# Patient Record
Sex: Female | Born: 1945 | Race: White | Hispanic: No | Marital: Married | Smoking: Former smoker
Health system: Southern US, Community
[De-identification: ages and names within clinical notes are randomized; demographics above are authoritative.]

## PROBLEM LIST (undated history)

## (undated) DIAGNOSIS — I2119 ST elevation (STEMI) myocardial infarction involving other coronary artery of inferior wall: Secondary | ICD-10-CM

## (undated) DIAGNOSIS — I252 Old myocardial infarction: Secondary | ICD-10-CM

## (undated) DIAGNOSIS — C3490 Malignant neoplasm of unspecified part of unspecified bronchus or lung: Secondary | ICD-10-CM

## (undated) DIAGNOSIS — I10 Essential (primary) hypertension: Secondary | ICD-10-CM

## (undated) DIAGNOSIS — E7800 Pure hypercholesterolemia, unspecified: Secondary | ICD-10-CM

## (undated) DIAGNOSIS — I2510 Atherosclerotic heart disease of native coronary artery without angina pectoris: Secondary | ICD-10-CM

## (undated) DIAGNOSIS — F40240 Claustrophobia: Secondary | ICD-10-CM

## (undated) DIAGNOSIS — J189 Pneumonia, unspecified organism: Secondary | ICD-10-CM

## (undated) DIAGNOSIS — K219 Gastro-esophageal reflux disease without esophagitis: Secondary | ICD-10-CM

## (undated) HISTORY — PX: BLADDER NECK SUSPENSION: SHX1240

## (undated) HISTORY — PX: LAPAROTOMY: SHX154

## (undated) HISTORY — DX: ST elevation (STEMI) myocardial infarction involving other coronary artery of inferior wall: I21.19

## (undated) HISTORY — PX: CORONARY STENT PLACEMENT: SHX1402

## (undated) HISTORY — DX: Malignant neoplasm of unspecified part of unspecified bronchus or lung: C34.90

## (undated) HISTORY — PX: APPENDECTOMY: SHX54

## (undated) HISTORY — PX: HERNIA REPAIR: SHX51

## (undated) SURGERY — Surgical Case
Anesthesia: *Unknown

---

## 2010-12-21 HISTORY — PX: HERNIA REPAIR: SHX51

## 2014-09-29 ENCOUNTER — Encounter (HOSPITAL_COMMUNITY)
Admission: EM | Disposition: A | Discharge status: Home / Self Care | Payer: Self-pay | Source: Ambulatory Visit | Attending: Cardiology

## 2014-09-29 ENCOUNTER — Inpatient Hospital Stay (HOSPITAL_COMMUNITY)
Admission: EM | Admit: 2014-09-29 | Discharge: 2014-10-02 | DRG: 247 | Disposition: A | Discharge status: Home / Self Care | Payer: Medicare HMO | Source: Ambulatory Visit | Attending: Cardiology | Admitting: Cardiology

## 2014-09-29 ENCOUNTER — Emergency Department: Payer: Self-pay | Admitting: Emergency Medicine

## 2014-09-29 DIAGNOSIS — E780 Pure hypercholesterolemia: Secondary | ICD-10-CM | POA: Diagnosis present

## 2014-09-29 DIAGNOSIS — I2119 ST elevation (STEMI) myocardial infarction involving other coronary artery of inferior wall: Secondary | ICD-10-CM | POA: Diagnosis present

## 2014-09-29 DIAGNOSIS — I2111 ST elevation (STEMI) myocardial infarction involving right coronary artery: Principal | ICD-10-CM | POA: Diagnosis present

## 2014-09-29 DIAGNOSIS — F17210 Nicotine dependence, cigarettes, uncomplicated: Secondary | ICD-10-CM | POA: Diagnosis present

## 2014-09-29 DIAGNOSIS — I2510 Atherosclerotic heart disease of native coronary artery without angina pectoris: Secondary | ICD-10-CM | POA: Diagnosis present

## 2014-09-29 DIAGNOSIS — Z955 Presence of coronary angioplasty implant and graft: Secondary | ICD-10-CM

## 2014-09-29 HISTORY — PX: LEFT HEART CATHETERIZATION WITH CORONARY ANGIOGRAM: SHX5451

## 2014-09-29 HISTORY — PX: CORONARY ANGIOPLASTY: SHX604

## 2014-09-29 SURGERY — LEFT HEART CATHETERIZATION WITH CORONARY ANGIOGRAM
Anesthesia: LOCAL

## 2014-09-30 ENCOUNTER — Encounter (HOSPITAL_COMMUNITY): Payer: Self-pay | Admitting: *Deleted

## 2014-09-30 DIAGNOSIS — I2510 Atherosclerotic heart disease of native coronary artery without angina pectoris: Secondary | ICD-10-CM | POA: Diagnosis present

## 2014-09-30 DIAGNOSIS — E780 Pure hypercholesterolemia: Secondary | ICD-10-CM | POA: Diagnosis present

## 2014-09-30 DIAGNOSIS — R079 Chest pain, unspecified: Secondary | ICD-10-CM | POA: Diagnosis present

## 2014-09-30 DIAGNOSIS — I2111 ST elevation (STEMI) myocardial infarction involving right coronary artery: Secondary | ICD-10-CM | POA: Diagnosis present

## 2014-09-30 DIAGNOSIS — I2119 ST elevation (STEMI) myocardial infarction involving other coronary artery of inferior wall: Secondary | ICD-10-CM | POA: Diagnosis present

## 2014-09-30 DIAGNOSIS — F17210 Nicotine dependence, cigarettes, uncomplicated: Secondary | ICD-10-CM | POA: Diagnosis present

## 2014-09-30 HISTORY — DX: ST elevation (STEMI) myocardial infarction involving other coronary artery of inferior wall: I21.19

## 2014-09-30 LAB — CBC
HEMATOCRIT: 36 % (ref 36.0–46.0)
HEMATOCRIT: 37.3 % (ref 36.0–46.0)
Hemoglobin: 12.3 g/dL (ref 12.0–15.0)
Hemoglobin: 12.6 g/dL (ref 12.0–15.0)
MCH: 30.3 pg (ref 26.0–34.0)
MCH: 30.4 pg (ref 26.0–34.0)
MCHC: 33.8 g/dL (ref 30.0–36.0)
MCHC: 34.2 g/dL (ref 30.0–36.0)
MCV: 89.1 fL (ref 78.0–100.0)
MCV: 89.7 fL (ref 78.0–100.0)
PLATELETS: 191 10*3/uL (ref 150–400)
Platelets: 210 10*3/uL (ref 150–400)
RBC: 4.04 MIL/uL (ref 3.87–5.11)
RBC: 4.16 MIL/uL (ref 3.87–5.11)
RDW: 13.4 % (ref 11.5–15.5)
RDW: 13.5 % (ref 11.5–15.5)
WBC: 11.3 10*3/uL — ABNORMAL HIGH (ref 4.0–10.5)
WBC: 9.6 10*3/uL (ref 4.0–10.5)

## 2014-09-30 LAB — COMPREHENSIVE METABOLIC PANEL
ALBUMIN: 3.4 g/dL — AB (ref 3.5–5.2)
ALT: 11 U/L (ref 0–35)
ALT: 9 U/L (ref 0–35)
ANION GAP: 14 (ref 5–15)
AST: 12 U/L (ref 0–37)
AST: 17 U/L (ref 0–37)
Albumin: 3.1 g/dL — ABNORMAL LOW (ref 3.5–5.2)
Alkaline Phosphatase: 81 U/L (ref 39–117)
Alkaline Phosphatase: 88 U/L (ref 39–117)
Anion gap: 16 — ABNORMAL HIGH (ref 5–15)
BUN: 13 mg/dL (ref 6–23)
BUN: 14 mg/dL (ref 6–23)
CHLORIDE: 102 meq/L (ref 96–112)
CO2: 20 meq/L (ref 19–32)
CO2: 21 meq/L (ref 19–32)
Calcium: 8.2 mg/dL — ABNORMAL LOW (ref 8.4–10.5)
Calcium: 8.5 mg/dL (ref 8.4–10.5)
Chloride: 102 mEq/L (ref 96–112)
Creatinine, Ser: 0.74 mg/dL (ref 0.50–1.10)
Creatinine, Ser: 0.72 mg/dL (ref 0.50–1.10)
GFR calc Af Amer: >90 mL/min (ref >90)
GFR calc Af Amer: >90 mL/min (ref >90)
GFR calc non Af Amer: 87 mL/min — ABNORMAL LOW (ref >90)
GFR, EST NON AFRICAN AMERICAN: 86 mL/min — AB (ref >90)
Glucose, Bld: 111 mg/dL — ABNORMAL HIGH (ref 70–99)
Glucose, Bld: 109 mg/dL — ABNORMAL HIGH (ref 70–99)
POTASSIUM: 4 meq/L (ref 3.7–5.3)
Potassium: 3.9 mEq/L (ref 3.7–5.3)
SODIUM: 138 meq/L (ref 137–147)
Sodium: 137 mEq/L (ref 137–147)
Total Protein: 6.3 g/dL (ref 6.0–8.3)
Total Protein: 6.8 g/dL (ref 6.0–8.3)

## 2014-09-30 LAB — CBC WITH DIFFERENTIAL/PLATELET
Basophils Absolute: 0 10*3/uL (ref 0.0–0.1)
Basophils Relative: 0 % (ref 0–1)
Eosinophils Absolute: 0.1 10*3/uL (ref 0.0–0.7)
Eosinophils Relative: 1 % (ref 0–5)
HEMATOCRIT: 37.8 % (ref 36.0–46.0)
HEMOGLOBIN: 12.6 g/dL (ref 12.0–15.0)
LYMPHS PCT: 21 % (ref 12–46)
Lymphs Abs: 2.3 10*3/uL (ref 0.7–4.0)
MCH: 30.6 pg (ref 26.0–34.0)
MCHC: 33.3 g/dL (ref 30.0–36.0)
MCV: 91.7 fL (ref 78.0–100.0)
MONO ABS: 0.8 10*3/uL (ref 0.1–1.0)
Monocytes Relative: 7 % (ref 3–12)
NEUTROS PCT: 71 % (ref 43–77)
Neutro Abs: 7.9 10*3/uL — ABNORMAL HIGH (ref 1.7–7.7)
Platelets: 229 10*3/uL (ref 150–400)
RBC: 4.12 MIL/uL (ref 3.87–5.11)
RDW: 13.5 % (ref 11.5–15.5)
WBC: 11.2 10*3/uL — AB (ref 4.0–10.5)

## 2014-09-30 LAB — HEMOGLOBIN A1C
Hgb A1c MFr Bld: 5.7 % — ABNORMAL HIGH (ref <5.7)
Mean Plasma Glucose: 117 mg/dL — ABNORMAL HIGH (ref <117)

## 2014-09-30 LAB — BASIC METABOLIC PANEL
Anion gap: 13 (ref 5–15)
BUN: 10 mg/dL (ref 6–23)
CHLORIDE: 107 meq/L (ref 96–112)
CO2: 19 meq/L (ref 19–32)
CREATININE: 0.64 mg/dL (ref 0.50–1.10)
Calcium: 8.3 mg/dL — ABNORMAL LOW (ref 8.4–10.5)
GFR calc Af Amer: >90 mL/min (ref >90)
GFR calc non Af Amer: >90 mL/min (ref >90)
Glucose, Bld: 100 mg/dL — ABNORMAL HIGH (ref 70–99)
Potassium: 4 mEq/L (ref 3.7–5.3)
Sodium: 139 mEq/L (ref 137–147)

## 2014-09-30 LAB — PROTIME-INR
INR: 4.94 — ABNORMAL HIGH (ref 0.00–1.49)
Prothrombin Time: 46 seconds — ABNORMAL HIGH (ref 11.6–15.2)

## 2014-09-30 LAB — LIPID PANEL
CHOL/HDL RATIO: 5.1 ratio
CHOLESTEROL: 211 mg/dL — AB (ref 0–200)
Cholesterol: 200 mg/dL (ref 0–200)
HDL: 41 mg/dL (ref >39)
HDL: 41 mg/dL (ref >39)
LDL CALC: 137 mg/dL — AB (ref 0–99)
LDL Cholesterol: 143 mg/dL — ABNORMAL HIGH (ref 0–99)
Total CHOL/HDL Ratio: 4.9 RATIO
Triglycerides: 109 mg/dL (ref <150)
Triglycerides: 137 mg/dL (ref <150)
VLDL: 22 mg/dL (ref 0–40)
VLDL: 27 mg/dL (ref 0–40)

## 2014-09-30 LAB — MRSA PCR SCREENING: MRSA BY PCR: NEGATIVE

## 2014-09-30 LAB — TSH: TSH: 1.32 u[IU]/mL (ref 0.350–4.500)

## 2014-09-30 LAB — POCT ACTIVATED CLOTTING TIME: Activated Clotting Time: 135 seconds

## 2014-09-30 LAB — TROPONIN I
TROPONIN I: 1.79 ng/mL — AB (ref <0.30)
TROPONIN I: 2.74 ng/mL — AB (ref <0.30)
Troponin I: 0.34 ng/mL (ref <0.30)
Troponin I: 1.6 ng/mL (ref <0.30)

## 2014-09-30 LAB — CK TOTAL AND CKMB (NOT AT ARMC)
CK, MB: 2.5 ng/mL (ref 0.3–4.0)
Relative Index: INVALID (ref 0.0–2.5)
Total CK: 41 U/L (ref 7–177)

## 2014-09-30 LAB — MAGNESIUM: Magnesium: 1.9 mg/dL (ref 1.5–2.5)

## 2014-09-30 MED ORDER — TICAGRELOR 90 MG PO TABS
90.0000 mg | ORAL_TABLET | Freq: Two times a day (BID) | ORAL | Status: DC
  Administered 2014-09-30 – 2014-10-02 (×5): 90 mg via ORAL
  Filled 2014-09-30 (×8): qty 1

## 2014-09-30 MED ORDER — NITROGLYCERIN IN D5W 200-5 MCG/ML-% IV SOLN: 5.0000 ug/min | INTRAVENOUS | Status: DC

## 2014-09-30 MED ORDER — ASPIRIN EC 81 MG PO TBEC: 81.0000 mg | DELAYED_RELEASE_TABLET | Freq: Every day | ORAL | Status: DC

## 2014-09-30 MED ORDER — NITROGLYCERIN 0.4 MG SL SUBL: 0.4000 mg | SUBLINGUAL_TABLET | SUBLINGUAL | Status: DC | PRN

## 2014-09-30 MED ORDER — PNEUMOCOCCAL VAC POLYVALENT 25 MCG/0.5ML IJ INJ
0.5000 mL | INJECTION | Freq: Once | INTRAMUSCULAR | Status: AC
  Administered 2014-09-30: 0.5 mL via INTRAMUSCULAR
  Filled 2014-09-30: qty 0.5

## 2014-09-30 MED ORDER — METOPROLOL TARTRATE 12.5 MG HALF TABLET
12.5000 mg | ORAL_TABLET | Freq: Two times a day (BID) | ORAL | Status: DC
  Administered 2014-09-30 – 2014-10-02 (×4): 12.5 mg via ORAL
  Filled 2014-09-30 (×7): qty 1

## 2014-09-30 MED ORDER — ATROPINE SULFATE 0.1 MG/ML IJ SOLN
INTRAMUSCULAR | Status: AC
  Filled 2014-09-30: qty 10

## 2014-09-30 MED ORDER — ACETAMINOPHEN 325 MG PO TABS: 650.0000 mg | ORAL_TABLET | ORAL | Status: DC | PRN

## 2014-09-30 MED ORDER — ONDANSETRON HCL 4 MG/2ML IJ SOLN
4.0000 mg | Freq: Four times a day (QID) | INTRAMUSCULAR | Status: DC | PRN
Start: 2014-09-30 — End: 2014-09-30

## 2014-09-30 MED ORDER — ASPIRIN 81 MG PO CHEW
81.0000 mg | CHEWABLE_TABLET | Freq: Every day | ORAL | Status: DC
Start: 2014-09-30 — End: 2014-10-02
  Administered 2014-09-30 – 2014-10-02 (×3): 81 mg via ORAL
  Filled 2014-09-30 (×3): qty 1

## 2014-09-30 MED ORDER — SODIUM CHLORIDE 0.9 % IV SOLN
INTRAVENOUS | Status: AC
  Administered 2014-09-30 (×2): via INTRAVENOUS

## 2014-09-30 MED ORDER — ONDANSETRON HCL 4 MG/2ML IJ SOLN
4.0000 mg | Freq: Four times a day (QID) | INTRAMUSCULAR | Status: DC | PRN
Start: 2014-09-30 — End: 2014-10-02

## 2014-09-30 MED ORDER — ASPIRIN 300 MG RE SUPP
300.0000 mg | RECTAL | Status: AC
  Filled 2014-09-30: qty 1

## 2014-09-30 MED ORDER — ASPIRIN 81 MG PO CHEW: 324.0000 mg | CHEWABLE_TABLET | ORAL | Status: AC

## 2014-09-30 MED ORDER — ACETAMINOPHEN 325 MG PO TABS
650.0000 mg | ORAL_TABLET | ORAL | Status: DC | PRN
  Administered 2014-09-30: 650 mg via ORAL
  Filled 2014-09-30: qty 2

## 2014-09-30 MED ORDER — PANTOPRAZOLE SODIUM 40 MG PO TBEC
40.0000 mg | DELAYED_RELEASE_TABLET | Freq: Every day | ORAL | Status: DC
Start: 2014-09-30 — End: 2014-10-02
  Administered 2014-09-30 – 2014-10-02 (×3): 40 mg via ORAL
  Filled 2014-09-30 (×3): qty 1

## 2014-09-30 MED ORDER — ATORVASTATIN CALCIUM 80 MG PO TABS
80.0000 mg | ORAL_TABLET | Freq: Every day | ORAL | Status: DC
  Administered 2014-09-30 – 2014-10-01 (×2): 80 mg via ORAL
  Filled 2014-09-30 (×3): qty 1

## 2014-09-30 NOTE — Progress Notes (Signed)
Utilization Review Completed.   Trust Crago, RN, BSN Nurse Case Manager  

## 2014-09-30 NOTE — H&P (Signed)
Kristi [name] is an 68 y.o. female.   Chief Complaint: Chest pain associated with diaphoresis HPI: Patient is 68 year old female with no significant past medical history except for tobacco abuse was transferred from [facility] ER as code STEMI  was called. Patient complained of retrosternal chest pain described as pressure grade 8/10 associated diaphoresis he done the ER showed normal sinus rhythm with ST elevation in leads 23 aVF and ST depression in lead V1 to V3 and respiratory changes in lead 1 and aVL suggestive of acute inferoposterior wall myocardial infarction. Patient denies such episodes of chest pain in the past denies any shortness of breath. Denies palpitation lightheadedness or syncope. Patient was transferred emergently [facility] Cath Lab for emergency PCI.  No past medical history on file.  No past surgical history on file.  No family history on file. Social History:  has no tobacco, alcohol, and drug history on file.  Allergies: Allergies not on file  No prescriptions prior to admission    No results found for this or any previous visit (from the past 48 hour(s)). No results found.  Review of Systems  Constitutional: Negative for fever and chills.  HENT: Negative for hearing loss.   Eyes: Negative for double vision.  Respiratory: Negative for cough, hemoptysis, sputum production and shortness of breath.   Cardiovascular: Positive for chest pain. Negative for palpitations, orthopnea, claudication and leg swelling.  Gastrointestinal: Negative for nausea, vomiting and abdominal pain.  Genitourinary: Negative for dysuria.  Neurological: Negative for dizziness and headaches.    There were no vitals taken for this visit. Physical Exam  Constitutional: She is oriented to person, place, and time.  HENT:  Head: Normocephalic and atraumatic.  Eyes: Conjunctivae are normal. Pupils are equal, round, and reactive to light. Left eye exhibits no  discharge. No scleral icterus.  Neck: Normal range of motion. Neck supple. No JVD present. No tracheal deviation present. No thyromegaly present.  Cardiovascular: Normal rate and regular rhythm.   Murmur (soft systolic murmur and S4 gallop noted) heard. Respiratory: Effort normal and breath sounds normal. No respiratory distress. She has no wheezes. She has no rales.  GI: Soft. Bowel sounds are normal. She exhibits no distension. There is no tenderness.  Musculoskeletal: She exhibits no edema and no tenderness.  Neurological: She is alert and oriented to person, place, and time.     Assessment/Plan Acute inferoposterior wall myocardial infarction Tobacco abuse Plan Discussed with patient briefly regarding emergency left cath possible PTCA stenting this risk and benefits and consents for PCI  [facility] N 09/30/2014, 12:39 AM

## 2014-09-30 NOTE — Cardiovascular Report (Signed)
NAME:  Mccaffery, Nadezhda              ACCOUNT NO.:  [id number]  MEDICAL RECORD NO.:  [id number]  LOCATION:  2H05C                        FACILITY:  [facility]  PHYSICIAN:  Carmon Brigandi N. Terrence Dupont, M.D. DATE OF BIRTH:  [birth year]  DATE OF PROCEDURE:  09/28/2014 DATE OF DISCHARGE:                           CARDIAC CATHETERIZATION   PROCEDURE: 1. Left cardiac catheterization with selective left and right coronary     angiography, left ventriculography via right groin using [place]     technique. 2. Successful percutaneous transluminal coronary angioplasty to distal     right coronary artery using 2.5 x 8 mm long Emerge balloon for     predilatation. 3. Successful deployment of 2.75 x 15 mm long Xience Alpine drug-     eluting stent in distal right coronary artery. 4. Successful postdilatation of this stent using 2.75 x 12 mm long [place]     Emerge balloon.  INDICATION FOR THE PROCEDURE:  Ms. Kristi [name] is a 68 year old female with no significant past medical history except for tobacco abuse who was transferred from [facility] ER as code STEMI was called. The patient complained of retrosternal chest pain described as pressure, grade 8/10 associated with diaphoresis around 10:15 p.m.  EKG done in the ER showed normal sinus rhythm with ST elevation in leads 2, 3, aVF and ST depression in V1 to V3, and reciprocal changes in lead 1 and aVL suggestive of acute inferoposterior wall myocardial infarction.  The patient denies such episodes of chest pain in the past.  Denies any shortness of breath.  Denies palpitation, lightheadedness, or syncope. The patient was transferred emergently to [facility] Cath Lab for emergency PCI.  DESCRIPTION OF PROCEDURE:  After obtaining the informed consent, the patient was brought to the cath lab and was placed on fluoroscopy table. Right groin was prepped and draped in usual fashion.  A 1% Xylocaine was used for local anesthesia in the right groin.   With the help of thin wall needle, 6-French arterial sheath was placed.  The sheath was aspirated and flushed.  Next, 6-French left [place] catheter was advanced over the wire under fluoroscopic guidance up to the ascending aorta.  Wire was pulled out.  The catheter was aspirated and connected to the Manifold.  Catheter was further advanced and engaged into left coronary ostium.  Multiple views of the left system were taken.  Next, catheter was disengaged and was pulled out over the wire and was replaced with a 6-French right [place] catheter, which was advanced over the wire under fluoroscopic guidance up to the ascending aorta.  Wire was pulled out.  The catheter was aspirated and connected to the Manifold.  Catheter was further advanced and engaged into right coronary ostium.  Multiple views of the right system were taken.  Next, the catheter was disengaged and was pulled out over the wire and was replaced with 6-French pigtail catheter at the end of the procedure, which was advanced over the wire under fluoroscopic guidance up to the ascending aorta.  Catheter was further advanced across the aortic valve into the LV.  LV pressures were recorded.  Next, LV graphy was done in 30-degree RAO position.  Post-angiographic pressures were  recorded from LV and then pullback pressures were recorded from the aorta.  There was no gradient across the aortic valve.  Next, the pigtail catheter was pulled out over the wire.  Sheaths were aspirated and flushed.  FINDINGS:  LV showed mild inferior mid wall hypokinesia, EF of 55-60%. Left main was patent.  LAD has 10-15% proximal and mid stenosis. Diagonal 1 has 30-35% proximal stenosis.  Left circumflex has 60-70% mid focal stenosis and 20%-30% distal stenosis.  OM 1 is small which is patent.  RCA has 40-50% mid stenosis.  Vessel is calcified and has 99% distal stenosis with haziness with TIMI grade 3 distal flow which is the culprit lesion for her MI  just prior to the bifurcation with PDA.  PDA and PLV branches were small which has mild disease.  INTERVENTIONAL PROCEDURE:  Successful PTCA to distal RCA was done using initial 2.5 x 8 mm long Emerge balloon.  Two inflations were done going up to 8 atmospheric pressure for predilatation and then 2.75 x 15 mm long Xience Alpine drug-eluting stent was deployed at 11 atmospheric pressure in distal RCA.  The stent was post dilated using 2.75 x 12 mm long [place] Emerge balloon going up to 18 atmospheric pressure.  Lesion dilated from 99% to 0% residual with excellent TIMI grade 3 distal flow without evidence of dissection or distal embolization.  The patient received weight based Angiomax and 180 mg of Brilinta prior to the procedure.  The patient tolerated the procedure well.  There were no complications.  The patient was transferred to CCU in stable condition.     Allegra Lai. Terrence Dupont, M.D.     MNH/MEDQ  D:  09/30/2014  T:  09/30/2014  Job:  [id number]

## 2014-09-30 NOTE — Progress Notes (Signed)
Pt c/o pain to iv access, site checked soft and sl. tender to touch. Attempt to restart new iv access futile x1.Pt stated she did not want to have another iv access put in. On call MD notified, stated to decrease ivf to 30 and continue for 12 hours as previously written. IVF turned off at 1315 and pt saline locked.  Pt informed of order to transfer to telemetry. Pt verbalized understanding, awaiting room assignment. Will cont to monitor closely.

## 2014-09-30 NOTE — Progress Notes (Signed)
CRITICAL VALUE ALERT  Critical value received:  Troponin 0.34  Date of notification:  09/30/14  Time of notification:  0149  Critical value read back:Yes.    Nurse who received alert:  Chantele Corado A   Pt. Code stemi, went to cath lab. Dr. Terrence Dupont placed stent, aware of pt. Condition.

## 2014-09-30 NOTE — CV Procedure (Signed)
Left cardiac cath/PTCA stenting report dictated on 09/30/2014 dictation number is [id number]

## 2014-09-30 NOTE — Progress Notes (Signed)
R femoral arterial sheath pull started at 0342. Manual pressure held for 30 minutes, pt tolerated procedure with out any adverse events.  Vital signs remained stable.  Groin site level 0, pressure dressing applied and pt given post sheath pull education.

## 2014-09-30 NOTE — Progress Notes (Signed)
CRITICAL VALUE ALERT  Critical value received:  ptt > 200  Date of notification:  09/30/14  Time of notification:  0208  Critical value read back:Yes.    Nurse who received alert:  Deaundra Dupriest A  Pt. Code stemi, went to cath lab. Dr. Terrence Dupont placed stent, aware of pt. Condition. Expected value

## 2014-09-30 NOTE — Progress Notes (Signed)
Rm [id number] available, pt informed. Report called to receiving RN. Pt's belongings packed. Pt transported via wheelchair with belongings and husband in tow. [place] notified. Will monitor.

## 2014-09-30 NOTE — Progress Notes (Signed)
[place] Progress Note Patient Name: Kristi [name] DOB: 03/03/1946 MRN: [id number]   Date of Service  09/30/2014  HPI/Events of Note  Inferior MI, S/P LHC with PCI distal RCA Stable on camera check  eICU Interventions  No intervention needed Will monitor     Intervention Category Evaluation Type: New Patient Evaluation  [name] 09/30/2014, 2:01 AM

## 2014-09-30 NOTE — Progress Notes (Signed)
Chaplain responded to code stemi. Chaplain escorted pt husband to [facility] waiting area and informed medical team of his location. Pt husband explained that they are from [place] and have been staying with their son in [place]. He said "it has been a long month." Chaplain will follow as needed.  09/30/14 0000  Clinical Encounter Type  Visited With Family;Health care provider  Visit Type Initial;Spiritual support  Referral From Nurse  Stress Factors  Family Stress Factors Major life changes  Telly Broberg, Barbette Hair, Chaplain 09/30/2014 12:23 AM

## 2014-09-30 NOTE — Progress Notes (Signed)
Ref: No primary provider on file.   Subjective:  Feeling better. No chest pain.  Objective:  Vital Signs in the last 24 hours: Temp:  [97.6 F (36.4 C)-98.3 F (36.8 C)] 97.6 F (36.4 C) (10/11 1200) Pulse Rate:  [60-106] 64 (10/11 1200) Cardiac Rhythm:  [-] Normal sinus rhythm (10/11 1200) Resp:  [11-25] 15 (10/11 1200) BP: (82-153)/(39-85) 110/57 mmHg (10/11 1200) SpO2:  [90 %-99 %] 99 % (10/11 1200) Weight:  [81.5 kg (179 lb 10.8 oz)] 81.5 kg (179 lb 10.8 oz) (10/11 0100)  Physical Exam: BP Readings from Last 1 Encounters:  09/30/14 110/57    Wt Readings from Last 1 Encounters:  09/30/14 81.5 kg (179 lb 10.8 oz)    Weight change:   HEENT: [place]/AT, Eyes- PERL, EOMI, Conjunctiva-Pink, Sclera-Non-icteric Neck: No JVD, No bruit, Trachea midline. Lungs:  Clear, Bilateral. Cardiac:  Regular rhythm, normal S1 and S2, no S3.  Abdomen:  Soft, non-tender. Extremities:  No edema present. No cyanosis. No clubbing. No right groin hematoma. CNS: AxOx3, Cranial nerves grossly intact, moves all 4 extremities.  Skin: Warm and dry.   Intake/Output from previous day: 10/10 0701 - 10/11 0700 In: 850 [I.V.:600] Out: -     Lab Results: BMET    Component Value Date/Time   NA 139 09/30/2014 0705   NA 138 09/30/2014 0244   NA 137 09/30/2014 0032   K 4.0 09/30/2014 0705   K 4.0 09/30/2014 0244   K 3.9 09/30/2014 0032   CL 107 09/30/2014 0705   CL 102 09/30/2014 0244   CL 102 09/30/2014 0032   CO2 19 09/30/2014 0705   CO2 20 09/30/2014 0244   CO2 21 09/30/2014 0032   GLUCOSE 100* 09/30/2014 0705   GLUCOSE 109* 09/30/2014 0244   GLUCOSE 111* 09/30/2014 0032   BUN 10 09/30/2014 0705   BUN 13 09/30/2014 0244   BUN 14 09/30/2014 0032   CREATININE 0.64 09/30/2014 0705   CREATININE 0.72 09/30/2014 0244   CREATININE 0.74 09/30/2014 0032   CALCIUM 8.3* 09/30/2014 0705   CALCIUM 8.5 09/30/2014 0244   CALCIUM 8.2* 09/30/2014 0032   GFRNONAA >90 09/30/2014 0705   GFRNONAA 87*  09/30/2014 0244   GFRNONAA 86* 09/30/2014 0032   GFRAA >90 09/30/2014 0705   GFRAA >90 09/30/2014 0244   GFRAA >90 09/30/2014 0032   CBC    Component Value Date/Time   WBC 9.6 09/30/2014 0705   RBC 4.16 09/30/2014 0705   HGB 12.6 09/30/2014 0705   HCT 37.3 09/30/2014 0705   PLT 191 09/30/2014 0705   MCV 89.7 09/30/2014 0705   MCH 30.3 09/30/2014 0705   MCHC 33.8 09/30/2014 0705   RDW 13.5 09/30/2014 0705   LYMPHSABS 2.3 09/30/2014 0244   MONOABS 0.8 09/30/2014 0244   EOSABS 0.1 09/30/2014 0244   BASOSABS 0.0 09/30/2014 0244   HEPATIC Function Panel  Recent Labs  09/30/14 0032 09/30/14 0244  PROT 6.3 6.8   HEMOGLOBIN A1C No components found with this basename: HGA1C,  MPG   CARDIAC ENZYMES Lab Results  Component Value Date   CKTOTAL 41 09/30/2014   CKMB 2.5 09/30/2014   TROPONINI 1.79* 09/30/2014   TROPONINI 1.60* 09/30/2014   TROPONINI 0.34* 09/30/2014   BNP No results found for this basename: PROBNP,  in the last 8760 hours TSH  Recent Labs  09/30/14 0244  TSH 1.320   CHOLESTEROL  Recent Labs  09/30/14 0032 09/30/14 0244  CHOL 200 211*    Scheduled Meds: . aspirin  324 mg Oral NOW   Or  . aspirin  300 mg Rectal NOW  . aspirin  81 mg Oral Daily  . atorvastatin  80 mg Oral q1800  . metoprolol tartrate  12.5 mg Oral BID  . pantoprazole  40 mg Oral Q0600  . ticagrelor  90 mg Oral BID   Continuous Infusions: . nitroGLYCERIN     PRN Meds:.acetaminophen, nitroGLYCERIN, ondansetron (ZOFRAN) IV  Assessment/Plan: Acute inferior-posterior wall MI Tobacco use disorder CAD S/P RCA Stent    Transfer to Telebed   LOS: 1 day    Dixie Dials  MD  09/30/2014, 1:03 PM

## 2014-10-01 LAB — POCT I-STAT, CHEM 8
BUN: 13 mg/dL (ref 6–23)
CALCIUM ION: 1.14 mmol/L (ref 1.13–1.30)
Chloride: 105 mEq/L (ref 96–112)
Creatinine, Ser: 0.7 mg/dL (ref 0.50–1.10)
Glucose, Bld: 113 mg/dL — ABNORMAL HIGH (ref 70–99)
HCT: 35 % — ABNORMAL LOW (ref 36.0–46.0)
HEMOGLOBIN: 11.9 g/dL — AB (ref 12.0–15.0)
Potassium: 3.6 mEq/L — ABNORMAL LOW (ref 3.7–5.3)
Sodium: 139 mEq/L (ref 137–147)
TCO2: 21 mmol/L (ref 0–100)

## 2014-10-01 LAB — APTT: aPTT: >200 seconds (ref 24–37)

## 2014-10-01 NOTE — Care Management Note (Addendum)
    Page 1 of 2   10/02/2014     11:10:30 AM CARE MANAGEMENT NOTE 10/02/2014  Patient:  Kristi [name],Kristi [name]   Account Number:  [id number]  Date Initiated:  10/01/2014  Documentation initiated by:  GRAVES-BIGELOW,Vera Wishart  Subjective/Objective Assessment:   Pt admitted for stemi. Plan for d/c on Brilinta.     Action/Plan:   Pt was provided 30 day free card. CM did call [facility] in [place] [facility]d medication is available. [facility] checked co pay: insurance rejected to pay and cost will be 350.00. CM to ask CMA to do benefits check.   Anticipated DC Date:  10/01/2014   Anticipated DC Plan:  [place]  CM consult  Medication Assistance  PCP issues      Choice offered to / List presented to:             Status of service:  Completed, signed off Medicare Important Message given?  YES (If response is "NO", the following Medicare IM given date fields will be blank) Date Medicare IM given:  10/02/2014 Medicare IM given by:  GRAVES-BIGELOW,Younique Casad Date Additional Medicare IM given:   Additional Medicare IM given by:    Discharge Disposition:  HOME/SELF CARE  Per UR Regulation:  Reviewed for med. necessity/level of care/duration of stay  If discussed at [place] of Stay Meetings, dates discussed:    Comments:  S/W JACQUELINE @ [place] # [phone]  ** TRICAGRELOR: NOT ON FORMULARY **  BRILINTA : 90 MG TABLET  BID  COVER-YES CO-PAY- $ 11.45  30 DAY SUPPLY TIER:  NO PRIOR APPROVAL : NO PHARMACY: [facility]  OR [facility]  * * MAIL ORDER FOR [employer] * *  FOR 90 DAY SUPPLY $ 34.36

## 2014-10-01 NOTE — Progress Notes (Addendum)
CARDIAC REHAB PHASE I   PRE:  Rate/Rhythm: 86 SR  BP:  Supine:   Sitting: 114/52  Standing:    SaO2: 97 RA  MODE:  Ambulation: 950 ft   POST:  Rate/Rhythm: 95  BP:  Supine:   Sitting: 110/64  Standing:    SaO2: 97 RA 0815-0950 Pt tolerated ambulation well without c/o of cp or SOB. VS stable. Pt to side of bed after walk with call light. Completed MI and stent education with pt. She voices understanding. Pt agrees to [place]. [facility] in [place], will send referral. We discussed smoking cessation, she wants to quit and has the desire to. Pt is still working on her plan to stop. I gave pt coaching contact number, quit smart class information and tips for quitting. I placed video "MI a way forward" on for her to watch. Pt seems committed to making lifestyle changes. Pt's only c/o this morning is clod symptoms she thinks that came from taking the pneumonia shot.   Rodney Langton RN 10/01/2014 9:51 AM

## 2014-10-01 NOTE — Progress Notes (Signed)
Subjective:  Patient denies any chest pain or shortness of breath. Denies palpitation lightheadedness or syncope.  Objective:  Vital Signs in the last 24 hours: Temp:  [98.1 F (36.7 C)-98.5 F (36.9 C)] 98.5 F (36.9 C) (10/12 0507) Pulse Rate:  [62-71] 71 (10/12 0507) Resp:  [10-18] 18 (10/12 0507) BP: (98-155)/(48-83) 98/70 mmHg (10/12 0507) SpO2:  [90 %-99 %] 96 % (10/12 0507)  Intake/Output from previous day: 10/11 0701 - 10/12 0700 In: 2582 [P.O.:1610; I.V.:972] Out: 950 [Urine:950] Intake/Output from this shift:    Physical Exam: Neck: no adenopathy, no carotid bruit, no JVD and supple, symmetrical, trachea midline Lungs: clear to auscultation bilaterally Heart: regular rate and rhythm, S1, S2 normal and Soft systolic murmur noted no S3 gallop Abdomen: soft, non-tender; bowel sounds normal; no masses,  no organomegaly Extremities: extremities normal, atraumatic, no cyanosis or edema and Right groin stable Pulses: 2+ and symmetric  Lab Results:  Recent Labs  09/30/14 0244 09/30/14 0705  WBC 11.2* 9.6  HGB 12.6 12.6  PLT 229 191    Recent Labs  09/30/14 0244 09/30/14 0705  NA 138 139  K 4.0 4.0  CL 102 107  CO2 20 19  GLUCOSE 109* 100*  BUN 13 10  CREATININE 0.72 0.64    Recent Labs  09/30/14 0705 09/30/14 1245  TROPONINI 1.79* 2.74*   Hepatic Function Panel  Recent Labs  09/30/14 0244  PROT 6.8  ALBUMIN 3.4*  AST 17  ALT 11  ALKPHOS 88  BILITOT <0.2*    Recent Labs  09/30/14 0244  CHOL 211*   No results found for this basename: PROTIME,  in the last 72 hours  Imaging: Imaging results have been reviewed and No results found.  Cardiac Studies:  Assessment/Plan:  Status post acute inferoposterolateral infarction status post PCI to distal RCA with excellent results Tobacco abuse Hypercholesteremia Plan Continue present management Check labs in a.m. Increase ambulation as tolerated  LOS: 2 days    Kristi [name]  N 10/01/2014, 12:56 PM

## 2014-10-02 LAB — CBC
HCT: 38.1 % (ref 36.0–46.0)
HEMOGLOBIN: 12.9 g/dL (ref 12.0–15.0)
MCH: 30.1 pg (ref 26.0–34.0)
MCHC: 33.9 g/dL (ref 30.0–36.0)
MCV: 89 fL (ref 78.0–100.0)
Platelets: 232 10*3/uL (ref 150–400)
RBC: 4.28 MIL/uL (ref 3.87–5.11)
RDW: 13.6 % (ref 11.5–15.5)
WBC: 11.1 10*3/uL — ABNORMAL HIGH (ref 4.0–10.5)

## 2014-10-02 LAB — BASIC METABOLIC PANEL
Anion gap: 15 (ref 5–15)
BUN: 12 mg/dL (ref 6–23)
CO2: 22 mEq/L (ref 19–32)
Calcium: 9.3 mg/dL (ref 8.4–10.5)
Chloride: 104 mEq/L (ref 96–112)
Creatinine, Ser: 0.66 mg/dL (ref 0.50–1.10)
GFR, EST NON AFRICAN AMERICAN: 89 mL/min — AB (ref >90)
Glucose, Bld: 92 mg/dL (ref 70–99)
Potassium: 3.6 mEq/L — ABNORMAL LOW (ref 3.7–5.3)
SODIUM: 141 meq/L (ref 137–147)

## 2014-10-02 LAB — TROPONIN I: TROPONIN I: 0.87 ng/mL — AB (ref <0.30)

## 2014-10-02 MED ORDER — NITROGLYCERIN 0.4 MG SL SUBL: 0.4000 mg | SUBLINGUAL_TABLET | SUBLINGUAL | Status: DC | PRN

## 2014-10-02 MED ORDER — ASPIRIN 81 MG PO CHEW: 81.0000 mg | CHEWABLE_TABLET | Freq: Every day | ORAL | Status: DC

## 2014-10-02 MED ORDER — TICAGRELOR 90 MG PO TABS: 90.0000 mg | ORAL_TABLET | Freq: Two times a day (BID) | ORAL | Status: DC

## 2014-10-02 MED ORDER — METOPROLOL TARTRATE 12.5 MG HALF TABLET: 12.5000 mg | ORAL_TABLET | Freq: Two times a day (BID) | ORAL | Status: DC

## 2014-10-02 MED ORDER — ATORVASTATIN CALCIUM 80 MG PO TABS: 80.0000 mg | ORAL_TABLET | Freq: Every day | ORAL | Status: DC

## 2014-10-02 NOTE — Progress Notes (Signed)
Patient verbalize understanding of discharge instructions. All belongings returned to patient. Patient discharged to home with spouse. VSS

## 2014-10-02 NOTE — Discharge Summary (Signed)
NAME:  Kristi [name], Kristi [name]              ACCOUNT NO.:  [id number]  MEDICAL RECORD NO.:  [id number]  LOCATION:  3W23C                        FACILITY:  [facility]  PHYSICIAN:  Derrill Bagnell N. Terrence Dupont, M.D. DATE OF BIRTH:  1946/07/04  DATE OF ADMISSION:  09/29/2014 DATE OF DISCHARGE:  10/02/2014                              DISCHARGE SUMMARY   ADMITTING DIAGNOSES: 1. Acute inferoposterior wall myocardial infarction. 2. Tobacco abuse.  DISCHARGE DIAGNOSES: 1. Status post acute inferoposterior wall myocardial infarction status     post PCI to distal RCA. 2. Tobacco abuse. 3. Hypercholesteremia.  DISCHARGE HOME MEDICATIONS: 1. Aspirin 81 mg 1 tablet daily. 2. Brilinta 90 mg twice daily. 3. Atorvastatin 80 mg 1 tablet daily. 4. Metoprolol tartrate 12.5 mg twice daily. 5. Nitrostat 0.4 mg sublingual use as directed.  DIET:  Low salt, low cholesterol.  ACTIVITY:  Increase activity slowly as tolerated.  Post cardiac cath/PTCA stent instructions have been given.  The patient will be scheduled for phase 2 cardiac rehab as outpatient.  CONDITION AT DISCHARGE:  Stable.  BRIEF HISTORY:  Ms. Kristi [name] is a 68 year old female with no significant past medical history except for tobacco abuse who was transferred from [facility] ER as code STEMI was called.  The patient complained of retrosternal chest pain described as pressure, grade 8/10, associated with diaphoresis.  EKG done in the ER showed normal sinus rhythm with ST elevation in lead II, III, aVF, and ST depression in lead V1 to V3, and reciprocal changes in lead I and aVL, suggestive of acute inferoposterior wall myocardial infarction.  The patient denies such episodes of chest pain in the past.  Denies any shortness of breath. Denies palpitation, lightheadedness, or syncope.  The patient was transferred emergently to [facility] for emergency PCI.  PAST MEDICAL HISTORY:  As above.  PHYSICAL EXAMINATION:  GENERAL:  She was  alert, awake, oriented x3. Hemodynamically stable.  Conjunctivae was pink. NECK:  Supple.  No JVD.  No bruit. LUNGS:  Clear to auscultation without rhonchi or rales. CARDIOVASCULAR:  S1, S2 was normal.  There was soft systolic murmur and S4 gallop. ABDOMEN:  Soft.  Bowel sounds were present.  Nontender. EXTREMITIES:  There was no clubbing, cyanosis, or edema. NEUROLOGIC:  Grossly intact.  LABORATORY DATA:  Sodium was 139, potassium 3.6, repeat potassium was 3.9, BUN 13, creatinine 0.70, glucose 113, fasting repeat blood sugar today is 92.  Her cholesterol was 200, triglycerides 109, HDL 41, LDL was 137.  Hemoglobin was 12.3, hematocrit 36, white count of 11.3. Today, hemoglobin is 12.9, hematocrit 38.1, white count of 11.1 which has been stable.  Troponin I first set was 0.034, repeat was 1.60, post PCI was 1.79 and 2.74.  This morning, troponin I is 0.87 which is trending down.  EKG done on October 11 showed evolving inferoposterior wall myocardial infarction.  BRIEF HOSPITAL COURSE:  The patient was emergently transferred from [facility] ER to the [facility] and subsequently underwent emergency left cath and PTCA stenting to distal RCA as per procedure report with excellent results.  The patient did not have any further episodes of chest pain during the hospital stay.  Her  groin is stable with no evidence of hematoma or bruit.  The patient did not have any cardiac arrhythmias during the hospital stay.  The patient's phase 1 cardiac rehab was called.  The patient has been ambulating in room and hallway without any problems.  The patient will be discharged home on above medications and will be followed up in my office in 1 week.     Allegra Lai. Terrence Dupont, M.D.     MNH/MEDQ  D:  10/02/2014  T:  10/02/2014  Job:  [id number]

## 2014-10-02 NOTE — Discharge Summary (Signed)
  Discharge summary dictated on 10/02/2014 dictation number is [id number]

## 2014-10-02 NOTE — Discharge Instructions (Signed)
Acute Coronary Syndrome Acute coronary syndrome (ACS) is an urgent problem in which the blood and oxygen supply to the heart is critically deficient. ACS requires hospitalization because one or more coronary arteries may be blocked. ACS represents a range of conditions including:  Previous angina that is now unstable, lasts longer, happens at rest, or is more intense.  A heart attack, with heart muscle cell injury and death. There are three vital coronary arteries that supply the heart muscle with blood and oxygen so that it can pump blood effectively. If blockages to these arteries develop, blood flow to the heart muscle is reduced. If the heart does not get enough blood, angina may occur as the first warning sign. SYMPTOMS   The most common signs of angina include:  Tightness or squeezing in the chest.  Feeling of heaviness on the chest.  Discomfort in the arms, neck, back, or jaw.  Shortness of breath and nausea.  Cold, wet skin.  Angina is usually brought on by physical effort or excitement which increase the oxygen needs of the heart. These states increase the blood flow needs of the heart beyond what can be delivered.  Other symptoms that are not as common include:  Fatigue  Unexplained feelings of nervousness or anxiety  Weakness  Diarrhea  Sometimes, you may not have noticed any symptoms at all but still suffered a cardiac injury. TREATMENT   Medicines to help discomfort may include nitroglycerin (nitro) in the form of tablets or a spray for rapid relief, or longer-acting forms such as cream, patches, or capsules. (Be aware that there are many side effects and possible interactions with other drugs).  Other medicines may be used to help the heart pump better.  Procedures to open blocked arteries including angioplasty or stent placement to keep the arteries open.  Open heart surgery may be needed when there are many blockages or they are in critical locations that  are best treated with surgery. HOME CARE INSTRUCTIONS   Do not use any tobacco products including cigarettes, chewing tobacco, or electronic cigarettes.  Take one baby or adult aspirin daily, if your health care provider advises. This helps reduce the risk of a heart attack.  It is very important that you follow the angina treatment prescribed by your health care provider. Make arrangements for proper follow-up care.  Eat a heart healthy diet with salt and fat restrictions as advised.  Regular exercise is good for you as long as it does not cause discomfort. Do not begin any new type of exercise until you check with your health care provider.  If you are overweight, you should lose weight.  Try to maintain normal blood lipid levels.  Keep your blood pressure under control as recommended by your health care provider.  You should tell your health care provider right away about any increase in the severity or frequency of your chest discomfort or angina attacks. When you have angina, you should stop what you are doing and sit down. This may bring relief in 3 to 5 minutes. If your health care provider has prescribed nitro, take it as directed.  If your health care provider has given you a follow-up appointment, it is very important to keep that appointment. Not keeping the appointment could result in a chronic or permanent injury, pain, and disability. If there is any problem keeping the appointment, you must call back to this facility for assistance. SEEK IMMEDIATE MEDICAL CARE IF:   You develop nausea, vomiting, or shortness  of breath.  You feel faint, lightheaded, or pass out.  Your chest discomfort gets worse.  You are sweating or experience sudden profound fatigue.  You do not get relief of your chest pain after 3 doses of nitro.  Your discomfort lasts longer than 15 minutes. MAKE SURE YOU:   Understand these instructions.  Will watch your condition.  Will get help right  away if you are not doing well or get worse.  Take all medicines as directed by your health care provider. Document Released: 12/07/2005 Document Revised: 12/12/2013 Document Reviewed: 04/10/2014 [facility] Patient Information 2015 [place], [place]. This information is not intended to replace advice given to you by your health care provider. Make sure you discuss any questions you have with your health care provider. Coronary Angiogram with Stent Coronary angiography with stent placement is a procedure to widen or open a narrow blood vessel of the heart (coronary artery). When a coronary artery becomes partially blocked, it decreases blood flow to that area. This may lead to chest pain or a heart attack (myocardial infarction). Arteries may become blocked by cholesterol buildup (plaque) in the lining or wall.  A stent is a small piece of metal that looks like a mesh or a spring. Stent placement may be done right after a coronary angiography in which a blocked artery is found or as a treatment for a heart attack.  LET [facility] CARE PROVIDER KNOW ABOUT:  Any allergies you have.   All medicines you are taking, including vitamins, herbs, eye drops, creams, and over-the-counter medicines.   Previous problems you or members of your family have had with the use of anesthetics.   Any blood disorders you have.   Previous surgeries you have had.   Medical conditions you have. RISKS AND COMPLICATIONS Generally, coronary angiography with stent is a safe procedure. However, problems can occur and include:  Damage to the heart or its blood vessels.   A return of blockage.   Bleeding, infection, or bruising at the insertion site.   A collection of blood under the skin (hematoma) at the insertion site.  Blood clot in another part of the body.   Kidney injury.   Allergic reaction to the dye or contrast used.   Bleeding into the abdomen (retroperitoneal bleeding). BEFORE THE  PROCEDURE  Do not eat or drink anything after midnight on the night before the procedure or as directed by your health care provider.  Ask your health care provider about changing or stopping your regular medicines. This is especially important if you are taking diabetes medicines or blood thinners.  Your health care provider will make sure you understand the procedure as well as the risks and potential problems associated with the procedure.  PROCEDURE  You may be given a medicine to help you relax before and during the procedure (sedative). This medicine will be given through an IV tube that is put into one of your veins.   The area where the catheter will be inserted will be shaved and cleaned. This is usually done in the groin but may be done in the fold of your arm (near your elbow) or in the wrist.   A medicine will be given to numb the area where the catheter will be inserted (local anesthetic).   The catheter will be inserted into an artery using a guide wire. A type of X-ray (fluoroscopy) will be used to help guide the catheter to the opening of the blocked artery.   A  dye will then be injected into the catheter, and X-rays will be taken. The dye will help to show where any narrowing or blockages are located in the heart arteries.   A tiny wire will be guided to the blocked spot, and a balloon will be inflated to make the artery wider. The stent will be expanded and will crush the plaque into the wall of the vessel. The stent will hold the area open like a scaffolding and improve the blood flow.   Sometimes the artery may be made wider using a laser or other tools to remove plaque.   When the blood flow is better, the catheter will be removed. The lining of the artery will grow over the stent, which stays where it was placed.  AFTER THE PROCEDURE  If the procedure is done through the leg, you will be kept in bed lying flat for about 6 hours. You will be instructed to not  bend or cross your legs.   The insertion site will be checked frequently.   The pulse in your feet or wrist will be checked frequently.   Additional blood tests, X-rays, and electrocardiography may be done. Document Released: 06/13/2003 Document Revised: 04/23/2014 Document Reviewed: 06/15/2013 [facility] Patient Information 2015 [place], [place]. This information is not intended to replace advice given to you by your health care provider. Make sure you discuss any questions you have with your health care provider. Femoral Site Care Refer to this sheet in the next few weeks. These instructions provide you with information on caring for yourself after your procedure. Your caregiver may also give you more specific instructions. Your treatment has been planned according to current medical practices, but problems sometimes occur. Call your caregiver if you have any problems or questions after your procedure. HOME CARE INSTRUCTIONS  You may shower the day after the procedure.Remove the bandage (dressing) and gently wash the site with plain soap and water.Gently pat the site dry.  Do not apply powder or lotion to the site.  Do not submerge the affected site in water for 3 to 5 days.  Inspect the site at least twice daily.  Do not flex or bend the affected leg for 24 hours.  No lifting over 5 pounds (2.3 kg) for 5 days after your procedure.  Do not drive home if you are discharged the same day of the procedure. Have someone else drive you.  You may drive 24 hours after the procedure unless otherwise instructed by your caregiver.  Do not operate machinery or power tools for 24 hours.  A responsible adult should be with you for the first 24 hours after you arrive home. What to expect:  Any bruising will usually fade within 1 to 2 weeks.  Blood that collects in the tissue (hematoma) may be painful to the touch. It should usually decrease in size and tenderness within 1 to 2 weeks. SEEK  IMMEDIATE MEDICAL CARE IF:  You have unusual pain at the Femoral site.  You have redness, warmth, swelling, or pain at the Femoral site.  You have drainage (other than a small amount of blood on the dressing).  You have chills.  You have a fever or persistent symptoms for more than 72 hours.  You have a fever and your symptoms suddenly get worse.  Your arm becomes pale, cool, tingly, or numb.  You have heavy bleeding from the site. Hold pressure on the site. Document Released: 01/09/2011 Document Revised: 02/29/2012 Document Reviewed: 01/09/2011 [employer] Patient Information  2015 [employer]. This information is not intended to replace advice given to you by your health care provider. Make sure you discuss any questions you have with your health care provider.

## 2014-11-29 ENCOUNTER — Encounter (HOSPITAL_COMMUNITY): Payer: Self-pay | Admitting: Cardiology

## 2015-01-14 DIAGNOSIS — I10 Essential (primary) hypertension: Secondary | ICD-10-CM | POA: Diagnosis not present

## 2015-01-14 DIAGNOSIS — F17290 Nicotine dependence, other tobacco product, uncomplicated: Secondary | ICD-10-CM | POA: Diagnosis not present

## 2015-01-14 DIAGNOSIS — I252 Old myocardial infarction: Secondary | ICD-10-CM | POA: Diagnosis not present

## 2015-01-14 DIAGNOSIS — I2510 Atherosclerotic heart disease of native coronary artery without angina pectoris: Secondary | ICD-10-CM | POA: Diagnosis not present

## 2015-01-14 DIAGNOSIS — E785 Hyperlipidemia, unspecified: Secondary | ICD-10-CM | POA: Diagnosis not present

## 2015-03-25 ENCOUNTER — Other Ambulatory Visit (HOSPITAL_COMMUNITY)
Admission: AD | Admit: 2015-03-25 | Discharge: 2015-03-25 | Disposition: A | Discharge status: Home / Self Care | Payer: Medicare Other | Source: Ambulatory Visit | Attending: Cardiology | Admitting: Cardiology

## 2015-03-25 DIAGNOSIS — I2119 ST elevation (STEMI) myocardial infarction involving other coronary artery of inferior wall: Secondary | ICD-10-CM | POA: Insufficient documentation

## 2015-03-25 LAB — LIPID PANEL
CHOL/HDL RATIO: 3.1 ratio
Cholesterol: 160 mg/dL (ref 0–200)
HDL: 52 mg/dL (ref >39)
LDL Cholesterol: 90 mg/dL (ref 0–99)
Triglycerides: 91 mg/dL (ref <150)
VLDL: 18 mg/dL (ref 0–40)

## 2015-03-25 LAB — HEPATIC FUNCTION PANEL
ALBUMIN: 3.8 g/dL (ref 3.5–5.2)
ALK PHOS: 147 U/L — AB (ref 39–117)
ALT: 43 U/L — AB (ref 0–35)
AST: 36 U/L (ref 0–37)
Bilirubin, Direct: <0.1 mg/dL (ref 0.0–0.5)
TOTAL PROTEIN: 7.2 g/dL (ref 6.0–8.3)
Total Bilirubin: 0.5 mg/dL (ref 0.3–1.2)

## 2015-03-25 LAB — BASIC METABOLIC PANEL
ANION GAP: 7 (ref 5–15)
BUN: 6 mg/dL (ref 6–23)
CALCIUM: 9.4 mg/dL (ref 8.4–10.5)
CO2: 27 mmol/L (ref 19–32)
Chloride: 107 mmol/L (ref 96–112)
Creatinine, Ser: 0.69 mg/dL (ref 0.50–1.10)
GFR calc Af Amer: >90 mL/min (ref >90)
GFR calc non Af Amer: 87 mL/min — ABNORMAL LOW (ref >90)
Glucose, Bld: 92 mg/dL (ref 70–99)
Potassium: 4 mmol/L (ref 3.5–5.1)
Sodium: 141 mmol/L (ref 135–145)

## 2015-05-10 DIAGNOSIS — I252 Old myocardial infarction: Secondary | ICD-10-CM | POA: Diagnosis not present

## 2015-05-10 DIAGNOSIS — E785 Hyperlipidemia, unspecified: Secondary | ICD-10-CM | POA: Diagnosis not present

## 2015-05-10 DIAGNOSIS — Z720 Tobacco use: Secondary | ICD-10-CM | POA: Diagnosis not present

## 2015-05-10 DIAGNOSIS — I2510 Atherosclerotic heart disease of native coronary artery without angina pectoris: Secondary | ICD-10-CM | POA: Diagnosis not present

## 2015-05-10 DIAGNOSIS — F17290 Nicotine dependence, other tobacco product, uncomplicated: Secondary | ICD-10-CM | POA: Diagnosis not present

## 2015-05-10 DIAGNOSIS — Z716 Tobacco abuse counseling: Secondary | ICD-10-CM | POA: Diagnosis not present

## 2015-05-10 DIAGNOSIS — I10 Essential (primary) hypertension: Secondary | ICD-10-CM | POA: Diagnosis not present

## 2015-10-11 DIAGNOSIS — F17290 Nicotine dependence, other tobacco product, uncomplicated: Secondary | ICD-10-CM | POA: Diagnosis not present

## 2015-10-11 DIAGNOSIS — I252 Old myocardial infarction: Secondary | ICD-10-CM | POA: Diagnosis not present

## 2015-10-11 DIAGNOSIS — E785 Hyperlipidemia, unspecified: Secondary | ICD-10-CM | POA: Diagnosis not present

## 2015-10-11 DIAGNOSIS — I10 Essential (primary) hypertension: Secondary | ICD-10-CM | POA: Diagnosis not present

## 2015-10-11 DIAGNOSIS — I2510 Atherosclerotic heart disease of native coronary artery without angina pectoris: Secondary | ICD-10-CM | POA: Diagnosis not present

## 2015-12-17 ENCOUNTER — Ambulatory Visit
Admission: EM | Admit: 2015-12-17 | Discharge: 2015-12-17 | Disposition: A | Discharge status: Home / Self Care | Payer: Medicare Other | Attending: Family Medicine | Admitting: Family Medicine

## 2015-12-17 ENCOUNTER — Ambulatory Visit: Payer: Medicare Other

## 2015-12-17 DIAGNOSIS — E7800 Pure hypercholesterolemia, unspecified: Secondary | ICD-10-CM | POA: Diagnosis not present

## 2015-12-17 DIAGNOSIS — F17210 Nicotine dependence, cigarettes, uncomplicated: Secondary | ICD-10-CM | POA: Diagnosis not present

## 2015-12-17 DIAGNOSIS — Z7982 Long term (current) use of aspirin: Secondary | ICD-10-CM | POA: Diagnosis not present

## 2015-12-17 DIAGNOSIS — J441 Chronic obstructive pulmonary disease with (acute) exacerbation: Secondary | ICD-10-CM | POA: Diagnosis not present

## 2015-12-17 DIAGNOSIS — J449 Chronic obstructive pulmonary disease, unspecified: Secondary | ICD-10-CM | POA: Diagnosis not present

## 2015-12-17 DIAGNOSIS — I252 Old myocardial infarction: Secondary | ICD-10-CM | POA: Diagnosis not present

## 2015-12-17 DIAGNOSIS — R509 Fever, unspecified: Secondary | ICD-10-CM | POA: Diagnosis not present

## 2015-12-17 DIAGNOSIS — R05 Cough: Secondary | ICD-10-CM | POA: Diagnosis not present

## 2015-12-17 HISTORY — DX: Old myocardial infarction: I25.2

## 2015-12-17 HISTORY — DX: Pure hypercholesterolemia, unspecified: E78.00

## 2015-12-17 MED ORDER — HYDROCOD POLST-CPM POLST ER 10-8 MG/5ML PO SUER: 5.0000 mL | Freq: Two times a day (BID) | ORAL | Status: DC

## 2015-12-17 MED ORDER — SULFAMETHOXAZOLE-TRIMETHOPRIM 800-160 MG PO TABS: 1.0000 | ORAL_TABLET | Freq: Two times a day (BID) | ORAL | Status: DC

## 2015-12-17 MED ORDER — METHYLPREDNISOLONE 4 MG PO TBPK: ORAL_TABLET | ORAL | Status: DC

## 2015-12-17 NOTE — ED Provider Notes (Signed)
CSN: [id number]     Arrival date & time 12/17/15  1130 History   First MD Initiated Contact with Patient 12/17/15 1433     Chief Complaint  Patient presents with  . Cough   (Consider location/radiation/quality/duration/timing/severity/associated sxs/prior Treatment) HPI  69 year old female who presents with a one-month history productive cough of yellow sputum and sometimes clear sputum and rib pain whenever she moves certain ways or when she coughs. States for the last 2 weeks just got worse. 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 Has had chills but is afebrile today at 98.3. Pulse is 85 respirations 16 blood pressure 99/65 and O2 sat on room air were 98%. Long history of smoking and is currently still smoking a half a pack of cigarettes daily Past Medical History  Diagnosis Date  . Diabetes insipidus ([place])   . MI, old   . Hypercholesteremia    Past Surgical History  Procedure Laterality Date  . Left heart catheterization with coronary angiogram N/A 09/29/2014    Procedure: LEFT HEART CATHETERIZATION WITH CORONARY ANGIOGRAM;  Surgeon: Clent Demark, MD;  Location: [facility] CATH LAB;  Service: Cardiovascular;  Laterality: N/A;  . Cardiac stents    . Hernia repair    . Appendectomy    . Bladder neck suspension    . Abdominal surgery     Family History  Problem Relation Age of Onset  . Cancer Mother   . Cancer Father    Social History  Substance Use Topics  . Smoking status: Current Every Day Smoker -- 1.00 packs/day    Types: Cigarettes  . Smokeless tobacco: Never Used  . Alcohol Use: Yes     Comment: occassional   OB History    No data available     Review of Systems  Constitutional: Positive for fever, chills, activity change and fatigue. Negative for diaphoresis.  HENT: Positive for congestion.   Respiratory: Positive for cough and shortness of breath. Negative for wheezing and  stridor.   All other systems reviewed and are negative.   Allergies  Review of patient's allergies indicates no known allergies.  Home Medications   Prior to Admission medications   Medication Sig Start Date End Date Taking? Authorizing Provider  aspirin 81 MG chewable tablet Chew 1 tablet (81 mg total) by mouth daily. 10/02/14  Yes Charolette Forward, MD  atorvastatin (LIPITOR) 80 MG tablet Take 1 tablet (80 mg total) by mouth daily at 6 PM. 10/02/14  Yes Charolette Forward, MD  clopidogrel (PLAVIX) 75 MG tablet Take 75 mg by mouth daily.   Yes Historical Provider, MD  metoprolol tartrate (LOPRESSOR) 12.5 mg TABS tablet Take 0.5 tablets (12.5 mg total) by mouth 2 (two) times daily. 10/02/14  Yes Charolette Forward, MD  chlorpheniramine-HYDROcodone (TUSSIONEX PENNKINETIC ER) 10-8 MG/5ML SUER Take 5 mLs by mouth 2 (two) times daily. 12/17/15   Lorin Picket, PA-C  methylPREDNISolone (MEDROL DOSEPAK) 4 MG TBPK tablet Take per package instructions 12/17/15   Lorin Picket, PA-C  nitroGLYCERIN (NITROSTAT) 0.4 MG SL tablet Place 1 tablet (0.4 mg total) under the tongue every 5 (five) minutes x 3 doses as needed for chest pain. 10/02/14   Charolette Forward, MD  sulfamethoxazole-trimethoprim (BACTRIM DS,SEPTRA DS) 800-160 MG tablet Take 1 tablet by mouth 2 (two) times daily. 12/17/15   Lorin Picket, PA-C  ticagrelor (BRILINTA) 90 MG TABS tablet Take 1 tablet (90  mg total) by mouth 2 (two) times daily. 10/02/14   Charolette Forward, MD   Meds Ordered and Administered this Visit  Medications - No data to display  BP 99/65 mmHg  Pulse 85  Temp(Src) 98.3 F (36.8 C) (Tympanic)  Resp 16  Ht '5\' 7"'$  (1.702 m)  Wt 172 lb (78.019 kg)  BMI 26.93 kg/m2  SpO2 98% No data found.   Physical Exam  Constitutional: She is oriented to person, place, and time. She appears well-developed and well-nourished. No distress.  HENT:  Head: Normocephalic and atraumatic.  Right Ear: External ear normal.  Left Ear: External  ear normal.  Nose: Nose normal.  Mouth/Throat: Oropharynx is clear and moist. No oropharyngeal exudate.  Eyes: Conjunctivae are normal. Pupils are equal, round, and reactive to light.  Neck: Normal range of motion. Neck supple.  Cardiovascular: Normal rate, regular rhythm and normal heart sounds.  Exam reveals no gallop and no friction rub.   No murmur heard. Pulmonary/Chest: Effort normal and breath sounds normal. No respiratory distress. She has no wheezes. She has no rales.  Musculoskeletal: Normal range of motion. She exhibits no edema or tenderness.  Lymphadenopathy:    She has no cervical adenopathy.  Neurological: She is alert and oriented to person, place, and time.  Skin: Skin is warm and dry. She is not diaphoretic.  Psychiatric: Her behavior is normal. Judgment and thought content normal.  Nursing note and vitals reviewed.   ED Course  Procedures (including critical care time)  Labs Review Labs Reviewed - No data to display  Imaging Review Dg Chest 2 View  12/17/2015  CLINICAL DATA:  Productive cough with fever for 1 month. Bilateral anterior lower rib pain for 4-5 days. EXAM: CHEST  2 VIEW COMPARISON:  None. FINDINGS: Heart and mediastinal contours are within normal limits. No focal opacities or effusions. No acute bony abnormality. IMPRESSION: No active cardiopulmonary disease. Electronically Signed   By: Rolm Baptise M.D.   On: 12/17/2015 13:31     Visual Acuity Review  Right Eye Distance:   Left Eye Distance:   Bilateral Distance:    Right Eye Near:   Left Eye Near:    Bilateral Near:     ED ECG REPORT   Date: 12/17/2015  EKG Time: 3:47 PM  Rate: 73  Rhythm: normal sinus rhythm, , normal sinus rhythm, unchanged from previous tracings of 09/30/2014.  Axis:normal  Intervals:normal  ST&T Change:T wave changes V1 & V2 unchanged from previous tracings  Narrative Interpretation:NSR with no acute changes.    EKG was personally reviewed and previous tracings  reviewed for comparison.           MDM   1. COPD with exacerbation [facility])    Discharge Medication List as of 12/17/2015  3:15 PM    START taking these medications   Details  chlorpheniramine-HYDROcodone (TUSSIONEX PENNKINETIC ER) 10-8 MG/5ML SUER Take 5 mLs by mouth 2 (two) times daily., Starting 12/17/2015, Until Discontinued, Print    methylPREDNISolone (MEDROL DOSEPAK) 4 MG TBPK tablet Take per package instructions, Normal    sulfamethoxazole-trimethoprim (BACTRIM DS,SEPTRA DS) 800-160 MG tablet Take 1 tablet by mouth 2 (two) times daily., Starting 12/17/2015, Until Discontinued, Normal      Plan: 1. Test/x-ray results and diagnosis reviewed with patient 2. rx as per orders; risks, benefits, potential side effects reviewed with patient 3. Recommend supportive treatment with rest and fluids. Patient absolutely needs to stop smoking. Will need follow-up and I provided her the names of  [facility] to make an appointment as soon as possible. Further ,EKG shows no changes from previous tracings. If she has increasing shortness of breath or any change or is not improving I recommended she go to the emergency room. 4. F/u with primary care physician. She will require ongoing care and monitoring.   Lorin Picket, PA-C 12/17/15 [phone]

## 2015-12-17 NOTE — ED Notes (Signed)
States has had cough and rib pain x 1 month. + productive. Hx of MI with stents in 10/16

## 2015-12-17 NOTE — Discharge Instructions (Signed)
Chronic Bronchitis Chronic bronchitis is a lasting inflammation of the bronchial tubes, which are the tubes that carry air into your lungs. This is inflammation that occurs:   On most days of the week.   For at least three months at a time.   Over a period of two years in a row. When the bronchial tubes are inflamed, they start to produce mucus. The inflammation and buildup of mucus make it more difficult to breathe. Chronic bronchitis is usually a permanent problem and is one type of chronic obstructive pulmonary disease (COPD). People with chronic bronchitis are at greater risk for getting repeated colds, or respiratory infections. CAUSES  Chronic bronchitis most often occurs in people who have:  Long-standing, severe asthma.  A history of smoking.  Asthma and who also smoke. SIGNS AND SYMPTOMS  Chronic bronchitis may cause the following:   A cough that brings up mucus (productive cough).  Shortness of breath.  Early morning headache.  Wheezing.  Chest discomfort.   Recurring respiratory infections. DIAGNOSIS  Your health care provider may confirm the diagnosis by:  Taking your medical history.  Performing a physical exam.  Taking a chest X-ray.   Performing pulmonary function tests. TREATMENT  Treatment involves controlling symptoms with medicines, oxygen therapy, or making lifestyle changes, such as exercising and eating a healthy, well-balanced diet. Medicines could include:  Inhalers to improve air flow in and out of your lungs.  Antibiotics to treat bacterial infections, such as pneumonia, sinus infections, and acute bronchitis. As a preventative measure, your health care provider may recommend routine vaccinations for influenza and pneumonia. This is to prevent infection and hospitalization since you may be more at risk for these types of infections.  HOME CARE INSTRUCTIONS  Take medicines only as directed by your health care provider.   If you smoke  cigarettes, chew tobacco, or use electronic cigarettes, quit. If you need help quitting, ask your health care provider.  Avoid pollen, dust, animal dander, molds, smoke, and other things that cause shortness of breath or wheezing attacks.  Talk to your health care provider about possible exercise routines. Regular exercise is very important to help you feel better.  If you are prescribed oxygen use at home follow these guidelines:  Never smoke while using oxygen. Oxygen does not burn or explode, but flammable materials will burn faster in the presence of oxygen.  Keep a fire extinguisher close by. Let your fire department know that you have oxygen in your home.  Warn visitors not to smoke near you when you are using oxygen. Put up "no smoking" signs in your home where you most often use the oxygen.  Regularly test your smoke detectors at home to make sure they work. If you receive care in your home from a nurse or other health care provider, he or she may also check to make sure your smoke detectors work.  Ask your health care provider whether you would benefit from a pulmonary rehabilitation program.  Do not wait to get medical care if you have any concerning symptoms. Delays could cause permanent injury and may be life threatening. SEEK MEDICAL CARE IF:  You have increased coughing or shortness of breath or both.  You have muscle aches.  You have chest pain.  Your mucus gets thicker.  Your mucus changes from clear or white to yellow, green, gray, or bloody. SEEK IMMEDIATE MEDICAL CARE IF:  Your usual medicines do not stop your wheezing.   You have increased difficulty breathing.     You have any problems with the medicine you are taking, such as a rash, itching, swelling, or trouble breathing. MAKE SURE YOU:   Understand these instructions.  Will watch your condition.  Will get help right away if you are not doing well or get worse.   This information is not intended to  replace advice given to you by your health care provider. Make sure you discuss any questions you have with your health care provider.   Document Released: 09/24/2006 Document Revised: 12/28/2014 Document Reviewed: 01/15/2014 [employer] Interactive Patient Education 2016 [employer]  

## 2016-01-02 ENCOUNTER — Ambulatory Visit (INDEPENDENT_AMBULATORY_CARE_PROVIDER_SITE_OTHER): Payer: Medicare Other | Admitting: Family Medicine

## 2016-01-02 ENCOUNTER — Encounter: Payer: Self-pay | Admitting: Family Medicine

## 2016-01-02 VITALS — BP 118/78 | HR 97 | Temp 97.9°F | Resp 16 | Ht 67.5 in | Wt 174.0 lb

## 2016-01-02 DIAGNOSIS — I10 Essential (primary) hypertension: Secondary | ICD-10-CM | POA: Diagnosis not present

## 2016-01-02 DIAGNOSIS — I25119 Atherosclerotic heart disease of native coronary artery with unspecified angina pectoris: Secondary | ICD-10-CM

## 2016-01-02 DIAGNOSIS — E559 Vitamin D deficiency, unspecified: Secondary | ICD-10-CM

## 2016-01-02 DIAGNOSIS — F17200 Nicotine dependence, unspecified, uncomplicated: Secondary | ICD-10-CM

## 2016-01-02 DIAGNOSIS — G8929 Other chronic pain: Secondary | ICD-10-CM

## 2016-01-02 DIAGNOSIS — Z800 Family history of malignant neoplasm of digestive organs: Secondary | ICD-10-CM | POA: Diagnosis not present

## 2016-01-02 DIAGNOSIS — M1712 Unilateral primary osteoarthritis, left knee: Secondary | ICD-10-CM | POA: Diagnosis not present

## 2016-01-02 DIAGNOSIS — M5030 Other cervical disc degeneration, unspecified cervical region: Secondary | ICD-10-CM | POA: Insufficient documentation

## 2016-01-02 DIAGNOSIS — Z720 Tobacco use: Secondary | ICD-10-CM | POA: Diagnosis not present

## 2016-01-02 DIAGNOSIS — I25118 Atherosclerotic heart disease of native coronary artery with other forms of angina pectoris: Secondary | ICD-10-CM | POA: Insufficient documentation

## 2016-01-02 DIAGNOSIS — E669 Obesity, unspecified: Secondary | ICD-10-CM | POA: Insufficient documentation

## 2016-01-02 DIAGNOSIS — E782 Mixed hyperlipidemia: Secondary | ICD-10-CM | POA: Insufficient documentation

## 2016-01-02 DIAGNOSIS — E785 Hyperlipidemia, unspecified: Secondary | ICD-10-CM

## 2016-01-02 DIAGNOSIS — Z23 Encounter for immunization: Secondary | ICD-10-CM

## 2016-01-02 DIAGNOSIS — E663 Overweight: Secondary | ICD-10-CM

## 2016-01-02 DIAGNOSIS — K589 Irritable bowel syndrome without diarrhea: Secondary | ICD-10-CM

## 2016-01-02 DIAGNOSIS — R7303 Prediabetes: Secondary | ICD-10-CM | POA: Diagnosis not present

## 2016-01-02 MED ORDER — ATORVASTATIN CALCIUM 80 MG PO TABS: 40.0000 mg | ORAL_TABLET | Freq: Every day | ORAL | Status: DC

## 2016-01-02 MED ORDER — HYOSCYAMINE SULFATE 0.125 MG SL SUBL: 0.1250 mg | SUBLINGUAL_TABLET | SUBLINGUAL | Status: DC | PRN

## 2016-01-02 MED ORDER — TRAMADOL HCL 50 MG PO TABS: 50.0000 mg | ORAL_TABLET | Freq: Three times a day (TID) | ORAL | Status: DC | PRN

## 2016-01-02 NOTE — Progress Notes (Signed)
Date:  01/02/2016   Name:  Kristi [name] [facility]   DOB:  05/19/1946   MRN:  [id number]  PCP:  No PCP Per Patient    Chief Complaint: Establish Care   History of Present Illness:  This is a 70 y.o. female to establish primary care, moved from [facility] 2 years ago. 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 Concerned about recent weight gain, had stopped walking due to weather but plans to restart. Chronic pain since dog bite several years ago, takes tramadol prn, about once monthly. Hx IBS, takes Levsin prn, about once monthly. Hx HLD on Lipitor, last LDL 90 in April. Seen in [place] 2 wks ago for bronchitis now resolved on Bactrim. Smokes 1/2 ppd down from 2 ppd. Hx prediabetes. Known cervical DDD and OA L knee. Father died colon ca, had CAD, mother died colon ca, had CAD and Alz dementia. Tetanus imm 2010, pneumovax 2015, declines shingles imm. Colonoscopy 2012 no polyps due again 2017 per GI.   Review of Systems:  Review of Systems  Constitutional: Negative for fever.  Respiratory: Negative for shortness of breath.   Cardiovascular: Negative for chest pain and leg swelling.  Gastrointestinal: Negative for abdominal pain.  Endocrine: Negative for polyuria.  Genitourinary: Negative for difficulty urinating.  Neurological: Negative for syncope and light-headedness.    Patient Active Problem List   Diagnosis Date Noted  . Hyperlipidemia 01/02/2016  . Hypertension 01/02/2016  . Chronic pain 01/02/2016  . Overweight (BMI 25.0-29.9) 01/02/2016  . CAD (coronary artery disease) 01/02/2016  . IBS (irritable bowel syndrome) 01/02/2016  . FH: colon cancer 01/02/2016  . DDD (degenerative disc disease), cervical 01/02/2016  . Primary osteoarthritis of left knee 01/02/2016  . Acute MI, inferoposterior wall ([place]) 09/30/2014    Prior to Admission medications   Medication Sig Start Date End Date Taking? Authorizing Provider  aspirin 81 MG chewable tablet Chew 1 tablet (81  mg total) by mouth daily. 10/02/14  Yes Charolette Forward, MD  atorvastatin (LIPITOR) 80 MG tablet Take 0.5 tablets (40 mg total) by mouth daily at 6 PM. 01/02/16  Yes Adline Potter, MD  clopidogrel (PLAVIX) 75 MG tablet Take 75 mg by mouth daily.   Yes Historical Provider, MD  metoprolol tartrate (LOPRESSOR) 25 MG tablet Take 12.5 mg by mouth 2 (two) times daily.   Yes Historical Provider, MD  nitroGLYCERIN (NITROSTAT) 0.4 MG SL tablet Place 1 tablet (0.4 mg total) under the tongue every 5 (five) minutes x 3 doses as needed for chest pain. 10/02/14  Yes Charolette Forward, MD  hyoscyamine (LEVSIN SL) 0.125 MG SL tablet Place 1 tablet (0.125 mg total) under the tongue every 4 (four) hours as needed. 01/02/16   Adline Potter, MD  traMADol (ULTRAM) 50 MG tablet Take 1 tablet (50 mg total) by mouth every 8 (eight) hours as needed. 01/02/16   Adline Potter, MD    No Known Allergies  Past Surgical History  Procedure Laterality Date  . Left heart catheterization with coronary angiogram N/A 09/29/2014    Procedure: LEFT HEART CATHETERIZATION WITH CORONARY ANGIOGRAM;  Surgeon: Clent Demark, MD;  Location: [facility] CATH LAB;  Service: Cardiovascular;  Laterality: N/A;  . Cardiac stents    . Hernia repair    . Appendectomy    . Bladder neck suspension    . Abdominal surgery      Social History  Substance Use Topics  . Smoking status: Current Every Day Smoker -- 0.50 packs/day  Types: Cigarettes  . Smokeless tobacco: Never Used  . Alcohol Use: 0.0 oz/week    0 Standard drinks or equivalent per week     Comment: occassional    Family History  Problem Relation Age of Onset  . Cancer Mother   . Cancer Father     Medication list has been reviewed and updated.  Physical Examination: BP 118/78 mmHg  Pulse 97  Temp(Src) 97.9 F (36.6 C) (Oral)  Resp 16  Ht 5' 7.5" (1.715 m)  Wt 174 lb (78.926 kg)  BMI 26.83 kg/m2  SpO2 96%  Physical Exam  Constitutional: She is oriented to person, place, and  time. She appears well-developed and well-nourished.  HENT:  Right Ear: External ear normal.  Left Ear: External ear normal.  Nose: Nose normal.  Mouth/Throat: Oropharynx is clear and moist.  TM's clear  Eyes: Conjunctivae and EOM are normal. Pupils are equal, round, and reactive to light.  Neck: Neck supple. No thyromegaly present.  Cardiovascular: Normal rate, regular rhythm and normal heart sounds.   Pulmonary/Chest: Effort normal and breath sounds normal.  Abdominal: Soft. She exhibits no distension and no mass. There is no tenderness.  Musculoskeletal: She exhibits no edema.  Lymphadenopathy:    She has no cervical adenopathy.  Neurological: She is alert and oriented to person, place, and time.  Skin: Skin is warm and dry.  Psychiatric: She has a normal mood and affect. Her behavior is normal.  Nursing note and vitals reviewed.   Assessment and Plan:  1. Coronary artery disease involving native coronary artery of native heart with angina pectoris ([place]) Followed by cards, cont asa, BB, statin, discuss continued need for Plavix next cardiology visit  2. Essential hypertension Well controlled, cont metoprolol - Comprehensive Metabolic Panel (CMET) - CBC  3. Hyperlipidemia Well controlled, cont Lipitor  4. Chronic pain Refill tramadol prn x 30 only  5. Prediabetes - HgB A1c  6. Overweight (BMI 25.0-29.9) Discussed exercise 150 mins/wk - TSH - Vitamin D (25 hydroxy)  7. IBS (irritable bowel syndrome) Refill Levsin prn x 30 only  8. FH: colon cancer For repeat colonoscopy this year  9. DDD (degenerative disc disease), cervical  10. Primary osteoarthritis of left knee  11. Need for pneumococcal vaccination - Pneumococcal conjugate vaccine 13-valent  12. Need for influenza vaccination - Flu Vaccine QUAD 36+ mos PF IM (Fluarix & Fluzone Quad PF)  13. Smoker Strongly advised cessation  Return in about 4 weeks (around 01/30/2016).  Satira Anis. [facility]  01/02/2016

## 2016-01-06 DIAGNOSIS — E663 Overweight: Secondary | ICD-10-CM | POA: Diagnosis not present

## 2016-01-06 DIAGNOSIS — E559 Vitamin D deficiency, unspecified: Secondary | ICD-10-CM | POA: Diagnosis not present

## 2016-01-06 DIAGNOSIS — I10 Essential (primary) hypertension: Secondary | ICD-10-CM | POA: Diagnosis not present

## 2016-01-06 DIAGNOSIS — R7303 Prediabetes: Secondary | ICD-10-CM | POA: Diagnosis not present

## 2016-01-07 DIAGNOSIS — E559 Vitamin D deficiency, unspecified: Secondary | ICD-10-CM | POA: Insufficient documentation

## 2016-01-07 LAB — COMPREHENSIVE METABOLIC PANEL
ALT: 29 IU/L (ref 0–32)
AST: 26 IU/L (ref 0–40)
Albumin/Globulin Ratio: 1.5 (ref 1.1–2.5)
Albumin: 4.4 g/dL (ref 3.6–4.8)
Alkaline Phosphatase: 114 IU/L (ref 39–117)
BUN/Creatinine Ratio: 14 (ref 11–26)
BUN: 11 mg/dL (ref 8–27)
Bilirubin Total: 0.3 mg/dL (ref 0.0–1.2)
CALCIUM: 9.8 mg/dL (ref 8.7–10.3)
CO2: 24 mmol/L (ref 18–29)
CREATININE: 0.76 mg/dL (ref 0.57–1.00)
Chloride: 97 mmol/L (ref 96–106)
GFR, EST AFRICAN AMERICAN: 93 mL/min/{1.73_m2} (ref >59)
GFR, EST NON AFRICAN AMERICAN: 80 mL/min/{1.73_m2} (ref >59)
Globulin, Total: 2.9 g/dL (ref 1.5–4.5)
Glucose: 98 mg/dL (ref 65–99)
Potassium: 3.9 mmol/L (ref 3.5–5.2)
SODIUM: 139 mmol/L (ref 134–144)
TOTAL PROTEIN: 7.3 g/dL (ref 6.0–8.5)

## 2016-01-07 LAB — CBC
HEMATOCRIT: 42.5 % (ref 34.0–46.6)
HEMOGLOBIN: 14.6 g/dL (ref 11.1–15.9)
MCH: 30.7 pg (ref 26.6–33.0)
MCHC: 34.4 g/dL (ref 31.5–35.7)
MCV: 89 fL (ref 79–97)
Platelets: 239 10*3/uL (ref 150–379)
RBC: 4.76 x10E6/uL (ref 3.77–5.28)
RDW: 13.5 % (ref 12.3–15.4)
WBC: 9 10*3/uL (ref 3.4–10.8)

## 2016-01-07 LAB — VITAMIN D 25 HYDROXY (VIT D DEFICIENCY, FRACTURES): VIT D 25 HYDROXY: 18.8 ng/mL — AB (ref 30.0–100.0)

## 2016-01-07 LAB — TSH: TSH: 1.46 u[IU]/mL (ref 0.450–4.500)

## 2016-01-07 LAB — HEMOGLOBIN A1C
Est. average glucose Bld gHb Est-mCnc: 111 mg/dL
Hgb A1c MFr Bld: 5.5 % (ref 4.8–5.6)

## 2016-01-07 MED ORDER — VITAMIN D 50 MCG (2000 UT) PO CAPS: 1.0000 | ORAL_CAPSULE | Freq: Every day | ORAL | Status: DC

## 2016-01-07 NOTE — Addendum Note (Signed)
Addended by: Adline Potter on: 01/07/2016 09:06 AM   Modules accepted: Orders

## 2016-01-10 DIAGNOSIS — I252 Old myocardial infarction: Secondary | ICD-10-CM | POA: Diagnosis not present

## 2016-01-10 DIAGNOSIS — I10 Essential (primary) hypertension: Secondary | ICD-10-CM | POA: Diagnosis not present

## 2016-01-10 DIAGNOSIS — E785 Hyperlipidemia, unspecified: Secondary | ICD-10-CM | POA: Diagnosis not present

## 2016-01-10 DIAGNOSIS — Z716 Tobacco abuse counseling: Secondary | ICD-10-CM | POA: Diagnosis not present

## 2016-01-10 DIAGNOSIS — E559 Vitamin D deficiency, unspecified: Secondary | ICD-10-CM | POA: Diagnosis not present

## 2016-01-10 DIAGNOSIS — Z720 Tobacco use: Secondary | ICD-10-CM | POA: Diagnosis not present

## 2016-01-10 DIAGNOSIS — F17290 Nicotine dependence, other tobacco product, uncomplicated: Secondary | ICD-10-CM | POA: Diagnosis not present

## 2016-01-10 DIAGNOSIS — R0609 Other forms of dyspnea: Secondary | ICD-10-CM | POA: Diagnosis not present

## 2016-01-10 DIAGNOSIS — I2510 Atherosclerotic heart disease of native coronary artery without angina pectoris: Secondary | ICD-10-CM | POA: Diagnosis not present

## 2016-03-09 ENCOUNTER — Other Ambulatory Visit: Payer: Self-pay | Admitting: Family Medicine

## 2016-03-09 MED ORDER — HYOSCYAMINE SULFATE 0.125 MG SL SUBL: 0.1250 mg | SUBLINGUAL_TABLET | SUBLINGUAL | Status: DC | PRN

## 2016-04-17 DIAGNOSIS — I252 Old myocardial infarction: Secondary | ICD-10-CM | POA: Diagnosis not present

## 2016-04-17 DIAGNOSIS — F17290 Nicotine dependence, other tobacco product, uncomplicated: Secondary | ICD-10-CM | POA: Diagnosis not present

## 2016-04-17 DIAGNOSIS — I10 Essential (primary) hypertension: Secondary | ICD-10-CM | POA: Diagnosis not present

## 2016-04-17 DIAGNOSIS — I2510 Atherosclerotic heart disease of native coronary artery without angina pectoris: Secondary | ICD-10-CM | POA: Diagnosis not present

## 2016-04-17 DIAGNOSIS — E785 Hyperlipidemia, unspecified: Secondary | ICD-10-CM | POA: Diagnosis not present

## 2016-04-17 DIAGNOSIS — E559 Vitamin D deficiency, unspecified: Secondary | ICD-10-CM | POA: Diagnosis not present

## 2016-05-11 ENCOUNTER — Telehealth: Payer: Self-pay

## 2016-05-11 NOTE — Telephone Encounter (Signed)
Patient said pharmacy informed her that we denied Tramadol. I told her it is not due to it being to early as she said but most likely due to not being seen for follow up. Patient said there is no need to follow up due to her feeling fine. I explained we need visit now and then for documentation when on controlled Rx but patient said if you can not refill now then she will not be back .

## 2016-05-11 NOTE — Telephone Encounter (Signed)
Ok thanks 

## 2016-05-14 ENCOUNTER — Other Ambulatory Visit: Payer: Self-pay

## 2016-07-17 DIAGNOSIS — E559 Vitamin D deficiency, unspecified: Secondary | ICD-10-CM | POA: Diagnosis not present

## 2016-07-17 DIAGNOSIS — I252 Old myocardial infarction: Secondary | ICD-10-CM | POA: Diagnosis not present

## 2016-07-17 DIAGNOSIS — F17290 Nicotine dependence, other tobacco product, uncomplicated: Secondary | ICD-10-CM | POA: Diagnosis not present

## 2016-07-17 DIAGNOSIS — Z716 Tobacco abuse counseling: Secondary | ICD-10-CM | POA: Diagnosis not present

## 2016-07-17 DIAGNOSIS — E785 Hyperlipidemia, unspecified: Secondary | ICD-10-CM | POA: Diagnosis not present

## 2016-07-17 DIAGNOSIS — I2510 Atherosclerotic heart disease of native coronary artery without angina pectoris: Secondary | ICD-10-CM | POA: Diagnosis not present

## 2016-07-17 DIAGNOSIS — I10 Essential (primary) hypertension: Secondary | ICD-10-CM | POA: Diagnosis not present

## 2016-07-17 DIAGNOSIS — Z720 Tobacco use: Secondary | ICD-10-CM | POA: Diagnosis not present

## 2016-07-27 ENCOUNTER — Emergency Department
Admission: EM | Admit: 2016-07-27 | Discharge: 2016-07-27 | Disposition: A | Discharge status: Home / Self Care | Payer: Medicare Other | Attending: Emergency Medicine | Admitting: Emergency Medicine

## 2016-07-27 ENCOUNTER — Emergency Department: Payer: Medicare Other

## 2016-07-27 ENCOUNTER — Encounter: Payer: Self-pay | Admitting: Emergency Medicine

## 2016-07-27 DIAGNOSIS — S0993XA Unspecified injury of face, initial encounter: Secondary | ICD-10-CM | POA: Diagnosis not present

## 2016-07-27 DIAGNOSIS — Z7982 Long term (current) use of aspirin: Secondary | ICD-10-CM | POA: Insufficient documentation

## 2016-07-27 DIAGNOSIS — Y939 Activity, unspecified: Secondary | ICD-10-CM | POA: Insufficient documentation

## 2016-07-27 DIAGNOSIS — S8000XA Contusion of unspecified knee, initial encounter: Secondary | ICD-10-CM

## 2016-07-27 DIAGNOSIS — W0110XA Fall on same level from slipping, tripping and stumbling with subsequent striking against unspecified object, initial encounter: Secondary | ICD-10-CM | POA: Diagnosis not present

## 2016-07-27 DIAGNOSIS — S0093XA Contusion of unspecified part of head, initial encounter: Secondary | ICD-10-CM | POA: Diagnosis not present

## 2016-07-27 DIAGNOSIS — I10 Essential (primary) hypertension: Secondary | ICD-10-CM | POA: Insufficient documentation

## 2016-07-27 DIAGNOSIS — M79602 Pain in left arm: Secondary | ICD-10-CM | POA: Diagnosis not present

## 2016-07-27 DIAGNOSIS — Y929 Unspecified place or not applicable: Secondary | ICD-10-CM | POA: Insufficient documentation

## 2016-07-27 DIAGNOSIS — F17210 Nicotine dependence, cigarettes, uncomplicated: Secondary | ICD-10-CM | POA: Diagnosis not present

## 2016-07-27 DIAGNOSIS — S0083XA Contusion of other part of head, initial encounter: Secondary | ICD-10-CM

## 2016-07-27 DIAGNOSIS — S0012XA Contusion of left eyelid and periocular area, initial encounter: Secondary | ICD-10-CM | POA: Diagnosis not present

## 2016-07-27 DIAGNOSIS — S2231XA Fracture of one rib, right side, initial encounter for closed fracture: Secondary | ICD-10-CM | POA: Diagnosis not present

## 2016-07-27 DIAGNOSIS — S0990XA Unspecified injury of head, initial encounter: Secondary | ICD-10-CM | POA: Diagnosis not present

## 2016-07-27 DIAGNOSIS — I252 Old myocardial infarction: Secondary | ICD-10-CM | POA: Diagnosis not present

## 2016-07-27 DIAGNOSIS — S8002XA Contusion of left knee, initial encounter: Secondary | ICD-10-CM | POA: Diagnosis not present

## 2016-07-27 DIAGNOSIS — E232 Diabetes insipidus: Secondary | ICD-10-CM | POA: Insufficient documentation

## 2016-07-27 DIAGNOSIS — R51 Headache: Secondary | ICD-10-CM | POA: Diagnosis not present

## 2016-07-27 DIAGNOSIS — S8001XA Contusion of right knee, initial encounter: Secondary | ICD-10-CM | POA: Insufficient documentation

## 2016-07-27 DIAGNOSIS — M25561 Pain in right knee: Secondary | ICD-10-CM | POA: Diagnosis not present

## 2016-07-27 DIAGNOSIS — S2232XA Fracture of one rib, left side, initial encounter for closed fracture: Secondary | ICD-10-CM | POA: Diagnosis not present

## 2016-07-27 DIAGNOSIS — Z79899 Other long term (current) drug therapy: Secondary | ICD-10-CM | POA: Diagnosis not present

## 2016-07-27 DIAGNOSIS — M7989 Other specified soft tissue disorders: Secondary | ICD-10-CM | POA: Diagnosis not present

## 2016-07-27 DIAGNOSIS — Y999 Unspecified external cause status: Secondary | ICD-10-CM | POA: Diagnosis not present

## 2016-07-27 DIAGNOSIS — W19XXXA Unspecified fall, initial encounter: Secondary | ICD-10-CM

## 2016-07-27 DIAGNOSIS — S8991XA Unspecified injury of right lower leg, initial encounter: Secondary | ICD-10-CM | POA: Diagnosis not present

## 2016-07-27 MED ORDER — OXYCODONE-ACETAMINOPHEN 5-325 MG PO TABS: 1.0000 | ORAL_TABLET | ORAL | 0 refills | Status: DC | PRN

## 2016-07-27 MED ORDER — MELOXICAM 15 MG PO TABS: 15.0000 mg | ORAL_TABLET | Freq: Every day | ORAL | 0 refills | Status: DC

## 2016-07-27 MED ORDER — CYCLOBENZAPRINE HCL 10 MG PO TABS: 10.0000 mg | ORAL_TABLET | Freq: Three times a day (TID) | ORAL | 0 refills | Status: DC | PRN

## 2016-07-27 MED ORDER — CYCLOBENZAPRINE HCL 5 MG PO TABS: 5.0000 mg | ORAL_TABLET | Freq: Three times a day (TID) | ORAL | 0 refills | Status: DC | PRN

## 2016-07-27 NOTE — ED Triage Notes (Signed)
Pt to ed with c/o fall on Friday afternoon.  Pt reports she tripped over a bench and fell onto her face.  Pt with noted bruising to left side of face,  Left arm pain and right rib pain.

## 2016-07-27 NOTE — ED Provider Notes (Signed)
[facility] Emergency Department Provider Note  ____________________________________________  Time seen: Approximately 8:45 AM  I have reviewed the triage vital signs and the nursing notes.   HISTORY  Chief Complaint Fall    HPI Kristi [name] is a 70 y.o. female presents for evaluation of falling 3 days ago. Patient reports that she tripped over a bench and fell onto her face. Patient with complaints of left-sided facial pain and left arm pain and right rib pain. The patient also has some bilateral knee bruising.   Past Medical History:  Diagnosis Date  . Diabetes insipidus ([place])   . Hypercholesteremia   . MI, old     Patient Active Problem List   Diagnosis Date Noted  . Vitamin D deficiency 01/07/2016  . Hyperlipidemia 01/02/2016  . Hypertension 01/02/2016  . Chronic pain 01/02/2016  . Overweight (BMI 25.0-29.9) 01/02/2016  . CAD (coronary artery disease) 01/02/2016  . IBS (irritable bowel syndrome) 01/02/2016  . FH: colon cancer 01/02/2016  . DDD (degenerative disc disease), cervical 01/02/2016  . Primary osteoarthritis of left knee 01/02/2016  . Smoker 01/02/2016  . Acute MI, inferoposterior wall ([place]) 09/30/2014    Past Surgical History:  Procedure Laterality Date  . ABDOMINAL SURGERY    . APPENDECTOMY    . BLADDER NECK SUSPENSION    . cardiac stents    . HERNIA REPAIR    . LEFT HEART CATHETERIZATION WITH CORONARY ANGIOGRAM N/A 09/29/2014   Procedure: LEFT HEART CATHETERIZATION WITH CORONARY ANGIOGRAM;  Surgeon: Clent Demark, MD;  Location: [facility] CATH LAB;  Service: Cardiovascular;  Laterality: N/A;    Prior to Admission medications   Medication Sig Start Date End Date Taking? Authorizing Provider  aspirin 81 MG chewable tablet Chew 1 tablet (81 mg total) by mouth daily. 10/02/14   Charolette Forward, MD  atorvastatin (LIPITOR) 80 MG tablet Take 0.5 tablets (40 mg total) by mouth daily at 6 PM. 01/02/16   Adline Potter, MD   Cholecalciferol (VITAMIN D) 2000 units CAPS Take 1 capsule (2,000 Units total) by mouth daily. 01/07/16   Adline Potter, MD  clopidogrel (PLAVIX) 75 MG tablet Take 75 mg by mouth daily.    Historical Provider, MD  cyclobenzaprine (FLEXERIL) 5 MG tablet Take 1 tablet (5 mg total) by mouth 3 (three) times daily as needed for muscle spasms. 07/27/16   Pierce Crane Dahl Higinbotham, PA-C  hyoscyamine (LEVSIN SL) 0.125 MG SL tablet Place 1 tablet (0.125 mg total) under the tongue every 4 (four) hours as needed. 03/09/16   Adline Potter, MD  meloxicam (MOBIC) 15 MG tablet Take 1 tablet (15 mg total) by mouth daily. 07/27/16   Pierce Crane Aislynn Cifelli, PA-C  metoprolol tartrate (LOPRESSOR) 25 MG tablet Take 12.5 mg by mouth 2 (two) times daily.    Historical Provider, MD  nitroGLYCERIN (NITROSTAT) 0.4 MG SL tablet Place 1 tablet (0.4 mg total) under the tongue every 5 (five) minutes x 3 doses as needed for chest pain. 10/02/14   Charolette Forward, MD  oxyCODONE-acetaminophen (ROXICET) 5-325 MG tablet Take 1-2 tablets by mouth every 4 (four) hours as needed for severe pain. 07/27/16   Pierce Crane Rahima Fleishman, PA-C  traMADol (ULTRAM) 50 MG tablet Take 1 tablet (50 mg total) by mouth every 8 (eight) hours as needed. 01/02/16   Adline Potter, MD    Allergies Review of patient's allergies indicates no known allergies.  Family History  Problem Relation Age of Onset  . Cancer Mother   . Cancer Father  Social History Social History  Substance Use Topics  . Smoking status: Current Every Day Smoker    Packs/day: 0.50    Types: Cigarettes  . Smokeless tobacco: Never Used  . Alcohol use 0.0 oz/week     Comment: occassional    Review of Systems Constitutional: No fever/chills Eyes: No visual changes. HEENT: No sore throat. Positive for ecchymosis and bruising around the left eye and face. Cardiovascular: Denies chest pain. Respiratory: Denies shortness of breath. Gastrointestinal: No abdominal pain.  No nausea, no vomiting.  No diarrhea.   No constipation. Genitourinary: Negative for dysuria. Musculoskeletal: Positive for bilateral knee pain positive for left arm and elbow pain. Right rib cage and chest wall positive for tenderness.  Skin: Negative for rash. Ecchymosis noted on face, both knees Neurological: Negative for headaches, focal weakness or numbness.  10-point ROS otherwise negative.  ____________________________________________   PHYSICAL EXAM:  VITAL SIGNS: ED Triage Vitals  Enc Vitals Group     BP 07/27/16 0838 (!) 137/91     Pulse Rate 07/27/16 0838 98     Resp 07/27/16 0838 16     Temp 07/27/16 0838 98.1 F (36.7 C)     Temp Source 07/27/16 0838 Oral     SpO2 07/27/16 0838 93 %     Weight 07/27/16 0838 170 lb (77.1 kg)     Height 07/27/16 0838 '5\' 7"'$  (1.702 m)     Head Circumference --      Peak Flow --      Pain Score 07/27/16 0829 5     Pain Loc --      Pain Edu? --      Excl. in [place]? --     Constitutional: Alert and oriented. Well appearing and in no acute distress. Eyes: Conjunctivae are normal. PERRL. EOMI. Head: Positive ecchymosis and bruising periorbital left eye as well as her areas of the face.. Nose: No congestion/rhinnorhea. Mouth/Throat: Mucous membranes are moist.  Oropharynx non-erythematous. Neck: No stridor.   Cardiovascular: Normal rate, regular rhythm. Grossly normal heart sounds.  Good peripheral circulation. Respiratory: Normal respiratory effort.  No retractions. Lungs CTAB. Gastrointestinal: Soft and nontender. No distention. No abdominal bruits. No CVA tenderness. Musculoskeletal: No lower extremity tenderness nor edema.  No joint effusions. Bruising noted to both knees. Point tenderness to the right knee. Weight-bearing tenderness to the right rib cage and chest wall. Neurologic:  Normal speech and language. No gross focal neurologic deficits are appreciated. No gait instability. Skin:  Skin is warm, dry and intact. No rash noted.Ecchymosis noted to the face, both  knees. Psychiatric: Mood and affect are normal. Speech and behavior are normal.  ____________________________________________   LABS (all labs ordered are listed, but only abnormal results are displayed)  Labs Reviewed - No data to display ____________________________________________  EKG   ____________________________________________  RADIOLOGY IMPRESSION: 1. Suspect minimally displaced anterior right fourth rib fracture. No other displaced rib fracture identified. 2. Small right pleural effusion suspected. No pneumothorax. 3. Calcified aortic atherosclerosis.  IMPRESSION: 1. No acute fracture or dislocation identified about the left knee. 2. Soft tissue swelling. Possible small joint effusion. ____________________________________________   PROCEDURES  Procedure(s) performed: None  Critical Care performed: No  ____________________________________________   INITIAL IMPRESSION / ASSESSMENT AND PLAN / ED COURSE  Pertinent labs & imaging results that were available during my care of the patient were reviewed by me and considered in my medical decision making (see chart for details).  Status post fall with facial contusion and head contusion  bilateral knee contusion and right rib fracture. Reassurance provided to the patient Rx given for Flexeril 5 mg, Percocet 5/325 and meloxicam 15 mg daily. She is to follow-up with her PCP or return to the ER with any worsening symptomology.  Clinical Course    ____________________________________________   FINAL CLINICAL IMPRESSION(S) / ED DIAGNOSES  Final diagnoses:  Fall, initial encounter  Facial contusion, initial encounter  Head contusion, initial encounter  Knee contusion, unspecified laterality, initial encounter  Rib fracture, right, closed, initial encounter     This chart was dictated using voice recognition software/Dragon. Despite best efforts to proofread, errors can occur which can change the meaning.  Any change was purely unintentional.    Arlyss Repress, PA-C 07/27/16 [place], MD 07/27/16 [phone]

## 2016-08-13 ENCOUNTER — Ambulatory Visit: Payer: Medicare Other

## 2016-08-13 ENCOUNTER — Encounter: Payer: Self-pay | Admitting: *Deleted

## 2016-08-13 ENCOUNTER — Ambulatory Visit
Admission: EM | Admit: 2016-08-13 | Discharge: 2016-08-13 | Disposition: A | Discharge status: Home / Self Care | Payer: Medicare Other | Attending: Family Medicine | Admitting: Family Medicine

## 2016-08-13 DIAGNOSIS — E559 Vitamin D deficiency, unspecified: Secondary | ICD-10-CM | POA: Diagnosis not present

## 2016-08-13 DIAGNOSIS — I252 Old myocardial infarction: Secondary | ICD-10-CM | POA: Diagnosis not present

## 2016-08-13 DIAGNOSIS — S2231XS Fracture of one rib, right side, sequela: Secondary | ICD-10-CM

## 2016-08-13 DIAGNOSIS — E232 Diabetes insipidus: Secondary | ICD-10-CM | POA: Insufficient documentation

## 2016-08-13 DIAGNOSIS — M79622 Pain in left upper arm: Secondary | ICD-10-CM | POA: Diagnosis not present

## 2016-08-13 DIAGNOSIS — Z7982 Long term (current) use of aspirin: Secondary | ICD-10-CM | POA: Diagnosis not present

## 2016-08-13 DIAGNOSIS — S5292XA Unspecified fracture of left forearm, initial encounter for closed fracture: Secondary | ICD-10-CM

## 2016-08-13 DIAGNOSIS — E7800 Pure hypercholesterolemia, unspecified: Secondary | ICD-10-CM | POA: Diagnosis not present

## 2016-08-13 DIAGNOSIS — W19XXXA Unspecified fall, initial encounter: Secondary | ICD-10-CM | POA: Insufficient documentation

## 2016-08-13 DIAGNOSIS — K589 Irritable bowel syndrome without diarrhea: Secondary | ICD-10-CM | POA: Insufficient documentation

## 2016-08-13 DIAGNOSIS — E785 Hyperlipidemia, unspecified: Secondary | ICD-10-CM | POA: Diagnosis not present

## 2016-08-13 DIAGNOSIS — I2510 Atherosclerotic heart disease of native coronary artery without angina pectoris: Secondary | ICD-10-CM | POA: Diagnosis not present

## 2016-08-13 DIAGNOSIS — S52132A Displaced fracture of neck of left radius, initial encounter for closed fracture: Secondary | ICD-10-CM | POA: Insufficient documentation

## 2016-08-13 DIAGNOSIS — I10 Essential (primary) hypertension: Secondary | ICD-10-CM | POA: Insufficient documentation

## 2016-08-13 DIAGNOSIS — F17210 Nicotine dependence, cigarettes, uncomplicated: Secondary | ICD-10-CM | POA: Insufficient documentation

## 2016-08-13 DIAGNOSIS — M79602 Pain in left arm: Secondary | ICD-10-CM

## 2016-08-13 MED ORDER — OXYCODONE-ACETAMINOPHEN 5-325 MG PO TABS: 1.0000 | ORAL_TABLET | Freq: Three times a day (TID) | ORAL | 0 refills | Status: DC | PRN

## 2016-08-13 NOTE — ED Provider Notes (Signed)
[facility]    CSN: [id number] Arrival date & time: 08/13/16  9562  First Provider Contact:  First MD Initiated Contact with Patient 08/13/16 1014        History   Chief Complaint Chief Complaint  Patient presents with  . Arm Pain  . Rib Injury    HPI Kristi [name] is a 70 y.o. female.   Patient's here because of pain in her left arm. She fell on 07/24/2016. States that she tripped over a bench that her symptoms found to keep the puppy in a certain area at his house. She states did not see the bench and she literally went flying in the air and landed on her ribs. She was seen in the ED by Mr. Elliot Cousin and had a CT of the face CT of the head x-ray of the left humerus right ribs and of her knees. They found a fractured right rib and a small pleural effusion. Since then she still has some pain in her right side since had difficulty sleeping on her right side. She comes in today because the left arm pain is gotten worse and now she has also left forearm pain as well. She states she can take off her bra she can't difficulty dressing eating bulking her seatbelt etc. because she uses her left hand and arm. She is also out of her pain medication and she was given by Mr. Elliot Cousin. And though she had numerous x-rays in the ED on 07/27/2016 when she call Dr. Chinita Greenland office they referred her here because she was still having so much pain. Dr.Plonk is on sabbatical for geriatric training and have explained to her that even though he is on sabbatical training basically the physicians who practice with him in his office are seen his patient's. We can only give her 5 days per [facility] in law for urgent care of pain medication. In the she's coming back to see [place] every 5 days for long-term pain regulation of her ribs and of her arm she needs to follow-up with one of Dr. Chinita Greenland associate. We will x-ray the left humerus again and the left forearm will place on a sling but she'll need to  follow-up at that office for long-term pain medication. I will give her 15 Percocet tablets for pain today.   Patient still smokes. Mother father both had cancer. She has history of diabetes insipidus hypercholesterolemia and previous MI.      The history is provided by the patient. No language interpreter was used.  Arm Pain  This is a new problem. The current episode started more than 1 week ago. The problem occurs constantly. The problem has been gradually worsening. Associated symptoms include shortness of breath. Pertinent negatives include no chest pain, no abdominal pain and no headaches. The symptoms are aggravated by exertion. Nothing relieves the symptoms. The treatment provided no relief.    Past Medical History:  Diagnosis Date  . Diabetes insipidus ([place])   . Hypercholesteremia   . MI, old     Patient Active Problem List   Diagnosis Date Noted  . Vitamin D deficiency 01/07/2016  . Hyperlipidemia 01/02/2016  . Hypertension 01/02/2016  . Chronic pain 01/02/2016  . Overweight (BMI 25.0-29.9) 01/02/2016  . CAD (coronary artery disease) 01/02/2016  . IBS (irritable bowel syndrome) 01/02/2016  . FH: colon cancer 01/02/2016  . DDD (degenerative disc disease), cervical 01/02/2016  . Primary osteoarthritis of left knee 01/02/2016  . Smoker 01/02/2016  .  Acute MI, inferoposterior wall ([place]) 09/30/2014    Past Surgical History:  Procedure Laterality Date  . ABDOMINAL SURGERY    . APPENDECTOMY    . BLADDER NECK SUSPENSION    . cardiac stents    . HERNIA REPAIR    . LEFT HEART CATHETERIZATION WITH CORONARY ANGIOGRAM N/A 09/29/2014   Procedure: LEFT HEART CATHETERIZATION WITH CORONARY ANGIOGRAM;  Surgeon: Clent Demark, MD;  Location: [facility] CATH LAB;  Service: Cardiovascular;  Laterality: N/A;    OB History    No data available       Home Medications    Prior to Admission medications   Medication Sig Start Date End Date Taking? Authorizing Provider  aspirin 81  MG chewable tablet Chew 1 tablet (81 mg total) by mouth daily. 10/02/14  Yes Charolette Forward, MD  atorvastatin (LIPITOR) 80 MG tablet Take 0.5 tablets (40 mg total) by mouth daily at 6 PM. 01/02/16  Yes Adline Potter, MD  Cholecalciferol (VITAMIN D) 2000 units CAPS Take 1 capsule (2,000 Units total) by mouth daily. 01/07/16  Yes Adline Potter, MD  metoprolol tartrate (LOPRESSOR) 25 MG tablet Take 12.5 mg by mouth 2 (two) times daily.   Yes Historical Provider, MD  oxyCODONE-acetaminophen (ROXICET) 5-325 MG tablet Take 1-2 tablets by mouth every 4 (four) hours as needed for severe pain. 07/27/16  Yes Pierce Crane Beers, PA-C  clopidogrel (PLAVIX) 75 MG tablet Take 75 mg by mouth daily.    Historical Provider, MD  cyclobenzaprine (FLEXERIL) 5 MG tablet Take 1 tablet (5 mg total) by mouth 3 (three) times daily as needed for muscle spasms. 07/27/16   Pierce Crane Beers, PA-C  hyoscyamine (LEVSIN SL) 0.125 MG SL tablet Place 1 tablet (0.125 mg total) under the tongue every 4 (four) hours as needed. 03/09/16   Adline Potter, MD  meloxicam (MOBIC) 15 MG tablet Take 1 tablet (15 mg total) by mouth daily. 07/27/16   Pierce Crane Beers, PA-C  nitroGLYCERIN (NITROSTAT) 0.4 MG SL tablet Place 1 tablet (0.4 mg total) under the tongue every 5 (five) minutes x 3 doses as needed for chest pain. 10/02/14   Charolette Forward, MD  oxyCODONE-acetaminophen (PERCOCET/ROXICET) 5-325 MG tablet Take 1 tablet by mouth every 8 (eight) hours as needed for moderate pain or severe pain. 08/13/16   Frederich Cha, MD  traMADol (ULTRAM) 50 MG tablet Take 1 tablet (50 mg total) by mouth every 8 (eight) hours as needed. 01/02/16   Adline Potter, MD    Family History Family History  Problem Relation Age of Onset  . Cancer Mother   . Cancer Father     Social History Social History  Substance Use Topics  . Smoking status: Current Every Day Smoker    Packs/day: 0.50    Types: Cigarettes  . Smokeless tobacco: Never Used  . Alcohol use 0.0 oz/week      Comment: occassional     Allergies   Review of patient's allergies indicates no known allergies.   Review of Systems Review of Systems  Respiratory: Positive for shortness of breath.        She has pain over her right ribs.  Cardiovascular: Negative for chest pain.  Gastrointestinal: Negative for abdominal pain.  Musculoskeletal: Positive for arthralgias and myalgias.  Neurological: Negative for headaches.  All other systems reviewed and are negative.    Physical Exam Triage Vital Signs ED Triage Vitals  Enc Vitals Group     BP 08/13/16 1018 135/70     Pulse Rate  08/13/16 1018 75     Resp 08/13/16 1018 20     Temp 08/13/16 1018 98.1 F (36.7 C)     Temp Source 08/13/16 1018 Oral     SpO2 08/13/16 1018 99 %     Weight --      Height --      Head Circumference --      Peak Flow --      Pain Score 08/13/16 1024 5     Pain Loc --      Pain Edu? --      Excl. in [place]? --    No data found.   Updated Vital Signs BP 135/70 (BP Location: Right Arm)   Pulse 75   Temp 98.1 F (36.7 C) (Oral)   Resp 20   SpO2 99%   Visual Acuity Right Eye Distance:   Left Eye Distance:   Bilateral Distance:    Right Eye Near:   Left Eye Near:    Bilateral Near:     Physical Exam  Constitutional: She is oriented to person, place, and time. She appears well-developed and well-nourished. No distress.  HENT:  Head: Normocephalic and atraumatic.  Eyes: Pupils are equal, round, and reactive to light.  Neck: Normal range of motion. Neck supple.  Cardiovascular: Normal rate, regular rhythm and normal heart sounds.   Pulmonary/Chest: Effort normal and breath sounds normal. No respiratory distress.  Musculoskeletal: She exhibits tenderness. She exhibits no deformity.       Left wrist: She exhibits tenderness. She exhibits no effusion, no deformity and no laceration.       Left upper arm: She exhibits tenderness and bony tenderness. She exhibits no deformity and no laceration.        Arms: Patient is tender over the left forearm there is no tenderness in the navicular area she is able to raise her arm above her headaches though slowly she has tenderness in the mid humeral shaft.  Neurological: She is alert and oriented to person, place, and time.  Skin: Skin is warm. She is not diaphoretic.  Psychiatric: Her behavior is normal. Her mood appears anxious.  Vitals reviewed.    UC Treatments / Results  Labs (all labs ordered are listed, but only abnormal results are displayed) Labs Reviewed - No data to display  EKG  EKG Interpretation None       Radiology Dg Forearm Left  Result Date: 08/13/2016 CLINICAL DATA:  [place] several weeks ago with pain EXAM: LEFT FOREARM - 2 VIEW COMPARISON:  Left humerus films of 07/27/2016 FINDINGS: There is a slightly impacted fracture of the left radial neck with minimal angulation. Otherwise the left radius and ulna are intact. IMPRESSION: Slightly impacted minimally angulated fracture of the left radial neck. Electronically Signed   By: Ivar Drape M.D.   On: 08/13/2016 11:47   Dg Humerus Left  Result Date: 08/13/2016 CLINICAL DATA:  [place] several weeks ago, now with left arm pain EXAM: LEFT HUMERUS - 2+ VIEW COMPARISON:  None. FINDINGS: The left humerus appears intact. The glenohumeral joint space appears normal. The elbow is unremarkable. IMPRESSION: Negative. Electronically Signed   By: Ivar Drape M.D.   On: 08/13/2016 11:44    Procedures Procedures (including critical care time)  Medications Ordered in UC Medications - No data to display   Initial Impression / Assessment and Plan / UC Course  I have reviewed the triage vital signs and the nursing notes.  Pertinent labs & imaging results that were  available during my care of the patient were reviewed by me and considered in my medical decision making (see chart for details).  Clinical Course  Value Comment By Time  DG Forearm Left (Reviewed) Frederich Cha, MD 08/24 1158    DG Forearm Left (Reviewed) Frederich Cha, MD 08/24 1158  If x-rays of the humerus and the former negative as expected we'll plan to place on 5 days with Percocet follow up with Dr. Hayden Pedro office for follow-up  Final Clinical Impressions(s) / UC Diagnoses   Final diagnoses:  Left arm pain  Radial fracture, left, closed, initial encounter    Patient did have a fracture of the proximal radial head no displacement radiologist agrees that there no significant displacement. We'll place patient sugar tong splint will continue proceed with sling is mentioned above and will place him the Percocet but will make a referral to our recommend she call Dr. Jefm Bryant office for follow-up next week.   New Prescriptions New Prescriptions   OXYCODONE-ACETAMINOPHEN (PERCOCET/ROXICET) 5-325 MG TABLET    Take 1 tablet by mouth every 8 (eight) hours as needed for moderate pain or severe pain.      Note: This dictation was prepared with Dragon dictation along with smaller phrase technology. Any transcriptional errors that result from this process are unintentional.   Frederich Cha, MD 08/13/16 1224

## 2016-08-13 NOTE — ED Triage Notes (Signed)
Patient is having symptoms of severe left arm pain since her fall on 07/24/16. Patient did go to [facility] ed on 07/27/16 for treatment. Patient reports left arm pain has become worse and that she is having bilateral rib pain just below her breast.

## 2016-08-18 DIAGNOSIS — Z8781 Personal history of (healed) traumatic fracture: Secondary | ICD-10-CM | POA: Insufficient documentation

## 2016-09-04 ENCOUNTER — Ambulatory Visit (INDEPENDENT_AMBULATORY_CARE_PROVIDER_SITE_OTHER): Payer: Medicare Other | Admitting: Family Medicine

## 2016-09-04 ENCOUNTER — Encounter: Payer: Self-pay | Admitting: Family Medicine

## 2016-09-04 VITALS — BP 102/70 | HR 84 | Ht 65.0 in | Wt 178.0 lb

## 2016-09-04 DIAGNOSIS — I25119 Atherosclerotic heart disease of native coronary artery with unspecified angina pectoris: Secondary | ICD-10-CM | POA: Diagnosis not present

## 2016-09-04 DIAGNOSIS — L509 Urticaria, unspecified: Secondary | ICD-10-CM | POA: Diagnosis not present

## 2016-09-04 MED ORDER — PREDNISONE 10 MG PO TABS: ORAL_TABLET | ORAL | 0 refills | Status: DC

## 2016-09-04 MED ORDER — TRIAMCINOLONE ACETONIDE 0.025 % EX OINT: 1.0000 "application " | TOPICAL_OINTMENT | Freq: Two times a day (BID) | CUTANEOUS | 0 refills | Status: DC

## 2016-09-04 NOTE — Patient Instructions (Signed)
Hives Hives are itchy, red, swollen areas of the skin. They can vary in size and location on your body. Hives can come and go for hours or several days (acute hives) or for several weeks (chronic hives). Hives do not spread from person to person (noncontagious). They may get worse with scratching, exercise, and emotional stress. CAUSES   Allergic reaction to food, additives, or drugs.  Infections, including the common cold.  Illness, such as vasculitis, lupus, or thyroid disease.  Exposure to sunlight, heat, or cold.  Exercise.  Stress.  Contact with chemicals. SYMPTOMS   Red or white swollen patches on the skin. The patches may change size, shape, and location quickly and repeatedly.  Itching.  Swelling of the hands, feet, and face. This may occur if hives develop deeper in the skin. DIAGNOSIS  Your caregiver can usually tell what is wrong by performing a physical exam. Skin or blood tests may also be done to determine the cause of your hives. In some cases, the cause cannot be determined. TREATMENT  Mild cases usually get better with medicines such as antihistamines. Severe cases may require an emergency epinephrine injection. If the cause of your hives is known, treatment includes avoiding that trigger.  HOME CARE INSTRUCTIONS   Avoid causes that trigger your hives.  Take antihistamines as directed by your caregiver to reduce the severity of your hives. Non-sedating or low-sedating antihistamines are usually recommended. Do not drive while taking an antihistamine.  Take any other medicines prescribed for itching as directed by your caregiver.  Wear loose-fitting clothing.  Keep all follow-up appointments as directed by your caregiver. SEEK MEDICAL CARE IF:   You have persistent or severe itching that is not relieved with medicine.  You have painful or swollen joints. SEEK IMMEDIATE MEDICAL CARE IF:   You have a fever.  Your tongue or lips are swollen.  You have  trouble breathing or swallowing.  You feel tightness in the throat or chest.  You have abdominal pain. These problems may be the first sign of a life-threatening allergic reaction. Call your local emergency services (911 in [place] MAKE SURE YOU:   Understand these instructions.  Will watch your condition.  Will get help right away if you are not doing well or get worse.   This information is not intended to replace advice given to you by your health care provider. Make sure you discuss any questions you have with your health care provider.   Document Released: 12/07/2005 Document Revised: 12/12/2013 Document Reviewed: 03/01/2012 Elsevier Interactive Patient Education 2016 Elsevier Inc.  

## 2016-09-04 NOTE — Progress Notes (Signed)
Name: Kristi [name]   MRN: [id number]    DOB: 06/21/1946   Date:09/04/2016       Progress Note  Subjective  Chief Complaint  Chief Complaint  Patient presents with  . Rash    head and neck itching x 1 month- family can't see anything wrong    Rash  This is a new problem. The current episode started more than 1 month ago. The problem has been waxing and waning since onset. The affected locations include the scalp. The rash is characterized by itchiness and redness. Pertinent negatives include no anorexia, congestion, cough, diarrhea, eye pain, facial edema, fatigue, fever, joint pain, nail changes, rhinorrhea, shortness of breath, sore throat or vomiting. Past treatments include anti-itch cream and antihistamine. The treatment provided mild relief. There is no history of allergies.    No problem-specific Assessment & Plan notes found for this encounter.   Past Medical History:  Diagnosis Date  . Diabetes insipidus ([place])   . Hypercholesteremia   . MI, old     Past Surgical History:  Procedure Laterality Date  . ABDOMINAL SURGERY    . APPENDECTOMY    . BLADDER NECK SUSPENSION    . cardiac stents    . HERNIA REPAIR    . LEFT HEART CATHETERIZATION WITH CORONARY ANGIOGRAM N/A 09/29/2014   Procedure: LEFT HEART CATHETERIZATION WITH CORONARY ANGIOGRAM;  Surgeon: Clent Demark, MD;  Location: [facility] CATH LAB;  Service: Cardiovascular;  Laterality: N/A;    Family History  Problem Relation Age of Onset  . Cancer Mother   . Cancer Father     Social History   Social History  . Marital status: Married    Spouse name: N/A  . Number of children: N/A  . Years of education: N/A   Occupational History  . Not on file.   Social History Main Topics  . Smoking status: Current Every Day Smoker    Packs/day: 0.50    Types: Cigarettes  . Smokeless tobacco: Never Used  . Alcohol use 0.0 oz/week     Comment: occassional  . Drug use: No  . Sexual activity: Yes    Birth control/  protection: None   Other Topics Concern  . Not on file   Social History Narrative  . No narrative on file    No Known Allergies   Review of Systems  Constitutional: Negative for chills, fatigue, fever, malaise/fatigue and weight loss.  HENT: Negative for congestion, ear discharge, ear pain, rhinorrhea and sore throat.   Eyes: Negative for blurred vision and pain.  Respiratory: Negative for cough, sputum production, shortness of breath and wheezing.   Cardiovascular: Negative for chest pain, palpitations and leg swelling.  Gastrointestinal: Negative for abdominal pain, anorexia, blood in stool, constipation, diarrhea, heartburn, melena, nausea and vomiting.  Genitourinary: Negative for dysuria, frequency, hematuria and urgency.  Musculoskeletal: Negative for back pain, joint pain, myalgias and neck pain.  Skin: Positive for rash. Negative for nail changes.  Neurological: Negative for dizziness, tingling, sensory change, focal weakness and headaches.  Endo/Heme/Allergies: Negative for environmental allergies and polydipsia. Does not bruise/bleed easily.  Psychiatric/Behavioral: Negative for depression and suicidal ideas. The patient is not nervous/anxious and does not have insomnia.      Objective  Vitals:   09/04/16 0804  BP: 102/70  Pulse: 84  Weight: 178 lb (80.7 kg)  Height: '5\' 5"'$  (1.651 m)    Physical Exam  Constitutional: She is well-developed, well-nourished, and in no distress. No distress.  HENT:  Head: Normocephalic and atraumatic.  Right Ear: External ear normal.  Left Ear: External ear normal.  Nose: Nose normal.  Mouth/Throat: Oropharynx is clear and moist.  Eyes: Conjunctivae and EOM are normal. Pupils are equal, round, and reactive to light. Right eye exhibits no discharge. Left eye exhibits no discharge.  Neck: Normal range of motion. Neck supple. No JVD present. No thyromegaly present.  Cardiovascular: Normal rate, regular rhythm, normal heart sounds and  intact distal pulses.  Exam reveals no gallop and no friction rub.   No murmur heard. Pulmonary/Chest: Effort normal and breath sounds normal.  Abdominal: Soft. Bowel sounds are normal. She exhibits no mass. There is no tenderness. There is no guarding.  Musculoskeletal: Normal range of motion. She exhibits no edema.  Lymphadenopathy:    She has no cervical adenopathy.  Neurological: She is alert. She has normal reflexes.  Skin: Skin is warm and dry. Rash noted. Rash is urticarial. She is not diaphoretic. There is erythema.  Psychiatric: Mood and affect normal.  Nursing note and vitals reviewed.     Assessment & Plan  Problem List Items Addressed This Visit    None    Visit Diagnoses    Urticaria    -  Primary   loratadine/ benedryl   Relevant Medications   predniSONE (DELTASONE) 10 MG tablet   triamcinolone (KENALOG) 0.025 % ointment        Dr. Macon [facility]  09/04/16

## 2016-10-16 DIAGNOSIS — I252 Old myocardial infarction: Secondary | ICD-10-CM | POA: Diagnosis not present

## 2016-10-16 DIAGNOSIS — E785 Hyperlipidemia, unspecified: Secondary | ICD-10-CM | POA: Diagnosis not present

## 2016-10-16 DIAGNOSIS — F17290 Nicotine dependence, other tobacco product, uncomplicated: Secondary | ICD-10-CM | POA: Diagnosis not present

## 2016-10-16 DIAGNOSIS — I10 Essential (primary) hypertension: Secondary | ICD-10-CM | POA: Diagnosis not present

## 2016-10-16 DIAGNOSIS — R0789 Other chest pain: Secondary | ICD-10-CM | POA: Diagnosis not present

## 2016-10-16 DIAGNOSIS — I2510 Atherosclerotic heart disease of native coronary artery without angina pectoris: Secondary | ICD-10-CM | POA: Diagnosis not present

## 2016-10-16 DIAGNOSIS — E559 Vitamin D deficiency, unspecified: Secondary | ICD-10-CM | POA: Diagnosis not present

## 2016-11-10 ENCOUNTER — Other Ambulatory Visit: Payer: Self-pay

## 2016-11-10 DIAGNOSIS — L509 Urticaria, unspecified: Secondary | ICD-10-CM

## 2016-11-11 DIAGNOSIS — L0212 Furuncle of neck: Secondary | ICD-10-CM | POA: Diagnosis not present

## 2016-11-11 DIAGNOSIS — L02821 Furuncle of head [any part, except face]: Secondary | ICD-10-CM | POA: Diagnosis not present

## 2016-12-02 DIAGNOSIS — L02821 Furuncle of head [any part, except face]: Secondary | ICD-10-CM | POA: Diagnosis not present

## 2016-12-02 DIAGNOSIS — L0212 Furuncle of neck: Secondary | ICD-10-CM | POA: Diagnosis not present

## 2016-12-02 DIAGNOSIS — L0202 Furuncle of face: Secondary | ICD-10-CM | POA: Diagnosis not present

## 2017-01-29 DIAGNOSIS — I252 Old myocardial infarction: Secondary | ICD-10-CM | POA: Diagnosis not present

## 2017-01-29 DIAGNOSIS — I2510 Atherosclerotic heart disease of native coronary artery without angina pectoris: Secondary | ICD-10-CM | POA: Diagnosis not present

## 2017-01-29 DIAGNOSIS — I10 Essential (primary) hypertension: Secondary | ICD-10-CM | POA: Diagnosis not present

## 2017-01-29 DIAGNOSIS — E559 Vitamin D deficiency, unspecified: Secondary | ICD-10-CM | POA: Diagnosis not present

## 2017-01-29 DIAGNOSIS — E785 Hyperlipidemia, unspecified: Secondary | ICD-10-CM | POA: Diagnosis not present

## 2017-01-29 DIAGNOSIS — F17290 Nicotine dependence, other tobacco product, uncomplicated: Secondary | ICD-10-CM | POA: Diagnosis not present

## 2017-05-14 DIAGNOSIS — I10 Essential (primary) hypertension: Secondary | ICD-10-CM | POA: Diagnosis not present

## 2017-05-14 DIAGNOSIS — E559 Vitamin D deficiency, unspecified: Secondary | ICD-10-CM | POA: Diagnosis not present

## 2017-05-14 DIAGNOSIS — E785 Hyperlipidemia, unspecified: Secondary | ICD-10-CM | POA: Diagnosis not present

## 2017-05-14 DIAGNOSIS — I252 Old myocardial infarction: Secondary | ICD-10-CM | POA: Diagnosis not present

## 2017-05-14 DIAGNOSIS — I2510 Atherosclerotic heart disease of native coronary artery without angina pectoris: Secondary | ICD-10-CM | POA: Diagnosis not present

## 2017-05-14 DIAGNOSIS — F17290 Nicotine dependence, other tobacco product, uncomplicated: Secondary | ICD-10-CM | POA: Diagnosis not present

## 2017-07-12 ENCOUNTER — Other Ambulatory Visit: Payer: Self-pay

## 2017-10-18 ENCOUNTER — Other Ambulatory Visit: Payer: Self-pay | Admitting: Cardiology

## 2017-10-18 DIAGNOSIS — I252 Old myocardial infarction: Secondary | ICD-10-CM | POA: Diagnosis not present

## 2017-10-18 DIAGNOSIS — I10 Essential (primary) hypertension: Secondary | ICD-10-CM | POA: Diagnosis not present

## 2017-10-18 DIAGNOSIS — I25119 Atherosclerotic heart disease of native coronary artery with unspecified angina pectoris: Secondary | ICD-10-CM | POA: Diagnosis not present

## 2017-10-18 DIAGNOSIS — E559 Vitamin D deficiency, unspecified: Secondary | ICD-10-CM | POA: Diagnosis not present

## 2017-10-18 DIAGNOSIS — F17290 Nicotine dependence, other tobacco product, uncomplicated: Secondary | ICD-10-CM | POA: Diagnosis not present

## 2017-10-18 DIAGNOSIS — E785 Hyperlipidemia, unspecified: Secondary | ICD-10-CM | POA: Diagnosis not present

## 2017-10-18 DIAGNOSIS — R079 Chest pain, unspecified: Secondary | ICD-10-CM

## 2017-11-08 ENCOUNTER — Ambulatory Visit (HOSPITAL_COMMUNITY)
Admission: RE | Admit: 2017-11-08 | Discharge: 2017-11-08 | Disposition: A | Discharge status: Home / Self Care | Payer: Medicare Other | Source: Ambulatory Visit | Attending: Cardiology | Admitting: Cardiology

## 2017-11-08 DIAGNOSIS — R0602 Shortness of breath: Secondary | ICD-10-CM | POA: Diagnosis not present

## 2017-11-08 DIAGNOSIS — E785 Hyperlipidemia, unspecified: Secondary | ICD-10-CM | POA: Diagnosis not present

## 2017-11-08 DIAGNOSIS — R079 Chest pain, unspecified: Secondary | ICD-10-CM | POA: Insufficient documentation

## 2017-11-08 DIAGNOSIS — I252 Old myocardial infarction: Secondary | ICD-10-CM | POA: Insufficient documentation

## 2017-11-08 DIAGNOSIS — I10 Essential (primary) hypertension: Secondary | ICD-10-CM | POA: Diagnosis not present

## 2017-11-08 DIAGNOSIS — R9431 Abnormal electrocardiogram [ECG] [EKG]: Secondary | ICD-10-CM | POA: Diagnosis not present

## 2017-11-08 DIAGNOSIS — I25119 Atherosclerotic heart disease of native coronary artery with unspecified angina pectoris: Secondary | ICD-10-CM | POA: Diagnosis not present

## 2017-11-08 MED ORDER — TECHNETIUM TC 99M TETROFOSMIN IV KIT
10.0000 | PACK | Freq: Once | INTRAVENOUS | Status: AC | PRN
  Administered 2017-11-08: 10 via INTRAVENOUS

## 2017-11-08 MED ORDER — REGADENOSON 0.4 MG/5ML IV SOLN
0.4000 mg | Freq: Once | INTRAVENOUS | Status: AC
  Administered 2017-11-08: 0.4 mg via INTRAVENOUS

## 2017-11-08 MED ORDER — TECHNETIUM TC 99M TETROFOSMIN IV KIT
30.0000 | PACK | Freq: Once | INTRAVENOUS | Status: AC | PRN
  Administered 2017-11-08: 30 via INTRAVENOUS

## 2017-11-08 MED ORDER — REGADENOSON 0.4 MG/5ML IV SOLN
INTRAVENOUS | Status: AC
  Filled 2017-11-08: qty 5

## 2018-01-06 DIAGNOSIS — E559 Vitamin D deficiency, unspecified: Secondary | ICD-10-CM | POA: Diagnosis not present

## 2018-01-06 DIAGNOSIS — I10 Essential (primary) hypertension: Secondary | ICD-10-CM | POA: Diagnosis not present

## 2018-01-06 DIAGNOSIS — E785 Hyperlipidemia, unspecified: Secondary | ICD-10-CM | POA: Diagnosis not present

## 2018-01-06 DIAGNOSIS — I2510 Atherosclerotic heart disease of native coronary artery without angina pectoris: Secondary | ICD-10-CM | POA: Diagnosis not present

## 2018-01-06 LAB — LIPID PANEL
CHOLESTEROL: 181 (ref 0–200)
HDL: 59 (ref 35–70)
LDL Cholesterol: 101
TRIGLYCERIDES: 103 (ref 40–160)

## 2018-01-06 LAB — BASIC METABOLIC PANEL
BUN: 11 (ref 4–21)
Creatinine: 0.7 (ref 0.5–1.1)

## 2018-01-06 LAB — HEPATIC FUNCTION PANEL
ALT: 30 (ref 7–35)
AST: 28 (ref 13–35)

## 2018-01-06 LAB — VITAMIN D 25 HYDROXY (VIT D DEFICIENCY, FRACTURES): VIT D 25 HYDROXY: 32.5

## 2018-01-17 DIAGNOSIS — E559 Vitamin D deficiency, unspecified: Secondary | ICD-10-CM | POA: Diagnosis not present

## 2018-01-17 DIAGNOSIS — E785 Hyperlipidemia, unspecified: Secondary | ICD-10-CM | POA: Diagnosis not present

## 2018-01-17 DIAGNOSIS — R55 Syncope and collapse: Secondary | ICD-10-CM | POA: Diagnosis not present

## 2018-01-17 DIAGNOSIS — I252 Old myocardial infarction: Secondary | ICD-10-CM | POA: Diagnosis not present

## 2018-01-17 DIAGNOSIS — I10 Essential (primary) hypertension: Secondary | ICD-10-CM | POA: Diagnosis not present

## 2018-01-17 DIAGNOSIS — F17290 Nicotine dependence, other tobacco product, uncomplicated: Secondary | ICD-10-CM | POA: Diagnosis not present

## 2018-01-17 DIAGNOSIS — I2510 Atherosclerotic heart disease of native coronary artery without angina pectoris: Secondary | ICD-10-CM | POA: Diagnosis not present

## 2018-02-02 DIAGNOSIS — E785 Hyperlipidemia, unspecified: Secondary | ICD-10-CM | POA: Diagnosis not present

## 2018-02-02 DIAGNOSIS — I2510 Atherosclerotic heart disease of native coronary artery without angina pectoris: Secondary | ICD-10-CM | POA: Diagnosis not present

## 2018-02-02 DIAGNOSIS — I10 Essential (primary) hypertension: Secondary | ICD-10-CM | POA: Diagnosis not present

## 2018-02-02 DIAGNOSIS — R55 Syncope and collapse: Secondary | ICD-10-CM | POA: Diagnosis not present

## 2018-02-02 DIAGNOSIS — F17290 Nicotine dependence, other tobacco product, uncomplicated: Secondary | ICD-10-CM | POA: Diagnosis not present

## 2018-02-02 DIAGNOSIS — E559 Vitamin D deficiency, unspecified: Secondary | ICD-10-CM | POA: Diagnosis not present

## 2018-02-02 DIAGNOSIS — I252 Old myocardial infarction: Secondary | ICD-10-CM | POA: Diagnosis not present

## 2018-02-08 ENCOUNTER — Ambulatory Visit (INDEPENDENT_AMBULATORY_CARE_PROVIDER_SITE_OTHER): Payer: Medicare Other | Admitting: Family Medicine

## 2018-02-08 ENCOUNTER — Other Ambulatory Visit: Payer: Self-pay

## 2018-02-08 ENCOUNTER — Encounter: Payer: Self-pay | Admitting: Family Medicine

## 2018-02-08 VITALS — BP 118/78 | HR 86 | Resp 16 | Ht 65.0 in | Wt 185.2 lb

## 2018-02-08 DIAGNOSIS — E669 Obesity, unspecified: Secondary | ICD-10-CM

## 2018-02-08 DIAGNOSIS — F17200 Nicotine dependence, unspecified, uncomplicated: Secondary | ICD-10-CM | POA: Diagnosis not present

## 2018-02-08 DIAGNOSIS — R7303 Prediabetes: Secondary | ICD-10-CM

## 2018-02-08 DIAGNOSIS — E559 Vitamin D deficiency, unspecified: Secondary | ICD-10-CM | POA: Diagnosis not present

## 2018-02-08 DIAGNOSIS — I10 Essential (primary) hypertension: Secondary | ICD-10-CM | POA: Diagnosis not present

## 2018-02-08 DIAGNOSIS — E785 Hyperlipidemia, unspecified: Secondary | ICD-10-CM

## 2018-02-08 DIAGNOSIS — I25119 Atherosclerotic heart disease of native coronary artery with unspecified angina pectoris: Secondary | ICD-10-CM | POA: Diagnosis not present

## 2018-02-08 DIAGNOSIS — K589 Irritable bowel syndrome without diarrhea: Secondary | ICD-10-CM

## 2018-02-08 DIAGNOSIS — L509 Urticaria, unspecified: Secondary | ICD-10-CM

## 2018-02-08 DIAGNOSIS — L219 Seborrheic dermatitis, unspecified: Secondary | ICD-10-CM | POA: Diagnosis not present

## 2018-02-08 DIAGNOSIS — Z0000 Encounter for general adult medical examination without abnormal findings: Secondary | ICD-10-CM

## 2018-02-08 MED ORDER — TRIAMCINOLONE ACETONIDE 0.025 % EX OINT: 1.0000 "application " | TOPICAL_OINTMENT | Freq: Two times a day (BID) | CUTANEOUS | 2 refills | Status: DC

## 2018-02-09 NOTE — Progress Notes (Signed)
Date:  02/08/2018   Name:  Kristi [name] [facility]   DOB:  1946/02/25   MRN:  [id number]  PCP:  Adline Potter, MD    Chief Complaint: Coronary Artery Disease   History of Present Illness:  This is a 72 y.o. female seen for two year f/u, was seen by Dr. Ronnald Ramp 08/2016 for urticaria rx'd with Claritin and TAC. C/o pruritic rash back of neck x yrs, saw derm, told to use Benedryl and special shampoo but too expensive. Concerned about inability to lose weight despite diet/exercise 3x/wk,.saw diet MD in past, has tried multiple dietary supplements. Would also like to quit smoking, has rx for Wellbutrin but never filled, husband smokes as well. CAD stable, not using NTG, to see cards next week. Also plans to see GI for colonoscopy soon. Taking B complex for energy.  Review of Systems:  Review of Systems  Constitutional: Negative for chills and fever.  Respiratory: Negative for cough and shortness of breath.   Cardiovascular: Negative for chest pain and leg swelling.  Genitourinary: Negative for difficulty urinating.  Neurological: Negative for syncope and light-headedness.    Patient Active Problem List   Diagnosis Date Noted  . Prediabetes 02/08/2018  . Closed fracture of proximal end of left radius and ulna 08/18/2016  . Vitamin D deficiency 01/07/2016  . Hyperlipidemia 01/02/2016  . Hypertension 01/02/2016  . Chronic pain 01/02/2016  . Obesity (BMI 30.0-34.9) 01/02/2016  . CAD (coronary artery disease) 01/02/2016  . IBS (irritable bowel syndrome) 01/02/2016  . FH: colon cancer 01/02/2016  . DDD (degenerative disc disease), cervical 01/02/2016  . Primary osteoarthritis of left knee 01/02/2016  . Smoker 01/02/2016  . Acute MI, inferoposterior wall ([place]) 09/30/2014    Prior to Admission medications   Medication Sig Start Date End Date Taking? Authorizing Provider  aspirin 81 MG chewable tablet Chew 1 tablet (81 mg total) by mouth daily. 10/02/14  Yes Charolette Forward, MD  atorvastatin  (LIPITOR) 80 MG tablet Take 0.5 tablets (40 mg total) by mouth daily at 6 PM. 01/02/16  Yes Kataya Guimont, Gwyndolyn Saxon, MD  B Complex Vitamins (VITAMIN B COMPLEX PO) Take by mouth.   Yes [provider]  cetirizine (ZYRTEC) 10 MG tablet Take 10 mg by mouth daily.   Yes [provider]  Cholecalciferol (VITAMIN D) 2000 units CAPS Take 1 capsule (2,000 Units total) by mouth daily. 01/07/16  Yes Johanna Stafford, Gwyndolyn Saxon, MD  metoprolol tartrate (LOPRESSOR) 25 MG tablet Take 12.5 mg by mouth 2 (two) times daily.   Yes [provider]  nitroGLYCERIN (NITROSTAT) 0.4 MG SL tablet Place 1 tablet (0.4 mg total) under the tongue every 5 (five) minutes x 3 doses as needed for chest pain. 10/02/14  Yes Charolette Forward, MD  triamcinolone (KENALOG) 0.025 % ointment Apply 1 application topically 2 (two) times daily. 02/08/18   Adline Potter, MD    No Known Allergies  Past Surgical History:  Procedure Laterality Date  . ABDOMINAL SURGERY    . APPENDECTOMY    . BLADDER NECK SUSPENSION    . cardiac stents    . HERNIA REPAIR    . LEFT HEART CATHETERIZATION WITH CORONARY ANGIOGRAM N/A 09/29/2014   Procedure: LEFT HEART CATHETERIZATION WITH CORONARY ANGIOGRAM;  Surgeon: Clent Demark, MD;  Location[facility] C[facility] LAB;  Service: Cardiovascular;  Laterality: N/A;    Social History   Tobacco Use  . Smoking status: Current Every Day Smoker    Packs/day: 0.50    Types: Cigarettes  . Smokeless tobacco:  Never Used  Substance Use Topics  . Alcohol use: Yes    Alcohol/week: 0.0 oz    Comment: occassional  . Drug use: No    Family History  Problem Relation Age of Onset  . Cancer Mother   . Cancer Father     Medication list has been reviewed and updated.  Physical Examination: BP 118/78   Pulse 86   Resp 16   Ht 5\' 5"  (1.651 m)   Wt 185 lb 3.2 oz (84 kg)   SpO2 98%   BMI 30.82 kg/m   Physical Exam  Constitutional: She appears well-developed and well-nourished.  Cardiovascular: Normal rate,  regular rhythm and normal heart sounds.  Pulmonary/Chest: Effort normal and breath sounds normal.  Musculoskeletal: She exhibits no edema.  Neurological: She is alert.  Skin: Skin is warm and dry.  Erythematous rash at hairline posterior neck  Psychiatric: She has a normal mood and affect. Her behavior is normal.  Nursing note and vitals reviewed.   Assessment and Plan:  1. Coronary artery disease involving native coronary artery of native heart with angina pectoris (HCC) Stable on BB/statin/asa, to see cards next week  2. Essential hypertension Well controlled on metoprolol, consider change to succinate - TSH  3. Irritable bowel syndrome, unspecified type Improved, to see GI for colonoscopy soon (pt to arrange)  4. Hyperlipidemia, unspecified hyperlipidemia type On Lipitor, recheck lipids next visit  5. Prediabetes - HgB A1c  6. Seborrheic dermatitis Zyrtec/TAC, call if ineffective  7. Smoker Discussed cessation strategies, encouraged to quit with spouse  8. Obesity (BMI 30.0-34.9) Discussed weight loss strategies, recommend increase exercise to daily  9. Vitamin D deficiency On supplement, check level next visit  10. Healthcare maintenance - Hepatitis C Antibody  Return in about 4 weeks (around 03/08/2018).  Satira Anis. [facility]  02/09/2018

## 2018-02-18 DIAGNOSIS — I10 Essential (primary) hypertension: Secondary | ICD-10-CM | POA: Diagnosis not present

## 2018-02-18 DIAGNOSIS — R7303 Prediabetes: Secondary | ICD-10-CM | POA: Diagnosis not present

## 2018-02-18 DIAGNOSIS — Z0000 Encounter for general adult medical examination without abnormal findings: Secondary | ICD-10-CM | POA: Diagnosis not present

## 2018-02-19 LAB — HEMOGLOBIN A1C
ESTIMATED AVERAGE GLUCOSE: 114 mg/dL
Hgb A1c MFr Bld: 5.6 % (ref 4.8–5.6)

## 2018-02-19 LAB — HEPATITIS C ANTIBODY

## 2018-02-19 LAB — TSH: TSH: 1.22 u[IU]/mL (ref 0.450–4.500)

## 2018-03-11 ENCOUNTER — Ambulatory Visit: Payer: 59 | Admitting: Family Medicine

## 2018-04-18 DIAGNOSIS — E785 Hyperlipidemia, unspecified: Secondary | ICD-10-CM | POA: Diagnosis not present

## 2018-04-18 DIAGNOSIS — F17290 Nicotine dependence, other tobacco product, uncomplicated: Secondary | ICD-10-CM | POA: Diagnosis not present

## 2018-04-18 DIAGNOSIS — I10 Essential (primary) hypertension: Secondary | ICD-10-CM | POA: Diagnosis not present

## 2018-04-18 DIAGNOSIS — E559 Vitamin D deficiency, unspecified: Secondary | ICD-10-CM | POA: Diagnosis not present

## 2018-04-18 DIAGNOSIS — I2510 Atherosclerotic heart disease of native coronary artery without angina pectoris: Secondary | ICD-10-CM | POA: Diagnosis not present

## 2018-04-18 DIAGNOSIS — I252 Old myocardial infarction: Secondary | ICD-10-CM | POA: Diagnosis not present

## 2018-05-01 ENCOUNTER — Encounter: Payer: Self-pay | Admitting: Internal Medicine

## 2018-05-02 ENCOUNTER — Ambulatory Visit: Payer: Medicare Other | Admitting: Internal Medicine

## 2018-05-10 ENCOUNTER — Encounter: Payer: Self-pay | Admitting: Internal Medicine

## 2018-05-10 ENCOUNTER — Ambulatory Visit (INDEPENDENT_AMBULATORY_CARE_PROVIDER_SITE_OTHER): Payer: Medicare Other | Admitting: Internal Medicine

## 2018-05-10 VITALS — BP 117/81 | HR 74 | Resp 16 | Ht 65.0 in | Wt 184.0 lb

## 2018-05-10 DIAGNOSIS — R05 Cough: Secondary | ICD-10-CM

## 2018-05-10 DIAGNOSIS — I25119 Atherosclerotic heart disease of native coronary artery with unspecified angina pectoris: Secondary | ICD-10-CM | POA: Diagnosis not present

## 2018-05-10 DIAGNOSIS — M1712 Unilateral primary osteoarthritis, left knee: Secondary | ICD-10-CM | POA: Diagnosis not present

## 2018-05-10 DIAGNOSIS — M549 Dorsalgia, unspecified: Secondary | ICD-10-CM | POA: Diagnosis not present

## 2018-05-10 DIAGNOSIS — R059 Cough, unspecified: Secondary | ICD-10-CM

## 2018-05-10 DIAGNOSIS — E2839 Other primary ovarian failure: Secondary | ICD-10-CM

## 2018-05-10 DIAGNOSIS — I10 Essential (primary) hypertension: Secondary | ICD-10-CM

## 2018-05-10 NOTE — Progress Notes (Signed)
Date:  05/10/2018   Name:  Kristi [name] [facility]   DOB:  [birth year]   MRN:  [id number]   Chief Complaint: Gait Problem (balance issues when she bends over gets dizzy. ) and Cough (Has had coughing spells where she can not drink or breath well for months. Feels like something blocks her airway when it happens. )  Cough  This is a chronic problem. Episode frequency: about once day. The cough is non-productive. Associated symptoms include nasal congestion, shortness of breath and wheezing. Pertinent negatives include no chest pain, chills, headaches, postnasal drip or weight loss. Associated symptoms comments: During the cough episode. Risk factors for lung disease include smoking/tobacco exposure. She has tried nothing for the symptoms. Her past medical history is significant for environmental allergies.  Hypertension  This is a chronic problem. The problem is controlled. Associated symptoms include shortness of breath. Pertinent negatives include no chest pain, headaches or palpitations. Hypertensive end-organ damage includes CAD/MI.  Back Pain  This is a recurrent problem. The problem occurs intermittently. The pain is present in the thoracic spine. The quality of the pain is described as aching. The symptoms are aggravated by standing (prolonged standing). Pertinent negatives include no abdominal pain, chest pain, headaches, perianal numbness, weakness or weight loss.   Balance issues - she has trouble when she bends over with losing her balance.  She has not fallen - always managed to catch herself.  She has OA of the left knee which is moderate.  Also mid back pain and long hx of lumbar DDD.  She denies dizziness, HA, chest pain.   Review of Systems  Constitutional: Negative for chills, fatigue and weight loss.  HENT: Negative for postnasal drip.   Respiratory: Positive for cough, shortness of breath and wheezing.   Cardiovascular: Negative for chest pain, palpitations and leg swelling.    Gastrointestinal: Negative for abdominal pain, blood in stool and constipation.  Musculoskeletal: Positive for arthralgias, back pain and gait problem.  Allergic/Immunologic: Positive for environmental allergies.  Neurological: Negative for dizziness, weakness, light-headedness and headaches.  Psychiatric/Behavioral: Negative for decreased concentration and sleep disturbance. The patient is not nervous/anxious.     Patient Active Problem List   Diagnosis Date Noted  . Prediabetes 02/08/2018  . Hx of fracture of radius 08/18/2016  . Vitamin D deficiency 01/07/2016  . Hyperlipidemia 01/02/2016  . Hypertension 01/02/2016  . Chronic pain 01/02/2016  . Obesity (BMI 30.0-34.9) 01/02/2016  . CAD (coronary artery disease) 01/02/2016  . IBS (irritable bowel syndrome) 01/02/2016  . FH: colon cancer 01/02/2016  . DDD (degenerative disc disease), cervical 01/02/2016  . Primary osteoarthritis of left knee 01/02/2016  . Smoker 01/02/2016    Prior to Admission medications   Medication Sig Start Date End Date Taking? Authorizing Provider  aspirin 81 MG chewable tablet Chew 1 tablet (81 mg total) by mouth daily. 10/02/14  Yes Charolette Forward, MD  atorvastatin (LIPITOR) 80 MG tablet Take 0.5 tablets (40 mg total) by mouth daily at 6 PM. 01/02/16  Yes Plonk, Gwyndolyn Saxon, MD  B Complex Vitamins (VITAMIN B COMPLEX PO) Take by mouth.   Yes [provider]  cetirizine (ZYRTEC) 10 MG tablet Take 10 mg by mouth daily.   Yes [provider]  Cholecalciferol (VITAMIN D) 2000 units CAPS Take 1 capsule (2,000 Units total) by mouth daily. 01/07/16  Yes Plonk, Gwyndolyn Saxon, MD  metoprolol tartrate (LOPRESSOR) 25 MG tablet Take 12.5 mg by mouth 2 (two) times daily.   Yes [provider]  nitroGLYCERIN (NITROSTAT) 0.4 MG SL tablet Place 1 tablet (0.4 mg total) under the tongue every 5 (five) minutes x 3 doses as needed for chest pain. 10/02/14  Yes Charolette Forward, MD    No Known Allergies  Past  Surgical History:  Procedure Laterality Date  . ABDOMINAL SURGERY    . APPENDECTOMY    . BLADDER NECK SUSPENSION    . cardiac stents    . HERNIA REPAIR    . LEFT HEART CATHETERIZATION WITH CORONARY ANGIOGRAM N/A 09/29/2014   Procedure: LEFT HEART CATHETERIZATION WITH CORONARY ANGIOGRAM;  Surgeon: Clent Demark, MD;  Location: [facility] CATH LAB;  Service: Cardiovascular;  Laterality: N/A;    Social History   Tobacco Use  . Smoking status: Current Every Day Smoker    Packs/day: 0.50    Types: Cigarettes  . Smokeless tobacco: Never Used  Substance Use Topics  . Alcohol use: Yes    Alcohol/week: 0.0 oz    Comment: occassional  . Drug use: No     Medication list has been reviewed and updated.  PHQ 2/9 Scores 02/08/2018 01/02/2016  PHQ - 2 Score 0 0    Physical Exam  Constitutional: She is oriented to person, place, and time. She appears well-developed. No distress.  HENT:  Head: Normocephalic and atraumatic.  Right Ear: Tympanic membrane normal.  Left Ear: Tympanic membrane normal.  Nose: Right sinus exhibits no maxillary sinus tenderness. Left sinus exhibits no maxillary sinus tenderness.  Mouth/Throat: No posterior oropharyngeal edema or posterior oropharyngeal erythema.  Neck: Normal range of motion. Neck supple.  Cardiovascular: Normal rate, regular rhythm and normal heart sounds.  Pulmonary/Chest: Effort normal and breath sounds normal. No respiratory distress.  Musculoskeletal:       Left knee: She exhibits decreased range of motion. She exhibits no effusion.       Thoracic back: She exhibits tenderness and spasm. She exhibits no bony tenderness.  Neurological: She is alert and oriented to person, place, and time. She has normal strength and normal reflexes. No sensory deficit.  Skin: Skin is warm and dry. No rash noted.  Psychiatric: She has a normal mood and affect. Her behavior is normal. Thought content normal.  Nursing note and vitals reviewed.   BP 117/81   Pulse  74   Resp 16   Ht 5\' 5"  (1.651 m)   Wt 184 lb (83.5 kg)   SpO2 97%   BMI 30.62 kg/m   Assessment and Plan: 1. Cough Likely related to allergies and PND Continue zyrtec, monitor for triggers Consider ENT evaluation if persistent  2. Essential hypertension controlled  3. Primary osteoarthritis of left knee Likely contributing to balance issues Safety measures to prevent falls discussed  4. Mid back pain Likely muscular  5. Ovarian failure - DG Bone Density; Future   No orders of the defined types were placed in this encounter.   Partially dictated using Editor, commissioning. Any errors are unintentional.  Halina Maidens, MD [facility]  05/10/2018

## 2018-05-18 ENCOUNTER — Ambulatory Visit
Admission: RE | Admit: 2018-05-18 | Discharge: 2018-05-18 | Disposition: A | Discharge status: Home / Self Care | Payer: Medicare Other | Source: Ambulatory Visit | Attending: Internal Medicine | Admitting: Internal Medicine

## 2018-05-18 DIAGNOSIS — E2839 Other primary ovarian failure: Secondary | ICD-10-CM | POA: Insufficient documentation

## 2018-05-18 DIAGNOSIS — M8589 Other specified disorders of bone density and structure, multiple sites: Secondary | ICD-10-CM | POA: Diagnosis not present

## 2018-05-18 DIAGNOSIS — Z780 Asymptomatic menopausal state: Secondary | ICD-10-CM | POA: Diagnosis not present

## 2018-05-19 ENCOUNTER — Encounter: Payer: Self-pay | Admitting: Internal Medicine

## 2018-05-19 DIAGNOSIS — M8580 Other specified disorders of bone density and structure, unspecified site: Secondary | ICD-10-CM | POA: Insufficient documentation

## 2018-05-30 ENCOUNTER — Inpatient Hospital Stay
Admission: RE | Admit: 2018-05-30 | Discharge: 2018-05-30 | Disposition: A | Discharge status: Home / Self Care | Payer: Self-pay | Source: Ambulatory Visit | Attending: *Deleted | Admitting: *Deleted

## 2018-05-30 ENCOUNTER — Other Ambulatory Visit: Payer: Self-pay | Admitting: *Deleted

## 2018-05-30 DIAGNOSIS — Z9289 Personal history of other medical treatment: Secondary | ICD-10-CM

## 2018-06-03 ENCOUNTER — Other Ambulatory Visit: Payer: Self-pay | Admitting: Internal Medicine

## 2018-06-03 DIAGNOSIS — Z1231 Encounter for screening mammogram for malignant neoplasm of breast: Secondary | ICD-10-CM

## 2018-06-08 ENCOUNTER — Ambulatory Visit
Admission: RE | Admit: 2018-06-08 | Discharge: 2018-06-08 | Disposition: A | Discharge status: Home / Self Care | Payer: Medicare Other | Source: Ambulatory Visit | Attending: Internal Medicine | Admitting: Internal Medicine

## 2018-06-08 DIAGNOSIS — Z1231 Encounter for screening mammogram for malignant neoplasm of breast: Secondary | ICD-10-CM | POA: Diagnosis not present

## 2018-07-20 ENCOUNTER — Encounter: Payer: Self-pay | Admitting: Internal Medicine

## 2018-07-20 ENCOUNTER — Ambulatory Visit (INDEPENDENT_AMBULATORY_CARE_PROVIDER_SITE_OTHER): Payer: Medicare Other | Admitting: Internal Medicine

## 2018-07-20 ENCOUNTER — Ambulatory Visit
Admission: RE | Admit: 2018-07-20 | Discharge: 2018-07-20 | Disposition: A | Discharge status: Home / Self Care | Payer: Medicare Other | Source: Ambulatory Visit | Attending: Internal Medicine | Admitting: Internal Medicine

## 2018-07-20 ENCOUNTER — Other Ambulatory Visit: Payer: Self-pay | Admitting: Internal Medicine

## 2018-07-20 ENCOUNTER — Telehealth: Payer: Self-pay

## 2018-07-20 VITALS — BP 118/70 | HR 90 | Ht 65.0 in | Wt 177.0 lb

## 2018-07-20 DIAGNOSIS — R05 Cough: Secondary | ICD-10-CM | POA: Insufficient documentation

## 2018-07-20 DIAGNOSIS — R0602 Shortness of breath: Secondary | ICD-10-CM

## 2018-07-20 DIAGNOSIS — I25119 Atherosclerotic heart disease of native coronary artery with unspecified angina pectoris: Secondary | ICD-10-CM | POA: Diagnosis not present

## 2018-07-20 DIAGNOSIS — F17200 Nicotine dependence, unspecified, uncomplicated: Secondary | ICD-10-CM | POA: Diagnosis not present

## 2018-07-20 DIAGNOSIS — R918 Other nonspecific abnormal finding of lung field: Secondary | ICD-10-CM | POA: Diagnosis not present

## 2018-07-20 MED ORDER — AZITHROMYCIN 250 MG PO TABS: ORAL_TABLET | ORAL | 0 refills | Status: AC

## 2018-07-20 NOTE — Patient Instructions (Signed)
Anoro - one inhalation once a day, rinse your mouth afterwards.

## 2018-07-20 NOTE — Progress Notes (Signed)
Date:  07/20/2018   Name:  Kristi [name] [facility]   DOB:  Aug 03, 1946   MRN:  [id number]   Chief Complaint: Shortness of Breath (Having hard time catching breath. Making the bed even causes her to be unable to breathe. Striggling to climb stairs and walk long distances. Was told in the past its possible she has COPD.)  Shortness of Breath  This is a chronic problem. The current episode started 1 to 4 weeks ago. The problem has been rapidly worsening (previously could walk up one flight carrying groceries and only be slightly SOB, now has to stop half was up to rest). Associated symptoms include wheezing. Pertinent negatives include no abdominal pain, chest pain, fever, headaches, leg swelling or rash. The symptoms are aggravated by any activity.  Nicotine Dependence  Presents for follow-up visit. Symptoms are negative for fatigue. Her urge triggers include company of smokers. The symptoms have been improving. She smokes < 1/2 a pack of cigarettes per day. Compliance with prior treatments: working hard to quit on her own - cardiology gave her bupropion but prefers to try without medications.    Review of Systems  Constitutional: Negative for chills, fatigue, fever and unexpected weight change.  Eyes: Negative for visual disturbance.  Respiratory: Positive for cough, chest tightness, shortness of breath and wheezing.   Cardiovascular: Negative for chest pain, palpitations and leg swelling.  Gastrointestinal: Negative for abdominal pain.  Skin: Negative for color change and rash.  Neurological: Negative for dizziness, tremors, weakness and headaches.  Psychiatric/Behavioral: Negative for sleep disturbance.    Patient Active Problem List   Diagnosis Date Noted  . Osteopenia determined by x-ray 05/19/2018  . Prediabetes 02/08/2018  . Hx of fracture of radius 08/18/2016  . Vitamin D deficiency 01/07/2016  . Hyperlipidemia 01/02/2016  . Hypertension 01/02/2016  . Chronic pain 01/02/2016  .  Obesity (BMI 30.0-34.9) 01/02/2016  . CAD (coronary artery disease) 01/02/2016  . IBS (irritable bowel syndrome) 01/02/2016  . FH: colon cancer 01/02/2016  . DDD (degenerative disc disease), cervical 01/02/2016  . Primary osteoarthritis of left knee 01/02/2016  . Smoker 01/02/2016    Prior to Admission medications   Medication Sig Start Date End Date Taking? Authorizing Provider  aspirin 81 MG chewable tablet Chew 1 tablet (81 mg total) by mouth daily. 10/02/14   Charolette Forward, MD  atorvastatin (LIPITOR) 80 MG tablet Take 0.5 tablets (40 mg total) by mouth daily at 6 PM. 01/02/16   Plonk, Gwyndolyn Saxon, MD  B Complex Vitamins (VITAMIN B COMPLEX PO) Take by mouth.    [provider]  cetirizine (ZYRTEC) 10 MG tablet Take 10 mg by mouth daily.    [provider]  Cholecalciferol (VITAMIN D) 2000 units CAPS Take 1 capsule (2,000 Units total) by mouth daily. 01/07/16   Plonk, Gwyndolyn Saxon, MD  metoprolol tartrate (LOPRESSOR) 25 MG tablet Take 12.5 mg by mouth 2 (two) times daily.    [provider]  nitroGLYCERIN (NITROSTAT) 0.4 MG SL tablet Place 1 tablet (0.4 mg total) under the tongue every 5 (five) minutes x 3 doses as needed for chest pain. 10/02/14   Charolette Forward, MD    No Known Allergies  Past Surgical History:  Procedure Laterality Date  . ABDOMINAL SURGERY    . APPENDECTOMY    . BLADDER NECK SUSPENSION    . cardiac stents    . HERNIA REPAIR    . LEFT HEART CATHETERIZATION WITH CORONARY ANGIOGRAM N/A 09/29/2014   Procedure: LEFT HEART CATHETERIZATION  WITH CORONARY ANGIOGRAM;  Surgeon: Clent Demark, MD;  Location: [facility] CATH LAB;  Service: Cardiovascular;  Laterality: N/A;    Social History   Tobacco Use  . Smoking status: Current Every Day Smoker    Packs/day: 0.25    Types: Cigarettes  . Smokeless tobacco: Never Used  . Tobacco comment: 3-4 daily  Substance Use Topics  . Alcohol use: Yes    Alcohol/week: 0.0 oz    Comment: occassional  . Drug use: No      Medication list has been reviewed and updated.  Current Meds  Medication Sig  . aspirin 81 MG chewable tablet Chew 1 tablet (81 mg total) by mouth daily.  [name] Kitchen atorvastatin (LIPITOR) 80 MG tablet Take 0.5 tablets (40 mg total) by mouth daily at 6 PM.  . B Complex Vitamins (VITAMIN B COMPLEX PO) Take by mouth.  . Calcium 250 MG CAPS Take by mouth.  . cetirizine (ZYRTEC) 10 MG tablet Take 10 mg by mouth daily.  . Cholecalciferol (VITAMIN D) 2000 units CAPS Take 1 capsule (2,000 Units total) by mouth daily.  . metoprolol tartrate (LOPRESSOR) 25 MG tablet Take 12.5 mg by mouth 2 (two) times daily.  . nitroGLYCERIN (NITROSTAT) 0.4 MG SL tablet Place 1 tablet (0.4 mg total) under the tongue every 5 (five) minutes x 3 doses as needed for chest pain.    PHQ 2/9 Scores 02/08/2018 01/02/2016  PHQ - 2 Score 0 0    Physical Exam  Constitutional: She is oriented to person, place, and time. She appears well-developed. No distress.  HENT:  Head: Normocephalic and atraumatic.  Neck: Normal range of motion. Neck supple.  Cardiovascular: Normal rate and regular rhythm.  Pulmonary/Chest: Effort normal and breath sounds normal. No respiratory distress. She has no wheezes. She has no rhonchi. She has no rales.  Musculoskeletal: Normal range of motion.  Lymphadenopathy:    She has no cervical adenopathy.  Neurological: She is alert and oriented to person, place, and time.  Skin: Skin is warm and dry. No rash noted.  Psychiatric: She has a normal mood and affect. Her behavior is normal. Thought content normal.  Nursing note and vitals reviewed.   BP 118/70   Pulse 90   Ht 5\' 5"  (1.651 m)   Wt 177 lb (80.3 kg)   SpO2 96%   BMI 29.45 kg/m   Assessment and Plan: 1. Shortness of breath Begin Anoro - one inhalation daily (sample) - DG Chest 2 View; Future - CBC with Differential/Platelet - Comprehensive metabolic panel  2. Smoker Continue to taper and d/c without medication at that  time   No orders of the defined types were placed in this encounter.   Partially dictated using Editor, commissioning. Any errors are unintentional.  Halina Maidens, MD [facility]  07/20/2018

## 2018-07-20 NOTE — Telephone Encounter (Signed)
Done

## 2018-07-20 NOTE — Telephone Encounter (Signed)
Patient needs antibiotics sent in for CXR (lower lung infection)  Please Advise.  Pharmacy: [facility]

## 2018-07-21 LAB — CBC WITH DIFFERENTIAL/PLATELET
BASOS: 1 %
Basophils Absolute: 0.1 10*3/uL (ref 0.0–0.2)
EOS (ABSOLUTE): 0.5 10*3/uL — ABNORMAL HIGH (ref 0.0–0.4)
Eos: 5 %
HEMATOCRIT: 45.9 % (ref 34.0–46.6)
HEMOGLOBIN: 15.2 g/dL (ref 11.1–15.9)
IMMATURE GRANS (ABS): 0.1 10*3/uL (ref 0.0–0.1)
IMMATURE GRANULOCYTES: 1 %
LYMPHS: 29 %
Lymphocytes Absolute: 3.2 10*3/uL — ABNORMAL HIGH (ref 0.7–3.1)
MCH: 30.3 pg (ref 26.6–33.0)
MCHC: 33.1 g/dL (ref 31.5–35.7)
MCV: 91 fL (ref 79–97)
Monocytes Absolute: 0.9 10*3/uL (ref 0.1–0.9)
Monocytes: 9 %
NEUTROS PCT: 55 %
Neutrophils Absolute: 6.3 10*3/uL (ref 1.4–7.0)
Platelets: 265 10*3/uL (ref 150–450)
RBC: 5.02 x10E6/uL (ref 3.77–5.28)
RDW: 14.3 % (ref 12.3–15.4)
WBC: 11 10*3/uL — AB (ref 3.4–10.8)

## 2018-07-21 LAB — COMPREHENSIVE METABOLIC PANEL
ALK PHOS: 112 IU/L (ref 39–117)
ALT: 24 IU/L (ref 0–32)
AST: 26 IU/L (ref 0–40)
Albumin/Globulin Ratio: 1.4 (ref 1.2–2.2)
Albumin: 4.5 g/dL (ref 3.5–4.8)
BUN/Creatinine Ratio: 13 (ref 12–28)
BUN: 10 mg/dL (ref 8–27)
Bilirubin Total: 0.4 mg/dL (ref 0.0–1.2)
CHLORIDE: 97 mmol/L (ref 96–106)
CO2: 22 mmol/L (ref 20–29)
Calcium: 9.8 mg/dL (ref 8.7–10.3)
Creatinine, Ser: 0.8 mg/dL (ref 0.57–1.00)
GFR calc Af Amer: 86 mL/min/{1.73_m2} (ref >59)
GFR calc non Af Amer: 74 mL/min/{1.73_m2} (ref >59)
GLUCOSE: 99 mg/dL (ref 65–99)
Globulin, Total: 3.2 g/dL (ref 1.5–4.5)
Potassium: 3.9 mmol/L (ref 3.5–5.2)
Sodium: 139 mmol/L (ref 134–144)
Total Protein: 7.7 g/dL (ref 6.0–8.5)

## 2018-08-11 ENCOUNTER — Encounter: Payer: Self-pay | Admitting: Internal Medicine

## 2018-08-11 ENCOUNTER — Ambulatory Visit (INDEPENDENT_AMBULATORY_CARE_PROVIDER_SITE_OTHER): Payer: Medicare Other | Admitting: Internal Medicine

## 2018-08-11 DIAGNOSIS — I25119 Atherosclerotic heart disease of native coronary artery with unspecified angina pectoris: Secondary | ICD-10-CM

## 2018-08-11 DIAGNOSIS — J441 Chronic obstructive pulmonary disease with (acute) exacerbation: Secondary | ICD-10-CM | POA: Insufficient documentation

## 2018-08-11 NOTE — Progress Notes (Signed)
Date:  08/11/2018   Name:  Kristi [name] [facility]   DOB:  Aug 30, 1946   MRN:  [id number]   Chief Complaint: Cough (Inhaler worked for two weeks. Then ran out. Breathing is better but cough is worse than before. SOB when moving. )  Cough  This is a chronic problem. The problem has been unchanged. Associated symptoms include postnasal drip and shortness of breath. Pertinent negatives include no chest pain, chills, fever, headaches, heartburn, sore throat, weight loss or wheezing. The symptoms are aggravated by exercise. She has tried ipratropium inhaler for the symptoms. The treatment provided significant (but ran out of sample) relief.     Review of Systems  Constitutional: Negative for chills, diaphoresis, fever and weight loss.  HENT: Positive for postnasal drip. Negative for sore throat and trouble swallowing.   Respiratory: Positive for cough, chest tightness and shortness of breath. Negative for wheezing.   Cardiovascular: Negative for chest pain and palpitations.  Gastrointestinal: Negative for heartburn.  Neurological: Negative for dizziness and headaches.    Patient Active Problem List   Diagnosis Date Noted  . Osteopenia determined by x-ray 05/19/2018  . Prediabetes 02/08/2018  . Hx of fracture of radius 08/18/2016  . Vitamin D deficiency 01/07/2016  . Hyperlipidemia 01/02/2016  . Hypertension 01/02/2016  . Chronic pain 01/02/2016  . Obesity (BMI 30.0-34.9) 01/02/2016  . CAD (coronary artery disease) 01/02/2016  . IBS (irritable bowel syndrome) 01/02/2016  . FH: colon cancer 01/02/2016  . DDD (degenerative disc disease), cervical 01/02/2016  . Primary osteoarthritis of left knee 01/02/2016  . Smoker 01/02/2016    No Known Allergies  Past Surgical History:  Procedure Laterality Date  . ABDOMINAL SURGERY    . APPENDECTOMY    . BLADDER NECK SUSPENSION    . cardiac stents    . HERNIA REPAIR    . LEFT HEART CATHETERIZATION WITH CORONARY ANGIOGRAM N/A 09/29/2014   Procedure: LEFT HEART CATHETERIZATION WITH CORONARY ANGIOGRAM;  Surgeon: Clent Demark, MD;  Location: [facility] CATH LAB;  Service: Cardiovascular;  Laterality: N/A;    Social History   Tobacco Use  . Smoking status: Current Every Day Smoker    Packs/day: 0.50    Years: 39.00    Pack years: 19.50    Types: Cigarettes    Start date: 12/21/1978  . Smokeless tobacco: Never Used  . Tobacco comment: 3-4 daily  Substance Use Topics  . Alcohol use: Yes    Alcohol/week: 0.0 standard drinks    Comment: occassional  . Drug use: No     Medication list has been reviewed and updated.  Current Meds  Medication Sig  . aspirin 81 MG chewable tablet Chew 1 tablet (81 mg total) by mouth daily.  [name] Kitchen atorvastatin (LIPITOR) 80 MG tablet Take 0.5 tablets (40 mg total) by mouth daily at 6 PM.  . B Complex Vitamins (VITAMIN B COMPLEX PO) Take by mouth.  . Calcium 250 MG CAPS Take by mouth.  . cetirizine (ZYRTEC) 10 MG tablet Take 10 mg by mouth daily.  . Cholecalciferol (VITAMIN D) 2000 units CAPS Take 1 capsule (2,000 Units total) by mouth daily.  . metoprolol tartrate (LOPRESSOR) 25 MG tablet Take 12.5 mg by mouth 2 (two) times daily.  . nitroGLYCERIN (NITROSTAT) 0.4 MG SL tablet Place 1 tablet (0.4 mg total) under the tongue every 5 (five) minutes x 3 doses as needed for chest pain.    PHQ 2/9 Scores 02/08/2018 01/02/2016  PHQ - 2 Score 0 0  Physical Exam  Constitutional: She is oriented to person, place, and time. She appears well-developed. No distress.  HENT:  Head: Normocephalic and atraumatic.  Cardiovascular: Normal rate and regular rhythm.  Pulmonary/Chest: Effort normal. No respiratory distress. She has decreased breath sounds. She has no wheezes. She has no rhonchi.  Musculoskeletal: Normal range of motion.  Neurological: She is alert and oriented to person, place, and time.  Skin: Skin is warm and dry. No rash noted.  Psychiatric: She has a normal mood and affect. Her behavior is  normal. Thought content normal.  Nursing note and vitals reviewed.   BP 124/68 (BP Location: Right Arm, Patient Position: Sitting, Cuff Size: Normal)   Pulse 67   Temp 97.9 F (36.6 C) (Oral)   Ht 5\' 5"  (1.651 m)   Wt 177 lb (80.3 kg)   SpO2 97%   BMI 29.45 kg/m   Assessment and Plan: 1. COPD with exacerbation ([place]) Samples of Treligy given - Ambulatory referral to Pulmonology   No orders of the defined types were placed in this encounter.   Partially dictated using Editor, commissioning. Any errors are unintentional.  Halina Maidens, MD [facility]  08/11/2018

## 2018-08-11 NOTE — Patient Instructions (Addendum)
Mucinex 600 mg twice a day (get the plain, generic version)  Delsym cough syrup -

## 2018-08-12 ENCOUNTER — Emergency Department: Payer: Medicare Other

## 2018-08-12 ENCOUNTER — Encounter: Payer: Self-pay | Admitting: Emergency Medicine

## 2018-08-12 ENCOUNTER — Emergency Department
Admission: EM | Admit: 2018-08-12 | Discharge: 2018-08-12 | Disposition: A | Discharge status: Home / Self Care | Payer: Medicare Other | Source: Home / Self Care | Attending: Emergency Medicine | Admitting: Emergency Medicine

## 2018-08-12 ENCOUNTER — Other Ambulatory Visit: Payer: Self-pay

## 2018-08-12 DIAGNOSIS — F17210 Nicotine dependence, cigarettes, uncomplicated: Secondary | ICD-10-CM

## 2018-08-12 DIAGNOSIS — K82A1 Gangrene of gallbladder in cholecystitis: Secondary | ICD-10-CM | POA: Diagnosis not present

## 2018-08-12 DIAGNOSIS — E119 Type 2 diabetes mellitus without complications: Secondary | ICD-10-CM

## 2018-08-12 DIAGNOSIS — I10 Essential (primary) hypertension: Secondary | ICD-10-CM | POA: Insufficient documentation

## 2018-08-12 DIAGNOSIS — Z79899 Other long term (current) drug therapy: Secondary | ICD-10-CM

## 2018-08-12 DIAGNOSIS — N3000 Acute cystitis without hematuria: Secondary | ICD-10-CM | POA: Diagnosis not present

## 2018-08-12 DIAGNOSIS — J9811 Atelectasis: Secondary | ICD-10-CM | POA: Diagnosis not present

## 2018-08-12 DIAGNOSIS — J449 Chronic obstructive pulmonary disease, unspecified: Secondary | ICD-10-CM | POA: Diagnosis not present

## 2018-08-12 DIAGNOSIS — N3090 Cystitis, unspecified without hematuria: Secondary | ICD-10-CM

## 2018-08-12 DIAGNOSIS — Z7982 Long term (current) use of aspirin: Secondary | ICD-10-CM

## 2018-08-12 DIAGNOSIS — I252 Old myocardial infarction: Secondary | ICD-10-CM | POA: Diagnosis not present

## 2018-08-12 DIAGNOSIS — R1011 Right upper quadrant pain: Secondary | ICD-10-CM | POA: Diagnosis not present

## 2018-08-12 DIAGNOSIS — K449 Diaphragmatic hernia without obstruction or gangrene: Secondary | ICD-10-CM | POA: Diagnosis not present

## 2018-08-12 DIAGNOSIS — E871 Hypo-osmolality and hyponatremia: Secondary | ICD-10-CM

## 2018-08-12 DIAGNOSIS — I2510 Atherosclerotic heart disease of native coronary artery without angina pectoris: Secondary | ICD-10-CM | POA: Insufficient documentation

## 2018-08-12 DIAGNOSIS — J441 Chronic obstructive pulmonary disease with (acute) exacerbation: Secondary | ICD-10-CM

## 2018-08-12 DIAGNOSIS — K810 Acute cholecystitis: Secondary | ICD-10-CM | POA: Diagnosis not present

## 2018-08-12 DIAGNOSIS — K8020 Calculus of gallbladder without cholecystitis without obstruction: Secondary | ICD-10-CM

## 2018-08-12 DIAGNOSIS — R079 Chest pain, unspecified: Secondary | ICD-10-CM

## 2018-08-12 DIAGNOSIS — R001 Bradycardia, unspecified: Secondary | ICD-10-CM | POA: Diagnosis not present

## 2018-08-12 DIAGNOSIS — K819 Cholecystitis, unspecified: Secondary | ICD-10-CM | POA: Diagnosis not present

## 2018-08-12 HISTORY — DX: Atherosclerotic heart disease of native coronary artery without angina pectoris: I25.10

## 2018-08-12 HISTORY — DX: Essential (primary) hypertension: I10

## 2018-08-12 LAB — BASIC METABOLIC PANEL
Anion gap: 9 (ref 5–15)
BUN: 9 mg/dL (ref 8–23)
CHLORIDE: 97 mmol/L — AB (ref 98–111)
CO2: 25 mmol/L (ref 22–32)
Calcium: 9.1 mg/dL (ref 8.9–10.3)
Creatinine, Ser: 0.74 mg/dL (ref 0.44–1.00)
GFR calc Af Amer: >60 mL/min (ref >60)
GFR calc non Af Amer: >60 mL/min (ref >60)
GLUCOSE: 122 mg/dL — AB (ref 70–99)
POTASSIUM: 3.7 mmol/L (ref 3.5–5.1)
Sodium: 131 mmol/L — ABNORMAL LOW (ref 135–145)

## 2018-08-12 LAB — TROPONIN I
Troponin I: <0.03 ng/mL (ref <0.03)
Troponin I: <0.03 ng/mL (ref <0.03)

## 2018-08-12 LAB — URINALYSIS, COMPLETE (UACMP) WITH MICROSCOPIC
Bilirubin Urine: NEGATIVE
Glucose, UA: NEGATIVE mg/dL
Ketones, ur: 20 mg/dL — AB
Nitrite: NEGATIVE
PROTEIN: NEGATIVE mg/dL
Specific Gravity, Urine: 1.043 — ABNORMAL HIGH (ref 1.005–1.030)
pH: 6 (ref 5.0–8.0)

## 2018-08-12 LAB — CBC
HEMATOCRIT: 39.6 % (ref 35.0–47.0)
Hemoglobin: 14 g/dL (ref 12.0–16.0)
MCH: 31.2 pg (ref 26.0–34.0)
MCHC: 35.4 g/dL (ref 32.0–36.0)
MCV: 88.1 fL (ref 80.0–100.0)
Platelets: 259 10*3/uL (ref 150–440)
RBC: 4.5 MIL/uL (ref 3.80–5.20)
RDW: 14.3 % (ref 11.5–14.5)
WBC: 9.9 10*3/uL (ref 3.6–11.0)

## 2018-08-12 LAB — HEPATIC FUNCTION PANEL
ALK PHOS: 81 U/L (ref 38–126)
ALT: 26 U/L (ref 0–44)
AST: 33 U/L (ref 15–41)
Albumin: 4.3 g/dL (ref 3.5–5.0)
BILIRUBIN DIRECT: 0.2 mg/dL (ref 0.0–0.2)
BILIRUBIN TOTAL: 0.6 mg/dL (ref 0.3–1.2)
Indirect Bilirubin: 0.4 mg/dL (ref 0.3–0.9)
Total Protein: 8 g/dL (ref 6.5–8.1)

## 2018-08-12 LAB — LIPASE, BLOOD: Lipase: 33 U/L (ref 11–51)

## 2018-08-12 LAB — BRAIN NATRIURETIC PEPTIDE: B Natriuretic Peptide: 64 pg/mL (ref 0.0–100.0)

## 2018-08-12 MED ORDER — CEPHALEXIN 500 MG PO CAPS: 500.0000 mg | ORAL_CAPSULE | Freq: Four times a day (QID) | ORAL | 0 refills | Status: DC

## 2018-08-12 MED ORDER — ONDANSETRON HCL 4 MG/2ML IJ SOLN
4.0000 mg | Freq: Once | INTRAMUSCULAR | Status: AC
  Administered 2018-08-12: 4 mg via INTRAVENOUS

## 2018-08-12 MED ORDER — ONDANSETRON 4 MG PO TBDP: 4.0000 mg | ORAL_TABLET | Freq: Three times a day (TID) | ORAL | 0 refills | Status: DC | PRN

## 2018-08-12 MED ORDER — ACETAMINOPHEN 500 MG PO TABS
1000.0000 mg | ORAL_TABLET | Freq: Once | ORAL | Status: AC
  Administered 2018-08-12: 1000 mg via ORAL
  Filled 2018-08-12: qty 2

## 2018-08-12 MED ORDER — IOPAMIDOL (ISOVUE-300) INJECTION 61%
100.0000 mL | Freq: Once | INTRAVENOUS | Status: AC | PRN
  Administered 2018-08-12: 100 mL via INTRAVENOUS

## 2018-08-12 MED ORDER — CEPHALEXIN 500 MG PO CAPS
500.0000 mg | ORAL_CAPSULE | Freq: Once | ORAL | Status: AC
  Administered 2018-08-12: 500 mg via ORAL
  Filled 2018-08-12: qty 1

## 2018-08-12 MED ORDER — SODIUM CHLORIDE 0.9 % IV BOLUS
1000.0000 mL | Freq: Once | INTRAVENOUS | Status: AC
  Administered 2018-08-12: 1000 mL via INTRAVENOUS

## 2018-08-12 MED ORDER — ONDANSETRON HCL 4 MG/2ML IJ SOLN
4.0000 mg | Freq: Once | INTRAMUSCULAR | Status: AC
  Administered 2018-08-12: 4 mg via INTRAVENOUS
  Filled 2018-08-12: qty 2

## 2018-08-12 MED ORDER — ONDANSETRON HCL 4 MG/2ML IJ SOLN
INTRAMUSCULAR | Status: AC
  Administered 2018-08-12: 4 mg via INTRAVENOUS
  Filled 2018-08-12: qty 2

## 2018-08-12 MED ORDER — MORPHINE SULFATE (PF) 4 MG/ML IV SOLN
4.0000 mg | Freq: Once | INTRAVENOUS | Status: AC
  Administered 2018-08-12: 4 mg via INTRAVENOUS
  Filled 2018-08-12: qty 1

## 2018-08-12 MED ORDER — OXYCODONE-ACETAMINOPHEN 5-325 MG PO TABS: 1.0000 | ORAL_TABLET | ORAL | 0 refills | Status: DC | PRN

## 2018-08-12 NOTE — ED Notes (Signed)
Patient transported to CT 

## 2018-08-12 NOTE — ED Notes (Signed)
Pt is going to ultrasound.

## 2018-08-12 NOTE — Discharge Instructions (Addendum)
Please take a clear liquid diet for the next 24 hours, then advance to a bland diet as tolerated.  Please avoid fatty or spicy foods, as this can worsen your gallbladder pain.  Please appointment with the general surgeon to be evaluated for possible gallbladder removal surgery.  Return to the emergency department if you develop severe pain, lightheadedness or fainting, fever, inability to keep down fluids, or any other symptoms concerning to you.

## 2018-08-12 NOTE — ED Provider Notes (Signed)
[facility] Emergency Department Provider Note  ____________________________________________  Time seen: Approximately 6:48 PM  I have reviewed the triage vital signs and the nursing notes.   HISTORY  Chief Complaint Chest Pain    HPI Kristi [name] is a 72 y.o. female with a prior history of hernia repair, CAD status post MI 2015, DM, presenting with nausea and vomiting, abdominal pain and chest pain.  The patient reports that earlier today she had a diffuse severe crampy sensation in the abdomen which moved up into her chest, and felt similar to her prior MI.  She had associated diaphoresis with nausea and began vomiting upon arrival to the emergency department.  Her last bowel movement was this morning and it was normal. she denies any shortness of breath, palpitations, lightheadedness or syncope, fever or chills, urinary symptoms.   Past Medical History:  Diagnosis Date  . Acute MI, inferoposterior wall ([place]) 09/30/2014  . Coronary artery disease   . Diabetes insipidus ([place])   . Hypercholesteremia   . Hypertension   . MI, old     Patient Active Problem List   Diagnosis Date Noted  . COPD with exacerbation ([place]) 08/11/2018  . Osteopenia determined by x-ray 05/19/2018  . Prediabetes 02/08/2018  . Hx of fracture of radius 08/18/2016  . Vitamin D deficiency 01/07/2016  . Hyperlipidemia 01/02/2016  . Hypertension 01/02/2016  . Chronic pain 01/02/2016  . Obesity (BMI 30.0-34.9) 01/02/2016  . CAD (coronary artery disease) 01/02/2016  . IBS (irritable bowel syndrome) 01/02/2016  . FH: colon cancer 01/02/2016  . DDD (degenerative disc disease), cervical 01/02/2016  . Primary osteoarthritis of left knee 01/02/2016  . Smoker 01/02/2016    Past Surgical History:  Procedure Laterality Date  . ABDOMINAL SURGERY    . APPENDECTOMY    . BLADDER NECK SUSPENSION    . cardiac stents    . HERNIA REPAIR    . LEFT HEART CATHETERIZATION WITH CORONARY  ANGIOGRAM N/A 09/29/2014   Procedure: LEFT HEART CATHETERIZATION WITH CORONARY ANGIOGRAM;  Surgeon: Clent Demark, MD;  Location[facility];  Service: Cardiovascular;  Laterality: N/A;    Current Outpatient Rx  . Order #: [id number] Class: Print  . Order #: [id number] Class: No Print  . Order #: [id number] Class: Historical Med  . Order #: [id number] Class: Historical Med  . Order #: [id number] Class: Normal  . Order #: [id number] Class: Historical Med  . Order #: [id number] Class: OTC  . Order #: [id number] Class: Historical Med  . Order #: [id number] Class: Print  . Order #: [id number] Class: Normal  . Order #: [id number] Class: Normal    Allergies Patient has no known allergies.  Family History  Problem Relation Age of Onset  . Cancer Mother   . Cancer Father   . Breast cancer Neg Hx     Social History Social History   Tobacco Use  . Smoking status: Current Every Day Smoker    Packs/day: 0.50    Years: 39.00    Pack years: 19.50    Types: Cigarettes    Start date: 12/21/1978  . Smokeless tobacco: Never Used  . Tobacco comment: 3-4 daily  Substance Use Topics  . Alcohol use: Yes    Alcohol/week: 0.0 standard drinks    Comment: occassional  . Drug use: No    Review of Systems Constitutional: No fever/chills.  No lightheadedness or syncope. Eyes: No visual changes. ENT: No sore throat. No congestion or rhinorrhea. Cardiovascular: Positive central chest pain. Denies palpitations. Respiratory: Denies shortness of breath.  No cough. Gastrointestinal: Positive diffuse crampy abdominal pain.  Positive nausea, positive vomiting.  No diarrhea.  No constipation. Genitourinary: Negative for dysuria.  No urinary frequency. Musculoskeletal: Negative for back pain. Skin: Negative for rash. Neurological: Negative for headaches. No focal numbness, tingling or weakness.     ____________________________________________   PHYSICAL EXAM:  VITAL SIGNS: ED Triage Vitals  Enc Vitals Group      BP 08/12/18 1434 107/66     Pulse Rate 08/12/18 1434 66     Resp 08/12/18 1721 15     Temp 08/12/18 1434 (!) 97.5 F (36.4 C)     Temp Source 08/12/18 1434 Oral     SpO2 08/12/18 1434 98 %     Weight 08/12/18 1434 177 lb (80.3 kg)     Height 08/12/18 1434 5\' 5"  (1.651 m)     Head Circumference --      Peak Flow --      Pain Score 08/12/18 1434 10     Pain Loc --      Pain Edu? --      Excl. in [employer]? --     Constitutional: Alert and oriented. Answers questions appropriately.  The patient is uncomfortable appearing and actively vomiting a green liquid on my examination. Eyes: Conjunctivae are normal.  EOMI. No scleral icterus. Head: Atraumatic. Nose: No congestion/rhinnorhea. Mouth/Throat: Mucous membranes are moist.  Neck: No stridor.  Supple.  Mild JVD. Cardiovascular: Normal rate, regular rhythm. No murmurs, rubs or gallops.  Respiratory: Normal respiratory effort.  No accessory muscle use or retractions. Lungs CTAB.  No wheezes, rales or ronchi. Gastrointestinal: Soft, and nondistended.  Tender to palpation in the right upper quadrant in the epigastrium.  No guarding or rebound.  No peritoneal signs. Musculoskeletal: No LE edema. No ttp in the calves or palpable cords.  Negative Homan's sign. Neurologic:  A&Ox3.  Speech is clear.  Face and smile are symmetric.  EOMI.  Moves all extremities well. Skin:  Skin is warm, dry and intact. No rash noted. Psychiatric: Mood and affect are normal. Speech and behavior are normal.  Normal judgement.  ____________________________________________   LABS (all labs ordered are listed, but only abnormal results are displayed)  Labs Reviewed  BASIC METABOLIC PANEL - Abnormal; Notable for the following components:      Result Value   Sodium 131 (*)    Chloride 97 (*)    Glucose, Bld 122 (*)    All other components within normal limits  URINALYSIS, COMPLETE (UACMP) WITH MICROSCOPIC - Abnormal; Notable for the following components:    Color, Urine YELLOW (*)    APPearance HAZY (*)    Specific Gravity, Urine 1.043 (*)    Hgb urine dipstick SMALL (*)    Ketones, ur 20 (*)    Leukocytes, UA MODERATE (*)    Bacteria, UA MANY (*)    All other components within normal limits  URINE CULTURE  CBC  TROPONIN I  BRAIN NATRIURETIC PEPTIDE  TROPONIN I  HEPATIC FUNCTION PANEL  LIPASE, BLOOD   ____________________________________________  EKG  ED ECG REPORT I, Anne-Caroline Mariea Clonts, the attending physician, personally viewed and interpreted this ECG.   Date: 08/12/2018  EKG Time: 1435  Rate: 58  Rhythm: sinus bradycardia; complete right bundle branch block.  Axis: normal  Intervals:none  ST&T Change: No STEMI; nonspecific T wave inversions in V1 and V2.  EKG is compared to 12/17/2015, where she does have the same T wave inversions; the RSR prime is new.  ____________________________________________  RADIOLOGY  Dg Chest 2 View  Result Date: 08/12/2018 CLINICAL DATA:  Chest pain. EXAM: CHEST - 2 VIEW COMPARISON:  Radiographs of July 20, 2018. FINDINGS: The heart size and mediastinal contours are within normal limits. No pneumothorax is noted. Stable minimal bibasilar subsegmental atelectasis is noted with small pleural effusions. The visualized skeletal structures are unremarkable. IMPRESSION: Stable minimal bibasilar subsegmental atelectasis with minimal pleural effusions. Electronically Signed   By: Marijo Conception, M.D.   On: 08/12/2018 15:24   Ct Abdomen Pelvis W Contrast  Result Date: 08/12/2018 CLINICAL DATA:  Chest pain generalized abdomen pain EXAM: CT ABDOMEN AND PELVIS WITH CONTRAST TECHNIQUE: Multidetector CT imaging of the abdomen and pelvis was performed using the standard protocol following bolus administration of intravenous contrast. CONTRAST:  144mL ISOVUE-300 IOPAMIDOL (ISOVUE-300) INJECTION 61% COMPARISON:  Chest x-ray 08/12/2018 FINDINGS: Lower chest: Calcified granuloma in the right middle lobe. No  acute consolidation or pleural effusion. Heart size upper normal. Coronary vascular calcification. Small hiatal hernia. Hepatobiliary: Dilated gallbladder without calcified stone. No focal hepatic abnormality or biliary dilatation Pancreas: Unremarkable. No pancreatic ductal dilatation or surrounding inflammatory changes. Spleen: Normal in size without focal abnormality. Adrenals/Urinary Tract: Adrenal glands are unremarkable. Kidneys are normal, without renal calculi, focal lesion, or hydronephrosis. Bladder is unremarkable. Stomach/Bowel: Stomach nonenlarged. No dilated small bowel. Colon diverticular disease without acute inflammatory change. Vascular/Lymphatic: Moderate severe aortic atherosclerosis. No aneurysm. No significantly enlarged lymph nodes. Reproductive: Uterus and bilateral adnexa are unremarkable. Other: Negative for free air or free fluid. Musculoskeletal: No fracture. Soft tissue and fat density deep to the pubic symphysis without adjacent bony destructive changes, likely benign. IMPRESSION: 1. Dilated gallbladder without calcified stones or other inflammatory change. If gallbladder disease is suspected, could correlate with sonography. 2. No definite CT evidence for acute intra-abdominal or pelvic abnormality. 3. Diffuse diverticular disease of the colon without acute inflammatory process. Electronically Signed   By: Donavan Foil M.D.   On: 08/12/2018 19:27   US Abdomen Limited Ruq  Result Date: 08/12/2018 CLINICAL DATA:  Right upper quadrant pain EXAM: ULTRASOUND ABDOMEN LIMITED RIGHT UPPER QUADRANT COMPARISON:  CT 08/12/2018 FINDINGS: Gallbladder: Sludge in multiple stones within the gallbladder. Slight increased wall thickness up to 4.3 mm. Negative sonographic Murphy. Common bile duct: Diameter: 4 mm Liver: No focal lesion identified. Within normal limits in parenchymal echogenicity. Portal vein is patent on color Doppler imaging with normal direction of blood flow towards the liver.  IMPRESSION: 1. Cholelithiasis with mild wall thickening but negative sonographic Murphy. Findings are equivocal for cholecystitis. Consider correlation with HIDA scan if suspect acute cholecystitis. 2. No biliary dilatation. Electronically Signed   By: Donavan Foil M.D.   On: 08/12/2018 21:46    ____________________________________________   PROCEDURES  Procedure(s) performed: None  Procedures  Critical Care performed: No ____________________________________________   INITIAL IMPRESSION / ASSESSMENT AND PLAN / ED COURSE  Pertinent labs & imaging results that were available during my care of the patient were reviewed by me and considered in my medical decision making (see chart for details).  72 y.o. female with a history of CAD status post MI and abdominal surgery in the past presenting for abdominal pain, chest pain, nausea and vomiting.  Overall, the patient is hemodynamically stable and afebrile.  However, I am concerned that her pathology today is in the abdomen.  Gallbladder disease is possible, obstruction is also on the differential.  The patient's EKG does not show any ischemic changes, and her troponin is negative  x 2.  A CT scan has been ordered and symptomatic treatment will be administered.  Plan reevaluation for final disposition.  ----------------------------------------- 8:02 PM on 08/12/2018 -----------------------------------------  The patient CT scan does show a dilated gallbladder; an ultrasound has been ordered for further evaluation.  At this time, the patient's pain has resolved but she continues to be nauseated and have gagging.  While we will treat her with some more antiemetic and reevaluate her.  Her BNP is within normal limits.  Her sodium is 131 today, which I attribute to her vomiting.  ----------------------------------------- 10:36 PM on 08/12/2018 -----------------------------------------  The patient is feeling significantly better at this time.   She has no pain, and she has been able to drink liquid without difficulty.  Her ultrasound is consistent with early lithiasis with some mild wall thickening but her clinical picture is not consistent with cholecystitis.  She is not febrile, her white blood cell count is reassuring, and her LFTs are within normal limits.  Clinically, she is able to tolerate by mouth, and her pain is well controlled.  I have talked to the patient about her results, we will plan to have her follow-up with the general surgeon on-call as an outpatient.  We had a long discussion about follow-up instructions as well as return precautions.  In addition, the patient does have a UTI and will go home with a prescription for this.  ____________________________________________  FINAL CLINICAL IMPRESSION(S) / ED DIAGNOSES  Final diagnoses:  Right upper quadrant pain  Hyponatremia  Calculus of gallbladder without cholecystitis without obstruction  Acute cystitis without hematuria         NEW MEDICATIONS STARTED DURING THIS VISIT:  New Prescriptions   CEPHALEXIN (KEFLEX) 500 MG CAPSULE    Take 1 capsule (500 mg total) by mouth 4 (four) times daily for 5 days.   ONDANSETRON (ZOFRAN ODT) 4 MG DISINTEGRATING TABLET    Take 1 tablet (4 mg total) by mouth every 8 (eight) hours as needed for nausea or vomiting.   OXYCODONE-ACETAMINOPHEN (PERCOCET) 5-325 MG TABLET    Take 1 tablet by mouth every 4 (four) hours as needed for severe pain.      Eula Listen, MD 08/12/18 2239

## 2018-08-12 NOTE — ED Notes (Signed)
Pt reports lower center chest pain that 10/10 pressure radiating into the abdomen, pt reports this pain felt like the pain she had in 2015 as when prior MI, pt took 3 nitro when this occurred at around noon, pt takes dailey 81 mg ASA  Hx of GERD, HTN and hyperlipemia

## 2018-08-12 NOTE — ED Triage Notes (Addendum)
C/O chest pain since 1230.  C/O pain to middle of chest and stomach pain.  Has Taken three nitroglycerin pills, but symptoms did not improve.  Recent lung infection.

## 2018-08-12 NOTE — ED Notes (Signed)
Back from ultrasound

## 2018-08-13 ENCOUNTER — Encounter: Payer: Self-pay | Admitting: Emergency Medicine

## 2018-08-13 ENCOUNTER — Inpatient Hospital Stay
Admission: EM | Admit: 2018-08-13 | Discharge: 2018-08-15 | DRG: 419 | Disposition: A | Discharge status: Home / Self Care | Payer: Medicare Other | Attending: Surgery | Admitting: Surgery

## 2018-08-13 ENCOUNTER — Other Ambulatory Visit: Payer: Self-pay

## 2018-08-13 DIAGNOSIS — Z800 Family history of malignant neoplasm of digestive organs: Secondary | ICD-10-CM

## 2018-08-13 DIAGNOSIS — K589 Irritable bowel syndrome without diarrhea: Secondary | ICD-10-CM | POA: Diagnosis present

## 2018-08-13 DIAGNOSIS — K819 Cholecystitis, unspecified: Secondary | ICD-10-CM | POA: Diagnosis not present

## 2018-08-13 DIAGNOSIS — E785 Hyperlipidemia, unspecified: Secondary | ICD-10-CM | POA: Diagnosis present

## 2018-08-13 DIAGNOSIS — Z955 Presence of coronary angioplasty implant and graft: Secondary | ICD-10-CM

## 2018-08-13 DIAGNOSIS — I10 Essential (primary) hypertension: Secondary | ICD-10-CM | POA: Diagnosis present

## 2018-08-13 DIAGNOSIS — K810 Acute cholecystitis: Principal | ICD-10-CM | POA: Diagnosis present

## 2018-08-13 DIAGNOSIS — I2510 Atherosclerotic heart disease of native coronary artery without angina pectoris: Secondary | ICD-10-CM | POA: Diagnosis present

## 2018-08-13 DIAGNOSIS — Z7982 Long term (current) use of aspirin: Secondary | ICD-10-CM

## 2018-08-13 DIAGNOSIS — J449 Chronic obstructive pulmonary disease, unspecified: Secondary | ICD-10-CM | POA: Diagnosis present

## 2018-08-13 DIAGNOSIS — E669 Obesity, unspecified: Secondary | ICD-10-CM | POA: Diagnosis present

## 2018-08-13 DIAGNOSIS — Z79899 Other long term (current) drug therapy: Secondary | ICD-10-CM

## 2018-08-13 DIAGNOSIS — G8929 Other chronic pain: Secondary | ICD-10-CM | POA: Diagnosis present

## 2018-08-13 DIAGNOSIS — Z6834 Body mass index (BMI) 34.0-34.9, adult: Secondary | ICD-10-CM

## 2018-08-13 DIAGNOSIS — I252 Old myocardial infarction: Secondary | ICD-10-CM

## 2018-08-13 DIAGNOSIS — F17210 Nicotine dependence, cigarettes, uncomplicated: Secondary | ICD-10-CM | POA: Diagnosis present

## 2018-08-13 DIAGNOSIS — K82A1 Gangrene of gallbladder in cholecystitis: Secondary | ICD-10-CM | POA: Diagnosis present

## 2018-08-13 LAB — COMPREHENSIVE METABOLIC PANEL
ALT: 23 U/L (ref 0–44)
ANION GAP: 9 (ref 5–15)
AST: 27 U/L (ref 15–41)
Albumin: 4.2 g/dL (ref 3.5–5.0)
Alkaline Phosphatase: 81 U/L (ref 38–126)
BUN: 7 mg/dL — ABNORMAL LOW (ref 8–23)
CO2: 22 mmol/L (ref 22–32)
Calcium: 8.9 mg/dL (ref 8.9–10.3)
Chloride: 98 mmol/L (ref 98–111)
Creatinine, Ser: 0.54 mg/dL (ref 0.44–1.00)
GFR calc non Af Amer: >60 mL/min (ref >60)
Glucose, Bld: 135 mg/dL — ABNORMAL HIGH (ref 70–99)
Potassium: 3.4 mmol/L — ABNORMAL LOW (ref 3.5–5.1)
SODIUM: 129 mmol/L — AB (ref 135–145)
Total Bilirubin: 0.7 mg/dL (ref 0.3–1.2)
Total Protein: 7.6 g/dL (ref 6.5–8.1)

## 2018-08-13 LAB — CBC
HEMATOCRIT: 40.5 % (ref 35.0–47.0)
HEMOGLOBIN: 14.2 g/dL (ref 12.0–16.0)
MCH: 31.1 pg (ref 26.0–34.0)
MCHC: 35.1 g/dL (ref 32.0–36.0)
MCV: 88.7 fL (ref 80.0–100.0)
Platelets: 221 10*3/uL (ref 150–440)
RBC: 4.56 MIL/uL (ref 3.80–5.20)
RDW: 14.1 % (ref 11.5–14.5)
WBC: 12 10*3/uL — AB (ref 3.6–11.0)

## 2018-08-13 LAB — TROPONIN I: Troponin I: <0.03 ng/mL (ref <0.03)

## 2018-08-13 LAB — LIPASE, BLOOD: LIPASE: 28 U/L (ref 11–51)

## 2018-08-13 MED ORDER — ACETAMINOPHEN 650 MG RE SUPP: 650.0000 mg | Freq: Four times a day (QID) | RECTAL | Status: DC | PRN

## 2018-08-13 MED ORDER — PROCHLORPERAZINE MALEATE 10 MG PO TABS
10.0000 mg | ORAL_TABLET | Freq: Four times a day (QID) | ORAL | Status: DC | PRN
  Filled 2018-08-13: qty 1

## 2018-08-13 MED ORDER — MORPHINE SULFATE (PF) 4 MG/ML IV SOLN
4.0000 mg | INTRAVENOUS | Status: DC | PRN
  Administered 2018-08-13 – 2018-08-15 (×5): 4 mg via INTRAVENOUS
  Filled 2018-08-13 (×5): qty 1

## 2018-08-13 MED ORDER — HEPARIN SODIUM (PORCINE) 5000 UNIT/ML IJ SOLN
5000.0000 [IU] | Freq: Three times a day (TID) | INTRAMUSCULAR | Status: DC
  Administered 2018-08-13 – 2018-08-14 (×3): 5000 [IU] via SUBCUTANEOUS
  Filled 2018-08-13 (×3): qty 1

## 2018-08-13 MED ORDER — KETOROLAC TROMETHAMINE 30 MG/ML IJ SOLN
30.0000 mg | Freq: Four times a day (QID) | INTRAMUSCULAR | Status: DC | PRN
  Administered 2018-08-14 – 2018-08-15 (×2): 30 mg via INTRAVENOUS
  Filled 2018-08-13 (×2): qty 1

## 2018-08-13 MED ORDER — SODIUM CHLORIDE 0.9 % IV BOLUS
1000.0000 mL | Freq: Once | INTRAVENOUS | Status: AC
  Administered 2018-08-13: 1000 mL via INTRAVENOUS

## 2018-08-13 MED ORDER — METOPROLOL TARTRATE 25 MG PO TABS
12.5000 mg | ORAL_TABLET | Freq: Two times a day (BID) | ORAL | Status: DC
  Administered 2018-08-13 – 2018-08-15 (×4): 12.5 mg via ORAL
  Filled 2018-08-13 (×4): qty 1

## 2018-08-13 MED ORDER — ONDANSETRON HCL 4 MG/2ML IJ SOLN
4.0000 mg | Freq: Once | INTRAMUSCULAR | Status: AC
  Administered 2018-08-13: 4 mg via INTRAVENOUS
  Filled 2018-08-13: qty 2

## 2018-08-13 MED ORDER — NITROGLYCERIN 0.4 MG SL SUBL: 0.4000 mg | SUBLINGUAL_TABLET | SUBLINGUAL | Status: DC | PRN

## 2018-08-13 MED ORDER — POTASSIUM CHLORIDE CRYS ER 20 MEQ PO TBCR
40.0000 meq | EXTENDED_RELEASE_TABLET | Freq: Two times a day (BID) | ORAL | Status: DC
  Administered 2018-08-13 – 2018-08-14 (×3): 40 meq via ORAL
  Filled 2018-08-13 (×3): qty 2

## 2018-08-13 MED ORDER — ONDANSETRON 4 MG PO TBDP: 4.0000 mg | ORAL_TABLET | Freq: Four times a day (QID) | ORAL | Status: DC | PRN

## 2018-08-13 MED ORDER — ONDANSETRON HCL 4 MG/2ML IJ SOLN
4.0000 mg | Freq: Four times a day (QID) | INTRAMUSCULAR | Status: DC | PRN
  Administered 2018-08-13 – 2018-08-14 (×2): 4 mg via INTRAVENOUS
  Filled 2018-08-13: qty 2

## 2018-08-13 MED ORDER — SODIUM CHLORIDE 0.9 % IV SOLN
2.0000 g | INTRAVENOUS | Status: DC
  Administered 2018-08-13: 2 g via INTRAVENOUS
  Filled 2018-08-13: qty 20
  Filled 2018-08-13: qty 2

## 2018-08-13 MED ORDER — LACTATED RINGERS IV SOLN
INTRAVENOUS | Status: DC
  Administered 2018-08-13 – 2018-08-14 (×4): via INTRAVENOUS

## 2018-08-13 MED ORDER — OXYCODONE HCL 5 MG PO TABS
5.0000 mg | ORAL_TABLET | ORAL | Status: DC | PRN
  Administered 2018-08-15: 10 mg via ORAL
  Filled 2018-08-13: qty 2

## 2018-08-13 MED ORDER — FENTANYL CITRATE (PF) 100 MCG/2ML IJ SOLN
50.0000 ug | INTRAMUSCULAR | Status: AC | PRN
  Administered 2018-08-13 (×2): 50 ug via INTRAVENOUS
  Filled 2018-08-13 (×2): qty 2

## 2018-08-13 MED ORDER — ACETAMINOPHEN 325 MG PO TABS
650.0000 mg | ORAL_TABLET | Freq: Four times a day (QID) | ORAL | Status: DC | PRN
  Administered 2018-08-14 (×3): 650 mg via ORAL
  Filled 2018-08-13 (×3): qty 2

## 2018-08-13 MED ORDER — METOPROLOL TARTRATE 5 MG/5ML IV SOLN
5.0000 mg | Freq: Four times a day (QID) | INTRAVENOUS | Status: DC | PRN
  Administered 2018-08-14: 2 mg via INTRAVENOUS
  Administered 2018-08-14: 3 mg via INTRAVENOUS

## 2018-08-13 MED ORDER — PROCHLORPERAZINE EDISYLATE 10 MG/2ML IJ SOLN
5.0000 mg | Freq: Four times a day (QID) | INTRAMUSCULAR | Status: DC | PRN
  Filled 2018-08-13: qty 2

## 2018-08-13 NOTE — ED Provider Notes (Signed)
[facility] Emergency Department Provider Note   ____________________________________________    I have reviewed the triage vital signs and the nursing notes.   HISTORY  Chief Complaint Abdominal Pain     HPI Kristi [name] is a 72 y.o. female who presents with complaints of abdominal pain.  Patient reports she was seen here yesterday and told that she had gallstones.  She reports she was feeling better when she was discharged and felt well last night.  This morning around 10 AM she started to have pain which is in her right upper quadrant which became more more severe.  She also reports nausea.  Has not taken anything for this.  Has not eaten this morning.  Denies fevers or chills.  No Chest pain   Past Medical History:  Diagnosis Date  . Acute MI, inferoposterior wall ([place]) 09/30/2014  . Coronary artery disease   . Diabetes insipidus ([place])   . Hypercholesteremia   . Hypertension   . MI, old     Patient Active Problem List   Diagnosis Date Noted  . Cholecystitis 08/13/2018  . COPD with exacerbation ([place]) 08/11/2018  . Osteopenia determined by x-ray 05/19/2018  . Prediabetes 02/08/2018  . Hx of fracture of radius 08/18/2016  . Vitamin D deficiency 01/07/2016  . Hyperlipidemia 01/02/2016  . Hypertension 01/02/2016  . Chronic pain 01/02/2016  . Obesity (BMI 30.0-34.9) 01/02/2016  . CAD (coronary artery disease) 01/02/2016  . IBS (irritable bowel syndrome) 01/02/2016  . FH: colon cancer 01/02/2016  . DDD (degenerative disc disease), cervical 01/02/2016  . Primary osteoarthritis of left knee 01/02/2016  . Smoker 01/02/2016    Past Surgical History:  Procedure Laterality Date  . ABDOMINAL SURGERY    . APPENDECTOMY    . BLADDER NECK SUSPENSION    . cardiac stents    . HERNIA REPAIR    . LEFT HEART CATHETERIZATION WITH CORONARY ANGIOGRAM N/A 09/29/2014   Procedure: LEFT HEART CATHETERIZATION WITH CORONARY ANGIOGRAM;  Surgeon: Clent Demark, MD;  Location: [facility] CATH LAB;  Service: Cardiovascular;  Laterality: N/A;    Prior to Admission medications   Medication Sig Start Date End Date Taking? Authorizing Provider  aspirin 81 MG chewable tablet Chew 1 tablet (81 mg total) by mouth daily. 10/02/14   Charolette Forward, MD  atorvastatin (LIPITOR) 80 MG tablet Take 0.5 tablets (40 mg total) by mouth daily at 6 PM. 01/02/16   Plonk, Gwyndolyn Saxon, MD  B Complex Vitamins (VITAMIN B COMPLEX PO) Take by mouth.    [provider]  Calcium 250 MG CAPS Take by mouth.    [provider]  cephALEXin (KEFLEX) 500 MG capsule Take 1 capsule (500 mg total) by mouth 4 (four) times daily for 5 days. 08/12/18 08/17/18  Eula Listen, MD  cetirizine (ZYRTEC) 10 MG tablet Take 10 mg by mouth daily.    [provider]  Cholecalciferol (VITAMIN D) 2000 units CAPS Take 1 capsule (2,000 Units total) by mouth daily. 01/07/16   Plonk, Gwyndolyn Saxon, MD  metoprolol tartrate (LOPRESSOR) 25 MG tablet Take 12.5 mg by mouth 2 (two) times daily.    [provider]  nitroGLYCERIN (NITROSTAT) 0.4 MG SL tablet Place 1 tablet (0.4 mg total) under the tongue every 5 (five) minutes x 3 doses as needed for chest pain. 10/02/14   Charolette Forward, MD  ondansetron (ZOFRAN ODT) 4 MG disintegrating tablet Take 1 tablet (4 mg total) by mouth every 8 (eight) hours as needed for  nausea or vomiting. 08/12/18   Eula Listen, MD  oxyCODONE-acetaminophen (PERCOCET) 5-325 MG tablet Take 1 tablet by mouth every 4 (four) hours as needed for severe pain. 08/12/18 08/12/19  Eula Listen, MD     Allergies Patient has no known allergies.  Family History  Problem Relation Age of Onset  . Cancer Mother   . Cancer Father   . Breast cancer Neg Hx     Social History Social History   Tobacco Use  . Smoking status: Current Every Day Smoker    Packs/day: 0.50    Years: 39.00    Pack years: 19.50    Types: Cigarettes    Start date: 12/21/1978    . Smokeless tobacco: Never Used  . Tobacco comment: 3-4 daily  Substance Use Topics  . Alcohol use: Yes    Alcohol/week: 0.0 standard drinks    Comment: occassional  . Drug use: No    Review of Systems  Constitutional: No fever/chills Eyes: No visual changes.  ENT: No sore throat. Cardiovascular: Denies chest pain. Respiratory: Denies shortness of breath. Gastrointestinal: As above Genitourinary: Negative for dysuria. Musculoskeletal: Negative for back pain. Skin: Negative for rash. Neurological: Negative for headaches   ____________________________________________   PHYSICAL EXAM:  VITAL SIGNS: ED Triage Vitals  Enc Vitals Group     BP 08/13/18 1617 (!) 151/83     Pulse Rate 08/13/18 1617 63     Resp 08/13/18 1617 18     Temp 08/13/18 1617 97.7 F (36.5 C)     Temp Source 08/13/18 1617 Oral     SpO2 08/13/18 1617 96 %     Weight 08/13/18 1617 80.3 kg (177 lb)     Height 08/13/18 1617 1.524 m (5')     Head Circumference --      Peak Flow --      Pain Score 08/13/18 1625 10     Pain Loc --      Pain Edu? --      Excl. in [place]? --     Constitutional: Alert and oriented. No acute distress. Pleasant and interactive Eyes: Conjunctivae are normal.   Nose: No congestion/rhinnorhea. Mouth/Throat: Mucous membranes are moist.    Cardiovascular: Normal rate, regular rhythm. Grossly normal heart sounds.  Good peripheral circulation. Respiratory: Normal respiratory effort.  No retractions.  Gastrointestinal: Significant tenderness over the right upper quadrant, no distention Musculoskeletal: No lower extremity tenderness nor edema.  Warm and well perfused Neurologic:  Normal speech and language. No gross focal neurologic deficits are appreciated.  Skin:  Skin is warm, dry and intact. No rash noted. Psychiatric: Mood and affect are normal. Speech and behavior are normal.  ____________________________________________   LABS (all labs ordered are listed, but only  abnormal results are displayed)  Labs Reviewed  COMPREHENSIVE METABOLIC PANEL - Abnormal; Notable for the following components:      Result Value   Sodium 129 (*)    Potassium 3.4 (*)    Glucose, Bld 135 (*)    BUN 7 (*)    All other components within normal limits  CBC - Abnormal; Notable for the following components:   WBC 12.0 (*)    All other components within normal limits  LIPASE, BLOOD  TROPONIN I   ____________________________________________  EKG  ED ECG REPORT I, Lavonia Drafts, the attending physician, personally viewed and interpreted this ECG.  Date: 08/13/2018  Rhythm: normal sinus rhythm QRS Axis: normal Intervals: normal ST/T Wave abnormalities: normal Narrative Interpretation: no evidence of  acute ischemia  ____________________________________________  RADIOLOGY  CT scan and ultrasound reviewed from yesterday ____________________________________________   PROCEDURES  Procedure(s) performed: No  Procedures   Critical Care performed: No ____________________________________________   INITIAL IMPRESSION / ASSESSMENT AND PLAN / ED COURSE  Pertinent labs & imaging results that were available during my care of the patient were reviewed by me and considered in my medical decision making (see chart for details).  Patient presents with right upper quadrant pain, ultrasound from yesterday concerning for possible cholecystitis.  Lab work today with increased white blood cell count.  Patient is received 2 doses of IV fentanyl.  Discussed with Dr. Dahlia Byes of surgery, he will admit the patient.    ____________________________________________   FINAL CLINICAL IMPRESSION(S) / ED DIAGNOSES  Final diagnoses:  Cholecystitis        Note:  This document was prepared using Dragon voice recognition software and may include unintentional dictation errors.    Lavonia Drafts, MD 08/13/18 [phone]

## 2018-08-13 NOTE — ED Triage Notes (Signed)
Epigastric pain began yesterday, was seen yesterday and told her she has stones in her gallbladder.

## 2018-08-14 ENCOUNTER — Encounter
Admission: EM | Disposition: A | Discharge status: Home / Self Care | Payer: Self-pay | Source: Home / Self Care | Attending: Surgery

## 2018-08-14 ENCOUNTER — Encounter: Payer: Self-pay | Admitting: *Deleted

## 2018-08-14 ENCOUNTER — Observation Stay: Payer: Medicare Other | Admitting: Certified Registered"

## 2018-08-14 DIAGNOSIS — R0602 Shortness of breath: Secondary | ICD-10-CM

## 2018-08-14 DIAGNOSIS — J432 Centrilobular emphysema: Secondary | ICD-10-CM

## 2018-08-14 DIAGNOSIS — I25118 Atherosclerotic heart disease of native coronary artery with other forms of angina pectoris: Secondary | ICD-10-CM | POA: Diagnosis not present

## 2018-08-14 DIAGNOSIS — K8012 Calculus of gallbladder with acute and chronic cholecystitis without obstruction: Secondary | ICD-10-CM | POA: Diagnosis not present

## 2018-08-14 DIAGNOSIS — I10 Essential (primary) hypertension: Secondary | ICD-10-CM

## 2018-08-14 DIAGNOSIS — K819 Cholecystitis, unspecified: Secondary | ICD-10-CM

## 2018-08-14 DIAGNOSIS — F17200 Nicotine dependence, unspecified, uncomplicated: Secondary | ICD-10-CM

## 2018-08-14 DIAGNOSIS — K8020 Calculus of gallbladder without cholecystitis without obstruction: Secondary | ICD-10-CM | POA: Diagnosis not present

## 2018-08-14 DIAGNOSIS — E7800 Pure hypercholesterolemia, unspecified: Secondary | ICD-10-CM | POA: Diagnosis not present

## 2018-08-14 DIAGNOSIS — J441 Chronic obstructive pulmonary disease with (acute) exacerbation: Secondary | ICD-10-CM | POA: Diagnosis not present

## 2018-08-14 HISTORY — PX: CHOLECYSTECTOMY: SHX55

## 2018-08-14 LAB — BASIC METABOLIC PANEL
Anion gap: 11 (ref 5–15)
BUN: 5 mg/dL — ABNORMAL LOW (ref 8–23)
CALCIUM: 8.9 mg/dL (ref 8.9–10.3)
CO2: 21 mmol/L — AB (ref 22–32)
CREATININE: 0.59 mg/dL (ref 0.44–1.00)
Chloride: 97 mmol/L — ABNORMAL LOW (ref 98–111)
GFR calc Af Amer: >60 mL/min (ref >60)
GFR calc non Af Amer: >60 mL/min (ref >60)
GLUCOSE: 122 mg/dL — AB (ref 70–99)
Potassium: 3.5 mmol/L (ref 3.5–5.1)
Sodium: 129 mmol/L — ABNORMAL LOW (ref 135–145)

## 2018-08-14 LAB — URINE CULTURE

## 2018-08-14 LAB — SURGICAL PCR SCREEN
MRSA, PCR: NEGATIVE
Staphylococcus aureus: NEGATIVE

## 2018-08-14 LAB — GLUCOSE, CAPILLARY: GLUCOSE-CAPILLARY: 124 mg/dL — AB (ref 70–99)

## 2018-08-14 SURGERY — LAPAROSCOPIC CHOLECYSTECTOMY
Anesthesia: General

## 2018-08-14 MED ORDER — PHENYLEPHRINE HCL 10 MG/ML IJ SOLN
INTRAMUSCULAR | Status: DC | PRN
  Administered 2018-08-14: 200 ug via INTRAVENOUS
  Administered 2018-08-14 (×2): 100 ug via INTRAVENOUS

## 2018-08-14 MED ORDER — ROCURONIUM BROMIDE 50 MG/5ML IV SOLN
INTRAVENOUS | Status: AC
  Filled 2018-08-14: qty 1

## 2018-08-14 MED ORDER — PROPOFOL 10 MG/ML IV BOLUS
INTRAVENOUS | Status: AC
  Filled 2018-08-14: qty 20

## 2018-08-14 MED ORDER — BUPIVACAINE-EPINEPHRINE (PF) 0.25% -1:200000 IJ SOLN
INTRAMUSCULAR | Status: AC
  Filled 2018-08-14: qty 30

## 2018-08-14 MED ORDER — BUPIVACAINE-EPINEPHRINE 0.25% -1:200000 IJ SOLN
INTRAMUSCULAR | Status: DC | PRN
  Administered 2018-08-14: 30 mL

## 2018-08-14 MED ORDER — FENTANYL CITRATE (PF) 100 MCG/2ML IJ SOLN
INTRAMUSCULAR | Status: DC | PRN
  Administered 2018-08-14: 25 ug via INTRAVENOUS
  Administered 2018-08-14: 75 ug via INTRAVENOUS

## 2018-08-14 MED ORDER — PROPOFOL 10 MG/ML IV BOLUS
INTRAVENOUS | Status: DC | PRN
  Administered 2018-08-14: 100 mg via INTRAVENOUS

## 2018-08-14 MED ORDER — DEXAMETHASONE SODIUM PHOSPHATE 10 MG/ML IJ SOLN
INTRAMUSCULAR | Status: DC | PRN
  Administered 2018-08-14: 5 mg via INTRAVENOUS

## 2018-08-14 MED ORDER — FENTANYL CITRATE (PF) 100 MCG/2ML IJ SOLN
INTRAMUSCULAR | Status: AC
  Filled 2018-08-14: qty 2

## 2018-08-14 MED ORDER — ONDANSETRON HCL 4 MG/2ML IJ SOLN: 4.0000 mg | Freq: Once | INTRAMUSCULAR | Status: DC | PRN

## 2018-08-14 MED ORDER — ALBUTEROL SULFATE HFA 108 (90 BASE) MCG/ACT IN AERS
INHALATION_SPRAY | RESPIRATORY_TRACT | Status: DC | PRN
  Administered 2018-08-14 (×2): 4 via RESPIRATORY_TRACT

## 2018-08-14 MED ORDER — LIDOCAINE HCL (PF) 2 % IJ SOLN
INTRAMUSCULAR | Status: AC
  Filled 2018-08-14: qty 10

## 2018-08-14 MED ORDER — ALUM & MAG HYDROXIDE-SIMETH 200-200-20 MG/5ML PO SUSP
30.0000 mL | Freq: Four times a day (QID) | ORAL | Status: DC | PRN
  Administered 2018-08-14: 30 mL via ORAL
  Filled 2018-08-14: qty 30

## 2018-08-14 MED ORDER — PIPERACILLIN-TAZOBACTAM 3.375 G IVPB 30 MIN: 3.3750 g | Freq: Three times a day (TID) | INTRAVENOUS | Status: DC

## 2018-08-14 MED ORDER — LIDOCAINE HCL (CARDIAC) PF 100 MG/5ML IV SOSY
PREFILLED_SYRINGE | INTRAVENOUS | Status: DC | PRN
  Administered 2018-08-14: 80 mg via INTRAVENOUS

## 2018-08-14 MED ORDER — FENTANYL CITRATE (PF) 100 MCG/2ML IJ SOLN: 25.0000 ug | INTRAMUSCULAR | Status: DC | PRN

## 2018-08-14 MED ORDER — BUPIVACAINE LIPOSOME 1.3 % IJ SUSP
INTRAMUSCULAR | Status: AC
  Filled 2018-08-14: qty 20

## 2018-08-14 MED ORDER — ONDANSETRON HCL 4 MG/2ML IJ SOLN
INTRAMUSCULAR | Status: AC
  Filled 2018-08-14: qty 2

## 2018-08-14 MED ORDER — ROCURONIUM BROMIDE 100 MG/10ML IV SOLN
INTRAVENOUS | Status: DC | PRN
  Administered 2018-08-14: 10 mg via INTRAVENOUS
  Administered 2018-08-14: 40 mg via INTRAVENOUS

## 2018-08-14 MED ORDER — BUPIVACAINE LIPOSOME 1.3 % IJ SUSP
INTRAMUSCULAR | Status: DC | PRN
  Administered 2018-08-14: 20 mL

## 2018-08-14 MED ORDER — PIPERACILLIN-TAZOBACTAM 3.375 G IVPB
3.3750 g | Freq: Three times a day (TID) | INTRAVENOUS | Status: DC
  Administered 2018-08-14 – 2018-08-15 (×3): 3.375 g via INTRAVENOUS
  Filled 2018-08-14 (×3): qty 50

## 2018-08-14 MED ORDER — VASOPRESSIN 20 UNIT/ML IV SOLN
INTRAVENOUS | Status: DC | PRN
  Administered 2018-08-14 (×2): 1 [IU] via INTRAVENOUS

## 2018-08-14 MED ORDER — SUGAMMADEX SODIUM 200 MG/2ML IV SOLN
INTRAVENOUS | Status: DC | PRN
  Administered 2018-08-14: 175 mg via INTRAVENOUS

## 2018-08-14 SURGICAL SUPPLY — 57 items
APPLICATOR COTTON TIP 6 STRL (MISCELLANEOUS) IMPLANT
APPLICATOR COTTON TIP 6IN STRL (MISCELLANEOUS)
APPLIER CLIP 5 13 M/L LIGAMAX5 (MISCELLANEOUS) ×3
BLADE SURG 15 STRL LF DISP TIS (BLADE) ×1 IMPLANT
BLADE SURG 15 STRL SS (BLADE) ×2
BULB RESERV EVAC DRAIN JP 100C (MISCELLANEOUS) ×3 IMPLANT
CANISTER SUCT 1200ML W/VALVE (MISCELLANEOUS) IMPLANT
CANISTER SUCT 3000ML (MISCELLANEOUS) ×3 IMPLANT
CHLORAPREP W/TINT 26ML (MISCELLANEOUS) ×3 IMPLANT
CHOLANGIOGRAM CATH TAUT (CATHETERS) IMPLANT
CLEANER CAUTERY TIP 5X5 PAD (MISCELLANEOUS) ×1 IMPLANT
CLIP APPLIE 5 13 M/L LIGAMAX5 (MISCELLANEOUS) ×1 IMPLANT
DECANTER SPIKE VIAL GLASS SM (MISCELLANEOUS) IMPLANT
DERMABOND ADVANCED (GAUZE/BANDAGES/DRESSINGS) ×2
DERMABOND ADVANCED .7 DNX12 (GAUZE/BANDAGES/DRESSINGS) ×1 IMPLANT
DRAIN CHANNEL JP 15F RND 16 (MISCELLANEOUS) ×3 IMPLANT
DRAPE C-ARM XRAY 36X54 (DRAPES) IMPLANT
DRAPE INCISE IOBAN 66X45 STRL (DRAPES) ×3 IMPLANT
DRSG OPSITE POSTOP 4X8 (GAUZE/BANDAGES/DRESSINGS) ×3 IMPLANT
ELECT CAUTERY BLADE 6.4 (BLADE) ×3 IMPLANT
ELECT REM PT RETURN 9FT ADLT (ELECTROSURGICAL) ×3
ELECTRODE REM PT RTRN 9FT ADLT (ELECTROSURGICAL) ×1 IMPLANT
GLOVE BIO SURGEON STRL SZ7 (GLOVE) ×9 IMPLANT
GOWN STRL REUS W/ TWL LRG LVL3 (GOWN DISPOSABLE) ×2 IMPLANT
GOWN STRL REUS W/TWL LRG LVL3 (GOWN DISPOSABLE) ×4
IRRIGATION STRYKERFLOW (MISCELLANEOUS) ×1 IMPLANT
IRRIGATOR STRYKERFLOW (MISCELLANEOUS) ×3
IV CATH ANGIO 12GX3 LT BLUE (NEEDLE) IMPLANT
IV NS 1000ML (IV SOLUTION) ×2
IV NS 1000ML BAXH (IV SOLUTION) ×1 IMPLANT
L-HOOK LAP DISP 36CM (ELECTROSURGICAL) ×3
LHOOK LAP DISP 36CM (ELECTROSURGICAL) ×1 IMPLANT
NEEDLE HYPO 22GX1.5 SAFETY (NEEDLE) ×3 IMPLANT
PACK LAP CHOLECYSTECTOMY (MISCELLANEOUS) ×3 IMPLANT
PAD CLEANER CAUTERY TIP 5X5 (MISCELLANEOUS) ×2
PENCIL ELECTRO HAND CTR (MISCELLANEOUS) ×3 IMPLANT
POUCH SPECIMEN RETRIEVAL 10MM (ENDOMECHANICALS) ×3 IMPLANT
SCISSORS METZENBAUM CVD 33 (INSTRUMENTS) ×3 IMPLANT
SLEEVE ENDOPATH XCEL 5M (ENDOMECHANICALS) ×9 IMPLANT
SOL ANTI-FOG 6CC FOG-OUT (MISCELLANEOUS) ×1 IMPLANT
SOL FOG-OUT ANTI-FOG 6CC (MISCELLANEOUS) ×2
SPONGE GAUZE 2X2 8PLY STER LF (GAUZE/BANDAGES/DRESSINGS) ×3
SPONGE GAUZE 2X2 8PLY STRL LF (GAUZE/BANDAGES/DRESSINGS) ×6 IMPLANT
SPONGE LAP 18X18 RF (DISPOSABLE) ×6 IMPLANT
STAPLER SKIN PROX 35W (STAPLE) ×3 IMPLANT
STOPCOCK 4 WAY LG BORE MALE ST (IV SETS) IMPLANT
SUT ETHIBOND 0 MO6 C/R (SUTURE) IMPLANT
SUT MNCRL AB 4-0 PS2 18 (SUTURE) ×3 IMPLANT
SUT VICRYL 0 AB UR-6 (SUTURE) ×6 IMPLANT
SYR 20CC LL (SYRINGE) ×3 IMPLANT
SYS LAPSCP GELPORT 120MM (MISCELLANEOUS) ×3
SYSTEM LAPSCP GELPORT 120MM (MISCELLANEOUS) ×1 IMPLANT
TOWEL OR 17X26 4PK STRL BLUE (TOWEL DISPOSABLE) ×3 IMPLANT
TROCAR XCEL BLUNT TIP 100MML (ENDOMECHANICALS) ×3 IMPLANT
TROCAR XCEL NON-BLD 5MMX100MML (ENDOMECHANICALS) ×3 IMPLANT
TUBING INSUFFLATION (TUBING) ×3 IMPLANT
WATER STERILE IRR 1000ML POUR (IV SOLUTION) IMPLANT

## 2018-08-14 NOTE — Progress Notes (Signed)
Pharmacy Antibiotic Note  Kristi [name] is a 72 y.o. female admitted on 08/13/2018 with cholecystitis.  Pharmacy has been consulted for Zosyn dosing for intraabdominal infection.  Plan: Zosyn 3.375g IV q8h (4 hour infusion).  Height: 5' (152.4 cm) Weight: 177 lb (80.3 kg) IBW/kg (Calculated) : 45.5  Temp (24hrs), Avg:98.7 F (37.1 C), Min:97.9 F (36.6 C), Max:100.6 F (38.1 C)  Recent Labs  Lab 08/12/18 1438 08/13/18 1629 08/14/18 0557  WBC 9.9 12.0*  --   CREATININE 0.74 0.54 0.59    Estimated Creatinine Clearance: 60.5 mL/min (by C-G formula based on SCr of 0.59 mg/dL).    No Known Allergies  Antimicrobials this admission: Ceftriaxone 8/24 >> 8/25 Zosyn 8/25 >>   Thank you for allowing pharmacy to be a part of this patient's care.  Paulina Fusi, PharmD, BCPS 08/14/2018 7:05 PM

## 2018-08-14 NOTE — Anesthesia Preprocedure Evaluation (Signed)
Anesthesia Evaluation  Patient identified by MRN, date of birth, ID band Patient awake    Reviewed: Allergy & Precautions, H&P , NPO status , Patient's Chart, lab work & pertinent test results, reviewed documented beta blocker date and time   Airway Mallampati: II  TM Distance: >3 FB Neck ROM: full    Dental  (+) Teeth Intact   Pulmonary neg pulmonary ROS, COPD,  COPD inhaler, Current Smoker,    Pulmonary exam normal        Cardiovascular Exercise Tolerance: Poor hypertension, On Medications (-) angina+ CAD and + Past MI  negative cardio ROS Normal cardiovascular exam Rhythm:regular Rate:Normal     Neuro/Psych negative neurological ROS  negative psych ROS   GI/Hepatic negative GI ROS, Neg liver ROS,   Endo/Other  negative endocrine ROS  Renal/GU negative Renal ROS  negative genitourinary   Musculoskeletal   Abdominal   Peds  Hematology negative hematology ROS (+)   Anesthesia Other Findings Past Medical History: 09/30/2014: Acute MI, inferoposterior wall (HCC) No date: Coronary artery disease 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[place]) No date: Hypercholesteremia No date: Hypertension No date: MI, old Past Surgical History: No date: ABDOMINAL SURGERY No date: APPENDECTOMY No date: BLADDER NECK SUSPENSION No date: cardiac stents No date: HERNIA REPAIR 09/29/2014: LEFT HEART CATHETERIZATION WITH CORONARY ANGIOGRAM; N/A     Comment:  Procedure: LEFT HEART CATHETERIZATION WITH CORONARY               ANGIOGRAM;  Surgeon: Clent Demark, MD;  Location: [place]               CATH LAB;  Service: Cardiovascular;  Laterality: N/A; BMI    Body Mass Index:  34.57 kg/m     Reproductive/Obstetrics negative OB ROS                             Anesthesia Physical Anesthesia Plan  ASA: III and emergent  Anesthesia Plan: General ETT   Post-op Pain Management:    Induction:   PONV Risk Score and  Plan:   Airway Management Planned:   Additional Equipment:   Intra-op Plan:   Post-operative Plan:   Informed Consent: I have reviewed the patients History and Physical, chart, labs and discussed the procedure including the risks, benefits and alternatives for the proposed anesthesia with the patient or authorized representative who has indicated his/her understanding and acceptance.   Dental Advisory Given  Plan Discussed with: CRNA  Anesthesia Plan Comments:         Anesthesia Quick Evaluation

## 2018-08-14 NOTE — H&P (Signed)
Patient ID: Kristi [name], female   DOB: Jul 28, 1946, 72 y.o.   MRN: [id number]  History of Present Illness Kristi [name] is a 72 y.o. female with a 3 day hx of chest pain and RUQ pain. It started in her chest and localized to the upper abdomen and to her RUQ. Sharp moderate to severe, intermittent, no specific alleviating or aggravating factors. She had nausea and vomiting. No fevers, chills. No biliary obstruction, jaundice or chalnagitis Prior hx of hepatic resection  And appendectomy when she was 72 yo, 4-5 years ago had open ventral hernia w mesh [facility].  She is able to perform more than 4 mets w/o SOB or C/P. She did have MI 5 years ago and had stent placed. Only takes ASA. U/S showing GS, nml CBD, some pericholecystic fluid and mild wall thickening. WBC 12, LFT normal. Trop Negative and EKG no ischemic changes    Past Medical History Past Medical History:  Diagnosis Date  . Acute MI, inferoposterior wall ([place]) 09/30/2014  . Coronary artery disease   . Diabetes insipidus ([place])   . Hypercholesteremia   . Hypertension   . MI, old       Past Surgical History:  Procedure Laterality Date  . ABDOMINAL SURGERY    . APPENDECTOMY    . BLADDER NECK SUSPENSION    . cardiac stents    . HERNIA REPAIR    . LEFT HEART CATHETERIZATION WITH CORONARY ANGIOGRAM N/A 09/29/2014   Procedure: LEFT HEART CATHETERIZATION WITH CORONARY ANGIOGRAM;  Surgeon: Clent Demark, MD;  Location: [facility] CATH LAB;  Service: Cardiovascular;  Laterality: N/A;    No Known Allergies  Current Facility-Administered Medications  Medication Dose Route Frequency Provider Last Rate Last Dose  . acetaminophen (TYLENOL) tablet 650 mg  650 mg Oral Q6H PRN Caroleen Hamman F, MD   650 mg at 08/14/18 0444   Or  . acetaminophen (TYLENOL) suppository 650 mg  650 mg Rectal Q6H PRN Delrose Rohwer F, MD      . cefTRIAXone (ROCEPHIN) 2 g in sodium chloride 0.9 % 100 mL IVPB  2 g Intravenous Q24H Jules Husbands, MD   Stopped at  08/13/18 2350  . heparin injection 5,000 Units  5,000 Units Subcutaneous Q8H Jules Husbands, MD   5,000 Units at 08/14/18 0601  . ketorolac (TORADOL) 30 MG/ML injection 30 mg  30 mg Intravenous Q6H PRN Garyson Stelly F, MD      . lactated ringers infusion   Intravenous Continuous Eschol Auxier, [place] F, MD 100 mL/hr at 08/14/18 0600    . metoprolol tartrate (LOPRESSOR) injection 5 mg  5 mg Intravenous Q6H PRN Gabrielle Mester F, MD      . metoprolol tartrate (LOPRESSOR) tablet 12.5 mg  12.5 mg Oral BID Kalsey Lull, [place] F, MD   12.5 mg at 08/13/18 2315  . morphine 4 MG/ML injection 4 mg  4 mg Intravenous Q3H PRN Jules Husbands, MD   4 mg at 08/14/18 0444  . nitroGLYCERIN (NITROSTAT) SL tablet 0.4 mg  0.4 mg Sublingual Q5 Min x 3 PRN Talyia Allende F, MD      . ondansetron (ZOFRAN-ODT) disintegrating tablet 4 mg  4 mg Oral Q6H PRN Naziah Weckerly F, MD       Or  . ondansetron (ZOFRAN) injection 4 mg  4 mg Intravenous Q6H PRN Jules Husbands, MD   4 mg at 08/13/18 2001  . oxyCODONE (Oxy IR/ROXICODONE) immediate release tablet 5-10 mg  5-10 mg Oral  Q4H PRN Deshauna Cayson F, MD      . potassium chloride SA (K-DUR,KLOR-CON) CR tablet 40 mEq  40 mEq Oral BID Caroleen Hamman F, MD   40 mEq at 08/13/18 1953  . prochlorperazine (COMPAZINE) tablet 10 mg  10 mg Oral Q6H PRN Yana Schorr F, MD       Or  . prochlorperazine (COMPAZINE) injection 5-10 mg  5-10 mg Intravenous Q6H PRN Aiyana Stegmann, Marjory Lies, MD        Family History Family History  Problem Relation Age of Onset  . Cancer Mother   . Cancer Father   . Breast cancer Neg Hx        Social History Social History   Tobacco Use  . Smoking status: Current Every Day Smoker    Packs/day: 0.50    Years: 39.00    Pack years: 19.50    Types: Cigarettes    Start date: 12/21/1978  . Smokeless tobacco: Never Used  . Tobacco comment: 3-4 daily  Substance Use Topics  . Alcohol use: Yes    Alcohol/week: 0.0 standard drinks    Comment: occassional  . Drug use: No     Full ROS of  systems performed and is otherwise negative there than what is stated in the HPI  Physical Exam Blood pressure (!) 132/99, pulse 88, temperature (!) 100.6 F (38.1 C), temperature source Oral, resp. rate 18, height 5' (1.524 m), weight 80.3 kg, SpO2 92 %.  CONSTITUTIONAL: NAD EYES: Pupils equal, round, and reactive to light, Sclera non-icteric. EARS, NOSE, MOUTH AND THROAT: The oropharynx is clear. Oral mucosa is pink and moist. Hearing is intact to voice.  NECK: Trachea is midline, and there is no jugular venous distension. Thyroid is without palpable abnormalities. LYMPH NODES:  Lymph nodes in the neck are not enlarged. RESPIRATORY:  Lungs are clear, and breath sounds are equal bilaterally. Normal respiratory effort without pathologic use of accessory muscles. CARDIOVASCULAR: Heart is regular without murmurs, gallops, or rubs. GI: The abdomen is  soft, TTP RUQ w + Murphy sign. No peritonitis.  There were no palpable masses. There was no hepatosplenomegaly. There were normal bowel sounds.  MUSCULOSKELETAL:  Normal muscle strength and tone in all four extremities.    SKIN: Skin turgor is normal. There are no pathologic skin lesions.  NEUROLOGIC:  Motor and sensation is grossly normal.  Cranial nerves are grossly intact. PSYCH:  Alert and oriented to person, place and time. Affect is normal.  Data Reviewed   I have personally reviewed the patient's imaging and medical records.    Assessment/Plan 72 yo female w CAD presents with acute cholecystitis. We will admit, IV fluids , start antibiotics and proceed with cholecystectomy today. D/W the pt in detail about her condition.  I discussed the procedure in detail.  The patient was given Neurosurgeon.  We discussed the risks and benefits of a laparoscopic cholecystectomy and possible cholangiogram including, but not limited to bleeding, infection, injury to surrounding structures such as the intestine or liver, bile leak, retained  gallstones, need to convert to an open procedure, prolonged diarrhea, blood clots such as  DVT, common bile duct injury, anesthesia risks, and possible need for additional procedures.  The likelihood of improvement in symptoms and return to the patient's normal status is good. We discussed the typical post-operative recovery course.  [place], MD [place] 08/14/2018, 9:08 AM

## 2018-08-14 NOTE — Consult Note (Signed)
Cardiology Consultation:   Patient ID: Kristi [name] [facility]; [id number]; 04-18-1946   Admit date: 08/13/2018 Date of Consult: 08/14/2018  Primary Care Provider: Glean Hess, MD Primary Cardiologist: New to [facility] Physician requesting consult : Dr. Dahlia Byes Reason for consult: shortness of breath, CAD   Patient Profile:   Kristi [name] is a 72 y.o. female with a hx of CAD, prior STEMI October 2015 with stent to the RCA, long smoking history 40 years, active smoker, hypertension, diabetes insipidus, hyperlipidemia, presenting with upper epigastric pain  History of Present Illness:   Ms. [place] reports having severe pain in the past 3 days Severe discomfort upper abdomen radiating into the chest Friday, August 23 Episodes of diaphoresis with her epigastric discomfort Ultrasound at that time showed cholelithiasis, mild wall thickening findings equivocal for cholecystitis CT scan showing dilated gallbladder Also with diverticular disease of the colon She went home but then presented again to the hospital in next day with recurrent severe abdominal pain and diaphoresis Chronic enzymes negative 3, lipase normal, wall LFTs Sodium 129 potassium 3.4  Prior to recent abdominal discomfort she reports chronic mild shortness of breath on exertion but no chest pressure When she had STEMI 2015 had chest pressure She is otherwise active, good ADLs   Past Medical History:  Diagnosis Date  . Acute MI, inferoposterior wall ([place]) 09/30/2014  . Coronary artery disease   . Diabetes insipidus ([place])   . Hypercholesteremia   . Hypertension   . MI, old     Past Surgical History:  Procedure Laterality Date  . ABDOMINAL SURGERY    . APPENDECTOMY    . BLADDER NECK SUSPENSION    . cardiac stents    . HERNIA REPAIR    . LEFT HEART CATHETERIZATION WITH CORONARY ANGIOGRAM N/A 09/29/2014   Procedure: LEFT HEART CATHETERIZATION WITH CORONARY ANGIOGRAM;  Surgeon: Clent Demark, MD;  Location: [facility]  CATH LAB;  Service: Cardiovascular;  Laterality: N/A;     Home Medications:  Prior to Admission medications   Medication Sig Start Date End Date Taking? Authorizing Provider  aspirin 81 MG chewable tablet Chew 1 tablet (81 mg total) by mouth daily. 10/02/14  Yes Charolette Forward, MD  atorvastatin (LIPITOR) 80 MG tablet Take 0.5 tablets (40 mg total) by mouth daily at 6 PM. 01/02/16  Yes Plonk, Gwyndolyn Saxon, MD  B Complex Vitamins (VITAMIN B COMPLEX PO) Take 1 tablet by mouth daily.    Yes [provider]  CALCIUM PO Take 1 tablet by mouth daily.    Yes [provider]  cephALEXin (KEFLEX) 500 MG capsule Take 1 capsule (500 mg total) by mouth 4 (four) times daily for 5 days. 08/12/18 08/17/18 Yes Eula Listen, MD  cetirizine (ZYRTEC) 10 MG tablet Take 10 mg by mouth daily.   Yes [provider]  Cholecalciferol (VITAMIN D) 2000 units CAPS Take 1 capsule (2,000 Units total) by mouth daily. 01/07/16  Yes Plonk, Gwyndolyn Saxon, MD  metoprolol tartrate (LOPRESSOR) 25 MG tablet Take 12.5 mg by mouth 2 (two) times daily.   Yes [provider]  nitroGLYCERIN (NITROSTAT) 0.4 MG SL tablet Place 1 tablet (0.4 mg total) under the tongue every 5 (five) minutes x 3 doses as needed for chest pain. 10/02/14  Yes Charolette Forward, MD  ondansetron (ZOFRAN ODT) 4 MG disintegrating tablet Take 1 tablet (4 mg total) by mouth every 8 (eight) hours as needed for nausea or vomiting. 08/12/18  Yes Eula Listen, MD  oxyCODONE-acetaminophen (PERCOCET) 5-325 MG tablet Take  1 tablet by mouth every 4 (four) hours as needed for severe pain. 08/12/18 08/12/19 Yes Eula Listen, MD    Inpatient Medications: Scheduled Meds: . heparin  5,000 Units Subcutaneous Q8H  . metoprolol tartrate  12.5 mg Oral BID  . potassium chloride  40 mEq Oral BID   Continuous Infusions: . cefTRIAXone (ROCEPHIN)  IV Stopped (08/13/18 2350)  . lactated ringers 40 mL/hr at 08/14/18 1335   PRN  Meds: acetaminophen **OR** acetaminophen, ketorolac, metoprolol tartrate, morphine injection, nitroGLYCERIN, ondansetron **OR** ondansetron (ZOFRAN) IV, oxyCODONE, prochlorperazine **OR** prochlorperazine  Allergies:   No Known Allergies  Social History:   Social History   Socioeconomic History  . Marital status: Married    Spouse name: Not on file  . Number of children: Not on file  . Years of education: Not on file  . Highest education level: Not on file  Occupational History  . Not on file  Social Needs  . Financial resource strain: Not on file  . Food insecurity:    Worry: Not on file    Inability: Not on file  . Transportation needs:    Medical: Not on file    Non-medical: Not on file  Tobacco Use  . Smoking status: Current Every Day Smoker    Packs/day: 0.50    Years: 39.00    Pack years: 19.50    Types: Cigarettes    Start date: 12/21/1978  . Smokeless tobacco: Never Used  . Tobacco comment: 3-4 daily  Substance and Sexual Activity  . Alcohol use: Yes    Alcohol/week: 0.0 standard drinks    Comment: occassional  . Drug use: No  . Sexual activity: Yes    Birth control/protection: None  Lifestyle  . Physical activity:    Days per week: Not on file    Minutes per session: Not on file  . Stress: Not on file  Relationships  . Social connections:    Talks on phone: Not on file    Gets together: Not on file    Attends religious service: Not on file    Active member of club or organization: Not on file    Attends meetings of clubs or organizations: Not on file    Relationship status: Not on file  . Intimate partner violence:    Fear of current or ex partner: Not on file    Emotionally abused: Not on file    Physically abused: Not on file    Forced sexual activity: Not on file  Other Topics Concern  . Not on file  Social History Narrative  . Not on file    Family History:    Family History  Problem Relation Age of Onset  . Cancer Mother   . Cancer  Father   . Breast cancer Neg Hx      ROS:  Please see the history of present illness.  Review of Systems  Constitutional: Negative.   Respiratory: Positive for shortness of breath.   Cardiovascular: Negative.   Gastrointestinal: Positive for abdominal pain.  Musculoskeletal: Negative.   Neurological: Negative.   Psychiatric/Behavioral: Negative.   All other systems reviewed and are negative.    Physical Exam/Data:   Vitals:   08/13/18 2035 08/13/18 2105 08/14/18 0429 08/14/18 1204  BP: (!) 146/94 (!) 156/71 (!) 132/99 104/72  Pulse: 80 79 88 92  Resp: 16 18 18  (!) 22  Temp:  97.9 F (36.6 C) (!) 100.6 F (38.1 C) 98.6 F (37 C)  TempSrc:  Oral Oral  SpO2: 100% 92%  95%  Weight:      Height:        Intake/Output Summary (Last 24 hours) at 08/14/2018 1438 Last data filed at 08/14/2018 1056 Gross per 24 hour  Intake 1126.87 ml  Output -  Net 1126.87 ml   Filed Weights   08/13/18 1617  Weight: 80.3 kg   Body mass index is 34.57 kg/m. General:  Well nourished, well developed, in no acute distress HEENT: normal Lymph: no adenopathy Neck: no JVD Endocrine:  No thryomegaly Vascular: No carotid bruits; FA pulses 2+ bilaterally without bruits  Cardiac:  normal S1, S2; RRR; no murmur  Lungs:  Coarse lung sounds, scattered Rales Abd: soft, nontender, no hepatomegaly  Ext: no edema Musculoskeletal:  No deformities, BUE and BLE strength normal and equal Skin: warm and dry  Neuro:  CNs 2-12 intact, no focal abnormalities noted Psych:  Normal affect   EKG:  The EKG was personally reviewed and demonstrates:   Shows sinus bradycardia rate 58 bpm no significant ST-T wave changes  Telemetry:  Telemetry was personally reviewed and demonstrates:  Normal sinus rhythm  Relevant CV Studies: Echocardiogram pending  Laboratory Data:  Chemistry Recent Labs  Lab 08/12/18 1438 08/13/18 1629 08/14/18 0557  NA 131* 129* 129*  K 3.7 3.4* 3.5  CL 97* 98 97*  CO2 25 22  21*  GLUCOSE 122* 135* 122*  BUN 9 7* 5*  CREATININE 0.74 0.54 0.59  CALCIUM 9.1 8.9 8.9  GFRNONAA >60 >60 >60  GFRAA >60 >60 >60  ANIONGAP 9 9 11     Recent Labs  Lab 08/12/18 1438 08/13/18 1629  PROT 8.0 7.6  ALBUMIN 4.3 4.2  AST 33 27  ALT 26 23  ALKPHOS 81 81  BILITOT 0.6 0.7   Hematology Recent Labs  Lab 08/12/18 1438 08/13/18 1629  WBC 9.9 12.0*  RBC 4.50 4.56  HGB 14.0 14.2  HCT 39.6 40.5  MCV 88.1 88.7  MCH 31.2 31.1  MCHC 35.4 35.1  RDW 14.3 14.1  PLT 259 221   Cardiac Enzymes Recent Labs  Lab 08/12/18 1438 08/12/18 1720 08/13/18 1629  TROPONINI <0.03 <0.03 <0.03   No results for input(s): TROPIPOC in the last 168 hours.  BNP Recent Labs  Lab 08/12/18 1720  BNP 64.0    DDimer No results for input(s): DDIMER in the last 168 hours.  Radiology/Studies:  Dg Chest 2 View  Result Date: 08/12/2018 CLINICAL DATA:  Chest pain. EXAM: CHEST - 2 VIEW COMPARISON:  Radiographs of July 20, 2018. FINDINGS: The heart size and mediastinal contours are within normal limits. No pneumothorax is noted. Stable minimal bibasilar subsegmental atelectasis is noted with small pleural effusions. The visualized skeletal structures are unremarkable. IMPRESSION: Stable minimal bibasilar subsegmental atelectasis with minimal pleural effusions. Electronically Signed   By: Marijo Conception, M.D.   On: 08/12/2018 15:24   Ct Abdomen Pelvis W Contrast  Result Date: 08/12/2018 CLINICAL DATA:  Chest pain generalized abdomen pain EXAM: CT ABDOMEN AND PELVIS WITH CONTRAST TECHNIQUE: Multidetector CT imaging of the abdomen and pelvis was performed using the standard protocol following bolus administration of intravenous contrast. CONTRAST:  166mL ISOVUE-300 IOPAMIDOL (ISOVUE-300) INJECTION 61% COMPARISON:  Chest x-ray 08/12/2018 FINDINGS: Lower chest: Calcified granuloma in the right middle lobe. No acute consolidation or pleural effusion. Heart size upper normal. Coronary vascular  calcification. Small hiatal hernia. Hepatobiliary: Dilated gallbladder without calcified stone. No focal hepatic abnormality or biliary dilatation Pancreas: Unremarkable. No pancreatic  ductal dilatation or surrounding inflammatory changes. Spleen: Normal in size without focal abnormality. Adrenals/Urinary Tract: Adrenal glands are unremarkable. Kidneys are normal, without renal calculi, focal lesion, or hydronephrosis. Bladder is unremarkable. Stomach/Bowel: Stomach nonenlarged. No dilated small bowel. Colon diverticular disease without acute inflammatory change. Vascular/Lymphatic: Moderate severe aortic atherosclerosis. No aneurysm. No significantly enlarged lymph nodes. Reproductive: Uterus and bilateral adnexa are unremarkable. Other: Negative for free air or free fluid. Musculoskeletal: No fracture. Soft tissue and fat density deep to the pubic symphysis without adjacent bony destructive changes, likely benign. IMPRESSION: 1. Dilated gallbladder without calcified stones or other inflammatory change. If gallbladder disease is suspected, could correlate with sonography. 2. No definite CT evidence for acute intra-abdominal or pelvic abnormality. 3. Diffuse diverticular disease of the colon without acute inflammatory process. Electronically Signed   By: Donavan Foil M.D.   On: 08/12/2018 19:27   US Abdomen Limited Ruq  Result Date: 08/12/2018 CLINICAL DATA:  Right upper quadrant pain EXAM: ULTRASOUND ABDOMEN LIMITED RIGHT UPPER QUADRANT COMPARISON:  CT 08/12/2018 FINDINGS: Gallbladder: Sludge in multiple stones within the gallbladder. Slight increased wall thickness up to 4.3 mm. Negative sonographic Murphy. Common bile duct: Diameter: 4 mm Liver: No focal lesion identified. Within normal limits in parenchymal echogenicity. Portal vein is patent on color Doppler imaging with normal direction of blood flow towards the liver. IMPRESSION: 1. Cholelithiasis with mild wall thickening but negative sonographic  Murphy. Findings are equivocal for cholecystitis. Consider correlation with HIDA scan if suspect acute cholecystitis. 2. No biliary dilatation. Electronically Signed   By: Donavan Foil M.D.   On: 08/12/2018 21:46    Assessment and Plan:   1. Preoperative cardiovascular Acceptable risk for gallbladder surgery Chronic shortness of breath secondary to long smoking history 40 years, likely underlying COPD Denies any anginal symptoms, good exertional capacity Unable to exclude component of pulmonary hypertension as a contributor to her shortness of breath -Will decrease IV fluids  -Echocardiogram ordered to evaluate right heart pressures For any shortness of breath would give dose of IV Lasix Timing of echocardiogram should not delay surgery if needed  2. CAD Continue aspirin Lipitor metoprolol Stressed importance of smoking cessation Denies any symptoms concerning for angina No need for further ischemic workup at this time  3) smoker We have encouraged her to continue to work on weaning her cigarettes and smoking cessation. She will continue to work on this and does not want any assistance with chantix.  Husband also a smoker  4) shortness of breath Underlying COPD,  Echocardiogram ordered to evaluate right heart pressures  5) cholecystitis Managed by surgery Plan for cholecystectomy later today or tomorrow would try to minimize IV fluids   Total encounter time more than 110 minutes  Greater than 50% was spent in counseling and coordination of care with the patient   For questions or updates, please contact [facility] Please consult [email or web address] for contact info under Cardiology/STEMI.   Signed, Ida Rogue, MD  08/14/2018 2:38 PM

## 2018-08-14 NOTE — Op Note (Signed)
Hand Assisted Laparoscopic Cholecystectomy  Pre-operative Diagnosis: Acute Cholecystitis  Post-operative Diagnosis: same  Procedure: Hand Assisted Laparoscopic Cholecystectomy with drain placement  Surgeon: Caroleen Hamman, MD FACS  Anesthesia: Gen. with endotracheal tube   Findings: Gangranous Cholecystitis  Friable tissue due to severe inflammatory reaction   Estimated Blood Loss: 250cc         Drains: 15 FR blake         Specimens: Gallbladder           Complications: none   Procedure Details  The patient was seen again in the Holding Room. The benefits, complications, treatment options, and expected outcomes were discussed with the patient. The risks of bleeding, infection, recurrence of symptoms, failure to resolve symptoms, bile duct damage, bile duct leak, retained common bile duct stone, bowel injury, any of which could require further surgery and/or ERCP, stent, or papillotomy were reviewed with the patient. The likelihood of improving the patient's symptoms with return to their baseline status is good.  The patient and/or family concurred with the proposed plan, giving informed consent.  The patient was taken to Operating Room, identified as Kristi [name] and the procedure verified as Laparoscopic Cholecystectomy.  A Time Out was held and the above information confirmed.  Prior to the induction of general anesthesia, antibiotic prophylaxis was administered. VTE prophylaxis was in place. General endotracheal anesthesia was then administered and tolerated well. After the induction, the abdomen was prepped with Chloraprep and draped in the sterile fashion. The patient was positioned in the supine position.  Cut down technique was used to enter the abdominal cavity and a Hasson trochar was placed after two vicryl stitches were anchored to the fascia. Pneumoperitoneum was then created with CO2 and tolerated well without any adverse changes in the patient's vital signs.  Three  5-mm ports were placed in the right upper quadrant all under direct vision. All skin incisions  were infiltrated with a local anesthetic agent before making the incision and placing the trocars.  Extensive adhesions were encountered, those were lysed with scissors. One We had an adequate window for dissection The patient was positioned  in reverse Trendelenburg, tilted slightly to the patient's left.     The gallbladder was initially not identified as it was covered by omentum, she had distorted anatomy with a Phlegmon like mass.  At this point it was obvious that this case was not going to be done purely laparoscopic. THe periumbilical incision was extended and Gelport was placed. I placed my hand and performed finger dissection of the omentum form the Gallbladder. THe gallbladder was puncture to allow good dissection. The fundus grasped and retracted cephalad. Adhesions were lysed bluntly. The infundibulum was grasped and retracted laterally, exposing the peritoneum overlying the triangle of Calot. This was then divided and exposed in a blunt fashion. An extended critical view of the cystic duct and cystic artery was obtained.  The cystic duct was clearly identified and bluntly dissected.   Artery and duct were double clipped and divided.  The gallbladder was taken from the gallbladder fossa in a retrograde fashion with the electrocautery. The gallbladder was removed and placed in an Endocatch bag. The liver bed was irrigated and inspected. Hemostasis was achieved with the electrocautery. Copious irrigation was utilized and was repeatedly aspirated until clear.  The gallbladder and Endocatch sac were then removed through a port site.    Inspection of the right upper quadrant was performed. No bleeding, bile duct injury or leak, or bowel injury  was noted. Pneumoperitoneum was released.  15 blake drain was placed in the RUQ. 0 PDS was used to close the fascia in a running fashion. Liposomal marcaine was  injected for post op analgesia. Skin was closed with staples.    The patient was then extubated and brought to the recovery room in stable condition. Sponge, lap, and needle counts were correct at closure and at the conclusion of the case.               Caroleen Hamman, MD, FACS

## 2018-08-14 NOTE — Anesthesia Procedure Notes (Signed)
Procedure Name: Intubation Date/Time: 08/14/2018 3:33 PM Performed by: Chanetta Marshall, CRNA Pre-anesthesia Checklist: Patient identified, Emergency Drugs available, Suction available and Patient being monitored Patient Re-evaluated:Patient Re-evaluated prior to induction Oxygen Delivery Method: Circle system utilized, Simple face mask, Non-rebreather mask and Nasal cannula Preoxygenation: Pre-oxygenation with 100% oxygen Induction Type: IV induction Ventilation: Mask ventilation without difficulty Laryngoscope Size: Mac and 3 Grade View: Grade II Tube type: Oral Number of attempts: 1 Placement Confirmation: ETT inserted through vocal cords under direct vision,  positive ETCO2,  CO2 detector and breath sounds checked- equal and bilateral Secured at: 21 cm Tube secured with: Tape Dental Injury: Teeth and Oropharynx as per pre-operative assessment

## 2018-08-14 NOTE — Transfer of Care (Signed)
Immediate Anesthesia Transfer of Care Note  Patient: Kristi [name]  Procedure(s) Performed: LAPAROSCOPIC CHOLECYSTECTOMY (N/A )  Patient Location: PACU  Anesthesia Type:General  Level of Consciousness: awake  Airway & Oxygen Therapy: Patient Spontanous Breathing and Patient connected to nasal cannula oxygen  Post-op Assessment: Report given to RN and Post -op Vital signs reviewed and stable  Post vital signs: Reviewed and stable  Last Vitals:  Vitals Value Taken Time  BP 88/65 08/14/2018  5:17 PM  Temp    Pulse 88 08/14/2018  5:17 PM  Resp 22 08/14/2018  5:17 PM  SpO2 96 % 08/14/2018  5:17 PM  Vitals shown include unvalidated device data.  Last Pain:  Vitals:   08/14/18 1300  TempSrc:   PainSc: 6       Patients Stated Pain Goal: 0 (52/84/13 2440)  Complications: No apparent anesthesia complications

## 2018-08-14 NOTE — Anesthesia Post-op Follow-up Note (Signed)
Anesthesia QCDR form completed.        

## 2018-08-14 NOTE — Care Management Obs Status (Signed)
[place] NOTIFICATION   Patient Details  Name: CHYAN CARNERO MRN: [id number] Date of Birth: [birth year]   Medicare Observation Status Notification Given:  Yes    Laydon Martis A Angell Pincock, RN 08/14/2018, 11:22 AM

## 2018-08-15 ENCOUNTER — Observation Stay (HOSPITAL_COMMUNITY)
Admit: 2018-08-15 | Discharge: 2018-08-15 | Disposition: A | Discharge status: Home / Self Care | Payer: Medicare Other | Attending: Surgery | Admitting: Surgery

## 2018-08-15 ENCOUNTER — Encounter: Payer: Self-pay | Admitting: Surgery

## 2018-08-15 DIAGNOSIS — K82A1 Gangrene of gallbladder in cholecystitis: Secondary | ICD-10-CM | POA: Diagnosis present

## 2018-08-15 DIAGNOSIS — K810 Acute cholecystitis: Secondary | ICD-10-CM | POA: Diagnosis present

## 2018-08-15 DIAGNOSIS — Z955 Presence of coronary angioplasty implant and graft: Secondary | ICD-10-CM | POA: Diagnosis not present

## 2018-08-15 DIAGNOSIS — I10 Essential (primary) hypertension: Secondary | ICD-10-CM | POA: Diagnosis present

## 2018-08-15 DIAGNOSIS — I2510 Atherosclerotic heart disease of native coronary artery without angina pectoris: Secondary | ICD-10-CM | POA: Diagnosis present

## 2018-08-15 DIAGNOSIS — J449 Chronic obstructive pulmonary disease, unspecified: Secondary | ICD-10-CM | POA: Diagnosis present

## 2018-08-15 DIAGNOSIS — Z800 Family history of malignant neoplasm of digestive organs: Secondary | ICD-10-CM | POA: Diagnosis not present

## 2018-08-15 DIAGNOSIS — Z79899 Other long term (current) drug therapy: Secondary | ICD-10-CM | POA: Diagnosis not present

## 2018-08-15 DIAGNOSIS — R0602 Shortness of breath: Secondary | ICD-10-CM

## 2018-08-15 DIAGNOSIS — G8929 Other chronic pain: Secondary | ICD-10-CM | POA: Diagnosis present

## 2018-08-15 DIAGNOSIS — F17210 Nicotine dependence, cigarettes, uncomplicated: Secondary | ICD-10-CM | POA: Diagnosis present

## 2018-08-15 DIAGNOSIS — E669 Obesity, unspecified: Secondary | ICD-10-CM | POA: Diagnosis present

## 2018-08-15 DIAGNOSIS — K819 Cholecystitis, unspecified: Secondary | ICD-10-CM | POA: Diagnosis not present

## 2018-08-15 DIAGNOSIS — Z6834 Body mass index (BMI) 34.0-34.9, adult: Secondary | ICD-10-CM | POA: Diagnosis not present

## 2018-08-15 DIAGNOSIS — I252 Old myocardial infarction: Secondary | ICD-10-CM | POA: Diagnosis not present

## 2018-08-15 DIAGNOSIS — K589 Irritable bowel syndrome without diarrhea: Secondary | ICD-10-CM | POA: Diagnosis present

## 2018-08-15 DIAGNOSIS — E785 Hyperlipidemia, unspecified: Secondary | ICD-10-CM | POA: Diagnosis present

## 2018-08-15 DIAGNOSIS — Z7982 Long term (current) use of aspirin: Secondary | ICD-10-CM | POA: Diagnosis not present

## 2018-08-15 LAB — CBC
HCT: 37.2 % (ref 35.0–47.0)
HEMOGLOBIN: 12.9 g/dL (ref 12.0–16.0)
MCH: 30.4 pg (ref 26.0–34.0)
MCHC: 34.6 g/dL (ref 32.0–36.0)
MCV: 87.8 fL (ref 80.0–100.0)
Platelets: 189 10*3/uL (ref 150–440)
RBC: 4.24 MIL/uL (ref 3.80–5.20)
RDW: 14.4 % (ref 11.5–14.5)
WBC: 16.8 10*3/uL — ABNORMAL HIGH (ref 3.6–11.0)

## 2018-08-15 LAB — COMPREHENSIVE METABOLIC PANEL
ALBUMIN: 3 g/dL — AB (ref 3.5–5.0)
ALK PHOS: 74 U/L (ref 38–126)
ALT: 61 U/L — AB (ref 0–44)
AST: 72 U/L — ABNORMAL HIGH (ref 15–41)
Anion gap: 9 (ref 5–15)
BILIRUBIN TOTAL: 1.1 mg/dL (ref 0.3–1.2)
BUN: 14 mg/dL (ref 8–23)
CALCIUM: 8.3 mg/dL — AB (ref 8.9–10.3)
CO2: 22 mmol/L (ref 22–32)
CREATININE: 0.63 mg/dL (ref 0.44–1.00)
Chloride: 99 mmol/L (ref 98–111)
GFR calc Af Amer: >60 mL/min (ref >60)
GFR calc non Af Amer: >60 mL/min (ref >60)
GLUCOSE: 117 mg/dL — AB (ref 70–99)
Potassium: 4.7 mmol/L (ref 3.5–5.1)
SODIUM: 130 mmol/L — AB (ref 135–145)
TOTAL PROTEIN: 6.4 g/dL — AB (ref 6.5–8.1)

## 2018-08-15 LAB — ECHOCARDIOGRAM COMPLETE
Height: 60 in
Weight: 2832 oz

## 2018-08-15 MED ORDER — HYDROCODONE-ACETAMINOPHEN 5-325 MG PO TABS: 1.0000 | ORAL_TABLET | Freq: Four times a day (QID) | ORAL | 0 refills | Status: DC | PRN

## 2018-08-15 MED ORDER — METRONIDAZOLE 500 MG PO TABS: 500.0000 mg | ORAL_TABLET | Freq: Three times a day (TID) | ORAL | 0 refills | Status: AC

## 2018-08-15 MED ORDER — CIPROFLOXACIN HCL 500 MG PO TABS: 500.0000 mg | ORAL_TABLET | Freq: Two times a day (BID) | ORAL | 0 refills | Status: AC

## 2018-08-15 NOTE — Discharge Planning (Signed)
Patient IV removed.  RN assessment and VS revealed stability for DC to go home.  Drain to remain in place, per PA instructions, and will be re-assessed at FU in office (Sept 3 1000 - Dr. Rosana Hoes.)  DC instructions printed, explained and educated.  Informed of suggested FU appt and appt made.  Scripts printed, signed and given.  Once ready, will be wheeled to front and family transporting home via car.

## 2018-08-15 NOTE — Progress Notes (Signed)
Pharmacy Antibiotic Note  Kristi [name] is a 72 y.o. female admitted on 08/13/2018 with cholecystitis.  Pharmacy has been consulted for Zosyn dosing for intraabdominal infection.  Plan: Zosyn 3.375g IV q8h (4 hour infusion).  Height: 5' (152.4 cm) Weight: 177 lb (80.3 kg) IBW/kg (Calculated) : 45.5  Temp (24hrs), Avg:98.3 F (36.8 C), Min:97.3 F (36.3 C), Max:98.8 F (37.1 C)  Recent Labs  Lab 08/12/18 1438 08/13/18 1629 08/14/18 0557 08/15/18 0535  WBC 9.9 12.0*  --  16.8*  CREATININE 0.74 0.54 0.59 0.63    Estimated Creatinine Clearance: 60.5 mL/min (by C-G formula based on SCr of 0.63 mg/dL).    No Known Allergies  Antimicrobials this admission: Ceftriaxone 8/24 >> 8/25 Zosyn 8/25 >>   Thank you for allowing pharmacy to be a part of this patient's care.   Rayna Sexton, PharmD, BCPS Clinical Pharmacist 08/15/2018 7:59 AM

## 2018-08-15 NOTE — Discharge Summary (Signed)
Discharge Summary  Patient ID: Kristi [name] MRN: [id number] DOB/AGE: [birth year] 72 y.o.  Admit date: 08/13/2018 Discharge date: 08/15/2018  Discharge Diagnoses Cholecystitis  Consultants None  Procedures Laparoscopic Cholecystectomy on 08/25 with Dr. Caroleen Hamman, MD  HPI:  Kristi [name] is a 72 y.o. female who presented to the ED on 08/24 with abdominal pain.She had been seen the day prior and Dx with cholelithiasis. The pain continued through the night which prompted her return to the ED. She noted severe pain in her RUQ with associated nausea and emesis. She denied any fever, chills, or jaundice.   While in the ED US revealed gallstones with pericholecystic fluid and wall thickening, and her common bile duct was within normal limits.   Hospital Course: On 08/25, she was admitted to general surgery and underwent laparoscopic cholecystectomy with Dr. Caroleen Hamman, MD. On 08/26, she had tolerated a full diet, her pain was controlled, and she was able to ambulate prior to discharge home.   She will follow up in 1 week with general surgery.     Allergies as of 08/15/2018   No Known Allergies     Medication List    STOP taking these medications   cephALEXin 500 MG capsule Commonly known as:  KEFLEX   ondansetron 4 MG disintegrating tablet Commonly known as:  ZOFRAN-ODT   oxyCODONE-acetaminophen 5-325 MG tablet Commonly known as:  PERCOCET/ROXICET     TAKE these medications   aspirin 81 MG chewable tablet Chew 1 tablet (81 mg total) by mouth daily.   atorvastatin 80 MG tablet Commonly known as:  LIPITOR Take 0.5 tablets (40 mg total) by mouth daily at 6 PM.   CALCIUM PO Take 1 tablet by mouth daily.   cetirizine 10 MG tablet Commonly known as:  ZYRTEC Take 10 mg by mouth daily.   ciprofloxacin 500 MG tablet Commonly known as:  CIPRO Take 1 tablet (500 mg total) by mouth 2 (two) times daily for 10 days.   HYDROcodone-acetaminophen 5-325 MG tablet Commonly  known as:  NORCO/VICODIN Take 1-2 tablets by mouth every 6 (six) hours as needed for moderate pain.   metoprolol tartrate 25 MG tablet Commonly known as:  LOPRESSOR Take 12.5 mg by mouth 2 (two) times daily.   metroNIDAZOLE 500 MG tablet Commonly known as:  FLAGYL Take 1 tablet (500 mg total) by mouth 3 (three) times daily for 10 days.   nitroGLYCERIN 0.4 MG SL tablet Commonly known as:  NITROSTAT Place 1 tablet (0.4 mg total) under the tongue every 5 (five) minutes x 3 doses as needed for chest pain.   VITAMIN B COMPLEX PO Take 1 tablet by mouth daily.   Vitamin D 2000 units Caps Take 1 capsule (2,000 Units total) by mouth daily.        Follow-up Information    P[place], [place] F, MD. Schedule an appointment as soon as possible for a visit in 1 week(s).   Specialty:  General Surgery Contact information: [address] [place] [address] [phone]           Signed: Edison Simon , PA-C  [facility]  08/15/2018, 2:22 PM [phone] M-F: 7am - 4pm

## 2018-08-15 NOTE — Progress Notes (Signed)
*  PRELIMINARY RESULTS* Echocardiogram 2D Echocardiogram has been performed.  Kristi [name] 08/15/2018, 9:10 AM

## 2018-08-15 NOTE — Progress Notes (Signed)
Progress Note  Patient Name: Kristi [name] [facility] Date of Encounter: 08/15/2018  Primary Cardiologist: Dr. Terrence Dupont  Subjective   She underwent cholecystectomy without complications.  She denies any chest pain.  She had mild shortness of breath with exertion and walking in the hallway.  Inpatient Medications    Scheduled Meds: . metoprolol tartrate  12.5 mg Oral BID  . potassium chloride  40 mEq Oral BID   Continuous Infusions: . lactated ringers 40 mL/hr at 08/15/18 0609  . piperacillin-tazobactam (ZOSYN)  IV 3.375 g (08/15/18 1322)   PRN Meds: acetaminophen **OR** acetaminophen, alum & mag hydroxide-simeth, ketorolac, metoprolol tartrate, morphine injection, nitroGLYCERIN, ondansetron **OR** ondansetron (ZOFRAN) IV, oxyCODONE, prochlorperazine **OR** prochlorperazine   Vital Signs    Vitals:   08/15/18 0034 08/15/18 0451 08/15/18 1040 08/15/18 1041  BP: 120/76 130/90 110/78 110/78  Pulse: 86 88 93 95  Resp: 20 20 18    Temp: 98.8 F (37.1 C) 99 F (37.2 C) 98.4 F (36.9 C)   TempSrc: Oral Oral    SpO2: 94% 92% 95%   Weight:      Height:        Intake/Output Summary (Last 24 hours) at 08/15/2018 1514 Last data filed at 08/15/2018 0900 Gross per 24 hour  Intake 1772.34 ml  Output 1040 ml  Net 732.34 ml   Filed Weights   08/13/18 1617  Weight: 80.3 kg    Telemetry      ECG      Physical Exam   GEN: No acute distress.   Neck: No JVD Cardiac: RRR, no murmurs, rubs, or gallops.  Respiratory: Clear to auscultation bilaterally. GI: Soft, nontender, non-distended  MS: No edema; No deformity. Neuro:  Nonfocal  Psych: Normal affect   Labs    Chemistry Recent Labs  Lab 08/12/18 1438 08/13/18 1629 08/14/18 0557 08/15/18 0535  NA 131* 129* 129* 130*  K 3.7 3.4* 3.5 4.7  CL 97* 98 97* 99  CO2 25 22 21* 22  GLUCOSE 122* 135* 122* 117*  BUN 9 7* 5* 14  CREATININE 0.74 0.54 0.59 0.63  CALCIUM 9.1 8.9 8.9 8.3*  PROT 8.0 7.6  --  6.4*  ALBUMIN 4.3  4.2  --  3.0*  AST 33 27  --  72*  ALT 26 23  --  61*  ALKPHOS 81 81  --  74  BILITOT 0.6 0.7  --  1.1  GFRNONAA >60 >60 >60 >60  GFRAA >60 >60 >60 >60  ANIONGAP 9 9 11 9      Hematology Recent Labs  Lab 08/12/18 1438 08/13/18 1629 08/15/18 0535  WBC 9.9 12.0* 16.8*  RBC 4.50 4.56 4.24  HGB 14.0 14.2 12.9  HCT 39.6 40.5 37.2  MCV 88.1 88.7 87.8  MCH 31.2 31.1 30.4  MCHC 35.4 35.1 34.6  RDW 14.3 14.1 14.4  PLT 259 221 189    Cardiac Enzymes Recent Labs  Lab 08/12/18 1438 08/12/18 1720 08/13/18 1629  TROPONINI <0.03 <0.03 <0.03   No results for input(s): TROPIPOC in the last 168 hours.   BNP Recent Labs  Lab 08/12/18 1720  BNP 64.0     DDimer No results for input(s): DDIMER in the last 168 hours.   Radiology    No results found.  Cardiac Studies   I personally reviewed her echocardiogram which showed normal LV systolic function and grade 1 diastolic dysfunction.  Patient Profile     72 y.o. 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  Assessment & Plan    1.  Acute cholecystitis: Status post successful laparoscopic cholecystectomy without complications.  2.  Coronary artery disease: I agree with resuming low-dose aspirin, atorvastatin and metoprolol.   [facility] will sign off.   Medication Recommendations: Resume home dose aspirin, metoprolol and atorvastatin Other recommendations (labs, testing, etc):   Follow up as an outpatient: Recommend follow-up with Dr. Terrence Dupont as previously scheduled.  For questions or updates, please contact [place] Please consult [email or web address] for contact info under Cardiology/STEMI.      Signed, Kathlyn Sacramento, MD  08/15/2018, 3:14 PM

## 2018-08-15 NOTE — Progress Notes (Signed)
[facility] Progress Note  1 Day Post-Op  Subjective: Pt notes improvement in pain with mild soreness. She has been tolerating a liquid diet without difficulty. No nausea, emesis, fever, or chills. +flatus.   Objective: Vital signs in last 24 hours: Temp:  [97.3 F (36.3 C)-98.8 F (37.1 C)] 98.4 F (36.9 C) (08/26 1040) Pulse Rate:  [79-95] 95 (08/26 1041) Resp:  [16-22] 18 (08/26 1040) BP: (88-135)/(61-86) 110/78 (08/26 1041) SpO2:  [94 %-98 %] 95 % (08/26 1040) Last BM Date: 08/06/18  Intake/Output from previous day: 08/25 0701 - 08/26 0700 In: 1969.4 [I.V.:1819.1; IV Piggyback:135.2] Out: 860 [Urine:500; Drains:110; Blood:250] Intake/Output this shift: Total I/O In: 240 [P.O.:240] Out: -   PE: Gen:  Alert, NAD, pleasant Card:  Regular rate and rhythm, pedal pulses 2+ BL Pulm:  Normal effort, clear to auscultation bilaterally Abd: Soft, minimal diffuse tenderness, non-distended, bowel sounds present in all 4 quadrants, no HSM, incisions C/D/I Skin: warm and dry, no rashes  Psych: A&Ox3   Lab Results:  Recent Labs    08/13/18 1629 08/15/18 0535  WBC 12.0* 16.8*  HGB 14.2 12.9  HCT 40.5 37.2  PLT 221 189   BMET Recent Labs    08/14/18 0557 08/15/18 0535  NA 129* 130*  K 3.5 4.7  CL 97* 99  CO2 21* 22  GLUCOSE 122* 117*  BUN 5* 14  CREATININE 0.59 0.63  CALCIUM 8.9 8.3*   PT/INR No results for input(s): LABPROT, INR in the last 72 hours. CMP     Component Value Date/Time   NA 130 (L) 08/15/2018 0535   NA 139 07/20/2018 0829   K 4.7 08/15/2018 0535   CL 99 08/15/2018 0535   CO2 22 08/15/2018 0535   GLUCOSE 117 (H) 08/15/2018 0535   BUN 14 08/15/2018 0535   BUN 10 07/20/2018 0829   CREATININE 0.63 08/15/2018 0535   CALCIUM 8.3 (L) 08/15/2018 0535   PROT 6.4 (L) 08/15/2018 0535   PROT 7.7 07/20/2018 0829   ALBUMIN 3.0 (L) 08/15/2018 0535   ALBUMIN 4.5 07/20/2018 0829   AST 72 (H) 08/15/2018 0535   ALT 61 (H) 08/15/2018 0535    ALKPHOS 74 08/15/2018 0535   BILITOT 1.1 08/15/2018 0535   BILITOT 0.4 07/20/2018 0829   GFRNONAA >60 08/15/2018 0535   GFRAA >60 08/15/2018 0535   Lipase     Component Value Date/Time   LIPASE 28 08/13/2018 1629       Studies/Results: No results found.  Anti-infectives: Anti-infectives (From admission, onward)   Start     Dose/Rate Route Frequency Ordered Stop   08/14/18 1845  piperacillin-tazobactam (ZOSYN) IVPB 3.375 g     3.375 g 12.5 mL/hr over 240 Minutes Intravenous Every 8 hours 08/14/18 1830     08/14/18 1830  piperacillin-tazobactam (ZOSYN) IVPB 3.375 g  Status:  Discontinued     3.375 g 100 mL/hr over 30 Minutes Intravenous Every 8 hours 08/14/18 1826 08/14/18 1829   08/13/18 2200  cefTRIAXone (ROCEPHIN) 2 g in sodium chloride 0.9 % 100 mL IVPB  Status:  Discontinued     2 g 200 mL/hr over 30 Minutes Intravenous Every 24 hours 08/13/18 2107 08/14/18 1826       Assessment/Plan  Cholecystitis - Managing well post-operatively. POD1 - Will advanced her diet to regular full diet, and she how she does today. Likely will go home this afternoon.  - Continue ABx in the interim, mobilize   LOS: 0 days    Alroy Dust  Freda Jackson [facility] 08/15/2018, 10:58 AM

## 2018-08-16 ENCOUNTER — Institutional Professional Consult (permissible substitution): Payer: Medicare Other | Admitting: Internal Medicine

## 2018-08-16 LAB — SURGICAL PATHOLOGY

## 2018-08-16 NOTE — Anesthesia Postprocedure Evaluation (Signed)
Anesthesia Post Note  Patient: Kristi [name] [facility]  Procedure(s) Performed: LAPAROSCOPIC CHOLECYSTECTOMY (N/A )  Patient location during evaluation: PACU Anesthesia Type: General Level of consciousness: awake and alert Pain management: pain level controlled Vital Signs Assessment: post-procedure vital signs reviewed and stable Respiratory status: spontaneous breathing, nonlabored ventilation, respiratory function stable and patient connected to nasal cannula oxygen Cardiovascular status: blood pressure returned to baseline and stable Postop Assessment: no apparent nausea or vomiting Anesthetic complications: no     Last Vitals:  Vitals:   08/15/18 1040 08/15/18 1041  BP: 110/78 110/78  Pulse: 93 95  Resp: 18   Temp: 36.9 C   SpO2: 95%     Last Pain:  Vitals:   08/15/18 1102  TempSrc:   PainSc: 3                  Molli Barrows

## 2018-08-17 ENCOUNTER — Telehealth: Payer: Self-pay

## 2018-08-17 ENCOUNTER — Encounter: Payer: Self-pay | Admitting: Surgery

## 2018-08-17 ENCOUNTER — Ambulatory Visit (INDEPENDENT_AMBULATORY_CARE_PROVIDER_SITE_OTHER): Payer: Medicare Other | Admitting: Surgery

## 2018-08-17 VITALS — BP 136/94 | HR 78 | Temp 97.9°F | Ht 65.0 in | Wt 173.0 lb

## 2018-08-17 DIAGNOSIS — Z09 Encounter for follow-up examination after completed treatment for conditions other than malignant neoplasm: Secondary | ICD-10-CM

## 2018-08-17 MED ORDER — FUROSEMIDE 20 MG PO TABS: 20.0000 mg | ORAL_TABLET | Freq: Two times a day (BID) | ORAL | 0 refills | Status: DC

## 2018-08-17 NOTE — Patient Instructions (Signed)
Rx sent to pharmacy, Miralax daily , Return in ine week

## 2018-08-17 NOTE — Progress Notes (Signed)
S/p HALS cholecystectomy POD $ 3 Doing well but call due to pain No fevers, + constipation Some SOB, less 30cc day from drain serous  PE NAD Chest: decrease bs, crackles Abd: soft, appropriate tenderness. No peritonitis, no infection . JP serosang, removed. No peritonitis  A/p Doing well Post op pain likely from constipation, JP and from liposomal marcaine wearing off No surgical complications RTC 1 week, if pain sxs worsens we will need RUQ u/s and labs

## 2018-08-17 NOTE — Telephone Encounter (Signed)
Patient states she is in a tremendous amount of pain. Last 2 days she has had fever and chills. Nauseated. Patient added to schedule today.

## 2018-08-23 ENCOUNTER — Encounter: Payer: Self-pay | Admitting: Surgery

## 2018-08-23 ENCOUNTER — Ambulatory Visit (INDEPENDENT_AMBULATORY_CARE_PROVIDER_SITE_OTHER): Payer: Medicare Other | Admitting: Surgery

## 2018-08-23 VITALS — BP 115/77 | HR 97 | Temp 97.7°F | Wt 169.0 lb

## 2018-08-23 DIAGNOSIS — Z4889 Encounter for other specified surgical aftercare: Secondary | ICD-10-CM

## 2018-08-23 NOTE — Patient Instructions (Addendum)
Please drink plenty of water.  At this time you can shower but you can not submerge in a tub, shower, beach or lake.  Please take Colace (stool softener) one tablet once a day.  Please eat plenty of fiber to help you with your constipation.  Please try drinking ENSURES daily to help you with your calory intake.  GENERAL POST-OPERATIVE PATIENT INSTRUCTIONS   WOUND CARE INSTRUCTIONS:  Keep a dry clean dressing on the wound if there is drainage. The initial bandage may be removed after 24 hours.  Once the wound has quit draining you may leave it open to air.  If clothing rubs against the wound or causes irritation and the wound is not draining you may cover it with a dry dressing during the daytime.  Try to keep the wound dry and avoid ointments on the wound unless directed to do so.  If the wound becomes bright red and painful or starts to drain infected material that is not clear, please contact your physician immediately.  If the wound is mildly pink and has a thick firm ridge underneath it, this is normal, and is referred to as a healing ridge.  This will resolve over the next 4-6 weeks.  BATHING: You may shower if you have been informed of this by your surgeon. However, Please do not submerge in a tub, hot tub, or pool until incisions are completely sealed or have been told by your surgeon that you may do so.  DIET:  You may eat any foods that you can tolerate.  It is a good idea to eat a high fiber diet and take in plenty of fluids to prevent constipation.  If you do become constipated you may want to take a mild laxative or take ducolax tablets on a daily basis until your bowel habits are regular.  Constipation can be very uncomfortable, along with straining, after recent surgery.  ACTIVITY:  You are encouraged to cough and deep breath or use your incentive spirometer if you were given one, every 15-30 minutes when awake.  This will help prevent respiratory complications and low grade fevers  post-operatively if you had a general anesthetic.  You may want to hug a pillow when coughing and sneezing to add additional support to the surgical area, if you had abdominal or chest surgery, which will decrease pain during these times.  You are encouraged to walk and engage in light activity for the next two weeks.  You should not lift more than 20 pounds, until 10/05/2018 as it could put you at increased risk for complications.  Twenty pounds is roughly equivalent to a plastic bag of groceries. At that time- Listen to your body when lifting, if you have pain when lifting, stop and then try again in a few days. Soreness after doing exercises or activities of daily living is normal as you get back in to your normal routine.  MEDICATIONS:  Try to take narcotic medications and anti-inflammatory medications, such as tylenol, ibuprofen, naprosyn, etc., with food.  This will minimize stomach upset from the medication.  Should you develop nausea and vomiting from the pain medication, or develop a rash, please discontinue the medication and contact your physician.  You should not drive, make important decisions, or operate machinery when taking narcotic pain medication.  SUNBLOCK Use sun block to incision area over the next year if this area will be exposed to sun. This helps decrease scarring and will allow you avoid a permanent darkened area over  your incision.  QUESTIONS:  Please feel free to call our office if you have any questions, and we will be glad to assist you. [phone]

## 2018-08-23 NOTE — Progress Notes (Signed)
Surgical Clinic Progress/Follow-up Note   HPI:  72 y.o. Female presents to clinic for post-op follow-up just over 1 week s/p hand-assisted laparoscopic cholecystectomy (Pabon, 08/14/2018). Patient reports complete resolution of pre-operative pain, while her post-operative pain particularly at her hand port site has slowly been improving along with improved constipation after she took Miralax. While her appetite has been slow to improve, she has been tolerating regular diet with +flatus and more normal BM's, denies N/V, fever/chills, CP, or SOB.  Review of Systems:  Constitutional: denies fever/chills  Respiratory: denies shortness of breath, wheezing  Cardiovascular: denies chest pain, palpitations  Gastrointestinal: abdominal pain, N/V, and bowel function as per interval history Skin: Denies any other rashes or skin discolorations except post-surgical wounds as per interval history  Vital Signs:  BP 115/77   Pulse 97   Temp 97.7 F (36.5 C) (Oral)   Wt 169 lb (76.7 kg)   BMI 28.12 kg/m    Physical Exam:  Constitutional:  -- Normal body habitus  -- Awake, alert, and oriented x3  Pulmonary:  -- No crackles -- Equal breath sounds bilaterally -- Breathing non-labored at rest Cardiovascular:  -- S1, S2 present  -- No pericardial rubs  Gastrointestinal:  -- Soft and non-distended with mild peri-incisional tenderness to palpation, no guarding/rebound tenderness -- Post-surgical incisions all well-approximated without any peri-incisional erythema or drainage -- No abdominal masses appreciated, pulsatile or otherwise  Musculoskeletal / Integumentary:  -- Wounds or skin discoloration: None appreciated except post-surgical incisions as described above (GI) -- Extremities: B/L UE and LE FROM, hands and feet warm, no edema   Assessment:  72 y.o. yo Female with a problem list including...  Patient Active Problem List   Diagnosis Date Noted  . Cholecystitis 08/13/2018  . COPD with  exacerbation ([place]) 08/11/2018  . Osteopenia determined by x-ray 05/19/2018  . Prediabetes 02/08/2018  . Hx of fracture of radius 08/18/2016  . Vitamin D deficiency 01/07/2016  . Hyperlipidemia 01/02/2016  . Hypertension 01/02/2016  . Chronic pain 01/02/2016  . Obesity (BMI 30.0-34.9) 01/02/2016  . CAD (coronary artery disease) 01/02/2016  . IBS (irritable bowel syndrome) 01/02/2016  . FH: colon cancer 01/02/2016  . DDD (degenerative disc disease), cervical 01/02/2016  . Primary osteoarthritis of left knee 01/02/2016  . Smoker 01/02/2016    presents to clinic for post-op follow-up evaluation, doing better since initial early post-surgical follow-up last week just 3 days following surgery, now just over 1 week s/p hand-assisted laparoscopic cholecystectomy (Pabon, 08/14/2018) for acute cholecystitis.  Plan:              - advance diet as tolerated  - incisional staples removed, steri-strips applied             - okay in 1 week to submerge incisions under water (baths, swimming) prn             - in 1 week, gradually resume all activities without restrictions over next 2 weeks             - apply sunblock particularly to incisions with sun exposure to reduce pigmentation of scars             - return to clinic in 1 week, instructed to call office if any questions or concerns  All of the above recommendations were discussed with the patient, and all of patient's questions were answered to her expressed satisfaction.  -- Marilynne Drivers Rosana Hoes, MD, [place]: [facility]  for exceptional care. Office: [phone]

## 2018-08-24 ENCOUNTER — Telehealth: Payer: Self-pay | Admitting: Surgery

## 2018-08-24 ENCOUNTER — Encounter: Payer: Self-pay | Admitting: Surgery

## 2018-08-24 NOTE — Telephone Encounter (Signed)
Patient called in and wanted to let Dr Rosana Hoes know her legs are swelling she for got to tell him on Tuesday 08-23-18. Dr Dahlia Byes did her lap choley on 08-14-18 and after surgery her legs where swollen and she says he gave her something to help.

## 2018-08-25 NOTE — Telephone Encounter (Signed)
Called patient back to let her know that I was able to speak with Dr. Rosana Hoes in reference to her legs edema. Dr. Rosana Hoes recommended for her to see her PCP in reference to this. Patient understood and had no further questions.

## 2018-08-29 ENCOUNTER — Encounter: Payer: Self-pay | Admitting: Internal Medicine

## 2018-08-29 ENCOUNTER — Ambulatory Visit (INDEPENDENT_AMBULATORY_CARE_PROVIDER_SITE_OTHER): Payer: Medicare Other | Admitting: Internal Medicine

## 2018-08-29 ENCOUNTER — Telehealth: Payer: Self-pay | Admitting: Internal Medicine

## 2018-08-29 VITALS — BP 116/70 | HR 61 | Ht 65.0 in | Wt 172.0 lb

## 2018-08-29 DIAGNOSIS — I25119 Atherosclerotic heart disease of native coronary artery with unspecified angina pectoris: Secondary | ICD-10-CM

## 2018-08-29 DIAGNOSIS — T8131XA Disruption of external operation (surgical) wound, not elsewhere classified, initial encounter: Secondary | ICD-10-CM | POA: Diagnosis not present

## 2018-08-29 DIAGNOSIS — R600 Localized edema: Secondary | ICD-10-CM | POA: Diagnosis not present

## 2018-08-29 MED ORDER — TRIAMTERENE-HCTZ 37.5-25 MG PO CAPS: 1.0000 | ORAL_CAPSULE | Freq: Every day | ORAL | 0 refills | Status: DC | PRN

## 2018-08-29 MED ORDER — SILVER SULFADIAZINE 1 % EX CREA: 1.0000 "application " | TOPICAL_CREAM | Freq: Every day | CUTANEOUS | 0 refills | Status: DC

## 2018-08-29 NOTE — Telephone Encounter (Signed)
Swelling in the feet, had surgery 2 weeks ago. Pt was given water pills during surgery f/u appt, but still has swelling. Was told by surgeons office that the swelling in not a side effect from surgery so she should f/u with PCP. Please Advise.

## 2018-08-29 NOTE — Progress Notes (Signed)
Date:  08/29/2018   Name:  Kristi [name] [facility]   DOB:  [birth year]   MRN:  [id number]   Chief Complaint: Edema (Swelling in both feet. Started out as left foot but now both. Started last Friday. )  Edema - having mild swelling in both feet since her surgery.  She has not been nearly as active, spending more time sitting with feet down.  In the morning the edema has resolved but increases throughout the day.  No pain, redness, heat or injury.  Lasix helped. Labs after surgery showed low albumin, low sodium.  Wound check - she thinks there is a raw area from her surgery.  No odor but it feels moist.  She has follow up with surgeon in 10 days.  She is not using anything topically.   Review of Systems  Constitutional: Positive for fatigue. Negative for chills and fever.  Respiratory: Positive for cough and shortness of breath. Negative for chest tightness.   Cardiovascular: Positive for leg swelling. Negative for chest pain and palpitations.  Gastrointestinal: Negative for abdominal pain.  Skin: Positive for wound.  Neurological: Negative for dizziness and headaches.  Psychiatric/Behavioral: Negative for dysphoric mood and sleep disturbance.    Patient Active Problem List   Diagnosis Date Noted  . Cholecystitis 08/13/2018  . COPD with exacerbation ([place]) 08/11/2018  . Osteopenia determined by x-ray 05/19/2018  . Prediabetes 02/08/2018  . Hx of fracture of radius 08/18/2016  . Vitamin D deficiency 01/07/2016  . Hyperlipidemia 01/02/2016  . Hypertension 01/02/2016  . Chronic pain 01/02/2016  . Obesity (BMI 30.0-34.9) 01/02/2016  . CAD (coronary artery disease) 01/02/2016  . IBS (irritable bowel syndrome) 01/02/2016  . FH: colon cancer 01/02/2016  . DDD (degenerative disc disease), cervical 01/02/2016  . Primary osteoarthritis of left knee 01/02/2016  . Smoker 01/02/2016    No Known Allergies  Past Surgical History:  Procedure Laterality Date  . ABDOMINAL SURGERY    .  APPENDECTOMY    . BLADDER NECK SUSPENSION    . cardiac stents    . CHOLECYSTECTOMY N/A 08/14/2018   Procedure: LAPAROSCOPIC CHOLECYSTECTOMY;  Surgeon: Jules Husbands, MD;  Location: [facility] ORS;  Service: General;  Laterality: N/A;  . HERNIA REPAIR    . LEFT HEART CATHETERIZATION WITH CORONARY ANGIOGRAM N/A 09/29/2014   Procedure: LEFT HEART CATHETERIZATION WITH CORONARY ANGIOGRAM;  Surgeon: Clent Demark, MD;  Location: [facility] C[facility] LAB;  Service: Cardiovascular;  Laterality: N/A;    Social History   Tobacco Use  . Smoking status: Current Every Day Smoker    Packs/day: 0.25    Years: 39.00    Pack years: 9.75    Types: Cigarettes    Start date: 12/21/1978  . Smokeless tobacco: Never Used  . Tobacco comment: 3-4 daily  Substance Use Topics  . Alcohol use: Yes    Alcohol/week: 0.0 standard drinks    Comment: occassional  . Drug use: No     Medication list has been reviewed and updated.  Current Meds  Medication Sig  . aspirin 81 MG chewable tablet Chew 1 tablet (81 mg total) by mouth daily.  [name] Kitchen atorvastatin (LIPITOR) 80 MG tablet Take 0.5 tablets (40 mg total) by mouth daily at 6 PM.  . B Complex Vitamins (VITAMIN B COMPLEX PO) Take 1 tablet by mouth daily.   [name] Kitchen CALCIUM PO Take 1 tablet by mouth daily.   . cetirizine (ZYRTEC) 10 MG tablet Take 10 mg by mouth daily.  . Cholecalciferol (VITAMIN D)  2000 units CAPS Take 1 capsule (2,000 Units total) by mouth daily.  . metoprolol tartrate (LOPRESSOR) 25 MG tablet Take 12.5 mg by mouth 2 (two) times daily.  . nitroGLYCERIN (NITROSTAT) 0.4 MG SL tablet Place 1 tablet (0.4 mg total) under the tongue every 5 (five) minutes x 3 doses as needed for chest pain.  . polyethylene glycol (MIRALAX / GLYCOLAX) packet Take 17 g by mouth daily as needed.    PHQ 2/9 Scores 02/08/2018 01/02/2016  PHQ - 2 Score 0 0    Physical Exam  Constitutional: She is oriented to person, place, and time. She appears well-developed. No distress.  HENT:  Head:  Normocephalic and atraumatic.  Neck: Normal range of motion. Neck supple.  Cardiovascular: Normal rate and regular rhythm.  Pulmonary/Chest: Effort normal.  Abdominal:    Other surgical ports healed  Musculoskeletal: Normal range of motion. She exhibits edema (trace pitting edema at both ankles).  No calf tenderness, redness or cord  Neurological: She is alert and oriented to person, place, and time.  Skin: Skin is warm and dry. No rash noted.  Psychiatric: She has a normal mood and affect. Her behavior is normal. Thought content normal.  Nursing note and vitals reviewed.   BP 116/70 (BP Location: Right Arm, Patient Position: Sitting, Cuff Size: Normal)   Pulse 61   Ht 5\' 5"  (1.651 m)   Wt 172 lb (78 kg)   SpO2 96%   BMI 28.62 kg/m   Assessment and Plan: 1. Localized edema Encourage elevation, high protein snacks Use dyazide sparingly only for severe edema ( 1-2 times per week) - Comprehensive metabolic panel - triamterene-hydrochlorothiazide (DYAZIDE) 37.5-25 MG capsule; Take 1 each (1 capsule total) by mouth daily as needed.  Dispense: 30 capsule; Refill: 0  2. Postoperative dehiscence of skin wound, initial encounter Apply silvadene twice a day Clean with warm water only Follow up with surgery as planned - sooner if area worsens - silver sulfADIAZINE (SILVADENE) 1 % cream; Apply 1 application topically daily.  Dispense: 25 g; Refill: 0 - CBC with Differential/Platelet   Meds ordered this encounter  Medications  . silver sulfADIAZINE (SILVADENE) 1 % cream    Sig: Apply 1 application topically daily.    Dispense:  25 g    Refill:  0  . triamterene-hydrochlorothiazide (DYAZIDE) 37.5-25 MG capsule    Sig: Take 1 each (1 capsule total) by mouth daily as needed.    Dispense:  30 capsule    Refill:  0    Partially dictated using Editor, commissioning. Any errors are unintentional.  Halina Maidens, MD [facility]  08/29/2018

## 2018-08-29 NOTE — Telephone Encounter (Signed)
Spoke with Dr Army Melia. Please schedule pt for appt to discuss. Thank you.

## 2018-08-30 LAB — CBC WITH DIFFERENTIAL/PLATELET
BASOS: 1 %
Basophils Absolute: 0.1 10*3/uL (ref 0.0–0.2)
EOS (ABSOLUTE): 0.2 10*3/uL (ref 0.0–0.4)
EOS: 3 %
HEMATOCRIT: 35.8 % (ref 34.0–46.6)
HEMOGLOBIN: 12.2 g/dL (ref 11.1–15.9)
IMMATURE GRANULOCYTES: 1 %
Immature Grans (Abs): 0.1 10*3/uL (ref 0.0–0.1)
LYMPHS ABS: 2.1 10*3/uL (ref 0.7–3.1)
Lymphs: 27 %
MCH: 30.2 pg (ref 26.6–33.0)
MCHC: 34.1 g/dL (ref 31.5–35.7)
MCV: 89 fL (ref 79–97)
MONOCYTES: 11 %
Monocytes Absolute: 0.9 10*3/uL (ref 0.1–0.9)
Neutrophils Absolute: 4.5 10*3/uL (ref 1.4–7.0)
Neutrophils: 57 %
Platelets: 400 10*3/uL (ref 150–450)
RBC: 4.04 x10E6/uL (ref 3.77–5.28)
RDW: 14.4 % (ref 12.3–15.4)
WBC: 7.8 10*3/uL (ref 3.4–10.8)

## 2018-08-30 LAB — COMPREHENSIVE METABOLIC PANEL
ALBUMIN: 3.8 g/dL (ref 3.5–4.8)
ALT: 21 IU/L (ref 0–32)
AST: 34 IU/L (ref 0–40)
Albumin/Globulin Ratio: 1.3 (ref 1.2–2.2)
Alkaline Phosphatase: 133 IU/L — ABNORMAL HIGH (ref 39–117)
BUN / CREAT RATIO: 5 — AB (ref 12–28)
BUN: 4 mg/dL — ABNORMAL LOW (ref 8–27)
Bilirubin Total: 0.4 mg/dL (ref 0.0–1.2)
CALCIUM: 9.7 mg/dL (ref 8.7–10.3)
CO2: 22 mmol/L (ref 20–29)
Chloride: 95 mmol/L — ABNORMAL LOW (ref 96–106)
Creatinine, Ser: 0.74 mg/dL (ref 0.57–1.00)
GFR, EST AFRICAN AMERICAN: 94 mL/min/{1.73_m2} (ref >59)
GFR, EST NON AFRICAN AMERICAN: 82 mL/min/{1.73_m2} (ref >59)
GLOBULIN, TOTAL: 2.9 g/dL (ref 1.5–4.5)
Glucose: 94 mg/dL (ref 65–99)
Potassium: 3.5 mmol/L (ref 3.5–5.2)
SODIUM: 137 mmol/L (ref 134–144)
Total Protein: 6.7 g/dL (ref 6.0–8.5)

## 2018-09-07 ENCOUNTER — Encounter: Payer: Self-pay | Admitting: Surgery

## 2018-09-07 ENCOUNTER — Ambulatory Visit (INDEPENDENT_AMBULATORY_CARE_PROVIDER_SITE_OTHER): Payer: Medicare Other | Admitting: Surgery

## 2018-09-07 VITALS — BP 102/66 | HR 87 | Temp 97.7°F | Resp 20 | Ht 65.0 in | Wt 162.0 lb

## 2018-09-07 DIAGNOSIS — Z09 Encounter for follow-up examination after completed treatment for conditions other than malignant neoplasm: Secondary | ICD-10-CM

## 2018-09-07 DIAGNOSIS — Z4889 Encounter for other specified surgical aftercare: Secondary | ICD-10-CM

## 2018-09-07 NOTE — Progress Notes (Signed)
S/P Chole  Doing well from surgical perspective Main issues is pulmonary, she has appt tomorrow w Pulmonologist + PO, no fevers  PE AND Lungs; wheezes bilateralWounds healing well, no infection or peritonitis Ext: no edema   A./P Doing well from cholecystectomy Needs pulmonary eval RTC prn

## 2018-09-07 NOTE — Patient Instructions (Addendum)
The patient is aware to call back for any questions or new concerns. Keep appointment with Pulmonary physician

## 2018-09-07 NOTE — Progress Notes (Signed)
[facility] Consultation      Assessment and Plan:  Acute bronchitis with cough, dyspnea. - We will start nebulized DuoNeb 3 times daily. - We will check sputum culture.  Chronic bronchitis/COPD with emphysema. - Hyperinflation seen on chest x-ray suggestive of emphysema.  Will check a pulmonary function test once she has returned to baseline. - Continue trilogy inhaler. - Would consider lung cancer screening once the patient is more stable. Prevnar 01/02/16 PPSV 23 09/30/14  Nicotine abuse. - Discussed importance of smoke cessation, spent for medicine discussion. - We will send in prescription for nicotine patch.  Orders Placed This Encounter  Procedures  . Expectorated sputum assessment w rflx to resp cult   Meds ordered this encounter  Medications  . nicotine (NICODERM CQ - DOSED IN MG/24 HOURS) 21 mg/24hr patch    Sig: Place 1 patch (21 mg total) onto the skin daily.    Dispense:  30 patch    Refill:  1  . ipratropium-albuterol (DUONEB) 0.5-2.5 (3) MG/3ML SOLN    Sig: Take 3 mLs by nebulization 3 (three) times daily.    Dispense:  360 mL    Refill:  1  . AMBULATORY NON FORMULARY MEDICATION    Sig: Medication Name: Please provide patient with Nebulizer. DX: J44.9    Dispense:  1 each    Refill:  0     Date: 09/08/2018  MRN# [id number] Kristi [name] [id number] [birth year]   Kristi [name] is a 72 y.o. old female seen in consultation for chief complaint of:    Chief Complaint  Patient presents with  . Consult    Referred by Dr. Army Melia for eval/management of COPD  . Cough    pt was given a sample Trelegy. She states after trelegy wears off sx of cough, sob increase.  . Wheezing  . Shortness of Breath    with exertion    HPI:  She has been having a lot of coughing and dyspnea, she is coughing up clear sputum, which is excessive, this has been going on for about a month. There is no blood. She was tried on mucinex which helped thin the  secretions out. She was started on Trelegy inhaler which once per day which she thinks helps for an hour or so.  She had cholecystomy about 2 weeks ago.  She feels that she has occasional sinus drainage, she denies reflux.  She has no pets at home.  She is smoking about half ppd, was about a ppd, she has been smoking about 40 years.   She has typically been very active, she golfs 9 holes about twice per week. In general she feels that her breathing has been stable over the past few years until this incident.    **Chest x-ray 08/12/2018>> hyperinflation suggestive of COPD, elevated left diaphragm, changes of chronic bronchitis.  Imaging personally reviewed. **CBC 08/29/2018>> absolute eosinophil count 200   PMHX:   Past Medical History:  Diagnosis Date  . 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 Coronary artery disease   . Diabetes insipidus ([place])   . Hypercholesteremia   . Hypertension   . MI, old    Surgical Hx:  Past Surgical History:  Procedure Laterality Date  . ABDOMINAL SURGERY    . APPENDECTOMY    . BLADDER NECK SUSPENSION    . cardiac stents    . CHOLECYSTECTOMY N/A 08/14/2018   Procedure: LAPAROSCOPIC CHOLECYSTECTOMY;  Surgeon: Jules Husbands, MD;  Location: [facility] ORS;  Service: General;  Laterality: N/A;  . HERNIA REPAIR    . LEFT HEART CATHETERIZATION WITH CORONARY ANGIOGRAM N/A 09/29/2014   Procedure: LEFT HEART CATHETERIZATION WITH CORONARY ANGIOGRAM;  Surgeon: Clent Demark, MD;  Location: [facility] CATH LAB;  Service: Cardiovascular;  Laterality: N/A;   Family Hx:  Family History  Problem Relation Age of Onset  . Cancer Mother   . Cancer Father   . Breast cancer Neg Hx    Social Hx:   Social History   Tobacco Use  . Smoking status: Current Every Day Smoker    Packs/day: 0.25    Years: 39.00    Pack years: 9.75    Types: Cigarettes    Start date: 12/21/1978  . Smokeless tobacco: Never Used  . Tobacco comment: 3-4 daily  Substance Use Topics  .  Alcohol use: Yes    Alcohol/week: 0.0 standard drinks    Comment: occassional  . Drug use: No   Medication:    Current Outpatient Medications:  .  aspirin 81 MG chewable tablet, Chew 1 tablet (81 mg total) by mouth daily., Disp: 30 tablet, Rfl: 3 .  atorvastatin (LIPITOR) 80 MG tablet, Take 0.5 tablets (40 mg total) by mouth daily at 6 PM., Disp: 30 tablet, Rfl: 3 .  B Complex Vitamins (VITAMIN B COMPLEX PO), Take 1 tablet by mouth daily. , Disp: , Rfl:  .  CALCIUM PO, Take 1 tablet by mouth daily. , Disp: , Rfl:  .  cetirizine (ZYRTEC) 10 MG tablet, Take 10 mg by mouth daily., Disp: , Rfl:  .  Cholecalciferol (VITAMIN D) 2000 units CAPS, Take 1 capsule (2,000 Units total) by mouth daily., Disp: 30 capsule, Rfl:  .  docusate sodium (COLACE) 100 MG capsule, Take 100 mg by mouth daily., Disp: , Rfl:  .  furosemide (LASIX) 20 MG tablet, Take 1 tablet (20 mg total) by mouth 2 (two) times daily. (Patient taking differently: Take 20 mg by mouth as needed. ), Disp: 6 tablet, Rfl: 0 .  metoprolol tartrate (LOPRESSOR) 25 MG tablet, Take 12.5 mg by mouth 2 (two) times daily., Disp: , Rfl:  .  nitroGLYCERIN (NITROSTAT) 0.4 MG SL tablet, Place 1 tablet (0.4 mg total) under the tongue every 5 (five) minutes x 3 doses as needed for chest pain., Disp: 25 tablet, Rfl: 12 .  polyethylene glycol (MIRALAX / GLYCOLAX) packet, Take 17 g by mouth daily as needed., Disp: , Rfl:  .  silver sulfADIAZINE (SILVADENE) 1 % cream, Apply 1 application topically daily., Disp: 25 g, Rfl: 0 .  triamterene-hydrochlorothiazide (DYAZIDE) 37.5-25 MG capsule, Take 1 each (1 capsule total) by mouth daily as needed., Disp: 30 capsule, Rfl: 0   Allergies:  Patient has no known allergies.  Review of Systems: Gen:  Denies  fever, sweats, chills HEENT: Denies blurred vision, double vision. bleeds, sore throat Cvc:  No dizziness, chest pain. Resp:   Denies cough or sputum production, shortness of breath Gi: Denies swallowing  difficulty, stomach pain. Gu:  Denies bladder incontinence, burning urine Ext:   No Joint pain, stiffness. Skin: No skin rash,  hives  Endoc:  No polyuria, polydipsia. Psych: No depression, insomnia. Other:  All other systems were reviewed with the patient and were negative other that what is mentioned in the HPI.   Physical Examination:   VS: BP 100/78 (BP Location: Left Arm, Cuff Size: Normal)   Pulse 73   Resp 16   Ht 5\' 5"  (1.651 m)   Wt 162  lb (73.5 kg)   SpO2 92%   BMI 26.96 kg/m   General Appearance: No distress  Neuro:without focal findings,  speech normal,  HEENT: PERRLA, EOM intact.   Pulmonary: normal breath sounds, No wheezing.  CardiovascularNormal S1,S2.  No m/r/g.   Abdomen: Benign, Soft, non-tender. Renal:  No costovertebral tenderness  GU:  No performed at this time. Endoc: No evident thyromegaly, no signs of acromegaly. Skin:   warm, no rashes, no ecchymosis  Extremities: normal, no cyanosis, clubbing.  Other findings:    LABORATORY PANEL:   CBC No results for input(s): WBC, HGB, HCT, PLT in the last 168 hours. ------------------------------------------------------------------------------------------------------------------  Chemistries  No results for input(s): NA, K, CL, CO2, GLUCOSE, BUN, CREATININE, CALCIUM, MG, AST, ALT, ALKPHOS, BILITOT in the last 168 hours.  Invalid input(s): GFRCGP ------------------------------------------------------------------------------------------------------------------  Cardiac Enzymes No results for input(s): TROPONINI in the last 168 hours. ------------------------------------------------------------  RADIOLOGY:  No results found.     Thank  you for the consultation and for allowing [facility] to assist in the care of your patient. Our recommendations are noted above.  Please contact us if we can be of further service.   Marda Stalker, M.D., F.C.C.P.  Board Certified in  Internal Medicine, Pulmonary Medicine, [place], and Sleep Medicine.  [facility] Office Number: [phone]   09/08/2018

## 2018-09-08 ENCOUNTER — Encounter: Payer: Self-pay | Admitting: Internal Medicine

## 2018-09-08 ENCOUNTER — Telehealth: Payer: Self-pay | Admitting: Internal Medicine

## 2018-09-08 ENCOUNTER — Ambulatory Visit (INDEPENDENT_AMBULATORY_CARE_PROVIDER_SITE_OTHER): Payer: Medicare Other | Admitting: Internal Medicine

## 2018-09-08 ENCOUNTER — Other Ambulatory Visit: Payer: Self-pay | Admitting: Internal Medicine

## 2018-09-08 VITALS — BP 100/78 | HR 73 | Resp 16 | Ht 65.0 in | Wt 162.0 lb

## 2018-09-08 DIAGNOSIS — J440 Chronic obstructive pulmonary disease with acute lower respiratory infection: Secondary | ICD-10-CM

## 2018-09-08 DIAGNOSIS — I25119 Atherosclerotic heart disease of native coronary artery with unspecified angina pectoris: Secondary | ICD-10-CM | POA: Diagnosis not present

## 2018-09-08 DIAGNOSIS — J209 Acute bronchitis, unspecified: Secondary | ICD-10-CM

## 2018-09-08 DIAGNOSIS — F17210 Nicotine dependence, cigarettes, uncomplicated: Secondary | ICD-10-CM | POA: Diagnosis not present

## 2018-09-08 MED ORDER — IPRATROPIUM-ALBUTEROL 0.5-2.5 (3) MG/3ML IN SOLN: 3.0000 mL | Freq: Three times a day (TID) | RESPIRATORY_TRACT | 1 refills | Status: DC

## 2018-09-08 MED ORDER — NICOTINE 21 MG/24HR TD PT24: 21.0000 mg | MEDICATED_PATCH | TRANSDERMAL | 1 refills | Status: DC

## 2018-09-08 MED ORDER — AMBULATORY NON FORMULARY MEDICATION: 0 refills | Status: DC

## 2018-09-08 NOTE — Patient Instructions (Addendum)
Will start nebulizer with albuterol/ipratropium to be used three times daily.  Continue trelegy inhaler.  Will check sputum.   --Quitting smoking is the most important thing that you can do for your health.  --Quitting smoking will have greater affect on your health than any medicine that we can give you.   --The best way to quit is to set a quit date, usually a day that has meaning like someone's birthday, anniversary, or holiday.   --Start any medication prescribed for quitting one week before you quit date. Then toss out the cigarettes on your quit date.  --If you start smoking again, start from scratch--set another quit day and try again!

## 2018-09-08 NOTE — Telephone Encounter (Signed)
Pharmacist needs dx code for Ipratropium-Albuterol solution .

## 2018-09-08 NOTE — Telephone Encounter (Signed)
Pt aware [facility] contacted and and rx will be filled.

## 2018-09-08 NOTE — Telephone Encounter (Signed)
Pt c/o medication issue:  1. Name of Medication: albuterol neb  2. How are you currently taking this medication (dosage and times per day)? New rx   3. Are you having a reaction (difficulty breathing--STAT)? No   4. What is your medication issue?  Patient says [facility] in [place] cannot fill until they receive rx with dx code for insurance   Please call pharmacy to fix

## 2018-09-08 NOTE — Telephone Encounter (Signed)
Resubmitted order for Ipratropium-Albuterol solution with dx J44.9.

## 2018-09-18 ENCOUNTER — Encounter: Payer: Self-pay | Admitting: Internal Medicine

## 2018-09-19 ENCOUNTER — Encounter: Payer: Self-pay | Admitting: Internal Medicine

## 2018-09-19 ENCOUNTER — Ambulatory Visit (INDEPENDENT_AMBULATORY_CARE_PROVIDER_SITE_OTHER): Payer: Medicare Other | Admitting: Internal Medicine

## 2018-09-19 VITALS — BP 94/58 | HR 102 | Ht 65.0 in | Wt 158.0 lb

## 2018-09-19 DIAGNOSIS — Z1211 Encounter for screening for malignant neoplasm of colon: Secondary | ICD-10-CM

## 2018-09-19 DIAGNOSIS — I25118 Atherosclerotic heart disease of native coronary artery with other forms of angina pectoris: Secondary | ICD-10-CM

## 2018-09-19 DIAGNOSIS — F17200 Nicotine dependence, unspecified, uncomplicated: Secondary | ICD-10-CM | POA: Diagnosis not present

## 2018-09-19 DIAGNOSIS — Z0000 Encounter for general adult medical examination without abnormal findings: Secondary | ICD-10-CM | POA: Diagnosis not present

## 2018-09-19 DIAGNOSIS — R600 Localized edema: Secondary | ICD-10-CM

## 2018-09-19 DIAGNOSIS — E782 Mixed hyperlipidemia: Secondary | ICD-10-CM

## 2018-09-19 DIAGNOSIS — R7303 Prediabetes: Secondary | ICD-10-CM | POA: Diagnosis not present

## 2018-09-19 DIAGNOSIS — I10 Essential (primary) hypertension: Secondary | ICD-10-CM | POA: Diagnosis not present

## 2018-09-19 DIAGNOSIS — I519 Heart disease, unspecified: Secondary | ICD-10-CM

## 2018-09-19 DIAGNOSIS — J441 Chronic obstructive pulmonary disease with (acute) exacerbation: Secondary | ICD-10-CM | POA: Diagnosis not present

## 2018-09-19 DIAGNOSIS — I5189 Other ill-defined heart diseases: Secondary | ICD-10-CM | POA: Insufficient documentation

## 2018-09-19 LAB — POCT URINALYSIS DIPSTICK
Bilirubin, UA: NEGATIVE
Glucose, UA: NEGATIVE
Ketones, UA: NEGATIVE
Nitrite, UA: POSITIVE
PH UA: 6 (ref 5.0–8.0)
PROTEIN UA: NEGATIVE
Spec Grav, UA: <=1.005 — AB (ref 1.010–1.025)
UROBILINOGEN UA: 0.2 U/dL

## 2018-09-19 MED ORDER — FLUTICASONE-UMECLIDIN-VILANT 100-62.5-25 MCG/INH IN AEPB: 1.0000 | INHALATION_SPRAY | Freq: Every day | RESPIRATORY_TRACT | 12 refills | Status: DC

## 2018-09-19 MED ORDER — NICOTINE POLACRILEX 2 MG MT GUM: 2.0000 mg | CHEWING_GUM | OROMUCOSAL | 0 refills | Status: DC | PRN

## 2018-09-19 MED ORDER — ALBUTEROL SULFATE HFA 108 (90 BASE) MCG/ACT IN AERS: 2.0000 | INHALATION_SPRAY | Freq: Four times a day (QID) | RESPIRATORY_TRACT | 2 refills | Status: DC | PRN

## 2018-09-19 NOTE — Patient Instructions (Signed)
Health Maintenance  Topic Date Due  . COLONOSCOPY  08/21/2017  . INFLUENZA VACCINE  09/30/2019 (Originally 07/21/2018)  . MAMMOGRAM  06/09/2019  . TETANUS/TDAP  12/22/2019  . DEXA SCAN  Completed  . Hepatitis C Screening  Completed  . PNA vac Low Risk Adult  Completed

## 2018-09-19 NOTE — Progress Notes (Signed)
Patient: Kristi [name], Female    DOB: [birth year], 72 y.o.   MRN: [id number] Visit Date: 09/19/2018  Today's Provider: Halina Maidens, MD   Chief Complaint  Patient presents with  . Medicare Wellness    Patient declined breast exam. Had mammo in June.   Subjective:    Annual wellness visit Kristi [name] is a 72 y.o. female who presents today for her Subsequent Annual Wellness Visit. She feels fairly well. She reports exercising walking. She reports she is sleeping fairly well. She is due for colonoscopy.  She declines flu vaccine. She is recovering well from abdominal surgery for necrotic gall bladder. ----------------------------------------------------------- Shortness of Breath  This is a chronic problem. The problem occurs daily. Pertinent negatives include no abdominal pain, chest pain, fever, headaches, leg swelling, rash, vomiting or wheezing. She has tried beta agonist inhalers, steroid inhalers and ipratropium inhalers for the symptoms. Her past medical history is significant for CAD and COPD.  Hypertension  This is a chronic problem. The problem is unchanged. Associated symptoms include shortness of breath. Pertinent negatives include no chest pain, headaches or palpitations. Past treatments include beta blockers and diuretics. The current treatment provides significant improvement.  Hyperlipidemia  This is a chronic problem. Associated symptoms include shortness of breath. Pertinent negatives include no chest pain. Current antihyperlipidemic treatment includes statins. The current treatment provides significant improvement of lipids.  Nicotine Dependence  Presents for initial visit. Symptoms are negative for fatigue. Preferred tobacco types include cigarettes. Her urge triggers include company of smokers. She smokes < 1/2 a pack of cigarettes per day. Past treatments include nothing. Kristi [name] is thinking about quitting.  CAD - stable with no recent chest pain or use of NTG.   She continues on daily metoprolol. Recent ECHO showed EF 55% with grade 1 diastolic dysfunction.  Review of Systems  Constitutional: Negative for chills, fatigue and fever.  HENT: Negative for congestion, hearing loss, tinnitus, trouble swallowing and voice change.   Eyes: Negative for visual disturbance.  Respiratory: Positive for cough and shortness of breath. Negative for chest tightness and wheezing.   Cardiovascular: Negative for chest pain, palpitations and leg swelling.  Gastrointestinal: Negative for abdominal pain, constipation, diarrhea and vomiting.  Endocrine: Negative for polydipsia and polyuria.  Genitourinary: Negative for dysuria, frequency, genital sores, vaginal bleeding and vaginal discharge.  Musculoskeletal: Negative for arthralgias, gait problem and joint swelling.  Skin: Negative for color change and rash.  Neurological: Negative for dizziness, tremors, light-headedness and headaches.  Hematological: Negative for adenopathy. Does not bruise/bleed easily.  Psychiatric/Behavioral: Negative for dysphoric mood and sleep disturbance. The patient is not nervous/anxious.     Social History   Socioeconomic History  . Marital status: Married    Spouse name: Not on file  . Number of children: Not on file  . Years of education: Not on file  . Highest education level: Not on file  Occupational History  . Not on file  Social Needs  . Financial resource strain: Not on file  . Food insecurity:    Worry: Not on file    Inability: Not on file  . Transportation needs:    Medical: Not on file    Non-medical: Not on file  Tobacco Use  . Smoking status: Current Every Day Smoker    Packs/day: 0.25    Years: 39.00    Pack years: 9.75    Types: Cigarettes    Start date: 12/21/1978  . Smokeless tobacco: Never Used  . Tobacco comment:  3-4 daily  Substance and Sexual Activity  . Alcohol use: Yes    Alcohol/week: 0.0 standard drinks    Comment: occassional  . Drug use: No    . Sexual activity: Yes    Birth control/protection: None  Lifestyle  . Physical activity:    Days per week: Not on file    Minutes per session: Not on file  . Stress: Not on file  Relationships  . Social connections:    Talks on phone: Not on file    Gets together: Not on file    Attends religious service: Not on file    Active member of club or organization: Not on file    Attends meetings of clubs or organizations: Not on file    Relationship status: Not on file  . Intimate partner violence:    Fear of current or ex partner: Not on file    Emotionally abused: Not on file    Physically abused: Not on file    Forced sexual activity: Not on file  Other Topics Concern  . Not on file  Social History Narrative  . Not on file    Patient Active Problem List   Diagnosis Date Noted  . Localized edema 08/29/2018  . Cholecystitis 08/13/2018  . COPD with exacerbation ([place]) 08/11/2018  . Osteopenia determined by x-ray 05/19/2018  . Prediabetes 02/08/2018  . Hx of fracture of radius 08/18/2016  . Vitamin D deficiency 01/07/2016  . Hyperlipidemia 01/02/2016  . Hypertension 01/02/2016  . Chronic pain 01/02/2016  . Obesity (BMI 30.0-34.9) 01/02/2016  . Coronary artery disease with stable angina pectoris ([place]) 01/02/2016  . IBS (irritable bowel syndrome) 01/02/2016  . FH: colon cancer 01/02/2016  . DDD (degenerative disc disease), cervical 01/02/2016  . Primary osteoarthritis of left knee 01/02/2016  . Tobacco use disorder 01/02/2016    Past Surgical History:  Procedure Laterality Date  . ABDOMINAL SURGERY    . APPENDECTOMY    . BLADDER NECK SUSPENSION    . cardiac stents    . CHOLECYSTECTOMY N/A 08/14/2018   Procedure: LAPAROSCOPIC CHOLECYSTECTOMY;  Surgeon: Jules Husbands, MD;  Location: [facility] ORS;  Service: General;  Laterality: N/A;  . HERNIA REPAIR    . LEFT HEART CATHETERIZATION WITH CORONARY ANGIOGRAM N/A 09/29/2014   Procedure: LEFT HEART CATHETERIZATION WITH CORONARY  ANGIOGRAM;  Surgeon: Clent Demark, MD;  Location: [facility] CATH LAB;  Service: Cardiovascular;  Laterality: N/A;    Her family history includes Cancer in her father and mother.     Current Meds  Medication Sig  . AMBULATORY NON FORMULARY MEDICATION Medication Name: Please provide patient with Nebulizer. DX: J44.9  . aspirin 81 MG chewable tablet Chew 1 tablet (81 mg total) by mouth daily.  [name] Kitchen atorvastatin (LIPITOR) 80 MG tablet Take 0.5 tablets (40 mg total) by mouth daily at 6 PM.  . B Complex Vitamins (VITAMIN B COMPLEX PO) Take 1 tablet by mouth daily.   [name] Kitchen CALCIUM PO Take 1 tablet by mouth daily.   . cetirizine (ZYRTEC) 10 MG tablet Take 10 mg by mouth daily.  . Cholecalciferol (VITAMIN D) 2000 units CAPS Take 1 capsule (2,000 Units total) by mouth daily.  [name] Kitchen docusate sodium (COLACE) 100 MG capsule Take 100 mg by mouth daily.  . furosemide (LASIX) 20 MG tablet Take 1 tablet (20 mg total) by mouth 2 (two) times daily. (Patient taking differently: Take 20 mg by mouth as needed. )  . ipratropium-albuterol (DUONEB) 0.5-2.5 (3) MG/3ML SOLN Take 3  mLs by nebulization 3 (three) times daily. DX. J44.9  . metoprolol tartrate (LOPRESSOR) 25 MG tablet Take 12.5 mg by mouth 2 (two) times daily.  . nicotine (NICODERM CQ - DOSED IN MG/24 HOURS) 21 mg/24hr patch Place 1 patch (21 mg total) onto the skin daily.  . nitroGLYCERIN (NITROSTAT) 0.4 MG SL tablet Place 1 tablet (0.4 mg total) under the tongue every 5 (five) minutes x 3 doses as needed for chest pain.  . polyethylene glycol (MIRALAX / GLYCOLAX) packet Take 17 g by mouth daily as needed.  . silver sulfADIAZINE (SILVADENE) 1 % cream Apply 1 application topically daily.  [name] Kitchen triamterene-hydrochlorothiazide (DYAZIDE) 37.5-25 MG capsule Take 1 each (1 capsule total) by mouth daily as needed.    Patient Care Team: Glean Hess, MD as PCP - General (Internal Medicine) Charolette Forward, MD as Consulting Physician (Cardiology)       Objective:    Vitals: BP (!) 94/58 (BP Location: Right Arm, Patient Position: Sitting, Cuff Size: Normal)   Pulse (!) 102   Ht 5\' 5"  (1.651 m)   Wt 158 lb (71.7 kg)   SpO2 97%   BMI 26.29 kg/m   Physical Exam  Constitutional: She is oriented to person, place, and time. She appears well-developed and well-nourished. No distress.  HENT:  Head: Normocephalic and atraumatic.  Right Ear: Tympanic membrane and ear canal normal.  Left Ear: Tympanic membrane and ear canal normal.  Nose: Right sinus exhibits no maxillary sinus tenderness. Left sinus exhibits no maxillary sinus tenderness.  Mouth/Throat: Uvula is midline and oropharynx is clear and moist.  Eyes: Conjunctivae and EOM are normal. Right eye exhibits no discharge. Left eye exhibits no discharge. No scleral icterus.  Neck: Normal range of motion. Carotid bruit is not present. No erythema present. No thyromegaly present.  Cardiovascular: Normal rate, regular rhythm, normal heart sounds and normal pulses.  Pulmonary/Chest: Effort normal. No respiratory distress. She has decreased breath sounds. She has no wheezes. She has rhonchi.  Abdominal: Soft. Bowel sounds are normal. There is no hepatosplenomegaly. There is no tenderness. There is no CVA tenderness.  Musculoskeletal: Normal range of motion.  Lymphadenopathy:    She has no cervical adenopathy.    She has no axillary adenopathy.  Neurological: She is alert and oriented to person, place, and time. She has normal reflexes. No cranial nerve deficit or sensory deficit.  Skin: Skin is warm, dry and intact. No rash noted.  Psychiatric: She has a normal mood and affect. Her speech is normal and behavior is normal. Thought content normal. Cognition and memory are normal.  Nursing note and vitals reviewed.   Activities of Daily Living In your present state of health, do you have any difficulty performing the following activities: 09/19/2018 08/13/2018  Hearing? N N  Vision? N N  Difficulty  concentrating or making decisions? N N  Walking or climbing stairs? N Y  Dressing or bathing? N N  Doing errands, shopping? N N  Preparing Food and eating ? N -  Using the Toilet? N -  In the past six months, have you accidently leaked urine? Y -  Do you have problems with loss of bowel control? N -  Managing your Medications? N -  Managing your Finances? N -  Housekeeping or managing your Housekeeping? N -  Some recent data might be hidden    Fall Risk Assessment Fall Risk  09/19/2018 02/08/2018 07/12/2017 01/02/2016  Falls in the past year? No No No No  Comment - -  [facility]i Telephone Survey: data to providers prior to load -     Depression Screen PHQ 2/9 Scores 09/19/2018 02/08/2018 01/02/2016  PHQ - 2 Score 0 0 0    6CIT Screen 09/19/2018  What Year? 0 points  What month? 0 points  What time? 0 points  Count back from 20 0 points  Months in reverse 0 points  Repeat phrase 4 points  Total Score 4   6CIT Screen 09/19/2018  What Year? 0 points  What month? 0 points  What time? 0 points  Count back from 20 0 points  Months in reverse 0 points  Repeat phrase 4 points  Total Score 4     Medicare Annual Wellness Visit Summary:  Reviewed patient's Family Medical History Reviewed and updated list of patient's medical providers Assessment of cognitive impairment was done Assessed patient's functional ability Established a written schedule for health screening [place] Completed and Reviewed  Exercise Activities and Dietary recommendations Goals   None     Immunization History  Administered Date(s) Administered  . Influenza,inj,Quad PF,6+ Mos 01/02/2016  . Pneumococcal Conjugate-13 01/02/2016  . Pneumococcal Polysaccharide-23 09/30/2014    Health Maintenance  Topic Date Due  . COLONOSCOPY  08/21/2017  . INFLUENZA VACCINE  09/30/2019 (Originally 07/21/2018)  . MAMMOGRAM  06/09/2019  . TETANUS/TDAP  12/22/2019  . DEXA SCAN  Completed  . Hepatitis  C Screening  Completed  . PNA vac Low Risk Adult  Completed    Discussed health benefits of physical activity, and encouraged her to engage in regular exercise appropriate for her age and condition.    ------------------------------------------------------------------------------------------------------------  Assessment & Plan:  1. Medicare annual wellness visit, subsequent Measures satisfied - POCT urinalysis dipstick - positive for WBC and bacteria but pt asx so will not treat at this time  2. Essential hypertension controlled - CBC with Differential/Platelet - Comprehensive metabolic panel  3. Coronary artery disease of native artery of native heart with stable angina pectoris (HCC) Stable sx  4. COPD with exacerbation ([place]) Continue nebulizer and Trelegy Recommend flu vaccine - Fluticasone-Umeclidin-Vilant (TRELEGY ELLIPTA) 100-62.5-25 MCG/INH AEPB; Inhale 1 puff into the lungs daily.  Dispense: 1 each; Refill: 12 - albuterol (PROAIR HFA) 108 (90 Base) MCG/ACT inhaler; Inhale 2 puffs into the lungs every 6 (six) hours as needed for wheezing or shortness of breath.  Dispense: 18 g; Refill: 2  5. Prediabetes Check labs Work on low carb diet - Comprehensive metabolic panel - Hemoglobin A1c  6. Mixed hyperlipidemia Continue statin - Lipid panel  7. Localized edema Improved; using diuretic prn only - TSH  8. Tobacco use disorder Will start patches or gum in the near future - nicotine polacrilex (NICORETTE) 2 MG gum; Take 1 each (2 mg total) by mouth as needed for smoking cessation.  Dispense: 100 tablet; Refill: 0  9. Colon cancer screening - Ambulatory referral to Gastroenterology   Partially dictated using Dragon software. Any errors are unintentional.  Halina Maidens, MD [facility]  09/19/2018

## 2018-09-20 ENCOUNTER — Other Ambulatory Visit: Payer: Self-pay

## 2018-09-20 DIAGNOSIS — Z1211 Encounter for screening for malignant neoplasm of colon: Secondary | ICD-10-CM

## 2018-09-20 LAB — CBC WITH DIFFERENTIAL/PLATELET
BASOS ABS: 0.1 10*3/uL (ref 0.0–0.2)
Basos: 1 %
EOS (ABSOLUTE): 0.3 10*3/uL (ref 0.0–0.4)
Eos: 4 %
Hematocrit: 42.7 % (ref 34.0–46.6)
Hemoglobin: 14.5 g/dL (ref 11.1–15.9)
IMMATURE GRANULOCYTES: 0 %
Immature Grans (Abs): 0 10*3/uL (ref 0.0–0.1)
LYMPHS ABS: 2 10*3/uL (ref 0.7–3.1)
Lymphs: 23 %
MCH: 30.7 pg (ref 26.6–33.0)
MCHC: 34 g/dL (ref 31.5–35.7)
MCV: 91 fL (ref 79–97)
MONOS ABS: 1.1 10*3/uL — AB (ref 0.1–0.9)
Monocytes: 13 %
NEUTROS PCT: 59 %
Neutrophils Absolute: 5.1 10*3/uL (ref 1.4–7.0)
PLATELETS: 265 10*3/uL (ref 150–450)
RBC: 4.72 x10E6/uL (ref 3.77–5.28)
RDW: 15 % (ref 12.3–15.4)
WBC: 8.7 10*3/uL (ref 3.4–10.8)

## 2018-09-20 LAB — COMPREHENSIVE METABOLIC PANEL
ALK PHOS: 94 IU/L (ref 39–117)
ALT: 14 IU/L (ref 0–32)
AST: 30 IU/L (ref 0–40)
Albumin/Globulin Ratio: 1.4 (ref 1.2–2.2)
Albumin: 4.3 g/dL (ref 3.5–4.8)
BUN/Creatinine Ratio: 8 — ABNORMAL LOW (ref 12–28)
BUN: 6 mg/dL — ABNORMAL LOW (ref 8–27)
Bilirubin Total: 0.5 mg/dL (ref 0.0–1.2)
CALCIUM: 9.8 mg/dL (ref 8.7–10.3)
CO2: 22 mmol/L (ref 20–29)
Chloride: 94 mmol/L — ABNORMAL LOW (ref 96–106)
Creatinine, Ser: 0.71 mg/dL (ref 0.57–1.00)
GFR calc Af Amer: 99 mL/min/{1.73_m2} (ref >59)
GFR, EST NON AFRICAN AMERICAN: 86 mL/min/{1.73_m2} (ref >59)
GLOBULIN, TOTAL: 3.1 g/dL (ref 1.5–4.5)
Glucose: 81 mg/dL (ref 65–99)
Potassium: 3.8 mmol/L (ref 3.5–5.2)
SODIUM: 136 mmol/L (ref 134–144)
Total Protein: 7.4 g/dL (ref 6.0–8.5)

## 2018-09-20 LAB — LIPID PANEL
CHOLESTEROL TOTAL: 162 mg/dL (ref 100–199)
Chol/HDL Ratio: 3.2 ratio (ref 0.0–4.4)
HDL: 51 mg/dL (ref >39)
LDL Calculated: 89 mg/dL (ref 0–99)
Triglycerides: 108 mg/dL (ref 0–149)
VLDL CHOLESTEROL CAL: 22 mg/dL (ref 5–40)

## 2018-09-20 LAB — HEMOGLOBIN A1C
ESTIMATED AVERAGE GLUCOSE: 105 mg/dL
HEMOGLOBIN A1C: 5.3 % (ref 4.8–5.6)

## 2018-09-20 LAB — TSH: TSH: 1.35 u[IU]/mL (ref 0.450–4.500)

## 2018-09-26 ENCOUNTER — Telehealth: Payer: Self-pay | Admitting: Internal Medicine

## 2018-09-26 ENCOUNTER — Telehealth: Payer: Self-pay | Admitting: Gastroenterology

## 2018-09-26 NOTE — Telephone Encounter (Signed)
Contacted [facility] to cancel pt procedure.

## 2018-09-26 NOTE — Telephone Encounter (Signed)
Patient would like to cancel her colonoscopy schedule for 10-03-18 with Dr Allen Norris. She is haveing some medical issues and wants to wait awhile.

## 2018-09-26 NOTE — Telephone Encounter (Signed)
Patient calling  States medicare has refused all the options patient was given to help quit smoking Patient would like to know if there are any other options, like maybe an outside clinic  Please call to discuss

## 2018-09-27 NOTE — Telephone Encounter (Signed)
Pt has been given # to [facility] quit [phone]. Nothing further needed.

## 2018-09-27 NOTE — Telephone Encounter (Signed)
Can give her the number for the [facility] quit classes.

## 2018-09-29 ENCOUNTER — Telehealth: Payer: Self-pay | Admitting: Internal Medicine

## 2018-09-29 DIAGNOSIS — J209 Acute bronchitis, unspecified: Secondary | ICD-10-CM

## 2018-09-29 DIAGNOSIS — R05 Cough: Secondary | ICD-10-CM

## 2018-09-29 DIAGNOSIS — J440 Chronic obstructive pulmonary disease with acute lower respiratory infection: Secondary | ICD-10-CM

## 2018-09-29 DIAGNOSIS — R059 Cough, unspecified: Secondary | ICD-10-CM

## 2018-09-29 NOTE — Telephone Encounter (Signed)
Patient calling States that she is having extreme pain across her back, thinks she may be getting pneumonia Please call to discuss

## 2018-09-29 NOTE — Telephone Encounter (Signed)
Returned call and advised that patient needs to have chest xray done. Spouse advised as pt was asleep. CXR ordered.

## 2018-09-30 ENCOUNTER — Ambulatory Visit (INDEPENDENT_AMBULATORY_CARE_PROVIDER_SITE_OTHER): Payer: Medicare Other | Admitting: Internal Medicine

## 2018-09-30 ENCOUNTER — Telehealth: Payer: Self-pay

## 2018-09-30 ENCOUNTER — Ambulatory Visit
Admission: RE | Admit: 2018-09-30 | Discharge: 2018-09-30 | Disposition: A | Discharge status: Home / Self Care | Payer: Medicare Other | Source: Ambulatory Visit | Attending: Internal Medicine | Admitting: Internal Medicine

## 2018-09-30 ENCOUNTER — Encounter: Payer: Self-pay | Admitting: Internal Medicine

## 2018-09-30 ENCOUNTER — Ambulatory Visit: Payer: Medicare Other | Admitting: Internal Medicine

## 2018-09-30 VITALS — BP 88/58 | HR 94 | Resp 16 | Ht 65.0 in | Wt 157.0 lb

## 2018-09-30 DIAGNOSIS — J209 Acute bronchitis, unspecified: Secondary | ICD-10-CM | POA: Insufficient documentation

## 2018-09-30 DIAGNOSIS — I25119 Atherosclerotic heart disease of native coronary artery with unspecified angina pectoris: Secondary | ICD-10-CM

## 2018-09-30 DIAGNOSIS — R918 Other nonspecific abnormal finding of lung field: Secondary | ICD-10-CM | POA: Insufficient documentation

## 2018-09-30 DIAGNOSIS — R05 Cough: Secondary | ICD-10-CM | POA: Diagnosis not present

## 2018-09-30 DIAGNOSIS — J440 Chronic obstructive pulmonary disease with acute lower respiratory infection: Secondary | ICD-10-CM | POA: Diagnosis not present

## 2018-09-30 DIAGNOSIS — R059 Cough, unspecified: Secondary | ICD-10-CM

## 2018-09-30 DIAGNOSIS — J181 Lobar pneumonia, unspecified organism: Secondary | ICD-10-CM | POA: Diagnosis not present

## 2018-09-30 DIAGNOSIS — F17210 Nicotine dependence, cigarettes, uncomplicated: Secondary | ICD-10-CM

## 2018-09-30 DIAGNOSIS — R0602 Shortness of breath: Secondary | ICD-10-CM | POA: Diagnosis not present

## 2018-09-30 MED ORDER — MOXIFLOXACIN HCL 400 MG PO TABS: 400.0000 mg | ORAL_TABLET | Freq: Every day | ORAL | 0 refills | Status: DC

## 2018-09-30 NOTE — Patient Instructions (Signed)
--  Quitting smoking is the most important thing that you can do for your health.  --Quitting smoking will have greater affect on your health than any medicine that we can give you.   Will give course of antibiotics.

## 2018-09-30 NOTE — Progress Notes (Deleted)
[facility] Consultation      Assessment and Plan:  Acute bronchitis with cough, dyspnea. - We will start nebulized DuoNeb 3 times daily. - We will check sputum culture.  Chronic bronchitis/COPD with emphysema. - Hyperinflation seen on chest x-ray suggestive of emphysema.  Will check a pulmonary function test once she has returned to baseline. - Continue trilogy inhaler. - Would consider lung cancer screening once the patient is more stable. Prevnar 01/02/16 PPSV 23 09/30/14  Nicotine abuse. - Discussed importance of smoke cessation, spent for medicine discussion. - We will send in prescription for nicotine patch.  No orders of the defined types were placed in this encounter.  No orders of the defined types were placed in this encounter.    Date: 09/30/2018  MRN# [id number] Kristi [name] [id number] 1946/01/23   Kristi [name] is a 72 y.o. old female seen in consultation for chief complaint of:    No chief complaint on file.   HPI:  The patient is a 72 year old female, she has a history of chronic bronchitis/COPD, she continues to have daily symptoms of dyspnea, acute bronchitis.  Last visit she was asked to continue Trelegy, she was started on duo nebs, we ordered a sputum culture which was not done.  I strongly advised smoking cessation.  She was tried on mucinex which helped thin the secretions out. She was started on Trelegy inhaler which once per day which she thinks helps for an hour or so.  She had cholecystomy about 2 weeks ago.  She feels that she has occasional sinus drainage, she denies reflux.  She has no pets at home.  She is smoking about half ppd, was about a ppd, she has been smoking about 40 years.   She has typically been very active, she golfs 9 holes about twice per week. In general she feels that her breathing has been stable over the past few years until this incident.    **Chest x-ray 08/12/2018>> hyperinflation suggestive of COPD,  elevated left diaphragm, changes of chronic bronchitis.  Imaging personally reviewed. **CBC 08/29/2018>> absolute eosinophil count 200  Medication:    Current Outpatient Medications:  .  albuterol (PROAIR HFA) 108 (90 Base) MCG/ACT inhaler, Inhale 2 puffs into the lungs every 6 (six) hours as needed for wheezing or shortness of breath., Disp: 18 g, Rfl: 2 .  AMBULATORY NON FORMULARY MEDICATION, Medication Name: Please provide patient with Nebulizer. DX: J44.9, Disp: 1 each, Rfl: 0 .  aspirin 81 MG chewable tablet, Chew 1 tablet (81 mg total) by mouth daily., Disp: 30 tablet, Rfl: 3 .  atorvastatin (LIPITOR) 80 MG tablet, Take 0.5 tablets (40 mg total) by mouth daily at 6 PM., Disp: 30 tablet, Rfl: 3 .  B Complex Vitamins (VITAMIN B COMPLEX PO), Take 1 tablet by mouth daily. , Disp: , Rfl:  .  CALCIUM PO, Take 1 tablet by mouth daily. , Disp: , Rfl:  .  cetirizine (ZYRTEC) 10 MG tablet, Take 10 mg by mouth daily., Disp: , Rfl:  .  Cholecalciferol (VITAMIN D) 2000 units CAPS, Take 1 capsule (2,000 Units total) by mouth daily., Disp: 30 capsule, Rfl:  .  docusate sodium (COLACE) 100 MG capsule, Take 100 mg by mouth daily., Disp: , Rfl:  .  Fluticasone-Umeclidin-Vilant (TRELEGY ELLIPTA) 100-62.5-25 MCG/INH AEPB, Inhale 1 puff into the lungs daily., Disp: 1 each, Rfl: 12 .  furosemide (LASIX) 20 MG tablet, Take 1 tablet (20 mg total) by mouth 2 (two) times daily. (Patient  taking differently: Take 20 mg by mouth as needed. ), Disp: 6 tablet, Rfl: 0 .  ipratropium-albuterol (DUONEB) 0.5-2.5 (3) MG/3ML SOLN, Take 3 mLs by nebulization 3 (three) times daily. DX. J44.9, Disp: 360 mL, Rfl: 1 .  metoprolol tartrate (LOPRESSOR) 25 MG tablet, Take 12.5 mg by mouth 2 (two) times daily., Disp: , Rfl:  .  nicotine (NICODERM CQ - DOSED IN MG/24 HOURS) 21 mg/24hr patch, Place 1 patch (21 mg total) onto the skin daily., Disp: 30 patch, Rfl: 1 .  nicotine polacrilex (NICORETTE) 2 MG gum, Take 1 each (2 mg total) by  mouth as needed for smoking cessation., Disp: 100 tablet, Rfl: 0 .  nitroGLYCERIN (NITROSTAT) 0.4 MG SL tablet, Place 1 tablet (0.4 mg total) under the tongue every 5 (five) minutes x 3 doses as needed for chest pain., Disp: 25 tablet, Rfl: 12 .  polyethylene glycol (MIRALAX / GLYCOLAX) packet, Take 17 g by mouth daily as needed., Disp: , Rfl:  .  silver sulfADIAZINE (SILVADENE) 1 % cream, Apply 1 application topically daily., Disp: 25 g, Rfl: 0   Allergies:  Patient has no known allergies.      LABORATORY PANEL:   CBC No results for input(s): WBC, HGB, HCT, PLT in the last 168 hours. ------------------------------------------------------------------------------------------------------------------  Chemistries  No results for input(s): NA, K, CL, CO2, GLUCOSE, BUN, CREATININE, CALCIUM, MG, AST, ALT, ALKPHOS, BILITOT in the last 168 hours.  Invalid input(s): GFRCGP ------------------------------------------------------------------------------------------------------------------  Cardiac Enzymes No results for input(s): TROPONINI in the last 168 hours. ------------------------------------------------------------  RADIOLOGY:  No results found.     Thank  you for the consultation and for allowing [facility] to assist in the care of your patient. Our recommendations are noted above.  Please contact us if we can be of further service.   Marda Stalker, M.D., F.C.C.P.  Board Certified in Internal Medicine, Pulmonary Medicine, [place], and Sleep Medicine.  [facility] Office Number: [phone]   09/30/2018

## 2018-09-30 NOTE — Telephone Encounter (Signed)
LM for patient and on Spouse's cell phones to make an apt after CXR today.

## 2018-09-30 NOTE — Progress Notes (Signed)
[facility] Consultation      Assessment and Plan:  Lobar pneumonia with acute dyspnea. - We will start course of antibiotics.  Chronic bronchitis/COPD with emphysema. - Hyperinflation seen on chest x-ray suggestive of emphysema.  Will check a pulmonary function test once she has returned to baseline. - Continue trilogy inhaler. - Would consider lung cancer screening once the patient is more stable. Prevnar 01/02/16 PPSV 23 09/30/14  Nicotine abuse. - She is down to about half a pack per day.  She is interested in quitting though she has several problems before she can do this. - Spent for medicine discussion.   Meds ordered this encounter  Medications  . moxifloxacin (AVELOX) 400 MG tablet    Sig: Take 1 tablet (400 mg total) by mouth daily at 8 pm.    Dispense:  10 tablet    Refill:  0     Date: 09/30/2018  MRN# [id number] Cassi Jenne PTWSFK 12/28/1945   ABRYANNA MUSOLINO is a 72 y.o. old female seen in consultation for chief complaint of:    Chief Complaint  Patient presents with  . Cough    clear mucus never resolved from Sept. She has been using nebs tid and inhaler.  . Back Pain    pt states back hurting around to her chest. Pt had cxr done this am.    HPI:  The patient is a 72 year old female, she has a history of chronic bronchitis/COPD, she continues to have daily symptoms of dyspnea, acute bronchitis.  Last visit she was asked to continue Trelegy, she was started on duo nebs, we ordered a sputum culture which was not done.  I strongly advised smoking cessation.  She is here as an acute visit because she started having pain in her left chest wall, radiating around to her back. The pain is made worse by movement, deep breaths.  She is taking Trelegy inhaler once daily, she is doing nebs three times daily. She has been coughing a lot so she started delsym which is helping.  She is smoking less than half ppd. She could not get the patch that was  ordered at last visit. She is thinking about starting on nicotine gum. She called the [facility] program but it was a 4 week program and she was not willing to commit.   She has no pets at home.  She is smoking about half ppd, was about a ppd, she has been smoking about 40 years.   She has typically been very active, she golfs 9 holes about twice per week. In general she feels that her breathing has been stable over the past few years until this incident.   **Chest x-ray the 09/30/2018>> images personally reviewed, significant consolidation in the left lung consistent with a lingular pneumonia **Chest x-ray 08/12/2018>> hyperinflation suggestive of COPD, elevated left diaphragm, changes of chronic bronchitis.  Imaging personally reviewed. **CBC 08/29/2018>> absolute eosinophil count 200  Medication:    Current Outpatient Medications:  .  albuterol (PROAIR HFA) 108 (90 Base) MCG/ACT inhaler, Inhale 2 puffs into the lungs every 6 (six) hours as needed for wheezing or shortness of breath., Disp: 18 g, Rfl: 2 .  AMBULATORY NON FORMULARY MEDICATION, Medication Name: Please provide patient with Nebulizer. DX: J44.9, Disp: 1 each, Rfl: 0 .  aspirin 81 MG chewable tablet, Chew 1 tablet (81 mg total) by mouth daily., Disp: 30 tablet, Rfl: 3 .  atorvastatin (LIPITOR) 80 MG tablet, Take 0.5 tablets (40 mg  total) by mouth daily at 6 PM., Disp: 30 tablet, Rfl: 3 .  B Complex Vitamins (VITAMIN B COMPLEX PO), Take 1 tablet by mouth daily. , Disp: , Rfl:  .  CALCIUM PO, Take 1 tablet by mouth daily. , Disp: , Rfl:  .  cetirizine (ZYRTEC) 10 MG tablet, Take 10 mg by mouth daily., Disp: , Rfl:  .  Cholecalciferol (VITAMIN D) 2000 units CAPS, Take 1 capsule (2,000 Units total) by mouth daily., Disp: 30 capsule, Rfl:  .  docusate sodium (COLACE) 100 MG capsule, Take 100 mg by mouth daily., Disp: , Rfl:  .  Fluticasone-Umeclidin-Vilant (TRELEGY ELLIPTA) 100-62.5-25 MCG/INH AEPB, Inhale 1 puff into the lungs daily.,  Disp: 1 each, Rfl: 12 .  ipratropium-albuterol (DUONEB) 0.5-2.5 (3) MG/3ML SOLN, Take 3 mLs by nebulization 3 (three) times daily. DX. J44.9, Disp: 360 mL, Rfl: 1 .  metoprolol tartrate (LOPRESSOR) 25 MG tablet, Take 12.5 mg by mouth 2 (two) times daily., Disp: , Rfl:  .  nitroGLYCERIN (NITROSTAT) 0.4 MG SL tablet, Place 1 tablet (0.4 mg total) under the tongue every 5 (five) minutes x 3 doses as needed for chest pain., Disp: 25 tablet, Rfl: 12 .  silver sulfADIAZINE (SILVADENE) 1 % cream, Apply 1 application topically daily., Disp: 25 g, Rfl: 0 .  nicotine polacrilex (NICORETTE) 2 MG gum, Take 1 each (2 mg total) by mouth as needed for smoking cessation. (Patient not taking: Reported on 09/30/2018), Disp: 100 tablet, Rfl: 0   Allergies:  Patient has no known allergies.  Review of Systems  Constitutional: Positive for chills and malaise/fatigue.  HENT: Negative for hearing loss and tinnitus.   Eyes: Negative for blurred vision and double vision.  Respiratory: Positive for cough and shortness of breath.   Cardiovascular: Negative for leg swelling.  Gastrointestinal: Negative for heartburn and nausea.  Genitourinary: Negative for dysuria and frequency.  Musculoskeletal: Negative for falls and joint pain.  Skin: Negative for itching and rash.  Neurological: Negative for seizures and loss of consciousness.  Endo/Heme/Allergies: Negative for environmental allergies and polydipsia.  Psychiatric/Behavioral: Negative for memory loss. The patient does not have insomnia.    Physical Examination:   VS: BP (!) 88/58 (BP Location: Left Arm, Cuff Size: Normal)   Pulse 94   Resp 16   Ht 5\' 5"  (1.651 m)   Wt 157 lb (71.2 kg)   SpO2 95%   BMI 26.13 kg/m   General Appearance: No distress  Neuro:without focal findings, mental status, speech normal, alert and oriented HEENT: PERRLA, EOM intact Pulmonary: Decreased air entry left lung. CardiovascularNormal S1,S2.  No m/r/g.  Abdomen: Benign, Soft,  non-tender, No masses Renal:  No costovertebral tenderness  GU:  No performed at this time. Endoc: No evident thyromegaly, no signs of acromegaly or Cushing features Skin:   warm, no rashes, no ecchymosis  Extremities: normal, no cyanosis, clubbing.      LABORATORY PANEL:   CBC No results for input(s): WBC, HGB, HCT, PLT in the last 168 hours. ------------------------------------------------------------------------------------------------------------------  Chemistries  No results for input(s): NA, K, CL, CO2, GLUCOSE, BUN, CREATININE, CALCIUM, MG, AST, ALT, ALKPHOS, BILITOT in the last 168 hours.  Invalid input(s): GFRCGP ------------------------------------------------------------------------------------------------------------------  Cardiac Enzymes No results for input(s): TROPONINI in the last 168 hours. ------------------------------------------------------------  RADIOLOGY:  No results found.     Thank  you for the consultation and for allowing [facility] to assist in the care of your patient. Our recommendations are noted above.  Please  contact us if we can be of further service.   Marda Stalker, M.D., F.C.C.P.  Board Certified in Internal Medicine, Pulmonary Medicine, [place], and Sleep Medicine.  [facility] Office Number: [phone]   09/30/2018

## 2018-10-14 ENCOUNTER — Telehealth: Payer: Self-pay | Admitting: Internal Medicine

## 2018-10-14 DIAGNOSIS — R059 Cough, unspecified: Secondary | ICD-10-CM

## 2018-10-14 DIAGNOSIS — R05 Cough: Secondary | ICD-10-CM

## 2018-10-14 NOTE — Telephone Encounter (Signed)
Patient calling stating she finished her antibiotics  States she's not doing much better would like some advise  She is not sure what to do, she is still coughing and not feeling well overall    Please call

## 2018-10-14 NOTE — Telephone Encounter (Signed)
Needs follow up with CXR before.

## 2018-10-17 ENCOUNTER — Ambulatory Visit
Admission: RE | Admit: 2018-10-17 | Discharge: 2018-10-17 | Disposition: A | Discharge status: Home / Self Care | Payer: Medicare Other | Source: Ambulatory Visit | Attending: Internal Medicine | Admitting: Internal Medicine

## 2018-10-17 DIAGNOSIS — R059 Cough, unspecified: Secondary | ICD-10-CM

## 2018-10-17 DIAGNOSIS — R05 Cough: Secondary | ICD-10-CM

## 2018-10-17 DIAGNOSIS — R918 Other nonspecific abnormal finding of lung field: Secondary | ICD-10-CM | POA: Insufficient documentation

## 2018-10-17 NOTE — Telephone Encounter (Signed)
appt scheduled. CXR entered.

## 2018-10-17 NOTE — H&P (View-Only) (Signed)
[name] Consultation      Assessment and Plan:  Non-resolving pneumonia/left lung atelectasis/possible lung mass. - Patient has progressive symptoms after 2 weeks of antibiotics with progressive radiological findings of left lung atelectasis.  I do not see significant evidence of pleural effusion.  She is at high risk for progression, including hospitalization. - We will order urgent CT of the chest with contrast, and plan for EBUS bronchoscopy later this week after the CAT scan is performed.  Chronic bronchitis, COPD/emphysema. - Hyperinflation seen on chest x-ray suggestive of emphysema.  Will check a pulmonary function test once she has returned to baseline. - Continue trilogy inhaler. - Would consider lung cancer screening once the patient is more stable. Prevnar 01/02/16 PPSV 23 09/30/14  Nicotine abuse. - Discussed importance of smoke cessation today, spent for medicine discussion.  She is somewhat scared by this experience and thinks that she can quit, she has stopped smoking for about the last 2 days.  Return in about 1 week (around 10/25/2018) for post bronchoscopy follow up. .     Date: 10/17/2018  MRN# [id number] Kristi [name] WEXHBZ October 13, 1946   Kristi [name] is a 72 y.o. old female seen in consultation for chief complaint of:    No chief complaint on file.   HPI:  The patient is a 72 year old female smoker with a history of COPD with chronic dyspnea on exertion, maintained on trilogy, DuoNeb's.  Last visit in early October patient was having a left-sided pleuritic chest pain, she was sent for a chest x-ray which showed a large lingular pneumonia, worrisome given its size.  She was given a course of antibiotics for 2 weeks and asked to repeat a chest x-ray. Today she notes that she has continued to cough, she notes that the breathing is better, she is not as winded. The left chest pain is better but in the interim she has started to develop trouble  swallowing.  She has not smoked in 2 days.   She has typically been very active, she golfs 9 holes about twice per week. In general she feels that her breathing has been stable over the past few years until this incident.   **Chest x-ray 10/12/2018 in comparison with previous chest x-ray the 09/30/2018>> images again personally reviewed.  In comparison with previous there is progressive left lung atelectasis with small effusion.  There is also leftward tracheal deviation consistent with atelectasis.  Significant consolidation in the left lung consistent with a lingular pneumonia **Chest x-ray 08/12/2018>> hyperinflation suggestive of COPD, elevated left diaphragm, changes of chronic bronchitis.  Imaging personally reviewed. **CBC 08/29/2018>> absolute eosinophil count 200  Medication:    Current Outpatient Medications:  .  albuterol (PROAIR HFA) 108 (90 Base) MCG/ACT inhaler, Inhale 2 puffs into the lungs every 6 (six) hours as needed for wheezing or shortness of breath., Disp: 18 g, Rfl: 2 .  AMBULATORY NON FORMULARY MEDICATION, Medication Name: Please provide patient with Nebulizer. DX: J44.9, Disp: 1 each, Rfl: 0 .  aspirin 81 MG chewable tablet, Chew 1 tablet (81 mg total) by mouth daily., Disp: 30 tablet, Rfl: 3 .  atorvastatin (LIPITOR) 80 MG tablet, Take 0.5 tablets (40 mg total) by mouth daily at 6 PM., Disp: 30 tablet, Rfl: 3 .  B Complex Vitamins (VITAMIN B COMPLEX PO), Take 1 tablet by mouth daily. , Disp: , Rfl:  .  CALCIUM PO, Take 1 tablet by mouth daily. , Disp: , Rfl:  .  cetirizine (ZYRTEC) 10  MG tablet, Take 10 mg by mouth daily., Disp: , Rfl:  .  Cholecalciferol (VITAMIN D) 2000 units CAPS, Take 1 capsule (2,000 Units total) by mouth daily., Disp: 30 capsule, Rfl:  .  docusate sodium (COLACE) 100 MG capsule, Take 100 mg by mouth daily., Disp: , Rfl:  .  Fluticasone-Umeclidin-Vilant (TRELEGY ELLIPTA) 100-62.5-25 MCG/INH AEPB, Inhale 1 puff into the lungs daily., Disp: 1 each, Rfl:  12 .  ipratropium-albuterol (DUONEB) 0.5-2.5 (3) MG/3ML SOLN, Take 3 mLs by nebulization 3 (three) times daily. DX. J44.9, Disp: 360 mL, Rfl: 1 .  metoprolol tartrate (LOPRESSOR) 25 MG tablet, Take 12.5 mg by mouth 2 (two) times daily., Disp: , Rfl:  .  moxifloxacin (AVELOX) 400 MG tablet, Take 1 tablet (400 mg total) by mouth daily at 8 pm., Disp: 10 tablet, Rfl: 0 .  nicotine polacrilex (NICORETTE) 2 MG gum, Take 1 each (2 mg total) by mouth as needed for smoking cessation. (Patient not taking: Reported on 09/30/2018), Disp: 100 tablet, Rfl: 0 .  nitroGLYCERIN (NITROSTAT) 0.4 MG SL tablet, Place 1 tablet (0.4 mg total) under the tongue every 5 (five) minutes x 3 doses as needed for chest pain., Disp: 25 tablet, Rfl: 12 .  silver sulfADIAZINE (SILVADENE) 1 % cream, Apply 1 application topically daily., Disp: 25 g, Rfl: 0   Allergies:  Patient has no known allergies.    Review of Systems:  Constitutional: Feels well. Cardiovascular: Denies chest pain, exertional chest pain.  Pulmonary: Denies hemoptysis, pleuritic chest pain.   The remainder of systems were reviewed and were found to be negative other than what is documented in the HPI.    Physical Examination:   VS: BP 110/60 (BP Location: Left Arm, Cuff Size: Normal)   Pulse 90   Resp 16   Ht 5\' 5"  (1.651 m)   Wt 151 lb (68.5 kg)   SpO2 95%   BMI 25.13 kg/m   General Appearance: No distress  Neuro:without focal findings, mental status, speech normal, alert and oriented HEENT: PERRLA, EOM intact Pulmonary: No wheezing, No rales decreased air entry left lung.  Dullness to Percussion left lung. CardiovascularNormal S1,S2.  No m/r/g.  Abdomen: Benign, Soft, non-tender, No masses Renal:  No costovertebral tenderness  GU:  No performed at this time. Endoc: No evident thyromegaly, no signs of acromegaly or Cushing features Skin:   warm, no rashes, no ecchymosis  Extremities: normal, no cyanosis, clubbing.      LABORATORY  PANEL:   CBC No results for input(s): WBC, HGB, HCT, PLT in the last 168 hours. ------------------------------------------------------------------------------------------------------------------  Chemistries  No results for input(s): NA, K, CL, CO2, GLUCOSE, BUN, CREATININE, CALCIUM, MG, AST, ALT, ALKPHOS, BILITOT in the last 168 hours.  Invalid input(s): GFRCGP ------------------------------------------------------------------------------------------------------------------  Cardiac Enzymes No results for input(s): TROPONINI in the last 168 hours. ------------------------------------------------------------  RADIOLOGY:  No results found.     Thank  you for the consultation and for allowing [facility] to assist in the care of your patient. Our recommendations are noted above.  Please contact us if we can be of further service.   Marda Stalker, M.D., F.C.C.P.  Board Certified in Internal Medicine, Pulmonary Medicine, [place], and Sleep Medicine.  [facility] Office Number: [phone]   10/17/2018

## 2018-10-17 NOTE — Progress Notes (Signed)
[facility] Consultation      Assessment and Plan:  Non-resolving pneumonia/left lung atelectasis/possible lung mass. - Patient has progressive symptoms after 2 weeks of antibiotics with progressive radiological findings of left lung atelectasis.  I do not see significant evidence of pleural effusion.  She is at high risk for progression, including hospitalization. - We will order urgent CT of the chest with contrast, and plan for EBUS bronchoscopy later this week after the CAT scan is performed.  Chronic bronchitis, COPD/emphysema. - Hyperinflation seen on chest x-ray suggestive of emphysema.  Will check a pulmonary function test once she has returned to baseline. - Continue trilogy inhaler. - Would consider lung cancer screening once the patient is more stable. Prevnar 01/02/16 PPSV 23 09/30/14  Nicotine abuse. - Discussed importance of smoke cessation today, spent for medicine discussion.  She is somewhat scared by this experience and thinks that she can quit, she has stopped smoking for about the last 2 days.  Return in about 1 week (around 10/25/2018) for post bronchoscopy follow up. .     Date: 10/17/2018  MRN# [id number] Seth Friedlander VZCHYI [birth year]   ARMINE RIZZOLO is a 72 y.o. old female seen in consultation for chief complaint of:    No chief complaint on file.   HPI:  The patient is a 72 year old female smoker with a history of COPD with chronic dyspnea on exertion, maintained on trilogy, DuoNeb's.  Last visit in early October patient was having a left-sided pleuritic chest pain, she was sent for a chest x-ray which showed a large lingular pneumonia, worrisome given its size.  She was given a course of antibiotics for 2 weeks and asked to repeat a chest x-ray. Today she notes that she has continued to cough, she notes that the breathing is better, she is not as winded. The left chest pain is better but in the interim she has started to develop trouble  swallowing.  She has not smoked in 2 days.   She has typically been very active, she golfs 9 holes about twice per week. In general she feels that her breathing has been stable over the past few years until this incident.   **Chest x-ray 10/12/2018 in comparison with previous chest x-ray the 09/30/2018>> images again personally reviewed.  In comparison with previous there is progressive left lung atelectasis with small effusion.  There is also leftward tracheal deviation consistent with atelectasis.  Significant consolidation in the left lung consistent with a lingular pneumonia **Chest x-ray 08/12/2018>> hyperinflation suggestive of COPD, elevated left diaphragm, changes of chronic bronchitis.  Imaging personally reviewed. **CBC 08/29/2018>> absolute eosinophil count 200  Medication:    Current Outpatient Medications:  .  albuterol (PROAIR HFA) 108 (90 Base) MCG/ACT inhaler, Inhale 2 puffs into the lungs every 6 (six) hours as needed for wheezing or shortness of breath., Disp: 18 g, Rfl: 2 .  AMBULATORY NON FORMULARY MEDICATION, Medication Name: Please provide patient with Nebulizer. DX: J44.9, Disp: 1 each, Rfl: 0 .  aspirin 81 MG chewable tablet, Chew 1 tablet (81 mg total) by mouth daily., Disp: 30 tablet, Rfl: 3 .  atorvastatin (LIPITOR) 80 MG tablet, Take 0.5 tablets (40 mg total) by mouth daily at 6 PM., Disp: 30 tablet, Rfl: 3 .  B Complex Vitamins (VITAMIN B COMPLEX PO), Take 1 tablet by mouth daily. , Disp: , Rfl:  .  CALCIUM PO, Take 1 tablet by mouth daily. , Disp: , Rfl:  .  cetirizine (ZYRTEC) 10  MG tablet, Take 10 mg by mouth daily., Disp: , Rfl:  .  Cholecalciferol (VITAMIN D) 2000 units CAPS, Take 1 capsule (2,000 Units total) by mouth daily., Disp: 30 capsule, Rfl:  .  docusate sodium (COLACE) 100 MG capsule, Take 100 mg by mouth daily., Disp: , Rfl:  .  Fluticasone-Umeclidin-Vilant (TRELEGY ELLIPTA) 100-62.5-25 MCG/INH AEPB, Inhale 1 puff into the lungs daily., Disp: 1 each, Rfl:  12 .  ipratropium-albuterol (DUONEB) 0.5-2.5 (3) MG/3ML SOLN, Take 3 mLs by nebulization 3 (three) times daily. DX. J44.9, Disp: 360 mL, Rfl: 1 .  metoprolol tartrate (LOPRESSOR) 25 MG tablet, Take 12.5 mg by mouth 2 (two) times daily., Disp: , Rfl:  .  moxifloxacin (AVELOX) 400 MG tablet, Take 1 tablet (400 mg total) by mouth daily at 8 pm., Disp: 10 tablet, Rfl: 0 .  nicotine polacrilex (NICORETTE) 2 MG gum, Take 1 each (2 mg total) by mouth as needed for smoking cessation. (Patient not taking: Reported on 09/30/2018), Disp: 100 tablet, Rfl: 0 .  nitroGLYCERIN (NITROSTAT) 0.4 MG SL tablet, Place 1 tablet (0.4 mg total) under the tongue every 5 (five) minutes x 3 doses as needed for chest pain., Disp: 25 tablet, Rfl: 12 .  silver sulfADIAZINE (SILVADENE) 1 % cream, Apply 1 application topically daily., Disp: 25 g, Rfl: 0   Allergies:  Patient has no known allergies.    Review of Systems:  Constitutional: Feels well. Cardiovascular: Denies chest pain, exertional chest pain.  Pulmonary: Denies hemoptysis, pleuritic chest pain.   The remainder of systems were reviewed and were found to be negative other than what is documented in the HPI.    Physical Examination:   VS: BP 110/60 (BP Location: Left Arm, Cuff Size: Normal)   Pulse 90   Resp 16   Ht 5\' 5"  (1.651 m)   Wt 151 lb (68.5 kg)   SpO2 95%   BMI 25.13 kg/m   General Appearance: No distress  Neuro:without focal findings, mental status, speech normal, alert and oriented HEENT: PERRLA, EOM intact Pulmonary: No wheezing, No rales decreased air entry left lung.  Dullness to Percussion left lung. CardiovascularNormal S1,S2.  No m/r/g.  Abdomen: Benign, Soft, non-tender, No masses Renal:  No costovertebral tenderness  GU:  No performed at this time. Endoc: No evident thyromegaly, no signs of acromegaly or Cushing features Skin:   warm, no rashes, no ecchymosis  Extremities: normal, no cyanosis, clubbing.      LABORATORY  PANEL:   CBC No results for input(s): WBC, HGB, HCT, PLT in the last 168 hours. ------------------------------------------------------------------------------------------------------------------  Chemistries  No results for input(s): NA, K, CL, CO2, GLUCOSE, BUN, CREATININE, CALCIUM, MG, AST, ALT, ALKPHOS, BILITOT in the last 168 hours.  Invalid input(s): GFRCGP ------------------------------------------------------------------------------------------------------------------  Cardiac Enzymes No results for input(s): TROPONINI in the last 168 hours. ------------------------------------------------------------  RADIOLOGY:  No results found.     Thank  you for the consultation and for allowing [facility] to assist in the care of your patient. Our recommendations are noted above.  Please contact us if we can be of further service.   Marda Stalker, M.D., F.C.C.P.  Board Certified in Internal Medicine, Pulmonary Medicine, [place], and Sleep Medicine.  [facility] Office Number: [phone]   10/17/2018

## 2018-10-18 ENCOUNTER — Ambulatory Visit (INDEPENDENT_AMBULATORY_CARE_PROVIDER_SITE_OTHER): Payer: Medicare Other | Admitting: Internal Medicine

## 2018-10-18 ENCOUNTER — Encounter: Payer: Self-pay | Admitting: Internal Medicine

## 2018-10-18 ENCOUNTER — Encounter
Admission: RE | Admit: 2018-10-18 | Discharge: 2018-10-18 | Disposition: A | Discharge status: Home / Self Care | Payer: Medicare Other | Source: Ambulatory Visit | Attending: Internal Medicine | Admitting: Internal Medicine

## 2018-10-18 ENCOUNTER — Other Ambulatory Visit: Payer: Self-pay

## 2018-10-18 VITALS — BP 110/60 | HR 90 | Resp 16 | Ht 65.0 in | Wt 151.0 lb

## 2018-10-18 DIAGNOSIS — R918 Other nonspecific abnormal finding of lung field: Secondary | ICD-10-CM | POA: Diagnosis not present

## 2018-10-18 DIAGNOSIS — R911 Solitary pulmonary nodule: Secondary | ICD-10-CM

## 2018-10-18 DIAGNOSIS — F17210 Nicotine dependence, cigarettes, uncomplicated: Secondary | ICD-10-CM

## 2018-10-18 DIAGNOSIS — I25119 Atherosclerotic heart disease of native coronary artery with unspecified angina pectoris: Secondary | ICD-10-CM

## 2018-10-18 NOTE — Patient Instructions (Signed)
Your procedure is scheduled on: Friday 10/21/18 Report to [place]. To find out your arrival time please call [phone] between 1PM - 3PM on Thursday 10/20/18.  Remember: Instructions that are not followed completely may result in serious medical risk, up to and including death, or upon the discretion of your surgeon and anesthesiologist your surgery may need to be rescheduled.     _X__ 1. Do not eat food after midnight the night before your procedure.                 No gum chewing or hard candies. You may drink clear liquids up to 2 hours                 before you are scheduled to arrive for your surgery- DO not drink clear                 liquids within 2 hours of the start of your surgery.                 Clear Liquids include:  water, apple juice without pulp, clear carbohydrate                 drink such as Clearfast or Gatorade, Black Coffee or Tea (Do not add                 anything to coffee or tea).  __X__2.  On the morning of surgery brush your teeth with toothpaste and water, you                 may rinse your mouth with mouthwash if you wish.  Do not swallow any              toothpaste of mouthwash.     _X__ 3.  No Alcohol for 24 hours before or after surgery.   _X__ 4.  Do Not Smoke or use e-cigarettes For 24 Hours Prior to Your Surgery.                 Do not use any chewable tobacco products for at least 6 hours prior to                 surgery.  ____  5.  Bring all medications with you on the day of surgery if instructed.   __X__  6.  Notify your doctor if there is any change in your medical condition      (cold, fever, infections).     Do not wear jewelry, make-up, hairpins, clips or nail polish. Do not wear lotions, powders, or perfumes.  Do not shave 48 hours prior to surgery. Men may shave face and neck. Do not bring valuables to the hospital.    [facility] is not responsible for any belongings or  valuables.  Contacts, dentures/partials or body piercings may not be worn into surgery. Bring a case for your contacts, glasses or hearing aids, a denture cup will be supplied. Leave your suitcase in the car. After surgery it may be brought to your room. For patients admitted to the hospital, discharge time is determined by your treatment team.   Patients discharged the day of surgery will not be allowed to drive home.   Please read over the following fact sheets that you were given:   MRSA Information  __X__ Take these medicines the morning of surgery with A SIP OF WATER:  1. cetirizine (ZYRTEC)  2. metoprolol tartrate (LOPRESSOR)  3.   4.  5.  6.  ____ Fleet Enema (as directed)   ____ Use CHG Soap/SAGE wipes as directed  __X__ Use inhalers on the day of surgery AND NEBULIZER BEFORE ARRIVAL  ____ Stop metformin/Janumet/Farxiga 2 days prior to surgery    ____ Take 1/2 of usual insulin dose the night before surgery. No insulin the morning          of surgery.   ____ Stop Blood Thinners Coumadin/Plavix/Xarelto/Pleta/Pradaxa/Eliquis/Effient/Aspirin  on   Or contact your Surgeon, Cardiologist or Medical Doctor regarding  ability to stop your blood thinners  __X__ Stop Anti-inflammatories 7 days before surgery such as Advil, Ibuprofen, Motrin,  BC or Goodies Powder, Naprosyn, Naproxen, Aleve, Aspirin (STOP TODAY)   __X__ Stop all herbal supplements, fish oil or vitamin E until after surgery.    ____ Bring C-Pap to the hospital.

## 2018-10-18 NOTE — Patient Instructions (Signed)
Will plan for CT chest.  Will tentatively plan for bronchoscopy in the next week depending upon results of the CT chest.

## 2018-10-20 ENCOUNTER — Ambulatory Visit
Admission: RE | Admit: 2018-10-20 | Discharge: 2018-10-20 | Disposition: A | Discharge status: Home / Self Care | Payer: Medicare Other | Source: Ambulatory Visit | Attending: Internal Medicine | Admitting: Internal Medicine

## 2018-10-20 ENCOUNTER — Telehealth: Payer: Self-pay | Admitting: Internal Medicine

## 2018-10-20 DIAGNOSIS — I2510 Atherosclerotic heart disease of native coronary artery without angina pectoris: Secondary | ICD-10-CM | POA: Diagnosis not present

## 2018-10-20 DIAGNOSIS — R9389 Abnormal findings on diagnostic imaging of other specified body structures: Secondary | ICD-10-CM | POA: Insufficient documentation

## 2018-10-20 DIAGNOSIS — I700 Atherosclerosis of aorta: Secondary | ICD-10-CM | POA: Diagnosis not present

## 2018-10-20 DIAGNOSIS — J90 Pleural effusion, not elsewhere classified: Secondary | ICD-10-CM | POA: Insufficient documentation

## 2018-10-20 DIAGNOSIS — R911 Solitary pulmonary nodule: Secondary | ICD-10-CM | POA: Insufficient documentation

## 2018-10-20 MED ORDER — IOHEXOL 300 MG/ML  SOLN
75.0000 mL | Freq: Once | INTRAMUSCULAR | Status: AC | PRN
  Administered 2018-10-20: 75 mL via INTRAVENOUS

## 2018-10-20 NOTE — Telephone Encounter (Signed)
Spoke to patient. Let her know that Dr. Juanell Fairly is out of the office today, but that I have forwarded her CT results to him. Advised her that she should still show up for the procedure tomorrow and they can discuss the CT results and if proceeding is the best course of action in the pre-op area. Patient is agreeable with no further questions at this time.

## 2018-10-20 NOTE — Telephone Encounter (Signed)
Patient calling back Would like to know based on the results whether or not she will be having the procedure tomorrow Please call to discuss

## 2018-10-20 NOTE — Telephone Encounter (Signed)
CT calling to let dr. Juanell Fairly know the study has been read.

## 2018-10-21 ENCOUNTER — Other Ambulatory Visit: Payer: Self-pay | Admitting: *Deleted

## 2018-10-21 ENCOUNTER — Other Ambulatory Visit: Payer: Self-pay

## 2018-10-21 ENCOUNTER — Encounter
Admission: RE | Disposition: A | Discharge status: Home / Self Care | Payer: Self-pay | Source: Ambulatory Visit | Attending: Internal Medicine

## 2018-10-21 ENCOUNTER — Ambulatory Visit
Admission: RE | Admit: 2018-10-21 | Discharge: 2018-10-21 | Disposition: A | Discharge status: Home / Self Care | Payer: Medicare Other | Source: Ambulatory Visit | Attending: Internal Medicine | Admitting: Internal Medicine

## 2018-10-21 ENCOUNTER — Ambulatory Visit: Payer: Medicare Other

## 2018-10-21 ENCOUNTER — Encounter: Payer: Self-pay | Admitting: Emergency Medicine

## 2018-10-21 ENCOUNTER — Ambulatory Visit: Payer: Medicare Other | Admitting: Certified Registered"

## 2018-10-21 DIAGNOSIS — E7800 Pure hypercholesterolemia, unspecified: Secondary | ICD-10-CM | POA: Insufficient documentation

## 2018-10-21 DIAGNOSIS — F17200 Nicotine dependence, unspecified, uncomplicated: Secondary | ICD-10-CM | POA: Insufficient documentation

## 2018-10-21 DIAGNOSIS — J441 Chronic obstructive pulmonary disease with (acute) exacerbation: Secondary | ICD-10-CM | POA: Diagnosis not present

## 2018-10-21 DIAGNOSIS — I10 Essential (primary) hypertension: Secondary | ICD-10-CM | POA: Diagnosis not present

## 2018-10-21 DIAGNOSIS — R918 Other nonspecific abnormal finding of lung field: Secondary | ICD-10-CM

## 2018-10-21 DIAGNOSIS — Z79899 Other long term (current) drug therapy: Secondary | ICD-10-CM | POA: Diagnosis not present

## 2018-10-21 DIAGNOSIS — R599 Enlarged lymph nodes, unspecified: Secondary | ICD-10-CM | POA: Insufficient documentation

## 2018-10-21 DIAGNOSIS — E232 Diabetes insipidus: Secondary | ICD-10-CM | POA: Diagnosis not present

## 2018-10-21 DIAGNOSIS — I2510 Atherosclerotic heart disease of native coronary artery without angina pectoris: Secondary | ICD-10-CM | POA: Diagnosis not present

## 2018-10-21 DIAGNOSIS — J189 Pneumonia, unspecified organism: Secondary | ICD-10-CM | POA: Diagnosis not present

## 2018-10-21 DIAGNOSIS — J449 Chronic obstructive pulmonary disease, unspecified: Secondary | ICD-10-CM | POA: Diagnosis not present

## 2018-10-21 DIAGNOSIS — C3402 Malignant neoplasm of left main bronchus: Secondary | ICD-10-CM | POA: Diagnosis not present

## 2018-10-21 DIAGNOSIS — C771 Secondary and unspecified malignant neoplasm of intrathoracic lymph nodes: Secondary | ICD-10-CM | POA: Diagnosis not present

## 2018-10-21 DIAGNOSIS — Z7982 Long term (current) use of aspirin: Secondary | ICD-10-CM | POA: Diagnosis not present

## 2018-10-21 DIAGNOSIS — Z9889 Other specified postprocedural states: Secondary | ICD-10-CM

## 2018-10-21 HISTORY — PX: ENDOBRONCHIAL ULTRASOUND: SHX5096

## 2018-10-21 HISTORY — DX: Claustrophobia: F40.240

## 2018-10-21 SURGERY — ENDOBRONCHIAL ULTRASOUND (EBUS)
Anesthesia: General

## 2018-10-21 MED ORDER — MIDAZOLAM HCL 2 MG/2ML IJ SOLN
INTRAMUSCULAR | Status: DC | PRN
  Administered 2018-10-21: 1 mg via INTRAVENOUS

## 2018-10-21 MED ORDER — HYDROCODONE-ACETAMINOPHEN 7.5-325 MG/15ML PO SOLN
10.0000 mL | Freq: Once | ORAL | Status: AC
  Administered 2018-10-21: 10 mL via ORAL
  Filled 2018-10-21: qty 15

## 2018-10-21 MED ORDER — BENZONATATE 100 MG PO CAPS: 200.0000 mg | ORAL_CAPSULE | Freq: Three times a day (TID) | ORAL | 2 refills | Status: DC

## 2018-10-21 MED ORDER — GLYCOPYRROLATE 0.2 MG/ML IJ SOLN
INTRAMUSCULAR | Status: AC
  Filled 2018-10-21: qty 1

## 2018-10-21 MED ORDER — FENTANYL CITRATE (PF) 100 MCG/2ML IJ SOLN
INTRAMUSCULAR | Status: DC | PRN
  Administered 2018-10-21 (×2): 50 ug via INTRAVENOUS

## 2018-10-21 MED ORDER — PROPOFOL 10 MG/ML IV BOLUS
INTRAVENOUS | Status: DC | PRN
  Administered 2018-10-21: 120 mg via INTRAVENOUS

## 2018-10-21 MED ORDER — SUGAMMADEX SODIUM 200 MG/2ML IV SOLN
INTRAVENOUS | Status: DC | PRN
  Administered 2018-10-21: 150 mg via INTRAVENOUS

## 2018-10-21 MED ORDER — HYDROCODONE-ACETAMINOPHEN 7.5-325 MG/15ML PO SOLN: 10.0000 mL | Freq: Every day | ORAL | 0 refills | Status: AC

## 2018-10-21 MED ORDER — PROPOFOL 500 MG/50ML IV EMUL
INTRAVENOUS | Status: DC | PRN
  Administered 2018-10-21: 120 ug/kg/min via INTRAVENOUS

## 2018-10-21 MED ORDER — LIDOCAINE HCL (CARDIAC) PF 100 MG/5ML IV SOSY
PREFILLED_SYRINGE | INTRAVENOUS | Status: DC | PRN
  Administered 2018-10-21: 50 mg via INTRAVENOUS

## 2018-10-21 MED ORDER — ACETAMINOPHEN 325 MG PO TABS
ORAL_TABLET | ORAL | Status: AC
  Filled 2018-10-21: qty 2

## 2018-10-21 MED ORDER — ONDANSETRON HCL 4 MG/2ML IJ SOLN
INTRAMUSCULAR | Status: AC
  Filled 2018-10-21: qty 2

## 2018-10-21 MED ORDER — SUCCINYLCHOLINE CHLORIDE 20 MG/ML IJ SOLN
INTRAMUSCULAR | Status: AC
  Filled 2018-10-21: qty 1

## 2018-10-21 MED ORDER — MIDAZOLAM HCL 2 MG/2ML IJ SOLN
INTRAMUSCULAR | Status: AC
  Filled 2018-10-21: qty 2

## 2018-10-21 MED ORDER — FENTANYL CITRATE (PF) 100 MCG/2ML IJ SOLN
INTRAMUSCULAR | Status: AC
  Filled 2018-10-21: qty 2

## 2018-10-21 MED ORDER — DEXAMETHASONE SODIUM PHOSPHATE 10 MG/ML IJ SOLN
INTRAMUSCULAR | Status: AC
  Filled 2018-10-21: qty 1

## 2018-10-21 MED ORDER — LACTATED RINGERS IV SOLN
INTRAVENOUS | Status: DC
  Administered 2018-10-21: 13:00:00 via INTRAVENOUS

## 2018-10-21 MED ORDER — ROCURONIUM BROMIDE 50 MG/5ML IV SOLN
INTRAVENOUS | Status: AC
  Filled 2018-10-21: qty 1

## 2018-10-21 MED ORDER — ONDANSETRON HCL 4 MG/2ML IJ SOLN
INTRAMUSCULAR | Status: DC | PRN
  Administered 2018-10-21: 4 mg via INTRAVENOUS

## 2018-10-21 MED ORDER — ROCURONIUM BROMIDE 100 MG/10ML IV SOLN
INTRAVENOUS | Status: DC | PRN
  Administered 2018-10-21: 5 mg via INTRAVENOUS
  Administered 2018-10-21: 20 mg via INTRAVENOUS
  Administered 2018-10-21: 5 mg via INTRAVENOUS

## 2018-10-21 MED ORDER — PROPOFOL 10 MG/ML IV BOLUS
INTRAVENOUS | Status: AC
  Filled 2018-10-21: qty 20

## 2018-10-21 MED ORDER — SUGAMMADEX SODIUM 200 MG/2ML IV SOLN
INTRAVENOUS | Status: AC
  Filled 2018-10-21: qty 2

## 2018-10-21 MED ORDER — LIDOCAINE HCL (PF) 1 % IJ SOLN
INTRAMUSCULAR | Status: AC
  Filled 2018-10-21: qty 5

## 2018-10-21 MED ORDER — FAMOTIDINE 20 MG PO TABS
ORAL_TABLET | ORAL | Status: AC
  Administered 2018-10-21: 20 mg via ORAL
  Filled 2018-10-21: qty 1

## 2018-10-21 MED ORDER — SUCCINYLCHOLINE CHLORIDE 20 MG/ML IJ SOLN
INTRAMUSCULAR | Status: DC | PRN
  Administered 2018-10-21: 80 mg via INTRAVENOUS

## 2018-10-21 MED ORDER — BENZONATATE 100 MG PO CAPS
200.0000 mg | ORAL_CAPSULE | Freq: Once | ORAL | Status: AC
  Administered 2018-10-21: 200 mg via ORAL
  Filled 2018-10-21: qty 2

## 2018-10-21 MED ORDER — DEXAMETHASONE SODIUM PHOSPHATE 10 MG/ML IJ SOLN
INTRAMUSCULAR | Status: DC | PRN
  Administered 2018-10-21: 10 mg via INTRAVENOUS

## 2018-10-21 MED ORDER — FENTANYL CITRATE (PF) 100 MCG/2ML IJ SOLN: 25.0000 ug | INTRAMUSCULAR | Status: DC | PRN

## 2018-10-21 MED ORDER — FAMOTIDINE 20 MG PO TABS
20.0000 mg | ORAL_TABLET | Freq: Once | ORAL | Status: AC
  Administered 2018-10-21: 20 mg via ORAL

## 2018-10-21 MED ORDER — ACETAMINOPHEN 325 MG PO TABS
650.0000 mg | ORAL_TABLET | Freq: Once | ORAL | Status: AC | PRN
  Administered 2018-10-21: 650 mg via ORAL

## 2018-10-21 MED ORDER — PHENYLEPHRINE HCL 10 MG/ML IJ SOLN
INTRAMUSCULAR | Status: DC | PRN
  Administered 2018-10-21: 100 ug via INTRAVENOUS
  Administered 2018-10-21 (×2): 150 ug via INTRAVENOUS
  Administered 2018-10-21 (×3): 100 ug via INTRAVENOUS

## 2018-10-21 MED ORDER — GLYCOPYRROLATE 0.2 MG/ML IJ SOLN
INTRAMUSCULAR | Status: DC | PRN
  Administered 2018-10-21: 0.2 mg via INTRAVENOUS

## 2018-10-21 MED ORDER — PROPOFOL 10 MG/ML IV BOLUS
INTRAVENOUS | Status: AC
  Filled 2018-10-21: qty 40

## 2018-10-21 NOTE — Anesthesia Post-op Follow-up Note (Signed)
Anesthesia QCDR form completed.        

## 2018-10-21 NOTE — Anesthesia Preprocedure Evaluation (Signed)
Anesthesia Evaluation  Patient identified by MRN, date of birth, ID band Patient awake    Reviewed: Allergy & Precautions, H&P , NPO status , Patient's Chart, lab work & pertinent test results  Airway Mallampati: III  TM Distance: >3 FB Neck ROM: full    Dental  (+) Teeth Intact   Pulmonary neg pulmonary ROS, shortness of breath, COPD, former smoker,           Cardiovascular hypertension, + angina + CAD and + Past MI  negative cardio ROS       Neuro/Psych Anxiety negative neurological ROS  negative psych ROS   GI/Hepatic negative GI ROS, Neg liver ROS,   Endo/Other  negative endocrine ROS  Renal/GU      Musculoskeletal  (+) Arthritis ,   Abdominal   Peds  Hematology negative hematology ROS (+)   Anesthesia Other Findings Past Medical History: 09/30/2014: Acute MI, inferoposterior wall (HCC) No date: Claustrophobia No date: Coronary artery disease 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[place]) No date: Hypercholesteremia No date: Hypertension 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, old  Past Surgical History: No date: APPENDECTOMY     Comment:  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 No date: BLADDER NECK SUSPENSION 08/14/2018: CHOLECYSTECTOMY; N/A     Comment:  Procedure: LAPAROSCOPIC CHOLECYSTECTOMY;  Surgeon:               Jules Husbands, MD;  Location: [facility] ORS;  Service:               General;  Laterality: N/A; No date: CORONARY STENT PLACEMENT No date: HERNIA REPAIR 09/29/2014: LEFT HEART CATHETERIZATION WITH CORONARY ANGIOGRAM; N/A     Comment:  Procedure: LEFT HEART CATHETERIZATION WITH CORONARY               ANGIOGRAM;  Surgeon: Clent Demark, MD;  Location: [place]               CATH LAB;  Service: Cardiovascular;  Laterality: N/A;  BMI    Body Mass Index:  25.13 kg/m      Reproductive/Obstetrics negative OB ROS                             Anesthesia Physical Anesthesia Plan  ASA: III  Anesthesia Plan: General  ETT   Post-op Pain Management:    Induction:   PONV Risk Score and Plan: Ondansetron and Dexamethasone  Airway Management Planned:   Additional Equipment:   Intra-op Plan:   Post-operative Plan:   Informed Consent: I have reviewed the patients History and Physical, chart, labs and discussed the procedure including the risks, benefits and alternatives for the proposed anesthesia with the patient or authorized representative who has indicated his/her understanding and acceptance.   Dental Advisory Given  Plan Discussed with: Anesthesiologist, CRNA and Surgeon  Anesthesia Plan Comments:         Anesthesia Quick Evaluation

## 2018-10-21 NOTE — Interval H&P Note (Signed)
History and Physical Interval Note:  10/21/2018 12:52 PM  Kristi [name] [facility]  has presented today for surgery, with the diagnosis of LUNG MASS  The various methods of treatment have been discussed with the patient and family. After consideration of risks, benefits and other options for treatment, the patient has consented to  Procedure(s): ENDOBRONCHIAL ULTRASOUND (N/A) as a surgical intervention .  The patient's history has been reviewed, patient examined, no change in status, stable for surgery.  I have reviewed the patient's chart and labs.  Questions were answered to the patient's satisfaction.     Laverle Hobby

## 2018-10-21 NOTE — Op Note (Signed)
[facility]            Bronchoscopy Note   FINDINGS/SUMMARY:   -Complete occlusion of the distal left mainstem bronchus due to external compression, and mucosal infiltration likely by tumor.  Transbronchial forceps biopsies were taken from this location under fluoroscopic guidance.  Endobronchial brush, and bronchoalveolar lavage were also taken from this location.  A catheter could not be advanced distal to the obstruction, there was a small opening seen to the left upper lobe. - EBUS guided lymph node needle biopsy taken at the upper right paratracheal node, lower right paratracheal node, subcarinal node, left hilar mass. - Copious mucosal secretions bilaterally which were suctioned.  There was narrowing of the right mainstem bronchus due to external compression, no evidence of endobronchial lesion were seen.  Indication: Left hilar mass. The patient (or their representative) was informed of the risks (including but not limited to bleeding, infection, respiratory failure, lung injury, tooth/oral injury) and benefits of the procedure and gave consent, see chart.   Pre-op diagnosis: Left hilar mass. Post-op diagnosis: Same with left mainstem obstruction. Estimated blood loss: 15 cc.  Medications for procedure: Please see anesthesia notes.  Procedure description:  After obtaining informed consent a time was called to confirm the patient the procedure.  Patient was intubated by anesthesia services, please see their note for further sedation details.  The EBUS scope was passed to the trachea, the right lower paratracheal lymph node station was examined this was a large lymph node/mass, 3 passes were taken with good returns.  The bronchoscope was then taken to the left upper paratracheal lymph node station, where additional mass/lymph node was noted.  3 passes were taken with good returns.  The bronchoscope was then taken to the right mainstem and the subcarinal lymph  node/mass was visualized, 3 passes were taken with good returns.  The bronchoscope was then taken to the left mainstem, the scope could not be passed to the distal mainstem, endobronchial ultrasound-guided visualization showed large left paratracheal mass, needle biopsies were taken with good returns. The EBUS scope was then removed, the white light bronchoscope was then passed, an anatomical tour was taken of the right lung.  There was significant extrinsic compression seen throughout the right lung, there was moderate mucosal secretions which were suctioned, no endobronchial lesions were noted, no other abnormalities were noted in the right lung.  The bronchoscope was then taken to the left lung. In the left lung there was complete occlusion due to external compression at the distal left mainstem.  The left lower lobe/bronchus intermedius could not be visualized due to occlusion, the scope could not be passed nor could instruments be passed to that area.  Upon passing the forceps under fluoroscopic guidance the forceps catheter could be directed into the left upper lobe but not into the left lower lobe. The field was visualized by presence of extrinsic compression as well as blood and secretions.  Therefore fluoroscopic guidance was used to pass to the obstructed area, for transbronchial biopsies were taken under fluoroscopic guidance.  In addition a BAL was performed in this area as well as cytology brushings.  As adequate samples have been taken at that time, the white light bronchoscope was then removed.    Condition post procedure:stable.     Complications: Increased left lung opacity noted.    Marda Stalker, M.D., F.C.C.P. Board Certified in Internal Medicine, Pulmonary Medicine, [place], and Sleep Medicine.  [facility] Office Number:  [phone]  10/21/2018  

## 2018-10-21 NOTE — Transfer of Care (Signed)
Immediate Anesthesia Transfer of Care Note  Patient: Kristi [name] [facility]  Procedure(s) Performed: ENDOBRONCHIAL ULTRASOUND (N/A )  Patient Location: PACU  Anesthesia Type:General  Level of Consciousness: awake  Airway & Oxygen Therapy: Patient Spontanous Breathing and Patient connected to face mask oxygen  Post-op Assessment: Report given to RN and Post -op Vital signs reviewed and stable  Post vital signs: Reviewed  Last Vitals:  Vitals Value Taken Time  BP 129/97 10/21/2018  2:44 PM  Temp    Pulse 101 10/21/2018  2:44 PM  Resp 20 10/21/2018  2:44 PM  SpO2 96 % 10/21/2018  2:44 PM  Vitals shown include unvalidated device data.  Last Pain:  Vitals:   10/21/18 1304  TempSrc: Tympanic  PainSc: 0-No pain         Complications: No apparent anesthesia complications

## 2018-10-21 NOTE — Discharge Instructions (Signed)

## 2018-10-21 NOTE — Anesthesia Procedure Notes (Signed)
Procedure Name: Intubation Performed by: Rolla Plate, CRNA Pre-anesthesia Checklist: Patient identified, Patient being monitored, Timeout performed, Emergency Drugs available and Suction available Patient Re-evaluated:Patient Re-evaluated prior to induction Oxygen Delivery Method: Circle system utilized Preoxygenation: Pre-oxygenation with 100% oxygen Induction Type: IV induction Ventilation: Mask ventilation without difficulty Laryngoscope Size: 2 and Miller Grade View: Grade I Tube type: Oral Tube size: 8.5 mm Number of attempts: 1 Airway Equipment and Method: Stylet Placement Confirmation: ETT inserted through vocal cords under direct vision,  positive ETCO2 and breath sounds checked- equal and bilateral Secured at: 20 cm Tube secured with: Tape Dental Injury: Teeth and Oropharynx as per pre-operative assessment

## 2018-10-23 LAB — ACID FAST SMEAR (AFB): ACID FAST SMEAR - AFSCU2: NEGATIVE

## 2018-10-23 LAB — ACID FAST SMEAR (AFB, MYCOBACTERIA)

## 2018-10-24 ENCOUNTER — Encounter: Payer: Self-pay | Admitting: Internal Medicine

## 2018-10-24 ENCOUNTER — Other Ambulatory Visit: Payer: Self-pay | Admitting: Internal Medicine

## 2018-10-24 ENCOUNTER — Encounter: Payer: Self-pay | Admitting: *Deleted

## 2018-10-24 ENCOUNTER — Ambulatory Visit: Admit: 2018-10-24 | Payer: Medicare Other | Admitting: Gastroenterology

## 2018-10-24 DIAGNOSIS — R918 Other nonspecific abnormal finding of lung field: Secondary | ICD-10-CM

## 2018-10-24 LAB — CULTURE, BAL-QUANTITATIVE W GRAM STAIN

## 2018-10-24 LAB — CULTURE, BAL-QUANTITATIVE

## 2018-10-24 SURGERY — COLONOSCOPY WITH PROPOFOL
Anesthesia: General

## 2018-10-24 MED ORDER — AZITHROMYCIN 250 MG PO TABS: 250.0000 mg | ORAL_TABLET | Freq: Every day | ORAL | 0 refills | Status: DC

## 2018-10-25 LAB — CYTOLOGY - NON PAP

## 2018-10-25 LAB — SURGICAL PATHOLOGY

## 2018-10-25 NOTE — Anesthesia Postprocedure Evaluation (Signed)
Anesthesia Post Note  Patient: Kristi [name] [facility]  Procedure(s) Performed: ENDOBRONCHIAL ULTRASOUND (N/A )  Patient location during evaluation: PACU Anesthesia Type: General Level of consciousness: awake and alert Pain management: pain level controlled Vital Signs Assessment: post-procedure vital signs reviewed and stable Respiratory status: spontaneous breathing, nonlabored ventilation and respiratory function stable Cardiovascular status: blood pressure returned to baseline and stable Postop Assessment: no apparent nausea or vomiting Anesthetic complications: no     Last Vitals:  Vitals:   10/21/18 1604 10/21/18 1622  BP: 119/73 115/66  Pulse: 86   Resp: 18 18  Temp: 36.6 C   SpO2: 99% 98%    Last Pain:  Vitals:   10/21/18 1622  TempSrc:   PainSc: [place]

## 2018-10-25 NOTE — Progress Notes (Signed)
  Oncology Nurse Navigator Documentation  Navigator Location: [facility] (10/24/18 0800) Referral date to RadOnc/MedOnc: 10/21/18 (10/24/18 0800) )Navigator Encounter Type: Introductory phone call (10/24/18 0800)   Abnormal Finding Date: 10/20/18 (10/24/18 0800)                   Treatment Phase: Abnormal Scans (10/24/18 0800) Barriers/Navigation Needs: Coordination of Care (10/24/18 0800)   Interventions: Coordination of Care (10/24/18 0800)   Coordination of Care: Appts;Radiology (10/24/18 0800)        Acuity: Level 2 (10/24/18 0800)   Acuity Level 2: Initial guidance, education and coordination as needed;Educational needs;Assistance expediting appointments (10/24/18 0800)    phone call made to patient to introduce to navigator services and to review upcoming appts. All questions answered at the time of phone call. Pt informed that is scheduled for PET scan on 11/6 at 11:30am with 11am arrival at the medical mall. Prep instructions given. Informed pt that will see Dr. Tasia Catchings in [place] on Thursday at 9:30am to discuss results from biopsy as well as PET scan. Contact info given and instructed to call with any further questions or needs. Pt verbalized understanding. Nothing further needed at this time. Time Spent with Patient: 30 (10/24/18 0800)

## 2018-10-26 ENCOUNTER — Encounter
Admission: RE | Admit: 2018-10-26 | Discharge: 2018-10-26 | Disposition: A | Discharge status: Home / Self Care | Payer: Medicare Other | Source: Ambulatory Visit | Attending: Oncology | Admitting: Oncology

## 2018-10-26 ENCOUNTER — Encounter: Payer: Self-pay | Admitting: Internal Medicine

## 2018-10-26 ENCOUNTER — Ambulatory Visit (INDEPENDENT_AMBULATORY_CARE_PROVIDER_SITE_OTHER): Payer: Medicare Other | Admitting: Internal Medicine

## 2018-10-26 VITALS — BP 92/60 | HR 93 | Resp 16 | Ht 65.0 in | Wt 150.0 lb

## 2018-10-26 DIAGNOSIS — I25119 Atherosclerotic heart disease of native coronary artery with unspecified angina pectoris: Secondary | ICD-10-CM | POA: Diagnosis not present

## 2018-10-26 DIAGNOSIS — R918 Other nonspecific abnormal finding of lung field: Secondary | ICD-10-CM

## 2018-10-26 DIAGNOSIS — J181 Lobar pneumonia, unspecified organism: Secondary | ICD-10-CM | POA: Diagnosis not present

## 2018-10-26 DIAGNOSIS — R590 Localized enlarged lymph nodes: Secondary | ICD-10-CM | POA: Diagnosis not present

## 2018-10-26 LAB — GLUCOSE, CAPILLARY: Glucose-Capillary: 91 mg/dL (ref 70–99)

## 2018-10-26 MED ORDER — FLUDEOXYGLUCOSE F - 18 (FDG) INJECTION
7.8000 | Freq: Once | INTRAVENOUS | Status: AC | PRN
  Administered 2018-10-26: 8.25 via INTRAVENOUS

## 2018-10-26 NOTE — Progress Notes (Signed)
[facility] Consultation      Assessment and Plan:  Small cell lung cancer. -Status post EBUS bronchoscopy 10/21/2018. - Results relayed to patient. - Patient has appointment to see oncology tomorrow  Chronic bronchitis, COPD/emphysema. - Hyperinflation seen on chest x-ray suggestive of emphysema. - Continue trilogy inhaler.  Albuterol MDI. Prevnar 01/02/16 PPSV 23 09/30/14  Dysphagia. - Patient is developing dysphasia, I suspect this is secondary to her bulky lymphadenopathy with esophageal compression. - Patient is asked to avoid thick, dry or tough foods such as beef.  She is encouraged to take in soft solids and liquids.  Nicotine abuse. - She quit smoking little over a week ago, she is current encouraged not to restart.  Return in about 3 months (around 01/26/2019).     Date: 10/26/2018  MRN# [id number] Kristi [name] [id number] 1946/02/22   Kristi [name] is a 72 y.o. old female seen in consultation for chief complaint of:    Chief Complaint  Patient presents with  . Follow-up    pt here for f/u results of bronch:pt started on abx that was sent in yesterday.  [name] Kitchen Dysphagia    pt not able to eat much  . Shortness of Breath    with exertion and qhs  . Fatigue    all day  . Cough    better at night now but cough all day.    HPI:  The patient is a 72 year old female smoker, she was initially seen due to non-resolving pneumonia on the left side.  Substernally underwent CT chest which showed a left large perihilar mass.  She then underwent bronchoscopy on 10/21/2018.  Showed complete occlusion of the left mainstem bronchus due to external compression and likely tumor infiltration.  Biopsies of the left mainstem were positive for small cell lung cancer, as were EBUS guided needle biopsies of subcarinal, right paratracheal, right upper paratracheal nodal stations. Before the pneumonia the patient had been very active, she golfed 9 holes about twice per week in  general felt that her breathing has been stable over the past few years until this incident.  Currently she is taking Trelegy daily, she takes albuterol MDI three times daily.  She is having trouble swallowing which has been progressive.  She last smoked about a week ago.   **Chest x-ray 10/12/2018 in comparison with previous chest x-ray the 09/30/2018>> images again personally reviewed.  In comparison with previous there is progressive left lung atelectasis with small effusion.  There is also leftward tracheal deviation consistent with atelectasis.  Significant consolidation in the left lung consistent with a lingular pneumonia **Chest x-ray 08/12/2018>> hyperinflation suggestive of COPD, elevated left diaphragm, changes of chronic bronchitis.  Imaging personally reviewed. **CBC 08/29/2018>> absolute eosinophil count 200  Medication:    Current Outpatient Medications:  .  albuterol (PROAIR HFA) 108 (90 Base) MCG/ACT inhaler, Inhale 2 puffs into the lungs every 6 (six) hours as needed for wheezing or shortness of breath., Disp: 18 g, Rfl: 2 .  AMBULATORY NON FORMULARY MEDICATION, Medication Name: Please provide patient with Nebulizer. DX: J44.9, Disp: 1 each, Rfl: 0 .  aspirin EC 81 MG tablet, Take 81 mg by mouth daily., Disp: , Rfl:  .  atorvastatin (LIPITOR) 80 MG tablet, Take 0.5 tablets (40 mg total) by mouth daily at 6 PM. (Patient taking differently: Take 80 mg by mouth at bedtime. ), Disp: 30 tablet, Rfl: 3 .  azithromycin (ZITHROMAX) 250 MG tablet, Take 1 tablet (250 mg total) by mouth  daily. Take 2 tablets the first day, then once daily until gone., Disp: 6 tablet, Rfl: 0 .  B Complex Vitamins (VITAMIN B COMPLEX PO), Take 1 tablet by mouth daily. , Disp: , Rfl:  .  benzonatate (TESSALON PERLES) 100 MG capsule, Take 2 capsules (200 mg total) by mouth 3 (three) times daily., Disp: 90 capsule, Rfl: 2 .  CALCIUM PO, Take 1 tablet by mouth daily. , Disp: , Rfl:  .  cetirizine (ZYRTEC) 10 MG  tablet, Take 10 mg by mouth daily., Disp: , Rfl:  .  Cholecalciferol (VITAMIN D) 2000 units CAPS, Take 1 capsule (2,000 Units total) by mouth daily., Disp: 30 capsule, Rfl:  .  docusate sodium (COLACE) 100 MG capsule, Take 100 mg by mouth daily., Disp: , Rfl:  .  Fluticasone-Umeclidin-Vilant (TRELEGY ELLIPTA) 100-62.5-25 MCG/INH AEPB, Inhale 1 puff into the lungs daily., Disp: 1 each, Rfl: 12 .  HYDROcodone-acetaminophen (HYCET) 7.5-325 mg/15 ml solution, Take 10 mLs by mouth at bedtime for 14 days., Disp: 120 mL, Rfl: 0 .  ipratropium-albuterol (DUONEB) 0.5-2.5 (3) MG/3ML SOLN, Take 3 mLs by nebulization 3 (three) times daily. DX. J44.9, Disp: 360 mL, Rfl: 1 .  metoprolol tartrate (LOPRESSOR) 25 MG tablet, Take 12.5 mg by mouth 2 (two) times daily., Disp: , Rfl:  .  nitroGLYCERIN (NITROSTAT) 0.4 MG SL tablet, Place 1 tablet (0.4 mg total) under the tongue every 5 (five) minutes x 3 doses as needed for chest pain., Disp: 25 tablet, Rfl: 12 .  silver sulfADIAZINE (SILVADENE) 1 % cream, Apply 1 application topically daily., Disp: 25 g, Rfl: 0   Allergies:  Patient has no known allergies.          LABORATORY PANEL:   CBC No results for input(s): WBC, HGB, HCT, PLT in the last 168 hours. ------------------------------------------------------------------------------------------------------------------  Chemistries  No results for input(s): NA, K, CL, CO2, GLUCOSE, BUN, CREATININE, CALCIUM, MG, AST, ALT, ALKPHOS, BILITOT in the last 168 hours.  Invalid input(s): GFRCGP ------------------------------------------------------------------------------------------------------------------  Cardiac Enzymes No results for input(s): TROPONINI in the last 168 hours. ------------------------------------------------------------  RADIOLOGY:  No results found.     Thank  you for the consultation and for allowing [facility] to assist in the care of your patient. Our  recommendations are noted above.  Please contact us if we can be of further service.   Marda Stalker, M.D., F.C.C.P.  Board Certified in Internal Medicine, Pulmonary Medicine, [place], and Sleep Medicine.  [facility] Office Number: [phone]   10/26/2018

## 2018-10-26 NOTE — Patient Instructions (Signed)
Try to avoid chewy and dry foods. Increase intake of soft solid foods and liquids.  Make sure you chew your food very well before swallowing.  That your doctors know about your swallowing and if it is getting worse.

## 2018-10-27 ENCOUNTER — Telehealth: Payer: Self-pay | Admitting: *Deleted

## 2018-10-27 ENCOUNTER — Encounter: Payer: Self-pay | Admitting: Oncology

## 2018-10-27 ENCOUNTER — Inpatient Hospital Stay: Payer: Medicare Other | Attending: Oncology | Admitting: Oncology

## 2018-10-27 ENCOUNTER — Other Ambulatory Visit: Payer: Self-pay | Admitting: Oncology

## 2018-10-27 ENCOUNTER — Other Ambulatory Visit: Payer: Self-pay | Admitting: *Deleted

## 2018-10-27 ENCOUNTER — Encounter: Payer: Self-pay | Admitting: *Deleted

## 2018-10-27 VITALS — BP 95/66 | HR 95 | Temp 96.7°F | Resp 20 | Ht 65.0 in | Wt 148.0 lb

## 2018-10-27 DIAGNOSIS — C3402 Malignant neoplasm of left main bronchus: Secondary | ICD-10-CM

## 2018-10-27 DIAGNOSIS — Z9981 Dependence on supplemental oxygen: Secondary | ICD-10-CM | POA: Diagnosis not present

## 2018-10-27 DIAGNOSIS — J90 Pleural effusion, not elsewhere classified: Secondary | ICD-10-CM | POA: Insufficient documentation

## 2018-10-27 DIAGNOSIS — R630 Anorexia: Secondary | ICD-10-CM | POA: Diagnosis not present

## 2018-10-27 DIAGNOSIS — J9610 Chronic respiratory failure, unspecified whether with hypoxia or hypercapnia: Secondary | ICD-10-CM | POA: Insufficient documentation

## 2018-10-27 DIAGNOSIS — R1310 Dysphagia, unspecified: Secondary | ICD-10-CM | POA: Diagnosis not present

## 2018-10-27 DIAGNOSIS — Z5189 Encounter for other specified aftercare: Secondary | ICD-10-CM | POA: Diagnosis not present

## 2018-10-27 DIAGNOSIS — Z800 Family history of malignant neoplasm of digestive organs: Secondary | ICD-10-CM

## 2018-10-27 DIAGNOSIS — R059 Cough, unspecified: Secondary | ICD-10-CM

## 2018-10-27 DIAGNOSIS — D649 Anemia, unspecified: Secondary | ICD-10-CM | POA: Insufficient documentation

## 2018-10-27 DIAGNOSIS — R5383 Other fatigue: Secondary | ICD-10-CM | POA: Diagnosis not present

## 2018-10-27 DIAGNOSIS — E871 Hypo-osmolality and hyponatremia: Secondary | ICD-10-CM | POA: Insufficient documentation

## 2018-10-27 DIAGNOSIS — J439 Emphysema, unspecified: Secondary | ICD-10-CM | POA: Insufficient documentation

## 2018-10-27 DIAGNOSIS — Z87891 Personal history of nicotine dependence: Secondary | ICD-10-CM | POA: Diagnosis not present

## 2018-10-27 DIAGNOSIS — R05 Cough: Secondary | ICD-10-CM

## 2018-10-27 DIAGNOSIS — G893 Neoplasm related pain (acute) (chronic): Secondary | ICD-10-CM | POA: Diagnosis not present

## 2018-10-27 DIAGNOSIS — Z7189 Other specified counseling: Secondary | ICD-10-CM

## 2018-10-27 DIAGNOSIS — Z79899 Other long term (current) drug therapy: Secondary | ICD-10-CM | POA: Insufficient documentation

## 2018-10-27 DIAGNOSIS — C3490 Malignant neoplasm of unspecified part of unspecified bronchus or lung: Secondary | ICD-10-CM | POA: Insufficient documentation

## 2018-10-27 DIAGNOSIS — Z5111 Encounter for antineoplastic chemotherapy: Secondary | ICD-10-CM | POA: Diagnosis not present

## 2018-10-27 DIAGNOSIS — R0602 Shortness of breath: Secondary | ICD-10-CM

## 2018-10-27 DIAGNOSIS — E876 Hypokalemia: Secondary | ICD-10-CM | POA: Insufficient documentation

## 2018-10-27 HISTORY — DX: Malignant neoplasm of unspecified part of unspecified bronchus or lung: C34.90

## 2018-10-27 MED ORDER — PROCHLORPERAZINE MALEATE 10 MG PO TABS: 10.0000 mg | ORAL_TABLET | Freq: Four times a day (QID) | ORAL | 1 refills | Status: DC | PRN

## 2018-10-27 MED ORDER — ALPRAZOLAM 0.5 MG PO TABS: ORAL_TABLET | ORAL | 0 refills | Status: DC

## 2018-10-27 MED ORDER — ONDANSETRON HCL 8 MG PO TABS: 8.0000 mg | ORAL_TABLET | Freq: Two times a day (BID) | ORAL | 1 refills | Status: DC | PRN

## 2018-10-27 MED ORDER — LIDOCAINE-PRILOCAINE 2.5-2.5 % EX CREA: TOPICAL_CREAM | CUTANEOUS | 3 refills | Status: DC

## 2018-10-27 MED ORDER — CEFAZOLIN SODIUM-DEXTROSE 2-4 GM/100ML-% IV SOLN
2.0000 g | INTRAVENOUS | Status: AC
  Administered 2018-10-28: 2 g via INTRAVENOUS

## 2018-10-27 NOTE — Telephone Encounter (Signed)
Patient's husband called the office back wanting to get port placement scheduled for tomorrow.   Dr. Collie Siad office would like to start treatments on Tuesday per Dr. Dahlia Byes.   Patient's surgery has been scheduled for 10-28-18 at [facility] with Dr. Dahlia Byes. History and physical will be updated day of procedure.   The patient is scheduled for an MRI tomorrow and surgery will be after MRI.   Patient to check in at admitting after MRI is completed.   Husband is aware that patient will need to be NPO after midnight and that she will need a driver.  The patient's husband is aware to call the office should they have further questions.

## 2018-10-27 NOTE — Progress Notes (Signed)
  Oncology Nurse Navigator Documentation  Navigator Location: [facility] (10/27/18 1300)   )Navigator Encounter Type: Initial MedOnc (10/27/18 1300)     Confirmed Diagnosis Date: 10/25/18 (10/27/18 1300)                 Treatment Phase: Pre-Tx/Tx Discussion (10/27/18 1300) Barriers/Navigation Needs: Education;Coordination of Care (10/27/18 1300) Education: Understanding Cancer/ Treatment Options;Newly Diagnosed Cancer Education (10/27/18 1300) Interventions: Coordination of Care;Referrals;Education (10/27/18 1300) Referrals: Nutrition/dietician;Palliative Care (10/27/18 1300) Coordination of Care: Appts;Radiology;Chemo (10/27/18 1300) Education Method: Verbal;Written (10/27/18 1300)       met with patient and her family to discuss results and treatment options. All questions answered at the time of visit. Pt given resources regarding diagnosis and supportive services available. Reviewed upcoming appts. Pt informed that will be contacted by surgeon's office with appt for port placement. Contact info given and instructed to call with any further questions or needs. Pt and family verbalized understanding.         Time Spent with Patient: 60 (10/27/18 1300)

## 2018-10-27 NOTE — Addendum Note (Signed)
Addended by: Caroleen Hamman F on: 10/27/2018 06:27 PM   Modules accepted: Orders, SmartSet

## 2018-10-27 NOTE — Progress Notes (Signed)
Patient here today as a new patient with lung mass.

## 2018-10-27 NOTE — Progress Notes (Signed)
START ON PATHWAY REGIMEN - Small Cell Lung     Cycles 1 through 4, every 21 days:     Atezolizumab      Carboplatin      Etoposide    Cycles 5 and beyond, every 21 days:     Atezolizumab   **Always confirm dose/schedule in your pharmacy ordering system**  Patient Characteristics: Newly Diagnosed, Preoperative or Nonsurgical Candidate (Clinical Staging), First Line, Extensive Stage Therapeutic Status: Newly Diagnosed, Preoperative or Nonsurgical Candidate (Clinical Staging) AJCC T Category: cT4 AJCC N Category: cN3 AJCC M Category: cM1 AJCC 8 Stage Grouping: IV Stage Classification: Extensive Intent of Therapy: Non-Curative / Palliative Intent, Discussed with Patient

## 2018-10-27 NOTE — Telephone Encounter (Signed)
Message left on cell phone for patient to call the office.  We need to get port placement scheduled with Dr. Dahlia Byes for Friday or Monday.

## 2018-10-27 NOTE — Progress Notes (Signed)
Completed a 6 minute walk with the patient.  Patient Saturations on Room Air at Rest = 95%; Patient Saturations on Room Air while Ambulating = 77%.  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 Patients Saturations ambulating on 2lt of oxygen = 90% - 92%.

## 2018-10-28 ENCOUNTER — Encounter: Payer: Self-pay | Admitting: *Deleted

## 2018-10-28 ENCOUNTER — Other Ambulatory Visit: Payer: Self-pay

## 2018-10-28 ENCOUNTER — Ambulatory Visit: Payer: Medicare Other

## 2018-10-28 ENCOUNTER — Telehealth: Payer: Self-pay | Admitting: *Deleted

## 2018-10-28 ENCOUNTER — Encounter
Admission: RE | Disposition: A | Discharge status: Home / Self Care | Payer: Self-pay | Source: Ambulatory Visit | Attending: Surgery

## 2018-10-28 ENCOUNTER — Ambulatory Visit: Payer: Medicare Other | Admitting: Anesthesiology

## 2018-10-28 ENCOUNTER — Ambulatory Visit
Admission: RE | Admit: 2018-10-28 | Discharge: 2018-10-28 | Disposition: A | Discharge status: Home / Self Care | Payer: Medicare Other | Source: Ambulatory Visit | Attending: Surgery | Admitting: Surgery

## 2018-10-28 ENCOUNTER — Ambulatory Visit
Admission: RE | Admit: 2018-10-28 | Discharge: 2018-10-28 | Disposition: A | Discharge status: Home / Self Care | Payer: Medicare Other | Source: Ambulatory Visit | Attending: Oncology | Admitting: Oncology

## 2018-10-28 DIAGNOSIS — Z79899 Other long term (current) drug therapy: Secondary | ICD-10-CM | POA: Insufficient documentation

## 2018-10-28 DIAGNOSIS — R1310 Dysphagia, unspecified: Secondary | ICD-10-CM | POA: Insufficient documentation

## 2018-10-28 DIAGNOSIS — C3491 Malignant neoplasm of unspecified part of right bronchus or lung: Secondary | ICD-10-CM | POA: Diagnosis not present

## 2018-10-28 DIAGNOSIS — E7800 Pure hypercholesterolemia, unspecified: Secondary | ICD-10-CM | POA: Insufficient documentation

## 2018-10-28 DIAGNOSIS — Z7982 Long term (current) use of aspirin: Secondary | ICD-10-CM | POA: Insufficient documentation

## 2018-10-28 DIAGNOSIS — Z87891 Personal history of nicotine dependence: Secondary | ICD-10-CM | POA: Diagnosis not present

## 2018-10-28 DIAGNOSIS — R630 Anorexia: Secondary | ICD-10-CM | POA: Insufficient documentation

## 2018-10-28 DIAGNOSIS — I252 Old myocardial infarction: Secondary | ICD-10-CM | POA: Diagnosis not present

## 2018-10-28 DIAGNOSIS — C3490 Malignant neoplasm of unspecified part of unspecified bronchus or lung: Secondary | ICD-10-CM

## 2018-10-28 DIAGNOSIS — Z09 Encounter for follow-up examination after completed treatment for conditions other than malignant neoplasm: Secondary | ICD-10-CM

## 2018-10-28 DIAGNOSIS — F419 Anxiety disorder, unspecified: Secondary | ICD-10-CM | POA: Diagnosis not present

## 2018-10-28 DIAGNOSIS — I10 Essential (primary) hypertension: Secondary | ICD-10-CM | POA: Insufficient documentation

## 2018-10-28 DIAGNOSIS — Z7951 Long term (current) use of inhaled steroids: Secondary | ICD-10-CM | POA: Insufficient documentation

## 2018-10-28 DIAGNOSIS — J441 Chronic obstructive pulmonary disease with (acute) exacerbation: Secondary | ICD-10-CM | POA: Diagnosis not present

## 2018-10-28 DIAGNOSIS — R059 Cough, unspecified: Secondary | ICD-10-CM | POA: Insufficient documentation

## 2018-10-28 DIAGNOSIS — Z800 Family history of malignant neoplasm of digestive organs: Secondary | ICD-10-CM | POA: Diagnosis not present

## 2018-10-28 DIAGNOSIS — Z955 Presence of coronary angioplasty implant and graft: Secondary | ICD-10-CM | POA: Diagnosis not present

## 2018-10-28 DIAGNOSIS — I2510 Atherosclerotic heart disease of native coronary artery without angina pectoris: Secondary | ICD-10-CM | POA: Insufficient documentation

## 2018-10-28 DIAGNOSIS — R05 Cough: Secondary | ICD-10-CM | POA: Insufficient documentation

## 2018-10-28 DIAGNOSIS — Z4682 Encounter for fitting and adjustment of non-vascular catheter: Secondary | ICD-10-CM | POA: Diagnosis not present

## 2018-10-28 DIAGNOSIS — R918 Other nonspecific abnormal finding of lung field: Secondary | ICD-10-CM | POA: Diagnosis not present

## 2018-10-28 DIAGNOSIS — Z7189 Other specified counseling: Secondary | ICD-10-CM | POA: Insufficient documentation

## 2018-10-28 HISTORY — PX: PORTACATH PLACEMENT: SHX2246

## 2018-10-28 SURGERY — INSERTION, TUNNELED CENTRAL VENOUS DEVICE, WITH PORT
Anesthesia: General | Site: Chest | Laterality: Right

## 2018-10-28 MED ORDER — CHLORHEXIDINE GLUCONATE CLOTH 2 % EX PADS: 6.0000 | MEDICATED_PAD | Freq: Once | CUTANEOUS | Status: DC

## 2018-10-28 MED ORDER — LIDOCAINE HCL (PF) 1 % IJ SOLN
INTRAMUSCULAR | Status: AC
  Filled 2018-10-28: qty 30

## 2018-10-28 MED ORDER — FENTANYL CITRATE (PF) 100 MCG/2ML IJ SOLN: 25.0000 ug | INTRAMUSCULAR | Status: DC | PRN

## 2018-10-28 MED ORDER — LIDOCAINE HCL (PF) 1 % IJ SOLN
INTRAMUSCULAR | Status: DC | PRN
  Administered 2018-10-28: 5 mL

## 2018-10-28 MED ORDER — GADOBUTROL 1 MMOL/ML IV SOLN
6.0000 mL | Freq: Once | INTRAVENOUS | Status: AC | PRN
  Administered 2018-10-28: 6 mL via INTRAVENOUS

## 2018-10-28 MED ORDER — IPRATROPIUM-ALBUTEROL 0.5-2.5 (3) MG/3ML IN SOLN: 3.0000 mL | RESPIRATORY_TRACT | Status: DC

## 2018-10-28 MED ORDER — MEPERIDINE HCL 50 MG/ML IJ SOLN: 6.2500 mg | INTRAMUSCULAR | Status: DC | PRN

## 2018-10-28 MED ORDER — SODIUM CHLORIDE 0.9 % IV SOLN
INTRAVENOUS | Status: DC | PRN
  Administered 2018-10-28: 15:00:00 via INTRAMUSCULAR

## 2018-10-28 MED ORDER — PROMETHAZINE HCL 25 MG/ML IJ SOLN: 6.2500 mg | INTRAMUSCULAR | Status: DC | PRN

## 2018-10-28 MED ORDER — BUPIVACAINE-EPINEPHRINE (PF) 0.25% -1:200000 IJ SOLN
INTRAMUSCULAR | Status: DC | PRN
  Administered 2018-10-28: 5 mL via PERINEURAL

## 2018-10-28 MED ORDER — OXYCODONE HCL 5 MG/5ML PO SOLN: 5.0000 mg | Freq: Once | ORAL | Status: DC | PRN

## 2018-10-28 MED ORDER — MIDAZOLAM HCL 2 MG/2ML IJ SOLN
INTRAMUSCULAR | Status: AC
  Filled 2018-10-28: qty 2

## 2018-10-28 MED ORDER — CEFAZOLIN SODIUM-DEXTROSE 2-4 GM/100ML-% IV SOLN
INTRAVENOUS | Status: AC
  Filled 2018-10-28: qty 100

## 2018-10-28 MED ORDER — MIDAZOLAM HCL 2 MG/2ML IJ SOLN
INTRAMUSCULAR | Status: DC | PRN
  Administered 2018-10-28 (×2): 1 mg via INTRAVENOUS

## 2018-10-28 MED ORDER — IPRATROPIUM-ALBUTEROL 0.5-2.5 (3) MG/3ML IN SOLN
RESPIRATORY_TRACT | Status: AC
  Administered 2018-10-28: 3 mL
  Filled 2018-10-28: qty 3

## 2018-10-28 MED ORDER — LACTATED RINGERS IV SOLN
INTRAVENOUS | Status: DC
  Administered 2018-10-28: 12:00:00 via INTRAVENOUS

## 2018-10-28 MED ORDER — HEPARIN SODIUM (PORCINE) 5000 UNIT/ML IJ SOLN
INTRAMUSCULAR | Status: AC
  Filled 2018-10-28: qty 1

## 2018-10-28 MED ORDER — OXYCODONE HCL 5 MG PO TABS: 5.0000 mg | ORAL_TABLET | Freq: Once | ORAL | Status: DC | PRN

## 2018-10-28 MED ORDER — PROPOFOL 500 MG/50ML IV EMUL
INTRAVENOUS | Status: AC
  Filled 2018-10-28: qty 50

## 2018-10-28 MED ORDER — PROPOFOL 500 MG/50ML IV EMUL
INTRAVENOUS | Status: DC | PRN
  Administered 2018-10-28: 125 ug/kg/min via INTRAVENOUS

## 2018-10-28 MED ORDER — HYDROCODONE-ACETAMINOPHEN 5-325 MG PO TABS: 1.0000 | ORAL_TABLET | Freq: Four times a day (QID) | ORAL | 0 refills | Status: DC | PRN

## 2018-10-28 MED ORDER — BUPIVACAINE-EPINEPHRINE (PF) 0.25% -1:200000 IJ SOLN
INTRAMUSCULAR | Status: AC
  Filled 2018-10-28: qty 30

## 2018-10-28 MED ORDER — PROPOFOL 10 MG/ML IV BOLUS
INTRAVENOUS | Status: DC | PRN
  Administered 2018-10-28: 30 mg via INTRAVENOUS
  Administered 2018-10-28: 20 mg via INTRAVENOUS

## 2018-10-28 SURGICAL SUPPLY — 34 items
BAG DECANTER FOR FLEXI CONT (MISCELLANEOUS) ×3 IMPLANT
BLADE SURG SZ11 CARB STEEL (BLADE) ×3 IMPLANT
BOOT SUTURE AID YELLOW STND (SUTURE) ×3 IMPLANT
CANISTER SUCT 1200ML W/VALVE (MISCELLANEOUS) ×3 IMPLANT
CHLORAPREP W/TINT 26ML (MISCELLANEOUS) ×3 IMPLANT
COVER LIGHT HANDLE STERIS (MISCELLANEOUS) ×6 IMPLANT
COVER WAND RF STERILE (DRAPES) ×3 IMPLANT
DERMABOND ADVANCED (GAUZE/BANDAGES/DRESSINGS) ×2
DERMABOND ADVANCED .7 DNX12 (GAUZE/BANDAGES/DRESSINGS) ×1 IMPLANT
DRAPE C-ARM XRAY 36X54 (DRAPES) ×3 IMPLANT
DRAPE INCISE IOBAN 66X45 STRL (DRAPES) ×3 IMPLANT
DRAPE SHEET LG 3/4 BI-LAMINATE (DRAPES) ×3 IMPLANT
ELECT REM PT RETURN 9FT ADLT (ELECTROSURGICAL) ×3
ELECTRODE REM PT RTRN 9FT ADLT (ELECTROSURGICAL) ×1 IMPLANT
GEL ULTRASOUND 20GR AQUASONIC (MISCELLANEOUS) ×3 IMPLANT
GLOVE BIO SURGEON STRL SZ7 (GLOVE) ×3 IMPLANT
GOWN STRL REUS W/ TWL LRG LVL3 (GOWN DISPOSABLE) ×2 IMPLANT
GOWN STRL REUS W/TWL LRG LVL3 (GOWN DISPOSABLE) ×4
IV NS 500ML (IV SOLUTION) ×2
IV NS 500ML BAXH (IV SOLUTION) ×1 IMPLANT
KIT PORT POWER 8FR ISP CVUE (Port) ×3 IMPLANT
NEEDLE HYPO 22GX1.5 SAFETY (NEEDLE) ×3 IMPLANT
NS IRRIG 1000ML POUR BTL (IV SOLUTION) ×3 IMPLANT
PACK PORT-A-CATH (MISCELLANEOUS) ×3 IMPLANT
SPONGE LAP 18X18 RF (DISPOSABLE) ×3 IMPLANT
SUT MNCRL AB 4-0 PS2 18 (SUTURE) ×3 IMPLANT
SUT PROLENE 2-0 (SUTURE) ×2
SUT PROLENE 2-0 RB1 36X2 ARM (SUTURE) ×1
SUT VIC AB 3-0 SH 27 (SUTURE) ×2
SUT VIC AB 3-0 SH 27X BRD (SUTURE) ×1 IMPLANT
SUTURE PROLEN 2-0 RB1 36X2 ARM (SUTURE) ×1 IMPLANT
SYR 20CC LL (SYRINGE) ×3 IMPLANT
SYR 5ML LL (SYRINGE) ×3 IMPLANT
TOWEL OR 17X26 4PK STRL BLUE (TOWEL DISPOSABLE) ×3 IMPLANT

## 2018-10-28 NOTE — Anesthesia Preprocedure Evaluation (Signed)
Anesthesia Evaluation  Patient identified by MRN, date of birth, ID band Patient awake    Reviewed: Allergy & Precautions, NPO status , Patient's Chart, lab work & pertinent test results  History of Anesthesia Complications Negative for: history of anesthetic complications  Airway Mallampati: II  TM Distance: >3 FB Neck ROM: Full    Dental  (+) Upper Dentures, Lower Dentures   Pulmonary neg sleep apnea, COPD,  COPD inhaler, former smoker,    breath sounds clear to auscultation- rhonchi (-) wheezing      Cardiovascular hypertension, + CAD, + Past MI and + Cardiac Stents (2015)   Rhythm:Regular Rate:Normal - Systolic murmurs and - Diastolic murmurs    Neuro/Psych Anxiety negative neurological ROS     GI/Hepatic negative GI ROS, Neg liver ROS,   Endo/Other  negative endocrine ROSneg diabetes  Renal/GU negative Renal ROS     Musculoskeletal  (+) Arthritis ,   Abdominal (+) - obese,   Peds  Hematology negative hematology ROS (+)   Anesthesia Other Findings Past Medical History: 09/30/2014: Acute MI, inferoposterior wall (HCC) No date: Claustrophobia No date: Coronary artery disease No date: Hypercholesteremia No date: Hypertension 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, old 10/27/2018: 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 [facility])   Reproductive/Obstetrics                             Anesthesia Physical Anesthesia Plan  ASA: III  Anesthesia Plan: General   Post-op Pain Management:    Induction: Intravenous  PONV Risk Score and Plan: 2 and Propofol infusion  Airway Management Planned: Natural Airway  Additional Equipment:   Intra-op Plan:   Post-operative Plan:   Informed Consent: I have reviewed the patients History and Physical, chart, labs and discussed the procedure including the risks, benefits and alternatives for the proposed anesthesia with the patient or authorized representative who has indicated  his/her understanding and acceptance.   Dental advisory given  Plan Discussed with: CRNA and Anesthesiologist  Anesthesia Plan Comments:         Anesthesia Quick Evaluation

## 2018-10-28 NOTE — Anesthesia Procedure Notes (Signed)
Date/Time: 10/28/2018 2:40 PM Performed by: Nelda Marseille, CRNA Pre-anesthesia Checklist: Patient identified, Emergency Drugs available, Suction available, Patient being monitored and Timeout performed Oxygen Delivery Method: Nasal cannula

## 2018-10-28 NOTE — Discharge Instructions (Addendum)
Implanted Port Home Guide °An implanted port is a type of central line that is placed under the skin. Central lines are used to provide IV access when treatment or nutrition needs to be given through a person’s veins. Implanted ports are used for long-term IV access. An implanted port may be placed because: °· You need IV medicine that would be irritating to the small veins in your hands or arms. °· You need long-term IV medicines, such as antibiotics. °· You need IV nutrition for a long period. °· You need frequent blood draws for lab tests. °· You need dialysis. ° °Implanted ports are usually placed in the chest area, but they can also be placed in the upper arm, the abdomen, or the leg. An implanted port has two main parts: °· Reservoir. The reservoir is round and will appear as a small, raised area under your skin. The reservoir is the part where a needle is inserted to give medicines or draw blood. °· Catheter. The catheter is a thin, flexible tube that extends from the reservoir. The catheter is placed into a large vein. Medicine that is inserted into the reservoir goes into the catheter and then into the vein. ° °How will I care for my incision site? °Do not get the incision site wet. Bathe or shower as directed by your health care provider. °How is my port accessed? °Special steps must be taken to access the port: °· Before the port is accessed, a numbing cream can be placed on the skin. This helps numb the skin over the port site. °· Your health care provider uses a sterile technique to access the port. °? Your health care provider must put on a mask and sterile gloves. °? The skin over your port is cleaned carefully with an antiseptic and allowed to dry. °? The port is gently pinched between sterile gloves, and a needle is inserted into the port. °· Only "non-coring" port needles should be used to access the port. Once the port is accessed, a blood return should be checked. This helps ensure that the port  is in the vein and is not clogged. °· If your port needs to remain accessed for a constant infusion, a clear (transparent) bandage will be placed over the needle site. The bandage and needle will need to be changed every week, or as directed by your health care provider. °· Keep the bandage covering the needle clean and dry. Do not get it wet. Follow your health care provider’s instructions on how to take a shower or bath while the port is accessed. °· If your port does not need to stay accessed, no bandage is needed over the port. ° °What is flushing? °Flushing helps keep the port from getting clogged. Follow your health care provider’s instructions on how and when to flush the port. Ports are usually flushed with saline solution or a medicine called heparin. The need for flushing will depend on how the port is used. °· If the port is used for intermittent medicines or blood draws, the port will need to be flushed: °? After medicines have been given. °? After blood has been drawn. °? As part of routine maintenance. °· If a constant infusion is running, the port may not need to be flushed. ° °How long will my port stay implanted? °The port can stay in for as long as your health care provider thinks it is needed. When it is time for the port to come out, surgery will be   done to remove it. The procedure is similar to the one performed when the port was put in. When should I seek immediate medical care? When you have an implanted port, you should seek immediate medical care if:  You notice a bad smell coming from the incision site.  You have swelling, redness, or drainage at the incision site.  You have more swelling or pain at the port site or the surrounding area.  You have a fever that is not controlled with medicine.  This information is not intended to replace advice given to you by your health care provider. Make sure you discuss any questions you have with your health care provider. Document  Released: 12/07/2005 Document Revised: 05/14/2016 Document Reviewed: 08/14/2013 Elsevier Interactive Patient Education  2017 [employer]   1) The drugs that you were given will stay in your system until tomorrow so for the next 24 hours you should not:  A) Drive an automobile B) Make any legal decisions C) Drink any alcoholic beverage   2) You may resume regular meals tomorrow.  Today it is better to start with liquids and gradually work up to solid foods.  You may eat anything you prefer, but it is better to start with liquids, then soup and crackers, and gradually work up to solid foods.   3) Please notify your doctor immediately if you have any unusual bleeding, trouble breathing, redness and pain at the surgery site, drainage, fever, or pain not relieved by medication.    4) Additional Instructions:        Please contact your physician with any problems or Same Day Surgery at [phone], Monday through Friday 6 am to 4 pm, or [place] at [facility] number at [phone].

## 2018-10-28 NOTE — H&P (Signed)
Patient ID: Kristi [name], female   DOB: [birth year], 72 y.o.   MRN: [id number]  HPI Kristi [name] is a 72 y.o. female w nely diagnosed SCC lung. Inoperable. In need for port placement. No hx easy bruising or previous manipulation of central veins. CBC, pl normal. No clinical evidence of coagulopathy.  HPI  Past Medical History:  Diagnosis Date  . Acute MI, inferoposterior wall ([place]) 09/30/2014  . Claustrophobia   . Coronary artery disease   . Hypercholesteremia   . Hypertension   . MI, old   . 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 APPENDECTOMY     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  . BLADDER NECK SUSPENSION    . CHOLECYSTECTOMY N/A 08/14/2018   Procedure: LAPAROSCOPIC CHOLECYSTECTOMY;  Surgeon: Jules Husbands, MD;  Location: [facility] ORS;  Service: General;  Laterality: N/A;  . CORONARY STENT PLACEMENT    . ENDOBRONCHIAL ULTRASOUND N/A 10/21/2018   Procedure: ENDOBRONCHIAL ULTRASOUND;  Surgeon: Laverle Hobby, MD;  Location: [facility] ORS;  Service: Pulmonary;  Laterality: N/A;  . HERNIA REPAIR    . LEFT HEART CATHETERIZATION WITH CORONARY ANGIOGRAM N/A 09/29/2014   Procedure: LEFT HEART CATHETERIZATION WITH CORONARY ANGIOGRAM;  Surgeon: Clent Demark, MD;  Location: [facility] CATH LAB;  Service: Cardiovascular;  Laterality: N/A;    Family History  Problem Relation Age of Onset  . Alzheimer's disease Mother   . Colon cancer Father   . Breast cancer Neg Hx     Social History Social History   Tobacco Use  . Smoking status: Former Smoker    Packs/day: 1.00    Years: 39.00    Pack years: 39.00    Types: Cigarettes    Start date: 12/21/1978    Last attempt to quit: 10/16/2018    Years since quitting: 0.0  . Smokeless tobacco: Never Used  Substance Use Topics  . Alcohol use: Yes    Alcohol/week: 0.0 standard drinks    Comment: occassional - approx 1 every 2 weeks   . Drug use: No    No Known  Allergies  Current Facility-Administered Medications  Medication Dose Route Frequency Provider Last Rate Last Dose  . ceFAZolin (ANCEF) 2-4 GM/100ML-% IVPB           . ceFAZolin (ANCEF) IVPB 2g/100 mL premix  2 g Intravenous On Call to [place], [place] I, MD      . Chlorhexidine Gluconate Cloth 2 % PADS 6 each  6 each Topical Once Nattalie Santiesteban, Marjory Lies, MD       And  . Chlorhexidine Gluconate Cloth 2 % PADS 6 each  6 each Topical Once Nyhla Mountjoy, [place] F, MD         Review of Systems Full ROS  was asked and was negative except for the information on the HPI  Physical Exam Blood pressure 91/60, pulse 89, temperature 98.7 F (37.1 C), temperature source Oral, resp. rate 20, SpO2 95 %. CONSTITUTIONAL: NAD. EYES: Pupils are equal, round, and reactive to light, Sclera are non-icteric. EARS, NOSE, MOUTH AND THROAT: The oropharynx is clear. The oral mucosa is pink and moist. Hearing is intact to voice. LYMPH NODES:  Lymph nodes in the neck are normal. RESPIRATORY:  Lungs are clear. There is normal respiratory effort, with equal breath sounds bilaterally, and without pathologic use of accessory muscles. CARDIOVASCULAR: Heart is regular without murmurs, gallops, or rubs. GI: The abdomen is soft, nontender, and  nondistended. There are no palpable masses. There is no hepatosplenomegaly. There are normal bowel sounds in all quadrants. GU: Rectal deferred.   MUSCULOSKELETAL: Normal muscle strength and tone. No cyanosis or edema.   SKIN: Turgor is good and there are no pathologic skin lesions or ulcers. NEUROLOGIC: Motor and sensation is grossly normal. Cranial nerves are grossly intact. PSYCH:  Oriented to person, place and time. Affect is normal.  Data Reviewed  I have personally reviewed the patient's imaging, laboratory findings and medical records.    Assessment/Plan 72 yo in need for port placement. Procedure d/w the pt in detail. Risks, benefits and possible complications including but not limited to  : bleeding, infection, ptx, vascular injury.  She understands and agrees with the procedure.  Caroleen Hamman, MD FACS General Surgeon 10/28/2018, 11:51 AM

## 2018-10-28 NOTE — Anesthesia Postprocedure Evaluation (Signed)
Anesthesia Post Note  Patient: Kristi [name] [facility]  Procedure(s) Performed: INSERTION PORT-A-CATH (Right Chest)  Patient location during evaluation: PACU Anesthesia Type: General Level of consciousness: awake and alert and oriented Pain management: pain level controlled Vital Signs Assessment: post-procedure vital signs reviewed and stable Respiratory status: spontaneous breathing, nonlabored ventilation and respiratory function stable Cardiovascular status: blood pressure returned to baseline and stable Postop Assessment: no signs of nausea or vomiting Anesthetic complications: no     Last Vitals:  Vitals:   10/28/18 1135 10/28/18 1527  BP: 91/60 111/66  Pulse: 89   Resp: 20   Temp: 37.1 C (!) 36.4 C  SpO2: 95%     Last Pain:  Vitals:   10/28/18 1527  TempSrc:   PainSc: Asleep                 Earvin Blazier

## 2018-10-28 NOTE — Transfer of Care (Signed)
Immediate Anesthesia Transfer of Care Note  Patient: Kristi [name] [facility]  Procedure(s) Performed: INSERTION PORT-A-CATH (Right Chest)  Patient Location: PACU  Anesthesia Type:General  Level of Consciousness: sedated  Airway & Oxygen Therapy: Patient Spontanous Breathing and Patient connected to nasal cannula oxygen  Post-op Assessment: Report given to RN and Post -op Vital signs reviewed and stable  Post vital signs: Reviewed and stable  Last Vitals:  Vitals Value Taken Time  BP 111/66 10/28/2018  3:27 PM  Temp    Pulse 91 10/28/2018  3:27 PM  Resp 18 10/28/2018  3:27 PM  SpO2 98 % 10/28/2018  3:27 PM  Vitals shown include unvalidated device data.  Last Pain:  Vitals:   10/28/18 1135  TempSrc: Oral  PainSc: 0-No pain         Complications: No apparent anesthesia complications

## 2018-10-28 NOTE — Patient Instructions (Signed)
Atezolizumab injection What is this medicine? ATEZOLIZUMAB (a te zoe LIZ ue mab) is a monoclonal antibody. It is used to treat bladder cancer (urothelial cancer) and non-small cell lung cancer. This medicine may be used for other purposes; ask your health care provider or pharmacist if you have questions. COMMON BRAND NAME(S): Tecentriq What should I tell my health care provider before I take this medicine? They need to know if you have any of these conditions: -diabetes -immune system problems -infection -inflammatory bowel disease -liver disease -lung or breathing disease -lupus -nervous system problems like myasthenia gravis or Guillain-Barre syndrome -organ transplant -an unusual or allergic reaction to atezolizumab, other medicines, foods, dyes, or preservatives -pregnant or trying to get pregnant -breast-feeding How should I use this medicine? This medicine is for infusion into a vein. It is given by a health care professional in a hospital or clinic setting. A special MedGuide will be given to you before each treatment. Be sure to read this information carefully each time. Talk to your pediatrician regarding the use of this medicine in children. Special care may be needed. Overdosage: If you think you have taken too much of this medicine contact a poison control center or emergency room at once. NOTE: This medicine is only for you. Do not share this medicine with others. What if I miss a dose? It is important not to miss your dose. Call your doctor or health care professional if you are unable to keep an appointment. What may interact with this medicine? Interactions have not been studied. This list may not describe all possible interactions. Give your health care provider a list of all the medicines, herbs, non-prescription drugs, or dietary supplements you use. Also tell them if you smoke, drink alcohol, or use illegal drugs. Some items may interact with your medicine. What  should I watch for while using this medicine? Your condition will be monitored carefully while you are receiving this medicine. You may need blood work done while you are taking this medicine. Do not become pregnant while taking this medicine or for at least 5 months after stopping it. Women should inform their doctor if they wish to become pregnant or think they might be pregnant. There is a potential for serious side effects to an unborn child. Talk to your health care professional or pharmacist for more information. Do not breast-feed an infant while taking this medicine or for at least 5 months after the last dose. What side effects may I notice from receiving this medicine? Side effects that you should report to your doctor or health care professional as soon as possible: -allergic reactions like skin rash, itching or hives, swelling of the face, lips, or tongue -black, tarry stools -bloody or watery diarrhea -breathing problems -changes in vision -chest pain or chest tightness -chills -facial flushing -fever -headache -signs and symptoms of high blood sugar such as dizziness; dry mouth; dry skin; fruity breath; nausea; stomach pain; increased hunger or thirst; increased urination -signs and symptoms of liver injury like dark yellow or brown urine; general ill feeling or flu-like symptoms; light-colored stools; loss of appetite; nausea; right upper belly pain; unusually weak or tired; yellowing of the eyes or skin -stomach pain -trouble passing urine or change in the amount of urine Side effects that usually do not require medical attention (report to your doctor or health care professional if they continue or are bothersome): -cough -diarrhea -joint pain -muscle pain -muscle weakness -tiredness -weight loss This list may not describe all   possible side effects. Call your doctor for medical advice about side effects. You may report side effects to FDA at 1-800-FDA-1088. Where should  I keep my medicine? This drug is given in a hospital or clinic and will not be stored at home. NOTE: This sheet is a summary. It may not cover all possible information. If you have questions about this medicine, talk to your doctor, pharmacist, or health care provider.  2018 [employer] Standard (2016-01-08 17:54:14) Etoposide, VP-16 injection What is this medicine? ETOPOSIDE, VP-16 (e toe POE side) is a chemotherapy drug. It is used to treat testicular cancer, lung cancer, and other cancers. This medicine may be used for other purposes; ask your health care provider or pharmacist if you have questions. COMMON BRAND NAME(S): Etopophos, Toposar, VePesid What should I tell my health care provider before I take this medicine? They need to know if you have any of these conditions: -infection -kidney disease -liver disease -low blood counts, like low white cell, platelet, or red cell counts -an unusual or allergic reaction to etoposide, other medicines, foods, dyes, or preservatives -pregnant or trying to get pregnant -breast-feeding How should I use this medicine? This medicine is for infusion into a vein. It is administered in a hospital or clinic by a specially trained health care professional. Talk to your pediatrician regarding the use of this medicine in children. Special care may be needed. Overdosage: If you think you have taken too much of this medicine contact a poison control center or emergency room at once. NOTE: This medicine is only for you. Do not share this medicine with others. What if I miss a dose? It is important not to miss your dose. Call your doctor or health care professional if you are unable to keep an appointment. What may interact with this medicine? -aspirin -certain medications for seizures like carbamazepine, phenobarbital, phenytoin, valproic acid -cyclosporine -levamisole -warfarin This list may not describe all possible interactions. Give your health  care provider a list of all the medicines, herbs, non-prescription drugs, or dietary supplements you use. Also tell them if you smoke, drink alcohol, or use illegal drugs. Some items may interact with your medicine. What should I watch for while using this medicine? Visit your doctor for checks on your progress. This drug may make you feel generally unwell. This is not uncommon, as chemotherapy can affect healthy cells as well as cancer cells. Report any side effects. Continue your course of treatment even though you feel ill unless your doctor tells you to stop. In some cases, you may be given additional medicines to help with side effects. Follow all directions for their use. Call your doctor or health care professional for advice if you get a fever, chills or sore throat, or other symptoms of a cold or flu. Do not treat yourself. This drug decreases your body's ability to fight infections. Try to avoid being around people who are sick. This medicine may increase your risk to bruise or bleed. Call your doctor or health care professional if you notice any unusual bleeding. Talk to your doctor about your risk of cancer. You may be more at risk for certain types of cancers if you take this medicine. Do not become pregnant while taking this medicine or for at least 6 months after stopping it. Women should inform their doctor if they wish to become pregnant or think they might be pregnant. Women of child-bearing potential will need to have a negative pregnancy test before starting this medicine. There   is a potential for serious side effects to an unborn child. Talk to your health care professional or pharmacist for more information. Do not breast-feed an infant while taking this medicine. Men must use a latex condom during sexual contact with a woman while taking this medicine and for at least 4 months after stopping it. A latex condom is needed even if you have had a vasectomy. Contact your doctor right away  if your partner becomes pregnant. Do not donate sperm while taking this medicine and for at least 4 months after you stop taking this medicine. Men should inform their doctors if they wish to father a child. This medicine may lower sperm counts. What side effects may I notice from receiving this medicine? Side effects that you should report to your doctor or health care professional as soon as possible: -allergic reactions like skin rash, itching or hives, swelling of the face, lips, or tongue -low blood counts - this medicine may decrease the number of white blood cells, red blood cells and platelets. You may be at increased risk for infections and bleeding. -signs of infection - fever or chills, cough, sore throat, pain or difficulty passing urine -signs of decreased platelets or bleeding - bruising, pinpoint red spots on the skin, black, tarry stools, blood in the urine -signs of decreased red blood cells - unusually weak or tired, fainting spells, lightheadedness -breathing problems -changes in vision -mouth or throat sores or ulcers -pain, redness, swelling or irritation at the injection site -pain, tingling, numbness in the hands or feet -redness, blistering, peeling or loosening of the skin, including inside the mouth -seizures -vomiting Side effects that usually do not require medical attention (report to your doctor or health care professional if they continue or are bothersome): -diarrhea -hair loss -loss of appetite -nausea -stomach pain This list may not describe all possible side effects. Call your doctor for medical advice about side effects. You may report side effects to FDA at 1-800-FDA-1088. Where should I keep my medicine? This drug is given in a hospital or clinic and will not be stored at home. NOTE: This sheet is a summary. It may not cover all possible information. If you have questions about this medicine, talk to your doctor, pharmacist, or health care provider.   2018 [employer] Standard (2015-11-29 11:53:23) Carboplatin injection What is this medicine? CARBOPLATIN (KAR boe pla tin) is a chemotherapy drug. It targets fast dividing cells, like cancer cells, and causes these cells to die. This medicine is used to treat ovarian cancer and many other cancers. This medicine may be used for other purposes; ask your health care provider or pharmacist if you have questions. COMMON BRAND NAME(S): Paraplatin What should I tell my health care provider before I take this medicine? They need to know if you have any of these conditions: -blood disorders -hearing problems -kidney disease -recent or ongoing radiation therapy -an unusual or allergic reaction to carboplatin, cisplatin, other chemotherapy, other medicines, foods, dyes, or preservatives -pregnant or trying to get pregnant -breast-feeding How should I use this medicine? This drug is usually given as an infusion into a vein. It is administered in a hospital or clinic by a specially trained health care professional. Talk to your pediatrician regarding the use of this medicine in children. Special care may be needed. Overdosage: If you think you have taken too much of this medicine contact a poison control center or emergency room at once. NOTE: This medicine is only for you. Do not share   this medicine with others. What if I miss a dose? It is important not to miss a dose. Call your doctor or health care professional if you are unable to keep an appointment. What may interact with this medicine? -medicines for seizures -medicines to increase blood counts like filgrastim, pegfilgrastim, sargramostim -some antibiotics like amikacin, gentamicin, neomycin, streptomycin, tobramycin -vaccines Talk to your doctor or health care professional before taking any of these medicines: -acetaminophen -aspirin -ibuprofen -ketoprofen -naproxen This list may not describe all possible interactions. Give your health  care provider a list of all the medicines, herbs, non-prescription drugs, or dietary supplements you use. Also tell them if you smoke, drink alcohol, or use illegal drugs. Some items may interact with your medicine. What should I watch for while using this medicine? Your condition will be monitored carefully while you are receiving this medicine. You will need important blood work done while you are taking this medicine. This drug may make you feel generally unwell. This is not uncommon, as chemotherapy can affect healthy cells as well as cancer cells. Report any side effects. Continue your course of treatment even though you feel ill unless your doctor tells you to stop. In some cases, you may be given additional medicines to help with side effects. Follow all directions for their use. Call your doctor or health care professional for advice if you get a fever, chills or sore throat, or other symptoms of a cold or flu. Do not treat yourself. This drug decreases your body's ability to fight infections. Try to avoid being around people who are sick. This medicine may increase your risk to bruise or bleed. Call your doctor or health care professional if you notice any unusual bleeding. Be careful brushing and flossing your teeth or using a toothpick because you may get an infection or bleed more easily. If you have any dental work done, tell your dentist you are receiving this medicine. Avoid taking products that contain aspirin, acetaminophen, ibuprofen, naproxen, or ketoprofen unless instructed by your doctor. These medicines may hide a fever. Do not become pregnant while taking this medicine. Women should inform their doctor if they wish to become pregnant or think they might be pregnant. There is a potential for serious side effects to an unborn child. Talk to your health care professional or pharmacist for more information. Do not breast-feed an infant while taking this medicine. What side effects may I  notice from receiving this medicine? Side effects that you should report to your doctor or health care professional as soon as possible: -allergic reactions like skin rash, itching or hives, swelling of the face, lips, or tongue -signs of infection - fever or chills, cough, sore throat, pain or difficulty passing urine -signs of decreased platelets or bleeding - bruising, pinpoint red spots on the skin, black, tarry stools, nosebleeds -signs of decreased red blood cells - unusually weak or tired, fainting spells, lightheadedness -breathing problems -changes in hearing -changes in vision -chest pain -high blood pressure -low blood counts - This drug may decrease the number of white blood cells, red blood cells and platelets. You may be at increased risk for infections and bleeding. -nausea and vomiting -pain, swelling, redness or irritation at the injection site -pain, tingling, numbness in the hands or feet -problems with balance, talking, walking -trouble passing urine or change in the amount of urine Side effects that usually do not require medical attention (report to your doctor or health care professional if they continue or are bothersome): -  hair loss -loss of appetite -metallic taste in the mouth or changes in taste This list may not describe all possible side effects. Call your doctor for medical advice about side effects. You may report side effects to FDA at 1-800-FDA-1088. Where should I keep my medicine? This drug is given in a hospital or clinic and will not be stored at home. NOTE: This sheet is a summary. It may not cover all possible information. If you have questions about this medicine, talk to your doctor, pharmacist, or health care provider.  2018 [employer] Standard (2008-03-13 14:38:05)  

## 2018-10-28 NOTE — Progress Notes (Signed)
Hematology/Oncology Consult note [facility] Telephone:([phone] [phone]   Patient Care Team: Glean Hess, MD as PCP - General (Internal Medicine) Charolette Forward, MD as Consulting Physician (Cardiology) Telford Nab, RN as Registered Nurse  REFERRING PROVIDER: Dr. 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 Patient recently was referred to see pulmonology Dr. Felicie Morn for persistent cough and dyspnea.  Patient reports coughing up clear sputum for about 2 months.  No hemoptysis.  She also had weight loss. Former smoker, quitting smoking 2 weeks ago.  39-pack-year smoking history  09/30/2018 chest x-ray showed new market volume loss in the left chest area of the left mainstem bronchus worrisome for centrally obstructing mass. 10/17/2018 chest x-ray showed volume loss worsened in the left hemithorax worsening patchy consolidation in the mid to lower left lung with minimal residual aeration and up in the left lung mass not excluded.  Possible small left pleural effusion. 10/31 2019 CT chest with contrast showed large mediastinal mass involving both hilar, left greater than right, consistent with lung carcinoma The mass causes narrowing of the left mainstem bronchus with resultant volume loss on the left and a mediastinal shift to the left.  Moderate size left pleural effusion. 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  Today patient was accompanied by husband and daughter to clinic to discuss diagnosis and management plan. She reports feeling tired feels shortness of breath with exertion.  Persistent coughing, productive with clear sputum. Also reports difficulty  swallowing, feels food sticking in her food pipe.    Review of Systems  Constitutional: Positive for malaise/fatigue. Negative for chills and fever.  HENT: Negative for nosebleeds and sore throat.   Eyes: Negative for redness.  Respiratory: Positive for cough, sputum production and shortness of breath. Negative for wheezing.   Cardiovascular: Negative for chest pain, palpitations and orthopnea.  Gastrointestinal: Negative for abdominal pain, blood in stool, nausea and vomiting.  Genitourinary: Negative for dysuria.  Musculoskeletal: Negative for myalgias and neck pain.  Skin: Negative for itching and rash.  Neurological: Negative for tingling, tremors and headaches.  Endo/Heme/Allergies: Negative for environmental allergies. Does not bruise/bleed easily.  Psychiatric/Behavioral: Negative for depression.    MEDICAL HISTORY:  Past Medical History:  Diagnosis Date  . Acute MI, inferoposterior wall ([place]) 09/30/2014  . Claustrophobia   . Coronary artery disease   . Hypercholesteremia   . Hypertension   . MI, old   . 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 [address]) 10/27/2018    SURGICAL HISTORY: Past Surgical History:  Procedure Laterality Date  . APPENDECTOMY     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  . BLADDER NECK SUSPENSION    . CHOLECYSTECTOMY N/A 08/14/2018   Procedure: LAPAROSCOPIC CHOLECYSTECTOMY;  Surgeon: Jules Husbands, MD;  Location: [facility] ORS;  Service: General;  Laterality: N/A;  . CORONARY STENT PLACEMENT    . ENDOBRONCHIAL ULTRASOUND N/A 10/21/2018   Procedure: ENDOBRONCHIAL ULTRASOUND;  Surgeon: Laverle Hobby, MD;  Location: [facility] ORS;  Service: Pulmonary;  Laterality: N/A;  . HERNIA REPAIR    . LEFT HEART CATHETERIZATION WITH CORONARY ANGIOGRAM N/A 09/29/2014   Procedure: LEFT HEART CATHETERIZATION WITH CORONARY ANGIOGRAM;  Surgeon: Clent Demark, MD;  Location: [facility] CATH LAB;  Service: Cardiovascular;  Laterality: N/A;    SOCIAL HISTORY: Social History   Socioeconomic  History  . Marital status: Married    Spouse name: Not on file  . Number of children: Not on file  . Years of education: Not on file  . Highest education level: Not on file  Occupational History  . Not on file  Social Needs  . Financial resource strain: Not on file  . Food insecurity:    Worry: Not on file    Inability: Not on file  . Transportation needs:    Medical: Not on file    Non-medical: Not on file  Tobacco Use  . Smoking status: Former Smoker    Packs/day: 1.00    Years: 39.00    Pack years: 39.00    Types: Cigarettes    Start date: 12/21/1978    Last attempt to quit: 10/16/2018    Years since quitting: 0.0  . Smokeless tobacco: Never Used  Substance and Sexual Activity  . Alcohol use: Yes    Alcohol/week: 0.0 standard drinks    Comment: occassional - approx 1 every 2 weeks   . Drug use: No  . Sexual activity: Yes    Birth control/protection: None  Lifestyle  . Physical activity:    Days per week: Not on file    Minutes per session: Not on file  . Stress: Not on file  Relationships  . Social connections:    Talks on phone: Not on file    Gets together: Not on file    Attends religious service: Not on file    Active member of club or organization: Not on file    Attends meetings of clubs or organizations: Not on file    Relationship status: Not on file  . Intimate partner violence:    Fear of current or ex partner: Not on file    Emotionally abused: Not on file    Physically abused: Not on file    Forced sexual activity: Not on file  Other Topics Concern  . Not on file  Social History Narrative  . Not on file    FAMILY HISTORY: Family History  Problem Relation Age of Onset  . Alzheimer's disease Mother   . Colon cancer Father   . Breast cancer Neg Hx     ALLERGIES:  has No Known Allergies.  MEDICATIONS:  Current Outpatient Medications  Medication Sig Dispense Refill  . albuterol (PROAIR HFA) 108 (90 Base) MCG/ACT inhaler Inhale 2 puffs into  the lungs every 6 (six) hours as needed for wheezing or shortness of breath. 18 g 2  . AMBULATORY NON FORMULARY MEDICATION Medication Name: Please provide patient with Nebulizer. DX: J44.9 1 each 0  . aspirin EC 81 MG tablet Take 81 mg by mouth daily.    [name] Kitchen atorvastatin (LIPITOR) 80 MG tablet Take 0.5 tablets (40 mg total) by mouth daily at 6 PM. (Patient taking differently: Take 80 mg by mouth at bedtime. ) 30 tablet 3  . azithromycin (ZITHROMAX) 250 MG tablet Take 1 tablet (250 mg total) by mouth daily. Take 2 tablets the first day, then once daily until gone. 6 tablet 0  . B Complex Vitamins (VITAMIN B COMPLEX PO) Take 1 tablet by mouth daily.     . benzonatate (TESSALON PERLES) 100 MG capsule Take 2 capsules (200 mg total) by mouth 3 (three) times daily. 90 capsule 2  . CALCIUM PO Take 1 tablet by mouth daily.     . cetirizine (ZYRTEC)  10 MG tablet Take 10 mg by mouth daily.    . Cholecalciferol (VITAMIN D) 2000 units CAPS Take 1 capsule (2,000 Units total) by mouth daily. 30 capsule   . docusate sodium (COLACE) 100 MG capsule Take 100 mg by mouth daily.    . Fluticasone-Umeclidin-Vilant (TRELEGY ELLIPTA) 100-62.5-25 MCG/INH AEPB Inhale 1 puff into the lungs daily. 1 each 12  . HYDROcodone-acetaminophen (HYCET) 7.5-325 mg/15 ml solution Take 10 mLs by mouth at bedtime for 14 days. 120 mL 0  . ipratropium-albuterol (DUONEB) 0.5-2.5 (3) MG/3ML SOLN Take 3 mLs by nebulization 3 (three) times daily. DX. J44.9 360 mL 1  . metoprolol tartrate (LOPRESSOR) 25 MG tablet Take 12.5 mg by mouth 2 (two) times daily.    . nitroGLYCERIN (NITROSTAT) 0.4 MG SL tablet Place 1 tablet (0.4 mg total) under the tongue every 5 (five) minutes x 3 doses as needed for chest pain. 25 tablet 12  . ALPRAZolam (XANAX) 0.5 MG tablet Take medication with you to MRI. Take 1 tablet prior to MRI. 5 tablet 0  . HYDROcodone-acetaminophen (NORCO/VICODIN) 5-325 MG tablet Take 1 tablet by mouth every 6 (six) hours as needed for  moderate pain. 20 tablet 0  . lidocaine-prilocaine (EMLA) cream Apply to affected area once 30 g 3  . ondansetron (ZOFRAN) 8 MG tablet Take 1 tablet (8 mg total) by mouth 2 (two) times daily as needed for refractory nausea / vomiting. Start on day 3 after carboplatin chemo. 30 tablet 1  . prochlorperazine (COMPAZINE) 10 MG tablet TAKE 1 TABLET BY MOUTH EVERY 6 HOURS AS NEEDED FOR NAUSEA OR VOMITING 337 tablet 1   No current facility-administered medications for this visit.      PHYSICAL EXAMINATION: ECOG PERFORMANCE STATUS: 1 - Symptomatic but completely ambulatory Vitals:   10/27/18 0938  BP: 95/66  Pulse: 95  Resp: 20  Temp: (!) 96.7 F (35.9 C)  SpO2: 95%   Filed Weights   10/27/18 0938  Weight: 148 lb 0.6 oz (67.1 kg)    Physical Exam  Constitutional: She is oriented to person, place, and time. No distress.  HENT:  Head: Normocephalic and atraumatic.  Eyes: Pupils are equal, round, and reactive to light. EOM are normal.  Cardiovascular: Normal rate.  No murmur heard. Pulmonary/Chest: Effort normal.  Decreased breath sounds bilaterally  Abdominal: Soft. Bowel sounds are normal.  Musculoskeletal: Normal range of motion.  Neurological: She is alert and oriented to person, place, and time.  Skin: Skin is warm.  Psychiatric: She has a normal mood and affect.     LABORATORY DATA:  I have reviewed the data as listed Lab Results  Component Value Date   WBC 8.7 09/19/2018   HGB 14.5 09/19/2018   HCT 42.7 09/19/2018   MCV 91 09/19/2018   PLT 265 09/19/2018   Recent Labs    08/12/18 1438  08/15/18 0535 08/29/18 1041 09/19/18 1038  NA 131*   < > 130* 137 136  K 3.7   < > 4.7 3.5 3.8  CL 97*   < > 99 95* 94*  CO2 25   < > 22 22 22   GLUCOSE 122*   < > 117* 94 81  BUN 9   < > 14 4* 6*  CREATININE 0.74   < > 0.63 0.74 0.71  CALCIUM 9.1   < > 8.3* 9.7 9.8  GFRNONAA >60   < > >60 82 86  GFRAA >60   < > >60 94 99  PROT 8.0   < >  6.4* 6.7 7.4  ALBUMIN 4.3   < >  3.0* 3.8 4.3  AST 33   < > 72* 34 30  ALT 26   < > 61* 21 14  ALKPHOS 81   < > 74 133* 94  BILITOT 0.6   < > 1.1 0.4 0.5  BILIDIR 0.2  --   --   --   --   IBILI 0.4  --   --   --   --    < > = values in this interval not displayed.   Iron/TIBC/Ferritin/ %Sat No results found for: IRON, TIBC, FERRITIN, IRONPCTSAT   RADIOGRAPHIC STUDIES: I have personally reviewed the radiological images as listed and agreed with the findings in the report. 10/20/2018 CT chest w contrast . Large mediastinal mass involving both hila left much greater than right consistent with lung carcinoma possibly small cell lung carcinoma. This mass causes narrowing of the left mainstem bronchus with resultant volume loss on the left and mediastinal shift to the Left. 2. Moderate size left pleural effusion. 3. Coronary artery calcifications and moderate thoracic aortic atherosclerosis.  PET 10/26/2018 . Large left lung mass is markedly hypermetabolic in the confluent lymphadenopathy in the mediastinum and in bilateral hilar regions also shows marked hypermetabolism. 2. Hypermetabolic metastatic lymphadenopathy in the right neck and supraclavicular region. 3. Hypermetabolic lymphadenopathy in the hepato duodenal ligament consistent with metastatic disease. 4. Large hypermetabolic lesion posterior right acetabulum without correlate of CT finding. Features highly suspicious for bony metastatic disease.    ASSESSMENT & PLAN:  1. Small cell lung cancer ([place])   2. Cough   3. Dysphagia, unspecified type   4. Other fatigue   5. Decrease in appetite   6. Goals of care, counseling/discussion    I independent reviewed patient's image findings including chest x-ray, CT chest and PET scan. Reviewed pathology report. Patient has extensive small cell lung cancer.   The small cell lung cancer diagnosis and care plan were discussed with patient in detail.  NCCN guidelines were reviewed and shared with patient.  Patient condition  is incurable.  Prognosis is poor. The goal of treatment which is to palliate disease, disease related symptoms, improve quality of life and hopefully prolong life was highlighted in our discussion.  Recommend MRI brain to finish staging Recommend palliative systemic chemotherapy with carboplatin/etoposide/Tecentriq. She will also need radiation given her compression symptoms. Tecentriq will be held for the initial couple rounds with patient receiving chest radiation.   Chemotherapy education was provided.  We had discussed the composition of chemotherapy regimen, length of chemo cycle, duration of treatment and the time to assess response to treatment.  Supportive care measures are necessary for patient well-being and will be provided as necessary.  I explained to the patient the risks and benefits of chemotherapy including all but not limited to hair loss, mouth sore, nausea, vomiting, low blood counts, bleeding, and risk of life threatening infection and even death, secondary malignancy etc.  Patient voices understanding and willing to proceed chemotherapy.  Growth factor-Udenyca would be given as prophylaxis for chemotherapy-induced neutropenia to prevent febrile neutropenias. Discussed potential side effect- myalgias/arthralgias- recommend Claritin for 4 days.   # Chemotherapy education; port placement. Hopefully the planned start chemotherapy next week. Antiemetics-Zofran and Compazine; EMLA cream sent to pharmacy  #Shortness of breath/cough; ambulatory oxygen pulse ox was obtained.  Patient oxygen saturation dropped to 77% with exertion.  We will arrange patient to have oxygen at home. #Pleural effusion, moderate.  I  discussed with Dr. Felicie Morn.  Given that patient is going to start with chemotherapy and usually small cell lung cancer respond well with initial treatment.  Continue monitor.  If her respiratory status further worsened, consider therapeutic thoracentesis.  #Refer to radiation  oncology for evaluation. We spent sufficient time to discuss many aspect of care, questions were answered to patient's satisfaction.  Orders Placed This Encounter  Procedures  . For home use only DME oxygen    Order Specific Question:   Mode or (Route)    Answer:   Nasal cannula    Order Specific Question:   Liters per Minute    Answer:   2    Order Specific Question:   Frequency    Answer:   Continuous (stationary and portable oxygen unit needed)    Order Specific Question:   Oxygen conserving device    Answer:   Yes    Order Specific Question:   Oxygen delivery system    Answer:   Gas  . MR Brain W Wo Contrast    Patient can leave after scan per provider    Standing Status:   Future    Number of Occurrences:   1    Standing Expiration Date:   10/27/2019    Order Specific Question:   ** REASON FOR EXAM (FREE TEXT)    Answer:   small cell lung cancer    Order Specific Question:   If indicated for the ordered procedure, I authorize the administration of contrast media per Radiology protocol    Answer:   Yes    Order Specific Question:   What is the patient's sedation requirement?    Answer:   No Sedation    Order Specific Question:   Does the patient have a pacemaker or implanted devices?    Answer:   No    Order Specific Question:   Use SRS Protocol?    Answer:   No    Order Specific Question:   Call Results- Best Contact Number?    Answer:   [phone]    Order Specific Question:   Radiology Contrast Protocol - do NOT remove file path    Answer:   [email or web address]    Order Specific Question:   Preferred imaging location?    Answer:   [facility] (table limit-300lbs)  . CBC with Differential    Standing Status:   Standing    Number of Occurrences:   20    Standing Expiration Date:   10/28/2019  . Comprehensive metabolic panel    Standing Status:   Standing    Number of Occurrences:   20    Standing Expiration Date:   10/28/2019  . TSH     Standing Status:   Standing    Number of Occurrences:   20    Standing Expiration Date:   10/28/2019  . Ambulatory referral to General Surgery    Referral Priority:   Urgent    Referral Type:   Surgical    Referral Reason:   Specialty Services Required    Referred to Provider:   Jules Husbands, MD    Requested Specialty:   General Surgery    Number of Visits Requested:   1  . Ambulatory referral to Radiation Oncology    Referral Priority:   Urgent    Referral Type:   Consultation    Referral Reason:   Specialty Services Required    Referred to Provider:   [name], MD  Requested Specialty:   Radiation Oncology    Number of Visits Requested:   1  . Amb Referral to Nutrition and Diabetic E    Referral Priority:   Routine    Referral Type:   Consultation    Referral Reason:   Specialty Services Required    Number of Visits Requested:   1  . Ambulatory Referral to Palliative Care    Referral Priority:   Routine    Referral Type:   Consultation    Referred to Provider:   Borders, Kirt Boys, NP    Number of Visits Requested:   1    All questions were answered. The patient knows to call the clinic with any problems questions or concerns.  Return of visit: 11/02/2018  Thank you for this kind referral and the opportunity to participate in the care of this patient. A copy of today's note is routed to referring provider  Total face to face encounter time for this patient visit was 65 min. >50% of the time was  spent in counseling and coordination of care.    Earlie Server, MD, PhD Hematology Oncology [facility] at [facility] Pager- [phone] 10/28/2018

## 2018-10-28 NOTE — Op Note (Signed)
  Pre-operative Diagnosis:Lung CA  Post-operative Diagnosis: same   Surgeon: Caroleen Hamman, MD FACS  Anesthesia: IV sedation, marcaine .25% w epi and lidocaine 1%  Procedure: right IJ  Port placement with fluoroscopy under U/S guidance  Findings: Good position of the tip of the catheter by fluoroscopy  Estimated Blood Loss: Minimal         Drains: None         Specimens: None       Complications: none         Assistant: Mr. Freda Jackson   Procedure Details  The patient was seen again in the Holding Room. The benefits, complications, treatment options, and expected outcomes were discussed with the patient. The risks of bleeding, infection, recurrence of symptoms, failure to resolve symptoms,  thrombosis nonfunction breakage pneumothorax hemopneumothorax any of which could require chest tube or further surgery were reviewed with the patient.   The patient was taken to Operating Room, identified as [facility] and the procedure verified.  A Time Out was held and the above information confirmed.  Prior to the induction of general anesthesia, antibiotic prophylaxis was administered. VTE prophylaxis was in place. Appropriate anesthesia was then administered and tolerated well. The chest was prepped with Chloraprep and draped in the sterile fashion. The patient was positioned in the supine position. Then the patient was placed in Trendelenburg position.  Patient was prepped and draped in sterile fashion and in a Trendelenburg position local anesthetic was infiltrated into the skin and subcutaneous tissues in the neck and anterior chest wall. The large bore needle was placed into the internal jugular vein under U/S guidance without difficulty and then the Seldinger wire was advanced. Fluoroscopy was utilized to confirm that the Seldinger wire was in the superior vena cava.  An incision was made and a port pocket developed with blunt and electrocautery dissection. The introducer dilator was  placed over the Seldinger wire the wire was removed. The previously flushed catheter was placed into the introducer dilator and the peel-away sheath was removed. The catheter length was confirmed and trimmed utilizing fluoroscopy for proper positioning. The catheter was then attached to the previously flushed port. The port was placed into the pocket. The port was held in with 2-0 Prolenes and flushed for function and heparin locked.  The wound was closed with interrupted 3-0 Vicryl followed by 4-0 subcuticular Monocryl sutures. Dermabond used to coat the skin  Patient was taken to the recovery room in stable condition where a postoperative chest film has been ordered.

## 2018-10-28 NOTE — Telephone Encounter (Signed)
Prior Authorization requested for lidocaine-prilocaine (EMLA) cream.  Kristi [name] completed prior auth via phone.    Authorization approved coverage from 07/30/2018 -01/26/2019.

## 2018-10-28 NOTE — Anesthesia Post-op Follow-up Note (Signed)
Anesthesia QCDR form completed.        

## 2018-10-31 ENCOUNTER — Encounter: Payer: Self-pay | Admitting: Surgery

## 2018-10-31 ENCOUNTER — Inpatient Hospital Stay: Payer: Medicare Other

## 2018-11-01 ENCOUNTER — Other Ambulatory Visit: Payer: Self-pay

## 2018-11-01 ENCOUNTER — Inpatient Hospital Stay (HOSPITAL_BASED_OUTPATIENT_CLINIC_OR_DEPARTMENT_OTHER): Payer: Medicare Other | Admitting: Hospice and Palliative Medicine

## 2018-11-01 ENCOUNTER — Encounter: Payer: Self-pay | Admitting: Hospice and Palliative Medicine

## 2018-11-01 ENCOUNTER — Ambulatory Visit
Admission: RE | Admit: 2018-11-01 | Discharge: 2018-11-01 | Disposition: A | Discharge status: Home / Self Care | Payer: Medicare Other | Source: Ambulatory Visit | Attending: Radiation Oncology | Admitting: Radiation Oncology

## 2018-11-01 ENCOUNTER — Encounter: Payer: Self-pay | Admitting: Radiation Oncology

## 2018-11-01 VITALS — Temp 96.4°F | Wt 148.8 lb

## 2018-11-01 VITALS — BP 84/60 | Temp 96.3°F | Wt 148.5 lb

## 2018-11-01 DIAGNOSIS — I119 Hypertensive heart disease without heart failure: Secondary | ICD-10-CM

## 2018-11-01 DIAGNOSIS — Z515 Encounter for palliative care: Secondary | ICD-10-CM

## 2018-11-01 DIAGNOSIS — I10 Essential (primary) hypertension: Secondary | ICD-10-CM | POA: Diagnosis not present

## 2018-11-01 DIAGNOSIS — R1310 Dysphagia, unspecified: Secondary | ICD-10-CM | POA: Diagnosis not present

## 2018-11-01 DIAGNOSIS — Z5111 Encounter for antineoplastic chemotherapy: Secondary | ICD-10-CM | POA: Diagnosis not present

## 2018-11-01 DIAGNOSIS — J90 Pleural effusion, not elsewhere classified: Secondary | ICD-10-CM | POA: Diagnosis not present

## 2018-11-01 DIAGNOSIS — I2510 Atherosclerotic heart disease of native coronary artery without angina pectoris: Secondary | ICD-10-CM | POA: Insufficient documentation

## 2018-11-01 DIAGNOSIS — C773 Secondary and unspecified malignant neoplasm of axilla and upper limb lymph nodes: Secondary | ICD-10-CM | POA: Diagnosis not present

## 2018-11-01 DIAGNOSIS — F419 Anxiety disorder, unspecified: Secondary | ICD-10-CM

## 2018-11-01 DIAGNOSIS — F17210 Nicotine dependence, cigarettes, uncomplicated: Secondary | ICD-10-CM | POA: Insufficient documentation

## 2018-11-01 DIAGNOSIS — Z7982 Long term (current) use of aspirin: Secondary | ICD-10-CM | POA: Insufficient documentation

## 2018-11-01 DIAGNOSIS — J9610 Chronic respiratory failure, unspecified whether with hypoxia or hypercapnia: Secondary | ICD-10-CM | POA: Diagnosis not present

## 2018-11-01 DIAGNOSIS — C3492 Malignant neoplasm of unspecified part of left bronchus or lung: Secondary | ICD-10-CM | POA: Insufficient documentation

## 2018-11-01 DIAGNOSIS — R52 Pain, unspecified: Secondary | ICD-10-CM

## 2018-11-01 DIAGNOSIS — C3402 Malignant neoplasm of left main bronchus: Secondary | ICD-10-CM

## 2018-11-01 DIAGNOSIS — G893 Neoplasm related pain (acute) (chronic): Secondary | ICD-10-CM

## 2018-11-01 DIAGNOSIS — F32A Depression, unspecified: Secondary | ICD-10-CM

## 2018-11-01 DIAGNOSIS — I252 Old myocardial infarction: Secondary | ICD-10-CM | POA: Diagnosis not present

## 2018-11-01 DIAGNOSIS — J439 Emphysema, unspecified: Secondary | ICD-10-CM | POA: Diagnosis not present

## 2018-11-01 DIAGNOSIS — C3490 Malignant neoplasm of unspecified part of unspecified bronchus or lung: Secondary | ICD-10-CM

## 2018-11-01 DIAGNOSIS — F418 Other specified anxiety disorders: Secondary | ICD-10-CM | POA: Diagnosis not present

## 2018-11-01 DIAGNOSIS — R531 Weakness: Secondary | ICD-10-CM | POA: Diagnosis not present

## 2018-11-01 DIAGNOSIS — R634 Abnormal weight loss: Secondary | ICD-10-CM

## 2018-11-01 DIAGNOSIS — F329 Major depressive disorder, single episode, unspecified: Secondary | ICD-10-CM

## 2018-11-01 DIAGNOSIS — E7800 Pure hypercholesterolemia, unspecified: Secondary | ICD-10-CM | POA: Insufficient documentation

## 2018-11-01 DIAGNOSIS — Z79899 Other long term (current) drug therapy: Secondary | ICD-10-CM | POA: Insufficient documentation

## 2018-11-01 DIAGNOSIS — D649 Anemia, unspecified: Secondary | ICD-10-CM | POA: Diagnosis not present

## 2018-11-01 MED ORDER — SERTRALINE HCL 25 MG PO TABS: 25.0000 mg | ORAL_TABLET | Freq: Every day | ORAL | 2 refills | Status: DC

## 2018-11-01 NOTE — Progress Notes (Signed)
Patient here for palliative care consult. Pt states that when she coughs it leads to her throwing up flem.

## 2018-11-01 NOTE — Progress Notes (Addendum)
[place]  [phone] [phone]   Name: Amica Harron [facility] Date: 11/01/2018 MRN: [id number]  DOB: Oct 29, 1946  Patient Care Team: Glean Hess, MD as PCP - General (Internal Medicine) Charolette Forward, MD as Consulting Physician (Cardiology) Telford Nab, RN as Registered Nurse    REASON FOR CONSULTATION: Palliative Care consult requested for this 72 y.o. female with multiple medical problems including stage IV small cell lung cancer.  PMH also notable for CAD with history of acute MI, claustrophobia, hypertension, and hyperlipidemia.  Patient was recently seen and examined by pulmonology due to worsening exertional dyspnea.  She was found to have a central obstructing mass and mediastinal adenopathy on CT.  Patient is status post bronchoscopy with biopsy confirming small cell lung cancer.  PET CT scan revealed hypermetabolic areas involving lymphadenopathy in the neck and supraclavicular region, hepatoduodenal ligament, and disease of the right acetabulum.  Patient is pending initiation of chemotherapy with Atezolizumab, Carboplatin, and Etoposide.  Additionally she is being considered for palliative XRT of the chest mass to help with dysphagia.  Palliative care was asked to help address goals and symptoms.   SOCIAL HISTORY:    Patient is married and lives at home with her husband of 70 years.  They moved here from [place], [place] around 4 years ago.  Patient has 2 sons and a daughter.  One son lives in [place] and the other 2 children are in [place].  Patient used to work for a [employer] and then later for the local [place].  ADVANCE DIRECTIVES:  unknown  CODE STATUS: Full code  PAST MEDICAL HISTORY: Past Medical History:  Diagnosis Date  . Acute MI, inferoposterior wall ([place]) 09/30/2014  . Claustrophobia   . Coronary artery disease   . Hypercholesteremia   . Hypertension   .  MI, old   . 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 [facility]) 10/27/2018    PAST SURGICAL HISTORY:  Past Surgical History:  Procedure Laterality Date  . APPENDECTOMY     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  . BLADDER NECK SUSPENSION    . CHOLECYSTECTOMY N/A 08/14/2018   Procedure: LAPAROSCOPIC CHOLECYSTECTOMY;  Surgeon: Jules Husbands, MD;  Location: [facility] ORS;  Service: General;  Laterality: N/A;  . CORONARY STENT PLACEMENT    . ENDOBRONCHIAL ULTRASOUND N/A 10/21/2018   Procedure: ENDOBRONCHIAL ULTRASOUND;  Surgeon: Laverle Hobby, MD;  Location: [facility] ORS;  Service: Pulmonary;  Laterality: N/A;  . HERNIA REPAIR    . LEFT HEART CATHETERIZATION WITH CORONARY ANGIOGRAM N/A 09/29/2014   Procedure: LEFT HEART CATHETERIZATION WITH CORONARY ANGIOGRAM;  Surgeon: Clent Demark, MD;  Location: [facility] CATH LAB;  Service: Cardiovascular;  Laterality: N/A;  . PORTACATH PLACEMENT Right 10/28/2018   Procedure: INSERTION PORT-A-CATH;  Surgeon: Jules Husbands, MD;  Location: [facility] ORS;  Service: General;  Laterality: Right;    HEMATOLOGY/ONCOLOGY HISTORY:    Small cell lung cancer ([place])   10/27/2018 Initial Diagnosis    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 [facility])    10/27/2018 -  Chemotherapy    The patient had palonosetron (ALOXI) injection 0.25 mg, 0.25 mg, Intravenous,  Once, 0 of 4 cycles pegfilgrastim-cbqv (UDENYCA) injection 6 mg, 6 mg, Subcutaneous, Once, 0 of 4 cycles CARBOplatin (PARAPLATIN) in sodium chloride 0.9 % 100 mL chemo infusion,  (original dose ), Intravenous,  Once, 0 of 4 cycles Dose modification:   (Cycle 1) etoposide (VEPESID) 180 mg in sodium chloride 0.9 %  500 mL chemo infusion, 100 mg/m2, Intravenous,  Once, 0 of 4 cycles fosaprepitant (EMEND) 150 mg, dexamethasone (DECADRON) 12 mg in sodium chloride 0.9 % 145 mL IVPB, , Intravenous,  Once, 0 of 4 cycles atezolizumab (TECENTRIQ) 1,200 mg in sodium chloride 0.9 % 250 mL chemo infusion, 1,200 mg, Intravenous, Once, 0 of 7 cycles  for chemotherapy  treatment.      ALLERGIES:  has No Known Allergies.  MEDICATIONS:  Current Outpatient Medications  Medication Sig Dispense Refill  . albuterol (PROAIR HFA) 108 (90 Base) MCG/ACT inhaler Inhale 2 puffs into the lungs every 6 (six) hours as needed for wheezing or shortness of breath. 18 g 2  . AMBULATORY NON FORMULARY MEDICATION Medication Name: Please provide patient with Nebulizer. DX: J44.9 1 each 0  . aspirin EC 81 MG tablet Take 81 mg by mouth daily.    [name] Kitchen atorvastatin (LIPITOR) 80 MG tablet Take 0.5 tablets (40 mg total) by mouth daily at 6 PM. (Patient taking differently: Take 80 mg by mouth at bedtime. ) 30 tablet 3  . B Complex Vitamins (VITAMIN B COMPLEX PO) Take 1 tablet by mouth daily.     . benzonatate (TESSALON PERLES) 100 MG capsule Take 2 capsules (200 mg total) by mouth 3 (three) times daily. 90 capsule 2  . CALCIUM PO Take 1 tablet by mouth daily.     . cetirizine (ZYRTEC) 10 MG tablet Take 10 mg by mouth daily.    . Cholecalciferol (VITAMIN D) 2000 units CAPS Take 1 capsule (2,000 Units total) by mouth daily. 30 capsule   . docusate sodium (COLACE) 100 MG capsule Take 100 mg by mouth daily.    . Fluticasone-Umeclidin-Vilant (TRELEGY ELLIPTA) 100-62.5-25 MCG/INH AEPB Inhale 1 puff into the lungs daily. 1 each 12  . HYDROcodone-acetaminophen (HYCET) 7.5-325 mg/15 ml solution Take 10 mLs by mouth at bedtime for 14 days. 120 mL 0  . HYDROcodone-acetaminophen (NORCO/VICODIN) 5-325 MG tablet Take 1 tablet by mouth every 6 (six) hours as needed for moderate pain. 20 tablet 0  . ipratropium-albuterol (DUONEB) 0.5-2.5 (3) MG/3ML SOLN Take 3 mLs by nebulization 3 (three) times daily. DX. J44.9 360 mL 1  . lidocaine-prilocaine (EMLA) cream Apply to affected area once 30 g 3  . metoprolol tartrate (LOPRESSOR) 25 MG tablet Take 12.5 mg by mouth 2 (two) times daily.    . nitroGLYCERIN (NITROSTAT) 0.4 MG SL tablet Place 1 tablet (0.4 mg total) under the tongue every 5 (five) minutes x 3  doses as needed for chest pain. 25 tablet 12  . ondansetron (ZOFRAN) 8 MG tablet Take 1 tablet (8 mg total) by mouth 2 (two) times daily as needed for refractory nausea / vomiting. Start on day 3 after carboplatin chemo. 30 tablet 1  . prochlorperazine (COMPAZINE) 10 MG tablet TAKE 1 TABLET BY MOUTH EVERY 6 HOURS AS NEEDED FOR NAUSEA OR VOMITING 337 tablet 1  . ALPRAZolam (XANAX) 0.5 MG tablet Take medication with you to MRI. Take 1 tablet prior to MRI. (Patient not taking: Reported on 11/01/2018) 5 tablet 0  . sertraline (ZOLOFT) 25 MG tablet Take 1 tablet (25 mg total) by mouth daily. 30 tablet 2   No current facility-administered medications for this visit.     VITAL SIGNS: Temp (!) 96.4 F (35.8 C) (Tympanic)   Wt 148 lb 12.8 oz (67.5 kg)   SpO2 96% Comment: room air  BMI 24.76 kg/m  Filed Weights   11/01/18 1025  Weight: 148 lb 12.8 oz (67.5  kg)    Estimated body mass index is 24.76 kg/m as calculated from the following:   Height as of 10/27/18: 5' 5"  (1.651 m).   Weight as of this encounter: 148 lb 12.8 oz (67.5 kg).  LABS: CBC:    Component Value Date/Time   WBC 8.7 09/19/2018 1038   WBC 16.8 (H) 08/15/2018 0535   HGB 14.5 09/19/2018 1038   HCT 42.7 09/19/2018 1038   PLT 265 09/19/2018 1038   MCV 91 09/19/2018 1038   NEUTROABS 5.1 09/19/2018 1038   LYMPHSABS 2.0 09/19/2018 1038   MONOABS 0.8 09/30/2014 0244   EOSABS 0.3 09/19/2018 1038   BASOSABS 0.1 09/19/2018 1038   Comprehensive Metabolic Panel:    Component Value Date/Time   NA 136 09/19/2018 1038   K 3.8 09/19/2018 1038   CL 94 (L) 09/19/2018 1038   CO2 22 09/19/2018 1038   BUN 6 (L) 09/19/2018 1038   CREATININE 0.71 09/19/2018 1038   GLUCOSE 81 09/19/2018 1038   GLUCOSE 117 (H) 08/15/2018 0535   CALCIUM 9.8 09/19/2018 1038   AST 30 09/19/2018 1038   ALT 14 09/19/2018 1038   ALKPHOS 94 09/19/2018 1038   BILITOT 0.5 09/19/2018 1038   PROT 7.4 09/19/2018 1038   ALBUMIN 4.3 09/19/2018 1038     RADIOGRAPHIC STUDIES: Dg Chest 1 View  Result Date: 10/21/2018 CLINICAL DATA:  S/P bronch EXAM: CHEST  1 VIEW COMPARISON:  CT on 10/20/2018, chest x-ray 10/17/2018 FINDINGS: Persistent significant volume loss on the LEFT. There is increased opacity in the LEFT lung apex compared to prior studies following bronchoscopy. RIGHT paratracheal adenopathy again noted. The RIGHT lung remains clear. No pulmonary edema. IMPRESSION: Increased opacity in LEFT lung apex. Significant volume loss and mediastinal adenopathy. Electronically Signed   By: Nolon Nations M.D.   On: 10/21/2018 15:25   Dg Chest 2 View  Result Date: 10/17/2018 CLINICAL DATA:  Cough and dyspnea EXAM: CHEST - 2 VIEW COMPARISON:  09/30/2018 chest radiograph. FINDINGS: There is worsening volume loss in the left hemithorax with left mediastinal shift. There is worsening patchy consolidation throughout the mid to lower left lung with minimal residual aeration in the upper left lung. Clear right lung. No pneumothorax. No right pleural effusion. IMPRESSION: Worsening volume loss in the left hemithorax. Worsening patchy consolidation in the mid to lower left lung with minimal residual aeration in the upper left lung. Centrally obstructing left lung mass not excluded. Possible small left pleural effusion. Chest CT with IV contrast recommended for further evaluation. These results will be called to the ordering clinician or representative by the Radiologist Assistant, and communication documented in the PACS or zVision Dashboard. Electronically Signed   By: Ilona Sorrel M.D.   On: 10/17/2018 16:42   Ct Chest W Contrast  Result Date: 10/20/2018 CLINICAL DATA:  Increasing opacification of the left hemithorax with mediastinal shift to the left EXAM: CT CHEST WITH CONTRAST TECHNIQUE: Multidetector CT imaging of the chest was performed during intravenous contrast administration. CONTRAST:  46m OMNIPAQUE IOHEXOL 300 MG/ML  SOLN COMPARISON:  Chest  x-ray of 10/17/2017 and 07/20/2018 FINDINGS: Cardiovascular: The heart is within normal limits in size. There is extensive coronary artery calcification present. Mediastinal shift to the left is noted. Moderate thoracic aortic atherosclerosis is noted. Mediastinum/Nodes: There is a large mediastinal mass with considerable left hilar adenopathy/mass as well. This mass involves the mediastinum as well as both hila left much greater than right. On image 65 series 2 this mediastinal mass extending into  the left hilum measures 7.7 x 8.6 cm and is inhomogeneous in attenuation. Lungs/Pleura: On lung windows there is considerable volume loss throughout the left hemithorax as result of the large mediastinal and hilar mass left-greater-than-right creating narrowing of the bronchi and resultant atelectasis on the left. There is a moderate left pleural effusion present. A small calcified granuloma is present in the right upper lobe on sagittal image 42 series 6. And additional calcified granuloma is present in the right middle lobe on sagittal image 55. However, no suspicious right lung nodule is seen. Upper Abdomen: No definite liver metastasis are seen. Surgical clips are present prior cholecystectomy. The pancreas is atrophic and unremarkable. Musculoskeletal: There is a slight thoracic kyphosis present but no lytic or blastic lesion is seen. The ribs are unremarkable. IMPRESSION: 1. Large mediastinal mass involving both hila left much greater than right consistent with lung carcinoma possibly small cell lung carcinoma. This mass causes narrowing of the left mainstem bronchus with resultant volume loss on the left and mediastinal shift to the left. 2. Moderate size left pleural effusion. 3. Coronary artery calcifications and moderate thoracic aortic atherosclerosis. Electronically Signed   By: Ivar Drape M.D.   On: 10/20/2018 09:47   Mr Jeri Cos WS Contrast  Result Date: 10/28/2018 CLINICAL DATA:  72 year old female with  recently diagnosed large left lung mass. EXAM: MRI HEAD WITHOUT AND WITH CONTRAST TECHNIQUE: Multiplanar, multiecho pulse sequences of the brain and surrounding structures were obtained without and with intravenous contrast. CONTRAST:  6 milliliters Gadavist COMPARISON:  Noncontrast head and face CT 07/27/2016. FINDINGS: Brain: No abnormal enhancement identified. No midline shift, mass effect, or evidence of intracranial mass lesion. No dural thickening. No restricted diffusion to suggest acute infarction. No ventriculomegaly, extra-axial collection or acute intracranial hemorrhage. Cervicomedullary junction and pituitary are within normal limits. Patchy and confluent bilateral cerebral white matter T2 and FLAIR hyperintensity, including involvement in bilateral temporal lobe white matter. No cortical encephalomalacia or chronic cerebral blood products. Mild to moderate T2 heterogeneity in the bilateral deep gray matter nuclei and the pons. Negative cerebellum. Vascular: Loss of the distal left vertebral artery flow void on series 7, image 4. other major intracranial vascular flow voids are preserved. The major dural venous sinuses are enhancing and appear patent. Skull and upper cervical spine: Negative visible cervical spine and spinal cord. Visualized bone marrow signal is within normal limits. Sinuses/Orbits: Negative orbit soft tissues. Mild sphenoid sinus mucosal thickening, other sinuses are clear. Other: Mastoids are clear. Visible internal auditory structures appear normal. Scalp and face soft tissues appear negative. IMPRESSION: 1. No metastatic disease or acute intracranial abnormality identified. 2. 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 3. Moderately advanced but nonspecific cerebral white matter signal changes, most commonly due to chronic small vessel disease. Lesser signal changes in the deep gray matter and pons may also be small vessel related. Electronically Signed   By: Genevie Ann  M.D.   On: 10/28/2018 10:49   Nm Pet Image Initial (pi) Skull Base To Thigh  Result Date: 10/26/2018 CLINICAL DATA:  Initial treatment strategy for lung mass with mediastinal and hilar lymphadenopathy. EXAM: NUCLEAR MEDICINE PET SKULL BASE TO THIGH TECHNIQUE: 8.3 mCi F-18 FDG was injected intravenously. Full-ring PET imaging was performed from the skull base to thigh after the radiotracer. CT data was obtained and used for attenuation correction and anatomic localization. Fasting blood glucose: 91 mg/dl COMPARISON:  Chest CT 10/20/2018. FINDINGS: Mediastinal blood pool activity: SUV max 2.2 NECK: Bulky  lymphadenopathy in the right neck is markedly hypermetabolic. 1.7 cm short axis index lymph node in the right supraclavicular region is identified on 49/3. SUV max = 23.4. Incidental CT findings: none CHEST: The bulky confluent mediastinal and left hilar lymphadenopathy is markedly hypermetabolic. Anterior mediastinal nodal conglomeration demonstrates SUV max = 28.3 (70/3). Central mediastinal disease extending into the left hilum shows confluent hypermetabolism with SUV max = 29.4. Soft tissue in the posterior lower left hemithorax adjacent to the pleural fluid is markedly hypermetabolic with SUV max = 10.1. Incidental CT findings: Marked mass-effect/occlusion of the left mainstem bronchus again noted. Small left pleural effusion noted. ABDOMEN/PELVIS: Hypermetabolic lymphadenopathy is identified in the hepato duodenal ligament. 1.5 cm short axis portal caval lymph node (142/3) demonstrates SUV max = 17.5 Incidental CT findings: Gallbladder surgically absent. No adrenal nodule or mass. There is abdominal aortic atherosclerosis without aneurysm. Pelvic floor laxity evident. SKELETON: Despite lack of an underlying bony abnormality, there is marked hypermetabolism in the posterior right acetabulum with SUV max = 24.9. No other hypermetabolic bone lesions are identified. Incidental CT findings: none IMPRESSION: 1.  Large left lung mass is markedly hypermetabolic in the confluent lymphadenopathy in the mediastinum and in bilateral hilar regions also shows marked hypermetabolism. 2. Hypermetabolic metastatic lymphadenopathy in the right neck and supraclavicular region. 3. Hypermetabolic lymphadenopathy in the hepato duodenal ligament consistent with metastatic disease. 4. Large hypermetabolic lesion posterior right acetabulum without correlate of CT finding. Features highly suspicious for bony metastatic disease. Electronically Signed   By: Misty Stanley M.D.   On: 10/26/2018 15:22   Dg Chest Port 1 View  Result Date: 10/28/2018 CLINICAL DATA:  Status post Port-A-Cath placement. EXAM: PORTABLE CHEST 1 VIEW COMPARISON:  Radiographs of October 17, 2018. FINDINGS: There is nearly complete opacification of the left hemithorax which is progressed since prior exam. This is most consistent with atelectasis and associated pleural effusion. Significant right to left mediastinal shift is noted. There is been interval placement of right internal jugular Port-A-Cath with distal tip in expected position of the SVC. No pneumothorax is noted. Right lung is clear although hyperexpanded. Bony thorax is unremarkable. IMPRESSION: Interval placement of right internal jugular Port-A-Cath with distal tip in expected position of SVC. No pneumothorax is noted. There is now complete opacification of left hemithorax which is progressed compared to prior exam, most consistent with atelectasis and possible pleural effusion given the degree of mediastinal shift. Electronically Signed   By: Marijo Conception, M.D.   On: 10/28/2018 16:23   Dg C-arm 1-60 Min-no Report  Result Date: 10/28/2018 Fluoroscopy was utilized by the requesting physician.  No radiographic interpretation.   Dg C-arm 1-60 Min-no Report  Result Date: 10/21/2018 Fluoroscopy was utilized by the requesting physician.  No radiographic interpretation.    PERFORMANCE STATUS (ECOG) :  1 - Symptomatic but completely ambulatory  Review of Systems As noted above. Otherwise, a complete review of systems is negative.  Physical Exam General: NAD, frail appearing, thin Cardiovascular: regular rate and rhythm Pulmonary: clear ant fields, occasional cough Abdomen: soft, nontender, + bowel sounds Extremities: no edema, no joint deformities Skin: no rashes Neurological: Weakness but otherwise nonfocal Psych: tearful at times  IMPRESSION: I met with patient and her husband today in the clinic.  Patient was rather tearful at times as she discussed the suddenness of her cancer diagnosis.  Patient endorses generalized anxiety and depressive symptoms as she is coping with her illness.  We discussed support available in the cancer center including  support groups and therapy.  Patient would be agreeable to initiation of an antidepressant.  We will start low-dose Zoloft and titrate for effect.  Patient has poor oral intake recently.  She attributes this primarily to lack of appetite and difficulty swallowing.  She was being evaluated for possible XRT to help reduce the size of her chest mass, which is thought to be the source of her dysphagia.  However, it appears that radiation oncology is recommending initiation of systemic treatment first with consideration of XRT if needed in the future.  We will refer her to speech therapy to see if modification of her diet would help her symptoms.  Patient says she is eating about 25 to 30% of her meals.  She has some subjective weight loss.  We discussed the importance of high-protein and high-calorie meals.  I recommended smaller meals more frequently throughout the day.  We will also refer her to our dietitian.  Patient has some progressive weakness.  She is having some difficulty fully dressing herself.  We will have her see OT.  Patient has occasional pain and cough.  She verbalized some concerns with taking opioids due to fear of addiction.   However, patient says she has taken pain medications many times in the past without any issues.  I explained that opioids are frequently indicated in the management of cancer pain.  She is currently only using a half a tablet of the Norco once a day.  She says she has taken a full tablet in the past without any issues.  I recommended that she increase use as needed to better stabilize the pain.  We discussed her bowel regimen.  She has had some diarrhea associated with taking Colace.  She is also had some bowel incontinence.  Anticipate possible diarrhea associated with chemotherapy.  Patient makes it clear that she is interested in pursuing treatment of the cancer.  She has a goal of returning home to [place] to see her family.  She is leaving next week for this trip.  We will plan follow-up appointments following her return to [facility].  PLAN: 1. Start Zoloft 66m daily. Titrate for effect 2. Continue Norco prn. Recommend use at night to help with cough 3. Bowel regimen prn 4. Referral to ST for recommendations and management of dysphagia 5. Referral to OT for recommendations and management of weakness 6. Referral to RD for recommendations and management of weight loss 7. Will need to discuss ACP in future 8. RTC in 2-3 weeks  Patient expressed understanding and was in agreement with this plan. She also understands that She can call clinic at any time with any questions, concerns, or complaints.    Time Total: 30 minutes  Visit consisted of counseling and education dealing with the complex and emotionally intense issues of symptom management and palliative care in the setting of serious and potentially life-threatening illness.Greater than 50%  of this time was spent counseling and coordinating care related to the above assessment and plan.  Signed by: JAltha Harm PhD, DNP, NP-C, [phone](Work Cell)

## 2018-11-01 NOTE — Consult Note (Signed)
NEW PATIENT EVALUATION  Name: Kristi [name]  MRN: [id number]  Date:   11/01/2018     DOB: 05-Jun-1946   This 73 y.o. female patient presents to the clinic for initial evaluation of stage IV small cell lung cancer for consideration of palliative radiation therapy to chest.  REFERRING PHYSICIAN: Glean Hess, MD  CHIEF COMPLAINT:  Chief Complaint  Patient presents with  . Lung Cancer    Pt is here for initial consultation    DIAGNOSIS: The encounter diagnosis was Small cell lung cancer ([place]).   PREVIOUS INVESTIGATIONS:  CT scans PET/CT scans and MRI of brain all reviewed Pathology reviewed Clinical notes reviewed  HPI: patient is a 73 year old female who presented with increasing shortness of breath dyspnea on exertion and cough and was seen by pulmonology. She has a 40 pack year smoking history. Chest x-ray on 09/30/2018 showed volume loss in the left chest worrisome for central obstructing mass.CT scan confirmeda large left lung mass with marked mediastinal adenopathy involvement of both hilar regions with now ringing of the left mainstem bronchus. Bronchoscopy was performed November 1 showing small cell lung cancer.PET CT scan demonstrated stage IV disease with lymphadenopathy in the right neck and supraclavicular region. Also hepatoduodenal ligament metastatic disease and possible metastatic disease in the right acetabulum. There was a large left lung mass which was markedly hypermetabolic with bilateral hilar adenopathy and mediastinal adenopathy all consistent with extensive stage small cell lung cancer. Patient is scheduled to have a port placed. MRI of her brain showed no evidence of metastatic disease. She is scheduled to start chemotherapy tomorrow.she is being planned forAtezolizumab CarboplatinEtoposide . I been asked to evaluate her for possible palliative radiation therapy therapy to her chest. She's having some minimal dysphagia at this time.  PLANNED TREATMENT  REGIMEN: initiate chemotherapy and observe for response with possible palliative radiation therapy to her mediastinum  PAST MEDICAL HISTORY:  has a past medical history of Acute MI, inferoposterior wall ([place]) (09/30/2014), Claustrophobia, Coronary artery disease, Hypercholesteremia, Hypertension, MI, old, and 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 [facility]) (10/27/2018).    PAST SURGICAL HISTORY:  Past Surgical History:  Procedure Laterality Date  . APPENDECTOMY     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  . BLADDER NECK SUSPENSION    . CHOLECYSTECTOMY N/A 08/14/2018   Procedure: LAPAROSCOPIC CHOLECYSTECTOMY;  Surgeon: Jules Husbands, MD;  Location: [facility] ORS;  Service: General;  Laterality: N/A;  . CORONARY STENT PLACEMENT    . ENDOBRONCHIAL ULTRASOUND N/A 10/21/2018   Procedure: ENDOBRONCHIAL ULTRASOUND;  Surgeon: Laverle Hobby, MD;  Location: [facility] ORS;  Service: Pulmonary;  Laterality: N/A;  . HERNIA REPAIR    . LEFT HEART CATHETERIZATION WITH CORONARY ANGIOGRAM N/A 09/29/2014   Procedure: LEFT HEART CATHETERIZATION WITH CORONARY ANGIOGRAM;  Surgeon: Clent Demark, MD;  Location: [facility] CATH LAB;  Service: Cardiovascular;  Laterality: N/A;  . PORTACATH PLACEMENT Right 10/28/2018   Procedure: INSERTION PORT-A-CATH;  Surgeon: Jules Husbands, MD;  Location: [facility] ORS;  Service: General;  Laterality: Right;    FAMILY HISTORY: family history includes Alzheimer's disease in her mother; Colon cancer in her father.  SOCIAL HISTORY:  reports that she quit smoking about 2 weeks ago. Her smoking use included cigarettes. She started smoking about 39 years ago. She has a 39.00 pack-year smoking history. She has never used smokeless tobacco. She reports that she drinks alcohol. She reports that she does not use drugs.  ALLERGIES: Patient has no known allergies.  MEDICATIONS:  Current Outpatient  Medications  Medication Sig Dispense Refill  . albuterol (PROAIR HFA) 108 (90 Base) MCG/ACT inhaler Inhale 2 puffs into the  lungs every 6 (six) hours as needed for wheezing or shortness of breath. 18 g 2  . ALPRAZolam (XANAX) 0.5 MG tablet Take medication with you to MRI. Take 1 tablet prior to MRI. (Patient not taking: Reported on 11/01/2018) 5 tablet 0  . AMBULATORY NON FORMULARY MEDICATION Medication Name: Please provide patient with Nebulizer. DX: J44.9 1 each 0  . aspirin EC 81 MG tablet Take 81 mg by mouth daily.    [name] Kitchen atorvastatin (LIPITOR) 80 MG tablet Take 0.5 tablets (40 mg total) by mouth daily at 6 PM. (Patient taking differently: Take 80 mg by mouth at bedtime. ) 30 tablet 3  . B Complex Vitamins (VITAMIN B COMPLEX PO) Take 1 tablet by mouth daily.     . benzonatate (TESSALON PERLES) 100 MG capsule Take 2 capsules (200 mg total) by mouth 3 (three) times daily. 90 capsule 2  . CALCIUM PO Take 1 tablet by mouth daily.     . cetirizine (ZYRTEC) 10 MG tablet Take 10 mg by mouth daily.    . Cholecalciferol (VITAMIN D) 2000 units CAPS Take 1 capsule (2,000 Units total) by mouth daily. 30 capsule   . docusate sodium (COLACE) 100 MG capsule Take 100 mg by mouth daily.    . Fluticasone-Umeclidin-Vilant (TRELEGY ELLIPTA) 100-62.5-25 MCG/INH AEPB Inhale 1 puff into the lungs daily. 1 each 12  . HYDROcodone-acetaminophen (HYCET) 7.5-325 mg/15 ml solution Take 10 mLs by mouth at bedtime for 14 days. 120 mL 0  . HYDROcodone-acetaminophen (NORCO/VICODIN) 5-325 MG tablet Take 1 tablet by mouth every 6 (six) hours as needed for moderate pain. 20 tablet 0  . ipratropium-albuterol (DUONEB) 0.5-2.5 (3) MG/3ML SOLN Take 3 mLs by nebulization 3 (three) times daily. DX. J44.9 360 mL 1  . lidocaine-prilocaine (EMLA) cream Apply to affected area once 30 g 3  . metoprolol tartrate (LOPRESSOR) 25 MG tablet Take 12.5 mg by mouth 2 (two) times daily.    . nitroGLYCERIN (NITROSTAT) 0.4 MG SL tablet Place 1 tablet (0.4 mg total) under the tongue every 5 (five) minutes x 3 doses as needed for chest pain. 25 tablet 12  . ondansetron  (ZOFRAN) 8 MG tablet Take 1 tablet (8 mg total) by mouth 2 (two) times daily as needed for refractory nausea / vomiting. Start on day 3 after carboplatin chemo. 30 tablet 1  . prochlorperazine (COMPAZINE) 10 MG tablet TAKE 1 TABLET BY MOUTH EVERY 6 HOURS AS NEEDED FOR NAUSEA OR VOMITING 337 tablet 1   No current facility-administered medications for this encounter.     ECOG PERFORMANCE STATUS:  1 - Symptomatic but completely ambulatory  REVIEW OF SYSTEMS: except for the cough weight loss and shortness of breath Patient denies any weight loss, fatigue, weakness, fever, chills or night sweats. Patient denies any loss of vision, blurred vision. Patient denies any ringing  of the ears or hearing loss. No irregular heartbeat. Patient denies heart murmur or history of fainting. Patient denies any chest pain or pain radiating to her upper extremities. Patient denies any shortness of breath, difficulty breathing at night, cough or hemoptysis. Patient denies any swelling in the lower legs. Patient denies any nausea vomiting, vomiting of blood, or coffee ground material in the vomitus. Patient denies any stomach pain. Patient states has had normal bowel movements no significant constipation or diarrhea. Patient denies any dysuria, hematuria or significant nocturia. Patient  denies any problems walking, swelling in the joints or loss of balance. Patient denies any skin changes, loss of hair or loss of weight. Patient denies any excessive worrying or anxiety or significant depression. Patient denies any problems with insomnia. Patient denies excessive thirst, polyuria, polydipsia. Patient denies any swollen glands, patient denies easy bruising or easy bleeding. Patient denies any recent infections, allergies or URI. Patient "s visual fields have not changed significantly in recent time.    PHYSICAL EXAM: BP (!) 84/60   Temp (!) 96.3 F (35.7 C) (Tympanic)   Wt 148 lb 7.7 oz (67.4 kg)   BMI 24.71 kg/m  Patient  does have prominent right supra clavicular adenopathy. Patient does have decreased breath sounds in her left lower lobe.Well-developed well-nourished patient in NAD. HEENT reveals PERLA, EOMI, discs not visualized.  Oral cavity is clear. No oral mucosal lesions are identified. Neck is clear without evidence of cervical or supraclavicular adenopathy. Lungs are clear to A&P. Cardiac examination is essentially unremarkable with regular rate and rhythm without murmur rub or thrill. Abdomen is benign with no organomegaly or masses noted. Motor sensory and DTR levels are equal and symmetric in the upper and lower extremities. Cranial nerves II through XII are grossly intact. Proprioception is intact. No peripheral adenopathy or edema is identified. No motor or sensory levels are noted. Crude visual fields are within normal range.  LABORATORY DATA: pathology reports reviewed    RADIOLOGY RESULTS:MRI scans PET/CT scans and CT scans reviewed   IMPRESSION: extensive stage small cell lung cancer ith marked adenopathy in the mediastinum occluding left mainstem bronchus in 72 year old female  PLAN: I discussed the case personally with medical oncology. I would favor initiating chemotherapy watching for response. After one month if there is no improvement or her symptoms worsen I certainly would favor a short course of radiation therapy to her chest to help with palliation and possibility some the left mainstem STEM obstruction.Risks and benefits of radiation including skin reaction fatigue alteration of blood counts possible radiation esophagitis all were discussed with the patient and her husband.I have not given the patient a follow-up appointmentwill see her at medical oncology's discretion. Patient husband both copying my treatment plan well.  I would like to take this opportunity to thank you for allowing me to participate in the care of your patient.[name], MD

## 2018-11-02 ENCOUNTER — Other Ambulatory Visit: Payer: Self-pay | Admitting: Oncology

## 2018-11-02 ENCOUNTER — Other Ambulatory Visit: Payer: Self-pay

## 2018-11-02 ENCOUNTER — Inpatient Hospital Stay: Payer: Medicare Other

## 2018-11-02 ENCOUNTER — Inpatient Hospital Stay: Payer: Medicare Other | Admitting: Occupational Therapy

## 2018-11-02 ENCOUNTER — Inpatient Hospital Stay (HOSPITAL_BASED_OUTPATIENT_CLINIC_OR_DEPARTMENT_OTHER): Payer: Medicare Other | Admitting: Oncology

## 2018-11-02 ENCOUNTER — Encounter: Payer: Self-pay | Admitting: Oncology

## 2018-11-02 VITALS — BP 101/82 | HR 83 | Temp 96.6°F | Resp 20 | Ht 65.0 in | Wt 145.5 lb

## 2018-11-02 DIAGNOSIS — C3490 Malignant neoplasm of unspecified part of unspecified bronchus or lung: Secondary | ICD-10-CM

## 2018-11-02 DIAGNOSIS — Z87891 Personal history of nicotine dependence: Secondary | ICD-10-CM | POA: Diagnosis not present

## 2018-11-02 DIAGNOSIS — C3402 Malignant neoplasm of left main bronchus: Secondary | ICD-10-CM | POA: Diagnosis not present

## 2018-11-02 DIAGNOSIS — Z5111 Encounter for antineoplastic chemotherapy: Secondary | ICD-10-CM | POA: Insufficient documentation

## 2018-11-02 DIAGNOSIS — J9610 Chronic respiratory failure, unspecified whether with hypoxia or hypercapnia: Secondary | ICD-10-CM | POA: Diagnosis not present

## 2018-11-02 DIAGNOSIS — J90 Pleural effusion, not elsewhere classified: Secondary | ICD-10-CM | POA: Diagnosis not present

## 2018-11-02 DIAGNOSIS — R05 Cough: Secondary | ICD-10-CM

## 2018-11-02 DIAGNOSIS — E876 Hypokalemia: Secondary | ICD-10-CM

## 2018-11-02 DIAGNOSIS — G893 Neoplasm related pain (acute) (chronic): Secondary | ICD-10-CM | POA: Diagnosis not present

## 2018-11-02 DIAGNOSIS — E871 Hypo-osmolality and hyponatremia: Secondary | ICD-10-CM

## 2018-11-02 DIAGNOSIS — D649 Anemia, unspecified: Secondary | ICD-10-CM

## 2018-11-02 DIAGNOSIS — Z7189 Other specified counseling: Secondary | ICD-10-CM

## 2018-11-02 DIAGNOSIS — J439 Emphysema, unspecified: Secondary | ICD-10-CM | POA: Diagnosis not present

## 2018-11-02 DIAGNOSIS — Z9981 Dependence on supplemental oxygen: Secondary | ICD-10-CM

## 2018-11-02 DIAGNOSIS — R5383 Other fatigue: Secondary | ICD-10-CM

## 2018-11-02 DIAGNOSIS — R1310 Dysphagia, unspecified: Secondary | ICD-10-CM | POA: Diagnosis not present

## 2018-11-02 DIAGNOSIS — R0602 Shortness of breath: Secondary | ICD-10-CM

## 2018-11-02 DIAGNOSIS — J441 Chronic obstructive pulmonary disease with (acute) exacerbation: Secondary | ICD-10-CM

## 2018-11-02 LAB — CBC WITH DIFFERENTIAL/PLATELET
Abs Immature Granulocytes: 0.27 10*3/uL — ABNORMAL HIGH (ref 0.00–0.07)
Basophils Absolute: 0.1 10*3/uL (ref 0.0–0.1)
Basophils Relative: 1 %
EOS ABS: 0.3 10*3/uL (ref 0.0–0.5)
EOS PCT: 2 %
HEMATOCRIT: 37.3 % (ref 36.0–46.0)
HEMOGLOBIN: 12.4 g/dL (ref 12.0–15.0)
Immature Granulocytes: 2 %
LYMPHS ABS: 1.6 10*3/uL (ref 0.7–4.0)
LYMPHS PCT: 14 %
MCH: 28.4 pg (ref 26.0–34.0)
MCHC: 33.2 g/dL (ref 30.0–36.0)
MCV: 85.6 fL (ref 80.0–100.0)
MONOS PCT: 9 %
Monocytes Absolute: 1 10*3/uL (ref 0.1–1.0)
Neutro Abs: 8.4 10*3/uL — ABNORMAL HIGH (ref 1.7–7.7)
Neutrophils Relative %: 72 %
Platelets: 286 10*3/uL (ref 150–400)
RBC: 4.36 MIL/uL (ref 3.87–5.11)
RDW: 13.2 % (ref 11.5–15.5)
WBC: 11.6 10*3/uL — ABNORMAL HIGH (ref 4.0–10.5)
nRBC: 0 % (ref 0.0–0.2)

## 2018-11-02 LAB — COMPREHENSIVE METABOLIC PANEL
ALT: 13 U/L (ref 0–44)
AST: 33 U/L (ref 15–41)
Albumin: 3.2 g/dL — ABNORMAL LOW (ref 3.5–5.0)
Alkaline Phosphatase: 109 U/L (ref 38–126)
Anion gap: 11 (ref 5–15)
BUN: 9 mg/dL (ref 8–23)
CALCIUM: 9.3 mg/dL (ref 8.9–10.3)
CHLORIDE: 95 mmol/L — AB (ref 98–111)
CO2: 26 mmol/L (ref 22–32)
CREATININE: 0.56 mg/dL (ref 0.44–1.00)
Glucose, Bld: 97 mg/dL (ref 70–99)
Potassium: 3.3 mmol/L — ABNORMAL LOW (ref 3.5–5.1)
SODIUM: 132 mmol/L — AB (ref 135–145)
Total Bilirubin: 0.5 mg/dL (ref 0.3–1.2)
Total Protein: 7.5 g/dL (ref 6.5–8.1)

## 2018-11-02 LAB — TSH: TSH: 3.603 u[IU]/mL (ref 0.350–4.500)

## 2018-11-02 MED ORDER — PALONOSETRON HCL INJECTION 0.25 MG/5ML
0.2500 mg | Freq: Once | INTRAVENOUS | Status: AC
  Administered 2018-11-02: 0.25 mg via INTRAVENOUS
  Filled 2018-11-02: qty 5

## 2018-11-02 MED ORDER — HEPARIN SOD (PORK) LOCK FLUSH 100 UNIT/ML IV SOLN
500.0000 [IU] | Freq: Once | INTRAVENOUS | Status: AC
  Administered 2018-11-02: 500 [IU] via INTRAVENOUS
  Filled 2018-11-02: qty 5

## 2018-11-02 MED ORDER — SODIUM CHLORIDE 0.9 % IV SOLN
Freq: Once | INTRAVENOUS | Status: AC
  Administered 2018-11-02: 10:00:00 via INTRAVENOUS
  Filled 2018-11-02: qty 250

## 2018-11-02 MED ORDER — DEXAMETHASONE 4 MG PO TABS: ORAL_TABLET | ORAL | 1 refills | Status: DC

## 2018-11-02 MED ORDER — SODIUM CHLORIDE 0.9 % IV SOLN
100.0000 mg/m2 | Freq: Once | INTRAVENOUS | Status: AC
  Administered 2018-11-02: 180 mg via INTRAVENOUS
  Filled 2018-11-02: qty 9

## 2018-11-02 MED ORDER — SODIUM CHLORIDE 0.9% FLUSH
10.0000 mL | Freq: Once | INTRAVENOUS | Status: AC
  Administered 2018-11-02: 10 mL via INTRAVENOUS
  Filled 2018-11-02: qty 10

## 2018-11-02 MED ORDER — SODIUM CHLORIDE 0.9 % IV SOLN
Freq: Once | INTRAVENOUS | Status: AC
  Administered 2018-11-02: 10:00:00 via INTRAVENOUS
  Filled 2018-11-02: qty 5

## 2018-11-02 MED ORDER — SODIUM CHLORIDE 0.9 % IV SOLN
394.5000 mg | Freq: Once | INTRAVENOUS | Status: AC
  Administered 2018-11-02: 390 mg via INTRAVENOUS
  Filled 2018-11-02: qty 39

## 2018-11-02 MED ORDER — HEPARIN SOD (PORK) LOCK FLUSH 100 UNIT/ML IV SOLN: 500.0000 [IU] | Freq: Once | INTRAVENOUS | Status: DC | PRN

## 2018-11-02 MED ORDER — POTASSIUM CHLORIDE CRYS ER 20 MEQ PO TBCR: 20.0000 meq | EXTENDED_RELEASE_TABLET | Freq: Every day | ORAL | 1 refills | Status: DC

## 2018-11-02 MED ORDER — CHLORHEXIDINE GLUCONATE 0.12 % MT SOLN: 15.0000 mL | Freq: Two times a day (BID) | OROMUCOSAL | 0 refills | Status: DC

## 2018-11-02 MED ORDER — TRAMADOL HCL 50 MG PO TABS: 50.0000 mg | ORAL_TABLET | Freq: Four times a day (QID) | ORAL | 0 refills | Status: DC | PRN

## 2018-11-02 NOTE — Progress Notes (Signed)
Patient here today for follow up and first chemotherapy infusion

## 2018-11-02 NOTE — Therapy (Unsigned)
[facility] [address] [place], [place], [place], [address] Phone: [phone]   Fax:  [phone]  Occupational Therapy Screen   Patient Details  Name: Kristi [name] MRN: [id number] Date of Birth: 01-05-1946 No data recorded  Encounter Date: 11/02/2018    Past Medical History:  Diagnosis Date  . Acute MI, inferoposterior wall ([place]) 09/30/2014  . Claustrophobia   . Coronary artery disease   . Hypercholesteremia   . Hypertension   . MI, old   . 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 [facility]) 10/27/2018    Past Surgical History:  Procedure Laterality Date  . APPENDECTOMY     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  . BLADDER NECK SUSPENSION    . CHOLECYSTECTOMY N/A 08/14/2018   Procedure: LAPAROSCOPIC CHOLECYSTECTOMY;  Surgeon: Jules Husbands, MD;  Location: [facility] ORS;  Service: General;  Laterality: N/A;  . CORONARY STENT PLACEMENT    . ENDOBRONCHIAL ULTRASOUND N/A 10/21/2018   Procedure: ENDOBRONCHIAL ULTRASOUND;  Surgeon: Laverle Hobby, MD;  Location: [facility] ORS;  Service: Pulmonary;  Laterality: N/A;  . HERNIA REPAIR    . LEFT HEART CATHETERIZATION WITH CORONARY ANGIOGRAM N/A 09/29/2014   Procedure: LEFT HEART CATHETERIZATION WITH CORONARY ANGIOGRAM;  Surgeon: Clent Demark, MD;  Location: [facility] CATH LAB;  Service: Cardiovascular;  Laterality: N/A;  . PORTACATH PLACEMENT Right 10/28/2018   Procedure: INSERTION PORT-A-CATH;  Surgeon: Jules Husbands, MD;  Location: [facility] ORS;  Service: General;  Laterality: Right;    There were no vitals filed for this visit.  Subjective Assessment - 11/02/18 0920    Subjective   I am anxious to start chemo - and had one type of fall the last 6 months - my leg was little numb after sitting for while - and it gave away -         Pt arrive this date for appt with me for rehab screen and with Dr Tasia Catchings  , and starting chemo.   Palliative Care consult was done yesterday and pt was refer to OT and SLP  consult. Pt is a  72 y.o. female with multiple medical problems including stage IV small cell lung cancer.  PMH also notable for CAD with history of acute MI, claustrophobia, hypertension, and hyperlipidemia.  Patient was recently seen and examined by pulmonology due to worsening exertional dyspnea.  She was found to have a central obstructing mass and mediastinal adenopathy on CT.  Patient is status post bronchoscopy with biopsy confirming small cell lung cancer.  PET CT scan revealed hypermetabolic areas involving lymphadenopathy in the neck and supraclavicular region, hepatoduodenal ligament, and disease of the right acetabulum.  Patient is pending initiation of chemotherapy with Atezolizumab, Carboplatin, and Etoposide.  Additionally she is being considered for palliative XRT of the chest mass to help with dysphagia.     Ask SLP to come and consult this am with pt about dysphagia because pt is leaving for [place] trip and wanted her to have some information.  Pt anxious to start chemo this am and because of  time line- held off on OT screen for weakness, balance and energy conservation until she is back from her [place] trip.  Ask nsg to schedule her with me in 2-3 wks when back from trip.                             Patient will benefit from skilled therapeutic intervention in  order to improve the following deficits and impairments:     Visit Diagnosis: COPD with exacerbation [facility])    Problem List Patient Active Problem List   Diagnosis Date Noted  . Cough 10/28/2018  . Dysphagia 10/28/2018  . Decrease in appetite 10/28/2018  . Goals of care, counseling/discussion 10/28/2018  . Small cell lung cancer ([place]) 10/27/2018  . Grade I diastolic dysfunction 61/47/0929  . Localized edema 08/29/2018  . Cholecystitis 08/13/2018  . COPD with exacerbation ([place]) 08/11/2018  . Osteopenia determined by x-ray 05/19/2018  . Prediabetes 02/08/2018  . Hx of fracture of radius  08/18/2016  . Vitamin D deficiency 01/07/2016  . Hyperlipidemia 01/02/2016  . Hypertension 01/02/2016  . Chronic pain 01/02/2016  . Obesity (BMI 30.0-34.9) 01/02/2016  . Coronary artery disease with stable angina pectoris ([place]) 01/02/2016  . IBS (irritable bowel syndrome) 01/02/2016  . FH: colon cancer 01/02/2016  . DDD (degenerative disc disease), cervical 01/02/2016  . Primary osteoarthritis of left knee 01/02/2016  . Tobacco use disorder 01/02/2016    Rosalyn Gess OTR/L,CLT 11/02/2018, 9:23 AM  [facility] [address] [place], [place], [place], [address] Phone: [phone]   Fax:  [phone]  Name: Kristi [name] MRN: [id number] Date of Birth: 03-10-1946

## 2018-11-02 NOTE — Progress Notes (Addendum)
Hematology/Oncology Consult note [facility] Telephone:([phone] [phone]   Patient Care Team: Glean Hess, MD as PCP - General (Internal Medicine) Charolette Forward, MD as Consulting Physician (Cardiology) Telford Nab, RN as Registered Nurse  REFERRING PROVIDER: Dr. 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 Patient recently was referred to see pulmonology Dr. Felicie Morn for persistent cough and dyspnea.  Patient reports coughing up clear sputum for about 2 months.  No hemoptysis.  She also had weight loss. Former smoker, quitting smoking 2 weeks ago.  39-pack-year smoking history  09/30/2018 chest x-ray showed new market volume loss in the left chest area of the left mainstem bronchus worrisome for centrally obstructing mass. 10/17/2018 chest x-ray showed volume loss worsened in the left hemithorax worsening patchy consolidation in the mid to lower left lung with minimal residual aeration and up in the left lung mass not excluded.  Possible small left pleural effusion. 10/31 2019 CT chest with contrast showed large mediastinal mass involving both hilar, left greater than right, consistent with lung carcinoma The mass causes narrowing of the left mainstem bronchus with resultant volume loss on the left and a mediastinal shift to the left.  Moderate size left pleural effusion. 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  Today patient was accompanied by husband and daughter to clinic to discuss diagnosis and management plan. She reports feeling tired feels shortness of breath with exertion.  Persistent coughing, productive with clear sputum. Also reports difficulty  swallowing, feels food sticking in her food pipe.  INTERVAL HISTORY Kristi [name] is a 72 y.o. female who has above history reviewed by me today presents for follow up visit for management of extensive small cell lung cancer.  During the interval, she has had medi port placed by Dr.Pabon.  MRI brain was obtained which was negative for intracranial metastatic disease.  She has also been to chemotherapy class as well.  Was also seen by Dr.Chrystal. Dr.Chrystal will hold radiation for now and let her get 1-2 cycles of chemotherapy, if still symptomatic, he will proceed with palliative RT.   Continues to feel SOB, cough, feeling tired. No new complaints.   Review of Systems  Constitutional: Positive for malaise/fatigue. Negative for chills and fever.  HENT: Negative for nosebleeds and sore throat.   Eyes: Negative for redness.  Respiratory: Positive for cough, sputum production and shortness of breath. Negative for wheezing.   Cardiovascular: Negative for chest pain, palpitations and orthopnea.  Gastrointestinal: Negative for abdominal pain, blood in stool, nausea and vomiting.  Genitourinary: Negative for dysuria.  Musculoskeletal: Negative for myalgias and neck pain.  Skin: Negative for itching and rash.  Neurological: Negative for headaches.  Endo/Heme/Allergies: Negative for environmental allergies. Does not bruise/bleed easily.  Psychiatric/Behavioral: Negative for hallucinations. The patient is nervous/anxious.     MEDICAL HISTORY:  Past Medical History:  Diagnosis Date  . Acute MI, inferoposterior wall ([place]) 09/30/2014  . Claustrophobia   . Coronary artery disease   . Hypercholesteremia   . Hypertension   . MI, old   . 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 [facility]) 10/27/2018    SURGICAL HISTORY: Past Surgical History:  Procedure Laterality Date  . APPENDECTOMY     benign tumor on  liver found  . BLADDER NECK SUSPENSION    . CHOLECYSTECTOMY N/A 08/14/2018   Procedure:  LAPAROSCOPIC CHOLECYSTECTOMY;  Surgeon: Jules Husbands, MD;  Location: [facility] ORS;  Service: General;  Laterality: N/A;  . CORONARY STENT PLACEMENT    . ENDOBRONCHIAL ULTRASOUND N/A 10/21/2018   Procedure: ENDOBRONCHIAL ULTRASOUND;  Surgeon: Laverle Hobby, MD;  Location: [facility] ORS;  Service: Pulmonary;  Laterality: N/A;  . HERNIA REPAIR    . LEFT HEART CATHETERIZATION WITH CORONARY ANGIOGRAM N/A 09/29/2014   Procedure: LEFT HEART CATHETERIZATION WITH CORONARY ANGIOGRAM;  Surgeon: Clent Demark, MD;  Location: [facility] CATH LAB;  Service: Cardiovascular;  Laterality: N/A;  . PORTACATH PLACEMENT Right 10/28/2018   Procedure: INSERTION PORT-A-CATH;  Surgeon: Jules Husbands, MD;  Location: [facility] ORS;  Service: General;  Laterality: Right;    SOCIAL HISTORY: Social History   Socioeconomic History  . Marital status: Married    Spouse name: Not on file  . Number of children: Not on file  . Years of education: Not on file  . Highest education level: Not on file  Occupational History  . Not on file  Social Needs  . Financial resource strain: Not on file  . Food insecurity:    Worry: Not on file    Inability: Not on file  . Transportation needs:    Medical: Not on file    Non-medical: Not on file  Tobacco Use  . Smoking status: Former Smoker    Packs/day: 1.00    Years: 39.00    Pack years: 39.00    Types: Cigarettes    Start date: 12/21/1978    Last attempt to quit: 10/16/2018    Years since quitting: 0.0  . Smokeless tobacco: Never Used  Substance and Sexual Activity  . Alcohol use: Yes    Alcohol/week: 0.0 standard drinks    Comment: occassional - approx 1 every 2 weeks   . Drug use: No  . Sexual activity: Yes    Birth control/protection: None  Lifestyle  . Physical activity:    Days per week: Not on file    Minutes per session: Not on file  . Stress: Not on file  Relationships  . Social connections:    Talks on phone: Not on file    Gets together: Not on file    Attends  religious service: Not on file    Active member of club or organization: Not on file    Attends meetings of clubs or organizations: Not on file    Relationship status: Not on file  . Intimate partner violence:    Fear of current or ex partner: Not on file    Emotionally abused: Not on file    Physically abused: Not on file    Forced sexual activity: Not on file  Other Topics Concern  . Not on file  Social History Narrative  . Not on file    FAMILY HISTORY: Family History  Problem Relation Age of Onset  . Alzheimer's disease Mother   . Colon cancer Father   . Breast cancer Neg Hx     ALLERGIES:  has No Known Allergies.  MEDICATIONS:  Current Outpatient Medications  Medication Sig Dispense Refill  . albuterol (PROAIR HFA) 108 (90 Base) MCG/ACT inhaler Inhale 2 puffs into the lungs every 6 (six) hours as needed for wheezing or shortness of breath. 18 g 2  . ALPRAZolam (XANAX) 0.5 MG tablet Take medication with you to MRI. Take 1 tablet prior to MRI.  5 tablet 0  . AMBULATORY NON FORMULARY MEDICATION Medication Name: Please provide patient with Nebulizer. DX: J44.9 1 each 0  . aspirin EC 81 MG tablet Take 81 mg by mouth daily.    [name] Kitchen atorvastatin (LIPITOR) 80 MG tablet Take 0.5 tablets (40 mg total) by mouth daily at 6 PM. (Patient taking differently: Take 80 mg by mouth at bedtime. ) 30 tablet 3  . B Complex Vitamins (VITAMIN B COMPLEX PO) Take 1 tablet by mouth daily.     . benzonatate (TESSALON PERLES) 100 MG capsule Take 2 capsules (200 mg total) by mouth 3 (three) times daily. 90 capsule 2  . CALCIUM PO Take 1 tablet by mouth daily.     . cetirizine (ZYRTEC) 10 MG tablet Take 10 mg by mouth daily.    . Cholecalciferol (VITAMIN D) 2000 units CAPS Take 1 capsule (2,000 Units total) by mouth daily. 30 capsule   . docusate sodium (COLACE) 100 MG capsule Take 100 mg by mouth daily.    . Fluticasone-Umeclidin-Vilant (TRELEGY ELLIPTA) 100-62.5-25 MCG/INH AEPB Inhale 1 puff into the  lungs daily. 1 each 12  . HYDROcodone-acetaminophen (HYCET) 7.5-325 mg/15 ml solution Take 10 mLs by mouth at bedtime for 14 days. 120 mL 0  . HYDROcodone-acetaminophen (NORCO/VICODIN) 5-325 MG tablet Take 1 tablet by mouth every 6 (six) hours as needed for moderate pain. 20 tablet 0  . ipratropium-albuterol (DUONEB) 0.5-2.5 (3) MG/3ML SOLN Take 3 mLs by nebulization 3 (three) times daily. DX. J44.9 360 mL 1  . lidocaine-prilocaine (EMLA) cream Apply to affected area once 30 g 3  . metoprolol tartrate (LOPRESSOR) 25 MG tablet Take 12.5 mg by mouth 2 (two) times daily.    . nitroGLYCERIN (NITROSTAT) 0.4 MG SL tablet Place 1 tablet (0.4 mg total) under the tongue every 5 (five) minutes x 3 doses as needed for chest pain. 25 tablet 12  . ondansetron (ZOFRAN) 8 MG tablet Take 1 tablet (8 mg total) by mouth 2 (two) times daily as needed for refractory nausea / vomiting. Start on day 3 after carboplatin chemo. 30 tablet 1  . prochlorperazine (COMPAZINE) 10 MG tablet TAKE 1 TABLET BY MOUTH EVERY 6 HOURS AS NEEDED FOR NAUSEA OR VOMITING 337 tablet 1  . sertraline (ZOLOFT) 25 MG tablet Take 1 tablet (25 mg total) by mouth daily. 30 tablet 2  . chlorhexidine (PERIDEX) 0.12 % solution Use as directed 15 mLs in the mouth or throat 2 (two) times daily. 473 mL 0  . dexamethasone (DECADRON) 4 MG tablet Take 8mg  daily for 2 days after chemtherapy treatments. 30 tablet 1  . potassium chloride SA (K-DUR,KLOR-CON) 20 MEQ tablet Take 1 tablet (20 mEq total) by mouth daily. 30 tablet 1  . traMADol (ULTRAM) 50 MG tablet Take 1 tablet (50 mg total) by mouth every 6 (six) hours as needed. 10 tablet 0   No current facility-administered medications for this visit.    Facility-Administered Medications Ordered in Other Visits  Medication Dose Route Frequency Provider Last Rate Last Dose  . heparin lock flush 100 unit/mL  500 Units Intracatheter Once PRN Earlie Server, MD         PHYSICAL EXAMINATION: ECOG PERFORMANCE STATUS:  1 - Symptomatic but completely ambulatory Vitals:   11/02/18 0838  BP: 101/82  Pulse: 83  Resp: 20  Temp: (!) 96.6 F (35.9 C)  SpO2: 93%   Filed Weights   11/02/18 0838  Weight: 145 lb 8 oz (66 kg)    Physical Exam  Constitutional: She is oriented to person, place, and time. No distress.  Thin  HENT:  Head: Atraumatic.  Mouth/Throat: Oropharynx is clear and moist.  Eyes: Pupils are equal, round, and reactive to light. EOM are normal. No scleral icterus.  Neck: Normal range of motion. Neck supple.  Cardiovascular: Normal rate, regular rhythm and normal heart sounds.  No murmur heard. Pulmonary/Chest: Effort normal. No respiratory distress. She has no wheezes.  Decreased breath sounds bilaterally  Abdominal: Soft. Bowel sounds are normal. She exhibits no distension. There is no tenderness.  Musculoskeletal: Normal range of motion. She exhibits no edema or deformity.  Neurological: She is alert and oriented to person, place, and time. No cranial nerve deficit.  Skin: Skin is warm and dry. No rash noted. No erythema.  Psychiatric: She has a normal mood and affect.     LABORATORY DATA:  I have reviewed the data as listed Lab Results  Component Value Date   WBC 11.6 (H) 11/02/2018   HGB 12.4 11/02/2018   HCT 37.3 11/02/2018   MCV 85.6 11/02/2018   PLT 286 11/02/2018   Recent Labs    08/12/18 1438  08/29/18 1041 09/19/18 1038 11/02/18 0810  NA 131*   < > 137 136 132*  K 3.7   < > 3.5 3.8 3.3*  CL 97*   < > 95* 94* 95*  CO2 25   < > 22 22 26   GLUCOSE 122*   < > 94 81 97  BUN 9   < > 4* 6* 9  CREATININE 0.74   < > 0.74 0.71 0.56  CALCIUM 9.1   < > 9.7 9.8 9.3  GFRNONAA >60   < > 82 86 >60  GFRAA >60   < > 94 99 >60  PROT 8.0   < > 6.7 7.4 7.5  ALBUMIN 4.3   < > 3.8 4.3 3.2*  AST 33   < > 34 30 33  ALT 26   < > 21 14 13   ALKPHOS 81   < > 133* 94 109  BILITOT 0.6   < > 0.4 0.5 0.5  BILIDIR 0.2  --   --   --   --   IBILI 0.4  --   --   --   --    < > = values  in this interval not displayed.   Iron/TIBC/Ferritin/ %Sat No results found for: IRON, TIBC, FERRITIN, IRONPCTSAT   RADIOGRAPHIC STUDIES: I have personally reviewed the radiological images as listed and agreed with the findings in the report. . 10/20/2018 CT chest w contrast . Large mediastinal mass involving both hila left much greater than right consistent with lung carcinoma possibly small cell lung carcinoma. This mass causes narrowing of the left mainstem bronchus with resultant volume loss on the left and mediastinal shift to the Left. 2. Moderate size left pleural effusion. 3. Coronary artery calcifications and moderate thoracic aortic atherosclerosis.  PET 10/26/2018 . Large left lung mass is markedly hypermetabolic in the confluent lymphadenopathy in the mediastinum and in bilateral hilar regions also shows marked hypermetabolism. 2. Hypermetabolic metastatic lymphadenopathy in the right neck and supraclavicular region. 3. Hypermetabolic lymphadenopathy in the hepato duodenal ligament consistent with metastatic disease. 4. Large hypermetabolic lesion posterior right acetabulum without correlate of CT finding. Features highly suspicious for bony metastatic disease.  MRI brain 10/28/2018 1. No metastatic disease or acute intracranial abnormality identified. 2. 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  3. Moderately advanced but nonspecific  cerebral white matter signal changes, most commonly due to chronic small vessel disease. Lesser signal changes in the deep gray matter and pons may also be small vessel related.  ASSESSMENT & PLAN:  1. Small cell lung cancer ([place])   2. Encounter for antineoplastic chemotherapy   3. Goals of care, counseling/discussion   4. Neoplasm related pain   5. Dysphagia, unspecified type   6. Hyponatremia    #Extensive small cell lung cancer MRI of brain was independently reviewed by me and discussed with patient.  No intracranial  metastasis. Patient has had chemo class and has underwent Mediport placement.  Tolerated well. Discussed with patient again about the rationale of using palliative chemotherapy/immunotherapy and potential side effects. Labs reviewed and discussed with patient.  Counts acceptable to proceed with cycle 1 carboplatin and etoposide.  Patient understands that her disease is not curable and chemotherapy is with palliative intent. Discussed with radiation oncology Dr. Baruch Gouty #Compression symptoms from lung cancer/dysphagia Plans to monitor patient's for 1-2 cycles of treatment If continues to have compression symptoms/dysphagia, Dr. Baruch Gouty will proceed with palliative radiation. Hold Tecentriq for the first cycle of treatment given that she may need radiation in the near future.  #Patient informs me that she is going to [place] for family reunion next week.  I had a lengthy discussion with patient and her husband.  Ideally I want to see patient 1 week post first cycle of chemotherapy treatment for assessment of tolerability of chemotherapy.  Patient feels that since she has an incurable disease and do not have too much time left, she prefers to take this opportunity and admit her family members in [place].  She understands the risk of not being able to follow-up with me in time. I advised patient to see primary care physician/urgent care in [place] and obtain CBC CMP weekly.  It is patient's responsibility to ask lab to fax results to me. Patient also understands that if she spikes fever she should go to local emergency room for evaluation.  She will receive G-CSF report with Udenyca on 11/07/2018.Growth factor-Udenyca would be given as prophylaxis for chemotherapy-induced neutropenia to prevent febrile neutropenias. Discussed potential side effect- myalgias/arthralgias- recommend Claritin for 4 days.  I also gave her a small supply of tramadol in case she has excruciating bone pain after Udenyca injection.   Patient verbalized understanding that medications should not be sold or shared, taken with alcohol, or used while driving. Patient educated that medications should not be bitten, chewed, or crushed. patient has been made aware of the sside effects and treatment/ prevention  of constipation,  respiratory depression,  and mental status changes, even lead to overdose that may result in death, if used outside of the parameters that we discussed.   #Hypokalemia, advised patient to take oral potassium chloride 20 mEq daily.  Weekly CMP. 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  Stable. 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  Continue monitor.  Weekly CMP.  #chronic respiratory failure/emphasema/ hypoxia  ambulatory oxygen pulse ox was obtained.  Patient oxygen saturation dropped to 77% with exertion.  Qualify for oxygen at home.  #Pleural effusion, moderate.  Consider therapeutic thoracentesis if respiratory status worsen.  Patient suffers from small cell lung cancer, pleural effusion, emphysema, respiratory failure with oxygen dependance,which impair her ability to perform daily activities like walking, dressing and bathing in the home. A cane, walker or crutch will not resolve issue with performing activities of daily living. A wheelchair will allow  patient to safely perform daily activities.  Accessories: elevating leg rests (ELRs), wheel locks, extensions and anti-tippers  We spent sufficient time to discuss many aspect of care, questions were answered to patient's satisfaction. [facility] prescription of CBC and CMP weekly provided to patient.  She will bring to [place] and get labs done locally and fax over to me.  The patient knows to call the clinic with any problems questions or concerns.  Return of visit: 3 weeks for assessment prior to second cycle of chemotherapy treatment.   Earlie Server, MD, PhD Hematology Oncology [facility] at [facility] Pager- [phone] 11/02/2018

## 2018-11-02 NOTE — Progress Notes (Signed)
Per Almyra Free CMA per Dr. Tasia Catchings okay to proceed with Etoposide and Carboplatin treatment with Sodium 132 and Potassium 3.3. Pt tolerated infusion well. Pt stable at discharge.

## 2018-11-02 NOTE — Telephone Encounter (Signed)
   Ref Range & Units 08:10  Potassium 3.5 - 5.1 mmol/L 3.3Low

## 2018-11-03 ENCOUNTER — Other Ambulatory Visit: Payer: Self-pay | Admitting: *Deleted

## 2018-11-03 ENCOUNTER — Inpatient Hospital Stay: Payer: Medicare Other

## 2018-11-03 VITALS — BP 125/83 | HR 80 | Temp 97.0°F | Resp 20 | Wt 145.5 lb

## 2018-11-03 DIAGNOSIS — Z5111 Encounter for antineoplastic chemotherapy: Secondary | ICD-10-CM | POA: Diagnosis not present

## 2018-11-03 DIAGNOSIS — J441 Chronic obstructive pulmonary disease with (acute) exacerbation: Secondary | ICD-10-CM

## 2018-11-03 DIAGNOSIS — C3490 Malignant neoplasm of unspecified part of unspecified bronchus or lung: Secondary | ICD-10-CM

## 2018-11-03 DIAGNOSIS — C3402 Malignant neoplasm of left main bronchus: Secondary | ICD-10-CM | POA: Diagnosis not present

## 2018-11-03 DIAGNOSIS — J90 Pleural effusion, not elsewhere classified: Secondary | ICD-10-CM | POA: Diagnosis not present

## 2018-11-03 DIAGNOSIS — J439 Emphysema, unspecified: Secondary | ICD-10-CM | POA: Diagnosis not present

## 2018-11-03 DIAGNOSIS — J9610 Chronic respiratory failure, unspecified whether with hypoxia or hypercapnia: Secondary | ICD-10-CM | POA: Diagnosis not present

## 2018-11-03 DIAGNOSIS — D649 Anemia, unspecified: Secondary | ICD-10-CM | POA: Diagnosis not present

## 2018-11-03 MED ORDER — SODIUM CHLORIDE 0.9 % IV SOLN
Freq: Once | INTRAVENOUS | Status: AC
  Administered 2018-11-03: 13:00:00 via INTRAVENOUS
  Filled 2018-11-03: qty 250

## 2018-11-03 MED ORDER — SODIUM CHLORIDE 0.9 % IV SOLN: 10.0000 mg | Freq: Once | INTRAVENOUS | Status: DC

## 2018-11-03 MED ORDER — DEXAMETHASONE SODIUM PHOSPHATE 10 MG/ML IJ SOLN
10.0000 mg | Freq: Once | INTRAMUSCULAR | Status: AC
  Administered 2018-11-03: 10 mg via INTRAVENOUS
  Filled 2018-11-03: qty 1

## 2018-11-03 MED ORDER — HEPARIN SOD (PORK) LOCK FLUSH 100 UNIT/ML IV SOLN
500.0000 [IU] | Freq: Once | INTRAVENOUS | Status: AC | PRN
  Administered 2018-11-03: 500 [IU]
  Filled 2018-11-03: qty 5

## 2018-11-03 MED ORDER — SODIUM CHLORIDE 0.9 % IV SOLN
100.0000 mg/m2 | Freq: Once | INTRAVENOUS | Status: AC
  Administered 2018-11-03: 180 mg via INTRAVENOUS
  Filled 2018-11-03: qty 9

## 2018-11-04 ENCOUNTER — Other Ambulatory Visit: Payer: Self-pay | Admitting: *Deleted

## 2018-11-04 ENCOUNTER — Other Ambulatory Visit: Payer: Self-pay | Admitting: Oncology

## 2018-11-04 ENCOUNTER — Encounter: Payer: Self-pay | Admitting: *Deleted

## 2018-11-04 ENCOUNTER — Inpatient Hospital Stay: Payer: Medicare Other

## 2018-11-04 VITALS — BP 116/69 | HR 75 | Temp 96.8°F | Resp 20

## 2018-11-04 DIAGNOSIS — D649 Anemia, unspecified: Secondary | ICD-10-CM | POA: Diagnosis not present

## 2018-11-04 DIAGNOSIS — J439 Emphysema, unspecified: Secondary | ICD-10-CM | POA: Diagnosis not present

## 2018-11-04 DIAGNOSIS — C3402 Malignant neoplasm of left main bronchus: Secondary | ICD-10-CM | POA: Diagnosis not present

## 2018-11-04 DIAGNOSIS — J9610 Chronic respiratory failure, unspecified whether with hypoxia or hypercapnia: Secondary | ICD-10-CM | POA: Diagnosis not present

## 2018-11-04 DIAGNOSIS — J90 Pleural effusion, not elsewhere classified: Secondary | ICD-10-CM | POA: Diagnosis not present

## 2018-11-04 DIAGNOSIS — J441 Chronic obstructive pulmonary disease with (acute) exacerbation: Secondary | ICD-10-CM

## 2018-11-04 DIAGNOSIS — C3490 Malignant neoplasm of unspecified part of unspecified bronchus or lung: Secondary | ICD-10-CM

## 2018-11-04 DIAGNOSIS — Z5111 Encounter for antineoplastic chemotherapy: Secondary | ICD-10-CM | POA: Diagnosis not present

## 2018-11-04 MED ORDER — HEPARIN SOD (PORK) LOCK FLUSH 100 UNIT/ML IV SOLN
500.0000 [IU] | Freq: Once | INTRAVENOUS | Status: AC | PRN
  Administered 2018-11-04: 500 [IU]
  Filled 2018-11-04: qty 5

## 2018-11-04 MED ORDER — OMEPRAZOLE 40 MG PO CPDR: 40.0000 mg | DELAYED_RELEASE_CAPSULE | Freq: Every day | ORAL | 1 refills | Status: DC

## 2018-11-04 MED ORDER — SODIUM CHLORIDE 0.9% FLUSH
10.0000 mL | INTRAVENOUS | Status: DC | PRN
  Administered 2018-11-04: 10 mL
  Filled 2018-11-04: qty 10

## 2018-11-04 MED ORDER — SODIUM CHLORIDE 0.9 % IV SOLN: 10.0000 mg | Freq: Once | INTRAVENOUS | Status: DC

## 2018-11-04 MED ORDER — SODIUM CHLORIDE 0.9 % IV SOLN
Freq: Once | INTRAVENOUS | Status: AC
  Administered 2018-11-04: 14:00:00 via INTRAVENOUS
  Filled 2018-11-04: qty 250

## 2018-11-04 MED ORDER — SUCRALFATE 1 G PO TABS: 1.0000 g | ORAL_TABLET | Freq: Three times a day (TID) | ORAL | 1 refills | Status: DC

## 2018-11-04 MED ORDER — SODIUM CHLORIDE 0.9 % IV SOLN
100.0000 mg/m2 | Freq: Once | INTRAVENOUS | Status: AC
  Administered 2018-11-04: 180 mg via INTRAVENOUS
  Filled 2018-11-04: qty 9

## 2018-11-04 MED ORDER — DEXAMETHASONE SODIUM PHOSPHATE 10 MG/ML IJ SOLN
10.0000 mg | Freq: Once | INTRAMUSCULAR | Status: AC
  Administered 2018-11-04: 10 mg via INTRAVENOUS
  Filled 2018-11-04: qty 1

## 2018-11-04 NOTE — Progress Notes (Signed)
  Oncology Nurse Navigator Documentation  Navigator Location: [facility] (11/04/18 1400)   )Navigator Encounter Type: Treatment (11/04/18 1400)                   Treatment Initiated Date: 11/02/18 (11/04/18 1400)   Treatment Phase: Treatment (11/04/18 1400) Barriers/Navigation Needs: No barriers at this time (11/04/18 1400)   Interventions: None required (11/04/18 1400)                      Time Spent with Patient: 30 (11/04/18 1400)

## 2018-11-07 ENCOUNTER — Other Ambulatory Visit: Payer: Medicare Other

## 2018-11-07 ENCOUNTER — Inpatient Hospital Stay: Payer: Medicare Other

## 2018-11-07 ENCOUNTER — Ambulatory Visit: Payer: Medicare Other | Admitting: Oncology

## 2018-11-07 ENCOUNTER — Ambulatory Visit: Payer: Medicare Other

## 2018-11-07 DIAGNOSIS — J90 Pleural effusion, not elsewhere classified: Secondary | ICD-10-CM | POA: Diagnosis not present

## 2018-11-07 DIAGNOSIS — Z5111 Encounter for antineoplastic chemotherapy: Secondary | ICD-10-CM | POA: Diagnosis not present

## 2018-11-07 DIAGNOSIS — C3402 Malignant neoplasm of left main bronchus: Secondary | ICD-10-CM | POA: Diagnosis not present

## 2018-11-07 DIAGNOSIS — J439 Emphysema, unspecified: Secondary | ICD-10-CM | POA: Diagnosis not present

## 2018-11-07 DIAGNOSIS — J9610 Chronic respiratory failure, unspecified whether with hypoxia or hypercapnia: Secondary | ICD-10-CM | POA: Diagnosis not present

## 2018-11-07 DIAGNOSIS — D649 Anemia, unspecified: Secondary | ICD-10-CM | POA: Diagnosis not present

## 2018-11-07 DIAGNOSIS — C3490 Malignant neoplasm of unspecified part of unspecified bronchus or lung: Secondary | ICD-10-CM

## 2018-11-07 MED ORDER — PEGFILGRASTIM-CBQV 6 MG/0.6ML ~~LOC~~ SOSY
6.0000 mg | PREFILLED_SYRINGE | Freq: Once | SUBCUTANEOUS | Status: AC
  Administered 2018-11-07: 6 mg via SUBCUTANEOUS

## 2018-11-08 ENCOUNTER — Ambulatory Visit: Payer: Medicare Other

## 2018-11-09 ENCOUNTER — Ambulatory Visit: Payer: Medicare Other

## 2018-11-09 ENCOUNTER — Other Ambulatory Visit: Payer: Self-pay | Admitting: *Deleted

## 2018-11-09 NOTE — Telephone Encounter (Deleted)
Pt experiencing bone pain after receiving udenyca injection on Monday. Pt is in [place] visiting family and would like refill of hydrocodone be called into pharmacy near where she is staying.

## 2018-11-09 NOTE — Telephone Encounter (Signed)
Pt is currently visiting family in [place] and forgot prescription for hydrocodone at home. Pt is experiencing bone pain from udenyca injection. Pt requests if refill of hydrocodone be sent into pharmacy to help relieve pain. Per Dr. Tasia Catchings, pt needs to take claritin and extra strength tylenol at this time. Advises that if pain gets worse then can go to urgent care or other provider to get pain meds prescribed since pt is out of state. Spoke with pt's husband and gave MD recommendations. Pt's husband verbalized understanding.

## 2018-11-10 DIAGNOSIS — C3490 Malignant neoplasm of unspecified part of unspecified bronchus or lung: Secondary | ICD-10-CM | POA: Diagnosis not present

## 2018-11-11 ENCOUNTER — Other Ambulatory Visit: Payer: Self-pay | Admitting: Oncology

## 2018-11-11 ENCOUNTER — Other Ambulatory Visit: Payer: Self-pay | Admitting: *Deleted

## 2018-11-11 ENCOUNTER — Ambulatory Visit: Payer: Medicare Other

## 2018-11-11 MED ORDER — TRAMADOL HCL 50 MG PO TABS: 50.0000 mg | ORAL_TABLET | Freq: Four times a day (QID) | ORAL | 0 refills | Status: DC | PRN

## 2018-11-11 NOTE — Telephone Encounter (Signed)
Entered in error

## 2018-11-14 ENCOUNTER — Other Ambulatory Visit: Payer: Self-pay | Admitting: *Deleted

## 2018-11-14 ENCOUNTER — Encounter: Payer: Self-pay | Admitting: Oncology

## 2018-11-14 ENCOUNTER — Encounter: Payer: Self-pay | Admitting: Surgery

## 2018-11-14 DIAGNOSIS — C3490 Malignant neoplasm of unspecified part of unspecified bronchus or lung: Secondary | ICD-10-CM

## 2018-11-14 LAB — CULTURE, FUNGUS WITHOUT SMEAR: Special Requests: NORMAL

## 2018-11-14 MED ORDER — HYDROCODONE-ACETAMINOPHEN 5-325 MG PO TABS: 1.0000 | ORAL_TABLET | Freq: Four times a day (QID) | ORAL | 0 refills | Status: DC | PRN

## 2018-11-16 DIAGNOSIS — C3490 Malignant neoplasm of unspecified part of unspecified bronchus or lung: Secondary | ICD-10-CM | POA: Diagnosis not present

## 2018-11-20 HISTORY — PX: CHOLECYSTECTOMY: SHX55

## 2018-11-23 ENCOUNTER — Other Ambulatory Visit: Payer: Self-pay

## 2018-11-23 ENCOUNTER — Inpatient Hospital Stay (HOSPITAL_BASED_OUTPATIENT_CLINIC_OR_DEPARTMENT_OTHER): Payer: Medicare Other | Admitting: Oncology

## 2018-11-23 ENCOUNTER — Inpatient Hospital Stay: Payer: Medicare Other | Attending: Oncology

## 2018-11-23 ENCOUNTER — Inpatient Hospital Stay: Payer: Medicare Other

## 2018-11-23 ENCOUNTER — Ambulatory Visit: Payer: Medicare Other

## 2018-11-23 ENCOUNTER — Encounter: Payer: Self-pay | Admitting: Oncology

## 2018-11-23 VITALS — BP 104/79 | HR 89 | Temp 96.1°F | Resp 18 | Wt 138.7 lb

## 2018-11-23 DIAGNOSIS — R1310 Dysphagia, unspecified: Secondary | ICD-10-CM

## 2018-11-23 DIAGNOSIS — R630 Anorexia: Secondary | ICD-10-CM | POA: Diagnosis not present

## 2018-11-23 DIAGNOSIS — Z66 Do not resuscitate: Secondary | ICD-10-CM | POA: Insufficient documentation

## 2018-11-23 DIAGNOSIS — J9610 Chronic respiratory failure, unspecified whether with hypoxia or hypercapnia: Secondary | ICD-10-CM

## 2018-11-23 DIAGNOSIS — Z5112 Encounter for antineoplastic immunotherapy: Secondary | ICD-10-CM

## 2018-11-23 DIAGNOSIS — I252 Old myocardial infarction: Secondary | ICD-10-CM | POA: Diagnosis not present

## 2018-11-23 DIAGNOSIS — R5383 Other fatigue: Secondary | ICD-10-CM

## 2018-11-23 DIAGNOSIS — C3402 Malignant neoplasm of left main bronchus: Secondary | ICD-10-CM | POA: Insufficient documentation

## 2018-11-23 DIAGNOSIS — Z5111 Encounter for antineoplastic chemotherapy: Secondary | ICD-10-CM | POA: Diagnosis not present

## 2018-11-23 DIAGNOSIS — I2510 Atherosclerotic heart disease of native coronary artery without angina pectoris: Secondary | ICD-10-CM | POA: Diagnosis not present

## 2018-11-23 DIAGNOSIS — Z5189 Encounter for other specified aftercare: Secondary | ICD-10-CM | POA: Insufficient documentation

## 2018-11-23 DIAGNOSIS — C3490 Malignant neoplasm of unspecified part of unspecified bronchus or lung: Secondary | ICD-10-CM

## 2018-11-23 DIAGNOSIS — M898X9 Other specified disorders of bone, unspecified site: Secondary | ICD-10-CM

## 2018-11-23 DIAGNOSIS — F329 Major depressive disorder, single episode, unspecified: Secondary | ICD-10-CM | POA: Diagnosis not present

## 2018-11-23 DIAGNOSIS — R634 Abnormal weight loss: Secondary | ICD-10-CM | POA: Diagnosis not present

## 2018-11-23 DIAGNOSIS — Z87891 Personal history of nicotine dependence: Secondary | ICD-10-CM | POA: Insufficient documentation

## 2018-11-23 DIAGNOSIS — E871 Hypo-osmolality and hyponatremia: Secondary | ICD-10-CM

## 2018-11-23 DIAGNOSIS — Z79899 Other long term (current) drug therapy: Secondary | ICD-10-CM | POA: Diagnosis not present

## 2018-11-23 LAB — CBC WITH DIFFERENTIAL/PLATELET
Abs Immature Granulocytes: 0.71 10*3/uL — ABNORMAL HIGH (ref 0.00–0.07)
BASOS PCT: 1 %
Basophils Absolute: 0.1 10*3/uL (ref 0.0–0.1)
Eosinophils Absolute: 0 10*3/uL (ref 0.0–0.5)
Eosinophils Relative: 0 %
HCT: 29.9 % — ABNORMAL LOW (ref 36.0–46.0)
Hemoglobin: 10.1 g/dL — ABNORMAL LOW (ref 12.0–15.0)
Immature Granulocytes: 5 %
Lymphocytes Relative: 13 %
Lymphs Abs: 1.8 10*3/uL (ref 0.7–4.0)
MCH: 29.1 pg (ref 26.0–34.0)
MCHC: 33.8 g/dL (ref 30.0–36.0)
MCV: 86.2 fL (ref 80.0–100.0)
MONOS PCT: 11 %
Monocytes Absolute: 1.5 10*3/uL — ABNORMAL HIGH (ref 0.1–1.0)
Neutro Abs: 9.2 10*3/uL — ABNORMAL HIGH (ref 1.7–7.7)
Neutrophils Relative %: 70 %
PLATELETS: 315 10*3/uL (ref 150–400)
RBC: 3.47 MIL/uL — ABNORMAL LOW (ref 3.87–5.11)
RDW: 13.9 % (ref 11.5–15.5)
WBC: 13.3 10*3/uL — ABNORMAL HIGH (ref 4.0–10.5)
nRBC: 0 % (ref 0.0–0.2)

## 2018-11-23 LAB — COMPREHENSIVE METABOLIC PANEL
ALT: 10 U/L (ref 0–44)
AST: 15 U/L (ref 15–41)
Albumin: 3.3 g/dL — ABNORMAL LOW (ref 3.5–5.0)
Alkaline Phosphatase: 114 U/L (ref 38–126)
Anion gap: 10 (ref 5–15)
BUN: 15 mg/dL (ref 8–23)
CO2: 24 mmol/L (ref 22–32)
Calcium: 9.2 mg/dL (ref 8.9–10.3)
Chloride: 102 mmol/L (ref 98–111)
Creatinine, Ser: 0.64 mg/dL (ref 0.44–1.00)
GFR calc Af Amer: >60 mL/min (ref >60)
GFR calc non Af Amer: >60 mL/min (ref >60)
Glucose, Bld: 93 mg/dL (ref 70–99)
Potassium: 3.8 mmol/L (ref 3.5–5.1)
Sodium: 136 mmol/L (ref 135–145)
Total Bilirubin: 0.5 mg/dL (ref 0.3–1.2)
Total Protein: 7.2 g/dL (ref 6.5–8.1)

## 2018-11-23 LAB — TSH: TSH: 1.477 u[IU]/mL (ref 0.350–4.500)

## 2018-11-23 MED ORDER — SODIUM CHLORIDE 0.9 % IV SOLN
1200.0000 mg | Freq: Once | INTRAVENOUS | Status: AC
  Administered 2018-11-23: 1200 mg via INTRAVENOUS
  Filled 2018-11-23: qty 20

## 2018-11-23 MED ORDER — HEPARIN SOD (PORK) LOCK FLUSH 100 UNIT/ML IV SOLN
INTRAVENOUS | Status: AC
  Filled 2018-11-23: qty 5

## 2018-11-23 MED ORDER — DRONABINOL 5 MG PO CAPS: 5.0000 mg | ORAL_CAPSULE | Freq: Two times a day (BID) | ORAL | 0 refills | Status: DC

## 2018-11-23 MED ORDER — HEPARIN SOD (PORK) LOCK FLUSH 100 UNIT/ML IV SOLN
500.0000 [IU] | Freq: Once | INTRAVENOUS | Status: AC | PRN
  Administered 2018-11-23: 500 [IU]

## 2018-11-23 MED ORDER — SODIUM CHLORIDE 0.9 % IV SOLN
Freq: Once | INTRAVENOUS | Status: AC
  Administered 2018-11-23: 10:00:00 via INTRAVENOUS
  Filled 2018-11-23: qty 5

## 2018-11-23 MED ORDER — SODIUM CHLORIDE 0.9 % IV SOLN
100.0000 mg/m2 | Freq: Once | INTRAVENOUS | Status: AC
  Administered 2018-11-23: 180 mg via INTRAVENOUS
  Filled 2018-11-23: qty 9

## 2018-11-23 MED ORDER — HYDROCODONE-ACETAMINOPHEN 5-325 MG PO TABS: 1.0000 | ORAL_TABLET | Freq: Four times a day (QID) | ORAL | 0 refills | Status: DC | PRN

## 2018-11-23 MED ORDER — SODIUM CHLORIDE 0.9 % IV SOLN
Freq: Once | INTRAVENOUS | Status: AC
  Administered 2018-11-23: 10:00:00 via INTRAVENOUS
  Filled 2018-11-23: qty 250

## 2018-11-23 MED ORDER — SODIUM CHLORIDE 0.9 % IV SOLN
390.0000 mg | Freq: Once | INTRAVENOUS | Status: AC
  Administered 2018-11-23: 390 mg via INTRAVENOUS
  Filled 2018-11-23: qty 39

## 2018-11-23 MED ORDER — PALONOSETRON HCL INJECTION 0.25 MG/5ML
0.2500 mg | Freq: Once | INTRAVENOUS | Status: AC
  Administered 2018-11-23: 0.25 mg via INTRAVENOUS
  Filled 2018-11-23: qty 5

## 2018-11-23 NOTE — Progress Notes (Signed)
Patient here for follow up. Pt complains of burning to joints.

## 2018-11-23 NOTE — Progress Notes (Signed)
Hematology/Oncology Consult note [facility] Telephone:([phone] [phone]   Patient Care Team: Glean Hess, MD as PCP - General (Internal Medicine) Charolette Forward, MD as Consulting Physician (Cardiology) Telford Nab, RN as Registered Nurse  REFERRING PROVIDER: Dr. 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 Patient recently was referred to see pulmonology Dr. Felicie Morn for persistent cough and dyspnea.  Patient reports coughing up clear sputum for about 2 months.  No hemoptysis.  She also had weight loss. Former smoker, quitting smoking 2 weeks ago.  39-pack-year smoking history  09/30/2018 chest x-ray showed new market volume loss in the left chest area of the left mainstem bronchus worrisome for centrally obstructing mass. 10/17/2018 chest x-ray showed volume loss worsened in the left hemithorax worsening patchy consolidation in the mid to lower left lung with minimal residual aeration and up in the left lung mass not excluded.  Possible small left pleural effusion. 10/31 2019 CT chest with contrast showed large mediastinal mass involving both hilar, left greater than right, consistent with lung carcinoma The mass causes narrowing of the left mainstem bronchus with resultant volume loss on the left and a mediastinal shift to the left.  Moderate size left pleural effusion. 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  Today patient was accompanied by husband and daughter to clinic to discuss diagnosis and management plan. She reports feeling tired feels shortness of breath with exertion.  Persistent coughing, productive with clear sputum. Also reports difficulty  swallowing, feels food sticking in her food pipe. MRI brain was obtained which was negative for intracranial metastatic disease.  INTERVAL HISTORY Kristi [name] is a 72 y.o. female who has above history reviewed by me today presents for follow up visit for management of extensive small cell lung cancer.   Overall reports tolerating first cycle of chemotherapy with carbo etoposide and Tecentriq. #She has had bone pain secondary to G-CSF.  Takes tramadol which is not helpful.  Currently on Norco every 6 hours as needed.   Manageable nausea, no vomiting. She has came back from family reunion in [place]. Shortness of breath and a cough has improved. Dysphagia is better. Feels tired  Review of Systems  Constitutional: Positive for fatigue. Negative for appetite change, chills and fever.  HENT:   Negative for hearing loss and voice change.   Eyes: Negative for eye problems.  Respiratory: Positive for cough and shortness of breath. Negative for chest tightness.   Cardiovascular: Negative for chest pain.  Gastrointestinal: Negative for abdominal distention, abdominal pain and blood in stool.  Endocrine: Negative for hot flashes.  Genitourinary: Negative for difficulty urinating and frequency.   Musculoskeletal: Negative for arthralgias.  Skin: Negative for itching and rash.  Neurological: Negative for extremity weakness.  Hematological: Negative for adenopathy.  Psychiatric/Behavioral: Negative for confusion.     MEDICAL HISTORY:  Past Medical History:  Diagnosis Date  . Acute MI, inferoposterior wall ([place]) 09/30/2014  . Claustrophobia   . Coronary artery disease   . Hypercholesteremia   . Hypertension   . MI, old   . 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 [facility]) 10/27/2018    SURGICAL HISTORY: Past Surgical History:  Procedure Laterality Date  . APPENDECTOMY     benign  tumor on liver found  . BLADDER NECK SUSPENSION    . CHOLECYSTECTOMY N/A 08/14/2018   Procedure: LAPAROSCOPIC  CHOLECYSTECTOMY;  Surgeon: Jules Husbands, MD;  Location: [facility] ORS;  Service: General;  Laterality: N/A;  . CORONARY STENT PLACEMENT    . ENDOBRONCHIAL ULTRASOUND N/A 10/21/2018   Procedure: ENDOBRONCHIAL ULTRASOUND;  Surgeon: Laverle Hobby, MD;  Location: [facility] ORS;  Service: Pulmonary;  Laterality: N/A;  . HERNIA REPAIR    . LEFT HEART CATHETERIZATION WITH CORONARY ANGIOGRAM N/A 09/29/2014   Procedure: LEFT HEART CATHETERIZATION WITH CORONARY ANGIOGRAM;  Surgeon: Clent Demark, MD;  Location: [facility] CATH LAB;  Service: Cardiovascular;  Laterality: N/A;  . PORTACATH PLACEMENT Right 10/28/2018   Procedure: INSERTION PORT-A-CATH;  Surgeon: Jules Husbands, MD;  Location: [facility] ORS;  Service: General;  Laterality: Right;    SOCIAL HISTORY: Social History   Socioeconomic History  . Marital status: Married    Spouse name: Not on file  . Number of children: Not on file  . Years of education: Not on file  . Highest education level: Not on file  Occupational History  . Not on file  Social Needs  . Financial resource strain: Not on file  . Food insecurity:    Worry: Not on file    Inability: Not on file  . Transportation needs:    Medical: Not on file    Non-medical: Not on file  Tobacco Use  . Smoking status: Former Smoker    Packs/day: 1.00    Years: 39.00    Pack years: 39.00    Types: Cigarettes    Start date: 12/21/1978    Last attempt to quit: 10/16/2018    Years since quitting: 0.1  . Smokeless tobacco: Never Used  Substance and Sexual Activity  . Alcohol use: Yes    Alcohol/week: 0.0 standard drinks    Comment: occassional - approx 1 every 2 weeks   . Drug use: No  . Sexual activity: Yes    Birth control/protection: None  Lifestyle  . Physical activity:    Days per week: Not on file    Minutes per session: Not on file  . Stress: Not on file  Relationships  . Social connections:    Talks on phone: Not on file    Gets together: Not on file    Attends religious  service: Not on file    Active member of club or organization: Not on file    Attends meetings of clubs or organizations: Not on file    Relationship status: Not on file  . Intimate partner violence:    Fear of current or ex partner: Not on file    Emotionally abused: Not on file    Physically abused: Not on file    Forced sexual activity: Not on file  Other Topics Concern  . Not on file  Social History Narrative  . Not on file    FAMILY HISTORY: Family History  Problem Relation Age of Onset  . Alzheimer's disease Mother   . Colon cancer Father   . Breast cancer Neg Hx     ALLERGIES:  has No Known Allergies.  MEDICATIONS:  Current Outpatient Medications  Medication Sig Dispense Refill  . albuterol (PROAIR HFA) 108 (90 Base) MCG/ACT inhaler Inhale 2 puffs into the lungs every 6 (six) hours as needed for wheezing or shortness of breath. 18 g 2  . ALPRAZolam (XANAX) 0.5 MG tablet Take medication with you to MRI. Take 1 tablet prior  to MRI. 5 tablet 0  . AMBULATORY NON FORMULARY MEDICATION Medication Name: Please provide patient with Nebulizer. DX: J44.9 1 each 0  . aspirin EC 81 MG tablet Take 81 mg by mouth daily.    [name] Kitchen atorvastatin (LIPITOR) 80 MG tablet Take 0.5 tablets (40 mg total) by mouth daily at 6 PM. (Patient taking differently: Take 80 mg by mouth at bedtime. ) 30 tablet 3  . B Complex Vitamins (VITAMIN B COMPLEX PO) Take 1 tablet by mouth daily.     . benzonatate (TESSALON PERLES) 100 MG capsule Take 2 capsules (200 mg total) by mouth 3 (three) times daily. 90 capsule 2  . CALCIUM PO Take 1 tablet by mouth daily.     . cetirizine (ZYRTEC) 10 MG tablet Take 10 mg by mouth daily.    . chlorhexidine (PERIDEX) 0.12 % solution Use as directed 15 mLs in the mouth or throat 2 (two) times daily. 473 mL 0  . Cholecalciferol (VITAMIN D) 2000 units CAPS Take 1 capsule (2,000 Units total) by mouth daily. 30 capsule   . dexamethasone (DECADRON) 4 MG tablet Take 8mg  daily for 2 days  after chemtherapy treatments. 30 tablet 1  . docusate sodium (COLACE) 100 MG capsule Take 100 mg by mouth daily.    . Fluticasone-Umeclidin-Vilant (TRELEGY ELLIPTA) 100-62.5-25 MCG/INH AEPB Inhale 1 puff into the lungs daily. 1 each 12  . HYDROcodone-acetaminophen (NORCO/VICODIN) 5-325 MG tablet Take 1 tablet by mouth every 6 (six) hours as needed for moderate pain. 30 tablet 0  . ipratropium-albuterol (DUONEB) 0.5-2.5 (3) MG/3ML SOLN Take 3 mLs by nebulization 3 (three) times daily. DX. J44.9 360 mL 1  . lidocaine-prilocaine (EMLA) cream Apply to affected area once 30 g 3  . metoprolol tartrate (LOPRESSOR) 25 MG tablet Take 12.5 mg by mouth 2 (two) times daily.    . nitroGLYCERIN (NITROSTAT) 0.4 MG SL tablet Place 1 tablet (0.4 mg total) under the tongue every 5 (five) minutes x 3 doses as needed for chest pain. 25 tablet 12  . omeprazole (PRILOSEC) 40 MG capsule TAKE 1 CAPSULE(40 MG) BY MOUTH DAILY 90 capsule 0  . ondansetron (ZOFRAN) 8 MG tablet Take 1 tablet (8 mg total) by mouth 2 (two) times daily as needed for refractory nausea / vomiting. Start on day 3 after carboplatin chemo. 30 tablet 1  . potassium chloride SA (K-DUR,KLOR-CON) 20 MEQ tablet TAKE 1 TABLET(20 MEQ) BY MOUTH DAILY 90 tablet 1  . prochlorperazine (COMPAZINE) 10 MG tablet TAKE 1 TABLET BY MOUTH EVERY 6 HOURS AS NEEDED FOR NAUSEA OR VOMITING 337 tablet 1  . sertraline (ZOLOFT) 25 MG tablet Take 1 tablet (25 mg total) by mouth daily. 30 tablet 2  . sucralfate (CARAFATE) 1 g tablet TAKE 1 TABLET BY MOUTH FOUR TIMES DAILY, WITH MEALS& AT BEDTIME 368 tablet 0  . dronabinol (MARINOL) 5 MG capsule Take 1 capsule (5 mg total) by mouth 2 (two) times daily before a meal. 60 capsule 0   No current facility-administered medications for this visit.    Facility-Administered Medications Ordered in Other Visits  Medication Dose Route Frequency Provider Last Rate Last Dose  . CARBOplatin (PARAPLATIN) 390 mg in sodium chloride 0.9 % 250 mL  chemo infusion  390 mg Intravenous Once Earlie Server, MD      . etoposide (VEPESID) 180 mg in sodium chloride 0.9 % 500 mL chemo infusion  100 mg/m2 (Treatment Plan Recorded) Intravenous Once Earlie Server, MD 509 mL/hr at 11/23/18 1151 180 mg at  11/23/18 1151  . heparin lock flush 100 unit/mL  500 Units Intracatheter Once PRN Earlie Server, MD         PHYSICAL EXAMINATION: ECOG PERFORMANCE STATUS: 1 - Symptomatic but completely ambulatory Vitals:   11/23/18 0908  BP: 104/79  Pulse: 89  Resp: 18  Temp: (!) 96.1 F (35.6 C)  SpO2: 98%   Filed Weights   11/23/18 0941  Weight: 138 lb 11.2 oz (62.9 kg)    Physical Exam  Constitutional: She is oriented to person, place, and time. No distress.  Thin  HENT:  Head: Normocephalic and atraumatic.  Mouth/Throat: Oropharynx is clear and moist.  Eyes: Pupils are equal, round, and reactive to light. EOM are normal. No scleral icterus.  Neck: Normal range of motion. Neck supple.  Cardiovascular: Normal rate, regular rhythm and normal heart sounds.  No murmur heard. Pulmonary/Chest: Effort normal. No respiratory distress. She has no wheezes.  Decreased breath sounds bilaterally  Abdominal: Soft. Bowel sounds are normal. She exhibits no distension and no mass. There is no tenderness.  Musculoskeletal: Normal range of motion. She exhibits no edema or deformity.  Neurological: She is alert and oriented to person, place, and time. No cranial nerve deficit. Coordination normal.  Skin: Skin is warm and dry. No rash noted. No erythema.  Psychiatric: She has a normal mood and affect. Her behavior is normal. Thought content normal.     LABORATORY DATA:  I have reviewed the data as listed Lab Results  Component Value Date   WBC 13.3 (H) 11/23/2018   HGB 10.1 (L) 11/23/2018   HCT 29.9 (L) 11/23/2018   MCV 86.2 11/23/2018   PLT 315 11/23/2018   Recent Labs    08/12/18 1438  09/19/18 1038 11/02/18 0810 11/23/18 0840  NA 131*   < > 136 132* 136  K 3.7    < > 3.8 3.3* 3.8  CL 97*   < > 94* 95* 102  CO2 25   < > 22 26 24   GLUCOSE 122*   < > 81 97 93  BUN 9   < > 6* 9 15  CREATININE 0.74   < > 0.71 0.56 0.64  CALCIUM 9.1   < > 9.8 9.3 9.2  GFRNONAA >60   < > 86 >60 >60  GFRAA >60   < > 99 >60 >60  PROT 8.0   < > 7.4 7.5 7.2  ALBUMIN 4.3   < > 4.3 3.2* 3.3*  AST 33   < > 30 33 15  ALT 26   < > 14 13 10   ALKPHOS 81   < > 94 109 114  BILITOT 0.6   < > 0.5 0.5 0.5  BILIDIR 0.2  --   --   --   --   IBILI 0.4  --   --   --   --    < > = values in this interval not displayed.   Iron/TIBC/Ferritin/ %Sat No results found for: IRON, TIBC, FERRITIN, IRONPCTSAT   RADIOGRAPHIC STUDIES: I have personally reviewed the radiological images as listed and agreed with the findings in the report. . 10/20/2018 CT chest w contrast . Large mediastinal mass involving both hila left much greater than right consistent with lung carcinoma possibly small cell lung carcinoma. This mass causes narrowing of the left mainstem bronchus with resultant volume loss on the left and mediastinal shift to the Left. 2. Moderate size left pleural effusion. 3. Coronary artery calcifications and moderate thoracic  aortic atherosclerosis.  PET 10/26/2018 . Large left lung mass is markedly hypermetabolic in the confluent lymphadenopathy in the mediastinum and in bilateral hilar regions also shows marked hypermetabolism. 2. Hypermetabolic metastatic lymphadenopathy in the right neck and supraclavicular region. 3. Hypermetabolic lymphadenopathy in the hepato duodenal ligament consistent with metastatic disease. 4. Large hypermetabolic lesion posterior right acetabulum without correlate of CT finding. Features highly suspicious for bony metastatic disease.  MRI brain 10/28/2018 1. No metastatic disease or acute intracranial abnormality identified. 2. 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  3. Moderately advanced but nonspecific cerebral white matter signal  changes, most commonly due to chronic small vessel disease. Lesser signal changes in the deep gray matter and pons may also be small vessel related.  ASSESSMENT & PLAN:  1. Small cell lung cancer ([place])   2. 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 Encounter for antineoplastic chemotherapy   4. Encounter for antineoplastic immunotherapy   5. Dysphagia, unspecified type   6. Other fatigue   7. Weight loss   8. Poor appetite    #Extensive small cell lung cancer Tolerated cycle 1 chemotherapy with G-CSF support Labs are reviewed and discussed with patient.  Counts acceptable to proceed with cycle 2 carbo, etoposide, Tecentriq. She will receive Udenyca on day 5 Advised patient to utilize Claritin for 4 days for bone pain Udenyca. Is due to chemotherapy and neoplasm. 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 Is well controlled. #Hyponatremia, sodium level has improved.  Normalized. #Chronic respiratory failure/emphysema/hypoxia, on home oxygen as needed #Plan repeat CT after 2 cycles.  #Poor appetite, suggest a trial of Marinol 5 mg twice daily. We spent sufficient time to discuss many aspect of care, questions were answered to patient's satisfaction. The patient knows to call the clinic with any problems questions or concerns.  Return of visit: 3 weeks for assessment prior to second cycle of chemotherapy treatment.   Earlie Server, MD, PhD Hematology Oncology [facility] Pager- [phone] 11/23/2018

## 2018-11-24 ENCOUNTER — Inpatient Hospital Stay (HOSPITAL_BASED_OUTPATIENT_CLINIC_OR_DEPARTMENT_OTHER): Payer: Medicare Other | Admitting: Hospice and Palliative Medicine

## 2018-11-24 ENCOUNTER — Encounter: Payer: Self-pay | Admitting: Hospice and Palliative Medicine

## 2018-11-24 ENCOUNTER — Inpatient Hospital Stay: Payer: Medicare Other

## 2018-11-24 VITALS — BP 101/70 | HR 93 | Temp 97.6°F | Resp 18 | Ht 65.0 in | Wt 142.3 lb

## 2018-11-24 VITALS — BP 110/71 | HR 92 | Temp 96.0°F | Resp 18

## 2018-11-24 DIAGNOSIS — Z66 Do not resuscitate: Secondary | ICD-10-CM

## 2018-11-24 DIAGNOSIS — F329 Major depressive disorder, single episode, unspecified: Secondary | ICD-10-CM

## 2018-11-24 DIAGNOSIS — R52 Pain, unspecified: Secondary | ICD-10-CM | POA: Diagnosis not present

## 2018-11-24 DIAGNOSIS — Z5111 Encounter for antineoplastic chemotherapy: Secondary | ICD-10-CM | POA: Diagnosis not present

## 2018-11-24 DIAGNOSIS — I2510 Atherosclerotic heart disease of native coronary artery without angina pectoris: Secondary | ICD-10-CM | POA: Diagnosis not present

## 2018-11-24 DIAGNOSIS — Z5112 Encounter for antineoplastic immunotherapy: Secondary | ICD-10-CM | POA: Diagnosis not present

## 2018-11-24 DIAGNOSIS — F419 Anxiety disorder, unspecified: Secondary | ICD-10-CM

## 2018-11-24 DIAGNOSIS — I252 Old myocardial infarction: Secondary | ICD-10-CM

## 2018-11-24 DIAGNOSIS — C3402 Malignant neoplasm of left main bronchus: Secondary | ICD-10-CM

## 2018-11-24 DIAGNOSIS — G893 Neoplasm related pain (acute) (chronic): Secondary | ICD-10-CM

## 2018-11-24 DIAGNOSIS — I10 Essential (primary) hypertension: Secondary | ICD-10-CM

## 2018-11-24 DIAGNOSIS — C3490 Malignant neoplasm of unspecified part of unspecified bronchus or lung: Secondary | ICD-10-CM

## 2018-11-24 DIAGNOSIS — Z515 Encounter for palliative care: Secondary | ICD-10-CM | POA: Diagnosis not present

## 2018-11-24 DIAGNOSIS — F32A Depression, unspecified: Secondary | ICD-10-CM

## 2018-11-24 DIAGNOSIS — M898X9 Other specified disorders of bone, unspecified site: Secondary | ICD-10-CM | POA: Diagnosis not present

## 2018-11-24 MED ORDER — DEXAMETHASONE SODIUM PHOSPHATE 10 MG/ML IJ SOLN
10.0000 mg | Freq: Once | INTRAMUSCULAR | Status: AC
  Administered 2018-11-24: 10 mg via INTRAVENOUS
  Filled 2018-11-24: qty 1

## 2018-11-24 MED ORDER — HEPARIN SOD (PORK) LOCK FLUSH 100 UNIT/ML IV SOLN
500.0000 [IU] | Freq: Once | INTRAVENOUS | Status: AC | PRN
  Administered 2018-11-24: 500 [IU]
  Filled 2018-11-24: qty 5

## 2018-11-24 MED ORDER — SERTRALINE HCL 50 MG PO TABS: 50.0000 mg | ORAL_TABLET | Freq: Every day | ORAL | 1 refills | Status: DC

## 2018-11-24 MED ORDER — SODIUM CHLORIDE 0.9% FLUSH
10.0000 mL | INTRAVENOUS | Status: DC | PRN
  Administered 2018-11-24: 10 mL
  Filled 2018-11-24: qty 10

## 2018-11-24 MED ORDER — SODIUM CHLORIDE 0.9 % IV SOLN
100.0000 mg/m2 | Freq: Once | INTRAVENOUS | Status: AC
  Administered 2018-11-24: 180 mg via INTRAVENOUS
  Filled 2018-11-24: qty 9

## 2018-11-24 MED ORDER — SODIUM CHLORIDE 0.9 % IV SOLN
Freq: Once | INTRAVENOUS | Status: AC
  Administered 2018-11-24: 12:00:00 via INTRAVENOUS
  Filled 2018-11-24: qty 250

## 2018-11-24 MED ORDER — SODIUM CHLORIDE 0.9 % IV SOLN: 10.0000 mg | Freq: Once | INTRAVENOUS | Status: DC

## 2018-11-24 NOTE — Progress Notes (Signed)
Nutrition Assessment   Reason for Assessment:   Referral from Dr. Tasia Catchings for weight loss, dysphagia.   ASSESSMENT:  72 year old female with new diagnosis of small cell lung cancer.  Past medical history of former smoker, MI, CAD, HTN, HLD.  Patient being followed by palliative care, NP.  Patient receiving chemotherapy treatment.  Has met with radiation oncology and could receive palliative radiation in the future to improve dysphagia symptoms if needed.  Met with patient and husband in clinic.  Patient reports poor appetite and confused about what foods to eat.  Noted patient has been seen by SLP.  Reports improved symptoms.  Reports that typically she eats 2 meals per day.  Recently only able to eat 1/4 of sandwich.  Did eat most of chinese food meal (fried rice and egg foo young) last night for dinner.  Patient often goes off in tangents during conversation, hard to keep on track.  Patient has tried ensure/boost and does not really like the taste.  Has been drinking premier protein shakes at home.    Nutrition Focused Physical Exam: deferred   Medications: xanax, b complex, colace, KCL, compazine, calcium and vit d, decadron, carafate, marinol   Labs: reviewed   Anthropometrics:   Height: 65 inches Weight: 138 lb 11.2 oz on 12/4 Noted in 08/17/18 documented wt of 173 lb.  Patient not clear on UBW BMI: 23  20% weight loss in the last  3 months, significant   Estimated Energy Needs  Kcals: 1800-2200 calories/d Protein: 90-110 Fluid: >1.8 L/d   NUTRITION DIAGNOSIS: Inadequate oral intake related to cancer, dysphagia as evidenced by 20% weight loss in the last 3 months, poor appetite and food and nutrition related knowledge deficit regarding foods to eat during treatment.    MALNUTRITION DIAGNOSIS: likely but will continue to monitor   INTERVENTION:  Discussed foods high in calories and protein.  Fact sheet given.   Discussed foods safe for her to eat with treatment.    Discussed oral nutrition supplements and samples given today to try along with coupons Contact information given   MONITORING, EVALUATION, GOAL: weight trends, intake   Next Visit:  Monday, December 30 during infusion  Carolyne Whitsel B. Zenia Resides, [place], [place] Registered Dietitian [phone] (pager)

## 2018-11-24 NOTE — Progress Notes (Signed)
[place]  [phone] [phone]   Name: Kristi [name] [facility] Date: 11/24/2018 MRN: [id number]  DOB: 06-07-1946  Patient Care Team: Glean Hess, MD as PCP - General (Internal Medicine) Charolette Forward, MD as Consulting Physician (Cardiology) Telford Nab, RN as Registered Nurse    REASON FOR CONSULTATION: Palliative Care consult requested for this 72 y.o. female with multiple medical problems including stage IV small cell lung cancer.  PMH also notable for CAD with history of acute MI, claustrophobia, hypertension, and hyperlipidemia.  Patient was recently seen and examined by pulmonology due to worsening exertional dyspnea.  She was found to have a central obstructing mass and mediastinal adenopathy on CT.  Patient is status post bronchoscopy with biopsy confirming small cell lung cancer.  PET CT scan revealed hypermetabolic areas involving lymphadenopathy in the neck and supraclavicular region, hepatoduodenal ligament, and disease of the right acetabulum.  Patient is pending initiation of chemotherapy with Atezolizumab, Carboplatin, and Etoposide.  Additionally she is being considered for palliative XRT of the chest mass to help with dysphagia.  Palliative care was asked to help address goals and symptoms.   SOCIAL HISTORY:    Patient is married and lives at home with her husband of 35 years.  They moved here from [place], [place] around 4 years ago.  Patient has 2 sons and a daughter.  One son lives in [place] and the other 2 children are in [place].  Patient used to work for a [employer] and then later for the local [place].  ADVANCE DIRECTIVES:  unknown  CODE STATUS: DNR  PAST MEDICAL HISTORY: Past Medical History:  Diagnosis Date  . Acute MI, inferoposterior wall ([place]) 09/30/2014  . Claustrophobia   . Coronary artery disease   . Hypercholesteremia   . Hypertension   . MI, old     . 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 [facility]) 10/27/2018    PAST SURGICAL HISTORY:  Past Surgical History:  Procedure Laterality Date  . APPENDECTOMY     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  . BLADDER NECK SUSPENSION    . CHOLECYSTECTOMY N/A 08/14/2018   Procedure: LAPAROSCOPIC CHOLECYSTECTOMY;  Surgeon: Jules Husbands, MD;  Location: [facility] ORS;  Service: General;  Laterality: N/A;  . CORONARY STENT PLACEMENT    . ENDOBRONCHIAL ULTRASOUND N/A 10/21/2018   Procedure: ENDOBRONCHIAL ULTRASOUND;  Surgeon: Laverle Hobby, MD;  Location: [facility] ORS;  Service: Pulmonary;  Laterality: N/A;  . HERNIA REPAIR    . LEFT HEART CATHETERIZATION WITH CORONARY ANGIOGRAM N/A 09/29/2014   Procedure: LEFT HEART CATHETERIZATION WITH CORONARY ANGIOGRAM;  Surgeon: Clent Demark, MD;  Location: [facility] CATH LAB;  Service: Cardiovascular;  Laterality: N/A;  . PORTACATH PLACEMENT Right 10/28/2018   Procedure: INSERTION PORT-A-CATH;  Surgeon: Jules Husbands, MD;  Location: [facility] ORS;  Service: General;  Laterality: Right;    HEMATOLOGY/ONCOLOGY HISTORY:    Small cell lung cancer ([place])   10/27/2018 Initial Diagnosis    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 [facility])    10/27/2018 -  Chemotherapy    The patient had palonosetron (ALOXI) injection 0.25 mg, 0.25 mg, Intravenous,  Once, 2 of 4 cycles Administration: 0.25 mg (11/02/2018), 0.25 mg (11/23/2018) pegfilgrastim-cbqv (UDENYCA) injection 6 mg, 6 mg, Subcutaneous, Once, 2 of 4 cycles Administration: 6 mg (11/07/2018) CARBOplatin (PARAPLATIN) 390 mg in sodium chloride 0.9 % 250 mL chemo infusion, 390 mg (100 % of original dose 394.5 mg), Intravenous,  Once, 2  of 4 cycles Dose modification:   (original dose 394.5 mg, Cycle 1) Administration: 390 mg (11/02/2018), 390 mg (11/23/2018) etoposide (VEPESID) 180 mg in sodium chloride 0.9 % 500 mL chemo infusion, 100 mg/m2 = 180 mg, Intravenous,  Once, 2 of 4 cycles Administration: 180 mg (11/02/2018), 180 mg (11/03/2018), 180 mg (11/04/2018),  180 mg (11/23/2018) fosaprepitant (EMEND) 150 mg, dexamethasone (DECADRON) 12 mg in sodium chloride 0.9 % 145 mL IVPB, , Intravenous,  Once, 2 of 4 cycles Administration:  (11/02/2018),  (11/23/2018) atezolizumab (TECENTRIQ) 1,200 mg in sodium chloride 0.9 % 250 mL chemo infusion, 1,200 mg, Intravenous, Once, 1 of 7 cycles Administration: 1,200 mg (11/23/2018)  for chemotherapy treatment.      ALLERGIES:  has No Known Allergies.  MEDICATIONS:  Current Outpatient Medications  Medication Sig Dispense Refill  . albuterol (PROAIR HFA) 108 (90 Base) MCG/ACT inhaler Inhale 2 puffs into the lungs every 6 (six) hours as needed for wheezing or shortness of breath. 18 g 2  . ALPRAZolam (XANAX) 0.5 MG tablet Take medication with you to MRI. Take 1 tablet prior to MRI. 5 tablet 0  . AMBULATORY NON FORMULARY MEDICATION Medication Name: Please provide patient with Nebulizer. DX: J44.9 1 each 0  . aspirin EC 81 MG tablet Take 81 mg by mouth daily.    [name] Kitchen atorvastatin (LIPITOR) 80 MG tablet Take 0.5 tablets (40 mg total) by mouth daily at 6 PM. (Patient taking differently: Take 80 mg by mouth at bedtime. ) 30 tablet 3  . B Complex Vitamins (VITAMIN B COMPLEX PO) Take 1 tablet by mouth daily.     . benzonatate (TESSALON PERLES) 100 MG capsule Take 2 capsules (200 mg total) by mouth 3 (three) times daily. 90 capsule 2  . CALCIUM PO Take 1 tablet by mouth daily.     . cetirizine (ZYRTEC) 10 MG tablet Take 10 mg by mouth daily.    . chlorhexidine (PERIDEX) 0.12 % solution Use as directed 15 mLs in the mouth or throat 2 (two) times daily. 473 mL 0  . Cholecalciferol (VITAMIN D) 2000 units CAPS Take 1 capsule (2,000 Units total) by mouth daily. 30 capsule   . dexamethasone (DECADRON) 4 MG tablet Take '8mg'$  daily for 2 days after chemtherapy treatments. 30 tablet 1  . docusate sodium (COLACE) 100 MG capsule Take 100 mg by mouth daily.    [name] Kitchen dronabinol (MARINOL) 5 MG capsule Take 1 capsule (5 mg total) by mouth 2 (two)  times daily before a meal. 60 capsule 0  . Fluticasone-Umeclidin-Vilant (TRELEGY ELLIPTA) 100-62.5-25 MCG/INH AEPB Inhale 1 puff into the lungs daily. 1 each 12  . HYDROcodone-acetaminophen (NORCO/VICODIN) 5-325 MG tablet Take 1 tablet by mouth every 6 (six) hours as needed for moderate pain. 30 tablet 0  . ipratropium-albuterol (DUONEB) 0.5-2.5 (3) MG/3ML SOLN Take 3 mLs by nebulization 3 (three) times daily. DX. J44.9 360 mL 1  . lidocaine-prilocaine (EMLA) cream Apply to affected area once 30 g 3  . metoprolol tartrate (LOPRESSOR) 25 MG tablet Take 12.5 mg by mouth 2 (two) times daily.    . nitroGLYCERIN (NITROSTAT) 0.4 MG SL tablet Place 1 tablet (0.4 mg total) under the tongue every 5 (five) minutes x 3 doses as needed for chest pain. 25 tablet 12  . omeprazole (PRILOSEC) 40 MG capsule TAKE 1 CAPSULE(40 MG) BY MOUTH DAILY 90 capsule 0  . ondansetron (ZOFRAN) 8 MG tablet Take 1 tablet (8 mg total) by mouth 2 (two) times daily as needed for refractory  nausea / vomiting. Start on day 3 after carboplatin chemo. 30 tablet 1  . potassium chloride SA (K-DUR,KLOR-CON) 20 MEQ tablet TAKE 1 TABLET(20 MEQ) BY MOUTH DAILY 90 tablet 1  . prochlorperazine (COMPAZINE) 10 MG tablet TAKE 1 TABLET BY MOUTH EVERY 6 HOURS AS NEEDED FOR NAUSEA OR VOMITING 337 tablet 1  . sertraline (ZOLOFT) 25 MG tablet Take 1 tablet (25 mg total) by mouth daily. 30 tablet 2  . sucralfate (CARAFATE) 1 g tablet TAKE 1 TABLET BY MOUTH FOUR TIMES DAILY, WITH MEALS& AT BEDTIME 368 tablet 0   No current facility-administered medications for this visit.     VITAL SIGNS: There were no vitals taken for this visit. There were no vitals filed for this visit.  Estimated body mass index is 23.08 kg/m as calculated from the following:   Height as of 11/02/18: '5\' 5"'$  (1.651 m).   Weight as of 11/23/18: 138 lb 11.2 oz (62.9 kg).  LABS: CBC:    Component Value Date/Time   WBC 13.3 (H) 11/23/2018 0840   HGB 10.1 (L) 11/23/2018 0840    HGB 14.5 09/19/2018 1038   HCT 29.9 (L) 11/23/2018 0840   HCT 42.7 09/19/2018 1038   PLT 315 11/23/2018 0840   PLT 265 09/19/2018 1038   MCV 86.2 11/23/2018 0840   MCV 91 09/19/2018 1038   NEUTROABS 9.2 (H) 11/23/2018 0840   NEUTROABS 5.1 09/19/2018 1038   LYMPHSABS 1.8 11/23/2018 0840   LYMPHSABS 2.0 09/19/2018 1038   MONOABS 1.5 (H) 11/23/2018 0840   EOSABS 0.0 11/23/2018 0840   EOSABS 0.3 09/19/2018 1038   BASOSABS 0.1 11/23/2018 0840   BASOSABS 0.1 09/19/2018 1038   Comprehensive Metabolic Panel:    Component Value Date/Time   NA 136 11/23/2018 0840   NA 136 09/19/2018 1038   K 3.8 11/23/2018 0840   CL 102 11/23/2018 0840   CO2 24 11/23/2018 0840   BUN 15 11/23/2018 0840   BUN 6 (L) 09/19/2018 1038   CREATININE 0.64 11/23/2018 0840   GLUCOSE 93 11/23/2018 0840   CALCIUM 9.2 11/23/2018 0840   AST 15 11/23/2018 0840   ALT 10 11/23/2018 0840   ALKPHOS 114 11/23/2018 0840   BILITOT 0.5 11/23/2018 0840   BILITOT 0.5 09/19/2018 1038   PROT 7.2 11/23/2018 0840   PROT 7.4 09/19/2018 1038   ALBUMIN 3.3 (L) 11/23/2018 0840   ALBUMIN 4.3 09/19/2018 1038    RADIOGRAPHIC STUDIES: Mr Jeri Cos QQ Contrast  Result Date: 10/28/2018 CLINICAL DATA:  71 year old female with recently diagnosed large left lung mass. EXAM: MRI HEAD WITHOUT AND WITH CONTRAST TECHNIQUE: Multiplanar, multiecho pulse sequences of the brain and surrounding structures were obtained without and with intravenous contrast. CONTRAST:  6 milliliters Gadavist COMPARISON:  Noncontrast head and face CT 07/27/2016. FINDINGS: Brain: No abnormal enhancement identified. No midline shift, mass effect, or evidence of intracranial mass lesion. No dural thickening. No restricted diffusion to suggest acute infarction. No ventriculomegaly, extra-axial collection or acute intracranial hemorrhage. Cervicomedullary junction and pituitary are within normal limits. Patchy and confluent bilateral cerebral white matter T2 and FLAIR  hyperintensity, including involvement in bilateral temporal lobe white matter. No cortical encephalomalacia or chronic cerebral blood products. Mild to moderate T2 heterogeneity in the bilateral deep gray matter nuclei and the pons. Negative cerebellum. Vascular: Loss of the distal left vertebral artery flow void on series 7, image 4. other major intracranial vascular flow voids are preserved. The major dural venous sinuses are enhancing and appear patent. Skull and  upper cervical spine: Negative visible cervical spine and spinal cord. Visualized bone marrow signal is within normal limits. Sinuses/Orbits: Negative orbit soft tissues. Mild sphenoid sinus mucosal thickening, other sinuses are clear. Other: Mastoids are clear. Visible internal auditory structures appear normal. Scalp and face soft tissues appear negative. IMPRESSION: 1. No metastatic disease or acute intracranial abnormality identified. 2. 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 3. Moderately advanced but nonspecific cerebral white matter signal changes, most commonly due to chronic small vessel disease. Lesser signal changes in the deep gray matter and pons may also be small vessel related. Electronically Signed   By: Genevie Ann M.D.   On: 10/28/2018 10:49   Nm Pet Image Initial (pi) Skull Base To Thigh  Result Date: 10/26/2018 CLINICAL DATA:  Initial treatment strategy for lung mass with mediastinal and hilar lymphadenopathy. EXAM: NUCLEAR MEDICINE PET SKULL BASE TO THIGH TECHNIQUE: 8.3 mCi F-18 FDG was injected intravenously. Full-ring PET imaging was performed from the skull base to thigh after the radiotracer. CT data was obtained and used for attenuation correction and anatomic localization. Fasting blood glucose: 91 mg/dl COMPARISON:  Chest CT 10/20/2018. FINDINGS: Mediastinal blood pool activity: SUV max 2.2 NECK: Bulky lymphadenopathy in the right neck is markedly hypermetabolic. 1.7 cm short axis index lymph node in the  right supraclavicular region is identified on 49/3. SUV max = 23.4. Incidental CT findings: none CHEST: The bulky confluent mediastinal and left hilar lymphadenopathy is markedly hypermetabolic. Anterior mediastinal nodal conglomeration demonstrates SUV max = 28.3 (70/3). Central mediastinal disease extending into the left hilum shows confluent hypermetabolism with SUV max = 29.4. Soft tissue in the posterior lower left hemithorax adjacent to the pleural fluid is markedly hypermetabolic with SUV max = 93.7. Incidental CT findings: Marked mass-effect/occlusion of the left mainstem bronchus again noted. Small left pleural effusion noted. ABDOMEN/PELVIS: Hypermetabolic lymphadenopathy is identified in the hepato duodenal ligament. 1.5 cm short axis portal caval lymph node (142/3) demonstrates SUV max = 17.5 Incidental CT findings: Gallbladder surgically absent. No adrenal nodule or mass. There is abdominal aortic atherosclerosis without aneurysm. Pelvic floor laxity evident. SKELETON: Despite lack of an underlying bony abnormality, there is marked hypermetabolism in the posterior right acetabulum with SUV max = 24.9. No other hypermetabolic bone lesions are identified. Incidental CT findings: none IMPRESSION: 1. Large left lung mass is markedly hypermetabolic in the confluent lymphadenopathy in the mediastinum and in bilateral hilar regions also shows marked hypermetabolism. 2. Hypermetabolic metastatic lymphadenopathy in the right neck and supraclavicular region. 3. Hypermetabolic lymphadenopathy in the hepato duodenal ligament consistent with metastatic disease. 4. Large hypermetabolic lesion posterior right acetabulum without correlate of CT finding. Features highly suspicious for bony metastatic disease. Electronically Signed   By: Misty Stanley M.D.   On: 10/26/2018 15:22   Dg Chest Port 1 View  Result Date: 10/28/2018 CLINICAL DATA:  Status post Port-A-Cath placement. EXAM: PORTABLE CHEST 1 VIEW COMPARISON:   Radiographs of October 17, 2018. FINDINGS: There is nearly complete opacification of the left hemithorax which is progressed since prior exam. This is most consistent with atelectasis and associated pleural effusion. Significant right to left mediastinal shift is noted. There is been interval placement of right internal jugular Port-A-Cath with distal tip in expected position of the SVC. No pneumothorax is noted. Right lung is clear although hyperexpanded. Bony thorax is unremarkable. IMPRESSION: Interval placement of right internal jugular Port-A-Cath with distal tip in expected position of SVC. No pneumothorax is noted. There is now complete opacification of  left hemithorax which is progressed compared to prior exam, most consistent with atelectasis and possible pleural effusion given the degree of mediastinal shift. Electronically Signed   By: Marijo Conception, M.D.   On: 10/28/2018 16:23   Dg C-arm 1-60 Min-no Report  Result Date: 10/28/2018 Fluoroscopy was utilized by the requesting physician.  No radiographic interpretation.    PERFORMANCE STATUS (ECOG) : 1 - Symptomatic but completely ambulatory  Review of Systems As noted above. Otherwise, a complete review of systems is negative.  Physical Exam General: NAD, frail appearing, thin Pulmonary: unlabored Extremities: no edema Skin: no rashes Neurological: Weakness but otherwise nonfocal Psych: tearful at times  IMPRESSION: I met with patient and her husband today in the clinic.    We discussed patient's coping with her cancer.  She remains intermittently tearful during my visit today.  Patient says she has good days and bad.  She thinks the sertraline has helped stabilize her moods.  However she is still having periods of severe depression.  I will increase her dose of sertraline to 50 mg daily.  Patient may take 2 of the 25 mg tablets to equal 50 mg dose.  However, we will send a new prescription to pharmacy for 50 mg  tablets.  Discussed option of a short acting anxiolytic.  Patient has received alprazolam in the past with good effect.  However, patient is more interested in increasing the dose of sertraline today.  Would consider an anxiolytic as a future option if anxiety persists.  Discussed option of support groups and counseling.  Patient is hesitant to attend a support group but would be interested in a referral to a counselor.  Will send information to Sandi, LCSW.  Patient is being followed by OT, ST, RD.  Appreciate their involvement.  Patient says pain is stable.  She is using Norco every 4-6 hours.  She finds that this is currently efficacious and allowing her to maintain functioning.   We discussed advance care planning.  Both patient and husband have completed living wills but these are pending signature of a notary.  Patient says she would not want to be resuscitated or have her life prolonged artificially on machines.  She cites an example of her mother-in-law who was resuscitated and did not want it.  She would be agreeable to short-term hospitalization if necessary.  I completed a MOST form today. The patient and family outlined their wishes for the following treatment decisions:  Cardiopulmonary Resuscitation: Do Not Attempt Resuscitation (DNR/No CPR)  Medical Interventions: Limited Additional Interventions: Use medical treatment, IV fluids and cardiac monitoring as indicated, DO NOT USE intubation or mechanical ventilation. May consider use of less invasive airway support such as BiPAP or CPAP. Also provide comfort measures. Transfer to the hospital if indicated. Avoid intensive care.   Antibiotics: Antibiotics if indicated  IV Fluids: IV fluids if indicated  Feeding Tube: Left Blank     PLAN: 1.  Increase Zoloft to 50 mg daily.  Patient may take two 25 mg tablets to equal 50 mg dose.   2. Continue Norco prn. 3. Bowel regimen prn 4. Continue ST/OT/RD 5. Referral to LCSW 6. MOST  form completed 7. DNR 8. RTC in 3 weeks  Patient expressed understanding and was in agreement with this plan. She also understands that She can call clinic at any time with any questions, concerns, or complaints.    Time Total: 60 minutes  Visit consisted of counseling and education dealing with the complex and  emotionally intense issues of symptom management and palliative care in the setting of serious and potentially life-threatening illness.Greater than 50%  of this time was spent counseling and coordinating care related to the above assessment and plan.  Signed by: Altha Harm, PhD, DNP, NP-C, [facility] [phone] (Work Cell)

## 2018-11-25 ENCOUNTER — Inpatient Hospital Stay: Payer: Medicare Other

## 2018-11-25 VITALS — BP 103/73 | HR 88 | Temp 95.0°F | Resp 18

## 2018-11-25 DIAGNOSIS — Z5111 Encounter for antineoplastic chemotherapy: Secondary | ICD-10-CM | POA: Diagnosis not present

## 2018-11-25 DIAGNOSIS — Z5112 Encounter for antineoplastic immunotherapy: Secondary | ICD-10-CM | POA: Diagnosis not present

## 2018-11-25 DIAGNOSIS — M898X9 Other specified disorders of bone, unspecified site: Secondary | ICD-10-CM | POA: Diagnosis not present

## 2018-11-25 DIAGNOSIS — C3490 Malignant neoplasm of unspecified part of unspecified bronchus or lung: Secondary | ICD-10-CM

## 2018-11-25 DIAGNOSIS — C3402 Malignant neoplasm of left main bronchus: Secondary | ICD-10-CM | POA: Diagnosis not present

## 2018-11-25 DIAGNOSIS — I252 Old myocardial infarction: Secondary | ICD-10-CM | POA: Diagnosis not present

## 2018-11-25 DIAGNOSIS — I2510 Atherosclerotic heart disease of native coronary artery without angina pectoris: Secondary | ICD-10-CM | POA: Diagnosis not present

## 2018-11-25 MED ORDER — SODIUM CHLORIDE 0.9 % IV SOLN: 10.0000 mg | Freq: Once | INTRAVENOUS | Status: DC

## 2018-11-25 MED ORDER — HEPARIN SOD (PORK) LOCK FLUSH 100 UNIT/ML IV SOLN
500.0000 [IU] | Freq: Once | INTRAVENOUS | Status: AC | PRN
  Administered 2018-11-25: 500 [IU]
  Filled 2018-11-25: qty 5

## 2018-11-25 MED ORDER — SODIUM CHLORIDE 0.9 % IV SOLN
100.0000 mg/m2 | Freq: Once | INTRAVENOUS | Status: AC
  Administered 2018-11-25: 180 mg via INTRAVENOUS
  Filled 2018-11-25: qty 9

## 2018-11-25 MED ORDER — SODIUM CHLORIDE 0.9 % IV SOLN
Freq: Once | INTRAVENOUS | Status: AC
  Administered 2018-11-25: 09:00:00 via INTRAVENOUS
  Filled 2018-11-25: qty 250

## 2018-11-25 MED ORDER — DEXAMETHASONE SODIUM PHOSPHATE 10 MG/ML IJ SOLN
10.0000 mg | Freq: Once | INTRAMUSCULAR | Status: AC
  Administered 2018-11-25: 10 mg via INTRAVENOUS
  Filled 2018-11-25: qty 1

## 2018-11-25 NOTE — Progress Notes (Signed)
Pt reports swelling in bilateral legs/ankles. Per Almyra Free CMA per Dr. Tasia Catchings okay to proceed with treatment and NNO at this time.

## 2018-11-28 ENCOUNTER — Inpatient Hospital Stay: Payer: Medicare Other

## 2018-11-28 DIAGNOSIS — Z5112 Encounter for antineoplastic immunotherapy: Secondary | ICD-10-CM | POA: Diagnosis not present

## 2018-11-28 DIAGNOSIS — C3490 Malignant neoplasm of unspecified part of unspecified bronchus or lung: Secondary | ICD-10-CM

## 2018-11-28 DIAGNOSIS — M898X9 Other specified disorders of bone, unspecified site: Secondary | ICD-10-CM | POA: Diagnosis not present

## 2018-11-28 DIAGNOSIS — I2510 Atherosclerotic heart disease of native coronary artery without angina pectoris: Secondary | ICD-10-CM | POA: Diagnosis not present

## 2018-11-28 DIAGNOSIS — C3402 Malignant neoplasm of left main bronchus: Secondary | ICD-10-CM | POA: Diagnosis not present

## 2018-11-28 DIAGNOSIS — I252 Old myocardial infarction: Secondary | ICD-10-CM | POA: Diagnosis not present

## 2018-11-28 DIAGNOSIS — Z5111 Encounter for antineoplastic chemotherapy: Secondary | ICD-10-CM | POA: Diagnosis not present

## 2018-11-28 MED ORDER — PEGFILGRASTIM-CBQV 6 MG/0.6ML ~~LOC~~ SOSY
6.0000 mg | PREFILLED_SYRINGE | Freq: Once | SUBCUTANEOUS | Status: AC
  Administered 2018-11-28: 6 mg via SUBCUTANEOUS

## 2018-11-30 ENCOUNTER — Inpatient Hospital Stay: Payer: Medicare Other | Admitting: Occupational Therapy

## 2018-12-05 LAB — ACID FAST CULTURE WITH REFLEXED SENSITIVITIES

## 2018-12-05 LAB — ACID FAST CULTURE WITH REFLEXED SENSITIVITIES (MYCOBACTERIA): Acid Fast Culture: NEGATIVE

## 2018-12-08 ENCOUNTER — Other Ambulatory Visit: Payer: Self-pay | Admitting: *Deleted

## 2018-12-08 DIAGNOSIS — M898X9 Other specified disorders of bone, unspecified site: Secondary | ICD-10-CM

## 2018-12-08 MED ORDER — HYDROCODONE-ACETAMINOPHEN 5-325 MG PO TABS: 1.0000 | ORAL_TABLET | Freq: Four times a day (QID) | ORAL | 0 refills | Status: DC | PRN

## 2018-12-08 NOTE — Telephone Encounter (Signed)
Pt requesting refill of hydrocodone.

## 2018-12-15 ENCOUNTER — Inpatient Hospital Stay: Payer: Medicare Other | Admitting: Hospice and Palliative Medicine

## 2018-12-15 ENCOUNTER — Ambulatory Visit
Admission: RE | Admit: 2018-12-15 | Discharge: 2018-12-15 | Disposition: A | Discharge status: Home / Self Care | Payer: Medicare Other | Source: Ambulatory Visit | Attending: Oncology | Admitting: Oncology

## 2018-12-15 DIAGNOSIS — C3490 Malignant neoplasm of unspecified part of unspecified bronchus or lung: Secondary | ICD-10-CM | POA: Insufficient documentation

## 2018-12-15 MED ORDER — IOHEXOL 300 MG/ML  SOLN
100.0000 mL | Freq: Once | INTRAMUSCULAR | Status: AC | PRN
  Administered 2018-12-15: 100 mL via INTRAVENOUS

## 2018-12-19 ENCOUNTER — Inpatient Hospital Stay: Payer: Medicare Other

## 2018-12-19 ENCOUNTER — Telehealth: Payer: Self-pay | Admitting: *Deleted

## 2018-12-19 ENCOUNTER — Ambulatory Visit
Admission: RE | Admit: 2018-12-19 | Discharge: 2018-12-19 | Disposition: A | Discharge status: Home / Self Care | Payer: Medicare Other | Source: Ambulatory Visit | Attending: Oncology | Admitting: Oncology

## 2018-12-19 ENCOUNTER — Encounter: Payer: Self-pay | Admitting: Oncology

## 2018-12-19 ENCOUNTER — Inpatient Hospital Stay (HOSPITAL_BASED_OUTPATIENT_CLINIC_OR_DEPARTMENT_OTHER): Payer: Medicare Other | Admitting: Oncology

## 2018-12-19 ENCOUNTER — Other Ambulatory Visit: Payer: Self-pay

## 2018-12-19 VITALS — BP 124/67 | HR 77 | Temp 97.2°F | Resp 18 | Ht 65.0 in | Wt 139.6 lb

## 2018-12-19 DIAGNOSIS — Z95828 Presence of other vascular implants and grafts: Secondary | ICD-10-CM

## 2018-12-19 DIAGNOSIS — T859XXA Unspecified complication of internal prosthetic device, implant and graft, initial encounter: Secondary | ICD-10-CM | POA: Insufficient documentation

## 2018-12-19 DIAGNOSIS — I252 Old myocardial infarction: Secondary | ICD-10-CM | POA: Diagnosis not present

## 2018-12-19 DIAGNOSIS — C3402 Malignant neoplasm of left main bronchus: Secondary | ICD-10-CM

## 2018-12-19 DIAGNOSIS — Z5112 Encounter for antineoplastic immunotherapy: Secondary | ICD-10-CM | POA: Diagnosis not present

## 2018-12-19 DIAGNOSIS — D6481 Anemia due to antineoplastic chemotherapy: Secondary | ICD-10-CM

## 2018-12-19 DIAGNOSIS — Y831 Surgical operation with implant of artificial internal device as the cause of abnormal reaction of the patient, or of later complication, without mention of misadventure at the time of the procedure: Secondary | ICD-10-CM | POA: Diagnosis not present

## 2018-12-19 DIAGNOSIS — R630 Anorexia: Secondary | ICD-10-CM

## 2018-12-19 DIAGNOSIS — Z5111 Encounter for antineoplastic chemotherapy: Secondary | ICD-10-CM

## 2018-12-19 DIAGNOSIS — E871 Hypo-osmolality and hyponatremia: Secondary | ICD-10-CM | POA: Diagnosis not present

## 2018-12-19 DIAGNOSIS — Z87891 Personal history of nicotine dependence: Secondary | ICD-10-CM

## 2018-12-19 DIAGNOSIS — J9610 Chronic respiratory failure, unspecified whether with hypoxia or hypercapnia: Secondary | ICD-10-CM | POA: Diagnosis not present

## 2018-12-19 DIAGNOSIS — G893 Neoplasm related pain (acute) (chronic): Secondary | ICD-10-CM

## 2018-12-19 DIAGNOSIS — R5383 Other fatigue: Secondary | ICD-10-CM

## 2018-12-19 DIAGNOSIS — T82598A Other mechanical complication of other cardiac and vascular devices and implants, initial encounter: Secondary | ICD-10-CM | POA: Diagnosis not present

## 2018-12-19 DIAGNOSIS — C3490 Malignant neoplasm of unspecified part of unspecified bronchus or lung: Secondary | ICD-10-CM

## 2018-12-19 DIAGNOSIS — R1310 Dysphagia, unspecified: Secondary | ICD-10-CM | POA: Diagnosis not present

## 2018-12-19 DIAGNOSIS — M898X9 Other specified disorders of bone, unspecified site: Secondary | ICD-10-CM

## 2018-12-19 DIAGNOSIS — I2510 Atherosclerotic heart disease of native coronary artery without angina pectoris: Secondary | ICD-10-CM | POA: Diagnosis not present

## 2018-12-19 DIAGNOSIS — R110 Nausea: Secondary | ICD-10-CM

## 2018-12-19 HISTORY — PX: IR FLUORO GUIDE CV LINE RIGHT: IMG2283

## 2018-12-19 LAB — CBC WITH DIFFERENTIAL/PLATELET
Abs Immature Granulocytes: 0.24 10*3/uL — ABNORMAL HIGH (ref 0.00–0.07)
Basophils Absolute: 0.1 10*3/uL (ref 0.0–0.1)
Basophils Relative: 1 %
Eosinophils Absolute: 0.1 10*3/uL (ref 0.0–0.5)
Eosinophils Relative: 1 %
HCT: 28.6 % — ABNORMAL LOW (ref 36.0–46.0)
Hemoglobin: 9.3 g/dL — ABNORMAL LOW (ref 12.0–15.0)
Immature Granulocytes: 3 %
Lymphocytes Relative: 18 %
Lymphs Abs: 1.6 10*3/uL (ref 0.7–4.0)
MCH: 29.3 pg (ref 26.0–34.0)
MCHC: 32.5 g/dL (ref 30.0–36.0)
MCV: 90.2 fL (ref 80.0–100.0)
MONOS PCT: 17 %
Monocytes Absolute: 1.6 10*3/uL — ABNORMAL HIGH (ref 0.1–1.0)
NEUTROS PCT: 60 %
Neutro Abs: 5.6 10*3/uL (ref 1.7–7.7)
PLATELETS: 284 10*3/uL (ref 150–400)
RBC: 3.17 MIL/uL — ABNORMAL LOW (ref 3.87–5.11)
RDW: 18.6 % — ABNORMAL HIGH (ref 11.5–15.5)
WBC: 9.2 10*3/uL (ref 4.0–10.5)
nRBC: 0 % (ref 0.0–0.2)

## 2018-12-19 LAB — COMPREHENSIVE METABOLIC PANEL
ALT: 9 U/L (ref 0–44)
AST: 17 U/L (ref 15–41)
Albumin: 2.9 g/dL — ABNORMAL LOW (ref 3.5–5.0)
Alkaline Phosphatase: 95 U/L (ref 38–126)
Anion gap: 9 (ref 5–15)
BUN: 7 mg/dL — ABNORMAL LOW (ref 8–23)
CO2: 32 mmol/L (ref 22–32)
Calcium: 8.6 mg/dL — ABNORMAL LOW (ref 8.9–10.3)
Chloride: 98 mmol/L (ref 98–111)
Creatinine, Ser: 0.71 mg/dL (ref 0.44–1.00)
GFR calc Af Amer: >60 mL/min (ref >60)
GFR calc non Af Amer: >60 mL/min (ref >60)
Glucose, Bld: 107 mg/dL — ABNORMAL HIGH (ref 70–99)
Potassium: 3.6 mmol/L (ref 3.5–5.1)
Sodium: 139 mmol/L (ref 135–145)
Total Bilirubin: 0.5 mg/dL (ref 0.3–1.2)
Total Protein: 6.3 g/dL — ABNORMAL LOW (ref 6.5–8.1)

## 2018-12-19 LAB — TSH: TSH: 2.867 u[IU]/mL (ref 0.350–4.500)

## 2018-12-19 MED ORDER — HEPARIN SOD (PORK) LOCK FLUSH 100 UNIT/ML IV SOLN
500.0000 [IU] | Freq: Once | INTRAVENOUS | Status: DC
  Filled 2018-12-19: qty 5

## 2018-12-19 MED ORDER — HEPARIN SOD (PORK) LOCK FLUSH 100 UNIT/ML IV SOLN
INTRAVENOUS | Status: AC
  Filled 2018-12-19: qty 5

## 2018-12-19 MED ORDER — HEPARIN SODIUM (PORCINE) 5000 UNIT/ML IJ SOLN
INTRAMUSCULAR | Status: AC
  Filled 2018-12-19: qty 1

## 2018-12-19 MED ORDER — SODIUM CHLORIDE 0.9 % IV SOLN
100.0000 mg/m2 | Freq: Once | INTRAVENOUS | Status: AC
  Administered 2018-12-19: 180 mg via INTRAVENOUS
  Filled 2018-12-19: qty 9

## 2018-12-19 MED ORDER — SODIUM CHLORIDE 0.9 % IV SOLN
Freq: Once | INTRAVENOUS | Status: AC
  Administered 2018-12-19: 10:00:00 via INTRAVENOUS
  Filled 2018-12-19: qty 5

## 2018-12-19 MED ORDER — SODIUM CHLORIDE 0.9% FLUSH
10.0000 mL | INTRAVENOUS | Status: DC | PRN
  Administered 2018-12-19: 10 mL via INTRAVENOUS
  Filled 2018-12-19: qty 10

## 2018-12-19 MED ORDER — PALONOSETRON HCL INJECTION 0.25 MG/5ML
0.2500 mg | Freq: Once | INTRAVENOUS | Status: AC
  Administered 2018-12-19: 0.25 mg via INTRAVENOUS
  Filled 2018-12-19: qty 5

## 2018-12-19 MED ORDER — SODIUM CHLORIDE 0.9 % IV SOLN
1200.0000 mg | Freq: Once | INTRAVENOUS | Status: AC
  Administered 2018-12-19: 1200 mg via INTRAVENOUS
  Filled 2018-12-19: qty 20

## 2018-12-19 MED ORDER — IOPAMIDOL (ISOVUE-300) INJECTION 61%
100.0000 mL | Freq: Once | INTRAVENOUS | Status: AC | PRN
  Administered 2018-12-19: 5 mL via INTRAVENOUS

## 2018-12-19 MED ORDER — HYDROCODONE-ACETAMINOPHEN 5-325 MG PO TABS: 1.0000 | ORAL_TABLET | Freq: Two times a day (BID) | ORAL | 0 refills | Status: DC | PRN

## 2018-12-19 MED ORDER — SODIUM CHLORIDE 0.9 % IV SOLN
Freq: Once | INTRAVENOUS | Status: AC
  Administered 2018-12-19: 10:00:00 via INTRAVENOUS
  Filled 2018-12-19: qty 250

## 2018-12-19 MED ORDER — SODIUM CHLORIDE 0.9 % IV SOLN
394.5000 mg | Freq: Once | INTRAVENOUS | Status: AC
  Administered 2018-12-19: 390 mg via INTRAVENOUS
  Filled 2018-12-19: qty 39

## 2018-12-19 NOTE — Progress Notes (Signed)
Patient here this afternoon for port check per Dr Barbie Banner IR,referred by Dr Tasia Catchings in oncology. After procedure Dr Barbie Banner discussed with Dr Lurena Joiner need for port revision with note placed by him. Called office of Dr Dahlia Byes who placed port on 10/28/18,to have procedure set up by them or IR to do procedure if needed.

## 2018-12-19 NOTE — Progress Notes (Addendum)
Nutrition Follow-up:  Patient with lung cancer followed by Dr. Tasia Catchings.  Patient receiving chemotherapy.  Also followed by palliative care.    Met with patient during infusion.  Patient reports that she is waking up hungry. Reports that food is tasting better now.  Reports swallowing has improved.  Reports that she typically has cereal or toast and fruit for breakfast, sometimes egg and sausage patty.  Reports for lunch will fix ramen noodles and 1/2 sandwich.  Dinner is leftovers.  Drinks carnation breakfast essentials but not daily.    Medications: reviewed  Labs: reviewed  Anthropometrics:   Weight stable at 139 lb 9 oz today from 138 lb 11.2 oz on 12/4.     NUTRITION DIAGNOSIS: Inadequate oral intake stable   INTERVENTION:  Reviewed strategies to help increase calories using current eating pattern.   Encouraged carnation instant breakfast daily for added nutrition     MONITORING, EVALUATION, GOAL: weight trends, intake   NEXT VISIT: Monday, Jan 13  after labs  [name], [place], [place] Registered Dietitian [phone] (pager)

## 2018-12-19 NOTE — Telephone Encounter (Signed)
Call was returned to [facility] in radiology.   Per Vickie, patient needs port revision next 3-5 days. Dr. Dahlia Byes originally placed port 10-2018. Vickie was notified that he is on vacation all this week. Vickie states that I.R. could reposition port on Friday this week if needed but wanted to check with our office first since we put port in originally. Per Loletha Carrow, see notes from Dr. Tasia Catchings and Dr. Barbie Banner in Epic regarding request.   Dr. Genevive Bi was contacted and notified of request.   Dr. Genevive Bi wishes to see patient in the office tomorrow, 12-20-18 at 10 am for further evaluation.   Patient contacted and agreeable to see Dr. Genevive Bi tomorrow.

## 2018-12-19 NOTE — Progress Notes (Signed)
Patient here today for follow up.  Patient states that swallowing has gotten better.

## 2018-12-19 NOTE — Progress Notes (Signed)
Port flushes without difficultly, no blood return noted, Per Almyra Free CMA per Dr. Tasia Catchings okay to use port a cath for treatment.

## 2018-12-19 NOTE — Progress Notes (Addendum)
Hematology/Oncology Consult note [facility] Telephone:([phone] [phone]   Patient Care Team: Glean Hess, MD as PCP - General (Internal Medicine) Charolette Forward, MD as Consulting Physician (Cardiology) Telford Nab, RN as Registered Nurse  REFERRING PROVIDER: Dr. 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 Patient recently was referred to see pulmonology Dr. Felicie Morn for persistent cough and dyspnea.  Patient reports coughing up clear sputum for about 2 months.  No hemoptysis.  She also had weight loss. Former smoker, quitting smoking 2 weeks ago.  39-pack-year smoking history  09/30/2018 chest x-ray showed new market volume loss in the left chest area of the left mainstem bronchus worrisome for centrally obstructing mass. 10/17/2018 chest x-ray showed volume loss worsened in the left hemithorax worsening patchy consolidation in the mid to lower left lung with minimal residual aeration and up in the left lung mass not excluded.  Possible small left pleural effusion. 10/31 2019 CT chest with contrast showed large mediastinal mass involving both hilar, left greater than right, consistent with lung carcinoma The mass causes narrowing of the left mainstem bronchus with resultant volume loss on the left and a mediastinal shift to the left.  Moderate size left pleural effusion. 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  Today patient was accompanied by husband and daughter to clinic to discuss diagnosis and management plan. She reports feeling tired feels shortness of breath with exertion.  Persistent coughing, productive with clear sputum. Also reports difficulty  swallowing, feels food sticking in her food pipe. MRI brain was obtained which was negative for intracranial metastatic disease.  INTERVAL HISTORY Kristi [name] is a 72 y.o. female who has above history reviewed by me today presents for follow up visit for management of extensive small cell lung cancer.   #Overall patient reports tolerating chemotherapy with carboplatin, etoposide and Tecentriq. Manageable nausea.  No vomiting.   Overall reports tolerating first cycle of chemotherapy with carbo etoposide and Tecentriq. #She has had bone pain secondary to G-CSF.  Takes tramadol which is not helpful.  Currently on Norco every 6 hours as needed.    Review of Systems  Constitutional: Positive for fatigue. Negative for appetite change, chills and fever.  HENT:   Negative for hearing loss and voice change.   Eyes: Negative for eye problems.  Respiratory: Positive for cough and shortness of breath. Negative for chest tightness.   Cardiovascular: Negative for chest pain.  Gastrointestinal: Negative for abdominal distention, abdominal pain and blood in stool.  Endocrine: Negative for hot flashes.  Genitourinary: Negative for difficulty urinating and frequency.   Musculoskeletal: Negative for arthralgias.  Skin: Negative for itching and rash.  Neurological: Negative for extremity weakness.  Hematological: Negative for adenopathy.  Psychiatric/Behavioral: Negative for confusion.     MEDICAL HISTORY:  Past Medical History:  Diagnosis Date  . Acute MI, inferoposterior wall ([place]) 09/30/2014  . Claustrophobia   . Coronary artery disease   . Hypercholesteremia   . Hypertension   . MI, old   . 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 [facility]) 10/27/2018    SURGICAL HISTORY: Past Surgical History:  Procedure Laterality Date  . APPENDECTOMY     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  . BLADDER NECK SUSPENSION    .  CHOLECYSTECTOMY N/A 08/14/2018   Procedure: LAPAROSCOPIC CHOLECYSTECTOMY;  Surgeon: Jules Husbands, MD;  Location: [facility] ORS;  Service: General;  Laterality: N/A;  . CORONARY STENT PLACEMENT    . ENDOBRONCHIAL ULTRASOUND N/A 10/21/2018   Procedure: ENDOBRONCHIAL ULTRASOUND;  Surgeon: Laverle Hobby, MD;  Location: [facility] ORS;  Service: Pulmonary;  Laterality: N/A;  . HERNIA REPAIR    . LEFT HEART CATHETERIZATION WITH CORONARY ANGIOGRAM N/A 09/29/2014   Procedure: LEFT HEART CATHETERIZATION WITH CORONARY ANGIOGRAM;  Surgeon: Clent Demark, MD;  Location: [facility] CATH LAB;  Service: Cardiovascular;  Laterality: N/A;  . PORTACATH PLACEMENT Right 10/28/2018   Procedure: INSERTION PORT-A-CATH;  Surgeon: Jules Husbands, MD;  Location: [facility] ORS;  Service: General;  Laterality: Right;    SOCIAL HISTORY: Social History   Socioeconomic History  . Marital status: Married    Spouse name: Not on file  . Number of children: Not on file  . Years of education: Not on file  . Highest education level: Not on file  Occupational History  . Not on file  Social Needs  . Financial resource strain: Not on file  . Food insecurity:    Worry: Not on file    Inability: Not on file  . Transportation needs:    Medical: Not on file    Non-medical: Not on file  Tobacco Use  . Smoking status: Former Smoker    Packs/day: 1.00    Years: 39.00    Pack years: 39.00    Types: Cigarettes    Start date: 12/21/1978    Last attempt to quit: 10/16/2018    Years since quitting: 0.1  . Smokeless tobacco: Never Used  Substance and Sexual Activity  . Alcohol use: Yes    Alcohol/week: 0.0 standard drinks    Comment: occassional - approx 1 every 2 weeks   . Drug use: No  . Sexual activity: Yes    Birth control/protection: None  Lifestyle  . Physical activity:    Days per week: Not on file    Minutes per session: Not on file  . Stress: Not on file  Relationships  . Social connections:    Talks on phone: Not on file    Gets together: Not on file    Attends religious service: Not on file    Active member of  club or organization: Not on file    Attends meetings of clubs or organizations: Not on file    Relationship status: Not on file  . Intimate partner violence:    Fear of current or ex partner: Not on file    Emotionally abused: Not on file    Physically abused: Not on file    Forced sexual activity: Not on file  Other Topics Concern  . Not on file  Social History Narrative  . Not on file    FAMILY HISTORY: Family History  Problem Relation Age of Onset  . Alzheimer's disease Mother   . Colon cancer Father   . Breast cancer Neg Hx     ALLERGIES:  has No Known Allergies.  MEDICATIONS:  Current Outpatient Medications  Medication Sig Dispense Refill  . albuterol (PROAIR HFA) 108 (90 Base) MCG/ACT inhaler Inhale 2 puffs into the lungs every 6 (six) hours as needed for wheezing or shortness of breath. 18 g 2  . ALPRAZolam (XANAX) 0.5 MG tablet Take medication with you to MRI. Take 1 tablet prior to MRI. 5 tablet 0  . AMBULATORY NON FORMULARY MEDICATION Medication Name:  Please provide patient with Nebulizer. DX: J44.9 1 each 0  . aspirin EC 81 MG tablet Take 81 mg by mouth daily.    [name] Kitchen atorvastatin (LIPITOR) 80 MG tablet Take 0.5 tablets (40 mg total) by mouth daily at 6 PM. (Patient taking differently: Take 80 mg by mouth at bedtime. ) 30 tablet 3  . B Complex Vitamins (VITAMIN B COMPLEX PO) Take 1 tablet by mouth daily.     . benzonatate (TESSALON PERLES) 100 MG capsule Take 2 capsules (200 mg total) by mouth 3 (three) times daily. 90 capsule 2  . CALCIUM PO Take 1 tablet by mouth daily.     . cetirizine (ZYRTEC) 10 MG tablet Take 10 mg by mouth daily.    . chlorhexidine (PERIDEX) 0.12 % solution Use as directed 15 mLs in the mouth or throat 2 (two) times daily. 473 mL 0  . Cholecalciferol (VITAMIN D) 2000 units CAPS Take 1 capsule (2,000 Units total) by mouth daily. 30 capsule   . dexamethasone (DECADRON) 4 MG tablet Take 8mg  daily for 2 days after chemtherapy treatments. 30 tablet 1   . docusate sodium (COLACE) 100 MG capsule Take 100 mg by mouth daily.    [name] Kitchen dronabinol (MARINOL) 5 MG capsule Take 1 capsule (5 mg total) by mouth 2 (two) times daily before a meal. 60 capsule 0  . Fluticasone-Umeclidin-Vilant (TRELEGY ELLIPTA) 100-62.5-25 MCG/INH AEPB Inhale 1 puff into the lungs daily. 1 each 12  . HYDROcodone-acetaminophen (NORCO/VICODIN) 5-325 MG tablet Take 1 tablet by mouth every 12 (twelve) hours as needed for moderate pain. 30 tablet 0  . ipratropium-albuterol (DUONEB) 0.5-2.5 (3) MG/3ML SOLN Take 3 mLs by nebulization 3 (three) times daily. DX. J44.9 360 mL 1  . lidocaine-prilocaine (EMLA) cream Apply to affected area once 30 g 3  . metoprolol tartrate (LOPRESSOR) 25 MG tablet Take 12.5 mg by mouth 2 (two) times daily.    . nitroGLYCERIN (NITROSTAT) 0.4 MG SL tablet Place 1 tablet (0.4 mg total) under the tongue every 5 (five) minutes x 3 doses as needed for chest pain. 25 tablet 12  . omeprazole (PRILOSEC) 40 MG capsule TAKE 1 CAPSULE(40 MG) BY MOUTH DAILY 90 capsule 0  . ondansetron (ZOFRAN) 8 MG tablet Take 1 tablet (8 mg total) by mouth 2 (two) times daily as needed for refractory nausea / vomiting. Start on day 3 after carboplatin chemo. 30 tablet 1  . potassium chloride SA (K-DUR,KLOR-CON) 20 MEQ tablet TAKE 1 TABLET(20 MEQ) BY MOUTH DAILY 90 tablet 1  . prochlorperazine (COMPAZINE) 10 MG tablet TAKE 1 TABLET BY MOUTH EVERY 6 HOURS AS NEEDED FOR NAUSEA OR VOMITING 337 tablet 1  . sertraline (ZOLOFT) 50 MG tablet Take 1 tablet (50 mg total) by mouth daily. 30 tablet 1  . sucralfate (CARAFATE) 1 g tablet TAKE 1 TABLET BY MOUTH FOUR TIMES DAILY, WITH MEALS& AT BEDTIME 368 tablet 0   No current facility-administered medications for this visit.    Facility-Administered Medications Ordered in Other Visits  Medication Dose Route Frequency Provider Last Rate Last Dose  . etoposide (VEPESID) 180 mg in sodium chloride 0.9 % 500 mL chemo infusion  100 mg/m2 (Treatment Plan  Recorded) Intravenous Once Earlie Server, MD 509 mL/hr at 12/19/18 1230 180 mg at 12/19/18 1230  . heparin lock flush 100 unit/mL  500 Units Intravenous Once Earlie Server, MD      . sodium chloride flush (NS) 0.9 % injection 10 mL  10 mL Intravenous PRN Earlie Server, MD  10 mL at 12/19/18 0815     PHYSICAL EXAMINATION: ECOG PERFORMANCE STATUS: 1 - Symptomatic but completely ambulatory Vitals:   12/19/18 0903  BP: 124/67  Pulse: 77  Resp: 18  Temp: (!) 97.2 F (36.2 C)  SpO2: 98%   Filed Weights   12/19/18 0903  Weight: 139 lb 9 oz (63.3 kg)    Physical Exam Constitutional:      General: She is not in acute distress.    Comments: Thin  HENT:     Head: Normocephalic and atraumatic.  Eyes:     General: No scleral icterus.    Pupils: Pupils are equal, round, and reactive to light.  Neck:     Musculoskeletal: Normal range of motion and neck supple.  Cardiovascular:     Rate and Rhythm: Normal rate and regular rhythm.     Heart sounds: Normal heart sounds. No murmur.  Pulmonary:     Effort: Pulmonary effort is normal. No respiratory distress.     Breath sounds: No wheezing.  Abdominal:     General: Bowel sounds are normal. There is no distension.     Palpations: Abdomen is soft. There is no mass.     Tenderness: There is no abdominal tenderness.  Musculoskeletal: Normal range of motion.        General: No deformity.  Skin:    General: Skin is warm and dry.     Findings: No erythema or rash.  Neurological:     Mental Status: She is alert and oriented to person, place, and time.     Cranial Nerves: No cranial nerve deficit.     Coordination: Coordination normal.  Psychiatric:        Behavior: Behavior normal.        Thought Content: Thought content normal.      LABORATORY DATA:  I have reviewed the data as listed Lab Results  Component Value Date   WBC 9.2 12/19/2018   HGB 9.3 (L) 12/19/2018   HCT 28.6 (L) 12/19/2018   MCV 90.2 12/19/2018   PLT 284 12/19/2018   Recent  Labs    08/12/18 1438  11/02/18 0810 11/23/18 0840 12/19/18 0800  NA 131*   < > 132* 136 139  K 3.7   < > 3.3* 3.8 3.6  CL 97*   < > 95* 102 98  CO2 25   < > 26 24 32  GLUCOSE 122*   < > 97 93 107*  BUN 9   < > 9 15 7*  CREATININE 0.74   < > 0.56 0.64 0.71  CALCIUM 9.1   < > 9.3 9.2 8.6*  GFRNONAA >60   < > >60 >60 >60  GFRAA >60   < > >60 >60 >60  PROT 8.0   < > 7.5 7.2 6.3*  ALBUMIN 4.3   < > 3.2* 3.3* 2.9*  AST 33   < > 33 15 17  ALT 26   < > 13 10 9   ALKPHOS 81   < > 109 114 95  BILITOT 0.6   < > 0.5 0.5 0.5  BILIDIR 0.2  --   --   --   --   IBILI 0.4  --   --   --   --    < > = values in this interval not displayed.   Iron/TIBC/Ferritin/ %Sat No results found for: IRON, TIBC, FERRITIN, IRONPCTSAT   RADIOGRAPHIC STUDIES: I have personally reviewed the radiological images as listed and  agreed with the findings in the report. . 10/20/2018 CT chest w contrast . Large mediastinal mass involving both hila left much greater than right consistent with lung carcinoma possibly small cell lung carcinoma. This mass causes narrowing of the left mainstem bronchus with resultant volume loss on the left and mediastinal shift to the Left. 2. Moderate size left pleural effusion. 3. Coronary artery calcifications and moderate thoracic aortic atherosclerosis.  PET 10/26/2018 . Large left lung mass is markedly hypermetabolic in the confluent lymphadenopathy in the mediastinum and in bilateral hilar regions also shows marked hypermetabolism. 2. Hypermetabolic metastatic lymphadenopathy in the right neck and supraclavicular region. 3. Hypermetabolic lymphadenopathy in the hepato duodenal ligament consistent with metastatic disease. 4. Large hypermetabolic lesion posterior right acetabulum without correlate of CT finding. Features highly suspicious for bony metastatic disease.  MRI brain 10/28/2018 1. No metastatic disease or acute intracranial abnormality identified. 2. 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  3. Moderately advanced but nonspecific cerebral white matter signal changes, most commonly due to chronic small vessel disease. Lesser signal changes in the deep gray matter and pons may also be small vessel related.  ASSESSMENT & PLAN:  1. Small cell lung cancer ([place])   2. Port-A-Cath in place   3. 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 Neoplasm related pain   5. Encounter for antineoplastic immunotherapy   6. Encounter for antineoplastic chemotherapy    #Extensive small cell lung cancer CT chest abdomen pelvis was independent reviewed by me and discussed with patient.  Tolerates chemotherapy with G-CSF support.  Labs are reviewed and discussed with patient.  Counts are acceptable to proceed with cycle 3 carboplatin, etoposide, Tecentriq. She will receive Udenyca on day 4 or 5 Advised patient to utilize Claritin for 4 days for bone pain secondary to [place] use.  #Anemia secondary to chemotherapy.  Hemoglobin 9.3.  Continue monitor. #Hyponatremia, sodium level has improved. #Chronic respiratory failure/emphysema/hypoxia, on home oxygen as needed.  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 Is well controlled. #Poor appetite, suggest a trial of Marinol 5 mg twice daily. We spent sufficient time to discuss many aspect of care, questions were answered to patient's satisfaction. The patient knows to call the clinic with any problems questions or concerns.  Return of visit: 3 weeks for assessment prior to second cycle of chemotherapy treatment.   Earlie Server, MD, PhD Hematology Oncology [facility] at [facility] Pager- [phone] 12/19/2018

## 2018-12-19 NOTE — Telephone Encounter (Signed)
-----   Message from Larna Daughters sent at 12/19/2018  3:05 PM EST ----- Jocelyn Lamer with Radiology is calling and needs to speak to one of the nurses about a port and needs to speak to someone asap. She can be reached at [phone]. Please call and advise.

## 2018-12-20 ENCOUNTER — Inpatient Hospital Stay (HOSPITAL_BASED_OUTPATIENT_CLINIC_OR_DEPARTMENT_OTHER): Payer: Medicare Other | Admitting: Hospice and Palliative Medicine

## 2018-12-20 ENCOUNTER — Ambulatory Visit: Payer: Medicare Other | Admitting: Cardiothoracic Surgery

## 2018-12-20 ENCOUNTER — Other Ambulatory Visit: Payer: Self-pay

## 2018-12-20 ENCOUNTER — Inpatient Hospital Stay: Payer: Medicare Other

## 2018-12-20 VITALS — BP 115/66 | HR 81 | Temp 96.0°F | Resp 18

## 2018-12-20 VITALS — BP 103/65 | HR 77 | Temp 98.0°F | Resp 18 | Wt 147.5 lb

## 2018-12-20 DIAGNOSIS — M898X9 Other specified disorders of bone, unspecified site: Secondary | ICD-10-CM | POA: Diagnosis not present

## 2018-12-20 DIAGNOSIS — I252 Old myocardial infarction: Secondary | ICD-10-CM | POA: Diagnosis not present

## 2018-12-20 DIAGNOSIS — F329 Major depressive disorder, single episode, unspecified: Secondary | ICD-10-CM

## 2018-12-20 DIAGNOSIS — Z66 Do not resuscitate: Secondary | ICD-10-CM

## 2018-12-20 DIAGNOSIS — I10 Essential (primary) hypertension: Secondary | ICD-10-CM

## 2018-12-20 DIAGNOSIS — I2510 Atherosclerotic heart disease of native coronary artery without angina pectoris: Secondary | ICD-10-CM | POA: Diagnosis not present

## 2018-12-20 DIAGNOSIS — F40240 Claustrophobia: Secondary | ICD-10-CM | POA: Diagnosis not present

## 2018-12-20 DIAGNOSIS — Z5111 Encounter for antineoplastic chemotherapy: Secondary | ICD-10-CM | POA: Diagnosis not present

## 2018-12-20 DIAGNOSIS — F32A Depression, unspecified: Secondary | ICD-10-CM

## 2018-12-20 DIAGNOSIS — C3402 Malignant neoplasm of left main bronchus: Secondary | ICD-10-CM | POA: Diagnosis not present

## 2018-12-20 DIAGNOSIS — R52 Pain, unspecified: Secondary | ICD-10-CM

## 2018-12-20 DIAGNOSIS — Z515 Encounter for palliative care: Secondary | ICD-10-CM | POA: Diagnosis not present

## 2018-12-20 DIAGNOSIS — G893 Neoplasm related pain (acute) (chronic): Secondary | ICD-10-CM

## 2018-12-20 DIAGNOSIS — K5900 Constipation, unspecified: Secondary | ICD-10-CM | POA: Diagnosis not present

## 2018-12-20 DIAGNOSIS — Z5112 Encounter for antineoplastic immunotherapy: Secondary | ICD-10-CM | POA: Diagnosis not present

## 2018-12-20 DIAGNOSIS — C3490 Malignant neoplasm of unspecified part of unspecified bronchus or lung: Secondary | ICD-10-CM

## 2018-12-20 DIAGNOSIS — R1310 Dysphagia, unspecified: Secondary | ICD-10-CM

## 2018-12-20 MED ORDER — DEXAMETHASONE SODIUM PHOSPHATE 10 MG/ML IJ SOLN
10.0000 mg | Freq: Once | INTRAMUSCULAR | Status: AC
  Administered 2018-12-20: 10 mg via INTRAVENOUS
  Filled 2018-12-20: qty 1

## 2018-12-20 MED ORDER — HEPARIN SOD (PORK) LOCK FLUSH 100 UNIT/ML IV SOLN
500.0000 [IU] | Freq: Once | INTRAVENOUS | Status: AC
  Administered 2018-12-20: 500 [IU] via INTRAVENOUS
  Filled 2018-12-20: qty 5

## 2018-12-20 MED ORDER — SODIUM CHLORIDE 0.9 % IV SOLN
Freq: Once | INTRAVENOUS | Status: AC
  Administered 2018-12-20: 14:00:00 via INTRAVENOUS
  Filled 2018-12-20: qty 250

## 2018-12-20 MED ORDER — SODIUM CHLORIDE 0.9 % IV SOLN
100.0000 mg/m2 | Freq: Once | INTRAVENOUS | Status: AC
  Administered 2018-12-20: 180 mg via INTRAVENOUS
  Filled 2018-12-20: qty 9

## 2018-12-20 MED ORDER — SODIUM CHLORIDE 0.9 % IV SOLN: 10.0000 mg | Freq: Once | INTRAVENOUS | Status: DC

## 2018-12-20 MED ORDER — SODIUM CHLORIDE 0.9% FLUSH
10.0000 mL | INTRAVENOUS | Status: DC | PRN
  Administered 2018-12-20: 10 mL via INTRAVENOUS
  Filled 2018-12-20: qty 10

## 2018-12-20 NOTE — Progress Notes (Signed)
Patient here for follow up with palliative care.

## 2018-12-20 NOTE — Progress Notes (Signed)
[place]  [phone] [phone]   Name: [name] Date: 12/20/2018 MRN: [id number]  DOB: 12/17/1946  Patient Care Team: Glean Hess, MD as PCP - General (Internal Medicine) Charolette Forward, MD as Consulting Physician (Cardiology) Telford Nab, RN as Registered Nurse    REASON FOR CONSULTATION: Palliative Care consult requested for this 72 y.o. female with multiple medical problems including stage IV small cell lung cancer.  PMH also notable for CAD with history of acute MI, claustrophobia, hypertension, and hyperlipidemia.  Patient was recently seen and examined by pulmonology due to worsening exertional dyspnea.  She was found to have a central obstructing mass and mediastinal adenopathy on CT.  Patient is status post bronchoscopy with biopsy confirming small cell lung cancer.  PET CT scan revealed hypermetabolic areas involving lymphadenopathy in the neck and supraclavicular region, hepatoduodenal ligament, and disease of the right acetabulum.  Patient is pending initiation of chemotherapy with Atezolizumab, Carboplatin, and Etoposide.  Additionally she is being considered for palliative XRT of the chest mass to help with dysphagia.  Palliative care was asked to help address goals and symptoms.   SOCIAL HISTORY:    Patient is married and lives at home with her husband of 69 years.  They moved here from [place], [place] around 4 years ago.  Patient has 2 sons and a daughter.  One son lives in [place] and the other 2 children are in [place].  Patient used to work for a [employer] and then later for the local [place].  ADVANCE DIRECTIVES:  unknown  CODE STATUS: DNR  PAST MEDICAL HISTORY: Past Medical History:  Diagnosis Date  . Acute MI, inferoposterior wall ([place]) 09/30/2014  . Claustrophobia   . Coronary artery disease   . Hypercholesteremia   . Hypertension   . MI, old    . 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 [facility]) 10/27/2018    PAST SURGICAL HISTORY:  Past Surgical History:  Procedure Laterality Date  . APPENDECTOMY     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  . BLADDER NECK SUSPENSION    . CHOLECYSTECTOMY N/A 08/14/2018   Procedure: LAPAROSCOPIC CHOLECYSTECTOMY;  Surgeon: Jules Husbands, MD;  Location: [facility] ORS;  Service: General;  Laterality: N/A;  . CORONARY STENT PLACEMENT    . ENDOBRONCHIAL ULTRASOUND N/A 10/21/2018   Procedure: ENDOBRONCHIAL ULTRASOUND;  Surgeon: Laverle Hobby, MD;  Location: [facility] ORS;  Service: Pulmonary;  Laterality: N/A;  . HERNIA REPAIR    . IR FLUORO GUIDE CV LINE RIGHT  12/19/2018  . LEFT HEART CATHETERIZATION WITH CORONARY ANGIOGRAM N/A 09/29/2014   Procedure: LEFT HEART CATHETERIZATION WITH CORONARY ANGIOGRAM;  Surgeon: Clent Demark, MD;  Location: [facility] CATH LAB;  Service: Cardiovascular;  Laterality: N/A;  . PORTACATH PLACEMENT Right 10/28/2018   Procedure: INSERTION PORT-A-CATH;  Surgeon: Jules Husbands, MD;  Location: [facility] ORS;  Service: General;  Laterality: Right;    HEMATOLOGY/ONCOLOGY HISTORY:    Small cell lung cancer ([place])   10/27/2018 Initial Diagnosis    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 [facility])    11/02/2018 -  Chemotherapy    The patient had palonosetron (ALOXI) injection 0.25 mg, 0.25 mg, Intravenous,  Once, 3 of 4 cycles Administration: 0.25 mg (11/02/2018), 0.25 mg (11/23/2018), 0.25 mg (12/19/2018) pegfilgrastim-cbqv (UDENYCA) injection 6 mg, 6 mg, Subcutaneous, Once, 3 of 4 cycles Administration: 6 mg (11/07/2018), 6 mg (11/28/2018) CARBOplatin (PARAPLATIN) 390 mg in sodium chloride 0.9 % 250 mL  chemo infusion, 390 mg (100 % of original dose 394.5 mg), Intravenous,  Once, 3 of 4 cycles Dose modification:   (original dose 394.5 mg, Cycle 1) Administration: 390 mg (11/02/2018), 390 mg (11/23/2018), 390 mg (12/19/2018) etoposide (VEPESID) 180 mg in sodium chloride 0.9 % 500 mL chemo infusion, 100 mg/m2 = 180 mg,  Intravenous,  Once, 3 of 4 cycles Administration: 180 mg (11/02/2018), 180 mg (11/03/2018), 180 mg (11/04/2018), 180 mg (11/23/2018), 180 mg (11/24/2018), 180 mg (11/25/2018), 180 mg (12/19/2018) fosaprepitant (EMEND) 150 mg, dexamethasone (DECADRON) 12 mg in sodium chloride 0.9 % 145 mL IVPB, , Intravenous,  Once, 3 of 4 cycles Administration:  (11/02/2018),  (11/23/2018),  (12/19/2018) atezolizumab (TECENTRIQ) 1,200 mg in sodium chloride 0.9 % 250 mL chemo infusion, 1,200 mg, Intravenous, Once, 2 of 7 cycles Administration: 1,200 mg (11/23/2018), 1,200 mg (12/19/2018)  for chemotherapy treatment.      ALLERGIES:  has No Known Allergies.  MEDICATIONS:  Current Outpatient Medications  Medication Sig Dispense Refill  . albuterol (PROAIR HFA) 108 (90 Base) MCG/ACT inhaler Inhale 2 puffs into the lungs every 6 (six) hours as needed for wheezing or shortness of breath. 18 g 2  . ALPRAZolam (XANAX) 0.5 MG tablet Take medication with you to MRI. Take 1 tablet prior to MRI. 5 tablet 0  . AMBULATORY NON FORMULARY MEDICATION Medication Name: Please provide patient with Nebulizer. DX: J44.9 1 each 0  . aspirin EC 81 MG tablet Take 81 mg by mouth daily.    [name] Kitchen atorvastatin (LIPITOR) 80 MG tablet Take 0.5 tablets (40 mg total) by mouth daily at 6 PM. (Patient taking differently: Take 80 mg by mouth at bedtime. ) 30 tablet 3  . B Complex Vitamins (VITAMIN B COMPLEX PO) Take 1 tablet by mouth daily.     . benzonatate (TESSALON PERLES) 100 MG capsule Take 2 capsules (200 mg total) by mouth 3 (three) times daily. 90 capsule 2  . CALCIUM PO Take 1 tablet by mouth daily.     . cetirizine (ZYRTEC) 10 MG tablet Take 10 mg by mouth daily.    . chlorhexidine (PERIDEX) 0.12 % solution Use as directed 15 mLs in the mouth or throat 2 (two) times daily. 473 mL 0  . Cholecalciferol (VITAMIN D) 2000 units CAPS Take 1 capsule (2,000 Units total) by mouth daily. 30 capsule   . dexamethasone (DECADRON) 4 MG tablet Take 63m  daily for 2 days after chemtherapy treatments. 30 tablet 1  . docusate sodium (COLACE) 100 MG capsule Take 100 mg by mouth daily.    .[name] Kitchendronabinol (MARINOL) 5 MG capsule Take 1 capsule (5 mg total) by mouth 2 (two) times daily before a meal. 60 capsule 0  . Fluticasone-Umeclidin-Vilant (TRELEGY ELLIPTA) 100-62.5-25 MCG/INH AEPB Inhale 1 puff into the lungs daily. 1 each 12  . HYDROcodone-acetaminophen (NORCO/VICODIN) 5-325 MG tablet Take 1 tablet by mouth every 12 (twelve) hours as needed for moderate pain. 30 tablet 0  . ipratropium-albuterol (DUONEB) 0.5-2.5 (3) MG/3ML SOLN Take 3 mLs by nebulization 3 (three) times daily. DX. J44.9 360 mL 1  . lidocaine-prilocaine (EMLA) cream Apply to affected area once 30 g 3  . metoprolol tartrate (LOPRESSOR) 25 MG tablet Take 12.5 mg by mouth 2 (two) times daily.    . nitroGLYCERIN (NITROSTAT) 0.4 MG SL tablet Place 1 tablet (0.4 mg total) under the tongue every 5 (five) minutes x 3 doses as needed for chest pain. 25 tablet 12  . omeprazole (PRILOSEC) 40 MG capsule TAKE  1 CAPSULE(40 MG) BY MOUTH DAILY 90 capsule 0  . ondansetron (ZOFRAN) 8 MG tablet Take 1 tablet (8 mg total) by mouth 2 (two) times daily as needed for refractory nausea / vomiting. Start on day 3 after carboplatin chemo. 30 tablet 1  . potassium chloride SA (K-DUR,KLOR-CON) 20 MEQ tablet TAKE 1 TABLET(20 MEQ) BY MOUTH DAILY 90 tablet 1  . prochlorperazine (COMPAZINE) 10 MG tablet TAKE 1 TABLET BY MOUTH EVERY 6 HOURS AS NEEDED FOR NAUSEA OR VOMITING 337 tablet 1  . sertraline (ZOLOFT) 50 MG tablet Take 1 tablet (50 mg total) by mouth daily. 30 tablet 1  . sucralfate (CARAFATE) 1 g tablet TAKE 1 TABLET BY MOUTH FOUR TIMES DAILY, WITH MEALS& AT BEDTIME 368 tablet 0   No current facility-administered medications for this visit.    Facility-Administered Medications Ordered in Other Visits  Medication Dose Route Frequency Provider Last Rate Last Dose  . 0.9 %  sodium chloride infusion    Intravenous Once Earlie Server, MD      . dexamethasone (DECADRON) 10 mg in sodium chloride 0.9 % 50 mL IVPB  10 mg Intravenous Once Earlie Server, MD      . etoposide (VEPESID) 180 mg in sodium chloride 0.9 % 500 mL chemo infusion  100 mg/m2 (Treatment Plan Recorded) Intravenous Once Earlie Server, MD        VITAL SIGNS: BP 103/65 (BP Location: Right Arm)   Pulse 77   Temp 98 F (36.7 C)   Resp 18   Wt 147 lb 8 oz (66.9 kg)   BMI 24.55 kg/m  Filed Weights   12/20/18 1313  Weight: 147 lb 8 oz (66.9 kg)    Estimated body mass index is 24.55 kg/m as calculated from the following:   Height as of 12/19/18: _0  (1.651 m).   Weight as of this encounter: 147 lb 8 oz (66.9 kg).  LABS: CBC:    Component Value Date/Time   WBC 9.2 12/19/2018 0800   HGB 9.3 (L) 12/19/2018 0800   HGB 14.5 09/19/2018 1038   HCT 28.6 (L) 12/19/2018 0800   HCT 42.7 09/19/2018 1038   PLT 284 12/19/2018 0800   PLT 265 09/19/2018 1038   MCV 90.2 12/19/2018 0800   MCV 91 09/19/2018 1038   NEUTROABS 5.6 12/19/2018 0800   NEUTROABS 5.1 09/19/2018 1038   LYMPHSABS 1.6 12/19/2018 0800   LYMPHSABS 2.0 09/19/2018 1038   MONOABS 1.6 (H) 12/19/2018 0800   EOSABS 0.1 12/19/2018 0800   EOSABS 0.3 09/19/2018 1038   BASOSABS 0.1 12/19/2018 0800   BASOSABS 0.1 09/19/2018 1038   Comprehensive Metabolic Panel:    Component Value Date/Time   NA 139 12/19/2018 0800   NA 136 09/19/2018 1038   K 3.6 12/19/2018 0800   CL 98 12/19/2018 0800   CO2 32 12/19/2018 0800   BUN 7 (L) 12/19/2018 0800   BUN 6 (L) 09/19/2018 1038   CREATININE 0.71 12/19/2018 0800   GLUCOSE 107 (H) 12/19/2018 0800   CALCIUM 8.6 (L) 12/19/2018 0800   AST 17 12/19/2018 0800   ALT 9 12/19/2018 0800   ALKPHOS 95 12/19/2018 0800   BILITOT 0.5 12/19/2018 0800   BILITOT 0.5 09/19/2018 1038   PROT 6.3 (L) 12/19/2018 0800   PROT 7.4 09/19/2018 1038   ALBUMIN 2.9 (L) 12/19/2018 0800   ALBUMIN 4.3 09/19/2018 1038    RADIOGRAPHIC STUDIES: Ct Chest W  Contrast  Result Date: 12/15/2018 CLINICAL DATA:  New diagnosis of small cell lung  cancer, on chemotherapy. Ex-smoker. Status post bronchoscopy and sampling 10/21/2018. EXAM: CT CHEST, ABDOMEN, AND PELVIS WITH CONTRAST TECHNIQUE: Multidetector CT imaging of the chest, abdomen and pelvis was performed following the standard protocol during bolus administration of intravenous contrast. CONTRAST:  14m OMNIPAQUE IOHEXOL 300 MG/ML  SOLN COMPARISON:  Chest radiograph 10/28/2018. PET 10/26/2018. Most recent chest CT 10/20/2018. Most recent abdominopelvic CT 08/12/2018. FINDINGS: CT CHEST FINDINGS Cardiovascular: A right-sided Port-A-Cath with narrowing of the high SVC just inferior to the tip of the catheter. The catheter is positioned within the right brachiocephalic vein with near occlusive thrombus surrounding its tip, including on image 15/2 and image 66 coronal. Aortic and branch vessel atherosclerosis. Tortuous thoracic aorta. Normal heart size with minimal pericardial fluid or thickening. Multivessel coronary artery atherosclerosis. No central pulmonary embolism, on this non-dedicated study. Mediastinum/Nodes: Right supraclavicular adenopathy has resolved. Marked improvement in infiltrative adenopathy within the mediastinum and left hilum. Index right paratracheal node of 1.2 cm on image 17/2 measured 4.0 cm on 10/20/2018 (when remeasured). Subcarinal component measures 3.9 cm on image 26/2 versus 7.3 cm on the prior (when remeasured). Residual soft tissue thickening within the left hilum, including image 29/2. No well-defined residual adenopathy. Lungs/Pleura: No pleural fluid. Trace left pleural thickening. Mild centrilobular and paraseptal emphysema. Right upper lobe calcified granuloma on image 29/4. Markedly improved left-sided aeration. The left endobronchial tree is patent. Areas of mild volume loss and interstitial thickening within the left lower lobe. Musculoskeletal: No acute osseous abnormality. CT  ABDOMEN PELVIS FINDINGS Hepatobiliary: Normal liver. Cholecystectomy, without biliary ductal dilatation. Pancreas: Normal, without mass or ductal dilatation. Spleen: Normal in size, without focal abnormality. Adrenals/Urinary Tract: Normal adrenal glands. Normal kidneys, without hydronephrosis. Cystocele. Bladder wall thickening is eccentric right on image 105/2, mild. Stomach/Bowel: Normal stomach, without wall thickening. Scattered colonic diverticula. Normal terminal ileum. Ventral abdominal wall laxity contains nonobstructive small bowel including on image 75/2. Vascular/Lymphatic: Advanced aortic and branch vessel atherosclerosis. The portacaval hypermetabolic lymph node on 116/10/9604PET has significantly decreased in size, including at 7 mm on image 55/2 versus 1.5 cm on the prior. No new abdominopelvic adenopathy. Reproductive: Normal uterus and adnexa. Other: Moderate pelvic floor laxity. No significant free fluid. Prior ventral abdominal wall hernia repair with laxity and recurrent hernia positioned inferiorly. No significant free fluid. Musculoskeletal: Possible vague sclerosis within the posterior right acetabulum, in the region of hypermetabolism on prior PET. Mild osteopenia. Grade 1 L4-5 anterolisthesis. IMPRESSION: 1. Marked response to therapy of thoracic adenopathy. 2. No new or progressive disease in the chest. 3. Coronary artery atherosclerosis. 4. 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 5. No new or progressive disease in the abdomen or pelvis. 6. Pelvic floor laxity, cystocele with bladder wall thickening, suggesting a component of outlet obstruction. 7. Possible vague sclerosis within the posterior right acetabulum, in the region of hypermetabolism on prior PET. Likely an area of treated metastasis. 8. Central line tip in right brachiocephalic vein with surrounding occlusive thrombus. Aortic Atherosclerosis (ICD10-I70.0). These results will be called to the ordering clinician or  representative by the Radiologist Assistant, and communication documented in the PACS or zVision Dashboard. Electronically Signed   By: KAbigail MiyamotoM.D.   On: 12/15/2018 13:01   Ct Abdomen Pelvis W Contrast  Result Date: 12/15/2018 CLINICAL DATA:  New diagnosis of small cell lung cancer, on chemotherapy. Ex-smoker. Status post bronchoscopy and sampling 10/21/2018. EXAM: CT CHEST, ABDOMEN, AND PELVIS WITH CONTRAST TECHNIQUE: Multidetector CT imaging of the chest, abdomen and pelvis was performed  following the standard protocol during bolus administration of intravenous contrast. CONTRAST:  116m OMNIPAQUE IOHEXOL 300 MG/ML  SOLN COMPARISON:  Chest radiograph 10/28/2018. PET 10/26/2018. Most recent chest CT 10/20/2018. Most recent abdominopelvic CT 08/12/2018. FINDINGS: CT CHEST FINDINGS Cardiovascular: A right-sided Port-A-Cath with narrowing of the high SVC just inferior to the tip of the catheter. The catheter is positioned within the right brachiocephalic vein with near occlusive thrombus surrounding its tip, including on image 15/2 and image 66 coronal. Aortic and branch vessel atherosclerosis. Tortuous thoracic aorta. Normal heart size with minimal pericardial fluid or thickening. Multivessel coronary artery atherosclerosis. No central pulmonary embolism, on this non-dedicated study. Mediastinum/Nodes: Right supraclavicular adenopathy has resolved. Marked improvement in infiltrative adenopathy within the mediastinum and left hilum. Index right paratracheal node of 1.2 cm on image 17/2 measured 4.0 cm on 10/20/2018 (when remeasured). Subcarinal component measures 3.9 cm on image 26/2 versus 7.3 cm on the prior (when remeasured). Residual soft tissue thickening within the left hilum, including image 29/2. No well-defined residual adenopathy. Lungs/Pleura: No pleural fluid. Trace left pleural thickening. Mild centrilobular and paraseptal emphysema. Right upper lobe calcified granuloma on image 29/4. Markedly  improved left-sided aeration. The left endobronchial tree is patent. Areas of mild volume loss and interstitial thickening within the left lower lobe. Musculoskeletal: No acute osseous abnormality. CT ABDOMEN PELVIS FINDINGS Hepatobiliary: Normal liver. Cholecystectomy, without biliary ductal dilatation. Pancreas: Normal, without mass or ductal dilatation. Spleen: Normal in size, without focal abnormality. Adrenals/Urinary Tract: Normal adrenal glands. Normal kidneys, without hydronephrosis. Cystocele. Bladder wall thickening is eccentric right on image 105/2, mild. Stomach/Bowel: Normal stomach, without wall thickening. Scattered colonic diverticula. Normal terminal ileum. Ventral abdominal wall laxity contains nonobstructive small bowel including on image 75/2. Vascular/Lymphatic: Advanced aortic and branch vessel atherosclerosis. The portacaval hypermetabolic lymph node on 176/19/5093PET has significantly decreased in size, including at 7 mm on image 55/2 versus 1.5 cm on the prior. No new abdominopelvic adenopathy. Reproductive: Normal uterus and adnexa. Other: Moderate pelvic floor laxity. No significant free fluid. Prior ventral abdominal wall hernia repair with laxity and recurrent hernia positioned inferiorly. No significant free fluid. Musculoskeletal: Possible vague sclerosis within the posterior right acetabulum, in the region of hypermetabolism on prior PET. Mild osteopenia. Grade 1 L4-5 anterolisthesis. IMPRESSION: 1. Marked response to therapy of thoracic adenopathy. 2. No new or progressive disease in the chest. 3. Coronary artery atherosclerosis. 4. 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 5. No new or progressive disease in the abdomen or pelvis. 6. Pelvic floor laxity, cystocele with bladder wall thickening, suggesting a component of outlet obstruction. 7. Possible vague sclerosis within the posterior right acetabulum, in the region of hypermetabolism on prior PET. Likely an area of  treated metastasis. 8. Central line tip in right brachiocephalic vein with surrounding occlusive thrombus. Aortic Atherosclerosis (ICD10-I70.0). These results will be called to the ordering clinician or representative by the Radiologist Assistant, and communication documented in the PACS or zVision Dashboard. Electronically Signed   By: KAbigail MiyamotoM.D.   On: 12/15/2018 13:01   Ir Fluoro Guide Cv Line Right  Result Date: 12/19/2018 INDICATION: Right jugular Port-A-Cath is difficult to aspirate EXAM: RIGHT JUGULAR PORT-A-CATH INJECTION MEDICATIONS: None ANESTHESIA/SEDATION: None FLUOROSCOPY TIME:  Fluoroscopy Time:  minutes 6 seconds (19 mGy). COMPLICATIONS: None immediate. PROCEDURE: Informed written consent was obtained from the patient after a thorough discussion of the procedural risks, benefits and alternatives. All questions were addressed. Maximal Sterile Barrier Technique was utilized including caps, mask, sterile gowns, sterile gloves,  sterile drape, hand hygiene and skin antiseptic. A timeout was performed prior to the initiation of the procedure. The patient was placed on the table with a pre-existing Hubert needle access in the Port-A-Cath from the cancer center. Contrast was injected and imaging was obtained. The needle was then removed after heparinizing the port reservoir. FINDINGS: A right jugular Port-A-Cath is in place. The tip is in the distal right innominate vein. There is marked irregularity of the right innominate vein which is severely low narrowed as it inserts into the upper SVC. There is reflux into collateral venous structures. There is no filling defect to suggest acute thrombus. The mid and lower SVC is normal in caliber. IMPRESSION: The tip of the Port-A-Cath is in the distal right innominate vein at its insertion into the SVC. This vein as well as the upper SVC is severely narrowed. There is no filling defect to suggest thrombus. Recommendations are for a port removal, with  angioplasty of the innominate vein and SVC, followed by replacement of the right jugular Port-A-Cath. Electronically Signed   By: Marybelle Killings M.D.   On: 12/19/2018 15:15    PERFORMANCE STATUS (ECOG) : 1 - Symptomatic but completely ambulatory  Review of Systems As noted above. Otherwise, a complete review of systems is negative.  Physical Exam General: NAD, frail appearing, thin Pulmonary: CTA ant fields Cardiac: RRR ABD: soft, nttp Extremities: trace LE edema Skin: no rashes Neurological: Weakness but otherwise nonfocal Psych: tearful at times  IMPRESSION: I met with patient and her husband today in the clinic.    Patient says she is doing reasonably well. She denies any acute issues or concerns today. She says her mood is much improved and has been stable in increased dose of Zoloft.   Pain is also reportedly stable. She has been using Norco TID with good effect.   She has some constipation. I recommended starting daily miralax and using prn senna.   Husband says he is doing well and coping with patient's illness.   PLAN: 1.  Continue Zoloft 50 mg daily  2. Continue Norco prn. 3. Start daily MiraLax and use senna prn 4. DNR 8. RTC in 3-4 weeks  Patient expressed understanding and was in agreement with this plan. She also understands that She can call clinic at any time with any questions, concerns, or complaints.    Time Total: 20 minutes  Visit consisted of counseling and education dealing with the complex and emotionally intense issues of symptom management and palliative care in the setting of serious and potentially life-threatening illness.Greater than 50%  of this time was spent counseling and coordinating care related to the above assessment and plan.  Signed by: Altha Harm, PhD, DNP, NP-C, [facility] [phone] (Work Cell)

## 2018-12-20 NOTE — Progress Notes (Signed)
Dr. Tasia Catchings aware of dye study on 12/30 and MD recommendations, orders to proceed with VP16 today thru port access as long as no issue with flushing port.  Port flushes well without issue, slight tinge of blood noted when flushed.  Per patient, radiologist stated port could be used for treatment today.

## 2018-12-22 ENCOUNTER — Inpatient Hospital Stay: Payer: Medicare Other | Attending: Oncology

## 2018-12-22 DIAGNOSIS — Z5112 Encounter for antineoplastic immunotherapy: Secondary | ICD-10-CM | POA: Diagnosis not present

## 2018-12-22 DIAGNOSIS — Z87891 Personal history of nicotine dependence: Secondary | ICD-10-CM | POA: Diagnosis not present

## 2018-12-22 DIAGNOSIS — D649 Anemia, unspecified: Secondary | ICD-10-CM | POA: Diagnosis not present

## 2018-12-22 DIAGNOSIS — C3402 Malignant neoplasm of left main bronchus: Secondary | ICD-10-CM | POA: Diagnosis not present

## 2018-12-22 DIAGNOSIS — Z5189 Encounter for other specified aftercare: Secondary | ICD-10-CM | POA: Diagnosis not present

## 2018-12-22 DIAGNOSIS — D6959 Other secondary thrombocytopenia: Secondary | ICD-10-CM | POA: Diagnosis not present

## 2018-12-22 DIAGNOSIS — Z5111 Encounter for antineoplastic chemotherapy: Secondary | ICD-10-CM | POA: Diagnosis not present

## 2018-12-22 DIAGNOSIS — D6481 Anemia due to antineoplastic chemotherapy: Secondary | ICD-10-CM | POA: Diagnosis not present

## 2018-12-22 DIAGNOSIS — R05 Cough: Secondary | ICD-10-CM | POA: Diagnosis not present

## 2018-12-22 DIAGNOSIS — R0602 Shortness of breath: Secondary | ICD-10-CM | POA: Insufficient documentation

## 2018-12-22 DIAGNOSIS — C3490 Malignant neoplasm of unspecified part of unspecified bronchus or lung: Secondary | ICD-10-CM

## 2018-12-22 DIAGNOSIS — I252 Old myocardial infarction: Secondary | ICD-10-CM | POA: Diagnosis not present

## 2018-12-22 MED ORDER — SODIUM CHLORIDE 0.9% FLUSH
10.0000 mL | INTRAVENOUS | Status: DC | PRN
  Administered 2018-12-22: 10 mL via INTRAVENOUS
  Filled 2018-12-22: qty 10

## 2018-12-22 MED ORDER — DEXAMETHASONE SODIUM PHOSPHATE 10 MG/ML IJ SOLN
10.0000 mg | Freq: Once | INTRAMUSCULAR | Status: AC
  Administered 2018-12-22: 10 mg via INTRAVENOUS
  Filled 2018-12-22: qty 1

## 2018-12-22 MED ORDER — SODIUM CHLORIDE 0.9 % IV SOLN
100.0000 mg/m2 | Freq: Once | INTRAVENOUS | Status: AC
  Administered 2018-12-22: 180 mg via INTRAVENOUS
  Filled 2018-12-22: qty 9

## 2018-12-22 MED ORDER — HEPARIN SOD (PORK) LOCK FLUSH 100 UNIT/ML IV SOLN
500.0000 [IU] | Freq: Once | INTRAVENOUS | Status: AC
  Administered 2018-12-22: 500 [IU] via INTRAVENOUS

## 2018-12-22 MED ORDER — SODIUM CHLORIDE 0.9 % IV SOLN
Freq: Once | INTRAVENOUS | Status: AC
  Administered 2018-12-22: 14:00:00 via INTRAVENOUS
  Filled 2018-12-22: qty 250

## 2018-12-23 ENCOUNTER — Encounter (INDEPENDENT_AMBULATORY_CARE_PROVIDER_SITE_OTHER): Payer: Self-pay

## 2018-12-23 ENCOUNTER — Inpatient Hospital Stay: Payer: Medicare Other

## 2018-12-23 ENCOUNTER — Encounter: Payer: Self-pay | Admitting: *Deleted

## 2018-12-23 ENCOUNTER — Telehealth (INDEPENDENT_AMBULATORY_CARE_PROVIDER_SITE_OTHER): Payer: Self-pay

## 2018-12-23 DIAGNOSIS — D6959 Other secondary thrombocytopenia: Secondary | ICD-10-CM | POA: Diagnosis not present

## 2018-12-23 DIAGNOSIS — C3402 Malignant neoplasm of left main bronchus: Secondary | ICD-10-CM | POA: Diagnosis not present

## 2018-12-23 DIAGNOSIS — D649 Anemia, unspecified: Secondary | ICD-10-CM | POA: Diagnosis not present

## 2018-12-23 DIAGNOSIS — Z5111 Encounter for antineoplastic chemotherapy: Secondary | ICD-10-CM | POA: Diagnosis not present

## 2018-12-23 DIAGNOSIS — C3490 Malignant neoplasm of unspecified part of unspecified bronchus or lung: Secondary | ICD-10-CM

## 2018-12-23 DIAGNOSIS — D6481 Anemia due to antineoplastic chemotherapy: Secondary | ICD-10-CM | POA: Diagnosis not present

## 2018-12-23 DIAGNOSIS — Z5112 Encounter for antineoplastic immunotherapy: Secondary | ICD-10-CM | POA: Diagnosis not present

## 2018-12-23 MED ORDER — PEGFILGRASTIM-CBQV 6 MG/0.6ML ~~LOC~~ SOSY
6.0000 mg | PREFILLED_SYRINGE | Freq: Once | SUBCUTANEOUS | Status: AC
  Administered 2018-12-23: 6 mg via SUBCUTANEOUS

## 2018-12-23 NOTE — Telephone Encounter (Signed)
Spoke with the patient and gave her the day/time for her procedure with Dr. Lucky Cowboy on 01/02/2019 as well as any pre-procedure instructions. This information will be mailed out as well.

## 2018-12-23 NOTE — Progress Notes (Signed)
Spoke with patient regarding the recommendation by IR to remove port-a-cath.  Patient is very adamant that she doesn't want the surgeon who placed her current port-a-cath to redo this procedure.  Patient states that she knows Dr Lucky Cowboy at [facility] and request for him to remove current port-a-cath and place a new one in.  Contacted [facility] to confirm that Dr Lucky Cowboy would be agreeable to remove and replace her port-a-cath.  Patient has been scheduled for this procedure to be done 01/02/19.

## 2018-12-26 ENCOUNTER — Inpatient Hospital Stay: Payer: Medicare Other

## 2018-12-26 DIAGNOSIS — D649 Anemia, unspecified: Secondary | ICD-10-CM | POA: Diagnosis not present

## 2018-12-26 DIAGNOSIS — D6481 Anemia due to antineoplastic chemotherapy: Secondary | ICD-10-CM | POA: Diagnosis not present

## 2018-12-26 DIAGNOSIS — D6959 Other secondary thrombocytopenia: Secondary | ICD-10-CM | POA: Diagnosis not present

## 2018-12-26 DIAGNOSIS — R5383 Other fatigue: Secondary | ICD-10-CM

## 2018-12-26 DIAGNOSIS — Z5112 Encounter for antineoplastic immunotherapy: Secondary | ICD-10-CM | POA: Diagnosis not present

## 2018-12-26 DIAGNOSIS — Z5111 Encounter for antineoplastic chemotherapy: Secondary | ICD-10-CM | POA: Diagnosis not present

## 2018-12-26 DIAGNOSIS — C3402 Malignant neoplasm of left main bronchus: Secondary | ICD-10-CM | POA: Diagnosis not present

## 2018-12-26 LAB — COMPREHENSIVE METABOLIC PANEL
ALT: 20 U/L (ref 0–44)
AST: 21 U/L (ref 15–41)
Albumin: 3.5 g/dL (ref 3.5–5.0)
Alkaline Phosphatase: 88 U/L (ref 38–126)
Anion gap: 13 (ref 5–15)
BUN: 16 mg/dL (ref 8–23)
CO2: 26 mmol/L (ref 22–32)
Calcium: 8.6 mg/dL — ABNORMAL LOW (ref 8.9–10.3)
Chloride: 98 mmol/L (ref 98–111)
Creatinine, Ser: 0.52 mg/dL (ref 0.44–1.00)
GFR calc Af Amer: >60 mL/min (ref >60)
Glucose, Bld: 102 mg/dL — ABNORMAL HIGH (ref 70–99)
Potassium: 3.7 mmol/L (ref 3.5–5.1)
Sodium: 137 mmol/L (ref 135–145)
Total Bilirubin: 0.8 mg/dL (ref 0.3–1.2)
Total Protein: 6.6 g/dL (ref 6.5–8.1)

## 2018-12-26 LAB — CBC WITH DIFFERENTIAL/PLATELET
Abs Immature Granulocytes: 2.17 10*3/uL — ABNORMAL HIGH (ref 0.00–0.07)
Basophils Absolute: 0.1 10*3/uL (ref 0.0–0.1)
Basophils Relative: 1 %
Eosinophils Absolute: 0.1 10*3/uL (ref 0.0–0.5)
Eosinophils Relative: 0 %
HCT: 26.6 % — ABNORMAL LOW (ref 36.0–46.0)
Hemoglobin: 8.6 g/dL — ABNORMAL LOW (ref 12.0–15.0)
Immature Granulocytes: 15 %
Lymphocytes Relative: 11 %
Lymphs Abs: 1.6 10*3/uL (ref 0.7–4.0)
MCH: 29.3 pg (ref 26.0–34.0)
MCHC: 32.3 g/dL (ref 30.0–36.0)
MCV: 90.5 fL (ref 80.0–100.0)
Monocytes Absolute: 0.2 10*3/uL (ref 0.1–1.0)
Monocytes Relative: 1 %
Neutro Abs: 10.4 10*3/uL — ABNORMAL HIGH (ref 1.7–7.7)
Neutrophils Relative %: 72 %
Platelets: 134 10*3/uL — ABNORMAL LOW (ref 150–400)
RBC: 2.94 MIL/uL — AB (ref 3.87–5.11)
RDW: 18.7 % — AB (ref 11.5–15.5)
WBC: 14.5 10*3/uL — AB (ref 4.0–10.5)
nRBC: 0 % (ref 0.0–0.2)

## 2018-12-26 LAB — TSH: TSH: 2.166 u[IU]/mL (ref 0.350–4.500)

## 2018-12-27 ENCOUNTER — Telehealth: Payer: Self-pay | Admitting: *Deleted

## 2018-12-27 DIAGNOSIS — R05 Cough: Secondary | ICD-10-CM

## 2018-12-27 DIAGNOSIS — C3490 Malignant neoplasm of unspecified part of unspecified bronchus or lung: Secondary | ICD-10-CM

## 2018-12-27 DIAGNOSIS — R0602 Shortness of breath: Secondary | ICD-10-CM

## 2018-12-27 DIAGNOSIS — R059 Cough, unspecified: Secondary | ICD-10-CM

## 2018-12-27 NOTE — Telephone Encounter (Signed)
Pt's granddaughter, Marye Round, called in to report pt has been experiencing increasing SOB with cough recently. Would like to know if needs to be seen. Per Dr. Tasia Catchings, pt can be added on tomorrow with labs and chest xray prior. Orders entered. Pt's granddaughter made aware of appt. Instructed for pt to have chest xray prior to appt.

## 2018-12-28 ENCOUNTER — Inpatient Hospital Stay: Payer: Medicare Other

## 2018-12-28 ENCOUNTER — Other Ambulatory Visit: Payer: Self-pay | Admitting: Oncology

## 2018-12-28 ENCOUNTER — Other Ambulatory Visit: Payer: Self-pay

## 2018-12-28 ENCOUNTER — Inpatient Hospital Stay: Payer: Medicare Other | Admitting: Occupational Therapy

## 2018-12-28 ENCOUNTER — Ambulatory Visit
Admission: RE | Admit: 2018-12-28 | Discharge: 2018-12-28 | Disposition: A | Discharge status: Home / Self Care | Payer: Medicare Other | Source: Ambulatory Visit | Attending: Oncology | Admitting: Oncology

## 2018-12-28 ENCOUNTER — Encounter: Payer: Self-pay | Admitting: Oncology

## 2018-12-28 ENCOUNTER — Inpatient Hospital Stay (HOSPITAL_BASED_OUTPATIENT_CLINIC_OR_DEPARTMENT_OTHER): Payer: Medicare Other | Admitting: Oncology

## 2018-12-28 VITALS — BP 104/73 | HR 94 | Temp 97.4°F | Resp 18 | Wt 138.3 lb

## 2018-12-28 DIAGNOSIS — R0602 Shortness of breath: Secondary | ICD-10-CM | POA: Insufficient documentation

## 2018-12-28 DIAGNOSIS — C3490 Malignant neoplasm of unspecified part of unspecified bronchus or lung: Secondary | ICD-10-CM

## 2018-12-28 DIAGNOSIS — R531 Weakness: Secondary | ICD-10-CM

## 2018-12-28 DIAGNOSIS — D696 Thrombocytopenia, unspecified: Secondary | ICD-10-CM

## 2018-12-28 DIAGNOSIS — C3432 Malignant neoplasm of lower lobe, left bronchus or lung: Secondary | ICD-10-CM | POA: Diagnosis not present

## 2018-12-28 DIAGNOSIS — D649 Anemia, unspecified: Secondary | ICD-10-CM

## 2018-12-28 DIAGNOSIS — D6959 Other secondary thrombocytopenia: Secondary | ICD-10-CM | POA: Diagnosis not present

## 2018-12-28 DIAGNOSIS — R05 Cough: Secondary | ICD-10-CM

## 2018-12-28 DIAGNOSIS — R059 Cough, unspecified: Secondary | ICD-10-CM

## 2018-12-28 DIAGNOSIS — R5383 Other fatigue: Secondary | ICD-10-CM

## 2018-12-28 DIAGNOSIS — D6481 Anemia due to antineoplastic chemotherapy: Secondary | ICD-10-CM | POA: Diagnosis not present

## 2018-12-28 DIAGNOSIS — Z5112 Encounter for antineoplastic immunotherapy: Secondary | ICD-10-CM | POA: Diagnosis not present

## 2018-12-28 DIAGNOSIS — I252 Old myocardial infarction: Secondary | ICD-10-CM | POA: Diagnosis not present

## 2018-12-28 DIAGNOSIS — Z5111 Encounter for antineoplastic chemotherapy: Secondary | ICD-10-CM | POA: Diagnosis not present

## 2018-12-28 DIAGNOSIS — C3402 Malignant neoplasm of left main bronchus: Secondary | ICD-10-CM | POA: Diagnosis not present

## 2018-12-28 LAB — CBC WITH DIFFERENTIAL/PLATELET
Abs Immature Granulocytes: 0.03 10*3/uL (ref 0.00–0.07)
Basophils Absolute: 0.1 10*3/uL (ref 0.0–0.1)
Basophils Relative: 2 %
EOS PCT: 2 %
Eosinophils Absolute: 0.1 10*3/uL (ref 0.0–0.5)
HCT: 24.3 % — ABNORMAL LOW (ref 36.0–46.0)
Hemoglobin: 8.1 g/dL — ABNORMAL LOW (ref 12.0–15.0)
Immature Granulocytes: 1 %
Lymphocytes Relative: 44 %
Lymphs Abs: 1.2 10*3/uL (ref 0.7–4.0)
MCH: 29.7 pg (ref 26.0–34.0)
MCHC: 33.3 g/dL (ref 30.0–36.0)
MCV: 89 fL (ref 80.0–100.0)
MONO ABS: 0.5 10*3/uL (ref 0.1–1.0)
Monocytes Relative: 18 %
Neutro Abs: 0.9 10*3/uL — ABNORMAL LOW (ref 1.7–7.7)
Neutrophils Relative %: 33 %
Platelets: 86 10*3/uL — ABNORMAL LOW (ref 150–400)
RBC: 2.73 MIL/uL — ABNORMAL LOW (ref 3.87–5.11)
RDW: 18.6 % — ABNORMAL HIGH (ref 11.5–15.5)
WBC: 2.7 10*3/uL — ABNORMAL LOW (ref 4.0–10.5)
nRBC: 0 % (ref 0.0–0.2)

## 2018-12-28 LAB — SAMPLE TO BLOOD BANK

## 2018-12-28 LAB — COMPREHENSIVE METABOLIC PANEL
ALT: 16 U/L (ref 0–44)
AST: 18 U/L (ref 15–41)
Albumin: 3.5 g/dL (ref 3.5–5.0)
Alkaline Phosphatase: 84 U/L (ref 38–126)
Anion gap: 12 (ref 5–15)
BUN: 10 mg/dL (ref 8–23)
CO2: 24 mmol/L (ref 22–32)
Calcium: 8.8 mg/dL — ABNORMAL LOW (ref 8.9–10.3)
Chloride: 99 mmol/L (ref 98–111)
Creatinine, Ser: 0.49 mg/dL (ref 0.44–1.00)
GFR calc Af Amer: >60 mL/min (ref >60)
Glucose, Bld: 104 mg/dL — ABNORMAL HIGH (ref 70–99)
Potassium: 3.5 mmol/L (ref 3.5–5.1)
Sodium: 135 mmol/L (ref 135–145)
Total Bilirubin: 0.6 mg/dL (ref 0.3–1.2)
Total Protein: 6.5 g/dL (ref 6.5–8.1)

## 2018-12-28 NOTE — Therapy (Signed)
[facility] [address] [place], [place], [place], [address] Phone: [phone]   Fax:  [phone]  Occupational Therapy Screen  Patient Details  Name: Kristi [name] MRN: [id number] Date of Birth: 1946-08-14 No data recorded  Encounter Date: 12/28/2018    Past Medical History:  Diagnosis Date  . Acute MI, inferoposterior wall ([place]) 09/30/2014  . Claustrophobia   . Coronary artery disease   . Hypercholesteremia   . Hypertension   . MI, old   . 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 APPENDECTOMY     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  . BLADDER NECK SUSPENSION    . CHOLECYSTECTOMY N/A 08/14/2018   Procedure: LAPAROSCOPIC CHOLECYSTECTOMY;  Surgeon: Jules Husbands, MD;  Location: [facility] ORS;  Service: General;  Laterality: N/A;  . CORONARY STENT PLACEMENT    . ENDOBRONCHIAL ULTRASOUND N/A 10/21/2018   Procedure: ENDOBRONCHIAL ULTRASOUND;  Surgeon: Laverle Hobby, MD;  Location: [facility] ORS;  Service: Pulmonary;  Laterality: N/A;  . HERNIA REPAIR    . IR FLUORO GUIDE CV LINE RIGHT  12/19/2018  . LEFT HEART CATHETERIZATION WITH CORONARY ANGIOGRAM N/A 09/29/2014   Procedure: LEFT HEART CATHETERIZATION WITH CORONARY ANGIOGRAM;  Surgeon: Clent Demark, MD;  Location: [facility] CATH LAB;  Service: Cardiovascular;  Laterality: N/A;  . PORTACATH PLACEMENT Right 10/28/2018   Procedure: INSERTION PORT-A-CATH;  Surgeon: Jules Husbands, MD;  Location: [facility] ORS;  Service: General;  Laterality: Right;    There were no vitals filed for this visit.  Subjective Assessment - 12/28/18 1109    Subjective   I am just short of breath the last few days- but I try and stay active at home and not sitting to much - did not had any falls since I have seen you        Patient seen Dr Tasia Catchings today because of complaints of "hacky cough." Has "trouble catching her breath" at times when she  stands up,  and lightheadedness.  Per nursing prior to screen - Dr Tasia Catchings ordered blood transfusion. Not to do to much.  Pt was worried the  last time she was seen  about balance and her risk for falls.   Did modify Berg balance test - did not do standing for 10 sec on one leg , but did all the rest.  Pt able to do all other steps of test -and score low fall risk at this time   Pt do not need to follow up with me -  But she or husband can contact me anytime  if is she needs to see me                                    Patient will benefit from skilled therapeutic intervention in order to improve the following deficits and impairments:     Visit Diagnosis: Weakness    Problem List Patient Active Problem List   Diagnosis Date Noted  . Encounter for antineoplastic chemotherapy 11/02/2018  . Cough 10/28/2018  . Dysphagia 10/28/2018  . Decrease in appetite 10/28/2018  . Goals of care, counseling/discussion 10/28/2018  . Small cell lung cancer ([place]) 10/27/2018  . Grade I diastolic dysfunction 25/36/6440  . Localized edema 08/29/2018  . Cholecystitis 08/13/2018  . COPD with exacerbation ([place]) 08/11/2018  . Osteopenia determined by x-ray  05/19/2018  . Prediabetes 02/08/2018  . Hx of fracture of radius 08/18/2016  . Vitamin D deficiency 01/07/2016  . Hyperlipidemia 01/02/2016  . Hypertension 01/02/2016  . Chronic pain 01/02/2016  . Obesity (BMI 30.0-34.9) 01/02/2016  . Coronary artery disease with stable angina pectoris ([place]) 01/02/2016  . IBS (irritable bowel syndrome) 01/02/2016  . FH: colon cancer 01/02/2016  . DDD (degenerative disc disease), cervical 01/02/2016  . Primary osteoarthritis of left knee 01/02/2016  . Tobacco use disorder 01/02/2016    Rosalyn Gess OTR/L,CLT 12/28/2018, 11:10 AM  [facility] [address] [place], [place], [place], [address] Phone: [phone]   Fax:   [phone]  Name: Kristi [name] MRN: [id number] Date of Birth: 06-04-1946

## 2018-12-28 NOTE — Progress Notes (Signed)
Hematology/Oncology Consult note [facility] [phone] [phone]   Patient Care Team: Glean Hess, MD as PCP - General (Internal Medicine) Charolette Forward, MD as Consulting Physician (Cardiology) Telford Nab, RN as Registered Nurse  REFERRING PROVIDER: Dr. 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 Patient recently was referred to see pulmonology Dr. Felicie Morn for persistent cough and dyspnea.  Patient reports coughing up clear sputum for about 2 months.  No hemoptysis.  She also had weight loss. Former smoker, quitting smoking 2 weeks ago.  39-pack-year smoking history  09/30/2018 chest x-ray showed new market volume loss in the left chest area of the left mainstem bronchus worrisome for centrally obstructing mass. 10/17/2018 chest x-ray showed volume loss worsened in the left hemithorax worsening patchy consolidation in the mid to lower left lung with minimal residual aeration and up in the left lung mass not excluded.  Possible small left pleural effusion. 10/31 2019 CT chest with contrast showed large mediastinal mass involving both hilar, left greater than right, consistent with lung carcinoma The mass causes narrowing of the left mainstem bronchus with resultant volume loss on the left and a mediastinal shift to the left.  Moderate size left pleural effusion. 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  Today patient was accompanied by husband and daughter to clinic to discuss diagnosis and management plan. She reports feeling tired feels shortness of breath with exertion.  Persistent coughing, productive with clear sputum. Also  reports difficulty swallowing, feels food sticking in her food pipe. MRI brain was obtained which was negative for intracranial metastatic disease.  INTERVAL HISTORY Kristi [name] is a 73 y.o. female who has above history reviewed by me today presents for acute visit for evaluation of shortness of breath which worsened 3 days ago, also mild cough.  She has been on chemotherapy treatment for management of extensive small cell lung cancer. Status post 3 cycles of carboplatin, etoposide, Tecentriq. Denies any nausea, vomiting, abdominal pain, diarrhea.  Denies any fever or chills. She used to have a chronic cough which completely resolved after 2 cycles of chemotherapy.  Noticed that she started to have a dry cough again, no phlegm.  No  Review of Systems  Constitutional: Positive for fatigue. Negative for appetite change, chills and fever.  HENT:   Negative for hearing loss and voice change.   Eyes: Negative for eye problems.  Respiratory: Positive for cough and shortness of breath. Negative for chest tightness.   Cardiovascular: Negative for chest pain.  Gastrointestinal: Negative for abdominal distention, abdominal pain and blood in stool.  Endocrine: Negative for hot flashes.  Genitourinary: Negative for difficulty urinating and frequency.   Musculoskeletal: Negative for arthralgias.  Skin: Negative for itching and rash.  Neurological: Negative for extremity weakness.  Hematological: Negative for adenopathy.  Psychiatric/Behavioral: Negative for confusion.     MEDICAL HISTORY:  Past Medical History:  Diagnosis Date  . Acute MI, inferoposterior wall ([place]) 09/30/2014  . Claustrophobia   . Coronary artery disease   . Hypercholesteremia   . Hypertension   . MI, old   . 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 APPENDECTOMY     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  . BLADDER NECK SUSPENSION    .  CHOLECYSTECTOMY N/A 08/14/2018   Procedure: LAPAROSCOPIC CHOLECYSTECTOMY;  Surgeon: Jules Husbands, MD;  Location: [facility] ORS;  Service: General;  Laterality: N/A;  . CORONARY STENT PLACEMENT    . ENDOBRONCHIAL ULTRASOUND N/A 10/21/2018   Procedure: ENDOBRONCHIAL ULTRASOUND;  Surgeon: Laverle Hobby, MD;  Location: [facility] ORS;  Service: Pulmonary;  Laterality: N/A;  . HERNIA REPAIR    . IR FLUORO GUIDE CV LINE RIGHT  12/19/2018  . LEFT HEART CATHETERIZATION WITH CORONARY ANGIOGRAM N/A 09/29/2014   Procedure: LEFT HEART CATHETERIZATION WITH CORONARY ANGIOGRAM;  Surgeon: Clent Demark, MD;  Location: [facility] CATH LAB;  Service: Cardiovascular;  Laterality: N/A;  . PORTACATH PLACEMENT Right 10/28/2018   Procedure: INSERTION PORT-A-CATH;  Surgeon: Jules Husbands, MD;  Location: [facility] ORS;  Service: General;  Laterality: Right;    SOCIAL HISTORY: Social History   Socioeconomic History  . Marital status: Married    Spouse name: Not on file  . Number of children: Not on file  . Years of education: Not on file  . Highest education level: Not on file  Occupational History  . Not on file  Social Needs  . Financial resource strain: Not on file  . Food insecurity:    Worry: Not on file    Inability: Not on file  . Transportation needs:    Medical: Not on file    Non-medical: Not on file  Tobacco Use  . Smoking status: Former Smoker    Packs/day: 1.00    Years: 39.00    Pack years: 39.00    Types: Cigarettes    Start date: 12/21/1978    Last attempt to quit: 10/16/2018    Years since quitting: 0.2  . Smokeless tobacco: Never Used  Substance and Sexual Activity  . Alcohol use: Yes    Alcohol/week: 0.0 standard drinks    Comment: occassional - approx 1 every 2 weeks   . Drug use: No  . Sexual activity: Yes    Birth control/protection: None  Lifestyle  . Physical activity:    Days per week: Not on file    Minutes per session: Not on file  . Stress: Not on file  Relationships  .  Social connections:    Talks on phone: Not on file    Gets together: Not on file    Attends religious service: Not on file    Active member of club or organization: Not on file    Attends meetings of clubs or organizations: Not on file    Relationship status: Not on file  . Intimate partner violence:    Fear of current or ex partner: Not on file    Emotionally abused: Not on file    Physically abused: Not on file    Forced sexual activity: Not on file  Other Topics Concern  . Not on file  Social History Narrative  . Not on file    FAMILY HISTORY: Family History  Problem Relation Age of Onset  . Alzheimer's disease Mother   . Colon cancer Father   . Breast cancer Neg Hx     ALLERGIES:  has No Known Allergies.  MEDICATIONS:  Current Outpatient Medications  Medication Sig Dispense Refill  . albuterol (PROAIR HFA) 108 (90 Base) MCG/ACT inhaler Inhale 2 puffs into the lungs every 6 (six) hours as needed for wheezing or shortness of breath. [place]  g 2  . AMBULATORY NON FORMULARY MEDICATION Medication Name: Please provide patient with Nebulizer. DX: J44.9 1 each 0  . aspirin EC 81 MG tablet Take 81 mg by mouth daily.    [name] Kitchen atorvastatin (LIPITOR) 80 MG tablet Take 0.5 tablets (40 mg total) by mouth daily at 6 PM. (Patient taking differently: Take 80 mg by mouth at bedtime. ) 30 tablet 3  . B Complex Vitamins (VITAMIN B COMPLEX PO) Take 1 tablet by mouth daily.     . benzonatate (TESSALON PERLES) 100 MG capsule Take 2 capsules (200 mg total) by mouth 3 (three) times daily. 90 capsule 2  . CALCIUM PO Take 1 tablet by mouth daily.     . cetirizine (ZYRTEC) 10 MG tablet Take 10 mg by mouth daily.    . chlorhexidine (PERIDEX) 0.12 % solution Use as directed 15 mLs in the mouth or throat 2 (two) times daily. 473 mL 0  . Cholecalciferol (VITAMIN D) 2000 units CAPS Take 1 capsule (2,000 Units total) by mouth daily. 30 capsule   . dexamethasone (DECADRON) 4 MG tablet Take 8mg  daily for 2 days  after chemtherapy treatments. 30 tablet 1  . docusate sodium (COLACE) 100 MG capsule Take 100 mg by mouth daily.    [name] Kitchen dronabinol (MARINOL) 5 MG capsule Take 1 capsule (5 mg total) by mouth 2 (two) times daily before a meal. 60 capsule 0  . Fluticasone-Umeclidin-Vilant (TRELEGY ELLIPTA) 100-62.5-25 MCG/INH AEPB Inhale 1 puff into the lungs daily. 1 each 12  . HYDROcodone-acetaminophen (NORCO/VICODIN) 5-325 MG tablet Take 1 tablet by mouth every 12 (twelve) hours as needed for moderate pain. 30 tablet 0  . ipratropium-albuterol (DUONEB) 0.5-2.5 (3) MG/3ML SOLN Take 3 mLs by nebulization 3 (three) times daily. DX. J44.9 360 mL 1  . lidocaine-prilocaine (EMLA) cream Apply to affected area once 30 g 3  . metoprolol tartrate (LOPRESSOR) 25 MG tablet Take 12.5 mg by mouth 2 (two) times daily.    . nitroGLYCERIN (NITROSTAT) 0.4 MG SL tablet Place 1 tablet (0.4 mg total) under the tongue every 5 (five) minutes x 3 doses as needed for chest pain. 25 tablet 12  . omeprazole (PRILOSEC) 40 MG capsule TAKE 1 CAPSULE(40 MG) BY MOUTH DAILY 90 capsule 0  . ondansetron (ZOFRAN) 8 MG tablet Take 1 tablet (8 mg total) by mouth 2 (two) times daily as needed for refractory nausea / vomiting. Start on day 3 after carboplatin chemo. 30 tablet 1  . potassium chloride SA (K-DUR,KLOR-CON) 20 MEQ tablet TAKE 1 TABLET(20 MEQ) BY MOUTH DAILY 90 tablet 1  . prochlorperazine (COMPAZINE) 10 MG tablet TAKE 1 TABLET BY MOUTH EVERY 6 HOURS AS NEEDED FOR NAUSEA OR VOMITING 337 tablet 1  . sertraline (ZOLOFT) 50 MG tablet Take 1 tablet (50 mg total) by mouth daily. 30 tablet 1  . sucralfate (CARAFATE) 1 g tablet TAKE 1 TABLET BY MOUTH FOUR TIMES DAILY, WITH MEALS& AT BEDTIME 368 tablet 0  . ALPRAZolam (XANAX) 0.5 MG tablet Take medication with you to MRI. Take 1 tablet prior to MRI. (Patient not taking: Reported on 12/28/2018) 5 tablet 0   No current facility-administered medications for this visit.      PHYSICAL EXAMINATION: ECOG  PERFORMANCE STATUS: 1 - Symptomatic but completely ambulatory Vitals:   12/28/18 1011  BP: 104/73  Pulse: 94  Resp: 18  Temp: (!) 97.4 F (36.3 C)  SpO2: 98%   Filed Weights   12/28/18 1011  Weight: 138 lb 4.8 oz (62.7  kg)   Physical Exam  Constitutional: She is oriented to person, place, and time. No distress.  HENT:  Head: Normocephalic and atraumatic.  Mouth/Throat: No oropharyngeal exudate.  Eyes: Pupils are equal, round, and reactive to light. EOM are normal. No scleral icterus.  Neck: Normal range of motion. Neck supple.  Cardiovascular: Normal rate and regular rhythm.  No murmur heard. Pulmonary/Chest: Effort normal. No respiratory distress. She has no wheezes. She has no rales. She exhibits no tenderness.  Abdominal: Soft. She exhibits no distension. There is no abdominal tenderness.  Musculoskeletal: Normal range of motion.        General: No edema.  Neurological: She is alert and oriented to person, place, and time. No cranial nerve deficit. She exhibits normal muscle tone. Coordination normal.  Skin: Skin is warm and dry. She is not diaphoretic. No erythema.  Psychiatric: Affect normal.       LABORATORY DATA:  I have reviewed the data as listed Lab Results  Component Value Date   WBC 2.7 (L) 12/28/2018   HGB 8.1 (L) 12/28/2018   HCT 24.3 (L) 12/28/2018   MCV 89.0 12/28/2018   PLT 86 (L) 12/28/2018   Recent Labs    08/12/18 1438  12/19/18 0800 12/26/18 1015 12/28/18 0950  NA 131*   < > 139 137 135  K 3.7   < > 3.6 3.7 3.5  CL 97*   < > 98 98 99  CO2 25   < > 32 26 24  GLUCOSE 122*   < > 107* 102* 104*  BUN 9   < > 7* 16 10  CREATININE 0.74   < > 0.71 0.52 0.49  CALCIUM 9.1   < > 8.6* 8.6* 8.8*  GFRNONAA >60   < > >60 >60 >60  GFRAA >60   < > >60 >60 >60  PROT 8.0   < > 6.3* 6.6 6.5  ALBUMIN 4.3   < > 2.9* 3.5 3.5  AST 33   < > 17 21 18   ALT 26   < > 9 20 16   ALKPHOS 81   < > 95 88 84  BILITOT 0.6   < > 0.5 0.8 0.6  BILIDIR 0.2  --   --   --    --   IBILI 0.4  --   --   --   --    < > = values in this interval not displayed.   Iron/TIBC/Ferritin/ %Sat No results found for: IRON, TIBC, FERRITIN, IRONPCTSAT   RADIOGRAPHIC STUDIES: I have personally reviewed the radiological images as listed and agreed with the findings in the report. . 10/20/2018 CT chest w contrast . Large mediastinal mass involving both hila left much greater than right consistent with lung carcinoma possibly small cell lung carcinoma. This mass causes narrowing of the left mainstem bronchus with resultant volume loss on the left and mediastinal shift to the Left. 2. Moderate size left pleural effusion. 3. Coronary artery calcifications and moderate thoracic aortic atherosclerosis.  PET 10/26/2018 . Large left lung mass is markedly hypermetabolic in the confluent lymphadenopathy in the mediastinum and in bilateral hilar regions also shows marked hypermetabolism. 2. Hypermetabolic metastatic lymphadenopathy in the right neck and supraclavicular region. 3. Hypermetabolic lymphadenopathy in the hepato duodenal ligament consistent with metastatic disease. 4. Large hypermetabolic lesion posterior right acetabulum without correlate of CT finding. Features highly suspicious for bony metastatic disease.  MRI brain 10/28/2018 1. No metastatic disease or acute intracranial abnormality identified. 2. 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  3. Moderately advanced but nonspecific cerebral white matter signal changes, most commonly due to chronic small vessel disease. Lesser signal changes in the deep gray matter and pons may also be small vessel related.  ASSESSMENT & PLAN:  1. SOB (shortness of breath)   2. Symptomatic anemia   3. Other fatigue   4. Cough   5. Small cell lung cancer ([place])   6. Thrombocytopenia ([place])    #Extensive small cell lung cancer Recent CT chest abdomen pelvis was independent reviewed by me and discussed with  patient. Received 3 cycles of chemotherapy.  Overall tolerated well. Counts are reviewed and discussed with patient.  Stable.  #Anemia, hemoglobin dropped to 8.1, patient symptomatic with lightheadedness and fatigue, shortness of breath.  History of MI. Recommend type and screen, transfuse 1 unit of irradiated PRBC for symptomatic anemia.  Patient agrees with the plan.  #Shortness of breath may be secondary to symptomatic anemia.  Advised patient to update me if shortness of breath get worse after transfusion.  #Thrombocytopenia, due to chemotherapy.  No bleeding.  Patient is on aspirin 81 mg.  Continue.  Advised patient to call office if she experiences active bleeding.  She voices understanding.  #Dry cough, etiology unknown.  Chest x-ray was independently reviewed and discussed with patient.  No acute process. Continue monitor. We spent sufficient time to discuss many aspect of care, questions were answered to patient's satisfaction. The patient knows to call the clinic with any problems questions or concerns.  Return of visit: Keep her original appointment      Earlie Server, MD, PhD Hematology Oncology [facility] at [facility] Pager- [phone] 12/28/2018

## 2018-12-28 NOTE — Progress Notes (Signed)
Patient here with complaints of "hacky cough." Has "trouble catching her breath" at times when she stands up,  and lightheadedness.

## 2018-12-29 ENCOUNTER — Inpatient Hospital Stay: Payer: Medicare Other

## 2018-12-29 DIAGNOSIS — D649 Anemia, unspecified: Secondary | ICD-10-CM

## 2018-12-29 DIAGNOSIS — Z5112 Encounter for antineoplastic immunotherapy: Secondary | ICD-10-CM | POA: Diagnosis not present

## 2018-12-29 DIAGNOSIS — R5383 Other fatigue: Secondary | ICD-10-CM

## 2018-12-29 DIAGNOSIS — D6959 Other secondary thrombocytopenia: Secondary | ICD-10-CM | POA: Diagnosis not present

## 2018-12-29 DIAGNOSIS — D6481 Anemia due to antineoplastic chemotherapy: Secondary | ICD-10-CM | POA: Diagnosis not present

## 2018-12-29 DIAGNOSIS — Z5111 Encounter for antineoplastic chemotherapy: Secondary | ICD-10-CM | POA: Diagnosis not present

## 2018-12-29 DIAGNOSIS — C3402 Malignant neoplasm of left main bronchus: Secondary | ICD-10-CM | POA: Diagnosis not present

## 2018-12-29 LAB — PREPARE RBC (CROSSMATCH)

## 2018-12-29 MED ORDER — DIPHENHYDRAMINE HCL 25 MG PO CAPS
25.0000 mg | ORAL_CAPSULE | Freq: Once | ORAL | Status: AC
  Administered 2018-12-29: 25 mg via ORAL
  Filled 2018-12-29: qty 1

## 2018-12-29 MED ORDER — SODIUM CHLORIDE 0.9% IV SOLUTION
250.0000 mL | Freq: Once | INTRAVENOUS | Status: AC
  Administered 2018-12-29: 250 mL via INTRAVENOUS
  Filled 2018-12-29: qty 250

## 2018-12-29 MED ORDER — SODIUM CHLORIDE 0.9% FLUSH
10.0000 mL | INTRAVENOUS | Status: AC | PRN
  Administered 2018-12-29: 10 mL
  Filled 2018-12-29: qty 10

## 2018-12-29 MED ORDER — ACETAMINOPHEN 325 MG PO TABS
650.0000 mg | ORAL_TABLET | Freq: Once | ORAL | Status: AC
  Administered 2018-12-29: 650 mg via ORAL
  Filled 2018-12-29: qty 2

## 2018-12-29 MED ORDER — HEPARIN SOD (PORK) LOCK FLUSH 100 UNIT/ML IV SOLN
500.0000 [IU] | Freq: Every day | INTRAVENOUS | Status: AC | PRN
  Administered 2018-12-29: 500 [IU]
  Filled 2018-12-29: qty 5

## 2018-12-30 LAB — BPAM RBC
Blood Product Expiration Date: 202001182359
ISSUE DATE / TIME: 202001090933
Unit Type and Rh: 6200

## 2018-12-30 LAB — TYPE AND SCREEN
ABO/RH(D): A POS
Antibody Screen: NEGATIVE
Unit division: 0

## 2019-01-01 ENCOUNTER — Other Ambulatory Visit (INDEPENDENT_AMBULATORY_CARE_PROVIDER_SITE_OTHER): Payer: Self-pay | Admitting: Nurse Practitioner

## 2019-01-01 MED ORDER — DEXTROSE 5 % IV SOLN
2.0000 g | Freq: Once | INTRAVENOUS | Status: AC
  Administered 2019-01-02: 2 g via INTRAVENOUS
  Filled 2019-01-01: qty 20

## 2019-01-02 ENCOUNTER — Other Ambulatory Visit: Payer: Self-pay

## 2019-01-02 ENCOUNTER — Encounter
Admission: RE | Disposition: A | Discharge status: Home / Self Care | Payer: Self-pay | Source: Home / Self Care | Attending: Vascular Surgery

## 2019-01-02 ENCOUNTER — Ambulatory Visit
Admission: RE | Admit: 2019-01-02 | Discharge: 2019-01-02 | Disposition: A | Discharge status: Home / Self Care | Payer: Medicare Other | Attending: Vascular Surgery | Admitting: Vascular Surgery

## 2019-01-02 ENCOUNTER — Inpatient Hospital Stay: Payer: Medicare Other

## 2019-01-02 DIAGNOSIS — Z5112 Encounter for antineoplastic immunotherapy: Secondary | ICD-10-CM | POA: Diagnosis not present

## 2019-01-02 DIAGNOSIS — C3402 Malignant neoplasm of left main bronchus: Secondary | ICD-10-CM | POA: Diagnosis not present

## 2019-01-02 DIAGNOSIS — D649 Anemia, unspecified: Secondary | ICD-10-CM | POA: Diagnosis not present

## 2019-01-02 DIAGNOSIS — R5383 Other fatigue: Secondary | ICD-10-CM

## 2019-01-02 DIAGNOSIS — C3480 Malignant neoplasm of overlapping sites of unspecified bronchus and lung: Secondary | ICD-10-CM | POA: Diagnosis not present

## 2019-01-02 DIAGNOSIS — Z5111 Encounter for antineoplastic chemotherapy: Secondary | ICD-10-CM | POA: Diagnosis not present

## 2019-01-02 DIAGNOSIS — D6481 Anemia due to antineoplastic chemotherapy: Secondary | ICD-10-CM | POA: Diagnosis not present

## 2019-01-02 DIAGNOSIS — I871 Compression of vein: Secondary | ICD-10-CM | POA: Diagnosis not present

## 2019-01-02 DIAGNOSIS — T82898A Other specified complication of vascular prosthetic devices, implants and grafts, initial encounter: Secondary | ICD-10-CM | POA: Diagnosis not present

## 2019-01-02 DIAGNOSIS — D6959 Other secondary thrombocytopenia: Secondary | ICD-10-CM | POA: Diagnosis not present

## 2019-01-02 HISTORY — PX: PORTA CATH INSERTION: CATH118285

## 2019-01-02 LAB — CBC WITH DIFFERENTIAL/PLATELET
Abs Immature Granulocytes: 0.13 10*3/uL — ABNORMAL HIGH (ref 0.00–0.07)
Basophils Absolute: 0.1 10*3/uL (ref 0.0–0.1)
Basophils Relative: 1 %
EOS PCT: 0 %
Eosinophils Absolute: 0 10*3/uL (ref 0.0–0.5)
HCT: 26.7 % — ABNORMAL LOW (ref 36.0–46.0)
Hemoglobin: 8.9 g/dL — ABNORMAL LOW (ref 12.0–15.0)
Immature Granulocytes: 2 %
Lymphocytes Relative: 22 %
Lymphs Abs: 1.4 10*3/uL (ref 0.7–4.0)
MCH: 29.9 pg (ref 26.0–34.0)
MCHC: 33.3 g/dL (ref 30.0–36.0)
MCV: 89.6 fL (ref 80.0–100.0)
Monocytes Absolute: 0.6 10*3/uL (ref 0.1–1.0)
Monocytes Relative: 9 %
Neutro Abs: 4.2 10*3/uL (ref 1.7–7.7)
Neutrophils Relative %: 66 %
Platelets: 64 10*3/uL — ABNORMAL LOW (ref 150–400)
RBC: 2.98 MIL/uL — ABNORMAL LOW (ref 3.87–5.11)
RDW: 19.9 % — ABNORMAL HIGH (ref 11.5–15.5)
WBC: 6.4 10*3/uL (ref 4.0–10.5)
nRBC: 0 % (ref 0.0–0.2)

## 2019-01-02 LAB — COMPREHENSIVE METABOLIC PANEL
ALK PHOS: 94 U/L (ref 38–126)
ALT: 12 U/L (ref 0–44)
ANION GAP: 8 (ref 5–15)
AST: 12 U/L — ABNORMAL LOW (ref 15–41)
Albumin: 3.2 g/dL — ABNORMAL LOW (ref 3.5–5.0)
BUN: 7 mg/dL — ABNORMAL LOW (ref 8–23)
CO2: 26 mmol/L (ref 22–32)
CREATININE: 0.54 mg/dL (ref 0.44–1.00)
Calcium: 8.9 mg/dL (ref 8.9–10.3)
Chloride: 107 mmol/L (ref 98–111)
GFR calc Af Amer: >60 mL/min (ref >60)
GFR calc non Af Amer: >60 mL/min (ref >60)
Glucose, Bld: 103 mg/dL — ABNORMAL HIGH (ref 70–99)
Potassium: 3.5 mmol/L (ref 3.5–5.1)
SODIUM: 141 mmol/L (ref 135–145)
Total Bilirubin: 0.4 mg/dL (ref 0.3–1.2)
Total Protein: 6.5 g/dL (ref 6.5–8.1)

## 2019-01-02 LAB — TSH: TSH: 0.844 u[IU]/mL (ref 0.350–4.500)

## 2019-01-02 SURGERY — PORTA CATH INSERTION
Anesthesia: Moderate Sedation

## 2019-01-02 MED ORDER — DIPHENHYDRAMINE HCL 50 MG/ML IJ SOLN: 50.0000 mg | Freq: Once | INTRAMUSCULAR | Status: DC | PRN

## 2019-01-02 MED ORDER — SODIUM CHLORIDE 0.9 % IV SOLN
INTRAVENOUS | Status: AC | PRN
  Administered 2019-01-02: 250 mL via INTRAVENOUS

## 2019-01-02 MED ORDER — MIDAZOLAM HCL 2 MG/2ML IJ SOLN
INTRAMUSCULAR | Status: DC | PRN
  Administered 2019-01-02: 1 mg via INTRAVENOUS
  Administered 2019-01-02: 2 mg via INTRAVENOUS

## 2019-01-02 MED ORDER — FENTANYL CITRATE (PF) 100 MCG/2ML IJ SOLN
INTRAMUSCULAR | Status: AC
  Filled 2019-01-02: qty 2

## 2019-01-02 MED ORDER — HYDROMORPHONE HCL 1 MG/ML IJ SOLN: 1.0000 mg | Freq: Once | INTRAMUSCULAR | Status: DC | PRN

## 2019-01-02 MED ORDER — ONDANSETRON HCL 4 MG/2ML IJ SOLN: 4.0000 mg | Freq: Four times a day (QID) | INTRAMUSCULAR | Status: DC | PRN

## 2019-01-02 MED ORDER — MIDAZOLAM HCL 5 MG/5ML IJ SOLN
INTRAMUSCULAR | Status: AC
  Filled 2019-01-02: qty 5

## 2019-01-02 MED ORDER — SODIUM CHLORIDE 0.9 % IV SOLN
INTRAVENOUS | Status: DC
  Administered 2019-01-02: 11:00:00 via INTRAVENOUS

## 2019-01-02 MED ORDER — LIDOCAINE-EPINEPHRINE (PF) 1 %-1:200000 IJ SOLN
INTRAMUSCULAR | Status: AC
  Filled 2019-01-02: qty 10

## 2019-01-02 MED ORDER — IOPAMIDOL (ISOVUE-300) INJECTION 61%
INTRAVENOUS | Status: DC | PRN
  Administered 2019-01-02: 20 mL via INTRAVENOUS

## 2019-01-02 MED ORDER — FENTANYL CITRATE (PF) 100 MCG/2ML IJ SOLN
INTRAMUSCULAR | Status: DC | PRN
  Administered 2019-01-02: 25 ug via INTRAVENOUS
  Administered 2019-01-02: 50 ug via INTRAVENOUS

## 2019-01-02 MED ORDER — SODIUM CHLORIDE 0.9 % IV SOLN
Freq: Once | INTRAVENOUS | Status: DC
  Filled 2019-01-02: qty 2

## 2019-01-02 SURGICAL SUPPLY — 16 items
BALLN LUTONIX DCB 7X60X130 (BALLOONS) ×3
BALLN ULTRVRSE 10X60X75 (BALLOONS) ×3
BALLOON LUTONIX DCB 7X60X130 (BALLOONS) ×1 IMPLANT
BALLOON ULTRVRSE 10X60X75 (BALLOONS) ×1 IMPLANT
CATH BEACON 5 .035 40 KMP TP (CATHETERS) ×1 IMPLANT
CATH BEACON 5 .038 40 KMP TP (CATHETERS) ×2
DEVICE PRESTO INFLATION (MISCELLANEOUS) ×3 IMPLANT
GLIDEWIRE ADV .035X180CM (WIRE) ×3 IMPLANT
KIT PORT POWER 8FR ISP CVUE (Port) ×3 IMPLANT
PACK ANGIOGRAPHY (CUSTOM PROCEDURE TRAY) ×3 IMPLANT
PAD GROUND ADULT SPLIT (MISCELLANEOUS) ×3 IMPLANT
PENCIL ELECTRO HAND CTR (MISCELLANEOUS) ×3 IMPLANT
SHEATH BRITE TIP 6FRX11 (SHEATH) ×3 IMPLANT
SUT MNCRL AB 4-0 PS2 18 (SUTURE) ×3 IMPLANT
SUT PROLENE 0 CT 1 30 (SUTURE) ×3 IMPLANT
SUT VICRYL+ 3-0 36IN CT-1 (SUTURE) ×3 IMPLANT

## 2019-01-02 NOTE — H&P (Signed)
[facility] History & Physical Update  The patient was interviewed and re-examined.  The patient's previous History and Physical has been reviewed and is unchanged.  There is no change in the plan of care. We plan to proceed with the scheduled procedure.  Leotis Pain, MD  01/02/2019, 11:07 AM

## 2019-01-02 NOTE — Progress Notes (Signed)
Nutrition Follow-up:  Patient with lung cancer followed by Dr Tasia Catchings.  Patient receiving chemotherapy.  Followed by palliative care as well.   Was planning on meeting with patient following labs but patient needing to leave to have port placed.  Spoke with patient briefly while waiting for labs to be drawn.  Patient reports appetite is good and not having issues with swallowing foods.  Reports continues to drink carnation instant breakfast occasionally.  Reports yesterday ate toast with egg for breakfast then had summer sausage with cheese and crackers.  Could not remember lunch and dinner was bratwurst, cucumbers.    Noted issues with constipation and taking miralax and senna.  Did not get to address this with patient as called back for labs.  Medications: reviewed  Labs: reviewed  Anthropometrics:   Weight 138 lb 4.8 oz noted on 1/8 relatively stable from 139 lb 9 oz on 12/30.     NUTRITION DIAGNOSIS: Inadequate oral intake stable   INTERVENTION:  Recommend patient add carnation instant breakfast daily to increase calories and protein.  Coupons given to patient.  Encouraged patient to continue to focus on high calorie, high protein foods    MONITORING, EVALUATION, GOAL: weight trends, intake   NEXT VISIT: to be determined with patient schedule.  RD not in clinic next week when patient has treatment  Kristi [name], [place], [place] Registered Dietitian [phone] (pager)

## 2019-01-02 NOTE — Op Note (Signed)
[facility]       Operative Note  Date: 01/02/2019  Preoperative diagnosis:  1. Non-functional port a cath, central venous stenosis  Postoperative diagnosis:  Same as above  Procedures: #1. Removal of right jugular permcath #2. Fluoroscopic guidance for placement of new catheter. #3. Right jugular venogram and SVC gram #4. PTA of right jugular vein and innominate vein with 7 mm diameter drug coated balloon and PTA of right innominate vein and SVC with 10 mm balloon #5. Placement of new port in right jugular vein with fluoroscopic guidance.  Surgeon: Leotis Pain, MD.   Anesthesia: Local with moderate conscious sedation for approximately 30  minutes using 3 mg of Versed and 75 mcg of Fentanyl  Fluoroscopy time: 6.2 minutes  Contrast used: 20  Estimated blood loss: 5 cc  Indication for the procedure:  The patient is a 73 y.o.female with a non-functional port and a previous study suggesting central venous stenosis.  The patient needs a Port-A-Cath for durable venous access, chemotherapy, lab draws, and CT scans. We are asked to place this. Risks and benefits were discussed and informed consent was obtained.  Description of procedure: The patient was brought to the vascular and interventional radiology suite.  Moderate conscious sedation was administered throughout the procedure during a face to face encounter with the patient with my supervision of the RN administering medicines and monitoring the patient's vital signs, pulse oximetry, telemetry and mental status throughout from the start of the procedure until the patient was taken to the recovery room. The right neck chest and shoulder were sterilely prepped and draped, and a sterile surgical field was created.  Her previous transverse incision below the clavicle was reopened.  The port was then dissected out and removed from the pocket and the Prolene sutures removed.  The catheter was then clamped a couple of  centimeters of the entry into the port and transected.  The port was then removed and taken off the field.  I then used an advantage wire and rewired the existing catheter and remove the catheter.  A 6 French sheath was then placed into the right jugular vein over the wire and imaging was performed.  This showed an occlusion of the right innominate vein with significant high-grade stenosis of greater than 80% in the right jugular vein itself.  The superior vena cava was seen and appeared to be widely patent.  Using a Kumpe catheter and the advantage wire, I was able to cross the occlusion and get the Kumpe catheter into the superior vena cava and right atrium with intraluminal flow confirmed.  The wire was then parked down into the IVC and the catheter removed.  I then performed angioplasty of the area with a 7 mm diameter by 8 cm length Lutonix drug-coated angioplasty balloon in the right jugular vein and innominate vein.  This was inflated to 12 atm for 1 minute.  Imaging showed the jugular vein to be improved but there is still a high-grade stenosis in the innominate vein and so upsized to a 10 mm diameter by 6 cm length balloon and inflated this to 10 atm for 1 minute in the innominate vein and superior vena cava.  Completion imaging still showed a residual stenosis approximating 50%, but there was markedly improved flow and at this point a new catheter could easily be placed.  The sheath was removed and the new catheter for a new port was parked in the right atrium.  This came out the tunnel and the subclavicular pocket at this point.  A new port was then connected to the catheter after cutting it to an appropriate length using fluoroscopic guidance and parked into the pocket.  The catheter tip terminated just into the right atrium at this point.  The pocket was then irrigated with antibiotic impregnated saline and the wound was closed with a running 3-0 Vicryl and a 4-0 Monocryl. The access incision was closed  with a single 4-0 Monocryl. The Huber needle was used to withdraw blood and flush the port with heparinized saline. Dermabond was then placed as a dressing. The patient tolerated the procedure well and was taken to the recovery room in stable condition.   Leotis Pain 01/02/2019 12:21 PM   This note was created with Dragon Medical transcription system. Any errors in dictation are purely unintentional.

## 2019-01-04 ENCOUNTER — Other Ambulatory Visit: Payer: Self-pay | Admitting: *Deleted

## 2019-01-04 DIAGNOSIS — M898X9 Other specified disorders of bone, unspecified site: Secondary | ICD-10-CM

## 2019-01-04 MED ORDER — HYDROCODONE-ACETAMINOPHEN 5-325 MG PO TABS: 1.0000 | ORAL_TABLET | Freq: Two times a day (BID) | ORAL | 0 refills | Status: DC | PRN

## 2019-01-07 LAB — ABO/RH: ABO/RH(D): A POS

## 2019-01-09 ENCOUNTER — Inpatient Hospital Stay: Payer: Medicare Other

## 2019-01-09 ENCOUNTER — Inpatient Hospital Stay (HOSPITAL_BASED_OUTPATIENT_CLINIC_OR_DEPARTMENT_OTHER): Payer: Medicare Other | Admitting: Oncology

## 2019-01-09 ENCOUNTER — Encounter: Payer: Self-pay | Admitting: Oncology

## 2019-01-09 VITALS — BP 104/70 | HR 79 | Temp 97.1°F | Ht 65.0 in | Wt 144.2 lb

## 2019-01-09 DIAGNOSIS — Z87891 Personal history of nicotine dependence: Secondary | ICD-10-CM | POA: Diagnosis not present

## 2019-01-09 DIAGNOSIS — D6959 Other secondary thrombocytopenia: Secondary | ICD-10-CM | POA: Diagnosis not present

## 2019-01-09 DIAGNOSIS — D6481 Anemia due to antineoplastic chemotherapy: Secondary | ICD-10-CM | POA: Insufficient documentation

## 2019-01-09 DIAGNOSIS — C3490 Malignant neoplasm of unspecified part of unspecified bronchus or lung: Secondary | ICD-10-CM

## 2019-01-09 DIAGNOSIS — C3402 Malignant neoplasm of left main bronchus: Secondary | ICD-10-CM | POA: Diagnosis not present

## 2019-01-09 DIAGNOSIS — D696 Thrombocytopenia, unspecified: Secondary | ICD-10-CM

## 2019-01-09 DIAGNOSIS — Z5111 Encounter for antineoplastic chemotherapy: Secondary | ICD-10-CM | POA: Diagnosis not present

## 2019-01-09 DIAGNOSIS — R05 Cough: Secondary | ICD-10-CM | POA: Diagnosis not present

## 2019-01-09 DIAGNOSIS — D649 Anemia, unspecified: Secondary | ICD-10-CM | POA: Diagnosis not present

## 2019-01-09 DIAGNOSIS — Z5112 Encounter for antineoplastic immunotherapy: Secondary | ICD-10-CM | POA: Diagnosis not present

## 2019-01-09 DIAGNOSIS — T451X5A Adverse effect of antineoplastic and immunosuppressive drugs, initial encounter: Secondary | ICD-10-CM

## 2019-01-09 DIAGNOSIS — R5383 Other fatigue: Secondary | ICD-10-CM

## 2019-01-09 LAB — CBC WITH DIFFERENTIAL/PLATELET
Abs Immature Granulocytes: 0.21 10*3/uL — ABNORMAL HIGH (ref 0.00–0.07)
BASOS ABS: 0 10*3/uL (ref 0.0–0.1)
Basophils Relative: 0 %
Eosinophils Absolute: 0 10*3/uL (ref 0.0–0.5)
Eosinophils Relative: 0 %
HCT: 28.5 % — ABNORMAL LOW (ref 36.0–46.0)
Hemoglobin: 9.2 g/dL — ABNORMAL LOW (ref 12.0–15.0)
Immature Granulocytes: 3 %
LYMPHS ABS: 1.7 10*3/uL (ref 0.7–4.0)
Lymphocytes Relative: 25 %
MCH: 30.5 pg (ref 26.0–34.0)
MCHC: 32.3 g/dL (ref 30.0–36.0)
MCV: 94.4 fL (ref 80.0–100.0)
Monocytes Absolute: 1.2 10*3/uL — ABNORMAL HIGH (ref 0.1–1.0)
Monocytes Relative: 17 %
NRBC: 0 % (ref 0.0–0.2)
Neutro Abs: 3.9 10*3/uL (ref 1.7–7.7)
Neutrophils Relative %: 55 %
Platelets: 251 10*3/uL (ref 150–400)
RBC: 3.02 MIL/uL — ABNORMAL LOW (ref 3.87–5.11)
RDW: 20.9 % — ABNORMAL HIGH (ref 11.5–15.5)
WBC: 7.1 10*3/uL (ref 4.0–10.5)

## 2019-01-09 LAB — COMPREHENSIVE METABOLIC PANEL
ALT: 11 U/L (ref 0–44)
AST: 16 U/L (ref 15–41)
Albumin: 3.3 g/dL — ABNORMAL LOW (ref 3.5–5.0)
Alkaline Phosphatase: 97 U/L (ref 38–126)
Anion gap: 8 (ref 5–15)
BUN: 6 mg/dL — ABNORMAL LOW (ref 8–23)
CO2: 24 mmol/L (ref 22–32)
Calcium: 8.7 mg/dL — ABNORMAL LOW (ref 8.9–10.3)
Chloride: 105 mmol/L (ref 98–111)
Creatinine, Ser: 0.5 mg/dL (ref 0.44–1.00)
GFR calc Af Amer: >60 mL/min (ref >60)
GFR calc non Af Amer: >60 mL/min (ref >60)
Glucose, Bld: 106 mg/dL — ABNORMAL HIGH (ref 70–99)
Potassium: 3.7 mmol/L (ref 3.5–5.1)
SODIUM: 137 mmol/L (ref 135–145)
Total Bilirubin: 0.4 mg/dL (ref 0.3–1.2)
Total Protein: 6.6 g/dL (ref 6.5–8.1)

## 2019-01-09 LAB — TSH: TSH: 1.427 u[IU]/mL (ref 0.350–4.500)

## 2019-01-09 MED ORDER — SODIUM CHLORIDE 0.9 % IV SOLN
1200.0000 mg | Freq: Once | INTRAVENOUS | Status: AC
  Administered 2019-01-09: 1200 mg via INTRAVENOUS
  Filled 2019-01-09: qty 20

## 2019-01-09 MED ORDER — SODIUM CHLORIDE 0.9 % IV SOLN
Freq: Once | INTRAVENOUS | Status: AC
  Administered 2019-01-09: 10:00:00 via INTRAVENOUS
  Filled 2019-01-09: qty 5

## 2019-01-09 MED ORDER — SODIUM CHLORIDE 0.9 % IV SOLN
390.0000 mg | Freq: Once | INTRAVENOUS | Status: AC
  Administered 2019-01-09: 390 mg via INTRAVENOUS
  Filled 2019-01-09: qty 39

## 2019-01-09 MED ORDER — SODIUM CHLORIDE 0.9 % IV SOLN
100.0000 mg/m2 | Freq: Once | INTRAVENOUS | Status: AC
  Administered 2019-01-09: 180 mg via INTRAVENOUS
  Filled 2019-01-09: qty 9

## 2019-01-09 MED ORDER — HEPARIN SOD (PORK) LOCK FLUSH 100 UNIT/ML IV SOLN: 500.0000 [IU] | Freq: Once | INTRAVENOUS | Status: AC

## 2019-01-09 MED ORDER — SODIUM CHLORIDE 0.9 % IV SOLN
Freq: Once | INTRAVENOUS | Status: AC
  Administered 2019-01-09: 10:00:00 via INTRAVENOUS
  Filled 2019-01-09: qty 250

## 2019-01-09 MED ORDER — PALONOSETRON HCL INJECTION 0.25 MG/5ML
0.2500 mg | Freq: Once | INTRAVENOUS | Status: AC
  Administered 2019-01-09: 0.25 mg via INTRAVENOUS
  Filled 2019-01-09: qty 5

## 2019-01-09 MED ORDER — SODIUM CHLORIDE 0.9% FLUSH
10.0000 mL | INTRAVENOUS | Status: AC | PRN
  Administered 2019-01-09: 10 mL via INTRAVENOUS
  Filled 2019-01-09: qty 10

## 2019-01-09 MED ORDER — HEPARIN SOD (PORK) LOCK FLUSH 100 UNIT/ML IV SOLN
500.0000 [IU] | Freq: Once | INTRAVENOUS | Status: AC | PRN
  Administered 2019-01-09: 500 [IU]

## 2019-01-09 NOTE — Progress Notes (Signed)
Patient here today for follow up.  Patient c/o diarrhea, using imodium.

## 2019-01-09 NOTE — Progress Notes (Signed)
Hematology/Oncology Follow up note [facility] Telephone:([phone]) [phone] Fax:(336) [phone]   Patient Care Team: Glean Hess, MD as PCP - General (Internal Medicine) Charolette Forward, MD as Consulting Physician (Cardiology) Telford Nab, RN as Registered Nurse  REFERRING PROVIDER: Dr. 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 Patient recently was referred to see pulmonology Dr. Felicie Morn for persistent cough and dyspnea.  Patient reports coughing up clear sputum for about 2 months.  No hemoptysis.  She also had weight loss. Former smoker, quitting smoking 2 weeks ago.  39-pack-year smoking history  09/30/2018 chest x-ray showed new market volume loss in the left chest area of the left mainstem bronchus worrisome for centrally obstructing mass. 10/17/2018 chest x-ray showed volume loss worsened in the left hemithorax worsening patchy consolidation in the mid to lower left lung with minimal residual aeration and up in the left lung mass not excluded.  Possible small left pleural effusion. 10/31 2019 CT chest with contrast showed large mediastinal mass involving both hilar, left greater than right, consistent with lung carcinoma The mass causes narrowing of the left mainstem bronchus with resultant volume loss on the left and a mediastinal shift to the left.  Moderate size left pleural effusion. 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  Today patient was accompanied by husband and daughter to clinic to discuss diagnosis and management plan. She reports feeling tired feels shortness of breath with exertion.  Persistent coughing, productive with clear sputum. Also  reports difficulty swallowing, feels food sticking in her food pipe. MRI brain was obtained which was negative for intracranial metastatic disease.  INTERVAL HISTORY Kristi [name] is a 72 y.o. female who has above history reviewed by me today presents for acute visit for evaluation of cycle 4 chemotherapy and immunotherapy for treatment of extensive small cell lung cancer.  #She received 1 unit of PRBC blood transfusion after cycle 3 chemotherapy.\Hemoglobin improved.  Patient reports fatigue has significantly improved as well. #Reports chronic dry cough, intermittent.  Denies shortness of breath. Denies nausea vomiting abdominal pain. She also had a few days loose stool and she took Imodium and symptoms have completely resolved. Denies any fever or chills. She is leaving town at the end of this week to go to [place] to attend daughter's wedding.  She will return to [place] on two third 2020.  Review of Systems  Constitutional: Positive for fatigue. Negative for appetite change, chills and fever.  HENT:   Negative for hearing loss and voice change.   Eyes: Negative for eye problems.  Respiratory: Positive for cough. Negative for chest tightness and shortness of breath.   Cardiovascular: Negative for chest pain.  Gastrointestinal: Negative for abdominal distention, abdominal pain and blood in stool.  Endocrine: Negative for hot flashes.  Genitourinary: Negative for difficulty urinating and frequency.   Musculoskeletal: Negative for arthralgias.  Skin: Negative for itching and rash.  Neurological: Negative for extremity weakness.  Hematological: Negative for adenopathy.  Psychiatric/Behavioral: Negative for confusion.     MEDICAL HISTORY:  Past Medical History:  Diagnosis Date  . Acute MI, inferoposterior wall ([place]) 09/30/2014  . Claustrophobia   . Coronary artery disease   . Hypercholesteremia   . Hypertension   .  MI, old   . 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 [facility]) 10/27/2018     SURGICAL HISTORY: Past Surgical History:  Procedure Laterality Date  . APPENDECTOMY     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  . BLADDER NECK SUSPENSION    . CHOLECYSTECTOMY N/A 08/14/2018   Procedure: LAPAROSCOPIC CHOLECYSTECTOMY;  Surgeon: Jules Husbands, MD;  Location: [facility] ORS;  Service: General;  Laterality: N/A;  . CHOLECYSTECTOMY  11/2018  . CORONARY STENT PLACEMENT    . ENDOBRONCHIAL ULTRASOUND N/A 10/21/2018   Procedure: ENDOBRONCHIAL ULTRASOUND;  Surgeon: Laverle Hobby, MD;  Location: [facility] ORS;  Service: Pulmonary;  Laterality: N/A;  . HERNIA REPAIR    . IR FLUORO GUIDE CV LINE RIGHT  12/19/2018  . LEFT HEART CATHETERIZATION WITH CORONARY ANGIOGRAM N/A 09/29/2014   Procedure: LEFT HEART CATHETERIZATION WITH CORONARY ANGIOGRAM;  Surgeon: Clent Demark, MD;  Location: [facility] CATH LAB;  Service: Cardiovascular;  Laterality: N/A;  . PORTA CATH INSERTION N/A 01/02/2019   Procedure: PORTA CATH INSERTION;  Surgeon: Algernon Huxley, MD;  Location: [place] CV LAB;  Service: Cardiovascular;  Laterality: N/A;  . PORTACATH PLACEMENT Right 10/28/2018   Procedure: INSERTION PORT-A-CATH;  Surgeon: Jules Husbands, MD;  Location: [facility] ORS;  Service: General;  Laterality: Right;    SOCIAL HISTORY: Social History   Socioeconomic History  . Marital status: Married    Spouse name: [name]   . Number of children: 3  . Years of education: Not on file  . Highest education level: Not on file  Occupational History  . Occupation: Retired    Comment: Chief Technology Officer   Social Needs  . Financial resource strain: Not very hard  . Food insecurity:    Worry: Not on file    Inability: Not on file  . Transportation needs:    Medical: No    Non-medical: No  Tobacco Use  . Smoking status: Former Smoker    Packs/day: 1.00    Years: 39.00    Pack years: 39.00    Types: Cigarettes    Start date: 12/21/1978    Last attempt to quit: 10/16/2018    Years since quitting: 0.2  . Smokeless tobacco: Never  Used  Substance and Sexual Activity  . Alcohol use: Yes    Alcohol/week: 0.0 standard drinks    Comment: occassional - approx 1 every 2 weeks   . Drug use: No  . Sexual activity: Yes    Birth control/protection: None  Lifestyle  . Physical activity:    Days per week: 0 days    Minutes per session: Not on file  . Stress: To some extent  Relationships  . Social connections:    Talks on phone: Three times a week    Gets together: Once a week    Attends religious service: 1 to 4 times per year    Active member of club or organization: Not on file    Attends meetings of clubs or organizations: Not on file    Relationship status: Married  . Intimate partner violence:    Fear of current or ex partner: No    Emotionally abused: No    Physically abused: No    Forced sexual activity: No  Other Topics Concern  . Not on file  Social History Narrative  . Not on file    FAMILY HISTORY: Family History  Problem Relation Age of Onset  . Alzheimer's disease Mother   . Colon cancer Father   . Breast cancer  Neg Hx     ALLERGIES:  has No Known Allergies.  MEDICATIONS:  Current Outpatient Medications  Medication Sig Dispense Refill  . albuterol (PROAIR HFA) 108 (90 Base) MCG/ACT inhaler Inhale 2 puffs into the lungs every 6 (six) hours as needed for wheezing or shortness of breath. 18 g 2  . ALPRAZolam (XANAX) 0.5 MG tablet Take medication with you to MRI. Take 1 tablet prior to MRI. 5 tablet 0  . AMBULATORY NON FORMULARY MEDICATION Medication Name: Please provide patient with Nebulizer. DX: J44.9 1 each 0  . aspirin EC 81 MG tablet Take 81 mg by mouth daily.    [name] Kitchen atorvastatin (LIPITOR) 80 MG tablet Take 0.5 tablets (40 mg total) by mouth daily at 6 PM. (Patient taking differently: Take 80 mg by mouth at bedtime. ) 30 tablet 3  . B Complex Vitamins (VITAMIN B COMPLEX PO) Take 1 tablet by mouth daily.     . benzonatate (TESSALON PERLES) 100 MG capsule Take 2 capsules (200 mg total) by  mouth 3 (three) times daily. 90 capsule 2  . CALCIUM PO Take 1 tablet by mouth daily.     . cetirizine (ZYRTEC) 10 MG tablet Take 10 mg by mouth daily.    . chlorhexidine (PERIDEX) 0.12 % solution Use as directed 15 mLs in the mouth or throat 2 (two) times daily. 473 mL 0  . Cholecalciferol (VITAMIN D) 2000 units CAPS Take 1 capsule (2,000 Units total) by mouth daily. 30 capsule   . dexamethasone (DECADRON) 4 MG tablet Take 8mg  daily for 2 days after chemtherapy treatments. 30 tablet 1  . docusate sodium (COLACE) 100 MG capsule Take 100 mg by mouth daily.    [name] Kitchen dronabinol (MARINOL) 5 MG capsule Take 1 capsule (5 mg total) by mouth 2 (two) times daily before a meal. 60 capsule 0  . Fluticasone-Umeclidin-Vilant (TRELEGY ELLIPTA) 100-62.5-25 MCG/INH AEPB Inhale 1 puff into the lungs daily. 1 each 12  . HYDROcodone-acetaminophen (NORCO/VICODIN) 5-325 MG tablet Take 1 tablet by mouth every 12 (twelve) hours as needed for moderate pain. 30 tablet 0  . ipratropium-albuterol (DUONEB) 0.5-2.5 (3) MG/3ML SOLN Take 3 mLs by nebulization 3 (three) times daily. DX. J44.9 360 mL 1  . lidocaine-prilocaine (EMLA) cream Apply to affected area once 30 g 3  . metoprolol tartrate (LOPRESSOR) 25 MG tablet Take 12.5 mg by mouth 2 (two) times daily.    . nitroGLYCERIN (NITROSTAT) 0.4 MG SL tablet Place 1 tablet (0.4 mg total) under the tongue every 5 (five) minutes x 3 doses as needed for chest pain. (Patient not taking: Reported on 01/02/2019) 25 tablet 12  . omeprazole (PRILOSEC) 40 MG capsule TAKE 1 CAPSULE(40 MG) BY MOUTH DAILY 90 capsule 0  . ondansetron (ZOFRAN) 8 MG tablet Take 1 tablet (8 mg total) by mouth 2 (two) times daily as needed for refractory nausea / vomiting. Start on day 3 after carboplatin chemo. 30 tablet 1  . potassium chloride SA (K-DUR,KLOR-CON) 20 MEQ tablet TAKE 1 TABLET(20 MEQ) BY MOUTH DAILY 90 tablet 1  . prochlorperazine (COMPAZINE) 10 MG tablet TAKE 1 TABLET BY MOUTH EVERY 6 HOURS AS NEEDED  FOR NAUSEA OR VOMITING 337 tablet 1  . sertraline (ZOLOFT) 50 MG tablet Take 1 tablet (50 mg total) by mouth daily. 30 tablet 1  . sucralfate (CARAFATE) 1 g tablet TAKE 1 TABLET BY MOUTH FOUR TIMES DAILY, WITH MEALS& AT BEDTIME (Patient not taking: Reported on 01/02/2019) 368 tablet 0   No current facility-administered medications  for this visit.    Facility-Administered Medications Ordered in Other Visits  Medication Dose Route Frequency Provider Last Rate Last Dose  . heparin lock flush 100 unit/mL  500 Units Intravenous Once Earlie Server, MD      . sodium chloride flush (NS) 0.9 % injection 10 mL  10 mL Intravenous PRN Earlie Server, MD   10 mL at 01/09/19 0820     PHYSICAL EXAMINATION: ECOG PERFORMANCE STATUS: 1 - Symptomatic but completely ambulatory Vitals:   01/09/19 0853  BP: 104/70  Pulse: 79  Temp: (!) 97.1 F (36.2 C)   Filed Weights   01/09/19 0853  Weight: 144 lb 4 oz (65.4 kg)   Physical Exam  Constitutional: She is oriented to person, place, and time. No distress.  HENT:  Head: Normocephalic and atraumatic.  Mouth/Throat: No oropharyngeal exudate.  Eyes: Pupils are equal, round, and reactive to light. EOM are normal. No scleral icterus.  Neck: Normal range of motion. Neck supple.  Cardiovascular: Normal rate and regular rhythm.  No murmur heard. Pulmonary/Chest: Effort normal. No respiratory distress. She has no wheezes. She has no rales.  Decreased breath sounds bilaterally.  Abdominal: Soft. She exhibits no distension. There is no abdominal tenderness.  Musculoskeletal: Normal range of motion.        General: No edema.  Neurological: She is alert and oriented to person, place, and time. No cranial nerve deficit. She exhibits normal muscle tone. Coordination normal.  Skin: Skin is warm and dry. She is not diaphoretic. No erythema.  Psychiatric: Affect normal.       LABORATORY DATA:  I have reviewed the data as listed Lab Results  Component Value Date   WBC  7.1 01/09/2019   HGB 9.2 (L) 01/09/2019   HCT 28.5 (L) 01/09/2019   MCV 94.4 01/09/2019   PLT 251 01/09/2019   Recent Labs    08/12/18 1438  12/28/18 0950 01/02/19 0943 01/09/19 0811  NA 131*   < > 135 141 137  K 3.7   < > 3.5 3.5 3.7  CL 97*   < > 99 107 105  CO2 25   < > 24 26 24   GLUCOSE 122*   < > 104* 103* 106*  BUN 9   < > 10 7* 6*  CREATININE 0.74   < > 0.49 0.54 0.50  CALCIUM 9.1   < > 8.8* 8.9 8.7*  GFRNONAA >60   < > >60 >60 >60  GFRAA >60   < > >60 >60 >60  PROT 8.0   < > 6.5 6.5 6.6  ALBUMIN 4.3   < > 3.5 3.2* 3.3*  AST 33   < > 18 12* 16  ALT 26   < > 16 12 11   ALKPHOS 81   < > 84 94 97  BILITOT 0.6   < > 0.6 0.4 0.4  BILIDIR 0.2  --   --   --   --   IBILI 0.4  --   --   --   --    < > = values in this interval not displayed.    RADIOGRAPHIC STUDIES: I have personally reviewed the radiological images as listed and agreed with the findings in the report. . 10/20/2018 CT chest w contrast . Large mediastinal mass involving both hila left much greater than right consistent with lung carcinoma possibly small cell lung carcinoma. This mass causes narrowing of the left mainstem bronchus with resultant volume loss on the left and mediastinal  shift to the Left. 2. Moderate size left pleural effusion. 3. Coronary artery calcifications and moderate thoracic aortic atherosclerosis.  PET 10/26/2018 . Large left lung mass is markedly hypermetabolic in the confluent lymphadenopathy in the mediastinum and in bilateral hilar regions also shows marked hypermetabolism. 2. Hypermetabolic metastatic lymphadenopathy in the right neck and supraclavicular region. 3. Hypermetabolic lymphadenopathy in the hepato duodenal ligament consistent with metastatic disease. 4. Large hypermetabolic lesion posterior right acetabulum without correlate of CT finding. Features highly suspicious for bony metastatic disease.  MRI brain 10/28/2018 1. No metastatic disease or acute intracranial  abnormality identified. 2. 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  3. Moderately advanced but nonspecific cerebral white matter signal changes, most commonly due to chronic small vessel disease. Lesser signal changes in the deep gray matter and pons may also be small vessel related.  ASSESSMENT & PLAN:  1. Small cell lung cancer ([place])   2. Thrombocytopenia ([place])   3. Anemia due to antineoplastic chemotherapy    #Extensive small cell lung cancer Labs reviewed and discussed with patient.  Counts are acceptable to proceed with cycle 4 carboplatin, etoposide and Tecentriq.  With G-CSF report with Udenyca on day 4. Obtain CT chest abdomen pelvis after cycle 4 chemotherapy for treatment response assessment. If no significant residual disease, will switch patient to Tecentriq at next visit.  If still residual disease, I may continue another cycle of chemotherapy.  Discussed with patient and she is in agreement.  #Anemia, status post 1 unit of irradiated PRBC transfusion during interval.  Hemoglobin improved to 9.2 today. Clinically doing better as well.  Continue monitor. Patient is going to return to [place] from her trip to [place] on 2/3 2020.  Advised patient to call if she feels weak or fatigued again and we will have labs done to see if she needs additional blood transfusion. Also advised patient to seek medical advice in [place] if she does not feel well during her trip. Her blood transfusion need to be done with irradiated products.  Patient is aware about that.  #Dry cough, etiology unknown.  Checks x-ray with no  significant finding.  CT scan of chest prior to next visit.   We spent sufficient time to discuss many aspect of care, questions were answered to patient's satisfaction. The patient knows to call the clinic with any problems questions or concerns. Orders Placed This Encounter  Procedures  . CT Chest W Contrast    Standing Status:   Future    Standing  Expiration Date:   01/09/2020    Order Specific Question:   ** REASON FOR EXAM (FREE TEXT)    Answer:   small cell lung cancer s/p 4 cycles of chemotherapy    Order Specific Question:   If indicated for the ordered procedure, I authorize the administration of contrast media per Radiology protocol    Answer:   Yes    Order Specific Question:   Preferred imaging location?    Answer:   [facility]    Order Specific Question:   Radiology Contrast Protocol - do NOT remove file path    Answer:   \\[email or web address]  . CT Abdomen Pelvis W Contrast    Standing Status:   Future    Standing Expiration Date:   01/09/2020    Order Specific Question:   ** REASON FOR EXAM (FREE TEXT)    Answer:   small cell lung cancer s/p 4 cycles of chemo    Order Specific Question:  If indicated for the ordered procedure, I authorize the administration of contrast media per Radiology protocol    Answer:   Yes    Order Specific Question:   Preferred imaging location?    Answer:   [facility]    Order Specific Question:   Is Oral Contrast requested for this exam?    Answer:   Yes, Per Radiology protocol    Order Specific Question:   Radiology Contrast Protocol - do NOT remove file path    Answer:   \\[email or web address]    Return of visit: 3 weeks for reevaluation prior to chemotherapy and immunotherapy.  Earlie Server, MD, PhD Hematology Oncology [facility] Pager- [phone] 01/09/2019

## 2019-01-10 ENCOUNTER — Inpatient Hospital Stay: Payer: Medicare Other

## 2019-01-10 DIAGNOSIS — Z5112 Encounter for antineoplastic immunotherapy: Secondary | ICD-10-CM | POA: Diagnosis not present

## 2019-01-10 DIAGNOSIS — D649 Anemia, unspecified: Secondary | ICD-10-CM | POA: Diagnosis not present

## 2019-01-10 DIAGNOSIS — C3490 Malignant neoplasm of unspecified part of unspecified bronchus or lung: Secondary | ICD-10-CM

## 2019-01-10 DIAGNOSIS — D6959 Other secondary thrombocytopenia: Secondary | ICD-10-CM | POA: Diagnosis not present

## 2019-01-10 DIAGNOSIS — Z5111 Encounter for antineoplastic chemotherapy: Secondary | ICD-10-CM | POA: Diagnosis not present

## 2019-01-10 DIAGNOSIS — C3402 Malignant neoplasm of left main bronchus: Secondary | ICD-10-CM | POA: Diagnosis not present

## 2019-01-10 DIAGNOSIS — D6481 Anemia due to antineoplastic chemotherapy: Secondary | ICD-10-CM | POA: Diagnosis not present

## 2019-01-10 MED ORDER — SODIUM CHLORIDE 0.9 % IV SOLN
Freq: Once | INTRAVENOUS | Status: AC
  Administered 2019-01-10: 14:00:00 via INTRAVENOUS
  Filled 2019-01-10: qty 250

## 2019-01-10 MED ORDER — DEXAMETHASONE SODIUM PHOSPHATE 10 MG/ML IJ SOLN
10.0000 mg | Freq: Once | INTRAMUSCULAR | Status: AC
  Administered 2019-01-10: 10 mg via INTRAVENOUS
  Filled 2019-01-10: qty 1

## 2019-01-10 MED ORDER — ACETAMINOPHEN 325 MG PO TABS
650.0000 mg | ORAL_TABLET | Freq: Once | ORAL | Status: AC
  Administered 2019-01-10: 650 mg via ORAL
  Filled 2019-01-10: qty 2

## 2019-01-10 MED ORDER — SODIUM CHLORIDE 0.9 % IV SOLN
100.0000 mg/m2 | Freq: Once | INTRAVENOUS | Status: AC
  Administered 2019-01-10: 180 mg via INTRAVENOUS
  Filled 2019-01-10: qty 9

## 2019-01-10 MED ORDER — HEPARIN SOD (PORK) LOCK FLUSH 100 UNIT/ML IV SOLN
500.0000 [IU] | Freq: Once | INTRAVENOUS | Status: AC
  Administered 2019-01-10: 500 [IU] via INTRAVENOUS

## 2019-01-10 MED ORDER — SODIUM CHLORIDE 0.9% FLUSH
10.0000 mL | INTRAVENOUS | Status: AC | PRN
  Administered 2019-01-10: 10 mL via INTRAVENOUS
  Filled 2019-01-10: qty 10

## 2019-01-11 ENCOUNTER — Inpatient Hospital Stay: Payer: Medicare Other

## 2019-01-11 VITALS — BP 108/69 | HR 71 | Temp 98.5°F | Resp 20

## 2019-01-11 DIAGNOSIS — D6959 Other secondary thrombocytopenia: Secondary | ICD-10-CM | POA: Diagnosis not present

## 2019-01-11 DIAGNOSIS — Z5112 Encounter for antineoplastic immunotherapy: Secondary | ICD-10-CM | POA: Diagnosis not present

## 2019-01-11 DIAGNOSIS — Z5111 Encounter for antineoplastic chemotherapy: Secondary | ICD-10-CM | POA: Diagnosis not present

## 2019-01-11 DIAGNOSIS — D649 Anemia, unspecified: Secondary | ICD-10-CM | POA: Diagnosis not present

## 2019-01-11 DIAGNOSIS — C3490 Malignant neoplasm of unspecified part of unspecified bronchus or lung: Secondary | ICD-10-CM

## 2019-01-11 DIAGNOSIS — D6481 Anemia due to antineoplastic chemotherapy: Secondary | ICD-10-CM | POA: Diagnosis not present

## 2019-01-11 DIAGNOSIS — C3402 Malignant neoplasm of left main bronchus: Secondary | ICD-10-CM | POA: Diagnosis not present

## 2019-01-11 MED ORDER — HEPARIN SOD (PORK) LOCK FLUSH 100 UNIT/ML IV SOLN
500.0000 [IU] | Freq: Once | INTRAVENOUS | Status: AC | PRN
  Administered 2019-01-11: 500 [IU]
  Filled 2019-01-11: qty 5

## 2019-01-11 MED ORDER — SODIUM CHLORIDE 0.9 % IV SOLN
100.0000 mg/m2 | Freq: Once | INTRAVENOUS | Status: AC
  Administered 2019-01-11: 180 mg via INTRAVENOUS
  Filled 2019-01-11: qty 9

## 2019-01-11 MED ORDER — SODIUM CHLORIDE 0.9% FLUSH
10.0000 mL | INTRAVENOUS | Status: DC | PRN
  Administered 2019-01-11: 10 mL
  Filled 2019-01-11: qty 10

## 2019-01-11 MED ORDER — SODIUM CHLORIDE 0.9 % IV SOLN
Freq: Once | INTRAVENOUS | Status: AC
  Administered 2019-01-11: 14:00:00 via INTRAVENOUS
  Filled 2019-01-11: qty 250

## 2019-01-11 MED ORDER — DEXAMETHASONE SODIUM PHOSPHATE 10 MG/ML IJ SOLN
10.0000 mg | Freq: Once | INTRAMUSCULAR | Status: AC
  Administered 2019-01-11: 10 mg via INTRAVENOUS
  Filled 2019-01-11: qty 1

## 2019-01-12 ENCOUNTER — Inpatient Hospital Stay: Payer: Medicare Other

## 2019-01-12 DIAGNOSIS — Z5111 Encounter for antineoplastic chemotherapy: Secondary | ICD-10-CM | POA: Diagnosis not present

## 2019-01-12 DIAGNOSIS — C3490 Malignant neoplasm of unspecified part of unspecified bronchus or lung: Secondary | ICD-10-CM

## 2019-01-12 DIAGNOSIS — D649 Anemia, unspecified: Secondary | ICD-10-CM | POA: Diagnosis not present

## 2019-01-12 DIAGNOSIS — D6959 Other secondary thrombocytopenia: Secondary | ICD-10-CM | POA: Diagnosis not present

## 2019-01-12 DIAGNOSIS — C3402 Malignant neoplasm of left main bronchus: Secondary | ICD-10-CM | POA: Diagnosis not present

## 2019-01-12 DIAGNOSIS — D6481 Anemia due to antineoplastic chemotherapy: Secondary | ICD-10-CM | POA: Diagnosis not present

## 2019-01-12 DIAGNOSIS — Z5112 Encounter for antineoplastic immunotherapy: Secondary | ICD-10-CM | POA: Diagnosis not present

## 2019-01-12 MED ORDER — PEGFILGRASTIM-CBQV 6 MG/0.6ML ~~LOC~~ SOSY
6.0000 mg | PREFILLED_SYRINGE | Freq: Once | SUBCUTANEOUS | Status: AC
  Administered 2019-01-12: 6 mg via SUBCUTANEOUS

## 2019-01-16 ENCOUNTER — Other Ambulatory Visit: Payer: Self-pay | Admitting: *Deleted

## 2019-01-16 DIAGNOSIS — M898X9 Other specified disorders of bone, unspecified site: Secondary | ICD-10-CM

## 2019-01-16 MED ORDER — HYDROCODONE-ACETAMINOPHEN 5-325 MG PO TABS: 1.0000 | ORAL_TABLET | Freq: Two times a day (BID) | ORAL | 0 refills | Status: DC | PRN

## 2019-01-17 ENCOUNTER — Encounter: Payer: Self-pay | Admitting: *Deleted

## 2019-01-20 ENCOUNTER — Inpatient Hospital Stay: Payer: Medicare Other | Admitting: Hospice and Palliative Medicine

## 2019-01-24 ENCOUNTER — Other Ambulatory Visit: Payer: Self-pay

## 2019-01-24 ENCOUNTER — Telehealth: Payer: Self-pay | Admitting: *Deleted

## 2019-01-24 ENCOUNTER — Inpatient Hospital Stay
Admission: EM | Admit: 2019-01-24 | Discharge: 2019-01-25 | DRG: 872 | Disposition: A | Discharge status: Home / Self Care | Payer: Medicare Other | Attending: Internal Medicine | Admitting: Internal Medicine

## 2019-01-24 ENCOUNTER — Ambulatory Visit
Admission: RE | Admit: 2019-01-24 | Discharge: 2019-01-24 | Disposition: A | Discharge status: Home / Self Care | Payer: Medicare Other | Source: Ambulatory Visit | Attending: Oncology | Admitting: Oncology

## 2019-01-24 DIAGNOSIS — R1310 Dysphagia, unspecified: Secondary | ICD-10-CM | POA: Diagnosis present

## 2019-01-24 DIAGNOSIS — E876 Hypokalemia: Secondary | ICD-10-CM | POA: Diagnosis present

## 2019-01-24 DIAGNOSIS — Z87891 Personal history of nicotine dependence: Secondary | ICD-10-CM | POA: Diagnosis not present

## 2019-01-24 DIAGNOSIS — N3080 Other cystitis without hematuria: Secondary | ICD-10-CM | POA: Diagnosis present

## 2019-01-24 DIAGNOSIS — Z9049 Acquired absence of other specified parts of digestive tract: Secondary | ICD-10-CM | POA: Diagnosis not present

## 2019-01-24 DIAGNOSIS — N12 Tubulo-interstitial nephritis, not specified as acute or chronic: Secondary | ICD-10-CM

## 2019-01-24 DIAGNOSIS — E7800 Pure hypercholesterolemia, unspecified: Secondary | ICD-10-CM | POA: Diagnosis present

## 2019-01-24 DIAGNOSIS — Z66 Do not resuscitate: Secondary | ICD-10-CM | POA: Diagnosis present

## 2019-01-24 DIAGNOSIS — E559 Vitamin D deficiency, unspecified: Secondary | ICD-10-CM | POA: Diagnosis present

## 2019-01-24 DIAGNOSIS — C3490 Malignant neoplasm of unspecified part of unspecified bronchus or lung: Secondary | ICD-10-CM | POA: Diagnosis present

## 2019-01-24 DIAGNOSIS — T451X5A Adverse effect of antineoplastic and immunosuppressive drugs, initial encounter: Secondary | ICD-10-CM | POA: Diagnosis present

## 2019-01-24 DIAGNOSIS — A419 Sepsis, unspecified organism: Principal | ICD-10-CM | POA: Diagnosis present

## 2019-01-24 DIAGNOSIS — N10 Acute tubulo-interstitial nephritis: Secondary | ICD-10-CM | POA: Diagnosis present

## 2019-01-24 DIAGNOSIS — Z820 Family history of epilepsy and other diseases of the nervous system: Secondary | ICD-10-CM | POA: Diagnosis not present

## 2019-01-24 DIAGNOSIS — G894 Chronic pain syndrome: Secondary | ICD-10-CM | POA: Diagnosis present

## 2019-01-24 DIAGNOSIS — M5030 Other cervical disc degeneration, unspecified cervical region: Secondary | ICD-10-CM | POA: Diagnosis present

## 2019-01-24 DIAGNOSIS — Z800 Family history of malignant neoplasm of digestive organs: Secondary | ICD-10-CM

## 2019-01-24 DIAGNOSIS — C3492 Malignant neoplasm of unspecified part of left bronchus or lung: Secondary | ICD-10-CM | POA: Diagnosis not present

## 2019-01-24 DIAGNOSIS — I252 Old myocardial infarction: Secondary | ICD-10-CM

## 2019-01-24 DIAGNOSIS — E785 Hyperlipidemia, unspecified: Secondary | ICD-10-CM | POA: Diagnosis present

## 2019-01-24 DIAGNOSIS — I2510 Atherosclerotic heart disease of native coronary artery without angina pectoris: Secondary | ICD-10-CM | POA: Diagnosis present

## 2019-01-24 DIAGNOSIS — D649 Anemia, unspecified: Secondary | ICD-10-CM | POA: Diagnosis present

## 2019-01-24 DIAGNOSIS — Z7982 Long term (current) use of aspirin: Secondary | ICD-10-CM | POA: Diagnosis not present

## 2019-01-24 DIAGNOSIS — R1031 Right lower quadrant pain: Secondary | ICD-10-CM | POA: Diagnosis not present

## 2019-01-24 DIAGNOSIS — Z5111 Encounter for antineoplastic chemotherapy: Secondary | ICD-10-CM | POA: Diagnosis not present

## 2019-01-24 DIAGNOSIS — I10 Essential (primary) hypertension: Secondary | ICD-10-CM | POA: Diagnosis present

## 2019-01-24 DIAGNOSIS — J449 Chronic obstructive pulmonary disease, unspecified: Secondary | ICD-10-CM | POA: Diagnosis present

## 2019-01-24 DIAGNOSIS — R197 Diarrhea, unspecified: Secondary | ICD-10-CM | POA: Diagnosis not present

## 2019-01-24 LAB — CBC WITH DIFFERENTIAL/PLATELET
Abs Immature Granulocytes: 2.04 10*3/uL — ABNORMAL HIGH (ref 0.00–0.07)
Basophils Absolute: 0.1 10*3/uL (ref 0.0–0.1)
Basophils Relative: 1 %
Eosinophils Absolute: 0 10*3/uL (ref 0.0–0.5)
Eosinophils Relative: 0 %
HEMATOCRIT: 25.5 % — AB (ref 36.0–46.0)
Hemoglobin: 8.4 g/dL — ABNORMAL LOW (ref 12.0–15.0)
Immature Granulocytes: 12 %
Lymphocytes Relative: 15 %
Lymphs Abs: 2.5 10*3/uL (ref 0.7–4.0)
MCH: 32.2 pg (ref 26.0–34.0)
MCHC: 32.9 g/dL (ref 30.0–36.0)
MCV: 97.7 fL (ref 80.0–100.0)
Monocytes Absolute: 1.4 10*3/uL — ABNORMAL HIGH (ref 0.1–1.0)
Monocytes Relative: 8 %
Neutro Abs: 11.3 10*3/uL — ABNORMAL HIGH (ref 1.7–7.7)
Neutrophils Relative %: 64 %
Platelets: 126 10*3/uL — ABNORMAL LOW (ref 150–400)
RBC: 2.61 MIL/uL — ABNORMAL LOW (ref 3.87–5.11)
RDW: 19.7 % — AB (ref 11.5–15.5)
Smear Review: NORMAL
WBC: 17.3 10*3/uL — ABNORMAL HIGH (ref 4.0–10.5)
nRBC: 0.1 % (ref 0.0–0.2)

## 2019-01-24 LAB — URINALYSIS, COMPLETE (UACMP) WITH MICROSCOPIC
Bilirubin Urine: NEGATIVE
Glucose, UA: NEGATIVE mg/dL
Ketones, ur: NEGATIVE mg/dL
Nitrite: NEGATIVE
PH: 5 (ref 5.0–8.0)
Protein, ur: NEGATIVE mg/dL
Specific Gravity, Urine: 1.017 (ref 1.005–1.030)
WBC, UA: >50 WBC/hpf — ABNORMAL HIGH (ref 0–5)

## 2019-01-24 LAB — COMPREHENSIVE METABOLIC PANEL
ALK PHOS: 97 U/L (ref 38–126)
ALT: 15 U/L (ref 0–44)
AST: 16 U/L (ref 15–41)
Albumin: 3.3 g/dL — ABNORMAL LOW (ref 3.5–5.0)
Anion gap: 7 (ref 5–15)
BILIRUBIN TOTAL: 0.3 mg/dL (ref 0.3–1.2)
BUN: 7 mg/dL — ABNORMAL LOW (ref 8–23)
CO2: 27 mmol/L (ref 22–32)
CREATININE: 0.51 mg/dL (ref 0.44–1.00)
Calcium: 8.5 mg/dL — ABNORMAL LOW (ref 8.9–10.3)
Chloride: 104 mmol/L (ref 98–111)
GFR calc Af Amer: >60 mL/min (ref >60)
GFR calc non Af Amer: >60 mL/min (ref >60)
Glucose, Bld: 92 mg/dL (ref 70–99)
Potassium: 3.2 mmol/L — ABNORMAL LOW (ref 3.5–5.1)
Sodium: 138 mmol/L (ref 135–145)
Total Protein: 6.9 g/dL (ref 6.5–8.1)

## 2019-01-24 LAB — PATHOLOGIST SMEAR REVIEW

## 2019-01-24 LAB — LACTIC ACID, PLASMA: Lactic Acid, Venous: 1.3 mmol/L (ref 0.5–1.9)

## 2019-01-24 MED ORDER — SODIUM CHLORIDE 0.9 % IV BOLUS
1000.0000 mL | Freq: Once | INTRAVENOUS | Status: AC
  Administered 2019-01-24: 1000 mL via INTRAVENOUS

## 2019-01-24 MED ORDER — SODIUM CHLORIDE 0.9 % IV SOLN
1.0000 g | Freq: Three times a day (TID) | INTRAVENOUS | Status: DC
  Administered 2019-01-25: 06:00:00 1 g via INTRAVENOUS
  Filled 2019-01-24 (×4): qty 1

## 2019-01-24 MED ORDER — ACETAMINOPHEN 325 MG PO TABS: 650.0000 mg | ORAL_TABLET | Freq: Four times a day (QID) | ORAL | Status: DC | PRN

## 2019-01-24 MED ORDER — ALBUTEROL SULFATE (2.5 MG/3ML) 0.083% IN NEBU: 3.0000 mL | INHALATION_SOLUTION | Freq: Four times a day (QID) | RESPIRATORY_TRACT | Status: DC | PRN

## 2019-01-24 MED ORDER — PANTOPRAZOLE SODIUM 40 MG PO TBEC
40.0000 mg | DELAYED_RELEASE_TABLET | Freq: Every day | ORAL | Status: DC
  Administered 2019-01-25: 40 mg via ORAL
  Filled 2019-01-24: qty 1

## 2019-01-24 MED ORDER — SERTRALINE HCL 50 MG PO TABS
50.0000 mg | ORAL_TABLET | Freq: Every day | ORAL | Status: DC
  Administered 2019-01-25: 09:00:00 50 mg via ORAL
  Filled 2019-01-24: qty 1

## 2019-01-24 MED ORDER — POTASSIUM CHLORIDE 20 MEQ PO PACK
40.0000 meq | PACK | Freq: Once | ORAL | Status: AC
  Administered 2019-01-24: 40 meq via ORAL
  Filled 2019-01-24: qty 2

## 2019-01-24 MED ORDER — ENOXAPARIN SODIUM 40 MG/0.4ML ~~LOC~~ SOLN: 40.0000 mg | SUBCUTANEOUS | Status: DC

## 2019-01-24 MED ORDER — DOCUSATE SODIUM 100 MG PO CAPS: 100.0000 mg | ORAL_CAPSULE | Freq: Every day | ORAL | Status: DC

## 2019-01-24 MED ORDER — HYDROCODONE-ACETAMINOPHEN 5-325 MG PO TABS
1.0000 | ORAL_TABLET | Freq: Four times a day (QID) | ORAL | Status: DC | PRN
  Administered 2019-01-24: 22:00:00 1 via ORAL
  Filled 2019-01-24: qty 1

## 2019-01-24 MED ORDER — MORPHINE SULFATE (PF) 2 MG/ML IV SOLN: 2.0000 mg | INTRAVENOUS | Status: DC | PRN

## 2019-01-24 MED ORDER — NITROGLYCERIN 0.4 MG SL SUBL: 0.4000 mg | SUBLINGUAL_TABLET | SUBLINGUAL | Status: DC | PRN

## 2019-01-24 MED ORDER — ATORVASTATIN CALCIUM 20 MG PO TABS
40.0000 mg | ORAL_TABLET | Freq: Every day | ORAL | Status: DC
  Administered 2019-01-25: 18:00:00 40 mg via ORAL
  Filled 2019-01-24: qty 2

## 2019-01-24 MED ORDER — ASPIRIN EC 81 MG PO TBEC
81.0000 mg | DELAYED_RELEASE_TABLET | Freq: Every day | ORAL | Status: DC
  Filled 2019-01-24: qty 1

## 2019-01-24 MED ORDER — ONDANSETRON HCL 4 MG PO TABS: 8.0000 mg | ORAL_TABLET | Freq: Three times a day (TID) | ORAL | Status: DC | PRN

## 2019-01-24 MED ORDER — SODIUM CHLORIDE 0.9 % IV SOLN
1.0000 g | Freq: Once | INTRAVENOUS | Status: AC
  Administered 2019-01-24: 1 g via INTRAVENOUS
  Filled 2019-01-24: qty 1

## 2019-01-24 MED ORDER — ACETAMINOPHEN 650 MG RE SUPP: 650.0000 mg | Freq: Four times a day (QID) | RECTAL | Status: DC | PRN

## 2019-01-24 MED ORDER — SODIUM CHLORIDE 0.9 % IV SOLN
INTRAVENOUS | Status: DC
  Administered 2019-01-24 – 2019-01-25 (×2): via INTRAVENOUS

## 2019-01-24 MED ORDER — SUCRALFATE 1 G PO TABS
1.0000 g | ORAL_TABLET | Freq: Three times a day (TID) | ORAL | Status: DC
  Administered 2019-01-25 (×2): 1 g via ORAL
  Filled 2019-01-24 (×2): qty 1

## 2019-01-24 MED ORDER — HYDROCODONE-ACETAMINOPHEN 5-325 MG PO TABS
1.0000 | ORAL_TABLET | ORAL | Status: DC | PRN
  Administered 2019-01-25 (×2): 1 via ORAL
  Filled 2019-01-24 (×2): qty 1

## 2019-01-24 MED ORDER — IOPAMIDOL (ISOVUE-300) INJECTION 61%
100.0000 mL | Freq: Once | INTRAVENOUS | Status: AC | PRN
  Administered 2019-01-24: 100 mL via INTRAVENOUS

## 2019-01-24 MED ORDER — PROCHLORPERAZINE MALEATE 10 MG PO TABS
10.0000 mg | ORAL_TABLET | Freq: Four times a day (QID) | ORAL | Status: DC | PRN
  Filled 2019-01-24: qty 1

## 2019-01-24 MED ORDER — IPRATROPIUM-ALBUTEROL 0.5-2.5 (3) MG/3ML IN SOLN
3.0000 mL | Freq: Three times a day (TID) | RESPIRATORY_TRACT | Status: DC
  Administered 2019-01-25 (×2): 3 mL via RESPIRATORY_TRACT
  Filled 2019-01-24 (×2): qty 3

## 2019-01-24 MED ORDER — SODIUM CHLORIDE 0.9 % IV SOLN
2.0000 g | Freq: Once | INTRAVENOUS | Status: AC
  Administered 2019-01-24: 2 g via INTRAVENOUS
  Filled 2019-01-24: qty 2

## 2019-01-24 MED ORDER — HYDROCODONE-ACETAMINOPHEN 5-325 MG PO TABS
1.0000 | ORAL_TABLET | Freq: Once | ORAL | Status: AC
  Administered 2019-01-24: 1 via ORAL
  Filled 2019-01-24: qty 1

## 2019-01-24 NOTE — Progress Notes (Signed)
CODE SEPSIS - PHARMACY COMMUNICATION  **Broad Spectrum Antibiotics should be administered within 1 hour of Sepsis diagnosis**  Time Code Sepsis Called/Page Received: 1549  Antibiotics Ordered: cefepime  Time of 1st antibiotic administration: [place] ,PharmD, BCPS Clinical Pharmacist  01/24/2019  4:22 PM

## 2019-01-24 NOTE — Telephone Encounter (Signed)
Called report:  IMPRESSION: 1. New extensive gas throughout the bladder wall and within the bladder lumen with underlying mild diffuse bladder wall thickening, compatible with acute emphysematous cystitis. 2. Two new subcentimeter low-attenuation lesions in the right kidney, nonspecific but worrisome for mild acute right pyelonephritis given the above bladder findings. No discrete/drainable renal abscess. No hydronephrosis. 3. Continued positive response to therapy with continued reduction in mediastinal adenopathy. No residual measurable left lung mass. Stable faint sclerosis at the site of the hypermetabolic right acetabular osseous lesion. No new or progressive metastatic disease in the chest, abdomen or pelvis. 4. Small bowel containing small periumbilical hernia without bowel complication at this time. 5. Aortic Atherosclerosis (ICD10-I70.0) and Emphysema (ICD10-J43.9).  These results will be called to the ordering clinician or representative by the Radiologist Assistant, and communication documented in the PACS or zVision Dashboard.   Electronically Signed   By: Ilona Sorrel M.D.   On: 01/24/2019 11:22

## 2019-01-24 NOTE — H&P (Signed)
[facility] [place] NAME: Kristi [name]    MR#:  [id number]  DATE OF BIRTH:  1946/04/14  DATE OF ADMISSION:  01/24/2019  PRIMARY CARE PHYSICIAN: Glean Hess, MD   REQUESTING/REFERRING PHYSICIAN: Orbie Pyo, MD  CHIEF COMPLAINT:  Abdominal pain  HISTORY OF PRESENT ILLNESS:  Kristi [name]  is a 73 y.o. 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 Tasia Catchings her oncologist office.  Patient had a CAT scan earlier today which has revealed emphysematous bladder wall associated with a pyelonephritis of the right kidney.  Patient was given IV fluids and IV cefepime and hospitalist team is consulted for admission.  Patient is resting comfortably during my examination but reporting right lower quadrant abdominal pain and diarrhea.  Has nausea but denies any vomiting.  Husband at bedside.  PAST MEDICAL HISTORY:   Past Medical History:  Diagnosis Date  . Acute MI, inferoposterior wall ([place]) 09/30/2014  . Claustrophobia   . Coronary artery disease   . Hypercholesteremia   . Hypertension   . MI, old   . 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 [facility]) 10/27/2018    PAST SURGICAL HISTOIRY:   Past Surgical History:  Procedure Laterality Date  . APPENDECTOMY     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  . BLADDER NECK SUSPENSION    . CHOLECYSTECTOMY N/A 08/14/2018   Procedure: LAPAROSCOPIC CHOLECYSTECTOMY;  Surgeon: Jules Husbands, MD;  Location: [facility] ORS;  Service: General;  Laterality: N/A;  . CHOLECYSTECTOMY  11/2018  . CORONARY STENT PLACEMENT    . ENDOBRONCHIAL ULTRASOUND N/A 10/21/2018   Procedure: ENDOBRONCHIAL ULTRASOUND;  Surgeon: Laverle Hobby, MD;  Location: [facility] ORS;  Service: Pulmonary;  Laterality: N/A;  . HERNIA REPAIR    . IR FLUORO GUIDE CV LINE  RIGHT  12/19/2018  . LEFT HEART CATHETERIZATION WITH CORONARY ANGIOGRAM N/A 09/29/2014   Procedure: LEFT HEART CATHETERIZATION WITH CORONARY ANGIOGRAM;  Surgeon: Clent Demark, MD;  Location: [facility] CATH LAB;  Service: Cardiovascular;  Laterality: N/A;  . PORTA CATH INSERTION N/A 01/02/2019   Procedure: PORTA CATH INSERTION;  Surgeon: Algernon Huxley, MD;  Location: [facility] CV LAB;  Service: Cardiovascular;  Laterality: N/A;  . PORTACATH PLACEMENT Right 10/28/2018   Procedure: INSERTION PORT-A-CATH;  Surgeon: Jules Husbands, MD;  Location: [facility] ORS;  Service: General;  Laterality: Right;    SOCIAL HISTORY:   Social History   Tobacco Use  . Smoking status: Former Smoker    Packs/day: 1.00    Years: 39.00    Pack years: 39.00    Types: Cigarettes    Start date: 12/21/1978    Last attempt to quit: 10/16/2018    Years since quitting: 0.2  . Smokeless tobacco: Never Used  Substance Use Topics  . Alcohol use: Yes    Alcohol/week: 0.0 standard drinks    Comment: occassional - approx 1 every 2 weeks     FAMILY HISTORY:   Family History  Problem Relation Age of Onset  . Alzheimer's disease Mother   . Colon cancer Father   . Breast cancer Neg Hx     DRUG ALLERGIES:  No Known Allergies  REVIEW OF SYSTEMS:  CONSTITUTIONAL: No fever, fatigue or weakness.  EYES: No blurred or double vision.  EARS, NOSE, AND THROAT: No tinnitus  or ear pain.  RESPIRATORY: No cough, shortness of breath, wheezing or hemoptysis.  CARDIOVASCULAR: No chest pain, orthopnea, edema.  GASTROINTESTINAL: Reporting nausea,  diarrhea and right lower quadrant abdominal pain.  Denies any vomiting GENITOURINARY: No dysuria, hematuria.  ENDOCRINE: No polyuria, nocturia,  HEMATOLOGY: No anemia, easy bruising or bleeding SKIN: No rash or lesion. MUSCULOSKELETAL: No joint pain or arthritis.   NEUROLOGIC: No tingling, numbness, weakness.  PSYCHIATRY: No anxiety or depression.   MEDICATIONS AT HOME:   Prior to  Admission medications   Medication Sig Start Date End Date Taking? Authorizing Provider  atorvastatin (LIPITOR) 80 MG tablet Take 0.5 tablets (40 mg total) by mouth daily at 6 PM. Patient taking differently: Take 20 mg by mouth 2 (two) times daily.  01/02/16  Yes Plonk, Gwyndolyn Saxon, MD  B Complex Vitamins (VITAMIN B COMPLEX PO) Take 1 tablet by mouth daily.    Yes [provider]  CALCIUM PO Take 1 tablet by mouth daily.    Yes [provider]  Cholecalciferol (VITAMIN D) 2000 units CAPS Take 1 capsule (2,000 Units total) by mouth daily. 01/07/16  Yes Plonk, Gwyndolyn Saxon, MD  HYDROcodone-acetaminophen (NORCO/VICODIN) 5-325 MG tablet Take 1 tablet by mouth every 12 (twelve) hours as needed for moderate pain. 01/16/19  Yes Earlie Server, MD  metoprolol tartrate (LOPRESSOR) 25 MG tablet Take 12.5 mg by mouth 2 (two) times daily.   Yes [provider]  omeprazole (PRILOSEC) 40 MG capsule TAKE 1 CAPSULE(40 MG) BY MOUTH DAILY 11/04/18  Yes Earlie Server, MD  polyethylene glycol [facility] / GLYCOLAX) packet Take 17 g by mouth daily.   Yes [provider]  potassium chloride SA (K-DUR,KLOR-CON) 20 MEQ tablet TAKE 1 TABLET(20 MEQ) BY MOUTH DAILY 11/02/18  Yes Earlie Server, MD  sertraline (ZOLOFT) 50 MG tablet Take 1 tablet (50 mg total) by mouth daily. 11/24/18  Yes Borders, Kirt Boys, NP  sucralfate (CARAFATE) 1 g tablet TAKE 1 TABLET BY MOUTH FOUR TIMES DAILY, WITH MEALS& AT BEDTIME 11/04/18  Yes Earlie Server, MD  albuterol (PROAIR HFA) 108 (90 Base) MCG/ACT inhaler Inhale 2 puffs into the lungs every 6 (six) hours as needed for wheezing or shortness of breath. 09/19/18   Glean Hess, MD  aspirin EC 81 MG tablet Take 81 mg by mouth daily.    [provider]  benzonatate (TESSALON PERLES) 100 MG capsule Take 2 capsules (200 mg total) by mouth 3 (three) times daily. Patient not taking: Reported on 01/24/2019 10/21/18 10/21/19  Laverle Hobby, MD  chlorhexidine (PERIDEX) 0.12 % solution  Use as directed 15 mLs in the mouth or throat 2 (two) times daily. Patient not taking: Reported on 01/24/2019 11/02/18   Earlie Server, MD  dexamethasone (DECADRON) 4 MG tablet Take 35m daily for 2 days after chemtherapy treatments. 11/02/18   YEarlie Server MD  docusate sodium (COLACE) 100 MG capsule Take 100 mg by mouth daily.    [provider]  dronabinol (MARINOL) 5 MG capsule Take 1 capsule (5 mg total) by mouth 2 (two) times daily before a meal. Patient not taking: Reported on 01/24/2019 11/23/18   YEarlie Server MD  Fluticasone-Umeclidin-Vilant (TRELEGY ELLIPTA) 100-62.5-25 MCG/INH AEPB Inhale 1 puff into the lungs daily. Patient not taking: Reported on 01/24/2019 09/19/18   BGlean Hess MD  ipratropium-albuterol (DUONEB) 0.5-2.5 (3) MG/3ML SOLN Take 3 mLs by nebulization 3 (three) times daily. DX. J44.9 09/08/18   RLaverle Hobby MD  lidocaine-prilocaine (EMLA) cream Apply to affected area once 10/27/18  Earlie Server, MD  nitroGLYCERIN (NITROSTAT) 0.4 MG SL tablet Place 1 tablet (0.4 mg total) under the tongue every 5 (five) minutes x 3 doses as needed for chest pain. 10/02/14   Charolette Forward, MD  ondansetron (ZOFRAN) 8 MG tablet Take 1 tablet (8 mg total) by mouth 2 (two) times daily as needed for refractory nausea / vomiting. Start on day 3 after carboplatin chemo. 10/27/18   Earlie Server, MD  prochlorperazine (COMPAZINE) 10 MG tablet TAKE 1 TABLET BY MOUTH EVERY 6 HOURS AS NEEDED FOR NAUSEA OR VOMITING 10/27/18   Earlie Server, MD      VITAL SIGNS:  Blood pressure 102/63, pulse 75, temperature 98.1 F (36.7 C), temperature source Oral, resp. rate 18, height 5' 5" (1.651 m), weight 65.8 kg, SpO2 100 %.  PHYSICAL EXAMINATION:  GENERAL:  73 y.o.-year-old patient lying in the bed with no acute distress.  EYES: Pupils equal, round, reactive to light and accommodation. No scleral icterus. Extraocular muscles intact.  HEENT: Head atraumatic, normocephalic. Oropharynx and nasopharynx clear.  NECK:   Supple, no jugular venous distention. No thyroid enlargement, no tenderness.  LUNGS: Normal breath sounds bilaterally, no wheezing, rales,rhonchi or crepitation. No use of accessory muscles of respiration.  CARDIOVASCULAR: S1, S2 normal. No murmurs, rubs, or gallops.  ABDOMEN: Soft, right lower quadrant is tender, no rebound tenderness nondistended. Bowel sounds present.  EXTREMITIES: No pedal edema, cyanosis, or clubbing.  NEUROLOGIC: Awake, alert and oriented x3. Sensation intact. Gait not checked.  PSYCHIATRIC: The patient is alert and oriented x 3.  SKIN: No obvious rash, lesion, or ulcer.   LABORATORY PANEL:   CBC Recent Labs  Lab 01/24/19 1440  WBC 17.3*  HGB 8.4*  HCT 25.5*  PLT 126*   ------------------------------------------------------------------------------------------------------------------  Chemistries  Recent Labs  Lab 01/24/19 1440  NA 138  K 3.2*  CL 104  CO2 27  GLUCOSE 92  BUN 7*  CREATININE 0.51  CALCIUM 8.5*  AST 16  ALT 15  ALKPHOS 97  BILITOT 0.3   ------------------------------------------------------------------------------------------------------------------  Cardiac Enzymes No results for input(s): TROPONINI in the last 168 hours. ------------------------------------------------------------------------------------------------------------------  RADIOLOGY:  Ct Chest W Contrast  Result Date: 01/24/2019 CLINICAL DATA:  Extensive stage small cell left lung cancer diagnosed 10/21/2018 status post chemotherapy, presenting for restaging. EXAM: CT CHEST, ABDOMEN, AND PELVIS WITH CONTRAST TECHNIQUE: Multidetector CT imaging of the chest, abdomen and pelvis was performed following the standard protocol during bolus administration of intravenous contrast. CONTRAST:  154m ISOVUE-300 IOPAMIDOL (ISOVUE-300) INJECTION 61% COMPARISON:  12/15/2018 CT chest, abdomen and pelvis. 10/26/2018 PET-CT. FINDINGS: CT CHEST FINDINGS Cardiovascular: Normal heart size.  No significant pericardial effusion/thickening. Three-vessel coronary atherosclerosis. Right internal jugular Port-A-Cath terminates in the right atrium. Atherosclerotic nonaneurysmal thoracic aorta. Normal caliber pulmonary arteries. No central pulmonary emboli. Mediastinum/Nodes: No discrete thyroid nodules. Unremarkable esophagus. No axillary adenopathy. Continued reduction in subcarinal adenopathy measuring up to 2.1 cm short axis diameter (series 2/image 27), decreased from 2.8 cm using similar measurement technique. Continued reduction in right paratracheal adenopathy measuring 1.0 cm (series 2/image 23), decreased from 1.3 cm. No new or progressive mediastinal adenopathy. No residual pathologically enlarged hilar nodes. Lungs/Pleura: No pneumothorax. No pleural effusion. Mild centrilobular and paraseptal emphysema. No acute consolidative airspace disease. No residual measurable central left lung mass. Stable parenchymal band in the anterior left lower lobe and lingula compatible with mild nonspecific scarring. Stable tiny calcified granulomas in the right upper lobe. No new significant pulmonary nodules. Musculoskeletal: No aggressive appearing focal osseous lesions. Mild  thoracic spondylosis. CT ABDOMEN PELVIS FINDINGS Hepatobiliary: Normal liver with no liver mass. Cholecystectomy. No biliary ductal dilatation. Pancreas: Normal, with no mass or duct dilation. Spleen: Normal size spleen. Stable granulomatous splenic calcification. No splenic masses. Adrenals/Urinary Tract: Normal adrenals. No hydronephrosis. Two subcentimeter hypodense renal cortical lesions scattered in the right kidney are too small to characterize and were not clearly visualized on the prior CT studies. 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 There is extensive new gas throughout the wall of the bladder. There is nondependent gas layering in the bladder lumen. Underlying mild diffuse bladder wall thickening is  similar to prior. Stomach/Bowel: Normal non-distended stomach. Small periumbilical hernia contains a small bowel loop. No small bowel wall thickening, dilatation, pneumatosis or focal caliber transition. Appendectomy. Normal large bowel with no diverticulosis, large bowel wall thickening or pericolonic fat stranding. Vascular/Lymphatic: Atherosclerotic nonaneurysmal abdominal aorta. Patent portal, splenic, hepatic and renal veins. No pathologically enlarged lymph nodes in the abdomen or pelvis. Reproductive: Grossly normal uterus.  No adnexal mass. Other: No pneumoperitoneum, ascites or focal fluid collection. Musculoskeletal: Stable faint asymmetric sclerosis in posterosuperior right acetabulum at the site of previous hypermetabolism. No new focal osseous lesions. Diffuse osteopenia. IMPRESSION: 1. New extensive gas throughout the bladder wall and within the bladder lumen with underlying mild diffuse bladder wall thickening, compatible with acute emphysematous cystitis. 2. Two new subcentimeter low-attenuation lesions in the right kidney, nonspecific but worrisome for mild acute right pyelonephritis given the above bladder findings. No discrete/drainable renal abscess. No hydronephrosis. 3. Continued positive response to therapy with continued reduction in mediastinal adenopathy. No residual measurable left lung mass. Stable faint sclerosis at the site of the hypermetabolic right acetabular osseous lesion. No new or progressive metastatic disease in the chest, abdomen or pelvis. 4. Small bowel containing small periumbilical hernia without bowel complication at this time. 5. Aortic Atherosclerosis (ICD10-I70.0) and Emphysema (ICD10-J43.9). These results will be called to the ordering clinician or representative by the Radiologist Assistant, and communication documented in the PACS or zVision Dashboard. Electronically Signed   By: Ilona Sorrel M.D.   On: 01/24/2019 11:22   Ct Abdomen Pelvis W Contrast  Result  Date: 01/24/2019 CLINICAL DATA:  Extensive stage small cell left lung cancer diagnosed 10/21/2018 status post chemotherapy, presenting for restaging. EXAM: CT CHEST, ABDOMEN, AND PELVIS WITH CONTRAST TECHNIQUE: Multidetector CT imaging of the chest, abdomen and pelvis was performed following the standard protocol during bolus administration of intravenous contrast. CONTRAST:  138m ISOVUE-300 IOPAMIDOL (ISOVUE-300) INJECTION 61% COMPARISON:  12/15/2018 CT chest, abdomen and pelvis. 10/26/2018 PET-CT. FINDINGS: CT CHEST FINDINGS Cardiovascular: Normal heart size. No significant pericardial effusion/thickening. Three-vessel coronary atherosclerosis. Right internal jugular Port-A-Cath terminates in the right atrium. Atherosclerotic nonaneurysmal thoracic aorta. Normal caliber pulmonary arteries. No central pulmonary emboli. Mediastinum/Nodes: No discrete thyroid nodules. Unremarkable esophagus. No axillary adenopathy. Continued reduction in subcarinal adenopathy measuring up to 2.1 cm short axis diameter (series 2/image 27), decreased from 2.8 cm using similar measurement technique. Continued reduction in right paratracheal adenopathy measuring 1.0 cm (series 2/image 23), decreased from 1.3 cm. No new or progressive mediastinal adenopathy. No residual pathologically enlarged hilar nodes. Lungs/Pleura: No pneumothorax. No pleural effusion. Mild centrilobular and paraseptal emphysema. No acute consolidative airspace disease. No residual measurable central left lung mass. Stable parenchymal band in the anterior left lower lobe and lingula compatible with mild nonspecific scarring. Stable tiny calcified granulomas in the right upper lobe. No new significant pulmonary nodules. Musculoskeletal: No aggressive appearing focal osseous lesions.  Mild thoracic spondylosis. CT ABDOMEN PELVIS FINDINGS Hepatobiliary: Normal liver with no liver mass. Cholecystectomy. No biliary ductal dilatation. Pancreas: Normal, with no mass or duct  dilation. Spleen: Normal size spleen. Stable granulomatous splenic calcification. No splenic masses. Adrenals/Urinary Tract: Normal adrenals. No hydronephrosis. Two subcentimeter hypodense renal cortical lesions scattered in the right kidney are too small to characterize and were not clearly visualized on the prior CT studies. 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 There is extensive new gas throughout the wall of the bladder. There is nondependent gas layering in the bladder lumen. Underlying mild diffuse bladder wall thickening is similar to prior. Stomach/Bowel: Normal non-distended stomach. Small periumbilical hernia contains a small bowel loop. No small bowel wall thickening, dilatation, pneumatosis or focal caliber transition. Appendectomy. Normal large bowel with no diverticulosis, large bowel wall thickening or pericolonic fat stranding. Vascular/Lymphatic: Atherosclerotic nonaneurysmal abdominal aorta. Patent portal, splenic, hepatic and renal veins. No pathologically enlarged lymph nodes in the abdomen or pelvis. Reproductive: Grossly normal uterus.  No adnexal mass. Other: No pneumoperitoneum, ascites or focal fluid collection. Musculoskeletal: Stable faint asymmetric sclerosis in posterosuperior right acetabulum at the site of previous hypermetabolism. No new focal osseous lesions. Diffuse osteopenia. IMPRESSION: 1. New extensive gas throughout the bladder wall and within the bladder lumen with underlying mild diffuse bladder wall thickening, compatible with acute emphysematous cystitis. 2. Two new subcentimeter low-attenuation lesions in the right kidney, nonspecific but worrisome for mild acute right pyelonephritis given the above bladder findings. No discrete/drainable renal abscess. No hydronephrosis. 3. Continued positive response to therapy with continued reduction in mediastinal adenopathy. No residual measurable left lung mass. Stable faint sclerosis at the site of the  hypermetabolic right acetabular osseous lesion. No new or progressive metastatic disease in the chest, abdomen or pelvis. 4. Small bowel containing small periumbilical hernia without bowel complication at this time. 5. Aortic Atherosclerosis (ICD10-I70.0) and Emphysema (ICD10-J43.9). These results will be called to the ordering clinician or representative by the Radiologist Assistant, and communication documented in the PACS or zVision Dashboard. Electronically Signed   By: Ilona Sorrel M.D.   On: 01/24/2019 11:22    EKG:   Orders placed or performed during the hospital encounter of 08/13/18  . ED EKG  . ED EKG  . EKG    IMPRESSION AND PLAN:    #Sepsis secondary to acute pyelonephritis Admit to MedSurg unit Patient met septic criteria with leukocytosis, hypotension at the time of admission IV antibiotics cefepime x1 was given in the ED, will continue IV antibiotics Hydration with IV fluids Monitor procalcitonin and lactic acid level Check cultures  #Acute emphysematous pyelonephritis IV cefepime x1 was given in the ED will start the patient on IV meropenem for broad-spectrum coverage as the patient is immunocompromised Aggressive hydration with IV fluids Follow-up on the urine culture and sensitivity Urology consult placed and discussed with Dr. Bernardo Heater , who is recommending broad-spectrum antibiotic coverage at this time  #Hypokalemia replete and recheck in a.m.  #Diarrhea-intermittent We will check GI panel, stool for C. difficile toxin  #Essential hypertension Currently hypotensive hold blood pressure medications  #Chronic pain syndrome Norco as needed  #Stage IV small cell lung cancer-on chemotherapy Follow-up with Dr. Tasia Catchings as an outpatient after discharge  DVT prophylaxis with Lovenox subcu    All the records are reviewed and case discussed with ED provider. Management plans discussed with the patient, family and they are in agreement.  CODE STATUS: dnr  TOTAL  TIME TAKING CARE OF  THIS PATIENT: 43  minutes.   Note: This dictation was prepared with Dragon dictation along with smaller phrase technology. Any transcriptional errors that result from this process are unintentional.  Nicholes Mango M.D on 01/24/2019 at 4:52 PM  Between 7am to 6pm - Pager - [phone]  After 6pm go to [email or web address] - password [email or web address] [facility]  [facility]  Office  [phone]  CC: Primary care physician; Glean Hess, MD

## 2019-01-24 NOTE — ED Notes (Signed)
Pt sent by her doctor for possible cystitis and pyelonephritis that showed up on outpatient CT.

## 2019-01-24 NOTE — Telephone Encounter (Signed)
Report called to ER Triage nurse

## 2019-01-24 NOTE — Consult Note (Signed)
Urology Consult  Chief Complaint: Sent to ER  History of Present Illness: Kristi [name] is a 73 y.o. year old female seen in consultation at the request of Dr. 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  She had a CT of the chest, abdomen and pelvis performed today and was contacted by the oncology clinic recommending ED evaluation based on findings of air in the bladder and possible pyelonephritis.  She has been asymptomatic and denied urinary frequency, urgency, dysuria.  She has noted pneumaturia.  After arriving to the ED she was complaining of mild right lower quadrant abdominal pain.  She denies previous history of urologic problems or prior urologic evaluation.  Her CT showed extensive gas within the wall and lumen of the bladder.  She had 2 subcentimeter hypodensities in the right kidney.  Urinalysis did show greater than 50 WBC.  Past Medical History:  Diagnosis Date  . Acute MI, inferoposterior wall ([place]) 09/30/2014  . Claustrophobia   . Coronary artery disease   . Hypercholesteremia   . Hypertension   . MI, old   . 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 [facility]) 10/27/2018    Past Surgical History:  Procedure Laterality Date  . APPENDECTOMY     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  . BLADDER NECK SUSPENSION    . CHOLECYSTECTOMY N/A 08/14/2018   Procedure: LAPAROSCOPIC CHOLECYSTECTOMY;  Surgeon: Jules Husbands, MD;  Location: [facility] ORS;  Service: General;  Laterality: N/A;  . CHOLECYSTECTOMY  11/2018  . CORONARY STENT PLACEMENT    . ENDOBRONCHIAL ULTRASOUND N/A 10/21/2018   Procedure: ENDOBRONCHIAL ULTRASOUND;  Surgeon: Laverle Hobby, MD;  Location: [facility] ORS;  Service: Pulmonary;  Laterality: N/A;  . HERNIA REPAIR    . IR FLUORO GUIDE CV LINE RIGHT  12/19/2018  . LEFT HEART CATHETERIZATION WITH CORONARY ANGIOGRAM N/A 09/29/2014   Procedure: LEFT HEART  CATHETERIZATION WITH CORONARY ANGIOGRAM;  Surgeon: Clent Demark, MD;  Location: [facility] CATH LAB;  Service: Cardiovascular;  Laterality: N/A;  . PORTA CATH INSERTION N/A 01/02/2019   Procedure: PORTA CATH INSERTION;  Surgeon: Algernon Huxley, MD;  Location: [place] CV LAB;  Service: Cardiovascular;  Laterality: N/A;  . PORTACATH PLACEMENT Right 10/28/2018   Procedure: INSERTION PORT-A-CATH;  Surgeon: Jules Husbands, MD;  Location: [facility] ORS;  Service: General;  Laterality: Right;    Home Medications:  Current Meds  Medication Sig  . atorvastatin (LIPITOR) 80 MG tablet Take 0.5 tablets (40 mg total) by mouth daily at 6 PM. (Patient taking differently: Take 20 mg by mouth 2 (two) times daily. )  . B Complex Vitamins (VITAMIN B COMPLEX PO) Take 1 tablet by mouth daily.   [name] Kitchen CALCIUM PO Take 1 tablet by mouth daily.   . Cholecalciferol (VITAMIN D) 2000 units CAPS Take 1 capsule (2,000 Units total) by mouth daily.  [name] Kitchen HYDROcodone-acetaminophen (NORCO/VICODIN) 5-325 MG tablet Take 1 tablet by mouth every 12 (twelve) hours as needed for moderate pain.  . metoprolol tartrate (LOPRESSOR) 25 MG tablet Take 12.5 mg by mouth 2 (two) times daily.  [name] Kitchen omeprazole (PRILOSEC) 40 MG capsule TAKE 1 CAPSULE(40 MG) BY MOUTH DAILY  . polyethylene glycol (MIRALAX / GLYCOLAX) packet Take 17 g by mouth daily.  . potassium chloride SA (K-DUR,KLOR-CON) 20 MEQ tablet TAKE 1 TABLET(20 MEQ) BY MOUTH DAILY  . sertraline (ZOLOFT) 50 MG tablet Take 1  tablet (50 mg total) by mouth daily.  . sucralfate (CARAFATE) 1 g tablet TAKE 1 TABLET BY MOUTH FOUR TIMES DAILY, WITH MEALS& AT BEDTIME    Allergies: No Known Allergies  Family History  Problem Relation Age of Onset  . Alzheimer's disease Mother   . Colon cancer Father   . Breast cancer Neg Hx     Social History:  reports that she quit smoking about 3 months ago. Her smoking use included cigarettes. She started smoking about 40 years ago. She has a 39.00 pack-year smoking  history. She has never used smokeless tobacco. She reports current alcohol use. She reports that she does not use drugs.  ROS: A complete review of systems was performed.  All systems are negative except for pertinent findings as noted.  Physical Exam:  Vital signs in last 24 hours: Temp:  [98.1 F (36.7 C)] 98.1 F (36.7 C) (02/04 1428) Pulse Rate:  [75-88] 82 (02/04 1700) Resp:  [18] 18 (02/04 1428) BP: (93-105)/(55-72) 105/72 (02/04 1700) SpO2:  [98 %-100 %] 100 % (02/04 1700) Weight:  [65.8 kg] 65.8 kg (02/04 1429) Constitutional:  Alert and oriented, No acute distress HEENT: [place] AT, moist mucus membranes.  Trachea midline, no masses GI: Abdomen is soft, nontender, nondistended, no abdominal masses GU: No CVA tenderness Skin: No rashes, bruises or suspicious lesions Lymph: No cervical or inguinal adenopathy Neurologic: Grossly intact, no focal deficits, moving all 4 extremities Psychiatric: Normal mood and affect   Laboratory Data:  Recent Labs    01/24/19 1440  WBC 17.3*  HGB 8.4*  HCT 25.5*   Recent Labs    01/24/19 1440  NA 138  K 3.2*  CL 104  CO2 27  GLUCOSE 92  BUN 7*  CREATININE 0.51  CALCIUM 8.5*    Radiologic Imaging: CT abdomen/pelvis images were personally reviewed Ct Chest W Contrast  Result Date: 01/24/2019 CLINICAL DATA:  Extensive stage small cell left lung cancer diagnosed 10/21/2018 status post chemotherapy, presenting for restaging. EXAM: CT CHEST, ABDOMEN, AND PELVIS WITH CONTRAST TECHNIQUE: Multidetector CT imaging of the chest, abdomen and pelvis was performed following the standard protocol during bolus administration of intravenous contrast. CONTRAST:  157mL ISOVUE-300 IOPAMIDOL (ISOVUE-300) INJECTION 61% COMPARISON:  12/15/2018 CT chest, abdomen and pelvis. 10/26/2018 PET-CT. FINDINGS: CT CHEST FINDINGS Cardiovascular: Normal heart size. No significant pericardial effusion/thickening. Three-vessel coronary atherosclerosis. Right internal  jugular Port-A-Cath terminates in the right atrium. Atherosclerotic nonaneurysmal thoracic aorta. Normal caliber pulmonary arteries. No central pulmonary emboli. Mediastinum/Nodes: No discrete thyroid nodules. Unremarkable esophagus. No axillary adenopathy. Continued reduction in subcarinal adenopathy measuring up to 2.1 cm short axis diameter (series 2/image 27), decreased from 2.8 cm using similar measurement technique. Continued reduction in right paratracheal adenopathy measuring 1.0 cm (series 2/image 23), decreased from 1.3 cm. No new or progressive mediastinal adenopathy. No residual pathologically enlarged hilar nodes. Lungs/Pleura: No pneumothorax. No pleural effusion. Mild centrilobular and paraseptal emphysema. No acute consolidative airspace disease. No residual measurable central left lung mass. Stable parenchymal band in the anterior left lower lobe and lingula compatible with mild nonspecific scarring. Stable tiny calcified granulomas in the right upper lobe. No new significant pulmonary nodules. Musculoskeletal: No aggressive appearing focal osseous lesions. Mild thoracic spondylosis. CT ABDOMEN PELVIS FINDINGS Hepatobiliary: Normal liver with no liver mass. Cholecystectomy. No biliary ductal dilatation. Pancreas: Normal, with no mass or duct dilation. Spleen: Normal size spleen. Stable granulomatous splenic calcification. No splenic masses. Adrenals/Urinary Tract: Normal adrenals. No hydronephrosis. Two subcentimeter hypodense renal cortical lesions  scattered in the right kidney are too small to characterize and were not clearly visualized on the prior CT studies. 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 There is extensive new gas throughout the wall of the bladder. There is nondependent gas layering in the bladder lumen. Underlying mild diffuse bladder wall thickening is similar to prior. Stomach/Bowel: Normal non-distended stomach. Small periumbilical hernia contains a  small bowel loop. No small bowel wall thickening, dilatation, pneumatosis or focal caliber transition. Appendectomy. Normal large bowel with no diverticulosis, large bowel wall thickening or pericolonic fat stranding. Vascular/Lymphatic: Atherosclerotic nonaneurysmal abdominal aorta. Patent portal, splenic, hepatic and renal veins. No pathologically enlarged lymph nodes in the abdomen or pelvis. Reproductive: Grossly normal uterus.  No adnexal mass. Other: No pneumoperitoneum, ascites or focal fluid collection. Musculoskeletal: Stable faint asymmetric sclerosis in posterosuperior right acetabulum at the site of previous hypermetabolism. No new focal osseous lesions. Diffuse osteopenia. IMPRESSION: 1. New extensive gas throughout the bladder wall and within the bladder lumen with underlying mild diffuse bladder wall thickening, compatible with acute emphysematous cystitis. 2. Two new subcentimeter low-attenuation lesions in the right kidney, nonspecific but worrisome for mild acute right pyelonephritis given the above bladder findings. No discrete/drainable renal abscess. No hydronephrosis. 3. Continued positive response to therapy with continued reduction in mediastinal adenopathy. No residual measurable left lung mass. Stable faint sclerosis at the site of the hypermetabolic right acetabular osseous lesion. No new or progressive metastatic disease in the chest, abdomen or pelvis. 4. Small bowel containing small periumbilical hernia without bowel complication at this time. 5. Aortic Atherosclerosis (ICD10-I70.0) and Emphysema (ICD10-J43.9). These results will be called to the ordering clinician or representative by the Radiologist Assistant, and communication documented in the PACS or zVision Dashboard. Electronically Signed   By: Ilona Sorrel M.D.   On: 01/24/2019 11:22   Ct Abdomen Pelvis W Contrast  Result Date: 01/24/2019 CLINICAL DATA:  Extensive stage small cell left lung cancer diagnosed 10/21/2018 status  post chemotherapy, presenting for restaging. EXAM: CT CHEST, ABDOMEN, AND PELVIS WITH CONTRAST TECHNIQUE: Multidetector CT imaging of the chest, abdomen and pelvis was performed following the standard protocol during bolus administration of intravenous contrast. CONTRAST:  143mL ISOVUE-300 IOPAMIDOL (ISOVUE-300) INJECTION 61% COMPARISON:  12/15/2018 CT chest, abdomen and pelvis. 10/26/2018 PET-CT. FINDINGS: CT CHEST FINDINGS Cardiovascular: Normal heart size. No significant pericardial effusion/thickening. Three-vessel coronary atherosclerosis. Right internal jugular Port-A-Cath terminates in the right atrium. Atherosclerotic nonaneurysmal thoracic aorta. Normal caliber pulmonary arteries. No central pulmonary emboli. Mediastinum/Nodes: No discrete thyroid nodules. Unremarkable esophagus. No axillary adenopathy. Continued reduction in subcarinal adenopathy measuring up to 2.1 cm short axis diameter (series 2/image 27), decreased from 2.8 cm using similar measurement technique. Continued reduction in right paratracheal adenopathy measuring 1.0 cm (series 2/image 23), decreased from 1.3 cm. No new or progressive mediastinal adenopathy. No residual pathologically enlarged hilar nodes. Lungs/Pleura: No pneumothorax. No pleural effusion. Mild centrilobular and paraseptal emphysema. No acute consolidative airspace disease. No residual measurable central left lung mass. Stable parenchymal band in the anterior left lower lobe and lingula compatible with mild nonspecific scarring. Stable tiny calcified granulomas in the right upper lobe. No new significant pulmonary nodules. Musculoskeletal: No aggressive appearing focal osseous lesions. Mild thoracic spondylosis. CT ABDOMEN PELVIS FINDINGS Hepatobiliary: Normal liver with no liver mass. Cholecystectomy. No biliary ductal dilatation. Pancreas: Normal, with no mass or duct dilation. Spleen: Normal size spleen. Stable granulomatous splenic calcification. No splenic masses.  Adrenals/Urinary Tract: Normal adrenals. No hydronephrosis. Two subcentimeter hypodense renal  cortical lesions scattered in the right kidney are too small to characterize and were not clearly visualized on the prior CT studies. 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 There is extensive new gas throughout the wall of the bladder. There is nondependent gas layering in the bladder lumen. Underlying mild diffuse bladder wall thickening is similar to prior. Stomach/Bowel: Normal non-distended stomach. Small periumbilical hernia contains a small bowel loop. No small bowel wall thickening, dilatation, pneumatosis or focal caliber transition. Appendectomy. Normal large bowel with no diverticulosis, large bowel wall thickening or pericolonic fat stranding. Vascular/Lymphatic: Atherosclerotic nonaneurysmal abdominal aorta. Patent portal, splenic, hepatic and renal veins. No pathologically enlarged lymph nodes in the abdomen or pelvis. Reproductive: Grossly normal uterus.  No adnexal mass. Other: No pneumoperitoneum, ascites or focal fluid collection. Musculoskeletal: Stable faint asymmetric sclerosis in posterosuperior right acetabulum at the site of previous hypermetabolism. No new focal osseous lesions. Diffuse osteopenia. IMPRESSION: 1. New extensive gas throughout the bladder wall and within the bladder lumen with underlying mild diffuse bladder wall thickening, compatible with acute emphysematous cystitis. 2. Two new subcentimeter low-attenuation lesions in the right kidney, nonspecific but worrisome for mild acute right pyelonephritis given the above bladder findings. No discrete/drainable renal abscess. No hydronephrosis. 3. Continued positive response to therapy with continued reduction in mediastinal adenopathy. No residual measurable left lung mass. Stable faint sclerosis at the site of the hypermetabolic right acetabular osseous lesion. No new or progressive metastatic disease in the  chest, abdomen or pelvis. 4. Small bowel containing small periumbilical hernia without bowel complication at this time. 5. Aortic Atherosclerosis (ICD10-I70.0) and Emphysema (ICD10-J43.9). These results will be called to the ordering clinician or representative by the Radiologist Assistant, and communication documented in the PACS or zVision Dashboard. Electronically Signed   By: Ilona Sorrel M.D.   On: 01/24/2019 11:22    Impression/Assessment:  73 year old female with CT findings consistent with emphysematous cystitis.  There is no air within the upper tract.  She is asymptomatic and clinically has no evidence of pyelonephritis.  Recommendation:  -Although asymptomatic she is immunocompromised and does have leukocytosis.  Agree with antibiotic therapy.  -The most common organisms are Klebsiella, E. coli and Proteus and her current antibiotic coverage should be adequate.  Urine culture is pending.  -Recommend Foley catheter drainage  01/24/2019, 7:15 PM  John Giovanni,  MD

## 2019-01-24 NOTE — Progress Notes (Signed)
Pharmacy Antibiotic Note  Kristi [name] is a 73 y.o. female admitted on 01/24/2019 with sepsis.  Pharmacy has been consulted for meropenem dosing.  Plan: Will start meropenem 1 g q8H. Dr. Margaretmary Eddy discussed with urology and they recommended broader coverage abx.   Height: 5\' 5"  (165.1 cm) Weight: 145 lb (65.8 kg) IBW/kg (Calculated) : 57  Temp (24hrs), Avg:98.1 F (36.7 C), Min:98.1 F (36.7 C), Max:98.1 F (36.7 C)  Recent Labs  Lab 01/24/19 1440 01/24/19 1602  WBC 17.3*  --   CREATININE 0.51  --   LATICACIDVEN  --  1.3    Estimated Creatinine Clearance: 57.2 mL/min (by C-G formula based on SCr of 0.51 mg/dL).    No Known Allergies  Antimicrobials this admission: 2/4 cefepime >> 2/4 2/4 meropenem >>   Dose adjustments this admission: none  Microbiology results: 2/4 BCx: pending x 2 2/4 UCx: pending    Thank you for allowing pharmacy to be a part of this patient's care.  Oswald Hillock, PharmD, BCPS Clinical Pharmacist 01/24/2019 5:02 PM

## 2019-01-24 NOTE — ED Provider Notes (Signed)
[facility] Emergency Department Provider Note  ____________________________________________   First MD Initiated Contact with Patient 01/24/19 1505     (approximate)  I have reviewed the triage vital signs and the nursing notes.   HISTORY  Chief Complaint abnormal CT   HPI Kristi [name] is a 73 y.o. 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  Patient had a CAT scan earlier today that showed emphysematous bladder wall as well as a thickening in her bladder with associated findings of pyelonephritis to the right kidney.  The patient is denying any abdominal pain, burning with urination, flank pain, fever or chills.    Past Medical History:  Diagnosis Date  . Acute MI, inferoposterior wall ([place]) 09/30/2014  . Claustrophobia   . Coronary artery disease   . Hypercholesteremia   . Hypertension   . MI, old   . 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 [facility]) 10/27/2018    Patient Active Problem List   Diagnosis Date Noted  . Anemia due to antineoplastic chemotherapy 01/09/2019  . Encounter for antineoplastic chemotherapy 11/02/2018  . Cough 10/28/2018  . Dysphagia 10/28/2018  . Decrease in appetite 10/28/2018  . Goals of care, counseling/discussion 10/28/2018  . Small cell lung cancer ([place]) 10/27/2018  . Grade I diastolic dysfunction 78/67/6720  . Localized edema 08/29/2018  . Cholecystitis 08/13/2018  . COPD with exacerbation ([place]) 08/11/2018  . Osteopenia determined by x-ray 05/19/2018  . Prediabetes 02/08/2018  . Hx of fracture of radius 08/18/2016  . Vitamin D deficiency 01/07/2016  . Hyperlipidemia 01/02/2016  . Hypertension 01/02/2016  . Chronic pain 01/02/2016  . Obesity (BMI 30.0-34.9) 01/02/2016  . Coronary artery disease with stable angina pectoris ([place]) 01/02/2016  . IBS (irritable bowel syndrome)  01/02/2016  . FH: colon cancer 01/02/2016  . DDD (degenerative disc disease), cervical 01/02/2016  . Primary osteoarthritis of left knee 01/02/2016  . Tobacco use disorder 01/02/2016    Past Surgical History:  Procedure Laterality Date  . APPENDECTOMY     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  . BLADDER NECK SUSPENSION    . CHOLECYSTECTOMY N/A 08/14/2018   Procedure: LAPAROSCOPIC CHOLECYSTECTOMY;  Surgeon: Jules Husbands, MD;  Location: [facility] ORS;  Service: General;  Laterality: N/A;  . CHOLECYSTECTOMY  11/2018  . CORONARY STENT PLACEMENT    . ENDOBRONCHIAL ULTRASOUND N/A 10/21/2018   Procedure: ENDOBRONCHIAL ULTRASOUND;  Surgeon: Laverle Hobby, MD;  Location: [facility] ORS;  Service: Pulmonary;  Laterality: N/A;  . HERNIA REPAIR    . IR FLUORO GUIDE CV LINE RIGHT  12/19/2018  . LEFT HEART CATHETERIZATION WITH CORONARY ANGIOGRAM N/A 09/29/2014   Procedure: LEFT HEART CATHETERIZATION WITH CORONARY ANGIOGRAM;  Surgeon: Clent Demark, MD;  Location: [facility] CATH LAB;  Service: Cardiovascular;  Laterality: N/A;  . PORTA CATH INSERTION N/A 01/02/2019   Procedure: PORTA CATH INSERTION;  Surgeon: Algernon Huxley, MD;  Location: [facility] CV LAB;  Service: Cardiovascular;  Laterality: N/A;  . PORTACATH PLACEMENT Right 10/28/2018   Procedure: INSERTION PORT-A-CATH;  Surgeon: Jules Husbands, MD;  Location: [facility] ORS;  Service: General;  Laterality: Right;    Prior to Admission medications   Medication Sig Start Date End Date Taking? Authorizing Provider  albuterol (PROAIR HFA) 108 (90 Base) MCG/ACT inhaler Inhale 2 puffs into the lungs every 6 (six) hours as needed for wheezing or shortness of  breath. 09/19/18   Glean Hess, MD  aspirin EC 81 MG tablet Take 81 mg by mouth daily.    [provider]  atorvastatin (LIPITOR) 80 MG tablet Take 0.5 tablets (40 mg total) by mouth daily at 6 PM. Patient taking differently: Take 80 mg by mouth at bedtime.  01/02/16   Plonk, Gwyndolyn Saxon, MD  B Complex  Vitamins (VITAMIN B COMPLEX PO) Take 1 tablet by mouth daily.     [provider]  benzonatate (TESSALON PERLES) 100 MG capsule Take 2 capsules (200 mg total) by mouth 3 (three) times daily. 10/21/18 10/21/19  Laverle Hobby, MD  CALCIUM PO Take 1 tablet by mouth daily.     [provider]  cetirizine (ZYRTEC) 10 MG tablet Take 10 mg by mouth daily.    [provider]  chlorhexidine (PERIDEX) 0.12 % solution Use as directed 15 mLs in the mouth or throat 2 (two) times daily. 11/02/18   Earlie Server, MD  Cholecalciferol (VITAMIN D) 2000 units CAPS Take 1 capsule (2,000 Units total) by mouth daily. 01/07/16   Plonk, Gwyndolyn Saxon, MD  dexamethasone (DECADRON) 4 MG tablet Take 8mg  daily for 2 days after chemtherapy treatments. 11/02/18   Earlie Server, MD  docusate sodium (COLACE) 100 MG capsule Take 100 mg by mouth daily.    [provider]  dronabinol (MARINOL) 5 MG capsule Take 1 capsule (5 mg total) by mouth 2 (two) times daily before a meal. 11/23/18   Earlie Server, MD  Fluticasone-Umeclidin-Vilant (TRELEGY ELLIPTA) 100-62.5-25 MCG/INH AEPB Inhale 1 puff into the lungs daily. 09/19/18   Glean Hess, MD  HYDROcodone-acetaminophen (NORCO/VICODIN) 5-325 MG tablet Take 1 tablet by mouth every 12 (twelve) hours as needed for moderate pain. 01/16/19   Earlie Server, MD  ipratropium-albuterol (DUONEB) 0.5-2.5 (3) MG/3ML SOLN Take 3 mLs by nebulization 3 (three) times daily. DX. J44.9 09/08/18   Laverle Hobby, MD  lidocaine-prilocaine (EMLA) cream Apply to affected area once 10/27/18   Earlie Server, MD  metoprolol tartrate (LOPRESSOR) 25 MG tablet Take 12.5 mg by mouth 2 (two) times daily.    [provider]  nitroGLYCERIN (NITROSTAT) 0.4 MG SL tablet Place 1 tablet (0.4 mg total) under the tongue every 5 (five) minutes x 3 doses as needed for chest pain. 10/02/14   Charolette Forward, MD  omeprazole (PRILOSEC) 40 MG capsule TAKE 1 CAPSULE(40 MG) BY MOUTH DAILY 11/04/18   Earlie Server,  MD  ondansetron (ZOFRAN) 8 MG tablet Take 1 tablet (8 mg total) by mouth 2 (two) times daily as needed for refractory nausea / vomiting. Start on day 3 after carboplatin chemo. 10/27/18   Earlie Server, MD  potassium chloride SA (K-DUR,KLOR-CON) 20 MEQ tablet TAKE 1 TABLET(20 MEQ) BY MOUTH DAILY 11/02/18   Earlie Server, MD  prochlorperazine (COMPAZINE) 10 MG tablet TAKE 1 TABLET BY MOUTH EVERY 6 HOURS AS NEEDED FOR NAUSEA OR VOMITING 10/27/18   Earlie Server, MD  sertraline (ZOLOFT) 50 MG tablet Take 1 tablet (50 mg total) by mouth daily. 11/24/18   Borders, Kirt Boys, NP  sucralfate (CARAFATE) 1 g tablet TAKE 1 TABLET BY MOUTH FOUR TIMES DAILY, WITH MEALS& AT BEDTIME 11/04/18   Earlie Server, MD    Allergies Patient has no known allergies.  Family History  Problem Relation Age of Onset  . Alzheimer's disease Mother   . Colon cancer Father   . Breast cancer Neg Hx     Social History Social History   Tobacco Use  .  Smoking status: Former Smoker    Packs/day: 1.00    Years: 39.00    Pack years: 39.00    Types: Cigarettes    Start date: 12/21/1978    Last attempt to quit: 10/16/2018    Years since quitting: 0.2  . Smokeless tobacco: Never Used  Substance Use Topics  . Alcohol use: Yes    Alcohol/week: 0.0 standard drinks    Comment: occassional - approx 1 every 2 weeks   . Drug use: No    Review of Systems  Constitutional: No fever/chills Eyes: No visual changes. ENT: No sore throat. Cardiovascular: Denies chest pain. Respiratory: Denies shortness of breath. Gastrointestinal:   No nausea, no vomiting.  No diarrhea.  No constipation. Genitourinary: Negative for dysuria. Musculoskeletal: Negative for back pain. Skin: Negative for rash. Neurological: Negative for headaches, focal weakness or numbness.   ____________________________________________   PHYSICAL EXAM:  VITAL SIGNS: ED Triage Vitals  Enc Vitals Group     BP 01/24/19 1428 (!) 93/55     Pulse Rate 01/24/19 1428 88     Resp  01/24/19 1428 18     Temp 01/24/19 1428 98.1 F (36.7 C)     Temp Source 01/24/19 1428 Oral     SpO2 01/24/19 1428 98 %     Weight 01/24/19 1429 145 lb (65.8 kg)     Height 01/24/19 1429 5\' 5"  (1.651 m)     Head Circumference --      Peak Flow --      Pain Score 01/24/19 1428 0     Pain Loc --      Pain Edu? --      Excl. in [place]? --     Constitutional: Alert and oriented. Well appearing and in no acute distress. Eyes: Conjunctivae are normal.  Head: Atraumatic. Nose: No congestion/rhinnorhea. Mouth/Throat: Mucous membranes are moist.  Neck: No stridor.   Cardiovascular: Normal rate, regular rhythm. Grossly normal heart sounds.  Respiratory: Normal respiratory effort.  No retractions. Lungs CTAB. Gastrointestinal: Soft and nontender. No distention. No CVA tenderness. Musculoskeletal: No lower extremity tenderness nor edema.  No joint effusions. Neurologic:  Normal speech and language. No gross focal neurologic deficits are appreciated. Skin:  Skin is warm, dry and intact. No rash noted. Psychiatric: Mood and affect are normal. Speech and behavior are normal.  ____________________________________________   LABS (all labs ordered are listed, but only abnormal results are displayed)  Labs Reviewed  COMPREHENSIVE METABOLIC PANEL - Abnormal; Notable for the following components:      Result Value   Potassium 3.2 (*)    BUN 7 (*)    Calcium 8.5 (*)    Albumin 3.3 (*)    All other components within normal limits  CBC WITH DIFFERENTIAL/PLATELET - Abnormal; Notable for the following components:   WBC 17.3 (*)    RBC 2.61 (*)    Hemoglobin 8.4 (*)    HCT 25.5 (*)    RDW 19.7 (*)    Platelets 126 (*)    Neutro Abs 11.3 (*)    Monocytes Absolute 1.4 (*)    Abs Immature Granulocytes 2.04 (*)    All other components within normal limits  URINALYSIS, COMPLETE (UACMP) WITH MICROSCOPIC - Abnormal; Notable for the following components:   Color, Urine YELLOW (*)    APPearance CLOUDY  (*)    Hgb urine dipstick SMALL (*)    Leukocytes, UA LARGE (*)    WBC, UA >50 (*)    Bacteria, UA RARE (*)  Non Squamous Epithelial PRESENT (*)    All other components within normal limits  URINE CULTURE  CULTURE, BLOOD (SINGLE)  CULTURE, BLOOD (ROUTINE X 2)  CULTURE, BLOOD (ROUTINE X 2)  PATHOLOGIST SMEAR REVIEW  LACTIC ACID, PLASMA  LACTIC ACID, PLASMA   ____________________________________________  EKG   ____________________________________________  RADIOLOGY  CT with bladder wall thickening with an emphysematous bladder wall as well as findings of right-sided pyelonephritis.  Decreased cancer burden.    ____________________________________________   PROCEDURES  Procedure(s) performed:   Procedures  Critical Care performed:   ____________________________________________   INITIAL IMPRESSION / ASSESSMENT AND PLAN / ED COURSE  Pertinent labs & imaging results that were available during my care of the patient were reviewed by me and considered in my medical decision making (see chart for details).  DDX: Sepsis, UTI, pyelonephritis, emphysematous bladder As part of my medical decision making, I reviewed the following data within the electronic MEDICAL RECORD NUMBER Notes from prior outpatient visits  ----------------------------------------- 4:04 PM on 01/24/2019 -----------------------------------------  Patient with elevated white blood cell count.  Discussed the case Dr. Tasia Catchings, her oncologist, who says that she is on Neulasta.  However, the urine looks infected.  Unclear why the patient is asymptomatic but all lab and CT findings for the patient having severe bladder infection/pyelonephritis.  Patient started on cefepime.  Sepsis protocol initiated.  Patient to be admitted to the hospital.  Patient as well as found understanding and willing to comply.  Signed out to Dr. Margaretmary Eddy.   ____________________________________________   FINAL CLINICAL IMPRESSION(S) / ED  DIAGNOSES  Pyelonephritis.  Emphysematous cystitis.  NEW MEDICATIONS STARTED DURING THIS VISIT:  New Prescriptions   No medications on file     Note:  This document was prepared using Dragon voice recognition software and may include unintentional dictation errors.     Orbie Pyo, MD 01/24/19 [phone]

## 2019-01-24 NOTE — ED Notes (Signed)
Admitting MD at bedside.

## 2019-01-24 NOTE — Telephone Encounter (Signed)
Left vm on pts cell and on husband, Marden Noble, cell to return our call

## 2019-01-24 NOTE — ED Triage Notes (Signed)
Pt states she had a routine follow up CT for her lung CA today and was called to come to the ED for possible cystitis and pyelonephritis pt denies any urinary sx, denies any abd pain. States she has had diarrhea for the past 3 days.

## 2019-01-24 NOTE — Progress Notes (Signed)
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  Patient has received IV cefepime.  We will start her on IV meropenem for broader coverage.  Treatment plan of care discussed in detail with the patient and her husband at bedside.  They both verbalized understanding of the plan.    Patient's progosis: Unable to determine and Goals for treatment: DNR  Husband is the healthcare POA  Additional follow-up to be provided: Hospitalist and urology  Time spent during discussion:17 min  Nicholes Mango, MD

## 2019-01-24 NOTE — Telephone Encounter (Signed)
Doug returned call and I explained to him that she Dr Tasia Catchings wants Kristi [name] to go to ER for evaluation based on her scan results  And read report to him. He states he will try to get her there ASAP

## 2019-01-24 NOTE — ED Notes (Signed)
Annie Main RN, aware of bed assigned

## 2019-01-24 NOTE — Telephone Encounter (Signed)
Please contact patient and ask her to go to ER for evaluation.

## 2019-01-25 DIAGNOSIS — I10 Essential (primary) hypertension: Secondary | ICD-10-CM | POA: Diagnosis not present

## 2019-01-25 DIAGNOSIS — N3080 Other cystitis without hematuria: Secondary | ICD-10-CM | POA: Diagnosis not present

## 2019-01-25 DIAGNOSIS — D649 Anemia, unspecified: Secondary | ICD-10-CM | POA: Diagnosis not present

## 2019-01-25 DIAGNOSIS — C3490 Malignant neoplasm of unspecified part of unspecified bronchus or lung: Secondary | ICD-10-CM | POA: Diagnosis not present

## 2019-01-25 DIAGNOSIS — R197 Diarrhea, unspecified: Secondary | ICD-10-CM | POA: Diagnosis not present

## 2019-01-25 DIAGNOSIS — I2510 Atherosclerotic heart disease of native coronary artery without angina pectoris: Secondary | ICD-10-CM | POA: Diagnosis not present

## 2019-01-25 DIAGNOSIS — A419 Sepsis, unspecified organism: Secondary | ICD-10-CM | POA: Diagnosis not present

## 2019-01-25 DIAGNOSIS — E7800 Pure hypercholesterolemia, unspecified: Secondary | ICD-10-CM | POA: Diagnosis not present

## 2019-01-25 DIAGNOSIS — N10 Acute tubulo-interstitial nephritis: Secondary | ICD-10-CM | POA: Diagnosis not present

## 2019-01-25 DIAGNOSIS — R1031 Right lower quadrant pain: Secondary | ICD-10-CM | POA: Diagnosis not present

## 2019-01-25 LAB — COMPREHENSIVE METABOLIC PANEL
ALT: 12 U/L (ref 0–44)
AST: 14 U/L — ABNORMAL LOW (ref 15–41)
Albumin: 2.9 g/dL — ABNORMAL LOW (ref 3.5–5.0)
Alkaline Phosphatase: 78 U/L (ref 38–126)
Anion gap: 6 (ref 5–15)
BUN: 6 mg/dL — ABNORMAL LOW (ref 8–23)
CO2: 24 mmol/L (ref 22–32)
Calcium: 8.1 mg/dL — ABNORMAL LOW (ref 8.9–10.3)
Chloride: 110 mmol/L (ref 98–111)
Creatinine, Ser: 0.52 mg/dL (ref 0.44–1.00)
GFR calc Af Amer: >60 mL/min (ref >60)
GFR calc non Af Amer: >60 mL/min (ref >60)
Glucose, Bld: 91 mg/dL (ref 70–99)
Potassium: 3.6 mmol/L (ref 3.5–5.1)
Sodium: 140 mmol/L (ref 135–145)
Total Bilirubin: 0.5 mg/dL (ref 0.3–1.2)
Total Protein: 5.5 g/dL — ABNORMAL LOW (ref 6.5–8.1)

## 2019-01-25 LAB — CBC
HCT: 22.3 % — ABNORMAL LOW (ref 36.0–46.0)
Hemoglobin: 7.1 g/dL — ABNORMAL LOW (ref 12.0–15.0)
MCH: 31.4 pg (ref 26.0–34.0)
MCHC: 31.8 g/dL (ref 30.0–36.0)
MCV: 98.7 fL (ref 80.0–100.0)
Platelets: 104 10*3/uL — ABNORMAL LOW (ref 150–400)
RBC: 2.26 MIL/uL — AB (ref 3.87–5.11)
RDW: 19.9 % — ABNORMAL HIGH (ref 11.5–15.5)
WBC: 14.4 10*3/uL — ABNORMAL HIGH (ref 4.0–10.5)
nRBC: 0.1 % (ref 0.0–0.2)

## 2019-01-25 LAB — GASTROINTESTINAL PANEL BY PCR, STOOL (REPLACES STOOL CULTURE)
Adenovirus F40/41: NOT DETECTED
Astrovirus: NOT DETECTED
Campylobacter species: NOT DETECTED
Cryptosporidium: NOT DETECTED
Cyclospora cayetanensis: NOT DETECTED
ENTEROTOXIGENIC E COLI (ETEC): NOT DETECTED
Entamoeba histolytica: NOT DETECTED
Enteroaggregative E coli (EAEC): NOT DETECTED
Enteropathogenic E coli (EPEC): NOT DETECTED
Giardia lamblia: NOT DETECTED
Norovirus GI/GII: NOT DETECTED
Plesimonas shigelloides: NOT DETECTED
Rotavirus A: NOT DETECTED
Salmonella species: NOT DETECTED
Sapovirus (I, II, IV, and V): NOT DETECTED
Shiga like toxin producing E coli (STEC): NOT DETECTED
Shigella/Enteroinvasive E coli (EIEC): NOT DETECTED
Vibrio cholerae: NOT DETECTED
Vibrio species: NOT DETECTED
Yersinia enterocolitica: NOT DETECTED

## 2019-01-25 LAB — PREPARE RBC (CROSSMATCH)

## 2019-01-25 LAB — MAGNESIUM: MAGNESIUM: 1.1 mg/dL — AB (ref 1.7–2.4)

## 2019-01-25 LAB — CLOSTRIDIUM DIFFICILE BY PCR, REFLEXED: Toxigenic C. Difficile by PCR: POSITIVE — AB

## 2019-01-25 LAB — C DIFFICILE QUICK SCREEN W PCR REFLEX
C Diff antigen: POSITIVE — AB
C Diff toxin: NEGATIVE

## 2019-01-25 LAB — HEMOGLOBIN AND HEMATOCRIT, BLOOD
HCT: 25.5 % — ABNORMAL LOW (ref 36.0–46.0)
Hemoglobin: 8.4 g/dL — ABNORMAL LOW (ref 12.0–15.0)

## 2019-01-25 MED ORDER — MAGNESIUM SULFATE 4 GM/100ML IV SOLN
4.0000 g | Freq: Once | INTRAVENOUS | Status: AC
  Administered 2019-01-25: 4 g via INTRAVENOUS
  Filled 2019-01-25: qty 100

## 2019-01-25 MED ORDER — CEFPODOXIME PROXETIL 200 MG PO TABS
200.0000 mg | ORAL_TABLET | Freq: Two times a day (BID) | ORAL | Status: DC
  Administered 2019-01-25: 12:00:00 200 mg via ORAL
  Filled 2019-01-25 (×3): qty 1

## 2019-01-25 MED ORDER — SODIUM CHLORIDE 0.9% IV SOLUTION
Freq: Once | INTRAVENOUS | Status: AC
  Administered 2019-01-25: 14:00:00 via INTRAVENOUS

## 2019-01-25 MED ORDER — CEFPODOXIME PROXETIL 200 MG PO TABS: 200.0000 mg | ORAL_TABLET | Freq: Two times a day (BID) | ORAL | 0 refills | Status: DC

## 2019-01-25 NOTE — Progress Notes (Signed)
Urology Consult Follow Up  Subjective: No complaints when seen this afternoon.  She remains asymptomatic from a urinary standpoint.  Currently on oral Cefpodoxime.  Urine culture growing greater than 100,000 gram-negative rods.  Her C. difficile PCR was positive.  Anti-infectives: Anti-infectives (From admission, onward)   Start     Dose/Rate Route Frequency Ordered Stop   01/25/19 1200  cefpodoxime (VANTIN) tablet 200 mg     200 mg Oral Every 12 hours 01/25/19 1109     01/24/19 2300  meropenem (MERREM) 1 g in sodium chloride 0.9 % 100 mL IVPB  Status:  Discontinued     1 g 200 mL/hr over 30 Minutes Intravenous Every 8 hours 01/24/19 1706 01/25/19 1109   01/24/19 1715  meropenem (MERREM) 1 g in sodium chloride 0.9 % 100 mL IVPB     1 g 200 mL/hr over 30 Minutes Intravenous  Once 01/24/19 1701 01/24/19 1936   01/24/19 1545  ceFEPIme (MAXIPIME) 2 g in sodium chloride 0.9 % 100 mL IVPB     2 g 200 mL/hr over 30 Minutes Intravenous  Once 01/24/19 1534 01/24/19 1650      Current Facility-Administered Medications  Medication Dose Route Frequency Provider Last Rate Last Dose  . 0.9 %  sodium chloride infusion (Manually program via Guardrails IV Fluids)   Intravenous Once Mayo, Pete Pelt, MD      . 0.9 %  sodium chloride infusion   Intravenous Continuous Gouru, Aruna, MD 100 mL/hr at 01/25/19 0739    . acetaminophen (TYLENOL) tablet 650 mg  650 mg Oral Q6H PRN Gouru, Aruna, MD       Or  . acetaminophen (TYLENOL) suppository 650 mg  650 mg Rectal Q6H PRN Gouru, Aruna, MD      . albuterol (PROVENTIL) (2.5 MG/3ML) 0.083% nebulizer solution 3 mL  3 mL Inhalation Q6H PRN Gouru, Aruna, MD      . aspirin EC tablet 81 mg  81 mg Oral Daily Gouru, Aruna, MD      . atorvastatin (LIPITOR) tablet 40 mg  40 mg Oral q1800 Gouru, Aruna, MD      . cefpodoxime Bennie Pierini) tablet 200 mg  200 mg Oral Q12H Mayo, Pete Pelt, MD      . docusate sodium (COLACE) capsule 100 mg  100 mg Oral Daily Gouru, Aruna, MD      .  enoxaparin (LOVENOX) injection 40 mg  40 mg Subcutaneous Q24H Gouru, Aruna, MD      . HYDROcodone-acetaminophen (NORCO/VICODIN) 5-325 MG per tablet 1 tablet  1 tablet Oral Q4H PRN Hillary Bow, MD   1 tablet at 01/25/19 0544  . ipratropium-albuterol (DUONEB) 0.5-2.5 (3) MG/3ML nebulizer solution 3 mL  3 mL Nebulization TID Gouru, Aruna, MD   3 mL at 01/25/19 1302  . morphine 2 MG/ML injection 2 mg  2 mg Intravenous Q4H PRN Sudini, Alveta Heimlich, MD      . nitroGLYCERIN (NITROSTAT) SL tablet 0.4 mg  0.4 mg Sublingual Q5 Min x 3 PRN Gouru, Aruna, MD      . ondansetron (ZOFRAN) tablet 8 mg  8 mg Oral Q8H PRN Gouru, Aruna, MD      . pantoprazole (PROTONIX) EC tablet 40 mg  40 mg Oral Daily Gouru, Aruna, MD   40 mg at 01/25/19 0919  . prochlorperazine (COMPAZINE) tablet 10 mg  10 mg Oral Q6H PRN Gouru, Aruna, MD      . sertraline (ZOLOFT) tablet 50 mg  50 mg Oral Daily Gouru, Illene Silver, MD  50 mg at 01/25/19 0919  . sucralfate (CARAFATE) tablet 1 g  1 g Oral TID WC & HS Gouru, Aruna, MD   1 g at 01/25/19 0920   Facility-Administered Medications Ordered in Other Encounters  Medication Dose Route Frequency Provider Last Rate Last Dose  . heparin lock flush 100 unit/mL  500 Units Intravenous Once Earlie Server, MD      . sodium chloride flush (NS) 0.9 % injection 10 mL  10 mL Intravenous PRN Earlie Server, MD   10 mL at 01/09/19 0820  . sodium chloride flush (NS) 0.9 % injection 10 mL  10 mL Intravenous PRN Earlie Server, MD   10 mL at 01/10/19 1400     Objective: Vital signs in last 24 hours: Temp:  [97.8 F (36.6 C)-98.7 F (37.1 C)] 98.2 F (36.8 C) (02/05 1431) Pulse Rate:  [75-101] 99 (02/05 1431) Resp:  [15-20] 20 (02/05 1431) BP: (90-113)/(53-73) 90/59 (02/05 1431) SpO2:  [96 %-100 %] 98 % (02/05 1431) Weight:  [67.4 kg] 67.4 kg (02/04 2100)  Intake/Output from previous day: 02/04 0701 - 02/05 0700 In: 1198.3 [IV Piggyback:1198.3] Out: -  Intake/Output this shift: No intake/output data  recorded.   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  Has been switched to oral antibiotics.  Urine culture growing gram-negative rods.  Plan: Will sign off.  Happy to see in follow-up 1 month after discharge.   Ronda Fairly [facility] 01/25/2019

## 2019-01-25 NOTE — Discharge Instructions (Signed)
It was so nice to meet you during this hospitalization!  You came into the hospital because your doctor was worried that you had a kidney infection. The urologist did not think that this was an infection of the kidneys. You likely just have a bladder infection. Your urine culture will not come back until tomorrow. I will call you if we need to change your antibiotic. I have prescribed Cefpodoxime. Please take this twice a day, starting tonight and then for another 5 days.  We also gave you a blood transfusion because your hemoglobin was low.  Please make sure you follow-up with your primary care doctor and your oncologist when you leave the hospital.  Take care, Dr. Brett Albino

## 2019-01-25 NOTE — Progress Notes (Signed)
Pt left via w/c at this time with spouse to home.  No c/o. tol blood trans well. H/h drawn 1 hr post blood. Sl d/cd. Discharge instructions discussed with pt and spouse meds / diet activity and f/u discussed.

## 2019-01-25 NOTE — Discharge Summary (Signed)
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 Acute MI, inferoposterior wall ([place]) 09/30/2014  . Claustrophobia   . Coronary artery disease   . Hypercholesteremia   . Hypertension   . MI, old   . 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 [facility]) 10/27/2018   HOSPITAL COURSE:   Renita is a 73 year old female who was sent to the ED from the oncology office due to findings of emphysematous cystitis and possible pyelonephritis on CT abdomen pelvis.  In the ED, patient was started on IV cefepime and she was admitted for further management.  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  No fevers, tachypnea, tachycardia, or lactic acidosis. -Leukocytosis improved -Initially treated with IV cefepime and then transitioned to p.o. cefpodoxime on discharge -Seen by urology, who did not feel that she had pyelo and recommended treatment with antibiotics because she is immunocompromised.  Follow-up in office in 1 month.  -Urology also recommended Foley placement to help rid of some of the gas, but patient refused. -Urine culture growing > 100,000 colonies gram-negative rods.  Will need outpatient follow-up for susceptibilities -Blood cultures with no growth  Acute on chronic anemia- likely due to chemotherapy.   -Hemoglobin dropped from 8.4 > 7.1 and patient received 1 unit PRBC per oncology recommendations. -Needs CBC rechecked as an outpatient  Diarrhea- intermittent and chronic.  May be  secondary to chemotherapy. -C. difficile PCR came back positive, but patient did not have any bowel movements while hospitalized.  Likely due to colonization.  Vancomycin was not started.  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 -Needs to follow-up with oncology as an outpatient  DISCHARGE CONDITIONS:  Emphysematous cystitis Acute on chronic anemia Diarrhea Hypertension Stage IV small cell lung cancer CONSULTS OBTAINED:  Treatment Team:  Abbie Sons, MD DRUG ALLERGIES:  No Known Allergies DISCHARGE MEDICATIONS:   Allergies as of 01/25/2019   No Known Allergies     Medication List    STOP taking these medications   chlorhexidine 0.12 % solution Commonly known as:  PERIDEX   dronabinol 5 MG capsule Commonly known as:  MARINOL   Fluticasone-Umeclidin-Vilant 100-62.5-25 MCG/INH Aepb Commonly known as:  TRELEGY ELLIPTA     TAKE these medications   albuterol 108 (90 Base) MCG/ACT inhaler Commonly known as:  PROAIR HFA Inhale 2 puffs into the lungs every 6 (six) hours as needed for wheezing or shortness of breath.   aspirin EC 81 MG tablet Take 81 mg by mouth daily.   atorvastatin 80 MG tablet Commonly known as:  LIPITOR Take 0.5 tablets (40 mg total) by mouth daily at 6 PM. What changed:    how much to take  when to take this   benzonatate 100 MG capsule Commonly known as:  TESSALON PERLES Take 2 capsules (200 mg total) by mouth 3 (three) times daily.   CALCIUM PO Take 1 tablet by mouth daily.  cefpodoxime 200 MG tablet Commonly known as:  VANTIN Take 1 tablet (200 mg total) by mouth every 12 (twelve) hours.   dexamethasone 4 MG tablet Commonly known as:  DECADRON Take 8mg  daily for 2 days after chemtherapy treatments.   docusate sodium 100 MG capsule Commonly known as:  COLACE Take 100 mg by mouth daily.     HYDROcodone-acetaminophen 5-325 MG tablet Commonly known as:  NORCO/VICODIN Take 1 tablet by mouth every 12 (twelve) hours as needed for moderate pain.   ipratropium-albuterol 0.5-2.5 (3) MG/3ML Soln Commonly known as:  DUONEB Take 3 mLs by nebulization 3 (three) times daily. DX. J44.9   lidocaine-prilocaine cream Commonly known as:  EMLA Apply to affected area once   metoprolol tartrate 25 MG tablet Commonly known as:  LOPRESSOR Take 12.5 mg by mouth 2 (two) times daily.   nitroGLYCERIN 0.4 MG SL tablet Commonly known as:  NITROSTAT Place 1 tablet (0.4 mg total) under the tongue every 5 (five) minutes x 3 doses as needed for chest pain.   omeprazole 40 MG capsule Commonly known as:  PRILOSEC TAKE 1 CAPSULE(40 MG) BY MOUTH DAILY   ondansetron 8 MG tablet Commonly known as:  ZOFRAN Take 1 tablet (8 mg total) by mouth 2 (two) times daily as needed for refractory nausea / vomiting. Start on day 3 after carboplatin chemo.   polyethylene glycol packet Commonly known as:  MIRALAX / GLYCOLAX Take 17 g by mouth daily.   potassium chloride SA 20 MEQ tablet Commonly known as:  K-DUR,KLOR-CON TAKE 1 TABLET(20 MEQ) BY MOUTH DAILY   prochlorperazine 10 MG tablet Commonly known as:  COMPAZINE TAKE 1 TABLET BY MOUTH EVERY 6 HOURS AS NEEDED FOR NAUSEA OR VOMITING   sertraline 50 MG tablet Commonly known as:  ZOLOFT Take 1 tablet (50 mg total) by mouth daily.   sucralfate 1 g tablet Commonly known as:  CARAFATE TAKE 1 TABLET BY MOUTH FOUR TIMES DAILY, WITH MEALS& AT BEDTIME   VITAMIN B COMPLEX PO Take 1 tablet by mouth daily.   Vitamin D 50 MCG (2000 UT) Caps Take 1 capsule (2,000 Units total) by mouth daily.        DISCHARGE INSTRUCTIONS:  1.  Follow-up with PCP in 5 days 2.  Follow-up with oncology in 1 week 3.  Take Cefpodoxime twice a day for a total 7-day course 4.  Follow-up with urology in 1 month 5.  Recheck CBC as an outpatient 6.  Follow-up urine culture  susceptibilities as an outpatient DIET:  Regular diet DISCHARGE CONDITION:  Stable ACTIVITY:  Activity as tolerated OXYGEN:  Home Oxygen: No.  Oxygen Delivery: room air DISCHARGE LOCATION:  home   If you experience worsening of your admission symptoms, develop shortness of breath, life threatening emergency, suicidal or homicidal thoughts you must seek medical attention immediately by calling 911 or calling your MD immediately  if symptoms less severe.  You Must read complete instructions/literature along with all the possible adverse reactions/side effects for all the Medicines you take and that have been prescribed to you. Take any new Medicines after you have completely understood and accpet all the possible adverse reactions/side effects.   Please note  You were cared for by a hospitalist during your hospital stay. If you have any questions about your discharge medications or the care you received while you were in the hospital after you are discharged, you can call the unit and asked to speak with the hospitalist on call if the hospitalist that  took care of you is not available. Once you are discharged, your primary care physician will handle any further medical issues. Please note that NO REFILLS for any discharge medications will be authorized once you are discharged, as it is imperative that you return to your primary care physician (or establish a relationship with a primary care physician if you do not have one) for your aftercare needs so that they can reassess your need for medications and monitor your lab values.    On the day of Discharge:  VITAL SIGNS:  Blood pressure (!) 90/59, pulse 99, temperature 98.2 F (36.8 C), temperature source Oral, resp. rate 20, height 5\' 5"  (1.651 m), weight 67.4 kg, SpO2 98 %. PHYSICAL EXAMINATION:  GENERAL:  73 y.o.-year-old patient lying in the bed with no acute distress.  Chronically ill-appearing. EYES: Pupils equal, round, reactive to  light and accommodation. No scleral icterus. Extraocular muscles intact.  HEENT: Head atraumatic, normocephalic. Oropharynx and nasopharynx clear.  NECK:  Supple, no jugular venous distention. No thyroid enlargement, no tenderness.  LUNGS: Normal breath sounds bilaterally, no wheezing, rales,rhonchi or crepitation. No use of accessory muscles of respiration.  CARDIOVASCULAR: RRR, S1, S2 normal. No murmurs, rubs, or gallops.  BACK: No CVA tenderness ABDOMEN: Soft, right lower quadrant is tender, no rebound tenderness nondistended. Bowel sounds present.  EXTREMITIES: No pedal edema, cyanosis, or clubbing.  NEUROLOGIC: Awake, alert and oriented x3. Sensation intact. Gait not checked.  PSYCHIATRIC: The patient is alert and oriented x 3.  SKIN: No obvious rash, lesion, or ulcer.  DATA REVIEW:   CBC Recent Labs  Lab 01/25/19 0434  WBC 14.4*  HGB 7.1*  HCT 22.3*  PLT 104*    Chemistries  Recent Labs  Lab 01/25/19 0434  NA 140  K 3.6  CL 110  CO2 24  GLUCOSE 91  BUN 6*  CREATININE 0.52  CALCIUM 8.1*  MG 1.1*  AST 14*  ALT 12  ALKPHOS 27  BILITOT 0.5     Microbiology Results  Results for orders placed or performed during the hospital encounter of 01/24/19  Urine culture     Status: Abnormal (Preliminary result)   Collection Time: 01/24/19  2:40 PM  Result Value Ref Range Status   Specimen Description   Final    URINE, RANDOM Performed at [facility], [address] [place], [place] [address]    Special Requests   Final    NONE Performed at [facility], [address], [place], [place] [address]    Culture >=100,000 COLONIES/mL GRAM NEGATIVE RODS (A)  Final   Report Status PENDING  Incomplete  Blood culture (single)     Status: None (Preliminary result)   Collection Time: 01/24/19  2:40 PM  Result Value Ref Range Status   Specimen Description BLOOD LEFT ANTECUBITAL  Final   Special Requests   Final    BOTTLES DRAWN AEROBIC AND ANAEROBIC  Blood Culture adequate volume   Culture   Final    NO GROWTH < 24 HOURS Performed at [facility], [address], [place],  [address]    Report Status PENDING  Incomplete  Blood culture (routine x 2)     Status: None (Preliminary result)   Collection Time: 01/24/19  4:02 PM  Result Value Ref Range Status   Specimen Description BLOOD LEFT ANTECUBITAL  Final   Special Requests   Final    BOTTLES DRAWN AEROBIC AND ANAEROBIC Blood Culture results may not be optimal due to  an excessive volume of blood received in culture bottles   Culture   Final    NO GROWTH < 24 HOURS Performed at [facility], [address] [place], [place] [address]    Report Status PENDING  Incomplete  Gastrointestinal Panel by PCR , Stool     Status: None   Collection Time: 01/25/19 11:12 AM  Result Value Ref Range Status   Campylobacter species NOT DETECTED NOT DETECTED Final   Plesimonas shigelloides NOT DETECTED NOT DETECTED Final   Salmonella species NOT DETECTED NOT DETECTED Final   Yersinia enterocolitica NOT DETECTED NOT DETECTED Final   Vibrio species NOT DETECTED NOT DETECTED Final   Vibrio cholerae NOT DETECTED NOT DETECTED Final   Enteroaggregative E coli (EAEC) NOT DETECTED NOT DETECTED Final   Enteropathogenic E coli (EPEC) NOT DETECTED NOT DETECTED Final   Enterotoxigenic E coli (ETEC) NOT DETECTED NOT DETECTED Final   Shiga like toxin producing E coli (STEC) NOT DETECTED NOT DETECTED Final   Shigella/Enteroinvasive E coli (EIEC) NOT DETECTED NOT DETECTED Final   Cryptosporidium NOT DETECTED NOT DETECTED Final   Cyclospora cayetanensis NOT DETECTED NOT DETECTED Final   Entamoeba histolytica NOT DETECTED NOT DETECTED Final   Giardia lamblia NOT DETECTED NOT DETECTED Final   Adenovirus F40/41 NOT DETECTED NOT DETECTED Final   Astrovirus NOT DETECTED NOT DETECTED Final   Norovirus GI/GII NOT DETECTED NOT DETECTED Final   Rotavirus A NOT DETECTED NOT DETECTED Final    Sapovirus (I, II, IV, and V) NOT DETECTED NOT DETECTED Final    Comment: Performed at [facility], [address] [place], [place] [address]  C difficile quick scan w PCR reflex     Status: Abnormal   Collection Time: 01/25/19 11:12 AM  Result Value Ref Range Status   C Diff antigen POSITIVE (A) NEGATIVE Final   C Diff toxin NEGATIVE NEGATIVE Final   C Diff interpretation Results are indeterminate. See PCR results.  Final    Comment: Performed at [facility], [address] [place], [place] [address]  C. Diff by PCR, Reflexed     Status: Abnormal   Collection Time: 01/25/19 11:12 AM  Result Value Ref Range Status   Toxigenic C. Difficile by PCR POSITIVE (A) NEGATIVE Final    Comment: Positive for toxigenic C. difficile with little to no toxin production. Only treat if clinical presentation suggests symptomatic illness. Performed at [facility], [address], [place], [place] [address]     RADIOLOGY:  No results found.   Management plans discussed with the patient, family and they are in agreement.  CODE STATUS: DNR   TOTAL TIME TAKING CARE OF THIS PATIENT: 45 minutes.    Berna Spare Fraida Veldman M.D on 01/25/2019 at 3:26 PM  Between 7am to 6pm - Pager [phone]  After 6pm go to [email or web address] - Proofreader  [employer] [facility]  Office  [phone]  CC: Primary care physician; Glean Hess, MD   Note: This dictation was prepared with Dragon dictation along with smaller phrase technology. Any transcriptional errors that result from this process are unintentional.

## 2019-01-25 NOTE — Plan of Care (Signed)
Pt to discharge home after unit of blood completes. Received mag iv earlier. Pt to get h/h post blood ASSESSMENT:/o. No distress.husband at bedside. Problem: Urinary Elimination: Goal: Signs and symptoms of infection will decrease Outcome: Progressing   Problem: Education: Goal: Knowledge of General Education information will improve Description Including pain rating scale, medication(s)/side effects and non-pharmacologic comfort measures Outcome: Progressing   Problem: Health Behavior/Discharge Planning: Goal: Ability to manage health-related needs will improve Outcome: Progressing   Problem: Clinical Measurements: Goal: Ability to maintain clinical measurements within normal limits will improve Outcome: Progressing Goal: Will remain free from infection Outcome: Progressing Goal: Diagnostic test results will improve Outcome: Progressing Goal: Respiratory complications will improve Outcome: Progressing Goal: Cardiovascular complication will be avoided Outcome: Progressing   Problem: Activity: Goal: Risk for activity intolerance will decrease Outcome: Progressing   Problem: Nutrition: Goal: Adequate nutrition will be maintained Outcome: Progressing   Problem: Coping: Goal: Level of anxiety will decrease Outcome: Progressing   Problem: Elimination: Goal: Will not experience complications related to bowel motility Outcome: Progressing Goal: Will not experience complications related to urinary retention Outcome: Progressing   Problem: Pain Managment: Goal: General experience of comfort will improve Outcome: Progressing   Problem: Safety: Goal: Ability to remain free from injury will improve Outcome: Progressing   Problem: Skin Integrity: Goal: Risk for impaired skin integrity will decrease Outcome: Progressing

## 2019-01-26 ENCOUNTER — Other Ambulatory Visit: Payer: Self-pay | Admitting: Oncology

## 2019-01-26 LAB — TYPE AND SCREEN
ABO/RH(D): A POS
Antibody Screen: NEGATIVE
Unit division: 0

## 2019-01-26 LAB — BPAM RBC
Blood Product Expiration Date: 202002222359
ISSUE DATE / TIME: 202002051438
Unit Type and Rh: 6200

## 2019-01-27 ENCOUNTER — Telehealth: Payer: Self-pay

## 2019-01-27 LAB — URINE CULTURE: Culture: >=100000 — AB

## 2019-01-27 NOTE — Telephone Encounter (Signed)
Per Dr. Tasia Catchings, patient's recent CT showed good response; Will not continue chemotherapy, Continue Tecentriq. Verbal order was given to cancel [place], VP16 and Udenyca scheduled next week, keep Tecentriq.  Treatments cancelled by scheduling as ordered.

## 2019-01-27 NOTE — Progress Notes (Signed)
Patient informed. 

## 2019-01-29 LAB — CULTURE, BLOOD (ROUTINE X 2): Culture: NO GROWTH

## 2019-01-29 LAB — CULTURE, BLOOD (SINGLE)
Culture: NO GROWTH
SPECIAL REQUESTS: ADEQUATE

## 2019-01-30 ENCOUNTER — Other Ambulatory Visit: Payer: Self-pay

## 2019-01-30 ENCOUNTER — Inpatient Hospital Stay: Payer: Medicare Other

## 2019-01-30 ENCOUNTER — Encounter: Payer: Self-pay | Admitting: Oncology

## 2019-01-30 ENCOUNTER — Inpatient Hospital Stay (HOSPITAL_BASED_OUTPATIENT_CLINIC_OR_DEPARTMENT_OTHER): Payer: Medicare Other | Admitting: Oncology

## 2019-01-30 ENCOUNTER — Inpatient Hospital Stay: Payer: Medicare Other | Attending: Oncology

## 2019-01-30 VITALS — BP 129/84 | HR 90 | Temp 97.1°F | Wt 144.9 lb

## 2019-01-30 DIAGNOSIS — D6481 Anemia due to antineoplastic chemotherapy: Secondary | ICD-10-CM | POA: Insufficient documentation

## 2019-01-30 DIAGNOSIS — D696 Thrombocytopenia, unspecified: Secondary | ICD-10-CM

## 2019-01-30 DIAGNOSIS — A09 Infectious gastroenteritis and colitis, unspecified: Secondary | ICD-10-CM

## 2019-01-30 DIAGNOSIS — T451X5A Adverse effect of antineoplastic and immunosuppressive drugs, initial encounter: Secondary | ICD-10-CM

## 2019-01-30 DIAGNOSIS — R5383 Other fatigue: Secondary | ICD-10-CM

## 2019-01-30 DIAGNOSIS — C3402 Malignant neoplasm of left main bronchus: Secondary | ICD-10-CM | POA: Insufficient documentation

## 2019-01-30 DIAGNOSIS — Z79899 Other long term (current) drug therapy: Secondary | ICD-10-CM | POA: Diagnosis not present

## 2019-01-30 DIAGNOSIS — E860 Dehydration: Secondary | ICD-10-CM | POA: Insufficient documentation

## 2019-01-30 DIAGNOSIS — R05 Cough: Secondary | ICD-10-CM | POA: Diagnosis not present

## 2019-01-30 DIAGNOSIS — C3490 Malignant neoplasm of unspecified part of unspecified bronchus or lung: Secondary | ICD-10-CM

## 2019-01-30 LAB — COMPREHENSIVE METABOLIC PANEL
ALT: 13 U/L (ref 0–44)
AST: 17 U/L (ref 15–41)
Albumin: 3.5 g/dL (ref 3.5–5.0)
Alkaline Phosphatase: 86 U/L (ref 38–126)
Anion gap: 6 (ref 5–15)
BUN: 10 mg/dL (ref 8–23)
CHLORIDE: 106 mmol/L (ref 98–111)
CO2: 24 mmol/L (ref 22–32)
Calcium: 9 mg/dL (ref 8.9–10.3)
Creatinine, Ser: 0.53 mg/dL (ref 0.44–1.00)
GFR calc Af Amer: >60 mL/min (ref >60)
GFR calc non Af Amer: >60 mL/min (ref >60)
Glucose, Bld: 99 mg/dL (ref 70–99)
Potassium: 3.8 mmol/L (ref 3.5–5.1)
Sodium: 136 mmol/L (ref 135–145)
Total Bilirubin: 0.4 mg/dL (ref 0.3–1.2)
Total Protein: 6.8 g/dL (ref 6.5–8.1)

## 2019-01-30 LAB — CBC WITH DIFFERENTIAL/PLATELET
Abs Immature Granulocytes: 0.42 10*3/uL — ABNORMAL HIGH (ref 0.00–0.07)
Basophils Absolute: 0.1 10*3/uL (ref 0.0–0.1)
Basophils Relative: 1 %
Eosinophils Absolute: 0 10*3/uL (ref 0.0–0.5)
Eosinophils Relative: 0 %
HCT: 30 % — ABNORMAL LOW (ref 36.0–46.0)
Hemoglobin: 9.8 g/dL — ABNORMAL LOW (ref 12.0–15.0)
Immature Granulocytes: 5 %
Lymphocytes Relative: 22 %
Lymphs Abs: 2 10*3/uL (ref 0.7–4.0)
MCH: 31.4 pg (ref 26.0–34.0)
MCHC: 32.7 g/dL (ref 30.0–36.0)
MCV: 96.2 fL (ref 80.0–100.0)
MONOS PCT: 15 %
Monocytes Absolute: 1.4 10*3/uL — ABNORMAL HIGH (ref 0.1–1.0)
Neutro Abs: 5.3 10*3/uL (ref 1.7–7.7)
Neutrophils Relative %: 57 %
PLATELETS: 233 10*3/uL (ref 150–400)
RBC: 3.12 MIL/uL — ABNORMAL LOW (ref 3.87–5.11)
RDW: 18.8 % — ABNORMAL HIGH (ref 11.5–15.5)
WBC: 9.2 10*3/uL (ref 4.0–10.5)
nRBC: 0 % (ref 0.0–0.2)

## 2019-01-30 LAB — TSH: TSH: 1.973 u[IU]/mL (ref 0.350–4.500)

## 2019-01-30 MED ORDER — VANCOMYCIN 50 MG/ML ORAL SOLUTION: 125.0000 mg | Freq: Four times a day (QID) | ORAL | 0 refills | Status: DC

## 2019-01-30 MED ORDER — HEPARIN SOD (PORK) LOCK FLUSH 100 UNIT/ML IV SOLN
500.0000 [IU] | Freq: Once | INTRAVENOUS | Status: AC
  Administered 2019-01-30: 500 [IU] via INTRAVENOUS

## 2019-01-30 NOTE — Progress Notes (Signed)
Nutrition  RD was planning to see patient during treatment today but infusion cancelled.  Called patient this afternoon and reports that she is not feeling well and had told staff that she wanted to cancel RD appointment for today.  RD did not receive message.  Will try and follow-up with patient on another visit.  Patient agreeable.   Kristi Hollan B. Zenia Resides, [place], [place] Registered Dietitian [phone] (pager)

## 2019-01-30 NOTE — Progress Notes (Signed)
Hematology/Oncology Follow up note [facility] Telephone:([phone] [phone]   Patient Care Team: Glean Hess, MD as PCP - General (Internal Medicine) Charolette Forward, MD as Consulting Physician (Cardiology) Telford Nab, RN as Registered Nurse  REFERRING PROVIDER: Dr. 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 Patient recently was referred to see pulmonology Dr. Felicie Morn for persistent cough and dyspnea.  Patient reports coughing up clear sputum for about 2 months.  No hemoptysis.  She also had weight loss. Former smoker, quitting smoking 2 weeks ago.  39-pack-year smoking history  09/30/2018 chest x-ray showed new market volume loss in the left chest area of the left mainstem bronchus worrisome for centrally obstructing mass. 10/17/2018 chest x-ray showed volume loss worsened in the left hemithorax worsening patchy consolidation in the mid to lower left lung with minimal residual aeration and up in the left lung mass not excluded.  Possible small left pleural effusion. 10/31 2019 CT chest with contrast showed large mediastinal mass involving both hilar, left greater than right, consistent with lung carcinoma The mass causes narrowing of the left mainstem bronchus with resultant volume loss on the left and a mediastinal shift to the left.  Moderate size left pleural effusion. 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  Today patient was accompanied by husband and daughter to clinic to discuss diagnosis and management plan. She reports feeling tired feels shortness of breath with exertion.  Persistent coughing, productive with clear sputum. Also  reports difficulty swallowing, feels food sticking in her food pipe. MRI brain was obtained which was negative for intracranial metastatic disease.  INTERVAL HISTORY Kristi [name] is a 73 y.o. female who has above history reviewed by me today presents for acute visit for evaluation of cycle 4 chemotherapy and immunotherapy for treatment of extensive small cell lung cancer.  Status post 4 cycles of carbo, it etoposide, Tecentriq. During the interval patient had a CT scan done which showed continued positive response to therapy with continued reduction in mediastinal adenopathy.  No residual measurable left lung mass. However she developed new extensive gas throughout the bladder wall and within the bladder lumen with underlying mild diffuse bladder wall thickening.  Concerning for acute emphysematous cystitis.  There is also to new subcentimeter low-attenuation lesion in the right kidney.  Nonspecific but worrisome for right pyelonephritis. I instructed patient to go to emergency room for further evaluation.  Patient was admitted for treatment for acute cystitis.  Urology was consulted and evaluate patient during her hospitalization.  Urology did not feel that patient has pyelonephritis and recommend treatment with antibiotics because patient's immunocompromise.  Patient was on IV cephapirin and then transitioned to p.o.cefpodoxime at discharge. Urine culture showed Klebsiella pneumonia which is pansensitive except ampicillin.  Today patient reports that has almost finished UTI antibiotic course. C. Difficile antigen was checked and C. difficile toxin was negative, which indicate little to no toxin production. Reports having diarrhea, average 3 episodes of loose stools daily, despite taking Imodium as instructed.  #Patient also received 1 unit of PRBC transfusion during her hospitalization. Denies any nausea, vomiting.  Appetite is poor.   Review of Systems  Constitutional: Positive for  fatigue. Negative for appetite change, chills  and fever.  HENT:   Negative for hearing loss and voice change.   Eyes: Negative for eye problems.  Respiratory: Positive for cough. Negative for chest tightness and shortness of breath.   Cardiovascular: Negative for chest pain.  Gastrointestinal: Positive for diarrhea. Negative for abdominal distention, abdominal pain and blood in stool.  Endocrine: Negative for hot flashes.  Genitourinary: Negative for difficulty urinating and frequency.   Musculoskeletal: Negative for arthralgias.  Skin: Negative for itching and rash.  Neurological: Negative for extremity weakness.  Hematological: Negative for adenopathy.  Psychiatric/Behavioral: Negative for confusion.     MEDICAL HISTORY:  Past Medical History:  Diagnosis Date  . Acute MI, inferoposterior wall ([place]) 09/30/2014  . Claustrophobia   . Coronary artery disease   . Hypercholesteremia   . Hypertension   . MI, old   . 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 [facility]) 10/27/2018    SURGICAL HISTORY: Past Surgical History:  Procedure Laterality Date  . APPENDECTOMY     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  . BLADDER NECK SUSPENSION    . CHOLECYSTECTOMY N/A 08/14/2018   Procedure: LAPAROSCOPIC CHOLECYSTECTOMY;  Surgeon: Jules Husbands, MD;  Location: [facility] ORS;  Service: General;  Laterality: N/A;  . CHOLECYSTECTOMY  11/2018  . CORONARY STENT PLACEMENT    . ENDOBRONCHIAL ULTRASOUND N/A 10/21/2018   Procedure: ENDOBRONCHIAL ULTRASOUND;  Surgeon: Laverle Hobby, MD;  Location: [facility] ORS;  Service: Pulmonary;  Laterality: N/A;  . HERNIA REPAIR    . IR FLUORO GUIDE CV LINE RIGHT  12/19/2018  . LEFT HEART CATHETERIZATION WITH CORONARY ANGIOGRAM N/A 09/29/2014   Procedure: LEFT HEART CATHETERIZATION WITH CORONARY ANGIOGRAM;  Surgeon: Clent Demark, MD;  Location: [facility] CATH LAB;  Service: Cardiovascular;  Laterality: N/A;  . PORTA CATH INSERTION N/A 01/02/2019   Procedure: PORTA CATH INSERTION;  Surgeon:  Algernon Huxley, MD;  Location: [place] CV LAB;  Service: Cardiovascular;  Laterality: N/A;  . PORTACATH PLACEMENT Right 10/28/2018   Procedure: INSERTION PORT-A-CATH;  Surgeon: Jules Husbands, MD;  Location: [facility] ORS;  Service: General;  Laterality: Right;    SOCIAL HISTORY: Social History   Socioeconomic History  . Marital status: Married    Spouse name: [name]   . Number of children: 3  . Years of education: Not on file  . Highest education level: Not on file  Occupational History  . Occupation: Retired    Comment: Chief Technology Officer   Social Needs  . Financial resource strain: Not very hard  . Food insecurity:    Worry: Not on file    Inability: Not on file  . Transportation needs:    Medical: No    Non-medical: No  Tobacco Use  . Smoking status: Former Smoker    Packs/day: 1.00    Years: 39.00    Pack years: 39.00    Types: Cigarettes    Start date: 12/21/1978    Last attempt to quit: 10/16/2018    Years since quitting: 0.2  . Smokeless tobacco: Never Used  Substance and Sexual Activity  . Alcohol use: Yes    Alcohol/week: 0.0 standard drinks    Comment: occassional - approx 1 every 2 weeks   . Drug use: No  . Sexual activity: Yes    Birth control/protection: None  Lifestyle  . Physical activity:    Days per week: 0 days    Minutes per session: Not on file  . Stress: To some extent  Relationships  . Social connections:  Talks on phone: Three times a week    Gets together: Once a week    Attends religious service: 1 to 4 times per year    Active member of club or organization: Not on file    Attends meetings of clubs or organizations: Not on file    Relationship status: Married  . Intimate partner violence:    Fear of current or ex partner: No    Emotionally abused: No    Physically abused: No    Forced sexual activity: No  Other Topics Concern  . Not on file  Social History Narrative  . Not on file    FAMILY HISTORY: Family History  Problem Relation  Age of Onset  . Alzheimer's disease Mother   . Colon cancer Father   . Breast cancer Neg Hx     ALLERGIES:  has No Known Allergies.  MEDICATIONS:  Current Outpatient Medications  Medication Sig Dispense Refill  . albuterol (PROAIR HFA) 108 (90 Base) MCG/ACT inhaler Inhale 2 puffs into the lungs every 6 (six) hours as needed for wheezing or shortness of breath. 18 g 2  . aspirin EC 81 MG tablet Take 81 mg by mouth daily.    [name] Kitchen atorvastatin (LIPITOR) 80 MG tablet Take 0.5 tablets (40 mg total) by mouth daily at 6 PM. (Patient taking differently: Take 20 mg by mouth 2 (two) times daily. ) 30 tablet 3  . B Complex Vitamins (VITAMIN B COMPLEX PO) Take 1 tablet by mouth daily.     . benzonatate (TESSALON PERLES) 100 MG capsule Take 2 capsules (200 mg total) by mouth 3 (three) times daily. (Patient not taking: Reported on 01/24/2019) 90 capsule 2  . CALCIUM PO Take 1 tablet by mouth daily.     . cefpodoxime (VANTIN) 200 MG tablet Take 1 tablet (200 mg total) by mouth every 12 (twelve) hours. 11 tablet 0  . Cholecalciferol (VITAMIN D) 2000 units CAPS Take 1 capsule (2,000 Units total) by mouth daily. 30 capsule   . dexamethasone (DECADRON) 4 MG tablet Take 8mg  daily for 2 days after chemtherapy treatments. 30 tablet 1  . docusate sodium (COLACE) 100 MG capsule Take 100 mg by mouth daily.    [name] Kitchen HYDROcodone-acetaminophen (NORCO/VICODIN) 5-325 MG tablet Take 1 tablet by mouth every 12 (twelve) hours as needed for moderate pain. 30 tablet 0  . ipratropium-albuterol (DUONEB) 0.5-2.5 (3) MG/3ML SOLN Take 3 mLs by nebulization 3 (three) times daily. DX. J44.9 360 mL 1  . lidocaine-prilocaine (EMLA) cream Apply to affected area once 30 g 3  . metoprolol tartrate (LOPRESSOR) 25 MG tablet Take 12.5 mg by mouth 2 (two) times daily.    . nitroGLYCERIN (NITROSTAT) 0.4 MG SL tablet Place 1 tablet (0.4 mg total) under the tongue every 5 (five) minutes x 3 doses as needed for chest pain. 25 tablet 12  . omeprazole  (PRILOSEC) 40 MG capsule TAKE 1 CAPSULE(40 MG) BY MOUTH DAILY 90 capsule 0  . ondansetron (ZOFRAN) 8 MG tablet Take 1 tablet (8 mg total) by mouth 2 (two) times daily as needed for refractory nausea / vomiting. Start on day 3 after carboplatin chemo. 30 tablet 1  . polyethylene glycol (MIRALAX / GLYCOLAX) packet Take 17 g by mouth daily.    . potassium chloride SA (K-DUR,KLOR-CON) 20 MEQ tablet TAKE 1 TABLET(20 MEQ) BY MOUTH DAILY 90 tablet 1  . prochlorperazine (COMPAZINE) 10 MG tablet TAKE 1 TABLET BY MOUTH EVERY 6 HOURS AS NEEDED FOR NAUSEA OR VOMITING  337 tablet 1  . sertraline (ZOLOFT) 50 MG tablet Take 1 tablet (50 mg total) by mouth daily. 30 tablet 1  . sucralfate (CARAFATE) 1 g tablet TAKE 1 TABLET BY MOUTH FOUR TIMES DAILY, WITH MEALS& AT BEDTIME 368 tablet 0   No current facility-administered medications for this visit.    Facility-Administered Medications Ordered in Other Visits  Medication Dose Route Frequency Provider Last Rate Last Dose  . heparin lock flush 100 unit/mL  500 Units Intravenous Once Earlie Server, MD      . sodium chloride flush (NS) 0.9 % injection 10 mL  10 mL Intravenous PRN Earlie Server, MD   10 mL at 01/09/19 0820  . sodium chloride flush (NS) 0.9 % injection 10 mL  10 mL Intravenous PRN Earlie Server, MD   10 mL at 01/10/19 1400     PHYSICAL EXAMINATION: ECOG PERFORMANCE STATUS: 1 - Symptomatic but completely ambulatory Vitals:   01/30/19 0853  BP: 129/84  Pulse: 90  Temp: (!) 97.1 F (36.2 C)   Filed Weights   01/30/19 0853  Weight: 144 lb 14.4 oz (65.7 kg)   Physical Exam  Constitutional: She is oriented to person, place, and time. No distress.  HENT:  Head: Normocephalic and atraumatic.  Nose: Nose normal.  Mouth/Throat: Oropharynx is clear and moist. No oropharyngeal exudate.  Eyes: Pupils are equal, round, and reactive to light. EOM are normal. No scleral icterus.  Neck: Normal range of motion. Neck supple.  Cardiovascular: Normal rate and regular  rhythm.  No murmur heard. Pulmonary/Chest: Effort normal. No respiratory distress.  Decreased breath sounds bilaterally.  Abdominal: Soft. She exhibits no distension. There is no abdominal tenderness.  Musculoskeletal: Normal range of motion.        General: No edema.  Neurological: She is alert and oriented to person, place, and time. No cranial nerve deficit. She exhibits normal muscle tone. Coordination normal.  Skin: Skin is warm and dry. She is not diaphoretic. No erythema.  Psychiatric: Affect normal.       LABORATORY DATA:  I have reviewed the data as listed Lab Results  Component Value Date   WBC 14.4 (H) 01/25/2019   HGB 8.4 (L) 01/25/2019   HCT 25.5 (L) 01/25/2019   MCV 98.7 01/25/2019   PLT 104 (L) 01/25/2019   Recent Labs    08/12/18 1438  01/09/19 0811 01/24/19 1440 01/25/19 0434  NA 131*   < > 137 138 140  K 3.7   < > 3.7 3.2* 3.6  CL 97*   < > 105 104 110  CO2 25   < > 24 27 24   GLUCOSE 122*   < > 106* 92 91  BUN 9   < > 6* 7* 6*  CREATININE 0.74   < > 0.50 0.51 0.52  CALCIUM 9.1   < > 8.7* 8.5* 8.1*  GFRNONAA >60   < > >60 >60 >60  GFRAA >60   < > >60 >60 >60  PROT 8.0   < > 6.6 6.9 5.5*  ALBUMIN 4.3   < > 3.3* 3.3* 2.9*  AST 33   < > 16 16 14*  ALT 26   < > 11 15 12   ALKPHOS 81   < > 97 97 78  BILITOT 0.6   < > 0.4 0.3 0.5  BILIDIR 0.2  --   --   --   --   IBILI 0.4  --   --   --   --    < > =  values in this interval not displayed.    RADIOGRAPHIC STUDIES: I have personally reviewed the radiological images as listed and agreed with the findings in the report. 10/20/2018 CT chest w contrast . Large mediastinal mass involving both hila left much greater than right consistent with lung carcinoma possibly small cell lung carcinoma. This mass causes narrowing of the left mainstem bronchus with resultant volume loss on the left and mediastinal shift to the Left. 2. Moderate size left pleural effusion. 3. Coronary artery calcifications and moderate  thoracic aortic atherosclerosis. PET 10/26/2018 . Large left lung mass is markedly hypermetabolic in the confluent lymphadenopathy in the mediastinum and in bilateral hilar regions also shows marked hypermetabolism. 2. Hypermetabolic metastatic lymphadenopathy in the right neck and supraclavicular region. 3. Hypermetabolic lymphadenopathy in the hepato duodenal ligament consistent with metastatic disease. 4. Large hypermetabolic lesion posterior right acetabulum without correlate of CT finding. Features highly suspicious for bony metastatic disease. MRI brain 10/28/2018 1. No metastatic disease or acute intracranial abnormality identified. 2. 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  3. Moderately advanced but nonspecific cerebral white matter signal changes, most commonly due to chronic small vessel disease. Lesser signal changes in the deep gray matter and pons may also be small vessel related.  CT chest abdomen pelvis with contrast 12/15/2018 1 Marked response to therapy of thoracic adenopathy. 2. No new or progressive disease in the chest. 3. Coronary artery atherosclerosis. 4. 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 5. No new or progressive disease in the abdomen or pelvis 6. Pelvic floor laxity, cystocele with bladder wall thickening, suggesting a component of outlet obstruction. 7. Possible vague sclerosis within the posterior right acetabulum, in the region of hypermetabolism on prior PET. Likely an area of treated metastasis. 8. Central line tip in right brachiocephalic vein with surrounding occlusive thrombus. CT chest abdomen pelvis with contrast 01/24/2019 1. New extensive gas throughout the bladder wall and within the bladder lumen with underlying mild diffuse bladder wall thickening, compatible with acute emphysematous cystitis. 2. Two new subcentimeter low-attenuation lesions in the right kidney, nonspecific but worrisome for mild acute right pyelonephritis  given the above bladder findings. No discrete/drainable renal abscess. No hydronephrosis. 3. Continued positive response to therapy with continued reduction in mediastinal adenopathy. No residual measurable left lung mass.Stable faint sclerosis at the site of the hypermetabolic right acetabular osseous lesion. No new or progressive metastatic disease in the chest, abdomen or pelvis. 4. Small bowel containing small periumbilical hernia without bowel complication at this time. 5. Aortic Atherosclerosis (ICD10-I70.0) and Emphysema (ICD10-J43.9).  ASSESSMENT & PLAN:  1. Small cell lung cancer (HCC)   2. Dehydration   3. Diarrhea of infectious origin   4. Anemia due to antineoplastic chemotherapy   5. Thrombocytopenia (HCC)    #Extensive small cell lung cancer S/p 4 cycles of Carboplatin, Etoposide and Tecentriq. CT chest abdomen pelvis were independently reviewed by me and discussed with patient.   Continue t have positive response to therapy with continued reduction in mediastinal adenopathy.  No residual measurable left lung mass. Patient has had complications from chemotherapy includes symptomatic anemia, UTI and possible C. difficile colitis. Discussed with patient that I will hold chemotherapy and continue supportive care for now. Recommend patient to see radiation oncology to discuss about mediastinal radiation as well as whole brain radiation. Patient appears reluctant for whole brain radiation.  But she will talk to Dr. Baruch Gouty about mediastinal radiation.  #After radiation, plan to continue Tecentriq for maintenance. #Anemia, secondary to  anti-neoplasm treatment.  Hemoglobin 9.8 today.  Continue to monitor.  #Diarrhea despite using Imodium.  C. difficile antigen positive, toxin negative, indicating little to no toxin production however even that she is immunocompromised and continue to have symptoms.  We will proceed with vancomycin 125 mg 4 times daily for 10 days.  Offer patient for  IV fluid hydration.  Patient prefers to do oral oral hydration. Reevaluate in clinic in 1 week.   The patient knows to call the clinic with any problems questions or concerns.  Earlie Server, MD, PhD Hematology Oncology [facility] at [facility] Pager- [phone] 01/30/2019

## 2019-01-31 ENCOUNTER — Inpatient Hospital Stay: Payer: Medicare Other

## 2019-01-31 ENCOUNTER — Telehealth: Payer: Self-pay | Admitting: *Deleted

## 2019-01-31 MED ORDER — VANCOMYCIN HCL 125 MG PO CAPS: 125.0000 mg | ORAL_CAPSULE | Freq: Four times a day (QID) | ORAL | 0 refills | Status: DC

## 2019-01-31 NOTE — Telephone Encounter (Signed)
I switched to Vanocomycin caps if oral solution is not covered. Thanks.

## 2019-01-31 NOTE — Telephone Encounter (Signed)
Insurance does not cover her Vancomycin. Please advise

## 2019-01-31 NOTE — Telephone Encounter (Signed)
Per pharmacy the copay for new prescription is $3

## 2019-01-31 NOTE — Addendum Note (Signed)
Addended by: Earlie Server on: 01/31/2019 10:31 AM   Modules accepted: Orders

## 2019-02-01 ENCOUNTER — Inpatient Hospital Stay: Payer: Medicare Other

## 2019-02-02 ENCOUNTER — Ambulatory Visit: Payer: Medicare Other

## 2019-02-02 ENCOUNTER — Encounter: Payer: Self-pay | Admitting: Radiation Oncology

## 2019-02-02 ENCOUNTER — Other Ambulatory Visit: Payer: Self-pay | Admitting: *Deleted

## 2019-02-02 ENCOUNTER — Other Ambulatory Visit: Payer: Self-pay

## 2019-02-02 ENCOUNTER — Ambulatory Visit
Admission: RE | Admit: 2019-02-02 | Discharge: 2019-02-02 | Disposition: A | Discharge status: Home / Self Care | Payer: Medicare Other | Source: Ambulatory Visit | Attending: Radiation Oncology | Admitting: Radiation Oncology

## 2019-02-02 VITALS — BP 119/83 | HR 73 | Temp 97.4°F | Resp 18 | Wt 150.4 lb

## 2019-02-02 DIAGNOSIS — C3490 Malignant neoplasm of unspecified part of unspecified bronchus or lung: Secondary | ICD-10-CM

## 2019-02-02 DIAGNOSIS — M898X9 Other specified disorders of bone, unspecified site: Secondary | ICD-10-CM

## 2019-02-02 DIAGNOSIS — C781 Secondary malignant neoplasm of mediastinum: Secondary | ICD-10-CM | POA: Diagnosis not present

## 2019-02-02 DIAGNOSIS — C773 Secondary and unspecified malignant neoplasm of axilla and upper limb lymph nodes: Secondary | ICD-10-CM | POA: Diagnosis not present

## 2019-02-02 DIAGNOSIS — C3492 Malignant neoplasm of unspecified part of left bronchus or lung: Secondary | ICD-10-CM | POA: Diagnosis not present

## 2019-02-02 DIAGNOSIS — F17210 Nicotine dependence, cigarettes, uncomplicated: Secondary | ICD-10-CM | POA: Diagnosis not present

## 2019-02-02 DIAGNOSIS — C7951 Secondary malignant neoplasm of bone: Principal | ICD-10-CM

## 2019-02-02 MED ORDER — HYDROCODONE-ACETAMINOPHEN 5-325 MG PO TABS: 1.0000 | ORAL_TABLET | Freq: Two times a day (BID) | ORAL | 0 refills | Status: DC | PRN

## 2019-02-02 NOTE — Progress Notes (Signed)
Radiation Oncology Follow up Note  Name: Mercer Stallworth [facility]   Date:   02/02/2019 MRN:  [id number] DOB: 1946/12/07    This 73 y.o. female presents to the clinic today for reevaluation in patient with known stage IV small cell lung cancer with excellent response from initial chemotherapy.  REFERRING PROVIDER: Earlie Server, MD  HPI: patient is a 73 year old female originally consult back in November when she presented with extensive stage small cell lung cancer. This was diagnosed by bronchoscopy..initial PET CT scan showed a large left lung mass which was hypermetabolic bilateral hilar regions also involved. She also metastatic adenopathy in the right neck and supraclavicular region and lesion in the hepatoduodenal ligament consistent with metastatic disease. Also involvement of the right acetabulum.she is now status postStatus post 4 cycles of carbo, it etoposide, Tecentriq.which she tolerated well.recent CT scan shows marked response in her chest with continued reduction in mediastinal adenopathy. There was no residual disease in her left lung. Also some slight sclerosis at the site of the right acetabular osseous lesion. She is also been treated for cystitis by urology. She is seen today for consideration of palliative radiation therapy to residual disease in her mediastinum as well as possible whole brain radiation therapy to eradicate microscopic residual disease. She is doing fairly well. She's having no cough at this time she's having no dysphagia. She has some slight headaches occasionally.  COMPLICATIONS OF TREATMENT: none  FOLLOW UP COMPLIANCE: keeps appointments   PHYSICAL EXAM:  BP 119/83 (BP Location: Left Arm, Patient Position: Sitting)   Pulse 73   Temp (!) 97.4 F (36.3 C) (Tympanic)   Resp 18   Wt 150 lb 5.7 oz (68.2 kg)   BMI 25.02 kg/m  Well-developed well-nourished patient in NAD. HEENT reveals PERLA, EOMI, discs not visualized.  Oral cavity is clear. No oral mucosal lesions are  identified. Neck is clear without evidence of cervical or supraclavicular adenopathy. Lungs are clear to A&P. Cardiac examination is essentially unremarkable with regular rate and rhythm without murmur rub or thrill. Abdomen is benign with no organomegaly or masses noted. Motor sensory and DTR levels are equal and symmetric in the upper and lower extremities. Cranial nerves II through XII are grossly intact. Proprioception is intact. No peripheral adenopathy or edema is identified. No motor or sensory levels are noted. Crude visual fields are within normal range.  RADIOLOGY RESULTS: PET CT and CT scans and serial fashion have been reviewed  PLAN: at this time I to go ahead with palliative radiation therapy to her chest. Would plan on delivering 3960 cGy in 22 fractions. Risks and benefits of treatment including possible dysphasia from radiation esophagitis fatigue alteration of blood counts skin reaction all were described in detail to the patient and her husband. I have personally set up and ordered CT simulation for next week. After completion of her mediastinal radiation will go ahead and deliver palliative radiation therapy to her whole brain. I would plan on delivering 2500 cGy in 10 fractions. Risks and benefits of whole brain radiation therapy were discussed with the patient and her husband. They both seem to comprehend my treatment plan well.  I would like to take this opportunity to thank you for allowing me to participate in the care of your patient.[name], MD

## 2019-02-03 ENCOUNTER — Other Ambulatory Visit: Payer: Self-pay | Admitting: *Deleted

## 2019-02-03 ENCOUNTER — Ambulatory Visit: Payer: Medicare Other

## 2019-02-03 DIAGNOSIS — M898X9 Other specified disorders of bone, unspecified site: Secondary | ICD-10-CM

## 2019-02-03 MED ORDER — HYDROCODONE-ACETAMINOPHEN 5-325 MG PO TABS: 1.0000 | ORAL_TABLET | Freq: Two times a day (BID) | ORAL | 0 refills | Status: DC | PRN

## 2019-02-06 ENCOUNTER — Other Ambulatory Visit: Payer: Self-pay

## 2019-02-06 ENCOUNTER — Inpatient Hospital Stay (HOSPITAL_BASED_OUTPATIENT_CLINIC_OR_DEPARTMENT_OTHER): Payer: Medicare Other | Admitting: Oncology

## 2019-02-06 ENCOUNTER — Encounter: Payer: Self-pay | Admitting: Oncology

## 2019-02-06 ENCOUNTER — Inpatient Hospital Stay: Payer: Medicare Other

## 2019-02-06 VITALS — BP 99/73 | HR 75 | Temp 97.6°F | Resp 18 | Wt 148.4 lb

## 2019-02-06 DIAGNOSIS — D696 Thrombocytopenia, unspecified: Secondary | ICD-10-CM | POA: Diagnosis not present

## 2019-02-06 DIAGNOSIS — K219 Gastro-esophageal reflux disease without esophagitis: Secondary | ICD-10-CM | POA: Diagnosis not present

## 2019-02-06 DIAGNOSIS — D6481 Anemia due to antineoplastic chemotherapy: Secondary | ICD-10-CM | POA: Diagnosis not present

## 2019-02-06 DIAGNOSIS — C3490 Malignant neoplasm of unspecified part of unspecified bronchus or lung: Secondary | ICD-10-CM

## 2019-02-06 DIAGNOSIS — C3402 Malignant neoplasm of left main bronchus: Secondary | ICD-10-CM | POA: Diagnosis not present

## 2019-02-06 DIAGNOSIS — A09 Infectious gastroenteritis and colitis, unspecified: Secondary | ICD-10-CM | POA: Diagnosis not present

## 2019-02-06 DIAGNOSIS — E860 Dehydration: Secondary | ICD-10-CM | POA: Diagnosis not present

## 2019-02-06 DIAGNOSIS — R5383 Other fatigue: Secondary | ICD-10-CM

## 2019-02-06 DIAGNOSIS — Z79899 Other long term (current) drug therapy: Secondary | ICD-10-CM | POA: Diagnosis not present

## 2019-02-06 LAB — COMPREHENSIVE METABOLIC PANEL
ALT: 15 U/L (ref 0–44)
AST: 18 U/L (ref 15–41)
Albumin: 3.6 g/dL (ref 3.5–5.0)
Alkaline Phosphatase: 99 U/L (ref 38–126)
Anion gap: 9 (ref 5–15)
BUN: 12 mg/dL (ref 8–23)
CO2: 27 mmol/L (ref 22–32)
CREATININE: 0.64 mg/dL (ref 0.44–1.00)
Calcium: 9 mg/dL (ref 8.9–10.3)
Chloride: 99 mmol/L (ref 98–111)
GFR calc Af Amer: >60 mL/min (ref >60)
GFR calc non Af Amer: >60 mL/min (ref >60)
Glucose, Bld: 101 mg/dL — ABNORMAL HIGH (ref 70–99)
Potassium: 4 mmol/L (ref 3.5–5.1)
Sodium: 135 mmol/L (ref 135–145)
Total Bilirubin: 0.4 mg/dL (ref 0.3–1.2)
Total Protein: 7.2 g/dL (ref 6.5–8.1)

## 2019-02-06 LAB — CBC WITH DIFFERENTIAL/PLATELET
Abs Immature Granulocytes: 0.1 10*3/uL — ABNORMAL HIGH (ref 0.00–0.07)
Basophils Absolute: 0.1 10*3/uL (ref 0.0–0.1)
Basophils Relative: 1 %
EOS PCT: 1 %
Eosinophils Absolute: 0.1 10*3/uL (ref 0.0–0.5)
HCT: 32.4 % — ABNORMAL LOW (ref 36.0–46.0)
Hemoglobin: 10.6 g/dL — ABNORMAL LOW (ref 12.0–15.0)
Immature Granulocytes: 1 %
Lymphocytes Relative: 21 %
Lymphs Abs: 2.2 10*3/uL (ref 0.7–4.0)
MCH: 32.5 pg (ref 26.0–34.0)
MCHC: 32.7 g/dL (ref 30.0–36.0)
MCV: 99.4 fL (ref 80.0–100.0)
Monocytes Absolute: 1.4 10*3/uL — ABNORMAL HIGH (ref 0.1–1.0)
Monocytes Relative: 14 %
Neutro Abs: 6.3 10*3/uL (ref 1.7–7.7)
Neutrophils Relative %: 62 %
PLATELETS: 263 10*3/uL (ref 150–400)
RBC: 3.26 MIL/uL — ABNORMAL LOW (ref 3.87–5.11)
RDW: 17.5 % — ABNORMAL HIGH (ref 11.5–15.5)
WBC: 10.2 10*3/uL (ref 4.0–10.5)
nRBC: 0 % (ref 0.0–0.2)

## 2019-02-06 LAB — TSH: TSH: 1.359 u[IU]/mL (ref 0.350–4.500)

## 2019-02-06 NOTE — Progress Notes (Signed)
Patient here for follow up. Pt had a soft bowel movements for about a week and this morning she had one episode of diarrhea, she took 2 imodium pill.  Continues on antibiotic, no fever. Pt complaining of severe heartburn.

## 2019-02-06 NOTE — Progress Notes (Signed)
Hematology/Oncology Follow up note [facility] [phone]) [phone] [phone]   Patient Care Team: Glean Hess, MD as PCP - General (Internal Medicine) Charolette Forward, MD as Consulting Physician (Cardiology) Telford Nab, RN as Registered Nurse  REFERRING PROVIDER: Dr. 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 Patient recently was referred to see pulmonology Dr. Felicie Morn for persistent cough and dyspnea.  Patient reports coughing up clear sputum for about 2 months.  No hemoptysis.  She also had weight loss. Former smoker, quitting smoking 2 weeks ago.  39-pack-year smoking history  09/30/2018 chest x-ray showed new market volume loss in the left chest area of the left mainstem bronchus worrisome for centrally obstructing mass. 10/17/2018 chest x-ray showed volume loss worsened in the left hemithorax worsening patchy consolidation in the mid to lower left lung with minimal residual aeration and up in the left lung mass not excluded.  Possible small left pleural effusion. 10/31 2019 CT chest with contrast showed large mediastinal mass involving both hilar, left greater than right, consistent with lung carcinoma The mass causes narrowing of the left mainstem bronchus with resultant volume loss on the left and a mediastinal shift to the left.  Moderate size left pleural effusion. 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  Today patient was accompanied by husband and daughter to clinic to discuss diagnosis and management plan. She reports feeling tired feels shortness of breath with exertion.  Persistent coughing, productive with clear sputum. Also  reports difficulty swallowing, feels food sticking in her food pipe. MRI brain was obtained which was negative for intracranial metastatic disease.   01/24/2019 interim CT scan done which showed continued positive response to therapy with continued reduction in mediastinal adenopathy.  No residual measurable left lung mass. However she developed new extensive gas throughout the bladder wall and within the bladder lumen with underlying mild diffuse bladder wall thickening.  Concerning for acute emphysematous cystitis.  There is also to new subcentimeter low-attenuation lesion in the right kidney.  Nonspecific but worrisome for right pyelonephritis. I instructed patient to go to emergency room for further evaluation.  Patient was admitted for treatment for acute cystitis.  Urology was consulted and evaluate patient during her hospitalization.  Urology did not feel that patient has pyelonephritis and recommend treatment with antibiotics because patient's immunocompromise.  Patient was on IV cephapirin and then transitioned to p.o.cefpodoxime at discharge.  INTERVAL HISTORY Kristi [name] is a 73 y.o. female who has above history reviewed by me today presents for follow-up visit for diarrhea. Status post 4 cycles of carbo,  etoposide, Tecentriq.  Patient was seen by me 1 week ago for evaluation of severe diarrhea and electrolyte imbalance. Patient was started on oral vancomycin 125 mg every 4 hours for 10-day course for clinical C. difficile colitis.   Patient reports that diarrhea stopped 1 day after starting antibiotics and since then she has had formed bowel movement i until this morning she had one episode of loose stool.  She took Imodium 2 mg and have been had another bowel movement so far. Appetite is slightly better. Chronic fatigue has improved since last week. No other new complaints. She has also been referred back to Dr. Baruch Gouty  to discuss about chest radiation and whole brain  radiation. Patient will start simulation on 02/09/2019.  She has agreed to chest radiation and whole brain radiation.    Review of Systems  Constitutional: Positive for fatigue. Negative for appetite change, chills and fever.  HENT:   Negative for hearing loss and voice change.   Eyes: Negative for eye problems.  Respiratory: Positive for cough. Negative for chest tightness and shortness of breath.   Cardiovascular: Negative for chest pain.  Gastrointestinal: Positive for diarrhea. Negative for abdominal distention, abdominal pain and blood in stool.       Heartburn  Endocrine: Negative for hot flashes.  Genitourinary: Negative for difficulty urinating and frequency.   Musculoskeletal: Negative for arthralgias.  Skin: Negative for itching and rash.  Neurological: Negative for extremity weakness.  Hematological: Negative for adenopathy.  Psychiatric/Behavioral: Negative for confusion.     MEDICAL HISTORY:  Past Medical History:  Diagnosis Date  . Acute MI, inferoposterior wall ([place]) 09/30/2014  . Claustrophobia   . Coronary artery disease   . Hypercholesteremia   . Hypertension   . MI, old   . 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 [facility]) 10/27/2018    SURGICAL HISTORY: Past Surgical History:  Procedure Laterality Date  . APPENDECTOMY     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  . BLADDER NECK SUSPENSION    . CHOLECYSTECTOMY N/A 08/14/2018   Procedure: LAPAROSCOPIC CHOLECYSTECTOMY;  Surgeon: Jules Husbands, MD;  Location: [facility] ORS;  Service: General;  Laterality: N/A;  . CHOLECYSTECTOMY  11/2018  . CORONARY STENT PLACEMENT    . ENDOBRONCHIAL ULTRASOUND N/A 10/21/2018   Procedure: ENDOBRONCHIAL ULTRASOUND;  Surgeon: Laverle Hobby, MD;  Location: [facility] ORS;  Service: Pulmonary;  Laterality: N/A;  . HERNIA REPAIR    . IR FLUORO GUIDE CV LINE RIGHT  12/19/2018  . LEFT HEART CATHETERIZATION WITH CORONARY ANGIOGRAM N/A 09/29/2014   Procedure: LEFT HEART CATHETERIZATION WITH CORONARY  ANGIOGRAM;  Surgeon: Clent Demark, MD;  Location: [facility] CATH LAB;  Service: Cardiovascular;  Laterality: N/A;  . PORTA CATH INSERTION N/A 01/02/2019   Procedure: PORTA CATH INSERTION;  Surgeon: Algernon Huxley, MD;  Location: [facility] CV LAB;  Service: Cardiovascular;  Laterality: N/A;  . PORTACATH PLACEMENT Right 10/28/2018   Procedure: INSERTION PORT-A-CATH;  Surgeon: Jules Husbands, MD;  Location: [facility] ORS;  Service: General;  Laterality: Right;    SOCIAL HISTORY: Social History   Socioeconomic History  . Marital status: Married    Spouse name: [name]   . Number of children: 3  . Years of education: Not on file  . Highest education level: Not on file  Occupational History  . Occupation: Retired    Comment: Chief Technology Officer   Social Needs  . Financial resource strain: Not very hard  . Food insecurity:    Worry: Not on file    Inability: Not on file  . Transportation needs:    Medical: No    Non-medical: No  Tobacco Use  . Smoking status: Former Smoker    Packs/day: 1.00    Years: 39.00    Pack years: 39.00    Types: Cigarettes    Start date: 12/21/1978    Last attempt to quit: 10/16/2018    Years since quitting: 0.3  . Smokeless tobacco: Never Used  Substance and Sexual Activity  . Alcohol use: Yes    Alcohol/week: 0.0 standard drinks    Comment: occassional - approx 1 every 2 weeks   . Drug use:  No  . Sexual activity: Yes    Birth control/protection: None  Lifestyle  . Physical activity:    Days per week: 0 days    Minutes per session: Not on file  . Stress: To some extent  Relationships  . Social connections:    Talks on phone: Three times a week    Gets together: Once a week    Attends religious service: 1 to 4 times per year    Active member of club or organization: Not on file    Attends meetings of clubs or organizations: Not on file    Relationship status: Married  . Intimate partner violence:    Fear of current or ex partner: No    Emotionally abused: No     Physically abused: No    Forced sexual activity: No  Other Topics Concern  . Not on file  Social History Narrative  . Not on file    FAMILY HISTORY: Family History  Problem Relation Age of Onset  . Alzheimer's disease Mother   . Colon cancer Father   . Breast cancer Neg Hx     ALLERGIES:  has No Known Allergies.  MEDICATIONS:  Current Outpatient Medications  Medication Sig Dispense Refill  . albuterol (PROAIR HFA) 108 (90 Base) MCG/ACT inhaler Inhale 2 puffs into the lungs every 6 (six) hours as needed for wheezing or shortness of breath. 18 g 2  . aspirin EC 81 MG tablet Take 81 mg by mouth daily.    [name] Kitchen atorvastatin (LIPITOR) 80 MG tablet Take 0.5 tablets (40 mg total) by mouth daily at 6 PM. (Patient taking differently: Take 20 mg by mouth 2 (two) times daily. ) 30 tablet 3  . B Complex Vitamins (VITAMIN B COMPLEX PO) Take 1 tablet by mouth daily.     [name] Kitchen CALCIUM PO Take 1 tablet by mouth daily.     . Cholecalciferol (VITAMIN D) 2000 units CAPS Take 1 capsule (2,000 Units total) by mouth daily. 30 capsule   . HYDROcodone-acetaminophen (NORCO/VICODIN) 5-325 MG tablet Take 1 tablet by mouth every 12 (twelve) hours as needed for moderate pain. 30 tablet 0  . lidocaine-prilocaine (EMLA) cream Apply to affected area once 30 g 3  . metoprolol tartrate (LOPRESSOR) 25 MG tablet Take 12.5 mg by mouth 2 (two) times daily.    . nitroGLYCERIN (NITROSTAT) 0.4 MG SL tablet Place 1 tablet (0.4 mg total) under the tongue every 5 (five) minutes x 3 doses as needed for chest pain. 25 tablet 12  . omeprazole (PRILOSEC) 40 MG capsule TAKE 1 CAPSULE(40 MG) BY MOUTH DAILY 90 capsule 0  . ondansetron (ZOFRAN) 8 MG tablet Take 1 tablet (8 mg total) by mouth 2 (two) times daily as needed for refractory nausea / vomiting. Start on day 3 after carboplatin chemo. 30 tablet 1  . polyethylene glycol (MIRALAX / GLYCOLAX) packet Take 17 g by mouth daily as needed.     . potassium chloride SA (K-DUR,KLOR-CON) 20  MEQ tablet TAKE 1 TABLET(20 MEQ) BY MOUTH DAILY 90 tablet 1  . prochlorperazine (COMPAZINE) 10 MG tablet TAKE 1 TABLET BY MOUTH EVERY 6 HOURS AS NEEDED FOR NAUSEA OR VOMITING 337 tablet 1  . sertraline (ZOLOFT) 50 MG tablet Take 1 tablet (50 mg total) by mouth daily. 30 tablet 1  . sucralfate (CARAFATE) 1 g tablet TAKE 1 TABLET BY MOUTH FOUR TIMES DAILY, WITH MEALS& AT BEDTIME 368 tablet 0  . vancomycin (VANCOCIN) 125 MG capsule Take 1 capsule (125  mg total) by mouth 4 (four) times daily. 40 capsule 0  . benzonatate (TESSALON PERLES) 100 MG capsule Take 2 capsules (200 mg total) by mouth 3 (three) times daily. (Patient not taking: Reported on 02/02/2019) 90 capsule 2  . cefpodoxime (VANTIN) 200 MG tablet Take 1 tablet (200 mg total) by mouth every 12 (twelve) hours. (Patient not taking: Reported on 02/02/2019) 11 tablet 0  . dexamethasone (DECADRON) 4 MG tablet Take 8mg  daily for 2 days after chemtherapy treatments. (Patient not taking: Reported on 02/06/2019) 30 tablet 1  . ipratropium-albuterol (DUONEB) 0.5-2.5 (3) MG/3ML SOLN Take 3 mLs by nebulization 3 (three) times daily. DX. J44.9 (Patient not taking: Reported on 02/06/2019) 360 mL 1   No current facility-administered medications for this visit.    Facility-Administered Medications Ordered in Other Visits  Medication Dose Route Frequency Provider Last Rate Last Dose  . heparin lock flush 100 unit/mL  500 Units Intravenous Once Earlie Server, MD      . sodium chloride flush (NS) 0.9 % injection 10 mL  10 mL Intravenous PRN Earlie Server, MD   10 mL at 01/09/19 0820  . sodium chloride flush (NS) 0.9 % injection 10 mL  10 mL Intravenous PRN Earlie Server, MD   10 mL at 01/10/19 1400     PHYSICAL EXAMINATION: ECOG PERFORMANCE STATUS: 1 - Symptomatic but completely ambulatory Vitals:   02/06/19 1315  BP: 99/73  Pulse: 75  Resp: 18  Temp: 97.6 F (36.4 C)  SpO2: 96%   Filed Weights   02/06/19 1315  Weight: 148 lb 6.4 oz (67.3 kg)   Physical Exam   Constitutional: She is oriented to person, place, and time. No distress.  HENT:  Head: Normocephalic and atraumatic.  Nose: Nose normal.  Mouth/Throat: Oropharynx is clear and moist. No oropharyngeal exudate.  Eyes: Pupils are equal, round, and reactive to light. EOM are normal. No scleral icterus.  Neck: Normal range of motion. Neck supple.  Cardiovascular: Normal rate and regular rhythm.  No murmur heard. Pulmonary/Chest: Effort normal. No respiratory distress. She has no rales. She exhibits no tenderness.  Decreased breath sounds bilaterally.  Abdominal: Soft. She exhibits no distension. There is no abdominal tenderness.  Musculoskeletal: Normal range of motion.        General: No edema.  Neurological: She is alert and oriented to person, place, and time. No cranial nerve deficit. She exhibits normal muscle tone. Coordination normal.  Skin: Skin is warm and dry. She is not diaphoretic. No erythema.  Psychiatric: Affect normal.       LABORATORY DATA:  I have reviewed the data as listed Lab Results  Component Value Date   WBC 10.2 02/06/2019   HGB 10.6 (L) 02/06/2019   HCT 32.4 (L) 02/06/2019   MCV 99.4 02/06/2019   PLT 263 02/06/2019   Recent Labs    08/12/18 1438  01/25/19 0434 01/30/19 0825 02/06/19 1255  NA 131*   < > 140 136 135  K 3.7   < > 3.6 3.8 4.0  CL 97*   < > 110 106 99  CO2 25   < > 24 24 27   GLUCOSE 122*   < > 91 99 101*  BUN 9   < > 6* 10 12  CREATININE 0.74   < > 0.52 0.53 0.64  CALCIUM 9.1   < > 8.1* 9.0 9.0  GFRNONAA >60   < > >60 >60 >60  GFRAA >60   < > >60 >60 >60  PROT 8.0   < > 5.5* 6.8 7.2  ALBUMIN 4.3   < > 2.9* 3.5 3.6  AST 33   < > 14* 17 18  ALT 26   < > 12 13 15   ALKPHOS 81   < > 78 86 99  BILITOT 0.6   < > 0.5 0.4 0.4  BILIDIR 0.2  --   --   --   --   IBILI 0.4  --   --   --   --    < > = values in this interval not displayed.    RADIOGRAPHIC STUDIES: I have personally reviewed the radiological images as listed and agreed  with the findings in the report. 10/20/2018 CT chest w contrast . Large mediastinal mass involving both hila left much greater than right consistent with lung carcinoma possibly small cell lung carcinoma. This mass causes narrowing of the left mainstem bronchus with resultant volume loss on the left and mediastinal shift to the Left. 2. Moderate size left pleural effusion. 3. Coronary artery calcifications and moderate thoracic aortic atherosclerosis. PET 10/26/2018 . Large left lung mass is markedly hypermetabolic in the confluent lymphadenopathy in the mediastinum and in bilateral hilar regions also shows marked hypermetabolism. 2. Hypermetabolic metastatic lymphadenopathy in the right neck and supraclavicular region. 3. Hypermetabolic lymphadenopathy in the hepato duodenal ligament consistent with metastatic disease. 4. Large hypermetabolic lesion posterior right acetabulum without correlate of CT finding. Features highly suspicious for bony metastatic disease. MRI brain 10/28/2018 1. No metastatic disease or acute intracranial abnormality identified. 2. 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  3. Moderately advanced but nonspecific cerebral white matter signal changes, most commonly due to chronic small vessel disease. Lesser signal changes in the deep gray matter and pons may also be small vessel related.  CT chest abdomen pelvis with contrast 12/15/2018 1 Marked response to therapy of thoracic adenopathy. 2. No new or progressive disease in the chest. 3. Coronary artery atherosclerosis. 4. 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 5. No new or progressive disease in the abdomen or pelvis 6. Pelvic floor laxity, cystocele with bladder wall thickening, suggesting a component of outlet obstruction. 7. Possible vague sclerosis within the posterior right acetabulum, in the region of hypermetabolism on prior PET. Likely an area of treated metastasis. 8. Central line tip in  right brachiocephalic vein with surrounding occlusive thrombus. CT chest abdomen pelvis with contrast 01/24/2019 1. New extensive gas throughout the bladder wall and within the bladder lumen with underlying mild diffuse bladder wall thickening, compatible with acute emphysematous cystitis. 2. Two new subcentimeter low-attenuation lesions in the right kidney, nonspecific but worrisome for mild acute right pyelonephritis given the above bladder findings. No discrete/drainable renal abscess. No hydronephrosis. 3. Continued positive response to therapy with continued reduction in mediastinal adenopathy. No residual measurable left lung mass.Stable faint sclerosis at the site of the hypermetabolic right acetabular osseous lesion. No new or progressive metastatic disease in the chest, abdomen or pelvis. 4. Small bowel containing small periumbilical hernia without bowel complication at this time. 5. Aortic Atherosclerosis (ICD10-I70.0) and Emphysema (ICD10-J43.9).  ASSESSMENT & PLAN:  1. Small cell lung cancer ([place])   2. Diarrhea of infectious origin    #Extensive small cell lung cancer S/p 4 cycles of Carboplatin, Etoposide and Tecentriq. To start palliative chest radiation as well as whole brain radiation. Hold Tecentriq continue she finished radiation. I asked patient to call if any given update when she knows last date of radiation. Plan  to restart Tecentriq maintenance a few weeks after the last radiation.  #Clinical C. difficile colitis, diarrhea has significantly improved.  Recommend patient to finish course of vancomycin. Encourage oral hydration.  Labs reviewed with patient.  Electrolytes stable.  No need for IV fluid or IV electrolyte supplementation today. #GERD continue omeprazole and sucralfate.  Her symptoms may get worse during radiation course.  #Anemia, secondary to anti-neoplasm treatment.  Hemoglobin improved to 10.6 today.  The patient knows to call the clinic with any problems  questions or concerns. Return visit to be determined pending on patient's radiation scheduling.   Earlie Server, MD, PhD Hematology Oncology [facility] at [facility] Pager- [phone] 02/06/2019

## 2019-02-07 ENCOUNTER — Ambulatory Visit: Payer: Medicare Other

## 2019-02-09 ENCOUNTER — Ambulatory Visit
Admission: RE | Admit: 2019-02-09 | Discharge: 2019-02-09 | Disposition: A | Discharge status: Home / Self Care | Payer: Medicare Other | Source: Ambulatory Visit | Attending: Radiation Oncology | Admitting: Radiation Oncology

## 2019-02-09 DIAGNOSIS — C773 Secondary and unspecified malignant neoplasm of axilla and upper limb lymph nodes: Secondary | ICD-10-CM | POA: Diagnosis not present

## 2019-02-09 DIAGNOSIS — C781 Secondary malignant neoplasm of mediastinum: Secondary | ICD-10-CM | POA: Insufficient documentation

## 2019-02-09 DIAGNOSIS — Z510 Encounter for antineoplastic radiation therapy: Secondary | ICD-10-CM | POA: Diagnosis not present

## 2019-02-09 DIAGNOSIS — F17210 Nicotine dependence, cigarettes, uncomplicated: Secondary | ICD-10-CM | POA: Diagnosis not present

## 2019-02-09 DIAGNOSIS — C3492 Malignant neoplasm of unspecified part of left bronchus or lung: Secondary | ICD-10-CM | POA: Diagnosis not present

## 2019-02-10 ENCOUNTER — Other Ambulatory Visit: Payer: Self-pay | Admitting: *Deleted

## 2019-02-10 DIAGNOSIS — C3490 Malignant neoplasm of unspecified part of unspecified bronchus or lung: Secondary | ICD-10-CM

## 2019-02-10 DIAGNOSIS — C3492 Malignant neoplasm of unspecified part of left bronchus or lung: Secondary | ICD-10-CM | POA: Diagnosis not present

## 2019-02-10 DIAGNOSIS — C781 Secondary malignant neoplasm of mediastinum: Secondary | ICD-10-CM | POA: Diagnosis not present

## 2019-02-10 DIAGNOSIS — C773 Secondary and unspecified malignant neoplasm of axilla and upper limb lymph nodes: Secondary | ICD-10-CM | POA: Diagnosis not present

## 2019-02-10 DIAGNOSIS — Z510 Encounter for antineoplastic radiation therapy: Secondary | ICD-10-CM | POA: Diagnosis not present

## 2019-02-10 DIAGNOSIS — F17210 Nicotine dependence, cigarettes, uncomplicated: Secondary | ICD-10-CM | POA: Diagnosis not present

## 2019-02-14 ENCOUNTER — Telehealth: Payer: Self-pay | Admitting: *Deleted

## 2019-02-14 DIAGNOSIS — R0781 Pleurodynia: Secondary | ICD-10-CM

## 2019-02-14 DIAGNOSIS — C3490 Malignant neoplasm of unspecified part of unspecified bronchus or lung: Secondary | ICD-10-CM

## 2019-02-14 NOTE — Telephone Encounter (Signed)
Pt called in to report that is starting to experience increased pain in lower rib cage on both sides and right hip. States has increased activity level lately and thinks has contributed to her increased pain. Pt asks if her pain regimen can be adjusted. Per Dr. Tasia Catchings, pt can try taking hydrocodone every 6 hours as needed for her pain and if that does not work then may add long-acting pain medication. Instructed pt to call back tomorrow if pain is not better so we can send in a prescription for long-acting pain medication. Pt verbalized understanding.

## 2019-02-15 ENCOUNTER — Other Ambulatory Visit: Payer: Self-pay | Admitting: *Deleted

## 2019-02-15 DIAGNOSIS — C3490 Malignant neoplasm of unspecified part of unspecified bronchus or lung: Secondary | ICD-10-CM

## 2019-02-15 MED ORDER — OXYCODONE HCL ER 10 MG PO T12A: 10.0000 mg | EXTENDED_RELEASE_TABLET | Freq: Two times a day (BID) | ORAL | 0 refills | Status: DC

## 2019-02-15 NOTE — Addendum Note (Signed)
Addended by: Telford Nab on: 02/15/2019 10:01 AM   Modules accepted: Orders

## 2019-02-15 NOTE — Telephone Encounter (Signed)
Pt called back this morning to report that pain is not any better after taking hydrocodone every 6 hours. Per Dr. Tasia Catchings, will send in prescription for Oxycontin 10mg  BID. Also, will need xray of fib cage to rule out pathologic fracture. Orders have been placed. Pt made aware.

## 2019-02-16 ENCOUNTER — Ambulatory Visit
Admission: RE | Admit: 2019-02-16 | Discharge: 2019-02-16 | Disposition: A | Discharge status: Home / Self Care | Payer: Medicare Other | Attending: Oncology | Admitting: Oncology

## 2019-02-16 ENCOUNTER — Ambulatory Visit
Admission: RE | Admit: 2019-02-16 | Discharge: 2019-02-16 | Disposition: A | Discharge status: Home / Self Care | Payer: Medicare Other | Source: Ambulatory Visit | Attending: Oncology | Admitting: Oncology

## 2019-02-16 ENCOUNTER — Ambulatory Visit
Admission: RE | Admit: 2019-02-16 | Discharge: 2019-02-16 | Disposition: A | Discharge status: Home / Self Care | Payer: Medicare Other | Source: Ambulatory Visit | Attending: Radiation Oncology | Admitting: Radiation Oncology

## 2019-02-16 DIAGNOSIS — C773 Secondary and unspecified malignant neoplasm of axilla and upper limb lymph nodes: Secondary | ICD-10-CM | POA: Diagnosis not present

## 2019-02-16 DIAGNOSIS — R0781 Pleurodynia: Secondary | ICD-10-CM

## 2019-02-16 DIAGNOSIS — C3492 Malignant neoplasm of unspecified part of left bronchus or lung: Secondary | ICD-10-CM | POA: Diagnosis not present

## 2019-02-16 DIAGNOSIS — Z510 Encounter for antineoplastic radiation therapy: Secondary | ICD-10-CM | POA: Diagnosis not present

## 2019-02-16 DIAGNOSIS — C781 Secondary malignant neoplasm of mediastinum: Secondary | ICD-10-CM | POA: Diagnosis not present

## 2019-02-16 DIAGNOSIS — C3490 Malignant neoplasm of unspecified part of unspecified bronchus or lung: Secondary | ICD-10-CM

## 2019-02-16 DIAGNOSIS — F17210 Nicotine dependence, cigarettes, uncomplicated: Secondary | ICD-10-CM | POA: Diagnosis not present

## 2019-02-20 ENCOUNTER — Ambulatory Visit
Admission: RE | Admit: 2019-02-20 | Discharge: 2019-02-20 | Disposition: A | Discharge status: Home / Self Care | Payer: Medicare Other | Source: Ambulatory Visit | Attending: Radiation Oncology | Admitting: Radiation Oncology

## 2019-02-20 DIAGNOSIS — F17210 Nicotine dependence, cigarettes, uncomplicated: Secondary | ICD-10-CM | POA: Diagnosis not present

## 2019-02-20 DIAGNOSIS — C773 Secondary and unspecified malignant neoplasm of axilla and upper limb lymph nodes: Secondary | ICD-10-CM | POA: Diagnosis not present

## 2019-02-20 DIAGNOSIS — Z510 Encounter for antineoplastic radiation therapy: Secondary | ICD-10-CM | POA: Diagnosis not present

## 2019-02-20 DIAGNOSIS — C781 Secondary malignant neoplasm of mediastinum: Secondary | ICD-10-CM | POA: Diagnosis not present

## 2019-02-20 DIAGNOSIS — C3492 Malignant neoplasm of unspecified part of left bronchus or lung: Secondary | ICD-10-CM | POA: Diagnosis not present

## 2019-02-21 ENCOUNTER — Ambulatory Visit
Admission: RE | Admit: 2019-02-21 | Discharge: 2019-02-21 | Disposition: A | Discharge status: Home / Self Care | Payer: Medicare Other | Source: Ambulatory Visit | Attending: Radiation Oncology | Admitting: Radiation Oncology

## 2019-02-21 DIAGNOSIS — C781 Secondary malignant neoplasm of mediastinum: Secondary | ICD-10-CM | POA: Diagnosis not present

## 2019-02-21 DIAGNOSIS — Z510 Encounter for antineoplastic radiation therapy: Secondary | ICD-10-CM | POA: Diagnosis not present

## 2019-02-21 DIAGNOSIS — C773 Secondary and unspecified malignant neoplasm of axilla and upper limb lymph nodes: Secondary | ICD-10-CM | POA: Diagnosis not present

## 2019-02-21 DIAGNOSIS — F17210 Nicotine dependence, cigarettes, uncomplicated: Secondary | ICD-10-CM | POA: Diagnosis not present

## 2019-02-21 DIAGNOSIS — C3492 Malignant neoplasm of unspecified part of left bronchus or lung: Secondary | ICD-10-CM | POA: Diagnosis not present

## 2019-02-22 ENCOUNTER — Ambulatory Visit
Admission: RE | Admit: 2019-02-22 | Discharge: 2019-02-22 | Disposition: A | Discharge status: Home / Self Care | Payer: Medicare Other | Source: Ambulatory Visit | Attending: Radiation Oncology | Admitting: Radiation Oncology

## 2019-02-22 ENCOUNTER — Other Ambulatory Visit: Payer: Self-pay | Admitting: *Deleted

## 2019-02-22 DIAGNOSIS — C781 Secondary malignant neoplasm of mediastinum: Secondary | ICD-10-CM | POA: Diagnosis not present

## 2019-02-22 DIAGNOSIS — Z510 Encounter for antineoplastic radiation therapy: Secondary | ICD-10-CM | POA: Diagnosis not present

## 2019-02-22 DIAGNOSIS — F17210 Nicotine dependence, cigarettes, uncomplicated: Secondary | ICD-10-CM | POA: Diagnosis not present

## 2019-02-22 DIAGNOSIS — C773 Secondary and unspecified malignant neoplasm of axilla and upper limb lymph nodes: Secondary | ICD-10-CM | POA: Diagnosis not present

## 2019-02-22 DIAGNOSIS — C3492 Malignant neoplasm of unspecified part of left bronchus or lung: Secondary | ICD-10-CM | POA: Diagnosis not present

## 2019-02-22 MED ORDER — OMEPRAZOLE 40 MG PO CPDR: 40.0000 mg | DELAYED_RELEASE_CAPSULE | Freq: Every day | ORAL | 1 refills | Status: DC

## 2019-02-22 MED ORDER — SERTRALINE HCL 50 MG PO TABS: 50.0000 mg | ORAL_TABLET | Freq: Every day | ORAL | 1 refills | Status: DC

## 2019-02-22 NOTE — Addendum Note (Signed)
Addended by: Telford Nab on: 02/22/2019 10:50 AM   Modules accepted: Orders

## 2019-02-23 ENCOUNTER — Ambulatory Visit
Admission: RE | Admit: 2019-02-23 | Discharge: 2019-02-23 | Disposition: A | Discharge status: Home / Self Care | Payer: Medicare Other | Source: Ambulatory Visit | Attending: Radiation Oncology | Admitting: Radiation Oncology

## 2019-02-23 DIAGNOSIS — Z510 Encounter for antineoplastic radiation therapy: Secondary | ICD-10-CM | POA: Diagnosis not present

## 2019-02-23 DIAGNOSIS — C3492 Malignant neoplasm of unspecified part of left bronchus or lung: Secondary | ICD-10-CM | POA: Diagnosis not present

## 2019-02-23 DIAGNOSIS — C781 Secondary malignant neoplasm of mediastinum: Secondary | ICD-10-CM | POA: Diagnosis not present

## 2019-02-23 DIAGNOSIS — C773 Secondary and unspecified malignant neoplasm of axilla and upper limb lymph nodes: Secondary | ICD-10-CM | POA: Diagnosis not present

## 2019-02-23 DIAGNOSIS — F17210 Nicotine dependence, cigarettes, uncomplicated: Secondary | ICD-10-CM | POA: Diagnosis not present

## 2019-02-24 ENCOUNTER — Ambulatory Visit
Admission: RE | Admit: 2019-02-24 | Discharge: 2019-02-24 | Disposition: A | Discharge status: Home / Self Care | Payer: Medicare Other | Source: Ambulatory Visit | Attending: Radiation Oncology | Admitting: Radiation Oncology

## 2019-02-24 DIAGNOSIS — Z510 Encounter for antineoplastic radiation therapy: Secondary | ICD-10-CM | POA: Diagnosis not present

## 2019-02-24 DIAGNOSIS — F17210 Nicotine dependence, cigarettes, uncomplicated: Secondary | ICD-10-CM | POA: Diagnosis not present

## 2019-02-24 DIAGNOSIS — C3492 Malignant neoplasm of unspecified part of left bronchus or lung: Secondary | ICD-10-CM | POA: Diagnosis not present

## 2019-02-24 DIAGNOSIS — C773 Secondary and unspecified malignant neoplasm of axilla and upper limb lymph nodes: Secondary | ICD-10-CM | POA: Diagnosis not present

## 2019-02-24 DIAGNOSIS — C781 Secondary malignant neoplasm of mediastinum: Secondary | ICD-10-CM | POA: Diagnosis not present

## 2019-02-27 ENCOUNTER — Ambulatory Visit
Admission: RE | Admit: 2019-02-27 | Discharge: 2019-02-27 | Disposition: A | Discharge status: Home / Self Care | Payer: Medicare Other | Source: Ambulatory Visit | Attending: Radiation Oncology | Admitting: Radiation Oncology

## 2019-02-27 ENCOUNTER — Encounter (INDEPENDENT_AMBULATORY_CARE_PROVIDER_SITE_OTHER): Payer: Self-pay

## 2019-02-27 DIAGNOSIS — C3492 Malignant neoplasm of unspecified part of left bronchus or lung: Secondary | ICD-10-CM | POA: Diagnosis not present

## 2019-02-27 DIAGNOSIS — C781 Secondary malignant neoplasm of mediastinum: Secondary | ICD-10-CM | POA: Diagnosis not present

## 2019-02-27 DIAGNOSIS — F17210 Nicotine dependence, cigarettes, uncomplicated: Secondary | ICD-10-CM | POA: Diagnosis not present

## 2019-02-27 DIAGNOSIS — Z510 Encounter for antineoplastic radiation therapy: Secondary | ICD-10-CM | POA: Diagnosis not present

## 2019-02-27 DIAGNOSIS — C773 Secondary and unspecified malignant neoplasm of axilla and upper limb lymph nodes: Secondary | ICD-10-CM | POA: Diagnosis not present

## 2019-02-28 ENCOUNTER — Other Ambulatory Visit: Payer: Self-pay | Admitting: *Deleted

## 2019-02-28 ENCOUNTER — Ambulatory Visit
Admission: RE | Admit: 2019-02-28 | Discharge: 2019-02-28 | Disposition: A | Discharge status: Home / Self Care | Payer: Medicare Other | Source: Ambulatory Visit | Attending: Radiation Oncology | Admitting: Radiation Oncology

## 2019-02-28 DIAGNOSIS — C3490 Malignant neoplasm of unspecified part of unspecified bronchus or lung: Secondary | ICD-10-CM

## 2019-02-28 DIAGNOSIS — C3492 Malignant neoplasm of unspecified part of left bronchus or lung: Secondary | ICD-10-CM | POA: Diagnosis not present

## 2019-02-28 DIAGNOSIS — C781 Secondary malignant neoplasm of mediastinum: Secondary | ICD-10-CM | POA: Diagnosis not present

## 2019-02-28 DIAGNOSIS — Z510 Encounter for antineoplastic radiation therapy: Secondary | ICD-10-CM | POA: Diagnosis not present

## 2019-02-28 DIAGNOSIS — F17210 Nicotine dependence, cigarettes, uncomplicated: Secondary | ICD-10-CM | POA: Diagnosis not present

## 2019-02-28 DIAGNOSIS — C773 Secondary and unspecified malignant neoplasm of axilla and upper limb lymph nodes: Secondary | ICD-10-CM | POA: Diagnosis not present

## 2019-02-28 MED ORDER — OXYCODONE HCL ER 10 MG PO T12A: 10.0000 mg | EXTENDED_RELEASE_TABLET | Freq: Two times a day (BID) | ORAL | 0 refills | Status: DC

## 2019-02-28 NOTE — Telephone Encounter (Signed)
Pt requests refill of Oxycontin. States that it has been working well to control her pain.

## 2019-03-01 ENCOUNTER — Ambulatory Visit
Admission: RE | Admit: 2019-03-01 | Discharge: 2019-03-01 | Disposition: A | Discharge status: Home / Self Care | Payer: Medicare Other | Source: Ambulatory Visit | Attending: Radiation Oncology | Admitting: Radiation Oncology

## 2019-03-01 DIAGNOSIS — C773 Secondary and unspecified malignant neoplasm of axilla and upper limb lymph nodes: Secondary | ICD-10-CM | POA: Diagnosis not present

## 2019-03-01 DIAGNOSIS — F17210 Nicotine dependence, cigarettes, uncomplicated: Secondary | ICD-10-CM | POA: Diagnosis not present

## 2019-03-01 DIAGNOSIS — C3492 Malignant neoplasm of unspecified part of left bronchus or lung: Secondary | ICD-10-CM | POA: Diagnosis not present

## 2019-03-01 DIAGNOSIS — Z510 Encounter for antineoplastic radiation therapy: Secondary | ICD-10-CM | POA: Diagnosis not present

## 2019-03-01 DIAGNOSIS — C781 Secondary malignant neoplasm of mediastinum: Secondary | ICD-10-CM | POA: Diagnosis not present

## 2019-03-02 ENCOUNTER — Other Ambulatory Visit: Payer: Self-pay

## 2019-03-02 ENCOUNTER — Ambulatory Visit
Admission: RE | Admit: 2019-03-02 | Discharge: 2019-03-02 | Disposition: A | Discharge status: Home / Self Care | Payer: Medicare Other | Source: Ambulatory Visit | Attending: Radiation Oncology | Admitting: Radiation Oncology

## 2019-03-02 ENCOUNTER — Inpatient Hospital Stay: Payer: Medicare Other | Attending: Radiation Oncology

## 2019-03-02 DIAGNOSIS — E861 Hypovolemia: Secondary | ICD-10-CM | POA: Insufficient documentation

## 2019-03-02 DIAGNOSIS — I252 Old myocardial infarction: Secondary | ICD-10-CM | POA: Insufficient documentation

## 2019-03-02 DIAGNOSIS — R112 Nausea with vomiting, unspecified: Secondary | ICD-10-CM | POA: Insufficient documentation

## 2019-03-02 DIAGNOSIS — R0789 Other chest pain: Secondary | ICD-10-CM | POA: Diagnosis not present

## 2019-03-02 DIAGNOSIS — C3492 Malignant neoplasm of unspecified part of left bronchus or lung: Secondary | ICD-10-CM | POA: Diagnosis not present

## 2019-03-02 DIAGNOSIS — Z510 Encounter for antineoplastic radiation therapy: Secondary | ICD-10-CM | POA: Diagnosis not present

## 2019-03-02 DIAGNOSIS — E876 Hypokalemia: Secondary | ICD-10-CM | POA: Diagnosis not present

## 2019-03-02 DIAGNOSIS — K208 Other esophagitis: Secondary | ICD-10-CM | POA: Insufficient documentation

## 2019-03-02 DIAGNOSIS — R197 Diarrhea, unspecified: Secondary | ICD-10-CM | POA: Insufficient documentation

## 2019-03-02 DIAGNOSIS — I9589 Other hypotension: Secondary | ICD-10-CM | POA: Diagnosis not present

## 2019-03-02 DIAGNOSIS — N179 Acute kidney failure, unspecified: Secondary | ICD-10-CM | POA: Diagnosis not present

## 2019-03-02 DIAGNOSIS — C3402 Malignant neoplasm of left main bronchus: Secondary | ICD-10-CM | POA: Diagnosis not present

## 2019-03-02 DIAGNOSIS — Z87891 Personal history of nicotine dependence: Secondary | ICD-10-CM | POA: Diagnosis not present

## 2019-03-02 DIAGNOSIS — I2510 Atherosclerotic heart disease of native coronary artery without angina pectoris: Secondary | ICD-10-CM | POA: Insufficient documentation

## 2019-03-02 DIAGNOSIS — C773 Secondary and unspecified malignant neoplasm of axilla and upper limb lymph nodes: Secondary | ICD-10-CM | POA: Diagnosis not present

## 2019-03-02 DIAGNOSIS — C3490 Malignant neoplasm of unspecified part of unspecified bronchus or lung: Secondary | ICD-10-CM

## 2019-03-02 DIAGNOSIS — C781 Secondary malignant neoplasm of mediastinum: Secondary | ICD-10-CM | POA: Diagnosis not present

## 2019-03-02 DIAGNOSIS — F17210 Nicotine dependence, cigarettes, uncomplicated: Secondary | ICD-10-CM | POA: Diagnosis not present

## 2019-03-02 LAB — CBC
HCT: 34.6 % — ABNORMAL LOW (ref 36.0–46.0)
Hemoglobin: 11.2 g/dL — ABNORMAL LOW (ref 12.0–15.0)
MCH: 31.5 pg (ref 26.0–34.0)
MCHC: 32.4 g/dL (ref 30.0–36.0)
MCV: 97.5 fL (ref 80.0–100.0)
PLATELETS: 139 10*3/uL — AB (ref 150–400)
RBC: 3.55 MIL/uL — AB (ref 3.87–5.11)
RDW: 12.8 % (ref 11.5–15.5)
WBC: 7.1 10*3/uL (ref 4.0–10.5)
nRBC: 0 % (ref 0.0–0.2)

## 2019-03-03 ENCOUNTER — Ambulatory Visit
Admission: RE | Admit: 2019-03-03 | Discharge: 2019-03-03 | Disposition: A | Discharge status: Home / Self Care | Payer: Medicare Other | Source: Ambulatory Visit | Attending: Radiation Oncology | Admitting: Radiation Oncology

## 2019-03-03 ENCOUNTER — Other Ambulatory Visit: Payer: Self-pay

## 2019-03-03 DIAGNOSIS — Z510 Encounter for antineoplastic radiation therapy: Secondary | ICD-10-CM | POA: Diagnosis not present

## 2019-03-03 DIAGNOSIS — F17210 Nicotine dependence, cigarettes, uncomplicated: Secondary | ICD-10-CM | POA: Diagnosis not present

## 2019-03-03 DIAGNOSIS — C3492 Malignant neoplasm of unspecified part of left bronchus or lung: Secondary | ICD-10-CM | POA: Diagnosis not present

## 2019-03-03 DIAGNOSIS — C781 Secondary malignant neoplasm of mediastinum: Secondary | ICD-10-CM | POA: Diagnosis not present

## 2019-03-03 DIAGNOSIS — C773 Secondary and unspecified malignant neoplasm of axilla and upper limb lymph nodes: Secondary | ICD-10-CM | POA: Diagnosis not present

## 2019-03-06 ENCOUNTER — Ambulatory Visit
Admission: RE | Admit: 2019-03-06 | Discharge: 2019-03-06 | Disposition: A | Discharge status: Home / Self Care | Payer: Medicare Other | Source: Ambulatory Visit | Attending: Radiation Oncology | Admitting: Radiation Oncology

## 2019-03-06 ENCOUNTER — Other Ambulatory Visit: Payer: Self-pay

## 2019-03-06 DIAGNOSIS — C3492 Malignant neoplasm of unspecified part of left bronchus or lung: Secondary | ICD-10-CM | POA: Diagnosis not present

## 2019-03-06 DIAGNOSIS — Z510 Encounter for antineoplastic radiation therapy: Secondary | ICD-10-CM | POA: Diagnosis not present

## 2019-03-06 DIAGNOSIS — C781 Secondary malignant neoplasm of mediastinum: Secondary | ICD-10-CM | POA: Diagnosis not present

## 2019-03-06 DIAGNOSIS — F17210 Nicotine dependence, cigarettes, uncomplicated: Secondary | ICD-10-CM | POA: Diagnosis not present

## 2019-03-06 DIAGNOSIS — C773 Secondary and unspecified malignant neoplasm of axilla and upper limb lymph nodes: Secondary | ICD-10-CM | POA: Diagnosis not present

## 2019-03-07 ENCOUNTER — Inpatient Hospital Stay (HOSPITAL_BASED_OUTPATIENT_CLINIC_OR_DEPARTMENT_OTHER): Payer: Medicare Other | Admitting: Oncology

## 2019-03-07 ENCOUNTER — Inpatient Hospital Stay: Payer: Medicare Other

## 2019-03-07 ENCOUNTER — Ambulatory Visit: Payer: Medicare Other

## 2019-03-07 ENCOUNTER — Ambulatory Visit
Admission: RE | Admit: 2019-03-07 | Discharge: 2019-03-07 | Disposition: A | Discharge status: Home / Self Care | Payer: Medicare Other | Source: Ambulatory Visit | Attending: Radiation Oncology | Admitting: Radiation Oncology

## 2019-03-07 ENCOUNTER — Other Ambulatory Visit: Payer: Self-pay

## 2019-03-07 ENCOUNTER — Encounter: Payer: Self-pay | Admitting: Oncology

## 2019-03-07 ENCOUNTER — Other Ambulatory Visit: Payer: Self-pay | Admitting: *Deleted

## 2019-03-07 VITALS — BP 92/50 | HR 98 | Temp 98.0°F | Resp 18

## 2019-03-07 DIAGNOSIS — R0789 Other chest pain: Secondary | ICD-10-CM

## 2019-03-07 DIAGNOSIS — E861 Hypovolemia: Secondary | ICD-10-CM

## 2019-03-07 DIAGNOSIS — I252 Old myocardial infarction: Secondary | ICD-10-CM

## 2019-03-07 DIAGNOSIS — C3492 Malignant neoplasm of unspecified part of left bronchus or lung: Secondary | ICD-10-CM | POA: Diagnosis not present

## 2019-03-07 DIAGNOSIS — C3402 Malignant neoplasm of left main bronchus: Secondary | ICD-10-CM

## 2019-03-07 DIAGNOSIS — Z510 Encounter for antineoplastic radiation therapy: Secondary | ICD-10-CM | POA: Diagnosis not present

## 2019-03-07 DIAGNOSIS — K208 Other esophagitis without bleeding: Secondary | ICD-10-CM

## 2019-03-07 DIAGNOSIS — E860 Dehydration: Secondary | ICD-10-CM

## 2019-03-07 DIAGNOSIS — Z87891 Personal history of nicotine dependence: Secondary | ICD-10-CM | POA: Diagnosis not present

## 2019-03-07 DIAGNOSIS — R112 Nausea with vomiting, unspecified: Secondary | ICD-10-CM | POA: Diagnosis not present

## 2019-03-07 DIAGNOSIS — N179 Acute kidney failure, unspecified: Secondary | ICD-10-CM

## 2019-03-07 DIAGNOSIS — I2510 Atherosclerotic heart disease of native coronary artery without angina pectoris: Secondary | ICD-10-CM

## 2019-03-07 DIAGNOSIS — E876 Hypokalemia: Secondary | ICD-10-CM | POA: Diagnosis not present

## 2019-03-07 DIAGNOSIS — R197 Diarrhea, unspecified: Secondary | ICD-10-CM

## 2019-03-07 DIAGNOSIS — I9589 Other hypotension: Secondary | ICD-10-CM

## 2019-03-07 DIAGNOSIS — T66XXXA Radiation sickness, unspecified, initial encounter: Secondary | ICD-10-CM

## 2019-03-07 DIAGNOSIS — F17210 Nicotine dependence, cigarettes, uncomplicated: Secondary | ICD-10-CM | POA: Diagnosis not present

## 2019-03-07 DIAGNOSIS — C773 Secondary and unspecified malignant neoplasm of axilla and upper limb lymph nodes: Secondary | ICD-10-CM | POA: Diagnosis not present

## 2019-03-07 DIAGNOSIS — R079 Chest pain, unspecified: Secondary | ICD-10-CM

## 2019-03-07 DIAGNOSIS — R5383 Other fatigue: Secondary | ICD-10-CM

## 2019-03-07 DIAGNOSIS — C3490 Malignant neoplasm of unspecified part of unspecified bronchus or lung: Secondary | ICD-10-CM

## 2019-03-07 DIAGNOSIS — C781 Secondary malignant neoplasm of mediastinum: Secondary | ICD-10-CM | POA: Diagnosis not present

## 2019-03-07 LAB — COMPREHENSIVE METABOLIC PANEL
ALK PHOS: 159 U/L — AB (ref 38–126)
ALT: 30 U/L (ref 0–44)
AST: 34 U/L (ref 15–41)
Albumin: 4.2 g/dL (ref 3.5–5.0)
Anion gap: 13 (ref 5–15)
BUN: 20 mg/dL (ref 8–23)
CALCIUM: 9.1 mg/dL (ref 8.9–10.3)
CO2: 22 mmol/L (ref 22–32)
Chloride: 97 mmol/L — ABNORMAL LOW (ref 98–111)
Creatinine, Ser: 1.23 mg/dL — ABNORMAL HIGH (ref 0.44–1.00)
GFR calc Af Amer: 51 mL/min — ABNORMAL LOW (ref >60)
GFR calc non Af Amer: 44 mL/min — ABNORMAL LOW (ref >60)
Glucose, Bld: 121 mg/dL — ABNORMAL HIGH (ref 70–99)
Potassium: 3.3 mmol/L — ABNORMAL LOW (ref 3.5–5.1)
Sodium: 132 mmol/L — ABNORMAL LOW (ref 135–145)
Total Bilirubin: 0.8 mg/dL (ref 0.3–1.2)
Total Protein: 8.1 g/dL (ref 6.5–8.1)

## 2019-03-07 LAB — CBC WITH DIFFERENTIAL/PLATELET
Abs Immature Granulocytes: 0.05 10*3/uL (ref 0.00–0.07)
Basophils Absolute: 0 10*3/uL (ref 0.0–0.1)
Basophils Relative: 0 %
EOS ABS: 0.3 10*3/uL (ref 0.0–0.5)
EOS PCT: 3 %
HCT: 40.1 % (ref 36.0–46.0)
Hemoglobin: 13.4 g/dL (ref 12.0–15.0)
Immature Granulocytes: 1 %
Lymphocytes Relative: 9 %
Lymphs Abs: 0.8 10*3/uL (ref 0.7–4.0)
MCH: 31.8 pg (ref 26.0–34.0)
MCHC: 33.4 g/dL (ref 30.0–36.0)
MCV: 95.2 fL (ref 80.0–100.0)
Monocytes Absolute: 0.9 10*3/uL (ref 0.1–1.0)
Monocytes Relative: 10 %
Neutro Abs: 7 10*3/uL (ref 1.7–7.7)
Neutrophils Relative %: 77 %
Platelets: 158 10*3/uL (ref 150–400)
RBC: 4.21 MIL/uL (ref 3.87–5.11)
RDW: 12.5 % (ref 11.5–15.5)
WBC: 9 10*3/uL (ref 4.0–10.5)
nRBC: 0 % (ref 0.0–0.2)

## 2019-03-07 LAB — MAGNESIUM: Magnesium: 1.7 mg/dL (ref 1.7–2.4)

## 2019-03-07 LAB — TROPONIN I: Troponin I: <0.03 ng/mL (ref <0.03)

## 2019-03-07 MED ORDER — HEPARIN SOD (PORK) LOCK FLUSH 100 UNIT/ML IV SOLN
500.0000 [IU] | Freq: Once | INTRAVENOUS | Status: AC | PRN
  Administered 2019-03-07: 500 [IU]
  Filled 2019-03-07: qty 5

## 2019-03-07 MED ORDER — ONDANSETRON HCL 4 MG/2ML IJ SOLN
8.0000 mg | Freq: Once | INTRAMUSCULAR | Status: AC
  Administered 2019-03-07: 8 mg via INTRAVENOUS
  Filled 2019-03-07: qty 4

## 2019-03-07 MED ORDER — SODIUM CHLORIDE 0.9 % IV SOLN: 8.0000 mg | Freq: Once | INTRAVENOUS | Status: DC

## 2019-03-07 MED ORDER — SODIUM CHLORIDE 0.9% FLUSH
10.0000 mL | Freq: Once | INTRAVENOUS | Status: AC | PRN
  Administered 2019-03-07: 10 mL
  Filled 2019-03-07: qty 10

## 2019-03-07 MED ORDER — POTASSIUM CHLORIDE 20 MEQ/100ML IV SOLN
20.0000 meq | Freq: Once | INTRAVENOUS | Status: AC
  Administered 2019-03-07: 20 meq via INTRAVENOUS

## 2019-03-07 MED ORDER — SODIUM CHLORIDE 0.9 % IV SOLN
Freq: Once | INTRAVENOUS | Status: AC
  Administered 2019-03-07: 12:00:00 via INTRAVENOUS
  Filled 2019-03-07: qty 250

## 2019-03-07 NOTE — Progress Notes (Signed)
Hematology/Oncology Follow up note [facility] Telephone:([phone] Fax:([phone]   Patient Care Team: Glean Hess, MD as PCP - General (Internal Medicine) Charolette Forward, MD as Consulting Physician (Cardiology) Telford Nab, RN as Registered Nurse  REFERRING PROVIDER: Dr. 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 Patient recently was referred to see pulmonology Dr. Felicie Morn for persistent cough and dyspnea.  Patient reports coughing up clear sputum for about 2 months.  No hemoptysis.  She also had weight loss. Former smoker, quitting smoking 2 weeks ago.  39-pack-year smoking history  09/30/2018 chest x-ray showed new market volume loss in the left chest area of the left mainstem bronchus worrisome for centrally obstructing mass. 10/17/2018 chest x-ray showed volume loss worsened in the left hemithorax worsening patchy consolidation in the mid to lower left lung with minimal residual aeration and up in the left lung mass not excluded.  Possible small left pleural effusion. 10/31 2019 CT chest with contrast showed large mediastinal mass involving both hilar, left greater than right, consistent with lung carcinoma The mass causes narrowing of the left mainstem bronchus with resultant volume loss on the left and a mediastinal shift to the left.  Moderate size left pleural effusion. 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  Today patient was accompanied by husband and daughter to clinic to discuss diagnosis and management plan. She reports feeling tired feels shortness of breath with exertion.  Persistent coughing, productive with clear sputum. Also  reports difficulty swallowing, feels food sticking in her food pipe. MRI brain was obtained which was negative for intracranial metastatic disease.   01/24/2019 interim CT scan done which showed continued positive response to therapy with continued reduction in mediastinal adenopathy.  No residual measurable left lung mass. However she developed new extensive gas throughout the bladder wall and within the bladder lumen with underlying mild diffuse bladder wall thickening.  Concerning for acute emphysematous cystitis.  There is also to new subcentimeter low-attenuation lesion in the right kidney.  Nonspecific but worrisome for right pyelonephritis. I instructed patient to go to emergency room for further evaluation.  Patient was admitted for treatment for acute cystitis.  Urology was consulted and evaluate patient during her hospitalization.  Urology did not feel that patient has pyelonephritis and recommend treatment with antibiotics because patient's immunocompromise.  Patient was on IV cephapirin and then transitioned to p.o.cefpodoxime at discharge.  INTERVAL HISTORY Kristi [name] is a 73 y.o. female who has above history reviewed by me today presents for acute visit for evaluation of hypotension and nausea. Status post 4 cycles of carbo,  etoposide, Tecentriq. Patient is currently undergoing chest radiation and plan on whole brain radiation in the next few weeks. Patient reports that she has been doing fine until yesterday that she started to feel more nauseated.  She threw up yesterday and also this morning.  She took her home antiemetics. Also reports intermittent diarrhea, 2-3 episodes a day, partially improved after Imodium. Denies any fever, chills, abdominal pain, chest pain, lower extremity swelling.  Review of Systems  Constitutional: Positive for fatigue. Negative for appetite change, chills and fever.  HENT:   Negative for hearing loss and voice change.   Eyes: Negative  for eye  problems.  Respiratory: Positive for cough. Negative for chest tightness and shortness of breath.   Cardiovascular: Negative for chest pain.  Gastrointestinal: Positive for diarrhea and nausea. Negative for abdominal distention, abdominal pain and blood in stool.       Heartburn  Endocrine: Negative for hot flashes.  Genitourinary: Negative for difficulty urinating and frequency.   Musculoskeletal: Negative for arthralgias.  Skin: Negative for itching and rash.  Neurological: Negative for extremity weakness.  Hematological: Negative for adenopathy.  Psychiatric/Behavioral: Negative for confusion.     MEDICAL HISTORY:  Past Medical History:  Diagnosis Date   Acute MI, inferoposterior wall ([place]) 09/30/2014   Claustrophobia    Coronary artery disease    Hypercholesteremia    Hypertension    MI, old    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 ([place]) 10/27/2018    SURGICAL HISTORY: Past Surgical History:  Procedure Laterality Date   APPENDECTOMY     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   BLADDER NECK SUSPENSION     CHOLECYSTECTOMY N/A 08/14/2018   Procedure: LAPAROSCOPIC CHOLECYSTECTOMY;  Surgeon: Jules Husbands, MD;  Location: [facility] ORS;  Service: General;  Laterality: N/A;   CHOLECYSTECTOMY  11/2018   CORONARY STENT PLACEMENT     ENDOBRONCHIAL ULTRASOUND N/A 10/21/2018   Procedure: ENDOBRONCHIAL ULTRASOUND;  Surgeon: Laverle Hobby, MD;  Location: [facility] ORS;  Service: Pulmonary;  Laterality: N/A;   HERNIA REPAIR     IR FLUORO GUIDE CV LINE RIGHT  12/19/2018   LEFT HEART CATHETERIZATION WITH CORONARY ANGIOGRAM N/A 09/29/2014   Procedure: LEFT HEART CATHETERIZATION WITH CORONARY ANGIOGRAM;  Surgeon: Clent Demark, MD;  Location: [place] [facility];  Service: Cardiovascular;  Laterality: N/A;   PORTA CATH INSERTION N/A 01/02/2019   Procedure: PORTA CATH INSERTION;  Surgeon: Algernon Huxley, MD;  Location: [place] [facility];  Service: Cardiovascular;  Laterality: N/A;   PORTACATH  PLACEMENT Right 10/28/2018   Procedure: INSERTION PORT-A-CATH;  Surgeon: Jules Husbands, MD;  Location: [facility] ORS;  Service: General;  Laterality: Right;    SOCIAL HISTORY: Social History   Socioeconomic History   Marital status: Married    Spouse name: [name]    Number of children: 3   Years of education: Not on file   Highest education level: Not on file  Occupational History   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 strain: Not very hard   Food insecurity:    Worry: Not on file    Inability: Not on file   Transportation needs:    Medical: No    Non-medical: No  Tobacco Use   Smoking status: Former Smoker    Packs/day: 1.00    Years: 39.00    Pack years: 39.00    Types: Cigarettes    Start date: 12/21/1978    Last attempt to quit: 10/16/2018    Years since quitting: 0.3   Smokeless tobacco: Never Used  Substance and Sexual Activity   Alcohol use: Yes    Alcohol/week: 0.0 standard drinks    Comment: occassional - approx 1 every 2 weeks    Drug use: No   Sexual activity: Yes    Birth control/protection: None  Lifestyle   Physical activity:    Days per week: 0 days    Minutes per session: Not on file   Stress: To some extent  Relationships   Social connections:    Talks on phone: Three times a week  Gets together: Once a week    Attends religious service: 1 to 4 times per year    Active member of club or organization: Not on file    Attends meetings of clubs or organizations: Not on file    Relationship status: Married   Intimate partner violence:    Fear of current or ex partner: No    Emotionally abused: No    Physically abused: No    Forced sexual activity: No  Other Topics Concern   Not on file  Social History Narrative   Not on file    FAMILY HISTORY: Family History  Problem Relation Age of Onset   Alzheimer's disease Mother    Colon cancer Father    Breast cancer Neg Hx      ALLERGIES:  has No Known Allergies.  MEDICATIONS:  Current Outpatient Medications  Medication Sig Dispense Refill   albuterol (PROAIR HFA) 108 (90 Base) MCG/ACT inhaler Inhale 2 puffs into the lungs every 6 (six) hours as needed for wheezing or shortness of breath. 18 g 2   aspirin EC 81 MG tablet Take 81 mg by mouth daily.     atorvastatin (LIPITOR) 80 MG tablet Take 0.5 tablets (40 mg total) by mouth daily at 6 PM. (Patient taking differently: Take 20 mg by mouth 2 (two) times daily. ) 30 tablet 3   B Complex Vitamins (VITAMIN B COMPLEX PO) Take 1 tablet by mouth daily.      benzonatate (TESSALON PERLES) 100 MG capsule Take 2 capsules (200 mg total) by mouth 3 (three) times daily. (Patient not taking: Reported on 02/02/2019) 90 capsule 2   CALCIUM PO Take 1 tablet by mouth daily.      cefpodoxime (VANTIN) 200 MG tablet Take 1 tablet (200 mg total) by mouth every 12 (twelve) hours. (Patient not taking: Reported on 02/02/2019) 11 tablet 0   Cholecalciferol (VITAMIN D) 2000 units CAPS Take 1 capsule (2,000 Units total) by mouth daily. 30 capsule    dexamethasone (DECADRON) 4 MG tablet Take 8mg  daily for 2 days after chemtherapy treatments. (Patient not taking: Reported on 02/06/2019) 30 tablet 1   HYDROcodone-acetaminophen (NORCO/VICODIN) 5-325 MG tablet Take 1 tablet by mouth every 12 (twelve) hours as needed for moderate pain. 30 tablet 0   ipratropium-albuterol (DUONEB) 0.5-2.5 (3) MG/3ML SOLN Take 3 mLs by nebulization 3 (three) times daily. DX. J44.9 (Patient not taking: Reported on 02/06/2019) 360 mL 1   lidocaine-prilocaine (EMLA) cream Apply to affected area once 30 g 3   metoprolol tartrate (LOPRESSOR) 25 MG tablet Take 12.5 mg by mouth 2 (two) times daily.     nitroGLYCERIN (NITROSTAT) 0.4 MG SL tablet Place 1 tablet (0.4 mg total) under the tongue every 5 (five) minutes x 3 doses as needed for chest pain. 25 tablet 12   omeprazole (PRILOSEC) 40 MG capsule Take 1 capsule  (40 mg total) by mouth daily. 90 capsule 1   ondansetron (ZOFRAN) 8 MG tablet Take 1 tablet (8 mg total) by mouth 2 (two) times daily as needed for refractory nausea / vomiting. Start on day 3 after carboplatin chemo. 30 tablet 1   oxyCODONE (OXYCONTIN) 10 mg 12 hr tablet Take 1 tablet (10 mg total) by mouth every 12 (twelve) hours. 60 tablet 0   polyethylene glycol (MIRALAX / GLYCOLAX) packet Take 17 g by mouth daily as needed.      potassium chloride SA (K-DUR,KLOR-CON) 20 MEQ tablet TAKE 1 TABLET(20 MEQ) BY MOUTH DAILY 90 tablet 1  prochlorperazine (COMPAZINE) 10 MG tablet TAKE 1 TABLET BY MOUTH EVERY 6 HOURS AS NEEDED FOR NAUSEA OR VOMITING 337 tablet 1   sertraline (ZOLOFT) 50 MG tablet Take 1 tablet (50 mg total) by mouth daily. 30 tablet 1   sucralfate (CARAFATE) 1 g tablet TAKE 1 TABLET BY MOUTH FOUR TIMES DAILY, WITH MEALS& AT BEDTIME 368 tablet 0   vancomycin (VANCOCIN) 125 MG capsule Take 1 capsule (125 mg total) by mouth 4 (four) times daily. 40 capsule 0   No current facility-administered medications for this visit.    Facility-Administered Medications Ordered in Other Visits  Medication Dose Route Frequency Provider Last Rate Last Dose   heparin lock flush 100 unit/mL  500 Units Intravenous Once Earlie Server, MD       sodium chloride flush (NS) 0.9 % injection 10 mL  10 mL Intravenous PRN Earlie Server, MD   10 mL at 01/09/19 0820   sodium chloride flush (NS) 0.9 % injection 10 mL  10 mL Intravenous PRN Earlie Server, MD   10 mL at 01/10/19 1400     PHYSICAL EXAMINATION: ECOG PERFORMANCE STATUS: 1 - Symptomatic but completely ambulatory Vitals:   03/07/19 1051  BP: (!) 92/50  Pulse: 98  Resp: 18  Temp: 98 F (36.7 C)  SpO2: 98%   Filed Weights   Physical Exam  Constitutional: She is oriented to person, place, and time. No distress.  HENT:  Head: Normocephalic and atraumatic.  Nose: Nose normal.  Mouth/Throat: Oropharynx is clear and moist. No oropharyngeal exudate.   Eyes: Pupils are equal, round, and reactive to light. EOM are normal. No scleral icterus.  Neck: Normal range of motion. Neck supple.  Cardiovascular: Normal rate and regular rhythm.  No murmur heard. Pulmonary/Chest: Effort normal. No respiratory distress. She has no rales. She exhibits no tenderness.  Decreased breath sounds bilaterally.  Abdominal: Soft. She exhibits no distension. There is no abdominal tenderness.  Musculoskeletal: Normal range of motion.        General: No edema.  Neurological: She is alert and oriented to person, place, and time. No cranial nerve deficit. She exhibits normal muscle tone. Coordination normal.  Skin: Skin is warm and dry. She is not diaphoretic. No erythema.  Psychiatric: Affect normal.       LABORATORY DATA:  I have reviewed the data as listed Lab Results  Component Value Date   WBC 9.0 03/07/2019   HGB 13.4 03/07/2019   HCT 40.1 03/07/2019   MCV 95.2 03/07/2019   PLT 158 03/07/2019   Recent Labs    08/12/18 1438  01/30/19 0825 02/06/19 1255 03/07/19 1022  NA 131*   < > 136 135 132*  K 3.7   < > 3.8 4.0 3.3*  CL 97*   < > 106 99 97*  CO2 25   < > 24 27 22   GLUCOSE 122*   < > 99 101* 121*  BUN 9   < > 10 12 20   CREATININE 0.74   < > 0.53 0.64 1.23*  CALCIUM 9.1   < > 9.0 9.0 9.1  GFRNONAA >60   < > >60 >60 44*  GFRAA >60   < > >60 >60 51*  PROT 8.0   < > 6.8 7.2 8.1  ALBUMIN 4.3   < > 3.5 3.6 4.2  AST 33   < > 17 18 34  ALT 26   < > 13 15 30   ALKPHOS 81   < > 86 99 159*  BILITOT 0.6   < > 0.4 0.4 0.8  BILIDIR 0.2  --   --   --   --   IBILI 0.4  --   --   --   --    < > = values in this interval not displayed.    RADIOGRAPHIC STUDIES: I have personally reviewed the radiological images as listed and agreed with the findings in the report. 10/20/2018 CT chest w contrast . Large mediastinal mass involving both hila left much greater than right consistent with lung carcinoma possibly small cell lung carcinoma. This mass causes  narrowing of the left mainstem bronchus with resultant volume loss on the left and mediastinal shift to the Left. 2. Moderate size left pleural effusion. 3. Coronary artery calcifications and moderate thoracic aortic atherosclerosis. PET 10/26/2018 . Large left lung mass is markedly hypermetabolic in the confluent lymphadenopathy in the mediastinum and in bilateral hilar regions also shows marked hypermetabolism. 2. Hypermetabolic metastatic lymphadenopathy in the right neck and supraclavicular region. 3. Hypermetabolic lymphadenopathy in the hepato duodenal ligament consistent with metastatic disease. 4. Large hypermetabolic lesion posterior right acetabulum without correlate of CT finding. Features highly suspicious for bony metastatic disease. MRI brain 10/28/2018 1. No metastatic disease or acute intracranial abnormality identified. 2. 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  3. Moderately advanced but nonspecific cerebral white matter signal changes, most commonly due to chronic small vessel disease. Lesser signal changes in the deep gray matter and pons may also be small vessel related.  CT chest abdomen pelvis with contrast 12/15/2018 1 Marked response to therapy of thoracic adenopathy. 2. No new or progressive disease in the chest. 3. Coronary artery atherosclerosis. 4. 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 5. No new or progressive disease in the abdomen or pelvis 6. Pelvic floor laxity, cystocele with bladder wall thickening, suggesting a component of outlet obstruction. 7. Possible vague sclerosis within the posterior right acetabulum, in the region of hypermetabolism on prior PET. Likely an area of treated metastasis. 8. Central line tip in right brachiocephalic vein with surrounding occlusive thrombus. CT chest abdomen pelvis with contrast 01/24/2019 1. New extensive gas throughout the bladder wall and within the bladder lumen with underlying mild diffuse bladder  wall thickening, compatible with acute emphysematous cystitis. 2. Two new subcentimeter low-attenuation lesions in the right kidney, nonspecific but worrisome for mild acute right pyelonephritis given the above bladder findings. No discrete/drainable renal abscess. No hydronephrosis. 3. Continued positive response to therapy with continued reduction in mediastinal adenopathy. No residual measurable left lung mass.Stable faint sclerosis at the site of the hypermetabolic right acetabular osseous lesion. No new or progressive metastatic disease in the chest, abdomen or pelvis. 4. Small bowel containing small periumbilical hernia without bowel complication at this time. 5. Aortic Atherosclerosis (ICD10-I70.0) and Emphysema (ICD10-J43.9).  ASSESSMENT & PLAN:  1. Small cell lung cancer ([place])   2. Diarrhea, unspecified type   3. Non-intractable vomiting with nausea, unspecified vomiting type   4. Hypotension due to hypovolemia   5. Other chest pain   6. AKI (acute kidney injury) ([place])   7. Radiation-induced esophagitis    #Extensive small cell lung cancer S/p 4 cycles of Carboplatin, Etoposide and Tecentriq.  Currently undergoing chest radiation.  #Hypotension /AKI likely secondary to hypovolemia due to Nausea/vomiting,  Proceed with 1 L of IV fluid, along with Zofran 8 mg IV x1. We will schedule patient to proceed with IV fluids session daily.  Repeat BMP in 2 days.  #Hypokalemia,  will give 20 mEq potassium chloride IV x1. #Diarrhea, recent history of C. difficile colitis.  Will recheck C. Difficile .  If negative, okay to use Imodium as instructed.  #Patient was getting IV fluid and IV potassium when she experienced transient middle chest pain, sharp in nature," stabbing", spontaneously resolved.  Nonradiating.  Not associated with shortness of breath, nausea, eating or drinking.  She has a history of CAD and MI and she feels chest pain is atypical and not similar to the chest pain she  experienced when she had heart attack. We stopped IV potassium. EKG was obtained which were reviewed by me independently.  T wave inversions more prominent in anterior leads. Troponins was obtained which came back negative. I discussed with patient about going to emergency room for further testing and evaluation of chest pain.  Patient is reluctant. I called and discussed with patient's cardiologist Dr.Harwani and faxed patient's EKG over to him. Based on the atypical feature of the chest pain, potential esophageal spasm/esophagitis secondary to mid chest radiation, negative troponin, EKG changes, Dr. Terrence Dupont feels patient is having atypical chest pain.  Patient has had anterior lead T wave inversions in the past which appears to be slightly more prominent today.  Cardiology is okay with patient's going home and have outpatient follow-up with cardiology.  Is okay with not trending any further troponins today. Discussed with patient and urged patient to make an outpatient follow-up appointment with Dr. Terrence Dupont.  Also advised patient to seek immediate medical advice if chest pain happens again or symptoms get worse.  #Radiation-induced esophagitis, continue omeprazole and sucralfate.     The patient knows to call the clinic with any problems questions or concerns. We spent sufficient time to discuss many aspect of care, questions were answered to patient's satisfaction. Total face to face encounter time for this patient visit was 40 min. >50% of the time was  spent in counseling and coordination of care.  Return visit 1 week.    Earlie Server, MD, PhD Hematology Oncology [facility] Pager- [phone] 03/07/2019

## 2019-03-08 ENCOUNTER — Inpatient Hospital Stay: Payer: Medicare Other

## 2019-03-08 ENCOUNTER — Ambulatory Visit
Admission: RE | Admit: 2019-03-08 | Discharge: 2019-03-08 | Disposition: A | Discharge status: Home / Self Care | Payer: Medicare Other | Source: Ambulatory Visit | Attending: Radiation Oncology | Admitting: Radiation Oncology

## 2019-03-08 ENCOUNTER — Other Ambulatory Visit: Payer: Self-pay | Admitting: *Deleted

## 2019-03-08 ENCOUNTER — Other Ambulatory Visit: Payer: Self-pay

## 2019-03-08 DIAGNOSIS — F17210 Nicotine dependence, cigarettes, uncomplicated: Secondary | ICD-10-CM | POA: Diagnosis not present

## 2019-03-08 DIAGNOSIS — I252 Old myocardial infarction: Secondary | ICD-10-CM | POA: Diagnosis not present

## 2019-03-08 DIAGNOSIS — C773 Secondary and unspecified malignant neoplasm of axilla and upper limb lymph nodes: Secondary | ICD-10-CM | POA: Diagnosis not present

## 2019-03-08 DIAGNOSIS — Z510 Encounter for antineoplastic radiation therapy: Secondary | ICD-10-CM | POA: Diagnosis not present

## 2019-03-08 DIAGNOSIS — N179 Acute kidney failure, unspecified: Secondary | ICD-10-CM | POA: Diagnosis not present

## 2019-03-08 DIAGNOSIS — E876 Hypokalemia: Secondary | ICD-10-CM | POA: Diagnosis not present

## 2019-03-08 DIAGNOSIS — E860 Dehydration: Secondary | ICD-10-CM

## 2019-03-08 DIAGNOSIS — C3402 Malignant neoplasm of left main bronchus: Secondary | ICD-10-CM | POA: Diagnosis not present

## 2019-03-08 DIAGNOSIS — R0789 Other chest pain: Secondary | ICD-10-CM | POA: Diagnosis not present

## 2019-03-08 DIAGNOSIS — C3492 Malignant neoplasm of unspecified part of left bronchus or lung: Secondary | ICD-10-CM | POA: Diagnosis not present

## 2019-03-08 DIAGNOSIS — R197 Diarrhea, unspecified: Secondary | ICD-10-CM

## 2019-03-08 DIAGNOSIS — C781 Secondary malignant neoplasm of mediastinum: Secondary | ICD-10-CM | POA: Diagnosis not present

## 2019-03-08 DIAGNOSIS — I2510 Atherosclerotic heart disease of native coronary artery without angina pectoris: Secondary | ICD-10-CM | POA: Diagnosis not present

## 2019-03-08 LAB — C DIFFICILE QUICK SCREEN W PCR REFLEX
C Diff antigen: POSITIVE — AB
C Diff toxin: NEGATIVE

## 2019-03-08 LAB — CLOSTRIDIUM DIFFICILE BY PCR, REFLEXED: CDIFFPCR: POSITIVE — AB

## 2019-03-08 MED ORDER — SUCRALFATE 1 G PO TABS: 1.0000 g | ORAL_TABLET | Freq: Three times a day (TID) | ORAL | 3 refills | Status: DC

## 2019-03-08 MED ORDER — SODIUM CHLORIDE 0.9% FLUSH
10.0000 mL | INTRAVENOUS | Status: DC | PRN
  Administered 2019-03-08: 10 mL via INTRAVENOUS
  Filled 2019-03-08: qty 10

## 2019-03-08 MED ORDER — HEPARIN SOD (PORK) LOCK FLUSH 100 UNIT/ML IV SOLN
500.0000 [IU] | Freq: Once | INTRAVENOUS | Status: AC | PRN
  Administered 2019-03-08: 500 [IU]

## 2019-03-08 MED ORDER — SODIUM CHLORIDE 0.9 % IV SOLN
Freq: Once | INTRAVENOUS | Status: AC
  Administered 2019-03-08: 10:00:00 via INTRAVENOUS
  Filled 2019-03-08: qty 250

## 2019-03-09 ENCOUNTER — Inpatient Hospital Stay: Payer: Medicare Other

## 2019-03-09 ENCOUNTER — Other Ambulatory Visit: Payer: Self-pay

## 2019-03-09 ENCOUNTER — Ambulatory Visit
Admission: RE | Admit: 2019-03-09 | Discharge: 2019-03-09 | Disposition: A | Discharge status: Home / Self Care | Payer: Medicare Other | Source: Ambulatory Visit | Attending: Radiation Oncology | Admitting: Radiation Oncology

## 2019-03-09 DIAGNOSIS — C3402 Malignant neoplasm of left main bronchus: Secondary | ICD-10-CM | POA: Diagnosis not present

## 2019-03-09 DIAGNOSIS — E876 Hypokalemia: Secondary | ICD-10-CM | POA: Diagnosis not present

## 2019-03-09 DIAGNOSIS — R0789 Other chest pain: Secondary | ICD-10-CM | POA: Diagnosis not present

## 2019-03-09 DIAGNOSIS — I252 Old myocardial infarction: Secondary | ICD-10-CM | POA: Diagnosis not present

## 2019-03-09 DIAGNOSIS — C781 Secondary malignant neoplasm of mediastinum: Secondary | ICD-10-CM | POA: Diagnosis not present

## 2019-03-09 DIAGNOSIS — N179 Acute kidney failure, unspecified: Secondary | ICD-10-CM | POA: Diagnosis not present

## 2019-03-09 DIAGNOSIS — I2510 Atherosclerotic heart disease of native coronary artery without angina pectoris: Secondary | ICD-10-CM | POA: Diagnosis not present

## 2019-03-09 DIAGNOSIS — R5383 Other fatigue: Secondary | ICD-10-CM

## 2019-03-09 DIAGNOSIS — Z510 Encounter for antineoplastic radiation therapy: Secondary | ICD-10-CM | POA: Diagnosis not present

## 2019-03-09 LAB — COMPREHENSIVE METABOLIC PANEL
ALT: 29 U/L (ref 0–44)
AST: 30 U/L (ref 15–41)
Albumin: 3.6 g/dL (ref 3.5–5.0)
Alkaline Phosphatase: 133 U/L — ABNORMAL HIGH (ref 38–126)
Anion gap: 7 (ref 5–15)
BUN: 11 mg/dL (ref 8–23)
CO2: 23 mmol/L (ref 22–32)
Calcium: 8.7 mg/dL — ABNORMAL LOW (ref 8.9–10.3)
Chloride: 104 mmol/L (ref 98–111)
Creatinine, Ser: 0.66 mg/dL (ref 0.44–1.00)
GFR calc Af Amer: >60 mL/min (ref >60)
GFR calc non Af Amer: >60 mL/min (ref >60)
Glucose, Bld: 100 mg/dL — ABNORMAL HIGH (ref 70–99)
Potassium: 3.7 mmol/L (ref 3.5–5.1)
Sodium: 134 mmol/L — ABNORMAL LOW (ref 135–145)
TOTAL PROTEIN: 7 g/dL (ref 6.5–8.1)
Total Bilirubin: 0.6 mg/dL (ref 0.3–1.2)

## 2019-03-09 LAB — CBC WITH DIFFERENTIAL/PLATELET
Abs Immature Granulocytes: 0.04 10*3/uL (ref 0.00–0.07)
BASOS ABS: 0 10*3/uL (ref 0.0–0.1)
BASOS PCT: 0 %
Eosinophils Absolute: 0.2 10*3/uL (ref 0.0–0.5)
Eosinophils Relative: 3 %
HCT: 33.2 % — ABNORMAL LOW (ref 36.0–46.0)
Hemoglobin: 11.1 g/dL — ABNORMAL LOW (ref 12.0–15.0)
Immature Granulocytes: 1 %
Lymphocytes Relative: 15 %
Lymphs Abs: 1.1 10*3/uL (ref 0.7–4.0)
MCH: 31.8 pg (ref 26.0–34.0)
MCHC: 33.4 g/dL (ref 30.0–36.0)
MCV: 95.1 fL (ref 80.0–100.0)
Monocytes Absolute: 0.9 10*3/uL (ref 0.1–1.0)
Monocytes Relative: 12 %
Neutro Abs: 5.2 10*3/uL (ref 1.7–7.7)
Neutrophils Relative %: 69 %
PLATELETS: 130 10*3/uL — AB (ref 150–400)
RBC: 3.49 MIL/uL — ABNORMAL LOW (ref 3.87–5.11)
RDW: 12.4 % (ref 11.5–15.5)
WBC: 7.5 10*3/uL (ref 4.0–10.5)
nRBC: 0 % (ref 0.0–0.2)

## 2019-03-09 LAB — TSH: TSH: 0.63 u[IU]/mL (ref 0.350–4.500)

## 2019-03-10 ENCOUNTER — Other Ambulatory Visit: Payer: Self-pay

## 2019-03-10 ENCOUNTER — Ambulatory Visit
Admission: RE | Admit: 2019-03-10 | Discharge: 2019-03-10 | Disposition: A | Discharge status: Home / Self Care | Payer: Medicare Other | Source: Ambulatory Visit | Attending: Radiation Oncology | Admitting: Radiation Oncology

## 2019-03-10 DIAGNOSIS — C3492 Malignant neoplasm of unspecified part of left bronchus or lung: Secondary | ICD-10-CM | POA: Diagnosis not present

## 2019-03-10 DIAGNOSIS — C773 Secondary and unspecified malignant neoplasm of axilla and upper limb lymph nodes: Secondary | ICD-10-CM | POA: Diagnosis not present

## 2019-03-10 DIAGNOSIS — C781 Secondary malignant neoplasm of mediastinum: Secondary | ICD-10-CM | POA: Diagnosis not present

## 2019-03-10 DIAGNOSIS — F17210 Nicotine dependence, cigarettes, uncomplicated: Secondary | ICD-10-CM | POA: Diagnosis not present

## 2019-03-10 DIAGNOSIS — Z510 Encounter for antineoplastic radiation therapy: Secondary | ICD-10-CM | POA: Diagnosis not present

## 2019-03-13 ENCOUNTER — Other Ambulatory Visit: Payer: Self-pay

## 2019-03-13 ENCOUNTER — Ambulatory Visit
Admission: RE | Admit: 2019-03-13 | Discharge: 2019-03-13 | Disposition: A | Discharge status: Home / Self Care | Payer: Medicare Other | Source: Ambulatory Visit | Attending: Radiation Oncology | Admitting: Radiation Oncology

## 2019-03-13 DIAGNOSIS — C3492 Malignant neoplasm of unspecified part of left bronchus or lung: Secondary | ICD-10-CM | POA: Diagnosis not present

## 2019-03-13 DIAGNOSIS — C781 Secondary malignant neoplasm of mediastinum: Secondary | ICD-10-CM | POA: Diagnosis not present

## 2019-03-13 DIAGNOSIS — Z510 Encounter for antineoplastic radiation therapy: Secondary | ICD-10-CM | POA: Diagnosis not present

## 2019-03-13 DIAGNOSIS — C773 Secondary and unspecified malignant neoplasm of axilla and upper limb lymph nodes: Secondary | ICD-10-CM | POA: Diagnosis not present

## 2019-03-13 DIAGNOSIS — F17210 Nicotine dependence, cigarettes, uncomplicated: Secondary | ICD-10-CM | POA: Diagnosis not present

## 2019-03-14 ENCOUNTER — Encounter: Payer: Self-pay | Admitting: Oncology

## 2019-03-14 ENCOUNTER — Other Ambulatory Visit: Payer: Self-pay

## 2019-03-14 ENCOUNTER — Inpatient Hospital Stay: Payer: Medicare Other

## 2019-03-14 ENCOUNTER — Ambulatory Visit
Admission: RE | Admit: 2019-03-14 | Discharge: 2019-03-14 | Disposition: A | Discharge status: Home / Self Care | Payer: Medicare Other | Source: Ambulatory Visit | Attending: Radiation Oncology | Admitting: Radiation Oncology

## 2019-03-14 ENCOUNTER — Inpatient Hospital Stay: Payer: Medicare Other | Admitting: Oncology

## 2019-03-14 ENCOUNTER — Inpatient Hospital Stay (HOSPITAL_BASED_OUTPATIENT_CLINIC_OR_DEPARTMENT_OTHER): Payer: Medicare Other | Admitting: Oncology

## 2019-03-14 VITALS — BP 98/64 | HR 76 | Temp 97.9°F | Resp 18

## 2019-03-14 DIAGNOSIS — C3402 Malignant neoplasm of left main bronchus: Secondary | ICD-10-CM | POA: Diagnosis not present

## 2019-03-14 DIAGNOSIS — F17210 Nicotine dependence, cigarettes, uncomplicated: Secondary | ICD-10-CM | POA: Diagnosis not present

## 2019-03-14 DIAGNOSIS — Z87891 Personal history of nicotine dependence: Secondary | ICD-10-CM

## 2019-03-14 DIAGNOSIS — R208 Other disturbances of skin sensation: Secondary | ICD-10-CM

## 2019-03-14 DIAGNOSIS — K208 Other esophagitis without bleeding: Secondary | ICD-10-CM

## 2019-03-14 DIAGNOSIS — I959 Hypotension, unspecified: Secondary | ICD-10-CM

## 2019-03-14 DIAGNOSIS — Z510 Encounter for antineoplastic radiation therapy: Secondary | ICD-10-CM | POA: Diagnosis not present

## 2019-03-14 DIAGNOSIS — C773 Secondary and unspecified malignant neoplasm of axilla and upper limb lymph nodes: Secondary | ICD-10-CM | POA: Diagnosis not present

## 2019-03-14 DIAGNOSIS — R5383 Other fatigue: Secondary | ICD-10-CM

## 2019-03-14 DIAGNOSIS — G893 Neoplasm related pain (acute) (chronic): Secondary | ICD-10-CM

## 2019-03-14 DIAGNOSIS — C781 Secondary malignant neoplasm of mediastinum: Secondary | ICD-10-CM | POA: Diagnosis not present

## 2019-03-14 DIAGNOSIS — R197 Diarrhea, unspecified: Secondary | ICD-10-CM

## 2019-03-14 DIAGNOSIS — R110 Nausea: Secondary | ICD-10-CM | POA: Diagnosis not present

## 2019-03-14 DIAGNOSIS — C3490 Malignant neoplasm of unspecified part of unspecified bronchus or lung: Secondary | ICD-10-CM

## 2019-03-14 DIAGNOSIS — E876 Hypokalemia: Secondary | ICD-10-CM | POA: Diagnosis not present

## 2019-03-14 DIAGNOSIS — N179 Acute kidney failure, unspecified: Secondary | ICD-10-CM | POA: Diagnosis not present

## 2019-03-14 DIAGNOSIS — R1310 Dysphagia, unspecified: Secondary | ICD-10-CM | POA: Diagnosis not present

## 2019-03-14 DIAGNOSIS — R5382 Chronic fatigue, unspecified: Secondary | ICD-10-CM

## 2019-03-14 DIAGNOSIS — I2510 Atherosclerotic heart disease of native coronary artery without angina pectoris: Secondary | ICD-10-CM | POA: Diagnosis not present

## 2019-03-14 DIAGNOSIS — C3492 Malignant neoplasm of unspecified part of left bronchus or lung: Secondary | ICD-10-CM | POA: Diagnosis not present

## 2019-03-14 DIAGNOSIS — R079 Chest pain, unspecified: Secondary | ICD-10-CM | POA: Diagnosis not present

## 2019-03-14 DIAGNOSIS — R0789 Other chest pain: Secondary | ICD-10-CM | POA: Diagnosis not present

## 2019-03-14 DIAGNOSIS — I252 Old myocardial infarction: Secondary | ICD-10-CM | POA: Diagnosis not present

## 2019-03-14 LAB — COMPREHENSIVE METABOLIC PANEL
ALT: 30 U/L (ref 0–44)
AST: 30 U/L (ref 15–41)
Albumin: 3.6 g/dL (ref 3.5–5.0)
Alkaline Phosphatase: 113 U/L (ref 38–126)
Anion gap: 7 (ref 5–15)
BUN: 14 mg/dL (ref 8–23)
CO2: 26 mmol/L (ref 22–32)
Calcium: 8.8 mg/dL — ABNORMAL LOW (ref 8.9–10.3)
Chloride: 105 mmol/L (ref 98–111)
Creatinine, Ser: 0.73 mg/dL (ref 0.44–1.00)
GFR calc non Af Amer: >60 mL/min (ref >60)
Glucose, Bld: 93 mg/dL (ref 70–99)
Potassium: 3.5 mmol/L (ref 3.5–5.1)
Sodium: 138 mmol/L (ref 135–145)
Total Bilirubin: 0.4 mg/dL (ref 0.3–1.2)
Total Protein: 6.9 g/dL (ref 6.5–8.1)

## 2019-03-14 LAB — MAGNESIUM: Magnesium: 1.5 mg/dL — ABNORMAL LOW (ref 1.7–2.4)

## 2019-03-14 MED ORDER — MAGNESIUM CHLORIDE 64 MG PO TBEC: 1.0000 | DELAYED_RELEASE_TABLET | Freq: Every day | ORAL | 0 refills | Status: DC

## 2019-03-14 MED ORDER — MORPHINE SULFATE 20 MG/5ML PO SOLN: 10.0000 mg | ORAL | 0 refills | Status: DC | PRN

## 2019-03-14 NOTE — Progress Notes (Signed)
Hematology/Oncology Follow up note [facility] [phone] [phone]   Patient Care Team: Glean Hess, MD as PCP - General (Internal Medicine) Charolette Forward, MD as Consulting Physician (Cardiology) Telford Nab, RN as Registered Nurse  REFERRING PROVIDER: Dr. 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 Patient recently was referred to see pulmonology Dr. Felicie Morn for persistent cough and dyspnea.  Patient reports coughing up clear sputum for about 2 months.  No hemoptysis.  She also had weight loss. Former smoker, quitting smoking 2 weeks ago.  39-pack-year smoking history  09/30/2018 chest x-ray showed new market volume loss in the left chest area of the left mainstem bronchus worrisome for centrally obstructing mass. 10/17/2018 chest x-ray showed volume loss worsened in the left hemithorax worsening patchy consolidation in the mid to lower left lung with minimal residual aeration and up in the left lung mass not excluded.  Possible small left pleural effusion. 10/31 2019 CT chest with contrast showed large mediastinal mass involving both hilar, left greater than right, consistent with lung carcinoma The mass causes narrowing of the left mainstem bronchus with resultant volume loss on the left and a mediastinal shift to the left.  Moderate size left pleural effusion. 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  Today patient was accompanied by husband and daughter to clinic to discuss diagnosis and management plan. She reports feeling tired feels shortness of breath with exertion.  Persistent coughing, productive with clear sputum. Also  reports difficulty swallowing, feels food sticking in her food pipe. MRI brain was obtained which was negative for intracranial metastatic disease.   01/24/2019 interim CT scan done which showed continued positive response to therapy with continued reduction in mediastinal adenopathy.  No residual measurable left lung mass. However she developed new extensive gas throughout the bladder wall and within the bladder lumen with underlying mild diffuse bladder wall thickening.  Concerning for acute emphysematous cystitis.  There is also to new subcentimeter low-attenuation lesion in the right kidney.  Nonspecific but worrisome for right pyelonephritis. I instructed patient to go to emergency room for further evaluation.  Patient was admitted for treatment for acute cystitis.  Urology was consulted and evaluate patient during her hospitalization.  Urology did not feel that patient has pyelonephritis and recommend treatment with antibiotics because patient's immunocompromise.  Patient was on IV cephapirin and then transitioned to p.o.cefpodoxime at discharge.  INTERVAL HISTORY Kristi [name] is a 73 y.o. female who has above history reviewed by me today presents for acute visit for evaluation of hypotension and nausea. Status post 4 cycles of carbo,  etoposide, Tecentriq. Patient is currently undergoing palliative radiation to chest.  She may be starting on whole brain radiation after she finishes palliative chest radiation. Reports nausea and vomiting has resolved.  No additional chest pain episodes. She has dysphagia symptoms, at the level of lower neck, sharp in nature, always exacerbated when she eats food. Diarrhea has stopped. Feels better today. Denies any fever, chills, abdominal pain, chest pain, lower extremity swelling. Chronic fatigue at baseline.  Review of Systems  Constitutional: Positive for fatigue. Negative for appetite change, chills and fever.  HENT:   Negative for hearing loss and  voice change.  Eyes: Negative for eye problems.  Respiratory: Negative for chest tightness, cough and shortness of breath.   Cardiovascular: Negative for chest pain.  Gastrointestinal: Negative for abdominal distention, abdominal pain, blood in stool, diarrhea and nausea.       Heartburn, pain with swallowing.   Endocrine: Negative for hot flashes.  Genitourinary: Negative for difficulty urinating and frequency.   Musculoskeletal: Negative for arthralgias.  Skin: Negative for itching and rash.  Neurological: Negative for extremity weakness.  Hematological: Negative for adenopathy.  Psychiatric/Behavioral: Negative for confusion.     MEDICAL HISTORY:  Past Medical History:  Diagnosis Date  . Acute MI, inferoposterior wall ([place]) 09/30/2014  . Claustrophobia   . Coronary artery disease   . Hypercholesteremia   . Hypertension   . MI, old   . 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 [facility]) 10/27/2018    SURGICAL HISTORY: Past Surgical History:  Procedure Laterality Date  . APPENDECTOMY     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  . BLADDER NECK SUSPENSION    . CHOLECYSTECTOMY N/A 08/14/2018   Procedure: LAPAROSCOPIC CHOLECYSTECTOMY;  Surgeon: Jules Husbands, MD;  Location: [facility] ORS;  Service: General;  Laterality: N/A;  . CHOLECYSTECTOMY  11/2018  . CORONARY STENT PLACEMENT    . ENDOBRONCHIAL ULTRASOUND N/A 10/21/2018   Procedure: ENDOBRONCHIAL ULTRASOUND;  Surgeon: Laverle Hobby, MD;  Location: [facility] ORS;  Service: Pulmonary;  Laterality: N/A;  . HERNIA REPAIR    . IR FLUORO GUIDE CV LINE RIGHT  12/19/2018  . LEFT HEART CATHETERIZATION WITH CORONARY ANGIOGRAM N/A 09/29/2014   Procedure: LEFT HEART CATHETERIZATION WITH CORONARY ANGIOGRAM;  Surgeon: Clent Demark, MD;  Location: [facility] CATH LAB;  Service: Cardiovascular;  Laterality: N/A;  . PORTA CATH INSERTION N/A 01/02/2019   Procedure: PORTA CATH INSERTION;  Surgeon: Algernon Huxley, MD;  Location: [place] CV LAB;  Service: Cardiovascular;   Laterality: N/A;  . PORTACATH PLACEMENT Right 10/28/2018   Procedure: INSERTION PORT-A-CATH;  Surgeon: Jules Husbands, MD;  Location: [facility] ORS;  Service: General;  Laterality: Right;    SOCIAL HISTORY: Social History   Socioeconomic History  . Marital status: Married    Spouse name: [name]   . Number of children: 3  . Years of education: Not on file  . Highest education level: Not on file  Occupational History  . Occupation: Retired    Comment: Chief Technology Officer   Social Needs  . Financial resource strain: Not very hard  . Food insecurity:    Worry: Not on file    Inability: Not on file  . Transportation needs:    Medical: No    Non-medical: No  Tobacco Use  . Smoking status: Former Smoker    Packs/day: 1.00    Years: 39.00    Pack years: 39.00    Types: Cigarettes    Start date: 12/21/1978    Last attempt to quit: 10/16/2018    Years since quitting: 0.4  . Smokeless tobacco: Never Used  Substance and Sexual Activity  . Alcohol use: Yes    Alcohol/week: 0.0 standard drinks    Comment: occassional - approx 1 every 2 weeks   . Drug use: No  . Sexual activity: Yes    Birth control/protection: None  Lifestyle  . Physical activity:    Days per week: 0 days    Minutes per session: Not on file  . Stress: To some extent  Relationships  . Social connections:    Talks on phone: Three times a week  Gets together: Once a week    Attends religious service: 1 to 4 times per year    Active member of club or organization: Not on file    Attends meetings of clubs or organizations: Not on file    Relationship status: Married  . Intimate partner violence:    Fear of current or ex partner: No    Emotionally abused: No    Physically abused: No    Forced sexual activity: No  Other Topics Concern  . Not on file  Social History Narrative  . Not on file    FAMILY HISTORY: Family History  Problem Relation Age of Onset  . Alzheimer's disease Mother   . Colon cancer Father   .  Breast cancer Neg Hx     ALLERGIES:  has No Known Allergies.  MEDICATIONS:  Current Outpatient Medications  Medication Sig Dispense Refill  . albuterol (PROAIR HFA) 108 (90 Base) MCG/ACT inhaler Inhale 2 puffs into the lungs every 6 (six) hours as needed for wheezing or shortness of breath. 18 g 2  . atorvastatin (LIPITOR) 80 MG tablet Take 0.5 tablets (40 mg total) by mouth daily at 6 PM. (Patient taking differently: Take 20 mg by mouth 2 (two) times daily. ) 30 tablet 3  . B Complex Vitamins (VITAMIN B COMPLEX PO) Take 1 tablet by mouth daily.     [name] Kitchen CALCIUM PO Take 1 tablet by mouth daily.     . Cholecalciferol (VITAMIN D) 2000 units CAPS Take 1 capsule (2,000 Units total) by mouth daily. 30 capsule   . HYDROcodone-acetaminophen (NORCO/VICODIN) 5-325 MG tablet Take 1 tablet by mouth every 12 (twelve) hours as needed for moderate pain. 30 tablet 0  . lidocaine-prilocaine (EMLA) cream Apply to affected area once 30 g 3  . metoprolol tartrate (LOPRESSOR) 25 MG tablet Take 12.5 mg by mouth 2 (two) times daily.    [name] Kitchen omeprazole (PRILOSEC) 40 MG capsule Take 1 capsule (40 mg total) by mouth daily. 90 capsule 1  . ondansetron (ZOFRAN) 8 MG tablet Take 1 tablet (8 mg total) by mouth 2 (two) times daily as needed for refractory nausea / vomiting. Start on day 3 after carboplatin chemo. 30 tablet 1  . oxyCODONE (OXYCONTIN) 10 mg 12 hr tablet Take 1 tablet (10 mg total) by mouth every 12 (twelve) hours. 60 tablet 0  . potassium chloride SA (K-DUR,KLOR-CON) 20 MEQ tablet TAKE 1 TABLET(20 MEQ) BY MOUTH DAILY 90 tablet 1  . prochlorperazine (COMPAZINE) 10 MG tablet TAKE 1 TABLET BY MOUTH EVERY 6 HOURS AS NEEDED FOR NAUSEA OR VOMITING 337 tablet 1  . sertraline (ZOLOFT) 50 MG tablet Take 1 tablet (50 mg total) by mouth daily. 30 tablet 1  . sucralfate (CARAFATE) 1 g tablet Take 1 tablet (1 g total) by mouth 3 (three) times daily. Dissolve in 3-4 tbsp warm water, swish and swallow. 90 tablet 3  . aspirin EC  81 MG tablet Take 81 mg by mouth daily.    . benzonatate (TESSALON PERLES) 100 MG capsule Take 2 capsules (200 mg total) by mouth 3 (three) times daily. (Patient not taking: Reported on 02/02/2019) 90 capsule 2  . dexamethasone (DECADRON) 4 MG tablet Take 8mg  daily for 2 days after chemtherapy treatments. (Patient not taking: Reported on 02/06/2019) 30 tablet 1  . ipratropium-albuterol (DUONEB) 0.5-2.5 (3) MG/3ML SOLN Take 3 mLs by nebulization 3 (three) times daily. DX. J44.9 (Patient not taking: Reported on 02/06/2019) 360 mL 1  . magnesium chloride (SLOW-MAG)  64 MG TBEC SR tablet Take 1 tablet (64 mg total) by mouth daily. 60 tablet 0  . morphine 20 MG/5ML solution Take 2.5 mLs (10 mg total) by mouth every 4 (four) hours as needed for pain. 100 mL 0  . nitroGLYCERIN (NITROSTAT) 0.4 MG SL tablet Place 1 tablet (0.4 mg total) under the tongue every 5 (five) minutes x 3 doses as needed for chest pain. (Patient not taking: Reported on 03/14/2019) 25 tablet 12  . polyethylene glycol (MIRALAX / GLYCOLAX) packet Take 17 g by mouth daily as needed.      No current facility-administered medications for this visit.    Facility-Administered Medications Ordered in Other Visits  Medication Dose Route Frequency Provider Last Rate Last Dose  . heparin lock flush 100 unit/mL  500 Units Intravenous Once Earlie Server, MD      . sodium chloride flush (NS) 0.9 % injection 10 mL  10 mL Intravenous PRN Earlie Server, MD   10 mL at 01/09/19 0820  . sodium chloride flush (NS) 0.9 % injection 10 mL  10 mL Intravenous PRN Earlie Server, MD   10 mL at 01/10/19 1400     PHYSICAL EXAMINATION: ECOG PERFORMANCE STATUS: 1 - Symptomatic but completely ambulatory Vitals:   03/14/19 0922  BP: 98/64  Pulse: 76  Resp: 18  Temp: 97.9 F (36.6 C)  SpO2: 98%   There were no vitals filed for this visit. Physical Exam  Constitutional: She is oriented to person, place, and time. No distress.  HENT:  Head: Normocephalic and atraumatic.   Nose: Nose normal.  Mouth/Throat: Oropharynx is clear and moist. No oropharyngeal exudate.  Eyes: Pupils are equal, round, and reactive to light. EOM are normal. No scleral icterus.  Neck: Normal range of motion. Neck supple.  Cardiovascular: Normal rate and regular rhythm.  No murmur heard. Pulmonary/Chest: Effort normal. No respiratory distress. She has no rales. She exhibits no tenderness.  Decreased breath sounds bilaterally.  Abdominal: Soft. She exhibits no distension. There is no abdominal tenderness.  Musculoskeletal: Normal range of motion.        General: No edema.  Neurological: She is alert and oriented to person, place, and time. No cranial nerve deficit. She exhibits normal muscle tone. Coordination normal.  Skin: Skin is warm and dry. She is not diaphoretic. No erythema.  Psychiatric: Affect normal.       LABORATORY DATA:  I have reviewed the data as listed Lab Results  Component Value Date   WBC 7.5 03/09/2019   HGB 11.1 (L) 03/09/2019   HCT 33.2 (L) 03/09/2019   MCV 95.1 03/09/2019   PLT 130 (L) 03/09/2019   Recent Labs    08/12/18 1438  03/07/19 1022 03/09/19 1236 03/14/19 0916  NA 131*   < > 132* 134* 138  K 3.7   < > 3.3* 3.7 3.5  CL 97*   < > 97* 104 105  CO2 25   < > 22 23 26   GLUCOSE 122*   < > 121* 100* 93  BUN 9   < > 20 11 14   CREATININE 0.74   < > 1.23* 0.66 0.73  CALCIUM 9.1   < > 9.1 8.7* 8.8*  GFRNONAA >60   < > 44* >60 >60  GFRAA >60   < > 51* >60 >60  PROT 8.0   < > 8.1 7.0 6.9  ALBUMIN 4.3   < > 4.2 3.6 3.6  AST 33   < > 34 30 30  ALT 26   < > 30 29 30   ALKPHOS 81   < > 159* 133* 113  BILITOT 0.6   < > 0.8 0.6 0.4  BILIDIR 0.2  --   --   --   --   IBILI 0.4  --   --   --   --    < > = values in this interval not displayed.    RADIOGRAPHIC STUDIES: I have personally reviewed the radiological images as listed and agreed with the findings in the report. 10/20/2018 CT chest w contrast . Large mediastinal mass involving both hila  left much greater than right consistent with lung carcinoma possibly small cell lung carcinoma. This mass causes narrowing of the left mainstem bronchus with resultant volume loss on the left and mediastinal shift to the Left. 2. Moderate size left pleural effusion. 3. Coronary artery calcifications and moderate thoracic aortic atherosclerosis. PET 10/26/2018 . Large left lung mass is markedly hypermetabolic in the confluent lymphadenopathy in the mediastinum and in bilateral hilar regions also shows marked hypermetabolism. 2. Hypermetabolic metastatic lymphadenopathy in the right neck and supraclavicular region. 3. Hypermetabolic lymphadenopathy in the hepato duodenal ligament consistent with metastatic disease. 4. Large hypermetabolic lesion posterior right acetabulum without correlate of CT finding. Features highly suspicious for bony metastatic disease. MRI brain 10/28/2018 1. No metastatic disease or acute intracranial abnormality identified. 2. 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  3. Moderately advanced but nonspecific cerebral white matter signal changes, most commonly due to chronic small vessel disease. Lesser signal changes in the deep gray matter and pons may also be small vessel related.  CT chest abdomen pelvis with contrast 12/15/2018 1 Marked response to therapy of thoracic adenopathy. 2. No new or progressive disease in the chest. 3. Coronary artery atherosclerosis. 4. 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 5. No new or progressive disease in the abdomen or pelvis 6. Pelvic floor laxity, cystocele with bladder wall thickening, suggesting a component of outlet obstruction. 7. Possible vague sclerosis within the posterior right acetabulum, in the region of hypermetabolism on prior PET. Likely an area of treated metastasis. 8. Central line tip in right brachiocephalic vein with surrounding occlusive thrombus. CT chest abdomen pelvis with contrast 01/24/2019 1.  New extensive gas throughout the bladder wall and within the bladder lumen with underlying mild diffuse bladder wall thickening, compatible with acute emphysematous cystitis. 2. Two new subcentimeter low-attenuation lesions in the right kidney, nonspecific but worrisome for mild acute right pyelonephritis given the above bladder findings. No discrete/drainable renal abscess. No hydronephrosis. 3. Continued positive response to therapy with continued reduction in mediastinal adenopathy. No residual measurable left lung mass.Stable faint sclerosis at the site of the hypermetabolic right acetabular osseous lesion. No new or progressive metastatic disease in the chest, abdomen or pelvis. 4. Small bowel containing small periumbilical hernia without bowel complication at this time. 5. Aortic Atherosclerosis (ICD10-I70.0) and Emphysema (ICD10-J43.9).  ASSESSMENT & PLAN:  1. Small cell lung cancer ([place])   2. Hypomagnesemia   3. Radiation-induced esophagitis   4. Neoplasm related pain   5. Odynophagia   6. Chest pain, unspecified type    #Extensive small cell lung cancer S/p 4 cycles of Carboplatin, Etoposide and Tecentriq.  Currently undergoing chest radiation.  Labs are reviewed and discussed with patient. AKI has resolved.  Her blood pressure is 98/64.  Encourage patient to increase oral hydration. Diarrhea has resolved.  C. Difficile antigen is positive, C. difficile toxin negative.  Given that  her diarrhea has stopped, will not put her through another antibiotic treatment for now.  Continue to monitor.  #Hypomagnesia, patient is reluctant to get IV magnesium today.  Advised patient start taking Slow-Mag 1 tablet daily. #Radiation induced esophagitis, continue Carafate 1 g 3 times a day.  Continue omeprazole 40 mg daily. Odynophagia, start trials of Magic mouthwash with lidocaine.  I also give her prescription of morphine 20 mg/5 mils solution prescription, advised patient to take 2.5 mils by  mouth every 4 hours as needed prior to eating.  Prescription sent to pharmacy.  #Neoplasm related pain, patient has been on OxyContin 10 mg twice daily pain is better controlled. #History of CAD, one episode of chest pain during last infusion encounter.-Referred to last visit note for details. No additional chest pain episodes.  Troponin negative.  Likely atypical chest pain secondary to esophagitis.  She has not followed up with cardiologist since the diagnosis of cancer.  Advised patient to make a follow-up appointment with cardiologist Dr. Terrence Dupont  Plan for patient is to come back 2 weeks after she finished chest radiation for restarted on Tecentriq.  If she needs to start on whole brain radiation, I asked the patient to call me and give me an update and we were going to delay the plan of restarting Tecentriq. The patient knows to call the clinic with any problems questions or concerns.  Earlie Server, MD, PhD Hematology Oncology [facility] at  [facility] Pager- [phone] 03/14/2019

## 2019-03-15 ENCOUNTER — Other Ambulatory Visit: Payer: Self-pay

## 2019-03-15 ENCOUNTER — Ambulatory Visit
Admission: RE | Admit: 2019-03-15 | Discharge: 2019-03-15 | Disposition: A | Discharge status: Home / Self Care | Payer: Medicare Other | Source: Ambulatory Visit | Attending: Radiation Oncology | Admitting: Radiation Oncology

## 2019-03-15 DIAGNOSIS — C781 Secondary malignant neoplasm of mediastinum: Secondary | ICD-10-CM | POA: Diagnosis not present

## 2019-03-15 DIAGNOSIS — Z510 Encounter for antineoplastic radiation therapy: Secondary | ICD-10-CM | POA: Diagnosis not present

## 2019-03-15 DIAGNOSIS — C773 Secondary and unspecified malignant neoplasm of axilla and upper limb lymph nodes: Secondary | ICD-10-CM | POA: Diagnosis not present

## 2019-03-15 DIAGNOSIS — C3492 Malignant neoplasm of unspecified part of left bronchus or lung: Secondary | ICD-10-CM | POA: Diagnosis not present

## 2019-03-15 DIAGNOSIS — F17210 Nicotine dependence, cigarettes, uncomplicated: Secondary | ICD-10-CM | POA: Diagnosis not present

## 2019-03-16 ENCOUNTER — Ambulatory Visit
Admission: RE | Admit: 2019-03-16 | Discharge: 2019-03-16 | Disposition: A | Discharge status: Home / Self Care | Payer: Medicare Other | Source: Ambulatory Visit | Attending: Radiation Oncology | Admitting: Radiation Oncology

## 2019-03-16 ENCOUNTER — Other Ambulatory Visit: Payer: Self-pay | Admitting: Oncology

## 2019-03-16 ENCOUNTER — Other Ambulatory Visit: Payer: Self-pay

## 2019-03-16 ENCOUNTER — Inpatient Hospital Stay: Payer: Medicare Other

## 2019-03-16 DIAGNOSIS — C3402 Malignant neoplasm of left main bronchus: Secondary | ICD-10-CM | POA: Diagnosis not present

## 2019-03-16 DIAGNOSIS — C3490 Malignant neoplasm of unspecified part of unspecified bronchus or lung: Secondary | ICD-10-CM

## 2019-03-16 DIAGNOSIS — R0789 Other chest pain: Secondary | ICD-10-CM | POA: Diagnosis not present

## 2019-03-16 DIAGNOSIS — N179 Acute kidney failure, unspecified: Secondary | ICD-10-CM | POA: Diagnosis not present

## 2019-03-16 DIAGNOSIS — E876 Hypokalemia: Secondary | ICD-10-CM | POA: Diagnosis not present

## 2019-03-16 DIAGNOSIS — F17210 Nicotine dependence, cigarettes, uncomplicated: Secondary | ICD-10-CM | POA: Diagnosis not present

## 2019-03-16 DIAGNOSIS — I252 Old myocardial infarction: Secondary | ICD-10-CM | POA: Diagnosis not present

## 2019-03-16 DIAGNOSIS — Z510 Encounter for antineoplastic radiation therapy: Secondary | ICD-10-CM | POA: Diagnosis not present

## 2019-03-16 DIAGNOSIS — C781 Secondary malignant neoplasm of mediastinum: Secondary | ICD-10-CM | POA: Diagnosis not present

## 2019-03-16 DIAGNOSIS — C3492 Malignant neoplasm of unspecified part of left bronchus or lung: Secondary | ICD-10-CM | POA: Diagnosis not present

## 2019-03-16 DIAGNOSIS — C773 Secondary and unspecified malignant neoplasm of axilla and upper limb lymph nodes: Secondary | ICD-10-CM | POA: Diagnosis not present

## 2019-03-16 DIAGNOSIS — I2510 Atherosclerotic heart disease of native coronary artery without angina pectoris: Secondary | ICD-10-CM | POA: Diagnosis not present

## 2019-03-16 LAB — CBC
HCT: 35.5 % — ABNORMAL LOW (ref 36.0–46.0)
Hemoglobin: 11.6 g/dL — ABNORMAL LOW (ref 12.0–15.0)
MCH: 31.2 pg (ref 26.0–34.0)
MCHC: 32.7 g/dL (ref 30.0–36.0)
MCV: 95.4 fL (ref 80.0–100.0)
Platelets: 152 10*3/uL (ref 150–400)
RBC: 3.72 MIL/uL — ABNORMAL LOW (ref 3.87–5.11)
RDW: 12.2 % (ref 11.5–15.5)
WBC: 5.5 10*3/uL (ref 4.0–10.5)
nRBC: 0 % (ref 0.0–0.2)

## 2019-03-17 ENCOUNTER — Ambulatory Visit
Admission: RE | Admit: 2019-03-17 | Discharge: 2019-03-17 | Disposition: A | Discharge status: Home / Self Care | Payer: Medicare Other | Source: Ambulatory Visit | Attending: Radiation Oncology | Admitting: Radiation Oncology

## 2019-03-17 ENCOUNTER — Other Ambulatory Visit: Payer: Self-pay

## 2019-03-17 DIAGNOSIS — C3492 Malignant neoplasm of unspecified part of left bronchus or lung: Secondary | ICD-10-CM | POA: Diagnosis not present

## 2019-03-17 DIAGNOSIS — E559 Vitamin D deficiency, unspecified: Secondary | ICD-10-CM | POA: Diagnosis not present

## 2019-03-17 DIAGNOSIS — C773 Secondary and unspecified malignant neoplasm of axilla and upper limb lymph nodes: Secondary | ICD-10-CM | POA: Diagnosis not present

## 2019-03-17 DIAGNOSIS — I2510 Atherosclerotic heart disease of native coronary artery without angina pectoris: Secondary | ICD-10-CM | POA: Diagnosis not present

## 2019-03-17 DIAGNOSIS — R9431 Abnormal electrocardiogram [ECG] [EKG]: Secondary | ICD-10-CM | POA: Diagnosis not present

## 2019-03-17 DIAGNOSIS — F17210 Nicotine dependence, cigarettes, uncomplicated: Secondary | ICD-10-CM | POA: Diagnosis not present

## 2019-03-17 DIAGNOSIS — Z510 Encounter for antineoplastic radiation therapy: Secondary | ICD-10-CM | POA: Diagnosis not present

## 2019-03-17 DIAGNOSIS — E785 Hyperlipidemia, unspecified: Secondary | ICD-10-CM | POA: Diagnosis not present

## 2019-03-17 DIAGNOSIS — F17290 Nicotine dependence, other tobacco product, uncomplicated: Secondary | ICD-10-CM | POA: Diagnosis not present

## 2019-03-17 DIAGNOSIS — I252 Old myocardial infarction: Secondary | ICD-10-CM | POA: Diagnosis not present

## 2019-03-17 DIAGNOSIS — I10 Essential (primary) hypertension: Secondary | ICD-10-CM | POA: Diagnosis not present

## 2019-03-17 DIAGNOSIS — C781 Secondary malignant neoplasm of mediastinum: Secondary | ICD-10-CM | POA: Diagnosis not present

## 2019-03-20 ENCOUNTER — Other Ambulatory Visit: Payer: Self-pay

## 2019-03-20 ENCOUNTER — Ambulatory Visit
Admission: RE | Admit: 2019-03-20 | Discharge: 2019-03-20 | Disposition: A | Discharge status: Home / Self Care | Payer: Medicare Other | Source: Ambulatory Visit | Attending: Radiation Oncology | Admitting: Radiation Oncology

## 2019-03-20 ENCOUNTER — Ambulatory Visit: Payer: Medicare Other | Admitting: Internal Medicine

## 2019-03-20 DIAGNOSIS — C773 Secondary and unspecified malignant neoplasm of axilla and upper limb lymph nodes: Secondary | ICD-10-CM | POA: Diagnosis not present

## 2019-03-20 DIAGNOSIS — C781 Secondary malignant neoplasm of mediastinum: Secondary | ICD-10-CM | POA: Diagnosis not present

## 2019-03-20 DIAGNOSIS — F17210 Nicotine dependence, cigarettes, uncomplicated: Secondary | ICD-10-CM | POA: Diagnosis not present

## 2019-03-20 DIAGNOSIS — C3492 Malignant neoplasm of unspecified part of left bronchus or lung: Secondary | ICD-10-CM | POA: Diagnosis not present

## 2019-03-20 DIAGNOSIS — Z510 Encounter for antineoplastic radiation therapy: Secondary | ICD-10-CM | POA: Diagnosis not present

## 2019-03-21 ENCOUNTER — Other Ambulatory Visit: Payer: Self-pay

## 2019-03-21 ENCOUNTER — Ambulatory Visit
Admission: RE | Admit: 2019-03-21 | Discharge: 2019-03-21 | Disposition: A | Discharge status: Home / Self Care | Payer: Medicare Other | Source: Ambulatory Visit | Attending: Radiation Oncology | Admitting: Radiation Oncology

## 2019-03-21 DIAGNOSIS — C3492 Malignant neoplasm of unspecified part of left bronchus or lung: Secondary | ICD-10-CM | POA: Diagnosis not present

## 2019-03-21 DIAGNOSIS — C781 Secondary malignant neoplasm of mediastinum: Secondary | ICD-10-CM | POA: Diagnosis not present

## 2019-03-21 DIAGNOSIS — C773 Secondary and unspecified malignant neoplasm of axilla and upper limb lymph nodes: Secondary | ICD-10-CM | POA: Diagnosis not present

## 2019-03-21 DIAGNOSIS — Z510 Encounter for antineoplastic radiation therapy: Secondary | ICD-10-CM | POA: Diagnosis not present

## 2019-03-21 DIAGNOSIS — F17210 Nicotine dependence, cigarettes, uncomplicated: Secondary | ICD-10-CM | POA: Diagnosis not present

## 2019-03-27 ENCOUNTER — Other Ambulatory Visit: Payer: Self-pay | Admitting: *Deleted

## 2019-03-27 DIAGNOSIS — C3490 Malignant neoplasm of unspecified part of unspecified bronchus or lung: Secondary | ICD-10-CM

## 2019-03-29 ENCOUNTER — Encounter: Payer: Self-pay | Admitting: Radiation Oncology

## 2019-03-29 ENCOUNTER — Other Ambulatory Visit: Payer: Self-pay

## 2019-03-29 ENCOUNTER — Ambulatory Visit
Admission: RE | Admit: 2019-03-29 | Discharge: 2019-03-29 | Disposition: A | Discharge status: Home / Self Care | Payer: Medicare Other | Source: Ambulatory Visit | Attending: Radiation Oncology | Admitting: Radiation Oncology

## 2019-03-29 DIAGNOSIS — C3412 Malignant neoplasm of upper lobe, left bronchus or lung: Secondary | ICD-10-CM

## 2019-03-29 NOTE — Progress Notes (Signed)
Radiation Oncology Follow up Note  Name: Vidhi Delellis [facility]   Date:   03/29/2019 MRN:  [id number] DOB: [birth year]    This 73 y.o. female presents to the clinic today for reevaluation for PCI in patient with extensive stage small cell lung cancer.  REFERRING PROVIDER: Glean Hess, MD  HPI: Patient is a 73 year old female who had an excellent response to initial chemotherapy for extensive stage small cell lung cancer.  She was status post 4 cycles carbo etoposide repeat scan showed marked reduction in her chest and mediastinal adenopathy..  We treated with further palliative radiation therapy 4000 cGy over 4 weeks which she tolerated well except for some radiation esophagitis which has resolved.  At this time she is being seen for consideration of PCI.  She otherwise is doing well specifically denies cough hemoptysis or chest tightness.  Having no further swallowing difficulties at this time.  COMPLICATIONS OF TREATMENT: none  FOLLOW UP COMPLIANCE: keeps appointments   PHYSICAL EXAM:  BP (P) 107/76 (BP Location: Right Arm, Patient Position: Sitting)   Pulse (P) 90   Temp (P) 99.1 F (37.3 C) (Tympanic)   Resp (P) 16   Wt (P) 150 lb 2.1 oz (68.1 kg)   BMI (P) 24.98 kg/m  Well-developed well-nourished patient in NAD. HEENT reveals PERLA, EOMI, discs not visualized.  Oral cavity is clear. No oral mucosal lesions are identified. Neck is clear without evidence of cervical or supraclavicular adenopathy. Lungs are clear to A&P. Cardiac examination is essentially unremarkable with regular rate and rhythm without murmur rub or thrill. Abdomen is benign with no organomegaly or masses noted. Motor sensory and DTR levels are equal and symmetric in the upper and lower extremities. Cranial nerves II through XII are grossly intact. Proprioception is intact. No peripheral adenopathy or edema is identified. No motor or sensory levels are noted. Crude visual fields are within normal range.  RADIOLOGY  RESULTS: CT scans MRI scans of her brain and PET/CT scan are all reviewed and compatible with above-stated findings  PLAN: This time like to go ahead with PCI.  We will plan on delivering 2500 cGy in 10 fractions.  Risks and benefits of treatment occluding hair loss fatigue possible alteration of blood counts and slight chance of cognitive decline all were discussed in detail with the patient.  I have personally set up and ordered CT simulation for later this week.  I would like to take this opportunity to thank you for allowing me to participate in the care of your patient.[name], MD

## 2019-03-30 ENCOUNTER — Other Ambulatory Visit: Payer: Self-pay | Admitting: *Deleted

## 2019-03-30 ENCOUNTER — Other Ambulatory Visit: Payer: Self-pay

## 2019-03-30 ENCOUNTER — Other Ambulatory Visit: Payer: Self-pay | Admitting: Oncology

## 2019-03-30 ENCOUNTER — Ambulatory Visit
Admission: RE | Admit: 2019-03-30 | Discharge: 2019-03-30 | Disposition: A | Discharge status: Home / Self Care | Payer: Medicare Other | Source: Ambulatory Visit | Attending: Radiation Oncology | Admitting: Radiation Oncology

## 2019-03-30 DIAGNOSIS — C781 Secondary malignant neoplasm of mediastinum: Secondary | ICD-10-CM | POA: Diagnosis not present

## 2019-03-30 DIAGNOSIS — Z510 Encounter for antineoplastic radiation therapy: Secondary | ICD-10-CM | POA: Insufficient documentation

## 2019-03-30 DIAGNOSIS — F17210 Nicotine dependence, cigarettes, uncomplicated: Secondary | ICD-10-CM | POA: Diagnosis not present

## 2019-03-30 DIAGNOSIS — C3492 Malignant neoplasm of unspecified part of left bronchus or lung: Secondary | ICD-10-CM | POA: Diagnosis not present

## 2019-03-30 DIAGNOSIS — C773 Secondary and unspecified malignant neoplasm of axilla and upper limb lymph nodes: Secondary | ICD-10-CM | POA: Diagnosis not present

## 2019-03-30 DIAGNOSIS — Z298 Encounter for other specified prophylactic measures: Secondary | ICD-10-CM | POA: Diagnosis not present

## 2019-03-30 MED ORDER — OXYCODONE HCL ER 10 MG PO T12A: 10.0000 mg | EXTENDED_RELEASE_TABLET | Freq: Two times a day (BID) | ORAL | 0 refills | Status: DC

## 2019-03-30 MED ORDER — DEXAMETHASONE 4 MG PO TABS: 4.0000 mg | ORAL_TABLET | Freq: Every day | ORAL | 0 refills | Status: DC

## 2019-04-03 ENCOUNTER — Encounter: Payer: Self-pay | Admitting: Oncology

## 2019-04-03 ENCOUNTER — Other Ambulatory Visit: Payer: Self-pay | Admitting: Oncology

## 2019-04-03 ENCOUNTER — Other Ambulatory Visit: Payer: Self-pay

## 2019-04-03 ENCOUNTER — Telehealth: Payer: Self-pay

## 2019-04-03 ENCOUNTER — Inpatient Hospital Stay: Payer: Medicare Other | Attending: Oncology | Admitting: Oncology

## 2019-04-03 DIAGNOSIS — C3490 Malignant neoplasm of unspecified part of unspecified bronchus or lung: Secondary | ICD-10-CM

## 2019-04-03 NOTE — Progress Notes (Signed)
Called pt for WebEx visit.  Patient c/o constipation and diarrhea, medications are helping with these.

## 2019-04-03 NOTE — Progress Notes (Signed)
HEMATOLOGY-ONCOLOGY TeleHEALTH VISIT PROGRESS NOTE  I connected with Kristi [name] on 04/03/19 at 10:00 AM EDT by telephone visit and verified that I am speaking with the correct person using two identifiers. I discussed the limitations, risks, security and privacy concerns of performing an evaluation and management service by telemedicine and the availability of in-person appointments. I also discussed with the patient that there may be a patient responsible charge related to this service. The patient expressed understanding and agreed to proceed.   Other persons participating in the visit and their role in the encounter:  Geraldine Solar, [place], check in patient   Janeann Merl, RN, check in patient.   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  INTERVAL HISTORY Kristi [name] is a 73 y.o. female who has above history reviewed by me today presents for follow up visit for management of small cell lung cancer treatments.  She is supposed to have a WebEx video visit with me.  However she had power being cut off so not able to complete WebEx.  We will proceed with a telephone visit. Problems and complaints are listed below:  Patient was last seen by me on 03/14/2019.  At that time she had hypotension and nausea.  She was given supportive care and symptoms improved. She was recently seen by radiation oncology and Dr. Olena Leatherwood notes are reviewed. She was planned to start with whole brain radiation on 04/05/2019. Patient reports doing well.  Denies any nausea, vomiting, notes of breath, chest pain, abdominal pain.  Diarrhea has stopped.  Fever or chills.  Review of Systems  Constitutional: Negative for appetite change, chills, fatigue and fever.  HENT:   Negative for hearing loss and voice change.   Eyes: Negative for eye problems.  Respiratory: Negative for chest tightness and cough.   Cardiovascular: Negative for  chest pain.  Gastrointestinal: Negative for abdominal distention, abdominal pain and blood in stool.  Endocrine: Negative for hot flashes.  Genitourinary: Negative for difficulty urinating and frequency.   Musculoskeletal: Negative for arthralgias.  Skin: Negative for itching and rash.  Neurological: Negative for extremity weakness.  Hematological: Negative for adenopathy.  Psychiatric/Behavioral: Negative for confusion.    Past Medical History:  Diagnosis Date  . Acute MI, inferoposterior wall ([place]) 09/30/2014  . Claustrophobia   . Coronary artery disease   . Hypercholesteremia   . Hypertension   . MI, old   . 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 APPENDECTOMY     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  . BLADDER NECK SUSPENSION    . CHOLECYSTECTOMY N/A 08/14/2018   Procedure: LAPAROSCOPIC CHOLECYSTECTOMY;  Surgeon: Jules Husbands, MD;  Location: [facility] ORS;  Service: General;  Laterality: N/A;  . CHOLECYSTECTOMY  11/2018  . CORONARY STENT PLACEMENT    . ENDOBRONCHIAL ULTRASOUND N/A 10/21/2018   Procedure: ENDOBRONCHIAL ULTRASOUND;  Surgeon: Laverle Hobby, MD;  Location: [facility] ORS;  Service: Pulmonary;  Laterality: N/A;  . HERNIA REPAIR    . IR FLUORO GUIDE CV LINE RIGHT  12/19/2018  . LEFT HEART CATHETERIZATION WITH CORONARY ANGIOGRAM N/A 09/29/2014   Procedure: LEFT HEART CATHETERIZATION WITH CORONARY ANGIOGRAM;  Surgeon: Clent Demark, MD;  Location: [facility] CATH LAB;  Service: Cardiovascular;  Laterality: N/A;  . PORTA CATH INSERTION N/A 01/02/2019   Procedure: PORTA CATH INSERTION;  Surgeon: Algernon Huxley, MD;  Location: [facility] CV LAB;  Service: Cardiovascular;  Laterality: N/A;  . PORTACATH PLACEMENT Right 10/28/2018   Procedure: INSERTION PORT-A-CATH;  Surgeon: Jules Husbands, MD;  Location: [facility] ORS;  Service: General;  Laterality: Right;    Family History  Problem Relation Age of Onset  . Alzheimer's disease  Mother   . Colon cancer Father   . Breast cancer Neg Hx     Social History   Socioeconomic History  . Marital status: Married    Spouse name: [name]   . Number of children: 3  . Years of education: Not on file  . Highest education level: Not on file  Occupational History  . Occupation: Retired    Comment: Chief Technology Officer   Social Needs  . Financial resource strain: Not very hard  . Food insecurity:    Worry: Not on file    Inability: Not on file  . Transportation needs:    Medical: No    Non-medical: No  Tobacco Use  . Smoking status: Former Smoker    Packs/day: 1.00    Years: 39.00    Pack years: 39.00    Types: Cigarettes    Start date: 12/21/1978    Last attempt to quit: 10/16/2018    Years since quitting: 0.4  . Smokeless tobacco: Never Used  Substance and Sexual Activity  . Alcohol use: Yes    Alcohol/week: 0.0 standard drinks    Comment: occassional - approx 1 every 2 weeks   . Drug use: No  . Sexual activity: Yes    Birth control/protection: None  Lifestyle  . Physical activity:    Days per week: 0 days    Minutes per session: Not on file  . Stress: To some extent  Relationships  . Social connections:    Talks on phone: Three times a week    Gets together: Once a week    Attends religious service: 1 to 4 times per year    Active member of club or organization: Not on file    Attends meetings of clubs or organizations: Not on file    Relationship status: Married  . Intimate partner violence:    Fear of current or ex partner: No    Emotionally abused: No    Physically abused: No    Forced sexual activity: No  Other Topics Concern  . Not on file  Social History Narrative  . Not on file    Current Outpatient Medications on File Prior to Visit  Medication Sig Dispense Refill  . albuterol (PROAIR HFA) 108 (90 Base) MCG/ACT inhaler Inhale 2 puffs into the lungs every 6 (six) hours as needed for wheezing or shortness of breath. 18 g 2  . atorvastatin  (LIPITOR) 80 MG tablet Take 0.5 tablets (40 mg total) by mouth daily at 6 PM. (Patient taking differently: Take 20 mg by mouth 2 (two) times daily. ) 30 tablet 3  . B Complex Vitamins (VITAMIN B COMPLEX PO) Take 1 tablet by mouth daily.     [name] Kitchen CALCIUM PO Take 1 tablet by mouth daily.     . Cholecalciferol (VITAMIN D) 2000 units CAPS Take 1 capsule (2,000 Units total) by mouth daily. 30 capsule   . dexamethasone (DECADRON) 4 MG tablet Take 1 tablet (4 mg total) by mouth daily. 25 tablet 0  . lidocaine-prilocaine (EMLA) cream Apply to affected area once 30 g 3  . metoprolol tartrate (LOPRESSOR) 25 MG tablet Take 12.5 mg by mouth 2 (two) times daily.    [name] Kitchen morphine  20 MG/5ML solution Take 2.5 mLs (10 mg total) by mouth every 4 (four) hours as needed for pain. 100 mL 0  . nitroGLYCERIN (NITROSTAT) 0.4 MG SL tablet Place 1 tablet (0.4 mg total) under the tongue every 5 (five) minutes x 3 doses as needed for chest pain. 25 tablet 12  . omeprazole (PRILOSEC) 40 MG capsule Take 1 capsule (40 mg total) by mouth daily. 90 capsule 1  . ondansetron (ZOFRAN) 8 MG tablet Take 1 tablet (8 mg total) by mouth 2 (two) times daily as needed for refractory nausea / vomiting. Start on day 3 after carboplatin chemo. 30 tablet 1  . oxyCODONE (OXYCONTIN) 10 mg 12 hr tablet Take 1 tablet (10 mg total) by mouth every 12 (twelve) hours. 60 tablet 0  . polyethylene glycol (MIRALAX / GLYCOLAX) packet Take 17 g by mouth daily as needed.     . potassium chloride SA (K-DUR,KLOR-CON) 20 MEQ tablet TAKE 1 TABLET(20 MEQ) BY MOUTH DAILY 90 tablet 1  . prochlorperazine (COMPAZINE) 10 MG tablet TAKE 1 TABLET BY MOUTH EVERY 6 HOURS AS NEEDED FOR NAUSEA OR VOMITING 337 tablet 1  . sertraline (ZOLOFT) 50 MG tablet Take 1 tablet (50 mg total) by mouth daily. 30 tablet 1   Current Facility-Administered Medications on File Prior to Visit  Medication Dose Route Frequency Provider Last Rate Last Dose  . heparin lock flush 100 unit/mL  500 Units  Intravenous Once Earlie Server, MD      . sodium chloride flush (NS) 0.9 % injection 10 mL  10 mL Intravenous PRN Earlie Server, MD   10 mL at 01/09/19 0820  . sodium chloride flush (NS) 0.9 % injection 10 mL  10 mL Intravenous PRN Earlie Server, MD   10 mL at 01/10/19 1400    No Known Allergies     Observations/Objective: Today's Vitals   04/03/19 1023  PainSc: 0-No pain   There is no height or weight on file to calculate BMI.   CBC    Component Value Date/Time   WBC 5.5 03/16/2019 0920   RBC 3.72 (L) 03/16/2019 0920   HGB 11.6 (L) 03/16/2019 0920   HGB 14.5 09/19/2018 1038   HCT 35.5 (L) 03/16/2019 0920   HCT 42.7 09/19/2018 1038   PLT 152 03/16/2019 0920   PLT 265 09/19/2018 1038   MCV 95.4 03/16/2019 0920   MCV 91 09/19/2018 1038   MCH 31.2 03/16/2019 0920   MCHC 32.7 03/16/2019 0920   RDW 12.2 03/16/2019 0920   RDW 15.0 09/19/2018 1038   LYMPHSABS 1.1 03/09/2019 1236   LYMPHSABS 2.0 09/19/2018 1038   MONOABS 0.9 03/09/2019 1236   EOSABS 0.2 03/09/2019 1236   EOSABS 0.3 09/19/2018 1038   BASOSABS 0.0 03/09/2019 1236   BASOSABS 0.1 09/19/2018 1038    CMP     Component Value Date/Time   NA 138 03/14/2019 0916   NA 136 09/19/2018 1038   K 3.5 03/14/2019 0916   CL 105 03/14/2019 0916   CO2 26 03/14/2019 0916   GLUCOSE 93 03/14/2019 0916   BUN 14 03/14/2019 0916   BUN 6 (L) 09/19/2018 1038   CREATININE 0.73 03/14/2019 0916   CALCIUM 8.8 (L) 03/14/2019 0916   PROT 6.9 03/14/2019 0916   PROT 7.4 09/19/2018 1038   ALBUMIN 3.6 03/14/2019 0916   ALBUMIN 4.3 09/19/2018 1038   AST 30 03/14/2019 0916   ALT 30 03/14/2019 0916   ALKPHOS 113 03/14/2019 0916   BILITOT 0.4 03/14/2019 0916  BILITOT 0.5 09/19/2018 1038   GFRNONAA >60 03/14/2019 0916   GFRAA >60 03/14/2019 0916     Assessment and Plan: 1. Small cell lung cancer [facility])     Discussed with patient that since she is going to start with whole brain radiation, we will continue hold Tecentriq as immunotherapy and  radiation concurrently may increase her risk of immunotherapy/radiation related toxicities. She is going to finish radiation on 04/19/2019 Plan to restart Tecentriq maintenance on 04/24/2019. Patient voices understanding and agree with the plan. Follow Up Instructions: 04/24/2023 labs MD assessment and Tecentriq treatment.   I discussed the assessment and treatment plan with the patient. The patient was provided an opportunity to ask questions and all were answered. The patient agreed with the plan and demonstrated an understanding of the instructions.  The patient was advised to call back or seek an in-person evaluation if the symptoms worsen or if the condition fails to improve as anticipated.   I provided 12 minutes of non face-to-face telephone visit time during this encounter, and > 50% was spent counseling as documented under my assessment & plan.  Earlie Server, MD 04/03/2019 12:11 PM

## 2019-04-03 NOTE — Telephone Encounter (Signed)
Nutrition Follow-up:  Patient with lung cancer followed by Dr. Tasia Catchings.  Patient has completed chemo and radiation therapy.  Planning to start radiation to brain and tecentriq to follow.    Called patient for nutrition follow-up.  Patient reports that her appetite is good.  Reports that she is drinking premier protein about 1 per day or 1 every other day.  Reports that she is eating breakfast and late lunch/early supper daily.  Unable to tell RD what she is eating at these meals.  Reports she can't talk long.  Denies any nutrition impact symptoms at this time.      Medications: reviewed  Labs: reviewed  Anthropometrics:   No new weight taken since 2/10 of 148 lb.  Patient reports that she is staying about 147-150 lb   NUTRITION DIAGNOSIS: Inadequate oral intake stable   INTERVENTION:  Encouraged patient to continue with oral nutrition supplements.  Does not like ensure/boost shakes.  Encouraged high calorie, high protein foods.      MONITORING, EVALUATION, GOAL: weight trends, intake   NEXT VISIT: patient agreeable to phone visit will plan Monday, May 4  Lupe Handley B. Zenia Resides, [place], [place] Registered Dietitian [phone] (pager)

## 2019-04-04 ENCOUNTER — Inpatient Hospital Stay: Payer: Medicare Other

## 2019-04-04 ENCOUNTER — Other Ambulatory Visit: Payer: Self-pay

## 2019-04-04 ENCOUNTER — Inpatient Hospital Stay: Payer: Medicare Other | Admitting: Oncology

## 2019-04-04 ENCOUNTER — Inpatient Hospital Stay: Payer: Medicare Other | Admitting: Hospice and Palliative Medicine

## 2019-04-05 ENCOUNTER — Ambulatory Visit
Admission: RE | Admit: 2019-04-05 | Discharge: 2019-04-05 | Disposition: A | Discharge status: Home / Self Care | Payer: Medicare Other | Source: Ambulatory Visit | Attending: Radiation Oncology | Admitting: Radiation Oncology

## 2019-04-05 ENCOUNTER — Other Ambulatory Visit: Payer: Self-pay

## 2019-04-05 DIAGNOSIS — C773 Secondary and unspecified malignant neoplasm of axilla and upper limb lymph nodes: Secondary | ICD-10-CM | POA: Diagnosis not present

## 2019-04-05 DIAGNOSIS — Z510 Encounter for antineoplastic radiation therapy: Secondary | ICD-10-CM | POA: Diagnosis not present

## 2019-04-05 DIAGNOSIS — F17210 Nicotine dependence, cigarettes, uncomplicated: Secondary | ICD-10-CM | POA: Diagnosis not present

## 2019-04-05 DIAGNOSIS — C781 Secondary malignant neoplasm of mediastinum: Secondary | ICD-10-CM | POA: Diagnosis not present

## 2019-04-05 DIAGNOSIS — Z298 Encounter for other specified prophylactic measures: Secondary | ICD-10-CM | POA: Diagnosis not present

## 2019-04-05 DIAGNOSIS — C3492 Malignant neoplasm of unspecified part of left bronchus or lung: Secondary | ICD-10-CM | POA: Diagnosis not present

## 2019-04-06 ENCOUNTER — Other Ambulatory Visit: Payer: Self-pay

## 2019-04-06 ENCOUNTER — Ambulatory Visit
Admission: RE | Admit: 2019-04-06 | Discharge: 2019-04-06 | Disposition: A | Discharge status: Home / Self Care | Payer: Medicare Other | Source: Ambulatory Visit | Attending: Radiation Oncology | Admitting: Radiation Oncology

## 2019-04-06 DIAGNOSIS — C781 Secondary malignant neoplasm of mediastinum: Secondary | ICD-10-CM | POA: Diagnosis not present

## 2019-04-06 DIAGNOSIS — Z510 Encounter for antineoplastic radiation therapy: Secondary | ICD-10-CM | POA: Diagnosis not present

## 2019-04-07 ENCOUNTER — Ambulatory Visit
Admission: RE | Admit: 2019-04-07 | Discharge: 2019-04-07 | Disposition: A | Discharge status: Home / Self Care | Payer: Medicare Other | Source: Ambulatory Visit | Attending: Radiation Oncology | Admitting: Radiation Oncology

## 2019-04-07 ENCOUNTER — Other Ambulatory Visit: Payer: Self-pay

## 2019-04-07 DIAGNOSIS — C781 Secondary malignant neoplasm of mediastinum: Secondary | ICD-10-CM | POA: Diagnosis not present

## 2019-04-07 DIAGNOSIS — Z510 Encounter for antineoplastic radiation therapy: Secondary | ICD-10-CM | POA: Diagnosis not present

## 2019-04-10 ENCOUNTER — Other Ambulatory Visit: Payer: Self-pay

## 2019-04-10 ENCOUNTER — Ambulatory Visit
Admission: RE | Admit: 2019-04-10 | Discharge: 2019-04-10 | Disposition: A | Discharge status: Home / Self Care | Payer: Medicare Other | Source: Ambulatory Visit | Attending: Radiation Oncology | Admitting: Radiation Oncology

## 2019-04-10 DIAGNOSIS — C781 Secondary malignant neoplasm of mediastinum: Secondary | ICD-10-CM | POA: Diagnosis not present

## 2019-04-10 DIAGNOSIS — Z510 Encounter for antineoplastic radiation therapy: Secondary | ICD-10-CM | POA: Diagnosis not present

## 2019-04-11 ENCOUNTER — Other Ambulatory Visit: Payer: Self-pay

## 2019-04-11 ENCOUNTER — Ambulatory Visit
Admission: RE | Admit: 2019-04-11 | Discharge: 2019-04-11 | Disposition: A | Discharge status: Home / Self Care | Payer: Medicare Other | Source: Ambulatory Visit | Attending: Radiation Oncology | Admitting: Radiation Oncology

## 2019-04-11 DIAGNOSIS — Z510 Encounter for antineoplastic radiation therapy: Secondary | ICD-10-CM | POA: Diagnosis not present

## 2019-04-11 DIAGNOSIS — C781 Secondary malignant neoplasm of mediastinum: Secondary | ICD-10-CM | POA: Diagnosis not present

## 2019-04-12 ENCOUNTER — Ambulatory Visit
Admission: RE | Admit: 2019-04-12 | Discharge: 2019-04-12 | Disposition: A | Discharge status: Home / Self Care | Payer: Medicare Other | Source: Ambulatory Visit | Attending: Radiation Oncology | Admitting: Radiation Oncology

## 2019-04-12 ENCOUNTER — Other Ambulatory Visit: Payer: Self-pay

## 2019-04-12 DIAGNOSIS — Z510 Encounter for antineoplastic radiation therapy: Secondary | ICD-10-CM | POA: Diagnosis not present

## 2019-04-12 DIAGNOSIS — C773 Secondary and unspecified malignant neoplasm of axilla and upper limb lymph nodes: Secondary | ICD-10-CM | POA: Diagnosis not present

## 2019-04-12 DIAGNOSIS — Z298 Encounter for other specified prophylactic measures: Secondary | ICD-10-CM | POA: Diagnosis not present

## 2019-04-12 DIAGNOSIS — C3492 Malignant neoplasm of unspecified part of left bronchus or lung: Secondary | ICD-10-CM | POA: Diagnosis not present

## 2019-04-12 DIAGNOSIS — C781 Secondary malignant neoplasm of mediastinum: Secondary | ICD-10-CM | POA: Diagnosis not present

## 2019-04-12 DIAGNOSIS — F17210 Nicotine dependence, cigarettes, uncomplicated: Secondary | ICD-10-CM | POA: Diagnosis not present

## 2019-04-13 ENCOUNTER — Ambulatory Visit
Admission: RE | Admit: 2019-04-13 | Discharge: 2019-04-13 | Disposition: A | Discharge status: Home / Self Care | Payer: Medicare Other | Source: Ambulatory Visit | Attending: Radiation Oncology | Admitting: Radiation Oncology

## 2019-04-13 ENCOUNTER — Encounter: Payer: Self-pay | Admitting: Oncology

## 2019-04-13 ENCOUNTER — Other Ambulatory Visit: Payer: Self-pay

## 2019-04-13 DIAGNOSIS — C781 Secondary malignant neoplasm of mediastinum: Secondary | ICD-10-CM | POA: Diagnosis not present

## 2019-04-13 DIAGNOSIS — Z510 Encounter for antineoplastic radiation therapy: Secondary | ICD-10-CM | POA: Diagnosis not present

## 2019-04-14 ENCOUNTER — Other Ambulatory Visit: Payer: Self-pay

## 2019-04-14 ENCOUNTER — Ambulatory Visit
Admission: RE | Admit: 2019-04-14 | Discharge: 2019-04-14 | Disposition: A | Discharge status: Home / Self Care | Payer: Medicare Other | Source: Ambulatory Visit | Attending: Radiation Oncology | Admitting: Radiation Oncology

## 2019-04-14 DIAGNOSIS — C781 Secondary malignant neoplasm of mediastinum: Secondary | ICD-10-CM | POA: Diagnosis not present

## 2019-04-14 DIAGNOSIS — Z510 Encounter for antineoplastic radiation therapy: Secondary | ICD-10-CM | POA: Diagnosis not present

## 2019-04-17 ENCOUNTER — Other Ambulatory Visit: Payer: Self-pay

## 2019-04-17 ENCOUNTER — Ambulatory Visit
Admission: RE | Admit: 2019-04-17 | Discharge: 2019-04-17 | Disposition: A | Discharge status: Home / Self Care | Payer: Medicare Other | Source: Ambulatory Visit | Attending: Radiation Oncology | Admitting: Radiation Oncology

## 2019-04-17 DIAGNOSIS — Z510 Encounter for antineoplastic radiation therapy: Secondary | ICD-10-CM | POA: Diagnosis not present

## 2019-04-17 DIAGNOSIS — C781 Secondary malignant neoplasm of mediastinum: Secondary | ICD-10-CM | POA: Diagnosis not present

## 2019-04-18 ENCOUNTER — Other Ambulatory Visit: Payer: Self-pay

## 2019-04-18 ENCOUNTER — Ambulatory Visit
Admission: RE | Admit: 2019-04-18 | Discharge: 2019-04-18 | Disposition: A | Discharge status: Home / Self Care | Payer: Medicare Other | Source: Ambulatory Visit | Attending: Radiation Oncology | Admitting: Radiation Oncology

## 2019-04-18 DIAGNOSIS — C781 Secondary malignant neoplasm of mediastinum: Secondary | ICD-10-CM | POA: Diagnosis not present

## 2019-04-18 DIAGNOSIS — Z510 Encounter for antineoplastic radiation therapy: Secondary | ICD-10-CM | POA: Diagnosis not present

## 2019-04-19 ENCOUNTER — Ambulatory Visit
Admission: RE | Admit: 2019-04-19 | Discharge: 2019-04-19 | Disposition: A | Discharge status: Home / Self Care | Payer: Medicare Other | Source: Ambulatory Visit | Attending: Radiation Oncology | Admitting: Radiation Oncology

## 2019-04-19 ENCOUNTER — Other Ambulatory Visit: Payer: Self-pay

## 2019-04-19 DIAGNOSIS — C773 Secondary and unspecified malignant neoplasm of axilla and upper limb lymph nodes: Secondary | ICD-10-CM | POA: Diagnosis not present

## 2019-04-19 DIAGNOSIS — F17210 Nicotine dependence, cigarettes, uncomplicated: Secondary | ICD-10-CM | POA: Diagnosis not present

## 2019-04-19 DIAGNOSIS — C781 Secondary malignant neoplasm of mediastinum: Secondary | ICD-10-CM | POA: Diagnosis not present

## 2019-04-19 DIAGNOSIS — Z298 Encounter for other specified prophylactic measures: Secondary | ICD-10-CM | POA: Diagnosis not present

## 2019-04-19 DIAGNOSIS — Z510 Encounter for antineoplastic radiation therapy: Secondary | ICD-10-CM | POA: Diagnosis not present

## 2019-04-19 DIAGNOSIS — C3492 Malignant neoplasm of unspecified part of left bronchus or lung: Secondary | ICD-10-CM | POA: Diagnosis not present

## 2019-04-21 ENCOUNTER — Telehealth: Payer: Self-pay | Admitting: Oncology

## 2019-04-23 ENCOUNTER — Other Ambulatory Visit: Payer: Self-pay

## 2019-04-24 ENCOUNTER — Inpatient Hospital Stay: Payer: Medicare Other | Attending: Oncology

## 2019-04-24 ENCOUNTER — Other Ambulatory Visit: Payer: Self-pay

## 2019-04-24 ENCOUNTER — Inpatient Hospital Stay (HOSPITAL_BASED_OUTPATIENT_CLINIC_OR_DEPARTMENT_OTHER): Payer: Medicare Other | Admitting: Oncology

## 2019-04-24 ENCOUNTER — Inpatient Hospital Stay: Payer: Medicare Other

## 2019-04-24 ENCOUNTER — Inpatient Hospital Stay: Payer: Medicare Other | Admitting: Hospice and Palliative Medicine

## 2019-04-24 ENCOUNTER — Encounter: Payer: Self-pay | Admitting: Oncology

## 2019-04-24 VITALS — BP 112/72 | HR 82 | Resp 20

## 2019-04-24 VITALS — BP 113/17 | HR 87 | Temp 97.8°F | Resp 20 | Ht 65.0 in | Wt 152.4 lb

## 2019-04-24 DIAGNOSIS — K208 Other esophagitis without bleeding: Secondary | ICD-10-CM

## 2019-04-24 DIAGNOSIS — R5383 Other fatigue: Secondary | ICD-10-CM

## 2019-04-24 DIAGNOSIS — C3490 Malignant neoplasm of unspecified part of unspecified bronchus or lung: Secondary | ICD-10-CM | POA: Diagnosis not present

## 2019-04-24 DIAGNOSIS — Z87891 Personal history of nicotine dependence: Secondary | ICD-10-CM | POA: Diagnosis not present

## 2019-04-24 DIAGNOSIS — E871 Hypo-osmolality and hyponatremia: Secondary | ICD-10-CM | POA: Insufficient documentation

## 2019-04-24 DIAGNOSIS — F329 Major depressive disorder, single episode, unspecified: Secondary | ICD-10-CM | POA: Diagnosis not present

## 2019-04-24 DIAGNOSIS — Z5112 Encounter for antineoplastic immunotherapy: Secondary | ICD-10-CM | POA: Insufficient documentation

## 2019-04-24 DIAGNOSIS — G893 Neoplasm related pain (acute) (chronic): Secondary | ICD-10-CM | POA: Diagnosis not present

## 2019-04-24 DIAGNOSIS — Z79899 Other long term (current) drug therapy: Secondary | ICD-10-CM | POA: Diagnosis not present

## 2019-04-24 DIAGNOSIS — Z95828 Presence of other vascular implants and grafts: Secondary | ICD-10-CM

## 2019-04-24 LAB — COMPREHENSIVE METABOLIC PANEL
ALT: 37 U/L (ref 0–44)
AST: 36 U/L (ref 15–41)
Albumin: 3.4 g/dL — ABNORMAL LOW (ref 3.5–5.0)
Alkaline Phosphatase: 112 U/L (ref 38–126)
Anion gap: 8 (ref 5–15)
BUN: 17 mg/dL (ref 8–23)
CO2: 24 mmol/L (ref 22–32)
Calcium: 8.8 mg/dL — ABNORMAL LOW (ref 8.9–10.3)
Chloride: 101 mmol/L (ref 98–111)
Creatinine, Ser: 0.84 mg/dL (ref 0.44–1.00)
GFR calc Af Amer: >60 mL/min (ref >60)
GFR calc non Af Amer: >60 mL/min (ref >60)
Glucose, Bld: 111 mg/dL — ABNORMAL HIGH (ref 70–99)
Potassium: 3.6 mmol/L (ref 3.5–5.1)
Sodium: 133 mmol/L — ABNORMAL LOW (ref 135–145)
Total Bilirubin: 0.5 mg/dL (ref 0.3–1.2)
Total Protein: 6.7 g/dL (ref 6.5–8.1)

## 2019-04-24 LAB — CBC WITH DIFFERENTIAL/PLATELET
Abs Immature Granulocytes: 0.07 10*3/uL (ref 0.00–0.07)
Basophils Absolute: 0 10*3/uL (ref 0.0–0.1)
Basophils Relative: 0 %
Eosinophils Absolute: 0.2 10*3/uL (ref 0.0–0.5)
Eosinophils Relative: 4 %
HCT: 35.8 % — ABNORMAL LOW (ref 36.0–46.0)
Hemoglobin: 12 g/dL (ref 12.0–15.0)
Immature Granulocytes: 1 %
Lymphocytes Relative: 14 %
Lymphs Abs: 0.8 10*3/uL (ref 0.7–4.0)
MCH: 30.5 pg (ref 26.0–34.0)
MCHC: 33.5 g/dL (ref 30.0–36.0)
MCV: 91.1 fL (ref 80.0–100.0)
Monocytes Absolute: 0.9 10*3/uL (ref 0.1–1.0)
Monocytes Relative: 16 %
Neutro Abs: 3.8 10*3/uL (ref 1.7–7.7)
Neutrophils Relative %: 65 %
Platelets: 143 10*3/uL — ABNORMAL LOW (ref 150–400)
RBC: 3.93 MIL/uL (ref 3.87–5.11)
RDW: 13.2 % (ref 11.5–15.5)
WBC: 5.9 10*3/uL (ref 4.0–10.5)
nRBC: 0 % (ref 0.0–0.2)

## 2019-04-24 LAB — TSH: TSH: 1.709 u[IU]/mL (ref 0.350–4.500)

## 2019-04-24 MED ORDER — SODIUM CHLORIDE 0.9 % IV SOLN
Freq: Once | INTRAVENOUS | Status: AC
  Administered 2019-04-24: 10:00:00 via INTRAVENOUS
  Filled 2019-04-24: qty 250

## 2019-04-24 MED ORDER — HEPARIN SOD (PORK) LOCK FLUSH 100 UNIT/ML IV SOLN
500.0000 [IU] | Freq: Once | INTRAVENOUS | Status: AC | PRN
  Administered 2019-04-24: 500 [IU]
  Filled 2019-04-24: qty 5

## 2019-04-24 MED ORDER — SODIUM CHLORIDE 0.9 % IV SOLN
1200.0000 mg | Freq: Once | INTRAVENOUS | Status: AC
  Administered 2019-04-24: 1200 mg via INTRAVENOUS
  Filled 2019-04-24: qty 20

## 2019-04-24 MED ORDER — SODIUM CHLORIDE 0.9% FLUSH
10.0000 mL | Freq: Once | INTRAVENOUS | Status: AC
  Administered 2019-04-24: 09:00:00 10 mL via INTRAVENOUS
  Filled 2019-04-24: qty 10

## 2019-04-24 MED ORDER — PROCHLORPERAZINE MALEATE 10 MG PO TABS: ORAL_TABLET | ORAL | 1 refills | Status: DC

## 2019-04-24 MED ORDER — DEXAMETHASONE SODIUM PHOSPHATE 10 MG/ML IJ SOLN
10.0000 mg | Freq: Once | INTRAMUSCULAR | Status: AC
  Administered 2019-04-24: 10 mg via INTRAVENOUS
  Filled 2019-04-24: qty 1

## 2019-04-24 NOTE — Progress Notes (Signed)
Hematology/Oncology Follow up note [facility] [phone] [phone]   Patient Care Team: Glean Hess, MD as PCP - General (Internal Medicine) Charolette Forward, MD as Consulting Physician (Cardiology) Telford Nab, RN as Registered Nurse  REFERRING PROVIDER: Dr. 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 Patient recently was referred to see pulmonology Dr. Felicie Morn for persistent cough and dyspnea.  Patient reports coughing up clear sputum for about 2 months.  No hemoptysis.  She also had weight loss. Former smoker, quitting smoking 2 weeks ago.  39-pack-year smoking history  09/30/2018 chest x-ray showed new market volume loss in the left chest area of the left mainstem bronchus worrisome for centrally obstructing mass. 10/17/2018 chest x-ray showed volume loss worsened in the left hemithorax worsening patchy consolidation in the mid to lower left lung with minimal residual aeration and up in the left lung mass not excluded.  Possible small left pleural effusion. 10/31 2019 CT chest with contrast showed large mediastinal mass involving both hilar, left greater than right, consistent with lung carcinoma The mass causes narrowing of the left mainstem bronchus with resultant volume loss on the left and a mediastinal shift to the left.  Moderate size left pleural effusion. 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  Today patient was accompanied by husband and daughter to clinic to discuss diagnosis and management plan. She reports feeling tired feels shortness of breath with exertion.  Persistent coughing, productive with clear sputum. Also  reports difficulty swallowing, feels food sticking in her food pipe. MRI brain was obtained which was negative for intracranial metastatic disease.   01/24/2019 interim CT scan done which showed continued positive response to therapy with continued reduction in mediastinal adenopathy.  No residual measurable left lung mass. However she developed new extensive gas throughout the bladder wall and within the bladder lumen with underlying mild diffuse bladder wall thickening.  Concerning for acute emphysematous cystitis.  There is also to new subcentimeter low-attenuation lesion in the right kidney.  Nonspecific but worrisome for right pyelonephritis. I instructed patient to go to emergency room for further evaluation.  Patient was admitted for treatment for acute cystitis.  Urology was consulted and evaluate patient during her hospitalization.  Urology did not feel that patient has pyelonephritis and recommend treatment with antibiotics because patient's immunocompromise.  Patient was on IV cephapirin and then transitioned to p.o.cefpodoxime at discharge.  Previous oncology treatment 11/02/2018 -01/09/2023 cycles of carbo/etoposide/Tecentriq Chest and whole brain radiation finished in May 2020 04/25/2019 resume Tecentriq every 3 weeks.  INTERVAL HISTORY Kristi [name] is a 73 y.o. female who has above history reviewed by me today presents for evaluation prior to immunotherapy for treatment of extensive small cell lung cancer. Patient has complete chest and whole brain radiation last week.  Tolerating fine. Reports being forgetful. She also appears to be emotional today and is tearful.  Denies any suicidal ideation. Reports no pain in most of the days.  Occasionally she has some upper abdomen pain.  She takes her long-acting narcotics.  Sometimes she takes hydrocodone as needed.  She also takes morphine liquid when she feels something sticking her throat.  Chronic fatigue at baseline.   Review of  Systems  Constitutional: Positive for fatigue. Negative for appetite change, chills and fever.  HENT:   Negative for hearing loss and voice change.   Eyes: Negative for eye problems.  Respiratory: Negative for chest tightness, cough and shortness of breath.   Cardiovascular: Negative for chest pain.  Gastrointestinal: Negative for abdominal distention, abdominal pain, blood in stool, diarrhea and nausea.  Endocrine: Negative for hot flashes.  Genitourinary: Negative for difficulty urinating and frequency.   Musculoskeletal: Negative for arthralgias.  Skin: Negative for itching and rash.  Neurological: Negative for extremity weakness.  Hematological: Negative for adenopathy.  Psychiatric/Behavioral: Positive for depression. Negative for confusion. The patient is nervous/anxious.      MEDICAL HISTORY:  Past Medical History:  Diagnosis Date  . Acute MI, inferoposterior wall ([place]) 09/30/2014  . Claustrophobia   . Coronary artery disease   . Hypercholesteremia   . Hypertension   . MI, old   . 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 [facility]) 10/27/2018    SURGICAL HISTORY: Past Surgical History:  Procedure Laterality Date  . APPENDECTOMY     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  . BLADDER NECK SUSPENSION    . CHOLECYSTECTOMY N/A 08/14/2018   Procedure: LAPAROSCOPIC CHOLECYSTECTOMY;  Surgeon: Jules Husbands, MD;  Location: [facility] ORS;  Service: General;  Laterality: N/A;  . CHOLECYSTECTOMY  11/2018  . CORONARY STENT PLACEMENT    . ENDOBRONCHIAL ULTRASOUND N/A 10/21/2018   Procedure: ENDOBRONCHIAL ULTRASOUND;  Surgeon: Laverle Hobby, MD;  Location: [facility] ORS;  Service: Pulmonary;  Laterality: N/A;  . HERNIA REPAIR    . IR FLUORO GUIDE CV LINE RIGHT  12/19/2018  . LEFT HEART CATHETERIZATION WITH CORONARY ANGIOGRAM N/A 09/29/2014   Procedure: LEFT HEART CATHETERIZATION WITH CORONARY ANGIOGRAM;  Surgeon: Clent Demark, MD;  Location: [facility] CATH LAB;  Service: Cardiovascular;  Laterality: N/A;  . PORTA  CATH INSERTION N/A 01/02/2019   Procedure: PORTA CATH INSERTION;  Surgeon: Algernon Huxley, MD;  Location: [place] CV LAB;  Service: Cardiovascular;  Laterality: N/A;  . PORTACATH PLACEMENT Right 10/28/2018   Procedure: INSERTION PORT-A-CATH;  Surgeon: Jules Husbands, MD;  Location: [facility] ORS;  Service: General;  Laterality: Right;    SOCIAL HISTORY: Social History   Socioeconomic History  . Marital status: Married    Spouse name: [name]   . Number of children: 3  . Years of education: Not on file  . Highest education level: Not on file  Occupational History  . Occupation: Retired    Comment: Chief Technology Officer   Social Needs  . Financial resource strain: Not very hard  . Food insecurity:    Worry: Not on file    Inability: Not on file  . Transportation needs:    Medical: No    Non-medical: No  Tobacco Use  . Smoking status: Former Smoker    Packs/day: 1.00    Years: 39.00    Pack years: 39.00    Types: Cigarettes    Start date: 12/21/1978    Last attempt to quit: 10/16/2018    Years since quitting: 0.5  . Smokeless tobacco: Never Used  Substance and Sexual Activity  . Alcohol use: Yes    Alcohol/week: 0.0 standard drinks    Comment: occassional - approx 1 every 2 weeks   . Drug use: No  . Sexual activity: Yes    Birth control/protection: None  Lifestyle  . Physical activity:    Days per week: 0 days    Minutes per session: Not on file  .  Stress: To some extent  Relationships  . Social connections:    Talks on phone: Three times a week    Gets together: Once a week    Attends religious service: 1 to 4 times per year    Active member of club or organization: Not on file    Attends meetings of clubs or organizations: Not on file    Relationship status: Married  . Intimate partner violence:    Fear of current or ex partner: No    Emotionally abused: No    Physically abused: No    Forced sexual activity: No  Other Topics Concern  . Not on file  Social History  Narrative  . Not on file    FAMILY HISTORY: Family History  Problem Relation Age of Onset  . Alzheimer's disease Mother   . Colon cancer Father   . Breast cancer Neg Hx     ALLERGIES:  has No Known Allergies.  MEDICATIONS:  Current Outpatient Medications  Medication Sig Dispense Refill  . atorvastatin (LIPITOR) 80 MG tablet Take 0.5 tablets (40 mg total) by mouth daily at 6 PM. (Patient taking differently: Take 20 mg by mouth 2 (two) times daily. ) 30 tablet 3  . B Complex Vitamins (VITAMIN B COMPLEX PO) Take 1 tablet by mouth daily.     [name] Kitchen CALCIUM PO Take 1 tablet by mouth daily.     . Cholecalciferol (VITAMIN D) 2000 units CAPS Take 1 capsule (2,000 Units total) by mouth daily. 30 capsule   . dexamethasone (DECADRON) 4 MG tablet Take 1 tablet (4 mg total) by mouth daily. 25 tablet 0  . lidocaine-prilocaine (EMLA) cream Apply to affected area once 30 g 3  . metoprolol tartrate (LOPRESSOR) 25 MG tablet Take 12.5 mg by mouth 2 (two) times daily.    [name] Kitchen morphine 20 MG/5ML solution Take 2.5 mLs (10 mg total) by mouth every 4 (four) hours as needed for pain. 100 mL 0  . omeprazole (PRILOSEC) 40 MG capsule Take 1 capsule (40 mg total) by mouth daily. 90 capsule 1  . ondansetron (ZOFRAN) 8 MG tablet Take 1 tablet (8 mg total) by mouth 2 (two) times daily as needed for refractory nausea / vomiting. Start on day 3 after carboplatin chemo. 30 tablet 1  . oxyCODONE (OXYCONTIN) 10 mg 12 hr tablet Take 1 tablet (10 mg total) by mouth every 12 (twelve) hours. 60 tablet 0  . polyethylene glycol (MIRALAX / GLYCOLAX) packet Take 17 g by mouth daily as needed.     . potassium chloride SA (K-DUR,KLOR-CON) 20 MEQ tablet TAKE 1 TABLET(20 MEQ) BY MOUTH DAILY 90 tablet 1  . prochlorperazine (COMPAZINE) 10 MG tablet TAKE 1 TABLET BY MOUTH EVERY 6 HOURS AS NEEDED FOR NAUSEA OR VOMITING 337 tablet 1  . sertraline (ZOLOFT) 50 MG tablet Take 1 tablet (50 mg total) by mouth daily. 30 tablet 1  . albuterol (PROAIR  HFA) 108 (90 Base) MCG/ACT inhaler Inhale 2 puffs into the lungs every 6 (six) hours as needed for wheezing or shortness of breath. (Patient not taking: Reported on 04/24/2019) 18 g 2  . nitroGLYCERIN (NITROSTAT) 0.4 MG SL tablet Place 1 tablet (0.4 mg total) under the tongue every 5 (five) minutes x 3 doses as needed for chest pain. (Patient not taking: Reported on 04/24/2019) 25 tablet 12   No current facility-administered medications for this visit.    Facility-Administered Medications Ordered in Other Visits  Medication Dose Route Frequency Provider Last Rate Last  Dose  . heparin lock flush 100 unit/mL  500 Units Intravenous Once Earlie Server, MD      . sodium chloride flush (NS) 0.9 % injection 10 mL  10 mL Intravenous PRN Earlie Server, MD   10 mL at 01/09/19 0820  . sodium chloride flush (NS) 0.9 % injection 10 mL  10 mL Intravenous PRN Earlie Server, MD   10 mL at 01/10/19 1400     PHYSICAL EXAMINATION: ECOG PERFORMANCE STATUS: 2 - Symptomatic, <50% confined to bed Vitals:   04/24/19 0925  BP: (!) 113/17  Pulse: 87  Resp: 20  Temp: 97.8 F (36.6 C)   Filed Weights   04/24/19 0925  Weight: 152 lb 6.4 oz (69.1 kg)   Physical Exam  Constitutional: She is oriented to person, place, and time. No distress.  HENT:  Head: Normocephalic and atraumatic.  Nose: Nose normal.  Mouth/Throat: Oropharynx is clear and moist. No oropharyngeal exudate.  Eyes: Pupils are equal, round, and reactive to light. EOM are normal. No scleral icterus.  Neck: Normal range of motion. Neck supple.  Cardiovascular: Normal rate and regular rhythm.  No murmur heard. Pulmonary/Chest: Effort normal. No respiratory distress. She has no rales. She exhibits no tenderness.  Decreased breath sounds bilaterally.  Abdominal: Soft. She exhibits no distension. There is no abdominal tenderness.  Musculoskeletal: Normal range of motion.        General: No edema.  Neurological: She is alert and oriented to person, place, and time.  No cranial nerve deficit. She exhibits normal muscle tone. Coordination normal.  Skin: Skin is warm and dry. She is not diaphoretic. No erythema.  Psychiatric: Affect normal.       LABORATORY DATA:  I have reviewed the data as listed Lab Results  Component Value Date   WBC 5.9 04/24/2019   HGB 12.0 04/24/2019   HCT 35.8 (L) 04/24/2019   MCV 91.1 04/24/2019   PLT 143 (L) 04/24/2019   Recent Labs    08/12/18 1438  03/09/19 1236 03/14/19 0916 04/24/19 0853  NA 131*   < > 134* 138 133*  K 3.7   < > 3.7 3.5 3.6  CL 97*   < > 104 105 101  CO2 25   < > 23 26 24   GLUCOSE 122*   < > 100* 93 111*  BUN 9   < > 11 14 17   CREATININE 0.74   < > 0.66 0.73 0.84  CALCIUM 9.1   < > 8.7* 8.8* 8.8*  GFRNONAA >60   < > >60 >60 >60  GFRAA >60   < > >60 >60 >60  PROT 8.0   < > 7.0 6.9 6.7  ALBUMIN 4.3   < > 3.6 3.6 3.4*  AST 33   < > 30 30 36  ALT 26   < > 29 30 37  ALKPHOS 81   < > 133* 113 112  BILITOT 0.6   < > 0.6 0.4 0.5  BILIDIR 0.2  --   --   --   --   IBILI 0.4  --   --   --   --    < > = values in this interval not displayed.    RADIOGRAPHIC STUDIES: I have personally reviewed the radiological images as listed and agreed with the findings in the report. 10/20/2018 CT chest w contrast . Large mediastinal mass involving both hila left much greater than right consistent with lung carcinoma possibly small cell lung carcinoma. This  mass causes narrowing of the left mainstem bronchus with resultant volume loss on the left and mediastinal shift to the Left. 2. Moderate size left pleural effusion. 3. Coronary artery calcifications and moderate thoracic aortic atherosclerosis. PET 10/26/2018 . Large left lung mass is markedly hypermetabolic in the confluent lymphadenopathy in the mediastinum and in bilateral hilar regions also shows marked hypermetabolism. 2. Hypermetabolic metastatic lymphadenopathy in the right neck and supraclavicular region. 3. Hypermetabolic lymphadenopathy in the  hepato duodenal ligament consistent with metastatic disease. 4. Large hypermetabolic lesion posterior right acetabulum without correlate of CT finding. Features highly suspicious for bony metastatic disease. MRI brain 10/28/2018 1. No metastatic disease or acute intracranial abnormality identified. 2. 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  3. Moderately advanced but nonspecific cerebral white matter signal changes, most commonly due to chronic small vessel disease. Lesser signal changes in the deep gray matter and pons may also be small vessel related.  CT chest abdomen pelvis with contrast 12/15/2018 1 Marked response to therapy of thoracic adenopathy. 2. No new or progressive disease in the chest. 3. Coronary artery atherosclerosis. 4. 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 5. No new or progressive disease in the abdomen or pelvis 6. Pelvic floor laxity, cystocele with bladder wall thickening, suggesting a component of outlet obstruction. 7. Possible vague sclerosis within the posterior right acetabulum, in the region of hypermetabolism on prior PET. Likely an area of treated metastasis. 8. Central line tip in right brachiocephalic vein with surrounding occlusive thrombus. CT chest abdomen pelvis with contrast 01/24/2019 1. New extensive gas throughout the bladder wall and within the bladder lumen with underlying mild diffuse bladder wall thickening, compatible with acute emphysematous cystitis. 2. Two new subcentimeter low-attenuation lesions in the right kidney, nonspecific but worrisome for mild acute right pyelonephritis given the above bladder findings. No discrete/drainable renal abscess. No hydronephrosis. 3. Continued positive response to therapy with continued reduction in mediastinal adenopathy. No residual measurable left lung mass.Stable faint sclerosis at the site of the hypermetabolic right acetabular osseous lesion. No new or progressive metastatic disease  in the chest, abdomen or pelvis. 4. Small bowel containing small periumbilical hernia without bowel complication at this time. 5. Aortic Atherosclerosis (ICD10-I70.0) and Emphysema (ICD10-J43.9).  ASSESSMENT & PLAN:  1. Small cell lung cancer ([place])   2. Depression   3. Radiation-induced esophagitis   4. Neoplasm related pain   5. Encounter for antineoplastic immunotherapy    #Extensive small cell lung cancer S/p 4 cycles of Carboplatin, Etoposide and Tecentriq.   Status post chest and whole brain radiation. Labs are reviewed and discussed with patient. Counts acceptable to resume on Tecentriq treatment every 3 weeks. Last CT was done in February 2020. I will obtain another CT chest abdomen pelvis to evaluate disease status.  #Mild hyponatremia, chronic.  Continue to monitor.  #Radiation induced esophagitis, continue Carafate 1 g 3 times a day.  Continue omeprazole 40 mg daily.  #Neoplasm related pain, continue OxyContin 10 mg twice daily.  Patient prefers to try oral morphine liquid which she has at home as needed to see if oral liquid morphine is good as an option of short acting narcotics for her. #Depression/anxiety, continue sertraline 50 mg daily. Continue follow-up with palliative care [facility]. No additional chest pain episodes.  The patient knows to call the clinic with any problems questions or concerns.  Earlie Server, MD, PhD Hematology Oncology [facility] at [facility] Pager- [phone] 03/14/2019

## 2019-04-24 NOTE — Progress Notes (Signed)
Nutrition Follow-up:  RD working remotely.  Patient with lung cancer followed by Dr. Tasia Catchings.  Patient has completed chemo and radiation.  Patient currently on tecentriq.    Called patient for nutrition follow-up.  Patient reports that she is eating still about 2 meals per day.  Sometimes snacks on ice cream.  Reports that she is drinking premier protein shake at times but has not drank any in the past week.  Reports some issues with nausea but controlled with medication.   Patient unable to stay on phone very long.  Reports she needs to go.     Medications: reviewed  Labs: reviewed  Anthropometrics:   Weight increased to 152 lb 6.4 oz today from 148 lb on 2/10.    Patient reports she does not want to gain any more weight.  "I feel good at this weight."  NUTRITION DIAGNOSIS: Inadequate oral intake improving with weight gain   INTERVENTION:  Encouraged patient to continue with oral nutrition supplements.  Does not like ensure/boost shakes. Encouraged continued high calorie, high protein foods for weight maintenance. Patient to reach out to RD if needed in the future for follow-up.    MONITORING, EVALUATION, GOAL: Patient will consume adequate calories and protein to meet nutritional needs and maintain weight   NEXT VISIT: patient to contact RD   Priyana Mccarey B. Zenia Resides, [place], [place] Registered Dietitian [phone] (pager)

## 2019-04-25 ENCOUNTER — Inpatient Hospital Stay (HOSPITAL_BASED_OUTPATIENT_CLINIC_OR_DEPARTMENT_OTHER): Payer: Medicare Other | Admitting: Hospice and Palliative Medicine

## 2019-04-25 ENCOUNTER — Telehealth: Payer: Self-pay | Admitting: *Deleted

## 2019-04-25 ENCOUNTER — Encounter: Payer: Self-pay | Admitting: Hospice and Palliative Medicine

## 2019-04-25 DIAGNOSIS — F329 Major depressive disorder, single episode, unspecified: Secondary | ICD-10-CM | POA: Diagnosis not present

## 2019-04-25 DIAGNOSIS — Z515 Encounter for palliative care: Secondary | ICD-10-CM | POA: Diagnosis not present

## 2019-04-25 DIAGNOSIS — F32A Depression, unspecified: Secondary | ICD-10-CM

## 2019-04-25 MED ORDER — HYDROCODONE-ACETAMINOPHEN 5-325 MG PO TABS: 1.0000 | ORAL_TABLET | Freq: Four times a day (QID) | ORAL | 0 refills | Status: DC | PRN

## 2019-04-25 MED ORDER — SERTRALINE HCL 50 MG PO TABS: 75.0000 mg | ORAL_TABLET | Freq: Every day | ORAL | 2 refills | Status: DC

## 2019-04-25 NOTE — Telephone Encounter (Signed)
No, she said Dr Tasia Catchings told her in she would refill her Hydrocodone when she saw her yesterday

## 2019-04-25 NOTE — Telephone Encounter (Signed)
Norco is not listed in her medications & Last refill was in feb.  Is she wanting a refill on oxyCODONE (OXYCONTIN) 10 mg 12 hr tablet?

## 2019-04-25 NOTE — Progress Notes (Signed)
Virtual Visit via Telephone Note  I connected with [name] on 04/25/19 at 11:30 AM EDT by telephone and verified that I am speaking with the correct person using two identifiers.   I discussed the limitations, risks, security and privacy concerns of performing an evaluation and management service by telephone and the availability of in person appointments. I also discussed with the patient that there may be a patient responsible charge related to this service. The patient expressed understanding and agreed to proceed.   History of Present Illness: Palliative Care consult requested for this 73 y.o. 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  PMH also notable for CAD with history of acute MI, claustrophobia, hypertension, and hyperlipidemia.  Patient was seen and examined by pulmonology due to worsening exertional dyspnea.  She was found to have a central obstructing mass and mediastinal adenopathy on CT.  Patient is status post bronchoscopy with biopsy confirming small cell lung cancer.  PET CT scan revealed hypermetabolic areas involving lymphadenopathy in the neck and supraclavicular region, hepatoduodenal ligament, and disease of the right acetabulum.  Patient is pending initiation of chemotherapy with Atezolizumab, Carboplatin, and Etoposide.    Patient is status post chemotherapy and whole brain radiation and is now on maintenance Tecentriq. Palliative care was asked to help address goals and symptoms.   Observations/Objective: I spoke with patient by phone.  She is status post 4 cycles of carboplatin, etoposide and Tecentriq.  She is also status post chest and whole brain radiation.    Patient reports that depression and anxiety had been stable on 50 mg of Zoloft, last dose increase in December.  However, she reports 2 weeks of increased depression after learning that she would require continued Tecentriq.  Patient endorses insomnia  and says that she has had frequent daytime napping.  Patient is interested in trying to increase the dose of Zoloft as she feels that has been incredibly effective in the past.  We discussed sleep hygiene and I encouraged her to limit daytime napping and trying to adhere to a schedule sleep pattern.  We discussed sleep aids including melatonin but patient would like to first work on her sleep pattern.  Assessment and Plan: -Continue current scope of treatment -Increase Zoloft to 75 mg daily (1.5 of 50mg  tablets) -RTC in 2 weeks     I discussed the assessment and treatment plan with the patient. The patient was provided an opportunity to ask questions and all were answered. The patient agreed with the plan and demonstrated an understanding of the instructions.   The patient was advised to call back or seek an in-person evaluation if the symptoms worsen or if the condition fails to improve as anticipated.  I provided 20 minutes minutes of non-face-to-face time during this encounter.   Irean Hong, NP

## 2019-04-25 NOTE — Telephone Encounter (Signed)
Kristi [name], as documented in my last progress note, patient prefers oral morphine liquid as her short acting narcotics.  She wants to try first and if not working, she will let me know and will start Ryan patient and clarify her needs.  She changed her mind and requests to be switched to norco. She understands that morphine liquid should be discontinued and should not be taken together with Norco.  Rx of Norco sent to pharmacy.   Almyra Free, she also requests to reschedule her next appoint of tecentriq. She prefers to visit her daughter after she finishes CT scan and she will come back on 05/18/2019. Please reschedule her chemotherapy appt to after she comes back and confirm the dates with patient. Thank you.

## 2019-04-25 NOTE — Telephone Encounter (Signed)
Patient called and reports that a prescription for Norco was supposed to have been sent in to pharmacy, but pharmacy does not have it. Please advise

## 2019-04-25 NOTE — Addendum Note (Signed)
Addended by: Earlie Server on: 04/25/2019 05:17 PM   Modules accepted: Orders

## 2019-04-26 NOTE — Telephone Encounter (Signed)
Kristi [name] - please see note with requested change in appts.

## 2019-05-01 ENCOUNTER — Other Ambulatory Visit: Payer: Self-pay | Admitting: *Deleted

## 2019-05-01 ENCOUNTER — Other Ambulatory Visit: Payer: Self-pay

## 2019-05-01 ENCOUNTER — Telehealth: Payer: Self-pay | Admitting: Internal Medicine

## 2019-05-01 ENCOUNTER — Telehealth: Payer: Self-pay | Admitting: *Deleted

## 2019-05-01 DIAGNOSIS — R600 Localized edema: Secondary | ICD-10-CM

## 2019-05-01 DIAGNOSIS — C3490 Malignant neoplasm of unspecified part of unspecified bronchus or lung: Secondary | ICD-10-CM

## 2019-05-01 MED ORDER — TRIAMTERENE-HCTZ 37.5-25 MG PO CAPS: 1.0000 | ORAL_CAPSULE | Freq: Every day | ORAL | 0 refills | Status: DC | PRN

## 2019-05-01 MED ORDER — OXYCODONE HCL ER 10 MG PO T12A: 10.0000 mg | EXTENDED_RELEASE_TABLET | Freq: Two times a day (BID) | ORAL | 0 refills | Status: DC

## 2019-05-01 NOTE — Telephone Encounter (Signed)
I called and spoke with patient by phone. She says she has tolerated the dose increase of her Zoloft and feels like she has improved moods. She just wanted to give [place] an update. She plans to leave on Friday for six days for a trip to [place].

## 2019-05-01 NOTE — Telephone Encounter (Signed)
Refill sent to [facility]. Thank you.

## 2019-05-01 NOTE — Telephone Encounter (Signed)
Patient called stating that Sharion Dove, NP asked her to call him regarding her pain status. Please return call [phone]

## 2019-05-01 NOTE — Telephone Encounter (Signed)
Pt needs refill sent in to [facility] in [place]  triamterene-hydrochlorothiazide (DYAZIDE) 37.5-25 MG capsule [[id number]] DISCONTINUED

## 2019-05-05 ENCOUNTER — Other Ambulatory Visit: Payer: Self-pay

## 2019-05-05 ENCOUNTER — Ambulatory Visit
Admission: RE | Admit: 2019-05-05 | Discharge: 2019-05-05 | Disposition: A | Discharge status: Home / Self Care | Payer: Medicare Other | Source: Ambulatory Visit | Attending: Oncology | Admitting: Oncology

## 2019-05-05 DIAGNOSIS — C3490 Malignant neoplasm of unspecified part of unspecified bronchus or lung: Secondary | ICD-10-CM | POA: Insufficient documentation

## 2019-05-05 MED ORDER — IOPAMIDOL (ISOVUE-300) INJECTION 61%
100.0000 mL | Freq: Once | INTRAVENOUS | Status: AC | PRN
  Administered 2019-05-05: 100 mL via INTRAVENOUS

## 2019-05-16 ENCOUNTER — Other Ambulatory Visit: Payer: Medicare Other

## 2019-05-16 ENCOUNTER — Ambulatory Visit: Payer: Medicare Other

## 2019-05-16 ENCOUNTER — Encounter: Payer: Medicare Other | Admitting: Hospice and Palliative Medicine

## 2019-05-16 ENCOUNTER — Ambulatory Visit: Payer: Medicare Other | Admitting: Oncology

## 2019-05-17 ENCOUNTER — Other Ambulatory Visit: Payer: Self-pay

## 2019-05-18 ENCOUNTER — Encounter: Payer: Self-pay | Admitting: Oncology

## 2019-05-18 ENCOUNTER — Other Ambulatory Visit: Payer: Self-pay

## 2019-05-18 ENCOUNTER — Inpatient Hospital Stay (HOSPITAL_BASED_OUTPATIENT_CLINIC_OR_DEPARTMENT_OTHER): Payer: Medicare Other | Admitting: Oncology

## 2019-05-18 ENCOUNTER — Inpatient Hospital Stay: Payer: Medicare Other

## 2019-05-18 ENCOUNTER — Inpatient Hospital Stay (HOSPITAL_BASED_OUTPATIENT_CLINIC_OR_DEPARTMENT_OTHER): Payer: Medicare Other | Admitting: Hospice and Palliative Medicine

## 2019-05-18 VITALS — BP 110/72 | HR 93 | Temp 98.1°F | Resp 18 | Wt 153.4 lb

## 2019-05-18 DIAGNOSIS — Z5112 Encounter for antineoplastic immunotherapy: Secondary | ICD-10-CM | POA: Diagnosis not present

## 2019-05-18 DIAGNOSIS — C3490 Malignant neoplasm of unspecified part of unspecified bronchus or lung: Secondary | ICD-10-CM | POA: Diagnosis not present

## 2019-05-18 DIAGNOSIS — F329 Major depressive disorder, single episode, unspecified: Secondary | ICD-10-CM

## 2019-05-18 DIAGNOSIS — I10 Essential (primary) hypertension: Secondary | ICD-10-CM

## 2019-05-18 DIAGNOSIS — I2510 Atherosclerotic heart disease of native coronary artery without angina pectoris: Secondary | ICD-10-CM | POA: Diagnosis not present

## 2019-05-18 DIAGNOSIS — F32A Depression, unspecified: Secondary | ICD-10-CM

## 2019-05-18 DIAGNOSIS — E871 Hypo-osmolality and hyponatremia: Secondary | ICD-10-CM | POA: Diagnosis not present

## 2019-05-18 DIAGNOSIS — Z79899 Other long term (current) drug therapy: Secondary | ICD-10-CM | POA: Diagnosis not present

## 2019-05-18 DIAGNOSIS — R21 Rash and other nonspecific skin eruption: Secondary | ICD-10-CM

## 2019-05-18 DIAGNOSIS — Z87891 Personal history of nicotine dependence: Secondary | ICD-10-CM

## 2019-05-18 DIAGNOSIS — I252 Old myocardial infarction: Secondary | ICD-10-CM

## 2019-05-18 DIAGNOSIS — R5383 Other fatigue: Secondary | ICD-10-CM

## 2019-05-18 DIAGNOSIS — Z515 Encounter for palliative care: Secondary | ICD-10-CM

## 2019-05-18 LAB — COMPREHENSIVE METABOLIC PANEL
ALT: 33 U/L (ref 0–44)
AST: 36 U/L (ref 15–41)
Albumin: 3.6 g/dL (ref 3.5–5.0)
Alkaline Phosphatase: 99 U/L (ref 38–126)
Anion gap: 7 (ref 5–15)
BUN: 22 mg/dL (ref 8–23)
CO2: 25 mmol/L (ref 22–32)
Calcium: 8.9 mg/dL (ref 8.9–10.3)
Chloride: 105 mmol/L (ref 98–111)
Creatinine, Ser: 0.72 mg/dL (ref 0.44–1.00)
GFR calc Af Amer: >60 mL/min (ref >60)
GFR calc non Af Amer: >60 mL/min (ref >60)
Glucose, Bld: 121 mg/dL — ABNORMAL HIGH (ref 70–99)
Potassium: 3.7 mmol/L (ref 3.5–5.1)
Sodium: 137 mmol/L (ref 135–145)
Total Bilirubin: 0.4 mg/dL (ref 0.3–1.2)
Total Protein: 6.8 g/dL (ref 6.5–8.1)

## 2019-05-18 LAB — CBC WITH DIFFERENTIAL/PLATELET
Abs Immature Granulocytes: 0.03 10*3/uL (ref 0.00–0.07)
Basophils Absolute: 0 10*3/uL (ref 0.0–0.1)
Basophils Relative: 0 %
Eosinophils Absolute: 0.1 10*3/uL (ref 0.0–0.5)
Eosinophils Relative: 2 %
HCT: 36 % (ref 36.0–46.0)
Hemoglobin: 12 g/dL (ref 12.0–15.0)
Immature Granulocytes: 1 %
Lymphocytes Relative: 21 %
Lymphs Abs: 1.3 10*3/uL (ref 0.7–4.0)
MCH: 29.6 pg (ref 26.0–34.0)
MCHC: 33.3 g/dL (ref 30.0–36.0)
MCV: 88.7 fL (ref 80.0–100.0)
Monocytes Absolute: 0.9 10*3/uL (ref 0.1–1.0)
Monocytes Relative: 14 %
Neutro Abs: 3.9 10*3/uL (ref 1.7–7.7)
Neutrophils Relative %: 62 %
Platelets: 150 10*3/uL (ref 150–400)
RBC: 4.06 MIL/uL (ref 3.87–5.11)
RDW: 13.2 % (ref 11.5–15.5)
WBC: 6.3 10*3/uL (ref 4.0–10.5)
nRBC: 0 % (ref 0.0–0.2)

## 2019-05-18 LAB — TSH: TSH: 1.235 u[IU]/mL (ref 0.350–4.500)

## 2019-05-18 MED ORDER — SODIUM CHLORIDE 0.9 % IV SOLN
1200.0000 mg | Freq: Once | INTRAVENOUS | Status: AC
  Administered 2019-05-18: 1200 mg via INTRAVENOUS
  Filled 2019-05-18: qty 20

## 2019-05-18 MED ORDER — DEXAMETHASONE SODIUM PHOSPHATE 10 MG/ML IJ SOLN
10.0000 mg | Freq: Once | INTRAMUSCULAR | Status: AC
  Administered 2019-05-18: 10 mg via INTRAVENOUS
  Filled 2019-05-18: qty 1

## 2019-05-18 MED ORDER — SODIUM CHLORIDE 0.9 % IV SOLN
Freq: Once | INTRAVENOUS | Status: AC
  Administered 2019-05-18: 10:00:00 via INTRAVENOUS
  Filled 2019-05-18: qty 250

## 2019-05-18 MED ORDER — SODIUM CHLORIDE 0.9% FLUSH
10.0000 mL | INTRAVENOUS | Status: DC | PRN
  Administered 2019-05-18: 09:00:00 10 mL via INTRAVENOUS
  Filled 2019-05-18: qty 10

## 2019-05-18 MED ORDER — HEPARIN SOD (PORK) LOCK FLUSH 100 UNIT/ML IV SOLN
500.0000 [IU] | Freq: Once | INTRAVENOUS | Status: AC
  Administered 2019-05-18: 11:00:00 500 [IU] via INTRAVENOUS
  Filled 2019-05-18: qty 5

## 2019-05-18 MED ORDER — DIPHENHYDRAMINE-ZINC ACETATE 2-0.1 % EX CREA: 1.0000 "application " | TOPICAL_CREAM | Freq: Three times a day (TID) | CUTANEOUS | 0 refills | Status: DC | PRN

## 2019-05-18 NOTE — Progress Notes (Signed)
Patient here for follow up. Complains of bumps to palms of her hands, bumps are itch and red.

## 2019-05-18 NOTE — Progress Notes (Signed)
Hematology/Oncology Follow up note [facility] [phone] [phone]   Patient Care Team: Glean Hess, MD as PCP - General (Internal Medicine) Charolette Forward, MD as Consulting Physician (Cardiology) Telford Nab, RN as Registered Nurse  REFERRING PROVIDER: Dr. Felicie Morn REASON FOR VISIT:  Follow-up for small cell lung cancer,   HISTORY OF PRESENTING ILLNESS:  Kristi DRUCKENMILLER is a  73 y.o.  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 Patient recently was referred to see pulmonology Dr. Felicie Morn for persistent cough and dyspnea.  Patient reports coughing up clear sputum for about 2 months.  No hemoptysis.  She also had weight loss. Former smoker, quitting smoking 2 weeks ago.  39-pack-year smoking history  09/30/2018 chest x-ray showed new market volume loss in the left chest area of the left mainstem bronchus worrisome for centrally obstructing mass. 10/17/2018 chest x-ray showed volume loss worsened in the left hemithorax worsening patchy consolidation in the mid to lower left lung with minimal residual aeration and up in the left lung mass not excluded.  Possible small left pleural effusion. 10/31 2019 CT chest with contrast showed large mediastinal mass involving both hilar, left greater than right, consistent with lung carcinoma The mass causes narrowing of the left mainstem bronchus with resultant volume loss on the left and a mediastinal shift to the left.  Moderate size left pleural effusion. 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  Today patient was accompanied by husband and daughter to clinic to discuss diagnosis and management plan. She reports feeling tired feels shortness of breath with exertion.  Persistent coughing, productive with clear sputum. Also reports difficulty swallowing,  feels food sticking in her food pipe. MRI brain was obtained which was negative for intracranial metastatic disease.   01/24/2019 interim CT scan done which showed continued positive response to therapy with continued reduction in mediastinal adenopathy.  No residual measurable left lung mass. However she developed new extensive gas throughout the bladder wall and within the bladder lumen with underlying mild diffuse bladder wall thickening.  Concerning for acute emphysematous cystitis.  There is also to new subcentimeter low-attenuation lesion in the right kidney.  Nonspecific but worrisome for right pyelonephritis. I instructed patient to go to emergency room for further evaluation.  Patient was admitted for treatment for acute cystitis.  Urology was consulted and evaluate patient during her hospitalization.  Urology did not feel that patient has pyelonephritis and recommend treatment with antibiotics because patient's immunocompromise.  Patient was on IV cephapirin and then transitioned to p.o.cefpodoxime at discharge.  Previous oncology treatment 11/02/2018 -01/09/2023 cycles of carbo/etoposide/Tecentriq Chest and whole brain radiation finished in May 2020 04/25/2019 resume Tecentriq every 3 weeks.  INTERVAL HISTORY Kristi [name] is a 73 y.o. female who has above history reviewed by me today presents for evaluation prior to immunotherapy for treatment of extensive small cell lung cancer. Patient has complete chest and whole brain radiation in May 2020. Patient came back from daughter's home yesterday.  Reports that she had a good time with family. Mood has improved. She also had CT done prior to this visit. Chronic fatigue at baseline. Today she reports no pain.  She takes her long-acting narcotics.  Occasionally needed hydrocodone. Patient developed " bumps" on her palms, itchy.  No exacerbating factors.   Review of Systems  Constitutional: Positive for fatigue. Negative for appetite  change, chills and  fever.  HENT:   Negative for hearing loss and voice change.   Eyes: Negative for eye problems.  Respiratory: Negative for chest tightness, cough and shortness of breath.   Cardiovascular: Negative for chest pain.  Gastrointestinal: Negative for abdominal distention, abdominal pain, blood in stool, diarrhea and nausea.  Endocrine: Negative for hot flashes.  Genitourinary: Negative for difficulty urinating and frequency.   Musculoskeletal: Negative for arthralgias.  Skin: Positive for rash. Negative for itching.  Neurological: Negative for extremity weakness.  Hematological: Negative for adenopathy.  Psychiatric/Behavioral: Negative for confusion and depression. The patient is not nervous/anxious.      MEDICAL HISTORY:  Past Medical History:  Diagnosis Date   Acute MI, inferoposterior wall ([place]) 09/30/2014   Claustrophobia    Coronary artery disease    Hypercholesteremia    Hypertension    MI, old    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 ([place]) 10/27/2018    SURGICAL HISTORY: Past Surgical History:  Procedure Laterality Date   APPENDECTOMY     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   BLADDER NECK SUSPENSION     CHOLECYSTECTOMY N/A 08/14/2018   Procedure: LAPAROSCOPIC CHOLECYSTECTOMY;  Surgeon: Jules Husbands, MD;  Location: [facility] ORS;  Service: General;  Laterality: N/A;   CHOLECYSTECTOMY  11/2018   CORONARY STENT PLACEMENT     ENDOBRONCHIAL ULTRASOUND N/A 10/21/2018   Procedure: ENDOBRONCHIAL ULTRASOUND;  Surgeon: Laverle Hobby, MD;  Location: [facility] ORS;  Service: Pulmonary;  Laterality: N/A;   HERNIA REPAIR     IR FLUORO GUIDE CV LINE RIGHT  12/19/2018   LEFT HEART CATHETERIZATION WITH CORONARY ANGIOGRAM N/A 09/29/2014   Procedure: LEFT HEART CATHETERIZATION WITH CORONARY ANGIOGRAM;  Surgeon: Clent Demark, MD;  Location: [facility];  Service: Cardiovascular;  Laterality: N/A;   PORTA CATH INSERTION N/A 01/02/2019   Procedure: PORTA CATH INSERTION;   Surgeon: Algernon Huxley, MD;  Location: [facility];  Service: Cardiovascular;  Laterality: N/A;   PORTACATH PLACEMENT Right 10/28/2018   Procedure: INSERTION PORT-A-CATH;  Surgeon: Jules Husbands, MD;  Location: [facility] ORS;  Service: General;  Laterality: Right;    SOCIAL HISTORY: Social History   Socioeconomic History   Marital status: Married    Spouse name: [name]    Number of children: 3   Years of education: Not on file   Highest education level: Not on file  Occupational History   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 strain: Not very hard   Food insecurity:    Worry: Not on file    Inability: Not on file   Transportation needs:    Medical: No    Non-medical: No  Tobacco Use   Smoking status: Former Smoker    Packs/day: 1.00    Years: 39.00    Pack years: 39.00    Types: Cigarettes    Start date: 12/21/1978    Last attempt to quit: 10/16/2018    Years since quitting: 0.5   Smokeless tobacco: Never Used  Substance and Sexual Activity   Alcohol use: Yes    Alcohol/week: 0.0 standard drinks    Comment: occassional - approx 1 every 2 weeks    Drug use: No   Sexual activity: Yes    Birth control/protection: None  Lifestyle   Physical activity:    Days per week: 0 days    Minutes per session: Not on file   Stress: To some extent  Relationships  Social connections:    Talks on phone: Three times a week    Gets together: Once a week    Attends religious service: 1 to 4 times per year    Active member of club or organization: Not on file    Attends meetings of clubs or organizations: Not on file    Relationship status: Married   Intimate partner violence:    Fear of current or ex partner: No    Emotionally abused: No    Physically abused: No    Forced sexual activity: No  Other Topics Concern   Not on file  Social History Narrative   Not on file    FAMILY HISTORY: Family History  Problem  Relation Age of Onset   Alzheimer's disease Mother    Colon cancer Father    Breast cancer Neg Hx     ALLERGIES:  has No Known Allergies.  MEDICATIONS:  Current Outpatient Medications  Medication Sig Dispense Refill   albuterol (PROAIR HFA) 108 (90 Base) MCG/ACT inhaler Inhale 2 puffs into the lungs every 6 (six) hours as needed for wheezing or shortness of breath. 18 g 2   atorvastatin (LIPITOR) 80 MG tablet Take 0.5 tablets (40 mg total) by mouth daily at 6 PM. (Patient taking differently: Take 20 mg by mouth 2 (two) times daily. ) 30 tablet 3   B Complex Vitamins (VITAMIN B COMPLEX PO) Take 1 tablet by mouth daily.      CALCIUM PO Take 1 tablet by mouth daily.      Cholecalciferol (VITAMIN D) 2000 units CAPS Take 1 capsule (2,000 Units total) by mouth daily. 30 capsule    dexamethasone (DECADRON) 4 MG tablet Take 1 tablet (4 mg total) by mouth daily. 25 tablet 0   HYDROcodone-acetaminophen (NORCO/VICODIN) 5-325 MG tablet Take 1 tablet by mouth every 6 (six) hours as needed for moderate pain. 30 tablet 0   lidocaine-prilocaine (EMLA) cream Apply to affected area once 30 g 3   metoprolol tartrate (LOPRESSOR) 25 MG tablet Take 12.5 mg by mouth 2 (two) times daily.     nitroGLYCERIN (NITROSTAT) 0.4 MG SL tablet Place 1 tablet (0.4 mg total) under the tongue every 5 (five) minutes x 3 doses as needed for chest pain. 25 tablet 12   omeprazole (PRILOSEC) 40 MG capsule Take 1 capsule (40 mg total) by mouth daily. 90 capsule 1   ondansetron (ZOFRAN) 8 MG tablet Take 1 tablet (8 mg total) by mouth 2 (two) times daily as needed for refractory nausea / vomiting. Start on day 3 after carboplatin chemo. 30 tablet 1   oxyCODONE (OXYCONTIN) 10 mg 12 hr tablet Take 1 tablet (10 mg total) by mouth every 12 (twelve) hours. 60 tablet 0   polyethylene glycol (MIRALAX / GLYCOLAX) packet Take 17 g by mouth daily as needed.      potassium chloride SA (K-DUR,KLOR-CON) 20 MEQ tablet TAKE 1  TABLET(20 MEQ) BY MOUTH DAILY 90 tablet 1   prochlorperazine (COMPAZINE) 10 MG tablet TAKE 1 TABLET BY MOUTH EVERY 6 HOURS AS NEEDED FOR NAUSEA OR VOMITING 270 tablet 1   sertraline (ZOLOFT) 50 MG tablet Take 1.5 tablets (75 mg total) by mouth daily. 45 tablet 2   sucralfate (CARAFATE) 1 g tablet Take 1 g by mouth 4 (four) times daily -  with meals and at bedtime.     triamterene-hydrochlorothiazide (DYAZIDE) 37.5-25 MG capsule Take 1 each (1 capsule total) by mouth daily as needed. 90 capsule 0  No current facility-administered medications for this visit.    Facility-Administered Medications Ordered in Other Visits  Medication Dose Route Frequency Provider Last Rate Last Dose   heparin lock flush 100 unit/mL  500 Units Intravenous Once Earlie Server, MD       heparin lock flush 100 unit/mL  500 Units Intravenous Once Earlie Server, MD       sodium chloride flush (NS) 0.9 % injection 10 mL  10 mL Intravenous PRN Earlie Server, MD   10 mL at 01/09/19 0820   sodium chloride flush (NS) 0.9 % injection 10 mL  10 mL Intravenous PRN Earlie Server, MD   10 mL at 01/10/19 1400   sodium chloride flush (NS) 0.9 % injection 10 mL  10 mL Intravenous PRN Earlie Server, MD   10 mL at 05/18/19 0835     PHYSICAL EXAMINATION: ECOG PERFORMANCE STATUS: 1 - Symptomatic but completely ambulatory Vitals:   05/18/19 0846  BP: 110/72  Pulse: 93  Resp: 18  Temp: 98.1 F (36.7 C)  SpO2: 95%   Filed Weights   05/18/19 0846  Weight: 153 lb 6.4 oz (69.6 kg)   Physical Exam  Constitutional: She is oriented to person, place, and time. No distress.  HENT:  Head: Normocephalic and atraumatic.  Nose: Nose normal.  Mouth/Throat: Oropharynx is clear and moist. No oropharyngeal exudate.  Eyes: Pupils are equal, round, and reactive to light. EOM are normal. No scleral icterus.  Neck: Normal range of motion. Neck supple.  Cardiovascular: Normal rate and regular rhythm.  No murmur heard. Pulmonary/Chest: Effort normal. No  respiratory distress. She has no rales. She exhibits no tenderness.  Decreased breath sounds bilaterally.  Abdominal: Soft. She exhibits no distension. There is no abdominal tenderness.  Musculoskeletal: Normal range of motion.        General: No edema.  Neurological: She is alert and oriented to person, place, and time. No cranial nerve deficit. She exhibits normal muscle tone. Coordination normal.  Skin: Skin is warm and dry. She is not diaphoretic. No erythema.  A few small raised bumps on bilateral palms.  Psychiatric: Affect normal.       LABORATORY DATA:  I have reviewed the data as listed Lab Results  Component Value Date   WBC 6.3 05/18/2019   HGB 12.0 05/18/2019   HCT 36.0 05/18/2019   MCV 88.7 05/18/2019   PLT 150 05/18/2019   Recent Labs    08/12/18 1438  03/14/19 0916 04/24/19 0853 05/18/19 0828  NA 131*   < > 138 133* 137  K 3.7   < > 3.5 3.6 3.7  CL 97*   < > 105 101 105  CO2 25   < > 26 24 25   GLUCOSE 122*   < > 93 111* 121*  BUN 9   < > 14 17 22   CREATININE 0.74   < > 0.73 0.84 0.72  CALCIUM 9.1   < > 8.8* 8.8* 8.9  GFRNONAA >60   < > >60 >60 >60  GFRAA >60   < > >60 >60 >60  PROT 8.0   < > 6.9 6.7 6.8  ALBUMIN 4.3   < > 3.6 3.4* 3.6  AST 33   < > 30 36 36  ALT 26   < > 30 37 33  ALKPHOS 81   < > 113 112 99  BILITOT 0.6   < > 0.4 0.5 0.4  BILIDIR 0.2  --   --   --   --  IBILI 0.4  --   --   --   --    < > = values in this interval not displayed.    RADIOGRAPHIC STUDIES: I have personally reviewed the radiological images as listed and agreed with the findings in the report. 10/20/2018 CT chest w contrast . Large mediastinal mass involving both hila left much greater than right consistent with lung carcinoma possibly small cell lung carcinoma. This mass causes narrowing of the left mainstem bronchus with resultant volume loss on the left and mediastinal shift to the Left. 2. Moderate size left pleural effusion. 3. Coronary artery calcifications and  moderate thoracic aortic atherosclerosis. PET 10/26/2018 . Large left lung mass is markedly hypermetabolic in the confluent lymphadenopathy in the mediastinum and in bilateral hilar regions also shows marked hypermetabolism. 2. Hypermetabolic metastatic lymphadenopathy in the right neck and supraclavicular region. 3. Hypermetabolic lymphadenopathy in the hepato duodenal ligament consistent with metastatic disease. 4. Large hypermetabolic lesion posterior right acetabulum without correlate of CT finding. Features highly suspicious for bony metastatic disease. MRI brain 10/28/2018 1. No metastatic disease or acute intracranial abnormality identified. 2. 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  3. Moderately advanced but nonspecific cerebral white matter signal changes, most commonly due to chronic small vessel disease. Lesser signal changes in the deep gray matter and pons may also be small vessel related.  CT chest abdomen pelvis with contrast 12/15/2018 1 Marked response to therapy of thoracic adenopathy. 2. No new or progressive disease in the chest. 3. Coronary artery atherosclerosis. 4. 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 5. No new or progressive disease in the abdomen or pelvis 6. Pelvic floor laxity, cystocele with bladder wall thickening, suggesting a component of outlet obstruction. 7. Possible vague sclerosis within the posterior right acetabulum, in the region of hypermetabolism on prior PET. Likely an area of treated metastasis. 8. Central line tip in right brachiocephalic vein with surrounding occlusive thrombus. CT chest abdomen pelvis with contrast 01/24/2019 1. New extensive gas throughout the bladder wall and within the bladder lumen with underlying mild diffuse bladder wall thickening, compatible with acute emphysematous cystitis. 2. Two new subcentimeter low-attenuation lesions in the right kidney, nonspecific but worrisome for mild acute  right pyelonephritis given the above bladder findings. No discrete/drainable renal abscess. No hydronephrosis. 3. Continued positive response to therapy with continued reduction in mediastinal adenopathy. No residual measurable left lung mass.Stable faint sclerosis at the site of the hypermetabolic right acetabular osseous lesion. No new or progressive metastatic disease in the chest, abdomen or pelvis. 4. Small bowel containing small periumbilical hernia without bowel complication at this time. 5. Aortic Atherosclerosis (ICD10-I70.0) and Emphysema (ICD10-J43.9).  05/05/2019 CT chest abdomen pelvis with contrast Near complete resolution of prior mediastinal lymphadenopathy.  Residual 12 mm short axis subcarinal node. Radiation changes in the lungs bilaterally.  Left lower lobe/lingular scarring.  No suspicious pulmonary nodules.  No evidence of metastatic disease in the abdomen or pelvis.   ASSESSMENT & PLAN:  1. Small cell lung cancer ([place])   2. Encounter for antineoplastic immunotherapy   3. Depression, unspecified depression type   4. Rash    #Extensive small cell lung cancer S/p 4 cycles of Carboplatin, Etoposide and Tecentriq.   Status post chest and whole brain radiation. Labs are reviewed and discussed with patient Counts acceptable to proceed with Tecentriq every 3 weeks treatment today.  #Depression, mood has improved.  Continue Zoloft 75 mg daily. # Itchy rash on palm, dermatitis vs immunotherapy skin  rash. Recommend topical benadryl cream. If no improvement, she may try benadryl orally  Continue follow-up with palliative care [facility]. No additional chest pain episodes. Follow up in 3 weeks for eval with Dr.Brahmanday and next cycle of tecentriq.  The patient knows to call the clinic with any problems questions or concerns.  Earlie Server, MD, PhD

## 2019-05-18 NOTE — Progress Notes (Signed)
[place]  [phone] [phone]   Name: Kristi [name] [facility] Date: 05/18/2019 MRN: [id number]  DOB: 19-Sep-1946  Patient Care Team: Glean Hess, MD as PCP - General (Internal Medicine) Charolette Forward, MD as Consulting Physician (Cardiology) Telford Nab, RN as Registered Nurse    REASON FOR CONSULTATION: Palliative Care consult requested for this 73 y.o. female with multiple medical problems including stage IV small cell lung cancer.  PMH also notable for CAD with history of acute MI, claustrophobia, hypertension, and hyperlipidemia.  Patient was recently seen and examined by pulmonology due to worsening exertional dyspnea.  She was found to have a central obstructing mass and mediastinal adenopathy on CT.  Patient is status post bronchoscopy with biopsy confirming small cell lung cancer.  PET CT scan revealed hypermetabolic areas involving lymphadenopathy in the neck and supraclavicular region, hepatoduodenal ligament, and disease of the right acetabulum.  Patient is pending initiation of chemotherapy with Atezolizumab, Carboplatin, and Etoposide.  Additionally she is being considered for palliative XRT of the chest mass to help with dysphagia.  Palliative care was asked to help address goals and symptoms.   SOCIAL HISTORY:    Patient is married and lives at home with her husband of 31 years.  They moved here from [place], [place] around 4 years ago.  Patient has 2 sons and a daughter.  One son lives in [place] and the other 2 children are in [place].  Patient used to work for a [employer] and then later for the local [employer].  ADVANCE DIRECTIVES:  Not on file  CODE STATUS: DNR (MOST form completed on 11/25/19)  PAST MEDICAL HISTORY: Past Medical History:  Diagnosis Date   Acute MI, inferoposterior wall ([place]) 09/30/2014   Claustrophobia    Coronary artery disease     Hypercholesteremia    Hypertension    MI, old    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 ([place]) 10/27/2018    PAST SURGICAL HISTORY:  Past Surgical History:  Procedure Laterality Date   APPENDECTOMY     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   BLADDER NECK SUSPENSION     CHOLECYSTECTOMY N/A 08/14/2018   Procedure: LAPAROSCOPIC CHOLECYSTECTOMY;  Surgeon: Jules Husbands, MD;  Location: [facility] ORS;  Service: General;  Laterality: N/A;   CHOLECYSTECTOMY  11/2018   CORONARY STENT PLACEMENT     ENDOBRONCHIAL ULTRASOUND N/A 10/21/2018   Procedure: ENDOBRONCHIAL ULTRASOUND;  Surgeon: Laverle Hobby, MD;  Location: [facility] ORS;  Service: Pulmonary;  Laterality: N/A;   HERNIA REPAIR     IR FLUORO GUIDE CV LINE RIGHT  12/19/2018   LEFT HEART CATHETERIZATION WITH CORONARY ANGIOGRAM N/A 09/29/2014   Procedure: LEFT HEART CATHETERIZATION WITH CORONARY ANGIOGRAM;  Surgeon: Clent Demark, MD;  Location: [facility] CATH LAB;  Service: Cardiovascular;  Laterality: N/A;   PORTA CATH INSERTION N/A 01/02/2019   Procedure: PORTA CATH INSERTION;  Surgeon: Algernon Huxley, MD;  Location: [facility] CV [facility];  Service: Cardiovascular;  Laterality: N/A;   PORTACATH PLACEMENT Right 10/28/2018   Procedure: INSERTION PORT-A-CATH;  Surgeon: Jules Husbands, MD;  Location: [facility] ORS;  Service: General;  Laterality: Right;    HEMATOLOGY/ONCOLOGY HISTORY:    Small cell lung cancer ([place])   10/27/2018 Initial Diagnosis    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 [facility] [place] [facility])    11/02/2018 -  Chemotherapy    The patient had palonosetron (ALOXI) injection 0.25 mg, 0.25 mg, Intravenous,  Once, 4 of  4 cycles Administration: 0.25 mg (11/02/2018), 0.25 mg (11/23/2018), 0.25 mg (12/19/2018), 0.25 mg (01/09/2019) pegfilgrastim-cbqv (UDENYCA) injection 6 mg, 6 mg, Subcutaneous, Once, 4 of 4 cycles Administration: 6 mg (11/07/2018), 6 mg (11/28/2018), 6 mg (12/23/2018), 6 mg (01/12/2019) CARBOplatin (PARAPLATIN) 390 mg in sodium chloride 0.9 % 250 mL chemo  infusion, 390 mg (100 % of original dose 394.5 mg), Intravenous,  Once, 4 of 4 cycles Dose modification:   (original dose 394.5 mg, Cycle 1) Administration: 390 mg (11/02/2018), 390 mg (11/23/2018), 390 mg (12/19/2018), 390 mg (01/09/2019) etoposide (VEPESID) 180 mg in sodium chloride 0.9 % 500 mL chemo infusion, 100 mg/m2 = 180 mg, Intravenous,  Once, 4 of 4 cycles Administration: 180 mg (11/02/2018), 180 mg (11/03/2018), 180 mg (11/04/2018), 180 mg (11/23/2018), 180 mg (11/24/2018), 180 mg (11/25/2018), 180 mg (12/19/2018), 180 mg (12/20/2018), 180 mg (12/22/2018), 180 mg (01/09/2019), 180 mg (01/10/2019), 180 mg (01/11/2019) fosaprepitant (EMEND) 150 mg, dexamethasone (DECADRON) 12 mg in sodium chloride 0.9 % 145 mL IVPB, , Intravenous,  Once, 4 of 4 cycles Administration:  (11/02/2018),  (11/23/2018),  (12/19/2018),  (01/09/2019) atezolizumab (TECENTRIQ) 1,200 mg in sodium chloride 0.9 % 250 mL chemo infusion, 1,200 mg, Intravenous, Once, 5 of 7 cycles Administration: 1,200 mg (11/23/2018), 1,200 mg (12/19/2018), 1,200 mg (01/09/2019), 1,200 mg (04/24/2019), 1,200 mg (05/18/2019)  for chemotherapy treatment.      ALLERGIES:  has No Known Allergies.  MEDICATIONS:  Current Outpatient Medications  Medication Sig Dispense Refill   albuterol (PROAIR HFA) 108 (90 Base) MCG/ACT inhaler Inhale 2 puffs into the lungs every 6 (six) hours as needed for wheezing or shortness of breath. 18 g 2   atorvastatin (LIPITOR) 80 MG tablet Take 0.5 tablets (40 mg total) by mouth daily at 6 PM. (Patient taking differently: Take 20 mg by mouth 2 (two) times daily. ) 30 tablet 3   B Complex Vitamins (VITAMIN B COMPLEX PO) Take 1 tablet by mouth daily.      CALCIUM PO Take 1 tablet by mouth daily.      Cholecalciferol (VITAMIN D) 2000 units CAPS Take 1 capsule (2,000 Units total) by mouth daily. 30 capsule    dexamethasone (DECADRON) 4 MG tablet Take 1 tablet (4 mg total) by mouth daily. 25 tablet 0   diphenhydrAMINE-zinc  acetate (BENADRYL EXTRA STRENGTH) cream Apply 1 application topically 3 (three) times daily as needed for itching. 28.4 g 0   HYDROcodone-acetaminophen (NORCO/VICODIN) 5-325 MG tablet Take 1 tablet by mouth every 6 (six) hours as needed for moderate pain. 30 tablet 0   lidocaine-prilocaine (EMLA) cream Apply to affected area once 30 g 3   metoprolol tartrate (LOPRESSOR) 25 MG tablet Take 12.5 mg by mouth 2 (two) times daily.     nitroGLYCERIN (NITROSTAT) 0.4 MG SL tablet Place 1 tablet (0.4 mg total) under the tongue every 5 (five) minutes x 3 doses as needed for chest pain. 25 tablet 12   omeprazole (PRILOSEC) 40 MG capsule Take 1 capsule (40 mg total) by mouth daily. 90 capsule 1   ondansetron (ZOFRAN) 8 MG tablet Take 1 tablet (8 mg total) by mouth 2 (two) times daily as needed for refractory nausea / vomiting. Start on day 3 after carboplatin chemo. 30 tablet 1   oxyCODONE (OXYCONTIN) 10 mg 12 hr tablet Take 1 tablet (10 mg total) by mouth every 12 (twelve) hours. 60 tablet 0   polyethylene glycol (MIRALAX / GLYCOLAX) packet Take 17 g by mouth daily as needed.  potassium chloride SA (K-DUR,KLOR-CON) 20 MEQ tablet TAKE 1 TABLET(20 MEQ) BY MOUTH DAILY 90 tablet 1   prochlorperazine (COMPAZINE) 10 MG tablet TAKE 1 TABLET BY MOUTH EVERY 6 HOURS AS NEEDED FOR NAUSEA OR VOMITING 270 tablet 1   sertraline (ZOLOFT) 50 MG tablet Take 1.5 tablets (75 mg total) by mouth daily. 45 tablet 2   sucralfate (CARAFATE) 1 g tablet Take 1 g by mouth 4 (four) times daily -  with meals and at bedtime.     triamterene-hydrochlorothiazide (DYAZIDE) 37.5-25 MG capsule Take 1 each (1 capsule total) by mouth daily as needed. 90 capsule 0   No current facility-administered medications for this visit.    Facility-Administered Medications Ordered in Other Visits  Medication Dose Route Frequency Provider Last Rate Last Dose   heparin lock flush 100 unit/mL  500 Units Intravenous Once Earlie Server, MD        sodium chloride flush (NS) 0.9 % injection 10 mL  10 mL Intravenous PRN Earlie Server, MD   10 mL at 01/09/19 0820   sodium chloride flush (NS) 0.9 % injection 10 mL  10 mL Intravenous PRN Earlie Server, MD   10 mL at 01/10/19 1400   sodium chloride flush (NS) 0.9 % injection 10 mL  10 mL Intravenous PRN Earlie Server, MD   10 mL at 05/18/19 0835    VITAL SIGNS: There were no vitals taken for this visit. There were no vitals filed for this visit.  Estimated body mass index is 25.53 kg/m as calculated from the following:   Height as of 04/24/19: 5\' 5"  (1.651 m).   Weight as of an earlier encounter on 05/18/19: 153 lb 6.4 oz (69.6 kg).  LABS: CBC:    Component Value Date/Time   WBC 6.3 05/18/2019 0828   HGB 12.0 05/18/2019 0828   HGB 14.5 09/19/2018 1038   HCT 36.0 05/18/2019 0828   HCT 42.7 09/19/2018 1038   PLT 150 05/18/2019 0828   PLT 265 09/19/2018 1038   MCV 88.7 05/18/2019 0828   MCV 91 09/19/2018 1038   NEUTROABS 3.9 05/18/2019 0828   NEUTROABS 5.1 09/19/2018 1038   LYMPHSABS 1.3 05/18/2019 0828   LYMPHSABS 2.0 09/19/2018 1038   MONOABS 0.9 05/18/2019 0828   EOSABS 0.1 05/18/2019 0828   EOSABS 0.3 09/19/2018 1038   BASOSABS 0.0 05/18/2019 0828   BASOSABS 0.1 09/19/2018 1038   Comprehensive Metabolic Panel:    Component Value Date/Time   NA 137 05/18/2019 0828   NA 136 09/19/2018 1038   K 3.7 05/18/2019 0828   CL 105 05/18/2019 0828   CO2 25 05/18/2019 0828   BUN 22 05/18/2019 0828   BUN 6 (L) 09/19/2018 1038   CREATININE 0.72 05/18/2019 0828   GLUCOSE 121 (H) 05/18/2019 0828   CALCIUM 8.9 05/18/2019 0828   AST 36 05/18/2019 0828   ALT 33 05/18/2019 0828   ALKPHOS 99 05/18/2019 0828   BILITOT 0.4 05/18/2019 0828   BILITOT 0.5 09/19/2018 1038   PROT 6.8 05/18/2019 0828   PROT 7.4 09/19/2018 1038   ALBUMIN 3.6 05/18/2019 0828   ALBUMIN 4.3 09/19/2018 1038    RADIOGRAPHIC STUDIES: Ct Chest W Contrast  Result Date: 05/05/2019 CLINICAL DATA:  Lung cancer, status post  radiation EXAM: CT CHEST, ABDOMEN, AND PELVIS WITH CONTRAST TECHNIQUE: Multidetector CT imaging of the chest, abdomen and pelvis was performed following the standard protocol during bolus administration of intravenous contrast. CONTRAST:  166mL ISOVUE-300 IOPAMIDOL (ISOVUE-300) INJECTION 61% COMPARISON:  None. FINDINGS:  CT CHEST FINDINGS Cardiovascular: Heart is normal in size.  No pericardial effusion. No evidence of thoracic aortic aneurysm. Atherosclerotic calcifications of the aortic root/arch. Three vessel coronary atherosclerosis. Right chest port terminates cavoatrial junction. Mediastinum/Nodes: Small mediastinal lymph nodes, measuring up to 12 mm in the subcarinal region, previously a 2.1 cm aggregate nodal mass. 6 mm short axis right paratracheal node, previously 10 mm. Visualized thyroid is unremarkable. Lungs/Pleura: Mild scarring in the anterior left lower lobe/lingula. Mild scarring/atelectasis inferiorly in the right middle lobe. No suspicious pulmonary nodules. Calcified granuloma in the right upper lobe (series 6/image 26), benign. Radiation changes in the medial right upper lobe/paramediastinal region (series 6/image 47) and posteromedial left lower lobe (series 6/image 64), progressive. Mild dependent atelectasis in the right lower lobe. Mild centrilobular and paraseptal emphysematous changes, upper lobe predominant. Musculoskeletal: Mild degenerative changes of the thoracic spine. CT ABDOMEN PELVIS FINDINGS Hepatobiliary: Liver is within normal limits. No suspicious enhancing hepatic lesions. Status post cholecystectomy. No intrahepatic or extrahepatic ductal dilatation. Pancreas: Within normal limits. Spleen: Within normal limits. Adrenals/Urinary Tract: Adrenal glands are within normal limits. Kidneys are within normal limits.  No hydronephrosis. Mildly thick-walled bladder with trace nondependent gas, improved. Stomach/Bowel: Stomach is within normal limits. No evidence of bowel obstruction.  Appendix is not discretely visualized. Left colon is decompressed. Mild sigmoid diverticulosis, without evidence of diverticulitis. Vascular/Lymphatic: No evidence of abdominal aortic aneurysm. Atherosclerotic calcifications of the abdominal aorta and branch vessels. No suspicious abdominopelvic lymphadenopathy. Reproductive: Uterus is within normal limits. Bilateral ovaries are within normal limits. Other: No abdominopelvic ascites. Periumbilical hernia containing a small loop of nondilated small bowel (series 2/image 85), unchanged. Musculoskeletal: Visualized osseous structures are within normal limits. IMPRESSION: Near complete resolution of prior mediastinal lymphadenopathy. Residual 12 mm short axis subcarinal node. Radiation changes in the lungs bilaterally. Left lower lobe/lingular scarring. No suspicious pulmonary nodules. No evidence of metastatic disease in the abdomen/pelvis. Resolution of prior emphysematous cystitis and right pyelonephritis. Trace residual nondependent gas in the bladder Electronically Signed   By: Julian Hy M.D.   On: 05/05/2019 14:00   Ct Abdomen Pelvis W Contrast  Result Date: 05/05/2019 CLINICAL DATA:  Lung cancer, status post radiation EXAM: CT CHEST, ABDOMEN, AND PELVIS WITH CONTRAST TECHNIQUE: Multidetector CT imaging of the chest, abdomen and pelvis was performed following the standard protocol during bolus administration of intravenous contrast. CONTRAST:  151mL ISOVUE-300 IOPAMIDOL (ISOVUE-300) INJECTION 61% COMPARISON:  None. FINDINGS: CT CHEST FINDINGS Cardiovascular: Heart is normal in size.  No pericardial effusion. No evidence of thoracic aortic aneurysm. Atherosclerotic calcifications of the aortic root/arch. Three vessel coronary atherosclerosis. Right chest port terminates cavoatrial junction. Mediastinum/Nodes: Small mediastinal lymph nodes, measuring up to 12 mm in the subcarinal region, previously a 2.1 cm aggregate nodal mass. 6 mm short axis right  paratracheal node, previously 10 mm. Visualized thyroid is unremarkable. Lungs/Pleura: Mild scarring in the anterior left lower lobe/lingula. Mild scarring/atelectasis inferiorly in the right middle lobe. No suspicious pulmonary nodules. Calcified granuloma in the right upper lobe (series 6/image 26), benign. Radiation changes in the medial right upper lobe/paramediastinal region (series 6/image 47) and posteromedial left lower lobe (series 6/image 64), progressive. Mild dependent atelectasis in the right lower lobe. Mild centrilobular and paraseptal emphysematous changes, upper lobe predominant. Musculoskeletal: Mild degenerative changes of the thoracic spine. CT ABDOMEN PELVIS FINDINGS Hepatobiliary: Liver is within normal limits. No suspicious enhancing hepatic lesions. Status post cholecystectomy. No intrahepatic or extrahepatic ductal dilatation. Pancreas: Within normal limits. Spleen: Within normal limits. Adrenals/Urinary Tract:  Adrenal glands are within normal limits. Kidneys are within normal limits.  No hydronephrosis. Mildly thick-walled bladder with trace nondependent gas, improved. Stomach/Bowel: Stomach is within normal limits. No evidence of bowel obstruction. Appendix is not discretely visualized. Left colon is decompressed. Mild sigmoid diverticulosis, without evidence of diverticulitis. Vascular/Lymphatic: No evidence of abdominal aortic aneurysm. Atherosclerotic calcifications of the abdominal aorta and branch vessels. No suspicious abdominopelvic lymphadenopathy. Reproductive: Uterus is within normal limits. Bilateral ovaries are within normal limits. Other: No abdominopelvic ascites. Periumbilical hernia containing a small loop of nondilated small bowel (series 2/image 85), unchanged. Musculoskeletal: Visualized osseous structures are within normal limits. IMPRESSION: Near complete resolution of prior mediastinal lymphadenopathy. Residual 12 mm short axis subcarinal node. Radiation changes in  the lungs bilaterally. Left lower lobe/lingular scarring. No suspicious pulmonary nodules. No evidence of metastatic disease in the abdomen/pelvis. Resolution of prior emphysematous cystitis and right pyelonephritis. Trace residual nondependent gas in the bladder Electronically Signed   By: Julian Hy M.D.   On: 05/05/2019 14:00    PERFORMANCE STATUS (ECOG) : 1 - Symptomatic but completely ambulatory  Review of Systems Unless otherwise noted, a complete review of systems is negative.  Physical Exam General: NAD, frail appearing, thin Cardiovascular: regular rate and rhythm Pulmonary: clear ant fields Abdomen: soft, nontender, + bowel sounds GU: no suprapubic tenderness Extremities: no edema, no joint deformities Skin: no rashes Neurological: Weakness but otherwise nonfocal  IMPRESSION: Follow up visit today. Patient was seen while she was in infusion.  Patient reports doing significantly better with improved moods following dose increase of Zoloft to 75 mg daily.  She has tolerated this dose increase well without any noted adverse effects.  Patient denies any SI/HI.  She has been trying to be more active taking daily walks.  She also reports sleeping better.  We discussed her coping strategies.  There have been times where the weight of her cancer and prognosis have felt consuming.  She enjoys spending time with family and recently took a trip to [place] to see her sisters.  PLAN: Continue supportive care Continue Zoloft 75 mg daily RTC in 3 weeks   Patient expressed understanding and was in agreement with this plan. She also understands that She can call the clinic at any time with any questions, concerns, or complaints.     Time Total: 15 minutes  Visit consisted of counseling and education dealing with the complex and emotionally intense issues of symptom management and palliative care in the setting of serious and potentially life-threatening illness.Greater than 50%  of  this time was spent counseling and coordinating care related to the above assessment and plan.  Signed by: Altha Harm, PhD, NP-C [phone] (Work Cell)

## 2019-05-23 ENCOUNTER — Encounter: Payer: Self-pay | Admitting: Oncology

## 2019-05-26 ENCOUNTER — Other Ambulatory Visit: Payer: Self-pay

## 2019-05-26 ENCOUNTER — Ambulatory Visit
Admission: RE | Admit: 2019-05-26 | Discharge: 2019-05-26 | Disposition: A | Discharge status: Home / Self Care | Payer: Medicare Other | Source: Ambulatory Visit | Attending: Radiation Oncology | Admitting: Radiation Oncology

## 2019-05-26 ENCOUNTER — Encounter: Payer: Self-pay | Admitting: Radiation Oncology

## 2019-05-26 VITALS — BP 109/82 | HR 90 | Temp 98.0°F | Resp 18 | Wt 151.2 lb

## 2019-05-26 DIAGNOSIS — Z923 Personal history of irradiation: Secondary | ICD-10-CM | POA: Insufficient documentation

## 2019-05-26 DIAGNOSIS — C3412 Malignant neoplasm of upper lobe, left bronchus or lung: Secondary | ICD-10-CM | POA: Insufficient documentation

## 2019-05-26 DIAGNOSIS — C3490 Malignant neoplasm of unspecified part of unspecified bronchus or lung: Secondary | ICD-10-CM

## 2019-05-26 NOTE — Progress Notes (Signed)
Radiation Oncology Follow up Note  Name: Kristi [name] [facility]   Date:   05/26/2019 MRN:  [id number] DOB: Aug 23, 1946    This 73 y.o. female presents to the clinic today for 1 month follow-up status post PCI for extensive stage small cell lung cancer and patient previously treated with chemotherapy as well as palliative radiation therapy to her chest.  REFERRING PROVIDER: Glean Hess, MD  HPI: Patient is a 73 year old female now at 1 month having completed PCI for extensive stage small cell lung cancer.  She also received palliative radiation therapy to her chest after chemotherapy showing excellent response.  She is seen today in follow-up and is doing well she specifically denies any change as far as her neurologic status headaches cough hemoptysis or chest tightness..  She recently had a CT scan showing excellent response in the chest.  She continues on Tecentriq.    Without significant side effect.  COMPLICATIONS OF TREATMENT: none  FOLLOW UP COMPLIANCE: keeps appointments   PHYSICAL EXAM:  BP 109/82   Pulse 90   Temp 98 F (36.7 C)   Resp 18   Wt 151 lb 3.8 oz (68.6 kg)   BMI 25.17 kg/m  Well-developed well-nourished patient in NAD. HEENT reveals PERLA, EOMI, discs not visualized.  Oral cavity is clear. No oral mucosal lesions are identified. Neck is clear without evidence of cervical or supraclavicular adenopathy. Lungs are clear to A&P. Cardiac examination is essentially unremarkable with regular rate and rhythm without murmur rub or thrill. Abdomen is benign with no organomegaly or masses noted. Motor sensory and DTR levels are equal and symmetric in the upper and lower extremities. Cranial nerves II through XII are grossly intact. Proprioception is intact. No peripheral adenopathy or edema is identified. No motor or sensory levels are noted. Crude visual fields are within normal range.  RADIOLOGY RESULTS: Recent CT scan reviewed and compatible with above-stated findings  PLAN:  Present time she is doing well currently on immunotherapy with Tecentriq.    At this time I see no further role for radiation therapy although would happy to reevaluate the patient in any time should further treatment be indicated.  I have set up a follow-up appointment in approximately 4 months with the patient.  She continues close follow-up care and treatment with medical oncology.  I would like to take this opportunity to thank you for allowing me to participate in the care of your patient.[name], MD

## 2019-06-05 ENCOUNTER — Other Ambulatory Visit: Payer: Self-pay | Admitting: *Deleted

## 2019-06-05 DIAGNOSIS — C3490 Malignant neoplasm of unspecified part of unspecified bronchus or lung: Secondary | ICD-10-CM

## 2019-06-05 MED ORDER — OXYCODONE HCL ER 10 MG PO T12A: 10.0000 mg | EXTENDED_RELEASE_TABLET | Freq: Two times a day (BID) | ORAL | 0 refills | Status: DC

## 2019-06-08 ENCOUNTER — Inpatient Hospital Stay (HOSPITAL_BASED_OUTPATIENT_CLINIC_OR_DEPARTMENT_OTHER): Payer: Medicare Other | Admitting: Internal Medicine

## 2019-06-08 ENCOUNTER — Other Ambulatory Visit: Payer: Self-pay

## 2019-06-08 ENCOUNTER — Inpatient Hospital Stay: Payer: Medicare Other | Admitting: Hospice and Palliative Medicine

## 2019-06-08 ENCOUNTER — Inpatient Hospital Stay: Payer: Medicare Other | Attending: Internal Medicine

## 2019-06-08 ENCOUNTER — Inpatient Hospital Stay: Payer: Medicare Other

## 2019-06-08 ENCOUNTER — Encounter: Payer: Self-pay | Admitting: Internal Medicine

## 2019-06-08 DIAGNOSIS — I2510 Atherosclerotic heart disease of native coronary artery without angina pectoris: Secondary | ICD-10-CM | POA: Diagnosis not present

## 2019-06-08 DIAGNOSIS — E7800 Pure hypercholesterolemia, unspecified: Secondary | ICD-10-CM | POA: Diagnosis not present

## 2019-06-08 DIAGNOSIS — I10 Essential (primary) hypertension: Secondary | ICD-10-CM | POA: Diagnosis not present

## 2019-06-08 DIAGNOSIS — R740 Nonspecific elevation of levels of transaminase and lactic acid dehydrogenase [LDH]: Secondary | ICD-10-CM

## 2019-06-08 DIAGNOSIS — F329 Major depressive disorder, single episode, unspecified: Secondary | ICD-10-CM | POA: Insufficient documentation

## 2019-06-08 DIAGNOSIS — Z79899 Other long term (current) drug therapy: Secondary | ICD-10-CM | POA: Insufficient documentation

## 2019-06-08 DIAGNOSIS — Z87891 Personal history of nicotine dependence: Secondary | ICD-10-CM

## 2019-06-08 DIAGNOSIS — C3402 Malignant neoplasm of left main bronchus: Secondary | ICD-10-CM

## 2019-06-08 DIAGNOSIS — C3492 Malignant neoplasm of unspecified part of left bronchus or lung: Secondary | ICD-10-CM | POA: Insufficient documentation

## 2019-06-08 DIAGNOSIS — R21 Rash and other nonspecific skin eruption: Secondary | ICD-10-CM

## 2019-06-08 DIAGNOSIS — M2550 Pain in unspecified joint: Secondary | ICD-10-CM

## 2019-06-08 DIAGNOSIS — I252 Old myocardial infarction: Secondary | ICD-10-CM | POA: Insufficient documentation

## 2019-06-08 DIAGNOSIS — R5383 Other fatigue: Secondary | ICD-10-CM

## 2019-06-08 LAB — CBC WITH DIFFERENTIAL/PLATELET
Abs Immature Granulocytes: 0.06 10*3/uL (ref 0.00–0.07)
Basophils Absolute: 0 10*3/uL (ref 0.0–0.1)
Basophils Relative: 1 %
Eosinophils Absolute: 0.2 10*3/uL (ref 0.0–0.5)
Eosinophils Relative: 4 %
HCT: 36.5 % (ref 36.0–46.0)
Hemoglobin: 12.3 g/dL (ref 12.0–15.0)
Immature Granulocytes: 1 %
Lymphocytes Relative: 20 %
Lymphs Abs: 1.3 10*3/uL (ref 0.7–4.0)
MCH: 29.2 pg (ref 26.0–34.0)
MCHC: 33.7 g/dL (ref 30.0–36.0)
MCV: 86.7 fL (ref 80.0–100.0)
Monocytes Absolute: 0.9 10*3/uL (ref 0.1–1.0)
Monocytes Relative: 14 %
Neutro Abs: 3.9 10*3/uL (ref 1.7–7.7)
Neutrophils Relative %: 60 %
Platelets: 165 10*3/uL (ref 150–400)
RBC: 4.21 MIL/uL (ref 3.87–5.11)
RDW: 13.3 % (ref 11.5–15.5)
WBC: 6.5 10*3/uL (ref 4.0–10.5)
nRBC: 0 % (ref 0.0–0.2)

## 2019-06-08 LAB — COMPREHENSIVE METABOLIC PANEL
ALT: 121 U/L — ABNORMAL HIGH (ref 0–44)
AST: 84 U/L — ABNORMAL HIGH (ref 15–41)
Albumin: 3.9 g/dL (ref 3.5–5.0)
Alkaline Phosphatase: 115 U/L (ref 38–126)
Anion gap: 10 (ref 5–15)
BUN: 17 mg/dL (ref 8–23)
CO2: 25 mmol/L (ref 22–32)
Calcium: 9.1 mg/dL (ref 8.9–10.3)
Chloride: 102 mmol/L (ref 98–111)
Creatinine, Ser: 0.78 mg/dL (ref 0.44–1.00)
GFR calc Af Amer: >60 mL/min (ref >60)
GFR calc non Af Amer: >60 mL/min (ref >60)
Glucose, Bld: 110 mg/dL — ABNORMAL HIGH (ref 70–99)
Potassium: 4 mmol/L (ref 3.5–5.1)
Sodium: 137 mmol/L (ref 135–145)
Total Bilirubin: 0.5 mg/dL (ref 0.3–1.2)
Total Protein: 7.3 g/dL (ref 6.5–8.1)

## 2019-06-08 LAB — CK: Total CK: 32 U/L — ABNORMAL LOW (ref 38–234)

## 2019-06-08 LAB — TSH: TSH: 1.165 u[IU]/mL (ref 0.350–4.500)

## 2019-06-08 MED ORDER — HEPARIN SOD (PORK) LOCK FLUSH 100 UNIT/ML IV SOLN
500.0000 [IU] | Freq: Once | INTRAVENOUS | Status: AC
  Administered 2019-06-08: 10:00:00 500 [IU] via INTRAVENOUS

## 2019-06-08 MED ORDER — SODIUM CHLORIDE 0.9% FLUSH
10.0000 mL | Freq: Once | INTRAVENOUS | Status: AC
  Administered 2019-06-08: 10 mL via INTRAVENOUS
  Filled 2019-06-08: qty 10

## 2019-06-08 MED ORDER — HEPARIN SOD (PORK) LOCK FLUSH 100 UNIT/ML IV SOLN
INTRAVENOUS | Status: AC
  Filled 2019-06-08: qty 5

## 2019-06-08 NOTE — Patient Instructions (Signed)
#   STOP cholesterol medication- STOP atorvastin x1 week.

## 2019-06-08 NOTE — Progress Notes (Signed)
[place] OFFICE PROGRESS NOTE  Patient Care Team: Glean Hess, MD as PCP - General (Internal Medicine) Charolette Forward, MD as Consulting Physician (Cardiology) Telford Nab, RN as Registered Nurse  Cancer Staging No matching staging information was found for the patient.   Oncology History  Small cell lung cancer ([place])  10/27/2018 Initial Diagnosis   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 [facility])   11/02/2018 -  Chemotherapy   The patient had palonosetron (ALOXI) injection 0.25 mg, 0.25 mg, Intravenous,  Once, 4 of 4 cycles Administration: 0.25 mg (11/02/2018), 0.25 mg (11/23/2018), 0.25 mg (12/19/2018), 0.25 mg (01/09/2019) pegfilgrastim-cbqv (UDENYCA) injection 6 mg, 6 mg, Subcutaneous, Once, 4 of 4 cycles Administration: 6 mg (11/07/2018), 6 mg (11/28/2018), 6 mg (12/23/2018), 6 mg (01/12/2019) CARBOplatin (PARAPLATIN) 390 mg in sodium chloride 0.9 % 250 mL chemo infusion, 390 mg (100 % of original dose 394.5 mg), Intravenous,  Once, 4 of 4 cycles Dose modification:   (original dose 394.5 mg, Cycle 1) Administration: 390 mg (11/02/2018), 390 mg (11/23/2018), 390 mg (12/19/2018), 390 mg (01/09/2019) etoposide (VEPESID) 180 mg in sodium chloride 0.9 % 500 mL chemo infusion, 100 mg/m2 = 180 mg, Intravenous,  Once, 4 of 4 cycles Administration: 180 mg (11/02/2018), 180 mg (11/03/2018), 180 mg (11/04/2018), 180 mg (11/23/2018), 180 mg (11/24/2018), 180 mg (11/25/2018), 180 mg (12/19/2018), 180 mg (12/20/2018), 180 mg (12/22/2018), 180 mg (01/09/2019), 180 mg (01/10/2019), 180 mg (01/11/2019) fosaprepitant (EMEND) 150 mg, dexamethasone (DECADRON) 12 mg in sodium chloride 0.9 % 145 mL IVPB, , Intravenous,  Once, 4 of 4 cycles Administration:  (11/02/2018),  (11/23/2018),  (12/19/2018),  (01/09/2019) atezolizumab (TECENTRIQ) 1,200 mg in sodium chloride 0.9 % 250 mL chemo infusion, 1,200 mg, Intravenous, Once, 5 of 7 cycles Administration: 1,200 mg (11/23/2018), 1,200 mg (12/19/2018), 1,200 mg  (01/09/2019), 1,200 mg (04/24/2019), 1,200 mg (05/18/2019)  for chemotherapy treatment.        INTERVAL HISTORY:  Kristi [name] [facility] 73 y.o.  female pleasant patient with a history of extensive stage small cell lung cancer currently on maintenance Tecentriq.  Patient most recently noted to have worsening joint pains muscle aches; she has difficulty getting out of the chair without holding onto the armchair.  She also notes to have difficulty raising her hand above her head.  Denies any headaches.  No falls.  Appetite is good otherwise.  No nausea no vomiting.  No skin itch.  Review of Systems  Constitutional: Positive for malaise/fatigue. Negative for chills, diaphoresis, fever and weight loss.  HENT: Negative for nosebleeds and sore throat.   Eyes: Negative for double vision.  Respiratory: Negative for cough, hemoptysis, sputum production, shortness of breath and wheezing.   Cardiovascular: Negative for chest pain, palpitations, orthopnea and leg swelling.  Gastrointestinal: Negative for abdominal pain, blood in stool, constipation, diarrhea, heartburn, melena, nausea and vomiting.  Genitourinary: Negative for dysuria, frequency and urgency.  Musculoskeletal: Positive for back pain, joint pain, myalgias and neck pain.  Skin: Negative.  Negative for itching and rash.  Neurological: Negative for dizziness, tingling, focal weakness, weakness and headaches.  Endo/Heme/Allergies: Does not bruise/bleed easily.  Psychiatric/Behavioral: Negative for depression. The patient is not nervous/anxious and does not have insomnia.       PAST MEDICAL HISTORY :  Past Medical History:  Diagnosis Date  . Acute MI, inferoposterior wall ([place]) 09/30/2014  . Claustrophobia   . Coronary artery disease   . Hypercholesteremia   . Hypertension   . MI, old   . Small cell  lung cancer in adult [facility]) 10/27/2018    PAST SURGICAL HISTORY :   Past Surgical History:  Procedure Laterality Date  . APPENDECTOMY      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  . BLADDER NECK SUSPENSION    . CHOLECYSTECTOMY N/A 08/14/2018   Procedure: LAPAROSCOPIC CHOLECYSTECTOMY;  Surgeon: Jules Husbands, MD;  Location: [facility] ORS;  Service: General;  Laterality: N/A;  . CHOLECYSTECTOMY  11/2018  . CORONARY STENT PLACEMENT    . ENDOBRONCHIAL ULTRASOUND N/A 10/21/2018   Procedure: ENDOBRONCHIAL ULTRASOUND;  Surgeon: Laverle Hobby, MD;  Location: [facility] ORS;  Service: Pulmonary;  Laterality: N/A;  . HERNIA REPAIR    . IR FLUORO GUIDE CV LINE RIGHT  12/19/2018  . LEFT HEART CATHETERIZATION WITH CORONARY ANGIOGRAM N/A 09/29/2014   Procedure: LEFT HEART CATHETERIZATION WITH CORONARY ANGIOGRAM;  Surgeon: Clent Demark, MD;  Location: [facility] CATH LAB;  Service: Cardiovascular;  Laterality: N/A;  . PORTA CATH INSERTION N/A 01/02/2019   Procedure: PORTA CATH INSERTION;  Surgeon: Algernon Huxley, MD;  Location: [facility] CV LAB;  Service: Cardiovascular;  Laterality: N/A;  . PORTACATH PLACEMENT Right 10/28/2018   Procedure: INSERTION PORT-A-CATH;  Surgeon: Jules Husbands, MD;  Location: [facility] ORS;  Service: General;  Laterality: Right;    FAMILY HISTORY :   Family History  Problem Relation Age of Onset  . Alzheimer's disease Mother   . Colon cancer Father   . Breast cancer Neg Hx     SOCIAL HISTORY:   Social History   Tobacco Use  . Smoking status: Former Smoker    Packs/day: 1.00    Years: 39.00    Pack years: 39.00    Types: Cigarettes    Start date: 12/21/1978    Quit date: 10/16/2018    Years since quitting: 0.6  . Smokeless tobacco: Never Used  Substance Use Topics  . Alcohol use: Yes    Alcohol/week: 0.0 standard drinks    Comment: occassional - approx 1 every 2 weeks   . Drug use: No    ALLERGIES:  has No Known Allergies.  MEDICATIONS:  Current Outpatient Medications  Medication Sig Dispense Refill  . atorvastatin (LIPITOR) 80 MG tablet Take 0.5 tablets (40 mg total) by mouth daily at 6 PM. (Patient taking  differently: Take 20 mg by mouth 2 (two) times daily. ) 30 tablet 3  . B Complex Vitamins (VITAMIN B COMPLEX PO) Take 1 tablet by mouth daily.     [name] Kitchen CALCIUM PO Take 1 tablet by mouth daily.     . Cholecalciferol (VITAMIN D) 2000 units CAPS Take 1 capsule (2,000 Units total) by mouth daily. 30 capsule   . diphenhydrAMINE-zinc acetate (BENADRYL EXTRA STRENGTH) cream Apply 1 application topically 3 (three) times daily as needed for itching. 28.4 g 0  . HYDROcodone-acetaminophen (NORCO/VICODIN) 5-325 MG tablet Take 1 tablet by mouth every 6 (six) hours as needed for moderate pain. 30 tablet 0  . lidocaine-prilocaine (EMLA) cream Apply to affected area once 30 g 3  . metoprolol tartrate (LOPRESSOR) 25 MG tablet Take 12.5 mg by mouth 2 (two) times daily.    [name] Kitchen omeprazole (PRILOSEC) 40 MG capsule Take 1 capsule (40 mg total) by mouth daily. 90 capsule 1  . ondansetron (ZOFRAN) 8 MG tablet Take 1 tablet (8 mg total) by mouth 2 (two) times daily as needed for refractory nausea / vomiting. Start on day 3 after carboplatin chemo. 30 tablet 1  . oxyCODONE (OXYCONTIN) 10 mg 12 hr tablet Take  1 tablet (10 mg total) by mouth every 12 (twelve) hours. 60 tablet 0  . polyethylene glycol (MIRALAX / GLYCOLAX) packet Take 17 g by mouth daily as needed.     . prochlorperazine (COMPAZINE) 10 MG tablet TAKE 1 TABLET BY MOUTH EVERY 6 HOURS AS NEEDED FOR NAUSEA OR VOMITING 270 tablet 1  . sertraline (ZOLOFT) 50 MG tablet Take 1.5 tablets (75 mg total) by mouth daily. 45 tablet 2  . triamterene-hydrochlorothiazide (DYAZIDE) 37.5-25 MG capsule Take 1 each (1 capsule total) by mouth daily as needed. 90 capsule 0  . dexamethasone (DECADRON) 4 MG tablet Take 1 tablet (4 mg total) by mouth daily. 25 tablet 0  . nitroGLYCERIN (NITROSTAT) 0.4 MG SL tablet Place 1 tablet (0.4 mg total) under the tongue every 5 (five) minutes x 3 doses as needed for chest pain. (Patient not taking: Reported on 06/08/2019) 25 tablet 12   No current  facility-administered medications for this visit.    Facility-Administered Medications Ordered in Other Visits  Medication Dose Route Frequency Provider Last Rate Last Dose  . heparin lock flush 100 unit/mL  500 Units Intravenous Once Earlie Server, MD      . sodium chloride flush (NS) 0.9 % injection 10 mL  10 mL Intravenous PRN Earlie Server, MD   10 mL at 01/09/19 0820  . sodium chloride flush (NS) 0.9 % injection 10 mL  10 mL Intravenous PRN Earlie Server, MD   10 mL at 01/10/19 1400    PHYSICAL EXAMINATION: ECOG PERFORMANCE STATUS: 1 - Symptomatic but completely ambulatory  BP 130/78 (BP Location: Right Arm, Patient Position: Sitting, Cuff Size: Normal)   Pulse 100   Temp 98.1 F (36.7 C) (Tympanic)   Resp 20   Ht 5\' 5"  (1.651 m)   Wt 153 lb (69.4 kg)   BMI 25.46 kg/m   Filed Weights   06/08/19 0900  Weight: 153 lb (69.4 kg)    Physical Exam  Constitutional: She is oriented to person, place, and time and well-developed, well-nourished, and in no distress.  HENT:  Head: Normocephalic and atraumatic.  Mouth/Throat: Oropharynx is clear and moist. No oropharyngeal exudate.  Eyes: Pupils are equal, round, and reactive to light.  Neck: Normal range of motion. Neck supple.  Cardiovascular: Normal rate and regular rhythm.  Pulmonary/Chest: No respiratory distress. She has no wheezes.  Abdominal: Soft. Bowel sounds are normal. She exhibits no distension and no mass. There is no abdominal tenderness. There is no rebound and no guarding.  Musculoskeletal: Normal range of motion.        General: No tenderness or edema.     Comments: Mild weakness noted in bilateral upper and lower extremity proximal muscles.  Neurological: She is alert and oriented to person, place, and time.  Skin: Skin is warm.  Psychiatric: Affect normal.       LABORATORY DATA:  I have reviewed the data as listed    Component Value Date/Time   NA 137 06/08/2019 0853   NA 136 09/19/2018 1038   K 4.0 06/08/2019 0853    CL 102 06/08/2019 0853   CO2 25 06/08/2019 0853   GLUCOSE 110 (H) 06/08/2019 0853   BUN 17 06/08/2019 0853   BUN 6 (L) 09/19/2018 1038   CREATININE 0.78 06/08/2019 0853   CALCIUM 9.1 06/08/2019 0853   PROT 7.3 06/08/2019 0853   PROT 7.4 09/19/2018 1038   ALBUMIN 3.9 06/08/2019 0853   ALBUMIN 4.3 09/19/2018 1038   AST 84 (H) 06/08/2019 6213  ALT 121 (H) 06/08/2019 0853   ALKPHOS 115 06/08/2019 0853   BILITOT 0.5 06/08/2019 0853   BILITOT 0.5 09/19/2018 1038   GFRNONAA >60 06/08/2019 0853   GFRAA >60 06/08/2019 0853    No results found for: SPEP, UPEP  Lab Results  Component Value Date   WBC 6.5 06/08/2019   NEUTROABS 3.9 06/08/2019   HGB 12.3 06/08/2019   HCT 36.5 06/08/2019   MCV 86.7 06/08/2019   PLT 165 06/08/2019      Chemistry      Component Value Date/Time   NA 137 06/08/2019 0853   NA 136 09/19/2018 1038   K 4.0 06/08/2019 0853   CL 102 06/08/2019 0853   CO2 25 06/08/2019 0853   BUN 17 06/08/2019 0853   BUN 6 (L) 09/19/2018 1038   CREATININE 0.78 06/08/2019 0853      Component Value Date/Time   CALCIUM 9.1 06/08/2019 0853   ALKPHOS 115 06/08/2019 0853   AST 84 (H) 06/08/2019 0853   ALT 121 (H) 06/08/2019 0853   BILITOT 0.5 06/08/2019 0853   BILITOT 0.5 09/19/2018 1038       RADIOGRAPHIC STUDIES: I have personally reviewed the radiological images as listed and agreed with the findings in the report. No results found.   ASSESSMENT & PLAN:  Cancer of hilus of left lung (HCC) #Extensive stage small cell lung cancer; status post 4 cycles of carbo etoposide Tecentriq-May 2020 CT scan shows significant response.  Currently on maintenance Tecentriq  #Hold Tecentriq today given AST ALT elevation [see discussion below]; ? proximal myopathy [see below]  #AST ALT-up to 2- 3 times upper limit; normal bilirubin.  Question alcohol versus Tecentriq.  Recommend alcohol abstinence.  Hold steroids.  Hold Tecentriq.  Reevaluate in 1 week.  #Myalgias proximal  myopathy-question Tecentriq versus others-Lipitor 40 mg a day.  Check CK; recommend holding Lipitor for the next 1 week.  Monitor closely/if needed physical therapy.  Defer to Dr. Tasia Catchings regarding restarting Lipitor.  #Depression stable Zoloft  #Skin rash-Tecentriq improved.   # DISPOSITION: # HOLD treatement today; de-access; check CK levels.  # 1 week- Dr.Yu/ labs- CMP; tecentriq- Dr.B      Orders Placed This Encounter  Procedures  . CK    Standing Status:   Future    Number of Occurrences:   1    Standing Expiration Date:   06/07/2020   All questions were answered. The patient knows to call the clinic with any problems, questions or concerns.      Cammie Sickle, MD 06/08/2019 7:06 PM

## 2019-06-08 NOTE — Assessment & Plan Note (Addendum)
#  Extensive stage small cell lung cancer; status post 4 cycles of carbo etoposide Tecentriq-May 2020 CT scan shows significant response.  Currently on maintenance Tecentriq  #Hold Tecentriq today given AST ALT elevation [see discussion below]; ? proximal myopathy [see below]  #AST ALT-up to 2- 3 times upper limit; normal bilirubin.  Question alcohol versus Tecentriq.  Recommend alcohol abstinence.  Hold steroids.  Hold Tecentriq.  Reevaluate in 1 week.  #Myalgias proximal myopathy-question Tecentriq versus others-Lipitor 40 mg a day.  Check CK; recommend holding Lipitor for the next 1 week.  Monitor closely/if needed physical therapy.  Defer to Dr. Tasia Catchings regarding restarting Lipitor.  #Depression stable Zoloft  #Skin rash-Tecentriq improved.   # DISPOSITION: # HOLD treatement today; de-access; check CK levels.  # 1 week- Dr.Yu/ labs- CMP; tecentriq- Dr.B

## 2019-06-15 ENCOUNTER — Inpatient Hospital Stay (HOSPITAL_BASED_OUTPATIENT_CLINIC_OR_DEPARTMENT_OTHER): Payer: Medicare Other | Admitting: Oncology

## 2019-06-15 ENCOUNTER — Other Ambulatory Visit: Payer: Self-pay

## 2019-06-15 ENCOUNTER — Encounter: Payer: Self-pay | Admitting: Oncology

## 2019-06-15 ENCOUNTER — Inpatient Hospital Stay: Payer: Medicare Other

## 2019-06-15 VITALS — BP 103/69 | HR 83 | Temp 98.7°F | Resp 18 | Wt 149.7 lb

## 2019-06-15 DIAGNOSIS — R740 Nonspecific elevation of levels of transaminase and lactic acid dehydrogenase [LDH]: Secondary | ICD-10-CM

## 2019-06-15 DIAGNOSIS — R5383 Other fatigue: Secondary | ICD-10-CM

## 2019-06-15 DIAGNOSIS — Z87891 Personal history of nicotine dependence: Secondary | ICD-10-CM | POA: Diagnosis not present

## 2019-06-15 DIAGNOSIS — F329 Major depressive disorder, single episode, unspecified: Secondary | ICD-10-CM

## 2019-06-15 DIAGNOSIS — R7401 Elevation of levels of liver transaminase levels: Secondary | ICD-10-CM

## 2019-06-15 DIAGNOSIS — C3490 Malignant neoplasm of unspecified part of unspecified bronchus or lung: Secondary | ICD-10-CM

## 2019-06-15 DIAGNOSIS — Z95828 Presence of other vascular implants and grafts: Secondary | ICD-10-CM

## 2019-06-15 DIAGNOSIS — I2510 Atherosclerotic heart disease of native coronary artery without angina pectoris: Secondary | ICD-10-CM | POA: Diagnosis not present

## 2019-06-15 DIAGNOSIS — C3492 Malignant neoplasm of unspecified part of left bronchus or lung: Secondary | ICD-10-CM | POA: Diagnosis not present

## 2019-06-15 DIAGNOSIS — Z5112 Encounter for antineoplastic immunotherapy: Secondary | ICD-10-CM

## 2019-06-15 DIAGNOSIS — I252 Old myocardial infarction: Secondary | ICD-10-CM | POA: Diagnosis not present

## 2019-06-15 DIAGNOSIS — R21 Rash and other nonspecific skin eruption: Secondary | ICD-10-CM | POA: Diagnosis not present

## 2019-06-15 LAB — COMPREHENSIVE METABOLIC PANEL
ALT: 62 U/L — ABNORMAL HIGH (ref 0–44)
AST: 40 U/L (ref 15–41)
Albumin: 3.9 g/dL (ref 3.5–5.0)
Alkaline Phosphatase: 98 U/L (ref 38–126)
Anion gap: 9 (ref 5–15)
BUN: 16 mg/dL (ref 8–23)
CO2: 25 mmol/L (ref 22–32)
Calcium: 9 mg/dL (ref 8.9–10.3)
Chloride: 101 mmol/L (ref 98–111)
Creatinine, Ser: 0.8 mg/dL (ref 0.44–1.00)
GFR calc Af Amer: >60 mL/min (ref >60)
GFR calc non Af Amer: >60 mL/min (ref >60)
Glucose, Bld: 109 mg/dL — ABNORMAL HIGH (ref 70–99)
Potassium: 3.9 mmol/L (ref 3.5–5.1)
Sodium: 135 mmol/L (ref 135–145)
Total Bilirubin: 0.5 mg/dL (ref 0.3–1.2)
Total Protein: 6.9 g/dL (ref 6.5–8.1)

## 2019-06-15 LAB — CBC WITH DIFFERENTIAL/PLATELET
Abs Immature Granulocytes: 0.04 10*3/uL (ref 0.00–0.07)
Basophils Absolute: 0 10*3/uL (ref 0.0–0.1)
Basophils Relative: 1 %
Eosinophils Absolute: 0.2 10*3/uL (ref 0.0–0.5)
Eosinophils Relative: 4 %
HCT: 35.8 % — ABNORMAL LOW (ref 36.0–46.0)
Hemoglobin: 12 g/dL (ref 12.0–15.0)
Immature Granulocytes: 1 %
Lymphocytes Relative: 23 %
Lymphs Abs: 1.4 10*3/uL (ref 0.7–4.0)
MCH: 29.3 pg (ref 26.0–34.0)
MCHC: 33.5 g/dL (ref 30.0–36.0)
MCV: 87.3 fL (ref 80.0–100.0)
Monocytes Absolute: 0.8 10*3/uL (ref 0.1–1.0)
Monocytes Relative: 14 %
Neutro Abs: 3.6 10*3/uL (ref 1.7–7.7)
Neutrophils Relative %: 57 %
Platelets: 157 10*3/uL (ref 150–400)
RBC: 4.1 MIL/uL (ref 3.87–5.11)
RDW: 13.6 % (ref 11.5–15.5)
WBC: 6.1 10*3/uL (ref 4.0–10.5)
nRBC: 0 % (ref 0.0–0.2)

## 2019-06-15 LAB — TSH: TSH: 0.888 u[IU]/mL (ref 0.350–4.500)

## 2019-06-15 MED ORDER — HEPARIN SOD (PORK) LOCK FLUSH 100 UNIT/ML IV SOLN
500.0000 [IU] | Freq: Once | INTRAVENOUS | Status: AC | PRN
  Administered 2019-06-15: 500 [IU]
  Filled 2019-06-15 (×2): qty 5

## 2019-06-15 MED ORDER — SODIUM CHLORIDE 0.9 % IV SOLN
Freq: Once | INTRAVENOUS | Status: AC
  Administered 2019-06-15: 10:00:00 via INTRAVENOUS
  Filled 2019-06-15: qty 250

## 2019-06-15 MED ORDER — SODIUM CHLORIDE 0.9% FLUSH
10.0000 mL | Freq: Once | INTRAVENOUS | Status: AC
  Administered 2019-06-15: 10 mL via INTRAVENOUS
  Filled 2019-06-15: qty 10

## 2019-06-15 MED ORDER — DEXAMETHASONE SODIUM PHOSPHATE 10 MG/ML IJ SOLN
10.0000 mg | Freq: Once | INTRAMUSCULAR | Status: AC
  Administered 2019-06-15: 10 mg via INTRAVENOUS
  Filled 2019-06-15: qty 1

## 2019-06-15 MED ORDER — SODIUM CHLORIDE 0.9 % IV SOLN
1200.0000 mg | Freq: Once | INTRAVENOUS | Status: AC
  Administered 2019-06-15: 1200 mg via INTRAVENOUS
  Filled 2019-06-15: qty 20

## 2019-06-15 NOTE — Progress Notes (Signed)
Patient here for follow up. States she feels weak and with no energy.

## 2019-06-17 NOTE — Progress Notes (Signed)
Hematology/Oncology Follow up note [facility] Telephone:([phone] [phone]   Patient Care Team: Glean Hess, MD as PCP - General (Internal Medicine) Charolette Forward, MD as Consulting Physician (Cardiology) Telford Nab, RN as Registered Nurse  REFERRING PROVIDER: Dr. Felicie Morn REASON FOR VISIT:  Follow-up for small cell lung cancer,   HISTORY OF PRESENTING ILLNESS:  Kristi [name] is a  73 y.o.  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  10/20/2018 CT chest with contrast showed large mediastinal mass involving both hilar, left greater than right, consistent with lung carcinoma, The mass causes narrowing of the left mainstem bronchus with resultant volume loss on the left and a mediastinal shift to the left.  Moderate size left pleural effusion. 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  # Nov 2019- Jan 2020 s/p 4 cycles of Carbo/Etoposide/Tecentriq #  01/24/2019 interim CT scan done which showed continued positive response to therapy with continued reduction in mediastinal adenopathy.  No residual measurable left lung mass. CT findings of acute emphysematous cystitis. Urology did not feel that patient has pyelonephritis and recommend treatment with antibiotics because patient's immunocompromise. Patient finished treatment.   # Chest and whole brain radiation finished in May 2020 # 04/25/2019 resume Tecentriq every 3 weeks.  INTERVAL HISTORY Kristi [name] is a 73 y.o. female who has above history reviewed by me today presents for evaluation prior to immunotherapy for treatment of extensive small cell lung cancer. She feels well at baseline. She came back from a family reunion. Was seen by Dr.Brahmanday on 06/08/2019 for Tecentriq treatments, treatment was held due to transaminitis.   Patient reports that  she had a few glasses of wine during recent family reunion.  Feels fatigued and tired. Mood is stable.  Skin rash has resolved.   Review of Systems  Constitutional: Positive for fatigue. Negative for appetite change, chills and fever.  HENT:   Negative for hearing loss and voice change.   Eyes: Negative for eye problems.  Respiratory: Negative for chest tightness, cough and shortness of breath.   Cardiovascular: Negative for chest pain.  Gastrointestinal: Negative for abdominal distention, abdominal pain, blood in stool, diarrhea and nausea.  Endocrine: Negative for hot flashes.  Genitourinary: Negative for difficulty urinating and frequency.   Musculoskeletal: Negative for arthralgias.  Skin: Negative for itching and rash.  Neurological: Negative for extremity weakness.  Hematological: Negative for adenopathy.  Psychiatric/Behavioral: Negative for confusion and depression. The patient is not nervous/anxious.      MEDICAL HISTORY:  Past Medical History:  Diagnosis Date   Acute MI, inferoposterior wall ([place]) 09/30/2014   Claustrophobia    Coronary artery disease    Hypercholesteremia    Hypertension    MI, old    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 ([place]) 10/27/2018    SURGICAL HISTORY: Past Surgical History:  Procedure Laterality Date   APPENDECTOMY     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   BLADDER NECK SUSPENSION     CHOLECYSTECTOMY N/A 08/14/2018   Procedure: LAPAROSCOPIC CHOLECYSTECTOMY;  Surgeon: Jules Husbands, MD;  Location: [facility] ORS;  Service: General;  Laterality: N/A;   CHOLECYSTECTOMY  11/2018   CORONARY STENT PLACEMENT     ENDOBRONCHIAL ULTRASOUND N/A 10/21/2018   Procedure: ENDOBRONCHIAL ULTRASOUND;  Surgeon: Laverle Hobby, MD;  Location: [facility] ORS;  Service: Pulmonary;  Laterality: N/A;   HERNIA REPAIR  IR FLUORO GUIDE CV LINE RIGHT  12/19/2018   LEFT HEART CATHETERIZATION WITH CORONARY ANGIOGRAM N/A 09/29/2014   Procedure: LEFT HEART  CATHETERIZATION WITH CORONARY ANGIOGRAM;  Surgeon: Clent Demark, MD;  Location: [place] CATH [facility];  Service: Cardiovascular;  Laterality: N/A;   PORTA CATH INSERTION N/A 01/02/2019   Procedure: PORTA CATH INSERTION;  Surgeon: Algernon Huxley, MD;  Location: [place] CV [facility];  Service: Cardiovascular;  Laterality: N/A;   PORTACATH PLACEMENT Right 10/28/2018   Procedure: INSERTION PORT-A-CATH;  Surgeon: Jules Husbands, MD;  Location: [facility] OR[facility];  Service: General;  Laterality: Right;    SOCIAL HISTORY: Social History   Socioeconomic History   Marital status: Married    Spouse name: [name]    Number of children: 3   Years of education: Not on file   Highest education level: Not on file  Occupational History   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 strain: Not very hard   Food insecurity    Worry: Not on file    Inability: Not on file   Transportation needs    Medical: No    Non-medical: No  Tobacco Use   Smoking status: Former Smoker    Packs/day: 1.00    Years: 39.00    Pack years: 39.00    Types: Cigarettes    Start date: 12/21/1978    Quit date: 10/16/2018    Years since quitting: 0.6   Smokeless tobacco: Never Used  Substance and Sexual Activity   Alcohol use: Yes    Alcohol/week: 0.0 standard drinks    Comment: occassional - approx 1 every 2 weeks    Drug use: No   Sexual activity: Yes    Birth control/protection: None  Lifestyle   Physical activity    Days per week: 0 days    Minutes per session: Not on file   Stress: To some extent  Relationships   Social connections    Talks on phone: Three times a week    Gets together: Once a week    Attends religious service: 1 to 4 times per year    Active member of club or organization: Not on file    Attends meetings of clubs or organizations: Not on file    Relationship status: Married   Intimate partner violence    Fear of current or ex partner: No     Emotionally abused: No    Physically abused: No    Forced sexual activity: No  Other Topics Concern   Not on file  Social History Narrative   Not on file    FAMILY HISTORY: Family History  Problem Relation Age of Onset   Alzheimer's disease Mother    Colon cancer Father    Breast cancer Neg Hx     ALLERGIES:  has No Known Allergies.  MEDICATIONS:  Current Outpatient Medications  Medication Sig Dispense Refill   atorvastatin (LIPITOR) 80 MG tablet Take 0.5 tablets (40 mg total) by mouth daily at 6 PM. (Patient taking differently: Take 20 mg by mouth 2 (two) times daily. ) 30 tablet 3   B Complex Vitamins (VITAMIN B COMPLEX PO) Take 1 tablet by mouth daily.      CALCIUM PO Take 1 tablet by mouth daily.      Cholecalciferol (VITAMIN D) 2000 units CAPS Take 1 capsule (2,000 Units total) by mouth daily. 30 capsule    dexamethasone (DECADRON) 4 MG tablet Take 1  tablet (4 mg total) by mouth daily. 25 tablet 0   diphenhydrAMINE-zinc acetate (BENADRYL EXTRA STRENGTH) cream Apply 1 application topically 3 (three) times daily as needed for itching. 28.4 g 0   HYDROcodone-acetaminophen (NORCO/VICODIN) 5-325 MG tablet Take 1 tablet by mouth every 6 (six) hours as needed for moderate pain. 30 tablet 0   lidocaine-prilocaine (EMLA) cream Apply to affected area once 30 g 3   metoprolol tartrate (LOPRESSOR) 25 MG tablet Take 12.5 mg by mouth 2 (two) times daily.     omeprazole (PRILOSEC) 40 MG capsule Take 1 capsule (40 mg total) by mouth daily. 90 capsule 1   ondansetron (ZOFRAN) 8 MG tablet Take 1 tablet (8 mg total) by mouth 2 (two) times daily as needed for refractory nausea / vomiting. Start on day 3 after carboplatin chemo. 30 tablet 1   oxyCODONE (OXYCONTIN) 10 mg 12 hr tablet Take 1 tablet (10 mg total) by mouth every 12 (twelve) hours. 60 tablet 0   polyethylene glycol (MIRALAX / GLYCOLAX) packet Take 17 g by mouth daily as needed.      prochlorperazine (COMPAZINE) 10 MG  tablet TAKE 1 TABLET BY MOUTH EVERY 6 HOURS AS NEEDED FOR NAUSEA OR VOMITING 270 tablet 1   sertraline (ZOLOFT) 50 MG tablet Take 1.5 tablets (75 mg total) by mouth daily. 45 tablet 2   triamterene-hydrochlorothiazide (DYAZIDE) 37.5-25 MG capsule Take 1 each (1 capsule total) by mouth daily as needed. 90 capsule 0   nitroGLYCERIN (NITROSTAT) 0.4 MG SL tablet Place 1 tablet (0.4 mg total) under the tongue every 5 (five) minutes x 3 doses as needed for chest pain. (Patient not taking: Reported on 06/08/2019) 25 tablet 12   No current facility-administered medications for this visit.    Facility-Administered Medications Ordered in Other Visits  Medication Dose Route Frequency Provider Last Rate Last Dose   heparin lock flush 100 unit/mL  500 Units Intravenous Once Earlie Server, MD       sodium chloride flush (NS) 0.9 % injection 10 mL  10 mL Intravenous PRN Earlie Server, MD   10 mL at 01/09/19 0820   sodium chloride flush (NS) 0.9 % injection 10 mL  10 mL Intravenous PRN Earlie Server, MD   10 mL at 01/10/19 1400     PHYSICAL EXAMINATION: ECOG PERFORMANCE STATUS: 1 - Symptomatic but completely ambulatory Vitals:   06/15/19 0937  BP: 103/69  Pulse: 83  Resp: 18  Temp: 98.7 F (37.1 C)  SpO2: 97%   Filed Weights   06/15/19 0937  Weight: 149 lb 11.2 oz (67.9 kg)   Physical Exam  Constitutional: She is oriented to person, place, and time. No distress.  HENT:  Head: Normocephalic and atraumatic.  Nose: Nose normal.  Mouth/Throat: Oropharynx is clear and moist. No oropharyngeal exudate.  Eyes: Pupils are equal, round, and reactive to light. EOM are normal. No scleral icterus.  Neck: Normal range of motion. Neck supple.  Cardiovascular: Normal rate and regular rhythm.  No murmur heard. Pulmonary/Chest: Effort normal. No respiratory distress. She has no rales. She exhibits no tenderness.  Decreased breath sounds bilaterally.  Abdominal: Soft. She exhibits no distension. There is no abdominal  tenderness.  Musculoskeletal: Normal range of motion.        General: No edema.  Neurological: She is alert and oriented to person, place, and time.  Skin: Skin is warm and dry. She is not diaphoretic. No erythema.  Psychiatric: Affect normal.       LABORATORY DATA:  I have reviewed the data as listed Lab Results  Component Value Date   WBC 6.1 06/15/2019   HGB 12.0 06/15/2019   HCT 35.8 (L) 06/15/2019   MCV 87.3 06/15/2019   PLT 157 06/15/2019   Recent Labs    08/12/18 1438  05/18/19 0828 06/08/19 0853 06/15/19 0905  NA 131*   < > 137 137 135  K 3.7   < > 3.7 4.0 3.9  CL 97*   < > 105 102 101  CO2 25   < > 25 25 25   GLUCOSE 122*   < > 121* 110* 109*  BUN 9   < > 22 17 16   CREATININE 0.74   < > 0.72 0.78 0.80  CALCIUM 9.1   < > 8.9 9.1 9.0  GFRNONAA >60   < > >60 >60 >60  GFRAA >60   < > >60 >60 >60  PROT 8.0   < > 6.8 7.3 6.9  ALBUMIN 4.3   < > 3.6 3.9 3.9  AST 33   < > 36 84* 40  ALT 26   < > 33 121* 62*  ALKPHOS 81   < > 99 115 98  BILITOT 0.6   < > 0.4 0.5 0.5  BILIDIR 0.2  --   --   --   --   IBILI 0.4  --   --   --   --    < > = values in this interval not displayed.    RADIOGRAPHIC STUDIES: I have personally reviewed the radiological images as listed and agreed with the findings in the report. 10/20/2018 CT chest w contrast . Large mediastinal mass involving both hila left much greater than right consistent with lung carcinoma possibly small cell lung carcinoma. This mass causes narrowing of the left mainstem bronchus with resultant volume loss on the left and mediastinal shift to the Left. 2. Moderate size left pleural effusion. 3. Coronary artery calcifications and moderate thoracic aortic atherosclerosis. PET 10/26/2018 . Large left lung mass is markedly hypermetabolic in the confluent lymphadenopathy in the mediastinum and in bilateral hilar regions also shows marked hypermetabolism. 2. Hypermetabolic metastatic lymphadenopathy in the right neck and  supraclavicular region. 3. Hypermetabolic lymphadenopathy in the hepato duodenal ligament consistent with metastatic disease. 4. Large hypermetabolic lesion posterior right acetabulum without correlate of CT finding. Features highly suspicious for bony metastatic disease. MRI brain 10/28/2018 1. No metastatic disease or acute intracranial abnormality identified. 2. 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  3. Moderately advanced but nonspecific cerebral white matter signal changes, most commonly due to chronic small vessel disease. Lesser signal changes in the deep gray matter and pons may also be small vessel related.  CT chest abdomen pelvis with contrast 12/15/2018 1 Marked response to therapy of thoracic adenopathy. 2. No new or progressive disease in the chest. 3. Coronary artery atherosclerosis. 4. 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 5. No new or progressive disease in the abdomen or pelvis 6. Pelvic floor laxity, cystocele with bladder wall thickening, suggesting a component of outlet obstruction. 7. Possible vague sclerosis within the posterior right acetabulum, in the region of hypermetabolism on prior PET. Likely an area of treated metastasis. 8. Central line tip in right brachiocephalic vein with surrounding occlusive thrombus. CT chest abdomen pelvis with contrast 01/24/2019 1. New extensive gas throughout the bladder wall and within the bladder lumen with underlying mild diffuse bladder wall thickening, compatible with acute emphysematous cystitis. 2. Two new subcentimeter  low-attenuation lesions in the right kidney, nonspecific but worrisome for mild acute right pyelonephritis given the above bladder findings. No discrete/drainable renal abscess. No hydronephrosis. 3. Continued positive response to therapy with continued reduction in mediastinal adenopathy. No residual measurable left lung mass.Stable faint sclerosis at the site of the hypermetabolic right  acetabular osseous lesion. No new or progressive metastatic disease in the chest, abdomen or pelvis. 4. Small bowel containing small periumbilical hernia without bowel complication at this time. 5. Aortic Atherosclerosis (ICD10-I70.0) and Emphysema (ICD10-J43.9).  05/05/2019 CT chest abdomen pelvis with contrast Near complete resolution of prior mediastinal lymphadenopathy.  Residual 12 mm short axis subcarinal node. Radiation changes in the lungs bilaterally.  Left lower lobe/lingular scarring.  No suspicious pulmonary nodules.  No evidence of metastatic disease in the abdomen or pelvis.   ASSESSMENT & PLAN:  1. Small cell lung cancer ([place])   2. Port-A-Cath in place   3. Other fatigue   4. Encounter for antineoplastic immunotherapy   5. Transaminitis    #Extensive small cell lung cancer S/p 4 cycles of Carboplatin, Etoposide and Tecentriq.   Status post chest and whole brain radiation. Currently on Tecentriq maintenance. Tecentriq was held due to transaminitis. Labs are reviewed and discussed with patient. Transaminitis improved.  Probably secondary to recent alcohol use. Advised patient to avoid alcohol consumption. Counts acceptable to proceed with Tecentriq today.  #Fatigue probably experiencing side effects from whole brain radiation, for immunotherapy.  TSH has been monitored and has been normal. Her depression has been well controlled.  Continue Zoloft. Follow-up with palliative care. Status post chest and whole brain radiation. Labs are reviewed and discussed with patient Counts acceptable to proceed with Tecentriq every 3 weeks treatment today. Follow up in 3 weeks for eval with next cycle of tecentriq.  The patient knows to call the clinic with any problems questions or concerns.  Earlie Server, MD, PhD

## 2019-06-26 ENCOUNTER — Other Ambulatory Visit: Payer: Self-pay

## 2019-06-26 ENCOUNTER — Inpatient Hospital Stay: Payer: Medicare Other

## 2019-06-26 ENCOUNTER — Inpatient Hospital Stay (HOSPITAL_BASED_OUTPATIENT_CLINIC_OR_DEPARTMENT_OTHER): Payer: Medicare Other | Admitting: Oncology

## 2019-06-26 ENCOUNTER — Inpatient Hospital Stay: Payer: Medicare Other | Attending: Oncology | Admitting: *Deleted

## 2019-06-26 ENCOUNTER — Encounter: Payer: Self-pay | Admitting: Oncology

## 2019-06-26 ENCOUNTER — Telehealth: Payer: Self-pay | Admitting: *Deleted

## 2019-06-26 VITALS — BP 116/75 | HR 72

## 2019-06-26 VITALS — BP 100/69 | HR 89 | Temp 98.0°F | Resp 18

## 2019-06-26 DIAGNOSIS — R410 Disorientation, unspecified: Secondary | ICD-10-CM | POA: Insufficient documentation

## 2019-06-26 DIAGNOSIS — R2689 Other abnormalities of gait and mobility: Secondary | ICD-10-CM

## 2019-06-26 DIAGNOSIS — N390 Urinary tract infection, site not specified: Secondary | ICD-10-CM

## 2019-06-26 DIAGNOSIS — Z5112 Encounter for antineoplastic immunotherapy: Secondary | ICD-10-CM | POA: Diagnosis not present

## 2019-06-26 DIAGNOSIS — R296 Repeated falls: Secondary | ICD-10-CM

## 2019-06-26 DIAGNOSIS — C3492 Malignant neoplasm of unspecified part of left bronchus or lung: Secondary | ICD-10-CM

## 2019-06-26 DIAGNOSIS — I951 Orthostatic hypotension: Secondary | ICD-10-CM | POA: Insufficient documentation

## 2019-06-26 DIAGNOSIS — Z95828 Presence of other vascular implants and grafts: Secondary | ICD-10-CM

## 2019-06-26 DIAGNOSIS — R5383 Other fatigue: Secondary | ICD-10-CM

## 2019-06-26 DIAGNOSIS — R42 Dizziness and giddiness: Secondary | ICD-10-CM | POA: Diagnosis not present

## 2019-06-26 DIAGNOSIS — Z87891 Personal history of nicotine dependence: Secondary | ICD-10-CM | POA: Insufficient documentation

## 2019-06-26 DIAGNOSIS — Z79899 Other long term (current) drug therapy: Secondary | ICD-10-CM | POA: Insufficient documentation

## 2019-06-26 DIAGNOSIS — C3490 Malignant neoplasm of unspecified part of unspecified bronchus or lung: Secondary | ICD-10-CM

## 2019-06-26 DIAGNOSIS — R531 Weakness: Secondary | ICD-10-CM | POA: Diagnosis not present

## 2019-06-26 DIAGNOSIS — E860 Dehydration: Secondary | ICD-10-CM

## 2019-06-26 DIAGNOSIS — R413 Other amnesia: Secondary | ICD-10-CM | POA: Diagnosis not present

## 2019-06-26 LAB — CBC WITH DIFFERENTIAL/PLATELET
Abs Immature Granulocytes: 0.05 10*3/uL (ref 0.00–0.07)
Basophils Absolute: 0 10*3/uL (ref 0.0–0.1)
Basophils Relative: 0 %
Eosinophils Absolute: 0.2 10*3/uL (ref 0.0–0.5)
Eosinophils Relative: 3 %
HCT: 35.4 % — ABNORMAL LOW (ref 36.0–46.0)
Hemoglobin: 11.9 g/dL — ABNORMAL LOW (ref 12.0–15.0)
Immature Granulocytes: 1 %
Lymphocytes Relative: 19 %
Lymphs Abs: 1.3 10*3/uL (ref 0.7–4.0)
MCH: 29.2 pg (ref 26.0–34.0)
MCHC: 33.6 g/dL (ref 30.0–36.0)
MCV: 87 fL (ref 80.0–100.0)
Monocytes Absolute: 0.9 10*3/uL (ref 0.1–1.0)
Monocytes Relative: 13 %
Neutro Abs: 4.4 10*3/uL (ref 1.7–7.7)
Neutrophils Relative %: 64 %
Platelets: 142 10*3/uL — ABNORMAL LOW (ref 150–400)
RBC: 4.07 MIL/uL (ref 3.87–5.11)
RDW: 13.5 % (ref 11.5–15.5)
WBC: 6.9 10*3/uL (ref 4.0–10.5)
nRBC: 0 % (ref 0.0–0.2)

## 2019-06-26 LAB — URINALYSIS, COMPLETE (UACMP) WITH MICROSCOPIC
Bilirubin Urine: NEGATIVE
Glucose, UA: NEGATIVE mg/dL
Ketones, ur: NEGATIVE mg/dL
Nitrite: NEGATIVE
Protein, ur: NEGATIVE mg/dL
Specific Gravity, Urine: 1.01 (ref 1.005–1.030)
WBC, UA: >50 WBC/hpf — ABNORMAL HIGH (ref 0–5)
pH: 6 (ref 5.0–8.0)

## 2019-06-26 LAB — COMPREHENSIVE METABOLIC PANEL
ALT: 31 U/L (ref 0–44)
AST: 28 U/L (ref 15–41)
Albumin: 3.8 g/dL (ref 3.5–5.0)
Alkaline Phosphatase: 81 U/L (ref 38–126)
Anion gap: 9 (ref 5–15)
BUN: 15 mg/dL (ref 8–23)
CO2: 25 mmol/L (ref 22–32)
Calcium: 9 mg/dL (ref 8.9–10.3)
Chloride: 101 mmol/L (ref 98–111)
Creatinine, Ser: 0.69 mg/dL (ref 0.44–1.00)
GFR calc Af Amer: >60 mL/min (ref >60)
GFR calc non Af Amer: >60 mL/min (ref >60)
Glucose, Bld: 111 mg/dL — ABNORMAL HIGH (ref 70–99)
Potassium: 4.1 mmol/L (ref 3.5–5.1)
Sodium: 135 mmol/L (ref 135–145)
Total Bilirubin: 0.8 mg/dL (ref 0.3–1.2)
Total Protein: 7.1 g/dL (ref 6.5–8.1)

## 2019-06-26 LAB — MAGNESIUM: Magnesium: 1.8 mg/dL (ref 1.7–2.4)

## 2019-06-26 MED ORDER — SODIUM CHLORIDE 0.9% FLUSH
10.0000 mL | Freq: Once | INTRAVENOUS | Status: AC
  Administered 2019-06-26: 10 mL via INTRAVENOUS
  Filled 2019-06-26: qty 10

## 2019-06-26 MED ORDER — SODIUM CHLORIDE 0.9 % IV SOLN
Freq: Once | INTRAVENOUS | Status: AC
  Administered 2019-06-26: 12:00:00 via INTRAVENOUS
  Filled 2019-06-26: qty 250

## 2019-06-26 MED ORDER — DEXAMETHASONE SODIUM PHOSPHATE 10 MG/ML IJ SOLN
10.0000 mg | Freq: Once | INTRAMUSCULAR | Status: AC
  Administered 2019-06-26: 12:00:00 10 mg via INTRAVENOUS
  Filled 2019-06-26: qty 1

## 2019-06-26 MED ORDER — CIPROFLOXACIN HCL 500 MG PO TABS: 500.0000 mg | ORAL_TABLET | Freq: Two times a day (BID) | ORAL | 0 refills | Status: DC

## 2019-06-26 MED ORDER — HEPARIN SOD (PORK) LOCK FLUSH 100 UNIT/ML IV SOLN
500.0000 [IU] | Freq: Once | INTRAVENOUS | Status: AC
  Administered 2019-06-26: 500 [IU] via INTRAVENOUS

## 2019-06-26 NOTE — Patient Instructions (Addendum)
I am so sorry you are not feeling well today.  Today while in clinic we gave you 1 L of IV fluids because your blood pressure was low.  I suspect this is because you were were dehydrated.  I noticed your blood pressure dropped somewhat when you went from a sitting to a standing position. Please keep this in mind when getting up and moving around your home.  Change positions slowly. Also, remember to stay hydrated.  We also checked your blood work today which looked really good.  Your liver enzymes have returned to normal.   We checked a urinalysis to rule out a UTI and it is not 100% but I believe you may have a urinary tract infection.  I will receive your urinary culture by tomorrow to clarify.  In the meantime, I will start you on an antibiotic.  I believe the combination of dehydration and possible UTI has contributed to your weakness and feeling off balance.  I am hoping that a fluids have helped some and you feel a bit better.  Please keep an eye on your blood pressure.  If your top number is below 100 please do not give yourself your blood pressure medicine.  Pick up a blood pressure cuff at your pharmacy ASAP.  You do not have any allergies to medications.  I have prescribed Cipro 500 mg to be taken twice a day for a total of 7 days. This was sent to your pharmacy.  I will be calling to check up on you this week.  If you have any questions or concerns please feel free to call us.  Faythe Casa, NP 06/26/2019 1:26 PM    Orthostatic Hypotension Blood pressure is a measurement of how strongly, or weakly, your blood is pressing against the walls of your arteries. Orthostatic hypotension is a sudden drop in blood pressure that happens when you quickly change positions, such as when you get up from sitting or lying down. Arteries are blood vessels that carry blood from your heart throughout your body. When blood pressure is too low, you may not get enough blood to your brain or to the rest of  your organs. This can cause weakness, light-headedness, rapid heartbeat, and fainting. This can last for just a few seconds or for up to a few minutes. Orthostatic hypotension is usually not a serious problem. However, if it happens frequently or gets worse, it may be a sign of something more serious. What are the causes? This condition may be caused by:  Sudden changes in posture, such as standing up quickly after you have been sitting or lying down.  Blood loss.  Loss of body fluids (dehydration).  Heart problems.  Hormone (endocrine) problems.  Pregnancy.  Severe infection.  Lack of certain nutrients.  Severe allergic reactions (anaphylaxis).  Certain medicines, such as blood pressure medicine or medicines that make the body lose excess fluids (diuretics). Sometimes, this condition can be caused by not taking medicine as directed, such as taking too much of a certain medicine. What increases the risk? The following factors may make you more likely to develop this condition:  Age. Risk increases as you get older.  Conditions that affect the heart or the central nervous system.  Taking certain medicines, such as blood pressure medicine or diuretics.  Being pregnant. What are the signs or symptoms? Symptoms of this condition may include:  Weakness.  Light-headedness.  Dizziness.  Blurred vision.  Fatigue.  Rapid heartbeat.  Fainting, in severe  cases. How is this diagnosed? This condition is diagnosed based on:  Your medical history.  Your symptoms.  Your blood pressure measurement. Your health care provider will check your blood pressure when you are: ? Lying down. ? Sitting. ? Standing. A blood pressure reading is recorded as two numbers, such as "120 over 80" (or 120/80). The first ("top") number is called the systolic pressure. It is a measure of the pressure in your arteries as your heart beats. The second ("bottom") number is called the diastolic  pressure. It is a measure of the pressure in your arteries when your heart relaxes between beats. Blood pressure is measured in a unit called mm Hg. Healthy blood pressure for most adults is 120/80. If your blood pressure is below 90/60, you may be diagnosed with hypotension. Other information or tests that may be used to diagnose orthostatic hypotension include:  Your other vital signs, such as your heart rate and temperature.  Blood tests.  Tilt table test. For this test, you will be safely secured to a table that moves you from a lying position to an upright position. Your heart rhythm and blood pressure will be monitored during the test. How is this treated? This condition may be treated by:  Changing your diet. This may involve eating more salt (sodium) or drinking more water.  Taking medicines to raise your blood pressure.  Changing the dosage of certain medicines you are taking that might be lowering your blood pressure.  Wearing compression stockings. These stockings help to prevent blood clots and reduce swelling in your legs. In some cases, you may need to go to the hospital for:  Fluid replacement. This means you will receive fluids through an IV.  Blood replacement. This means you will receive donated blood through an IV (transfusion).  Treating an infection or heart problems, if this applies.  Monitoring. You may need to be monitored while medicines that you are taking wear off. Follow these instructions at home: Eating and drinking   Drink enough fluid to keep your urine pale yellow.  Eat a healthy diet, and follow instructions from your health care provider about eating or drinking restrictions. A healthy diet includes: ? Fresh fruits and vegetables. ? Whole grains. ? Lean meats. ? Low-fat dairy products.  Eat extra salt only as directed. Do not add extra salt to your diet unless your health care provider told you to do that.  Eat frequent, small  meals.  Avoid standing up suddenly after eating. Medicines  Take over-the-counter and prescription medicines only as told by your health care provider. ? Follow instructions from your health care provider about changing the dosage of your current medicines, if this applies. ? Do not stop or adjust any of your medicines on your own. General instructions   Wear compression stockings as told by your health care provider.  Get up slowly from lying down or sitting positions. This gives your blood pressure a chance to adjust.  Avoid hot showers and excessive heat as directed by your health care provider.  Return to your normal activities as told by your health care provider. Ask your health care provider what activities are safe for you.  Do not use any products that contain nicotine or tobacco, such as cigarettes, e-cigarettes, and chewing tobacco. If you need help quitting, ask your health care provider.  Keep all follow-up visits as told by your health care provider. This is important. Contact a health care provider if you:  Vomit.  Have diarrhea.  Have a fever for more than 2-3 days.  Feel more thirsty than usual.  Feel weak and tired. Get help right away if you:  Have chest pain.  Have a fast or irregular heartbeat.  Develop numbness in any part of your body.  Cannot move your arms or your legs.  Have trouble speaking.  Become sweaty or feel light-headed.  Faint.  Feel short of breath.  Have trouble staying awake.  Feel confused. Summary  Orthostatic hypotension is a sudden drop in blood pressure that happens when you quickly change positions.  Orthostatic hypotension is usually not a serious problem.  It is diagnosed by having your blood pressure taken lying down, sitting, and then standing.  It may be treated by changing your diet or adjusting your medicines. This information is not intended to replace advice given to you by your health care provider.  Make sure you discuss any questions you have with your health care provider. Document Released: 11/27/2002 Document Revised: 06/02/2018 Document Reviewed: 06/02/2018 Elsevier Patient Education  [employer].  Ciprofloxacin tablets What is this medicine? CIPROFLOXACIN (sip roe FLOX a sin) is a quinolone antibiotic. It is used to treat certain kinds of bacterial infections. It will not work for colds, flu, or other viral infections. This medicine may be used for other purposes; ask your health care provider or pharmacist if you have questions. COMMON BRAND NAME(S): Cipro What should I tell my health care provider before I take this medicine? They need to know if you have any of these conditions:  bone problems  diabetes  heart disease  high blood pressure  history of irregular heartbeat  history of low levels of potassium in the blood  joint problems  kidney disease  liver disease  mental illness  myasthenia gravis  seizures  tendon problems  tingling of the fingers or toes, or other nerve disorder  an unusual or allergic reaction to ciprofloxacin, other antibiotics or medicines, foods, dyes, or preservatives  pregnant or trying to get pregnant  breast-feeding How should I use this medicine? Take this medicine by mouth with a full glass of water. Follow the directions on the prescription label. You can take it with or without food. If it upsets your stomach, take it with food. Take your medicine at regular intervals. Do not take your medicine more often than directed. Take all of your medicine as directed even if you think you are better. Do not skip doses or stop your medicine early. Avoid antacids, aluminum, calcium, iron, magnesium, and zinc products for 6 hours before and 2 hours after taking a dose of this medicine. A special MedGuide will be given to you by the pharmacist with each prescription and refill. Be sure to read this information carefully each  time. Talk to your pediatrician regarding the use of this medicine in children. Special care may be needed. Overdosage: If you think you have taken too much of this medicine contact a poison control center or emergency room at once. NOTE: This medicine is only for you. Do not share this medicine with others. What if I miss a dose? If you miss a dose, take it as soon as you can. If it is almost time for your next dose, take only that dose. Do not take double or extra doses. What may interact with this medicine? Do not take this medicine with any of the following medications:  cisapride  dronedarone  flibanserin  lomitapide  pimozide  thioridazine  tizanidine This medicine may also interact with the following medications:  antacids  birth control pills  caffeine  certain medicines for diabetes, like glipizide, glyburide, or insulin  certain medicines that treat or prevent blood clots like warfarin  clozapine  cyclosporine  didanosine buffered tablets or powder  dofetilide  duloxetine  lanthanum carbonate  lidocaine  methotrexate  multivitamins  NSAIDS, medicines for pain and inflammation, like ibuprofen or naproxen  olanzapine  omeprazole  other medicines that prolong the QT interval (cause an abnormal heart rhythm)  phenytoin  probenecid  ropinirole  sevelamer  sildenafil  sucralfate  theophylline  ziprasidone  zolpidem This list may not describe all possible interactions. Give your health care provider a list of all the medicines, herbs, non-prescription drugs, or dietary supplements you use. Also tell them if you smoke, drink alcohol, or use illegal drugs. Some items may interact with your medicine. What should I watch for while using this medicine? Tell your doctor or health care provider if your symptoms do not start to get better or if they get worse. This medicine may cause serious skin reactions. They can happen weeks to months  after starting the medicine. Contact your health care provider right away if you notice fevers or flu-like symptoms with a rash. The rash may be red or purple and then turn into blisters or peeling of the skin. Or, you might notice a red rash with swelling of the face, lips or lymph nodes in your neck or under your arms. Do not treat diarrhea with over the counter products. Contact your doctor if you have diarrhea that lasts more than 2 days or if it is severe and watery. Check with your doctor or health care provider if you get an attack of severe diarrhea, nausea and vomiting, or if you sweat a lot. The loss of too much body fluid can make it dangerous for you to take this medicine. This medicine may increase blood sugar. Ask your health care provider if changes in diet or medicines are needed if you have diabetes. You may get drowsy or dizzy. Do not drive, use machinery, or do anything that needs mental alertness until you know how this medicine affects you. Do not sit or stand up quickly, especially if you are an older patient. This reduces the risk of dizzy or fainting spells. This medicine can make you more sensitive to the sun. Keep out of the sun. If you cannot avoid being in the sun, wear protective clothing and use sunscreen. Do not use sun lamps or tanning beds/booths. What side effects may I notice from receiving this medicine? Side effects that you should report to your doctor or health care professional as soon as possible:  allergic reactions like skin rash or hives, swelling of the face, lips, or tongue  anxious  bloody or watery diarrhea  confusion  depressed mood  fast, irregular heartbeat  fever  hallucination, loss of contact with reality  joint, muscle, or tendon pain or swelling  loss of memory  pain, tingling, numbness in the hands or feet  redness, blistering, peeling or loosening of the skin, including inside the mouth  seizures  signs and symptoms of  aortic dissection such as sudden chest, stomach, or back pain  signs and symptoms of high blood sugar such as being more thirsty or hungry or having to urinate more than normal. You may also feel very tired or have blurry vision.  signs and symptoms of liver injury like dark yellow  or brown urine; general ill feeling or flu-like symptoms; light-colored stools; loss of appetite; nausea; right upper belly pain; unusually weak or tired; yellowing of the eyes or skin  signs and symptoms of low blood sugar such as feeling anxious; confusion; dizziness; increased hunger; unusually weak or tired; sweating; shakiness; cold; irritable; headache; blurred vision; fast heartbeat; loss of consciousness; pale skin  suicidal thoughts or other mood changes  sunburn  unusually weak or tired Side effects that usually do not require medical attention (report to your doctor or health care professional if they continue or are bothersome):  dry mouth  headache  nausea  trouble sleeping This list may not describe all possible side effects. Call your doctor for medical advice about side effects. You may report side effects to FDA at 1-800-FDA-1088. Where should I keep my medicine? Keep out of the reach of children. Store at room temperature below 30 degrees C (86 degrees F). Keep container tightly closed. Throw away any unused medicine after the expiration date. NOTE: This sheet is a summary. It may not cover all possible information. If you have questions about this medicine, talk to your doctor, pharmacist, or health care provider.  2020 Elsevier/Gold Standard (2019-03-09 11:26:08)   Urinary Tract Infection, Adult A urinary tract infection (UTI) is an infection of any part of the urinary tract. The urinary tract includes:  The kidneys.  The ureters.  The bladder.  The urethra. These organs make, store, and get rid of pee (urine) in the body. What are the causes? This is caused by germs  (bacteria) in your genital area. These germs grow and cause swelling (inflammation) of your urinary tract. What increases the risk? You are more likely to develop this condition if:  You have a small, thin tube (catheter) to drain pee.  You cannot control when you pee or poop (incontinence).  You are female, and: ? You use these methods to prevent pregnancy: ? A medicine that kills sperm (spermicide). ? A device that blocks sperm (diaphragm). ? You have low levels of a female hormone (estrogen). ? You are pregnant.  You have genes that add to your risk.  You are sexually active.  You take antibiotic medicines.  You have trouble peeing because of: ? A prostate that is bigger than normal, if you are female. ? A blockage in the part of your body that drains pee from the bladder (urethra). ? A kidney stone. ? A nerve condition that affects your bladder (neurogenic bladder). ? Not getting enough to drink. ? Not peeing often enough.  You have other conditions, such as: ? Diabetes. ? A weak disease-fighting system (immune system). ? Sickle cell disease. ? Gout. ? Injury of the spine. What are the signs or symptoms? Symptoms of this condition include:  Needing to pee right away (urgently).  Peeing often.  Peeing small amounts often.  Pain or burning when peeing.  Blood in the pee.  Pee that smells bad or not like normal.  Trouble peeing.  Pee that is cloudy.  Fluid coming from the vagina, if you are female.  Pain in the belly or lower back. Other symptoms include:  Throwing up (vomiting).  No urge to eat.  Feeling mixed up (confused).  Being tired and grouchy (irritable).  A fever.  Watery poop (diarrhea). How is this treated? This condition may be treated with:  Antibiotic medicine.  Other medicines.  Drinking enough water. Follow these instructions at home:  Medicines  Take over-the-counter and prescription  medicines only as told by your  doctor.  If you were prescribed an antibiotic medicine, take it as told by your doctor. Do not stop taking it even if you start to feel better. General instructions  Make sure you: ? Pee until your bladder is empty. ? Do not hold pee for a long time. ? Empty your bladder after sex. ? Wipe from front to back after pooping if you are a female. Use each tissue one time when you wipe.  Drink enough fluid to keep your pee pale yellow.  Keep all follow-up visits as told by your doctor. This is important. Contact a doctor if:  You do not get better after 1-2 days.  Your symptoms go away and then come back. Get help right away if:  You have very bad back pain.  You have very bad pain in your lower belly.  You have a fever.  You are sick to your stomach (nauseous).  You are throwing up. Summary  A urinary tract infection (UTI) is an infection of any part of the urinary tract.  This condition is caused by germs in your genital area.  There are many risk factors for a UTI. These include having a small, thin tube to drain pee and not being able to control when you pee or poop.  Treatment includes antibiotic medicines for germs.  Drink enough fluid to keep your pee pale yellow. This information is not intended to replace advice given to you by your health care provider. Make sure you discuss any questions you have with your health care provider. Document Released: 05/25/2008 Document Revised: 11/24/2018 Document Reviewed: 06/16/2018 Elsevier Patient Education  2020 [employer].

## 2019-06-26 NOTE — Progress Notes (Signed)
Symptom Management Consult note [facility]  [phone] [phone]  Patient Care Team: Glean Hess, MD as PCP - General (Internal Medicine) Charolette Forward, MD as Consulting Physician (Cardiology) Telford Nab, RN as Registered Nurse   Name of the patient: Kristi [name]  [id number]  December 02, 1946   Date of visit: 06/26/2019   Diagnosis-small cell lung cancer  Chief complaint/ Reason for visit-dizziness/falls  Heme/Onc history:  Oncology History Overview Note  Kristi [name] is a  73 y.o.  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   10/20/2018 CT chest with contrast showed large mediastinal mass involving both hilar, left greater than right, consistent with lung carcinoma, The mass causes narrowing of the left mainstem bronchus with resultant volume loss on the left and a mediastinal shift to the left.  Moderate size left pleural effusion. 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   # Nov 2019- Jan 2020 s/p 4 cycles of Carbo/Etoposide/Tecentriq #  01/24/2019 interim CT scan done which showed continued positive response to therapy with continued reduction in mediastinal adenopathy.  No residual measurable left lung mass. CT findings of acute emphysematous cystitis. Urology did not feel that patient has pyelonephritis and recommend treatment with antibiotics because patient's immunocompromise. Patient finished treatment.    # Chest and whole brain radiation finished in May 2020 # 04/25/2019 resume Tecentriq every 3 weeks.   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 [facility])   11/02/2018 -  Chemotherapy   The patient had palonosetron (ALOXI) injection 0.25 mg, 0.25 mg, Intravenous,  Once, 4 of 4 cycles Administration: 0.25 mg (11/02/2018), 0.25 mg  (11/23/2018), 0.25 mg (12/19/2018), 0.25 mg (01/09/2019) pegfilgrastim-cbqv (UDENYCA) injection 6 mg, 6 mg, Subcutaneous, Once, 4 of 4 cycles Administration: 6 mg (11/07/2018), 6 mg (11/28/2018), 6 mg (12/23/2018), 6 mg (01/12/2019) CARBOplatin (PARAPLATIN) 390 mg in sodium chloride 0.9 % 250 mL chemo infusion, 390 mg (100 % of original dose 394.5 mg), Intravenous,  Once, 4 of 4 cycles Dose modification:   (original dose 394.5 mg, Cycle 1) Administration: 390 mg (11/02/2018), 390 mg (11/23/2018), 390 mg (12/19/2018), 390 mg (01/09/2019) etoposide (VEPESID) 180 mg in sodium chloride 0.9 % 500 mL chemo infusion, 100 mg/m2 = 180 mg, Intravenous,  Once, 4 of 4 cycles Administration: 180 mg (11/02/2018), 180 mg (11/03/2018), 180 mg (11/04/2018), 180 mg (11/23/2018), 180 mg (11/24/2018), 180 mg (11/25/2018), 180 mg (12/19/2018), 180 mg (12/20/2018), 180 mg (12/22/2018), 180 mg (01/09/2019), 180 mg (01/10/2019), 180 mg (01/11/2019) fosaprepitant (EMEND) 150 mg, dexamethasone (DECADRON) 12 mg in sodium chloride 0.9 % 145 mL IVPB, , Intravenous,  Once, 4 of 4 cycles Administration:  (11/02/2018),  (11/23/2018),  (12/19/2018),  (01/09/2019) atezolizumab (TECENTRIQ) 1,200 mg in sodium chloride 0.9 % 250 mL chemo infusion, 1,200 mg, Intravenous, Once, 6 of 7 cycles Administration: 1,200 mg (11/23/2018), 1,200 mg (12/19/2018), 1,200 mg (01/09/2019), 1,200 mg (04/24/2019), 1,200 mg (05/18/2019), 1,200 mg (06/15/2019)  for chemotherapy treatment.       Interval history-73 year old female who [facility] to [facility] for complaints of dizziness and frequent falls that began approximately 3 days ago.  States she has felt really good during chemotherapy and radiation with little to no complaints and all of a sudden has started feeling tired, weak and has had several falls since Friday.  Admits to problems with "finding  words" when asked a question.  She states she really has to think before responding.  The stumbling and falling is brand-new to her.   She denies injury and most the time she has been caught by her husband who stands behind her or wheels her in a wheelchair.  She admits to dizziness when standing and changing positions.  She does not feel dehydrated and drinks plenty of fluids at home estimating three 16 ounce waters and several sodas.  Her husband states she is less active and is unable to walk for long periods of time.  Her activity level has declined over the course of several months. Denies recent fevers or illnesses. Denies any easy bleeding or bruising. Reports good appetite and denies weight loss. Denies chest pain. Denies any nausea, vomiting, constipation, or diarrhea. Denies urinary complaints.   ECOG FS:1 - Symptomatic but completely ambulatory  Review of systems- Review of Systems  Constitutional: Positive for malaise/fatigue. Negative for chills, fever and weight loss.  HENT: Negative for congestion, ear pain and tinnitus.   Eyes: Negative.  Negative for blurred vision and double vision.  Respiratory: Negative.  Negative for cough, sputum production and shortness of breath.   Cardiovascular: Negative.  Negative for chest pain, palpitations and leg swelling.  Gastrointestinal: Negative.  Negative for abdominal pain, constipation, diarrhea, nausea and vomiting.  Genitourinary: Negative for dysuria, frequency and urgency.  Musculoskeletal: Positive for falls. Negative for back pain.  Skin: Negative.  Negative for rash.  Neurological: Positive for dizziness and weakness. Negative for headaches.  Endo/Heme/Allergies: Negative.  Does not bruise/bleed easily.  Psychiatric/Behavioral: Positive for memory loss. Negative for depression. The patient is not nervous/anxious and does not have insomnia.      Current treatment- s/p 4 cycles carbo/etoposide/Tecentriq.  Completed prophylactic whole brain radiation in May 2020.  Currently on maintenance Tecentriq every 3 weeks.  Last given on 06/15/2019.  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 ([place]) 10/27/2018     Past Surgical History:  Procedure Laterality Date   APPENDECTOMY     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   BLADDER NECK SUSPENSION     CHOLECYSTECTOMY N/A 08/14/2018   Procedure: LAPAROSCOPIC CHOLECYSTECTOMY;  Surgeon: Jules Husbands, MD;  Location: [facility] ORS;  Service: General;  Laterality: N/A;   CHOLECYSTECTOMY  11/2018   CORONARY STENT PLACEMENT     ENDOBRONCHIAL ULTRASOUND N/A 10/21/2018   Procedure: ENDOBRONCHIAL ULTRASOUND;  Surgeon: Laverle Hobby, MD;  Location: [facility] ORS;  Service: Pulmonary;  Laterality: N/A;   HERNIA REPAIR     IR FLUORO GUIDE CV LINE RIGHT  12/19/2018   LEFT HEART CATHETERIZATION WITH CORONARY ANGIOGRAM N/A 09/29/2014   Procedure: LEFT HEART CATHETERIZATION WITH CORONARY ANGIOGRAM;  Surgeon: Clent Demark, MD;  Location: [place] CATH LAB;  Service: Cardiovascular;  Laterality: N/A;   PORTA CATH INSERTION N/A 01/02/2019   Procedure: PORTA CATH INSERTION;  Surgeon: Algernon Huxley, MD;  Location: [place] CV LAB;  Service: Cardiovascular;  Laterality: N/A;   PORTACATH PLACEMENT Right 10/28/2018   Procedure: INSERTION PORT-A-CATH;  Surgeon: Jules Husbands, MD;  Location: [facility] ORS;  Service: General;  Laterality: Right;    Social History   Socioeconomic History   Marital status: Married    Spouse  name: [name]    Number of children: 3   Years of education: Not on file   Highest education level: Not on file  Occupational History   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 strain: Not very hard   Food insecurity    Worry: Not on file    Inability: Not on file   Transportation needs    Medical: No    Non-medical: No  Tobacco Use   Smoking status: Former Smoker     Packs/day: 1.00    Years: 39.00    Pack years: 39.00    Types: Cigarettes    Start date: 12/21/1978    Quit date: 10/16/2018    Years since quitting: 0.6   Smokeless tobacco: Never Used  Substance and Sexual Activity   Alcohol use: Yes    Alcohol/week: 0.0 standard drinks    Comment: occassional - approx 1 every 2 weeks    Drug use: No   Sexual activity: Yes    Birth control/protection: None  Lifestyle   Physical activity    Days per week: 0 days    Minutes per session: Not on file   Stress: To some extent  Relationships   Social connections    Talks on phone: Three times a week    Gets together: Once a week    Attends religious service: 1 to 4 times per year    Active member of club or organization: Not on file    Attends meetings of clubs or organizations: Not on file    Relationship status: Married   Intimate partner violence    Fear of current or ex partner: No    Emotionally abused: No    Physically abused: No    Forced sexual activity: No  Other Topics Concern   Not on file  Social History Narrative   Not on file    Family History  Problem Relation Age of Onset   Alzheimer's disease Mother    Colon cancer Father    Breast cancer Neg Hx      Current Outpatient Medications:    B Complex Vitamins (VITAMIN B COMPLEX PO), Take 1 tablet by mouth daily. , Disp: , Rfl:    CALCIUM PO, Take 1 tablet by mouth daily. , Disp: , Rfl:    Cholecalciferol (VITAMIN D) 2000 units CAPS, Take 1 capsule (2,000 Units total) by mouth daily., Disp: 30 capsule, Rfl:    diphenhydrAMINE-zinc acetate (BENADRYL EXTRA STRENGTH) cream, Apply 1 application topically 3 (three) times daily as needed for itching., Disp: 28.4 g, Rfl: 0   HYDROcodone-acetaminophen (NORCO/VICODIN) 5-325 MG tablet, Take 1 tablet by mouth every 6 (six) hours as needed for moderate pain., Disp: 30 tablet, Rfl: 0   lidocaine-prilocaine (EMLA) cream, Apply to affected area once, Disp: 30 g, Rfl:  3   metoprolol tartrate (LOPRESSOR) 25 MG tablet, Take 12.5 mg by mouth 2 (two) times daily., Disp: , Rfl:    omeprazole (PRILOSEC) 40 MG capsule, Take 1 capsule (40 mg total) by mouth daily., Disp: 90 capsule, Rfl: 1   ondansetron (ZOFRAN) 8 MG tablet, Take 1 tablet (8 mg total) by mouth 2 (two) times daily as needed for refractory nausea / vomiting. Start on day 3 after carboplatin chemo., Disp: 30 tablet, Rfl: 1   oxyCODONE (OXYCONTIN) 10 mg 12 hr tablet, Take 1 tablet (10 mg total) by mouth every 12 (twelve) hours., Disp: 60 tablet, Rfl: 0  prochlorperazine (COMPAZINE) 10 MG tablet, TAKE 1 TABLET BY MOUTH EVERY 6 HOURS AS NEEDED FOR NAUSEA OR VOMITING, Disp: 270 tablet, Rfl: 1   sertraline (ZOLOFT) 50 MG tablet, Take 1.5 tablets (75 mg total) by mouth daily., Disp: 45 tablet, Rfl: 2   triamterene-hydrochlorothiazide (DYAZIDE) 37.5-25 MG capsule, Take 1 each (1 capsule total) by mouth daily as needed., Disp: 90 capsule, Rfl: 0   atorvastatin (LIPITOR) 80 MG tablet, Take 0.5 tablets (40 mg total) by mouth daily at 6 PM. (Patient not taking: Reported on 06/26/2019), Disp: 30 tablet, Rfl: 3   ciprofloxacin (CIPRO) 500 MG tablet, Take 1 tablet (500 mg total) by mouth 2 (two) times daily., Disp: 14 tablet, Rfl: 0   dexamethasone (DECADRON) 4 MG tablet, Take 1 tablet (4 mg total) by mouth daily. (Patient not taking: Reported on 06/26/2019), Disp: 25 tablet, Rfl: 0   nitroGLYCERIN (NITROSTAT) 0.4 MG SL tablet, Place 1 tablet (0.4 mg total) under the tongue every 5 (five) minutes x 3 doses as needed for chest pain. (Patient not taking: Reported on 06/08/2019), Disp: 25 tablet, Rfl: 12   polyethylene glycol (MIRALAX / GLYCOLAX) packet, Take 17 g by mouth daily as needed. , Disp: , Rfl:  No current facility-administered medications for this visit.   Facility-Administered Medications Ordered in Other Visits:    heparin lock flush 100 unit/mL, 500 Units, Intravenous, Once, Earlie Server, MD   sodium  chloride flush (NS) 0.9 % injection 10 mL, 10 mL, Intravenous, PRN, Earlie Server, MD, 10 mL at 01/09/19 0820   sodium chloride flush (NS) 0.9 % injection 10 mL, 10 mL, Intravenous, PRN, Earlie Server, MD, 10 mL at 01/10/19 1400  Physical exam:  Vitals:   06/26/19 1118  BP: 100/69  Pulse: 89  Resp: 18  Temp: 98 F (36.7 C)  TempSrc: Tympanic  SpO2: 96%   Physical Exam Constitutional:      Appearance: Normal appearance.  HENT:     Head: Normocephalic and atraumatic.  Eyes:     Pupils: Pupils are equal, round, and reactive to light.  Neck:     Musculoskeletal: Normal range of motion.  Cardiovascular:     Rate and Rhythm: Normal rate and regular rhythm.     Heart sounds: Normal heart sounds. No murmur.  Pulmonary:     Effort: Pulmonary effort is normal.     Breath sounds: Normal breath sounds. No wheezing.  Abdominal:     General: Bowel sounds are normal. There is no distension.     Palpations: Abdomen is soft.     Tenderness: There is no abdominal tenderness.  Musculoskeletal: Normal range of motion.  Skin:    General: Skin is warm and dry.     Findings: No rash.  Neurological:     Mental Status: She is alert. She is disoriented.     Motor: Weakness present.     Comments: Disoriented to date.  Otherwise oriented.  Patient forgetful and when asked questions she was "word seeking".  Psychiatric:        Judgment: Judgment normal.      CMP Latest Ref Rng & Units 06/26/2019  Glucose 70 - 99 mg/dL 111(H)  BUN 8 - 23 mg/dL 15  Creatinine 0.44 - 1.00 mg/dL 0.69  Sodium 135 - 145 mmol/L 135  Potassium 3.5 - 5.1 mmol/L 4.1  Chloride 98 - 111 mmol/L 101  CO2 22 - 32 mmol/L 25  Calcium 8.9 - 10.3 mg/dL 9.0  Total Protein 6.5 - 8.1 g/dL  7.1  Total Bilirubin 0.3 - 1.2 mg/dL 0.8  Alkaline Phos 38 - 126 U/L 81  AST 15 - 41 U/L 28  ALT 0 - 44 U/L 31   CBC Latest Ref Rng & Units 06/26/2019  WBC 4.0 - 10.5 K/uL 6.9  Hemoglobin 12.0 - 15.0 g/dL 11.9(L)  Hematocrit 36.0 - 46.0 % 35.4(L)    Platelets 150 - 400 K/uL 142(L)    No images are attached to the encounter.  No results found.   Assessment and plan- Patient is a 73 y.o. female who presents to [facility] for dizziness and frequent falls x 3 days.  Small cell lung cancer: s/p carbo/etoposide/Tecentriq given November through January 2020.  Interim CT scan on 01/24/2019 revealed positive response to therapy with continued reduction in mediastinal adenopathy.  No residual measurable left lung mass.  Completed chest and whole brain radiation in May 2020.  Resumed maintenance Tecentriq on 04/25/2019.  Treatment held on 06/08/2019 for transaminitis.  Counts improved. She is scheduled for next Tecentriq infusion on 07/06/2019, MD assessment and palliative care consult.  Dizziness/Frequent Falls/AMS: s/p whole brain radiation which was completed in May 2020.  Admits to 3 falls since this past Friday without injury.  She states she trips and catches herself prior to hitting the ground or her husband catches her.  Patient appears dehydrated evident by orthostatic hypotension.  States she drinks plenty of fluids so unsure of how she has become dehydrated.  Unclear etiology.  This is likely multifactorial; d/t positive UTI, dehydration and prophylactic whole brain radiation.  She may require weekly or biweekly IV hydration with Tecentriq.   Plan: Stat labs.  Labs unremarkable. Vital signs.  Orthostatic hypotension from sitting to standing position.  Improved after IV fluids. Give 1 L NaCl IV.  Give 10 mg Decadron IV. Check UA/urine culture. (Positive for UTI).  Urine culture pending.  Disposition: RTC on Thursday for reassessment either in [facility] or by Dr. Tasia Catchings. RTC on 07/06/19 for assessment prior to Tecentriq infusion.  She also be seen by palliative care that day. Rx for Cipro 500 mg twice daily x7 days sent to pharmacy for UTI. Patient to purchase blood pressure cuff at drugstore.  She was instructed to hold BP medicine for a systolic blood  pressure less than 100.  Patient unsure if she can afford a blood pressure cuff.  I have reached out to [place] for assistance.   Visit Diagnosis No diagnosis found.  Patient expressed understanding and was in agreement with this plan. She also understands that She can call clinic at any time with any questions, concerns, or complaints.   Greater than 50% was spent in counseling and coordination of care with this patient including but not limited to discussion of the relevant topics above (See A&P) including, but not limited to diagnosis and management of acute and chronic medical conditions.   Thank you for allowing me to participate in the care of this very pleasant patient.    Jacquelin Hawking, NP [place] [facility] Cell - [phone] Pager- [phone] 06/26/2019 2:38 PM

## 2019-06-26 NOTE — Telephone Encounter (Signed)
Pt called in to report has been having frequent falls over the weekend due to loss of balance. Would like to know MD recommendations. Pt is concerned she is going to fall and injure herself. Per Dr. Tasia Catchings, pt may come in today to be seen in [facility]. Pt added for labs at 10:45am and to see Sonia Baller, NP at 11:15am. Pt made aware of appts. Verbalized understanding.

## 2019-06-27 MED ORDER — SEVOFLURANE IN SOLN
RESPIRATORY_TRACT | Status: AC
  Filled 2019-06-27: qty 250

## 2019-06-28 ENCOUNTER — Telehealth: Payer: Self-pay | Admitting: Oncology

## 2019-06-28 NOTE — Telephone Encounter (Signed)
Spoke to patient to follow-up on her symptoms.  Patient states she was feeling better and then approximately 30 minutes ago started to feel dizzy when standing.  She is taking her antibiotics as prescribed.  She has taken 3 tablets so far of her Cipro.  I tentatively set her up for a [facility] visit for tomorrow for additional IV fluids and assessment but she would like to hold off at this time and to wait and see how she feels in the morning.  I expressed the importance of hydration given diagnosis of UTI.  She states she is drinking as much water as she can.  Will touch base with patient tomorrow.  Faythe Casa, NP 06/28/2019 3:09 PM

## 2019-06-29 ENCOUNTER — Inpatient Hospital Stay: Payer: Medicare Other | Admitting: Oncology

## 2019-06-30 LAB — URINE CULTURE: Culture: >=100000 — AB

## 2019-07-06 ENCOUNTER — Inpatient Hospital Stay: Payer: Medicare Other | Admitting: Hospice and Palliative Medicine

## 2019-07-06 ENCOUNTER — Emergency Department
Admission: EM | Admit: 2019-07-06 | Discharge: 2019-07-06 | Disposition: A | Discharge status: Home / Self Care | Payer: Medicare Other | Attending: Emergency Medicine | Admitting: Emergency Medicine

## 2019-07-06 ENCOUNTER — Other Ambulatory Visit: Payer: Self-pay

## 2019-07-06 ENCOUNTER — Inpatient Hospital Stay (HOSPITAL_BASED_OUTPATIENT_CLINIC_OR_DEPARTMENT_OTHER): Payer: Medicare Other | Admitting: Oncology

## 2019-07-06 ENCOUNTER — Inpatient Hospital Stay: Payer: Medicare Other

## 2019-07-06 ENCOUNTER — Emergency Department: Payer: Medicare Other

## 2019-07-06 ENCOUNTER — Encounter: Payer: Self-pay | Admitting: Oncology

## 2019-07-06 VITALS — BP 101/73 | HR 106 | Temp 98.6°F

## 2019-07-06 DIAGNOSIS — J449 Chronic obstructive pulmonary disease, unspecified: Secondary | ICD-10-CM | POA: Insufficient documentation

## 2019-07-06 DIAGNOSIS — C3490 Malignant neoplasm of unspecified part of unspecified bronchus or lung: Secondary | ICD-10-CM | POA: Diagnosis not present

## 2019-07-06 DIAGNOSIS — Z03818 Encounter for observation for suspected exposure to other biological agents ruled out: Secondary | ICD-10-CM | POA: Diagnosis not present

## 2019-07-06 DIAGNOSIS — I25119 Atherosclerotic heart disease of native coronary artery with unspecified angina pectoris: Secondary | ICD-10-CM | POA: Insufficient documentation

## 2019-07-06 DIAGNOSIS — Z5112 Encounter for antineoplastic immunotherapy: Secondary | ICD-10-CM

## 2019-07-06 DIAGNOSIS — R51 Headache: Secondary | ICD-10-CM

## 2019-07-06 DIAGNOSIS — R070 Pain in throat: Secondary | ICD-10-CM | POA: Diagnosis not present

## 2019-07-06 DIAGNOSIS — Z85118 Personal history of other malignant neoplasm of bronchus and lung: Secondary | ICD-10-CM | POA: Diagnosis not present

## 2019-07-06 DIAGNOSIS — R0602 Shortness of breath: Secondary | ICD-10-CM

## 2019-07-06 DIAGNOSIS — Z79899 Other long term (current) drug therapy: Secondary | ICD-10-CM | POA: Diagnosis not present

## 2019-07-06 DIAGNOSIS — Z87891 Personal history of nicotine dependence: Secondary | ICD-10-CM | POA: Diagnosis not present

## 2019-07-06 DIAGNOSIS — E871 Hypo-osmolality and hyponatremia: Secondary | ICD-10-CM | POA: Diagnosis not present

## 2019-07-06 DIAGNOSIS — R05 Cough: Secondary | ICD-10-CM

## 2019-07-06 DIAGNOSIS — I252 Old myocardial infarction: Secondary | ICD-10-CM | POA: Diagnosis not present

## 2019-07-06 DIAGNOSIS — R5383 Other fatigue: Secondary | ICD-10-CM

## 2019-07-06 DIAGNOSIS — I10 Essential (primary) hypertension: Secondary | ICD-10-CM | POA: Insufficient documentation

## 2019-07-06 DIAGNOSIS — Z20828 Contact with and (suspected) exposure to other viral communicable diseases: Secondary | ICD-10-CM | POA: Diagnosis not present

## 2019-07-06 DIAGNOSIS — Z20822 Contact with and (suspected) exposure to covid-19: Secondary | ICD-10-CM

## 2019-07-06 DIAGNOSIS — J029 Acute pharyngitis, unspecified: Secondary | ICD-10-CM

## 2019-07-06 DIAGNOSIS — Z95828 Presence of other vascular implants and grafts: Secondary | ICD-10-CM

## 2019-07-06 DIAGNOSIS — E785 Hyperlipidemia, unspecified: Secondary | ICD-10-CM | POA: Diagnosis not present

## 2019-07-06 LAB — COMPREHENSIVE METABOLIC PANEL
ALT: 21 U/L (ref 0–44)
AST: 22 U/L (ref 15–41)
Albumin: 4 g/dL (ref 3.5–5.0)
Alkaline Phosphatase: 77 U/L (ref 38–126)
Anion gap: 9 (ref 5–15)
BUN: 12 mg/dL (ref 8–23)
CO2: 24 mmol/L (ref 22–32)
Calcium: 9.4 mg/dL (ref 8.9–10.3)
Chloride: 99 mmol/L (ref 98–111)
Creatinine, Ser: 0.75 mg/dL (ref 0.44–1.00)
GFR calc Af Amer: >60 mL/min (ref >60)
GFR calc non Af Amer: >60 mL/min (ref >60)
Glucose, Bld: 108 mg/dL — ABNORMAL HIGH (ref 70–99)
Potassium: 3.9 mmol/L (ref 3.5–5.1)
Sodium: 132 mmol/L — ABNORMAL LOW (ref 135–145)
Total Bilirubin: 0.7 mg/dL (ref 0.3–1.2)
Total Protein: 7.7 g/dL (ref 6.5–8.1)

## 2019-07-06 LAB — CBC WITH DIFFERENTIAL/PLATELET
Abs Immature Granulocytes: 0.04 10*3/uL (ref 0.00–0.07)
Basophils Absolute: 0 10*3/uL (ref 0.0–0.1)
Basophils Relative: 1 %
Eosinophils Absolute: 0.2 10*3/uL (ref 0.0–0.5)
Eosinophils Relative: 3 %
HCT: 37 % (ref 36.0–46.0)
Hemoglobin: 12.6 g/dL (ref 12.0–15.0)
Immature Granulocytes: 1 %
Lymphocytes Relative: 23 %
Lymphs Abs: 1.5 10*3/uL (ref 0.7–4.0)
MCH: 29.3 pg (ref 26.0–34.0)
MCHC: 34.1 g/dL (ref 30.0–36.0)
MCV: 86 fL (ref 80.0–100.0)
Monocytes Absolute: 1 10*3/uL (ref 0.1–1.0)
Monocytes Relative: 15 %
Neutro Abs: 3.6 10*3/uL (ref 1.7–7.7)
Neutrophils Relative %: 57 %
Platelets: 176 10*3/uL (ref 150–400)
RBC: 4.3 MIL/uL (ref 3.87–5.11)
RDW: 13.6 % (ref 11.5–15.5)
WBC: 6.3 10*3/uL (ref 4.0–10.5)
nRBC: 0 % (ref 0.0–0.2)

## 2019-07-06 LAB — SARS CORONAVIRUS 2 BY RT PCR (HOSPITAL ORDER, PERFORMED IN ~~LOC~~ HOSPITAL LAB): SARS Coronavirus 2: NEGATIVE

## 2019-07-06 LAB — TSH: TSH: 0.788 u[IU]/mL (ref 0.350–4.500)

## 2019-07-06 MED ORDER — HEPARIN SOD (PORK) LOCK FLUSH 100 UNIT/ML IV SOLN
500.0000 [IU] | Freq: Once | INTRAVENOUS | Status: AC
  Filled 2019-07-06: qty 5

## 2019-07-06 NOTE — Progress Notes (Signed)
Patient here today for follow up.  Patient states that she did not tell staff at screening table when arriving but she has SOB, cough, weakness, headache and sore throat,

## 2019-07-06 NOTE — ED Provider Notes (Signed)
[facility] Emergency Department Provider Note   ____________________________________________   First MD Initiated Contact with Patient 07/06/19 1121     (approximate)  I have reviewed the triage vital signs and the nursing notes.   HISTORY  Chief Complaint Covid Testing    HPI Kristi [name] is a 73 y.o. female patient here today for COVID test and chest x-ray.  Patient reported to oncology today for how major treatment and he had adventitious sounds in her lungs.  Oncology requested COVID testing and chest x-ray before receiving treatment today.         Past Medical History:  Diagnosis Date  . Acute MI, inferoposterior wall ([place]) 09/30/2014  . Claustrophobia   . Coronary artery disease   . Hypercholesteremia   . Hypertension   . MI, old   . 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 [facility]) 10/27/2018    Patient Active Problem List   Diagnosis Date Noted  . Cancer of hilus of left lung ([place]) 06/08/2019  . Dehydration 01/30/2019  . Acute pyelonephritis 01/24/2019  . Anemia due to antineoplastic chemotherapy 01/09/2019  . Encounter for antineoplastic chemotherapy 11/02/2018  . Cough 10/28/2018  . Dysphagia 10/28/2018  . Decrease in appetite 10/28/2018  . Goals of care, counseling/discussion 10/28/2018  . Small cell lung cancer ([place]) 10/27/2018  . Grade I diastolic dysfunction 49/17/9150  . Localized edema 08/29/2018  . Cholecystitis 08/13/2018  . COPD with exacerbation ([place]) 08/11/2018  . Osteopenia determined by x-ray 05/19/2018  . Prediabetes 02/08/2018  . Hx of fracture of radius 08/18/2016  . Vitamin D deficiency 01/07/2016  . Hyperlipidemia 01/02/2016  . Hypertension 01/02/2016  . Chronic pain 01/02/2016  . Obesity (BMI 30.0-34.9) 01/02/2016  . Coronary artery disease with stable angina pectoris ([place]) 01/02/2016  . IBS (irritable bowel syndrome) 01/02/2016  . FH: colon cancer 01/02/2016  . DDD (degenerative disc disease), cervical  01/02/2016  . Primary osteoarthritis of left knee 01/02/2016  . Tobacco use disorder 01/02/2016    Past Surgical History:  Procedure Laterality Date  . APPENDECTOMY     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  . BLADDER NECK SUSPENSION    . CHOLECYSTECTOMY N/A 08/14/2018   Procedure: LAPAROSCOPIC CHOLECYSTECTOMY;  Surgeon: Jules Husbands, MD;  Location: [facility] ORS;  Service: General;  Laterality: N/A;  . CHOLECYSTECTOMY  11/2018  . CORONARY STENT PLACEMENT    . ENDOBRONCHIAL ULTRASOUND N/A 10/21/2018   Procedure: ENDOBRONCHIAL ULTRASOUND;  Surgeon: Laverle Hobby, MD;  Location: [facility] ORS;  Service: Pulmonary;  Laterality: N/A;  . HERNIA REPAIR    . IR FLUORO GUIDE CV LINE RIGHT  12/19/2018  . LEFT HEART CATHETERIZATION WITH CORONARY ANGIOGRAM N/A 09/29/2014   Procedure: LEFT HEART CATHETERIZATION WITH CORONARY ANGIOGRAM;  Surgeon: Clent Demark, MD;  Location: [facility] CATH LAB;  Service: Cardiovascular;  Laterality: N/A;  . PORTA CATH INSERTION N/A 01/02/2019   Procedure: PORTA CATH INSERTION;  Surgeon: Algernon Huxley, MD;  Location: [place] CV LAB;  Service: Cardiovascular;  Laterality: N/A;  . PORTACATH PLACEMENT Right 10/28/2018   Procedure: INSERTION PORT-A-CATH;  Surgeon: Jules Husbands, MD;  Location: [facility] ORS;  Service: General;  Laterality: Right;    Prior to Admission medications   Medication Sig Start Date End Date Taking? Authorizing Provider  atorvastatin (LIPITOR) 80 MG tablet Take 0.5 tablets (40 mg total) by mouth daily at 6 PM. 01/02/16   Plonk, Gwyndolyn Saxon, MD  B Complex Vitamins (VITAMIN B COMPLEX PO) Take 1 tablet by mouth  daily.     [provider]  CALCIUM PO Take 1 tablet by mouth daily.     [provider]  Cholecalciferol (VITAMIN D) 2000 units CAPS Take 1 capsule (2,000 Units total) by mouth daily. 01/07/16   Plonk, Gwyndolyn Saxon, MD  dexamethasone (DECADRON) 4 MG tablet Take 1 tablet (4 mg total) by mouth daily. 03/30/19   [name], MD  diphenhydrAMINE-zinc  acetate (BENADRYL EXTRA STRENGTH) cream Apply 1 application topically 3 (three) times daily as needed for itching. 05/18/19   Earlie Server, MD  HYDROcodone-acetaminophen (NORCO/VICODIN) 5-325 MG tablet Take 1 tablet by mouth every 6 (six) hours as needed for moderate pain. 04/25/19   Earlie Server, MD  lidocaine-prilocaine (EMLA) cream Apply to affected area once 10/27/18   Earlie Server, MD  metoprolol tartrate (LOPRESSOR) 25 MG tablet Take 12.5 mg by mouth 2 (two) times daily.    [provider]  nitroGLYCERIN (NITROSTAT) 0.4 MG SL tablet Place 1 tablet (0.4 mg total) under the tongue every 5 (five) minutes x 3 doses as needed for chest pain. 10/02/14   Charolette Forward, MD  omeprazole (PRILOSEC) 40 MG capsule Take 1 capsule (40 mg total) by mouth daily. 02/22/19   Earlie Server, MD  ondansetron (ZOFRAN) 8 MG tablet Take 1 tablet (8 mg total) by mouth 2 (two) times daily as needed for refractory nausea / vomiting. Start on day 3 after carboplatin chemo. 10/27/18   Earlie Server, MD  oxyCODONE (OXYCONTIN) 10 mg 12 hr tablet Take 1 tablet (10 mg total) by mouth every 12 (twelve) hours. 06/05/19   Borders, Kirt Boys, NP  polyethylene glycol (MIRALAX / GLYCOLAX) packet Take 17 g by mouth daily as needed.     [provider]  prochlorperazine (COMPAZINE) 10 MG tablet TAKE 1 TABLET BY MOUTH EVERY 6 HOURS AS NEEDED FOR NAUSEA OR VOMITING 04/24/19   Earlie Server, MD  sertraline (ZOLOFT) 50 MG tablet Take 1.5 tablets (75 mg total) by mouth daily. 04/25/19   Borders, Kirt Boys, NP  triamterene-hydrochlorothiazide (DYAZIDE) 37.5-25 MG capsule Take 1 each (1 capsule total) by mouth daily as needed. 05/01/19   Glean Hess, MD    Allergies Patient has no known allergies.  Family History  Problem Relation Age of Onset  . Alzheimer's disease Mother   . Colon cancer Father   . Breast cancer Neg Hx     Social History Social History   Tobacco Use  . Smoking status: Former Smoker    Packs/day: 1.00    Years: 39.00    Pack  years: 39.00    Types: Cigarettes    Start date: 12/21/1978    Quit date: 10/16/2018    Years since quitting: 0.7  . Smokeless tobacco: Never Used  Substance Use Topics  . Alcohol use: Yes    Alcohol/week: 0.0 standard drinks    Comment: occassional - approx 1 every 2 weeks   . Drug use: No    Review of Systems  Constitutional: No fever/chills Eyes: No visual changes. ENT: No sore throat. Cardiovascular: Denies chest pain. Respiratory: Denies shortness of breath.  Lung cancer Gastrointestinal: No abdominal pain.  No nausea, no vomiting.  No diarrhea.  No constipation. Genitourinary: Negative for dysuria. Musculoskeletal: Negative for back pain. Skin: Negative for rash. Neurological: Negative for headaches, focal weakness or numbness. Endocrine:  Hyperlipidemia and hypertension. ___________________________________________   PHYSICAL EXAM:  VITAL SIGNS: ED Triage Vitals  Enc Vitals Group     BP 07/06/19 1054 109/78  Pulse Rate 07/06/19 1054 99     Resp 07/06/19 1054 17     Temp 07/06/19 1054 97.6 F (36.4 C)     Temp Source 07/06/19 1054 Oral     SpO2 07/06/19 1054 98 %     Weight 07/06/19 1054 146 lb (66.2 kg)     Height 07/06/19 1054 5\' 7"  (1.702 m)     Head Circumference --      Peak Flow --      Pain Score 07/06/19 1051 0     Pain Loc --      Pain Edu? --      Excl. in [facility]? --     Constitutional: Alert and oriented. Well appearing and in no acute distress. Neck: No stridor.   Hematological/Lymphatic/Immunilogical: No cervical lymphadenopathy. Cardiovascular: Normal rate, regular rhythm. Grossly normal heart sounds.  Good peripheral circulation. Respiratory: Normal respiratory effort.  No retractions. Lungs CTAB. Gastrointestinal: Soft and nontender. No distention. No abdominal bruits. No CVA tenderness. Genitourinary: Deferred Musculoskeletal: No lower extremity tenderness nor edema.  No joint effusions. Neurologic:  Normal speech and language. No gross  focal neurologic deficits are appreciated. No gait instability. Skin:  Skin is warm, dry and intact. No rash noted. Psychiatric: Mood and affect are normal. Speech and behavior are normal.  ____________________________________________   LABS (all labs ordered are listed, but only abnormal results are displayed)  Labs Reviewed  SARS CORONAVIRUS 2 (HOSPITAL ORDER, [place] [facility])   ____________________________________________  EKG   ____________________________________________  [place]  ED MD interpretation:    Official radiology report(s): Dg Chest 1 View  Result Date: 07/06/2019 CLINICAL DATA:  History of lung cancer. EXAM: CHEST  1 VIEW COMPARISON:  Radiographs of December 28, 2018. FINDINGS: The heart size and mediastinal contours are within normal limits. Right internal jugular Port-A-Cath is unchanged in position. Atherosclerosis is noted of thoracic aorta. Right lung is clear. Minimal left basilar subsegmental atelectasis or scarring is noted. No pneumothorax or pleural effusion is noted. The visualized skeletal structures are unremarkable. IMPRESSION: Minimal left basilar subsegmental atelectasis or scarring. Aortic Atherosclerosis (ICD10-I70.0). Electronically Signed   By: Marijo Conception M.D.   On: 07/06/2019 11:56    ____________________________________________   PROCEDURES  Procedure(s) performed (including Critical Care):  Procedures   ____________________________________________   INITIAL IMPRESSION / ASSESSMENT AND PLAN / ED COURSE  As part of my medical decision making, I reviewed the following data within the [place] was evaluated in Emergency Department on 07/06/2019 for the symptoms described in the history of present illness. She was evaluated in the context of the global COVID-19 pandemic, which necessitated consideration that the patient might be at risk for infection with the  SARS-CoV-2 virus that causes COVID-19. Institutional protocols and algorithms that pertain to the evaluation of patients at risk for COVID-19 are in a state of rapid change based on information released by regulatory bodies including the CDC and federal and state organizations. These policies and algorithms were followed during the patient's care in the ED.    Patient report for COVID testing per recommendation from her oncologist.  Patient will be notified telephonically of results.  Discussed chest x-ray with patient which is unremarkable.  Patient advised follow-up with oncology.   ____________________________________________   FINAL CLINICAL IMPRESSION(S) / ED DIAGNOSES  Final diagnoses:  Suspected Covid-19 Virus Infection     ED Discharge Orders  None       Note:  This document was prepared using Dragon voice recognition software and may include unintentional dictation errors.    Sable Feil, PA-C 07/06/19 1241    Delman Kitten, MD 08/04/19 1028

## 2019-07-06 NOTE — ED Triage Notes (Signed)
Pt states she is currently under going treatment for lung CA and today when she went for her maintenance treatment and they heard something in her lungs and wanted her to have xray and covid testing to r/o.the patient denies having any sx.[name] Kitchen

## 2019-07-06 NOTE — ED Notes (Signed)
See triage note  Was sent in by PCP for COVID testing    States she was scheduled for maintenance ca treatment  States she she had a sore throat

## 2019-07-06 NOTE — Discharge Instructions (Addendum)
You will be notified telephonically of your COVID results.

## 2019-07-06 NOTE — Progress Notes (Signed)
Hematology/Oncology Follow up note [facility] Telephone:([phone]) [phone] Fax:(336) [phone]   Patient Care Team: Glean Hess, MD as PCP - General (Internal Medicine) Charolette Forward, MD as Consulting Physician (Cardiology) Telford Nab, RN as Registered Nurse  REFERRING PROVIDER: Dr. Felicie Morn REASON FOR VISIT:  Follow-up for small cell lung cancer,   HISTORY OF PRESENTING ILLNESS:  Kristi [name] is a  73 y.o.  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  10/20/2018 CT chest with contrast showed large mediastinal mass involving both hilar, left greater than right, consistent with lung carcinoma, The mass causes narrowing of the left mainstem bronchus with resultant volume loss on the left and a mediastinal shift to the left.  Moderate size left pleural effusion. 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  # Nov 2019- Jan 2020 s/p 4 cycles of Carbo/Etoposide/Tecentriq #  01/24/2019 interim CT scan done which showed continued positive response to therapy with continued reduction in mediastinal adenopathy.  No residual measurable left lung mass. CT findings of acute emphysematous cystitis. Urology did not feel that patient has pyelonephritis and recommend treatment with antibiotics because patient's immunocompromise. Patient finished treatment.   # Chest and whole brain radiation finished in May 2020 # 04/25/2019 resume Tecentriq every 3 weeks.  INTERVAL HISTORY Kristi [name] is a 73 y.o. female who has above history reviewed by me today presents for evaluation prior to immunotherapy for treatment of extensive small cell lung cancer. Patient reports feels weak today. She has non productive cough, and mild shortness of breath. Breath is exacerbated with exertion.   Also headache and sore throat. She feels that swallowing  potassium pills are difficult.   Review of Systems  Constitutional: Positive for fatigue. Negative for appetite change, chills and fever.  HENT:   Negative for hearing loss and voice change.   Eyes: Negative for eye problems.  Respiratory: Negative for chest tightness, cough and shortness of breath.   Cardiovascular: Negative for chest pain.  Gastrointestinal: Negative for abdominal distention, abdominal pain, blood in stool, diarrhea and nausea.  Endocrine: Negative for hot flashes.  Genitourinary: Negative for difficulty urinating and frequency.   Musculoskeletal: Negative for arthralgias.  Skin: Negative for itching and rash.  Neurological: Negative for extremity weakness.  Hematological: Negative for adenopathy.  Psychiatric/Behavioral: Negative for confusion and depression. The patient is not nervous/anxious.      MEDICAL HISTORY:  Past Medical History:  Diagnosis Date  . Acute MI, inferoposterior wall ([place]) 09/30/2014  . Claustrophobia   . Coronary artery disease   . Hypercholesteremia   . Hypertension   . MI, old   . 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 [facility]) 10/27/2018    SURGICAL HISTORY: Past Surgical History:  Procedure Laterality Date  . APPENDECTOMY     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  . BLADDER NECK SUSPENSION    . CHOLECYSTECTOMY N/A 08/14/2018   Procedure: LAPAROSCOPIC CHOLECYSTECTOMY;  Surgeon: Jules Husbands, MD;  Location: [facility] ORS;  Service: General;  Laterality: N/A;  . CHOLECYSTECTOMY  11/2018  . CORONARY STENT PLACEMENT    . ENDOBRONCHIAL ULTRASOUND N/A 10/21/2018   Procedure: ENDOBRONCHIAL ULTRASOUND;  Surgeon: Laverle Hobby, MD;  Location: [facility] ORS;  Service: Pulmonary;  Laterality: N/A;  . HERNIA REPAIR    . IR FLUORO GUIDE CV LINE RIGHT  12/19/2018  . LEFT HEART CATHETERIZATION WITH CORONARY ANGIOGRAM N/A 09/29/2014  Procedure: LEFT HEART CATHETERIZATION WITH CORONARY ANGIOGRAM;  Surgeon: Clent Demark, MD;  Location: [facility] CATH LAB;  Service:  Cardiovascular;  Laterality: N/A;  . PORTA CATH INSERTION N/A 01/02/2019   Procedure: PORTA CATH INSERTION;  Surgeon: Algernon Huxley, MD;  Location: [facility] CV LAB;  Service: Cardiovascular;  Laterality: N/A;  . PORTACATH PLACEMENT Right 10/28/2018   Procedure: INSERTION PORT-A-CATH;  Surgeon: Jules Husbands, MD;  Location: [facility] ORS;  Service: General;  Laterality: Right;    SOCIAL HISTORY: Social History   Socioeconomic History  . Marital status: Married    Spouse name: [name]   . Number of children: 3  . Years of education: Not on file  . Highest education level: Not on file  Occupational History  . Occupation: Retired    Comment: Chief Technology Officer   Social Needs  . Financial resource strain: Not very hard  . Food insecurity    Worry: Not on file    Inability: Not on file  . Transportation needs    Medical: No    Non-medical: No  Tobacco Use  . Smoking status: Former Smoker    Packs/day: 1.00    Years: 39.00    Pack years: 39.00    Types: Cigarettes    Start date: 12/21/1978    Quit date: 10/16/2018    Years since quitting: 0.7  . Smokeless tobacco: Never Used  Substance and Sexual Activity  . Alcohol use: Yes    Alcohol/week: 0.0 standard drinks    Comment: occassional - approx 1 every 2 weeks   . Drug use: No  . Sexual activity: Yes    Birth control/protection: None  Lifestyle  . Physical activity    Days per week: 0 days    Minutes per session: Not on file  . Stress: To some extent  Relationships  . Social Herbalist on phone: Three times a week    Gets together: Once a week    Attends religious service: 1 to 4 times per year    Active member of club or organization: Not on file    Attends meetings of clubs or organizations: Not on file    Relationship status: Married  . Intimate partner violence    Fear of current or ex partner: No    Emotionally abused: No    Physically abused: No    Forced sexual activity: No  Other Topics Concern  . Not on  file  Social History Narrative  . Not on file    FAMILY HISTORY: Family History  Problem Relation Age of Onset  . Alzheimer's disease Mother   . Colon cancer Father   . Breast cancer Neg Hx     ALLERGIES:  has No Known Allergies.  MEDICATIONS:  Current Outpatient Medications  Medication Sig Dispense Refill  . atorvastatin (LIPITOR) 80 MG tablet Take 0.5 tablets (40 mg total) by mouth daily at 6 PM. 30 tablet 3  . B Complex Vitamins (VITAMIN B COMPLEX PO) Take 1 tablet by mouth daily.     [name] Kitchen CALCIUM PO Take 1 tablet by mouth daily.     . Cholecalciferol (VITAMIN D) 2000 units CAPS Take 1 capsule (2,000 Units total) by mouth daily. 30 capsule   . dexamethasone (DECADRON) 4 MG tablet Take 1 tablet (4 mg total) by mouth daily. 25 tablet 0  . diphenhydrAMINE-zinc acetate (BENADRYL EXTRA STRENGTH) cream Apply 1 application topically 3 (three) times daily as needed for itching. 28.4 g 0  .  HYDROcodone-acetaminophen (NORCO/VICODIN) 5-325 MG tablet Take 1 tablet by mouth every 6 (six) hours as needed for moderate pain. 30 tablet 0  . lidocaine-prilocaine (EMLA) cream Apply to affected area once 30 g 3  . metoprolol tartrate (LOPRESSOR) 25 MG tablet Take 12.5 mg by mouth 2 (two) times daily.    . nitroGLYCERIN (NITROSTAT) 0.4 MG SL tablet Place 1 tablet (0.4 mg total) under the tongue every 5 (five) minutes x 3 doses as needed for chest pain. 25 tablet 12  . omeprazole (PRILOSEC) 40 MG capsule Take 1 capsule (40 mg total) by mouth daily. 90 capsule 1  . ondansetron (ZOFRAN) 8 MG tablet Take 1 tablet (8 mg total) by mouth 2 (two) times daily as needed for refractory nausea / vomiting. Start on day 3 after carboplatin chemo. 30 tablet 1  . oxyCODONE (OXYCONTIN) 10 mg 12 hr tablet Take 1 tablet (10 mg total) by mouth every 12 (twelve) hours. 60 tablet 0  . polyethylene glycol (MIRALAX / GLYCOLAX) packet Take 17 g by mouth daily as needed.     . prochlorperazine (COMPAZINE) 10 MG tablet TAKE 1  TABLET BY MOUTH EVERY 6 HOURS AS NEEDED FOR NAUSEA OR VOMITING 270 tablet 1  . sertraline (ZOLOFT) 50 MG tablet Take 1.5 tablets (75 mg total) by mouth daily. 45 tablet 2  . triamterene-hydrochlorothiazide (DYAZIDE) 37.5-25 MG capsule Take 1 each (1 capsule total) by mouth daily as needed. 90 capsule 0   No current facility-administered medications for this visit.    Facility-Administered Medications Ordered in Other Visits  Medication Dose Route Frequency Provider Last Rate Last Dose  . heparin lock flush 100 unit/mL  500 Units Intravenous Once Earlie Server, MD      . heparin lock flush 100 unit/mL  500 Units Intravenous Once Earlie Server, MD      . sodium chloride flush (NS) 0.9 % injection 10 mL  10 mL Intravenous PRN Earlie Server, MD   10 mL at 01/09/19 0820  . sodium chloride flush (NS) 0.9 % injection 10 mL  10 mL Intravenous PRN Earlie Server, MD   10 mL at 01/10/19 1400     PHYSICAL EXAMINATION: ECOG PERFORMANCE STATUS: 1 - Symptomatic but completely ambulatory Vitals:   07/06/19 0956  BP: 101/73  Pulse: (!) 106  Temp: 98.6 F (37 C)   There were no vitals filed for this visit. Physical Exam  Constitutional: She is oriented to person, place, and time. No distress.  HENT:  Head: Normocephalic and atraumatic.  Nose: Nose normal.  Mouth/Throat: Oropharynx is clear and moist. No oropharyngeal exudate.  Eyes: Pupils are equal, round, and reactive to light. EOM are normal. No scleral icterus.  Neck: Normal range of motion. Neck supple.  Cardiovascular: Regular rhythm.  No murmur heard. tachycardia  Pulmonary/Chest: Effort normal. No respiratory distress. She has no rales. She exhibits no tenderness.  Decreased breath sounds bilaterally.  Abdominal: Soft. She exhibits no distension. There is no abdominal tenderness.  Musculoskeletal: Normal range of motion.        General: No edema.  Neurological: She is alert and oriented to person, place, and time. No cranial nerve deficit. She exhibits  normal muscle tone. Coordination normal.  Skin: Skin is warm and dry. She is not diaphoretic. No erythema.  Psychiatric: Affect normal.       LABORATORY DATA:  I have reviewed the data as listed Lab Results  Component Value Date   WBC 6.3 07/06/2019   HGB 12.6 07/06/2019  HCT 37.0 07/06/2019   MCV 86.0 07/06/2019   PLT 176 07/06/2019   Recent Labs    08/12/18 1438  06/15/19 0905 06/26/19 1048 07/06/19 0849  NA 131*   < > 135 135 132*  K 3.7   < > 3.9 4.1 3.9  CL 97*   < > 101 101 99  CO2 25   < > 25 25 24   GLUCOSE 122*   < > 109* 111* 108*  BUN 9   < > 16 15 12   CREATININE 0.74   < > 0.80 0.69 0.75  CALCIUM 9.1   < > 9.0 9.0 9.4  GFRNONAA >60   < > >60 >60 >60  GFRAA >60   < > >60 >60 >60  PROT 8.0   < > 6.9 7.1 7.7  ALBUMIN 4.3   < > 3.9 3.8 4.0  AST 33   < > 40 28 22  ALT 26   < > 62* 31 21  ALKPHOS 81   < > 98 81 77  BILITOT 0.6   < > 0.5 0.8 0.7  BILIDIR 0.2  --   --   --   --   IBILI 0.4  --   --   --   --    < > = values in this interval not displayed.    RADIOGRAPHIC STUDIES: I have personally reviewed the radiological images as listed and agreed with the findings in the report. 10/20/2018 CT chest w contrast . Large mediastinal mass involving both hila left much greater than right consistent with lung carcinoma possibly small cell lung carcinoma. This mass causes narrowing of the left mainstem bronchus with resultant volume loss on the left and mediastinal shift to the Left. 2. Moderate size left pleural effusion. 3. Coronary artery calcifications and moderate thoracic aortic atherosclerosis. PET 10/26/2018 . Large left lung mass is markedly hypermetabolic in the confluent lymphadenopathy in the mediastinum and in bilateral hilar regions also shows marked hypermetabolism. 2. Hypermetabolic metastatic lymphadenopathy in the right neck and supraclavicular region. 3. Hypermetabolic lymphadenopathy in the hepato duodenal ligament consistent with metastatic  disease. 4. Large hypermetabolic lesion posterior right acetabulum without correlate of CT finding. Features highly suspicious for bony metastatic disease. MRI brain 10/28/2018 1. No metastatic disease or acute intracranial abnormality identified. 2. 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  3. Moderately advanced but nonspecific cerebral white matter signal changes, most commonly due to chronic small vessel disease. Lesser signal changes in the deep gray matter and pons may also be small vessel related.  CT chest abdomen pelvis with contrast 12/15/2018 1 Marked response to therapy of thoracic adenopathy. 2. No new or progressive disease in the chest. 3. Coronary artery atherosclerosis. 4. 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 5. No new or progressive disease in the abdomen or pelvis 6. Pelvic floor laxity, cystocele with bladder wall thickening, suggesting a component of outlet obstruction. 7. Possible vague sclerosis within the posterior right acetabulum, in the region of hypermetabolism on prior PET. Likely an area of treated metastasis. 8. Central line tip in right brachiocephalic vein with surrounding occlusive thrombus. CT chest abdomen pelvis with contrast 01/24/2019 1. New extensive gas throughout the bladder wall and within the bladder lumen with underlying mild diffuse bladder wall thickening, compatible with acute emphysematous cystitis. 2. Two new subcentimeter low-attenuation lesions in the right kidney, nonspecific but worrisome for mild acute right pyelonephritis given the above bladder findings. No discrete/drainable renal abscess. No hydronephrosis. 3.  Continued positive response to therapy with continued reduction in mediastinal adenopathy. No residual measurable left lung mass.Stable faint sclerosis at the site of the hypermetabolic right acetabular osseous lesion. No new or progressive metastatic disease in the chest, abdomen or pelvis. 4. Small bowel  containing small periumbilical hernia without bowel complication at this time. 5. Aortic Atherosclerosis (ICD10-I70.0) and Emphysema (ICD10-J43.9).  05/05/2019 CT chest abdomen pelvis with contrast Near complete resolution of prior mediastinal lymphadenopathy.  Residual 12 mm short axis subcarinal node. Radiation changes in the lungs bilaterally.  Left lower lobe/lingular scarring.  No suspicious pulmonary nodules.  No evidence of metastatic disease in the abdomen or pelvis.   ASSESSMENT & PLAN:  1. SOB (shortness of breath)   2. Small cell lung cancer ([place])   3. Sore throat   4. Encounter for antineoplastic immunotherapy    #Extensive small cell lung cancer S/p 4 cycles of Carboplatin, Etoposide and Tecentriq.   Status post chest and whole brain radiation. Currently on Tecentriq maintenance. Hold treatment due to acute illness.   # Sore threat and shortness of breath,  Discussed with patient that I would send her to ER for Covid 19 screening and also CXR.   # Hyponatremia, likely due to poor po intake.  Will proceed with  IVF 1 L normal saline on 07/07/2019.   Labs are reviewed. COVID 19 negative. Likely viral URI. Supportive care.    Follow up in one week for re-evaluation .  The patient knows to call the clinic with any problems questions or concerns.  Earlie Server, MD, PhD

## 2019-07-06 NOTE — ED Notes (Signed)
States she wishes to go home nd have the results called to her  Provider aware

## 2019-07-07 ENCOUNTER — Ambulatory Visit: Payer: Medicare Other

## 2019-07-07 ENCOUNTER — Other Ambulatory Visit: Payer: Self-pay | Admitting: *Deleted

## 2019-07-07 ENCOUNTER — Telehealth: Payer: Self-pay | Admitting: Oncology

## 2019-07-07 DIAGNOSIS — C3490 Malignant neoplasm of unspecified part of unspecified bronchus or lung: Secondary | ICD-10-CM

## 2019-07-07 MED ORDER — OXYCODONE HCL ER 10 MG PO T12A: 10.0000 mg | EXTENDED_RELEASE_TABLET | Freq: Two times a day (BID) | ORAL | 0 refills | Status: DC

## 2019-07-07 NOTE — Telephone Encounter (Signed)
Received triage message from patient to call about appt. Pt was scheduled for IVF in [facility] today, but she never came. Not sure if those are the appts she was referring too.

## 2019-07-07 NOTE — Telephone Encounter (Signed)
Pt requests refill of oxycontin. Last fill was on 06/05/2019.

## 2019-07-10 ENCOUNTER — Other Ambulatory Visit: Payer: Self-pay

## 2019-07-10 ENCOUNTER — Encounter: Payer: Self-pay | Admitting: Oncology

## 2019-07-10 ENCOUNTER — Inpatient Hospital Stay: Payer: Medicare Other

## 2019-07-10 ENCOUNTER — Inpatient Hospital Stay (HOSPITAL_BASED_OUTPATIENT_CLINIC_OR_DEPARTMENT_OTHER): Payer: Medicare Other | Admitting: Oncology

## 2019-07-10 VITALS — BP 99/67 | HR 99 | Temp 98.2°F | Wt 146.0 lb

## 2019-07-10 DIAGNOSIS — R42 Dizziness and giddiness: Secondary | ICD-10-CM | POA: Diagnosis not present

## 2019-07-10 DIAGNOSIS — Z95828 Presence of other vascular implants and grafts: Secondary | ICD-10-CM

## 2019-07-10 DIAGNOSIS — I951 Orthostatic hypotension: Secondary | ICD-10-CM | POA: Diagnosis not present

## 2019-07-10 DIAGNOSIS — R531 Weakness: Secondary | ICD-10-CM

## 2019-07-10 DIAGNOSIS — N390 Urinary tract infection, site not specified: Secondary | ICD-10-CM | POA: Diagnosis not present

## 2019-07-10 DIAGNOSIS — R296 Repeated falls: Secondary | ICD-10-CM | POA: Diagnosis not present

## 2019-07-10 DIAGNOSIS — C3492 Malignant neoplasm of unspecified part of left bronchus or lung: Secondary | ICD-10-CM | POA: Diagnosis not present

## 2019-07-10 DIAGNOSIS — R5383 Other fatigue: Secondary | ICD-10-CM

## 2019-07-10 DIAGNOSIS — Z5112 Encounter for antineoplastic immunotherapy: Secondary | ICD-10-CM | POA: Diagnosis not present

## 2019-07-10 DIAGNOSIS — E860 Dehydration: Secondary | ICD-10-CM | POA: Diagnosis not present

## 2019-07-10 DIAGNOSIS — C3490 Malignant neoplasm of unspecified part of unspecified bronchus or lung: Secondary | ICD-10-CM | POA: Diagnosis not present

## 2019-07-10 LAB — CBC WITH DIFFERENTIAL/PLATELET
Abs Immature Granulocytes: 0.03 10*3/uL (ref 0.00–0.07)
Basophils Absolute: 0 10*3/uL (ref 0.0–0.1)
Basophils Relative: 1 %
Eosinophils Absolute: 0.3 10*3/uL (ref 0.0–0.5)
Eosinophils Relative: 4 %
HCT: 35.7 % — ABNORMAL LOW (ref 36.0–46.0)
Hemoglobin: 12.1 g/dL (ref 12.0–15.0)
Immature Granulocytes: 0 %
Lymphocytes Relative: 18 %
Lymphs Abs: 1.2 10*3/uL (ref 0.7–4.0)
MCH: 29.3 pg (ref 26.0–34.0)
MCHC: 33.9 g/dL (ref 30.0–36.0)
MCV: 86.4 fL (ref 80.0–100.0)
Monocytes Absolute: 0.9 10*3/uL (ref 0.1–1.0)
Monocytes Relative: 13 %
Neutro Abs: 4.3 10*3/uL (ref 1.7–7.7)
Neutrophils Relative %: 64 %
Platelets: 154 10*3/uL (ref 150–400)
RBC: 4.13 MIL/uL (ref 3.87–5.11)
RDW: 13.5 % (ref 11.5–15.5)
WBC: 6.7 10*3/uL (ref 4.0–10.5)
nRBC: 0 % (ref 0.0–0.2)

## 2019-07-10 LAB — COMPREHENSIVE METABOLIC PANEL
ALT: 16 U/L (ref 0–44)
AST: 17 U/L (ref 15–41)
Albumin: 3.9 g/dL (ref 3.5–5.0)
Alkaline Phosphatase: 76 U/L (ref 38–126)
Anion gap: 10 (ref 5–15)
BUN: 12 mg/dL (ref 8–23)
CO2: 25 mmol/L (ref 22–32)
Calcium: 9.2 mg/dL (ref 8.9–10.3)
Chloride: 100 mmol/L (ref 98–111)
Creatinine, Ser: 0.78 mg/dL (ref 0.44–1.00)
GFR calc Af Amer: >60 mL/min (ref >60)
GFR calc non Af Amer: >60 mL/min (ref >60)
Glucose, Bld: 108 mg/dL — ABNORMAL HIGH (ref 70–99)
Potassium: 4 mmol/L (ref 3.5–5.1)
Sodium: 135 mmol/L (ref 135–145)
Total Bilirubin: 0.9 mg/dL (ref 0.3–1.2)
Total Protein: 7.1 g/dL (ref 6.5–8.1)

## 2019-07-10 MED ORDER — SODIUM CHLORIDE 0.9% FLUSH
10.0000 mL | Freq: Once | INTRAVENOUS | Status: AC
  Administered 2019-07-10: 10 mL via INTRAVENOUS
  Filled 2019-07-10: qty 10

## 2019-07-10 MED ORDER — DEXAMETHASONE SODIUM PHOSPHATE 10 MG/ML IJ SOLN
10.0000 mg | Freq: Once | INTRAMUSCULAR | Status: AC
  Administered 2019-07-10: 11:00:00 10 mg via INTRAVENOUS

## 2019-07-10 MED ORDER — HEPARIN SOD (PORK) LOCK FLUSH 100 UNIT/ML IV SOLN
500.0000 [IU] | Freq: Once | INTRAVENOUS | Status: AC
  Administered 2019-07-10: 12:00:00 500 [IU] via INTRAVENOUS

## 2019-07-10 MED ORDER — SODIUM CHLORIDE 0.9 % IV SOLN
INTRAVENOUS | Status: DC
  Administered 2019-07-10 (×4): via INTRAVENOUS
  Filled 2019-07-10 (×2): qty 250

## 2019-07-10 NOTE — Patient Instructions (Signed)
It was nice to see you today.  I am sorry that you are not feeling well.  Today you are found to be slightly dehydrated.  We gave you 1 L normal saline and 10 mg of IV steroids.  This hopefully will make you feel a little bit better.  I encourage you to continue to drink plenty of fluids.  Try keeping a diary of the amount of fluids you are actually drinking.  You are scheduled to come back to clinic in just a few days.  You are welcome to call if you need anything additional.  I have given you some electrolyte replacement packets.  Try to drink 1 of these daily to help with hydration.  Please call with any questions or concerns.  Faythe Casa, NP 07/10/2019 12:25 PM  Dehydration, Adult  Dehydration is when there is not enough fluid or water in your body. This happens when you lose more fluids than you take in. Dehydration can range from mild to very bad. It should be treated right away to keep it from getting very bad. Symptoms of mild dehydration may include:  Thirst.  Dry lips.  Slightly dry mouth.  Dry, warm skin.  Dizziness. Symptoms of moderate dehydration may include:  Very dry mouth.  Muscle cramps.  Dark pee (urine). Pee may be the color of tea.  Your body making less pee.  Your eyes making fewer tears.  Heartbeat that is uneven or faster than normal (palpitations).  Headache.  Light-headedness, especially when you stand up from sitting.  Fainting (syncope). Symptoms of very bad dehydration may include:  Changes in skin, such as: ? Cold and clammy skin. ? Blotchy (mottled) or pale skin. ? Skin that does not quickly return to normal after being lightly pinched and let go (poor skin turgor).  Changes in body fluids, such as: ? Feeling very thirsty. ? Your eyes making fewer tears. ? Not sweating when body temperature is high, such as in hot weather. ? Your body making very little pee.  Changes in vital signs, such as: ? Weak pulse. ? Pulse that is  more than 100 beats a minute when you are sitting still. ? Fast breathing. ? Low blood pressure.  Other changes, such as: ? Sunken eyes. ? Cold hands and feet. ? Confusion. ? Lack of energy (lethargy). ? Trouble waking up from sleep. ? Short-term weight loss. ? Unconsciousness. Follow these instructions at home:   If told by your doctor, drink an ORS: ? Make an ORS by using instructions on the package. ? Start by drinking small amounts, about  cup (120 mL) every 5-10 minutes. ? Slowly drink more until you have had the amount that your doctor said to have.  Drink enough clear fluid to keep your pee clear or pale yellow. If you were told to drink an ORS, finish the ORS first, then start slowly drinking clear fluids. Drink fluids such as: ? Water. Do not drink only water by itself. Doing that can make the salt (sodium) level in your body get too low (hyponatremia). ? Ice chips. ? Fruit juice that you have added water to (diluted). ? Low-calorie sports drinks.  Avoid: ? Alcohol. ? Drinks that have a lot of sugar. These include high-calorie sports drinks, fruit juice that does not have water added, and soda. ? Caffeine. ? Foods that are greasy or have a lot of fat or sugar.  Take over-the-counter and prescription medicines only as told by your doctor.  Do not  take salt tablets. Doing that can make the salt level in your body get too high (hypernatremia).  Eat foods that have minerals (electrolytes). Examples include bananas, oranges, potatoes, tomatoes, and spinach.  Keep all follow-up visits as told by your doctor. This is important. Contact a doctor if:  You have belly (abdominal) pain that: ? Gets worse. ? Stays in one area (localizes).  You have a rash.  You have a stiff neck.  You get angry or annoyed more easily than normal (irritability).  You are more sleepy than normal.  You have a harder time waking up than normal.  You feel: ? Weak. ? Dizzy. ? Very  thirsty.  You have peed (urinated) only a small amount of very dark pee during 6-8 hours. Get help right away if:  You have symptoms of very bad dehydration.  You cannot drink fluids without throwing up (vomiting).  Your symptoms get worse with treatment.  You have a fever.  You have a very bad headache.  You are throwing up or having watery poop (diarrhea) and it: ? Gets worse. ? Does not go away.  You have blood or something green (bile) in your throw-up.  You have blood in your poop (stool). This may cause poop to look black and tarry.  You have not peed in 6-8 hours.  You pass out (faint).  Your heart rate when you are sitting still is more than 100 beats a minute.  You have trouble breathing. This information is not intended to replace advice given to you by your health care provider. Make sure you discuss any questions you have with your health care provider. Document Released: 10/03/2009 Document Revised: 11/19/2017 Document Reviewed: 01/31/2016 Elsevier Patient Education  2020 [employer].

## 2019-07-10 NOTE — Progress Notes (Signed)
Symptom Management Consult note [facility]  [phone] [phone]  Patient Care Team: Glean Hess, MD as PCP - General (Internal Medicine) Charolette Forward, MD as Consulting Physician (Cardiology) Telford Nab, RN as Registered Nurse   Name of the patient: Kristi [name]  [id number]  1946-03-08   Date of visit: 07/10/2019   Diagnosis- small cell lung cancer    Chief complaint/ Reason for visit-dizziness/dehydration  Heme/Onc history:  Oncology History Overview Note  [name] is a  73 y.o.  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   10/20/2018 CT chest with contrast showed large mediastinal mass involving both hilar, left greater than right, consistent with lung carcinoma, The mass causes narrowing of the left mainstem bronchus with resultant volume loss on the left and a mediastinal shift to the left.  Moderate size left pleural effusion. 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   # Nov 2019- Jan 2020 s/p 4 cycles of Carbo/Etoposide/Tecentriq #  01/24/2019 interim CT scan done which showed continued positive response to therapy with continued reduction in mediastinal adenopathy.  No residual measurable left lung mass. CT findings of acute emphysematous cystitis. Urology did not feel that patient has pyelonephritis and recommend treatment with antibiotics because patient's immunocompromise. Patient finished treatment.    # Chest and whole brain radiation finished in May 2020 # 04/25/2019 resume Tecentriq every 3 weeks.   Small cell lung cancer ([place])  10/27/2018 Initial Diagnosis   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 [facility])   11/02/2018 -  Chemotherapy   The patient had palonosetron (ALOXI) injection 0.25 mg, 0.25 mg, Intravenous,  Once, 4 of 4 cycles Administration: 0.25 mg (11/02/2018),  0.25 mg (11/23/2018), 0.25 mg (12/19/2018), 0.25 mg (01/09/2019) pegfilgrastim-cbqv (UDENYCA) injection 6 mg, 6 mg, Subcutaneous, Once, 4 of 4 cycles Administration: 6 mg (11/07/2018), 6 mg (11/28/2018), 6 mg (12/23/2018), 6 mg (01/12/2019) CARBOplatin (PARAPLATIN) 390 mg in sodium chloride 0.9 % 250 mL chemo infusion, 390 mg (100 % of original dose 394.5 mg), Intravenous,  Once, 4 of 4 cycles Dose modification:   (original dose 394.5 mg, Cycle 1) Administration: 390 mg (11/02/2018), 390 mg (11/23/2018), 390 mg (12/19/2018), 390 mg (01/09/2019) etoposide (VEPESID) 180 mg in sodium chloride 0.9 % 500 mL chemo infusion, 100 mg/m2 = 180 mg, Intravenous,  Once, 4 of 4 cycles Administration: 180 mg (11/02/2018), 180 mg (11/03/2018), 180 mg (11/04/2018), 180 mg (11/23/2018), 180 mg (11/24/2018), 180 mg (11/25/2018), 180 mg (12/19/2018), 180 mg (12/20/2018), 180 mg (12/22/2018), 180 mg (01/09/2019), 180 mg (01/10/2019), 180 mg (01/11/2019) fosaprepitant (EMEND) 150 mg, dexamethasone (DECADRON) 12 mg in sodium chloride 0.9 % 145 mL IVPB, , Intravenous,  Once, 4 of 4 cycles Administration:  (11/02/2018),  (11/23/2018),  (12/19/2018),  (01/09/2019) atezolizumab (TECENTRIQ) 1,200 mg in sodium chloride 0.9 % 250 mL chemo infusion, 1,200 mg, Intravenous, Once, 6 of 7 cycles Administration: 1,200 mg (11/23/2018), 1,200 mg (12/19/2018), 1,200 mg (01/09/2019), 1,200 mg (04/24/2019), 1,200 mg (05/18/2019), 1,200 mg (06/15/2019)  for chemotherapy treatment.       Interval history-[name] presents to [facility] for complaints of weakness fatigue and feeling like she has no strength.  She has tried to do additional exercising at home to increase her stamina but has been unsuccessful.  States "she has nowhere to walk".  Her granddaughter has taken her to the [place] to walk  around which she thought helped.  Her appetite has improved.  She complains of upper and lower midline back pain intermittently relieved with prescribed  narcotics.  She denies constipation or diarrhea.  She denies recent fever or illness, easy bleeding or bruising.  She denies chest pain, nausea, vomiting or shortness of breath.  Patient was evaluated in [facility] on 06/26/2019 for dizziness and falling.  Work-up included labs, orthostatic vital signs (+) and IV fluids including IV Decadron.  We also checked a urinalysis and urine culture and she was positive for a UTI.  She was started on Cipro 500 mg twice daily x7 days.   She was evaluated by her oncologist Dr. Tasia Catchings on 07/06/2019 where she complained of shortness of breath and sore throat. Cycle 5 chemotherapy held. She was sent to the emergency room for COVID-19 testing and chest x-ray.  Both were negative.  She was given 1 L normal saline.  Thought to be viral.  ECOG FS:1 - Symptomatic but completely ambulatory  Review of systems- Review of Systems  Constitutional: Positive for malaise/fatigue. Negative for chills, fever and weight loss.  HENT: Negative for congestion, ear pain and tinnitus.   Eyes: Negative.  Negative for blurred vision and double vision.  Respiratory: Negative.  Negative for cough, sputum production and shortness of breath.   Cardiovascular: Negative.  Negative for chest pain, palpitations and leg swelling.  Gastrointestinal: Negative.  Negative for abdominal pain, constipation, diarrhea, nausea and vomiting.  Genitourinary: Negative for dysuria, frequency and urgency.  Musculoskeletal: Negative for back pain and falls.  Skin: Negative.  Negative for rash.  Neurological: Positive for dizziness and weakness. Negative for headaches.  Endo/Heme/Allergies: Negative.  Does not bruise/bleed easily.  Psychiatric/Behavioral: Negative.  Negative for depression. The patient is not nervous/anxious and does not have insomnia.      Current treatment- s/p 4 cycles carbo/etoposide/Tecentriq.  Completed prophylactic whole brain radiation in May 2020.  Currently on maintenance Tecentriq every 3  weeks.  Last treatment given on 06/15/2019.  Cycle 5 held due to acute illness.  No Known Allergies   Past Medical History:  Diagnosis Date  . Acute MI, inferoposterior wall ([place]) 09/30/2014  . Claustrophobia   . Coronary artery disease   . Hypercholesteremia   . Hypertension   . MI, old   . 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 [facility]) 10/27/2018     Past Surgical History:  Procedure Laterality Date  . APPENDECTOMY     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  . BLADDER NECK SUSPENSION    . CHOLECYSTECTOMY N/A 08/14/2018   Procedure: LAPAROSCOPIC CHOLECYSTECTOMY;  Surgeon: Jules Husbands, MD;  Location: [facility] ORS;  Service: General;  Laterality: N/A;  . CHOLECYSTECTOMY  11/2018  . CORONARY STENT PLACEMENT    . ENDOBRONCHIAL ULTRASOUND N/A 10/21/2018   Procedure: ENDOBRONCHIAL ULTRASOUND;  Surgeon: Laverle Hobby, MD;  Location: [facility] ORS;  Service: Pulmonary;  Laterality: N/A;  . HERNIA REPAIR    . IR FLUORO GUIDE CV LINE RIGHT  12/19/2018  . LEFT HEART CATHETERIZATION WITH CORONARY ANGIOGRAM N/A 09/29/2014   Procedure: LEFT HEART CATHETERIZATION WITH CORONARY ANGIOGRAM;  Surgeon: Clent Demark, MD;  Location: [facility] CATH LAB;  Service: Cardiovascular;  Laterality: N/A;  . PORTA CATH INSERTION N/A 01/02/2019   Procedure: PORTA CATH INSERTION;  Surgeon: Algernon Huxley, MD;  Location: [place] CV LAB;  Service: Cardiovascular;  Laterality: N/A;  . PORTACATH PLACEMENT Right 10/28/2018   Procedure: INSERTION PORT-A-CATH;  Surgeon: Jules Husbands, MD;  Location: [facility] ORS;  Service: General;  Laterality: Right;    Social History   Socioeconomic History  . Marital status: Married    Spouse name: [name]   . Number of children: 3  . Years of education: Not on file  . Highest education level: Not on file  Occupational History  . Occupation: Retired    Comment: Chief Technology Officer   Social Needs  . Financial resource strain: Not very hard  . Food insecurity    Worry: Not on file    Inability: Not on  file  . Transportation needs    Medical: No    Non-medical: No  Tobacco Use  . Smoking status: Former Smoker    Packs/day: 1.00    Years: 39.00    Pack years: 39.00    Types: Cigarettes    Start date: 12/21/1978    Quit date: 10/16/2018    Years since quitting: 0.7  . Smokeless tobacco: Never Used  Substance and Sexual Activity  . Alcohol use: Yes    Alcohol/week: 0.0 standard drinks    Comment: occassional - approx 1 every 2 weeks   . Drug use: No  . Sexual activity: Yes    Birth control/protection: None  Lifestyle  . Physical activity    Days per week: 0 days    Minutes per session: Not on file  . Stress: To some extent  Relationships  . Social Herbalist on phone: Three times a week    Gets together: Once a week    Attends religious service: 1 to 4 times per year    Active member of club or organization: Not on file    Attends meetings of clubs or organizations: Not on file    Relationship status: Married  . Intimate partner violence    Fear of current or ex partner: No    Emotionally abused: No    Physically abused: No    Forced sexual activity: No  Other Topics Concern  . Not on file  Social History Narrative  . Not on file    Family History  Problem Relation Age of Onset  . Alzheimer's disease Mother   . Colon cancer Father   . Breast cancer Neg Hx      Current Outpatient Medications:  .  atorvastatin (LIPITOR) 80 MG tablet, Take 0.5 tablets (40 mg total) by mouth daily at 6 PM., Disp: 30 tablet, Rfl: 3 .  B Complex Vitamins (VITAMIN B COMPLEX PO), Take 1 tablet by mouth daily. , Disp: , Rfl:  .  CALCIUM PO, Take 1 tablet by mouth daily. , Disp: , Rfl:  .  Cholecalciferol (VITAMIN D) 2000 units CAPS, Take 1 capsule (2,000 Units total) by mouth daily., Disp: 30 capsule, Rfl:  .  dexamethasone (DECADRON) 4 MG tablet, Take 1 tablet (4 mg total) by mouth daily., Disp: 25 tablet, Rfl: 0 .  diphenhydrAMINE-zinc acetate (BENADRYL EXTRA STRENGTH)  cream, Apply 1 application topically 3 (three) times daily as needed for itching., Disp: 28.4 g, Rfl: 0 .  HYDROcodone-acetaminophen (NORCO/VICODIN) 5-325 MG tablet, Take 1 tablet by mouth every 6 (six) hours as needed for moderate pain., Disp: 30 tablet, Rfl: 0 .  lidocaine-prilocaine (EMLA) cream, Apply to affected area once, Disp: 30 g, Rfl: 3 .  nitroGLYCERIN (NITROSTAT) 0.4 MG SL tablet, Place 1 tablet (0.4 mg total) under the tongue every 5 (five) minutes x 3 doses as needed for chest pain., Disp: 25 tablet, Rfl: 12 .  omeprazole (PRILOSEC) 40 MG capsule, Take 1 capsule (40 mg total) by mouth daily., Disp: 90 capsule, Rfl: 1 .  ondansetron (ZOFRAN) 8 MG tablet, Take 1 tablet (8 mg total) by mouth 2 (two) times daily as needed for refractory nausea / vomiting. Start on day 3 after carboplatin chemo., Disp: 30 tablet, Rfl: 1 .  oxyCODONE (OXYCONTIN) 10 mg 12 hr tablet, Take 1 tablet (10 mg total) by mouth every 12 (twelve) hours., Disp: 60 tablet, Rfl: 0 .  polyethylene glycol (MIRALAX / GLYCOLAX) packet, Take 17 g by mouth daily as needed. , Disp: , Rfl:  .  prochlorperazine (COMPAZINE) 10 MG tablet, TAKE 1 TABLET BY MOUTH EVERY 6 HOURS AS NEEDED FOR NAUSEA OR VOMITING, Disp: 270 tablet, Rfl: 1 .  sertraline (ZOLOFT) 50 MG tablet, Take 1.5 tablets (75 mg total) by mouth daily., Disp: 45 tablet, Rfl: 2 .  triamterene-hydrochlorothiazide (DYAZIDE) 37.5-25 MG capsule, Take 1 each (1 capsule total) by mouth daily as needed., Disp: 90 capsule, Rfl: 0 .  metoprolol tartrate (LOPRESSOR) 25 MG tablet, Take 12.5 mg by mouth 2 (two) times daily., Disp: , Rfl:  No current facility-administered medications for this visit.   Facility-Administered Medications Ordered in Other Visits:  .  heparin lock flush 100 unit/mL, 500 Units, Intravenous, Once, Earlie Server, MD .  heparin lock flush 100 unit/mL, 500 Units, Intravenous, Once, Earlie Server, MD .  sodium chloride flush (NS) 0.9 % injection 10 mL, 10 mL,  Intravenous, PRN, Earlie Server, MD, 10 mL at 01/09/19 0820 .  sodium chloride flush (NS) 0.9 % injection 10 mL, 10 mL, Intravenous, PRN, Earlie Server, MD, 10 mL at 01/10/19 1400  Physical exam:  Vitals:   07/10/19 1041  BP: 99/67  Pulse: 99  Temp: 98.2 F (36.8 C)  TempSrc: Tympanic  SpO2: 96%  Weight: 146 lb (66.2 kg)   Physical Exam Constitutional:      Appearance: Normal appearance. She is ill-appearing.  HENT:     Head: Normocephalic and atraumatic.  Eyes:     Pupils: Pupils are equal, round, and reactive to light.  Neck:     Musculoskeletal: Normal range of motion.  Cardiovascular:     Rate and Rhythm: Normal rate and regular rhythm.     Heart sounds: Normal heart sounds. No murmur.  Pulmonary:     Effort: Pulmonary effort is normal.     Breath sounds: Normal breath sounds. No wheezing.  Abdominal:     General: Bowel sounds are normal. There is no distension.     Palpations: Abdomen is soft.     Tenderness: There is no abdominal tenderness.  Musculoskeletal: Normal range of motion.  Skin:    General: Skin is warm and dry.     Coloration: Skin is pale.     Findings: No rash.  Neurological:     Mental Status: She is alert and oriented to person, place, and time.  Psychiatric:        Judgment: Judgment normal.      CMP Latest Ref Rng & Units 07/10/2019  Glucose 70 - 99 mg/dL 108(H)  BUN 8 - 23 mg/dL 12  Creatinine 0.44 - 1.00 mg/dL 0.78  Sodium 135 - 145 mmol/L 135  Potassium 3.5 - 5.1 mmol/L 4.0  Chloride 98 - 111 mmol/L 100  CO2 22 - 32 mmol/L 25  Calcium 8.9 - 10.3 mg/dL 9.2  Total Protein 6.5 - 8.1 g/dL 7.1  Total Bilirubin 0.3 - 1.2 mg/dL 0.9  Alkaline Phos  38 - 126 U/L 76  AST 15 - 41 U/L 17  ALT 0 - 44 U/L 16   CBC Latest Ref Rng & Units 07/10/2019  WBC 4.0 - 10.5 K/uL 6.7  Hemoglobin 12.0 - 15.0 g/dL 12.1  Hematocrit 36.0 - 46.0 % 35.7(L)  Platelets 150 - 400 K/uL 154    No images are attached to the encounter.  Dg Chest 1 View  Result Date:  07/06/2019 CLINICAL DATA:  History of lung cancer. EXAM: CHEST  1 VIEW COMPARISON:  Radiographs of December 28, 2018. FINDINGS: The heart size and mediastinal contours are within normal limits. Right internal jugular Port-A-Cath is unchanged in position. Atherosclerosis is noted of thoracic aorta. Right lung is clear. Minimal left basilar subsegmental atelectasis or scarring is noted. No pneumothorax or pleural effusion is noted. The visualized skeletal structures are unremarkable. IMPRESSION: Minimal left basilar subsegmental atelectasis or scarring. Aortic Atherosclerosis (ICD10-I70.0). Electronically Signed   By: Marijo Conception M.D.   On: 07/06/2019 11:56     Assessment and plan- Patient is a 73 y.o. female who presents to [facility] for dizziness and weakness since beginning chemotherapy.  Small cell lung cancer: Status post 4 cycles carbo/etoposide/Tecentriq.  Interim CT scan on 01/24/2019 revealed positive response to therapy with continued reduction in mediastinal adenopathy.  Completed chest and whole brain radiation in May 2020.  Resumed maintenance Tecentriq on 04/25/2019.  Treatment held on 06/08/2023 transaminitis and again on 07/06/2019 for acute respiratory issues.  Scheduled to return to clinic on 07/13/2023 assessment prior to chemotherapy.  Dizziness/fatigue: Multifactorial; she is status post whole brain radiation completed in May 2020.  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 She cannot remember how much or how often she is drinking or eating.  I have suggested that she keep a diary of fluid and food intake to keep up with calories and liquids.  She is in agreeance. Dehydration is likely also exacerbated by chemotherapy and recent UTI treatment.  Plan: Stat labs.  Unremarkable. Orthostatic vital signs.  (+).  Improved with IV fluids. Give 1 L NaCl.  Give 10 mg IV Decadron.   Disposition: RTC as scheduled on 07/13/2019 for  assessment prior to cycle 5 carbo/etoposide/Tecentriq.   Visit Diagnosis 1. Dehydration   2. Small cell lung cancer [facility])     Patient expressed understanding and was in agreement with this plan. She also understands that She can call clinic at any time with any questions, concerns, or complaints.   Greater than 50% was spent in counseling and coordination of care with this patient including but not limited to discussion of the relevant topics above (See A&P) including, but not limited to diagnosis and management of acute and chronic medical conditions.   Thank you for allowing me to participate in the care of this very pleasant patient.    Jacquelin Hawking, NP [place] [facility] Cell - [phone] Pager- [phone] 07/12/2019 12:14 PM

## 2019-07-13 ENCOUNTER — Inpatient Hospital Stay: Payer: Medicare Other

## 2019-07-13 ENCOUNTER — Inpatient Hospital Stay (HOSPITAL_BASED_OUTPATIENT_CLINIC_OR_DEPARTMENT_OTHER): Payer: Medicare Other | Admitting: Oncology

## 2019-07-13 ENCOUNTER — Other Ambulatory Visit: Payer: Self-pay

## 2019-07-13 ENCOUNTER — Encounter: Payer: Self-pay | Admitting: Oncology

## 2019-07-13 VITALS — BP 106/73 | HR 90 | Temp 98.1°F | Resp 16 | Wt 147.1 lb

## 2019-07-13 DIAGNOSIS — C3490 Malignant neoplasm of unspecified part of unspecified bronchus or lung: Secondary | ICD-10-CM

## 2019-07-13 DIAGNOSIS — Z5112 Encounter for antineoplastic immunotherapy: Secondary | ICD-10-CM

## 2019-07-13 DIAGNOSIS — C3492 Malignant neoplasm of unspecified part of left bronchus or lung: Secondary | ICD-10-CM | POA: Diagnosis not present

## 2019-07-13 DIAGNOSIS — Z87891 Personal history of nicotine dependence: Secondary | ICD-10-CM

## 2019-07-13 DIAGNOSIS — F329 Major depressive disorder, single episode, unspecified: Secondary | ICD-10-CM

## 2019-07-13 DIAGNOSIS — Z95828 Presence of other vascular implants and grafts: Secondary | ICD-10-CM

## 2019-07-13 DIAGNOSIS — R296 Repeated falls: Secondary | ICD-10-CM | POA: Diagnosis not present

## 2019-07-13 DIAGNOSIS — R413 Other amnesia: Secondary | ICD-10-CM

## 2019-07-13 DIAGNOSIS — E860 Dehydration: Secondary | ICD-10-CM | POA: Diagnosis not present

## 2019-07-13 DIAGNOSIS — I951 Orthostatic hypotension: Secondary | ICD-10-CM | POA: Diagnosis not present

## 2019-07-13 DIAGNOSIS — R5383 Other fatigue: Secondary | ICD-10-CM

## 2019-07-13 DIAGNOSIS — R5382 Chronic fatigue, unspecified: Secondary | ICD-10-CM

## 2019-07-13 DIAGNOSIS — N390 Urinary tract infection, site not specified: Secondary | ICD-10-CM | POA: Diagnosis not present

## 2019-07-13 LAB — CBC WITH DIFFERENTIAL/PLATELET
Abs Immature Granulocytes: 0.04 10*3/uL (ref 0.00–0.07)
Basophils Absolute: 0 10*3/uL (ref 0.0–0.1)
Basophils Relative: 1 %
Eosinophils Absolute: 0.2 10*3/uL (ref 0.0–0.5)
Eosinophils Relative: 3 %
HCT: 35.3 % — ABNORMAL LOW (ref 36.0–46.0)
Hemoglobin: 11.9 g/dL — ABNORMAL LOW (ref 12.0–15.0)
Immature Granulocytes: 1 %
Lymphocytes Relative: 20 %
Lymphs Abs: 1.1 10*3/uL (ref 0.7–4.0)
MCH: 29.5 pg (ref 26.0–34.0)
MCHC: 33.7 g/dL (ref 30.0–36.0)
MCV: 87.6 fL (ref 80.0–100.0)
Monocytes Absolute: 0.9 10*3/uL (ref 0.1–1.0)
Monocytes Relative: 17 %
Neutro Abs: 3.2 10*3/uL (ref 1.7–7.7)
Neutrophils Relative %: 58 %
Platelets: 161 10*3/uL (ref 150–400)
RBC: 4.03 MIL/uL (ref 3.87–5.11)
RDW: 13.6 % (ref 11.5–15.5)
WBC: 5.4 10*3/uL (ref 4.0–10.5)
nRBC: 0 % (ref 0.0–0.2)

## 2019-07-13 LAB — COMPREHENSIVE METABOLIC PANEL
ALT: 17 U/L (ref 0–44)
AST: 16 U/L (ref 15–41)
Albumin: 4 g/dL (ref 3.5–5.0)
Alkaline Phosphatase: 78 U/L (ref 38–126)
Anion gap: 8 (ref 5–15)
BUN: 14 mg/dL (ref 8–23)
CO2: 25 mmol/L (ref 22–32)
Calcium: 9.3 mg/dL (ref 8.9–10.3)
Chloride: 101 mmol/L (ref 98–111)
Creatinine, Ser: 0.66 mg/dL (ref 0.44–1.00)
GFR calc Af Amer: >60 mL/min (ref >60)
GFR calc non Af Amer: >60 mL/min (ref >60)
Glucose, Bld: 106 mg/dL — ABNORMAL HIGH (ref 70–99)
Potassium: 4 mmol/L (ref 3.5–5.1)
Sodium: 134 mmol/L — ABNORMAL LOW (ref 135–145)
Total Bilirubin: 0.8 mg/dL (ref 0.3–1.2)
Total Protein: 7.1 g/dL (ref 6.5–8.1)

## 2019-07-13 LAB — T4, FREE: Free T4: 1.24 ng/dL — ABNORMAL HIGH (ref 0.61–1.12)

## 2019-07-13 MED ORDER — SODIUM CHLORIDE 0.9 % IV SOLN
1200.0000 mg | Freq: Once | INTRAVENOUS | Status: AC
  Administered 2019-07-13: 1200 mg via INTRAVENOUS
  Filled 2019-07-13: qty 20

## 2019-07-13 MED ORDER — HEPARIN SOD (PORK) LOCK FLUSH 100 UNIT/ML IV SOLN
500.0000 [IU] | Freq: Once | INTRAVENOUS | Status: AC
  Administered 2019-07-13: 500 [IU] via INTRAVENOUS

## 2019-07-13 MED ORDER — SODIUM CHLORIDE 0.9% FLUSH
10.0000 mL | Freq: Once | INTRAVENOUS | Status: AC
  Administered 2019-07-13: 10 mL via INTRAVENOUS
  Filled 2019-07-13: qty 10

## 2019-07-13 MED ORDER — SODIUM CHLORIDE 0.9 % IV SOLN
Freq: Once | INTRAVENOUS | Status: AC
  Administered 2019-07-13: 10:00:00 via INTRAVENOUS
  Filled 2019-07-13: qty 250

## 2019-07-13 MED ORDER — DEXAMETHASONE SODIUM PHOSPHATE 10 MG/ML IJ SOLN
10.0000 mg | Freq: Once | INTRAMUSCULAR | Status: AC
  Administered 2019-07-13: 10 mg via INTRAVENOUS
  Filled 2019-07-13: qty 1

## 2019-07-13 MED ORDER — HEPARIN SOD (PORK) LOCK FLUSH 100 UNIT/ML IV SOLN
INTRAVENOUS | Status: AC
  Filled 2019-07-13: qty 5

## 2019-07-13 NOTE — Progress Notes (Signed)
Patient is feeling better this week.  She does still feel very tired.

## 2019-07-13 NOTE — Progress Notes (Signed)
Hematology/Oncology Follow up note [facility] [phone] [phone]   Patient Care Team: Glean Hess, MD as PCP - General (Internal Medicine) Charolette Forward, MD as Consulting Physician (Cardiology) Telford Nab, RN as Registered Nurse  REFERRING PROVIDER: Dr. Felicie Morn REASON FOR VISIT:  Follow-up for small cell lung cancer,   HISTORY OF PRESENTING ILLNESS:  Kristi [name] is a  73 y.o.  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  10/20/2018 CT chest with contrast showed large mediastinal mass involving both hilar, left greater than right, consistent with lung carcinoma, The mass causes narrowing of the left mainstem bronchus with resultant volume loss on the left and a mediastinal shift to the left.  Moderate size left pleural effusion. 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  # Nov 2019- Jan 2020 s/p 4 cycles of Carbo/Etoposide/Tecentriq #  01/24/2019 interim CT scan done which showed continued positive response to therapy with continued reduction in mediastinal adenopathy.  No residual measurable left lung mass. CT findings of acute emphysematous cystitis. Urology did not feel that patient has pyelonephritis and recommend treatment with antibiotics because patient's immunocompromise. Patient finished treatment.   # Chest and whole brain radiation finished in May 2020 # 04/25/2019 resume Tecentriq every 3 weeks.  INTERVAL HISTORY Kristi [name] is a 73 y.o. female who has above history reviewed by me today presents for evaluation prior to immunotherapy for treatment of extensive small cell lung cancer. Patient reports feeling pretty well today.  Denies any dizziness, fever, cough, dysuria. Mood has been stable. Chronic fatigue. Has been quite forgetful. No skin rash.    Review of Systems   Constitutional: Positive for fatigue. Negative for appetite change, chills and fever.  HENT:   Negative for hearing loss and voice change.   Eyes: Negative for eye problems.  Respiratory: Negative for chest tightness, cough and shortness of breath.   Cardiovascular: Negative for chest pain.  Gastrointestinal: Negative for abdominal distention, abdominal pain, blood in stool, diarrhea and nausea.  Endocrine: Negative for hot flashes.  Genitourinary: Negative for difficulty urinating and frequency.   Musculoskeletal: Negative for arthralgias.  Skin: Negative for itching and rash.  Neurological: Negative for extremity weakness.  Hematological: Negative for adenopathy.  Psychiatric/Behavioral: Negative for confusion and depression. The patient is not nervous/anxious.        Forgetful     MEDICAL HISTORY:  Past Medical History:  Diagnosis Date  . Acute MI, inferoposterior wall ([place]) 09/30/2014  . Claustrophobia   . Coronary artery disease   . Hypercholesteremia   . Hypertension   . MI, old   . 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 [facility]) 10/27/2018    SURGICAL HISTORY: Past Surgical History:  Procedure Laterality Date  . APPENDECTOMY     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  . BLADDER NECK SUSPENSION    . CHOLECYSTECTOMY N/A 08/14/2018   Procedure: LAPAROSCOPIC CHOLECYSTECTOMY;  Surgeon: Jules Husbands, MD;  Location: [facility] ORS;  Service: General;  Laterality: N/A;  . CHOLECYSTECTOMY  11/2018  . CORONARY STENT PLACEMENT    . ENDOBRONCHIAL ULTRASOUND N/A 10/21/2018   Procedure: ENDOBRONCHIAL ULTRASOUND;  Surgeon: Laverle Hobby, MD;  Location: [facility] ORS;  Service: Pulmonary;  Laterality: N/A;  . HERNIA REPAIR    . IR FLUORO GUIDE CV LINE RIGHT  12/19/2018  . LEFT HEART CATHETERIZATION WITH CORONARY ANGIOGRAM N/A 09/29/2014   Procedure:  LEFT HEART CATHETERIZATION WITH CORONARY ANGIOGRAM;  Surgeon: Clent Demark, MD;  Location: [facility] CATH LAB;  Service: Cardiovascular;  Laterality: N/A;  .  PORTA CATH INSERTION N/A 01/02/2019   Procedure: PORTA CATH INSERTION;  Surgeon: Algernon Huxley, MD;  Location: [facility] CV LAB;  Service: Cardiovascular;  Laterality: N/A;  . PORTACATH PLACEMENT Right 10/28/2018   Procedure: INSERTION PORT-A-CATH;  Surgeon: Jules Husbands, MD;  Location: [facility] ORS;  Service: General;  Laterality: Right;    SOCIAL HISTORY: Social History   Socioeconomic History  . Marital status: Married    Spouse name: [name]   . Number of children: 3  . Years of education: Not on file  . Highest education level: Not on file  Occupational History  . Occupation: Retired    Comment: Chief Technology Officer   Social Needs  . Financial resource strain: Not very hard  . Food insecurity    Worry: Not on file    Inability: Not on file  . Transportation needs    Medical: No    Non-medical: No  Tobacco Use  . Smoking status: Former Smoker    Packs/day: 1.00    Years: 39.00    Pack years: 39.00    Types: Cigarettes    Start date: 12/21/1978    Quit date: 10/16/2018    Years since quitting: 0.7  . Smokeless tobacco: Never Used  Substance and Sexual Activity  . Alcohol use: Yes    Alcohol/week: 0.0 standard drinks    Comment: occassional - approx 1 every 2 weeks   . Drug use: No  . Sexual activity: Yes    Birth control/protection: None  Lifestyle  . Physical activity    Days per week: 0 days    Minutes per session: Not on file  . Stress: To some extent  Relationships  . Social Herbalist on phone: Three times a week    Gets together: Once a week    Attends religious service: 1 to 4 times per year    Active member of club or organization: Not on file    Attends meetings of clubs or organizations: Not on file    Relationship status: Married  . Intimate partner violence    Fear of current or ex partner: No    Emotionally abused: No    Physically abused: No    Forced sexual activity: No  Other Topics Concern  . Not on file  Social History Narrative  . Not  on file    FAMILY HISTORY: Family History  Problem Relation Age of Onset  . Alzheimer's disease Mother   . Colon cancer Father   . Breast cancer Neg Hx     ALLERGIES:  has No Known Allergies.  MEDICATIONS:  Current Outpatient Medications  Medication Sig Dispense Refill  . atorvastatin (LIPITOR) 80 MG tablet Take 0.5 tablets (40 mg total) by mouth daily at 6 PM. 30 tablet 3  . B Complex Vitamins (VITAMIN B COMPLEX PO) Take 1 tablet by mouth daily.     [name] Kitchen CALCIUM PO Take 1 tablet by mouth daily.     . Cholecalciferol (VITAMIN D) 2000 units CAPS Take 1 capsule (2,000 Units total) by mouth daily. 30 capsule   . dexamethasone (DECADRON) 4 MG tablet Take 1 tablet (4 mg total) by mouth daily. 25 tablet 0  . diphenhydrAMINE-zinc acetate (BENADRYL EXTRA STRENGTH) cream Apply 1 application topically 3 (three) times daily as needed for itching. 28.4 g 0  .  HYDROcodone-acetaminophen (NORCO/VICODIN) 5-325 MG tablet Take 1 tablet by mouth every 6 (six) hours as needed for moderate pain. 30 tablet 0  . lidocaine-prilocaine (EMLA) cream Apply to affected area once 30 g 3  . nitroGLYCERIN (NITROSTAT) 0.4 MG SL tablet Place 1 tablet (0.4 mg total) under the tongue every 5 (five) minutes x 3 doses as needed for chest pain. 25 tablet 12  . omeprazole (PRILOSEC) 40 MG capsule Take 1 capsule (40 mg total) by mouth daily. 90 capsule 1  . ondansetron (ZOFRAN) 8 MG tablet Take 1 tablet (8 mg total) by mouth 2 (two) times daily as needed for refractory nausea / vomiting. Start on day 3 after carboplatin chemo. 30 tablet 1  . oxyCODONE (OXYCONTIN) 10 mg 12 hr tablet Take 1 tablet (10 mg total) by mouth every 12 (twelve) hours. 60 tablet 0  . polyethylene glycol (MIRALAX / GLYCOLAX) packet Take 17 g by mouth daily as needed.     . prochlorperazine (COMPAZINE) 10 MG tablet TAKE 1 TABLET BY MOUTH EVERY 6 HOURS AS NEEDED FOR NAUSEA OR VOMITING 270 tablet 1  . sertraline (ZOLOFT) 50 MG tablet Take 1.5 tablets (75 mg  total) by mouth daily. 45 tablet 2  . metoprolol tartrate (LOPRESSOR) 25 MG tablet Take 12.5 mg by mouth 2 (two) times daily.    [name] Kitchen triamterene-hydrochlorothiazide (DYAZIDE) 37.5-25 MG capsule Take 1 each (1 capsule total) by mouth daily as needed. (Patient not taking: Reported on 07/13/2019) 90 capsule 0   No current facility-administered medications for this visit.    Facility-Administered Medications Ordered in Other Visits  Medication Dose Route Frequency Provider Last Rate Last Dose  . heparin lock flush 100 unit/mL  500 Units Intravenous Once Earlie Server, MD      . heparin lock flush 100 unit/mL  500 Units Intravenous Once Earlie Server, MD      . sodium chloride flush (NS) 0.9 % injection 10 mL  10 mL Intravenous PRN Earlie Server, MD   10 mL at 01/09/19 0820  . sodium chloride flush (NS) 0.9 % injection 10 mL  10 mL Intravenous PRN Earlie Server, MD   10 mL at 01/10/19 1400     PHYSICAL EXAMINATION: ECOG PERFORMANCE STATUS: 1 - Symptomatic but completely ambulatory Vitals:   07/13/19 0910  BP: 106/73  Pulse: 90  Resp: 16  Temp: 98.1 F (36.7 C)   Filed Weights   07/13/19 0910  Weight: 147 lb 1.6 oz (66.7 kg)   Physical Exam  Constitutional: She is oriented to person, place, and time. No distress.  HENT:  Head: Normocephalic and atraumatic.  Nose: Nose normal.  Mouth/Throat: Oropharynx is clear and moist. No oropharyngeal exudate.  Eyes: Pupils are equal, round, and reactive to light. EOM are normal. No scleral icterus.  Neck: Normal range of motion. Neck supple.  Cardiovascular: Normal rate and regular rhythm.  No murmur heard. Pulmonary/Chest: Effort normal. No respiratory distress. She has no rales. She exhibits no tenderness.  Decreased breath sounds bilaterally.  Abdominal: Soft. She exhibits no distension. There is no abdominal tenderness.  Musculoskeletal: Normal range of motion.        General: No edema.  Neurological: She is alert and oriented to person, place, and time. No  cranial nerve deficit. She exhibits normal muscle tone. Coordination normal.  Skin: Skin is warm and dry. She is not diaphoretic. No erythema.  Psychiatric: Affect normal.       LABORATORY DATA:  I have reviewed the data as listed  Lab Results  Component Value Date   WBC 5.4 07/13/2019   HGB 11.9 (L) 07/13/2019   HCT 35.3 (L) 07/13/2019   MCV 87.6 07/13/2019   PLT 161 07/13/2019   Recent Labs    08/12/18 1438  07/06/19 0849 07/10/19 1010 07/13/19 0837  NA 131*   < > 132* 135 134*  K 3.7   < > 3.9 4.0 4.0  CL 97*   < > 99 100 101  CO2 25   < > 24 25 25   GLUCOSE 122*   < > 108* 108* 106*  BUN 9   < > 12 12 14   CREATININE 0.74   < > 0.75 0.78 0.66  CALCIUM 9.1   < > 9.4 9.2 9.3  GFRNONAA >60   < > >60 >60 >60  GFRAA >60   < > >60 >60 >60  PROT 8.0   < > 7.7 7.1 7.1  ALBUMIN 4.3   < > 4.0 3.9 4.0  AST 33   < > 22 17 16   ALT 26   < > 21 16 17   ALKPHOS 81   < > 77 76 78  BILITOT 0.6   < > 0.7 0.9 0.8  BILIDIR 0.2  --   --   --   --   IBILI 0.4  --   --   --   --    < > = values in this interval not displayed.    RADIOGRAPHIC STUDIES: I have personally reviewed the radiological images as listed and agreed with the findings in the report. 10/20/2018 CT chest w contrast . Large mediastinal mass involving both hila left much greater than right consistent with lung carcinoma possibly small cell lung carcinoma. This mass causes narrowing of the left mainstem bronchus with resultant volume loss on the left and mediastinal shift to the Left. 2. Moderate size left pleural effusion. 3. Coronary artery calcifications and moderate thoracic aortic atherosclerosis. PET 10/26/2018 . Large left lung mass is markedly hypermetabolic in the confluent lymphadenopathy in the mediastinum and in bilateral hilar regions also shows marked hypermetabolism. 2. Hypermetabolic metastatic lymphadenopathy in the right neck and supraclavicular region. 3. Hypermetabolic lymphadenopathy in the hepato duodenal  ligament consistent with metastatic disease. 4. Large hypermetabolic lesion posterior right acetabulum without correlate of CT finding. Features highly suspicious for bony metastatic disease. MRI brain 10/28/2018 1. No metastatic disease or acute intracranial abnormality identified. 2. 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  3. Moderately advanced but nonspecific cerebral white matter signal changes, most commonly due to chronic small vessel disease. Lesser signal changes in the deep gray matter and pons may also be small vessel related.  CT chest abdomen pelvis with contrast 12/15/2018 1 Marked response to therapy of thoracic adenopathy. 2. No new or progressive disease in the chest. 3. Coronary artery atherosclerosis. 4. 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 5. No new or progressive disease in the abdomen or pelvis 6. Pelvic floor laxity, cystocele with bladder wall thickening, suggesting a component of outlet obstruction. 7. Possible vague sclerosis within the posterior right acetabulum, in the region of hypermetabolism on prior PET. Likely an area of treated metastasis. 8. Central line tip in right brachiocephalic vein with surrounding occlusive thrombus. CT chest abdomen pelvis with contrast 01/24/2019 1. New extensive gas throughout the bladder wall and within the bladder lumen with underlying mild diffuse bladder wall thickening, compatible with acute emphysematous cystitis. 2. Two new subcentimeter low-attenuation lesions in the right kidney,  nonspecific but worrisome for mild acute right pyelonephritis given the above bladder findings. No discrete/drainable renal abscess. No hydronephrosis. 3. Continued positive response to therapy with continued reduction in mediastinal adenopathy. No residual measurable left lung mass.Stable faint sclerosis at the site of the hypermetabolic right acetabular osseous lesion. No new or progressive metastatic disease in the chest,  abdomen or pelvis. 4. Small bowel containing small periumbilical hernia without bowel complication at this time. 5. Aortic Atherosclerosis (ICD10-I70.0) and Emphysema (ICD10-J43.9).  05/05/2019 CT chest abdomen pelvis with contrast Near complete resolution of prior mediastinal lymphadenopathy.  Residual 12 mm short axis subcarinal node. Radiation changes in the lungs bilaterally.  Left lower lobe/lingular scarring.  No suspicious pulmonary nodules.  No evidence of metastatic disease in the abdomen or pelvis.   ASSESSMENT & PLAN:  1. Small cell lung cancer ([place])   2. Encounter for antineoplastic immunotherapy   3. Other fatigue   4. Port-A-Cath in place    #Extensive small cell lung cancer S/p 4 cycles of Carboplatin, Etoposide and Tecentriq.   Status post chest and whole brain radiation. Currently on Tecentriq maintenance. Labs are reviewed and discussed with patient. Transaminitis has resolved. Counts are acceptable to proceed with Tecentriq today. I would obtain surveillance CT scanning prior to next visit.  #Chronic fatigue, multifactorial. TSH is stable. Free T4 1.24.  Will check cortisol level  #Forgetfulness, likely secondary to whole brain radiation. Depression has been well controlled.  Continue Zoloft. Follow-up with palliative care.  Follow up in 3 weeks for eval with next cycle of tecentriq.  Orders Placed This Encounter  Procedures  . CT Chest W Contrast    Standing Status:   Future    Standing Expiration Date:   07/12/2020    Order Specific Question:   ** REASON FOR EXAM (FREE TEXT)    Answer:   follow up    Order Specific Question:   If indicated for the ordered procedure, I authorize the administration of contrast media per Radiology protocol    Answer:   Yes    Order Specific Question:   Preferred imaging location?    Answer:   [facility]    Order Specific Question:   Radiology Contrast Protocol - do NOT remove file path    Answer:    [email or web address]  . CT Abdomen Pelvis W Contrast    Standing Status:   Future    Standing Expiration Date:   07/12/2020    Order Specific Question:   ** REASON FOR EXAM (FREE TEXT)    Answer:   follow up    Order Specific Question:   If indicated for the ordered procedure, I authorize the administration of contrast media per Radiology protocol    Answer:   Yes    Order Specific Question:   Preferred imaging location?    Answer:   [facility]    Order Specific Question:   Is Oral Contrast requested for this exam?    Answer:   Yes, Per Radiology protocol    Order Specific Question:   Radiology Contrast Protocol - do NOT remove file path    Answer:   [email or web address]     Earlie Server, MD, PhD

## 2019-07-18 ENCOUNTER — Ambulatory Visit: Payer: Medicare Other

## 2019-07-18 ENCOUNTER — Ambulatory Visit: Payer: Medicare Other | Admitting: Oncology

## 2019-07-18 ENCOUNTER — Other Ambulatory Visit: Payer: Medicare Other

## 2019-07-27 ENCOUNTER — Ambulatory Visit
Admission: RE | Admit: 2019-07-27 | Discharge: 2019-07-27 | Disposition: A | Discharge status: Home / Self Care | Payer: Medicare Other | Source: Ambulatory Visit | Attending: Oncology | Admitting: Oncology

## 2019-07-27 ENCOUNTER — Other Ambulatory Visit: Payer: Self-pay

## 2019-07-27 DIAGNOSIS — K439 Ventral hernia without obstruction or gangrene: Secondary | ICD-10-CM | POA: Diagnosis not present

## 2019-07-27 DIAGNOSIS — C3490 Malignant neoplasm of unspecified part of unspecified bronchus or lung: Secondary | ICD-10-CM | POA: Diagnosis not present

## 2019-07-27 DIAGNOSIS — I700 Atherosclerosis of aorta: Secondary | ICD-10-CM | POA: Diagnosis not present

## 2019-07-27 MED ORDER — IOHEXOL 300 MG/ML  SOLN
85.0000 mL | Freq: Once | INTRAMUSCULAR | Status: AC | PRN
  Administered 2019-07-27: 09:00:00 85 mL via INTRAVENOUS

## 2019-07-28 ENCOUNTER — Other Ambulatory Visit: Payer: Self-pay | Admitting: Oncology

## 2019-08-01 ENCOUNTER — Other Ambulatory Visit: Payer: Self-pay | Admitting: Oncology

## 2019-08-01 MED ORDER — SERTRALINE HCL 50 MG PO TABS: 75.0000 mg | ORAL_TABLET | Freq: Every day | ORAL | 2 refills | Status: DC

## 2019-08-03 ENCOUNTER — Inpatient Hospital Stay: Payer: Medicare Other

## 2019-08-03 ENCOUNTER — Inpatient Hospital Stay (HOSPITAL_BASED_OUTPATIENT_CLINIC_OR_DEPARTMENT_OTHER): Payer: Medicare Other | Admitting: Oncology

## 2019-08-03 ENCOUNTER — Inpatient Hospital Stay: Payer: Medicare Other | Attending: Oncology

## 2019-08-03 ENCOUNTER — Other Ambulatory Visit: Payer: Self-pay

## 2019-08-03 ENCOUNTER — Telehealth: Payer: Self-pay

## 2019-08-03 ENCOUNTER — Encounter: Payer: Self-pay | Admitting: Oncology

## 2019-08-03 VITALS — BP 116/82 | HR 87 | Temp 97.8°F | Resp 16 | Wt 145.3 lb

## 2019-08-03 DIAGNOSIS — C3490 Malignant neoplasm of unspecified part of unspecified bronchus or lung: Secondary | ICD-10-CM | POA: Insufficient documentation

## 2019-08-03 DIAGNOSIS — Z5112 Encounter for antineoplastic immunotherapy: Secondary | ICD-10-CM | POA: Insufficient documentation

## 2019-08-03 DIAGNOSIS — Z95828 Presence of other vascular implants and grafts: Secondary | ICD-10-CM

## 2019-08-03 DIAGNOSIS — R5383 Other fatigue: Secondary | ICD-10-CM

## 2019-08-03 DIAGNOSIS — R413 Other amnesia: Secondary | ICD-10-CM | POA: Diagnosis not present

## 2019-08-03 LAB — CBC WITH DIFFERENTIAL/PLATELET
Abs Immature Granulocytes: 0.02 10*3/uL (ref 0.00–0.07)
Basophils Absolute: 0 10*3/uL (ref 0.0–0.1)
Basophils Relative: 0 %
Eosinophils Absolute: 0.2 10*3/uL (ref 0.0–0.5)
Eosinophils Relative: 5 %
HCT: 36.1 % (ref 36.0–46.0)
Hemoglobin: 12.3 g/dL (ref 12.0–15.0)
Immature Granulocytes: 0 %
Lymphocytes Relative: 24 %
Lymphs Abs: 1.3 10*3/uL (ref 0.7–4.0)
MCH: 29.7 pg (ref 26.0–34.0)
MCHC: 34.1 g/dL (ref 30.0–36.0)
MCV: 87.2 fL (ref 80.0–100.0)
Monocytes Absolute: 0.7 10*3/uL (ref 0.1–1.0)
Monocytes Relative: 14 %
Neutro Abs: 2.9 10*3/uL (ref 1.7–7.7)
Neutrophils Relative %: 57 %
Platelets: 176 10*3/uL (ref 150–400)
RBC: 4.14 MIL/uL (ref 3.87–5.11)
RDW: 13.8 % (ref 11.5–15.5)
WBC: 5.2 10*3/uL (ref 4.0–10.5)
nRBC: 0 % (ref 0.0–0.2)

## 2019-08-03 LAB — COMPREHENSIVE METABOLIC PANEL
ALT: 13 U/L (ref 0–44)
AST: 16 U/L (ref 15–41)
Albumin: 4 g/dL (ref 3.5–5.0)
Alkaline Phosphatase: 70 U/L (ref 38–126)
Anion gap: 10 (ref 5–15)
BUN: 10 mg/dL (ref 8–23)
CO2: 24 mmol/L (ref 22–32)
Calcium: 9.4 mg/dL (ref 8.9–10.3)
Chloride: 101 mmol/L (ref 98–111)
Creatinine, Ser: 0.69 mg/dL (ref 0.44–1.00)
GFR calc Af Amer: >60 mL/min (ref >60)
GFR calc non Af Amer: >60 mL/min (ref >60)
Glucose, Bld: 101 mg/dL — ABNORMAL HIGH (ref 70–99)
Potassium: 4 mmol/L (ref 3.5–5.1)
Sodium: 135 mmol/L (ref 135–145)
Total Bilirubin: 0.6 mg/dL (ref 0.3–1.2)
Total Protein: 6.7 g/dL (ref 6.5–8.1)

## 2019-08-03 MED ORDER — SODIUM CHLORIDE 0.9 % IV SOLN
1200.0000 mg | Freq: Once | INTRAVENOUS | Status: AC
  Administered 2019-08-03: 1200 mg via INTRAVENOUS
  Filled 2019-08-03: qty 20

## 2019-08-03 MED ORDER — SODIUM CHLORIDE 0.9 % IV SOLN
Freq: Once | INTRAVENOUS | Status: AC
  Administered 2019-08-03: 10:00:00 via INTRAVENOUS
  Filled 2019-08-03: qty 250

## 2019-08-03 MED ORDER — DEXAMETHASONE SODIUM PHOSPHATE 10 MG/ML IJ SOLN
10.0000 mg | Freq: Once | INTRAMUSCULAR | Status: AC
  Administered 2019-08-03: 10:00:00 10 mg via INTRAVENOUS
  Filled 2019-08-03: qty 1

## 2019-08-03 MED ORDER — SODIUM CHLORIDE 0.9% FLUSH
10.0000 mL | Freq: Once | INTRAVENOUS | Status: AC
  Administered 2019-08-03: 10:00:00 10 mL via INTRAVENOUS
  Filled 2019-08-03: qty 10

## 2019-08-03 MED ORDER — HEPARIN SOD (PORK) LOCK FLUSH 100 UNIT/ML IV SOLN
500.0000 [IU] | Freq: Once | INTRAVENOUS | Status: AC | PRN
  Administered 2019-08-03: 500 [IU]
  Filled 2019-08-03: qty 5

## 2019-08-03 NOTE — Progress Notes (Signed)
Hematology/Oncology Follow up note [facility] Telephone:([phone] [phone]   Patient Care Team: Glean Hess, MD as PCP - General (Internal Medicine) Charolette Forward, MD as Consulting Physician (Cardiology) Telford Nab, RN as Registered Nurse  REFERRING PROVIDER: Dr. Felicie Morn REASON FOR VISIT:  Follow-up for small cell lung cancer,   HISTORY OF PRESENTING ILLNESS:  Kristi [name] is a  73 y.o.  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  10/20/2018 CT chest with contrast showed large mediastinal mass involving both hilar, left greater than right, consistent with lung carcinoma, The mass causes narrowing of the left mainstem bronchus with resultant volume loss on the left and a mediastinal shift to the left.  Moderate size left pleural effusion. 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  # Nov 2019- Jan 2020 s/p 4 cycles of Carbo/Etoposide/Tecentriq #  01/24/2019 interim CT scan done which showed continued positive response to therapy with continued reduction in mediastinal adenopathy.  No residual measurable left lung mass. CT findings of acute emphysematous cystitis. Urology did not feel that patient has pyelonephritis and recommend treatment with antibiotics because patient's immunocompromise. Patient finished treatment.   # Chest and whole brain radiation finished in May 2020 # 04/25/2019 resume Tecentriq every 3 weeks.  INTERVAL HISTORY Kristi [name] is a 73 y.o. female who has above history reviewed by me today presents for evaluation prior to immunotherapy for treatment of extensive small cell lung cancer. Patient reports feeling pretty well. She has not needed to use her oxygen machine.  She wants the company to pick up the machine. Mood has been stable. Denies any dizziness, fever, cough,  dysuria, chest pain or abdominal pain .  Chronic fatigue at baseline. She has been quite forgetful.     Review of Systems  Constitutional: Positive for fatigue. Negative for appetite change, chills and fever.  HENT:   Negative for hearing loss and voice change.   Eyes: Negative for eye problems.  Respiratory: Negative for chest tightness, cough and shortness of breath.   Cardiovascular: Negative for chest pain.  Gastrointestinal: Negative for abdominal distention, abdominal pain, blood in stool, diarrhea and nausea.  Endocrine: Negative for hot flashes.  Genitourinary: Negative for difficulty urinating and frequency.   Musculoskeletal: Negative for arthralgias.  Skin: Negative for itching and rash.  Neurological: Negative for extremity weakness.  Hematological: Negative for adenopathy.  Psychiatric/Behavioral: Negative for confusion and depression. The patient is not nervous/anxious.        Forgetful     MEDICAL HISTORY:  Past Medical History:  Diagnosis Date   Acute MI, inferoposterior wall ([place]) 09/30/2014   Claustrophobia    Coronary artery disease    Hypercholesteremia    Hypertension    MI, old    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 ([place]) 10/27/2018    SURGICAL HISTORY: Past Surgical History:  Procedure Laterality Date   APPENDECTOMY     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   BLADDER NECK SUSPENSION     CHOLECYSTECTOMY N/A 08/14/2018   Procedure: LAPAROSCOPIC CHOLECYSTECTOMY;  Surgeon: Jules Husbands, MD;  Location: [facility] ORS;  Service: General;  Laterality: N/A;   CHOLECYSTECTOMY  11/2018   CORONARY STENT PLACEMENT     ENDOBRONCHIAL ULTRASOUND N/A 10/21/2018   Procedure: ENDOBRONCHIAL ULTRASOUND;  Surgeon: Laverle Hobby, MD;  Location: [facility] ORS;  Service: Pulmonary;  Laterality: N/A;   HERNIA REPAIR  IR FLUORO GUIDE CV LINE RIGHT  12/19/2018   LEFT HEART CATHETERIZATION WITH CORONARY ANGIOGRAM N/A 09/29/2014   Procedure: LEFT HEART CATHETERIZATION  WITH CORONARY ANGIOGRAM;  Surgeon: Clent Demark, MD;  Location: [facility];  Service: Cardiovascular;  Laterality: N/A;   PORTA CATH INSERTION N/A 01/02/2019   Procedure: PORTA CATH INSERTION;  Surgeon: Algernon Huxley, MD;  Location: [facility];  Service: Cardiovascular;  Laterality: N/A;   PORTACATH PLACEMENT Right 10/28/2018   Procedure: INSERTION PORT-A-CATH;  Surgeon: Jules Husbands, MD;  Location: [facility] ORS;  Service: General;  Laterality: Right;    SOCIAL HISTORY: Social History   Socioeconomic History   Marital status: Married    Spouse name: [name]    Number of children: 3   Years of education: Not on file   Highest education level: Not on file  Occupational History   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 strain: Not very hard   Food insecurity    Worry: Not on file    Inability: Not on file   Transportation needs    Medical: No    Non-medical: No  Tobacco Use   Smoking status: Former Smoker    Packs/day: 1.00    Years: 39.00    Pack years: 39.00    Types: Cigarettes    Start date: 12/21/1978    Quit date: 10/16/2018    Years since quitting: 0.7   Smokeless tobacco: Never Used  Substance and Sexual Activity   Alcohol use: Yes    Alcohol/week: 0.0 standard drinks    Comment: occassional - approx 1 every 2 weeks    Drug use: No   Sexual activity: Yes    Birth control/protection: None  Lifestyle   Physical activity    Days per week: 0 days    Minutes per session: Not on file   Stress: To some extent  Relationships   Social connections    Talks on phone: Three times a week    Gets together: Once a week    Attends religious service: 1 to 4 times per year    Active member of club or organization: Not on file    Attends meetings of clubs or organizations: Not on file    Relationship status: Married   Intimate partner violence    Fear of current or ex partner: No    Emotionally abused: No     Physically abused: No    Forced sexual activity: No  Other Topics Concern   Not on file  Social History Narrative   Not on file    FAMILY HISTORY: Family History  Problem Relation Age of Onset   Alzheimer's disease Mother    Colon cancer Father    Breast cancer Neg Hx     ALLERGIES:  has No Known Allergies.  MEDICATIONS:  Current Outpatient Medications  Medication Sig Dispense Refill   atorvastatin (LIPITOR) 80 MG tablet Take 0.5 tablets (40 mg total) by mouth daily at 6 PM. 30 tablet 3   B Complex Vitamins (VITAMIN B COMPLEX PO) Take 1 tablet by mouth daily.      CALCIUM PO Take 1 tablet by mouth daily.      Cholecalciferol (VITAMIN D) 2000 units CAPS Take 1 capsule (2,000 Units total) by mouth daily. 30 capsule    diphenhydrAMINE-zinc acetate (BENADRYL EXTRA STRENGTH) cream Apply 1 application topically 3 (three) times daily as needed for itching. 28.4 g  0   HYDROcodone-acetaminophen (NORCO/VICODIN) 5-325 MG tablet Take 1 tablet by mouth every 6 (six) hours as needed for moderate pain. 30 tablet 0   lidocaine-prilocaine (EMLA) cream Apply to affected area once 30 g 3   nitroGLYCERIN (NITROSTAT) 0.4 MG SL tablet Place 1 tablet (0.4 mg total) under the tongue every 5 (five) minutes x 3 doses as needed for chest pain. 25 tablet 12   omeprazole (PRILOSEC) 40 MG capsule Take 1 capsule (40 mg total) by mouth daily. 90 capsule 1   ondansetron (ZOFRAN) 8 MG tablet Take 1 tablet (8 mg total) by mouth 2 (two) times daily as needed for refractory nausea / vomiting. Start on day 3 after carboplatin chemo. 30 tablet 1   oxyCODONE (OXYCONTIN) 10 mg 12 hr tablet Take 1 tablet (10 mg total) by mouth every 12 (twelve) hours. 60 tablet 0   polyethylene glycol (MIRALAX / GLYCOLAX) packet Take 17 g by mouth daily as needed.      prochlorperazine (COMPAZINE) 10 MG tablet TAKE 1 TABLET BY MOUTH EVERY 6 HOURS AS NEEDED FOR NAUSEA OR VOMITING 270 tablet 1   sertraline (ZOLOFT) 50 MG  tablet Take 1.5 tablets (75 mg total) by mouth daily. 45 tablet 2   dexamethasone (DECADRON) 4 MG tablet Take 1 tablet (4 mg total) by mouth daily. (Patient not taking: Reported on 08/03/2019) 25 tablet 0   metoprolol tartrate (LOPRESSOR) 25 MG tablet Take 12.5 mg by mouth 2 (two) times daily.     triamterene-hydrochlorothiazide (DYAZIDE) 37.5-25 MG capsule Take 1 each (1 capsule total) by mouth daily as needed. (Patient not taking: Reported on 07/13/2019) 90 capsule 0   No current facility-administered medications for this visit.    Facility-Administered Medications Ordered in Other Visits  Medication Dose Route Frequency Provider Last Rate Last Dose   heparin lock flush 100 unit/mL  500 Units Intravenous Once Earlie Server, MD       heparin lock flush 100 unit/mL  500 Units Intravenous Once Earlie Server, MD       sodium chloride flush (NS) 0.9 % injection 10 mL  10 mL Intravenous PRN Earlie Server, MD   10 mL at 01/09/19 0820   sodium chloride flush (NS) 0.9 % injection 10 mL  10 mL Intravenous PRN Earlie Server, MD   10 mL at 01/10/19 1400     PHYSICAL EXAMINATION: ECOG PERFORMANCE STATUS: 1 - Symptomatic but completely ambulatory Vitals:   08/03/19 0951  BP: 116/82  Pulse: 87  Resp: 16  Temp: 97.8 F (36.6 C)   Filed Weights   08/03/19 0951  Weight: 145 lb 4.8 oz (65.9 kg)   Physical Exam  Constitutional: She is oriented to person, place, and time. No distress.  HENT:  Head: Normocephalic and atraumatic.  Nose: Nose normal.  Mouth/Throat: Oropharynx is clear and moist. No oropharyngeal exudate.  Eyes: Pupils are equal, round, and reactive to light. EOM are normal. No scleral icterus.  Neck: Normal range of motion. Neck supple.  Cardiovascular: Normal rate and regular rhythm.  No murmur heard. Pulmonary/Chest: Effort normal. No respiratory distress. She has no rales. She exhibits no tenderness.  Decreased breath sounds bilaterally.  Abdominal: Soft. She exhibits no distension. There is  no abdominal tenderness.  Musculoskeletal: Normal range of motion.        General: No edema.  Neurological: She is alert and oriented to person, place, and time.  Skin: Skin is warm and dry. She is not diaphoretic. No erythema.  Psychiatric: Affect  normal.       LABORATORY DATA:  I have reviewed the data as listed Lab Results  Component Value Date   WBC 5.2 08/03/2019   HGB 12.3 08/03/2019   HCT 36.1 08/03/2019   MCV 87.2 08/03/2019   PLT 176 08/03/2019   Recent Labs    08/12/18 1438  07/10/19 1010 07/13/19 0837 08/03/19 0935  NA 131*   < > 135 134* 135  K 3.7   < > 4.0 4.0 4.0  CL 97*   < > 100 101 101  CO2 25   < > 25 25 24   GLUCOSE 122*   < > 108* 106* 101*  BUN 9   < > 12 14 10   CREATININE 0.74   < > 0.78 0.66 0.69  CALCIUM 9.1   < > 9.2 9.3 9.4  GFRNONAA >60   < > >60 >60 >60  GFRAA >60   < > >60 >60 >60  PROT 8.0   < > 7.1 7.1 6.7  ALBUMIN 4.3   < > 3.9 4.0 4.0  AST 33   < > 17 16 16   ALT 26   < > 16 17 13   ALKPHOS 81   < > 76 78 70  BILITOT 0.6   < > 0.9 0.8 0.6  BILIDIR 0.2  --   --   --   --   IBILI 0.4  --   --   --   --    < > = values in this interval not displayed.    RADIOGRAPHIC STUDIES: I have personally reviewed the radiological images as listed and agreed with the findings in the report. 10/20/2018 CT chest w contrast . Large mediastinal mass involving both hila left much greater than right consistent with lung carcinoma possibly small cell lung carcinoma. This mass causes narrowing of the left mainstem bronchus with resultant volume loss on the left and mediastinal shift to the Left. 2. Moderate size left pleural effusion. 3. Coronary artery calcifications and moderate thoracic aortic atherosclerosis. PET 10/26/2018 . Large left lung mass is markedly hypermetabolic in the confluent lymphadenopathy in the mediastinum and in bilateral hilar regions also shows marked hypermetabolism. 2. Hypermetabolic metastatic lymphadenopathy in the right neck and  supraclavicular region. 3. Hypermetabolic lymphadenopathy in the hepato duodenal ligament consistent with metastatic disease. 4. Large hypermetabolic lesion posterior right acetabulum without correlate of CT finding. Features highly suspicious for bony metastatic disease. MRI brain 10/28/2018 1. No metastatic disease or acute intracranial abnormality identified. 2. 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  3. Moderately advanced but nonspecific cerebral white matter signal changes, most commonly due to chronic small vessel disease. Lesser signal changes in the deep gray matter and pons may also be small vessel related.  CT chest abdomen pelvis with contrast 12/15/2018 1 Marked response to therapy of thoracic adenopathy. 2. No new or progressive disease in the chest. 3. Coronary artery atherosclerosis. 4. 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 5. No new or progressive disease in the abdomen or pelvis 6. Pelvic floor laxity, cystocele with bladder wall thickening, suggesting a component of outlet obstruction. 7. Possible vague sclerosis within the posterior right acetabulum, in the region of hypermetabolism on prior PET. Likely an area of treated metastasis. 8. Central line tip in right brachiocephalic vein with surrounding occlusive thrombus. CT chest abdomen pelvis with contrast 01/24/2019 1. New extensive gas throughout the bladder wall and within the bladder lumen with underlying mild diffuse bladder wall thickening,  compatible with acute emphysematous cystitis. 2. Two new subcentimeter low-attenuation lesions in the right kidney, nonspecific but worrisome for mild acute right pyelonephritis given the above bladder findings. No discrete/drainable renal abscess. No hydronephrosis. 3. Continued positive response to therapy with continued reduction in mediastinal adenopathy. No residual measurable left lung mass.Stable faint sclerosis at the site of the hypermetabolic right  acetabular osseous lesion. No new or progressive metastatic disease in the chest, abdomen or pelvis. 4. Small bowel containing small periumbilical hernia without bowel complication at this time. 5. Aortic Atherosclerosis (ICD10-I70.0) and Emphysema (ICD10-J43.9).  05/05/2019 CT chest abdomen pelvis with contrast Near complete resolution of prior mediastinal lymphadenopathy.  Residual 12 mm short axis subcarinal node. Radiation changes in the lungs bilaterally.  Left lower lobe/lingular scarring.  No suspicious pulmonary nodules.  No evidence of metastatic disease in the abdomen or pelvis.   ASSESSMENT & PLAN:  1. Small cell lung cancer ([place])   2. Port-A-Cath in place   3. Encounter for antineoplastic immunotherapy   4. Memory loss    #Extensive small cell lung cancer S/p 4 cycles of Carboplatin, Etoposide and Tecentriq.   Status post chest and whole brain radiation. Currently on Tecentriq maintenance. She tolerates immunotherapy well.  No significant immunotherapy toxicity. Labs are reviewed and discussed with patient. Counts acceptable to proceed with Tecentriq today. Surveillance CT chest abdomen pelvis images were independent reviewed by me and discussed with patient.  She is in remission. Plan repeat CT chest abdomen pelvis in 3 months as well as MRN brain scan in 3 months.  #Chronic fatigue, TSH has been followed.  Free T4 1.34. Can check cortisol level at the next visit.  #Forgetfulness, likely secondary to whole brain radiation. Recommend patient to write things down.  Depression has been well controlled.  Continue Zoloft.. Follow-up with palliative care.  Follow up in 3 weeks for eval with next cycle of tecentriq.  Orders Placed This Encounter  Procedures   CT Chest W Contrast    Standing Status:   Future    Standing Expiration Date:   08/02/2020    Order Specific Question:   If indicated for the ordered procedure, I authorize the administration of contrast media per  Radiology protocol    Answer:   Yes    Order Specific Question:   Preferred imaging location?    Answer:   [facility]    Order Specific Question:   Radiology Contrast Protocol - do NOT remove file path    Answer:   \[email or web address]   CT Abdomen Pelvis W Contrast    Standing Status:   Future    Standing Expiration Date:   08/02/2020    Order Specific Question:   If indicated for the ordered procedure, I authorize the administration of contrast media per Radiology protocol    Answer:   Yes    Order Specific Question:   Preferred imaging location?    Answer:   [facility]    Order Specific Question:   Is Oral Contrast requested for this exam?    Answer:   Yes, Per Radiology protocol    Order Specific Question:   Radiology Contrast Protocol - do NOT remove file path    Answer:   \[email or web address]   MR Brain W Wo Contrast    Standing Status:   Future    Standing Expiration Date:   08/02/2020    Order Specific Question:   ** REASON FOR EXAM (FREE TEXT)    Answer:   small cell  lung cancer follo wup    Order Specific Question:   If indicated for the ordered procedure, I authorize the administration of contrast media per Radiology protocol    Answer:   Yes    Order Specific Question:   What is the patient's sedation requirement?    Answer:   No Sedation    Order Specific Question:   Does the patient have a pacemaker or implanted devices?    Answer:   No    Order Specific Question:   Use SRS Protocol?    Answer:   No    Order Specific Question:   Radiology Contrast Protocol - do NOT remove file path    Answer:   [email or web address]    Order Specific Question:   Preferred imaging location?    Answer:   [facility] (table limit-400lbs)     Earlie Server, MD, PhD

## 2019-08-03 NOTE — Progress Notes (Signed)
Patient would like to have her home oxygen picked up because she is no longer needing.

## 2019-08-03 NOTE — Telephone Encounter (Signed)
Orders faxed for home oxygen concentrator to be picked up since she is no longer using.

## 2019-08-04 ENCOUNTER — Telehealth: Payer: Self-pay | Admitting: *Deleted

## 2019-08-04 NOTE — Telephone Encounter (Signed)
I did not put in check out note to do CT. Please cancel. I pre ordered CT for 3 months. No need to schedule that yet. I am seeing her Q3 weeks. Will decide when she is due.

## 2019-08-04 NOTE — Telephone Encounter (Signed)
Patient called confused as to why another CT is order for 9/4 when she just had a CT on 8/6. Per Ellison Hughs, she received and secure chat to schedule this for 3 weeks. Per office note:  "Plan repeat CT chest abdomen pelvis in 3 months as well as MRN brain scan in 3 months.". There is no MRI ordered either. Please advise

## 2019-08-04 NOTE — Telephone Encounter (Signed)
Patient informed tha tCT for 9/4 has been cancelled .

## 2019-08-10 ENCOUNTER — Other Ambulatory Visit: Payer: Self-pay | Admitting: Oncology

## 2019-08-10 DIAGNOSIS — C3490 Malignant neoplasm of unspecified part of unspecified bronchus or lung: Secondary | ICD-10-CM

## 2019-08-10 MED ORDER — OXYCODONE HCL ER 10 MG PO T12A: 10.0000 mg | EXTENDED_RELEASE_TABLET | Freq: Two times a day (BID) | ORAL | 0 refills | Status: DC

## 2019-08-10 NOTE — Progress Notes (Signed)
  Patient called cancer center requesting refill of Oxycotin 10 mg q 12 hours.  As mandated by the [place] STOP Act (Strengthen Opioid Misuse Prevention), the [place] Controlled Substance Reporting System ([place]) was reviewed for this patient.  Below is the past 17-months of controlled substance prescriptions as displayed by the registry.  I have personally consulted with my supervising physician, Dr. Tasia Catchings, who agrees that continuation of opiate therapy is medically appropriate at this time and agrees to provide continual monitoring, including urine/blood drug screens, as indicated. Refill is appropriate on or after 08/03/19.   [facility] reviewed:     Faythe Casa, NP 08/10/2019 4:07 PM [phone]

## 2019-08-24 ENCOUNTER — Inpatient Hospital Stay: Payer: Medicare Other

## 2019-08-24 ENCOUNTER — Encounter: Payer: Self-pay | Admitting: Oncology

## 2019-08-24 ENCOUNTER — Inpatient Hospital Stay (HOSPITAL_BASED_OUTPATIENT_CLINIC_OR_DEPARTMENT_OTHER): Payer: Medicare Other | Admitting: Oncology

## 2019-08-24 ENCOUNTER — Other Ambulatory Visit: Payer: Self-pay

## 2019-08-24 ENCOUNTER — Inpatient Hospital Stay: Payer: Medicare Other | Attending: Oncology

## 2019-08-24 VITALS — BP 105/83 | HR 85 | Temp 96.3°F | Resp 18 | Wt 146.6 lb

## 2019-08-24 DIAGNOSIS — R5383 Other fatigue: Secondary | ICD-10-CM

## 2019-08-24 DIAGNOSIS — Z5112 Encounter for antineoplastic immunotherapy: Secondary | ICD-10-CM | POA: Diagnosis not present

## 2019-08-24 DIAGNOSIS — Z95828 Presence of other vascular implants and grafts: Secondary | ICD-10-CM

## 2019-08-24 DIAGNOSIS — C3490 Malignant neoplasm of unspecified part of unspecified bronchus or lung: Secondary | ICD-10-CM

## 2019-08-24 DIAGNOSIS — C3492 Malignant neoplasm of unspecified part of left bronchus or lung: Secondary | ICD-10-CM | POA: Diagnosis not present

## 2019-08-24 DIAGNOSIS — R413 Other amnesia: Secondary | ICD-10-CM

## 2019-08-24 LAB — CBC WITH DIFFERENTIAL/PLATELET
Abs Immature Granulocytes: 0.04 10*3/uL (ref 0.00–0.07)
Basophils Absolute: 0 10*3/uL (ref 0.0–0.1)
Basophils Relative: 1 %
Eosinophils Absolute: 0.2 10*3/uL (ref 0.0–0.5)
Eosinophils Relative: 3 %
HCT: 36.8 % (ref 36.0–46.0)
Hemoglobin: 12.5 g/dL (ref 12.0–15.0)
Immature Granulocytes: 1 %
Lymphocytes Relative: 17 %
Lymphs Abs: 1.1 10*3/uL (ref 0.7–4.0)
MCH: 29.8 pg (ref 26.0–34.0)
MCHC: 34 g/dL (ref 30.0–36.0)
MCV: 87.6 fL (ref 80.0–100.0)
Monocytes Absolute: 0.9 10*3/uL (ref 0.1–1.0)
Monocytes Relative: 14 %
Neutro Abs: 4.2 10*3/uL (ref 1.7–7.7)
Neutrophils Relative %: 64 %
Platelets: 156 10*3/uL (ref 150–400)
RBC: 4.2 MIL/uL (ref 3.87–5.11)
RDW: 13.9 % (ref 11.5–15.5)
WBC: 6.4 10*3/uL (ref 4.0–10.5)
nRBC: 0 % (ref 0.0–0.2)

## 2019-08-24 LAB — COMPREHENSIVE METABOLIC PANEL
ALT: 12 U/L (ref 0–44)
AST: 14 U/L — ABNORMAL LOW (ref 15–41)
Albumin: 4 g/dL (ref 3.5–5.0)
Alkaline Phosphatase: 64 U/L (ref 38–126)
Anion gap: 7 (ref 5–15)
BUN: 13 mg/dL (ref 8–23)
CO2: 25 mmol/L (ref 22–32)
Calcium: 9.3 mg/dL (ref 8.9–10.3)
Chloride: 103 mmol/L (ref 98–111)
Creatinine, Ser: 0.55 mg/dL (ref 0.44–1.00)
GFR calc Af Amer: >60 mL/min (ref >60)
GFR calc non Af Amer: >60 mL/min (ref >60)
Glucose, Bld: 104 mg/dL — ABNORMAL HIGH (ref 70–99)
Potassium: 4.1 mmol/L (ref 3.5–5.1)
Sodium: 135 mmol/L (ref 135–145)
Total Bilirubin: 0.6 mg/dL (ref 0.3–1.2)
Total Protein: 7.1 g/dL (ref 6.5–8.1)

## 2019-08-24 LAB — CORTISOL: Cortisol, Plasma: 11.2 ug/dL

## 2019-08-24 MED ORDER — HEPARIN SOD (PORK) LOCK FLUSH 100 UNIT/ML IV SOLN
500.0000 [IU] | Freq: Once | INTRAVENOUS | Status: AC | PRN
  Administered 2019-08-24: 12:00:00 500 [IU]
  Filled 2019-08-24: qty 5

## 2019-08-24 MED ORDER — SODIUM CHLORIDE 0.9% FLUSH
10.0000 mL | Freq: Once | INTRAVENOUS | Status: AC
  Administered 2019-08-24: 09:00:00 10 mL via INTRAVENOUS
  Filled 2019-08-24: qty 10

## 2019-08-24 MED ORDER — DEXAMETHASONE SODIUM PHOSPHATE 10 MG/ML IJ SOLN
10.0000 mg | Freq: Once | INTRAMUSCULAR | Status: AC
  Administered 2019-08-24: 10 mg via INTRAVENOUS
  Filled 2019-08-24: qty 1

## 2019-08-24 MED ORDER — SODIUM CHLORIDE 0.9 % IV SOLN
1200.0000 mg | Freq: Once | INTRAVENOUS | Status: AC
  Administered 2019-08-24: 11:00:00 1200 mg via INTRAVENOUS
  Filled 2019-08-24: qty 20

## 2019-08-24 MED ORDER — SODIUM CHLORIDE 0.9 % IV SOLN
Freq: Once | INTRAVENOUS | Status: AC
  Administered 2019-08-24: 10:00:00 via INTRAVENOUS
  Filled 2019-08-24: qty 250

## 2019-08-24 NOTE — Progress Notes (Signed)
Pt in for follow up, reports having issues with hiccups for "about a month".

## 2019-08-25 ENCOUNTER — Ambulatory Visit: Payer: Medicare Other

## 2019-08-25 NOTE — Progress Notes (Signed)
Hematology/Oncology Follow up note [facility] Telephone:([phone] [phone]   Patient Care Team: Glean Hess, MD as PCP - General (Internal Medicine) Charolette Forward, MD as Consulting Physician (Cardiology) Telford Nab, RN as Registered Nurse  REFERRING PROVIDER: Dr. Felicie Morn REASON FOR VISIT:  Follow-up for small cell lung cancer,   HISTORY OF PRESENTING ILLNESS:  Kristi [name] is a  73 y.o.  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  10/20/2018 CT chest with contrast showed large mediastinal mass involving both hilar, left greater than right, consistent with lung carcinoma, The mass causes narrowing of the left mainstem bronchus with resultant volume loss on the left and a mediastinal shift to the left.  Moderate size left pleural effusion. 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  # Nov 2019- Jan 2020 s/p 4 cycles of Carbo/Etoposide/Tecentriq #  01/24/2019 interim CT scan done which showed continued positive response to therapy with continued reduction in mediastinal adenopathy.  No residual measurable left lung mass. CT findings of acute emphysematous cystitis. Urology did not feel that patient has pyelonephritis and recommend treatment with antibiotics because patient's immunocompromise. Patient finished treatment.   # Chest and whole brain radiation finished in May 2020 # 04/25/2019 resume Tecentriq every 3 weeks.  INTERVAL HISTORY Kristi [name] is a 73 y.o. female who has above history reviewed by me today presents for evaluation prior to immunotherapy for treatment of extensive small cell lung cancer. Patient reports feeling well.  Denies any dizziness, fever, cough, dysuria, chest pain, abdominal pain, shortness of breath, skin rash or diarrhea. Chronic fatigue at baseline. She has been quite  forgetful .  No new complaint     Review of Systems  Constitutional: Positive for fatigue. Negative for appetite change, chills and fever.  HENT:   Negative for hearing loss and voice change.   Eyes: Negative for eye problems.  Respiratory: Negative for chest tightness, cough and shortness of breath.   Cardiovascular: Negative for chest pain.  Gastrointestinal: Negative for abdominal distention, abdominal pain, blood in stool, diarrhea and nausea.  Endocrine: Negative for hot flashes.  Genitourinary: Negative for difficulty urinating and frequency.   Musculoskeletal: Negative for arthralgias.  Skin: Negative for itching and rash.  Neurological: Negative for extremity weakness.  Hematological: Negative for adenopathy.  Psychiatric/Behavioral: Negative for confusion and depression. The patient is not nervous/anxious.        Forgetful     MEDICAL HISTORY:  Past Medical History:  Diagnosis Date  . Acute MI, inferoposterior wall ([place]) 09/30/2014  . Claustrophobia   . Coronary artery disease   . Hypercholesteremia   . Hypertension   . MI, old   . 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 [facility]) 10/27/2018    SURGICAL HISTORY: Past Surgical History:  Procedure Laterality Date  . APPENDECTOMY     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  . BLADDER NECK SUSPENSION    . CHOLECYSTECTOMY N/A 08/14/2018   Procedure: LAPAROSCOPIC CHOLECYSTECTOMY;  Surgeon: Jules Husbands, MD;  Location: [facility] ORS;  Service: General;  Laterality: N/A;  . CHOLECYSTECTOMY  11/2018  . CORONARY STENT PLACEMENT    . ENDOBRONCHIAL ULTRASOUND N/A 10/21/2018   Procedure: ENDOBRONCHIAL ULTRASOUND;  Surgeon: Laverle Hobby, MD;  Location: [facility] ORS;  Service: Pulmonary;  Laterality: N/A;  . HERNIA REPAIR    . IR FLUORO GUIDE CV LINE RIGHT  12/19/2018  .  LEFT HEART CATHETERIZATION WITH CORONARY ANGIOGRAM N/A 09/29/2014   Procedure: LEFT HEART CATHETERIZATION WITH CORONARY ANGIOGRAM;  Surgeon: Clent Demark, MD;  Location: [facility]  CATH LAB;  Service: Cardiovascular;  Laterality: N/A;  . PORTA CATH INSERTION N/A 01/02/2019   Procedure: PORTA CATH INSERTION;  Surgeon: Algernon Huxley, MD;  Location: [facility] CV LAB;  Service: Cardiovascular;  Laterality: N/A;  . PORTACATH PLACEMENT Right 10/28/2018   Procedure: INSERTION PORT-A-CATH;  Surgeon: Jules Husbands, MD;  Location: [facility] ORS;  Service: General;  Laterality: Right;    SOCIAL HISTORY: Social History   Socioeconomic History  . Marital status: Married    Spouse name: [name]   . Number of children: 3  . Years of education: Not on file  . Highest education level: Not on file  Occupational History  . Occupation: Retired    Comment: Chief Technology Officer   Social Needs  . Financial resource strain: Not very hard  . Food insecurity    Worry: Not on file    Inability: Not on file  . Transportation needs    Medical: No    Non-medical: No  Tobacco Use  . Smoking status: Former Smoker    Packs/day: 1.00    Years: 39.00    Pack years: 39.00    Types: Cigarettes    Start date: 12/21/1978    Quit date: 10/16/2018    Years since quitting: 0.8  . Smokeless tobacco: Never Used  Substance and Sexual Activity  . Alcohol use: Yes    Alcohol/week: 0.0 standard drinks    Comment: occassional - approx 1 every 2 weeks   . Drug use: No  . Sexual activity: Yes    Birth control/protection: None  Lifestyle  . Physical activity    Days per week: 0 days    Minutes per session: Not on file  . Stress: To some extent  Relationships  . Social Herbalist on phone: Three times a week    Gets together: Once a week    Attends religious service: 1 to 4 times per year    Active member of club or organization: Not on file    Attends meetings of clubs or organizations: Not on file    Relationship status: Married  . Intimate partner violence    Fear of current or ex partner: No    Emotionally abused: No    Physically abused: No    Forced sexual activity: No  Other Topics  Concern  . Not on file  Social History Narrative  . Not on file    FAMILY HISTORY: Family History  Problem Relation Age of Onset  . Alzheimer's disease Mother   . Colon cancer Father   . Breast cancer Neg Hx     ALLERGIES:  has No Known Allergies.  MEDICATIONS:  Current Outpatient Medications  Medication Sig Dispense Refill  . atorvastatin (LIPITOR) 80 MG tablet Take 0.5 tablets (40 mg total) by mouth daily at 6 PM. 30 tablet 3  . B Complex Vitamins (VITAMIN B COMPLEX PO) Take 1 tablet by mouth daily.     [name] Kitchen CALCIUM PO Take 1 tablet by mouth daily.     . Cholecalciferol (VITAMIN D) 2000 units CAPS Take 1 capsule (2,000 Units total) by mouth daily. 30 capsule   . diphenhydrAMINE-zinc acetate (BENADRYL EXTRA STRENGTH) cream Apply 1 application topically 3 (three) times daily as needed for itching. 28.4 g 0  . lidocaine-prilocaine (EMLA) cream Apply to affected area  once 30 g 3  . nitroGLYCERIN (NITROSTAT) 0.4 MG SL tablet Place 1 tablet (0.4 mg total) under the tongue every 5 (five) minutes x 3 doses as needed for chest pain. 25 tablet 12  . omeprazole (PRILOSEC) 40 MG capsule Take 1 capsule (40 mg total) by mouth daily. 90 capsule 1  . ondansetron (ZOFRAN) 8 MG tablet Take 1 tablet (8 mg total) by mouth 2 (two) times daily as needed for refractory nausea / vomiting. Start on day 3 after carboplatin chemo. 30 tablet 1  . oxyCODONE (OXYCONTIN) 10 mg 12 hr tablet Take 1 tablet (10 mg total) by mouth every 12 (twelve) hours. 60 tablet 0  . polyethylene glycol (MIRALAX / GLYCOLAX) packet Take 17 g by mouth daily as needed.     . prochlorperazine (COMPAZINE) 10 MG tablet TAKE 1 TABLET BY MOUTH EVERY 6 HOURS AS NEEDED FOR NAUSEA OR VOMITING 270 tablet 1  . sertraline (ZOLOFT) 50 MG tablet Take 1.5 tablets (75 mg total) by mouth daily. 45 tablet 2  . dexamethasone (DECADRON) 4 MG tablet Take 1 tablet (4 mg total) by mouth daily. (Patient not taking: Reported on 08/03/2019) 25 tablet 0  .  HYDROcodone-acetaminophen (NORCO/VICODIN) 5-325 MG tablet Take 1 tablet by mouth every 6 (six) hours as needed for moderate pain. (Patient not taking: Reported on 08/24/2019) 30 tablet 0  . metoprolol tartrate (LOPRESSOR) 25 MG tablet Take 12.5 mg by mouth 2 (two) times daily.    [name] Kitchen triamterene-hydrochlorothiazide (DYAZIDE) 37.5-25 MG capsule Take 1 each (1 capsule total) by mouth daily as needed. (Patient not taking: Reported on 07/13/2019) 90 capsule 0   No current facility-administered medications for this visit.    Facility-Administered Medications Ordered in Other Visits  Medication Dose Route Frequency Provider Last Rate Last Dose  . heparin lock flush 100 unit/mL  500 Units Intravenous Once Earlie Server, MD      . heparin lock flush 100 unit/mL  500 Units Intravenous Once Earlie Server, MD      . sodium chloride flush (NS) 0.9 % injection 10 mL  10 mL Intravenous PRN Earlie Server, MD   10 mL at 01/09/19 0820  . sodium chloride flush (NS) 0.9 % injection 10 mL  10 mL Intravenous PRN Earlie Server, MD   10 mL at 01/10/19 1400     PHYSICAL EXAMINATION: ECOG PERFORMANCE STATUS: 1 - Symptomatic but completely ambulatory Vitals:   08/24/19 0920  BP: 105/83  Pulse: 85  Resp: 18  Temp: (!) 96.3 F (35.7 C)  SpO2: 100%   Filed Weights   08/24/19 0920  Weight: 146 lb 9.6 oz (66.5 kg)   Physical Exam  Constitutional: She is oriented to person, place, and time. No distress.  HENT:  Head: Normocephalic and atraumatic.  Nose: Nose normal.  Mouth/Throat: Oropharynx is clear and moist. No oropharyngeal exudate.  Eyes: Pupils are equal, round, and reactive to light. EOM are normal. No scleral icterus.  Neck: Normal range of motion. Neck supple.  Cardiovascular: Normal rate and regular rhythm.  No murmur heard. Pulmonary/Chest: Effort normal. No respiratory distress. She has no rales. She exhibits no tenderness.  Decreased breath sounds bilaterally.  Abdominal: Soft. She exhibits no distension. There is no  abdominal tenderness.  Musculoskeletal: Normal range of motion.        General: No edema.  Neurological: She is alert and oriented to person, place, and time. No cranial nerve deficit. She exhibits normal muscle tone. Coordination normal.  Skin: Skin is warm  and dry. She is not diaphoretic. No erythema.  Psychiatric: Affect normal.       LABORATORY DATA:  I have reviewed the data as listed Lab Results  Component Value Date   WBC 6.4 08/24/2019   HGB 12.5 08/24/2019   HCT 36.8 08/24/2019   MCV 87.6 08/24/2019   PLT 156 08/24/2019   Recent Labs    07/13/19 0837 08/03/19 0935 08/24/19 0905  NA 134* 135 135  K 4.0 4.0 4.1  CL 101 101 103  CO2 25 24 25   GLUCOSE 106* 101* 104*  BUN 14 10 13   CREATININE 0.66 0.69 0.55  CALCIUM 9.3 9.4 9.3  GFRNONAA >60 >60 >60  GFRAA >60 >60 >60  PROT 7.1 6.7 7.1  ALBUMIN 4.0 4.0 4.0  AST 16 16 14*  ALT 17 13 12   ALKPHOS 78 70 64  BILITOT 0.8 0.6 0.6    RADIOGRAPHIC STUDIES: I have personally reviewed the radiological images as listed and agreed with the findings in the report. 10/20/2018 CT chest w contrast . Large mediastinal mass involving both hila left much greater than right consistent with lung carcinoma possibly small cell lung carcinoma. This mass causes narrowing of the left mainstem bronchus with resultant volume loss on the left and mediastinal shift to the Left. 2. Moderate size left pleural effusion. 3. Coronary artery calcifications and moderate thoracic aortic atherosclerosis. PET 10/26/2018 . Large left lung mass is markedly hypermetabolic in the confluent lymphadenopathy in the mediastinum and in bilateral hilar regions also shows marked hypermetabolism. 2. Hypermetabolic metastatic lymphadenopathy in the right neck and supraclavicular region. 3. Hypermetabolic lymphadenopathy in the hepato duodenal ligament consistent with metastatic disease. 4. Large hypermetabolic lesion posterior right acetabulum without correlate of CT  finding. Features highly suspicious for bony metastatic disease. MRI brain 10/28/2018 1. No metastatic disease or acute intracranial abnormality identified. 2. 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  3. Moderately advanced but nonspecific cerebral white matter signal changes, most commonly due to chronic small vessel disease. Lesser signal changes in the deep gray matter and pons may also be small vessel related.  CT chest abdomen pelvis with contrast 12/15/2018 1 Marked response to therapy of thoracic adenopathy. 2. No new or progressive disease in the chest. 3. Coronary artery atherosclerosis. 4. 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 5. No new or progressive disease in the abdomen or pelvis 6. Pelvic floor laxity, cystocele with bladder wall thickening, suggesting a component of outlet obstruction. 7. Possible vague sclerosis within the posterior right acetabulum, in the region of hypermetabolism on prior PET. Likely an area of treated metastasis. 8. Central line tip in right brachiocephalic vein with surrounding occlusive thrombus. CT chest abdomen pelvis with contrast 01/24/2019 1. New extensive gas throughout the bladder wall and within the bladder lumen with underlying mild diffuse bladder wall thickening, compatible with acute emphysematous cystitis. 2. Two new subcentimeter low-attenuation lesions in the right kidney, nonspecific but worrisome for mild acute right pyelonephritis given the above bladder findings. No discrete/drainable renal abscess. No hydronephrosis. 3. Continued positive response to therapy with continued reduction in mediastinal adenopathy. No residual measurable left lung mass.Stable faint sclerosis at the site of the hypermetabolic right acetabular osseous lesion. No new or progressive metastatic disease in the chest, abdomen or pelvis. 4. Small bowel containing small periumbilical hernia without bowel complication at this time. 5. Aortic  Atherosclerosis (ICD10-I70.0) and Emphysema (ICD10-J43.9).  05/05/2019 CT chest abdomen pelvis with contrast Near complete resolution of prior mediastinal lymphadenopathy.  Residual 12 mm short axis subcarinal node. Radiation changes in the lungs bilaterally.  Left lower lobe/lingular scarring.  No suspicious pulmonary nodules.  No evidence of metastatic disease in the abdomen or pelvis.   ASSESSMENT & PLAN:  1. Small cell lung cancer ([place])   2. Encounter for antineoplastic immunotherapy   3. Other fatigue   4. Memory loss    #Extensive small cell lung cancer S/p 4 cycles of Carboplatin, Etoposide and Tecentriq.   Status post chest and whole brain radiation. Currently on Tecentriq maintenance. Clinically doing well. Recent CT surveillance chest abdomen pelvis images showed no recurrence.  She is in remission. Plan repeat chest CT abdomen pelvis in 3 months as well as MRI.  November 2020.  #Occasional hiccups.  Observation.  If is persistent, can try Thorazine   #Chronic fatigue, TSH has been followed.  Free T4 1.34.  Cortisol normal   #Forgetfulness, likely secondary to whole brain radiation.  Depression has been well controlled.  Zoloft.. Follow-up with palliative care.  Follow up in 3 weeks for eval with next cycle of tecentriq.    Earlie Server, MD, PhD

## 2019-08-29 ENCOUNTER — Other Ambulatory Visit: Payer: Self-pay | Admitting: Oncology

## 2019-09-14 ENCOUNTER — Other Ambulatory Visit: Payer: Self-pay

## 2019-09-14 ENCOUNTER — Inpatient Hospital Stay: Payer: Medicare Other

## 2019-09-14 ENCOUNTER — Inpatient Hospital Stay (HOSPITAL_BASED_OUTPATIENT_CLINIC_OR_DEPARTMENT_OTHER): Payer: Medicare Other | Admitting: Oncology

## 2019-09-14 ENCOUNTER — Encounter: Payer: Self-pay | Admitting: Oncology

## 2019-09-14 VITALS — BP 101/82 | HR 92 | Temp 98.3°F | Resp 18 | Wt 147.5 lb

## 2019-09-14 DIAGNOSIS — C3490 Malignant neoplasm of unspecified part of unspecified bronchus or lung: Secondary | ICD-10-CM

## 2019-09-14 DIAGNOSIS — R413 Other amnesia: Secondary | ICD-10-CM | POA: Diagnosis not present

## 2019-09-14 DIAGNOSIS — Z5112 Encounter for antineoplastic immunotherapy: Secondary | ICD-10-CM | POA: Diagnosis not present

## 2019-09-14 DIAGNOSIS — R5383 Other fatigue: Secondary | ICD-10-CM

## 2019-09-14 DIAGNOSIS — C3492 Malignant neoplasm of unspecified part of left bronchus or lung: Secondary | ICD-10-CM | POA: Diagnosis not present

## 2019-09-14 DIAGNOSIS — Z95828 Presence of other vascular implants and grafts: Secondary | ICD-10-CM

## 2019-09-14 LAB — CBC WITH DIFFERENTIAL/PLATELET
Abs Immature Granulocytes: 0.03 10*3/uL (ref 0.00–0.07)
Basophils Absolute: 0 10*3/uL (ref 0.0–0.1)
Basophils Relative: 1 %
Eosinophils Absolute: 0.2 10*3/uL (ref 0.0–0.5)
Eosinophils Relative: 3 %
HCT: 35.2 % — ABNORMAL LOW (ref 36.0–46.0)
Hemoglobin: 11.7 g/dL — ABNORMAL LOW (ref 12.0–15.0)
Immature Granulocytes: 1 %
Lymphocytes Relative: 21 %
Lymphs Abs: 1.1 10*3/uL (ref 0.7–4.0)
MCH: 29.5 pg (ref 26.0–34.0)
MCHC: 33.2 g/dL (ref 30.0–36.0)
MCV: 88.9 fL (ref 80.0–100.0)
Monocytes Absolute: 0.7 10*3/uL (ref 0.1–1.0)
Monocytes Relative: 13 %
Neutro Abs: 3.2 10*3/uL (ref 1.7–7.7)
Neutrophils Relative %: 61 %
Platelets: 164 10*3/uL (ref 150–400)
RBC: 3.96 MIL/uL (ref 3.87–5.11)
RDW: 13.9 % (ref 11.5–15.5)
WBC: 5.2 10*3/uL (ref 4.0–10.5)
nRBC: 0 % (ref 0.0–0.2)

## 2019-09-14 LAB — COMPREHENSIVE METABOLIC PANEL
ALT: 14 U/L (ref 0–44)
AST: 18 U/L (ref 15–41)
Albumin: 3.9 g/dL (ref 3.5–5.0)
Alkaline Phosphatase: 65 U/L (ref 38–126)
Anion gap: 10 (ref 5–15)
BUN: 17 mg/dL (ref 8–23)
CO2: 23 mmol/L (ref 22–32)
Calcium: 9.2 mg/dL (ref 8.9–10.3)
Chloride: 103 mmol/L (ref 98–111)
Creatinine, Ser: 0.78 mg/dL (ref 0.44–1.00)
GFR calc Af Amer: >60 mL/min (ref >60)
GFR calc non Af Amer: >60 mL/min (ref >60)
Glucose, Bld: 103 mg/dL — ABNORMAL HIGH (ref 70–99)
Potassium: 4.1 mmol/L (ref 3.5–5.1)
Sodium: 136 mmol/L (ref 135–145)
Total Bilirubin: 0.5 mg/dL (ref 0.3–1.2)
Total Protein: 6.8 g/dL (ref 6.5–8.1)

## 2019-09-14 MED ORDER — DEXAMETHASONE SODIUM PHOSPHATE 10 MG/ML IJ SOLN
10.0000 mg | Freq: Once | INTRAMUSCULAR | Status: AC
  Administered 2019-09-14: 10 mg via INTRAVENOUS
  Filled 2019-09-14: qty 1

## 2019-09-14 MED ORDER — SODIUM CHLORIDE 0.9 % IV SOLN
1200.0000 mg | Freq: Once | INTRAVENOUS | Status: AC
  Administered 2019-09-14: 1200 mg via INTRAVENOUS
  Filled 2019-09-14: qty 20

## 2019-09-14 MED ORDER — SODIUM CHLORIDE 0.9% FLUSH
10.0000 mL | Freq: Once | INTRAVENOUS | Status: AC
  Administered 2019-09-14: 10 mL via INTRAVENOUS
  Filled 2019-09-14: qty 10

## 2019-09-14 MED ORDER — SODIUM CHLORIDE 0.9 % IV SOLN
Freq: Once | INTRAVENOUS | Status: AC
  Administered 2019-09-14: 10:00:00 via INTRAVENOUS
  Filled 2019-09-14: qty 250

## 2019-09-14 MED ORDER — HEPARIN SOD (PORK) LOCK FLUSH 100 UNIT/ML IV SOLN
500.0000 [IU] | Freq: Once | INTRAVENOUS | Status: AC | PRN
  Administered 2019-09-14: 11:00:00 500 [IU]
  Filled 2019-09-14: qty 5

## 2019-09-14 NOTE — Progress Notes (Signed)
Hematology/Oncology Follow up note [facility] Telephone:([phone] [phone]   Patient Care Team: Glean Hess, MD as PCP - General (Internal Medicine) Charolette Forward, MD as Consulting Physician (Cardiology) Telford Nab, RN as Registered Nurse  REFERRING PROVIDER: Dr. Felicie Morn REASON FOR VISIT:  Follow-up for small cell lung cancer,   HISTORY OF PRESENTING ILLNESS:  Kristi [name] is a  73 y.o.  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  10/20/2018 CT chest with contrast showed large mediastinal mass involving both hilar, left greater than right, consistent with lung carcinoma, The mass causes narrowing of the left mainstem bronchus with resultant volume loss on the left and a mediastinal shift to the left.  Moderate size left pleural effusion. 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  # Nov 2019- Jan 2020 s/p 4 cycles of Carbo/Etoposide/Tecentriq #  01/24/2019 interim CT scan done which showed continued positive response to therapy with continued reduction in mediastinal adenopathy.  No residual measurable left lung mass. CT findings of acute emphysematous cystitis. Urology did not feel that patient has pyelonephritis and recommend treatment with antibiotics because patient's immunocompromise. Patient finished treatment.   # Chest and whole brain radiation finished in May 2020 # 04/25/2019 resume Tecentriq every 3 weeks.  INTERVAL HISTORY Kristi [name] is a 73 y.o. female who has above history reviewed by me today presents for evaluation prior to immunotherapy for treatment of extensive small cell lung cancer. Patient reports feeling well today.  Denies any dizziness, fever, dysuria, chest pain, abdominal pain, shortness of breath, skin rash or diarrhea. Occasionally she has cough, with some thick  mucus, clear. Chronic fatigue at baseline.     Review of Systems  Constitutional: Positive for fatigue. Negative for appetite change, chills and fever.  HENT:   Negative for hearing loss and voice change.   Eyes: Negative for eye problems.  Respiratory: Positive for cough. Negative for chest tightness and shortness of breath.   Cardiovascular: Negative for chest pain.  Gastrointestinal: Negative for abdominal distention, abdominal pain, blood in stool, diarrhea and nausea.  Endocrine: Negative for hot flashes.  Genitourinary: Negative for difficulty urinating and frequency.   Musculoskeletal: Negative for arthralgias.  Skin: Negative for itching and rash.  Neurological: Negative for extremity weakness.  Hematological: Negative for adenopathy.  Psychiatric/Behavioral: Negative for confusion and depression. The patient is not nervous/anxious.        Forgetful     MEDICAL HISTORY:  Past Medical History:  Diagnosis Date  . Acute MI, inferoposterior wall ([place]) 09/30/2014  . Claustrophobia   . Coronary artery disease   . Hypercholesteremia   . Hypertension   . MI, old   . 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 [facility]) 10/27/2018    SURGICAL HISTORY: Past Surgical History:  Procedure Laterality Date  . APPENDECTOMY     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  . BLADDER NECK SUSPENSION    . CHOLECYSTECTOMY N/A 08/14/2018   Procedure: LAPAROSCOPIC CHOLECYSTECTOMY;  Surgeon: Jules Husbands, MD;  Location: [facility] ORS;  Service: General;  Laterality: N/A;  . CHOLECYSTECTOMY  11/2018  . CORONARY STENT PLACEMENT    . ENDOBRONCHIAL ULTRASOUND N/A 10/21/2018   Procedure: ENDOBRONCHIAL ULTRASOUND;  Surgeon: Laverle Hobby, MD;  Location: [facility] ORS;  Service: Pulmonary;  Laterality: N/A;  . HERNIA REPAIR    . IR FLUORO GUIDE CV LINE RIGHT  12/19/2018  .  LEFT HEART CATHETERIZATION WITH CORONARY ANGIOGRAM N/A 09/29/2014   Procedure: LEFT HEART CATHETERIZATION WITH CORONARY ANGIOGRAM;  Surgeon: Clent Demark, MD;  Location: [facility] CATH LAB;  Service: Cardiovascular;  Laterality: N/A;  . PORTA CATH INSERTION N/A 01/02/2019   Procedure: PORTA CATH INSERTION;  Surgeon: Algernon Huxley, MD;  Location: [place] CV LAB;  Service: Cardiovascular;  Laterality: N/A;  . PORTACATH PLACEMENT Right 10/28/2018   Procedure: INSERTION PORT-A-CATH;  Surgeon: Jules Husbands, MD;  Location: [facility] ORS;  Service: General;  Laterality: Right;    SOCIAL HISTORY: Social History   Socioeconomic History  . Marital status: Married    Spouse name: [name]   . Number of children: 3  . Years of education: Not on file  . Highest education level: Not on file  Occupational History  . Occupation: Retired    Comment: Chief Technology Officer   Social Needs  . Financial resource strain: Not very hard  . Food insecurity    Worry: Not on file    Inability: Not on file  . Transportation needs    Medical: No    Non-medical: No  Tobacco Use  . Smoking status: Former Smoker    Packs/day: 1.00    Years: 39.00    Pack years: 39.00    Types: Cigarettes    Start date: 12/21/1978    Quit date: 10/16/2018    Years since quitting: 0.9  . Smokeless tobacco: Never Used  Substance and Sexual Activity  . Alcohol use: Yes    Alcohol/week: 0.0 standard drinks    Comment: occassional - approx 1 every 2 weeks   . Drug use: No  . Sexual activity: Yes    Birth control/protection: None  Lifestyle  . Physical activity    Days per week: 0 days    Minutes per session: Not on file  . Stress: To some extent  Relationships  . Social Herbalist on phone: Three times a week    Gets together: Once a week    Attends religious service: 1 to 4 times per year    Active member of club or organization: Not on file    Attends meetings of clubs or organizations: Not on file    Relationship status: Married  . Intimate partner violence    Fear of current or ex partner: No    Emotionally abused: No    Physically abused: No    Forced sexual  activity: No  Other Topics Concern  . Not on file  Social History Narrative  . Not on file    FAMILY HISTORY: Family History  Problem Relation Age of Onset  . Alzheimer's disease Mother   . Colon cancer Father   . Breast cancer Neg Hx     ALLERGIES:  has No Known Allergies.  MEDICATIONS:  Current Outpatient Medications  Medication Sig Dispense Refill  . atorvastatin (LIPITOR) 80 MG tablet Take 0.5 tablets (40 mg total) by mouth daily at 6 PM. 30 tablet 3  . B Complex Vitamins (VITAMIN B COMPLEX PO) Take 1 tablet by mouth daily.     [name] Kitchen CALCIUM PO Take 1 tablet by mouth daily.     . Cholecalciferol (VITAMIN D) 2000 units CAPS Take 1 capsule (2,000 Units total) by mouth daily. 30 capsule   . dexamethasone (DECADRON) 4 MG tablet Take 1 tablet (4 mg total) by mouth daily. 25 tablet 0  . diphenhydrAMINE-zinc acetate (BENADRYL EXTRA STRENGTH) cream Apply 1 application topically 3 (  three) times daily as needed for itching. 28.4 g 0  . HYDROcodone-acetaminophen (NORCO/VICODIN) 5-325 MG tablet Take 1 tablet by mouth every 6 (six) hours as needed for moderate pain. 30 tablet 0  . lidocaine-prilocaine (EMLA) cream Apply to affected area once 30 g 3  . metoprolol tartrate (LOPRESSOR) 25 MG tablet Take 12.5 mg by mouth 2 (two) times daily.    . nitroGLYCERIN (NITROSTAT) 0.4 MG SL tablet Place 1 tablet (0.4 mg total) under the tongue every 5 (five) minutes x 3 doses as needed for chest pain. 25 tablet 12  . omeprazole (PRILOSEC) 40 MG capsule TAKE ONE CAPSULE BY MOUTH DAILY 90 capsule 1  . ondansetron (ZOFRAN) 8 MG tablet Take 1 tablet (8 mg total) by mouth 2 (two) times daily as needed for refractory nausea / vomiting. Start on day 3 after carboplatin chemo. 30 tablet 1  . oxyCODONE (OXYCONTIN) 10 mg 12 hr tablet Take 1 tablet (10 mg total) by mouth every 12 (twelve) hours. 60 tablet 0  . polyethylene glycol (MIRALAX / GLYCOLAX) packet Take 17 g by mouth daily as needed.     . prochlorperazine  (COMPAZINE) 10 MG tablet TAKE 1 TABLET BY MOUTH EVERY 6 HOURS AS NEEDED FOR NAUSEA OR VOMITING 270 tablet 1  . sertraline (ZOLOFT) 50 MG tablet Take 1.5 tablets (75 mg total) by mouth daily. 45 tablet 2  . triamterene-hydrochlorothiazide (DYAZIDE) 37.5-25 MG capsule Take 1 each (1 capsule total) by mouth daily as needed. 90 capsule 0   No current facility-administered medications for this visit.    Facility-Administered Medications Ordered in Other Visits  Medication Dose Route Frequency Provider Last Rate Last Dose  . heparin lock flush 100 unit/mL  500 Units Intravenous Once Earlie Server, MD      . heparin lock flush 100 unit/mL  500 Units Intravenous Once Earlie Server, MD      . sodium chloride flush (NS) 0.9 % injection 10 mL  10 mL Intravenous PRN Earlie Server, MD   10 mL at 01/09/19 0820  . sodium chloride flush (NS) 0.9 % injection 10 mL  10 mL Intravenous PRN Earlie Server, MD   10 mL at 01/10/19 1400     PHYSICAL EXAMINATION: ECOG PERFORMANCE STATUS: 1 - Symptomatic but completely ambulatory Vitals:   09/14/19 0908  BP: 101/82  Pulse: 92  Resp: 18  Temp: 98.3 F (36.8 C)  SpO2: 99%   Filed Weights   09/14/19 0908  Weight: 147 lb 8 oz (66.9 kg)   Physical Exam  Constitutional: She is oriented to person, place, and time. No distress.  HENT:  Head: Normocephalic and atraumatic.  Nose: Nose normal.  Mouth/Throat: Oropharynx is clear and moist. No oropharyngeal exudate.  Eyes: Pupils are equal, round, and reactive to light. EOM are normal. No scleral icterus.  Neck: Normal range of motion. Neck supple.  Cardiovascular: Normal rate and regular rhythm.  No murmur heard. Pulmonary/Chest: Effort normal. No respiratory distress. She has no rales. She exhibits no tenderness.  Decreased breath sounds bilaterally, right side breath sound is more diminished.   Abdominal: Soft. She exhibits no distension. There is no abdominal tenderness.  Musculoskeletal: Normal range of motion.        General:  No edema.  Neurological: She is alert and oriented to person, place, and time. No cranial nerve deficit. She exhibits normal muscle tone. Coordination normal.  Skin: Skin is warm and dry. She is not diaphoretic. No erythema.  Psychiatric: Affect normal.  LABORATORY DATA:  I have reviewed the data as listed Lab Results  Component Value Date   WBC 5.2 09/14/2019   HGB 11.7 (L) 09/14/2019   HCT 35.2 (L) 09/14/2019   MCV 88.9 09/14/2019   PLT 164 09/14/2019   Recent Labs    08/03/19 0935 08/24/19 0905 09/14/19 0846  NA 135 135 136  K 4.0 4.1 4.1  CL 101 103 103  CO2 24 25 23   GLUCOSE 101* 104* 103*  BUN 10 13 17   CREATININE 0.69 0.55 0.78  CALCIUM 9.4 9.3 9.2  GFRNONAA >60 >60 >60  GFRAA >60 >60 >60  PROT 6.7 7.1 6.8  ALBUMIN 4.0 4.0 3.9  AST 16 14* 18  ALT 13 12 14   ALKPHOS 70 64 65  BILITOT 0.6 0.6 0.5    RADIOGRAPHIC STUDIES: I have personally reviewed the radiological images as listed and agreed with the findings in the report. 10/20/2018 CT chest w contrast . Large mediastinal mass involving both hila left much greater than right consistent with lung carcinoma possibly small cell lung carcinoma. This mass causes narrowing of the left mainstem bronchus with resultant volume loss on the left and mediastinal shift to the Left. 2. Moderate size left pleural effusion. 3. Coronary artery calcifications and moderate thoracic aortic atherosclerosis. PET 10/26/2018 . Large left lung mass is markedly hypermetabolic in the confluent lymphadenopathy in the mediastinum and in bilateral hilar regions also shows marked hypermetabolism. 2. Hypermetabolic metastatic lymphadenopathy in the right neck and supraclavicular region. 3. Hypermetabolic lymphadenopathy in the hepato duodenal ligament consistent with metastatic disease. 4. Large hypermetabolic lesion posterior right acetabulum without correlate of CT finding. Features highly suspicious for bony metastatic disease. MRI brain  10/28/2018 1. No metastatic disease or acute intracranial abnormality identified. 2. 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  3. Moderately advanced but nonspecific cerebral white matter signal changes, most commonly due to chronic small vessel disease. Lesser signal changes in the deep gray matter and pons may also be small vessel related.  CT chest abdomen pelvis with contrast 12/15/2018 1 Marked response to therapy of thoracic adenopathy. 2. No new or progressive disease in the chest. 3. Coronary artery atherosclerosis. 4. 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 5. No new or progressive disease in the abdomen or pelvis 6. Pelvic floor laxity, cystocele with bladder wall thickening, suggesting a component of outlet obstruction. 7. Possible vague sclerosis within the posterior right acetabulum, in the region of hypermetabolism on prior PET. Likely an area of treated metastasis. 8. Central line tip in right brachiocephalic vein with surrounding occlusive thrombus. CT chest abdomen pelvis with contrast 01/24/2019 1. New extensive gas throughout the bladder wall and within the bladder lumen with underlying mild diffuse bladder wall thickening, compatible with acute emphysematous cystitis. 2. Two new subcentimeter low-attenuation lesions in the right kidney, nonspecific but worrisome for mild acute right pyelonephritis given the above bladder findings. No discrete/drainable renal abscess. No hydronephrosis. 3. Continued positive response to therapy with continued reduction in mediastinal adenopathy. No residual measurable left lung mass.Stable faint sclerosis at the site of the hypermetabolic right acetabular osseous lesion. No new or progressive metastatic disease in the chest, abdomen or pelvis. 4. Small bowel containing small periumbilical hernia without bowel complication at this time. 5. Aortic Atherosclerosis (ICD10-I70.0) and Emphysema (ICD10-J43.9).  05/05/2019 CT  chest abdomen pelvis with contrast Near complete resolution of prior mediastinal lymphadenopathy.  Residual 12 mm short axis subcarinal node. Radiation changes in the lungs bilaterally.  Left lower  lobe/lingular scarring.  No suspicious pulmonary nodules.  No evidence of metastatic disease in the abdomen or pelvis.   ASSESSMENT & PLAN:  1. Small cell lung cancer ([place])   2. Encounter for antineoplastic immunotherapy   3. Memory loss   4. Other fatigue    #Extensive small cell lung cancer S/p 4 cycles of Carboplatin, Etoposide and Tecentriq.   Status post chest and whole brain radiation.  Labs are reviewed and discussed with patient.  Proceed with Tecentriq. Clinically she is doing well on Tecentriq maintenance. Plan repeat chest abdomen pelvis and MRI brain October/November 2020.  #Hiccups, resolved. #Chronic fatigue, TSH has been followed.  Normal cortisol level  #Forgetfulness, likely secondary to whole brain radiation.  Depression has been well controlled.  Continue Zoloft.. Follow-up with palliative care.  Follow up in 3 weeks for eval with next cycle of tecentriq.    Earlie Server, MD, PhD

## 2019-09-14 NOTE — Progress Notes (Signed)
Patient is here for follow up she mentions when she coughs her mucus is clear and has gotten thicker.

## 2019-09-21 ENCOUNTER — Ambulatory Visit: Payer: Medicare Other | Admitting: Internal Medicine

## 2019-10-02 ENCOUNTER — Other Ambulatory Visit: Payer: Self-pay | Admitting: *Deleted

## 2019-10-02 DIAGNOSIS — C3490 Malignant neoplasm of unspecified part of unspecified bronchus or lung: Secondary | ICD-10-CM

## 2019-10-03 NOTE — Telephone Encounter (Signed)
This is being requested a week early

## 2019-10-03 NOTE — Telephone Encounter (Signed)
Dr. Tasia Catchings is seeing patient this week. I will refuse this refill request and patient can discuss pain medication regimen with Dr. Tasia Catchings at her appointment.

## 2019-10-03 NOTE — Telephone Encounter (Signed)
Patient hasn't filled since 08/10/2019.

## 2019-10-03 NOTE — Telephone Encounter (Signed)
Called patient and discussed her refill request and she has not refilled Norco since May and Oxycontin since August. I sent message to Dr. Tasia Catchings to clarify and am waiting to hear back before sending refill.

## 2019-10-04 ENCOUNTER — Other Ambulatory Visit: Payer: Self-pay

## 2019-10-04 NOTE — Progress Notes (Signed)
Patient pre screened for office appointment, no questions or concerns today. 

## 2019-10-05 ENCOUNTER — Other Ambulatory Visit: Payer: Self-pay

## 2019-10-05 ENCOUNTER — Inpatient Hospital Stay: Payer: Medicare Other | Attending: Oncology

## 2019-10-05 ENCOUNTER — Inpatient Hospital Stay (HOSPITAL_BASED_OUTPATIENT_CLINIC_OR_DEPARTMENT_OTHER): Payer: Medicare Other | Admitting: Oncology

## 2019-10-05 ENCOUNTER — Inpatient Hospital Stay: Payer: Medicare Other

## 2019-10-05 ENCOUNTER — Ambulatory Visit: Admission: RE | Admit: 2019-10-05 | Payer: Medicare Other | Source: Ambulatory Visit

## 2019-10-05 ENCOUNTER — Encounter: Payer: Self-pay | Admitting: Oncology

## 2019-10-05 ENCOUNTER — Ambulatory Visit
Admission: RE | Admit: 2019-10-05 | Discharge: 2019-10-05 | Disposition: A | Discharge status: Home / Self Care | Payer: Medicare Other | Source: Ambulatory Visit | Attending: Radiation Oncology | Admitting: Radiation Oncology

## 2019-10-05 VITALS — BP 135/85 | HR 83 | Temp 97.7°F | Resp 18 | Wt 148.7 lb

## 2019-10-05 DIAGNOSIS — C3490 Malignant neoplasm of unspecified part of unspecified bronchus or lung: Secondary | ICD-10-CM

## 2019-10-05 DIAGNOSIS — R413 Other amnesia: Secondary | ICD-10-CM

## 2019-10-05 DIAGNOSIS — Z923 Personal history of irradiation: Secondary | ICD-10-CM | POA: Insufficient documentation

## 2019-10-05 DIAGNOSIS — F17210 Nicotine dependence, cigarettes, uncomplicated: Secondary | ICD-10-CM | POA: Diagnosis not present

## 2019-10-05 DIAGNOSIS — Z79899 Other long term (current) drug therapy: Secondary | ICD-10-CM | POA: Diagnosis not present

## 2019-10-05 DIAGNOSIS — R5383 Other fatigue: Secondary | ICD-10-CM | POA: Diagnosis not present

## 2019-10-05 DIAGNOSIS — Z85118 Personal history of other malignant neoplasm of bronchus and lung: Secondary | ICD-10-CM | POA: Diagnosis not present

## 2019-10-05 DIAGNOSIS — Z87891 Personal history of nicotine dependence: Secondary | ICD-10-CM | POA: Insufficient documentation

## 2019-10-05 DIAGNOSIS — Z5112 Encounter for antineoplastic immunotherapy: Secondary | ICD-10-CM

## 2019-10-05 DIAGNOSIS — Z5111 Encounter for antineoplastic chemotherapy: Secondary | ICD-10-CM | POA: Insufficient documentation

## 2019-10-05 DIAGNOSIS — Z95828 Presence of other vascular implants and grafts: Secondary | ICD-10-CM

## 2019-10-05 LAB — CBC WITH DIFFERENTIAL/PLATELET
Abs Immature Granulocytes: 0.03 10*3/uL (ref 0.00–0.07)
Basophils Absolute: 0 10*3/uL (ref 0.0–0.1)
Basophils Relative: 1 %
Eosinophils Absolute: 0.2 10*3/uL (ref 0.0–0.5)
Eosinophils Relative: 3 %
HCT: 34.6 % — ABNORMAL LOW (ref 36.0–46.0)
Hemoglobin: 11.8 g/dL — ABNORMAL LOW (ref 12.0–15.0)
Immature Granulocytes: 1 %
Lymphocytes Relative: 24 %
Lymphs Abs: 1.3 10*3/uL (ref 0.7–4.0)
MCH: 29.9 pg (ref 26.0–34.0)
MCHC: 34.1 g/dL (ref 30.0–36.0)
MCV: 87.6 fL (ref 80.0–100.0)
Monocytes Absolute: 0.7 10*3/uL (ref 0.1–1.0)
Monocytes Relative: 13 %
Neutro Abs: 3.1 10*3/uL (ref 1.7–7.7)
Neutrophils Relative %: 58 %
Platelets: 150 10*3/uL (ref 150–400)
RBC: 3.95 MIL/uL (ref 3.87–5.11)
RDW: 13.6 % (ref 11.5–15.5)
WBC: 5.3 10*3/uL (ref 4.0–10.5)
nRBC: 0 % (ref 0.0–0.2)

## 2019-10-05 LAB — COMPREHENSIVE METABOLIC PANEL
ALT: 12 U/L (ref 0–44)
AST: 13 U/L — ABNORMAL LOW (ref 15–41)
Albumin: 3.9 g/dL (ref 3.5–5.0)
Alkaline Phosphatase: 75 U/L (ref 38–126)
Anion gap: 6 (ref 5–15)
BUN: 15 mg/dL (ref 8–23)
CO2: 25 mmol/L (ref 22–32)
Calcium: 9.3 mg/dL (ref 8.9–10.3)
Chloride: 104 mmol/L (ref 98–111)
Creatinine, Ser: 0.66 mg/dL (ref 0.44–1.00)
GFR calc Af Amer: >60 mL/min (ref >60)
GFR calc non Af Amer: >60 mL/min (ref >60)
Glucose, Bld: 101 mg/dL — ABNORMAL HIGH (ref 70–99)
Potassium: 3.8 mmol/L (ref 3.5–5.1)
Sodium: 135 mmol/L (ref 135–145)
Total Bilirubin: 0.8 mg/dL (ref 0.3–1.2)
Total Protein: 7 g/dL (ref 6.5–8.1)

## 2019-10-05 MED ORDER — SODIUM CHLORIDE 0.9% FLUSH
10.0000 mL | Freq: Once | INTRAVENOUS | Status: AC
  Administered 2019-10-05: 10 mL via INTRAVENOUS
  Filled 2019-10-05: qty 10

## 2019-10-05 MED ORDER — DEXAMETHASONE SODIUM PHOSPHATE 10 MG/ML IJ SOLN
10.0000 mg | Freq: Once | INTRAMUSCULAR | Status: AC
  Administered 2019-10-05: 10 mg via INTRAVENOUS
  Filled 2019-10-05: qty 1

## 2019-10-05 MED ORDER — HYDROCODONE-ACETAMINOPHEN 5-325 MG PO TABS: 1.0000 | ORAL_TABLET | Freq: Every day | ORAL | 0 refills | Status: DC | PRN

## 2019-10-05 MED ORDER — SODIUM CHLORIDE 0.9 % IV SOLN
Freq: Once | INTRAVENOUS | Status: AC
  Administered 2019-10-05: 10:00:00 via INTRAVENOUS
  Filled 2019-10-05: qty 250

## 2019-10-05 MED ORDER — HEPARIN SOD (PORK) LOCK FLUSH 100 UNIT/ML IV SOLN
500.0000 [IU] | Freq: Once | INTRAVENOUS | Status: AC | PRN
  Administered 2019-10-05: 12:00:00 500 [IU]
  Filled 2019-10-05: qty 5

## 2019-10-05 MED ORDER — SODIUM CHLORIDE 0.9 % IV SOLN
1200.0000 mg | Freq: Once | INTRAVENOUS | Status: AC
  Administered 2019-10-05: 1200 mg via INTRAVENOUS
  Filled 2019-10-05: qty 20

## 2019-10-05 NOTE — Progress Notes (Signed)
Radiation Oncology Follow up Note  [facility]: [name]   Date:   10/05/2019 MRN:  [id number] DOB: 20-Feb-1946    This 73 y.o. female presents to the clinic today for 29-month follow-up status post both concurrent chemoradiation therapy as well as PCI for extensive stage small cell lung cancer.  REFERRING PROVIDER: Glean Hess, MD  HPI: Patient is a 73 year old female now out 5 months having completed both PCI as well as concurrent palliative chemoradiation for extensive stage small cell lung cancer.  Seen today in routine follow-up she is doing fairly well she specifically denies cough hemoptysis or chest tightness she states she is had some forgetfulness that is improving..  She also has receivedCarboplatin, Etoposide and Tecentriq and continues on maintenance Tecentriq.  Her most recent CT scans back in August shows no evidence of cancer in the chest abdomen pelvis or bone.  COMPLICATIONS OF TREATMENT: none  FOLLOW UP COMPLIANCE: keeps appointments   PHYSICAL EXAM:  There were no vitals taken for this visit. Well-developed well-nourished patient in NAD. HEENT reveals PERLA, EOMI, discs not visualized.  Oral cavity is clear. No oral mucosal lesions are identified. Neck is clear without evidence of cervical or supraclavicular adenopathy. Lungs are clear to A&P. Cardiac examination is essentially unremarkable with regular rate and rhythm without murmur rub or thrill. Abdomen is benign with no organomegaly or masses noted. Motor sensory and DTR levels are equal and symmetric in the upper and lower extremities. Cranial nerves II through XII are grossly intact. Proprioception is intact. No peripheral adenopathy or edema is identified. No motor or sensory levels are noted. Crude visual fields are within normal range.  RADIOLOGY RESULTS: CT scans reviewed compatible with the above-stated findings  PLAN: Present time patient is doing well with no evidence of disease.  She continues on  maintenance Tecentriq under medical oncology's direction.  I have asked to see her back in 6 months for follow-up.  Of asked any follow-up films to be copy to me for my review.  Patient knows to call with any concerns at any time.  I would like to take this opportunity to thank you for allowing me to participate in the care of your patient.[name], MD

## 2019-10-05 NOTE — Progress Notes (Signed)
Hematology/Oncology Follow up note [facility] [phone] [phone]   Patient Care Team: Glean Hess, MD as PCP - General (Internal Medicine) Charolette Forward, MD as Consulting Physician (Cardiology) Telford Nab, RN as Registered Nurse  REFERRING PROVIDER: Dr. Felicie Morn REASON FOR VISIT:  Follow-up for small cell lung cancer,   HISTORY OF PRESENTING ILLNESS:  Kristi [name] is a  73 y.o.  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  10/20/2018 CT chest with contrast showed large mediastinal mass involving both hilar, left greater than right, consistent with lung carcinoma, The mass causes narrowing of the left mainstem bronchus with resultant volume loss on the left and a mediastinal shift to the left.  Moderate size left pleural effusion. 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  # MRI brain negative.  # Nov 2019- Jan 2020 s/p 4 cycles of Carbo/Etoposide/Tecentriq #  01/24/2019 interim CT scan done which showed continued positive response to therapy with continued reduction in mediastinal adenopathy.  No residual measurable left lung mass. CT findings of acute emphysematous cystitis. Urology did not feel that patient has pyelonephritis and recommend treatment with antibiotics because patient's immunocompromise. Patient finished treatment.   # Chest and whole brain radiation finished in May 2020 # 04/25/2019 resume Tecentriq every 3 weeks.  INTERVAL HISTORY Kristi [name] is a 73 y.o. female who has above history reviewed by me today presents for evaluation prior to immunotherapy for treatment of extensive small cell lung cancer. Currently patient is on immunotherapy maintenance. She reports doing well today. Denies any dizziness, nausea, vomiting, fever, dysuria, chest pain, abdominal pain,  shortness of breath, dysphagia, skin rash or diarrhea. Occasional cough, chronic. Chronic fatigue at baseline.    Review of Systems  Constitutional: Positive for fatigue. Negative for appetite change, chills and fever.  HENT:   Negative for hearing loss and voice change.   Eyes: Negative for eye problems.  Respiratory: Negative for chest tightness, cough and shortness of breath.   Cardiovascular: Negative for chest pain.  Gastrointestinal: Negative for abdominal distention, abdominal pain, blood in stool, diarrhea and nausea.  Endocrine: Negative for hot flashes.  Genitourinary: Negative for difficulty urinating and frequency.   Musculoskeletal: Negative for arthralgias.  Skin: Negative for itching and rash.  Neurological: Negative for extremity weakness.  Hematological: Negative for adenopathy.  Psychiatric/Behavioral: Negative for confusion and depression. The patient is not nervous/anxious.        Forgetful     MEDICAL HISTORY:  Past Medical History:  Diagnosis Date  . Acute MI, inferoposterior wall ([place]) 09/30/2014  . Claustrophobia   . Coronary artery disease   . Hypercholesteremia   . Hypertension   . MI, old   . 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 [facility]) 10/27/2018    SURGICAL HISTORY: Past Surgical History:  Procedure Laterality Date  . APPENDECTOMY     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  . BLADDER NECK SUSPENSION    . CHOLECYSTECTOMY N/A 08/14/2018   Procedure: LAPAROSCOPIC CHOLECYSTECTOMY;  Surgeon: Jules Husbands, MD;  Location: [facility] ORS;  Service: General;  Laterality: N/A;  . CHOLECYSTECTOMY  11/2018  . CORONARY STENT PLACEMENT    . ENDOBRONCHIAL ULTRASOUND N/A 10/21/2018   Procedure: ENDOBRONCHIAL ULTRASOUND;  Surgeon: Laverle Hobby, MD;  Location: [facility] ORS;  Service: Pulmonary;  Laterality: N/A;  . HERNIA REPAIR    . IR FLUORO GUIDE CV LINE  RIGHT  12/19/2018  . LEFT HEART CATHETERIZATION WITH CORONARY ANGIOGRAM N/A 09/29/2014   Procedure: LEFT HEART  CATHETERIZATION WITH CORONARY ANGIOGRAM;  Surgeon: Clent Demark, MD;  Location: [facility] CATH LAB;  Service: Cardiovascular;  Laterality: N/A;  . PORTA CATH INSERTION N/A 01/02/2019   Procedure: PORTA CATH INSERTION;  Surgeon: Algernon Huxley, MD;  Location: [place] CV LAB;  Service: Cardiovascular;  Laterality: N/A;  . PORTACATH PLACEMENT Right 10/28/2018   Procedure: INSERTION PORT-A-CATH;  Surgeon: Jules Husbands, MD;  Location: [facility] ORS;  Service: General;  Laterality: Right;    SOCIAL HISTORY: Social History   Socioeconomic History  . Marital status: Married    Spouse name: [name]   . Number of children: 3  . Years of education: Not on file  . Highest education level: Not on file  Occupational History  . Occupation: Retired    Comment: Chief Technology Officer   Social Needs  . Financial resource strain: Not very hard  . Food insecurity    Worry: Not on file    Inability: Not on file  . Transportation needs    Medical: No    Non-medical: No  Tobacco Use  . Smoking status: Former Smoker    Packs/day: 1.00    Years: 39.00    Pack years: 39.00    Types: Cigarettes    Start date: 12/21/1978    Quit date: 10/16/2018    Years since quitting: 0.9  . Smokeless tobacco: Never Used  Substance and Sexual Activity  . Alcohol use: Yes    Alcohol/week: 0.0 standard drinks    Comment: occassional - approx 1 every 2 weeks   . Drug use: No  . Sexual activity: Yes    Birth control/protection: None  Lifestyle  . Physical activity    Days per week: 0 days    Minutes per session: Not on file  . Stress: To some extent  Relationships  . Social Herbalist on phone: Three times a week    Gets together: Once a week    Attends religious service: 1 to 4 times per year    Active member of club or organization: Not on file    Attends meetings of clubs or organizations: Not on file    Relationship status: Married  . Intimate partner violence    Fear of current or ex partner: No     Emotionally abused: No    Physically abused: No    Forced sexual activity: No  Other Topics Concern  . Not on file  Social History Narrative  . Not on file    FAMILY HISTORY: Family History  Problem Relation Age of Onset  . Alzheimer's disease Mother   . Colon cancer Father   . Breast cancer Neg Hx     ALLERGIES:  has No Known Allergies.  MEDICATIONS:  Current Outpatient Medications  Medication Sig Dispense Refill  . atorvastatin (LIPITOR) 80 MG tablet Take 0.5 tablets (40 mg total) by mouth daily at 6 PM. 30 tablet 3  . B Complex Vitamins (VITAMIN B COMPLEX PO) Take 1 tablet by mouth daily.     [name] Kitchen CALCIUM PO Take 1 tablet by mouth daily.     . Cholecalciferol (VITAMIN D) 2000 units CAPS Take 1 capsule (2,000 Units total) by mouth daily. 30 capsule   . dexamethasone (DECADRON) 4 MG tablet Take 1 tablet (4 mg total) by mouth daily. 25 tablet 0  . diphenhydrAMINE-zinc acetate (BENADRYL EXTRA STRENGTH) cream  Apply 1 application topically 3 (three) times daily as needed for itching. 28.4 g 0  . HYDROcodone-acetaminophen (NORCO/VICODIN) 5-325 MG tablet Take 1 tablet by mouth daily as needed for moderate pain. 30 tablet 0  . lidocaine-prilocaine (EMLA) cream Apply to affected area once 30 g 3  . metoprolol tartrate (LOPRESSOR) 25 MG tablet Take 12.5 mg by mouth 2 (two) times daily.    . nitroGLYCERIN (NITROSTAT) 0.4 MG SL tablet Place 1 tablet (0.4 mg total) under the tongue every 5 (five) minutes x 3 doses as needed for chest pain. 25 tablet 12  . omeprazole (PRILOSEC) 40 MG capsule TAKE ONE CAPSULE BY MOUTH DAILY 90 capsule 1  . ondansetron (ZOFRAN) 8 MG tablet Take 1 tablet (8 mg total) by mouth 2 (two) times daily as needed for refractory nausea / vomiting. Start on day 3 after carboplatin chemo. 30 tablet 1  . polyethylene glycol (MIRALAX / GLYCOLAX) packet Take 17 g by mouth daily as needed.     . potassium chloride SA (KLOR-CON) 20 MEQ tablet     . prochlorperazine (COMPAZINE) 10  MG tablet TAKE 1 TABLET BY MOUTH EVERY 6 HOURS AS NEEDED FOR NAUSEA OR VOMITING 270 tablet 1  . sertraline (ZOLOFT) 50 MG tablet Take 1.5 tablets (75 mg total) by mouth daily. 45 tablet 2  . triamterene-hydrochlorothiazide (DYAZIDE) 37.5-25 MG capsule Take 1 each (1 capsule total) by mouth daily as needed. 90 capsule 0   No current facility-administered medications for this visit.    Facility-Administered Medications Ordered in Other Visits  Medication Dose Route Frequency Provider Last Rate Last Dose  . heparin lock flush 100 unit/mL  500 Units Intravenous Once Earlie Server, MD      . heparin lock flush 100 unit/mL  500 Units Intravenous Once Earlie Server, MD      . sodium chloride flush (NS) 0.9 % injection 10 mL  10 mL Intravenous PRN Earlie Server, MD   10 mL at 01/09/19 0820  . sodium chloride flush (NS) 0.9 % injection 10 mL  10 mL Intravenous PRN Earlie Server, MD   10 mL at 01/10/19 1400     PHYSICAL EXAMINATION: ECOG PERFORMANCE STATUS: 1 - Symptomatic but completely ambulatory Vitals:   10/05/19 0909  BP: 135/85  Pulse: 83  Resp: 18  Temp: 97.7 F (36.5 C)   Filed Weights   10/05/19 0909  Weight: 148 lb 11.2 oz (67.4 kg)   Physical Exam  Constitutional: She is oriented to person, place, and time. No distress.  HENT:  Head: Normocephalic and atraumatic.  Nose: Nose normal.  Mouth/Throat: Oropharynx is clear and moist. No oropharyngeal exudate.  Eyes: Pupils are equal, round, and reactive to light. EOM are normal. No scleral icterus.  Neck: Normal range of motion. Neck supple.  Cardiovascular: Normal rate and regular rhythm.  No murmur heard. Pulmonary/Chest: Effort normal. No respiratory distress. She has no rales. She exhibits no tenderness.  Decreased breath sounds bilaterally, right side breath sound is more diminished.   Abdominal: Soft. She exhibits no distension. There is no abdominal tenderness.  Musculoskeletal: Normal range of motion.        General: No edema.   Neurological: She is alert and oriented to person, place, and time. No cranial nerve deficit. She exhibits normal muscle tone. Coordination normal.  Skin: Skin is warm and dry. She is not diaphoretic. No erythema.  Psychiatric: Affect normal.       LABORATORY DATA:  I have reviewed the data as  listed Lab Results  Component Value Date   WBC 5.3 10/05/2019   HGB 11.8 (L) 10/05/2019   HCT 34.6 (L) 10/05/2019   MCV 87.6 10/05/2019   PLT 150 10/05/2019   Recent Labs    08/24/19 0905 09/14/19 0846 10/05/19 0900  NA 135 136 135  K 4.1 4.1 3.8  CL 103 103 104  CO2 25 23 25   GLUCOSE 104* 103* 101*  BUN 13 17 15   CREATININE 0.55 0.78 0.66  CALCIUM 9.3 9.2 9.3  GFRNONAA >60 >60 >60  GFRAA >60 >60 >60  PROT 7.1 6.8 7.0  ALBUMIN 4.0 3.9 3.9  AST 14* 18 13*  ALT 12 14 12   ALKPHOS 64 65 75  BILITOT 0.6 0.5 0.8    RADIOGRAPHIC STUDIES: I have personally reviewed the radiological images as listed and agreed with the findings in the report. 10/20/2018 CT chest w contrast . Large mediastinal mass involving both hila left much greater than right consistent with lung carcinoma possibly small cell lung carcinoma. This mass causes narrowing of the left mainstem bronchus with resultant volume loss on the left and mediastinal shift to the Left. 2. Moderate size left pleural effusion. 3. Coronary artery calcifications and moderate thoracic aortic atherosclerosis. PET 10/26/2018 . Large left lung mass is markedly hypermetabolic in the confluent lymphadenopathy in the mediastinum and in bilateral hilar regions also shows marked hypermetabolism. 2. Hypermetabolic metastatic lymphadenopathy in the right neck and supraclavicular region. 3. Hypermetabolic lymphadenopathy in the hepato duodenal ligament consistent with metastatic disease. 4. Large hypermetabolic lesion posterior right acetabulum without correlate of CT finding. Features highly suspicious for bony metastatic disease. MRI brain 10/28/2018  1. No metastatic disease or acute intracranial abnormality identified. 2. 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  3. Moderately advanced but nonspecific cerebral white matter signal changes, most commonly due to chronic small vessel disease. Lesser signal changes in the deep gray matter and pons may also be small vessel related.  CT chest abdomen pelvis with contrast 12/15/2018 1 Marked response to therapy of thoracic adenopathy. 2. No new or progressive disease in the chest. 3. Coronary artery atherosclerosis. 4. 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 5. No new or progressive disease in the abdomen or pelvis 6. Pelvic floor laxity, cystocele with bladder wall thickening, suggesting a component of outlet obstruction. 7. Possible vague sclerosis within the posterior right acetabulum, in the region of hypermetabolism on prior PET. Likely an area of treated metastasis. 8. Central line tip in right brachiocephalic vein with surrounding occlusive thrombus. CT chest abdomen pelvis with contrast 01/24/2019 1. New extensive gas throughout the bladder wall and within the bladder lumen with underlying mild diffuse bladder wall thickening, compatible with acute emphysematous cystitis. 2. Two new subcentimeter low-attenuation lesions in the right kidney, nonspecific but worrisome for mild acute right pyelonephritis given the above bladder findings. No discrete/drainable renal abscess. No hydronephrosis. 3. Continued positive response to therapy with continued reduction in mediastinal adenopathy. No residual measurable left lung mass.Stable faint sclerosis at the site of the hypermetabolic right acetabular osseous lesion. No new or progressive metastatic disease in the chest, abdomen or pelvis. 4. Small bowel containing small periumbilical hernia without bowel complication at this time. 5. Aortic Atherosclerosis (ICD10-I70.0) and Emphysema (ICD10-J43.9).  05/05/2019 CT chest abdomen  pelvis with contrast Near complete resolution of prior mediastinal lymphadenopathy.  Residual 12 mm short axis subcarinal node. Radiation changes in the lungs bilaterally.  Left lower lobe/lingular scarring.  No suspicious pulmonary nodules.  No  evidence of metastatic disease in the abdomen or pelvis.   ASSESSMENT & PLAN:  1. Small cell lung cancer ([place])   2. Encounter for antineoplastic immunotherapy   3. Other fatigue   4. Memory loss   5. Depression    #Extensive small cell lung cancer S/p 4 cycles of Carboplatin, Etoposide and Tecentriq.   Status post chest and whole brain radiation.  Labs reviewed and discussed with patient.  Counts are stable.  Proceed with Tecentriq. Repeat chest abdomen pelvis  in early November 2020 .  Chronic fatigue, TSH has been followed.  Normal cortisol level  #Forgetfulness, likely secondary to whole brain radiation.  Continue to monitor.  Depression has been well controlled.  Continue Zoloft... Follow-up with palliative care. Orders Placed This Encounter  Procedures  . MR Brain W Wo Contrast    Standing Status:   Future    Standing Expiration Date:   10/04/2020    Order Specific Question:   ** REASON FOR EXAM (FREE TEXT)    Answer:   small cell lung cancer follow up    Order Specific Question:   If indicated for the ordered procedure, I authorize the administration of contrast media per Radiology protocol    Answer:   Yes    Order Specific Question:   What is the patient's sedation requirement?    Answer:   No Sedation    Order Specific Question:   Does the patient have a pacemaker or implanted devices?    Answer:   No    Order Specific Question:   Use SRS Protocol?    Answer:   No    Order Specific Question:   Radiology Contrast Protocol - do NOT remove file path    Answer:   [email or web address]    Order Specific Question:   Preferred imaging location?    Answer:   [facility] (table limit-400lbs)    Follow  up in 3 weeks for eval with next cycle of tecentriq.    Earlie Server, MD, PhD

## 2019-10-06 ENCOUNTER — Telehealth: Payer: Self-pay

## 2019-10-06 LAB — THYROID PANEL WITH TSH
Free Thyroxine Index: 1.7 (ref 1.2–4.9)
T3 Uptake Ratio: 31 % (ref 24–39)
T4, Total: 5.6 ug/dL (ref 4.5–12.0)
TSH: 1.5 u[IU]/mL (ref 0.450–4.500)

## 2019-10-06 NOTE — Telephone Encounter (Signed)
Omniseq request on specimen ID#  [id number] (collection date: 10/21/18) has been sent/faxed to [facility] pathology. Confirmation fax received.

## 2019-10-09 DIAGNOSIS — C3402 Malignant neoplasm of left main bronchus: Secondary | ICD-10-CM | POA: Diagnosis not present

## 2019-10-20 ENCOUNTER — Ambulatory Visit: Payer: Medicare Other

## 2019-10-23 ENCOUNTER — Ambulatory Visit
Admission: RE | Admit: 2019-10-23 | Discharge: 2019-10-23 | Disposition: A | Discharge status: Home / Self Care | Payer: Medicare Other | Source: Ambulatory Visit | Attending: Oncology | Admitting: Oncology

## 2019-10-23 ENCOUNTER — Other Ambulatory Visit: Payer: Self-pay

## 2019-10-23 DIAGNOSIS — C3490 Malignant neoplasm of unspecified part of unspecified bronchus or lung: Secondary | ICD-10-CM | POA: Diagnosis not present

## 2019-10-23 DIAGNOSIS — K439 Ventral hernia without obstruction or gangrene: Secondary | ICD-10-CM | POA: Diagnosis not present

## 2019-10-23 DIAGNOSIS — J439 Emphysema, unspecified: Secondary | ICD-10-CM | POA: Diagnosis not present

## 2019-10-23 MED ORDER — IOHEXOL 300 MG/ML  SOLN
100.0000 mL | Freq: Once | INTRAMUSCULAR | Status: AC | PRN
  Administered 2019-10-23: 100 mL via INTRAVENOUS

## 2019-10-24 ENCOUNTER — Encounter: Payer: Self-pay | Admitting: Oncology

## 2019-10-25 ENCOUNTER — Encounter: Payer: Self-pay | Admitting: Oncology

## 2019-10-25 ENCOUNTER — Other Ambulatory Visit: Payer: Self-pay

## 2019-10-25 NOTE — Progress Notes (Signed)
Patient prescreened for appointment. States she has been having sharp pain to her spine for about 4-5 days, she describes it as "a circle" to the back of her spine. The pain gets so bad at times that it makes it hard to breathe.

## 2019-10-26 ENCOUNTER — Other Ambulatory Visit: Payer: Self-pay

## 2019-10-26 ENCOUNTER — Encounter: Payer: Self-pay | Admitting: Oncology

## 2019-10-26 ENCOUNTER — Inpatient Hospital Stay (HOSPITAL_BASED_OUTPATIENT_CLINIC_OR_DEPARTMENT_OTHER): Payer: Medicare Other | Admitting: Oncology

## 2019-10-26 ENCOUNTER — Telehealth: Payer: Self-pay | Admitting: Internal Medicine

## 2019-10-26 ENCOUNTER — Inpatient Hospital Stay: Payer: Medicare Other

## 2019-10-26 ENCOUNTER — Inpatient Hospital Stay: Payer: Medicare Other | Attending: Oncology

## 2019-10-26 VITALS — BP 103/71 | HR 88 | Temp 97.6°F | Resp 18 | Wt 147.3 lb

## 2019-10-26 DIAGNOSIS — Z5112 Encounter for antineoplastic immunotherapy: Secondary | ICD-10-CM | POA: Diagnosis not present

## 2019-10-26 DIAGNOSIS — C3492 Malignant neoplasm of unspecified part of left bronchus or lung: Secondary | ICD-10-CM | POA: Diagnosis not present

## 2019-10-26 DIAGNOSIS — M8580 Other specified disorders of bone density and structure, unspecified site: Secondary | ICD-10-CM

## 2019-10-26 DIAGNOSIS — C3490 Malignant neoplasm of unspecified part of unspecified bronchus or lung: Secondary | ICD-10-CM | POA: Diagnosis not present

## 2019-10-26 DIAGNOSIS — Z95828 Presence of other vascular implants and grafts: Secondary | ICD-10-CM

## 2019-10-26 DIAGNOSIS — Z87891 Personal history of nicotine dependence: Secondary | ICD-10-CM | POA: Diagnosis not present

## 2019-10-26 DIAGNOSIS — R413 Other amnesia: Secondary | ICD-10-CM

## 2019-10-26 DIAGNOSIS — S22000A Wedge compression fracture of unspecified thoracic vertebra, initial encounter for closed fracture: Secondary | ICD-10-CM

## 2019-10-26 LAB — COMPREHENSIVE METABOLIC PANEL
ALT: 12 U/L (ref 0–44)
AST: 14 U/L — ABNORMAL LOW (ref 15–41)
Albumin: 4 g/dL (ref 3.5–5.0)
Alkaline Phosphatase: 87 U/L (ref 38–126)
Anion gap: 10 (ref 5–15)
BUN: 11 mg/dL (ref 8–23)
CO2: 22 mmol/L (ref 22–32)
Calcium: 9.3 mg/dL (ref 8.9–10.3)
Chloride: 101 mmol/L (ref 98–111)
Creatinine, Ser: 0.75 mg/dL (ref 0.44–1.00)
GFR calc Af Amer: >60 mL/min (ref >60)
GFR calc non Af Amer: >60 mL/min (ref >60)
Glucose, Bld: 113 mg/dL — ABNORMAL HIGH (ref 70–99)
Potassium: 3.9 mmol/L (ref 3.5–5.1)
Sodium: 133 mmol/L — ABNORMAL LOW (ref 135–145)
Total Bilirubin: 0.9 mg/dL (ref 0.3–1.2)
Total Protein: 7.2 g/dL (ref 6.5–8.1)

## 2019-10-26 LAB — CBC WITH DIFFERENTIAL/PLATELET
Abs Immature Granulocytes: 0.03 10*3/uL (ref 0.00–0.07)
Basophils Absolute: 0 10*3/uL (ref 0.0–0.1)
Basophils Relative: 0 %
Eosinophils Absolute: 0.1 10*3/uL (ref 0.0–0.5)
Eosinophils Relative: 2 %
HCT: 35.8 % — ABNORMAL LOW (ref 36.0–46.0)
Hemoglobin: 11.9 g/dL — ABNORMAL LOW (ref 12.0–15.0)
Immature Granulocytes: 0 %
Lymphocytes Relative: 13 %
Lymphs Abs: 0.9 10*3/uL (ref 0.7–4.0)
MCH: 29.4 pg (ref 26.0–34.0)
MCHC: 33.2 g/dL (ref 30.0–36.0)
MCV: 88.4 fL (ref 80.0–100.0)
Monocytes Absolute: 0.9 10*3/uL (ref 0.1–1.0)
Monocytes Relative: 13 %
Neutro Abs: 4.9 10*3/uL (ref 1.7–7.7)
Neutrophils Relative %: 72 %
Platelets: 162 10*3/uL (ref 150–400)
RBC: 4.05 MIL/uL (ref 3.87–5.11)
RDW: 13.5 % (ref 11.5–15.5)
WBC: 6.8 10*3/uL (ref 4.0–10.5)
nRBC: 0 % (ref 0.0–0.2)

## 2019-10-26 MED ORDER — SODIUM CHLORIDE 0.9% FLUSH
10.0000 mL | Freq: Once | INTRAVENOUS | Status: AC
  Administered 2019-10-26: 09:00:00 10 mL via INTRAVENOUS
  Filled 2019-10-26: qty 10

## 2019-10-26 MED ORDER — DEXAMETHASONE SODIUM PHOSPHATE 10 MG/ML IJ SOLN
10.0000 mg | Freq: Once | INTRAMUSCULAR | Status: AC
  Administered 2019-10-26: 10 mg via INTRAVENOUS
  Filled 2019-10-26: qty 1

## 2019-10-26 MED ORDER — SODIUM CHLORIDE 0.9 % IV SOLN
Freq: Once | INTRAVENOUS | Status: AC
  Administered 2019-10-26: 11:00:00 via INTRAVENOUS
  Filled 2019-10-26: qty 250

## 2019-10-26 MED ORDER — SODIUM CHLORIDE 0.9 % IV SOLN
1200.0000 mg | Freq: Once | INTRAVENOUS | Status: AC
  Administered 2019-10-26: 1200 mg via INTRAVENOUS
  Filled 2019-10-26: qty 20

## 2019-10-26 MED ORDER — HEPARIN SOD (PORK) LOCK FLUSH 100 UNIT/ML IV SOLN
500.0000 [IU] | Freq: Once | INTRAVENOUS | Status: AC | PRN
  Administered 2019-10-26: 12:00:00 500 [IU]
  Filled 2019-10-26: qty 5

## 2019-10-26 NOTE — Progress Notes (Signed)
Hematology/Oncology Follow up note [facility] [phone] [phone]   Patient Care Team: Glean Hess, MD as PCP - General (Internal Medicine) Charolette Forward, MD as Consulting Physician (Cardiology) Telford Nab, RN as Registered Nurse  REFERRING PROVIDER: Dr. Felicie Morn REASON FOR VISIT:  Follow-up for small cell lung cancer,   HISTORY OF PRESENTING ILLNESS:  Kristi [name] is a  73 y.o.  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  10/20/2018 CT chest with contrast showed large mediastinal mass involving both hilar, left greater than right, consistent with lung carcinoma, The mass causes narrowing of the left mainstem bronchus with resultant volume loss on the left and a mediastinal shift to the left.  Moderate size left pleural effusion. 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  # MRI brain negative.  # Nov 2019- Jan 2020 s/p 4 cycles of Carbo/Etoposide/Tecentriq #  01/24/2019 interim CT scan done which showed continued positive response to therapy with continued reduction in mediastinal adenopathy.  No residual measurable left lung mass. CT findings of acute emphysematous cystitis. Urology did not feel that patient has pyelonephritis and recommend treatment with antibiotics because patient's immunocompromise. Patient finished treatment.   # Chest and whole brain radiation finished in May 2020 # 04/25/2019 resume Tecentriq every 3 weeks.  INTERVAL HISTORY Kristi [name] is a 73 y.o. female who has above history reviewed by me today presents for evaluation prior to immunotherapy for treatment of extensive small cell lung cancer. Currently patient is on immunotherapy maintenance. Patient reports worsening of upper back pain, 7 out of 10, exacerbated with deep breaths. .  She started to take Norco 5  mg 1-2 times a day, able to decrease pain to 3-4 out of 10.  Denies any dizziness, nausea, vomiting, fever, dysuria, diarrhea, chest pain, abdominal pain or shortness of breath. Occasional cough, chronic. Chronic fatigue at baseline.       Review of Systems  Constitutional: Positive for fatigue. Negative for appetite change, chills and fever.  HENT:   Negative for hearing loss and voice change.   Eyes: Negative for eye problems.  Respiratory: Negative for chest tightness, cough and shortness of breath.   Cardiovascular: Negative for chest pain.  Gastrointestinal: Negative for abdominal distention, abdominal pain, blood in stool, diarrhea and nausea.  Endocrine: Negative for hot flashes.  Genitourinary: Negative for difficulty urinating and frequency.   Musculoskeletal: Positive for back pain. Negative for arthralgias.  Skin: Negative for itching and rash.  Neurological: Negative for extremity weakness.  Hematological: Negative for adenopathy.  Psychiatric/Behavioral: Negative for confusion and depression. The patient is not nervous/anxious.        Forgetful     MEDICAL HISTORY:  Past Medical History:  Diagnosis Date   Acute MI, inferoposterior wall ([place]) 09/30/2014   Claustrophobia    Coronary artery disease    Hypercholesteremia    Hypertension    MI, old    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 ([place]) 10/27/2018    SURGICAL HISTORY: Past Surgical History:  Procedure Laterality Date   APPENDECTOMY     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   BLADDER NECK SUSPENSION     CHOLECYSTECTOMY N/A 08/14/2018   Procedure: LAPAROSCOPIC CHOLECYSTECTOMY;  Surgeon: Jules Husbands, MD;  Location: [facility] ORS;  Service: General;  Laterality: N/A;   CHOLECYSTECTOMY  11/2018   CORONARY STENT PLACEMENT  ENDOBRONCHIAL ULTRASOUND N/A 10/21/2018   Procedure: ENDOBRONCHIAL ULTRASOUND;  Surgeon: Laverle Hobby, MD;  Location: [facility] ORS;  Service: Pulmonary;  Laterality: N/A;   HERNIA REPAIR      IR FLUORO GUIDE CV LINE RIGHT  12/19/2018   LEFT HEART CATHETERIZATION WITH CORONARY ANGIOGRAM N/A 09/29/2014   Procedure: LEFT HEART CATHETERIZATION WITH CORONARY ANGIOGRAM;  Surgeon: Clent Demark, MD;  Location: [place] CATH [facility];  Service: Cardiovascular;  Laterality: N/A;   PORTA CATH INSERTION N/A 01/02/2019   Procedure: PORTA CATH INSERTION;  Surgeon: Algernon Huxley, MD;  Location: [place] CV LAB;  Service: Cardiovascular;  Laterality: N/A;   PORTACATH PLACEMENT Right 10/28/2018   Procedure: INSERTION PORT-A-CATH;  Surgeon: Jules Husbands, MD;  Location: [facility] ORS;  Service: General;  Laterality: Right;    SOCIAL HISTORY: Social History   Socioeconomic History   Marital status: Married    Spouse name: [name]    Number of children: 3   Years of education: Not on file   Highest education level: Not on file  Occupational History   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 strain: Not very hard   Food insecurity    Worry: Not on file    Inability: Not on file   Transportation needs    Medical: No    Non-medical: No  Tobacco Use   Smoking status: Former Smoker    Packs/day: 1.00    Years: 39.00    Pack years: 39.00    Types: Cigarettes    Start date: 12/21/1978    Quit date: 10/16/2018    Years since quitting: 1.0   Smokeless tobacco: Never Used  Substance and Sexual Activity   Alcohol use: Yes    Alcohol/week: 0.0 standard drinks    Comment: occassional - approx 1 every 2 weeks    Drug use: No   Sexual activity: Yes    Birth control/protection: None  Lifestyle   Physical activity    Days per week: 0 days    Minutes per session: Not on file   Stress: To some extent  Relationships   Social connections    Talks on phone: Three times a week    Gets together: Once a week    Attends religious service: 1 to 4 times per year    Active member of club or organization: Not on file    Attends meetings of clubs  or organizations: Not on file    Relationship status: Married   Intimate partner violence    Fear of current or ex partner: No    Emotionally abused: No    Physically abused: No    Forced sexual activity: No  Other Topics Concern   Not on file  Social History Narrative   Not on file    FAMILY HISTORY: Family History  Problem Relation Age of Onset   Alzheimer's disease Mother    Colon cancer Father    Breast cancer Neg Hx     ALLERGIES:  has No Known Allergies.  MEDICATIONS:  Current Outpatient Medications  Medication Sig Dispense Refill   atorvastatin (LIPITOR) 80 MG tablet Take 0.5 tablets (40 mg total) by mouth daily at 6 PM. 30 tablet 3   B Complex Vitamins (VITAMIN B COMPLEX PO) Take 1 tablet by mouth daily.      CALCIUM PO Take 1 tablet by mouth daily.      Cholecalciferol (VITAMIN D) 2000 units CAPS Take 1  capsule (2,000 Units total) by mouth daily. 30 capsule    dexamethasone (DECADRON) 4 MG tablet Take 1 tablet (4 mg total) by mouth daily. 25 tablet 0   diphenhydrAMINE-zinc acetate (BENADRYL EXTRA STRENGTH) cream Apply 1 application topically 3 (three) times daily as needed for itching. 28.4 g 0   HYDROcodone-acetaminophen (NORCO/VICODIN) 5-325 MG tablet Take 1 tablet by mouth daily as needed for moderate pain. 30 tablet 0   lidocaine-prilocaine (EMLA) cream Apply to affected area once 30 g 3   nitroGLYCERIN (NITROSTAT) 0.4 MG SL tablet Place 1 tablet (0.4 mg total) under the tongue every 5 (five) minutes x 3 doses as needed for chest pain. 25 tablet 12   omeprazole (PRILOSEC) 40 MG capsule TAKE ONE CAPSULE BY MOUTH DAILY 90 capsule 1   potassium chloride SA (KLOR-CON) 20 MEQ tablet      metoprolol tartrate (LOPRESSOR) 25 MG tablet Take 12.5 mg by mouth 2 (two) times daily.     ondansetron (ZOFRAN) 8 MG tablet Take 1 tablet (8 mg total) by mouth 2 (two) times daily as needed for refractory nausea / vomiting. Start on day 3 after carboplatin chemo.  (Patient not taking: Reported on 10/25/2019) 30 tablet 1   polyethylene glycol (MIRALAX / GLYCOLAX) packet Take 17 g by mouth daily as needed.      prochlorperazine (COMPAZINE) 10 MG tablet TAKE 1 TABLET BY MOUTH EVERY 6 HOURS AS NEEDED FOR NAUSEA OR VOMITING (Patient not taking: Reported on 10/25/2019) 270 tablet 1   sertraline (ZOLOFT) 50 MG tablet Take 1.5 tablets (75 mg total) by mouth daily. (Patient not taking: Reported on 10/25/2019) 45 tablet 2   triamterene-hydrochlorothiazide (DYAZIDE) 37.5-25 MG capsule Take 1 each (1 capsule total) by mouth daily as needed. (Patient not taking: Reported on 10/25/2019) 90 capsule 0   No current facility-administered medications for this visit.    Facility-Administered Medications Ordered in Other Visits  Medication Dose Route Frequency Provider Last Rate Last Dose   heparin lock flush 100 unit/mL  500 Units Intravenous Once Earlie Server, MD       heparin lock flush 100 unit/mL  500 Units Intravenous Once Earlie Server, MD       sodium chloride flush (NS) 0.9 % injection 10 mL  10 mL Intravenous PRN Earlie Server, MD   10 mL at 01/09/19 0820   sodium chloride flush (NS) 0.9 % injection 10 mL  10 mL Intravenous PRN Earlie Server, MD   10 mL at 01/10/19 1400     PHYSICAL EXAMINATION: ECOG PERFORMANCE STATUS: 1 - Symptomatic but completely ambulatory Vitals:   10/26/19 0938  BP: 103/71  Pulse: 88  Resp: 18  Temp: 97.6 F (36.4 C)  SpO2: 95%   Filed Weights   10/26/19 0938  Weight: 147 lb 4.8 oz (66.8 kg)   Physical Exam  Constitutional: She is oriented to person, place, and time. No distress.  HENT:  Head: Normocephalic and atraumatic.  Nose: Nose normal.  Mouth/Throat: Oropharynx is clear and moist. No oropharyngeal exudate.  Eyes: Pupils are equal, round, and reactive to light. EOM are normal. No scleral icterus.  Neck: Normal range of motion. Neck supple.  Cardiovascular: Normal rate and regular rhythm.  No murmur heard. Pulmonary/Chest: Effort  normal. No respiratory distress. She has no rales. She exhibits no tenderness.  Decreased breath sounds bilaterally, right side breath sound is more diminished.   Abdominal: Soft. She exhibits no distension. There is no abdominal tenderness.  Musculoskeletal: Normal range of motion.  General: No edema.  Neurological: She is alert and oriented to person, place, and time. No cranial nerve deficit. She exhibits normal muscle tone. Coordination normal.  Skin: Skin is warm and dry. She is not diaphoretic. No erythema.  Psychiatric: Affect normal.       LABORATORY DATA:  I have reviewed the data as listed Lab Results  Component Value Date   WBC 6.8 10/26/2019   HGB 11.9 (L) 10/26/2019   HCT 35.8 (L) 10/26/2019   MCV 88.4 10/26/2019   PLT 162 10/26/2019   Recent Labs    09/14/19 0846 10/05/19 0900 10/26/19 0908  NA 136 135 133*  K 4.1 3.8 3.9  CL 103 104 101  CO2 23 25 22   GLUCOSE 103* 101* 113*  BUN 17 15 11   CREATININE 0.78 0.66 0.75  CALCIUM 9.2 9.3 9.3  GFRNONAA >60 >60 >60  GFRAA >60 >60 >60  PROT 6.8 7.0 7.2  ALBUMIN 3.9 3.9 4.0  AST 18 13* 14*  ALT 14 12 12   ALKPHOS 65 75 87  BILITOT 0.5 0.8 0.9    RADIOGRAPHIC STUDIES: I have personally reviewed the radiological images as listed and agreed with the findings in the report. 10/20/2018 CT chest w contrast . Large mediastinal mass involving both hila left much greater than right consistent with lung carcinoma possibly small cell lung carcinoma. This mass causes narrowing of the left mainstem bronchus with resultant volume loss on the left and mediastinal shift to the Left. 2. Moderate size left pleural effusion. 3. Coronary artery calcifications and moderate thoracic aortic atherosclerosis. PET 10/26/2018 . Large left lung mass is markedly hypermetabolic in the confluent lymphadenopathy in the mediastinum and in bilateral hilar regions also shows marked hypermetabolism. 2. Hypermetabolic metastatic lymphadenopathy  in the right neck and supraclavicular region. 3. Hypermetabolic lymphadenopathy in the hepato duodenal ligament consistent with metastatic disease. 4. Large hypermetabolic lesion posterior right acetabulum without correlate of CT finding. Features highly suspicious for bony metastatic disease. MRI brain 10/28/2018 1. No metastatic disease or acute intracranial abnormality identified. 2. 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  3. Moderately advanced but nonspecific cerebral white matter signal changes, most commonly due to chronic small vessel disease. Lesser signal changes in the deep gray matter and pons may also be small vessel related.  CT chest abdomen pelvis with contrast 12/15/2018 1 Marked response to therapy of thoracic adenopathy. 2. No new or progressive disease in the chest. 3. Coronary artery atherosclerosis. 4. 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 5. No new or progressive disease in the abdomen or pelvis 6. Pelvic floor laxity, cystocele with bladder wall thickening, suggesting a component of outlet obstruction. 7. Possible vague sclerosis within the posterior right acetabulum, in the region of hypermetabolism on prior PET. Likely an area of treated metastasis. 8. Central line tip in right brachiocephalic vein with surrounding occlusive thrombus. CT chest abdomen pelvis with contrast 01/24/2019 1. New extensive gas throughout the bladder wall and within the bladder lumen with underlying mild diffuse bladder wall thickening, compatible with acute emphysematous cystitis. 2. Two new subcentimeter low-attenuation lesions in the right kidney, nonspecific but worrisome for mild acute right pyelonephritis given the above bladder findings. No discrete/drainable renal abscess. No hydronephrosis. 3. Continued positive response to therapy with continued reduction in mediastinal adenopathy. No residual measurable left lung mass.Stable faint sclerosis at the site of the  hypermetabolic right acetabular osseous lesion. No new or progressive metastatic disease in the chest, abdomen or pelvis. 4. Small bowel  containing small periumbilical hernia without bowel complication at this time. 5. Aortic Atherosclerosis (ICD10-I70.0) and Emphysema (ICD10-J43.9).  05/05/2019 CT chest abdomen pelvis with contrast Near complete resolution of prior mediastinal lymphadenopathy.  Residual 12 mm short axis subcarinal node. Radiation changes in the lungs bilaterally.  Left lower lobe/lingular scarring.  No suspicious pulmonary nodules.  No evidence of metastatic disease in the abdomen or pelvis.  Ct Chest W Contrast  Result Date: 10/23/2019 CLINICAL DATA:  Extensive stage small cell left lung cancer diagnosed October 2019 status post chemotherapy and chest and brain radiation therapy. Ongoing chemotherapy. Restaging. Patient reports productive cough and right upper quadrant pain. EXAM: CT CHEST, ABDOMEN, AND PELVIS WITH CONTRAST TECHNIQUE: Multidetector CT imaging of the chest, abdomen and pelvis was performed following the standard protocol during bolus administration of intravenous contrast. CONTRAST:  151mL OMNIPAQUE IOHEXOL 300 MG/ML  SOLN COMPARISON:  07/27/2019 CT chest, abdomen and pelvis. FINDINGS: CT CHEST FINDINGS Cardiovascular: Normal heart size. No significant pericardial effusion/thickening. Three-vessel coronary atherosclerosis. Right internal jugular Port-A-Cath terminates at the cavoatrial junction. Atherosclerotic nonaneurysmal thoracic aorta. Normal caliber pulmonary arteries. No central pulmonary emboli. Mediastinum/Nodes: No discrete thyroid nodules. Unremarkable esophagus. No pathologically enlarged axillary, mediastinal or hilar lymph nodes. Lungs/Pleura: No pneumothorax. No pleural effusion. Mild centrilobular and paraseptal emphysema. No acute consolidative airspace disease, lung masses or significant pulmonary nodules. Stable calcified subcentimeter right pulmonary  granulomas. Musculoskeletal: No aggressive appearing focal osseous lesions. New mild superior T7 vertebral compression deformity. Mild thoracic spondylosis. CT ABDOMEN PELVIS FINDINGS Hepatobiliary: Normal liver with no liver mass. Cholecystectomy. No biliary ductal dilatation. Pancreas: Normal, with no mass or duct dilation. Spleen: Normal size. No mass. Adrenals/Urinary Tract: Normal adrenals. Normal kidneys with no hydronephrosis and no renal mass. Small cystocele. Otherwise normal bladder. Stomach/Bowel: Normal non-distended stomach. Stable small to moderate paraumbilical hernia containing multiple small bowel loops. No small bowel dilatation, focal caliber transition, wall thickening or pneumatosis. Oral contrast transits to the cecum. Appendectomy. Mild diffuse colonic diverticulosis, with no large bowel wall thickening or acute pericolonic fat stranding. Vascular/Lymphatic: Atherosclerotic nonaneurysmal abdominal aorta. Patent portal, splenic, hepatic and renal veins. No pathologically enlarged lymph nodes in the abdomen or pelvis. Reproductive: Grossly normal uterus.  No adnexal mass. Other: No pneumoperitoneum, ascites or focal fluid collection. Musculoskeletal: No aggressive appearing focal osseous lesions. The hypermetabolic right acetabular lesion on 10/26/2018 PET-CT study is not evident on today's CT scan. Mild lumbar spondylosis. IMPRESSION: 1. No findings of metastatic disease in the chest, abdomen or pelvis. No evidence of recurrent pulmonary neoplasm. 2. New mild superior T7 vertebral compression fracture without evidence of underlying pathologic osseous lesion. 3. Stable small to moderate paraumbilical ventral abdominal hernia containing small bowel loops, without acute bowel complication. 4. Aortic Atherosclerosis (ICD10-I70.0) and Emphysema (ICD10-J43.9). Electronically Signed   By: Ilona Sorrel M.D.   On: 10/23/2019 10:29   Ct Abdomen Pelvis W Contrast  Result Date: 10/23/2019 CLINICAL  DATA:  Extensive stage small cell left lung cancer diagnosed October 2019 status post chemotherapy and chest and brain radiation therapy. Ongoing chemotherapy. Restaging. Patient reports productive cough and right upper quadrant pain. EXAM: CT CHEST, ABDOMEN, AND PELVIS WITH CONTRAST TECHNIQUE: Multidetector CT imaging of the chest, abdomen and pelvis was performed following the standard protocol during bolus administration of intravenous contrast. CONTRAST:  140mL OMNIPAQUE IOHEXOL 300 MG/ML  SOLN COMPARISON:  07/27/2019 CT chest, abdomen and pelvis. FINDINGS: CT CHEST FINDINGS Cardiovascular: Normal heart size. No significant pericardial effusion/thickening. Three-vessel coronary atherosclerosis. Right internal jugular Port-A-Cath terminates at  the cavoatrial junction. Atherosclerotic nonaneurysmal thoracic aorta. Normal caliber pulmonary arteries. No central pulmonary emboli. Mediastinum/Nodes: No discrete thyroid nodules. Unremarkable esophagus. No pathologically enlarged axillary, mediastinal or hilar lymph nodes. Lungs/Pleura: No pneumothorax. No pleural effusion. Mild centrilobular and paraseptal emphysema. No acute consolidative airspace disease, lung masses or significant pulmonary nodules. Stable calcified subcentimeter right pulmonary granulomas. Musculoskeletal: No aggressive appearing focal osseous lesions. New mild superior T7 vertebral compression deformity. Mild thoracic spondylosis. CT ABDOMEN PELVIS FINDINGS Hepatobiliary: Normal liver with no liver mass. Cholecystectomy. No biliary ductal dilatation. Pancreas: Normal, with no mass or duct dilation. Spleen: Normal size. No mass. Adrenals/Urinary Tract: Normal adrenals. Normal kidneys with no hydronephrosis and no renal mass. Small cystocele. Otherwise normal bladder. Stomach/Bowel: Normal non-distended stomach. Stable small to moderate paraumbilical hernia containing multiple small bowel loops. No small bowel dilatation, focal caliber transition,  wall thickening or pneumatosis. Oral contrast transits to the cecum. Appendectomy. Mild diffuse colonic diverticulosis, with no large bowel wall thickening or acute pericolonic fat stranding. Vascular/Lymphatic: Atherosclerotic nonaneurysmal abdominal aorta. Patent portal, splenic, hepatic and renal veins. No pathologically enlarged lymph nodes in the abdomen or pelvis. Reproductive: Grossly normal uterus.  No adnexal mass. Other: No pneumoperitoneum, ascites or focal fluid collection. Musculoskeletal: No aggressive appearing focal osseous lesions. The hypermetabolic right acetabular lesion on 10/26/2018 PET-CT study is not evident on today's CT scan. Mild lumbar spondylosis. IMPRESSION: 1. No findings of metastatic disease in the chest, abdomen or pelvis. No evidence of recurrent pulmonary neoplasm. 2. New mild superior T7 vertebral compression fracture without evidence of underlying pathologic osseous lesion. 3. Stable small to moderate paraumbilical ventral abdominal hernia containing small bowel loops, without acute bowel complication. 4. Aortic Atherosclerosis (ICD10-I70.0) and Emphysema (ICD10-J43.9). Electronically Signed   By: Ilona Sorrel M.D.   On: 10/23/2019 10:29     ASSESSMENT & PLAN:  1. Small cell lung cancer ([place])   2. Encounter for antineoplastic immunotherapy   3. Memory loss   4. Thoracic compression fracture, closed, initial encounter ([place])   5. Osteopenia, unspecified location    #Extensive small cell lung cancer S/p 4 cycles of Carboplatin, Etoposide and Tecentriq.   Status post chest and whole brain radiation.  Labs are reviewed and discussed with patient. Counts acceptable to proceed with Tecentriq maintenance. Interval CT chest abdomen pelvis images were independently reviewed by me and discussed with patient. No findings of metastatic disease/recurrence disease.  #T7 vertebral compression fracture without evidence of underlying pathology osseous lesion. Continue pain  control with Norco 5 mg every 6 hours as needed.  #Osteopenia, recommend patient to continue calcium and vitamin D supplementation.  Check vitamin D level at the next visit. I asked patient to bring her vitamin D and calcium values to next clinic.  She could not remember the dose she has been taking.  #Previous FDG avid acetabulum area, possible bone mets.   Discussed the potential side effects of bisphosphonate including but not limited to- hypocalcemia and ONJ; discussed not to have invasive dental procedures few weeks around the time of the infusions.   She does not have any teeth, wears dentures We will proceed with Zometa 4 mg monthly  #Forgetfulness, likely secondary to whole brain radiation.  Continue to monitor.  Discussed about coping skills.  Depression has been well controlled.  Continue Zoloft... Follow-up with palliative care. Follow up in 3 weeks for eval with next cycle of tecentriq.    Earlie Server, MD, PhD

## 2019-10-26 NOTE — Telephone Encounter (Signed)
Called to schedule Medicare Annual Wellness Visit with Nurse Health Advisor, Clemetine Marker at [facility]. If patient returns call, please schedule AWV with [facility] ~ Any date on [facility] schedule (Mon or Wed)  Questions regarding scheduling, please call  [phone] or Skype > [email or web address]   [place]  ??[email or web address]   ??[phone]   1Skype

## 2019-10-26 NOTE — Progress Notes (Signed)
Patient pre-assessed for MD visit.

## 2019-10-30 ENCOUNTER — Other Ambulatory Visit: Payer: Self-pay

## 2019-10-30 ENCOUNTER — Other Ambulatory Visit: Payer: Self-pay | Admitting: Oncology

## 2019-10-30 ENCOUNTER — Telehealth: Payer: Self-pay | Admitting: *Deleted

## 2019-10-30 DIAGNOSIS — F329 Major depressive disorder, single episode, unspecified: Secondary | ICD-10-CM

## 2019-10-30 DIAGNOSIS — F32A Depression, unspecified: Secondary | ICD-10-CM

## 2019-10-30 MED ORDER — FENTANYL 25 MCG/HR TD PT72: 1.0000 | MEDICATED_PATCH | TRANSDERMAL | 0 refills | Status: DC

## 2019-10-30 MED ORDER — SERTRALINE HCL 50 MG PO TABS: 75.0000 mg | ORAL_TABLET | Freq: Every day | ORAL | 1 refills | Status: DC

## 2019-10-30 MED ORDER — PREDNISONE 20 MG PO TABS: 20.0000 mg | ORAL_TABLET | Freq: Every day | ORAL | 0 refills | Status: DC

## 2019-10-30 NOTE — Telephone Encounter (Signed)
Patient called requesting to speak with Dr Tasia Catchings about pain she is having. Please return her call [phone]

## 2019-10-30 NOTE — Telephone Encounter (Signed)
Dr. Tasia Catchings advises patient to D/C the Oxycodone and ake the Hydrocodone she has at home.  Dr. Tasia Catchings has sent a new prescription for Fentanyl patch along with steroid to her pharmacy.  Patient was requesting a refill on Zoloft.  Last MD note states to continue the Zoloft so a refill has been sent.  Patient confirmed and repeated directions to STOP Oxycodone, take Hydrocodone, pick up the prescriptions from the pharmacy and start today.  I recommended the patient to call the office if her pain seems to persistently get worse after she starts new med.

## 2019-10-30 NOTE — Telephone Encounter (Signed)
Called and talked to patient.  She is having more episodes of the severe mid back pain 10/10.  She did try the Oxycodone 10 mg that she had at home, did not take the Hydrocodone.  Was taking the Oxycodone 10 mg Q4H this weekend and it did not help with the pain.  Did not want to go to ER this weekend but the pain was so bad that she stated she should have gone.    Also wanted to let Dr. Tasia Catchings know that she is taking Vitamin D3 2,000 IU but is not taking a calcium supplement ( I will update med list)

## 2019-11-01 ENCOUNTER — Other Ambulatory Visit: Payer: Self-pay

## 2019-11-01 ENCOUNTER — Inpatient Hospital Stay: Payer: Medicare Other

## 2019-11-01 VITALS — BP 115/79 | HR 78 | Temp 97.8°F | Resp 18

## 2019-11-01 DIAGNOSIS — C3492 Malignant neoplasm of unspecified part of left bronchus or lung: Secondary | ICD-10-CM | POA: Diagnosis not present

## 2019-11-01 DIAGNOSIS — Z95828 Presence of other vascular implants and grafts: Secondary | ICD-10-CM

## 2019-11-01 DIAGNOSIS — E860 Dehydration: Secondary | ICD-10-CM

## 2019-11-01 DIAGNOSIS — Z5112 Encounter for antineoplastic immunotherapy: Secondary | ICD-10-CM | POA: Diagnosis not present

## 2019-11-01 DIAGNOSIS — M8580 Other specified disorders of bone density and structure, unspecified site: Secondary | ICD-10-CM | POA: Diagnosis not present

## 2019-11-01 DIAGNOSIS — Z87891 Personal history of nicotine dependence: Secondary | ICD-10-CM | POA: Diagnosis not present

## 2019-11-01 MED ORDER — SODIUM CHLORIDE 0.9 % IV SOLN
Freq: Once | INTRAVENOUS | Status: AC
  Administered 2019-11-01: 10:00:00 via INTRAVENOUS
  Filled 2019-11-01: qty 250

## 2019-11-01 MED ORDER — ZOLEDRONIC ACID 4 MG/5ML IV CONC
3.5000 mg | Freq: Once | INTRAVENOUS | Status: AC
  Administered 2019-11-01: 11:00:00 3.5 mg via INTRAVENOUS
  Filled 2019-11-01: qty 4.38

## 2019-11-01 MED ORDER — HEPARIN SOD (PORK) LOCK FLUSH 100 UNIT/ML IV SOLN
500.0000 [IU] | Freq: Once | INTRAVENOUS | Status: AC
  Administered 2019-11-01: 500 [IU] via INTRAVENOUS
  Filled 2019-11-01: qty 5

## 2019-11-01 NOTE — Telephone Encounter (Signed)
2nd attempt

## 2019-11-06 ENCOUNTER — Other Ambulatory Visit: Payer: Self-pay | Admitting: Oncology

## 2019-11-06 ENCOUNTER — Other Ambulatory Visit: Payer: Self-pay | Admitting: *Deleted

## 2019-11-06 MED ORDER — HYDROCODONE-ACETAMINOPHEN 5-325 MG PO TABS: 1.0000 | ORAL_TABLET | Freq: Every day | ORAL | 0 refills | Status: DC | PRN

## 2019-11-06 NOTE — Telephone Encounter (Signed)
Patient called requesting a refill on Hydrocodone.

## 2019-11-20 ENCOUNTER — Inpatient Hospital Stay: Payer: Medicare Other

## 2019-11-20 ENCOUNTER — Inpatient Hospital Stay (HOSPITAL_BASED_OUTPATIENT_CLINIC_OR_DEPARTMENT_OTHER): Payer: Medicare Other | Admitting: Oncology

## 2019-11-20 ENCOUNTER — Other Ambulatory Visit: Payer: Self-pay | Admitting: *Deleted

## 2019-11-20 ENCOUNTER — Other Ambulatory Visit: Payer: Self-pay

## 2019-11-20 ENCOUNTER — Encounter: Payer: Self-pay | Admitting: Oncology

## 2019-11-20 VITALS — BP 94/63 | HR 98 | Temp 98.6°F | Resp 18 | Wt 148.7 lb

## 2019-11-20 DIAGNOSIS — C3490 Malignant neoplasm of unspecified part of unspecified bronchus or lung: Secondary | ICD-10-CM

## 2019-11-20 DIAGNOSIS — M546 Pain in thoracic spine: Secondary | ICD-10-CM

## 2019-11-20 DIAGNOSIS — Z95828 Presence of other vascular implants and grafts: Secondary | ICD-10-CM

## 2019-11-20 DIAGNOSIS — Z87891 Personal history of nicotine dependence: Secondary | ICD-10-CM | POA: Diagnosis not present

## 2019-11-20 DIAGNOSIS — Z5112 Encounter for antineoplastic immunotherapy: Secondary | ICD-10-CM

## 2019-11-20 DIAGNOSIS — M8580 Other specified disorders of bone density and structure, unspecified site: Secondary | ICD-10-CM

## 2019-11-20 DIAGNOSIS — S22000A Wedge compression fracture of unspecified thoracic vertebra, initial encounter for closed fracture: Secondary | ICD-10-CM

## 2019-11-20 DIAGNOSIS — R413 Other amnesia: Secondary | ICD-10-CM

## 2019-11-20 DIAGNOSIS — C3492 Malignant neoplasm of unspecified part of left bronchus or lung: Secondary | ICD-10-CM | POA: Diagnosis not present

## 2019-11-20 LAB — CBC WITH DIFFERENTIAL/PLATELET
Abs Immature Granulocytes: 0.03 10*3/uL (ref 0.00–0.07)
Basophils Absolute: 0 10*3/uL (ref 0.0–0.1)
Basophils Relative: 1 %
Eosinophils Absolute: 0.2 10*3/uL (ref 0.0–0.5)
Eosinophils Relative: 4 %
HCT: 37.8 % (ref 36.0–46.0)
Hemoglobin: 12.4 g/dL (ref 12.0–15.0)
Immature Granulocytes: 1 %
Lymphocytes Relative: 19 %
Lymphs Abs: 1.1 10*3/uL (ref 0.7–4.0)
MCH: 29.5 pg (ref 26.0–34.0)
MCHC: 32.8 g/dL (ref 30.0–36.0)
MCV: 89.8 fL (ref 80.0–100.0)
Monocytes Absolute: 0.8 10*3/uL (ref 0.1–1.0)
Monocytes Relative: 15 %
Neutro Abs: 3.4 10*3/uL (ref 1.7–7.7)
Neutrophils Relative %: 60 %
Platelets: 188 10*3/uL (ref 150–400)
RBC: 4.21 MIL/uL (ref 3.87–5.11)
RDW: 13.5 % (ref 11.5–15.5)
WBC: 5.6 10*3/uL (ref 4.0–10.5)
nRBC: 0 % (ref 0.0–0.2)

## 2019-11-20 LAB — COMPREHENSIVE METABOLIC PANEL
ALT: 12 U/L (ref 0–44)
AST: 14 U/L — ABNORMAL LOW (ref 15–41)
Albumin: 4 g/dL (ref 3.5–5.0)
Alkaline Phosphatase: 88 U/L (ref 38–126)
Anion gap: 8 (ref 5–15)
BUN: 13 mg/dL (ref 8–23)
CO2: 22 mmol/L (ref 22–32)
Calcium: 8.8 mg/dL — ABNORMAL LOW (ref 8.9–10.3)
Chloride: 102 mmol/L (ref 98–111)
Creatinine, Ser: 0.65 mg/dL (ref 0.44–1.00)
GFR calc Af Amer: >60 mL/min (ref >60)
GFR calc non Af Amer: >60 mL/min (ref >60)
Glucose, Bld: 106 mg/dL — ABNORMAL HIGH (ref 70–99)
Potassium: 4 mmol/L (ref 3.5–5.1)
Sodium: 132 mmol/L — ABNORMAL LOW (ref 135–145)
Total Bilirubin: 0.7 mg/dL (ref 0.3–1.2)
Total Protein: 7.4 g/dL (ref 6.5–8.1)

## 2019-11-20 MED ORDER — FENTANYL 25 MCG/HR TD PT72: 1.0000 | MEDICATED_PATCH | TRANSDERMAL | 0 refills | Status: DC

## 2019-11-20 MED ORDER — SODIUM CHLORIDE 0.9% FLUSH
10.0000 mL | Freq: Once | INTRAVENOUS | Status: AC
  Administered 2019-11-20: 10 mL via INTRAVENOUS
  Filled 2019-11-20: qty 10

## 2019-11-20 MED ORDER — SODIUM CHLORIDE 0.9 % IV SOLN
Freq: Once | INTRAVENOUS | Status: AC
  Administered 2019-11-20: 10:00:00 via INTRAVENOUS
  Filled 2019-11-20: qty 250

## 2019-11-20 MED ORDER — HEPARIN SOD (PORK) LOCK FLUSH 100 UNIT/ML IV SOLN
500.0000 [IU] | Freq: Once | INTRAVENOUS | Status: AC | PRN
  Administered 2019-11-20: 500 [IU]
  Filled 2019-11-20: qty 5

## 2019-11-20 MED ORDER — SODIUM CHLORIDE 0.9 % IV SOLN
1200.0000 mg | Freq: Once | INTRAVENOUS | Status: AC
  Administered 2019-11-20: 1200 mg via INTRAVENOUS
  Filled 2019-11-20: qty 20

## 2019-11-20 MED ORDER — DEXAMETHASONE SODIUM PHOSPHATE 10 MG/ML IJ SOLN
10.0000 mg | Freq: Once | INTRAMUSCULAR | Status: AC
  Administered 2019-11-20: 10 mg via INTRAVENOUS
  Filled 2019-11-20: qty 1

## 2019-11-20 NOTE — Telephone Encounter (Signed)
...    Ref Range & Units 3wk ago 59mo ago 49mo ago  Potassium 3.5 - 5.1 mmol/L 3.9  3.8  4.1

## 2019-11-20 NOTE — Progress Notes (Signed)
Patient here for follow up. Pt complains of severe pain starting mid back and radiating to front. Pt states that fentanyl patch has been working, but want to know if she should continue. Pt requesting refill on fentanyl patch and hydrocodone.

## 2019-11-20 NOTE — Progress Notes (Signed)
Hematology/Oncology Follow up note [facility] [phone] [phone]   Patient Care Team: Glean Hess, MD as PCP - General (Internal Medicine) Charolette Forward, MD as Consulting Physician (Cardiology) Telford Nab, RN as Registered Nurse  REFERRING PROVIDER: Dr. Felicie Morn REASON FOR VISIT:  Follow-up for small cell lung cancer,   HISTORY OF PRESENTING ILLNESS:  Kristi [name] is a  73 y.o.  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  10/20/2018 CT chest with contrast showed large mediastinal mass involving both hilar, left greater than right, consistent with lung carcinoma, The mass causes narrowing of the left mainstem bronchus with resultant volume loss on the left and a mediastinal shift to the left.  Moderate size left pleural effusion. 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  # MRI brain negative.  # Nov 2019- Jan 2020 s/p 4 cycles of Carbo/Etoposide/Tecentriq #  01/24/2019 interim CT scan done which showed continued positive response to therapy with continued reduction in mediastinal adenopathy.  No residual measurable left lung mass. CT findings of acute emphysematous cystitis. Urology did not feel that patient has pyelonephritis and recommend treatment with antibiotics because patient's immunocompromise. Patient finished treatment.   # Chest and whole brain radiation finished in May 2020 # 04/25/2019 resume Tecentriq every 3 weeks.  INTERVAL HISTORY Kristi [name] is a 73 y.o. female who has above history reviewed by me today presents for evaluation prior to immunotherapy for treatment of extensive small cell lung cancer. Currently patient is on immunotherapy maintenance. She continues to have upper back pain, she radiate 6-7 out of 10.  She feels fentanyl patch has helped and she ran out of  it.  Did not call for refill. She is using Norco 5 mg 1-2 times a day.  Pain is decreased to 3-4 out of 10. Continue to be quite forgetful.  Denies any dizziness, nausea, vomiting, fever, dysuria, diarrhea, chest pain, abdominal pain or shortness of breath. Occasional cough, chronic. Chronic fatigue at baseline.  She has received Zometa on 11/01/2019.  Tolerates well.  Review of Systems  Constitutional: Positive for fatigue. Negative for appetite change, chills and fever.  HENT:   Negative for hearing loss and voice change.   Eyes: Negative for eye problems.  Respiratory: Negative for chest tightness, cough and shortness of breath.   Cardiovascular: Negative for chest pain.  Gastrointestinal: Negative for abdominal distention, abdominal pain, blood in stool, diarrhea and nausea.  Endocrine: Negative for hot flashes.  Genitourinary: Negative for difficulty urinating and frequency.   Musculoskeletal: Positive for back pain. Negative for arthralgias.  Skin: Negative for itching and rash.  Neurological: Negative for extremity weakness.  Hematological: Negative for adenopathy.  Psychiatric/Behavioral: Negative for confusion and depression. The patient is not nervous/anxious.        Forgetful     MEDICAL HISTORY:  Past Medical History:  Diagnosis Date   Acute MI, inferoposterior wall ([place]) 09/30/2014   Claustrophobia    Coronary artery disease    Hypercholesteremia    Hypertension    MI, old    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 ([place]) 10/27/2018    SURGICAL HISTORY: Past Surgical History:  Procedure Laterality Date   APPENDECTOMY     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   BLADDER NECK SUSPENSION     CHOLECYSTECTOMY N/A 08/14/2018   Procedure: LAPAROSCOPIC CHOLECYSTECTOMY;  Surgeon: Jules Husbands, MD;  Location: [facility] ORS;  Service: General;  Laterality: N/A;   CHOLECYSTECTOMY  11/2018   CORONARY STENT PLACEMENT     ENDOBRONCHIAL ULTRASOUND N/A 10/21/2018   Procedure:  ENDOBRONCHIAL ULTRASOUND;  Surgeon: Laverle Hobby, MD;  Location: [facility] ORS;  Service: Pulmonary;  Laterality: N/A;   HERNIA REPAIR     IR FLUORO GUIDE CV LINE RIGHT  12/19/2018   LEFT HEART CATHETERIZATION WITH CORONARY ANGIOGRAM N/A 09/29/2014   Procedure: LEFT HEART CATHETERIZATION WITH CORONARY ANGIOGRAM;  Surgeon: Clent Demark, MD;  Location: [facility] CATH [facility];  Service: Cardiovascular;  Laterality: N/A;   PORTA CATH INSERTION N/A 01/02/2019   Procedure: PORTA CATH INSERTION;  Surgeon: Algernon Huxley, MD;  Location: [place] [facility] CV [facility];  Service: Cardiovascular;  Laterality: N/A;   PORTACATH PLACEMENT Right 10/28/2018   Procedure: INSERTION PORT-A-CATH;  Surgeon: Jules Husbands, MD;  Location: [facility] ORS;  Service: General;  Laterality: Right;    SOCIAL HISTORY: Social History   Socioeconomic History   Marital status: Married    Spouse name: [name]    Number of children: 3   Years of education: Not on file   Highest education level: Not on file  Occupational History   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 strain: Not very hard   Food insecurity    Worry: Not on file    Inability: Not on file   Transportation needs    Medical: No    Non-medical: No  Tobacco Use   Smoking status: Former Smoker    Packs/day: 1.00    Years: 39.00    Pack years: 39.00    Types: Cigarettes    Start date: 12/21/1978    Quit date: 10/16/2018    Years since quitting: 1.0   Smokeless tobacco: Never Used  Substance and Sexual Activity   Alcohol use: Yes    Alcohol/week: 0.0 standard drinks    Comment: occassional - approx 1 every 2 weeks    Drug use: No   Sexual activity: Yes    Birth control/protection: None  Lifestyle   Physical activity    Days per week: 0 days    Minutes per session: Not on file   Stress: To some extent  Relationships   Social connections    Talks on phone: Three times a week    Gets together: Once a  week    Attends religious service: 1 to 4 times per year    Active member of club or organization: Not on file    Attends meetings of clubs or organizations: Not on file    Relationship status: Married   Intimate partner violence    Fear of current or ex partner: No    Emotionally abused: No    Physically abused: No    Forced sexual activity: No  Other Topics Concern   Not on file  Social History Narrative   Not on file    FAMILY HISTORY: Family History  Problem Relation Age of Onset   Alzheimer's disease Mother    Colon cancer Father    Breast cancer Neg Hx     ALLERGIES:  has No Known Allergies.  MEDICATIONS:  Current Outpatient Medications  Medication Sig Dispense Refill   B Complex Vitamins (VITAMIN B COMPLEX PO) Take 1 tablet by mouth daily.      CALCIUM PO Take 1 tablet by mouth daily.      Cholecalciferol (VITAMIN D) 2000 units CAPS Take  1 capsule (2,000 Units total) by mouth daily. 30 capsule    dexamethasone (DECADRON) 4 MG tablet Take 1 tablet (4 mg total) by mouth daily. 25 tablet 0   diphenhydrAMINE-zinc acetate (BENADRYL EXTRA STRENGTH) cream Apply 1 application topically 3 (three) times daily as needed for itching. 28.4 g 0   fentaNYL (DURAGESIC) 25 MCG/HR Place 1 patch onto the skin every 3 (three) days. 10 patch 0   HYDROcodone-acetaminophen (NORCO/VICODIN) 5-325 MG tablet Take 1 tablet by mouth daily as needed for moderate pain. 30 tablet 0   lidocaine-prilocaine (EMLA) cream Apply to affected area once 30 g 3   omeprazole (PRILOSEC) 40 MG capsule TAKE ONE CAPSULE BY MOUTH DAILY 90 capsule 1   potassium chloride SA (KLOR-CON) 20 MEQ tablet      sertraline (ZOLOFT) 50 MG tablet Take 1.5 tablets (75 mg total) by mouth daily. 45 tablet 1   atorvastatin (LIPITOR) 80 MG tablet Take 0.5 tablets (40 mg total) by mouth daily at 6 PM. (Patient not taking: Reported on 11/20/2019) 30 tablet 3   Cholecalciferol (VITAMIN D) 50 MCG (2000 UT) CAPS Take 1  capsule by mouth daily.     metoprolol tartrate (LOPRESSOR) 25 MG tablet Take 12.5 mg by mouth 2 (two) times daily.     nitroGLYCERIN (NITROSTAT) 0.4 MG SL tablet Place 1 tablet (0.4 mg total) under the tongue every 5 (five) minutes x 3 doses as needed for chest pain. (Patient not taking: Reported on 11/20/2019) 25 tablet 12   ondansetron (ZOFRAN) 8 MG tablet Take 1 tablet (8 mg total) by mouth 2 (two) times daily as needed for refractory nausea / vomiting. Start on day 3 after carboplatin chemo. (Patient not taking: Reported on 10/25/2019) 30 tablet 1   polyethylene glycol (MIRALAX / GLYCOLAX) packet Take 17 g by mouth daily as needed.      predniSONE (DELTASONE) 20 MG tablet Take 1 tablet (20 mg total) by mouth daily with breakfast. (Patient not taking: Reported on 11/20/2019) 5 tablet 0   prochlorperazine (COMPAZINE) 10 MG tablet TAKE 1 TABLET BY MOUTH EVERY 6 HOURS AS NEEDED FOR NAUSEA OR VOMITING (Patient not taking: Reported on 10/25/2019) 270 tablet 1   triamterene-hydrochlorothiazide (DYAZIDE) 37.5-25 MG capsule Take 1 each (1 capsule total) by mouth daily as needed. (Patient not taking: Reported on 10/25/2019) 90 capsule 0   No current facility-administered medications for this visit.    Facility-Administered Medications Ordered in Other Visits  Medication Dose Route Frequency Provider Last Rate Last Dose   heparin lock flush 100 unit/mL  500 Units Intravenous Once Earlie Server, MD       heparin lock flush 100 unit/mL  500 Units Intravenous Once Earlie Server, MD       sodium chloride flush (NS) 0.9 % injection 10 mL  10 mL Intravenous PRN Earlie Server, MD   10 mL at 01/09/19 0820   sodium chloride flush (NS) 0.9 % injection 10 mL  10 mL Intravenous PRN Earlie Server, MD   10 mL at 01/10/19 1400     PHYSICAL EXAMINATION: ECOG PERFORMANCE STATUS: 1 - Symptomatic but completely ambulatory Vitals:   11/20/19 0944  BP: 94/63  Pulse: 98  Resp: 18  Temp: 98.6 F (37 C)  SpO2: 98%   Filed  Weights   11/20/19 0944  Weight: 148 lb 11.2 oz (67.4 kg)   Physical Exam  Constitutional: She is oriented to person, place, and time. No distress.  HENT:  Head: Normocephalic and atraumatic.  Nose:  Nose normal.  Mouth/Throat: Oropharynx is clear and moist. No oropharyngeal exudate.  Eyes: Pupils are equal, round, and reactive to light. EOM are normal. No scleral icterus.  Neck: Normal range of motion. Neck supple.  Cardiovascular: Normal rate and regular rhythm.  No murmur heard. Pulmonary/Chest: Effort normal. No respiratory distress. She has no rales. She exhibits no tenderness.  Decreased breath sounds bilaterally, right side breath sound is more diminished.   Abdominal: Soft. She exhibits no distension. There is no abdominal tenderness.  Musculoskeletal: Normal range of motion.        General: No edema.  Neurological: She is alert and oriented to person, place, and time. No cranial nerve deficit. She exhibits normal muscle tone. Coordination normal.  Skin: Skin is warm and dry. She is not diaphoretic. No erythema.  Psychiatric: Affect normal.       LABORATORY DATA:  I have reviewed the data as listed Lab Results  Component Value Date   WBC 5.6 11/20/2019   HGB 12.4 11/20/2019   HCT 37.8 11/20/2019   MCV 89.8 11/20/2019   PLT 188 11/20/2019   Recent Labs    10/05/19 0900 10/26/19 0908 11/20/19 0932  NA 135 133* 132*  K 3.8 3.9 4.0  CL 104 101 102  CO2 25 22 22   GLUCOSE 101* 113* 106*  BUN 15 11 13   CREATININE 0.66 0.75 0.65  CALCIUM 9.3 9.3 8.8*  GFRNONAA >60 >60 >60  GFRAA >60 >60 >60  PROT 7.0 7.2 7.4  ALBUMIN 3.9 4.0 4.0  AST 13* 14* 14*  ALT 12 12 12   ALKPHOS 75 87 88  BILITOT 0.8 0.9 0.7    RADIOGRAPHIC STUDIES: I have personally reviewed the radiological images as listed and agreed with the findings in the report. 10/20/2018 CT chest w contrast . Large mediastinal mass involving both hila left much greater than right consistent with lung  carcinoma possibly small cell lung carcinoma. This mass causes narrowing of the left mainstem bronchus with resultant volume loss on the left and mediastinal shift to the Left. 2. Moderate size left pleural effusion. 3. Coronary artery calcifications and moderate thoracic aortic atherosclerosis. PET 10/26/2018 . Large left lung mass is markedly hypermetabolic in the confluent lymphadenopathy in the mediastinum and in bilateral hilar regions also shows marked hypermetabolism. 2. Hypermetabolic metastatic lymphadenopathy in the right neck and supraclavicular region. 3. Hypermetabolic lymphadenopathy in the hepato duodenal ligament consistent with metastatic disease. 4. Large hypermetabolic lesion posterior right acetabulum without correlate of CT finding. Features highly suspicious for bony metastatic disease. MRI brain 10/28/2018 1. No metastatic disease or acute intracranial abnormality identified. 2. 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  3. Moderately advanced but nonspecific cerebral white matter signal changes, most commonly due to chronic small vessel disease. Lesser signal changes in the deep gray matter and pons may also be small vessel related.  CT chest abdomen pelvis with contrast 12/15/2018 1 Marked response to therapy of thoracic adenopathy. 2. No new or progressive disease in the chest. 3. Coronary artery atherosclerosis. 4. 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 5. No new or progressive disease in the abdomen or pelvis 6. Pelvic floor laxity, cystocele with bladder wall thickening, suggesting a component of outlet obstruction. 7. Possible vague sclerosis within the posterior right acetabulum, in the region of hypermetabolism on prior PET. Likely an area of treated metastasis. 8. Central line tip in right brachiocephalic vein with surrounding occlusive thrombus. CT chest abdomen pelvis with contrast 01/24/2019 1. New extensive  gas throughout the bladder wall and  within the bladder lumen with underlying mild diffuse bladder wall thickening, compatible with acute emphysematous cystitis. 2. Two new subcentimeter low-attenuation lesions in the right kidney, nonspecific but worrisome for mild acute right pyelonephritis given the above bladder findings. No discrete/drainable renal abscess. No hydronephrosis. 3. Continued positive response to therapy with continued reduction in mediastinal adenopathy. No residual measurable left lung mass.Stable faint sclerosis at the site of the hypermetabolic right acetabular osseous lesion. No new or progressive metastatic disease in the chest, abdomen or pelvis. 4. Small bowel containing small periumbilical hernia without bowel complication at this time. 5. Aortic Atherosclerosis (ICD10-I70.0) and Emphysema (ICD10-J43.9).  05/05/2019 CT chest abdomen pelvis with contrast Near complete resolution of prior mediastinal lymphadenopathy.  Residual 12 mm short axis subcarinal node. Radiation changes in the lungs bilaterally.  Left lower lobe/lingular scarring.  No suspicious pulmonary nodules.  No evidence of metastatic disease in the abdomen or pelvis.  Ct Chest W Contrast  Result Date: 10/23/2019 CLINICAL DATA:  Extensive stage small cell left lung cancer diagnosed October 2019 status post chemotherapy and chest and brain radiation therapy. Ongoing chemotherapy. Restaging. Patient reports productive cough and right upper quadrant pain. EXAM: CT CHEST, ABDOMEN, AND PELVIS WITH CONTRAST TECHNIQUE: Multidetector CT imaging of the chest, abdomen and pelvis was performed following the standard protocol during bolus administration of intravenous contrast. CONTRAST:  133mL OMNIPAQUE IOHEXOL 300 MG/ML  SOLN COMPARISON:  07/27/2019 CT chest, abdomen and pelvis. FINDINGS: CT CHEST FINDINGS Cardiovascular: Normal heart size. No significant pericardial effusion/thickening. Three-vessel coronary atherosclerosis. Right internal jugular Port-A-Cath  terminates at the cavoatrial junction. Atherosclerotic nonaneurysmal thoracic aorta. Normal caliber pulmonary arteries. No central pulmonary emboli. Mediastinum/Nodes: No discrete thyroid nodules. Unremarkable esophagus. No pathologically enlarged axillary, mediastinal or hilar lymph nodes. Lungs/Pleura: No pneumothorax. No pleural effusion. Mild centrilobular and paraseptal emphysema. No acute consolidative airspace disease, lung masses or significant pulmonary nodules. Stable calcified subcentimeter right pulmonary granulomas. Musculoskeletal: No aggressive appearing focal osseous lesions. New mild superior T7 vertebral compression deformity. Mild thoracic spondylosis. CT ABDOMEN PELVIS FINDINGS Hepatobiliary: Normal liver with no liver mass. Cholecystectomy. No biliary ductal dilatation. Pancreas: Normal, with no mass or duct dilation. Spleen: Normal size. No mass. Adrenals/Urinary Tract: Normal adrenals. Normal kidneys with no hydronephrosis and no renal mass. Small cystocele. Otherwise normal bladder. Stomach/Bowel: Normal non-distended stomach. Stable small to moderate paraumbilical hernia containing multiple small bowel loops. No small bowel dilatation, focal caliber transition, wall thickening or pneumatosis. Oral contrast transits to the cecum. Appendectomy. Mild diffuse colonic diverticulosis, with no large bowel wall thickening or acute pericolonic fat stranding. Vascular/Lymphatic: Atherosclerotic nonaneurysmal abdominal aorta. Patent portal, splenic, hepatic and renal veins. No pathologically enlarged lymph nodes in the abdomen or pelvis. Reproductive: Grossly normal uterus.  No adnexal mass. Other: No pneumoperitoneum, ascites or focal fluid collection. Musculoskeletal: No aggressive appearing focal osseous lesions. The hypermetabolic right acetabular lesion on 10/26/2018 PET-CT study is not evident on today's CT scan. Mild lumbar spondylosis. IMPRESSION: 1. No findings of metastatic disease in the  chest, abdomen or pelvis. No evidence of recurrent pulmonary neoplasm. 2. New mild superior T7 vertebral compression fracture without evidence of underlying pathologic osseous lesion. 3. Stable small to moderate paraumbilical ventral abdominal hernia containing small bowel loops, without acute bowel complication. 4. Aortic Atherosclerosis (ICD10-I70.0) and Emphysema (ICD10-J43.9). Electronically Signed   By: Ilona Sorrel M.D.   On: 10/23/2019 10:29   Ct Abdomen Pelvis W Contrast  Result Date: 10/23/2019 CLINICAL DATA:  Extensive stage  small cell left lung cancer diagnosed October 2019 status post chemotherapy and chest and brain radiation therapy. Ongoing chemotherapy. Restaging. Patient reports productive cough and right upper quadrant pain. EXAM: CT CHEST, ABDOMEN, AND PELVIS WITH CONTRAST TECHNIQUE: Multidetector CT imaging of the chest, abdomen and pelvis was performed following the standard protocol during bolus administration of intravenous contrast. CONTRAST:  12mL OMNIPAQUE IOHEXOL 300 MG/ML  SOLN COMPARISON:  07/27/2019 CT chest, abdomen and pelvis. FINDINGS: CT CHEST FINDINGS Cardiovascular: Normal heart size. No significant pericardial effusion/thickening. Three-vessel coronary atherosclerosis. Right internal jugular Port-A-Cath terminates at the cavoatrial junction. Atherosclerotic nonaneurysmal thoracic aorta. Normal caliber pulmonary arteries. No central pulmonary emboli. Mediastinum/Nodes: No discrete thyroid nodules. Unremarkable esophagus. No pathologically enlarged axillary, mediastinal or hilar lymph nodes. Lungs/Pleura: No pneumothorax. No pleural effusion. Mild centrilobular and paraseptal emphysema. No acute consolidative airspace disease, lung masses or significant pulmonary nodules. Stable calcified subcentimeter right pulmonary granulomas. Musculoskeletal: No aggressive appearing focal osseous lesions. New mild superior T7 vertebral compression deformity. Mild thoracic spondylosis. CT  ABDOMEN PELVIS FINDINGS Hepatobiliary: Normal liver with no liver mass. Cholecystectomy. No biliary ductal dilatation. Pancreas: Normal, with no mass or duct dilation. Spleen: Normal size. No mass. Adrenals/Urinary Tract: Normal adrenals. Normal kidneys with no hydronephrosis and no renal mass. Small cystocele. Otherwise normal bladder. Stomach/Bowel: Normal non-distended stomach. Stable small to moderate paraumbilical hernia containing multiple small bowel loops. No small bowel dilatation, focal caliber transition, wall thickening or pneumatosis. Oral contrast transits to the cecum. Appendectomy. Mild diffuse colonic diverticulosis, with no large bowel wall thickening or acute pericolonic fat stranding. Vascular/Lymphatic: Atherosclerotic nonaneurysmal abdominal aorta. Patent portal, splenic, hepatic and renal veins. No pathologically enlarged lymph nodes in the abdomen or pelvis. Reproductive: Grossly normal uterus.  No adnexal mass. Other: No pneumoperitoneum, ascites or focal fluid collection. Musculoskeletal: No aggressive appearing focal osseous lesions. The hypermetabolic right acetabular lesion on 10/26/2018 PET-CT study is not evident on today's CT scan. Mild lumbar spondylosis. IMPRESSION: 1. No findings of metastatic disease in the chest, abdomen or pelvis. No evidence of recurrent pulmonary neoplasm. 2. New mild superior T7 vertebral compression fracture without evidence of underlying pathologic osseous lesion. 3. Stable small to moderate paraumbilical ventral abdominal hernia containing small bowel loops, without acute bowel complication. 4. Aortic Atherosclerosis (ICD10-I70.0) and Emphysema (ICD10-J43.9). Electronically Signed   By: Ilona Sorrel M.D.   On: 10/23/2019 10:29     ASSESSMENT & PLAN:  1. Small cell lung cancer ([place])   2. Encounter for antineoplastic immunotherapy   3. Memory loss   4. Thoracic compression fracture, closed, initial encounter ([place])   5. Osteopenia, unspecified  location   6. Acute midline thoracic back pain    #Extensive small cell lung cancer S/p 4 cycles of Carboplatin, Etoposide and Tecentriq.   Status post chest and whole brain radiation.  Labs reviewed and discussed with patient. Counts acceptable to proceed with Tecentriq maintenance today. Interval CT chest abdomen pelvis showed no metastatic disease/recurrence. Repeat MRI brain  #T7 vertebral compression fracture without evidence of underlying pathological osseous lesions. She continues to be symptomatic.  Recommend patient to continue fentanyl patch 25 MCG every 72 hours.  Refills were sent to [facility] as instructed I will obtain MRI thoracic spine for further evaluation.  #Osteopenia, continue calcium and vitamin D supplementation.  Metastatic small cell lung cancer/osteopenia, recommend Zometa 3.5 mg monthly.  She received Zometa on 11/01/2019  #Forgetfulness, likely secondary to whole brain radiation.  Discussed with patient about coping skills. I suggest patient to  bring a family member to her next visit.  Depression has been well controlled.  Continue Zoloft... Follow-up with palliative care. Follow up in 3 weeks for eval with next cycle of tecentriq.    Earlie Server, MD, PhD

## 2019-11-22 ENCOUNTER — Telehealth: Payer: Self-pay | Admitting: *Deleted

## 2019-11-22 NOTE — Telephone Encounter (Signed)
I only give her a short course. No intent to do steroid long term.

## 2019-11-22 NOTE — Telephone Encounter (Signed)
Patient informed of physician response she stated OK, then thanked me.

## 2019-11-22 NOTE — Telephone Encounter (Signed)
Patient asking if she is supposed to be steroids or not because she does not have any. Please advise

## 2019-11-28 ENCOUNTER — Other Ambulatory Visit: Payer: Self-pay

## 2019-11-28 ENCOUNTER — Other Ambulatory Visit: Payer: Self-pay | Admitting: *Deleted

## 2019-11-28 NOTE — Telephone Encounter (Signed)
Pt left message requesting refill of hydrocodone. Encounter already made for refill request. Disregard this encounter.

## 2019-11-29 MED ORDER — HYDROCODONE-ACETAMINOPHEN 5-325 MG PO TABS: 1.0000 | ORAL_TABLET | Freq: Every day | ORAL | 0 refills | Status: DC | PRN

## 2019-12-01 ENCOUNTER — Ambulatory Visit
Admission: RE | Admit: 2019-12-01 | Discharge: 2019-12-01 | Disposition: A | Discharge status: Home / Self Care | Payer: Medicare Other | Source: Ambulatory Visit | Attending: Oncology | Admitting: Oncology

## 2019-12-01 ENCOUNTER — Other Ambulatory Visit: Payer: Self-pay

## 2019-12-01 DIAGNOSIS — C3490 Malignant neoplasm of unspecified part of unspecified bronchus or lung: Secondary | ICD-10-CM | POA: Insufficient documentation

## 2019-12-01 MED ORDER — GADOBUTROL 1 MMOL/ML IV SOLN
6.0000 mL | Freq: Once | INTRAVENOUS | Status: AC | PRN
  Administered 2019-12-01: 6 mL via INTRAVENOUS

## 2019-12-04 ENCOUNTER — Ambulatory Visit: Payer: Medicare Other

## 2019-12-04 ENCOUNTER — Ambulatory Visit: Payer: Medicare Other | Admitting: Oncology

## 2019-12-04 ENCOUNTER — Other Ambulatory Visit: Payer: Medicare Other

## 2019-12-11 ENCOUNTER — Other Ambulatory Visit: Payer: Self-pay

## 2019-12-11 ENCOUNTER — Encounter: Payer: Self-pay | Admitting: Oncology

## 2019-12-11 ENCOUNTER — Inpatient Hospital Stay (HOSPITAL_BASED_OUTPATIENT_CLINIC_OR_DEPARTMENT_OTHER): Payer: Medicare Other | Admitting: Hospice and Palliative Medicine

## 2019-12-11 ENCOUNTER — Inpatient Hospital Stay: Payer: Medicare Other

## 2019-12-11 ENCOUNTER — Inpatient Hospital Stay: Payer: Medicare Other | Admitting: *Deleted

## 2019-12-11 ENCOUNTER — Inpatient Hospital Stay: Payer: Medicare Other | Attending: Oncology | Admitting: Oncology

## 2019-12-11 VITALS — BP 119/68 | HR 84 | Temp 97.7°F | Resp 16 | Wt 148.1 lb

## 2019-12-11 DIAGNOSIS — R413 Other amnesia: Secondary | ICD-10-CM

## 2019-12-11 DIAGNOSIS — Z5112 Encounter for antineoplastic immunotherapy: Secondary | ICD-10-CM

## 2019-12-11 DIAGNOSIS — C3490 Malignant neoplasm of unspecified part of unspecified bronchus or lung: Secondary | ICD-10-CM

## 2019-12-11 DIAGNOSIS — E860 Dehydration: Secondary | ICD-10-CM

## 2019-12-11 DIAGNOSIS — Z95828 Presence of other vascular implants and grafts: Secondary | ICD-10-CM

## 2019-12-11 DIAGNOSIS — G893 Neoplasm related pain (acute) (chronic): Secondary | ICD-10-CM | POA: Diagnosis not present

## 2019-12-11 DIAGNOSIS — Z515 Encounter for palliative care: Secondary | ICD-10-CM | POA: Diagnosis not present

## 2019-12-11 DIAGNOSIS — S22000A Wedge compression fracture of unspecified thoracic vertebra, initial encounter for closed fracture: Secondary | ICD-10-CM

## 2019-12-11 DIAGNOSIS — M8580 Other specified disorders of bone density and structure, unspecified site: Secondary | ICD-10-CM | POA: Diagnosis not present

## 2019-12-11 DIAGNOSIS — Z87891 Personal history of nicotine dependence: Secondary | ICD-10-CM | POA: Diagnosis not present

## 2019-12-11 LAB — COMPREHENSIVE METABOLIC PANEL
ALT: 12 U/L (ref 0–44)
AST: 14 U/L — ABNORMAL LOW (ref 15–41)
Albumin: 3.9 g/dL (ref 3.5–5.0)
Alkaline Phosphatase: 65 U/L (ref 38–126)
Anion gap: 9 (ref 5–15)
BUN: 13 mg/dL (ref 8–23)
CO2: 25 mmol/L (ref 22–32)
Calcium: 9.2 mg/dL (ref 8.9–10.3)
Chloride: 105 mmol/L (ref 98–111)
Creatinine, Ser: 0.79 mg/dL (ref 0.44–1.00)
GFR calc Af Amer: >60 mL/min (ref >60)
GFR calc non Af Amer: >60 mL/min (ref >60)
Glucose, Bld: 98 mg/dL (ref 70–99)
Potassium: 3.7 mmol/L (ref 3.5–5.1)
Sodium: 139 mmol/L (ref 135–145)
Total Bilirubin: 0.6 mg/dL (ref 0.3–1.2)
Total Protein: 6.9 g/dL (ref 6.5–8.1)

## 2019-12-11 LAB — CBC WITH DIFFERENTIAL/PLATELET
Abs Immature Granulocytes: 0.02 10*3/uL (ref 0.00–0.07)
Basophils Absolute: 0 10*3/uL (ref 0.0–0.1)
Basophils Relative: 1 %
Eosinophils Absolute: 0.2 10*3/uL (ref 0.0–0.5)
Eosinophils Relative: 5 %
HCT: 36.5 % (ref 36.0–46.0)
Hemoglobin: 11.6 g/dL — ABNORMAL LOW (ref 12.0–15.0)
Immature Granulocytes: 0 %
Lymphocytes Relative: 20 %
Lymphs Abs: 1 10*3/uL (ref 0.7–4.0)
MCH: 28.6 pg (ref 26.0–34.0)
MCHC: 31.8 g/dL (ref 30.0–36.0)
MCV: 90.1 fL (ref 80.0–100.0)
Monocytes Absolute: 0.8 10*3/uL (ref 0.1–1.0)
Monocytes Relative: 16 %
Neutro Abs: 3 10*3/uL (ref 1.7–7.7)
Neutrophils Relative %: 58 %
Platelets: 162 10*3/uL (ref 150–400)
RBC: 4.05 MIL/uL (ref 3.87–5.11)
RDW: 13.2 % (ref 11.5–15.5)
WBC: 5.1 10*3/uL (ref 4.0–10.5)
nRBC: 0 % (ref 0.0–0.2)

## 2019-12-11 MED ORDER — HEPARIN SOD (PORK) LOCK FLUSH 100 UNIT/ML IV SOLN
INTRAVENOUS | Status: AC
  Filled 2019-12-11: qty 5

## 2019-12-11 MED ORDER — ZOLEDRONIC ACID 4 MG/5ML IV CONC
3.5000 mg | Freq: Once | INTRAVENOUS | Status: AC
  Administered 2019-12-11: 3.5 mg via INTRAVENOUS
  Filled 2019-12-11: qty 4.38

## 2019-12-11 MED ORDER — SODIUM CHLORIDE 0.9 % IV SOLN
Freq: Once | INTRAVENOUS | Status: AC
  Filled 2019-12-11: qty 250

## 2019-12-11 MED ORDER — DEXAMETHASONE SODIUM PHOSPHATE 10 MG/ML IJ SOLN
10.0000 mg | Freq: Once | INTRAMUSCULAR | Status: AC
  Administered 2019-12-11: 10 mg via INTRAVENOUS
  Filled 2019-12-11: qty 1

## 2019-12-11 MED ORDER — HEPARIN SOD (PORK) LOCK FLUSH 100 UNIT/ML IV SOLN
500.0000 [IU] | Freq: Once | INTRAVENOUS | Status: AC | PRN
  Administered 2019-12-11: 500 [IU]
  Filled 2019-12-11: qty 5

## 2019-12-11 MED ORDER — SODIUM CHLORIDE 0.9 % IV SOLN
1200.0000 mg | Freq: Once | INTRAVENOUS | Status: AC
  Administered 2019-12-11: 1200 mg via INTRAVENOUS
  Filled 2019-12-11: qty 20

## 2019-12-11 MED ORDER — SODIUM CHLORIDE 0.9% FLUSH
10.0000 mL | Freq: Once | INTRAVENOUS | Status: AC
  Administered 2019-12-11: 08:00:00 10 mL via INTRAVENOUS
  Filled 2019-12-11: qty 10

## 2019-12-11 NOTE — Progress Notes (Signed)
[place]  [phone] [phone]   Name: Kristi [name] [facility] Date: 12/11/2019 MRN: [id number]  DOB: 12/08/1946  Patient Care Team: Glean Hess, MD as PCP - General (Internal Medicine) Charolette Forward, MD as Consulting Physician (Cardiology) Telford Nab, RN as Registered Nurse    REASON FOR CONSULTATION: Palliative Care consult requested for this 73 y.o. female with multiple medical problems including stage IV small cell lung cancer.  PMH also notable for CAD with history of acute MI, claustrophobia, hypertension, and hyperlipidemia.  Patient was recently seen and examined by pulmonology due to worsening exertional dyspnea.  She was found to have a central obstructing mass and mediastinal adenopathy on CT.  Patient is status post bronchoscopy with biopsy confirming small cell lung cancer.  PET CT scan revealed hypermetabolic areas involving lymphadenopathy in the neck and supraclavicular region, hepatoduodenal ligament, and disease of the right acetabulum.  Patient is pending s/p 4 cycles chemotherapy with Atezolizumab, Carboplatin, and Etoposide.    She is also status post RT to the chest and whole brain.  Patient is now on maintenance  immunotherapy.  Palliative care was asked to help address goals and symptoms.   SOCIAL HISTORY:    Patient is married and lives at home with her husband of 43 years.  They moved here from [place], [place] around 4 years ago.  Patient has 2 sons and a daughter.  One son lives in [place] and the other 2 children are in [place].  Patient used to work for a [employer] and then later for the local [employer].  ADVANCE DIRECTIVES:  Not on file  CODE STATUS: DNR (MOST form completed on 11/24/18)  PAST MEDICAL HISTORY: Past Medical History:  Diagnosis Date  . Acute MI, inferoposterior wall ([place]) 09/30/2014  . Claustrophobia   . Coronary artery disease   .  Hypercholesteremia   . Hypertension   . MI, old   . 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 [facility]) 10/27/2018    PAST SURGICAL HISTORY:  Past Surgical History:  Procedure Laterality Date  . APPENDECTOMY     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  . BLADDER NECK SUSPENSION    . CHOLECYSTECTOMY N/A 08/14/2018   Procedure: LAPAROSCOPIC CHOLECYSTECTOMY;  Surgeon: Jules Husbands, MD;  Location: [facility] ORS;  Service: General;  Laterality: N/A;  . CHOLECYSTECTOMY  11/2018  . CORONARY STENT PLACEMENT    . ENDOBRONCHIAL ULTRASOUND N/A 10/21/2018   Procedure: ENDOBRONCHIAL ULTRASOUND;  Surgeon: Laverle Hobby, MD;  Location: [facility] ORS;  Service: Pulmonary;  Laterality: N/A;  . HERNIA REPAIR    . IR FLUORO GUIDE CV LINE RIGHT  12/19/2018  . LEFT HEART CATHETERIZATION WITH CORONARY ANGIOGRAM N/A 09/29/2014   Procedure: LEFT HEART CATHETERIZATION WITH CORONARY ANGIOGRAM;  Surgeon: Clent Demark, MD;  Location: [facility] CATH LAB;  Service: Cardiovascular;  Laterality: N/A;  . PORTA CATH INSERTION N/A 01/02/2019   Procedure: PORTA CATH INSERTION;  Surgeon: Algernon Huxley, MD;  Location: [place] CV LAB;  Service: Cardiovascular;  Laterality: N/A;  . PORTACATH PLACEMENT Right 10/28/2018   Procedure: INSERTION PORT-A-CATH;  Surgeon: Jules Husbands, MD;  Location: [facility] ORS;  Service: General;  Laterality: Right;    HEMATOLOGY/ONCOLOGY HISTORY:  Oncology History Overview Note  Kristi [name] is a  73 y.o.  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   10/20/2018 CT chest with contrast showed large  mediastinal mass involving both hilar, left greater than right, consistent with lung carcinoma, The mass causes narrowing of the left mainstem bronchus with resultant volume loss on the left and a mediastinal shift to the left.  Moderate size left pleural effusion. Patient underwent E bus bronchoscopy 10/21/2018 Left mainstem bronchus transbronchial forcep biopsy showed small cell  carcinoma.   # Nov 2019- Jan 2020 s/p 4 cycles of Carbo/Etoposide/Tecentriq #  01/24/2019 interim CT scan done which showed continued positive response to therapy with continued reduction in mediastinal adenopathy.  No residual measurable left lung mass. CT findings of acute emphysematous cystitis. Urology did not feel that patient has pyelonephritis and recommend treatment with antibiotics because patient's immunocompromise. Patient finished treatment.    # Chest and whole brain radiation finished in May 2020 # 04/25/2019 resume Tecentriq every 3 weeks.   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 [place])  10/27/2018 Initial Diagnosis   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 [facility])   11/02/2018 -  Chemotherapy   The patient had palonosetron (ALOXI) injection 0.25 mg, 0.25 mg, Intravenous,  Once, 4 of 4 cycles Administration: 0.25 mg (11/02/2018), 0.25 mg (11/23/2018), 0.25 mg (12/19/2018), 0.25 mg (01/09/2019) pegfilgrastim-cbqv (UDENYCA) injection 6 mg, 6 mg, Subcutaneous, Once, 4 of 4 cycles Administration: 6 mg (11/07/2018), 6 mg (11/28/2018), 6 mg (12/23/2018), 6 mg (01/12/2019) CARBOplatin (PARAPLATIN) 390 mg in sodium chloride 0.9 % 250 mL chemo infusion, 390 mg (100 % of original dose 394.5 mg), Intravenous,  Once, 4 of 4 cycles Dose modification:   (original dose 394.5 mg, Cycle 1) Administration: 390 mg (11/02/2018), 390 mg (11/23/2018), 390 mg (12/19/2018), 390 mg (01/09/2019) etoposide (VEPESID) 180 mg in sodium chloride 0.9 % 500 mL chemo infusion, 100 mg/m2 = 180 mg, Intravenous,  Once, 4 of 4 cycles Administration: 180 mg (11/02/2018), 180 mg (11/03/2018), 180 mg (11/04/2018), 180 mg (11/23/2018), 180 mg (11/24/2018), 180 mg (11/25/2018), 180 mg (12/19/2018), 180 mg (12/20/2018), 180 mg (12/22/2018), 180 mg (01/09/2019), 180 mg (01/10/2019), 180 mg (01/11/2019) fosaprepitant (EMEND) 150 mg, dexamethasone (DECADRON) 12 mg in sodium chloride 0.9 % 145 mL IVPB, , Intravenous,  Once, 4 of 4 cycles Administration:   (11/02/2018),  (11/23/2018),  (12/19/2018),  (01/09/2019) atezolizumab (TECENTRIQ) 1,200 mg in sodium chloride 0.9 % 250 mL chemo infusion, 1,200 mg, Intravenous, Once, 13 of 15 cycles Administration: 1,200 mg (11/23/2018), 1,200 mg (12/19/2018), 1,200 mg (01/09/2019), 1,200 mg (04/24/2019), 1,200 mg (05/18/2019), 1,200 mg (06/15/2019), 1,200 mg (07/13/2019), 1,200 mg (08/03/2019), 1,200 mg (08/24/2019), 1,200 mg (09/14/2019), 1,200 mg (10/05/2019), 1,200 mg (10/26/2019), 1,200 mg (11/20/2019)  for chemotherapy treatment.      ALLERGIES:  has No Known Allergies.  MEDICATIONS:  Current Outpatient Medications  Medication Sig Dispense Refill  . atorvastatin (LIPITOR) 80 MG tablet Take 0.5 tablets (40 mg total) by mouth daily at 6 PM. (Patient not taking: Reported on 11/20/2019) 30 tablet 3  . CALCIUM PO Take 1 tablet by mouth daily.     . Cholecalciferol (VITAMIN D) 50 MCG (2000 UT) CAPS Take 1 capsule by mouth daily.    [name] Kitchen dexamethasone (DECADRON) 4 MG tablet Take 1 tablet (4 mg total) by mouth daily. (Patient not taking: Reported on 12/11/2019) 25 tablet 0  . diphenhydrAMINE-zinc acetate (BENADRYL EXTRA STRENGTH) cream Apply 1 application topically 3 (three) times daily as needed for itching. 28.4 g 0  . fentaNYL (DURAGESIC) 25 MCG/HR Place 1 patch onto the skin every 3 (three) days. 10 patch 0  . HYDROcodone-acetaminophen (NORCO/VICODIN) 5-325 MG tablet Take 1 tablet  by mouth daily as needed for moderate pain. 30 tablet 0  . lidocaine-prilocaine (EMLA) cream Apply to affected area once 30 g 3  . metoprolol tartrate (LOPRESSOR) 25 MG tablet Take 12.5 mg by mouth 2 (two) times daily.    . nitroGLYCERIN (NITROSTAT) 0.4 MG SL tablet Place 1 tablet (0.4 mg total) under the tongue every 5 (five) minutes x 3 doses as needed for chest pain. (Patient not taking: Reported on 11/20/2019) 25 tablet 12  . omeprazole (PRILOSEC) 40 MG capsule TAKE ONE CAPSULE BY MOUTH DAILY 90 capsule 1  . ondansetron (ZOFRAN) 8 MG tablet  Take 1 tablet (8 mg total) by mouth 2 (two) times daily as needed for refractory nausea / vomiting. Start on day 3 after carboplatin chemo. (Patient not taking: Reported on 12/11/2019) 30 tablet 1  . polyethylene glycol (MIRALAX / GLYCOLAX) packet Take 17 g by mouth daily as needed.     . predniSONE (DELTASONE) 20 MG tablet Take 1 tablet (20 mg total) by mouth daily with breakfast. (Patient not taking: Reported on 11/20/2019) 5 tablet 0  . prochlorperazine (COMPAZINE) 10 MG tablet TAKE 1 TABLET BY MOUTH EVERY 6 HOURS AS NEEDED FOR NAUSEA OR VOMITING (Patient not taking: Reported on 12/11/2019) 270 tablet 1  . sertraline (ZOLOFT) 50 MG tablet Take 1.5 tablets (75 mg total) by mouth daily. 45 tablet 1  . triamterene-hydrochlorothiazide (DYAZIDE) 37.5-25 MG capsule Take 1 each (1 capsule total) by mouth daily as needed. (Patient not taking: Reported on 10/25/2019) 90 capsule 0  . vitamin B-12 (CYANOCOBALAMIN) 1000 MCG tablet Take 1,000 mcg by mouth daily.     No current facility-administered medications for this visit.   Facility-Administered Medications Ordered in Other Visits  Medication Dose Route Frequency Provider Last Rate Last Admin  . heparin lock flush 100 unit/mL  500 Units Intravenous Once Earlie Server, MD      . heparin lock flush 100 unit/mL  500 Units Intravenous Once Earlie Server, MD      . sodium chloride flush (NS) 0.9 % injection 10 mL  10 mL Intravenous PRN Earlie Server, MD   10 mL at 01/09/19 0820  . sodium chloride flush (NS) 0.9 % injection 10 mL  10 mL Intravenous PRN Earlie Server, MD   10 mL at 01/10/19 1400    VITAL SIGNS: There were no vitals taken for this visit. There were no vitals filed for this visit.  Estimated body mass index is 23.2 kg/m as calculated from the following:   Height as of 07/06/19: 5\' 7"  (1.702 m).   Weight as of an earlier encounter on 12/11/19: 148 lb 1.6 oz (67.2 kg).  LABS: CBC:    Component Value Date/Time   WBC 5.1 12/11/2019 0821   HGB 11.6 (L)  12/11/2019 0821   HGB 14.5 09/19/2018 1038   HCT 36.5 12/11/2019 0821   HCT 42.7 09/19/2018 1038   PLT 162 12/11/2019 0821   PLT 265 09/19/2018 1038   MCV 90.1 12/11/2019 0821   MCV 91 09/19/2018 1038   NEUTROABS 3.0 12/11/2019 0821   NEUTROABS 5.1 09/19/2018 1038   LYMPHSABS 1.0 12/11/2019 0821   LYMPHSABS 2.0 09/19/2018 1038   MONOABS 0.8 12/11/2019 0821   EOSABS 0.2 12/11/2019 0821   EOSABS 0.3 09/19/2018 1038   BASOSABS 0.0 12/11/2019 0821   BASOSABS 0.1 09/19/2018 1038   Comprehensive Metabolic Panel:    Component Value Date/Time   NA 139 12/11/2019 0821   NA 136 09/19/2018 1038   K  3.7 12/11/2019 0821   CL 105 12/11/2019 0821   CO2 25 12/11/2019 0821   BUN 13 12/11/2019 0821   BUN 6 (L) 09/19/2018 1038   CREATININE 0.79 12/11/2019 0821   GLUCOSE 98 12/11/2019 0821   CALCIUM 9.2 12/11/2019 0821   AST 14 (L) 12/11/2019 0821   ALT 12 12/11/2019 0821   ALKPHOS 65 12/11/2019 0821   BILITOT 0.6 12/11/2019 0821   BILITOT 0.5 09/19/2018 1038   PROT 6.9 12/11/2019 0821   PROT 7.4 09/19/2018 1038   ALBUMIN 3.9 12/11/2019 0821   ALBUMIN 4.3 09/19/2018 1038    RADIOGRAPHIC STUDIES: MR Brain W Wo Contrast  Result Date: 12/01/2019 CLINICAL DATA:  73 year old female with history of small cell lung cancer. Status post prophylactic cranial radiation completed in May or June this year. Restaging. EXAM: MRI HEAD WITHOUT AND WITH CONTRAST TECHNIQUE: Multiplanar, multiecho pulse sequences of the brain and surrounding structures were obtained without and with intravenous contrast. CONTRAST:  61mL GADAVIST GADOBUTROL 1 MMOL/ML IV SOLN COMPARISON:  Brain MRI 10/28/2018. FINDINGS: Brain: No restricted diffusion to suggest acute infarction. No midline shift, mass effect, evidence of mass lesion, ventriculomegaly, extra-axial collection or acute intracranial hemorrhage. Cervicomedullary junction and pituitary are within normal limits. No abnormal enhancement identified. No dural thickening.  Progressed and now widespread bilateral cerebral white matter T2 and FLAIR hyperintensity, involving the deep white matter capsules also. Cerebral volume appears stable. Similar patchy and confluent new or increased T2 signal in the pons and deep cerebellar nuclei. No cortical encephalomalacia or chronic cerebral blood products identified. Vascular: Major intracranial vascular flow voids are stable, with loss of the distal left vertebral artery flow void as noted in 2019. The major dural venous sinuses are enhancing and appear to be patent. Skull and upper cervical spine: Negative visible cervical spine aside from degenerative changes. Negative visible spinal cord. Visualized bone marrow signal is within normal limits. Sinuses/Orbits: Stable and negative. Other: Trace left mastoid fluid is stable. Visible internal auditory structures appear normal. Scalp and face soft tissues appear negative. IMPRESSION: Sequelae of whole brain radiation. No metastatic disease or acute intracranial abnormality. Electronically Signed   By: Genevie Ann M.D.   On: 12/01/2019 11:45   MR Thoracic Spine W Wo Contrast  Result Date: 12/01/2019 CLINICAL DATA:  Upper back pain for approximately 3 weeks. The patient has suffered several falls. EXAM: MRI THORACIC WITHOUT AND WITH CONTRAST TECHNIQUE: Multiplanar and multiecho pulse sequences of the thoracic spine were obtained without and with intravenous contrast. CONTRAST:  6 mL GADAVIST IV SOLN COMPARISON:  CT chest, abdomen and pelvis 07/27/2019 and 10/23/2019. FINDINGS: MRI THORACIC SPINE FINDINGS Alignment:  Maintained.  Exaggeration of thoracic kyphosis is noted. Vertebrae: The patient has a severe compression fracture of T7 with vertebra plana deformity identified. There is marrow edema and enhancement in the vertebral body. Mild marrow edema in the T7 pedicles is greater on the right. There was a mild superior endplate compression fracture at T7 on the 10/23/2019 CT and T7 was intact  on the 07/27/2019 CT. Minimal marrow edema is seen in the anterior aspect of the inferior endplate of T6. Cord:  Normal signal throughout. Paraspinal and other soft tissues: Negative. Disc levels: T1-2 through T5-6 are negative. T6-7: Mild bony retropulsion is seen off the posterior margin of T7 but there is no stenosis. No epidural hematoma is identified. T8-9: Shallow left paracentral protrusion indents the ventral thecal sac but the central canal and foramina are open. T9-10: Shallow right paracentral protrusion  without stenosis. T10-11: Minimal disc bulge without stenosis. T11-12: Minimal disc bulge without stenosis. T12-L1: Negative. 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 There is mild bony retropulsion off the posterior margin of T7 but no stenosis. Small focus of marrow edema in the anterior, inferior endplate of T6 may be due to contusion without fracture. 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 Electronically Signed   By: Inge Rise M.D.   On: 12/01/2019 11:29    PERFORMANCE STATUS (ECOG) : 1 - Symptomatic but completely ambulatory  Review of Systems Unless otherwise noted, a complete review of systems is negative.  Physical Exam General: NAD, frail appearing, thin Pulmonary: Unlabored Extremities: no edema, no joint deformities Skin: no rashes Neurological: Weakness but otherwise nonfocal  IMPRESSION: Patient was an add-on to the schedule today due to pain.  Patient has had severe back pain along the mid spine since she failed down some steps several months ago.  Pain is been worse with movement.  She was recently started on transdermal fentanyl 25 mcg every 72 hours and was rotated from OxyContin.  Patient has found that the  fentanyl has helped significantly to stabilize her pain throughout the day.  She still has episodes of intense breakthrough pain, which is managed mostly with as needed Norco.  MRI of the spine reveals an acute/subacute compression fracture of T7 with near vertebra plana deformity likely from osteoporotic or posttraumatic injury.  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  We discussed options for management including continued conservative approach with use of opioid analgesia versus referral for vertebral augmentation.  Patient seems reluctant to consider a surgical approach.  We also discussed possible referral to PT but patient was undecided.  We will speak with Dr. Tasia Catchings about initiation of bisphosphonate  PLAN: -Continue current scope of treatment -Continue transdermal fentanyl 25 mcg every 72 hours with use of as needed Norco for breakthrough pain -Consideration for vertebral augmentation could be considered if conservative management fails -Would recommend PT eval for weakness -Would also suggest initiation of bisphosphonate -RTC in 3 weeks   Patient expressed understanding and was in agreement with this plan. She also understands that She can call the clinic at any time with any questions, concerns, or complaints.     Time Total: 20 minutes  Visit consisted of counseling and education dealing with the complex and emotionally intense issues of symptom management and palliative care in the setting of serious and potentially life-threatening illness.Greater than 50%  of this time was spent counseling and coordinating care related to the above assessment and plan.  Signed by: Altha Harm, PhD, NP-C [phone] (Work Cell)

## 2019-12-11 NOTE — Progress Notes (Signed)
Hematology/Oncology Follow up note [facility] [phone] [phone]   Patient Care Team: Glean Hess, MD as PCP - General (Internal Medicine) Charolette Forward, MD as Consulting Physician (Cardiology) Telford Nab, RN as Registered Nurse  REFERRING PROVIDER: Dr. Felicie Morn REASON FOR VISIT:  Follow-up for small cell lung cancer,   HISTORY OF PRESENTING ILLNESS:  Kristi [name] is a  73 y.o.  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  10/20/2018 CT chest with contrast showed large mediastinal mass involving both hilar, left greater than right, consistent with lung carcinoma, The mass causes narrowing of the left mainstem bronchus with resultant volume loss on the left and a mediastinal shift to the left.  Moderate size left pleural effusion. 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  # MRI brain negative.  # Nov 2019- Jan 2020 s/p 4 cycles of Carbo/Etoposide/Tecentriq #  01/24/2019 interim CT scan done which showed continued positive response to therapy with continued reduction in mediastinal adenopathy.  No residual measurable left lung mass. CT findings of acute emphysematous cystitis. Urology did not feel that patient has pyelonephritis and recommend treatment with antibiotics because patient's immunocompromise. Patient finished treatment.   # Chest and whole brain radiation finished in May 2020 # 04/25/2019 resume Tecentriq every 3 weeks.  INTERVAL HISTORY Kristi [name] is a 73 y.o. female who has above history reviewed by me today presents for evaluation prior to immunotherapy for treatment of extensive small cell lung cancer. Currently patient is on immunotherapy maintenance. Patient continues to have upper back pain from compression fracture, 4-5 out of 10. Patient takes fentanyl patch and  Norco as needed.  Reports the combination help with the pain. Continues to be quite forgetful. She is accompanied by her husband today. Denies any dizziness, nausea, vomiting, fever, dysuria, diarrhea, skin rash. Occasional cough, chronic She has received Zometa on 11/01/2019.  Tolerates well.  Review of Systems  Constitutional: Positive for fatigue. Negative for appetite change, chills and fever.  HENT:   Negative for hearing loss and voice change.   Eyes: Negative for eye problems.  Respiratory: Negative for chest tightness, cough and shortness of breath.   Cardiovascular: Negative for chest pain.  Gastrointestinal: Negative for abdominal distention, abdominal pain, blood in stool, diarrhea and nausea.  Endocrine: Negative for hot flashes.  Genitourinary: Negative for difficulty urinating and frequency.   Musculoskeletal: Positive for back pain. Negative for arthralgias.  Skin: Negative for itching and rash.  Neurological: Negative for extremity weakness.  Hematological: Negative for adenopathy.  Psychiatric/Behavioral: Negative for confusion and depression. The patient is not nervous/anxious.        Forgetful     MEDICAL HISTORY:  Past Medical History:  Diagnosis Date  . Acute MI, inferoposterior wall ([place]) 09/30/2014  . Claustrophobia   . Coronary artery disease   . Hypercholesteremia   . Hypertension   . MI, old   . 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 [facility]) 10/27/2018    SURGICAL HISTORY: Past Surgical History:  Procedure Laterality Date  . APPENDECTOMY     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  . BLADDER NECK SUSPENSION    . CHOLECYSTECTOMY N/A 08/14/2018   Procedure: LAPAROSCOPIC CHOLECYSTECTOMY;  Surgeon: Jules Husbands, MD;  Location: [facility] ORS;  Service: General;  Laterality: N/A;  . CHOLECYSTECTOMY  11/2018  . CORONARY STENT PLACEMENT    . ENDOBRONCHIAL ULTRASOUND  N/A 10/21/2018   Procedure: ENDOBRONCHIAL ULTRASOUND;  Surgeon: Laverle Hobby, MD;  Location: [facility] ORS;   Service: Pulmonary;  Laterality: N/A;  . HERNIA REPAIR    . IR FLUORO GUIDE CV LINE RIGHT  12/19/2018  . LEFT HEART CATHETERIZATION WITH CORONARY ANGIOGRAM N/A 09/29/2014   Procedure: LEFT HEART CATHETERIZATION WITH CORONARY ANGIOGRAM;  Surgeon: Clent Demark, MD;  Location: [facility] CATH LAB;  Service: Cardiovascular;  Laterality: N/A;  . PORTA CATH INSERTION N/A 01/02/2019   Procedure: PORTA CATH INSERTION;  Surgeon: Algernon Huxley, MD;  Location: [place] CV LAB;  Service: Cardiovascular;  Laterality: N/A;  . PORTACATH PLACEMENT Right 10/28/2018   Procedure: INSERTION PORT-A-CATH;  Surgeon: Jules Husbands, MD;  Location: [facility] ORS;  Service: General;  Laterality: Right;    SOCIAL HISTORY: Social History   Socioeconomic History  . Marital status: Married    Spouse name: [name]   . Number of children: 3  . Years of education: Not on file  . Highest education level: Not on file  Occupational History  . Occupation: Retired    Comment: Chief Technology Officer   Tobacco Use  . Smoking status: Former Smoker    Packs/day: 1.00    Years: 39.00    Pack years: 39.00    Types: Cigarettes    Start date: 12/21/1978    Quit date: 10/16/2018    Years since quitting: 1.1  . Smokeless tobacco: Never Used  Substance and Sexual Activity  . Alcohol use: Yes    Alcohol/week: 0.0 standard drinks    Comment: occassional - approx 1 every 2 weeks   . Drug use: No  . Sexual activity: Yes    Birth control/protection: None  Other Topics Concern  . Not on file  Social History Narrative  . Not on file   Social Determinants of Health   Financial Resource Strain: Low Risk   . Difficulty of Paying Living Expenses: Not very hard  Food Insecurity:   . Worried About Charity fundraiser in the Last Year: Not on file  . Ran Out of Food in the Last Year: Not on file  Transportation Needs: No Transportation Needs  . Lack of Transportation (Medical): No  . Lack of Transportation (Non-Medical): No  Physical Activity:  Unknown  . Days of Exercise per Week: 0 days  . Minutes of Exercise per Session: Not on file  Stress: Stress Concern Present  . Feeling of Stress : To some extent  Social Connections: Unknown  . Frequency of Communication with Friends and Family: Three times a week  . Frequency of Social Gatherings with Friends and Family: Once a week  . Attends Religious Services: 1 to 4 times per year  . Active Member of Clubs or Organizations: Not on file  . Attends Archivist Meetings: Not on file  . Marital Status: Married  Human resources officer Violence: Not At Risk  . Fear of Current or Ex-Partner: No  . Emotionally Abused: No  . Physically Abused: No  . Sexually Abused: No    FAMILY HISTORY: Family History  Problem Relation Age of Onset  . Alzheimer's disease Mother   . Colon cancer Father   . Breast cancer Neg Hx     ALLERGIES:  has No Known Allergies.  MEDICATIONS:  Current Outpatient Medications  Medication Sig Dispense Refill  . Cholecalciferol (VITAMIN D) 50 MCG (2000 UT) CAPS Take 1 capsule by mouth daily.    . diphenhydrAMINE-zinc acetate (BENADRYL EXTRA STRENGTH) cream Apply  1 application topically 3 (three) times daily as needed for itching. 28.4 g 0  . fentaNYL (DURAGESIC) 25 MCG/HR Place 1 patch onto the skin every 3 (three) days. 10 patch 0  . HYDROcodone-acetaminophen (NORCO/VICODIN) 5-325 MG tablet Take 1 tablet by mouth daily as needed for moderate pain. 30 tablet 0  . lidocaine-prilocaine (EMLA) cream Apply to affected area once 30 g 3  . omeprazole (PRILOSEC) 40 MG capsule TAKE ONE CAPSULE BY MOUTH DAILY 90 capsule 1  . polyethylene glycol (MIRALAX / GLYCOLAX) packet Take 17 g by mouth daily as needed.     . sertraline (ZOLOFT) 50 MG tablet Take 1.5 tablets (75 mg total) by mouth daily. 45 tablet 1  . vitamin B-12 (CYANOCOBALAMIN) 1000 MCG tablet Take 1,000 mcg by mouth daily.    [name] Kitchen atorvastatin (LIPITOR) 80 MG tablet Take 0.5 tablets (40 mg total) by mouth daily  at 6 PM. (Patient not taking: Reported on 11/20/2019) 30 tablet 3  . CALCIUM PO Take 1 tablet by mouth daily.     [name] Kitchen dexamethasone (DECADRON) 4 MG tablet Take 1 tablet (4 mg total) by mouth daily. (Patient not taking: Reported on 12/11/2019) 25 tablet 0  . metoprolol tartrate (LOPRESSOR) 25 MG tablet Take 12.5 mg by mouth 2 (two) times daily.    . nitroGLYCERIN (NITROSTAT) 0.4 MG SL tablet Place 1 tablet (0.4 mg total) under the tongue every 5 (five) minutes x 3 doses as needed for chest pain. (Patient not taking: Reported on 11/20/2019) 25 tablet 12  . ondansetron (ZOFRAN) 8 MG tablet Take 1 tablet (8 mg total) by mouth 2 (two) times daily as needed for refractory nausea / vomiting. Start on day 3 after carboplatin chemo. (Patient not taking: Reported on 12/11/2019) 30 tablet 1  . predniSONE (DELTASONE) 20 MG tablet Take 1 tablet (20 mg total) by mouth daily with breakfast. (Patient not taking: Reported on 11/20/2019) 5 tablet 0  . prochlorperazine (COMPAZINE) 10 MG tablet TAKE 1 TABLET BY MOUTH EVERY 6 HOURS AS NEEDED FOR NAUSEA OR VOMITING (Patient not taking: Reported on 12/11/2019) 270 tablet 1  . triamterene-hydrochlorothiazide (DYAZIDE) 37.5-25 MG capsule Take 1 each (1 capsule total) by mouth daily as needed. (Patient not taking: Reported on 10/25/2019) 90 capsule 0   No current facility-administered medications for this visit.   Facility-Administered Medications Ordered in Other Visits  Medication Dose Route Frequency Provider Last Rate Last Admin  . heparin lock flush 100 unit/mL  500 Units Intravenous Once Earlie Server, MD      . heparin lock flush 100 unit/mL  500 Units Intravenous Once Earlie Server, MD      . sodium chloride flush (NS) 0.9 % injection 10 mL  10 mL Intravenous PRN Earlie Server, MD   10 mL at 01/09/19 0820  . sodium chloride flush (NS) 0.9 % injection 10 mL  10 mL Intravenous PRN Earlie Server, MD   10 mL at 01/10/19 1400     PHYSICAL EXAMINATION: ECOG PERFORMANCE STATUS: 1 -  Symptomatic but completely ambulatory Vitals:   12/11/19 0850  BP: 119/68  Pulse: 84  Resp: 16  Temp: 97.7 F (36.5 C)  SpO2: 99%   Filed Weights   12/11/19 0850  Weight: 148 lb 1.6 oz (67.2 kg)   Physical Exam  Constitutional: She is oriented to person, place, and time. No distress.  HENT:  Head: Normocephalic and atraumatic.  Nose: Nose normal.  Mouth/Throat: Oropharynx is clear and moist. No oropharyngeal exudate.  Eyes: Pupils are  equal, round, and reactive to light. EOM are normal. No scleral icterus.  Cardiovascular: Normal rate and regular rhythm.  No murmur heard. Pulmonary/Chest: Effort normal. No respiratory distress. She has no rales. She exhibits no tenderness.  Decreased breath sounds bilaterally, right side breath sound is more diminished.   Abdominal: Soft. She exhibits no distension. There is no abdominal tenderness.  Musculoskeletal:        General: No edema. Normal range of motion.     Cervical back: Normal range of motion and neck supple.  Neurological: She is alert and oriented to person, place, and time. No cranial nerve deficit. She exhibits normal muscle tone. Coordination normal.  Skin: Skin is warm and dry. She is not diaphoretic. No erythema.  Psychiatric: Affect normal.       LABORATORY DATA:  I have reviewed the data as listed Lab Results  Component Value Date   WBC 5.1 12/11/2019   HGB 11.6 (L) 12/11/2019   HCT 36.5 12/11/2019   MCV 90.1 12/11/2019   PLT 162 12/11/2019   Recent Labs    10/26/19 0908 11/20/19 0932 12/11/19 0821  NA 133* 132* 139  K 3.9 4.0 3.7  CL 101 102 105  CO2 22 22 25   GLUCOSE 113* 106* 98  BUN 11 13 13   CREATININE 0.75 0.65 0.79  CALCIUM 9.3 8.8* 9.2  GFRNONAA >60 >60 >60  GFRAA >60 >60 >60  PROT 7.2 7.4 6.9  ALBUMIN 4.0 4.0 3.9  AST 14* 14* 14*  ALT 12 12 12   ALKPHOS 87 88 65  BILITOT 0.9 0.7 0.6    RADIOGRAPHIC STUDIES: I have personally reviewed the radiological images as listed and agreed with  the findings in the report. 10/20/2018 CT chest w contrast . Large mediastinal mass involving both hila left much greater than right consistent with lung carcinoma possibly small cell lung carcinoma. This mass causes narrowing of the left mainstem bronchus with resultant volume loss on the left and mediastinal shift to the Left. 2. Moderate size left pleural effusion. 3. Coronary artery calcifications and moderate thoracic aortic atherosclerosis. PET 10/26/2018 . Large left lung mass is markedly hypermetabolic in the confluent lymphadenopathy in the mediastinum and in bilateral hilar regions also shows marked hypermetabolism. 2. Hypermetabolic metastatic lymphadenopathy in the right neck and supraclavicular region. 3. Hypermetabolic lymphadenopathy in the hepato duodenal ligament consistent with metastatic disease. 4. Large hypermetabolic lesion posterior right acetabulum without correlate of CT finding. Features highly suspicious for bony metastatic disease. MRI brain 10/28/2018 1. No metastatic disease or acute intracranial abnormality identified. 2. 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  3. Moderately advanced but nonspecific cerebral white matter signal changes, most commonly due to chronic small vessel disease. Lesser signal changes in the deep gray matter and pons may also be small vessel related.  CT chest abdomen pelvis with contrast 12/15/2018 1 Marked response to therapy of thoracic adenopathy. 2. No new or progressive disease in the chest. 3. Coronary artery atherosclerosis. 4. 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 5. No new or progressive disease in the abdomen or pelvis 6. Pelvic floor laxity, cystocele with bladder wall thickening, suggesting a component of outlet obstruction. 7. Possible vague sclerosis within the posterior right acetabulum, in the region of hypermetabolism on prior PET. Likely an area of treated metastasis. 8. Central line tip in right  brachiocephalic vein with surrounding occlusive thrombus. CT chest abdomen pelvis with contrast 01/24/2019 1. New extensive gas throughout the bladder wall and within the bladder  lumen with underlying mild diffuse bladder wall thickening, compatible with acute emphysematous cystitis. 2. Two new subcentimeter low-attenuation lesions in the right kidney, nonspecific but worrisome for mild acute right pyelonephritis given the above bladder findings. No discrete/drainable renal abscess. No hydronephrosis. 3. Continued positive response to therapy with continued reduction in mediastinal adenopathy. No residual measurable left lung mass.Stable faint sclerosis at the site of the hypermetabolic right acetabular osseous lesion. No new or progressive metastatic disease in the chest, abdomen or pelvis. 4. Small bowel containing small periumbilical hernia without bowel complication at this time. 5. Aortic Atherosclerosis (ICD10-I70.0) and Emphysema (ICD10-J43.9).  05/05/2019 CT chest abdomen pelvis with contrast Near complete resolution of prior mediastinal lymphadenopathy.  Residual 12 mm short axis subcarinal node. Radiation changes in the lungs bilaterally.  Left lower lobe/lingular scarring.  No suspicious pulmonary nodules.  No evidence of metastatic disease in the abdomen or pelvis.  CT Chest W Contrast  Result Date: 10/23/2019 CLINICAL DATA:  Extensive stage small cell left lung cancer diagnosed October 2019 status post chemotherapy and chest and brain radiation therapy. Ongoing chemotherapy. Restaging. Patient reports productive cough and right upper quadrant pain. EXAM: CT CHEST, ABDOMEN, AND PELVIS WITH CONTRAST TECHNIQUE: Multidetector CT imaging of the chest, abdomen and pelvis was performed following the standard protocol during bolus administration of intravenous contrast. CONTRAST:  169mL OMNIPAQUE IOHEXOL 300 MG/ML  SOLN COMPARISON:  07/27/2019 CT chest, abdomen and pelvis. FINDINGS: CT CHEST  FINDINGS Cardiovascular: Normal heart size. No significant pericardial effusion/thickening. Three-vessel coronary atherosclerosis. Right internal jugular Port-A-Cath terminates at the cavoatrial junction. Atherosclerotic nonaneurysmal thoracic aorta. Normal caliber pulmonary arteries. No central pulmonary emboli. Mediastinum/Nodes: No discrete thyroid nodules. Unremarkable esophagus. No pathologically enlarged axillary, mediastinal or hilar lymph nodes. Lungs/Pleura: No pneumothorax. No pleural effusion. Mild centrilobular and paraseptal emphysema. No acute consolidative airspace disease, lung masses or significant pulmonary nodules. Stable calcified subcentimeter right pulmonary granulomas. Musculoskeletal: No aggressive appearing focal osseous lesions. New mild superior T7 vertebral compression deformity. Mild thoracic spondylosis. CT ABDOMEN PELVIS FINDINGS Hepatobiliary: Normal liver with no liver mass. Cholecystectomy. No biliary ductal dilatation. Pancreas: Normal, with no mass or duct dilation. Spleen: Normal size. No mass. Adrenals/Urinary Tract: Normal adrenals. Normal kidneys with no hydronephrosis and no renal mass. Small cystocele. Otherwise normal bladder. Stomach/Bowel: Normal non-distended stomach. Stable small to moderate paraumbilical hernia containing multiple small bowel loops. No small bowel dilatation, focal caliber transition, wall thickening or pneumatosis. Oral contrast transits to the cecum. Appendectomy. Mild diffuse colonic diverticulosis, with no large bowel wall thickening or acute pericolonic fat stranding. Vascular/Lymphatic: Atherosclerotic nonaneurysmal abdominal aorta. Patent portal, splenic, hepatic and renal veins. No pathologically enlarged lymph nodes in the abdomen or pelvis. Reproductive: Grossly normal uterus.  No adnexal mass. Other: No pneumoperitoneum, ascites or focal fluid collection. Musculoskeletal: No aggressive appearing focal osseous lesions. The hypermetabolic  right acetabular lesion on 10/26/2018 PET-CT study is not evident on today's CT scan. Mild lumbar spondylosis. IMPRESSION: 1. No findings of metastatic disease in the chest, abdomen or pelvis. No evidence of recurrent pulmonary neoplasm. 2. New mild superior T7 vertebral compression fracture without evidence of underlying pathologic osseous lesion. 3. Stable small to moderate paraumbilical ventral abdominal hernia containing small bowel loops, without acute bowel complication. 4. Aortic Atherosclerosis (ICD10-I70.0) and Emphysema (ICD10-J43.9). Electronically Signed   By: Ilona Sorrel M.D.   On: 10/23/2019 10:29   MR Brain W Wo Contrast  Result Date: 12/01/2019 CLINICAL DATA:  73 year old female with history of small cell lung cancer. Status post prophylactic  cranial radiation completed in May or June this year. Restaging. EXAM: MRI HEAD WITHOUT AND WITH CONTRAST TECHNIQUE: Multiplanar, multiecho pulse sequences of the brain and surrounding structures were obtained without and with intravenous contrast. CONTRAST:  43mL GADAVIST GADOBUTROL 1 MMOL/ML IV SOLN COMPARISON:  Brain MRI 10/28/2018. FINDINGS: Brain: No restricted diffusion to suggest acute infarction. No midline shift, mass effect, evidence of mass lesion, ventriculomegaly, extra-axial collection or acute intracranial hemorrhage. Cervicomedullary junction and pituitary are within normal limits. No abnormal enhancement identified. No dural thickening. Progressed and now widespread bilateral cerebral white matter T2 and FLAIR hyperintensity, involving the deep white matter capsules also. Cerebral volume appears stable. Similar patchy and confluent new or increased T2 signal in the pons and deep cerebellar nuclei. No cortical encephalomalacia or chronic cerebral blood products identified. Vascular: Major intracranial vascular flow voids are stable, with loss of the distal left vertebral artery flow void as noted in 2019. The major dural venous sinuses are  enhancing and appear to be patent. Skull and upper cervical spine: Negative visible cervical spine aside from degenerative changes. Negative visible spinal cord. Visualized bone marrow signal is within normal limits. Sinuses/Orbits: Stable and negative. Other: Trace left mastoid fluid is stable. Visible internal auditory structures appear normal. Scalp and face soft tissues appear negative. IMPRESSION: Sequelae of whole brain radiation. No metastatic disease or acute intracranial abnormality. Electronically Signed   By: Genevie Ann M.D.   On: 12/01/2019 11:45   MR Thoracic Spine W Wo Contrast  Result Date: 12/01/2019 CLINICAL DATA:  Upper back pain for approximately 3 weeks. The patient has suffered several falls. EXAM: MRI THORACIC WITHOUT AND WITH CONTRAST TECHNIQUE: Multiplanar and multiecho pulse sequences of the thoracic spine were obtained without and with intravenous contrast. CONTRAST:  6 mL GADAVIST IV SOLN COMPARISON:  CT chest, abdomen and pelvis 07/27/2019 and 10/23/2019. FINDINGS: MRI THORACIC SPINE FINDINGS Alignment:  Maintained.  Exaggeration of thoracic kyphosis is noted. Vertebrae: The patient has a severe compression fracture of T7 with vertebra plana deformity identified. There is marrow edema and enhancement in the vertebral body. Mild marrow edema in the T7 pedicles is greater on the right. There was a mild superior endplate compression fracture at T7 on the 10/23/2019 CT and T7 was intact on the 07/27/2019 CT. Minimal marrow edema is seen in the anterior aspect of the inferior endplate of T6. Cord:  Normal signal throughout. Paraspinal and other soft tissues: Negative. Disc levels: T1-2 through T5-6 are negative. T6-7: Mild bony retropulsion is seen off the posterior margin of T7 but there is no stenosis. No epidural hematoma is identified. T8-9: Shallow left paracentral protrusion indents the ventral thecal sac but the central canal and foramina are open. T9-10: Shallow right paracentral  protrusion without stenosis. T10-11: Minimal disc bulge without stenosis. T11-12: Minimal disc bulge without stenosis. T12-L1: Negative. 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 There is mild bony retropulsion off the posterior margin of T7 but no stenosis. Small focus of marrow edema in the anterior, inferior endplate of T6 may be due to contusion without fracture. 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 Electronically Signed   By: Inge Rise M.D.   On: 12/01/2019 11:29   CT Abdomen Pelvis W Contrast  Result Date: 10/23/2019 CLINICAL DATA:  Extensive stage small cell left  lung cancer diagnosed October 2019 status post chemotherapy and chest and brain radiation therapy. Ongoing chemotherapy. Restaging. Patient reports productive cough and right upper quadrant pain. EXAM: CT CHEST, ABDOMEN, AND PELVIS WITH CONTRAST TECHNIQUE: Multidetector CT imaging of the chest, abdomen and pelvis was performed following the standard protocol during bolus administration of intravenous contrast. CONTRAST:  135mL OMNIPAQUE IOHEXOL 300 MG/ML  SOLN COMPARISON:  07/27/2019 CT chest, abdomen and pelvis. FINDINGS: CT CHEST FINDINGS Cardiovascular: Normal heart size. No significant pericardial effusion/thickening. Three-vessel coronary atherosclerosis. Right internal jugular Port-A-Cath terminates at the cavoatrial junction. Atherosclerotic nonaneurysmal thoracic aorta. Normal caliber pulmonary arteries. No central pulmonary emboli. Mediastinum/Nodes: No discrete thyroid nodules. Unremarkable esophagus. No pathologically enlarged axillary, mediastinal or hilar lymph nodes. Lungs/Pleura: No pneumothorax. No pleural effusion. Mild centrilobular and paraseptal emphysema. No  acute consolidative airspace disease, lung masses or significant pulmonary nodules. Stable calcified subcentimeter right pulmonary granulomas. Musculoskeletal: No aggressive appearing focal osseous lesions. New mild superior T7 vertebral compression deformity. Mild thoracic spondylosis. CT ABDOMEN PELVIS FINDINGS Hepatobiliary: Normal liver with no liver mass. Cholecystectomy. No biliary ductal dilatation. Pancreas: Normal, with no mass or duct dilation. Spleen: Normal size. No mass. Adrenals/Urinary Tract: Normal adrenals. Normal kidneys with no hydronephrosis and no renal mass. Small cystocele. Otherwise normal bladder. Stomach/Bowel: Normal non-distended stomach. Stable small to moderate paraumbilical hernia containing multiple small bowel loops. No small bowel dilatation, focal caliber transition, wall thickening or pneumatosis. Oral contrast transits to the cecum. Appendectomy. Mild diffuse colonic diverticulosis, with no large bowel wall thickening or acute pericolonic fat stranding. Vascular/Lymphatic: Atherosclerotic nonaneurysmal abdominal aorta. Patent portal, splenic, hepatic and renal veins. No pathologically enlarged lymph nodes in the abdomen or pelvis. Reproductive: Grossly normal uterus.  No adnexal mass. Other: No pneumoperitoneum, ascites or focal fluid collection. Musculoskeletal: No aggressive appearing focal osseous lesions. The hypermetabolic right acetabular lesion on 10/26/2018 PET-CT study is not evident on today's CT scan. Mild lumbar spondylosis. IMPRESSION: 1. No findings of metastatic disease in the chest, abdomen or pelvis. No evidence of recurrent pulmonary neoplasm. 2. New mild superior T7 vertebral compression fracture without evidence of underlying pathologic osseous lesion. 3. Stable small to moderate paraumbilical ventral abdominal hernia containing small bowel loops, without acute bowel complication. 4. Aortic Atherosclerosis (ICD10-I70.0) and Emphysema (ICD10-J43.9).  Electronically Signed   By: Ilona Sorrel M.D.   On: 10/23/2019 10:29     ASSESSMENT & PLAN:  1. Small cell lung cancer ([place])   2. Encounter for antineoplastic immunotherapy   3. Memory loss   4. Thoracic compression fracture, closed, initial encounter ([place])   5. Osteopenia, unspecified location    #Extensive small cell lung cancer S/p 4 cycles of Carboplatin, Etoposide and Tecentriq.   Status post chest and whole brain radiation.  Labs are reviewed and discussed with patient. Her counts are acceptable to proceed with Tecentriq maintenance today. MRI brain negative for recurrence.  #T7 vertebral compression fracture without evidence of underlying pathological osseous lesions. MRI thoracic spine images were independently reviewed by me and discussed with patient. Consistent with acute/subacute compression fracture of T7 with near vertebra plana deformity, most consistent with a senile osteoporotic or posttraumatic injury.  Degenerative changes Continue fentanyl patch with Norco as needed for pain control.   #Osteopenia, recommend patient to continue calcium and vitamin D supplementation.  Proceed with Zometa monthly.  We will proceed today #Forgetfulness, likely secondary to whole brain radiation.  Discussed with patient about coping skills. I suggest patient to bring a family member to her next visit.  Depression has been well controlled.  Continue Zoloft... Follow-up with palliative care. Follow up in 3 weeks for eval with next cycle of tecentriq.    Earlie Server, MD, PhD

## 2019-12-11 NOTE — Progress Notes (Signed)
Patient has back pain that is 4-5/10 on pain scale today.  She sometimes has what feels like a "bubble" in her stomach.

## 2019-12-29 ENCOUNTER — Other Ambulatory Visit: Payer: Self-pay

## 2019-12-29 ENCOUNTER — Other Ambulatory Visit: Payer: Self-pay | Admitting: *Deleted

## 2019-12-29 MED ORDER — FENTANYL 25 MCG/HR TD PT72: 1.0000 | MEDICATED_PATCH | TRANSDERMAL | 0 refills | Status: DC

## 2019-12-29 MED ORDER — HYDROCODONE-ACETAMINOPHEN 5-325 MG PO TABS: 1.0000 | ORAL_TABLET | Freq: Every day | ORAL | 0 refills | Status: DC | PRN

## 2020-01-01 ENCOUNTER — Other Ambulatory Visit: Payer: Self-pay

## 2020-01-01 ENCOUNTER — Encounter: Payer: Self-pay | Admitting: Oncology

## 2020-01-01 ENCOUNTER — Inpatient Hospital Stay (HOSPITAL_BASED_OUTPATIENT_CLINIC_OR_DEPARTMENT_OTHER): Payer: Medicare Other | Admitting: Hospice and Palliative Medicine

## 2020-01-01 ENCOUNTER — Inpatient Hospital Stay: Payer: Medicare Other

## 2020-01-01 ENCOUNTER — Inpatient Hospital Stay (HOSPITAL_BASED_OUTPATIENT_CLINIC_OR_DEPARTMENT_OTHER): Payer: Medicare Other | Admitting: Oncology

## 2020-01-01 ENCOUNTER — Inpatient Hospital Stay: Payer: Medicare Other | Attending: Oncology

## 2020-01-01 VITALS — BP 122/87 | HR 76 | Temp 97.4°F | Resp 18 | Wt 149.6 lb

## 2020-01-01 DIAGNOSIS — Z515 Encounter for palliative care: Secondary | ICD-10-CM

## 2020-01-01 DIAGNOSIS — C7951 Secondary malignant neoplasm of bone: Secondary | ICD-10-CM | POA: Insufficient documentation

## 2020-01-01 DIAGNOSIS — Z5112 Encounter for antineoplastic immunotherapy: Secondary | ICD-10-CM | POA: Insufficient documentation

## 2020-01-01 DIAGNOSIS — F329 Major depressive disorder, single episode, unspecified: Secondary | ICD-10-CM

## 2020-01-01 DIAGNOSIS — C3490 Malignant neoplasm of unspecified part of unspecified bronchus or lung: Secondary | ICD-10-CM

## 2020-01-01 DIAGNOSIS — R413 Other amnesia: Secondary | ICD-10-CM

## 2020-01-01 DIAGNOSIS — F32A Depression, unspecified: Secondary | ICD-10-CM

## 2020-01-01 DIAGNOSIS — C3492 Malignant neoplasm of unspecified part of left bronchus or lung: Secondary | ICD-10-CM | POA: Diagnosis not present

## 2020-01-01 DIAGNOSIS — S22000A Wedge compression fracture of unspecified thoracic vertebra, initial encounter for closed fracture: Secondary | ICD-10-CM

## 2020-01-01 DIAGNOSIS — Z95828 Presence of other vascular implants and grafts: Secondary | ICD-10-CM

## 2020-01-01 LAB — COMPREHENSIVE METABOLIC PANEL
ALT: 12 U/L (ref 0–44)
AST: 15 U/L (ref 15–41)
Albumin: 4.1 g/dL (ref 3.5–5.0)
Alkaline Phosphatase: 63 U/L (ref 38–126)
Anion gap: 10 (ref 5–15)
BUN: 18 mg/dL (ref 8–23)
CO2: 25 mmol/L (ref 22–32)
Calcium: 9.6 mg/dL (ref 8.9–10.3)
Chloride: 103 mmol/L (ref 98–111)
Creatinine, Ser: 0.75 mg/dL (ref 0.44–1.00)
GFR calc Af Amer: >60 mL/min (ref >60)
GFR calc non Af Amer: >60 mL/min (ref >60)
Glucose, Bld: 107 mg/dL — ABNORMAL HIGH (ref 70–99)
Potassium: 3.9 mmol/L (ref 3.5–5.1)
Sodium: 138 mmol/L (ref 135–145)
Total Bilirubin: 0.7 mg/dL (ref 0.3–1.2)
Total Protein: 7.3 g/dL (ref 6.5–8.1)

## 2020-01-01 LAB — CBC WITH DIFFERENTIAL/PLATELET
Abs Immature Granulocytes: 0.02 10*3/uL (ref 0.00–0.07)
Basophils Absolute: 0 10*3/uL (ref 0.0–0.1)
Basophils Relative: 1 %
Eosinophils Absolute: 0.3 10*3/uL (ref 0.0–0.5)
Eosinophils Relative: 5 %
HCT: 37 % (ref 36.0–46.0)
Hemoglobin: 12.1 g/dL (ref 12.0–15.0)
Immature Granulocytes: 0 %
Lymphocytes Relative: 21 %
Lymphs Abs: 1.3 10*3/uL (ref 0.7–4.0)
MCH: 29.2 pg (ref 26.0–34.0)
MCHC: 32.7 g/dL (ref 30.0–36.0)
MCV: 89.2 fL (ref 80.0–100.0)
Monocytes Absolute: 1 10*3/uL (ref 0.1–1.0)
Monocytes Relative: 16 %
Neutro Abs: 3.4 10*3/uL (ref 1.7–7.7)
Neutrophils Relative %: 57 %
Platelets: 162 10*3/uL (ref 150–400)
RBC: 4.15 MIL/uL (ref 3.87–5.11)
RDW: 13.1 % (ref 11.5–15.5)
WBC: 6.1 10*3/uL (ref 4.0–10.5)
nRBC: 0 % (ref 0.0–0.2)

## 2020-01-01 MED ORDER — SODIUM CHLORIDE 0.9 % IV SOLN
Freq: Once | INTRAVENOUS | Status: AC
  Filled 2020-01-01: qty 250

## 2020-01-01 MED ORDER — SODIUM CHLORIDE 0.9 % IV SOLN
1200.0000 mg | Freq: Once | INTRAVENOUS | Status: AC
  Administered 2020-01-01: 1200 mg via INTRAVENOUS
  Filled 2020-01-01: qty 20

## 2020-01-01 MED ORDER — SODIUM CHLORIDE 0.9% FLUSH
10.0000 mL | INTRAVENOUS | Status: DC | PRN
  Administered 2020-01-01: 10 mL via INTRAVENOUS
  Filled 2020-01-01: qty 10

## 2020-01-01 MED ORDER — HEPARIN SOD (PORK) LOCK FLUSH 100 UNIT/ML IV SOLN
INTRAVENOUS | Status: AC
  Filled 2020-01-01: qty 5

## 2020-01-01 MED ORDER — DEXAMETHASONE SODIUM PHOSPHATE 10 MG/ML IJ SOLN
10.0000 mg | Freq: Once | INTRAMUSCULAR | Status: AC
  Administered 2020-01-01: 10 mg via INTRAVENOUS
  Filled 2020-01-01: qty 1

## 2020-01-01 MED ORDER — HEPARIN SOD (PORK) LOCK FLUSH 100 UNIT/ML IV SOLN
500.0000 [IU] | Freq: Once | INTRAVENOUS | Status: AC
  Administered 2020-01-01: 12:00:00 500 [IU] via INTRAVENOUS
  Filled 2020-01-01: qty 5

## 2020-01-01 MED ORDER — SERTRALINE HCL 50 MG PO TABS: 75.0000 mg | ORAL_TABLET | Freq: Every day | ORAL | 1 refills | Status: DC

## 2020-01-01 NOTE — Progress Notes (Signed)
Pt here for follow up. No new concerns.

## 2020-01-01 NOTE — Progress Notes (Signed)
[facility]  [phone] [phone]   Name: Kristi [name] [facility] Date: 01/01/2020 MRN: [id number]  DOB: [birth year]  Patient Care Team: Glean Hess, MD as PCP - General (Internal Medicine) Charolette Forward, MD as Consulting Physician (Cardiology) Telford Nab, RN as Registered Nurse    REASON FOR CONSULTATION: Palliative Care consult requested for this 74 y.o. female with multiple medical problems including stage IV small cell lung cancer.  PMH also notable for CAD with history of acute MI, claustrophobia, hypertension, and hyperlipidemia.  Patient was recently seen and examined by pulmonology due to worsening exertional dyspnea.  She was found to have a central obstructing mass and mediastinal adenopathy on CT.  Patient is status post bronchoscopy with biopsy confirming small cell lung cancer.  PET CT scan revealed hypermetabolic areas involving lymphadenopathy in the neck and supraclavicular region, hepatoduodenal ligament, and disease of the right acetabulum.  Patient is pending s/p 4 cycles chemotherapy with Atezolizumab, Carboplatin, and Etoposide.    She is also status post RT to the chest and whole brain.  Patient is now on maintenance  immunotherapy.  Palliative care was asked to help address goals and symptoms.   SOCIAL HISTORY:    Patient is married and lives at home with her husband of 70 years.  They moved here from [place], [place] around 4 years ago.  Patient has 2 sons and a daughter.  One son lives in [place] and the other 2 children are in [place].  Patient used to work for a [employer] and then later for the local [employer].  ADVANCE DIRECTIVES:  Not on file  CODE STATUS: DNR (MOST form completed on 11/24/18)  PAST MEDICAL HISTORY: Past Medical History:  Diagnosis Date  . Acute MI, inferoposterior wall ([place]) 09/30/2014  . Claustrophobia   . Coronary artery disease   .  Hypercholesteremia   . Hypertension   . MI, old   . 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 [facility]) 10/27/2018    PAST SURGICAL HISTORY:  Past Surgical History:  Procedure Laterality Date  . APPENDECTOMY     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  . BLADDER NECK SUSPENSION    . CHOLECYSTECTOMY N/A 08/14/2018   Procedure: LAPAROSCOPIC CHOLECYSTECTOMY;  Surgeon: Jules Husbands, MD;  Location: [facility] ORS;  Service: General;  Laterality: N/A;  . CHOLECYSTECTOMY  11/2018  . CORONARY STENT PLACEMENT    . ENDOBRONCHIAL ULTRASOUND N/A 10/21/2018   Procedure: ENDOBRONCHIAL ULTRASOUND;  Surgeon: Laverle Hobby, MD;  Location: [facility] ORS;  Service: Pulmonary;  Laterality: N/A;  . HERNIA REPAIR    . IR FLUORO GUIDE CV LINE RIGHT  12/19/2018  . LEFT HEART CATHETERIZATION WITH CORONARY ANGIOGRAM N/A 09/29/2014   Procedure: LEFT HEART CATHETERIZATION WITH CORONARY ANGIOGRAM;  Surgeon: Clent Demark, MD;  Location: [facility] CATH LAB;  Service: Cardiovascular;  Laterality: N/A;  . PORTA CATH INSERTION N/A 01/02/2019   Procedure: PORTA CATH INSERTION;  Surgeon: Algernon Huxley, MD;  Location: [facility] CV LAB;  Service: Cardiovascular;  Laterality: N/A;  . PORTACATH PLACEMENT Right 10/28/2018   Procedure: INSERTION PORT-A-CATH;  Surgeon: Jules Husbands, MD;  Location: [facility] ORS;  Service: General;  Laterality: Right;    HEMATOLOGY/ONCOLOGY HISTORY:  Oncology History Overview Note  Kristi [name] is a  74 y.o.  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   10/20/2018 CT chest with contrast showed large  mediastinal mass involving both hilar, left greater than right, consistent with lung carcinoma, The mass causes narrowing of the left mainstem bronchus with resultant volume loss on the left and a mediastinal shift to the left.  Moderate size left pleural effusion. Patient underwent E bus bronchoscopy 10/21/2018 Left mainstem bronchus transbronchial forcep biopsy showed small cell  carcinoma.   # Nov 2019- Jan 2020 s/p 4 cycles of Carbo/Etoposide/Tecentriq #  01/24/2019 interim CT scan done which showed continued positive response to therapy with continued reduction in mediastinal adenopathy.  No residual measurable left lung mass. CT findings of acute emphysematous cystitis. Urology did not feel that patient has pyelonephritis and recommend treatment with antibiotics because patient's immunocompromise. Patient finished treatment.    # Chest and whole brain radiation finished in May 2020 # 04/25/2019 resume Tecentriq every 3 weeks.   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 [facility])  10/27/2018 Initial Diagnosis   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 [facility])   11/02/2018 -  Chemotherapy   The patient had palonosetron (ALOXI) injection 0.25 mg, 0.25 mg, Intravenous,  Once, 4 of 4 cycles Administration: 0.25 mg (11/02/2018), 0.25 mg (11/23/2018), 0.25 mg (12/19/2018), 0.25 mg (01/09/2019) pegfilgrastim-cbqv (UDENYCA) injection 6 mg, 6 mg, Subcutaneous, Once, 4 of 4 cycles Administration: 6 mg (11/07/2018), 6 mg (11/28/2018), 6 mg (12/23/2018), 6 mg (01/12/2019) CARBOplatin (PARAPLATIN) 390 mg in sodium chloride 0.9 % 250 mL chemo infusion, 390 mg (100 % of original dose 394.5 mg), Intravenous,  Once, 4 of 4 cycles Dose modification:   (original dose 394.5 mg, Cycle 1) Administration: 390 mg (11/02/2018), 390 mg (11/23/2018), 390 mg (12/19/2018), 390 mg (01/09/2019) etoposide (VEPESID) 180 mg in sodium chloride 0.9 % 500 mL chemo infusion, 100 mg/m2 = 180 mg, Intravenous,  Once, 4 of 4 cycles Administration: 180 mg (11/02/2018), 180 mg (11/03/2018), 180 mg (11/04/2018), 180 mg (11/23/2018), 180 mg (11/24/2018), 180 mg (11/25/2018), 180 mg (12/19/2018), 180 mg (12/20/2018), 180 mg (12/22/2018), 180 mg (01/09/2019), 180 mg (01/10/2019), 180 mg (01/11/2019) fosaprepitant (EMEND) 150 mg, dexamethasone (DECADRON) 12 mg in sodium chloride 0.9 % 145 mL IVPB, , Intravenous,  Once, 4 of 4 cycles Administration:   (11/02/2018),  (11/23/2018),  (12/19/2018),  (01/09/2019) atezolizumab (TECENTRIQ) 1,200 mg in sodium chloride 0.9 % 250 mL chemo infusion, 1,200 mg, Intravenous, Once, 15 of 15 cycles Administration: 1,200 mg (11/23/2018), 1,200 mg (12/19/2018), 1,200 mg (01/09/2019), 1,200 mg (04/24/2019), 1,200 mg (05/18/2019), 1,200 mg (06/15/2019), 1,200 mg (07/13/2019), 1,200 mg (08/03/2019), 1,200 mg (08/24/2019), 1,200 mg (09/14/2019), 1,200 mg (10/05/2019), 1,200 mg (10/26/2019), 1,200 mg (01/01/2020), 1,200 mg (12/11/2019), 1,200 mg (11/20/2019)  for chemotherapy treatment.      ALLERGIES:  has No Known Allergies.  MEDICATIONS:  Current Outpatient Medications  Medication Sig Dispense Refill  . atorvastatin (LIPITOR) 80 MG tablet Take 0.5 tablets (40 mg total) by mouth daily at 6 PM. (Patient not taking: Reported on 11/20/2019) 30 tablet 3  . CALCIUM PO Take 1 tablet by mouth daily.     . Cholecalciferol (VITAMIN D) 50 MCG (2000 UT) CAPS Take 1 capsule by mouth daily.    [name] Kitchen dexamethasone (DECADRON) 4 MG tablet Take 1 tablet (4 mg total) by mouth daily. (Patient not taking: Reported on 12/11/2019) 25 tablet 0  . diphenhydrAMINE-zinc acetate (BENADRYL EXTRA STRENGTH) cream Apply 1 application topically 3 (three) times daily as needed for itching. (Patient not taking: Reported on 01/01/2020) 28.4 g 0  . fentaNYL (DURAGESIC) 25 MCG/HR Place 1 patch onto the skin every 3 (three) days. 10  patch 0  . HYDROcodone-acetaminophen (NORCO/VICODIN) 5-325 MG tablet Take 1 tablet by mouth daily as needed for moderate pain. 30 tablet 0  . lidocaine-prilocaine (EMLA) cream Apply to affected area once 30 g 3  . metoprolol tartrate (LOPRESSOR) 25 MG tablet Take 12.5 mg by mouth 2 (two) times daily.    . nitroGLYCERIN (NITROSTAT) 0.4 MG SL tablet Place 1 tablet (0.4 mg total) under the tongue every 5 (five) minutes x 3 doses as needed for chest pain. (Patient not taking: Reported on 11/20/2019) 25 tablet 12  . omeprazole (PRILOSEC) 40 MG  capsule TAKE ONE CAPSULE BY MOUTH DAILY 90 capsule 1  . ondansetron (ZOFRAN) 8 MG tablet Take 1 tablet (8 mg total) by mouth 2 (two) times daily as needed for refractory nausea / vomiting. Start on day 3 after carboplatin chemo. 30 tablet 1  . polyethylene glycol (MIRALAX / GLYCOLAX) packet Take 17 g by mouth daily as needed.     . predniSONE (DELTASONE) 20 MG tablet Take 1 tablet (20 mg total) by mouth daily with breakfast. (Patient not taking: Reported on 11/20/2019) 5 tablet 0  . prochlorperazine (COMPAZINE) 10 MG tablet TAKE 1 TABLET BY MOUTH EVERY 6 HOURS AS NEEDED FOR NAUSEA OR VOMITING 270 tablet 1  . sertraline (ZOLOFT) 50 MG tablet Take 1.5 tablets (75 mg total) by mouth daily. 45 tablet 1  . triamterene-hydrochlorothiazide (DYAZIDE) 37.5-25 MG capsule Take 1 each (1 capsule total) by mouth daily as needed. (Patient not taking: Reported on 10/25/2019) 90 capsule 0  . vitamin B-12 (CYANOCOBALAMIN) 1000 MCG tablet Take 1,000 mcg by mouth daily.     No current facility-administered medications for this visit.   Facility-Administered Medications Ordered in Other Visits  Medication Dose Route Frequency Provider Last Rate Last Admin  . heparin lock flush 100 unit/mL  500 Units Intravenous Once Earlie Server, MD      . heparin lock flush 100 unit/mL  500 Units Intravenous Once Earlie Server, MD      . sodium chloride flush (NS) 0.9 % injection 10 mL  10 mL Intravenous PRN Earlie Server, MD   10 mL at 01/09/19 0820  . sodium chloride flush (NS) 0.9 % injection 10 mL  10 mL Intravenous PRN Earlie Server, MD   10 mL at 01/10/19 1400    VITAL SIGNS: There were no vitals taken for this visit. There were no vitals filed for this visit.  Estimated body mass index is 23.43 kg/m as calculated from the following:   Height as of 07/06/19: 5' 7"  (1.702 m).   Weight as of an earlier encounter on 01/01/20: 149 lb 9.6 oz (67.9 kg).  LABS: CBC:    Component Value Date/Time   WBC 6.1 01/01/2020 0856   HGB 12.1 01/01/2020  0856   HGB 14.5 09/19/2018 1038   HCT 37.0 01/01/2020 0856   HCT 42.7 09/19/2018 1038   PLT 162 01/01/2020 0856   PLT 265 09/19/2018 1038   MCV 89.2 01/01/2020 0856   MCV 91 09/19/2018 1038   NEUTROABS 3.4 01/01/2020 0856   NEUTROABS 5.1 09/19/2018 1038   LYMPHSABS 1.3 01/01/2020 0856   LYMPHSABS 2.0 09/19/2018 1038   MONOABS 1.0 01/01/2020 0856   EOSABS 0.3 01/01/2020 0856   EOSABS 0.3 09/19/2018 1038   BASOSABS 0.0 01/01/2020 0856   BASOSABS 0.1 09/19/2018 1038   Comprehensive Metabolic Panel:    Component Value Date/Time   NA 138 01/01/2020 0856   NA 136 09/19/2018 1038   K 3.9  01/01/2020 0856   CL 103 01/01/2020 0856   CO2 25 01/01/2020 0856   BUN 18 01/01/2020 0856   BUN 6 (L) 09/19/2018 1038   CREATININE 0.75 01/01/2020 0856   GLUCOSE 107 (H) 01/01/2020 0856   CALCIUM 9.6 01/01/2020 0856   AST 15 01/01/2020 0856   ALT 12 01/01/2020 0856   ALKPHOS 63 01/01/2020 0856   BILITOT 0.7 01/01/2020 0856   BILITOT 0.5 09/19/2018 1038   PROT 7.3 01/01/2020 0856   PROT 7.4 09/19/2018 1038   ALBUMIN 4.1 01/01/2020 0856   ALBUMIN 4.3 09/19/2018 1038    RADIOGRAPHIC STUDIES: No results found.  PERFORMANCE STATUS (ECOG) : 1 - Symptomatic but completely ambulatory  Review of Systems Unless otherwise noted, a complete review of systems is negative.  Physical Exam General: NAD, frail appearing, thin Pulmonary: Unlabored Extremities: no edema, no joint deformities Skin: no rashes Neurological: Weakness but otherwise nonfocal  IMPRESSION: I met with patient in the infusion area.  She reports that she is doing reasonably well.  She denies any significant changes or concerns.  Pain is better controlled with use of transdermal fentanyl.  She still requires oxycodone as needed for breakthrough pain.  Appetite is reportedly stable.  Patient has more weakness but is still independent with her own care.  Her sisters have hired Scientist, physiological to help with household  chores.  Patient spoke about the stage of her cancer and how she is already outlived her life expectancy from her initial diagnosis.  She remains in agreement with current scope of treatment.  PLAN: -Continue current scope of treatment -Continue transdermal fentanyl 25 mcg every 72 hours with use of as needed Norco for breakthrough pain -RTC in 3 weeks   Patient expressed understanding and was in agreement with this plan. She also understands that She can call the clinic at any time with any questions, concerns, or complaints.     Time Total: 20 minutes  Visit consisted of counseling and education dealing with the complex and emotionally intense issues of symptom management and palliative care in the setting of serious and potentially life-threatening illness.Greater than 50%  of this time was spent counseling and coordinating care related to the above assessment and plan.  Signed by: Altha Harm, PhD, NP-C [phone] (Work Cell)

## 2020-01-01 NOTE — Progress Notes (Signed)
Hematology/Oncology Follow up note [facility] [phone] [phone]   Patient Care Team: Glean Hess, MD as PCP - General (Internal Medicine) Charolette Forward, MD as Consulting Physician (Cardiology) Telford Nab, RN as Registered Nurse  REFERRING PROVIDER: Dr. Felicie Morn REASON FOR VISIT:  Follow-up for small cell lung cancer,   HISTORY OF PRESENTING ILLNESS:  Kristi [name] is a  74 y.o.  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  10/20/2018 CT chest with contrast showed large mediastinal mass involving both hilar, left greater than right, consistent with lung carcinoma, The mass causes narrowing of the left mainstem bronchus with resultant volume loss on the left and a mediastinal shift to the left.  Moderate size left pleural effusion. 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  # MRI brain negative.  # Nov 2019- Jan 2020 s/p 4 cycles of Carbo/Etoposide/Tecentriq #  01/24/2019 interim CT scan done which showed continued positive response to therapy with continued reduction in mediastinal adenopathy.  No residual measurable left lung mass. CT findings of acute emphysematous cystitis. Urology did not feel that patient has pyelonephritis and recommend treatment with antibiotics because patient's immunocompromise. Patient finished treatment.   # Chest and whole brain radiation finished in May 2020 # 04/25/2019 resume Tecentriq every 3 weeks.  INTERVAL HISTORY Kristi [name] is a 74 y.o. female who has above history reviewed by me today presents for evaluation prior to immunotherapy for treatment of extensive small cell lung cancer. Currently patient is on immunotherapy maintenance. Patient is accompanied by her husband today. Chronic upper back pain from compression fracture is well controlled on pain  medication.  She takes Norco as needed.  She is on fentanyl patch. Continues to be quite forgetful. Denies any dizziness, nausea, vomiting, fever, dysuria, diarrhea, skin rash. Occasional cough, chronic.   Review of Systems  Constitutional: Positive for fatigue. Negative for appetite change, chills and fever.  HENT:   Negative for hearing loss and voice change.   Eyes: Negative for eye problems.  Respiratory: Negative for chest tightness, cough and shortness of breath.   Cardiovascular: Negative for chest pain.  Gastrointestinal: Negative for abdominal distention, abdominal pain, blood in stool, diarrhea and nausea.  Endocrine: Negative for hot flashes.  Genitourinary: Negative for difficulty urinating and frequency.   Musculoskeletal: Positive for back pain. Negative for arthralgias.  Skin: Negative for itching and rash.  Neurological: Negative for extremity weakness.  Hematological: Negative for adenopathy.  Psychiatric/Behavioral: Negative for confusion and depression. The patient is not nervous/anxious.        Forgetful     MEDICAL HISTORY:  Past Medical History:  Diagnosis Date  . Acute MI, inferoposterior wall ([place]) 09/30/2014  . Claustrophobia   . Coronary artery disease   . Hypercholesteremia   . Hypertension   . MI, old   . 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 [facility]) 10/27/2018    SURGICAL HISTORY: Past Surgical History:  Procedure Laterality Date  . APPENDECTOMY     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  . BLADDER NECK SUSPENSION    . CHOLECYSTECTOMY N/A 08/14/2018   Procedure: LAPAROSCOPIC CHOLECYSTECTOMY;  Surgeon: Jules Husbands, MD;  Location: [facility] ORS;  Service: General;  Laterality: N/A;  . CHOLECYSTECTOMY  11/2018  . CORONARY STENT PLACEMENT    . ENDOBRONCHIAL ULTRASOUND N/A 10/21/2018   Procedure: ENDOBRONCHIAL ULTRASOUND;  Surgeon: Laverle Hobby, MD;  Location: [facility] ORS;  Service: Pulmonary;  Laterality: N/A;  . HERNIA REPAIR    . IR FLUORO GUIDE CV LINE RIGHT   12/19/2018  . LEFT HEART CATHETERIZATION WITH CORONARY ANGIOGRAM N/A 09/29/2014   Procedure: LEFT HEART CATHETERIZATION WITH CORONARY ANGIOGRAM;  Surgeon: Clent Demark, MD;  Location: [facility] CATH LAB;  Service: Cardiovascular;  Laterality: N/A;  . PORTA CATH INSERTION N/A 01/02/2019   Procedure: PORTA CATH INSERTION;  Surgeon: Algernon Huxley, MD;  Location: [place] CV LAB;  Service: Cardiovascular;  Laterality: N/A;  . PORTACATH PLACEMENT Right 10/28/2018   Procedure: INSERTION PORT-A-CATH;  Surgeon: Jules Husbands, MD;  Location: [facility] ORS;  Service: General;  Laterality: Right;    SOCIAL HISTORY: Social History   Socioeconomic History  . Marital status: Married    Spouse name: [name]   . Number of children: 3  . Years of education: Not on file  . Highest education level: Not on file  Occupational History  . Occupation: Retired    Comment: Chief Technology Officer   Tobacco Use  . Smoking status: Former Smoker    Packs/day: 1.00    Years: 39.00    Pack years: 39.00    Types: Cigarettes    Start date: 12/21/1978    Quit date: 10/16/2018    Years since quitting: 1.2  . Smokeless tobacco: Never Used  Substance and Sexual Activity  . Alcohol use: Yes    Alcohol/week: 0.0 standard drinks    Comment: occassional - approx 1 every 2 weeks   . Drug use: No  . Sexual activity: Yes    Birth control/protection: None  Other Topics Concern  . Not on file  Social History Narrative  . Not on file   Social Determinants of Health   Financial Resource Strain: Low Risk   . Difficulty of Paying Living Expenses: Not very hard  Food Insecurity:   . Worried About Charity fundraiser in the Last Year: Not on file  . Ran Out of Food in the Last Year: Not on file  Transportation Needs: No Transportation Needs  . Lack of Transportation (Medical): No  . Lack of Transportation (Non-Medical): No  Physical Activity: Unknown  . Days of Exercise per Week: 0 days  . Minutes of Exercise per Session: Not on  file  Stress: Stress Concern Present  . Feeling of Stress : To some extent  Social Connections: Unknown  . Frequency of Communication with Friends and Family: Three times a week  . Frequency of Social Gatherings with Friends and Family: Once a week  . Attends Religious Services: 1 to 4 times per year  . Active Member of Clubs or Organizations: Not on file  . Attends Archivist Meetings: Not on file  . Marital Status: Married  Human resources officer Violence: Not At Risk  . Fear of Current or Ex-Partner: No  . Emotionally Abused: No  . Physically Abused: No  . Sexually Abused: No    FAMILY HISTORY: Family History  Problem Relation Age of Onset  . Alzheimer's disease Mother   . Colon cancer Father   . Breast cancer Neg Hx     ALLERGIES:  has No Known Allergies.  MEDICATIONS:  Current Outpatient Medications  Medication Sig Dispense Refill  . CALCIUM PO Take 1 tablet by mouth daily.     . Cholecalciferol (VITAMIN D) 50 MCG (2000 UT) CAPS Take 1 capsule by mouth daily.    . fentaNYL (DURAGESIC) 25 MCG/HR Place 1 patch  onto the skin every 3 (three) days. 10 patch 0  . HYDROcodone-acetaminophen (NORCO/VICODIN) 5-325 MG tablet Take 1 tablet by mouth daily as needed for moderate pain. 30 tablet 0  . lidocaine-prilocaine (EMLA) cream Apply to affected area once 30 g 3  . omeprazole (PRILOSEC) 40 MG capsule TAKE ONE CAPSULE BY MOUTH DAILY 90 capsule 1  . ondansetron (ZOFRAN) 8 MG tablet Take 1 tablet (8 mg total) by mouth 2 (two) times daily as needed for refractory nausea / vomiting. Start on day 3 after carboplatin chemo. 30 tablet 1  . prochlorperazine (COMPAZINE) 10 MG tablet TAKE 1 TABLET BY MOUTH EVERY 6 HOURS AS NEEDED FOR NAUSEA OR VOMITING 270 tablet 1  . vitamin B-12 (CYANOCOBALAMIN) 1000 MCG tablet Take 1,000 mcg by mouth daily.    [name] Kitchen atorvastatin (LIPITOR) 80 MG tablet Take 0.5 tablets (40 mg total) by mouth daily at 6 PM. (Patient not taking: Reported on 11/20/2019) 30  tablet 3  . dexamethasone (DECADRON) 4 MG tablet Take 1 tablet (4 mg total) by mouth daily. (Patient not taking: Reported on 12/11/2019) 25 tablet 0  . diphenhydrAMINE-zinc acetate (BENADRYL EXTRA STRENGTH) cream Apply 1 application topically 3 (three) times daily as needed for itching. (Patient not taking: Reported on 01/01/2020) 28.4 g 0  . metoprolol tartrate (LOPRESSOR) 25 MG tablet Take 12.5 mg by mouth 2 (two) times daily.    . nitroGLYCERIN (NITROSTAT) 0.4 MG SL tablet Place 1 tablet (0.4 mg total) under the tongue every 5 (five) minutes x 3 doses as needed for chest pain. (Patient not taking: Reported on 11/20/2019) 25 tablet 12  . polyethylene glycol (MIRALAX / GLYCOLAX) packet Take 17 g by mouth daily as needed.     . predniSONE (DELTASONE) 20 MG tablet Take 1 tablet (20 mg total) by mouth daily with breakfast. (Patient not taking: Reported on 11/20/2019) 5 tablet 0  . sertraline (ZOLOFT) 50 MG tablet Take 1.5 tablets (75 mg total) by mouth daily. 45 tablet 1  . triamterene-hydrochlorothiazide (DYAZIDE) 37.5-25 MG capsule Take 1 each (1 capsule total) by mouth daily as needed. (Patient not taking: Reported on 10/25/2019) 90 capsule 0   No current facility-administered medications for this visit.   Facility-Administered Medications Ordered in Other Visits  Medication Dose Route Frequency Provider Last Rate Last Admin  . heparin lock flush 100 unit/mL  500 Units Intravenous Once Earlie Server, MD      . heparin lock flush 100 unit/mL  500 Units Intravenous Once Earlie Server, MD      . sodium chloride flush (NS) 0.9 % injection 10 mL  10 mL Intravenous PRN Earlie Server, MD   10 mL at 01/09/19 0820  . sodium chloride flush (NS) 0.9 % injection 10 mL  10 mL Intravenous PRN Earlie Server, MD   10 mL at 01/10/19 1400     PHYSICAL EXAMINATION: ECOG PERFORMANCE STATUS: 1 - Symptomatic but completely ambulatory Vitals:   01/01/20 0926  BP: 122/87  Pulse: 76  Resp: 18  Temp: (!) 97.4 F (36.3 C)  SpO2: 100%    Filed Weights   01/01/20 0926  Weight: 149 lb 9.6 oz (67.9 kg)   Physical Exam  Constitutional: She is oriented to person, place, and time. No distress.  HENT:  Head: Normocephalic and atraumatic.  Nose: Nose normal.  Mouth/Throat: Oropharynx is clear and moist. No oropharyngeal exudate.  Eyes: Pupils are equal, round, and reactive to light. EOM are normal. No scleral icterus.  Cardiovascular: Normal rate and regular  rhythm.  No murmur heard. Pulmonary/Chest: Effort normal. No respiratory distress. She has no rales. She exhibits no tenderness.  Decreased breath sounds bilaterally, right side breath sound is more diminished.   Abdominal: Soft. She exhibits no distension. There is no abdominal tenderness.  Musculoskeletal:        General: No edema. Normal range of motion.     Cervical back: Normal range of motion and neck supple.  Neurological: She is alert and oriented to person, place, and time. No cranial nerve deficit. She exhibits normal muscle tone. Coordination normal.  Skin: Skin is warm and dry. She is not diaphoretic. No erythema.  Psychiatric: Affect normal.       LABORATORY DATA:  I have reviewed the data as listed Lab Results  Component Value Date   WBC 6.1 01/01/2020   HGB 12.1 01/01/2020   HCT 37.0 01/01/2020   MCV 89.2 01/01/2020   PLT 162 01/01/2020   Recent Labs    11/20/19 0932 12/11/19 0821 01/01/20 0856  NA 132* 139 138  K 4.0 3.7 3.9  CL 102 105 103  CO2 22 25 25   GLUCOSE 106* 98 107*  BUN 13 13 18   CREATININE 0.65 0.79 0.75  CALCIUM 8.8* 9.2 9.6  GFRNONAA >60 >60 >60  GFRAA >60 >60 >60  PROT 7.4 6.9 7.3  ALBUMIN 4.0 3.9 4.1  AST 14* 14* 15  ALT 12 12 12   ALKPHOS 88 65 63  BILITOT 0.7 0.6 0.7    RADIOGRAPHIC STUDIES: I have personally reviewed the radiological images as listed and agreed with the findings in the report.  CT Chest W Contrast  Result Date: 10/23/2019 CLINICAL DATA:  Extensive stage small cell left lung cancer  diagnosed October 2019 status post chemotherapy and chest and brain radiation therapy. Ongoing chemotherapy. Restaging. Patient reports productive cough and right upper quadrant pain. EXAM: CT CHEST, ABDOMEN, AND PELVIS WITH CONTRAST TECHNIQUE: Multidetector CT imaging of the chest, abdomen and pelvis was performed following the standard protocol during bolus administration of intravenous contrast. CONTRAST:  169mL OMNIPAQUE IOHEXOL 300 MG/ML  SOLN COMPARISON:  07/27/2019 CT chest, abdomen and pelvis. FINDINGS: CT CHEST FINDINGS Cardiovascular: Normal heart size. No significant pericardial effusion/thickening. Three-vessel coronary atherosclerosis. Right internal jugular Port-A-Cath terminates at the cavoatrial junction. Atherosclerotic nonaneurysmal thoracic aorta. Normal caliber pulmonary arteries. No central pulmonary emboli. Mediastinum/Nodes: No discrete thyroid nodules. Unremarkable esophagus. No pathologically enlarged axillary, mediastinal or hilar lymph nodes. Lungs/Pleura: No pneumothorax. No pleural effusion. Mild centrilobular and paraseptal emphysema. No acute consolidative airspace disease, lung masses or significant pulmonary nodules. Stable calcified subcentimeter right pulmonary granulomas. Musculoskeletal: No aggressive appearing focal osseous lesions. New mild superior T7 vertebral compression deformity. Mild thoracic spondylosis. CT ABDOMEN PELVIS FINDINGS Hepatobiliary: Normal liver with no liver mass. Cholecystectomy. No biliary ductal dilatation. Pancreas: Normal, with no mass or duct dilation. Spleen: Normal size. No mass. Adrenals/Urinary Tract: Normal adrenals. Normal kidneys with no hydronephrosis and no renal mass. Small cystocele. Otherwise normal bladder. Stomach/Bowel: Normal non-distended stomach. Stable small to moderate paraumbilical hernia containing multiple small bowel loops. No small bowel dilatation, focal caliber transition, wall thickening or pneumatosis. Oral contrast  transits to the cecum. Appendectomy. Mild diffuse colonic diverticulosis, with no large bowel wall thickening or acute pericolonic fat stranding. Vascular/Lymphatic: Atherosclerotic nonaneurysmal abdominal aorta. Patent portal, splenic, hepatic and renal veins. No pathologically enlarged lymph nodes in the abdomen or pelvis. Reproductive: Grossly normal uterus.  No adnexal mass. Other: No pneumoperitoneum, ascites or focal fluid collection. Musculoskeletal: No aggressive  appearing focal osseous lesions. The hypermetabolic right acetabular lesion on 10/26/2018 PET-CT study is not evident on today's CT scan. Mild lumbar spondylosis. IMPRESSION: 1. No findings of metastatic disease in the chest, abdomen or pelvis. No evidence of recurrent pulmonary neoplasm. 2. New mild superior T7 vertebral compression fracture without evidence of underlying pathologic osseous lesion. 3. Stable small to moderate paraumbilical ventral abdominal hernia containing small bowel loops, without acute bowel complication. 4. Aortic Atherosclerosis (ICD10-I70.0) and Emphysema (ICD10-J43.9). Electronically Signed   By: Ilona Sorrel M.D.   On: 10/23/2019 10:29   MR Brain W Wo Contrast  Result Date: 12/01/2019 CLINICAL DATA:  74 year old female with history of small cell lung cancer. Status post prophylactic cranial radiation completed in May or June this year. Restaging. EXAM: MRI HEAD WITHOUT AND WITH CONTRAST TECHNIQUE: Multiplanar, multiecho pulse sequences of the brain and surrounding structures were obtained without and with intravenous contrast. CONTRAST:  29mL GADAVIST GADOBUTROL 1 MMOL/ML IV SOLN COMPARISON:  Brain MRI 10/28/2018. FINDINGS: Brain: No restricted diffusion to suggest acute infarction. No midline shift, mass effect, evidence of mass lesion, ventriculomegaly, extra-axial collection or acute intracranial hemorrhage. Cervicomedullary junction and pituitary are within normal limits. No abnormal enhancement identified. No  dural thickening. Progressed and now widespread bilateral cerebral white matter T2 and FLAIR hyperintensity, involving the deep white matter capsules also. Cerebral volume appears stable. Similar patchy and confluent new or increased T2 signal in the pons and deep cerebellar nuclei. No cortical encephalomalacia or chronic cerebral blood products identified. Vascular: Major intracranial vascular flow voids are stable, with loss of the distal left vertebral artery flow void as noted in 2019. The major dural venous sinuses are enhancing and appear to be patent. Skull and upper cervical spine: Negative visible cervical spine aside from degenerative changes. Negative visible spinal cord. Visualized bone marrow signal is within normal limits. Sinuses/Orbits: Stable and negative. Other: Trace left mastoid fluid is stable. Visible internal auditory structures appear normal. Scalp and face soft tissues appear negative. IMPRESSION: Sequelae of whole brain radiation. No metastatic disease or acute intracranial abnormality. Electronically Signed   By: Genevie Ann M.D.   On: 12/01/2019 11:45   MR Thoracic Spine W Wo Contrast  Result Date: 12/01/2019 CLINICAL DATA:  Upper back pain for approximately 3 weeks. The patient has suffered several falls. EXAM: MRI THORACIC WITHOUT AND WITH CONTRAST TECHNIQUE: Multiplanar and multiecho pulse sequences of the thoracic spine were obtained without and with intravenous contrast. CONTRAST:  6 mL GADAVIST IV SOLN COMPARISON:  CT chest, abdomen and pelvis 07/27/2019 and 10/23/2019. FINDINGS: MRI THORACIC SPINE FINDINGS Alignment:  Maintained.  Exaggeration of thoracic kyphosis is noted. Vertebrae: The patient has a severe compression fracture of T7 with vertebra plana deformity identified. There is marrow edema and enhancement in the vertebral body. Mild marrow edema in the T7 pedicles is greater on the right. There was a mild superior endplate compression fracture at T7 on the 10/23/2019 CT  and T7 was intact on the 07/27/2019 CT. Minimal marrow edema is seen in the anterior aspect of the inferior endplate of T6. Cord:  Normal signal throughout. Paraspinal and other soft tissues: Negative. Disc levels: T1-2 through T5-6 are negative. T6-7: Mild bony retropulsion is seen off the posterior margin of T7 but there is no stenosis. No epidural hematoma is identified. T8-9: Shallow left paracentral protrusion indents the ventral thecal sac but the central canal and foramina are open. T9-10: Shallow right paracentral protrusion without stenosis. T10-11: Minimal disc bulge without stenosis. T11-12:  Minimal disc bulge without stenosis. T12-L1: Negative. 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 There is mild bony retropulsion off the posterior margin of T7 but no stenosis. Small focus of marrow edema in the anterior, inferior endplate of T6 may be due to contusion without fracture. 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 Electronically Signed   By: Inge Rise M.D.   On: 12/01/2019 11:29   CT Abdomen Pelvis W Contrast  Result Date: 10/23/2019 CLINICAL DATA:  Extensive stage small cell left lung cancer diagnosed October 2019 status post chemotherapy and chest and brain radiation therapy. Ongoing chemotherapy. Restaging. Patient reports productive cough and right upper quadrant pain. EXAM: CT CHEST, ABDOMEN, AND PELVIS WITH CONTRAST TECHNIQUE: Multidetector CT imaging of the chest, abdomen and pelvis was performed following the standard protocol during bolus administration of intravenous contrast. CONTRAST:  139mL OMNIPAQUE IOHEXOL 300 MG/ML  SOLN COMPARISON:  07/27/2019 CT chest, abdomen and pelvis. FINDINGS: CT CHEST FINDINGS Cardiovascular: Normal  heart size. No significant pericardial effusion/thickening. Three-vessel coronary atherosclerosis. Right internal jugular Port-A-Cath terminates at the cavoatrial junction. Atherosclerotic nonaneurysmal thoracic aorta. Normal caliber pulmonary arteries. No central pulmonary emboli. Mediastinum/Nodes: No discrete thyroid nodules. Unremarkable esophagus. No pathologically enlarged axillary, mediastinal or hilar lymph nodes. Lungs/Pleura: No pneumothorax. No pleural effusion. Mild centrilobular and paraseptal emphysema. No acute consolidative airspace disease, lung masses or significant pulmonary nodules. Stable calcified subcentimeter right pulmonary granulomas. Musculoskeletal: No aggressive appearing focal osseous lesions. New mild superior T7 vertebral compression deformity. Mild thoracic spondylosis. CT ABDOMEN PELVIS FINDINGS Hepatobiliary: Normal liver with no liver mass. Cholecystectomy. No biliary ductal dilatation. Pancreas: Normal, with no mass or duct dilation. Spleen: Normal size. No mass. Adrenals/Urinary Tract: Normal adrenals. Normal kidneys with no hydronephrosis and no renal mass. Small cystocele. Otherwise normal bladder. Stomach/Bowel: Normal non-distended stomach. Stable small to moderate paraumbilical hernia containing multiple small bowel loops. No small bowel dilatation, focal caliber transition, wall thickening or pneumatosis. Oral contrast transits to the cecum. Appendectomy. Mild diffuse colonic diverticulosis, with no large bowel wall thickening or acute pericolonic fat stranding. Vascular/Lymphatic: Atherosclerotic nonaneurysmal abdominal aorta. Patent portal, splenic, hepatic and renal veins. No pathologically enlarged lymph nodes in the abdomen or pelvis. Reproductive: Grossly normal uterus.  No adnexal mass. Other: No pneumoperitoneum, ascites or focal fluid collection. Musculoskeletal: No aggressive appearing focal osseous lesions. The hypermetabolic right acetabular lesion on  10/26/2018 PET-CT study is not evident on today's CT scan. Mild lumbar spondylosis. IMPRESSION: 1. No findings of metastatic disease in the chest, abdomen or pelvis. No evidence of recurrent pulmonary neoplasm. 2. New mild superior T7 vertebral compression fracture without evidence of underlying pathologic osseous lesion. 3. Stable small to moderate paraumbilical ventral abdominal hernia containing small bowel loops, without acute bowel complication. 4. Aortic Atherosclerosis (ICD10-I70.0) and Emphysema (ICD10-J43.9). Electronically Signed   By: Ilona Sorrel M.D.   On: 10/23/2019 10:29     ASSESSMENT & PLAN:  1. Small cell lung cancer ([place])   2. Encounter for antineoplastic immunotherapy   3. Thoracic compression fracture, closed, initial encounter ([place])   4. Depression, unspecified depression type    #Extensive small cell lung cancer S/p 4 cycles of Carboplatin, Etoposide and Tecentriq.   Status post chest and whole brain radiation.  Patient is clinically doing well. Labs are reviewed and discussed with patient.  Her counts acceptable to proceed with Tecentriq maintenance today. Last CT was done in November 2020.  Plan February 2021 to repeat CT chest abdomen pelvis.  #T7 vertebral compression fracture without evidence of underlying pathological osseous lesions. Continue fentanyl patch with Norco as needed for pain.  #Osteopenia continue calcium and vitamin D supplementation. Bone metastasis, proceed with Zometa every 4 to 6 weeks  Depression has been well controlled.  Continue Zoloft... Follow-up with palliative care. Follow up in 3 weeks for eval with next cycle of tecentriq and Zometa.    Earlie Server, MD, PhD

## 2020-01-01 NOTE — Addendum Note (Signed)
Addended by: Altha Harm R on: 01/01/2020 12:43 PM   Modules accepted: Orders

## 2020-01-19 ENCOUNTER — Other Ambulatory Visit: Payer: Self-pay

## 2020-01-19 ENCOUNTER — Ambulatory Visit (INDEPENDENT_AMBULATORY_CARE_PROVIDER_SITE_OTHER)
Admission: EM | Admit: 2020-01-19 | Discharge: 2020-01-19 | Disposition: A | Discharge status: Other Acute Inpatient Hospital | Payer: Medicare Other | Source: Home / Self Care | Attending: Family Medicine | Admitting: Family Medicine

## 2020-01-19 ENCOUNTER — Emergency Department: Payer: Medicare Other

## 2020-01-19 ENCOUNTER — Emergency Department
Admission: EM | Admit: 2020-01-19 | Discharge: 2020-01-19 | Disposition: A | Discharge status: Home / Self Care | Payer: Medicare Other | Attending: Student | Admitting: Student

## 2020-01-19 ENCOUNTER — Encounter: Payer: Self-pay | Admitting: Emergency Medicine

## 2020-01-19 DIAGNOSIS — S22000A Wedge compression fracture of unspecified thoracic vertebra, initial encounter for closed fracture: Secondary | ICD-10-CM | POA: Diagnosis not present

## 2020-01-19 DIAGNOSIS — Z85118 Personal history of other malignant neoplasm of bronchus and lung: Secondary | ICD-10-CM | POA: Diagnosis not present

## 2020-01-19 DIAGNOSIS — I5030 Unspecified diastolic (congestive) heart failure: Secondary | ICD-10-CM | POA: Diagnosis not present

## 2020-01-19 DIAGNOSIS — I25118 Atherosclerotic heart disease of native coronary artery with other forms of angina pectoris: Secondary | ICD-10-CM | POA: Insufficient documentation

## 2020-01-19 DIAGNOSIS — C3490 Malignant neoplasm of unspecified part of unspecified bronchus or lung: Secondary | ICD-10-CM

## 2020-01-19 DIAGNOSIS — S0990XA Unspecified injury of head, initial encounter: Secondary | ICD-10-CM

## 2020-01-19 DIAGNOSIS — Y999 Unspecified external cause status: Secondary | ICD-10-CM | POA: Insufficient documentation

## 2020-01-19 DIAGNOSIS — S299XXA Unspecified injury of thorax, initial encounter: Secondary | ICD-10-CM | POA: Diagnosis not present

## 2020-01-19 DIAGNOSIS — S060X9A Concussion with loss of consciousness of unspecified duration, initial encounter: Secondary | ICD-10-CM

## 2020-01-19 DIAGNOSIS — W109XXA Fall (on) (from) unspecified stairs and steps, initial encounter: Secondary | ICD-10-CM | POA: Diagnosis not present

## 2020-01-19 DIAGNOSIS — I110 Hypertensive heart disease with heart failure: Secondary | ICD-10-CM | POA: Insufficient documentation

## 2020-01-19 DIAGNOSIS — Z87891 Personal history of nicotine dependence: Secondary | ICD-10-CM | POA: Insufficient documentation

## 2020-01-19 DIAGNOSIS — I252 Old myocardial infarction: Secondary | ICD-10-CM | POA: Insufficient documentation

## 2020-01-19 DIAGNOSIS — S0083XA Contusion of other part of head, initial encounter: Secondary | ICD-10-CM | POA: Diagnosis not present

## 2020-01-19 DIAGNOSIS — Y92009 Unspecified place in unspecified non-institutional (private) residence as the place of occurrence of the external cause: Secondary | ICD-10-CM | POA: Diagnosis not present

## 2020-01-19 DIAGNOSIS — Z23 Encounter for immunization: Secondary | ICD-10-CM | POA: Insufficient documentation

## 2020-01-19 DIAGNOSIS — Y939 Activity, unspecified: Secondary | ICD-10-CM | POA: Diagnosis not present

## 2020-01-19 DIAGNOSIS — W108XXA Fall (on) (from) other stairs and steps, initial encounter: Secondary | ICD-10-CM | POA: Insufficient documentation

## 2020-01-19 DIAGNOSIS — W19XXXA Unspecified fall, initial encounter: Secondary | ICD-10-CM

## 2020-01-19 DIAGNOSIS — S3991XA Unspecified injury of abdomen, initial encounter: Secondary | ICD-10-CM | POA: Insufficient documentation

## 2020-01-19 DIAGNOSIS — S199XXA Unspecified injury of neck, initial encounter: Secondary | ICD-10-CM | POA: Diagnosis not present

## 2020-01-19 DIAGNOSIS — Z79899 Other long term (current) drug therapy: Secondary | ICD-10-CM | POA: Insufficient documentation

## 2020-01-19 DIAGNOSIS — R079 Chest pain, unspecified: Secondary | ICD-10-CM | POA: Insufficient documentation

## 2020-01-19 DIAGNOSIS — R0789 Other chest pain: Secondary | ICD-10-CM | POA: Diagnosis not present

## 2020-01-19 DIAGNOSIS — S6992XA Unspecified injury of left wrist, hand and finger(s), initial encounter: Secondary | ICD-10-CM | POA: Diagnosis not present

## 2020-01-19 DIAGNOSIS — S22069A Unspecified fracture of T7-T8 vertebra, initial encounter for closed fracture: Secondary | ICD-10-CM | POA: Diagnosis not present

## 2020-01-19 LAB — BASIC METABOLIC PANEL
Anion gap: 9 (ref 5–15)
BUN: 11 mg/dL (ref 8–23)
CO2: 24 mmol/L (ref 22–32)
Calcium: 9.3 mg/dL (ref 8.9–10.3)
Chloride: 104 mmol/L (ref 98–111)
Creatinine, Ser: 0.7 mg/dL (ref 0.44–1.00)
GFR calc Af Amer: >60 mL/min (ref >60)
GFR calc non Af Amer: >60 mL/min (ref >60)
Glucose, Bld: 103 mg/dL — ABNORMAL HIGH (ref 70–99)
Potassium: 3.7 mmol/L (ref 3.5–5.1)
Sodium: 137 mmol/L (ref 135–145)

## 2020-01-19 LAB — CBC WITH DIFFERENTIAL/PLATELET
Abs Immature Granulocytes: 0.04 10*3/uL (ref 0.00–0.07)
Basophils Absolute: 0 10*3/uL (ref 0.0–0.1)
Basophils Relative: 1 %
Eosinophils Absolute: 0.3 10*3/uL (ref 0.0–0.5)
Eosinophils Relative: 4 %
HCT: 36.1 % (ref 36.0–46.0)
Hemoglobin: 12 g/dL (ref 12.0–15.0)
Immature Granulocytes: 1 %
Lymphocytes Relative: 18 %
Lymphs Abs: 1.2 10*3/uL (ref 0.7–4.0)
MCH: 29.1 pg (ref 26.0–34.0)
MCHC: 33.2 g/dL (ref 30.0–36.0)
MCV: 87.4 fL (ref 80.0–100.0)
Monocytes Absolute: 0.9 10*3/uL (ref 0.1–1.0)
Monocytes Relative: 14 %
Neutro Abs: 4.1 10*3/uL (ref 1.7–7.7)
Neutrophils Relative %: 62 %
Platelets: 144 10*3/uL — ABNORMAL LOW (ref 150–400)
RBC: 4.13 MIL/uL (ref 3.87–5.11)
RDW: 13.4 % (ref 11.5–15.5)
WBC: 6.5 10*3/uL (ref 4.0–10.5)
nRBC: 0 % (ref 0.0–0.2)

## 2020-01-19 LAB — URINALYSIS, COMPLETE (UACMP) WITH MICROSCOPIC
Bacteria, UA: NONE SEEN
Bilirubin Urine: NEGATIVE
Glucose, UA: NEGATIVE mg/dL
Hgb urine dipstick: NEGATIVE
Ketones, ur: NEGATIVE mg/dL
Nitrite: NEGATIVE
Protein, ur: NEGATIVE mg/dL
Specific Gravity, Urine: 1.005 (ref 1.005–1.030)
pH: 7 (ref 5.0–8.0)

## 2020-01-19 LAB — TROPONIN I (HIGH SENSITIVITY): Troponin I (High Sensitivity): <2 ng/L (ref <18)

## 2020-01-19 MED ORDER — TETANUS-DIPHTH-ACELL PERTUSSIS 5-2.5-18.5 LF-MCG/0.5 IM SUSP
0.5000 mL | Freq: Once | INTRAMUSCULAR | Status: AC
  Administered 2020-01-19: 0.5 mL via INTRAMUSCULAR
  Filled 2020-01-19: qty 0.5

## 2020-01-19 MED ORDER — IOHEXOL 300 MG/ML  SOLN
100.0000 mL | Freq: Once | INTRAMUSCULAR | Status: AC | PRN
  Administered 2020-01-19: 18:00:00 100 mL via INTRAVENOUS

## 2020-01-19 MED ORDER — MORPHINE SULFATE (PF) 4 MG/ML IV SOLN
4.0000 mg | Freq: Once | INTRAVENOUS | Status: AC
  Administered 2020-01-19: 4 mg via INTRAVENOUS
  Filled 2020-01-19: qty 1

## 2020-01-19 MED ORDER — HEPARIN SOD (PORK) LOCK FLUSH 100 UNIT/ML IV SOLN
INTRAVENOUS | Status: AC
  Filled 2020-01-19: qty 5

## 2020-01-19 MED ORDER — LIDOCAINE-PRILOCAINE 2.5-2.5 % EX CREA
TOPICAL_CREAM | Freq: Once | CUTANEOUS | Status: AC
  Administered 2020-01-19: 1 via TOPICAL
  Filled 2020-01-19: qty 5

## 2020-01-19 NOTE — ED Notes (Signed)
Soaked bandage on left arm in normal saline to get it unstuck from arm- able to get it unstuck to reveal large skin tear- no active bleeding at this time- Dr Joan Mayans aware

## 2020-01-19 NOTE — ED Provider Notes (Signed)
[facility] Emergency Department Provider Note   ____________________________________________   First MD Initiated Contact with Patient 01/19/20 1355     (approximate)  I have reviewed the triage vital signs and the nursing notes.   HISTORY  Chief Complaint Chest Pain and Fall    HPI Kristi [name] is a 74 y.o. female with past medical history of metastatic lung cancer, CAD, hypertension, hyperlipidemia who presents to the ED following fall.  Patient reports that 2 days ago she was going up the stairs when she suddenly felt weak and her legs gave out, causing her to fall back down the concrete steps.  She hit her head, but did not lose consciousness.  She initially did not have much pain but now complains of increasing pain to her chest wall and so decided to seek care at an urgent care.  She was referred from the urgent care to the ED for further evaluation.  She additionally complains of a small wound to her left frontal scalp as well as a skin tear to her left arm.  She denies any headache, neck pain, vision changes, speech changes, numbness, or weakness.  She has been able to ambulate since the fall, but does complain of some generalized weakness.        Past Medical History:  Diagnosis Date  . Acute MI, inferoposterior wall ([place]) 09/30/2014  . Claustrophobia   . Coronary artery disease   . Hypercholesteremia   . Hypertension   . MI, old   . 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 Encounter for antineoplastic immunotherapy 09/14/2019  . Memory loss 09/14/2019  . Other fatigue 09/14/2019  . Cancer of hilus of left lung ([place]) 06/08/2019  . Dehydration 01/30/2019  . Acute pyelonephritis 01/24/2019  . Anemia due to antineoplastic chemotherapy 01/09/2019  . Encounter for antineoplastic chemotherapy 11/02/2018  . Cough 10/28/2018  . Dysphagia 10/28/2018  . Decrease in appetite  10/28/2018  . Goals of care, counseling/discussion 10/28/2018  . 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 Grade I diastolic dysfunction 42/59/5638  . Localized edema 08/29/2018  . Cholecystitis 08/13/2018  . COPD with exacerbation ([place]) 08/11/2018  . Osteopenia determined by x-ray 05/19/2018  . Prediabetes 02/08/2018  . Hx of fracture of radius 08/18/2016  . Vitamin D deficiency 01/07/2016  . Hyperlipidemia 01/02/2016  . Hypertension 01/02/2016  . Chronic pain 01/02/2016  . Obesity (BMI 30.0-34.9) 01/02/2016  . Coronary artery disease with stable angina pectoris ([place]) 01/02/2016  . IBS (irritable bowel syndrome) 01/02/2016  . FH: colon cancer 01/02/2016  . DDD (degenerative disc disease), cervical 01/02/2016  . Primary osteoarthritis of left knee 01/02/2016  . Tobacco use disorder 01/02/2016    Past Surgical History:  Procedure Laterality Date  . APPENDECTOMY     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  . BLADDER NECK SUSPENSION    . CHOLECYSTECTOMY N/A 08/14/2018   Procedure: LAPAROSCOPIC CHOLECYSTECTOMY;  Surgeon: Jules Husbands, MD;  Location: [facility] ORS;  Service: General;  Laterality: N/A;  . CHOLECYSTECTOMY  11/2018  . CORONARY STENT PLACEMENT    . ENDOBRONCHIAL ULTRASOUND N/A 10/21/2018   Procedure: ENDOBRONCHIAL ULTRASOUND;  Surgeon: Laverle Hobby, MD;  Location: [facility] ORS;  Service: Pulmonary;  Laterality: N/A;  . HERNIA REPAIR    . IR FLUORO GUIDE CV LINE RIGHT  12/19/2018  . LEFT HEART CATHETERIZATION WITH CORONARY ANGIOGRAM N/A 09/29/2014  Procedure: LEFT HEART CATHETERIZATION WITH CORONARY ANGIOGRAM;  Surgeon: Clent Demark, MD;  Location[facility] CATH LAB;  Service: Cardiovascular;  Laterality: N/A;  . PORTA CATH INSERTION N/A 01/02/2019   Procedure: PORTA CATH INSERTION;  Surgeon: Algernon Huxley, MD;  Location: [place] CV LAB;  Service: Cardiovascular;  Laterality: N/A;  . PORTACATH PLACEMENT Right 10/28/2018   Procedure: INSERTION PORT-A-CATH;  Surgeon:  Jules Husbands, MD;  Location: [facility] ORS;  Service: General;  Laterality: Right;    Prior to Admission medications   Medication Sig Start Date End Date Taking? Authorizing Provider  atorvastatin (LIPITOR) 80 MG tablet Take 0.5 tablets (40 mg total) by mouth daily at 6 PM. 01/02/16   Plonk, Gwyndolyn Saxon, MD  CALCIUM PO Take 1 tablet by mouth daily.     [provider]  Cholecalciferol (VITAMIN D) 50 MCG (2000 UT) CAPS Take 1 capsule by mouth daily.    [provider]  dexamethasone (DECADRON) 4 MG tablet Take 1 tablet (4 mg total) by mouth daily. 03/30/19   [name], MD  diphenhydrAMINE-zinc acetate (BENADRYL EXTRA STRENGTH) cream Apply 1 application topically 3 (three) times daily as needed for itching. 05/18/19   Earlie Server, MD  fentaNYL (DURAGESIC) 25 MCG/HR Place 1 patch onto the skin every 3 (three) days. 12/29/19   Borders, Kirt Boys, NP  HYDROcodone-acetaminophen (NORCO/VICODIN) 5-325 MG tablet Take 1 tablet by mouth daily as needed for moderate pain. 12/29/19   Borders, Kirt Boys, NP  lidocaine-prilocaine (EMLA) cream Apply to affected area once 10/27/18   Earlie Server, MD  metoprolol tartrate (LOPRESSOR) 25 MG tablet Take 12.5 mg by mouth 2 (two) times daily.    [provider]  nitroGLYCERIN (NITROSTAT) 0.4 MG SL tablet Place 1 tablet (0.4 mg total) under the tongue every 5 (five) minutes x 3 doses as needed for chest pain. 10/02/14   Charolette Forward, MD  omeprazole (PRILOSEC) 40 MG capsule TAKE ONE CAPSULE BY MOUTH DAILY 08/29/19   Earlie Server, MD  ondansetron (ZOFRAN) 8 MG tablet Take 1 tablet (8 mg total) by mouth 2 (two) times daily as needed for refractory nausea / vomiting. Start on day 3 after carboplatin chemo. 10/27/18   Earlie Server, MD  polyethylene glycol [facility] / Floria Raveling) packet Take 17 g by mouth daily as needed.     [provider]  predniSONE (DELTASONE) 20 MG tablet Take 1 tablet (20 mg total) by mouth daily with breakfast. 10/30/19   Earlie Server, MD    prochlorperazine (COMPAZINE) 10 MG tablet TAKE 1 TABLET BY MOUTH EVERY 6 HOURS AS NEEDED FOR NAUSEA OR VOMITING 04/24/19   Earlie Server, MD  sertraline (ZOLOFT) 50 MG tablet Take 1.5 tablets (75 mg total) by mouth daily. 01/01/20   Borders, Kirt Boys, NP  triamterene-hydrochlorothiazide (DYAZIDE) 37.5-25 MG capsule Take 1 each (1 capsule total) by mouth daily as needed. 05/01/19   Glean Hess, MD  vitamin B-12 (CYANOCOBALAMIN) 1000 MCG tablet Take 1,000 mcg by mouth daily.    [provider]    Allergies Patient has no known allergies.  Family History  Problem Relation Age of Onset  . Alzheimer's disease Mother   . Colon cancer Father   . Breast cancer Neg Hx     Social History Social History   Tobacco Use  . Smoking status: Former Smoker    Packs/day: 1.00    Years: 39.00    Pack years: 39.00    Types: Cigarettes    Start date: 12/21/1978  Quit date: 10/16/2018    Years since quitting: 1.2  . Smokeless tobacco: Never Used  Substance Use Topics  . Alcohol use: Yes    Alcohol/week: 0.0 standard drinks    Comment: occassional - approx 1 every 2 weeks   . Drug use: No    Review of Systems  Constitutional: No fever/chills.  Positive for generalized weakness. Eyes: No visual changes. ENT: No sore throat. Cardiovascular: Positive for chest pain. Respiratory: Denies shortness of breath. Gastrointestinal: No abdominal pain.  No nausea, no vomiting.  No diarrhea.  No constipation. Genitourinary: Negative for dysuria. Musculoskeletal: Negative for back pain. Skin: Negative for rash.  Positive for skin tear and scalp wound. Neurological: Negative for headaches, focal weakness or numbness.  ____________________________________________   PHYSICAL EXAM:  VITAL SIGNS: ED Triage Vitals  Enc Vitals Group     BP 01/19/20 1349 (!) 131/111     Pulse Rate 01/19/20 1346 81     Resp 01/19/20 1346 16     Temp --      Temp src --      SpO2 01/19/20 1346 99 %     Weight  01/19/20 1350 149 lb (67.6 kg)     Height 01/19/20 1350 5\' 6"  (1.676 m)     Head Circumference --      Peak Flow --      Pain Score 01/19/20 1348 5     Pain Loc --      Pain Edu? --      Excl. in [place]? --     Constitutional: Alert and oriented. Eyes: Conjunctivae are normal. Head: Left frontal scalp ecchymosis with central wound that appears to be well-healing, no hematomas or step-offs noted. Nose: No congestion/rhinnorhea. Mouth/Throat: Mucous membranes are moist. Neck: Normal ROM, no midline cervical spine tenderness. Cardiovascular: Normal rate, regular rhythm. Grossly normal heart sounds.  Diffuse chest wall tenderness to palpation. Respiratory: Normal respiratory effort.  No retractions. Lungs CTAB. Gastrointestinal: Soft and nontender. No distention. Genitourinary: deferred Musculoskeletal: No lower extremity tenderness nor edema.  Tenderness to palpation at left wrist, otherwise no extremity bony tenderness with range of motion intact. Neurologic:  Normal speech and language. No gross focal neurologic deficits are appreciated. Skin:  Skin is warm, dry and intact. No rash noted.  Skin tear to proximal left arm with adherent overlying dry dressing. Psychiatric: Mood and affect are normal. Speech and behavior are normal.  ____________________________________________   LABS (all labs ordered are listed, but only abnormal results are displayed)  Labs Reviewed  BASIC METABOLIC PANEL  CBC WITH DIFFERENTIAL/PLATELET  URINALYSIS, COMPLETE (UACMP) WITH MICROSCOPIC  TROPONIN I (HIGH SENSITIVITY)   ____________________________________________  EKG  ED ECG REPORT I, Blake Divine, the attending physician, personally viewed and interpreted this ECG.   Date: 01/19/2020  EKG Time: 13:52  Rate: 80  Rhythm: normal sinus rhythm  Axis: Normal  Intervals:none  ST&T Change: None   PROCEDURES  Procedure(s) performed (including Critical  Care):  Procedures   ____________________________________________   INITIAL IMPRESSION / ASSESSMENT AND PLAN / ED COURSE       74 year old female with history of lung cancer status post chemotherapy, now on maintenance immunotherapy presents to the ED following a fall downstairs 2 days prior.  She now primarily complains of chest wall pain, but additionally has pain at her left wrist as well as scalp wound and skin tear to left proximal arm.  Will check CT images of head, C-spine, chest, abdomen and pelvis.  Given her ongoing  generalized weakness, will also screen labs and UA, but vitals are so far reassuring.  EKG without acute ischemic changes, read by computer as STEMI but there is no ST elevation noted by my read.  She has skin tear to proximal left arm as well as small wound to left frontal scalp, now too far out from injury for repair of either these.  We will plan to washout the wounds and place patient on antibiotics, plan to update tetanus.  Doubt cardiac etiology of her chest pain given onset after traumatic injury.  Patient turned over to oncoming provider pending results.      ____________________________________________   FINAL CLINICAL IMPRESSION(S) / ED DIAGNOSES  Final diagnoses:  Fall, initial encounter  Chest wall pain     ED Discharge Orders    None       Note:  This document was prepared using Dragon voice recognition software and may include unintentional dictation errors.   Blake Divine, MD 01/19/20 [phone]

## 2020-01-19 NOTE — ED Notes (Signed)
Pt assisted to toilet to urinate

## 2020-01-19 NOTE — ED Provider Notes (Signed)
[facility]    CSN: [id number] Arrival date & time: 01/19/20  1212      History   Chief Complaint Chief Complaint  Patient presents with  . Fall    at home, 01/17/20    HPI Kristi [name] is a 74 y.o. female.   74 yo female with a h/o CAD (S/P MI, stent), metastatic lung cancer (s/p chemotherapy) presents with a c/o headache and chest pains since fall at home 2 days ago. States she fell down 5 concrete steps face forward and hitting her head. Reports loss of consciousness but unknown how long of a period. Currently reports headache and chest pain to the center of her chest.   Fall    Past Medical History:  Diagnosis Date  . Acute MI, inferoposterior wall ([place]) 09/30/2014  . Claustrophobia   . Coronary artery disease   . Hypercholesteremia   . Hypertension   . MI, old   . 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 Encounter for antineoplastic immunotherapy 09/14/2019  . Memory loss 09/14/2019  . Other fatigue 09/14/2019  . Cancer of hilus of left lung ([place]) 06/08/2019  . Dehydration 01/30/2019  . Acute pyelonephritis 01/24/2019  . Anemia due to antineoplastic chemotherapy 01/09/2019  . Encounter for antineoplastic chemotherapy 11/02/2018  . Cough 10/28/2018  . Dysphagia 10/28/2018  . Decrease in appetite 10/28/2018  . Goals of care, counseling/discussion 10/28/2018  . 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 Grade I diastolic dysfunction 22/01/5426  . Localized edema 08/29/2018  . Cholecystitis 08/13/2018  . COPD with exacerbation ([place]) 08/11/2018  . Osteopenia determined by x-ray 05/19/2018  . Prediabetes 02/08/2018  . Hx of fracture of radius 08/18/2016  . Vitamin D deficiency 01/07/2016  . Hyperlipidemia 01/02/2016  . Hypertension 01/02/2016  . Chronic pain 01/02/2016  . Obesity (BMI 30.0-34.9) 01/02/2016  . Coronary artery disease with stable angina pectoris ([place])  01/02/2016  . IBS (irritable bowel syndrome) 01/02/2016  . FH: colon cancer 01/02/2016  . DDD (degenerative disc disease), cervical 01/02/2016  . Primary osteoarthritis of left knee 01/02/2016  . Tobacco use disorder 01/02/2016    Past Surgical History:  Procedure Laterality Date  . APPENDECTOMY     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  . BLADDER NECK SUSPENSION    . CHOLECYSTECTOMY N/A 08/14/2018   Procedure: LAPAROSCOPIC CHOLECYSTECTOMY;  Surgeon: Jules Husbands, MD;  Location: [facility] ORS;  Service: General;  Laterality: N/A;  . CHOLECYSTECTOMY  11/2018  . CORONARY STENT PLACEMENT    . ENDOBRONCHIAL ULTRASOUND N/A 10/21/2018   Procedure: ENDOBRONCHIAL ULTRASOUND;  Surgeon: Laverle Hobby, MD;  Location: [facility] ORS;  Service: Pulmonary;  Laterality: N/A;  . HERNIA REPAIR    . IR FLUORO GUIDE CV LINE RIGHT  12/19/2018  . LEFT HEART CATHETERIZATION WITH CORONARY ANGIOGRAM N/A 09/29/2014   Procedure: LEFT HEART CATHETERIZATION WITH CORONARY ANGIOGRAM;  Surgeon: Clent Demark, MD;  Location: [facility] CATH LAB;  Service: Cardiovascular;  Laterality: N/A;  . PORTA CATH INSERTION N/A 01/02/2019   Procedure: PORTA CATH INSERTION;  Surgeon: Algernon Huxley, MD;  Location: [place] CV LAB;  Service: Cardiovascular;  Laterality: N/A;  . PORTACATH PLACEMENT Right 10/28/2018   Procedure: INSERTION PORT-A-CATH;  Surgeon: Jules Husbands, MD;  Location: [facility] ORS;  Service: General;  Laterality: Right;    OB History   No obstetric history on file.  Home Medications    Prior to Admission medications   Medication Sig Start Date End Date Taking? Authorizing Provider  atorvastatin (LIPITOR) 80 MG tablet Take 0.5 tablets (40 mg total) by mouth daily at 6 PM. 01/02/16  Yes Plonk, Gwyndolyn Saxon, MD  CALCIUM PO Take 1 tablet by mouth daily.    Yes [provider]  Cholecalciferol (VITAMIN D) 50 MCG (2000 UT) CAPS Take 1 capsule by mouth daily.   Yes [provider]  dexamethasone (DECADRON) 4 MG  tablet Take 1 tablet (4 mg total) by mouth daily. 03/30/19  Yes Chrystal, Eulas Post, MD  diphenhydrAMINE-zinc acetate (BENADRYL EXTRA STRENGTH) cream Apply 1 application topically 3 (three) times daily as needed for itching. 05/18/19  Yes Earlie Server, MD  fentaNYL (DURAGESIC) 25 MCG/HR Place 1 patch onto the skin every 3 (three) days. 12/29/19  Yes Borders, Kirt Boys, NP  HYDROcodone-acetaminophen (NORCO/VICODIN) 5-325 MG tablet Take 1 tablet by mouth daily as needed for moderate pain. 12/29/19  Yes Borders, Kirt Boys, NP  lidocaine-prilocaine (EMLA) cream Apply to affected area once 10/27/18  Yes Earlie Server, MD  metoprolol tartrate (LOPRESSOR) 25 MG tablet Take 12.5 mg by mouth 2 (two) times daily.   Yes [provider]  nitroGLYCERIN (NITROSTAT) 0.4 MG SL tablet Place 1 tablet (0.4 mg total) under the tongue every 5 (five) minutes x 3 doses as needed for chest pain. 10/02/14  Yes Charolette Forward, MD  omeprazole (PRILOSEC) 40 MG capsule TAKE ONE CAPSULE BY MOUTH DAILY 08/29/19  Yes Earlie Server, MD  ondansetron (ZOFRAN) 8 MG tablet Take 1 tablet (8 mg total) by mouth 2 (two) times daily as needed for refractory nausea / vomiting. Start on day 3 after carboplatin chemo. 10/27/18  Yes Earlie Server, MD  polyethylene glycol [facility] / Floria Raveling) packet Take 17 g by mouth daily as needed.    Yes [provider]  predniSONE (DELTASONE) 20 MG tablet Take 1 tablet (20 mg total) by mouth daily with breakfast. 10/30/19  Yes Earlie Server, MD  prochlorperazine (COMPAZINE) 10 MG tablet TAKE 1 TABLET BY MOUTH EVERY 6 HOURS AS NEEDED FOR NAUSEA OR VOMITING 04/24/19  Yes Earlie Server, MD  sertraline (ZOLOFT) 50 MG tablet Take 1.5 tablets (75 mg total) by mouth daily. 01/01/20  Yes Borders, Kirt Boys, NP  triamterene-hydrochlorothiazide (DYAZIDE) 37.5-25 MG capsule Take 1 each (1 capsule total) by mouth daily as needed. 05/01/19  Yes Glean Hess, MD  vitamin B-12 (CYANOCOBALAMIN) 1000 MCG tablet Take 1,000 mcg by mouth daily.   Yes  [provider]    Family History Family History  Problem Relation Age of Onset  . Alzheimer's disease Mother   . Colon cancer Father   . Breast cancer Neg Hx     Social History Social History   Tobacco Use  . Smoking status: Former Smoker    Packs/day: 1.00    Years: 39.00    Pack years: 39.00    Types: Cigarettes    Start date: 12/21/1978    Quit date: 10/16/2018    Years since quitting: 1.2  . Smokeless tobacco: Never Used  Substance Use Topics  . Alcohol use: Yes    Alcohol/week: 0.0 standard drinks    Comment: occassional - approx 1 every 2 weeks   . Drug use: No     Allergies   Patient has no known allergies.   Review of Systems Review of Systems   Physical Exam Triage Vital Signs ED Triage Vitals  Enc Vitals Group  BP 01/19/20 1227 98/66     Pulse Rate 01/19/20 1227 80     Resp --      Temp 01/19/20 1227 98.4 F (36.9 C)     Temp Source 01/19/20 1227 Oral     SpO2 01/19/20 1227 96 %     Weight 01/19/20 1234 149 lb (67.6 kg)     Height 01/19/20 1234 5\' 6"  (1.676 m)     Head Circumference --      Peak Flow --      Pain Score 01/19/20 1231 5     Pain Loc --      Pain Edu? --      Excl. in [place]? --    No data found.  Updated Vital Signs BP 98/66 (BP Location: Right Arm)   Pulse 80   Temp 98.4 F (36.9 C) (Oral)   Ht 5\' 6"  (1.676 m)   Wt 67.6 kg   SpO2 96%   BMI 24.05 kg/m   Visual Acuity Right Eye Distance:   Left Eye Distance:   Bilateral Distance:    Right Eye Near:   Left Eye Near:    Bilateral Near:     Physical Exam Vitals and nursing note reviewed.  Constitutional:      General: She is not in acute distress.    Appearance: She is not toxic-appearing or diaphoretic.  HENT:     Head: Contusion and laceration present.     Comments: Large hematoma to left forehead area; tenderness to palpation    Mouth/Throat:     Pharynx: Oropharynx is clear.  Eyes:     Extraocular Movements: Extraocular movements intact.      Pupils: Pupils are equal, round, and reactive to light.  Cardiovascular:     Rate and Rhythm: Normal rate.     Heart sounds: Normal heart sounds.  Pulmonary:     Effort: Pulmonary effort is normal. No respiratory distress.  Musculoskeletal:     Cervical back: Neck supple. No rigidity or tenderness.  Neurological:     General: No focal deficit present.     Mental Status: She is alert.     Cranial Nerves: No cranial nerve deficit.  Psychiatric:        Mood and Affect: Mood normal.      UC Treatments / Results  Labs (all labs ordered are listed, but only abnormal results are displayed) Labs Reviewed - No data to display  EKG   Radiology No results found.  Procedures Procedures (including critical care time)  Medications Ordered in UC Medications - No data to display  Initial Impression / Assessment and Plan / UC Course  I have reviewed the triage vital signs and the nursing notes.  Pertinent labs & imaging results that were available during my care of the patient were reviewed by me and considered in my medical decision making (see chart for details).      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 diagnosis reviewed with patient; due to injury, symptoms and patient medical history, recommend patient go to Emergency Department for further evaluation and management; patient in stable condition, transported by EMS. Report called to charge RN at [facility] ED.  PDMP not reviewed this encounter.   Norval Gable, MD 01/19/20 1320

## 2020-01-19 NOTE — ED Notes (Signed)
Pt port accessed with Rush Landmark, RN

## 2020-01-19 NOTE — Discharge Instructions (Signed)
Thank you for letting us take care of you in the emergency department today.   Please continue to take any regular, prescribed medications.   Please follow up with: - Your primary care doctor or oncology to review your ER visit and follow up on your symptoms.  - Spine doctor, information below  Please return to the ER for any new or worsening symptoms.

## 2020-01-19 NOTE — ED Provider Notes (Signed)
Labs without actionable derangements. Imaging below:  CT head/CS: IMPRESSION:  1. No CT evidence for acute intracranial abnormality.  2. Extensive white matter disease, felt consistent with history of whole brain radiation.  3. Degenerative changes of the cervical spine. No acute osseous abnormality  4. Emphysema   CT C/A/P: IMPRESSION:  1. Severe compression fracture of T7 vertebral body, likely subacute in age.  2. No evidence of thoracic aortic injury or mediastinal hematoma.  3. No evidence of abdominal organ injury or hemoperitoneum.  4. Moderate ventral abdominal wall hernia containing multiple small bowel loops.  5. Colonic diverticulosis, without radiographic evidence of diverticulitis.  6. Aortic and coronary artery atherosclerosis.   XR wrist: IMPRESSION:  No acute fracture.   Imaging negative for any acute traumatic injuries related to her fall.  Notable for subacute compression fracture of T7, which patient is already aware of.  Pain is being managed by her oncologist.  Will give information/referral for follow-up with spine per her request.  Otherwise, she is stable for discharge with outpatient follow-up.  Patient voices understanding and is comfortable with the plan and discharge.   Lilia Pro., MD 01/19/20 Dorthula Perfect

## 2020-01-19 NOTE — ED Triage Notes (Signed)
Pt presents with c/o s/p fall at home this past Wednesday. She fell down 5 concrete steps face-forward. She reports positive LOC. She has abrasions to her face, left hand and left upper arm along with bruising to the right arm. She did hit her forehead and there is a puncture wound above the left eye along with bruising. She reports pain to her sternum and pain with inhalation.

## 2020-01-19 NOTE — ED Triage Notes (Signed)
Pt fell down 5 concrete stairs on Wednesday.  No LOC. No blood thinners. Was not seen on day of initial injury.  Bruising and abrasions to left forehead and periorbital area.  Small puncture like wound to left forehead.  C/o mid chest pain that is not worse with movement but worse with palpation. Does have hx of MI.  Pt has had chemo and RT.  Still receiving maintenance chemo.

## 2020-01-19 NOTE — ED Notes (Signed)
Port flushed with heparin and deaccessed. Guaze and tegaderm applied.

## 2020-01-22 ENCOUNTER — Inpatient Hospital Stay: Payer: Medicare Other | Admitting: Hospice and Palliative Medicine

## 2020-01-22 ENCOUNTER — Encounter: Payer: Self-pay | Admitting: Oncology

## 2020-01-22 ENCOUNTER — Other Ambulatory Visit: Payer: Self-pay | Admitting: *Deleted

## 2020-01-22 ENCOUNTER — Inpatient Hospital Stay (HOSPITAL_BASED_OUTPATIENT_CLINIC_OR_DEPARTMENT_OTHER): Payer: Medicare Other | Admitting: Oncology

## 2020-01-22 ENCOUNTER — Inpatient Hospital Stay: Payer: Medicare Other

## 2020-01-22 ENCOUNTER — Other Ambulatory Visit: Payer: Self-pay

## 2020-01-22 ENCOUNTER — Inpatient Hospital Stay: Payer: Medicare Other | Attending: Oncology | Admitting: *Deleted

## 2020-01-22 ENCOUNTER — Ambulatory Visit: Payer: Medicare Other

## 2020-01-22 VITALS — BP 119/83 | HR 88 | Temp 97.5°F | Resp 18 | Wt 145.7 lb

## 2020-01-22 DIAGNOSIS — R296 Repeated falls: Secondary | ICD-10-CM | POA: Diagnosis not present

## 2020-01-22 DIAGNOSIS — C3490 Malignant neoplasm of unspecified part of unspecified bronchus or lung: Secondary | ICD-10-CM

## 2020-01-22 DIAGNOSIS — Z79899 Other long term (current) drug therapy: Secondary | ICD-10-CM | POA: Insufficient documentation

## 2020-01-22 DIAGNOSIS — C7951 Secondary malignant neoplasm of bone: Secondary | ICD-10-CM | POA: Diagnosis not present

## 2020-01-22 DIAGNOSIS — M8580 Other specified disorders of bone density and structure, unspecified site: Secondary | ICD-10-CM | POA: Diagnosis not present

## 2020-01-22 DIAGNOSIS — Z5112 Encounter for antineoplastic immunotherapy: Secondary | ICD-10-CM | POA: Insufficient documentation

## 2020-01-22 DIAGNOSIS — Z95828 Presence of other vascular implants and grafts: Secondary | ICD-10-CM

## 2020-01-22 DIAGNOSIS — Z87891 Personal history of nicotine dependence: Secondary | ICD-10-CM | POA: Insufficient documentation

## 2020-01-22 DIAGNOSIS — R413 Other amnesia: Secondary | ICD-10-CM

## 2020-01-22 DIAGNOSIS — S22000A Wedge compression fracture of unspecified thoracic vertebra, initial encounter for closed fracture: Secondary | ICD-10-CM | POA: Diagnosis not present

## 2020-01-22 DIAGNOSIS — E860 Dehydration: Secondary | ICD-10-CM

## 2020-01-22 LAB — COMPREHENSIVE METABOLIC PANEL
ALT: 12 U/L (ref 0–44)
AST: 15 U/L (ref 15–41)
Albumin: 4.1 g/dL (ref 3.5–5.0)
Alkaline Phosphatase: 62 U/L (ref 38–126)
Anion gap: 11 (ref 5–15)
BUN: 14 mg/dL (ref 8–23)
CO2: 22 mmol/L (ref 22–32)
Calcium: 9.4 mg/dL (ref 8.9–10.3)
Chloride: 102 mmol/L (ref 98–111)
Creatinine, Ser: 0.87 mg/dL (ref 0.44–1.00)
GFR calc Af Amer: >60 mL/min (ref >60)
GFR calc non Af Amer: >60 mL/min (ref >60)
Glucose, Bld: 106 mg/dL — ABNORMAL HIGH (ref 70–99)
Potassium: 3.7 mmol/L (ref 3.5–5.1)
Sodium: 135 mmol/L (ref 135–145)
Total Bilirubin: 0.6 mg/dL (ref 0.3–1.2)
Total Protein: 7.3 g/dL (ref 6.5–8.1)

## 2020-01-22 LAB — CBC WITH DIFFERENTIAL/PLATELET
Abs Immature Granulocytes: 0.05 10*3/uL (ref 0.00–0.07)
Basophils Absolute: 0 10*3/uL (ref 0.0–0.1)
Basophils Relative: 1 %
Eosinophils Absolute: 0.3 10*3/uL (ref 0.0–0.5)
Eosinophils Relative: 5 %
HCT: 37.3 % (ref 36.0–46.0)
Hemoglobin: 12.2 g/dL (ref 12.0–15.0)
Immature Granulocytes: 1 %
Lymphocytes Relative: 17 %
Lymphs Abs: 1.1 10*3/uL (ref 0.7–4.0)
MCH: 28.9 pg (ref 26.0–34.0)
MCHC: 32.7 g/dL (ref 30.0–36.0)
MCV: 88.4 fL (ref 80.0–100.0)
Monocytes Absolute: 0.8 10*3/uL (ref 0.1–1.0)
Monocytes Relative: 13 %
Neutro Abs: 4 10*3/uL (ref 1.7–7.7)
Neutrophils Relative %: 63 %
Platelets: 173 10*3/uL (ref 150–400)
RBC: 4.22 MIL/uL (ref 3.87–5.11)
RDW: 13.1 % (ref 11.5–15.5)
WBC: 6.3 10*3/uL (ref 4.0–10.5)
nRBC: 0 % (ref 0.0–0.2)

## 2020-01-22 LAB — TSH: TSH: 1.218 u[IU]/mL (ref 0.350–4.500)

## 2020-01-22 MED ORDER — HEPARIN SOD (PORK) LOCK FLUSH 100 UNIT/ML IV SOLN
500.0000 [IU] | Freq: Once | INTRAVENOUS | Status: AC | PRN
  Administered 2020-01-22: 12:00:00 500 [IU]
  Filled 2020-01-22: qty 5

## 2020-01-22 MED ORDER — ZOLEDRONIC ACID 4 MG/5ML IV CONC
3.5000 mg | Freq: Once | INTRAVENOUS | Status: AC
  Administered 2020-01-22: 3.5 mg via INTRAVENOUS
  Filled 2020-01-22: qty 4.38

## 2020-01-22 MED ORDER — SODIUM CHLORIDE 0.9 % IV SOLN
1200.0000 mg | Freq: Once | INTRAVENOUS | Status: AC
  Administered 2020-01-22: 11:00:00 1200 mg via INTRAVENOUS
  Filled 2020-01-22: qty 20

## 2020-01-22 MED ORDER — DEXAMETHASONE SODIUM PHOSPHATE 10 MG/ML IJ SOLN
10.0000 mg | Freq: Once | INTRAMUSCULAR | Status: AC
  Administered 2020-01-22: 10 mg via INTRAVENOUS
  Filled 2020-01-22: qty 1

## 2020-01-22 MED ORDER — SODIUM CHLORIDE 0.9 % IV SOLN
Freq: Once | INTRAVENOUS | Status: AC
  Filled 2020-01-22: qty 250

## 2020-01-22 MED ORDER — SODIUM CHLORIDE 0.9% FLUSH
10.0000 mL | Freq: Once | INTRAVENOUS | Status: AC
  Administered 2020-01-22: 10 mL via INTRAVENOUS
  Filled 2020-01-22: qty 10

## 2020-01-22 MED ORDER — HEPARIN SOD (PORK) LOCK FLUSH 100 UNIT/ML IV SOLN
INTRAVENOUS | Status: AC
  Filled 2020-01-22: qty 5

## 2020-01-22 NOTE — Progress Notes (Signed)
Patient here for follow up. Had a fall last week. Complains of pain to back, chest and head

## 2020-01-22 NOTE — Progress Notes (Signed)
Hematology/Oncology Follow up note [facility] [phone] [phone]   Patient Care Team: Glean Hess, MD as PCP - General (Internal Medicine) Charolette Forward, MD as Consulting Physician (Cardiology) Telford Nab, RN as Registered Nurse  REFERRING PROVIDER: Dr. Felicie Morn REASON FOR VISIT:  Follow-up for small cell lung cancer,   HISTORY OF PRESENTING ILLNESS:  Kristi [name] is a  74 y.o.  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  10/20/2018 CT chest with contrast showed large mediastinal mass involving both hilar, left greater than right, consistent with lung carcinoma, The mass causes narrowing of the left mainstem bronchus with resultant volume loss on the left and a mediastinal shift to the left.  Moderate size left pleural effusion. 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  # MRI brain negative.  # Nov 2019- Jan 2020 s/p 4 cycles of Carbo/Etoposide/Tecentriq #  01/24/2019 interim CT scan done which showed continued positive response to therapy with continued reduction in mediastinal adenopathy.  No residual measurable left lung mass. CT findings of acute emphysematous cystitis. Urology did not feel that patient has pyelonephritis and recommend treatment with antibiotics because patient's immunocompromise. Patient finished treatment.   # Chest and whole brain radiation finished in May 2020 # 04/25/2019 resume Tecentriq every 3 weeks.  INTERVAL HISTORY Kristi [name] is a 74 y.o. female who has above history reviewed by me today presents for evaluation prior to immunotherapy for treatment of extensive small cell lung cancer. Currently patient is on immunotherapy maintenance. Patient is accompanied by her husband today. Patient had a fall from her stairs on 01/19/2020. In the emergency room she  had CT chest abdomen pelvis CT, CT head without contrast, CT cervical spine without contrast. No CT evidence for acute intracranial abnormality, degenerative changes of cervical spine..  No CT evidence of acute thoracic abdomen injury.  Severe compression fracture of T7, subacute. Patient takes fentanyl patch 25 MCG/hr, replace patch every 3 days,  She also takes Norco 5/325 1 tablet as needed for her pain.  She reports that her pain is well controlled at this point.   Review of Systems  Constitutional: Positive for fatigue. Negative for appetite change, chills and fever.  HENT:   Negative for hearing loss and voice change.   Eyes: Negative for eye problems.  Respiratory: Negative for chest tightness, cough and shortness of breath.   Cardiovascular: Negative for chest pain.  Gastrointestinal: Negative for abdominal distention, abdominal pain, blood in stool, diarrhea and nausea.  Endocrine: Negative for hot flashes.  Genitourinary: Negative for difficulty urinating and frequency.   Musculoskeletal: Positive for back pain. Negative for arthralgias.  Skin: Negative for itching and rash.  Neurological: Negative for extremity weakness.  Hematological: Negative for adenopathy.  Psychiatric/Behavioral: Negative for confusion and depression. The patient is not nervous/anxious.        Forgetful     MEDICAL HISTORY:  Past Medical History:  Diagnosis Date  . Acute MI, inferoposterior wall ([place]) 09/30/2014  . Claustrophobia   . Coronary artery disease   . Hypercholesteremia   . Hypertension   . MI, old   . 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 [facility]) 10/27/2018    SURGICAL HISTORY: Past Surgical History:  Procedure Laterality Date  . APPENDECTOMY     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  . BLADDER NECK SUSPENSION    . CHOLECYSTECTOMY N/A 08/14/2018  Procedure: LAPAROSCOPIC CHOLECYSTECTOMY;  Surgeon: Jules Husbands, MD;  Location: [facility] ORS;  Service: General;  Laterality: N/A;  . CHOLECYSTECTOMY   11/2018  . CORONARY STENT PLACEMENT    . ENDOBRONCHIAL ULTRASOUND N/A 10/21/2018   Procedure: ENDOBRONCHIAL ULTRASOUND;  Surgeon: Laverle Hobby, MD;  Location: [facility] ORS;  Service: Pulmonary;  Laterality: N/A;  . HERNIA REPAIR    . IR FLUORO GUIDE CV LINE RIGHT  12/19/2018  . LEFT HEART CATHETERIZATION WITH CORONARY ANGIOGRAM N/A 09/29/2014   Procedure: LEFT HEART CATHETERIZATION WITH CORONARY ANGIOGRAM;  Surgeon: Clent Demark, MD;  Location: [facility] CATH LAB;  Service: Cardiovascular;  Laterality: N/A;  . PORTA CATH INSERTION N/A 01/02/2019   Procedure: PORTA CATH INSERTION;  Surgeon: Algernon Huxley, MD;  Location: [facility] CV LAB;  Service: Cardiovascular;  Laterality: N/A;  . PORTACATH PLACEMENT Right 10/28/2018   Procedure: INSERTION PORT-A-CATH;  Surgeon: Jules Husbands, MD;  Location: [facility] ORS;  Service: General;  Laterality: Right;    SOCIAL HISTORY: Social History   Socioeconomic History  . Marital status: Married    Spouse name: [name]   . Number of children: 3  . Years of education: Not on file  . Highest education level: Not on file  Occupational History  . Occupation: Retired    Comment: Chief Technology Officer   Tobacco Use  . Smoking status: Former Smoker    Packs/day: 1.00    Years: 39.00    Pack years: 39.00    Types: Cigarettes    Start date: 12/21/1978    Quit date: 10/16/2018    Years since quitting: 1.2  . Smokeless tobacco: Never Used  Substance and Sexual Activity  . Alcohol use: Yes    Alcohol/week: 0.0 standard drinks    Comment: occassional - approx 1 every 2 weeks   . Drug use: No  . Sexual activity: Yes    Birth control/protection: None  Other Topics Concern  . Not on file  Social History Narrative  . Not on file   Social Determinants of Health   Financial Resource Strain:   . Difficulty of Paying Living Expenses: Not on file  Food Insecurity:   . Worried About Charity fundraiser in the Last Year: Not on file  . Ran Out of Food in the Last  Year: Not on file  Transportation Needs:   . Lack of Transportation (Medical): Not on file  . Lack of Transportation (Non-Medical): Not on file  Physical Activity:   . Days of Exercise per Week: Not on file  . Minutes of Exercise per Session: Not on file  Stress:   . Feeling of Stress : Not on file  Social Connections:   . Frequency of Communication with Friends and Family: Not on file  . Frequency of Social Gatherings with Friends and Family: Not on file  . Attends Religious Services: Not on file  . Active Member of Clubs or Organizations: Not on file  . Attends Archivist Meetings: Not on file  . Marital Status: Not on file  Intimate Partner Violence:   . Fear of Current or Ex-Partner: Not on file  . Emotionally Abused: Not on file  . Physically Abused: Not on file  . Sexually Abused: Not on file    FAMILY HISTORY: Family History  Problem Relation Age of Onset  . Alzheimer's disease Mother   . Colon cancer Father   . Breast cancer Neg Hx     ALLERGIES:  has No Known Allergies.  MEDICATIONS:  Current Outpatient Medications  Medication Sig Dispense Refill  . atorvastatin (LIPITOR) 80 MG tablet Take 0.5 tablets (40 mg total) by mouth daily at 6 PM. 30 tablet 3  . CALCIUM PO Take 1 tablet by mouth daily.     . Cholecalciferol (VITAMIN D) 50 MCG (2000 UT) CAPS Take 1 capsule by mouth daily.    [name] Kitchen dexamethasone (DECADRON) 4 MG tablet Take 1 tablet (4 mg total) by mouth daily. 25 tablet 0  . diphenhydrAMINE-zinc acetate (BENADRYL EXTRA STRENGTH) cream Apply 1 application topically 3 (three) times daily as needed for itching. 28.4 g 0  . fentaNYL (DURAGESIC) 25 MCG/HR Place 1 patch onto the skin every 3 (three) days. 10 patch 0  . HYDROcodone-acetaminophen (NORCO/VICODIN) 5-325 MG tablet Take 1 tablet by mouth daily as needed for moderate pain. 30 tablet 0  . lidocaine-prilocaine (EMLA) cream Apply to affected area once 30 g 3  . metoprolol tartrate (LOPRESSOR) 25 MG  tablet Take 12.5 mg by mouth 2 (two) times daily.    . nitroGLYCERIN (NITROSTAT) 0.4 MG SL tablet Place 1 tablet (0.4 mg total) under the tongue every 5 (five) minutes x 3 doses as needed for chest pain. 25 tablet 12  . omeprazole (PRILOSEC) 40 MG capsule TAKE ONE CAPSULE BY MOUTH DAILY 90 capsule 1  . ondansetron (ZOFRAN) 8 MG tablet Take 1 tablet (8 mg total) by mouth 2 (two) times daily as needed for refractory nausea / vomiting. Start on day 3 after carboplatin chemo. 30 tablet 1  . polyethylene glycol (MIRALAX / GLYCOLAX) packet Take 17 g by mouth daily as needed.     . predniSONE (DELTASONE) 20 MG tablet Take 1 tablet (20 mg total) by mouth daily with breakfast. 5 tablet 0  . prochlorperazine (COMPAZINE) 10 MG tablet TAKE 1 TABLET BY MOUTH EVERY 6 HOURS AS NEEDED FOR NAUSEA OR VOMITING 270 tablet 1  . sertraline (ZOLOFT) 50 MG tablet Take 1.5 tablets (75 mg total) by mouth daily. 45 tablet 1  . triamterene-hydrochlorothiazide (DYAZIDE) 37.5-25 MG capsule Take 1 each (1 capsule total) by mouth daily as needed. 90 capsule 0  . vitamin B-12 (CYANOCOBALAMIN) 1000 MCG tablet Take 1,000 mcg by mouth daily.     No current facility-administered medications for this visit.   Facility-Administered Medications Ordered in Other Visits  Medication Dose Route Frequency Provider Last Rate Last Admin  . atezolizumab (TECENTRIQ) 1,200 mg in sodium chloride 0.9 % 250 mL chemo infusion  1,200 mg Intravenous Once Earlie Server, MD      . heparin lock flush 100 unit/mL  500 Units Intravenous Once Earlie Server, MD      . heparin lock flush 100 unit/mL  500 Units Intravenous Once Earlie Server, MD      . heparin lock flush 100 unit/mL  500 Units Intracatheter Once PRN Earlie Server, MD      . sodium chloride flush (NS) 0.9 % injection 10 mL  10 mL Intravenous PRN Earlie Server, MD   10 mL at 01/09/19 0820  . sodium chloride flush (NS) 0.9 % injection 10 mL  10 mL Intravenous PRN Earlie Server, MD   10 mL at 01/10/19 1400     PHYSICAL  EXAMINATION: ECOG PERFORMANCE STATUS: 1 - Symptomatic but completely ambulatory Vitals:   01/22/20 0902  BP: 119/83  Pulse: 88  Resp: 18  Temp: (!) 97.5 F (36.4 C)  SpO2: 100%   Filed Weights   01/22/20 0902  Weight: 145 lb 11.2 oz (  66.1 kg)   Physical Exam  Constitutional: She is oriented to person, place, and time. No distress.  HENT:  Head: Normocephalic and atraumatic.  Mouth/Throat: No oropharyngeal exudate.  Eyes: Pupils are equal, round, and reactive to light. EOM are normal. No scleral icterus.  Cardiovascular: Normal rate and regular rhythm.  No murmur heard. Pulmonary/Chest: Effort normal. No respiratory distress. She has no rales. She exhibits no tenderness.  Decreased breath sounds bilaterally, right side breath sound is more diminished.   Abdominal: Soft. She exhibits no distension. There is no abdominal tenderness.  Musculoskeletal:        General: No edema. Normal range of motion.     Cervical back: Normal range of motion and neck supple.  Neurological: She is alert and oriented to person, place, and time. No cranial nerve deficit. She exhibits normal muscle tone. Coordination normal.  Skin: Skin is warm and dry. She is not diaphoretic.  Facial bruising, and abrasion of left forehead  Psychiatric: Affect normal.       LABORATORY DATA:  I have reviewed the data as listed Lab Results  Component Value Date   WBC 6.3 01/22/2020   HGB 12.2 01/22/2020   HCT 37.3 01/22/2020   MCV 88.4 01/22/2020   PLT 173 01/22/2020   Recent Labs    12/11/19 0821 12/11/19 0821 01/01/20 0856 01/19/20 1542 01/22/20 0850  NA 139   < > 138 137 135  K 3.7   < > 3.9 3.7 3.7  CL 105   < > 103 104 102  CO2 25   < > 25 24 22   GLUCOSE 98   < > 107* 103* 106*  BUN 13   < > 18 11 14   CREATININE 0.79   < > 0.75 0.70 0.87  CALCIUM 9.2   < > 9.6 9.3 9.4  GFRNONAA >60   < > >60 >60 >60  GFRAA >60   < > >60 >60 >60  PROT 6.9  --  7.3  --  7.3  ALBUMIN 3.9  --  4.1  --  4.1    AST 14*  --  15  --  15  ALT 12  --  12  --  12  ALKPHOS 65  --  63  --  62  BILITOT 0.6  --  0.7  --  0.6   < > = values in this interval not displayed.    RADIOGRAPHIC STUDIES: I have personally reviewed the radiological images as listed and agreed with the findings in the report.  DG Wrist Complete Left  Result Date: 01/19/2020 CLINICAL DATA:  [place] down 5 stairs 2 days ago with left wrist pain. EXAM: LEFT WRIST - COMPLETE 3+ VIEW COMPARISON:  None. FINDINGS: Mild degenerate change over the radiocarpal joint and first carpometacarpal joints. Mild osteopenia is present. No evidence of acute fracture or dislocation. IMPRESSION: No acute fracture. Electronically Signed   By: Marin Olp M.D.   On: 01/19/2020 15:04   CT Head Wo Contrast  Result Date: 01/19/2020 CLINICAL DATA:  [place] [address] down stairs bruising to the left forehead puncture wound EXAM: CT HEAD WITHOUT CONTRAST CT CERVICAL SPINE WITHOUT CONTRAST TECHNIQUE: Multidetector CT imaging of the head and cervical spine was performed following the standard protocol without intravenous contrast. Multiplanar CT image reconstructions of the cervical spine were also generated. COMPARISON:  MRI 12/01/2019, CT brain 07/27/2016 FINDINGS: CT HEAD FINDINGS Brain: No acute territorial infarction, hemorrhage or intracranial mass is visualized. Extensive white matter hypodensity throughout  the brain, felt consistent with history of whole brain radiation. Mild atrophy. Stable ventricle size Vascular: No hyperdense vessels.  Carotid vascular calcification Skull: Normal. Negative for fracture or focal lesion. Sinuses/Orbits: No acute finding. Other: Small forehead scalp hematoma CT CERVICAL SPINE FINDINGS Alignment: No subluxation.  Facet alignment is normal. Skull base and vertebrae: No acute fracture. No primary bone lesion or focal pathologic process. Soft tissues and spinal canal: No prevertebral fluid or swelling. No visible canal hematoma. Disc levels:  Advanced degenerative changes C4-C5, C5-C6 and C6-C7. Multiple level facet degenerative change. Upper chest: Emphysema Other: None IMPRESSION: 1. No CT evidence for acute intracranial abnormality. 2. Extensive white matter disease, felt consistent with history of whole brain radiation. 3. Degenerative changes of the cervical spine. No acute osseous abnormality 4. Emphysema Electronically Signed   By: Donavan Foil M.D.   On: 01/19/2020 18:25   CT Chest W Contrast  Result Date: 01/19/2020 CLINICAL DATA:  [place] down concrete steps 2 days ago. Blunt chest and abdominal trauma, with pain and tenderness. Initial encounter. EXAM: CT CHEST, ABDOMEN, AND PELVIS WITH CONTRAST TECHNIQUE: Multidetector CT imaging of the chest, abdomen and pelvis was performed following the standard protocol during bolus administration of intravenous contrast. CONTRAST:  170mL OMNIPAQUE IOHEXOL 300 MG/ML  SOLN COMPARISON:  10/23/2019 FINDINGS: CT CHEST FINDINGS Cardiovascular: No evidence of thoracic aortic injury or mediastinal hematoma. No pericardial effusion. Aortic and coronary artery atherosclerosis incidentally noted. Mediastinum/Nodes: No evidence of pneumomediastinum. No masses or pathologically enlarged lymph nodes identified. Lungs/Pleura: No evidence of pulmonary contusion or other infiltrate. Bibasilar atelectasis or scarring noted. No evidence of pneumothorax or hemothorax. Musculoskeletal: A severe compression fracture of the T7 vertebral body is seen. This was not seen on previous study approximately 3 months ago, but shows sclerosis indicating that it is subacute in age. CT ABDOMEN PELVIS FINDINGS Hepatobiliary: No hepatic laceration or mass identified. Prior cholecystectomy. No evidence of biliary obstruction. Pancreas: No parenchymal laceration, mass, or inflammatory changes identified. Spleen: No evidence of splenic laceration. Adrenal/Urinary Tract: No hemorrhage or parenchymal lacerations identified. No evidence of mass  or hydronephrosis. Stomach/Bowel: No evidence of bowel wall thickening or dilatation. No evidence of hemoperitoneum. Diverticulosis is seen mainly involving the sigmoid colon, however there is no evidence of diverticulitis. A moderate ventral abdominal wall hernia is again seen containing the multiple small bowel loops. No evidence of small bowel obstruction or strangulation. Vascular/Lymphatic: No evidence of abdominal aortic injury or retroperitoneal hemorrhage. Aortic atherosclerosis incidentally noted. No pathologically enlarged lymph nodes identified. Reproductive:  No mass or other significant abnormality identified. Other:  None. Musculoskeletal: No acute fractures or suspicious bone lesions identified. IMPRESSION: 1. Severe compression fracture of T7 vertebral body, likely subacute in age. 2. No evidence of thoracic aortic injury or mediastinal hematoma. 3. No evidence of abdominal organ injury or hemoperitoneum. 4. Moderate ventral abdominal wall hernia containing multiple small bowel loops. 5. Colonic diverticulosis, without radiographic evidence of diverticulitis. 6. Aortic and coronary artery atherosclerosis. Electronically Signed   By: Marlaine Hind M.D.   On: 01/19/2020 18:31   CT Cervical Spine Wo Contrast  Result Date: 01/19/2020 CLINICAL DATA:  [place] down stairs bruising to the left forehead puncture wound EXAM: CT HEAD WITHOUT CONTRAST CT CERVICAL SPINE WITHOUT CONTRAST TECHNIQUE: Multidetector CT imaging of the head and cervical spine was performed following the standard protocol without intravenous contrast. Multiplanar CT image reconstructions of the cervical spine were also generated. COMPARISON:  MRI 12/01/2019, CT brain 07/27/2016 FINDINGS: CT HEAD FINDINGS Brain:  No acute territorial infarction, hemorrhage or intracranial mass is visualized. Extensive white matter hypodensity throughout the brain, felt consistent with history of whole brain radiation. Mild atrophy. Stable ventricle size  Vascular: No hyperdense vessels.  Carotid vascular calcification Skull: Normal. Negative for fracture or focal lesion. Sinuses/Orbits: No acute finding. Other: Small forehead scalp hematoma CT CERVICAL SPINE FINDINGS Alignment: No subluxation.  Facet alignment is normal. Skull base and vertebrae: No acute fracture. No primary bone lesion or focal pathologic process. Soft tissues and spinal canal: No prevertebral fluid or swelling. No visible canal hematoma. Disc levels: Advanced degenerative changes C4-C5, C5-C6 and C6-C7. Multiple level facet degenerative change. Upper chest: Emphysema Other: None IMPRESSION: 1. No CT evidence for acute intracranial abnormality. 2. Extensive white matter disease, felt consistent with history of whole brain radiation. 3. Degenerative changes of the cervical spine. No acute osseous abnormality 4. Emphysema Electronically Signed   By: Donavan Foil M.D.   On: 01/19/2020 18:25   MR Brain W Wo Contrast  Result Date: 12/01/2019 CLINICAL DATA:  74 year old female with history of small cell lung cancer. Status post prophylactic cranial radiation completed in May or June this year. Restaging. EXAM: MRI HEAD WITHOUT AND WITH CONTRAST TECHNIQUE: Multiplanar, multiecho pulse sequences of the brain and surrounding structures were obtained without and with intravenous contrast. CONTRAST:  13mL GADAVIST GADOBUTROL 1 MMOL/ML IV SOLN COMPARISON:  Brain MRI 10/28/2018. FINDINGS: Brain: No restricted diffusion to suggest acute infarction. No midline shift, mass effect, evidence of mass lesion, ventriculomegaly, extra-axial collection or acute intracranial hemorrhage. Cervicomedullary junction and pituitary are within normal limits. No abnormal enhancement identified. No dural thickening. Progressed and now widespread bilateral cerebral white matter T2 and FLAIR hyperintensity, involving the deep white matter capsules also. Cerebral volume appears stable. Similar patchy and confluent new or  increased T2 signal in the pons and deep cerebellar nuclei. No cortical encephalomalacia or chronic cerebral blood products identified. Vascular: Major intracranial vascular flow voids are stable, with loss of the distal left vertebral artery flow void as noted in 2019. The major dural venous sinuses are enhancing and appear to be patent. Skull and upper cervical spine: Negative visible cervical spine aside from degenerative changes. Negative visible spinal cord. Visualized bone marrow signal is within normal limits. Sinuses/Orbits: Stable and negative. Other: Trace left mastoid fluid is stable. Visible internal auditory structures appear normal. Scalp and face soft tissues appear negative. IMPRESSION: Sequelae of whole brain radiation. No metastatic disease or acute intracranial abnormality. Electronically Signed   By: Genevie Ann M.D.   On: 12/01/2019 11:45   MR Thoracic Spine W Wo Contrast  Result Date: 12/01/2019 CLINICAL DATA:  Upper back pain for approximately 3 weeks. The patient has suffered several falls. EXAM: MRI THORACIC WITHOUT AND WITH CONTRAST TECHNIQUE: Multiplanar and multiecho pulse sequences of the thoracic spine were obtained without and with intravenous contrast. CONTRAST:  6 mL GADAVIST IV SOLN COMPARISON:  CT chest, abdomen and pelvis 07/27/2019 and 10/23/2019. FINDINGS: MRI THORACIC SPINE FINDINGS Alignment:  Maintained.  Exaggeration of thoracic kyphosis is noted. Vertebrae: The patient has a severe compression fracture of T7 with vertebra plana deformity identified. There is marrow edema and enhancement in the vertebral body. Mild marrow edema in the T7 pedicles is greater on the right. There was a mild superior endplate compression fracture at T7 on the 10/23/2019 CT and T7 was intact on the 07/27/2019 CT. Minimal marrow edema is seen in the anterior aspect of the inferior endplate of T6. Cord:  Normal  signal throughout. Paraspinal and other soft tissues: Negative. Disc levels: T1-2  through T5-6 are negative. T6-7: Mild bony retropulsion is seen off the posterior margin of T7 but there is no stenosis. No epidural hematoma is identified. T8-9: Shallow left paracentral protrusion indents the ventral thecal sac but the central canal and foramina are open. T9-10: Shallow right paracentral protrusion without stenosis. T10-11: Minimal disc bulge without stenosis. T11-12: Minimal disc bulge without stenosis. T12-L1: Negative. 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 There is mild bony retropulsion off the posterior margin of T7 but no stenosis. Small focus of marrow edema in the anterior, inferior endplate of T6 may be due to contusion without fracture. 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 Electronically Signed   By: Inge Rise M.D.   On: 12/01/2019 11:29   CT Abdomen Pelvis W Contrast  Result Date: 01/19/2020 CLINICAL DATA:  [place] down concrete steps 2 days ago. Blunt chest and abdominal trauma, with pain and tenderness. Initial encounter. EXAM: CT CHEST, ABDOMEN, AND PELVIS WITH CONTRAST TECHNIQUE: Multidetector CT imaging of the chest, abdomen and pelvis was performed following the standard protocol during bolus administration of intravenous contrast. CONTRAST:  167mL OMNIPAQUE IOHEXOL 300 MG/ML  SOLN COMPARISON:  10/23/2019 FINDINGS: CT CHEST FINDINGS Cardiovascular: No evidence of thoracic aortic injury or mediastinal hematoma. No pericardial effusion. Aortic and coronary artery atherosclerosis incidentally noted. Mediastinum/Nodes: No evidence of pneumomediastinum. No masses or pathologically enlarged lymph nodes identified. Lungs/Pleura: No evidence of pulmonary contusion or other infiltrate. Bibasilar atelectasis or scarring  noted. No evidence of pneumothorax or hemothorax. Musculoskeletal: A severe compression fracture of the T7 vertebral body is seen. This was not seen on previous study approximately 3 months ago, but shows sclerosis indicating that it is subacute in age. CT ABDOMEN PELVIS FINDINGS Hepatobiliary: No hepatic laceration or mass identified. Prior cholecystectomy. No evidence of biliary obstruction. Pancreas: No parenchymal laceration, mass, or inflammatory changes identified. Spleen: No evidence of splenic laceration. Adrenal/Urinary Tract: No hemorrhage or parenchymal lacerations identified. No evidence of mass or hydronephrosis. Stomach/Bowel: No evidence of bowel wall thickening or dilatation. No evidence of hemoperitoneum. Diverticulosis is seen mainly involving the sigmoid colon, however there is no evidence of diverticulitis. A moderate ventral abdominal wall hernia is again seen containing the multiple small bowel loops. No evidence of small bowel obstruction or strangulation. Vascular/Lymphatic: No evidence of abdominal aortic injury or retroperitoneal hemorrhage. Aortic atherosclerosis incidentally noted. No pathologically enlarged lymph nodes identified. Reproductive:  No mass or other significant abnormality identified. Other:  None. Musculoskeletal: No acute fractures or suspicious bone lesions identified. IMPRESSION: 1. Severe compression fracture of T7 vertebral body, likely subacute in age. 2. No evidence of thoracic aortic injury or mediastinal hematoma. 3. No evidence of abdominal organ injury or hemoperitoneum. 4. Moderate ventral abdominal wall hernia containing multiple small bowel loops. 5. Colonic diverticulosis, without radiographic evidence of diverticulitis. 6. Aortic and coronary artery atherosclerosis. Electronically Signed   By: Marlaine Hind M.D.   On: 01/19/2020 18:31     ASSESSMENT & PLAN:  1. Small cell lung cancer ([place])   2. Encounter for antineoplastic immunotherapy   3. Thoracic  compression fracture, closed, initial encounter ([place])   4. Frequent falls   5. Osteopenia, unspecified location    #Extensive small cell lung cancer S/p 4  cycles of Carboplatin, Etoposide and Tecentriq.   Status post chest and whole brain radiation.  Patient clinically doing well from oncology aspect. Labs reviewed and discussed with patient. Her counts acceptable to proceed with Tecentriq maintenance treatment today. CT chest abdomen pelvis was done recently for work-up after she had her fall.  Images were independently reviewed and discussed with patient.-She remains in remission.  #T7 vertebral compression fracture without evidence of underlying pathological osseous lesions. Continue fentanyl patch with Norco as needed for pain.  Patient was referred to spine specialist by ER.  #Osteopenia continue calcium and vitamin D supplementation. Bone metastasis, proceed with Zometa every 4 to 6 weeks.  She received Zometa treatment today. #Frequent falls, physical therapy. Depression has been well controlled.  Continue Zoloft... discussed with Altha Harm.  Patient will continue follow-up with palliative care service. Follow up in 3 weeks for eval with next cycle of tecentriq    Earlie Server, MD, PhD

## 2020-01-25 ENCOUNTER — Telehealth: Payer: Self-pay

## 2020-01-26 NOTE — Telephone Encounter (Signed)
SW attempted to contact patient to schedule visit. SW left VM with contact information.

## 2020-01-31 ENCOUNTER — Telehealth: Payer: Self-pay

## 2020-01-31 NOTE — Telephone Encounter (Signed)
Telephone call to patients husbands phone.  Husband did not answer, RN left message requesting call back to schedule palliative care visit.

## 2020-02-01 ENCOUNTER — Telehealth: Payer: Self-pay

## 2020-02-01 NOTE — Telephone Encounter (Signed)
Telephone call to patient to schedule palliative care visit.  Patient is agreement with palliative care team making home visit 02/05/20 at 1:00 PM.

## 2020-02-05 ENCOUNTER — Other Ambulatory Visit: Payer: Medicare Other

## 2020-02-05 ENCOUNTER — Other Ambulatory Visit: Payer: Self-pay

## 2020-02-05 DIAGNOSIS — Z515 Encounter for palliative care: Secondary | ICD-10-CM

## 2020-02-05 NOTE — Progress Notes (Signed)
COMMUNITY PALLIATIVE CARE SW NOTE  PATIENT NAME: Kristi [name] DOB: Jul 10, 1946 MRN: [id number]  PRIMARY CARE PROVIDER: Glean Hess, MD  RESPONSIBLE PARTY:  Acct ID - Guarantor Home Phone Work Phone Relationship Acct Type  [id number] - Kristi [name],[name] [phone]  Self P/F     [place], Kristi [name], Kristi [name] [id number]     PLAN OF CARE and INTERVENTIONS:             1. GOALS OF CARE/ ADVANCE CARE PLANNING:  Patient's Epic chart indicates that she is a DNR from a MOST form completed in 11/2018. In discussion, patient said she is "unsure". Patient and team discussed advance care planning, discussion to continue. Goal is to remain at home with husband and to maintain quality of life.  2. SOCIAL/EMOTIONAL/SPIRITUAL ASSESSMENT/ INTERVENTIONS:  SW and RN met with patient and Kristi [name] (patient's husband) in the home. Patient and Kristi [name] provided brief history. Patient reports she was diagnosed with small cell lung cancer in October 2019. Patient reports pain in her back and upper abdomen. Patient rates pain at 5 out of 10. Patient does have a pain patch on now and said this is helping. Patient noted her appetite is fair, has lost 35 pounds in total. Patient is resting well when she can get comfortable. Patient does get SOB with exertion. Patient and husband moved to [place] about 5 years ago. Patient is from [place], [place]. Patient and Kristi [name] have been married for 46 years. Patient and Kristi [name] currently live in a second floor apartment but are moving to the first floor next month. Patient is retired, worked various jobs in [place]. Patient has two sons and one daughter. Patient has one son in [place] and one granddaughter in [place]. A majority of patient's family is in [place]. Patient said she also has two great grandchildren and another one is due soon. SW provided education on palliative care, discussed goals of care, used active and reflective listening and provided emotional  support. 3. PATIENT/CAREGIVER EDUCATION/ COPING:  Patient is alert, oriented but forgetful. Patient copes with her feelings openly. Patient said her pain causes her to be more agitated at times. Family is supportive. 4. PERSONAL EMERGENCY PLAN:  Family will call 9-1-1 for emergencies.  5. COMMUNITY RESOURCES COORDINATION/ HEALTH CARE NAVIGATION:  Kristi [name] helps navigate care. Patient has an appointment to see a new neurologist tomorrow. Patient is scheduled for labs, visit with MD and infusion on 2/22. 6. FINANCIAL/LEGAL CONCERNS/INTERVENTIONS:  None.     SOCIAL HX:  Social History   Tobacco Use  . Smoking status: Former Smoker    Packs/day: 1.00    Years: 39.00    Pack years: 39.00    Types: Cigarettes    Start date: 12/21/1978    Quit date: 10/16/2018    Years since quitting: 1.3  . Smokeless tobacco: Never Used  Substance Use Topics  . Alcohol use: Yes    Alcohol/week: 0.0 standard drinks    Comment: occassional - approx 1 every 2 weeks     CODE STATUS:   Code Status: Prior (DNR)  ADVANCED DIRECTIVES: N MOST FORM COMPLETE: Yes. HOSPICE EDUCATION PROVIDED: None.  PPS: Patient is independent of ADLs. Patient uses her wheelchair when she is out of the home.   I spent 60 minutes with patient/family, from 1:00-2:00p providing education, support and consultation.    Kristi Lombard, LCSW

## 2020-02-05 NOTE — Progress Notes (Signed)
PATIENT NAME: Kristi [name] [facility] DOB: 1946/04/05 MRN: [id number]  PRIMARY CARE PROVIDER: Glean Hess, MD  RESPONSIBLE PARTY:  Acct ID - Guarantor Home Phone Work Phone Relationship Acct Type  [id number] - Reever,[name] [phone]  Self P/F     [place], [place], [place] [address]    PLAN OF CARE and INTERVENTIONS:               1.  GOALS OF CARE/ ADVANCE CARE PLANNING:  Remain in home with husband and continue with immunotherapy.               2.  PATIENT/CAREGIVER EDUCATION:  Education on fall precautions, education on pain management, education on s/s of infection, reviewed meds, support               3.  DISEASE STATUS:  SW and RN made scheduled palliative care home visit. Palliative care team met with patient and her husband Marden Noble. Patient has small cell lung cancer with bone metastases. Patient has completed chemo and radiation treatments. Patient currently on immunotherapy and Zometa for bone metastases. Patiently currently rates pain at 5 on pain scale and reports pain is in her back and chest area. Patient reports when she coughs or sneezes pain is much worse. Patient currently on 25 microgram fentanyl patch and has Norco to take for breakthrough pain. Patient reports she does not like to take Norco as she does not want to get addicted to pain medication. Nurse provided education to patient that she may develop a tolerance rather than addiction and patient should take medications rather than suffer with pain. Patient and husband in agreement and husband reports he understands patient should take pain medication before pain worsens.  Patient reports she has lost 35 pounds since being diagnosed with cancer in October of 2019. Patients current weight is 143 lbs. Patient has port-a-cath. Patient scheduled to see neurosurgeon due to disc problems in her back. Patient is independent with ADLs. Patient reports since having brain radiation she has been more forgetful. Patient reports her appetite  is fine and patient snacking on chips when palliative care team arrives. Patients vital signs are stable. Patient admitted she tires easily when she goes out or walks any distance. Patient reports she has a wheelchair but only uses if she goes out. Patient has no edema in her lower extremities. Patient reports she has been sleeping well and currently sleeps in a normal bed. Nurse reviewed medications with patients husband. Patient and wife currently live in upstairs apartment but will be moving to downstairs apartment the beginning of March. Patient and husband in agreement with palliative care services. Patient and husband encouraged to contact palliative care with questions or concerns. Palliative care folder left in the home with husband.    HISTORY OF PRESENT ILLNESS: Patient is a 74 year old female who resides in apartment with her husband.  Patient open to palliative care services.  Patient will be seen monthly and PRN.     CODE STATUS: CPR no vent  ADVANCED DIRECTIVES: No MOST FORM:Yes but patient was unable to locate in her home PPS: 50%   PHYSICAL EXAM:   VITALS: Today's Vitals   02/05/20 1605  BP: 108/60  Pulse: 80  Resp: 18  Temp: (!) 97.3 F (36.3 C)  TempSrc: Temporal  SpO2: 98%  Weight: 143 lb (64.9 kg)  Height: '5\' 8"'$  (1.727 m)  PainSc: 5   PainLoc: Chest    LUNGS: decreased breath sounds  CARDIAC: Cor RRR  EXTREMITIES: none edema SKIN: bruising to left upper forehead and left eye from fall patient suffered the end of January   NEURO: positive for memory problems and weakness        Nilda Simmer, RN

## 2020-02-06 DIAGNOSIS — M5124 Other intervertebral disc displacement, thoracic region: Secondary | ICD-10-CM | POA: Diagnosis not present

## 2020-02-06 DIAGNOSIS — S22060A Wedge compression fracture of T7-T8 vertebra, initial encounter for closed fracture: Secondary | ICD-10-CM | POA: Diagnosis not present

## 2020-02-06 DIAGNOSIS — W1839XA Other fall on same level, initial encounter: Secondary | ICD-10-CM | POA: Diagnosis not present

## 2020-02-06 DIAGNOSIS — M546 Pain in thoracic spine: Secondary | ICD-10-CM | POA: Diagnosis not present

## 2020-02-06 DIAGNOSIS — M5134 Other intervertebral disc degeneration, thoracic region: Secondary | ICD-10-CM | POA: Diagnosis not present

## 2020-02-06 DIAGNOSIS — Z87891 Personal history of nicotine dependence: Secondary | ICD-10-CM | POA: Diagnosis not present

## 2020-02-06 DIAGNOSIS — R2689 Other abnormalities of gait and mobility: Secondary | ICD-10-CM | POA: Diagnosis not present

## 2020-02-12 ENCOUNTER — Inpatient Hospital Stay (HOSPITAL_BASED_OUTPATIENT_CLINIC_OR_DEPARTMENT_OTHER): Payer: Medicare Other | Admitting: Hospice and Palliative Medicine

## 2020-02-12 ENCOUNTER — Inpatient Hospital Stay (HOSPITAL_BASED_OUTPATIENT_CLINIC_OR_DEPARTMENT_OTHER): Payer: Medicare Other | Admitting: Oncology

## 2020-02-12 ENCOUNTER — Inpatient Hospital Stay: Payer: Medicare Other | Admitting: *Deleted

## 2020-02-12 ENCOUNTER — Encounter: Payer: Self-pay | Admitting: Oncology

## 2020-02-12 ENCOUNTER — Inpatient Hospital Stay: Payer: Medicare Other

## 2020-02-12 ENCOUNTER — Other Ambulatory Visit: Payer: Self-pay

## 2020-02-12 VITALS — BP 113/77 | HR 89 | Temp 98.4°F | Resp 16 | Wt 144.5 lb

## 2020-02-12 DIAGNOSIS — R413 Other amnesia: Secondary | ICD-10-CM

## 2020-02-12 DIAGNOSIS — Z5112 Encounter for antineoplastic immunotherapy: Secondary | ICD-10-CM | POA: Diagnosis not present

## 2020-02-12 DIAGNOSIS — C3490 Malignant neoplasm of unspecified part of unspecified bronchus or lung: Secondary | ICD-10-CM | POA: Diagnosis not present

## 2020-02-12 DIAGNOSIS — S22000A Wedge compression fracture of unspecified thoracic vertebra, initial encounter for closed fracture: Secondary | ICD-10-CM | POA: Diagnosis not present

## 2020-02-12 DIAGNOSIS — Z79899 Other long term (current) drug therapy: Secondary | ICD-10-CM | POA: Diagnosis not present

## 2020-02-12 DIAGNOSIS — C7951 Secondary malignant neoplasm of bone: Secondary | ICD-10-CM | POA: Diagnosis not present

## 2020-02-12 DIAGNOSIS — M8580 Other specified disorders of bone density and structure, unspecified site: Secondary | ICD-10-CM

## 2020-02-12 DIAGNOSIS — Z515 Encounter for palliative care: Secondary | ICD-10-CM

## 2020-02-12 DIAGNOSIS — R296 Repeated falls: Secondary | ICD-10-CM

## 2020-02-12 DIAGNOSIS — Z95828 Presence of other vascular implants and grafts: Secondary | ICD-10-CM

## 2020-02-12 DIAGNOSIS — Z87891 Personal history of nicotine dependence: Secondary | ICD-10-CM | POA: Diagnosis not present

## 2020-02-12 LAB — CBC WITH DIFFERENTIAL/PLATELET
Abs Immature Granulocytes: 0.01 10*3/uL (ref 0.00–0.07)
Basophils Absolute: 0 10*3/uL (ref 0.0–0.1)
Basophils Relative: 1 %
Eosinophils Absolute: 0.3 10*3/uL (ref 0.0–0.5)
Eosinophils Relative: 5 %
HCT: 36.3 % (ref 36.0–46.0)
Hemoglobin: 12.1 g/dL (ref 12.0–15.0)
Immature Granulocytes: 0 %
Lymphocytes Relative: 20 %
Lymphs Abs: 1.1 10*3/uL (ref 0.7–4.0)
MCH: 29.5 pg (ref 26.0–34.0)
MCHC: 33.3 g/dL (ref 30.0–36.0)
MCV: 88.5 fL (ref 80.0–100.0)
Monocytes Absolute: 0.8 10*3/uL (ref 0.1–1.0)
Monocytes Relative: 15 %
Neutro Abs: 3.2 10*3/uL (ref 1.7–7.7)
Neutrophils Relative %: 59 %
Platelets: 159 10*3/uL (ref 150–400)
RBC: 4.1 MIL/uL (ref 3.87–5.11)
RDW: 13.3 % (ref 11.5–15.5)
WBC: 5.4 10*3/uL (ref 4.0–10.5)
nRBC: 0 % (ref 0.0–0.2)

## 2020-02-12 LAB — COMPREHENSIVE METABOLIC PANEL
ALT: 10 U/L (ref 0–44)
AST: 13 U/L — ABNORMAL LOW (ref 15–41)
Albumin: 4 g/dL (ref 3.5–5.0)
Alkaline Phosphatase: 55 U/L (ref 38–126)
Anion gap: 9 (ref 5–15)
BUN: 16 mg/dL (ref 8–23)
CO2: 24 mmol/L (ref 22–32)
Calcium: 9.1 mg/dL (ref 8.9–10.3)
Chloride: 105 mmol/L (ref 98–111)
Creatinine, Ser: 0.76 mg/dL (ref 0.44–1.00)
GFR calc Af Amer: >60 mL/min (ref >60)
GFR calc non Af Amer: >60 mL/min (ref >60)
Glucose, Bld: 104 mg/dL — ABNORMAL HIGH (ref 70–99)
Potassium: 3.7 mmol/L (ref 3.5–5.1)
Sodium: 138 mmol/L (ref 135–145)
Total Bilirubin: 0.5 mg/dL (ref 0.3–1.2)
Total Protein: 7.1 g/dL (ref 6.5–8.1)

## 2020-02-12 MED ORDER — HYDROCODONE-ACETAMINOPHEN 5-325 MG PO TABS: 1.0000 | ORAL_TABLET | Freq: Four times a day (QID) | ORAL | 0 refills | Status: DC | PRN

## 2020-02-12 MED ORDER — SODIUM CHLORIDE 0.9% FLUSH
10.0000 mL | Freq: Once | INTRAVENOUS | Status: AC
  Administered 2020-02-12: 10 mL via INTRAVENOUS
  Filled 2020-02-12: qty 10

## 2020-02-12 MED ORDER — DEXAMETHASONE SODIUM PHOSPHATE 10 MG/ML IJ SOLN
10.0000 mg | Freq: Once | INTRAMUSCULAR | Status: AC
  Administered 2020-02-12: 10 mg via INTRAVENOUS
  Filled 2020-02-12: qty 1

## 2020-02-12 MED ORDER — SODIUM CHLORIDE 0.9 % IV SOLN
1200.0000 mg | Freq: Once | INTRAVENOUS | Status: AC
  Administered 2020-02-12: 1200 mg via INTRAVENOUS
  Filled 2020-02-12: qty 20

## 2020-02-12 MED ORDER — HEPARIN SOD (PORK) LOCK FLUSH 100 UNIT/ML IV SOLN
500.0000 [IU] | Freq: Once | INTRAVENOUS | Status: AC | PRN
  Administered 2020-02-12: 500 [IU]
  Filled 2020-02-12: qty 5

## 2020-02-12 MED ORDER — FENTANYL 25 MCG/HR TD PT72: 1.0000 | MEDICATED_PATCH | TRANSDERMAL | 0 refills | Status: DC

## 2020-02-12 MED ORDER — SODIUM CHLORIDE 0.9 % IV SOLN
Freq: Once | INTRAVENOUS | Status: AC
  Filled 2020-02-12: qty 250

## 2020-02-12 NOTE — Progress Notes (Signed)
[place]  Telephone:([phone] [phone]   Name: Kristi [name] [facility] Date: 02/12/2020 MRN: [id number]  DOB: [birth year]  Patient Care Team: Glean Hess, MD as PCP - General (Internal Medicine) Charolette Forward, MD as Consulting Physician (Cardiology) Telford Nab, RN as Registered Nurse    REASON FOR CONSULTATION: Palliative Care consult requested for this 74 y.o. female with multiple medical problems including stage IV small cell lung cancer.  PMH also notable for CAD with history of acute MI, claustrophobia, hypertension, and hyperlipidemia.  Patient was recently seen and examined by pulmonology due to worsening exertional dyspnea.  She was found to have a central obstructing mass and mediastinal adenopathy on CT.  Patient is status post bronchoscopy with biopsy confirming small cell lung cancer.  PET CT scan revealed hypermetabolic areas involving lymphadenopathy in the neck and supraclavicular region, hepatoduodenal ligament, and disease of the right acetabulum.  Patient is pending s/p 4 cycles chemotherapy with Atezolizumab, Carboplatin, and Etoposide.    She is also status post RT to the chest and whole brain.  Patient is now on maintenance  immunotherapy.  Palliative care was asked to help address goals and symptoms.   SOCIAL HISTORY:    Patient is married and lives at home with her husband of 82 years.  They moved here from [place], [place] around 4 years ago.  Patient has 2 sons and a daughter.  One son lives in [place] and the other 2 children are in [place].  Patient used to work for a [employer] and then later for the local [employer].  ADVANCE DIRECTIVES:  Not on file  CODE STATUS: DNR (MOST form completed on 11/24/18)  PAST MEDICAL HISTORY: Past Medical History:  Diagnosis Date  . Acute MI, inferoposterior wall ([place]) 09/30/2014  . Claustrophobia   . Coronary artery disease   .  Hypercholesteremia   . Hypertension   . MI, old   . 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 [facility]) 10/27/2018    PAST SURGICAL HISTORY:  Past Surgical History:  Procedure Laterality Date  . APPENDECTOMY     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  . BLADDER NECK SUSPENSION    . CHOLECYSTECTOMY N/A 08/14/2018   Procedure: LAPAROSCOPIC CHOLECYSTECTOMY;  Surgeon: Jules Husbands, MD;  Location: [facility] ORS;  Service: General;  Laterality: N/A;  . CHOLECYSTECTOMY  11/2018  . CORONARY STENT PLACEMENT    . ENDOBRONCHIAL ULTRASOUND N/A 10/21/2018   Procedure: ENDOBRONCHIAL ULTRASOUND;  Surgeon: Laverle Hobby, MD;  Location: [facility] ORS;  Service: Pulmonary;  Laterality: N/A;  . HERNIA REPAIR    . IR FLUORO GUIDE CV LINE RIGHT  12/19/2018  . LEFT HEART CATHETERIZATION WITH CORONARY ANGIOGRAM N/A 09/29/2014   Procedure: LEFT HEART CATHETERIZATION WITH CORONARY ANGIOGRAM;  Surgeon: Clent Demark, MD;  Location: [facility] CATH LAB;  Service: Cardiovascular;  Laterality: N/A;  . PORTA CATH INSERTION N/A 01/02/2019   Procedure: PORTA CATH INSERTION;  Surgeon: Algernon Huxley, MD;  Location: [facility] CV LAB;  Service: Cardiovascular;  Laterality: N/A;  . PORTACATH PLACEMENT Right 10/28/2018   Procedure: INSERTION PORT-A-CATH;  Surgeon: Jules Husbands, MD;  Location: [facility] ORS;  Service: General;  Laterality: Right;    HEMATOLOGY/ONCOLOGY HISTORY:  Oncology History Overview Note  ELOYSE CAUSEY is a  74 y.o.  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   10/20/2018 CT chest with contrast showed large  mediastinal mass involving both hilar, left greater than right, consistent with lung carcinoma, The mass causes narrowing of the left mainstem bronchus with resultant volume loss on the left and a mediastinal shift to the left.  Moderate size left pleural effusion. Patient underwent E bus bronchoscopy 10/21/2018 Left mainstem bronchus transbronchial forcep biopsy showed small cell  carcinoma.   # Nov 2019- Jan 2020 s/p 4 cycles of Carbo/Etoposide/Tecentriq #  01/24/2019 interim CT scan done which showed continued positive response to therapy with continued reduction in mediastinal adenopathy.  No residual measurable left lung mass. CT findings of acute emphysematous cystitis. Urology did not feel that patient has pyelonephritis and recommend treatment with antibiotics because patient's immunocompromise. Patient finished treatment.    # Chest and whole brain radiation finished in May 2020 # 04/25/2019 resume Tecentriq every 3 weeks.   Small cell lung cancer ([place])  10/27/2018 Initial Diagnosis   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 [facility])   11/02/2018 -  Chemotherapy   The patient had palonosetron (ALOXI) injection 0.25 mg, 0.25 mg, Intravenous,  Once, 4 of 4 cycles Administration: 0.25 mg (11/02/2018), 0.25 mg (11/23/2018), 0.25 mg (12/19/2018), 0.25 mg (01/09/2019) pegfilgrastim-cbqv (UDENYCA) injection 6 mg, 6 mg, Subcutaneous, Once, 4 of 4 cycles Administration: 6 mg (11/07/2018), 6 mg (11/28/2018), 6 mg (12/23/2018), 6 mg (01/12/2019) CARBOplatin (PARAPLATIN) 390 mg in sodium chloride 0.9 % 250 mL chemo infusion, 390 mg (100 % of original dose 394.5 mg), Intravenous,  Once, 4 of 4 cycles Dose modification:   (original dose 394.5 mg, Cycle 1) Administration: 390 mg (11/02/2018), 390 mg (11/23/2018), 390 mg (12/19/2018), 390 mg (01/09/2019) etoposide (VEPESID) 180 mg in sodium chloride 0.9 % 500 mL chemo infusion, 100 mg/m2 = 180 mg, Intravenous,  Once, 4 of 4 cycles Administration: 180 mg (11/02/2018), 180 mg (11/03/2018), 180 mg (11/04/2018), 180 mg (11/23/2018), 180 mg (11/24/2018), 180 mg (11/25/2018), 180 mg (12/19/2018), 180 mg (12/20/2018), 180 mg (12/22/2018), 180 mg (01/09/2019), 180 mg (01/10/2019), 180 mg (01/11/2019) fosaprepitant (EMEND) 150 mg, dexamethasone (DECADRON) 12 mg in sodium chloride 0.9 % 145 mL IVPB, , Intravenous,  Once, 4 of 4 cycles Administration:  (11/02/2018),   (11/23/2018),  (12/19/2018),  (01/09/2019) atezolizumab (TECENTRIQ) 1,200 mg in sodium chloride 0.9 % 250 mL chemo infusion, 1,200 mg, Intravenous, Once, 17 of 28 cycles Administration: 1,200 mg (11/23/2018), 1,200 mg (12/19/2018), 1,200 mg (01/09/2019), 1,200 mg (04/24/2019), 1,200 mg (05/18/2019), 1,200 mg (06/15/2019), 1,200 mg (07/13/2019), 1,200 mg (08/03/2019), 1,200 mg (08/24/2019), 1,200 mg (09/14/2019), 1,200 mg (10/05/2019), 1,200 mg (10/26/2019), 1,200 mg (01/01/2020), 1,200 mg (12/11/2019), 1,200 mg (11/20/2019), 1,200 mg (01/22/2020), 1,200 mg (02/12/2020)  for chemotherapy treatment.      ALLERGIES:  has No Known Allergies.  MEDICATIONS:  Current Outpatient Medications  Medication Sig Dispense Refill  . atorvastatin (LIPITOR) 80 MG tablet Take 0.5 tablets (40 mg total) by mouth daily at 6 PM. 30 tablet 3  . CALCIUM PO Take 1 tablet by mouth daily.     . Cholecalciferol (VITAMIN D) 50 MCG (2000 UT) CAPS Take 1 capsule by mouth daily.    . diphenhydrAMINE-zinc acetate (BENADRYL EXTRA STRENGTH) cream Apply 1 application topically 3 (three) times daily as needed for itching. 28.4 g 0  . fentaNYL (DURAGESIC) 25 MCG/HR Place 1 patch onto the skin every 3 (three) days. 10 patch 0  . HYDROcodone-acetaminophen (NORCO/VICODIN) 5-325 MG tablet Take 1 tablet by mouth every 6 (six) hours as needed for moderate pain. 90 tablet 0  . lidocaine-prilocaine (EMLA) cream Apply to  affected area once 30 g 3  . metoprolol tartrate (LOPRESSOR) 25 MG tablet Take 12.5 mg by mouth 2 (two) times daily.    . nitroGLYCERIN (NITROSTAT) 0.4 MG SL tablet Place 1 tablet (0.4 mg total) under the tongue every 5 (five) minutes x 3 doses as needed for chest pain. 25 tablet 12  . omeprazole (PRILOSEC) 40 MG capsule TAKE ONE CAPSULE BY MOUTH DAILY 90 capsule 1  . ondansetron (ZOFRAN) 8 MG tablet Take 1 tablet (8 mg total) by mouth 2 (two) times daily as needed for refractory nausea / vomiting. Start on day 3 after carboplatin chemo. 30  tablet 1  . polyethylene glycol (MIRALAX / GLYCOLAX) packet Take 17 g by mouth daily as needed.     . prochlorperazine (COMPAZINE) 10 MG tablet TAKE 1 TABLET BY MOUTH EVERY 6 HOURS AS NEEDED FOR NAUSEA OR VOMITING 270 tablet 1  . sertraline (ZOLOFT) 50 MG tablet Take 1.5 tablets (75 mg total) by mouth daily. 45 tablet 1  . triamterene-hydrochlorothiazide (DYAZIDE) 37.5-25 MG capsule Take 1 each (1 capsule total) by mouth daily as needed. 90 capsule 0  . vitamin B-12 (CYANOCOBALAMIN) 1000 MCG tablet Take 1,000 mcg by mouth daily.     No current facility-administered medications for this visit.   Facility-Administered Medications Ordered in Other Visits  Medication Dose Route Frequency Provider Last Rate Last Admin  . heparin lock flush 100 unit/mL  500 Units Intravenous Once Earlie Server, MD      . heparin lock flush 100 unit/mL  500 Units Intravenous Once Earlie Server, MD      . sodium chloride flush (NS) 0.9 % injection 10 mL  10 mL Intravenous PRN Earlie Server, MD   10 mL at 01/09/19 0820  . sodium chloride flush (NS) 0.9 % injection 10 mL  10 mL Intravenous PRN Earlie Server, MD   10 mL at 01/10/19 1400    VITAL SIGNS: There were no vitals taken for this visit. There were no vitals filed for this visit.  Estimated body mass index is 21.97 kg/m as calculated from the following:   Height as of 02/05/20: 5' 8" (1.727 m).   Weight as of an earlier encounter on 02/12/20: 144 lb 8 oz (65.5 kg).  LABS: CBC:    Component Value Date/Time   WBC 5.4 02/12/2020 0921   HGB 12.1 02/12/2020 0921   HGB 14.5 09/19/2018 1038   HCT 36.3 02/12/2020 0921   HCT 42.7 09/19/2018 1038   PLT 159 02/12/2020 0921   PLT 265 09/19/2018 1038   MCV 88.5 02/12/2020 0921   MCV 91 09/19/2018 1038   NEUTROABS 3.2 02/12/2020 0921   NEUTROABS 5.1 09/19/2018 1038   LYMPHSABS 1.1 02/12/2020 0921   LYMPHSABS 2.0 09/19/2018 1038   MONOABS 0.8 02/12/2020 0921   EOSABS 0.3 02/12/2020 0921   EOSABS 0.3 09/19/2018 1038   BASOSABS  0.0 02/12/2020 0921   BASOSABS 0.1 09/19/2018 1038   Comprehensive Metabolic Panel:    Component Value Date/Time   NA 138 02/12/2020 0921   NA 136 09/19/2018 1038   K 3.7 02/12/2020 0921   CL 105 02/12/2020 0921   CO2 24 02/12/2020 0921   BUN 16 02/12/2020 0921   BUN 6 (L) 09/19/2018 1038   CREATININE 0.76 02/12/2020 0921   GLUCOSE 104 (H) 02/12/2020 0921   CALCIUM 9.1 02/12/2020 0921   AST 13 (L) 02/12/2020 0921   ALT 10 02/12/2020 0921   ALKPHOS 55 02/12/2020 0921   BILITOT 0.5  02/12/2020 0921   BILITOT 0.5 09/19/2018 1038   PROT 7.1 02/12/2020 0921   PROT 7.4 09/19/2018 1038   ALBUMIN 4.0 02/12/2020 0921   ALBUMIN 4.3 09/19/2018 1038    RADIOGRAPHIC STUDIES: DG Wrist Complete Left  Result Date: 01/19/2020 CLINICAL DATA:  [place] [address] down 5 stairs 2 days ago with left wrist pain. EXAM: LEFT WRIST - COMPLETE 3+ VIEW COMPARISON:  None. FINDINGS: Mild degenerate change over the radiocarpal joint and first carpometacarpal joints. Mild osteopenia is present. No evidence of acute fracture or dislocation. IMPRESSION: No acute fracture. Electronically Signed   By: Marin Olp M.D.   On: 01/19/2020 15:04   CT Head Wo Contrast  Result Date: 01/19/2020 CLINICAL DATA:  [address] down stairs bruising to the left forehead puncture wound EXAM: CT HEAD WITHOUT CONTRAST CT CERVICAL SPINE WITHOUT CONTRAST TECHNIQUE: Multidetector CT imaging of the head and cervical spine was performed following the standard protocol without intravenous contrast. Multiplanar CT image reconstructions of the cervical spine were also generated. COMPARISON:  MRI 12/01/2019, CT brain 07/27/2016 FINDINGS: CT HEAD FINDINGS Brain: No acute territorial infarction, hemorrhage or intracranial mass is visualized. Extensive white matter hypodensity throughout the brain, felt consistent with history of whole brain radiation. Mild atrophy. Stable ventricle size Vascular: No hyperdense vessels.  Carotid vascular calcification Skull: Normal.  Negative for fracture or focal lesion. Sinuses/Orbits: No acute finding. Other: Small forehead scalp hematoma CT CERVICAL SPINE FINDINGS Alignment: No subluxation.  Facet alignment is normal. Skull base and vertebrae: No acute fracture. No primary bone lesion or focal pathologic process. Soft tissues and spinal canal: No prevertebral fluid or swelling. No visible canal hematoma. Disc levels: Advanced degenerative changes C4-C5, C5-C6 and C6-C7. Multiple level facet degenerative change. Upper chest: Emphysema Other: None IMPRESSION: 1. No CT evidence for acute intracranial abnormality. 2. Extensive white matter disease, felt consistent with history of whole brain radiation. 3. Degenerative changes of the cervical spine. No acute osseous abnormality 4. Emphysema Electronically Signed   By: Donavan Foil M.D.   On: 01/19/2020 18:25   CT Chest W Contrast  Result Date: 01/19/2020 CLINICAL DATA:  [place] [address] down concrete steps 2 days ago. Blunt chest and abdominal trauma, with pain and tenderness. Initial encounter. EXAM: CT CHEST, ABDOMEN, AND PELVIS WITH CONTRAST TECHNIQUE: Multidetector CT imaging of the chest, abdomen and pelvis was performed following the standard protocol during bolus administration of intravenous contrast. CONTRAST:  132m OMNIPAQUE IOHEXOL 300 MG/ML  SOLN COMPARISON:  10/23/2019 FINDINGS: CT CHEST FINDINGS Cardiovascular: No evidence of thoracic aortic injury or mediastinal hematoma. No pericardial effusion. Aortic and coronary artery atherosclerosis incidentally noted. Mediastinum/Nodes: No evidence of pneumomediastinum. No masses or pathologically enlarged lymph nodes identified. Lungs/Pleura: No evidence of pulmonary contusion or other infiltrate. Bibasilar atelectasis or scarring noted. No evidence of pneumothorax or hemothorax. Musculoskeletal: A severe compression fracture of the T7 vertebral body is seen. This was not seen on previous study approximately 3 months ago, but shows sclerosis  indicating that it is subacute in age. CT ABDOMEN PELVIS FINDINGS Hepatobiliary: No hepatic laceration or mass identified. Prior cholecystectomy. No evidence of biliary obstruction. Pancreas: No parenchymal laceration, mass, or inflammatory changes identified. Spleen: No evidence of splenic laceration. Adrenal/Urinary Tract: No hemorrhage or parenchymal lacerations identified. No evidence of mass or hydronephrosis. Stomach/Bowel: No evidence of bowel wall thickening or dilatation. No evidence of hemoperitoneum. Diverticulosis is seen mainly involving the sigmoid colon, however there is no evidence of diverticulitis. A moderate ventral abdominal wall hernia is again seen containing the  multiple small bowel loops. No evidence of small bowel obstruction or strangulation. Vascular/Lymphatic: No evidence of abdominal aortic injury or retroperitoneal hemorrhage. Aortic atherosclerosis incidentally noted. No pathologically enlarged lymph nodes identified. Reproductive:  No mass or other significant abnormality identified. Other:  None. Musculoskeletal: No acute fractures or suspicious bone lesions identified. IMPRESSION: 1. Severe compression fracture of T7 vertebral body, likely subacute in age. 2. No evidence of thoracic aortic injury or mediastinal hematoma. 3. No evidence of abdominal organ injury or hemoperitoneum. 4. Moderate ventral abdominal wall hernia containing multiple small bowel loops. 5. Colonic diverticulosis, without radiographic evidence of diverticulitis. 6. Aortic and coronary artery atherosclerosis. Electronically Signed   By: Marlaine Hind M.D.   On: 01/19/2020 18:31   CT Cervical Spine Wo Contrast  Result Date: 01/19/2020 CLINICAL DATA:  [place] [address] down stairs bruising to the left forehead puncture wound EXAM: CT HEAD WITHOUT CONTRAST CT CERVICAL SPINE WITHOUT CONTRAST TECHNIQUE: Multidetector CT imaging of the head and cervical spine was performed following the standard protocol without intravenous  contrast. Multiplanar CT image reconstructions of the cervical spine were also generated. COMPARISON:  MRI 12/01/2019, CT brain 07/27/2016 FINDINGS: CT HEAD FINDINGS Brain: No acute territorial infarction, hemorrhage or intracranial mass is visualized. Extensive white matter hypodensity throughout the brain, felt consistent with history of whole brain radiation. Mild atrophy. Stable ventricle size Vascular: No hyperdense vessels.  Carotid vascular calcification Skull: Normal. Negative for fracture or focal lesion. Sinuses/Orbits: No acute finding. Other: Small forehead scalp hematoma CT CERVICAL SPINE FINDINGS Alignment: No subluxation.  Facet alignment is normal. Skull base and vertebrae: No acute fracture. No primary bone lesion or focal pathologic process. Soft tissues and spinal canal: No prevertebral fluid or swelling. No visible canal hematoma. Disc levels: Advanced degenerative changes C4-C5, C5-C6 and C6-C7. Multiple level facet degenerative change. Upper chest: Emphysema Other: None IMPRESSION: 1. No CT evidence for acute intracranial abnormality. 2. Extensive white matter disease, felt consistent with history of whole brain radiation. 3. Degenerative changes of the cervical spine. No acute osseous abnormality 4. Emphysema Electronically Signed   By: Donavan Foil M.D.   On: 01/19/2020 18:25   CT Abdomen Pelvis W Contrast  Result Date: 01/19/2020 CLINICAL DATA:  [place] [address] down concrete steps 2 days ago. Blunt chest and abdominal trauma, with pain and tenderness. Initial encounter. EXAM: CT CHEST, ABDOMEN, AND PELVIS WITH CONTRAST TECHNIQUE: Multidetector CT imaging of the chest, abdomen and pelvis was performed following the standard protocol during bolus administration of intravenous contrast. CONTRAST:  19m OMNIPAQUE IOHEXOL 300 MG/ML  SOLN COMPARISON:  10/23/2019 FINDINGS: CT CHEST FINDINGS Cardiovascular: No evidence of thoracic aortic injury or mediastinal hematoma. No pericardial effusion. Aortic and  coronary artery atherosclerosis incidentally noted. Mediastinum/Nodes: No evidence of pneumomediastinum. No masses or pathologically enlarged lymph nodes identified. Lungs/Pleura: No evidence of pulmonary contusion or other infiltrate. Bibasilar atelectasis or scarring noted. No evidence of pneumothorax or hemothorax. Musculoskeletal: A severe compression fracture of the T7 vertebral body is seen. This was not seen on previous study approximately 3 months ago, but shows sclerosis indicating that it is subacute in age. CT ABDOMEN PELVIS FINDINGS Hepatobiliary: No hepatic laceration or mass identified. Prior cholecystectomy. No evidence of biliary obstruction. Pancreas: No parenchymal laceration, mass, or inflammatory changes identified. Spleen: No evidence of splenic laceration. Adrenal/Urinary Tract: No hemorrhage or parenchymal lacerations identified. No evidence of mass or hydronephrosis. Stomach/Bowel: No evidence of bowel wall thickening or dilatation. No evidence of hemoperitoneum. Diverticulosis is seen mainly involving the sigmoid colon, however there  is no evidence of diverticulitis. A moderate ventral abdominal wall hernia is again seen containing the multiple small bowel loops. No evidence of small bowel obstruction or strangulation. Vascular/Lymphatic: No evidence of abdominal aortic injury or retroperitoneal hemorrhage. Aortic atherosclerosis incidentally noted. No pathologically enlarged lymph nodes identified. Reproductive:  No mass or other significant abnormality identified. Other:  None. Musculoskeletal: No acute fractures or suspicious bone lesions identified. IMPRESSION: 1. Severe compression fracture of T7 vertebral body, likely subacute in age. 2. No evidence of thoracic aortic injury or mediastinal hematoma. 3. No evidence of abdominal organ injury or hemoperitoneum. 4. Moderate ventral abdominal wall hernia containing multiple small bowel loops. 5. Colonic diverticulosis, without radiographic  evidence of diverticulitis. 6. Aortic and coronary artery atherosclerosis. Electronically Signed   By: Marlaine Hind M.D.   On: 01/19/2020 18:31    PERFORMANCE STATUS (ECOG) : 2-3  Review of Systems Unless otherwise noted, a complete review of systems is negative.  Physical Exam General: NAD, frail appearing, thin Pulmonary: Unlabored Extremities: no edema, no joint deformities Skin: no rashes Neurological: Weakness but otherwise nonfocal  IMPRESSION: I met with patient in the infusion area.   Patient reports that she had a fall about 2 weeks ago.  She was trying to ambulate up her steps at her apartment and her legs gave out causing her to fall forward.  She struck her head did not sustain significant injury.  Patient says that she has felt generally more weak.  She does ambulate with use of a walker but often finds that she has nowhere to sit when she is out of the community.  I think patient would benefit from a Rollator with a seat.  We will see if we can order one from DME supplier.  Will refer to [facility] to follow.   Patient has had worsening pain following her fall.  Her transdermal fentanyl was increased today by Dr. Tasia Catchings.  Patient says that she has been trying to conserve the Norco due to limited supply.  The frequency of the Norco has also been increased.  PLAN: -Continue current scope of treatment -Continue transdermal fentanyl with use of as needed Norco for breakthrough pain -Referral to [place] walker due to weakness -RTC in 3 weeks   Patient expressed understanding and was in agreement with this plan. She also understands that She can call the clinic at any time with any questions, concerns, or complaints.     Time Total: 15 minutes  Visit consisted of counseling and education dealing with the complex and emotionally intense issues of symptom management and palliative care in the setting of serious and potentially life-threatening illness.Greater than 50%   of this time was spent counseling and coordinating care related to the above assessment and plan.  Signed by: Altha Harm, PhD, NP-C [phone] (Work Cell)

## 2020-02-12 NOTE — Progress Notes (Signed)
Hematology/Oncology Follow up note [facility] [phone] [phone]   Patient Care Team: Glean Hess, MD as PCP - General (Internal Medicine) Charolette Forward, MD as Consulting Physician (Cardiology) Telford Nab, RN as Registered Nurse  REFERRING PROVIDER: Dr. Felicie Morn REASON FOR VISIT:  Follow-up for small cell lung cancer,   HISTORY OF PRESENTING ILLNESS:  Kristi [name] is a  74 y.o.  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  10/20/2018 CT chest with contrast showed large mediastinal mass involving both hilar, left greater than right, consistent with lung carcinoma, The mass causes narrowing of the left mainstem bronchus with resultant volume loss on the left and a mediastinal shift to the left.  Moderate size left pleural effusion. 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  # MRI brain negative.  # Nov 2019- Jan 2020 s/p 4 cycles of Carbo/Etoposide/Tecentriq #  01/24/2019 interim CT scan done which showed continued positive response to therapy with continued reduction in mediastinal adenopathy.  No residual measurable left lung mass. CT findings of acute emphysematous cystitis. Urology did not feel that patient has pyelonephritis and recommend treatment with antibiotics because patient's immunocompromise. Patient finished treatment.   # Chest and whole brain radiation finished in May 2020 # 04/25/2019 resume Tecentriq every 3 weeks. # Patient had a fall from her stairs on 01/19/2020. In the emergency room she had CT chest abdomen pelvis CT, CT head without contrast, CT cervical spine without contrast. No CT evidence for acute intracranial abnormality, degenerative changes of cervical spine..  No CT evidence of acute thoracic abdomen injury.  Severe compression fracture of T7,  subacute.   INTERVAL HISTORY Kristi [name] is a 74 y.o. female who has above history reviewed by me today presents for evaluation prior to immunotherapy for treatment of extensive small cell lung cancer. Currently patient is on immunotherapy maintenance. Patient denies any unintentional weight loss, cough, shortness of breath more than her baseline. She continues to have mid back pain and anterior chest wall soreness since her most recent fall episodes She reports using fentanyl patch 25 MCG per hour every 72 hours. She takes Norco 5/325 mg once daily as needed for pain medication  She rates her pain about 8 out of 10 being only on fentanyl patch.  When she takes Norco she feels good pain relief.   Review of Systems  Constitutional: Positive for fatigue. Negative for appetite change, chills and fever.  HENT:   Negative for hearing loss and voice change.   Eyes: Negative for eye problems.  Respiratory: Negative for chest tightness, cough and shortness of breath.   Cardiovascular: Negative for chest pain.       Chest wall soreness  Gastrointestinal: Negative for abdominal distention, abdominal pain, blood in stool, diarrhea and nausea.  Endocrine: Negative for hot flashes.  Genitourinary: Negative for difficulty urinating and frequency.   Musculoskeletal: Positive for back pain. Negative for arthralgias.  Skin: Negative for itching and rash.  Neurological: Negative for extremity weakness.  Hematological: Negative for adenopathy.  Psychiatric/Behavioral: Negative for confusion and depression. The patient is not nervous/anxious.        Forgetful     MEDICAL HISTORY:  Past Medical History:  Diagnosis Date  . Acute MI, inferoposterior wall ([place]) 09/30/2014  . Claustrophobia   . Coronary artery disease   . Hypercholesteremia   . Hypertension   .  MI, old   . 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 [facility]) 10/27/2018    SURGICAL HISTORY: Past Surgical History:  Procedure Laterality Date   . APPENDECTOMY     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  . BLADDER NECK SUSPENSION    . CHOLECYSTECTOMY N/A 08/14/2018   Procedure: LAPAROSCOPIC CHOLECYSTECTOMY;  Surgeon: Jules Husbands, MD;  Location: [facility] ORS;  Service: General;  Laterality: N/A;  . CHOLECYSTECTOMY  11/2018  . CORONARY STENT PLACEMENT    . ENDOBRONCHIAL ULTRASOUND N/A 10/21/2018   Procedure: ENDOBRONCHIAL ULTRASOUND;  Surgeon: Laverle Hobby, MD;  Location: [facility] ORS;  Service: Pulmonary;  Laterality: N/A;  . HERNIA REPAIR    . IR FLUORO GUIDE CV LINE RIGHT  12/19/2018  . LEFT HEART CATHETERIZATION WITH CORONARY ANGIOGRAM N/A 09/29/2014   Procedure: LEFT HEART CATHETERIZATION WITH CORONARY ANGIOGRAM;  Surgeon: Clent Demark, MD;  Location: [facility] CATH LAB;  Service: Cardiovascular;  Laterality: N/A;  . PORTA CATH INSERTION N/A 01/02/2019   Procedure: PORTA CATH INSERTION;  Surgeon: Algernon Huxley, MD;  Location: [place] CV LAB;  Service: Cardiovascular;  Laterality: N/A;  . PORTACATH PLACEMENT Right 10/28/2018   Procedure: INSERTION PORT-A-CATH;  Surgeon: Jules Husbands, MD;  Location: [facility] ORS;  Service: General;  Laterality: Right;    SOCIAL HISTORY: Social History   Socioeconomic History  . Marital status: Married    Spouse name: [name]   . Number of children: 3  . Years of education: Not on file  . Highest education level: Not on file  Occupational History  . Occupation: Retired    Comment: Chief Technology Officer   Tobacco Use  . Smoking status: Former Smoker    Packs/day: 1.00    Years: 39.00    Pack years: 39.00    Types: Cigarettes    Start date: 12/21/1978    Quit date: 10/16/2018    Years since quitting: 1.3  . Smokeless tobacco: Never Used  Substance and Sexual Activity  . Alcohol use: Yes    Alcohol/week: 0.0 standard drinks    Comment: occassional - approx 1 every 2 weeks   . Drug use: No  . Sexual activity: Yes    Birth control/protection: None  Other Topics Concern  . Not on file  Social History  Narrative  . Not on file   Social Determinants of Health   Financial Resource Strain:   . Difficulty of Paying Living Expenses: Not on file  Food Insecurity:   . Worried About Charity fundraiser in the Last Year: Not on file  . Ran Out of Food in the Last Year: Not on file  Transportation Needs:   . Lack of Transportation (Medical): Not on file  . Lack of Transportation (Non-Medical): Not on file  Physical Activity:   . Days of Exercise per Week: Not on file  . Minutes of Exercise per Session: Not on file  Stress:   . Feeling of Stress : Not on file  Social Connections:   . Frequency of Communication with Friends and Family: Not on file  . Frequency of Social Gatherings with Friends and Family: Not on file  . Attends Religious Services: Not on file  . Active Member of Clubs or Organizations: Not on file  . Attends Archivist Meetings: Not on file  . Marital Status: Not on file  Intimate Partner Violence:   . Fear of Current or Ex-Partner: Not on file  . Emotionally Abused: Not on file  . Physically Abused:  Not on file  . Sexually Abused: Not on file    FAMILY HISTORY: Family History  Problem Relation Age of Onset  . Alzheimer's disease Mother   . Colon cancer Father   . Breast cancer Neg Hx     ALLERGIES:  has No Known Allergies.  MEDICATIONS:  Current Outpatient Medications  Medication Sig Dispense Refill  . atorvastatin (LIPITOR) 80 MG tablet Take 0.5 tablets (40 mg total) by mouth daily at 6 PM. 30 tablet 3  . CALCIUM PO Take 1 tablet by mouth daily.     . Cholecalciferol (VITAMIN D) 50 MCG (2000 UT) CAPS Take 1 capsule by mouth daily.    . diphenhydrAMINE-zinc acetate (BENADRYL EXTRA STRENGTH) cream Apply 1 application topically 3 (three) times daily as needed for itching. 28.4 g 0  . fentaNYL (DURAGESIC) 25 MCG/HR Place 1 patch onto the skin every 3 (three) days. 10 patch 0  . HYDROcodone-acetaminophen (NORCO/VICODIN) 5-325 MG tablet Take 1 tablet by  mouth every 6 (six) hours as needed for moderate pain. 90 tablet 0  . lidocaine-prilocaine (EMLA) cream Apply to affected area once 30 g 3  . metoprolol tartrate (LOPRESSOR) 25 MG tablet Take 12.5 mg by mouth 2 (two) times daily.    . nitroGLYCERIN (NITROSTAT) 0.4 MG SL tablet Place 1 tablet (0.4 mg total) under the tongue every 5 (five) minutes x 3 doses as needed for chest pain. 25 tablet 12  . omeprazole (PRILOSEC) 40 MG capsule TAKE ONE CAPSULE BY MOUTH DAILY 90 capsule 1  . ondansetron (ZOFRAN) 8 MG tablet Take 1 tablet (8 mg total) by mouth 2 (two) times daily as needed for refractory nausea / vomiting. Start on day 3 after carboplatin chemo. 30 tablet 1  . polyethylene glycol (MIRALAX / GLYCOLAX) packet Take 17 g by mouth daily as needed.     . prochlorperazine (COMPAZINE) 10 MG tablet TAKE 1 TABLET BY MOUTH EVERY 6 HOURS AS NEEDED FOR NAUSEA OR VOMITING 270 tablet 1  . sertraline (ZOLOFT) 50 MG tablet Take 1.5 tablets (75 mg total) by mouth daily. 45 tablet 1  . triamterene-hydrochlorothiazide (DYAZIDE) 37.5-25 MG capsule Take 1 each (1 capsule total) by mouth daily as needed. 90 capsule 0  . vitamin B-12 (CYANOCOBALAMIN) 1000 MCG tablet Take 1,000 mcg by mouth daily.     No current facility-administered medications for this visit.   Facility-Administered Medications Ordered in Other Visits  Medication Dose Route Frequency Provider Last Rate Last Admin  . heparin lock flush 100 unit/mL  500 Units Intravenous Once Earlie Server, MD      . heparin lock flush 100 unit/mL  500 Units Intravenous Once Earlie Server, MD      . sodium chloride flush (NS) 0.9 % injection 10 mL  10 mL Intravenous PRN Earlie Server, MD   10 mL at 01/09/19 0820  . sodium chloride flush (NS) 0.9 % injection 10 mL  10 mL Intravenous PRN Earlie Server, MD   10 mL at 01/10/19 1400     PHYSICAL EXAMINATION: ECOG PERFORMANCE STATUS: 1 - Symptomatic but completely ambulatory Vitals:   02/12/20 0934  BP: 113/77  Pulse: 89  Resp: 16    Temp: 98.4 F (36.9 C)   Filed Weights   02/12/20 0934  Weight: 144 lb 8 oz (65.5 kg)   Physical Exam  Constitutional: She is oriented to person, place, and time. No distress.  HENT:  Head: Normocephalic and atraumatic.  Nose: Nose normal.  Mouth/Throat: Oropharynx is clear and  moist. No oropharyngeal exudate.  Eyes: Pupils are equal, round, and reactive to light. EOM are normal. No scleral icterus.  Cardiovascular: Normal rate and regular rhythm.  No murmur heard. Pulmonary/Chest: Effort normal. No respiratory distress. She has no rales. She exhibits no tenderness.  Decreased breath sounds bilaterally, right side breath sound is more diminished.   Abdominal: Soft. She exhibits no distension. There is no abdominal tenderness.  Musculoskeletal:        General: No edema. Normal range of motion.     Cervical back: Normal range of motion and neck supple.  Neurological: She is alert and oriented to person, place, and time. No cranial nerve deficit. She exhibits normal muscle tone. Coordination normal.  Skin: Skin is warm and dry. She is not diaphoretic. No erythema.  Facial bruising, and abrasion of left forehead  Psychiatric: Affect normal.       LABORATORY DATA:  I have reviewed the data as listed Lab Results  Component Value Date   WBC 5.4 02/12/2020   HGB 12.1 02/12/2020   HCT 36.3 02/12/2020   MCV 88.5 02/12/2020   PLT 159 02/12/2020   Recent Labs    01/01/20 0856 01/01/20 0856 01/19/20 1542 01/22/20 0850 02/12/20 0921  NA 138   < > 137 135 138  K 3.9   < > 3.7 3.7 3.7  CL 103   < > 104 102 105  CO2 25   < > 24 22 24   GLUCOSE 107*   < > 103* 106* 104*  BUN 18   < > 11 14 16   CREATININE 0.75   < > 0.70 0.87 0.76  CALCIUM 9.6   < > 9.3 9.4 9.1  GFRNONAA >60   < > >60 >60 >60  GFRAA >60   < > >60 >60 >60  PROT 7.3  --   --  7.3 7.1  ALBUMIN 4.1  --   --  4.1 4.0  AST 15  --   --  15 13*  ALT 12  --   --  12 10  ALKPHOS 63  --   --  62 55  BILITOT 0.7  --    --  0.6 0.5   < > = values in this interval not displayed.    RADIOGRAPHIC STUDIES: I have personally reviewed the radiological images as listed and agreed with the findings in the report.  DG Wrist Complete Left  Result Date: 01/19/2020 CLINICAL DATA:  [place] down 5 stairs 2 days ago with left wrist pain. EXAM: LEFT WRIST - COMPLETE 3+ VIEW COMPARISON:  None. FINDINGS: Mild degenerate change over the radiocarpal joint and first carpometacarpal joints. Mild osteopenia is present. No evidence of acute fracture or dislocation. IMPRESSION: No acute fracture. Electronically Signed   By: Marin Olp M.D.   On: 01/19/2020 15:04   CT Head Wo Contrast  Result Date: 01/19/2020 CLINICAL DATA:  [place] [address] down stairs bruising to the left forehead puncture wound EXAM: CT HEAD WITHOUT CONTRAST CT CERVICAL SPINE WITHOUT CONTRAST TECHNIQUE: Multidetector CT imaging of the head and cervical spine was performed following the standard protocol without intravenous contrast. Multiplanar CT image reconstructions of the cervical spine were also generated. COMPARISON:  MRI 12/01/2019, CT brain 07/27/2016 FINDINGS: CT HEAD FINDINGS Brain: No acute territorial infarction, hemorrhage or intracranial mass is visualized. Extensive white matter hypodensity throughout the brain, felt consistent with history of whole brain radiation. Mild atrophy. Stable ventricle size Vascular: No hyperdense vessels.  Carotid vascular calcification Skull: Normal.  Negative for fracture or focal lesion. Sinuses/Orbits: No acute finding. Other: Small forehead scalp hematoma CT CERVICAL SPINE FINDINGS Alignment: No subluxation.  Facet alignment is normal. Skull base and vertebrae: No acute fracture. No primary bone lesion or focal pathologic process. Soft tissues and spinal canal: No prevertebral fluid or swelling. No visible canal hematoma. Disc levels: Advanced degenerative changes C4-C5, C5-C6 and C6-C7. Multiple level facet degenerative change. Upper  chest: Emphysema Other: None IMPRESSION: 1. No CT evidence for acute intracranial abnormality. 2. Extensive white matter disease, felt consistent with history of whole brain radiation. 3. Degenerative changes of the cervical spine. No acute osseous abnormality 4. Emphysema Electronically Signed   By: Donavan Foil M.D.   On: 01/19/2020 18:25   CT Chest W Contrast  Result Date: 01/19/2020 CLINICAL DATA:  [place] down concrete steps 2 days ago. Blunt chest and abdominal trauma, with pain and tenderness. Initial encounter. EXAM: CT CHEST, ABDOMEN, AND PELVIS WITH CONTRAST TECHNIQUE: Multidetector CT imaging of the chest, abdomen and pelvis was performed following the standard protocol during bolus administration of intravenous contrast. CONTRAST:  158mL OMNIPAQUE IOHEXOL 300 MG/ML  SOLN COMPARISON:  10/23/2019 FINDINGS: CT CHEST FINDINGS Cardiovascular: No evidence of thoracic aortic injury or mediastinal hematoma. No pericardial effusion. Aortic and coronary artery atherosclerosis incidentally noted. Mediastinum/Nodes: No evidence of pneumomediastinum. No masses or pathologically enlarged lymph nodes identified. Lungs/Pleura: No evidence of pulmonary contusion or other infiltrate. Bibasilar atelectasis or scarring noted. No evidence of pneumothorax or hemothorax. Musculoskeletal: A severe compression fracture of the T7 vertebral body is seen. This was not seen on previous study approximately 3 months ago, but shows sclerosis indicating that it is subacute in age. CT ABDOMEN PELVIS FINDINGS Hepatobiliary: No hepatic laceration or mass identified. Prior cholecystectomy. No evidence of biliary obstruction. Pancreas: No parenchymal laceration, mass, or inflammatory changes identified. Spleen: No evidence of splenic laceration. Adrenal/Urinary Tract: No hemorrhage or parenchymal lacerations identified. No evidence of mass or hydronephrosis. Stomach/Bowel: No evidence of bowel wall thickening or dilatation. No evidence of  hemoperitoneum. Diverticulosis is seen mainly involving the sigmoid colon, however there is no evidence of diverticulitis. A moderate ventral abdominal wall hernia is again seen containing the multiple small bowel loops. No evidence of small bowel obstruction or strangulation. Vascular/Lymphatic: No evidence of abdominal aortic injury or retroperitoneal hemorrhage. Aortic atherosclerosis incidentally noted. No pathologically enlarged lymph nodes identified. Reproductive:  No mass or other significant abnormality identified. Other:  None. Musculoskeletal: No acute fractures or suspicious bone lesions identified. IMPRESSION: 1. Severe compression fracture of T7 vertebral body, likely subacute in age. 2. No evidence of thoracic aortic injury or mediastinal hematoma. 3. No evidence of abdominal organ injury or hemoperitoneum. 4. Moderate ventral abdominal wall hernia containing multiple small bowel loops. 5. Colonic diverticulosis, without radiographic evidence of diverticulitis. 6. Aortic and coronary artery atherosclerosis. Electronically Signed   By: Marlaine Hind M.D.   On: 01/19/2020 18:31   CT Cervical Spine Wo Contrast  Result Date: 01/19/2020 CLINICAL DATA:  [place] down stairs bruising to the left forehead puncture wound EXAM: CT HEAD WITHOUT CONTRAST CT CERVICAL SPINE WITHOUT CONTRAST TECHNIQUE: Multidetector CT imaging of the head and cervical spine was performed following the standard protocol without intravenous contrast. Multiplanar CT image reconstructions of the cervical spine were also generated. COMPARISON:  MRI 12/01/2019, CT brain 07/27/2016 FINDINGS: CT HEAD FINDINGS Brain: No acute territorial infarction, hemorrhage or intracranial mass is visualized. Extensive white matter hypodensity throughout the brain, felt consistent with history of whole brain radiation.  Mild atrophy. Stable ventricle size Vascular: No hyperdense vessels.  Carotid vascular calcification Skull: Normal. Negative for fracture  or focal lesion. Sinuses/Orbits: No acute finding. Other: Small forehead scalp hematoma CT CERVICAL SPINE FINDINGS Alignment: No subluxation.  Facet alignment is normal. Skull base and vertebrae: No acute fracture. No primary bone lesion or focal pathologic process. Soft tissues and spinal canal: No prevertebral fluid or swelling. No visible canal hematoma. Disc levels: Advanced degenerative changes C4-C5, C5-C6 and C6-C7. Multiple level facet degenerative change. Upper chest: Emphysema Other: None IMPRESSION: 1. No CT evidence for acute intracranial abnormality. 2. Extensive white matter disease, felt consistent with history of whole brain radiation. 3. Degenerative changes of the cervical spine. No acute osseous abnormality 4. Emphysema Electronically Signed   By: Donavan Foil M.D.   On: 01/19/2020 18:25   MR Brain W Wo Contrast  Result Date: 12/01/2019 CLINICAL DATA:  74 year old female with history of small cell lung cancer. Status post prophylactic cranial radiation completed in May or June this year. Restaging. EXAM: MRI HEAD WITHOUT AND WITH CONTRAST TECHNIQUE: Multiplanar, multiecho pulse sequences of the brain and surrounding structures were obtained without and with intravenous contrast. CONTRAST:  34mL GADAVIST GADOBUTROL 1 MMOL/ML IV SOLN COMPARISON:  Brain MRI 10/28/2018. FINDINGS: Brain: No restricted diffusion to suggest acute infarction. No midline shift, mass effect, evidence of mass lesion, ventriculomegaly, extra-axial collection or acute intracranial hemorrhage. Cervicomedullary junction and pituitary are within normal limits. No abnormal enhancement identified. No dural thickening. Progressed and now widespread bilateral cerebral white matter T2 and FLAIR hyperintensity, involving the deep white matter capsules also. Cerebral volume appears stable. Similar patchy and confluent new or increased T2 signal in the pons and deep cerebellar nuclei. No cortical encephalomalacia or chronic cerebral  blood products identified. Vascular: Major intracranial vascular flow voids are stable, with loss of the distal left vertebral artery flow void as noted in 2019. The major dural venous sinuses are enhancing and appear to be patent. Skull and upper cervical spine: Negative visible cervical spine aside from degenerative changes. Negative visible spinal cord. Visualized bone marrow signal is within normal limits. Sinuses/Orbits: Stable and negative. Other: Trace left mastoid fluid is stable. Visible internal auditory structures appear normal. Scalp and face soft tissues appear negative. IMPRESSION: Sequelae of whole brain radiation. No metastatic disease or acute intracranial abnormality. Electronically Signed   By: Genevie Ann M.D.   On: 12/01/2019 11:45   MR Thoracic Spine W Wo Contrast  Result Date: 12/01/2019 CLINICAL DATA:  Upper back pain for approximately 3 weeks. The patient has suffered several falls. EXAM: MRI THORACIC WITHOUT AND WITH CONTRAST TECHNIQUE: Multiplanar and multiecho pulse sequences of the thoracic spine were obtained without and with intravenous contrast. CONTRAST:  6 mL GADAVIST IV SOLN COMPARISON:  CT chest, abdomen and pelvis 07/27/2019 and 10/23/2019. FINDINGS: MRI THORACIC SPINE FINDINGS Alignment:  Maintained.  Exaggeration of thoracic kyphosis is noted. Vertebrae: The patient has a severe compression fracture of T7 with vertebra plana deformity identified. There is marrow edema and enhancement in the vertebral body. Mild marrow edema in the T7 pedicles is greater on the right. There was a mild superior endplate compression fracture at T7 on the 10/23/2019 CT and T7 was intact on the 07/27/2019 CT. Minimal marrow edema is seen in the anterior aspect of the inferior endplate of T6. Cord:  Normal signal throughout. Paraspinal and other soft tissues: Negative. Disc levels: T1-2 through T5-6 are negative. T6-7: Mild bony retropulsion is seen off the posterior margin  of T7 but there is no  stenosis. No epidural hematoma is identified. T8-9: Shallow left paracentral protrusion indents the ventral thecal sac but the central canal and foramina are open. T9-10: Shallow right paracentral protrusion without stenosis. T10-11: Minimal disc bulge without stenosis. T11-12: Minimal disc bulge without stenosis. T12-L1: Negative. 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 There is mild bony retropulsion off the posterior margin of T7 but no stenosis. Small focus of marrow edema in the anterior, inferior endplate of T6 may be due to contusion without fracture. 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 Electronically Signed   By: Inge Rise M.D.   On: 12/01/2019 11:29   CT Abdomen Pelvis W Contrast  Result Date: 01/19/2020 CLINICAL DATA:  [place] down concrete steps 2 days ago. Blunt chest and abdominal trauma, with pain and tenderness. Initial encounter. EXAM: CT CHEST, ABDOMEN, AND PELVIS WITH CONTRAST TECHNIQUE: Multidetector CT imaging of the chest, abdomen and pelvis was performed following the standard protocol during bolus administration of intravenous contrast. CONTRAST:  136mL OMNIPAQUE IOHEXOL 300 MG/ML  SOLN COMPARISON:  10/23/2019 FINDINGS: CT CHEST FINDINGS Cardiovascular: No evidence of thoracic aortic injury or mediastinal hematoma. No pericardial effusion. Aortic and coronary artery atherosclerosis incidentally noted. Mediastinum/Nodes: No evidence of pneumomediastinum. No masses or pathologically enlarged lymph nodes identified. Lungs/Pleura: No evidence of pulmonary contusion or other infiltrate. Bibasilar atelectasis or scarring noted. No evidence of pneumothorax or hemothorax. Musculoskeletal: A severe compression fracture of the T7  vertebral body is seen. This was not seen on previous study approximately 3 months ago, but shows sclerosis indicating that it is subacute in age. CT ABDOMEN PELVIS FINDINGS Hepatobiliary: No hepatic laceration or mass identified. Prior cholecystectomy. No evidence of biliary obstruction. Pancreas: No parenchymal laceration, mass, or inflammatory changes identified. Spleen: No evidence of splenic laceration. Adrenal/Urinary Tract: No hemorrhage or parenchymal lacerations identified. No evidence of mass or hydronephrosis. Stomach/Bowel: No evidence of bowel wall thickening or dilatation. No evidence of hemoperitoneum. Diverticulosis is seen mainly involving the sigmoid colon, however there is no evidence of diverticulitis. A moderate ventral abdominal wall hernia is again seen containing the multiple small bowel loops. No evidence of small bowel obstruction or strangulation. Vascular/Lymphatic: No evidence of abdominal aortic injury or retroperitoneal hemorrhage. Aortic atherosclerosis incidentally noted. No pathologically enlarged lymph nodes identified. Reproductive:  No mass or other significant abnormality identified. Other:  None. Musculoskeletal: No acute fractures or suspicious bone lesions identified. IMPRESSION: 1. Severe compression fracture of T7 vertebral body, likely subacute in age. 2. No evidence of thoracic aortic injury or mediastinal hematoma. 3. No evidence of abdominal organ injury or hemoperitoneum. 4. Moderate ventral abdominal wall hernia containing multiple small bowel loops. 5. Colonic diverticulosis, without radiographic evidence of diverticulitis. 6. Aortic and coronary artery atherosclerosis. Electronically Signed   By: Marlaine Hind M.D.   On: 01/19/2020 18:31     ASSESSMENT & PLAN:  1. Small cell lung cancer ([place])   2. Encounter for antineoplastic immunotherapy   3. Thoracic compression fracture, closed, initial encounter ([place])   4. Frequent falls   5. Osteopenia, unspecified  location    #Extensive small cell lung cancer S/p 4 cycles of Carboplatin, Etoposide and Tecentriq.   Status post chest and whole brain radiation.  Labs reviewed and discussed with patient .  Her  counts acceptable to proceed with Tecentriq maintenance treatment today.  #T7 compression fracture without evidence of underlying pathological osseous lesions. Continue fentanyl patch 25 MCG per hour every 72 hours. Advised patient to increase Norco to every 6 hours as needed. New prescription was sent to her pharmacy. Continue follow-up with palliative care  #Osteopenia continue calcium and vitamin D supplementation. Bone metastasis, proceed with Zometa every 4 to 6 weeks.  She will receive Zometa at the next visit. #Frequent falls, continue physical therapy. Depression has been well controlled.  Continue Zoloft... discussed with Altha Harm.  Patient will continue follow-up with palliative care service. Follow up in 3 weeks for eval with next cycle of tecentriq and Zometa.   Earlie Server, MD, PhD

## 2020-02-12 NOTE — Progress Notes (Signed)
Patient reports he back pain has not improved with the addition of the pain patch.  Her back pain is 8/10 today.  She does not take an oral steroid pill.

## 2020-02-16 ENCOUNTER — Telehealth: Payer: Self-pay

## 2020-02-16 NOTE — Telephone Encounter (Signed)
Telephone call to patient to schedule palliative care visit with patient. Patient/family in agreement with home visit on 02-20-20 at 11:30AM.

## 2020-02-19 ENCOUNTER — Telehealth: Payer: Self-pay

## 2020-02-19 NOTE — Telephone Encounter (Signed)
Telephone call from patient to reschedule palliative care visit. Patient/family in agreement with home visit on 02-26-20 at 12:00PM.

## 2020-02-20 ENCOUNTER — Other Ambulatory Visit: Payer: Medicare Other

## 2020-02-21 ENCOUNTER — Inpatient Hospital Stay: Payer: Medicare Other | Attending: Oncology | Admitting: Occupational Therapy

## 2020-02-23 DIAGNOSIS — S22000A Wedge compression fracture of unspecified thoracic vertebra, initial encounter for closed fracture: Secondary | ICD-10-CM | POA: Diagnosis not present

## 2020-02-26 ENCOUNTER — Telehealth: Payer: Self-pay

## 2020-02-26 ENCOUNTER — Other Ambulatory Visit: Payer: Self-pay

## 2020-02-26 ENCOUNTER — Other Ambulatory Visit: Payer: Medicare Other

## 2020-02-26 NOTE — Telephone Encounter (Signed)
SW contacted patient to confirm appointment. Patient requested palliative care team to do TELEHEALTH visit versus in-person visit. SW agreed and Chiropodist. SW and RN contacted patient at scheduled time, no answer. SW and RN waited and tried to contact patient again thirty minutes later. Notified palliative admin.

## 2020-02-28 ENCOUNTER — Other Ambulatory Visit: Payer: Self-pay

## 2020-02-28 ENCOUNTER — Other Ambulatory Visit
Admission: RE | Admit: 2020-02-28 | Discharge: 2020-02-28 | Disposition: A | Discharge status: Home / Self Care | Payer: Medicare Other | Source: Ambulatory Visit | Attending: Orthopedic Surgery | Admitting: Orthopedic Surgery

## 2020-02-28 HISTORY — DX: Gastro-esophageal reflux disease without esophagitis: K21.9

## 2020-02-28 HISTORY — DX: Pneumonia, unspecified organism: J18.9

## 2020-02-28 NOTE — Patient Instructions (Addendum)
Your procedure is scheduled on: Friday, March 12 Report to Day Surgery on the 2nd floor of the [facility]. To find out your arrival time, please call [phone] between 1PM - 3PM on: Thursday, March 11  REMEMBER: Instructions that are not followed completely may result in serious medical risk, up to and including death; or upon the discretion of your surgeon and anesthesiologist your surgery may need to be rescheduled.  Do not eat food after midnight the night before surgery.  No gum chewing, lozengers or hard candies.  You may however, drink CLEAR liquids up to 2 hours before you are scheduled to arrive for your surgery. Do not drink anything within 2 hours of the start of your surgery.  Clear liquids include: - water  - apple juice without pulp - gatorade (not RED) - black coffee or tea (Do NOT add milk or creamers to the coffee or tea) Do NOT drink anything that is not on this list.  TAKE THESE MEDICATIONS THE MORNING OF SURGERY WITH A SIP OF WATER:  1.  Hydrocodone or Tylenol as needed for pain 2.  Omeprazole - (take one the night before and one on the morning of surgery - helps to prevent nausea after surgery.) 3.  Sertraline  Stop Anti-inflammatories (NSAIDS) such as Advil, Aleve, Ibuprofen, Motrin, Naproxen, Naprosyn and Aspirin based products such as Excedrin, Goodys Powder, BC Powder. (May take Tylenol or Acetaminophen if needed.)  Stop ANY OVER THE COUNTER supplements until after surgery. (May continue Vitamin D, Vitamin B.)  No Alcohol for 24 hours before or after surgery.  No Smoking including e-cigarettes for 24 hours prior to surgery.  No chewable tobacco products for at least 6 hours prior to surgery.  No nicotine patches on the day of surgery.  On the morning of surgery brush your teeth with toothpaste and water, you may rinse your mouth with mouthwash if you wish. Do not swallow any toothpaste or mouthwash.  Do not wear jewelry, make-up, hairpins, clips  or nail polish.  Do not wear lotions, powders, or perfumes.   Do not shave 48 hours prior to surgery.   Contact lenses, hearing aids and dentures may not be worn into surgery.  Do not bring valuables to the hospital, including drivers license, insurance or credit cards.  [facility] is not responsible for any belongings or valuables.   Use CHG Soap as directed on instruction sheet.  Notify your doctor if there is any change in your medical condition (cold, fever, infection).  Wear comfortable clothing (specific to your surgery type) to the hospital.  If you are being discharged the day of surgery, you will not be allowed to drive home. You will need a responsible adult to drive you home and stay with you that night.   If you are taking public transportation, you will need to have a responsible adult with you. Please confirm with your physician that it is acceptable to use public transportation.   Please call [phone] if you have any questions about these instructions.  Visitation Policy:  Patients undergoing a surgery or procedure in a hospital may have one family member or support person with them as long as that person is not COVID-19 positive or experiencing its symptoms. That person may remain in the waiting area during the procedure. Should the patient need to stay at the hospital during part of their recovery, the support person may visit during visiting hours; 10 am to 8 pm.

## 2020-02-29 ENCOUNTER — Other Ambulatory Visit: Payer: Self-pay | Admitting: Orthopedic Surgery

## 2020-02-29 ENCOUNTER — Other Ambulatory Visit
Admission: RE | Admit: 2020-02-29 | Discharge: 2020-02-29 | Disposition: A | Discharge status: Home / Self Care | Payer: Medicare Other | Source: Ambulatory Visit | Attending: Orthopedic Surgery | Admitting: Orthopedic Surgery

## 2020-02-29 DIAGNOSIS — Z20822 Contact with and (suspected) exposure to covid-19: Secondary | ICD-10-CM | POA: Insufficient documentation

## 2020-02-29 DIAGNOSIS — Z01812 Encounter for preprocedural laboratory examination: Secondary | ICD-10-CM | POA: Insufficient documentation

## 2020-02-29 LAB — SARS CORONAVIRUS 2 (TAT 6-24 HRS): SARS Coronavirus 2: NEGATIVE

## 2020-02-29 NOTE — Progress Notes (Signed)
Did not show up for covid testing yet, called and left message on the patient's phone. Also called Dr. Rudene Christians office to inform them of this.

## 2020-03-01 ENCOUNTER — Encounter: Payer: Self-pay | Admitting: Orthopedic Surgery

## 2020-03-01 ENCOUNTER — Ambulatory Visit: Payer: Medicare Other | Admitting: Family

## 2020-03-01 ENCOUNTER — Ambulatory Visit
Admission: RE | Admit: 2020-03-01 | Discharge: 2020-03-01 | Disposition: A | Discharge status: Home / Self Care | Payer: Medicare Other | Attending: Orthopedic Surgery | Admitting: Orthopedic Surgery

## 2020-03-01 ENCOUNTER — Encounter
Admission: RE | Disposition: A | Discharge status: Home / Self Care | Payer: Self-pay | Source: Home / Self Care | Attending: Orthopedic Surgery

## 2020-03-01 ENCOUNTER — Other Ambulatory Visit: Payer: Self-pay

## 2020-03-01 ENCOUNTER — Ambulatory Visit: Payer: Medicare Other

## 2020-03-01 DIAGNOSIS — E7800 Pure hypercholesterolemia, unspecified: Secondary | ICD-10-CM | POA: Diagnosis not present

## 2020-03-01 DIAGNOSIS — I2510 Atherosclerotic heart disease of native coronary artery without angina pectoris: Secondary | ICD-10-CM | POA: Diagnosis not present

## 2020-03-01 DIAGNOSIS — I252 Old myocardial infarction: Secondary | ICD-10-CM | POA: Diagnosis not present

## 2020-03-01 DIAGNOSIS — X58XXXA Exposure to other specified factors, initial encounter: Secondary | ICD-10-CM | POA: Insufficient documentation

## 2020-03-01 DIAGNOSIS — Z79899 Other long term (current) drug therapy: Secondary | ICD-10-CM | POA: Diagnosis not present

## 2020-03-01 DIAGNOSIS — E785 Hyperlipidemia, unspecified: Secondary | ICD-10-CM | POA: Diagnosis not present

## 2020-03-01 DIAGNOSIS — Z981 Arthrodesis status: Secondary | ICD-10-CM | POA: Diagnosis not present

## 2020-03-01 DIAGNOSIS — S22060A Wedge compression fracture of T7-T8 vertebra, initial encounter for closed fracture: Secondary | ICD-10-CM | POA: Insufficient documentation

## 2020-03-01 DIAGNOSIS — M4854XA Collapsed vertebra, not elsewhere classified, thoracic region, initial encounter for fracture: Secondary | ICD-10-CM | POA: Diagnosis not present

## 2020-03-01 DIAGNOSIS — Z7982 Long term (current) use of aspirin: Secondary | ICD-10-CM | POA: Diagnosis not present

## 2020-03-01 DIAGNOSIS — Z419 Encounter for procedure for purposes other than remedying health state, unspecified: Secondary | ICD-10-CM

## 2020-03-01 DIAGNOSIS — C3490 Malignant neoplasm of unspecified part of unspecified bronchus or lung: Secondary | ICD-10-CM | POA: Insufficient documentation

## 2020-03-01 DIAGNOSIS — I10 Essential (primary) hypertension: Secondary | ICD-10-CM | POA: Insufficient documentation

## 2020-03-01 DIAGNOSIS — J449 Chronic obstructive pulmonary disease, unspecified: Secondary | ICD-10-CM | POA: Diagnosis not present

## 2020-03-01 DIAGNOSIS — F419 Anxiety disorder, unspecified: Secondary | ICD-10-CM | POA: Diagnosis not present

## 2020-03-01 DIAGNOSIS — J441 Chronic obstructive pulmonary disease with (acute) exacerbation: Secondary | ICD-10-CM | POA: Diagnosis not present

## 2020-03-01 DIAGNOSIS — Z87891 Personal history of nicotine dependence: Secondary | ICD-10-CM | POA: Insufficient documentation

## 2020-03-01 DIAGNOSIS — Z955 Presence of coronary angioplasty implant and graft: Secondary | ICD-10-CM | POA: Insufficient documentation

## 2020-03-01 HISTORY — PX: KYPHOPLASTY: SHX5884

## 2020-03-01 SURGERY — KYPHOPLASTY
Anesthesia: Monitor Anesthesia Care | Site: Back

## 2020-03-01 MED ORDER — SODIUM CHLORIDE FLUSH 0.9 % IV SOLN
INTRAVENOUS | Status: AC
  Filled 2020-03-01: qty 20

## 2020-03-01 MED ORDER — CHLORHEXIDINE GLUCONATE 4 % EX LIQD: 60.0000 mL | Freq: Once | CUTANEOUS | Status: DC

## 2020-03-01 MED ORDER — ONDANSETRON HCL 4 MG/2ML IJ SOLN: 4.0000 mg | Freq: Four times a day (QID) | INTRAMUSCULAR | Status: DC | PRN

## 2020-03-01 MED ORDER — NALOXONE HCL 0.4 MG/ML IJ SOLN
0.2000 mg | Freq: Once | INTRAMUSCULAR | Status: AC
  Administered 2020-03-01: 13:00:00 0.2 mg via INTRAVENOUS
  Filled 2020-03-01: qty 1

## 2020-03-01 MED ORDER — MIDAZOLAM HCL 2 MG/2ML IJ SOLN
INTRAMUSCULAR | Status: AC
  Filled 2020-03-01: qty 2

## 2020-03-01 MED ORDER — SODIUM CHLORIDE 0.9 % IV SOLN: INTRAVENOUS | Status: DC

## 2020-03-01 MED ORDER — ONDANSETRON HCL 4 MG/2ML IJ SOLN: 4.0000 mg | Freq: Once | INTRAMUSCULAR | Status: DC | PRN

## 2020-03-01 MED ORDER — PROPOFOL 10 MG/ML IV BOLUS
INTRAVENOUS | Status: AC
  Filled 2020-03-01: qty 20

## 2020-03-01 MED ORDER — ONDANSETRON HCL 4 MG/2ML IJ SOLN
INTRAMUSCULAR | Status: DC | PRN
  Administered 2020-03-01: 4 mg via INTRAVENOUS

## 2020-03-01 MED ORDER — CEFAZOLIN SODIUM-DEXTROSE 2-4 GM/100ML-% IV SOLN
2.0000 g | INTRAVENOUS | Status: AC
  Administered 2020-03-01: 2 g via INTRAVENOUS

## 2020-03-01 MED ORDER — CEFAZOLIN SODIUM-DEXTROSE 2-4 GM/100ML-% IV SOLN
INTRAVENOUS | Status: AC
  Filled 2020-03-01: qty 100

## 2020-03-01 MED ORDER — EPHEDRINE SULFATE 50 MG/ML IJ SOLN
INTRAMUSCULAR | Status: AC
  Administered 2020-03-01: 13:00:00 20 mg via INTRAVENOUS
  Filled 2020-03-01: qty 1

## 2020-03-01 MED ORDER — EPHEDRINE SULFATE 50 MG/ML IJ SOLN
INTRAMUSCULAR | Status: DC | PRN
  Administered 2020-03-01: 10 mg via INTRAVENOUS

## 2020-03-01 MED ORDER — BUPIVACAINE HCL (PF) 0.5 % IJ SOLN
INTRAMUSCULAR | Status: AC
  Filled 2020-03-01: qty 30

## 2020-03-01 MED ORDER — LACTATED RINGERS IV SOLN: INTRAVENOUS | Status: DC

## 2020-03-01 MED ORDER — PROPOFOL 500 MG/50ML IV EMUL
INTRAVENOUS | Status: DC | PRN
  Administered 2020-03-01 (×3): 10 mg via INTRAVENOUS

## 2020-03-01 MED ORDER — EPHEDRINE SULFATE 50 MG/ML IJ SOLN: 20.0000 mg | Freq: Once | INTRAMUSCULAR | Status: AC

## 2020-03-01 MED ORDER — BUPIVACAINE-EPINEPHRINE (PF) 0.5% -1:200000 IJ SOLN
INTRAMUSCULAR | Status: DC | PRN
  Administered 2020-03-01: 20 mL

## 2020-03-01 MED ORDER — LIDOCAINE HCL 1 % IJ SOLN
INTRAMUSCULAR | Status: DC | PRN
  Administered 2020-03-01: 30 mL

## 2020-03-01 MED ORDER — ONDANSETRON HCL 4 MG PO TABS: 4.0000 mg | ORAL_TABLET | Freq: Four times a day (QID) | ORAL | Status: DC | PRN

## 2020-03-01 MED ORDER — FENTANYL CITRATE (PF) 100 MCG/2ML IJ SOLN
INTRAMUSCULAR | Status: AC
  Filled 2020-03-01: qty 2

## 2020-03-01 MED ORDER — PHENYLEPHRINE HCL (PRESSORS) 10 MG/ML IV SOLN
INTRAVENOUS | Status: DC | PRN
  Administered 2020-03-01 (×3): 100 ug via INTRAVENOUS

## 2020-03-01 MED ORDER — FENTANYL CITRATE (PF) 100 MCG/2ML IJ SOLN
INTRAMUSCULAR | Status: DC | PRN
  Administered 2020-03-01 (×4): 25 ug via INTRAVENOUS

## 2020-03-01 MED ORDER — MIDAZOLAM HCL 2 MG/2ML IJ SOLN
INTRAMUSCULAR | Status: DC | PRN
  Administered 2020-03-01: 1 mg via INTRAVENOUS

## 2020-03-01 MED ORDER — METOCLOPRAMIDE HCL 10 MG PO TABS: 5.0000 mg | ORAL_TABLET | Freq: Three times a day (TID) | ORAL | Status: DC | PRN

## 2020-03-01 MED ORDER — METOCLOPRAMIDE HCL 5 MG/ML IJ SOLN: 5.0000 mg | Freq: Three times a day (TID) | INTRAMUSCULAR | Status: DC | PRN

## 2020-03-01 MED ORDER — NALOXONE HCL 2 MG/2ML IJ SOSY
PREFILLED_SYRINGE | INTRAMUSCULAR | Status: AC
  Administered 2020-03-01: 0.2 mg
  Filled 2020-03-01: qty 2

## 2020-03-01 MED ORDER — FENTANYL CITRATE (PF) 100 MCG/2ML IJ SOLN: 25.0000 ug | INTRAMUSCULAR | Status: DC | PRN

## 2020-03-01 SURGICAL SUPPLY — 22 items
CEMENT KYPHON CX01A KIT/MIXER (Cement) ×3 IMPLANT
COVER WAND RF STERILE (DRAPES) ×3 IMPLANT
DERMABOND ADVANCED (GAUZE/BANDAGES/DRESSINGS) ×2
DERMABOND ADVANCED .7 DNX12 (GAUZE/BANDAGES/DRESSINGS) ×1 IMPLANT
DEVICE BIOPSY BONE KYPH (INSTRUMENTS) ×2 IMPLANT
DEVICE BIOPSY BONE KYPHX (INSTRUMENTS) ×1 IMPLANT
DRAPE C-ARM XRAY 36X54 (DRAPES) ×3 IMPLANT
DURAPREP 26ML APPLICATOR (WOUND CARE) ×3 IMPLANT
FEE RENTAL RFA GENERATOR (MISCELLANEOUS) IMPLANT
GLOVE SURG SYN 9.0  PF PI (GLOVE) ×2
GLOVE SURG SYN 9.0 PF PI (GLOVE) ×1 IMPLANT
GOWN SRG 2XL LVL 4 RGLN SLV (GOWNS) ×1 IMPLANT
GOWN STRL NON-REIN 2XL LVL4 (GOWNS) ×2
GOWN STRL REUS W/ TWL LRG LVL3 (GOWN DISPOSABLE) ×1 IMPLANT
GOWN STRL REUS W/TWL LRG LVL3 (GOWN DISPOSABLE) ×2
PACK KYPHOPLASTY (MISCELLANEOUS) ×3 IMPLANT
RENTAL RFA  GENERATOR (MISCELLANEOUS)
RENTAL RFA GENERATOR (MISCELLANEOUS) IMPLANT
STRAP SAFETY 5IN WIDE (MISCELLANEOUS) ×3 IMPLANT
TRAY KYPHOPAK 15/2 EXPRESS (KITS) ×2 IMPLANT
TRAY KYPHOPAK 15/3 EXPRESS 1ST (MISCELLANEOUS) ×1 IMPLANT
TRAY KYPHOPAK 20/3 EXPRESS 1ST (MISCELLANEOUS) ×1 IMPLANT

## 2020-03-01 NOTE — Op Note (Signed)
Date March 01, 2020  time 12:54 PM   PATIENT:  Kristi [name]   PRE-OPERATIVE DIAGNOSIS:  closed wedge compression fracture of T7   POST-OPERATIVE DIAGNOSIS:  closed wedge compression fracture of T7   PROCEDURE:  Procedure(s): KYPHOPLASTY T7  SURGEON: Laurene Footman, MD   ASSISTANTS: None   ANESTHESIA:   local and MAC   EBL:  No intake/output data recorded.   BLOOD ADMINISTERED:none   DRAINS: none    LOCAL MEDICATIONS USED:  MARCAINE    and XYLOCAINE    SPECIMEN:   T7 vertebral body biopsy   DISPOSITION OF SPECIMEN:  Pathology   COUNTS:  YES   TOURNIQUET:  * No tourniquets in log *   IMPLANTS: Bone cement   DICTATION: .Dragon Dictation  patient was brought to the operating room and after adequate anesthesia was obtained the patient was placed prone.  C arm was brought in in good visualization of the affected level obtained on both AP and lateral projections.  After patient identification and timeout procedures were completed, local anesthetic was infiltrated with 10 cc 1% Xylocaine infiltrated subcutaneously.  This is done the area on the each side of the planned approach.  The back was then prepped and draped in the usual sterile manner and repeat timeout procedure carried out.  A spinal needle was brought down to the pedicle on the each side of  T7 and a 50-50 mix of 1% Xylocaine half percent Sensorcaine with epinephrine total of 20 cc injected.  After allowing this to set a small incision was made and the trocar was advanced into the vertebral body in an extrapedicular fashion.  Biopsy was obtained Drilling was carried out balloon was inflated to 0.2 cc on each side When the cement was appropriate consistency 0.5 cc on each side was injected into the vertebral body with some extravasation into the T6-7 and T7-T8 disc space. After the cement had set the trochar was removed and permanent C-arm views obtained.  The wound was closed with Dermabond followed by Band-Aid   PLAN OF  CARE: Discharge to home after PACU   PATIENT DISPOSITION:  PACU - hemodynamically stable.

## 2020-03-01 NOTE — Progress Notes (Signed)
Patient blood pressure low. Tried to open up fluids but would not run. Tried to flush IV without success. IV site was bruised and swollen. Removed IV and restarted in the left forearm.

## 2020-03-01 NOTE — Transfer of Care (Signed)
Immediate Anesthesia Transfer of Care Note  Patient: Kristi [name] [facility]  Procedure(s) Performed: T7 KYPHOPLASTY (N/A Back)  Patient Location: PACU  Anesthesia Type:MAC  Level of Consciousness: awake, alert , oriented and patient cooperative  Airway & Oxygen Therapy: Patient Spontanous Breathing  Post-op Assessment: Report given to RN and Post -op Vital signs reviewed and stable  Post vital signs: Reviewed and stable  Last Vitals:  Vitals Value Taken Time  BP 113/84 03/01/20 1308  Temp 36.8 C 03/01/20 1257  Pulse 85 03/01/20 1310  Resp 15 03/01/20 1310  SpO2 96 % 03/01/20 1310  Vitals shown include unvalidated device data.  Last Pain:  Vitals:   03/01/20 1257  TempSrc:   PainSc: 0-No pain         Complications: No apparent anesthesia complications

## 2020-03-01 NOTE — Discharge Instructions (Addendum)
Take it easy today and tomorrow. Remove Band-Aids Sunday then okay to shower Try not to lift over 5 pounds for 2 weeks to prevent refracture. Call office if pain starts increasing.    AMBULATORY SURGERY  DISCHARGE INSTRUCTIONS   1) The drugs that you were given will stay in your system until tomorrow so for the next 24 hours you should not:  A) Drive an automobile B) Make any legal decisions C) Drink any alcoholic beverage   2) You may resume regular meals tomorrow.  Today it is better to start with liquids and gradually work up to solid foods.  You may eat anything you prefer, but it is better to start with liquids, then soup and crackers, and gradually work up to solid foods.   3) Please notify your doctor immediately if you have any unusual bleeding, trouble breathing, redness and pain at the surgery site, drainage, fever, or pain not relieved by medication.    4) Additional Instructions:        Please contact your physician with any problems or Same Day Surgery at [phone], Monday through Friday 6 am to 4 pm, or [place] at [facility] number at [phone].

## 2020-03-01 NOTE — H&P (Signed)
Reviewed paper H+P, will be scanned into chart. No changes noted.  

## 2020-03-01 NOTE — Anesthesia Preprocedure Evaluation (Signed)
Anesthesia Evaluation  Patient identified by MRN, date of birth, ID band Patient awake    Reviewed: Allergy & Precautions, NPO status , Patient's Chart, lab work & pertinent test results  History of Anesthesia Complications Negative for: history of anesthetic complications  Airway Mallampati: II  TM Distance: >3 FB Neck ROM: Full    Dental  (+) Upper Dentures, Lower Dentures   Pulmonary neg sleep apnea, COPD,  COPD inhaler, former smoker,    breath sounds clear to auscultation- rhonchi (-) wheezing      Cardiovascular hypertension, + angina + CAD, + Past MI and + Cardiac Stents (2015)   Rhythm:Regular Rate:Normal - Systolic murmurs and - Diastolic murmurs    Neuro/Psych Anxiety negative neurological ROS     GI/Hepatic Neg liver ROS, GERD  ,  Endo/Other  negative endocrine ROSneg diabetes  Renal/GU      Musculoskeletal  (+) Arthritis ,   Abdominal (+) - obese,   Peds negative pediatric ROS (+)  Hematology negative hematology ROS (+)   Anesthesia Other Findings Past Medical History: 09/30/2014: Acute MI, inferoposterior wall (HCC) No date: Claustrophobia No date: Coronary artery disease No date: Hypercholesteremia No date: Hypertension 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, old 10/27/2018: 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 [facility])   Reproductive/Obstetrics                             Anesthesia Physical  Anesthesia Plan  ASA: III  Anesthesia Plan: General   Post-op Pain Management:    Induction: Intravenous  PONV Risk Score and Plan: 2 and Propofol infusion  Airway Management Planned: Natural Airway and Nasal Cannula  Additional Equipment:   Intra-op Plan:   Post-operative Plan:   Informed Consent: I have reviewed the patients History and Physical, chart, labs and discussed the procedure including the risks, benefits and alternatives for the proposed anesthesia with the patient or authorized  representative who has indicated his/her understanding and acceptance.     Dental advisory given  Plan Discussed with: CRNA and Anesthesiologist  Anesthesia Plan Comments:         Anesthesia Quick Evaluation

## 2020-03-04 ENCOUNTER — Inpatient Hospital Stay: Payer: Medicare Other | Attending: Oncology

## 2020-03-04 ENCOUNTER — Encounter: Payer: Self-pay | Admitting: Oncology

## 2020-03-04 ENCOUNTER — Inpatient Hospital Stay: Payer: Medicare Other | Admitting: Hospice and Palliative Medicine

## 2020-03-04 ENCOUNTER — Other Ambulatory Visit: Payer: Self-pay

## 2020-03-04 ENCOUNTER — Inpatient Hospital Stay: Payer: Medicare Other | Attending: Oncology | Admitting: Oncology

## 2020-03-04 VITALS — BP 119/82 | HR 84 | Temp 97.8°F | Resp 18 | Wt 145.8 lb

## 2020-03-04 DIAGNOSIS — M8580 Other specified disorders of bone density and structure, unspecified site: Secondary | ICD-10-CM | POA: Insufficient documentation

## 2020-03-04 DIAGNOSIS — F329 Major depressive disorder, single episode, unspecified: Secondary | ICD-10-CM | POA: Diagnosis not present

## 2020-03-04 DIAGNOSIS — C3490 Malignant neoplasm of unspecified part of unspecified bronchus or lung: Secondary | ICD-10-CM | POA: Diagnosis not present

## 2020-03-04 DIAGNOSIS — R413 Other amnesia: Secondary | ICD-10-CM | POA: Diagnosis not present

## 2020-03-04 DIAGNOSIS — C7951 Secondary malignant neoplasm of bone: Secondary | ICD-10-CM | POA: Insufficient documentation

## 2020-03-04 DIAGNOSIS — E860 Dehydration: Secondary | ICD-10-CM

## 2020-03-04 DIAGNOSIS — Z95828 Presence of other vascular implants and grafts: Secondary | ICD-10-CM

## 2020-03-04 DIAGNOSIS — S22000A Wedge compression fracture of unspecified thoracic vertebra, initial encounter for closed fracture: Secondary | ICD-10-CM

## 2020-03-04 DIAGNOSIS — Z5112 Encounter for antineoplastic immunotherapy: Secondary | ICD-10-CM

## 2020-03-04 DIAGNOSIS — Z87891 Personal history of nicotine dependence: Secondary | ICD-10-CM | POA: Diagnosis not present

## 2020-03-04 DIAGNOSIS — R296 Repeated falls: Secondary | ICD-10-CM | POA: Insufficient documentation

## 2020-03-04 DIAGNOSIS — E871 Hypo-osmolality and hyponatremia: Secondary | ICD-10-CM | POA: Insufficient documentation

## 2020-03-04 DIAGNOSIS — F32A Depression, unspecified: Secondary | ICD-10-CM

## 2020-03-04 LAB — COMPREHENSIVE METABOLIC PANEL
ALT: 11 U/L (ref 0–44)
AST: 15 U/L (ref 15–41)
Albumin: 4.1 g/dL (ref 3.5–5.0)
Alkaline Phosphatase: 47 U/L (ref 38–126)
Anion gap: 7 (ref 5–15)
BUN: 14 mg/dL (ref 8–23)
CO2: 22 mmol/L (ref 22–32)
Calcium: 8.8 mg/dL — ABNORMAL LOW (ref 8.9–10.3)
Chloride: 102 mmol/L (ref 98–111)
Creatinine, Ser: 0.89 mg/dL (ref 0.44–1.00)
GFR calc Af Amer: >60 mL/min (ref >60)
GFR calc non Af Amer: >60 mL/min (ref >60)
Glucose, Bld: 111 mg/dL — ABNORMAL HIGH (ref 70–99)
Potassium: 3.8 mmol/L (ref 3.5–5.1)
Sodium: 131 mmol/L — ABNORMAL LOW (ref 135–145)
Total Bilirubin: 0.8 mg/dL (ref 0.3–1.2)
Total Protein: 7 g/dL (ref 6.5–8.1)

## 2020-03-04 LAB — SURGICAL PATHOLOGY

## 2020-03-04 LAB — CBC WITH DIFFERENTIAL/PLATELET
Abs Immature Granulocytes: 0.02 10*3/uL (ref 0.00–0.07)
Basophils Absolute: 0 10*3/uL (ref 0.0–0.1)
Basophils Relative: 1 %
Eosinophils Absolute: 0.3 10*3/uL (ref 0.0–0.5)
Eosinophils Relative: 5 %
HCT: 34.5 % — ABNORMAL LOW (ref 36.0–46.0)
Hemoglobin: 11.9 g/dL — ABNORMAL LOW (ref 12.0–15.0)
Immature Granulocytes: 0 %
Lymphocytes Relative: 21 %
Lymphs Abs: 1.2 10*3/uL (ref 0.7–4.0)
MCH: 29.8 pg (ref 26.0–34.0)
MCHC: 34.5 g/dL (ref 30.0–36.0)
MCV: 86.5 fL (ref 80.0–100.0)
Monocytes Absolute: 0.9 10*3/uL (ref 0.1–1.0)
Monocytes Relative: 15 %
Neutro Abs: 3.3 10*3/uL (ref 1.7–7.7)
Neutrophils Relative %: 58 %
Platelets: 152 10*3/uL (ref 150–400)
RBC: 3.99 MIL/uL (ref 3.87–5.11)
RDW: 13.3 % (ref 11.5–15.5)
WBC: 5.8 10*3/uL (ref 4.0–10.5)
nRBC: 0 % (ref 0.0–0.2)

## 2020-03-04 MED ORDER — HEPARIN SOD (PORK) LOCK FLUSH 100 UNIT/ML IV SOLN
INTRAVENOUS | Status: AC
  Filled 2020-03-04: qty 5

## 2020-03-04 MED ORDER — DEXAMETHASONE SODIUM PHOSPHATE 10 MG/ML IJ SOLN
10.0000 mg | Freq: Once | INTRAMUSCULAR | Status: AC
  Administered 2020-03-04: 10 mg via INTRAVENOUS
  Filled 2020-03-04: qty 1

## 2020-03-04 MED ORDER — ZOLEDRONIC ACID 4 MG/5ML IV CONC
3.5000 mg | Freq: Once | INTRAVENOUS | Status: AC
  Administered 2020-03-04: 3.5 mg via INTRAVENOUS
  Filled 2020-03-04: qty 4.38

## 2020-03-04 MED ORDER — SODIUM CHLORIDE 0.9 % IV SOLN
1200.0000 mg | Freq: Once | INTRAVENOUS | Status: AC
  Administered 2020-03-04: 1200 mg via INTRAVENOUS
  Filled 2020-03-04: qty 20

## 2020-03-04 MED ORDER — SODIUM CHLORIDE 0.9% FLUSH
10.0000 mL | Freq: Once | INTRAVENOUS | Status: AC
  Administered 2020-03-04: 10 mL via INTRAVENOUS
  Filled 2020-03-04: qty 10

## 2020-03-04 MED ORDER — HEPARIN SOD (PORK) LOCK FLUSH 100 UNIT/ML IV SOLN
500.0000 [IU] | Freq: Once | INTRAVENOUS | Status: AC
  Administered 2020-03-04: 500 [IU] via INTRAVENOUS
  Filled 2020-03-04: qty 5

## 2020-03-04 MED ORDER — SODIUM CHLORIDE 0.9 % IV SOLN
Freq: Once | INTRAVENOUS | Status: AC
  Filled 2020-03-04: qty 250

## 2020-03-04 NOTE — Progress Notes (Signed)
Hematology/Oncology Follow up note [facility] [phone] [phone]   Patient Care Team: Glean Hess, MD as PCP - General (Internal Medicine) Charolette Forward, MD as Consulting Physician (Cardiology) Telford Nab, RN as Registered Nurse  REFERRING PROVIDER: Dr. Felicie Morn REASON FOR VISIT:  Follow-up for small cell lung cancer,   HISTORY OF PRESENTING ILLNESS:  Kristi [name] is a  74 y.o.  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  10/20/2018 CT chest with contrast showed large mediastinal mass involving both hilar, left greater than right, consistent with lung carcinoma, The mass causes narrowing of the left mainstem bronchus with resultant volume loss on the left and a mediastinal shift to the left.  Moderate size left pleural effusion. 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  # MRI brain negative.  # Nov 2019- Jan 2020 s/p 4 cycles of Carbo/Etoposide/Tecentriq #  01/24/2019 interim CT scan done which showed continued positive response to therapy with continued reduction in mediastinal adenopathy.  No residual measurable left lung mass. CT findings of acute emphysematous cystitis. Urology did not feel that patient has pyelonephritis and recommend treatment with antibiotics because patient's immunocompromise. Patient finished treatment.   # Chest and whole brain radiation finished in May 2020 # 04/25/2019 resume Tecentriq every 3 weeks. # Patient had a fall from her stairs on 01/19/2020. In the emergency room she had CT chest abdomen pelvis CT, CT head without contrast, CT cervical spine without contrast. No CT evidence for acute intracranial abnormality, degenerative changes of cervical spine..  No CT evidence of acute thoracic abdomen injury.  Severe compression fracture of T7,  subacute.   INTERVAL HISTORY Kristi [name] is a 74 y.o. female who has above history reviewed by me today presents for evaluation prior to immunotherapy for treatment of extensive small cell lung cancer. Patient is on immunotherapy maintenance. Patient has had kyphoplasty procedure on 03/01/2020.  Today she reports pain is slightly better.  She continues to use fentanyl patch, she takes Norco less often compared to prior to the procedure.  She rates her pain today 3 out of 10.  Otherwise no new complaints.  Review of Systems  Constitutional: Positive for fatigue. Negative for appetite change, chills and fever.  HENT:   Negative for hearing loss and voice change.   Eyes: Negative for eye problems.  Respiratory: Negative for chest tightness, cough and shortness of breath.   Cardiovascular: Negative for chest pain.  Gastrointestinal: Negative for abdominal distention, abdominal pain, blood in stool, diarrhea and nausea.  Endocrine: Negative for hot flashes.  Genitourinary: Negative for difficulty urinating and frequency.   Musculoskeletal: Positive for back pain. Negative for arthralgias.  Skin: Negative for itching and rash.  Neurological: Negative for extremity weakness.  Hematological: Negative for adenopathy.  Psychiatric/Behavioral: Negative for confusion and depression. The patient is not nervous/anxious.        Forgetful     MEDICAL HISTORY:  Past Medical History:  Diagnosis Date  . Acute MI, inferoposterior wall ([place]) 09/30/2014  . Claustrophobia   . Coronary artery disease   . GERD (gastroesophageal reflux disease)   . Hypercholesteremia   . Hypertension   . MI, old   . Pneumonia   . 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 [facility]) 10/27/2018    SURGICAL HISTORY: Past Surgical History:  Procedure Laterality Date  . APPENDECTOMY     benign  tumor on liver found  . BLADDER NECK SUSPENSION    . CHOLECYSTECTOMY N/A 08/14/2018   Procedure: LAPAROSCOPIC CHOLECYSTECTOMY;  Surgeon:  Jules Husbands, MD;  Location: [facility] ORS;  Service: General;  Laterality: N/A;  . CHOLECYSTECTOMY  11/2018  . CORONARY ANGIOPLASTY  09/29/2014  . CORONARY STENT PLACEMENT    . ENDOBRONCHIAL ULTRASOUND N/A 10/21/2018   Procedure: ENDOBRONCHIAL ULTRASOUND;  Surgeon: Laverle Hobby, MD;  Location: [facility] ORS;  Service: Pulmonary;  Laterality: N/A;  . HERNIA REPAIR    . IR FLUORO GUIDE CV LINE RIGHT  12/19/2018  . KYPHOPLASTY N/A 03/01/2020   Procedure: T7 KYPHOPLASTY;  Surgeon: Hessie Knows, MD;  Location: [facility] ORS;  Service: Orthopedics;  Laterality: N/A;  . LEFT HEART CATHETERIZATION WITH CORONARY ANGIOGRAM N/A 09/29/2014   Procedure: LEFT HEART CATHETERIZATION WITH CORONARY ANGIOGRAM;  Surgeon: Clent Demark, MD;  Location: [place] CATH LAB;  Service: Cardiovascular;  Laterality: N/A;  . PORTA CATH INSERTION N/A 01/02/2019   Procedure: PORTA CATH INSERTION;  Surgeon: Algernon Huxley, MD;  Location: [place] CV LAB;  Service: Cardiovascular;  Laterality: N/A;  . PORTACATH PLACEMENT Right 10/28/2018   Procedure: INSERTION PORT-A-CATH;  Surgeon: Jules Husbands, MD;  Location: [facility] ORS;  Service: General;  Laterality: Right;    SOCIAL HISTORY: Social History   Socioeconomic History  . Marital status: Married    Spouse name: [name]   . Number of children: 3  . Years of education: Not on file  . Highest education level: Not on file  Occupational History  . Occupation: Retired    Comment: Chief Technology Officer   Tobacco Use  . Smoking status: Former Smoker    Packs/day: 1.00    Years: 39.00    Pack years: 39.00    Types: Cigarettes    Start date: 12/21/1978    Quit date: 10/16/2018    Years since quitting: 1.3  . Smokeless tobacco: Never Used  Substance and Sexual Activity  . Alcohol use: Yes    Alcohol/week: 0.0 standard drinks    Comment: occassional - approx 1 every 2 weeks   . Drug use: No  . Sexual activity: Yes    Birth control/protection: None  Other Topics Concern  . Not on file   Social History Narrative  . Not on file   Social Determinants of Health   Financial Resource Strain:   . Difficulty of Paying Living Expenses:   Food Insecurity:   . Worried About Charity fundraiser in the Last Year:   . Arboriculturist in the Last Year:   Transportation Needs:   . Film/video editor (Medical):   [name] Kitchen Lack of Transportation (Non-Medical):   Physical Activity:   . Days of Exercise per Week:   . Minutes of Exercise per Session:   Stress:   . Feeling of Stress :   Social Connections:   . Frequency of Communication with Friends and Family:   . Frequency of Social Gatherings with Friends and Family:   . Attends Religious Services:   . Active Member of Clubs or Organizations:   . Attends Archivist Meetings:   [name] Kitchen Marital Status:   Intimate Partner Violence:   . Fear of Current or Ex-Partner:   . Emotionally Abused:   [name] Kitchen Physically Abused:   . Sexually Abused:     FAMILY HISTORY: Family History  Problem Relation Age of Onset  . Alzheimer's disease Mother   . Colon cancer Father   . Breast  cancer Neg Hx     ALLERGIES:  has No Known Allergies.  MEDICATIONS:  Current Outpatient Medications  Medication Sig Dispense Refill  . acetaminophen (TYLENOL) 500 MG tablet Take 500 mg by mouth 2 (two) times daily as needed for moderate pain or headache.    Huey Bienenstock (TECENTRIQ IV) Inject 1 Dose into the vein every 30 (thirty) days.    . Cholecalciferol (DIALYVITE VITAMIN D 5000) 125 MCG (5000 UT) capsule Take 5,000 Units by mouth daily.    . diphenhydrAMINE-zinc acetate (BENADRYL EXTRA STRENGTH) cream Apply 1 application topically 3 (three) times daily as needed for itching. 28.4 g 0  . fentaNYL (DURAGESIC) 25 MCG/HR Place 1 patch onto the skin every 3 (three) days. 10 patch 0  . HYDROcodone-acetaminophen (NORCO/VICODIN) 5-325 MG tablet Take 1 tablet by mouth every 6 (six) hours as needed for moderate pain. 90 tablet 0  . lidocaine-prilocaine (EMLA)  cream Apply to affected area once (Patient taking differently: Apply 1 application topically daily as needed (port access). ) 30 g 3  . loperamide (IMODIUM A-D) 2 MG tablet Take 2 mg by mouth 4 (four) times daily as needed for diarrhea or loose stools.    [name] Kitchen omeprazole (PRILOSEC) 40 MG capsule TAKE ONE CAPSULE BY MOUTH DAILY (Patient taking differently: Take 40 mg by mouth daily as needed (acid reflux). ) 90 capsule 1  . polyethylene glycol (MIRALAX / GLYCOLAX) packet Take 17 g by mouth daily as needed for mild constipation.     . sertraline (ZOLOFT) 50 MG tablet Take 1.5 tablets (75 mg total) by mouth daily. 45 tablet 1  . vitamin B-12 (CYANOCOBALAMIN) 1000 MCG tablet Take 1,000 mcg by mouth daily.    . Zoledronic Acid (ZOMETA IV) Inject 1 Dose into the vein every 30 (thirty) days.    . nitroGLYCERIN (NITROSTAT) 0.4 MG SL tablet Place 1 tablet (0.4 mg total) under the tongue every 5 (five) minutes x 3 doses as needed for chest pain. (Patient not taking: Reported on 03/04/2020) 25 tablet 12   No current facility-administered medications for this visit.   Facility-Administered Medications Ordered in Other Visits  Medication Dose Route Frequency Provider Last Rate Last Admin  . heparin lock flush 100 unit/mL  500 Units Intravenous Once Earlie Server, MD      . heparin lock flush 100 unit/mL  500 Units Intravenous Once Earlie Server, MD      . heparin lock flush 100 unit/mL  500 Units Intravenous Once Earlie Server, MD      . sodium chloride flush (NS) 0.9 % injection 10 mL  10 mL Intravenous PRN Earlie Server, MD   10 mL at 01/09/19 0820  . sodium chloride flush (NS) 0.9 % injection 10 mL  10 mL Intravenous PRN Earlie Server, MD   10 mL at 01/10/19 1400     PHYSICAL EXAMINATION: ECOG PERFORMANCE STATUS: 1 - Symptomatic but completely ambulatory Vitals:   03/04/20 0845  BP: 119/82  Pulse: 84  Resp: 18  Temp: 97.8 F (36.6 C)  SpO2: 96%   Filed Weights   03/04/20 0845  Weight: 145 lb 12.8 oz (66.1 kg)    Physical Exam  Constitutional: She is oriented to person, place, and time. No distress.  HENT:  Head: Normocephalic and atraumatic.  Nose: Nose normal.  Mouth/Throat: Oropharynx is clear and moist. No oropharyngeal exudate.  Eyes: Pupils are equal, round, and reactive to light. EOM are normal. No scleral icterus.  Cardiovascular: Normal rate and regular rhythm.  No murmur heard. Pulmonary/Chest: Effort normal. No respiratory distress. She has no rales. She exhibits no tenderness.  Decreased breath sounds bilaterally, right side breath sound is more diminished.   Abdominal: Soft. She exhibits no distension. There is no abdominal tenderness.  Musculoskeletal:        General: No edema. Normal range of motion.     Cervical back: Normal range of motion and neck supple.  Neurological: She is alert and oriented to person, place, and time. No cranial nerve deficit. She exhibits normal muscle tone. Coordination normal.  Skin: Skin is warm and dry. She is not diaphoretic. No erythema.  Psychiatric: Affect normal.       LABORATORY DATA:  I have reviewed the data as listed Lab Results  Component Value Date   WBC 5.8 03/04/2020   HGB 11.9 (L) 03/04/2020   HCT 34.5 (L) 03/04/2020   MCV 86.5 03/04/2020   PLT 152 03/04/2020   Recent Labs    01/01/20 0856 01/01/20 0856 01/19/20 1542 01/22/20 0850 02/12/20 0921  NA 138   < > 137 135 138  K 3.9   < > 3.7 3.7 3.7  CL 103   < > 104 102 105  CO2 25   < > 24 22 24   GLUCOSE 107*   < > 103* 106* 104*  BUN 18   < > 11 14 16   CREATININE 0.75   < > 0.70 0.87 0.76  CALCIUM 9.6   < > 9.3 9.4 9.1  GFRNONAA >60   < > >60 >60 >60  GFRAA >60   < > >60 >60 >60  PROT 7.3  --   --  7.3 7.1  ALBUMIN 4.1  --   --  4.1 4.0  AST 15  --   --  15 13*  ALT 12  --   --  12 10  ALKPHOS 63  --   --  62 55  BILITOT 0.7  --   --  0.6 0.5   < > = values in this interval not displayed.    RADIOGRAPHIC STUDIES: I have personally reviewed the radiological  images as listed and agreed with the findings in the report.  DG Thoracic Spine 2 View  Result Date: 03/01/2020 CLINICAL DATA:  T7 kyphoplasty. EXAM: THORACIC SPINE 2 VIEWS; DG C-ARM 1-60 MIN COMPARISON:  Chest CT 01/19/2020 FINDINGS: Frontal and lateral fluoroscopic spot views of the thoracic spine obtained in the operating room. There is kyphoplasty material about midthoracic vertebra, level not delineated due to coned field of view. Reported fluoroscopy time 2 minutes 40 seconds. IMPRESSION: Fluoroscopic spot views after thoracic kyphoplasty. Electronically Signed   By: Keith Rake M.D.   On: 03/01/2020 14:06   DG Wrist Complete Left  Result Date: 01/19/2020 CLINICAL DATA:  [place] down 5 stairs 2 days ago with left wrist pain. EXAM: LEFT WRIST - COMPLETE 3+ VIEW COMPARISON:  None. FINDINGS: Mild degenerate change over the radiocarpal joint and first carpometacarpal joints. Mild osteopenia is present. No evidence of acute fracture or dislocation. IMPRESSION: No acute fracture. Electronically Signed   By: Marin Olp M.D.   On: 01/19/2020 15:04   CT Head Wo Contrast  Result Date: 01/19/2020 CLINICAL DATA:  [place] down stairs bruising to the left forehead puncture wound EXAM: CT HEAD WITHOUT CONTRAST CT CERVICAL SPINE WITHOUT CONTRAST TECHNIQUE: Multidetector CT imaging of the head and cervical spine was performed following the standard protocol without intravenous contrast. Multiplanar CT image reconstructions of the cervical  spine were also generated. COMPARISON:  MRI 12/01/2019, CT brain 07/27/2016 FINDINGS: CT HEAD FINDINGS Brain: No acute territorial infarction, hemorrhage or intracranial mass is visualized. Extensive white matter hypodensity throughout the brain, felt consistent with history of whole brain radiation. Mild atrophy. Stable ventricle size Vascular: No hyperdense vessels.  Carotid vascular calcification Skull: Normal. Negative for fracture or focal lesion. Sinuses/Orbits: No acute  finding. Other: Small forehead scalp hematoma CT CERVICAL SPINE FINDINGS Alignment: No subluxation.  Facet alignment is normal. Skull base and vertebrae: No acute fracture. No primary bone lesion or focal pathologic process. Soft tissues and spinal canal: No prevertebral fluid or swelling. No visible canal hematoma. Disc levels: Advanced degenerative changes C4-C5, C5-C6 and C6-C7. Multiple level facet degenerative change. Upper chest: Emphysema Other: None IMPRESSION: 1. No CT evidence for acute intracranial abnormality. 2. Extensive white matter disease, felt consistent with history of whole brain radiation. 3. Degenerative changes of the cervical spine. No acute osseous abnormality 4. Emphysema Electronically Signed   By: Donavan Foil M.D.   On: 01/19/2020 18:25   CT Chest W Contrast  Result Date: 01/19/2020 CLINICAL DATA:  [place] down concrete steps 2 days ago. Blunt chest and abdominal trauma, with pain and tenderness. Initial encounter. EXAM: CT CHEST, ABDOMEN, AND PELVIS WITH CONTRAST TECHNIQUE: Multidetector CT imaging of the chest, abdomen and pelvis was performed following the standard protocol during bolus administration of intravenous contrast. CONTRAST:  181mL OMNIPAQUE IOHEXOL 300 MG/ML  SOLN COMPARISON:  10/23/2019 FINDINGS: CT CHEST FINDINGS Cardiovascular: No evidence of thoracic aortic injury or mediastinal hematoma. No pericardial effusion. Aortic and coronary artery atherosclerosis incidentally noted. Mediastinum/Nodes: No evidence of pneumomediastinum. No masses or pathologically enlarged lymph nodes identified. Lungs/Pleura: No evidence of pulmonary contusion or other infiltrate. Bibasilar atelectasis or scarring noted. No evidence of pneumothorax or hemothorax. Musculoskeletal: A severe compression fracture of the T7 vertebral body is seen. This was not seen on previous study approximately 3 months ago, but shows sclerosis indicating that it is subacute in age. CT ABDOMEN PELVIS FINDINGS  Hepatobiliary: No hepatic laceration or mass identified. Prior cholecystectomy. No evidence of biliary obstruction. Pancreas: No parenchymal laceration, mass, or inflammatory changes identified. Spleen: No evidence of splenic laceration. Adrenal/Urinary Tract: No hemorrhage or parenchymal lacerations identified. No evidence of mass or hydronephrosis. Stomach/Bowel: No evidence of bowel wall thickening or dilatation. No evidence of hemoperitoneum. Diverticulosis is seen mainly involving the sigmoid colon, however there is no evidence of diverticulitis. A moderate ventral abdominal wall hernia is again seen containing the multiple small bowel loops. No evidence of small bowel obstruction or strangulation. Vascular/Lymphatic: No evidence of abdominal aortic injury or retroperitoneal hemorrhage. Aortic atherosclerosis incidentally noted. No pathologically enlarged lymph nodes identified. Reproductive:  No mass or other significant abnormality identified. Other:  None. Musculoskeletal: No acute fractures or suspicious bone lesions identified. IMPRESSION: 1. Severe compression fracture of T7 vertebral body, likely subacute in age. 2. No evidence of thoracic aortic injury or mediastinal hematoma. 3. No evidence of abdominal organ injury or hemoperitoneum. 4. Moderate ventral abdominal wall hernia containing multiple small bowel loops. 5. Colonic diverticulosis, without radiographic evidence of diverticulitis. 6. Aortic and coronary artery atherosclerosis. Electronically Signed   By: Marlaine Hind M.D.   On: 01/19/2020 18:31   CT Cervical Spine Wo Contrast  Result Date: 01/19/2020 CLINICAL DATA:  [place] [address] down stairs bruising to the left forehead puncture wound EXAM: CT HEAD WITHOUT CONTRAST CT CERVICAL SPINE WITHOUT CONTRAST TECHNIQUE: Multidetector CT imaging of the head and cervical spine was  performed following the standard protocol without intravenous contrast. Multiplanar CT image reconstructions of the cervical spine  were also generated. COMPARISON:  MRI 12/01/2019, CT brain 07/27/2016 FINDINGS: CT HEAD FINDINGS Brain: No acute territorial infarction, hemorrhage or intracranial mass is visualized. Extensive white matter hypodensity throughout the brain, felt consistent with history of whole brain radiation. Mild atrophy. Stable ventricle size Vascular: No hyperdense vessels.  Carotid vascular calcification Skull: Normal. Negative for fracture or focal lesion. Sinuses/Orbits: No acute finding. Other: Small forehead scalp hematoma CT CERVICAL SPINE FINDINGS Alignment: No subluxation.  Facet alignment is normal. Skull base and vertebrae: No acute fracture. No primary bone lesion or focal pathologic process. Soft tissues and spinal canal: No prevertebral fluid or swelling. No visible canal hematoma. Disc levels: Advanced degenerative changes C4-C5, C5-C6 and C6-C7. Multiple level facet degenerative change. Upper chest: Emphysema Other: None IMPRESSION: 1. No CT evidence for acute intracranial abnormality. 2. Extensive white matter disease, felt consistent with history of whole brain radiation. 3. Degenerative changes of the cervical spine. No acute osseous abnormality 4. Emphysema Electronically Signed   By: Donavan Foil M.D.   On: 01/19/2020 18:25   CT Abdomen Pelvis W Contrast  Result Date: 01/19/2020 CLINICAL DATA:  [place] [address] down concrete steps 2 days ago. Blunt chest and abdominal trauma, with pain and tenderness. Initial encounter. EXAM: CT CHEST, ABDOMEN, AND PELVIS WITH CONTRAST TECHNIQUE: Multidetector CT imaging of the chest, abdomen and pelvis was performed following the standard protocol during bolus administration of intravenous contrast. CONTRAST:  120mL OMNIPAQUE IOHEXOL 300 MG/ML  SOLN COMPARISON:  10/23/2019 FINDINGS: CT CHEST FINDINGS Cardiovascular: No evidence of thoracic aortic injury or mediastinal hematoma. No pericardial effusion. Aortic and coronary artery atherosclerosis incidentally noted.  Mediastinum/Nodes: No evidence of pneumomediastinum. No masses or pathologically enlarged lymph nodes identified. Lungs/Pleura: No evidence of pulmonary contusion or other infiltrate. Bibasilar atelectasis or scarring noted. No evidence of pneumothorax or hemothorax. Musculoskeletal: A severe compression fracture of the T7 vertebral body is seen. This was not seen on previous study approximately 3 months ago, but shows sclerosis indicating that it is subacute in age. CT ABDOMEN PELVIS FINDINGS Hepatobiliary: No hepatic laceration or mass identified. Prior cholecystectomy. No evidence of biliary obstruction. Pancreas: No parenchymal laceration, mass, or inflammatory changes identified. Spleen: No evidence of splenic laceration. Adrenal/Urinary Tract: No hemorrhage or parenchymal lacerations identified. No evidence of mass or hydronephrosis. Stomach/Bowel: No evidence of bowel wall thickening or dilatation. No evidence of hemoperitoneum. Diverticulosis is seen mainly involving the sigmoid colon, however there is no evidence of diverticulitis. A moderate ventral abdominal wall hernia is again seen containing the multiple small bowel loops. No evidence of small bowel obstruction or strangulation. Vascular/Lymphatic: No evidence of abdominal aortic injury or retroperitoneal hemorrhage. Aortic atherosclerosis incidentally noted. No pathologically enlarged lymph nodes identified. Reproductive:  No mass or other significant abnormality identified. Other:  None. Musculoskeletal: No acute fractures or suspicious bone lesions identified. IMPRESSION: 1. Severe compression fracture of T7 vertebral body, likely subacute in age. 2. No evidence of thoracic aortic injury or mediastinal hematoma. 3. No evidence of abdominal organ injury or hemoperitoneum. 4. Moderate ventral abdominal wall hernia containing multiple small bowel loops. 5. Colonic diverticulosis, without radiographic evidence of diverticulitis. 6. Aortic and coronary  artery atherosclerosis. Electronically Signed   By: Marlaine Hind M.D.   On: 01/19/2020 18:31   DG C-Arm 1-60 Min  Result Date: 03/01/2020 CLINICAL DATA:  T7 kyphoplasty. EXAM: THORACIC SPINE 2 VIEWS; DG C-ARM 1-60 MIN COMPARISON:  Chest CT  01/19/2020 FINDINGS: Frontal and lateral fluoroscopic spot views of the thoracic spine obtained in the operating room. There is kyphoplasty material about midthoracic vertebra, level not delineated due to coned field of view. Reported fluoroscopy time 2 minutes 40 seconds. IMPRESSION: Fluoroscopic spot views after thoracic kyphoplasty. Electronically Signed   By: Keith Rake M.D.   On: 03/01/2020 14:06     ASSESSMENT & PLAN:  1. Small cell lung cancer ([place])   2. Encounter for antineoplastic immunotherapy   3. Thoracic compression fracture, closed, initial encounter ([place])   4. Memory loss   5. Depression, unspecified depression type   6. Hyponatremia    #Extensive small cell lung cancer S/p 4 cycles of Carboplatin, Etoposide and Tecentriq.   Status post chest and whole brain radiation.  Labs reviewed and discussed with patient. Counts acceptable to proceed with Tecentriq maintenance today . #Hyponatremia, sodium level 131.  Patient admits not drinking enough fluid.  Advised patient to increase oral hydration. Repeat sodium level at the next visit.  Watch for disease recurrence. She is due for repeat scan in April  #T7 compression fracture without evidence of underlying pathological osseous lesions. Status post kyphoplasty. Continue fentanyl patch 25 MCG per hour every 72 hours. She has Norco as needed.  Wean down pain medication..  #Osteopenia continue calcium and vitamin D supplementation. Bone metastasis.  Proceed with Zometa today. 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 #Frequent falls,  continue physical therapy Continue Zoloft for depression. discussed with [employer].  Patient will continue follow-up with palliative care  service. Follow up in 3 weeks for eval with next cycle of tecentriq    Earlie Server, MD, PhD

## 2020-03-04 NOTE — Anesthesia Postprocedure Evaluation (Signed)
Anesthesia Post Note  Patient: Kristi [name] [facility]  Procedure(s) Performed: T7 KYPHOPLASTY (N/A Back)  Patient location during evaluation: PACU Anesthesia Type: MAC Level of consciousness: awake and alert and oriented Pain management: pain level controlled Vital Signs Assessment: post-procedure vital signs reviewed and stable Respiratory status: spontaneous breathing Cardiovascular status: blood pressure returned to baseline Anesthetic complications: no     Last Vitals:  Vitals:   03/01/20 1356 03/01/20 1411  BP: 105/67 110/72  Pulse: 92 93  Resp: 16   Temp: 36.8 C   SpO2: 96% 99%    Last Pain:  Vitals:   03/04/20 0809  TempSrc:   PainSc: 5                  Titianna Loomis

## 2020-03-04 NOTE — Progress Notes (Signed)
Patient is here today for follow up and treatment, she is doing well no major complaints.

## 2020-03-06 ENCOUNTER — Telehealth: Payer: Self-pay

## 2020-03-06 NOTE — Telephone Encounter (Signed)
RN called patient to schedule palliative care visit.  Patient didn't answer phone.  RN left message requesting call back to schedule palliative care visit.

## 2020-03-08 ENCOUNTER — Telehealth: Payer: Self-pay

## 2020-03-08 NOTE — Telephone Encounter (Signed)
Telephone call to schedule palliative care visit.  Patient reports today is not a good day for a visit or telephonic visit.  Patient reports she and husband are in process of moving.  Patient in agreement with telephonic visit 03/18/20 at 1:00.

## 2020-03-13 ENCOUNTER — Other Ambulatory Visit: Payer: Self-pay | Admitting: *Deleted

## 2020-03-13 DIAGNOSIS — F329 Major depressive disorder, single episode, unspecified: Secondary | ICD-10-CM

## 2020-03-13 DIAGNOSIS — F32A Depression, unspecified: Secondary | ICD-10-CM

## 2020-03-13 MED ORDER — SERTRALINE HCL 50 MG PO TABS: 75.0000 mg | ORAL_TABLET | Freq: Every day | ORAL | 1 refills | Status: DC

## 2020-03-15 ENCOUNTER — Other Ambulatory Visit: Payer: Self-pay | Admitting: *Deleted

## 2020-03-15 NOTE — Progress Notes (Signed)
Pharmacist Chemotherapy Monitoring - Follow Up Assessment    I verify that I have reviewed each item in the below checklist:  . Regimen for the patient is scheduled for the appropriate day and plan matches scheduled date. [name] Kitchen Appropriate non-routine labs are ordered dependent on drug ordered. . If applicable, additional medications reviewed and ordered per protocol based on lifetime cumulative doses and/or treatment regimen.   Plan for follow-up and/or issues identified: No . I-vent associated with next due treatment: No . MD and/or nursing notified: No  Kristi [name] K 03/15/2020 8:59 AM

## 2020-03-18 ENCOUNTER — Other Ambulatory Visit: Payer: Medicare Other

## 2020-03-18 ENCOUNTER — Other Ambulatory Visit: Payer: Self-pay

## 2020-03-18 DIAGNOSIS — Z515 Encounter for palliative care: Secondary | ICD-10-CM

## 2020-03-18 NOTE — Progress Notes (Signed)
COMMUNITY PALLIATIVE CARE SW NOTE  PATIENT NAME: Kristi [name] DOB: 23-Jun-1946 MRN: [id number]  PRIMARY CARE PROVIDER: Glean Hess, MD  RESPONSIBLE PARTY:  Acct ID - Guarantor Home Phone Work Phone Relationship Acct Type  [id number] - Black,[name] [id number]  Self P/F     [address], [place], [place] [id number]     PLAN OF CARE and INTERVENTIONS:             1. GOALS OF CARE/ ADVANCE CARE PLANNING:  Ongoing discussion. Patient did not want to talk about advance care planning today. Goal is to remain at home and spend more time with family.  2. SOCIAL/EMOTIONAL/SPIRITUAL ASSESSMENT/ INTERVENTIONS:  SW completed TELEHEALTH visit with patient. Patient reports feeling "not so good". Patient said she has good days and bad days. Patient said her appetite is fair. Patient is sleeping well overall. Patient continues to experience fatigue and weakness, with SOB on exertion. Patient said that her husband helps manage the household. Patient and patient's husband have moved into an apartment on the bottom floor of the apartments and said this has helped them. Patient reports that her son and granddaughter are coming from [place] later today and she is looking forward to spending time with them. SW provided emotional support, discussed goals and reviewed care, and used active and reflective listening. 3. PATIENT/CAREGIVER EDUCATION/ COPING:  Patient was alert, engaged and open in expression of her feelings. Patient said that she has felt more depressed the past few days. Patient relates feeling "down" to her son not visiting when he was originally going to. Patient expressed her concerns for not having more time with family and stated "I never know when it could be my last day". Denies SI/HI. Patient said she is realistic and wants to enjoy more time with loved ones. SW validated these feelings and encouraged patient to talk with her family. Overall, patient said her family is supportive. 4. PERSONAL  EMERGENCY PLAN:  Family will call 9-1-1 for emergencies. 5. COMMUNITY RESOURCES COORDINATION/ HEALTH CARE NAVIGATION:  Patient's husband helps navigate care. Patient is scheduled for labs, visit with MD and infusion on 4/5. Vonna Kotyk [employer] will also see patient on 4/5.  6. FINANCIAL/LEGAL CONCERNS/INTERVENTIONS:  Discussed, patient denies any concerns.      SOCIAL HX:  Social History   Tobacco Use  . Smoking status: Former Smoker    Packs/day: 1.00    Years: 39.00    Pack years: 39.00    Types: Cigarettes    Start date: 12/21/1978    Quit date: 10/16/2018    Years since quitting: 1.4  . Smokeless tobacco: Never Used  Substance Use Topics  . Alcohol use: Yes    Alcohol/week: 0.0 standard drinks    Comment: occassional - approx 1 every 2 weeks     CODE STATUS:   Code Status: Prior ADVANCED DIRECTIVES: N MOST FORM COMPLETE:  Needs further discussion. HOSPICE EDUCATION PROVIDED: None.  PPS: Patient is independent of ADLs. Patient continues to use her wheelchair when she is out of the home.   Due to the COVID-19, this visit was done via telephone from my office and it was initiated and consent by this patient and/or family. This was a scheduled visit.  I spent2minutes with patient/family, from12:30-1:00pproviding education, support and consultation.  Margaretmary Lombard, LCSW

## 2020-03-25 ENCOUNTER — Encounter: Payer: Self-pay | Admitting: Oncology

## 2020-03-25 ENCOUNTER — Inpatient Hospital Stay (HOSPITAL_BASED_OUTPATIENT_CLINIC_OR_DEPARTMENT_OTHER): Payer: Medicare Other | Admitting: Hospice and Palliative Medicine

## 2020-03-25 ENCOUNTER — Inpatient Hospital Stay: Payer: Medicare Other | Attending: Oncology | Admitting: Oncology

## 2020-03-25 ENCOUNTER — Other Ambulatory Visit: Payer: Self-pay

## 2020-03-25 ENCOUNTER — Inpatient Hospital Stay: Payer: Medicare Other

## 2020-03-25 ENCOUNTER — Inpatient Hospital Stay: Payer: Medicare Other | Admitting: *Deleted

## 2020-03-25 VITALS — BP 102/78 | HR 92 | Temp 96.3°F | Resp 18 | Wt 142.1 lb

## 2020-03-25 DIAGNOSIS — C3490 Malignant neoplasm of unspecified part of unspecified bronchus or lung: Secondary | ICD-10-CM

## 2020-03-25 DIAGNOSIS — Z5112 Encounter for antineoplastic immunotherapy: Secondary | ICD-10-CM | POA: Diagnosis not present

## 2020-03-25 DIAGNOSIS — F329 Major depressive disorder, single episode, unspecified: Secondary | ICD-10-CM

## 2020-03-25 DIAGNOSIS — R413 Other amnesia: Secondary | ICD-10-CM

## 2020-03-25 DIAGNOSIS — C7951 Secondary malignant neoplasm of bone: Secondary | ICD-10-CM | POA: Insufficient documentation

## 2020-03-25 DIAGNOSIS — S22000A Wedge compression fracture of unspecified thoracic vertebra, initial encounter for closed fracture: Secondary | ICD-10-CM

## 2020-03-25 DIAGNOSIS — Z515 Encounter for palliative care: Secondary | ICD-10-CM | POA: Diagnosis not present

## 2020-03-25 DIAGNOSIS — R110 Nausea: Secondary | ICD-10-CM | POA: Diagnosis not present

## 2020-03-25 DIAGNOSIS — C7931 Secondary malignant neoplasm of brain: Secondary | ICD-10-CM | POA: Insufficient documentation

## 2020-03-25 DIAGNOSIS — R6889 Other general symptoms and signs: Secondary | ICD-10-CM | POA: Diagnosis not present

## 2020-03-25 DIAGNOSIS — Z95828 Presence of other vascular implants and grafts: Secondary | ICD-10-CM

## 2020-03-25 DIAGNOSIS — F32A Depression, unspecified: Secondary | ICD-10-CM

## 2020-03-25 DIAGNOSIS — Z79899 Other long term (current) drug therapy: Secondary | ICD-10-CM | POA: Diagnosis not present

## 2020-03-25 LAB — COMPREHENSIVE METABOLIC PANEL
ALT: 13 U/L (ref 0–44)
AST: 13 U/L — ABNORMAL LOW (ref 15–41)
Albumin: 4.2 g/dL (ref 3.5–5.0)
Alkaline Phosphatase: 42 U/L (ref 38–126)
Anion gap: 10 (ref 5–15)
BUN: 18 mg/dL (ref 8–23)
CO2: 23 mmol/L (ref 22–32)
Calcium: 9.5 mg/dL (ref 8.9–10.3)
Chloride: 104 mmol/L (ref 98–111)
Creatinine, Ser: 0.81 mg/dL (ref 0.44–1.00)
GFR calc Af Amer: >60 mL/min (ref >60)
GFR calc non Af Amer: >60 mL/min (ref >60)
Glucose, Bld: 108 mg/dL — ABNORMAL HIGH (ref 70–99)
Potassium: 3.6 mmol/L (ref 3.5–5.1)
Sodium: 137 mmol/L (ref 135–145)
Total Bilirubin: 0.6 mg/dL (ref 0.3–1.2)
Total Protein: 7.5 g/dL (ref 6.5–8.1)

## 2020-03-25 LAB — TSH: TSH: 0.761 u[IU]/mL (ref 0.350–4.500)

## 2020-03-25 LAB — CBC WITH DIFFERENTIAL/PLATELET
Abs Immature Granulocytes: 0.02 10*3/uL (ref 0.00–0.07)
Basophils Absolute: 0 10*3/uL (ref 0.0–0.1)
Basophils Relative: 1 %
Eosinophils Absolute: 0.2 10*3/uL (ref 0.0–0.5)
Eosinophils Relative: 4 %
HCT: 36.8 % (ref 36.0–46.0)
Hemoglobin: 12.7 g/dL (ref 12.0–15.0)
Immature Granulocytes: 0 %
Lymphocytes Relative: 25 %
Lymphs Abs: 1.3 10*3/uL (ref 0.7–4.0)
MCH: 29.7 pg (ref 26.0–34.0)
MCHC: 34.5 g/dL (ref 30.0–36.0)
MCV: 86.2 fL (ref 80.0–100.0)
Monocytes Absolute: 0.7 10*3/uL (ref 0.1–1.0)
Monocytes Relative: 13 %
Neutro Abs: 3 10*3/uL (ref 1.7–7.7)
Neutrophils Relative %: 57 %
Platelets: 176 10*3/uL (ref 150–400)
RBC: 4.27 MIL/uL (ref 3.87–5.11)
RDW: 13.5 % (ref 11.5–15.5)
WBC: 5.2 10*3/uL (ref 4.0–10.5)
nRBC: 0 % (ref 0.0–0.2)

## 2020-03-25 MED ORDER — HEPARIN SOD (PORK) LOCK FLUSH 100 UNIT/ML IV SOLN
500.0000 [IU] | Freq: Once | INTRAVENOUS | Status: AC | PRN
  Administered 2020-03-25: 500 [IU]
  Filled 2020-03-25: qty 5

## 2020-03-25 MED ORDER — SODIUM CHLORIDE 0.9% FLUSH
10.0000 mL | Freq: Once | INTRAVENOUS | Status: AC
  Administered 2020-03-25: 10 mL via INTRAVENOUS
  Filled 2020-03-25: qty 10

## 2020-03-25 MED ORDER — SODIUM CHLORIDE 0.9 % IV SOLN
1200.0000 mg | Freq: Once | INTRAVENOUS | Status: AC
  Administered 2020-03-25: 1200 mg via INTRAVENOUS
  Filled 2020-03-25: qty 20

## 2020-03-25 MED ORDER — DEXAMETHASONE SODIUM PHOSPHATE 10 MG/ML IJ SOLN
10.0000 mg | Freq: Once | INTRAMUSCULAR | Status: AC
  Administered 2020-03-25: 11:00:00 10 mg via INTRAVENOUS
  Filled 2020-03-25: qty 1

## 2020-03-25 MED ORDER — HEPARIN SOD (PORK) LOCK FLUSH 100 UNIT/ML IV SOLN
INTRAVENOUS | Status: AC
  Filled 2020-03-25: qty 5

## 2020-03-25 MED ORDER — SODIUM CHLORIDE 0.9 % IV SOLN
Freq: Once | INTRAVENOUS | Status: AC
  Filled 2020-03-25: qty 250

## 2020-03-25 NOTE — Progress Notes (Signed)
Ordered TSH level. Last was done on 01/22/20.

## 2020-03-25 NOTE — Progress Notes (Signed)
Patient here for follow up. Reports being very sleepy but unable to sleep. Also complains of nausea.

## 2020-03-25 NOTE — Progress Notes (Signed)
[place]  Telephone:([phone] [phone]   Name: Kristi [name] [facility] Date: 03/25/2020 MRN: [id number]  DOB: 02/14/1946  Patient Care Team: Glean Hess, MD as PCP - General (Internal Medicine) Charolette Forward, MD as Consulting Physician (Cardiology) Telford Nab, RN as Registered Nurse [name], MD as Radiation Oncologist (Radiation Oncology)    REASON FOR CONSULTATION: Kristi [name] is a 74 y.o. 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  PMH also notable for CAD with history of acute MI, claustrophobia, hypertension, and hyperlipidemia.    Patient status post chemo on maintenance immunotherapy.  Palliative care was asked to help address goals and symptoms.  SOCIAL HISTORY:    Patient is married and lives at home with her husband of 52 years.  They moved here from [place], [place] around 4 years ago.  Patient has 2 sons and a daughter.  One son lives in [place] and the other 2 children are in [place].  Patient used to work for a [employer] and then later for the local [employer].  ADVANCE DIRECTIVES:  Not on file  CODE STATUS: DNR (MOST form completed on 11/24/18)  PAST MEDICAL HISTORY: Past Medical History:  Diagnosis Date  . Acute MI, inferoposterior wall ([place]) 09/30/2014  . Claustrophobia   . Coronary artery disease   . GERD (gastroesophageal reflux disease)   . Hypercholesteremia   . Hypertension   . MI, old   . Pneumonia   . 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 [facility]) 10/27/2018    PAST SURGICAL HISTORY:  Past Surgical History:  Procedure Laterality Date  . APPENDECTOMY     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  . BLADDER NECK SUSPENSION    . CHOLECYSTECTOMY N/A 08/14/2018   Procedure: LAPAROSCOPIC CHOLECYSTECTOMY;  Surgeon: Jules Husbands, MD;  Location: [facility] ORS;  Service: General;   Laterality: N/A;  . CHOLECYSTECTOMY  11/2018  . CORONARY ANGIOPLASTY  09/29/2014  . CORONARY STENT PLACEMENT    . ENDOBRONCHIAL ULTRASOUND N/A 10/21/2018   Procedure: ENDOBRONCHIAL ULTRASOUND;  Surgeon: Laverle Hobby, MD;  Location: [facility] ORS;  Service: Pulmonary;  Laterality: N/A;  . HERNIA REPAIR    . IR FLUORO GUIDE CV LINE RIGHT  12/19/2018  . KYPHOPLASTY N/A 03/01/2020   Procedure: T7 KYPHOPLASTY;  Surgeon: Hessie Knows, MD;  Location: [facility] ORS;  Service: Orthopedics;  Laterality: N/A;  . LEFT HEART CATHETERIZATION WITH CORONARY ANGIOGRAM N/A 09/29/2014   Procedure: LEFT HEART CATHETERIZATION WITH CORONARY ANGIOGRAM;  Surgeon: Clent Demark, MD;  Location: [place] CATH LAB;  Service: Cardiovascular;  Laterality: N/A;  . PORTA CATH INSERTION N/A 01/02/2019   Procedure: PORTA CATH INSERTION;  Surgeon: Algernon Huxley, MD;  Location: [place] CV LAB;  Service: Cardiovascular;  Laterality: N/A;  . PORTACATH PLACEMENT Right 10/28/2018   Procedure: INSERTION PORT-A-CATH;  Surgeon: Jules Husbands, MD;  Location: [facility] ORS;  Service: General;  Laterality: Right;    HEMATOLOGY/ONCOLOGY HISTORY:  Oncology History Overview Note  Kristi [name] is a  74 y.o.  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   10/20/2018 CT chest with contrast showed large mediastinal mass involving both hilar, left greater than right, consistent with lung carcinoma, The mass causes narrowing of the left mainstem bronchus with resultant volume loss on the left and a mediastinal shift to the  left.  Moderate size left pleural effusion. 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   # Nov 2019- Jan 2020 s/p 4 cycles of Carbo/Etoposide/Tecentriq #  01/24/2019 interim CT scan done which showed continued positive response to therapy with continued reduction in mediastinal adenopathy.  No  residual measurable left lung mass. CT findings of acute emphysematous cystitis. Urology did not feel that patient has pyelonephritis and recommend treatment with antibiotics because patient's immunocompromise. Patient finished treatment.    # Chest and whole brain radiation finished in May 2020 # 04/25/2019 resume Tecentriq every 3 weeks.   Small cell lung cancer ([place])  10/27/2018 Initial Diagnosis   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 [facility])   11/02/2018 -  Chemotherapy   The patient had palonosetron (ALOXI) injection 0.25 mg, 0.25 mg, Intravenous,  Once, 4 of 4 cycles Administration: 0.25 mg (11/02/2018), 0.25 mg (11/23/2018), 0.25 mg (12/19/2018), 0.25 mg (01/09/2019) pegfilgrastim-cbqv (UDENYCA) injection 6 mg, 6 mg, Subcutaneous, Once, 4 of 4 cycles Administration: 6 mg (11/07/2018), 6 mg (11/28/2018), 6 mg (12/23/2018), 6 mg (01/12/2019) CARBOplatin (PARAPLATIN) 390 mg in sodium chloride 0.9 % 250 mL chemo infusion, 390 mg (100 % of original dose 394.5 mg), Intravenous,  Once, 4 of 4 cycles Dose modification:   (original dose 394.5 mg, Cycle 1) Administration: 390 mg (11/02/2018), 390 mg (11/23/2018), 390 mg (12/19/2018), 390 mg (01/09/2019) etoposide (VEPESID) 180 mg in sodium chloride 0.9 % 500 mL chemo infusion, 100 mg/m2 = 180 mg, Intravenous,  Once, 4 of 4 cycles Administration: 180 mg (11/02/2018), 180 mg (11/03/2018), 180 mg (11/04/2018), 180 mg (11/23/2018), 180 mg (11/24/2018), 180 mg (11/25/2018), 180 mg (12/19/2018), 180 mg (12/20/2018), 180 mg (12/22/2018), 180 mg (01/09/2019), 180 mg (01/10/2019), 180 mg (01/11/2019) fosaprepitant (EMEND) 150 mg, dexamethasone (DECADRON) 12 mg in sodium chloride 0.9 % 145 mL IVPB, , Intravenous,  Once, 4 of 4 cycles Administration:  (11/02/2018),  (11/23/2018),  (12/19/2018),  (01/09/2019) atezolizumab (TECENTRIQ) 1,200 mg in sodium chloride 0.9 % 250 mL chemo infusion, 1,200 mg, Intravenous, Once, 19 of 28 cycles Administration: 1,200 mg (11/23/2018), 1,200 mg  (12/19/2018), 1,200 mg (01/09/2019), 1,200 mg (04/24/2019), 1,200 mg (05/18/2019), 1,200 mg (06/15/2019), 1,200 mg (07/13/2019), 1,200 mg (08/03/2019), 1,200 mg (08/24/2019), 1,200 mg (09/14/2019), 1,200 mg (10/05/2019), 1,200 mg (10/26/2019), 1,200 mg (01/01/2020), 1,200 mg (12/11/2019), 1,200 mg (11/20/2019), 1,200 mg (01/22/2020), 1,200 mg (02/12/2020), 1,200 mg (03/04/2020), 1,200 mg (03/25/2020)  for chemotherapy treatment.      ALLERGIES:  has No Known Allergies.  MEDICATIONS:  Current Outpatient Medications  Medication Sig Dispense Refill  . acetaminophen (TYLENOL) 500 MG tablet Take 500 mg by mouth 2 (two) times daily as needed for moderate pain or headache.    Huey Bienenstock (TECENTRIQ IV) Inject 1 Dose into the vein every 30 (thirty) days.    . Cholecalciferol (DIALYVITE VITAMIN D 5000) 125 MCG (5000 UT) capsule Take 5,000 Units by mouth daily.    . diphenhydrAMINE-zinc acetate (BENADRYL EXTRA STRENGTH) cream Apply 1 application topically 3 (three) times daily as needed for itching. (Patient not taking: Reported on 03/25/2020) 28.4 g 0  . fentaNYL (DURAGESIC) 25 MCG/HR Place 1 patch onto the skin every 3 (three) days. 10 patch 0  . HYDROcodone-acetaminophen (NORCO/VICODIN) 5-325 MG tablet Take 1 tablet by mouth every 6 (six) hours as needed for moderate pain. 90 tablet 0  . lidocaine-prilocaine (EMLA) cream Apply to affected area once (Patient taking differently: Apply 1 application topically daily as needed (port  access). ) 30 g 3  . loperamide (IMODIUM A-D) 2 MG tablet Take 2 mg by mouth 4 (four) times daily as needed for diarrhea or loose stools.    . nitroGLYCERIN (NITROSTAT) 0.4 MG SL tablet Place 1 tablet (0.4 mg total) under the tongue every 5 (five) minutes x 3 doses as needed for chest pain. (Patient not taking: Reported on 03/04/2020) 25 tablet 12  . omeprazole (PRILOSEC) 40 MG capsule TAKE ONE CAPSULE BY MOUTH DAILY (Patient taking differently: Take 40 mg by mouth daily as needed (acid reflux). )  90 capsule 1  . polyethylene glycol (MIRALAX / GLYCOLAX) packet Take 17 g by mouth daily as needed for mild constipation.     . sertraline (ZOLOFT) 50 MG tablet Take 1.5 tablets (75 mg total) by mouth daily. 45 tablet 1  . vitamin B-12 (CYANOCOBALAMIN) 1000 MCG tablet Take 1,000 mcg by mouth daily.    . Zoledronic Acid (ZOMETA IV) Inject 1 Dose into the vein every 30 (thirty) days.     No current facility-administered medications for this visit.   Facility-Administered Medications Ordered in Other Visits  Medication Dose Route Frequency Provider Last Rate Last Admin  . heparin lock flush 100 unit/mL  500 Units Intravenous Once Earlie Server, MD      . heparin lock flush 100 unit/mL  500 Units Intravenous Once Earlie Server, MD      . sodium chloride flush (NS) 0.9 % injection 10 mL  10 mL Intravenous PRN Earlie Server, MD   10 mL at 01/09/19 0820  . sodium chloride flush (NS) 0.9 % injection 10 mL  10 mL Intravenous PRN Earlie Server, MD   10 mL at 01/10/19 1400    VITAL SIGNS: There were no vitals taken for this visit. There were no vitals filed for this visit.  Estimated body mass index is 22.26 kg/m as calculated from the following:   Height as of 02/28/20: 5\' 7"  (1.702 m).   Weight as of an earlier encounter on 03/25/20: 142 lb 1.6 oz (64.5 kg).  LABS: CBC:    Component Value Date/Time   WBC 5.2 03/25/2020 0909   HGB 12.7 03/25/2020 0909   HGB 14.5 09/19/2018 1038   HCT 36.8 03/25/2020 0909   HCT 42.7 09/19/2018 1038   PLT 176 03/25/2020 0909   PLT 265 09/19/2018 1038   MCV 86.2 03/25/2020 0909   MCV 91 09/19/2018 1038   NEUTROABS 3.0 03/25/2020 0909   NEUTROABS 5.1 09/19/2018 1038   LYMPHSABS 1.3 03/25/2020 0909   LYMPHSABS 2.0 09/19/2018 1038   MONOABS 0.7 03/25/2020 0909   EOSABS 0.2 03/25/2020 0909   EOSABS 0.3 09/19/2018 1038   BASOSABS 0.0 03/25/2020 0909   BASOSABS 0.1 09/19/2018 1038   Comprehensive Metabolic Panel:    Component Value Date/Time   NA 137 03/25/2020 0909   NA  136 09/19/2018 1038   K 3.6 03/25/2020 0909   CL 104 03/25/2020 0909   CO2 23 03/25/2020 0909   BUN 18 03/25/2020 0909   BUN 6 (L) 09/19/2018 1038   CREATININE 0.81 03/25/2020 0909   GLUCOSE 108 (H) 03/25/2020 0909   CALCIUM 9.5 03/25/2020 0909   AST 13 (L) 03/25/2020 0909   ALT 13 03/25/2020 0909   ALKPHOS 42 03/25/2020 0909   BILITOT 0.6 03/25/2020 0909   BILITOT 0.5 09/19/2018 1038   PROT 7.5 03/25/2020 0909   PROT 7.4 09/19/2018 1038   ALBUMIN 4.2 03/25/2020 0909   ALBUMIN 4.3 09/19/2018 1038  RADIOGRAPHIC STUDIES: DG Thoracic Spine 2 View  Result Date: 03/01/2020 CLINICAL DATA:  T7 kyphoplasty. EXAM: THORACIC SPINE 2 VIEWS; DG C-ARM 1-60 MIN COMPARISON:  Chest CT 01/19/2020 FINDINGS: Frontal and lateral fluoroscopic spot views of the thoracic spine obtained in the operating room. There is kyphoplasty material about midthoracic vertebra, level not delineated due to coned field of view. Reported fluoroscopy time 2 minutes 40 seconds. IMPRESSION: Fluoroscopic spot views after thoracic kyphoplasty. Electronically Signed   By: Keith Rake M.D.   On: 03/01/2020 14:06   DG C-Arm 1-60 Min  Result Date: 03/01/2020 CLINICAL DATA:  T7 kyphoplasty. EXAM: THORACIC SPINE 2 VIEWS; DG C-ARM 1-60 MIN COMPARISON:  Chest CT 01/19/2020 FINDINGS: Frontal and lateral fluoroscopic spot views of the thoracic spine obtained in the operating room. There is kyphoplasty material about midthoracic vertebra, level not delineated due to coned field of view. Reported fluoroscopy time 2 minutes 40 seconds. IMPRESSION: Fluoroscopic spot views after thoracic kyphoplasty. Electronically Signed   By: Keith Rake M.D.   On: 03/01/2020 14:06    PERFORMANCE STATUS (ECOG) : 2-3  Review of Systems Unless otherwise noted, a complete review of systems is negative.  Physical Exam General: NAD, frail appearing, thin Pulmonary: Unlabored Extremities: no edema, no joint deformities Skin: no  rashes Neurological: Weakness but otherwise nonfocal  IMPRESSION: Routine follow-up visit today.  Patient was accompanied by her husband.  Patient denies any significant changes.  She does have persistent weakness but finds use of a walker helps her with ambulation.  She also endorses some occasional nausea.  Oral intake is stable.  Weight is stable at 142 pounds.  Husband verbalized some concerns regarding confusion.  Patient has had more difficulty remembering things over the past few months.  Likely confusion is secondary to sequelae from cancer/radiation.  She did have a CT of the brain in January, which did not have acute findings.  She is pending repeat imaging.  TSH was also recently checked.  We will add a B12 level to next labs.  PLAN: -Continue current scope of treatment -Continue as needed Norco for breakthrough pain.  Patient not using fentanyl. -B12 at time of next labs -RTC in 3 weeks   Patient expressed understanding and was in agreement with this plan. She also understands that She can call the clinic at any time with any questions, concerns, or complaints.     Time Total: 15 minutes  Visit consisted of counseling and education dealing with the complex and emotionally intense issues of symptom management and palliative care in the setting of serious and potentially life-threatening illness.Greater than 50%  of this time was spent counseling and coordinating care related to the above assessment and plan.  Signed by: Altha Harm, PhD, NP-C [phone] (Work Cell)

## 2020-03-25 NOTE — Progress Notes (Signed)
Hematology/Oncology Follow up note [facility] Telephone:([phone] Fax:([phone]) [phone]   Patient Care Team: Glean Hess, MD as PCP - General (Internal Medicine) Charolette Forward, MD as Consulting Physician (Cardiology) Telford Nab, RN as Registered Nurse [name], MD as Radiation Oncologist (Radiation Oncology)  REFERRING PROVIDER: Dr. Felicie Morn REASON FOR VISIT:  Follow-up for small cell lung cancer,   HISTORY OF PRESENTING ILLNESS:  Kristi [name] is a  74 y.o.  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  10/20/2018 CT chest with contrast showed large mediastinal mass involving both hilar, left greater than right, consistent with lung carcinoma, The mass causes narrowing of the left mainstem bronchus with resultant volume loss on the left and a mediastinal shift to the left.  Moderate size left pleural effusion. 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  # MRI brain negative.  # Nov 2019- Jan 2020 s/p 4 cycles of Carbo/Etoposide/Tecentriq #  01/24/2019 interim CT scan done which showed continued positive response to therapy with continued reduction in mediastinal adenopathy.  No residual measurable left lung mass. CT findings of acute emphysematous cystitis. Urology did not feel that patient has pyelonephritis and recommend treatment with antibiotics because patient's immunocompromise. Patient finished treatment.   # Chest and whole brain radiation finished in May 2020 # 04/25/2019 resume Tecentriq every 3 weeks. # Patient had a fall from her stairs on 01/19/2020. In the emergency room she had CT chest abdomen pelvis CT, CT head without contrast, CT cervical spine without contrast. No CT evidence for acute intracranial abnormality, degenerative changes of cervical spine..  No CT evidence of acute thoracic abdomen  injury.  Severe compression fracture of T7, subacute.   INTERVAL HISTORY Kristi [name] is a 74 y.o. female who has above history reviewed by me today presents for evaluation prior to immunotherapy for treatment of extensive small cell lung cancer. Patient is on immunotherapy maintenance. Patient has had kyphoplasty procedure on 03/01/2020.  T7 biopsy negative for cancer. Patient reports pain is slightly better. She reports some nausea, for which she takes antiemetics with symptom relief. Also reports having heartburn.  She does not take omeprazole daily. Recently, she reports having more headache. Continues to be forgetful.   Review of Systems  Constitutional: Positive for fatigue. Negative for appetite change, chills and fever.  HENT:   Negative for hearing loss and voice change.   Eyes: Negative for eye problems.  Respiratory: Negative for chest tightness, cough and shortness of breath.   Cardiovascular: Negative for chest pain.  Gastrointestinal: Positive for nausea. Negative for abdominal distention, abdominal pain, blood in stool and diarrhea.  Endocrine: Negative for hot flashes.  Genitourinary: Negative for difficulty urinating and frequency.   Musculoskeletal: Positive for back pain. Negative for arthralgias.  Skin: Negative for itching and rash.  Neurological: Negative for extremity weakness.  Hematological: Negative for adenopathy.  Psychiatric/Behavioral: Negative for confusion and depression. The patient is not nervous/anxious.        Forgetful     MEDICAL HISTORY:  Past Medical History:  Diagnosis Date   Acute MI, inferoposterior wall ([place]) 09/30/2014   Claustrophobia    Coronary artery disease    GERD (gastroesophageal reflux disease)    Hypercholesteremia    Hypertension    MI, old    Pneumonia    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 ([place]) 10/27/2018    SURGICAL HISTORY:  Past Surgical History:  Procedure Laterality Date   APPENDECTOMY     benign  tumor on liver found   BLADDER NECK SUSPENSION     CHOLECYSTECTOMY N/A 08/14/2018   Procedure: LAPAROSCOPIC CHOLECYSTECTOMY;  Surgeon: Jules Husbands, MD;  Location: [facility] ORS;  Service: General;  Laterality: N/A;   CHOLECYSTECTOMY  11/2018   CORONARY ANGIOPLASTY  09/29/2014   CORONARY STENT PLACEMENT     ENDOBRONCHIAL ULTRASOUND N/A 10/21/2018   Procedure: ENDOBRONCHIAL ULTRASOUND;  Surgeon: Laverle Hobby, MD;  Location: [facility] ORS;  Service: Pulmonary;  Laterality: N/A;   HERNIA REPAIR     IR FLUORO GUIDE CV LINE RIGHT  12/19/2018   KYPHOPLASTY N/A 03/01/2020   Procedure: T7 KYPHOPLASTY;  Surgeon: Hessie Knows, MD;  Location: [facility] ORS;  Service: Orthopedics;  Laterality: N/A;   LEFT HEART CATHETERIZATION WITH CORONARY ANGIOGRAM N/A 09/29/2014   Procedure: LEFT HEART CATHETERIZATION WITH CORONARY ANGIOGRAM;  Surgeon: Clent Demark, MD;  Location: [facility];  Service: Cardiovascular;  Laterality: N/A;   PORTA CATH INSERTION N/A 01/02/2019   Procedure: PORTA CATH INSERTION;  Surgeon: Algernon Huxley, MD;  Location: [facility];  Service: Cardiovascular;  Laterality: N/A;   PORTACATH PLACEMENT Right 10/28/2018   Procedure: INSERTION PORT-A-CATH;  Surgeon: Jules Husbands, MD;  Location: [facility] ORS;  Service: General;  Laterality: Right;    SOCIAL HISTORY: Social History   Socioeconomic History   Marital status: Married    Spouse name: [name]    Number of children: 3   Years of education: Not on file   Highest education level: Not on file  Occupational History   Occupation: Retired    Comment: Chief Technology Officer   Tobacco Use   Smoking status: Former Smoker    Packs/day: 1.00    Years: 39.00    Pack years: 39.00    Types: Cigarettes    Start date: 12/21/1978    Quit date: 10/16/2018    Years since quitting: 1.4   Smokeless tobacco: Never Used  Substance and Sexual Activity   Alcohol use: Yes    Alcohol/week: 0.0 standard drinks    Comment: occassional -  approx 1 every 2 weeks    Drug use: No   Sexual activity: Yes    Birth control/protection: None  Other Topics Concern   Not on file  Social History Narrative   Not on file   Social Determinants of Health   Financial Resource Strain:    Difficulty of Paying Living Expenses:   Food Insecurity:    Worried About Charity fundraiser in the Last Year:    Arboriculturist in the Last Year:   Transportation Needs:    Film/video editor (Medical):    Lack of Transportation (Non-Medical):   Physical Activity:    Days of Exercise per Week:    Minutes of Exercise per Session:   Stress:    Feeling of Stress :   Social Connections:    Frequency of Communication with Friends and Family:    Frequency of Social Gatherings with Friends and Family:    Attends Religious Services:    Active Member of Clubs or Organizations:    Attends Music therapist:    Marital Status:   Intimate Partner Violence:    Fear of Current or Ex-Partner:    Emotionally Abused:    Physically Abused:    Sexually Abused:     FAMILY HISTORY: Family History  Problem Relation Age of Onset  Alzheimer's disease Mother    Colon cancer Father    Breast cancer Neg Hx     ALLERGIES:  has No Known Allergies.  MEDICATIONS:  Current Outpatient Medications  Medication Sig Dispense Refill   acetaminophen (TYLENOL) 500 MG tablet Take 500 mg by mouth 2 (two) times daily as needed for moderate pain or headache.     Atezolizumab (TECENTRIQ IV) Inject 1 Dose into the vein every 30 (thirty) days.     Cholecalciferol (DIALYVITE VITAMIN D 5000) 125 MCG (5000 UT) capsule Take 5,000 Units by mouth daily.     fentaNYL (DURAGESIC) 25 MCG/HR Place 1 patch onto the skin every 3 (three) days. 10 patch 0   HYDROcodone-acetaminophen (NORCO/VICODIN) 5-325 MG tablet Take 1 tablet by mouth every 6 (six) hours as needed for moderate pain. 90 tablet 0   lidocaine-prilocaine (EMLA) cream Apply  to affected area once (Patient taking differently: Apply 1 application topically daily as needed (port access). ) 30 g 3   loperamide (IMODIUM A-D) 2 MG tablet Take 2 mg by mouth 4 (four) times daily as needed for diarrhea or loose stools.     omeprazole (PRILOSEC) 40 MG capsule TAKE ONE CAPSULE BY MOUTH DAILY (Patient taking differently: Take 40 mg by mouth daily as needed (acid reflux). ) 90 capsule 1   polyethylene glycol (MIRALAX / GLYCOLAX) packet Take 17 g by mouth daily as needed for mild constipation.      sertraline (ZOLOFT) 50 MG tablet Take 1.5 tablets (75 mg total) by mouth daily. 45 tablet 1   vitamin B-12 (CYANOCOBALAMIN) 1000 MCG tablet Take 1,000 mcg by mouth daily.     Zoledronic Acid (ZOMETA IV) Inject 1 Dose into the vein every 30 (thirty) days.     diphenhydrAMINE-zinc acetate (BENADRYL EXTRA STRENGTH) cream Apply 1 application topically 3 (three) times daily as needed for itching. (Patient not taking: Reported on 03/25/2020) 28.4 g 0   nitroGLYCERIN (NITROSTAT) 0.4 MG SL tablet Place 1 tablet (0.4 mg total) under the tongue every 5 (five) minutes x 3 doses as needed for chest pain. (Patient not taking: Reported on 03/04/2020) 25 tablet 12   No current facility-administered medications for this visit.   Facility-Administered Medications Ordered in Other Visits  Medication Dose Route Frequency Provider Last Rate Last Admin   heparin lock flush 100 unit/mL  500 Units Intravenous Once Earlie Server, MD       heparin lock flush 100 unit/mL  500 Units Intravenous Once Earlie Server, MD       sodium chloride flush (NS) 0.9 % injection 10 mL  10 mL Intravenous PRN Earlie Server, MD   10 mL at 01/09/19 0820   sodium chloride flush (NS) 0.9 % injection 10 mL  10 mL Intravenous PRN Earlie Server, MD   10 mL at 01/10/19 1400     PHYSICAL EXAMINATION: ECOG PERFORMANCE STATUS: 1 - Symptomatic but completely ambulatory Vitals:   03/25/20 0929  BP: 102/78  Pulse: 92  Resp: 18  Temp: (!) 96.3 F  (35.7 C)  SpO2: 99%   Filed Weights   03/25/20 0929  Weight: 142 lb 1.6 oz (64.5 kg)   Physical Exam  Constitutional: She is oriented to person, place, and time. No distress.  HENT:  Head: Normocephalic and atraumatic.  Nose: Nose normal.  Mouth/Throat: Oropharynx is clear and moist. No oropharyngeal exudate.  Eyes: Pupils are equal, round, and reactive to light. EOM are normal. No scleral icterus.  Cardiovascular: Normal rate and regular rhythm.  No murmur heard. Pulmonary/Chest: Effort normal. No respiratory distress. She has no rales. She exhibits no tenderness.  Decreased breath sounds bilaterally, right side breath sound is more diminished.   Abdominal: Soft. She exhibits no distension. There is no abdominal tenderness.  Musculoskeletal:        General: No edema. Normal range of motion.     Cervical back: Normal range of motion and neck supple.  Neurological: She is alert and oriented to person, place, and time. No cranial nerve deficit. She exhibits normal muscle tone. Coordination normal.  Skin: Skin is warm and dry. She is not diaphoretic. No erythema.  Psychiatric: Affect normal.       LABORATORY DATA:  I have reviewed the data as listed Lab Results  Component Value Date   WBC 5.2 03/25/2020   HGB 12.7 03/25/2020   HCT 36.8 03/25/2020   MCV 86.2 03/25/2020   PLT 176 03/25/2020   Recent Labs    02/12/20 0921 03/04/20 0827 03/25/20 0909  NA 138 131* 137  K 3.7 3.8 3.6  CL 105 102 104  CO2 24 22 23   GLUCOSE 924* 111* 108*  BUN 16 14 18   CREATININE 0.76 0.89 0.81  CALCIUM 9.1 8.8* 9.5  GFRNONAA >60 >60 >60  GFRAA >60 >60 >60  PROT 7.1 7.0 7.5  ALBUMIN 4.0 4.1 4.2  AST 13* 15 13*  ALT 10 11 13   ALKPHOS 55 47 42  BILITOT 0.5 0.8 0.6    RADIOGRAPHIC STUDIES: I have personally reviewed the radiological images as listed and agreed with the findings in the report.  DG Thoracic Spine 2 View  Result Date: 03/01/2020 CLINICAL DATA:  T7 kyphoplasty.  EXAM: THORACIC SPINE 2 VIEWS; DG C-ARM 1-60 MIN COMPARISON:  Chest CT 01/19/2020 FINDINGS: Frontal and lateral fluoroscopic spot views of the thoracic spine obtained in the operating room. There is kyphoplasty material about midthoracic vertebra, level not delineated due to coned field of view. Reported fluoroscopy time 2 minutes 40 seconds. IMPRESSION: Fluoroscopic spot views after thoracic kyphoplasty. Electronically Signed   By: Keith Rake M.D.   On: 03/01/2020 14:06   DG Wrist Complete Left  Result Date: 01/19/2020 CLINICAL DATA:  [place] [address] down 5 stairs 2 days ago with left wrist pain. EXAM: LEFT WRIST - COMPLETE 3+ VIEW COMPARISON:  None. FINDINGS: Mild degenerate change over the radiocarpal joint and first carpometacarpal joints. Mild osteopenia is present. No evidence of acute fracture or dislocation. IMPRESSION: No acute fracture. Electronically Signed   By: Marin Olp M.D.   On: 01/19/2020 15:04   CT Head Wo Contrast  Result Date: 01/19/2020 CLINICAL DATA:  [address] down stairs bruising to the left forehead puncture wound EXAM: CT HEAD WITHOUT CONTRAST CT CERVICAL SPINE WITHOUT CONTRAST TECHNIQUE: Multidetector CT imaging of the head and cervical spine was performed following the standard protocol without intravenous contrast. Multiplanar CT image reconstructions of the cervical spine were also generated. COMPARISON:  MRI 12/01/2019, CT brain 07/27/2016 FINDINGS: CT HEAD FINDINGS Brain: No acute territorial infarction, hemorrhage or intracranial mass is visualized. Extensive white matter hypodensity throughout the brain, felt consistent with history of whole brain radiation. Mild atrophy. Stable ventricle size Vascular: No hyperdense vessels.  Carotid vascular calcification Skull: Normal. Negative for fracture or focal lesion. Sinuses/Orbits: No acute finding. Other: Small forehead scalp hematoma CT CERVICAL SPINE FINDINGS Alignment: No subluxation.  Facet alignment is normal. Skull base and  vertebrae: No acute fracture. No primary bone lesion or focal pathologic process. Soft tissues and spinal  canal: No prevertebral fluid or swelling. No visible canal hematoma. Disc levels: Advanced degenerative changes C4-C5, C5-C6 and C6-C7. Multiple level facet degenerative change. Upper chest: Emphysema Other: None IMPRESSION: 1. No CT evidence for acute intracranial abnormality. 2. Extensive white matter disease, felt consistent with history of whole brain radiation. 3. Degenerative changes of the cervical spine. No acute osseous abnormality 4. Emphysema Electronically Signed   By: Donavan Foil M.D.   On: 01/19/2020 18:25   CT Chest W Contrast  Result Date: 01/19/2020 CLINICAL DATA:  [place] [address] down concrete steps 2 days ago. Blunt chest and abdominal trauma, with pain and tenderness. Initial encounter. EXAM: CT CHEST, ABDOMEN, AND PELVIS WITH CONTRAST TECHNIQUE: Multidetector CT imaging of the chest, abdomen and pelvis was performed following the standard protocol during bolus administration of intravenous contrast. CONTRAST:  151mL OMNIPAQUE IOHEXOL 300 MG/ML  SOLN COMPARISON:  10/23/2019 FINDINGS: CT CHEST FINDINGS Cardiovascular: No evidence of thoracic aortic injury or mediastinal hematoma. No pericardial effusion. Aortic and coronary artery atherosclerosis incidentally noted. Mediastinum/Nodes: No evidence of pneumomediastinum. No masses or pathologically enlarged lymph nodes identified. Lungs/Pleura: No evidence of pulmonary contusion or other infiltrate. Bibasilar atelectasis or scarring noted. No evidence of pneumothorax or hemothorax. Musculoskeletal: A severe compression fracture of the T7 vertebral body is seen. This was not seen on previous study approximately 3 months ago, but shows sclerosis indicating that it is subacute in age. CT ABDOMEN PELVIS FINDINGS Hepatobiliary: No hepatic laceration or mass identified. Prior cholecystectomy. No evidence of biliary obstruction. Pancreas: No parenchymal  laceration, mass, or inflammatory changes identified. Spleen: No evidence of splenic laceration. Adrenal/Urinary Tract: No hemorrhage or parenchymal lacerations identified. No evidence of mass or hydronephrosis. Stomach/Bowel: No evidence of bowel wall thickening or dilatation. No evidence of hemoperitoneum. Diverticulosis is seen mainly involving the sigmoid colon, however there is no evidence of diverticulitis. A moderate ventral abdominal wall hernia is again seen containing the multiple small bowel loops. No evidence of small bowel obstruction or strangulation. Vascular/Lymphatic: No evidence of abdominal aortic injury or retroperitoneal hemorrhage. Aortic atherosclerosis incidentally noted. No pathologically enlarged lymph nodes identified. Reproductive:  No mass or other significant abnormality identified. Other:  None. Musculoskeletal: No acute fractures or suspicious bone lesions identified. IMPRESSION: 1. Severe compression fracture of T7 vertebral body, likely subacute in age. 2. No evidence of thoracic aortic injury or mediastinal hematoma. 3. No evidence of abdominal organ injury or hemoperitoneum. 4. Moderate ventral abdominal wall hernia containing multiple small bowel loops. 5. Colonic diverticulosis, without radiographic evidence of diverticulitis. 6. Aortic and coronary artery atherosclerosis. Electronically Signed   By: Marlaine Hind M.D.   On: 01/19/2020 18:31   CT Cervical Spine Wo Contrast  Result Date: 01/19/2020 CLINICAL DATA:  [place] down stairs bruising to the left forehead puncture wound EXAM: CT HEAD WITHOUT CONTRAST CT CERVICAL SPINE WITHOUT CONTRAST TECHNIQUE: Multidetector CT imaging of the head and cervical spine was performed following the standard protocol without intravenous contrast. Multiplanar CT image reconstructions of the cervical spine were also generated. COMPARISON:  MRI 12/01/2019, CT brain 07/27/2016 FINDINGS: CT HEAD FINDINGS Brain: No acute territorial infarction,  hemorrhage or intracranial mass is visualized. Extensive white matter hypodensity throughout the brain, felt consistent with history of whole brain radiation. Mild atrophy. Stable ventricle size Vascular: No hyperdense vessels.  Carotid vascular calcification Skull: Normal. Negative for fracture or focal lesion. Sinuses/Orbits: No acute finding. Other: Small forehead scalp hematoma CT CERVICAL SPINE FINDINGS Alignment: No subluxation.  Facet alignment is normal. Skull base and vertebrae:  No acute fracture. No primary bone lesion or focal pathologic process. Soft tissues and spinal canal: No prevertebral fluid or swelling. No visible canal hematoma. Disc levels: Advanced degenerative changes C4-C5, C5-C6 and C6-C7. Multiple level facet degenerative change. Upper chest: Emphysema Other: None IMPRESSION: 1. No CT evidence for acute intracranial abnormality. 2. Extensive white matter disease, felt consistent with history of whole brain radiation. 3. Degenerative changes of the cervical spine. No acute osseous abnormality 4. Emphysema Electronically Signed   By: Donavan Foil M.D.   On: 01/19/2020 18:25   CT Abdomen Pelvis W Contrast  Result Date: 01/19/2020 CLINICAL DATA:  [place] [address] down concrete steps 2 days ago. Blunt chest and abdominal trauma, with pain and tenderness. Initial encounter. EXAM: CT CHEST, ABDOMEN, AND PELVIS WITH CONTRAST TECHNIQUE: Multidetector CT imaging of the chest, abdomen and pelvis was performed following the standard protocol during bolus administration of intravenous contrast. CONTRAST:  17mL OMNIPAQUE IOHEXOL 300 MG/ML  SOLN COMPARISON:  10/23/2019 FINDINGS: CT CHEST FINDINGS Cardiovascular: No evidence of thoracic aortic injury or mediastinal hematoma. No pericardial effusion. Aortic and coronary artery atherosclerosis incidentally noted. Mediastinum/Nodes: No evidence of pneumomediastinum. No masses or pathologically enlarged lymph nodes identified. Lungs/Pleura: No evidence of pulmonary  contusion or other infiltrate. Bibasilar atelectasis or scarring noted. No evidence of pneumothorax or hemothorax. Musculoskeletal: A severe compression fracture of the T7 vertebral body is seen. This was not seen on previous study approximately 3 months ago, but shows sclerosis indicating that it is subacute in age. CT ABDOMEN PELVIS FINDINGS Hepatobiliary: No hepatic laceration or mass identified. Prior cholecystectomy. No evidence of biliary obstruction. Pancreas: No parenchymal laceration, mass, or inflammatory changes identified. Spleen: No evidence of splenic laceration. Adrenal/Urinary Tract: No hemorrhage or parenchymal lacerations identified. No evidence of mass or hydronephrosis. Stomach/Bowel: No evidence of bowel wall thickening or dilatation. No evidence of hemoperitoneum. Diverticulosis is seen mainly involving the sigmoid colon, however there is no evidence of diverticulitis. A moderate ventral abdominal wall hernia is again seen containing the multiple small bowel loops. No evidence of small bowel obstruction or strangulation. Vascular/Lymphatic: No evidence of abdominal aortic injury or retroperitoneal hemorrhage. Aortic atherosclerosis incidentally noted. No pathologically enlarged lymph nodes identified. Reproductive:  No mass or other significant abnormality identified. Other:  None. Musculoskeletal: No acute fractures or suspicious bone lesions identified. IMPRESSION: 1. Severe compression fracture of T7 vertebral body, likely subacute in age. 2. No evidence of thoracic aortic injury or mediastinal hematoma. 3. No evidence of abdominal organ injury or hemoperitoneum. 4. Moderate ventral abdominal wall hernia containing multiple small bowel loops. 5. Colonic diverticulosis, without radiographic evidence of diverticulitis. 6. Aortic and coronary artery atherosclerosis. Electronically Signed   By: Marlaine Hind M.D.   On: 01/19/2020 18:31   DG C-Arm 1-60 Min  Result Date: 03/01/2020 CLINICAL  DATA:  T7 kyphoplasty. EXAM: THORACIC SPINE 2 VIEWS; DG C-ARM 1-60 MIN COMPARISON:  Chest CT 01/19/2020 FINDINGS: Frontal and lateral fluoroscopic spot views of the thoracic spine obtained in the operating room. There is kyphoplasty material about midthoracic vertebra, level not delineated due to coned field of view. Reported fluoroscopy time 2 minutes 40 seconds. IMPRESSION: Fluoroscopic spot views after thoracic kyphoplasty. Electronically Signed   By: Keith Rake M.D.   On: 03/01/2020 14:06     ASSESSMENT & PLAN:  1. Small cell lung cancer ([place])   2. Encounter for antineoplastic immunotherapy   3. Memory loss   4. Thoracic compression fracture, closed, initial encounter ([place])   5. Depression, unspecified depression  type   6. Nausea without vomiting    #Extensive small cell lung cancer S/p 4 cycles of Carboplatin, Etoposide and Tecentriq.   Status post chest and whole brain radiation.  Overall patient is clinically doing well on maintenance Tecentriq. Labs are reviewed and discussed with patient We will proceed with Tecentriq maintenance today.  #Headache/nausea Patient last MRI brain was in December.  I will obtain MRI brain with and without contrast for further evaluation.  #Nausea, GERD Recommend patient continue use antiemetics as needed. I recommend patient to use omeprazole schedule daily instead of as needed.  #T7 compression fracture without evidence of underlying pathological osseous lesions. Status post kyphoplasty. Continue fentanyl patch 25 MCG per hour every 72 hours. She has Norco as needed.  Wean down pain medication..  #Osteopenia continue calcium and vitamin D supplementation. Bone metastasis.  Zometa on 03/04/2020.  Continue Zometa every 4 to 6 weeks.-I will hold off next Zometa due to recent kyphoplasty. 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 #Frequent falls,  continue physical therapy Continue Zoloft for depression.  continue follow-up with palliative care  service. Follow up in 3 weeks for eval with next cycle of tecentriq    Earlie Server, MD, PhD

## 2020-03-26 ENCOUNTER — Other Ambulatory Visit: Payer: Self-pay | Admitting: *Deleted

## 2020-03-26 MED ORDER — OMEPRAZOLE 40 MG PO CPDR: 40.0000 mg | DELAYED_RELEASE_CAPSULE | Freq: Every day | ORAL | 1 refills | Status: DC

## 2020-04-03 ENCOUNTER — Other Ambulatory Visit: Payer: Self-pay

## 2020-04-03 ENCOUNTER — Ambulatory Visit
Admission: RE | Admit: 2020-04-03 | Discharge: 2020-04-03 | Disposition: A | Discharge status: Home / Self Care | Payer: Medicare Other | Source: Ambulatory Visit | Attending: Radiation Oncology | Admitting: Radiation Oncology

## 2020-04-03 ENCOUNTER — Encounter: Payer: Self-pay | Admitting: Radiation Oncology

## 2020-04-03 VITALS — BP 90/72 | HR 91 | Temp 96.4°F | Resp 16 | Wt 144.4 lb

## 2020-04-03 DIAGNOSIS — C3402 Malignant neoplasm of left main bronchus: Secondary | ICD-10-CM

## 2020-04-03 DIAGNOSIS — F17210 Nicotine dependence, cigarettes, uncomplicated: Secondary | ICD-10-CM | POA: Diagnosis not present

## 2020-04-03 DIAGNOSIS — Z85118 Personal history of other malignant neoplasm of bronchus and lung: Secondary | ICD-10-CM | POA: Diagnosis not present

## 2020-04-03 NOTE — Progress Notes (Signed)
Radiation Oncology Follow up Note  Name: Kristi [name] [facility]   Date:   04/03/2020 MRN:  [id number] DOB: 05-16-1946    This 74 y.o. female presents to the clinic today for 83-month follow-up status post concurrent chemoradiation as well as PCI for extensive stage small cell lung cancer.  REFERRING PROVIDER: Glean Hess, MD  HPI: Patient is a 74 year old female now at 11 months having completed concurrent chemoradiation therapy for extensive stage small cell lung cancer.  EBUS bronchoscopy showed left mainstem bronchus involved with small cell lung cancer..  Her MRI of brain was negative.  She completed 4 cycles of Carbo/Etoposide/Tecentriq which she tolerated well.  She completed chest and whole brain radiation therapy in May 2020.  She has a known compression fracture of T7 her recent CT scan of the chest back in January shows severe compression fracture T7 she has had kyphoplasty at that region.  She has no evidence of thoracic progression of disease.  She is seen today is doing fairly well she specifically denies cough hemoptysis or chest tightness still having significant back pain although improved.  COMPLICATIONS OF TREATMENT: none  FOLLOW UP COMPLIANCE: keeps appointments   PHYSICAL EXAM:  BP 90/72 (BP Location: Left Arm, Patient Position: Sitting, Cuff Size: Normal)   Pulse 91   Temp (!) 96.4 F (35.8 C) (Tympanic)   Resp 16   Wt 144 lb 6.4 oz (65.5 kg)   BMI 22.62 kg/m  Frail-appearing female in NAD.  Well-developed well-nourished patient in NAD. HEENT reveals PERLA, EOMI, discs not visualized.  Oral cavity is clear. No oral mucosal lesions are identified. Neck is clear without evidence of cervical or supraclavicular adenopathy. Lungs are clear to A&P. Cardiac examination is essentially unremarkable with regular rate and rhythm without murmur rub or thrill. Abdomen is benign with no organomegaly or masses noted. Motor sensory and DTR levels are equal and symmetric in the upper  and lower extremities. Cranial nerves II through XII are grossly intact. Proprioception is intact. No peripheral adenopathy or edema is identified. No motor or sensory levels are noted. Crude visual fields are within normal range.  RADIOLOGY RESULTS: CT scans of chest abdomen pelvis reviewed compatible with above-stated findings  PLAN: Present time patient is doing well currently on maintenance Tecentriq.  She is scheduled for another MRI scan later this month.  CT scan showed excellent response to therapy.  I have asked to see her back in 6 months for follow-up and then will go to once year follow-up.  She continues treatment and management by medical oncology.  Patient knows to call with any concerns.  I would like to take this opportunity to thank you for allowing me to participate in the care of your patient.[name], MD

## 2020-04-08 NOTE — Progress Notes (Signed)
Pharmacist Chemotherapy Monitoring - Follow Up Assessment    I verify that I have reviewed each item in the below checklist:  . Regimen for the patient is scheduled for the appropriate day and plan matches scheduled date. [name] Kitchen Appropriate non-routine labs are ordered dependent on drug ordered. . If applicable, additional medications reviewed and ordered per protocol based on lifetime cumulative doses and/or treatment regimen.   Plan for follow-up and/or issues identified: Yes . I-vent associated with next due treatment: Yes MD and/or nursing notified: No Adelina Mings 04/08/2020 11:18 AM2

## 2020-04-10 ENCOUNTER — Telehealth: Payer: Self-pay

## 2020-04-10 NOTE — Telephone Encounter (Signed)
Telephone call to patient to schedule palliative care visit.  Patient didn't answer phone.  RN left message requesting call back to schedule admission time.

## 2020-04-11 ENCOUNTER — Telehealth: Payer: Self-pay

## 2020-04-11 NOTE — Telephone Encounter (Signed)
SW attempted to contact patient to schedule visit, no answer. SW left VM with contact information.

## 2020-04-12 ENCOUNTER — Ambulatory Visit
Admission: RE | Admit: 2020-04-12 | Discharge: 2020-04-12 | Disposition: A | Discharge status: Home / Self Care | Payer: Medicare Other | Source: Ambulatory Visit | Attending: Oncology | Admitting: Oncology

## 2020-04-12 ENCOUNTER — Other Ambulatory Visit: Payer: Self-pay

## 2020-04-12 DIAGNOSIS — C3490 Malignant neoplasm of unspecified part of unspecified bronchus or lung: Secondary | ICD-10-CM | POA: Diagnosis not present

## 2020-04-12 DIAGNOSIS — Z85118 Personal history of other malignant neoplasm of bronchus and lung: Secondary | ICD-10-CM | POA: Diagnosis not present

## 2020-04-12 MED ORDER — GADOBUTROL 1 MMOL/ML IV SOLN
6.0000 mL | Freq: Once | INTRAVENOUS | Status: AC | PRN
  Administered 2020-04-12: 6 mL via INTRAVENOUS

## 2020-04-15 ENCOUNTER — Telehealth: Payer: Self-pay

## 2020-04-15 ENCOUNTER — Inpatient Hospital Stay: Payer: Medicare Other

## 2020-04-15 ENCOUNTER — Inpatient Hospital Stay (HOSPITAL_BASED_OUTPATIENT_CLINIC_OR_DEPARTMENT_OTHER): Payer: Medicare Other | Admitting: Hospice and Palliative Medicine

## 2020-04-15 ENCOUNTER — Inpatient Hospital Stay (HOSPITAL_BASED_OUTPATIENT_CLINIC_OR_DEPARTMENT_OTHER): Payer: Medicare Other | Admitting: Oncology

## 2020-04-15 ENCOUNTER — Encounter: Payer: Self-pay | Admitting: Oncology

## 2020-04-15 ENCOUNTER — Other Ambulatory Visit: Payer: Self-pay

## 2020-04-15 VITALS — BP 90/67 | HR 97 | Temp 98.1°F | Resp 16 | Wt 142.4 lb

## 2020-04-15 DIAGNOSIS — C3490 Malignant neoplasm of unspecified part of unspecified bronchus or lung: Secondary | ICD-10-CM

## 2020-04-15 DIAGNOSIS — C7951 Secondary malignant neoplasm of bone: Secondary | ICD-10-CM | POA: Diagnosis not present

## 2020-04-15 DIAGNOSIS — R413 Other amnesia: Secondary | ICD-10-CM

## 2020-04-15 DIAGNOSIS — Z95828 Presence of other vascular implants and grafts: Secondary | ICD-10-CM

## 2020-04-15 DIAGNOSIS — Z5112 Encounter for antineoplastic immunotherapy: Secondary | ICD-10-CM

## 2020-04-15 DIAGNOSIS — Z515 Encounter for palliative care: Secondary | ICD-10-CM

## 2020-04-15 DIAGNOSIS — G893 Neoplasm related pain (acute) (chronic): Secondary | ICD-10-CM

## 2020-04-15 DIAGNOSIS — M8580 Other specified disorders of bone density and structure, unspecified site: Secondary | ICD-10-CM | POA: Diagnosis not present

## 2020-04-15 DIAGNOSIS — Z79899 Other long term (current) drug therapy: Secondary | ICD-10-CM | POA: Diagnosis not present

## 2020-04-15 DIAGNOSIS — R5383 Other fatigue: Secondary | ICD-10-CM

## 2020-04-15 DIAGNOSIS — E860 Dehydration: Secondary | ICD-10-CM

## 2020-04-15 DIAGNOSIS — R6889 Other general symptoms and signs: Secondary | ICD-10-CM

## 2020-04-15 DIAGNOSIS — C7931 Secondary malignant neoplasm of brain: Secondary | ICD-10-CM | POA: Diagnosis not present

## 2020-04-15 LAB — CBC WITH DIFFERENTIAL/PLATELET
Abs Immature Granulocytes: 0.04 10*3/uL (ref 0.00–0.07)
Basophils Absolute: 0.1 10*3/uL (ref 0.0–0.1)
Basophils Relative: 1 %
Eosinophils Absolute: 0.3 10*3/uL (ref 0.0–0.5)
Eosinophils Relative: 4 %
HCT: 36.2 % (ref 36.0–46.0)
Hemoglobin: 12.3 g/dL (ref 12.0–15.0)
Immature Granulocytes: 1 %
Lymphocytes Relative: 21 %
Lymphs Abs: 1.6 10*3/uL (ref 0.7–4.0)
MCH: 29.7 pg (ref 26.0–34.0)
MCHC: 34 g/dL (ref 30.0–36.0)
MCV: 87.4 fL (ref 80.0–100.0)
Monocytes Absolute: 0.9 10*3/uL (ref 0.1–1.0)
Monocytes Relative: 11 %
Neutro Abs: 4.9 10*3/uL (ref 1.7–7.7)
Neutrophils Relative %: 62 %
Platelets: 172 10*3/uL (ref 150–400)
RBC: 4.14 MIL/uL (ref 3.87–5.11)
RDW: 13.4 % (ref 11.5–15.5)
WBC: 7.8 10*3/uL (ref 4.0–10.5)
nRBC: 0 % (ref 0.0–0.2)

## 2020-04-15 LAB — COMPREHENSIVE METABOLIC PANEL
ALT: 12 U/L (ref 0–44)
AST: 13 U/L — ABNORMAL LOW (ref 15–41)
Albumin: 4.1 g/dL (ref 3.5–5.0)
Alkaline Phosphatase: 48 U/L (ref 38–126)
Anion gap: 11 (ref 5–15)
BUN: 20 mg/dL (ref 8–23)
CO2: 25 mmol/L (ref 22–32)
Calcium: 9.3 mg/dL (ref 8.9–10.3)
Chloride: 100 mmol/L (ref 98–111)
Creatinine, Ser: 0.8 mg/dL (ref 0.44–1.00)
GFR calc Af Amer: >60 mL/min (ref >60)
GFR calc non Af Amer: >60 mL/min (ref >60)
Glucose, Bld: 107 mg/dL — ABNORMAL HIGH (ref 70–99)
Potassium: 3.8 mmol/L (ref 3.5–5.1)
Sodium: 136 mmol/L (ref 135–145)
Total Bilirubin: 0.8 mg/dL (ref 0.3–1.2)
Total Protein: 7.4 g/dL (ref 6.5–8.1)

## 2020-04-15 LAB — VITAMIN B12: Vitamin B-12: 1452 pg/mL — ABNORMAL HIGH (ref 180–914)

## 2020-04-15 LAB — CORTISOL: Cortisol, Plasma: 6.7 ug/dL

## 2020-04-15 MED ORDER — HEPARIN SOD (PORK) LOCK FLUSH 100 UNIT/ML IV SOLN
INTRAVENOUS | Status: AC
  Filled 2020-04-15: qty 5

## 2020-04-15 MED ORDER — SODIUM CHLORIDE 0.9 % IV SOLN
Freq: Once | INTRAVENOUS | Status: AC
  Filled 2020-04-15: qty 250

## 2020-04-15 MED ORDER — HEPARIN SOD (PORK) LOCK FLUSH 100 UNIT/ML IV SOLN
500.0000 [IU] | Freq: Once | INTRAVENOUS | Status: AC
  Administered 2020-04-15: 500 [IU] via INTRAVENOUS
  Filled 2020-04-15: qty 5

## 2020-04-15 MED ORDER — SODIUM CHLORIDE 0.9 % IV SOLN
1200.0000 mg | Freq: Once | INTRAVENOUS | Status: AC
  Administered 2020-04-15: 1200 mg via INTRAVENOUS
  Filled 2020-04-15: qty 20

## 2020-04-15 MED ORDER — DEXAMETHASONE SODIUM PHOSPHATE 10 MG/ML IJ SOLN
10.0000 mg | Freq: Once | INTRAMUSCULAR | Status: AC
  Administered 2020-04-15: 10 mg via INTRAVENOUS
  Filled 2020-04-15: qty 1

## 2020-04-15 MED ORDER — ZOLEDRONIC ACID 4 MG/100ML IV SOLN
4.0000 mg | Freq: Once | INTRAVENOUS | Status: AC
  Administered 2020-04-15: 4 mg via INTRAVENOUS
  Filled 2020-04-15: qty 100

## 2020-04-15 MED ORDER — HEPARIN SOD (PORK) LOCK FLUSH 100 UNIT/ML IV SOLN
500.0000 [IU] | Freq: Once | INTRAVENOUS | Status: DC | PRN
  Filled 2020-04-15: qty 5

## 2020-04-15 MED ORDER — SODIUM CHLORIDE 0.9% FLUSH
10.0000 mL | Freq: Once | INTRAVENOUS | Status: AC
  Administered 2020-04-15: 10 mL via INTRAVENOUS
  Filled 2020-04-15: qty 10

## 2020-04-15 MED ORDER — LIDOCAINE-PRILOCAINE 2.5-2.5 % EX CREA: 1.0000 "application " | TOPICAL_CREAM | Freq: Every day | CUTANEOUS | 2 refills | Status: DC | PRN

## 2020-04-15 NOTE — Progress Notes (Signed)
Hematology/Oncology Follow up note [facility] Telephone:([phone] Fax:([phone]) [phone]   Patient Care Team: Glean Hess, MD as PCP - General (Internal Medicine) Charolette Forward, MD as Consulting Physician (Cardiology) Telford Nab, RN as Registered Nurse [name], MD as Radiation Oncologist (Radiation Oncology)  REFERRING PROVIDER: Dr. Felicie Morn REASON FOR VISIT:  Follow-up for small cell lung cancer,   HISTORY OF PRESENTING ILLNESS:  Kristi [name] is a  74 y.o.  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  10/20/2018 CT chest with contrast showed large mediastinal mass involving both hilar, left greater than right, consistent with lung carcinoma, The mass causes narrowing of the left mainstem bronchus with resultant volume loss on the left and a mediastinal shift to the left.  Moderate size left pleural effusion. 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  # MRI brain negative.  # Nov 2019- Jan 2020 s/p 4 cycles of Carbo/Etoposide/Tecentriq #  01/24/2019 interim CT scan done which showed continued positive response to therapy with continued reduction in mediastinal adenopathy.  No residual measurable left lung mass. CT findings of acute emphysematous cystitis. Urology did not feel that patient has pyelonephritis and recommend treatment with antibiotics because patient's immunocompromise. Patient finished treatment.   # Chest and whole brain radiation finished in May 2020 # 04/25/2019 resume Tecentriq every 3 weeks. # Patient had a fall from her stairs on 01/19/2020. In the emergency room she had CT chest abdomen pelvis CT, CT head without contrast, CT cervical spine without contrast. No CT evidence for acute intracranial abnormality, degenerative changes of cervical spine..  No CT evidence of acute thoracic abdomen  injury.  Severe compression fracture of T7, subacute.  # kyphoplasty procedure on 03/01/2020.  T7 biopsy negative for cancer.  INTERVAL HISTORY Kristi [name] is a 74 y.o. female who has above history reviewed by me today presents for evaluation prior to immunotherapy for treatment of extensive small cell lung cancer. Patient is on immunotherapy maintenance. She feels fatigue, otherwise no new complaints.  Chronic forgetfulness.  occasional nausea, antiemetics help.  BP is 90/67 HR 97, denies any dizziness or lightheaded.  Review of Systems  Constitutional: Positive for fatigue. Negative for appetite change, chills and fever.  HENT:   Negative for hearing loss and voice change.   Eyes: Negative for eye problems.  Respiratory: Negative for chest tightness, cough and shortness of breath.   Cardiovascular: Negative for chest pain.  Gastrointestinal: Positive for nausea. Negative for abdominal distention, abdominal pain, blood in stool and diarrhea.  Endocrine: Negative for hot flashes.  Genitourinary: Negative for difficulty urinating and frequency.   Musculoskeletal: Positive for back pain. Negative for arthralgias.  Skin: Negative for itching and rash.  Neurological: Negative for extremity weakness.  Hematological: Negative for adenopathy.  Psychiatric/Behavioral: Negative for confusion and depression. The patient is not nervous/anxious.        Forgetful     MEDICAL HISTORY:  Past Medical History:  Diagnosis Date  . Acute MI, inferoposterior wall ([place]) 09/30/2014  . Claustrophobia   . Coronary artery disease   . GERD (gastroesophageal reflux disease)   . Hypercholesteremia   . Hypertension   . MI, old   . Pneumonia   . 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 [facility]) 10/27/2018    SURGICAL HISTORY: Past Surgical History:  Procedure Laterality Date  . APPENDECTOMY     benign tumor  on liver found  . BLADDER NECK SUSPENSION    . CHOLECYSTECTOMY N/A 08/14/2018   Procedure:  LAPAROSCOPIC CHOLECYSTECTOMY;  Surgeon: Jules Husbands, MD;  Location: [facility] ORS;  Service: General;  Laterality: N/A;  . CHOLECYSTECTOMY  11/2018  . CORONARY ANGIOPLASTY  09/29/2014  . CORONARY STENT PLACEMENT    . ENDOBRONCHIAL ULTRASOUND N/A 10/21/2018   Procedure: ENDOBRONCHIAL ULTRASOUND;  Surgeon: Laverle Hobby, MD;  Location: [facility] ORS;  Service: Pulmonary;  Laterality: N/A;  . HERNIA REPAIR    . IR FLUORO GUIDE CV LINE RIGHT  12/19/2018  . KYPHOPLASTY N/A 03/01/2020   Procedure: T7 KYPHOPLASTY;  Surgeon: Hessie Knows, MD;  Location: [facility] ORS;  Service: Orthopedics;  Laterality: N/A;  . LEFT HEART CATHETERIZATION WITH CORONARY ANGIOGRAM N/A 09/29/2014   Procedure: LEFT HEART CATHETERIZATION WITH CORONARY ANGIOGRAM;  Surgeon: Clent Demark, MD;  Location: [place] CATH LAB;  Service: Cardiovascular;  Laterality: N/A;  . PORTA CATH INSERTION N/A 01/02/2019   Procedure: PORTA CATH INSERTION;  Surgeon: Algernon Huxley, MD;  Location: [place] CV LAB;  Service: Cardiovascular;  Laterality: N/A;  . PORTACATH PLACEMENT Right 10/28/2018   Procedure: INSERTION PORT-A-CATH;  Surgeon: Jules Husbands, MD;  Location: [facility] ORS;  Service: General;  Laterality: Right;    SOCIAL HISTORY: Social History   Socioeconomic History  . Marital status: Married    Spouse name: [name]   . Number of children: 3  . Years of education: Not on file  . Highest education level: Not on file  Occupational History  . Occupation: Retired    Comment: Chief Technology Officer   Tobacco Use  . Smoking status: Former Smoker    Packs/day: 1.00    Years: 39.00    Pack years: 39.00    Types: Cigarettes    Start date: 12/21/1978    Quit date: 10/16/2018    Years since quitting: 1.4  . Smokeless tobacco: Never Used  Substance and Sexual Activity  . Alcohol use: Yes    Alcohol/week: 0.0 standard drinks    Comment: occassional - approx 1 every 2 weeks   . Drug use: No  . Sexual activity: Yes    Birth control/protection: None   Other Topics Concern  . Not on file  Social History Narrative  . Not on file   Social Determinants of Health   Financial Resource Strain:   . Difficulty of Paying Living Expenses:   Food Insecurity:   . Worried About Charity fundraiser in the Last Year:   . Arboriculturist in the Last Year:   Transportation Needs:   . Film/video editor (Medical):   [name] Kitchen Lack of Transportation (Non-Medical):   Physical Activity:   . Days of Exercise per Week:   . Minutes of Exercise per Session:   Stress:   . Feeling of Stress :   Social Connections:   . Frequency of Communication with Friends and Family:   . Frequency of Social Gatherings with Friends and Family:   . Attends Religious Services:   . Active Member of Clubs or Organizations:   . Attends Archivist Meetings:   [name] Kitchen Marital Status:   Intimate Partner Violence:   . Fear of Current or Ex-Partner:   . Emotionally Abused:   [name] Kitchen Physically Abused:   . Sexually Abused:     FAMILY HISTORY: Family History  Problem Relation Age of Onset  . Alzheimer's disease Mother   . Colon cancer Father   . Breast cancer  Neg Hx     ALLERGIES:  has No Known Allergies.  MEDICATIONS:  Current Outpatient Medications  Medication Sig Dispense Refill  . acetaminophen (TYLENOL) 500 MG tablet Take 500 mg by mouth 2 (two) times daily as needed for moderate pain or headache.    Huey Bienenstock (TECENTRIQ IV) Inject 1 Dose into the vein every 30 (thirty) days.    . Cholecalciferol (DIALYVITE VITAMIN D 5000) 125 MCG (5000 UT) capsule Take 5,000 Units by mouth daily.    . diphenhydrAMINE-zinc acetate (BENADRYL EXTRA STRENGTH) cream Apply 1 application topically 3 (three) times daily as needed for itching. 28.4 g 0  . fentaNYL (DURAGESIC) 25 MCG/HR Place 1 patch onto the skin every 3 (three) days. 10 patch 0  . HYDROcodone-acetaminophen (NORCO/VICODIN) 5-325 MG tablet Take 1 tablet by mouth every 6 (six) hours as needed for moderate pain. 90 tablet  0  . nitroGLYCERIN (NITROSTAT) 0.4 MG SL tablet Place 1 tablet (0.4 mg total) under the tongue every 5 (five) minutes x 3 doses as needed for chest pain. 25 tablet 12  . omeprazole (PRILOSEC) 40 MG capsule Take 1 capsule (40 mg total) by mouth daily. 90 capsule 1  . sertraline (ZOLOFT) 50 MG tablet Take 1.5 tablets (75 mg total) by mouth daily. 45 tablet 1  . vitamin B-12 (CYANOCOBALAMIN) 1000 MCG tablet Take 1,000 mcg by mouth daily.    . Zoledronic Acid (ZOMETA IV) Inject 1 Dose into the vein every 30 (thirty) days.    [name] Kitchen lidocaine-prilocaine (EMLA) cream Apply 1 application topically daily as needed (port access). 30 g 2  . loperamide (IMODIUM A-D) 2 MG tablet Take 2 mg by mouth 4 (four) times daily as needed for diarrhea or loose stools.    . polyethylene glycol (MIRALAX / GLYCOLAX) packet Take 17 g by mouth daily as needed for mild constipation.      No current facility-administered medications for this visit.   Facility-Administered Medications Ordered in Other Visits  Medication Dose Route Frequency Provider Last Rate Last Admin  . atezolizumab (TECENTRIQ) 1,200 mg in sodium chloride 0.9 % 250 mL chemo infusion  1,200 mg Intravenous Once Earlie Server, MD      . dexamethasone (DECADRON) injection 10 mg  10 mg Intravenous Once Earlie Server, MD      . heparin lock flush 100 unit/mL  500 Units Intravenous Once Earlie Server, MD      . heparin lock flush 100 unit/mL  500 Units Intravenous Once Earlie Server, MD      . heparin lock flush 100 unit/mL  500 Units Intravenous Once Earlie Server, MD      . heparin lock flush 100 unit/mL  500 Units Intracatheter Once PRN Earlie Server, MD      . sodium chloride flush (NS) 0.9 % injection 10 mL  10 mL Intravenous PRN Earlie Server, MD   10 mL at 01/09/19 0820  . sodium chloride flush (NS) 0.9 % injection 10 mL  10 mL Intravenous PRN Earlie Server, MD   10 mL at 01/10/19 1400  . Zoledronic Acid (ZOMETA) IVPB 4 mg  4 mg Intravenous Once Earlie Server, MD 400 mL/hr at 04/15/20 1052 4 mg at  04/15/20 1052     PHYSICAL EXAMINATION: ECOG PERFORMANCE STATUS: 1 - Symptomatic but completely ambulatory Vitals:   04/15/20 0921  BP: 90/67  Pulse: 97  Resp: 16  Temp: 98.1 F (36.7 C)   Filed Weights   04/15/20 0921  Weight: 142 lb 6.4 oz (64.6  kg)   Physical Exam  Constitutional: She is oriented to person, place, and time. No distress.  HENT:  Head: Normocephalic and atraumatic.  Mouth/Throat: No oropharyngeal exudate.  Eyes: Pupils are equal, round, and reactive to light. EOM are normal. No scleral icterus.  Cardiovascular: Normal rate and regular rhythm.  No murmur heard. Pulmonary/Chest: Effort normal. No respiratory distress. She has no rales. She exhibits no tenderness.  Decreased breath sounds bilaterally, right side breath sound is more diminished.   Abdominal: Soft. She exhibits no distension. There is no abdominal tenderness.  Musculoskeletal:        General: No edema. Normal range of motion.     Cervical back: Normal range of motion and neck supple.  Neurological: She is alert and oriented to person, place, and time.  Skin: Skin is warm and dry. She is not diaphoretic. No erythema.  Psychiatric: Affect normal.       LABORATORY DATA:  I have reviewed the data as listed Lab Results  Component Value Date   WBC 7.8 04/15/2020   HGB 12.3 04/15/2020   HCT 36.2 04/15/2020   MCV 87.4 04/15/2020   PLT 172 04/15/2020   Recent Labs    03/04/20 0827 03/25/20 0909 04/15/20 0858  NA 131* 137 136  K 3.8 3.6 3.8  CL 102 104 100  CO2 22 23 25   GLUCOSE 111* 108* 107*  BUN 14 18 20   CREATININE 0.89 0.81 0.80  CALCIUM 8.8* 9.5 9.3  GFRNONAA >60 >60 >60  GFRAA >60 >60 >60  PROT 7.0 7.5 7.4  ALBUMIN 4.1 4.2 4.1  AST 15 13* 13*  ALT 11 13 12   ALKPHOS 47 42 48  BILITOT 0.8 0.6 0.8    RADIOGRAPHIC STUDIES: I have personally reviewed the radiological images as listed and agreed with the findings in the report.  DG Thoracic Spine 2 View  Result Date:  03/01/2020 CLINICAL DATA:  T7 kyphoplasty. EXAM: THORACIC SPINE 2 VIEWS; DG C-ARM 1-60 MIN COMPARISON:  Chest CT 01/19/2020 FINDINGS: Frontal and lateral fluoroscopic spot views of the thoracic spine obtained in the operating room. There is kyphoplasty material about midthoracic vertebra, level not delineated due to coned field of view. Reported fluoroscopy time 2 minutes 40 seconds. IMPRESSION: Fluoroscopic spot views after thoracic kyphoplasty. Electronically Signed   By: Keith Rake M.D.   On: 03/01/2020 14:06   DG Wrist Complete Left  Result Date: 01/19/2020 CLINICAL DATA:  [place] down 5 stairs 2 days ago with left wrist pain. EXAM: LEFT WRIST - COMPLETE 3+ VIEW COMPARISON:  None. FINDINGS: Mild degenerate change over the radiocarpal joint and first carpometacarpal joints. Mild osteopenia is present. No evidence of acute fracture or dislocation. IMPRESSION: No acute fracture. Electronically Signed   By: Marin Olp M.D.   On: 01/19/2020 15:04   CT Head Wo Contrast  Result Date: 01/19/2020 CLINICAL DATA:  [place] [address] down stairs bruising to the left forehead puncture wound EXAM: CT HEAD WITHOUT CONTRAST CT CERVICAL SPINE WITHOUT CONTRAST TECHNIQUE: Multidetector CT imaging of the head and cervical spine was performed following the standard protocol without intravenous contrast. Multiplanar CT image reconstructions of the cervical spine were also generated. COMPARISON:  MRI 12/01/2019, CT brain 07/27/2016 FINDINGS: CT HEAD FINDINGS Brain: No acute territorial infarction, hemorrhage or intracranial mass is visualized. Extensive white matter hypodensity throughout the brain, felt consistent with history of whole brain radiation. Mild atrophy. Stable ventricle size Vascular: No hyperdense vessels.  Carotid vascular calcification Skull: Normal. Negative for fracture or focal  lesion. Sinuses/Orbits: No acute finding. Other: Small forehead scalp hematoma CT CERVICAL SPINE FINDINGS Alignment: No subluxation.  Facet  alignment is normal. Skull base and vertebrae: No acute fracture. No primary bone lesion or focal pathologic process. Soft tissues and spinal canal: No prevertebral fluid or swelling. No visible canal hematoma. Disc levels: Advanced degenerative changes C4-C5, C5-C6 and C6-C7. Multiple level facet degenerative change. Upper chest: Emphysema Other: None IMPRESSION: 1. No CT evidence for acute intracranial abnormality. 2. Extensive white matter disease, felt consistent with history of whole brain radiation. 3. Degenerative changes of the cervical spine. No acute osseous abnormality 4. Emphysema Electronically Signed   By: Donavan Foil M.D.   On: 01/19/2020 18:25   CT Chest W Contrast  Result Date: 01/19/2020 CLINICAL DATA:  [place] down concrete steps 2 days ago. Blunt chest and abdominal trauma, with pain and tenderness. Initial encounter. EXAM: CT CHEST, ABDOMEN, AND PELVIS WITH CONTRAST TECHNIQUE: Multidetector CT imaging of the chest, abdomen and pelvis was performed following the standard protocol during bolus administration of intravenous contrast. CONTRAST:  180mL OMNIPAQUE IOHEXOL 300 MG/ML  SOLN COMPARISON:  10/23/2019 FINDINGS: CT CHEST FINDINGS Cardiovascular: No evidence of thoracic aortic injury or mediastinal hematoma. No pericardial effusion. Aortic and coronary artery atherosclerosis incidentally noted. Mediastinum/Nodes: No evidence of pneumomediastinum. No masses or pathologically enlarged lymph nodes identified. Lungs/Pleura: No evidence of pulmonary contusion or other infiltrate. Bibasilar atelectasis or scarring noted. No evidence of pneumothorax or hemothorax. Musculoskeletal: A severe compression fracture of the T7 vertebral body is seen. This was not seen on previous study approximately 3 months ago, but shows sclerosis indicating that it is subacute in age. CT ABDOMEN PELVIS FINDINGS Hepatobiliary: No hepatic laceration or mass identified. Prior cholecystectomy. No evidence of biliary  obstruction. Pancreas: No parenchymal laceration, mass, or inflammatory changes identified. Spleen: No evidence of splenic laceration. Adrenal/Urinary Tract: No hemorrhage or parenchymal lacerations identified. No evidence of mass or hydronephrosis. Stomach/Bowel: No evidence of bowel wall thickening or dilatation. No evidence of hemoperitoneum. Diverticulosis is seen mainly involving the sigmoid colon, however there is no evidence of diverticulitis. A moderate ventral abdominal wall hernia is again seen containing the multiple small bowel loops. No evidence of small bowel obstruction or strangulation. Vascular/Lymphatic: No evidence of abdominal aortic injury or retroperitoneal hemorrhage. Aortic atherosclerosis incidentally noted. No pathologically enlarged lymph nodes identified. Reproductive:  No mass or other significant abnormality identified. Other:  None. Musculoskeletal: No acute fractures or suspicious bone lesions identified. IMPRESSION: 1. Severe compression fracture of T7 vertebral body, likely subacute in age. 2. No evidence of thoracic aortic injury or mediastinal hematoma. 3. No evidence of abdominal organ injury or hemoperitoneum. 4. Moderate ventral abdominal wall hernia containing multiple small bowel loops. 5. Colonic diverticulosis, without radiographic evidence of diverticulitis. 6. Aortic and coronary artery atherosclerosis. Electronically Signed   By: Marlaine Hind M.D.   On: 01/19/2020 18:31   CT Cervical Spine Wo Contrast  Result Date: 01/19/2020 CLINICAL DATA:  [place] [address] down stairs bruising to the left forehead puncture wound EXAM: CT HEAD WITHOUT CONTRAST CT CERVICAL SPINE WITHOUT CONTRAST TECHNIQUE: Multidetector CT imaging of the head and cervical spine was performed following the standard protocol without intravenous contrast. Multiplanar CT image reconstructions of the cervical spine were also generated. COMPARISON:  MRI 12/01/2019, CT brain 07/27/2016 FINDINGS: CT HEAD FINDINGS Brain:  No acute territorial infarction, hemorrhage or intracranial mass is visualized. Extensive white matter hypodensity throughout the brain, felt consistent with history of whole brain radiation. Mild atrophy. Stable ventricle  size Vascular: No hyperdense vessels.  Carotid vascular calcification Skull: Normal. Negative for fracture or focal lesion. Sinuses/Orbits: No acute finding. Other: Small forehead scalp hematoma CT CERVICAL SPINE FINDINGS Alignment: No subluxation.  Facet alignment is normal. Skull base and vertebrae: No acute fracture. No primary bone lesion or focal pathologic process. Soft tissues and spinal canal: No prevertebral fluid or swelling. No visible canal hematoma. Disc levels: Advanced degenerative changes C4-C5, C5-C6 and C6-C7. Multiple level facet degenerative change. Upper chest: Emphysema Other: None IMPRESSION: 1. No CT evidence for acute intracranial abnormality. 2. Extensive white matter disease, felt consistent with history of whole brain radiation. 3. Degenerative changes of the cervical spine. No acute osseous abnormality 4. Emphysema Electronically Signed   By: Donavan Foil M.D.   On: 01/19/2020 18:25   MR Brain W Wo Contrast  Result Date: 04/13/2020 CLINICAL DATA:  74 year old female with history of small cell lung cancer status post prophylactic cranial radiation in May and June 2020. Restaging. EXAM: MRI HEAD WITHOUT AND WITH CONTRAST TECHNIQUE: Multiplanar, multiecho pulse sequences of the brain and surrounding structures were obtained without and with intravenous contrast. CONTRAST:  73mL GADAVIST GADOBUTROL 1 MMOL/ML IV SOLN COMPARISON:  Brain MRI 12/01/2019 and earlier. FINDINGS: Brain: No abnormal enhancement identified. No midline shift, mass effect, or evidence of intracranial mass lesion. No dural thickening. No precontrast sagittal T1 imaging is provided today. No restricted diffusion to suggest acute infarction. No ventriculomegaly, extra-axial collection or acute  intracranial hemorrhage. Cervicomedullary junction and pituitary are within normal limits. Confluent bilateral cerebral white matter T2 and FLAIR hyperintensity compatible with whole brain radiation appears stable. Stable superimposed patchy T2 heterogeneity in the bilateral deep gray nuclei and pons. No cortical encephalomalacia or chronic cerebral blood products identified. Vascular: Major intracranial vascular flow voids are stable since 2019, with chronically decreased distal left vertebral artery flow void. The major dural venous sinuses are enhancing and appear to be patent. Skull and upper cervical spine: Stable and negative visible cervical spine. No suspicious marrow signal identified. Sinuses/Orbits: Negative orbits. Paranasal sinuses and mastoids remain well pneumatized. Other: Visible internal auditory structures appear normal. Scalp and face soft tissues appear negative. IMPRESSION: 1. No metastatic disease or acute intracranial abnormality. 2. 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 Electronically Signed   By: Genevie Ann M.D.   On: 04/13/2020 12:58   CT Abdomen Pelvis W Contrast  Result Date: 01/19/2020 CLINICAL DATA:  [place] down concrete steps 2 days ago. Blunt chest and abdominal trauma, with pain and tenderness. Initial encounter. EXAM: CT CHEST, ABDOMEN, AND PELVIS WITH CONTRAST TECHNIQUE: Multidetector CT imaging of the chest, abdomen and pelvis was performed following the standard protocol during bolus administration of intravenous contrast. CONTRAST:  126mL OMNIPAQUE IOHEXOL 300 MG/ML  SOLN COMPARISON:  10/23/2019 FINDINGS: CT CHEST FINDINGS Cardiovascular: No evidence of thoracic aortic injury or mediastinal hematoma. No pericardial effusion. Aortic and coronary artery atherosclerosis incidentally noted. Mediastinum/Nodes: No evidence of pneumomediastinum. No masses or  pathologically enlarged lymph nodes identified. Lungs/Pleura: No evidence of pulmonary contusion or other infiltrate. Bibasilar atelectasis or scarring noted. No evidence of pneumothorax or hemothorax. Musculoskeletal: A severe compression fracture of the T7 vertebral body is seen. This was not seen on previous study approximately 3 months ago, but shows sclerosis indicating that it is subacute in age. CT ABDOMEN PELVIS FINDINGS Hepatobiliary: No hepatic laceration or mass identified. Prior cholecystectomy. No evidence of biliary  obstruction. Pancreas: No parenchymal laceration, mass, or inflammatory changes identified. Spleen: No evidence of splenic laceration. Adrenal/Urinary Tract: No hemorrhage or parenchymal lacerations identified. No evidence of mass or hydronephrosis. Stomach/Bowel: No evidence of bowel wall thickening or dilatation. No evidence of hemoperitoneum. Diverticulosis is seen mainly involving the sigmoid colon, however there is no evidence of diverticulitis. A moderate ventral abdominal wall hernia is again seen containing the multiple small bowel loops. No evidence of small bowel obstruction or strangulation. Vascular/Lymphatic: No evidence of abdominal aortic injury or retroperitoneal hemorrhage. Aortic atherosclerosis incidentally noted. No pathologically enlarged lymph nodes identified. Reproductive:  No mass or other significant abnormality identified. Other:  None. Musculoskeletal: No acute fractures or suspicious bone lesions identified. IMPRESSION: 1. Severe compression fracture of T7 vertebral body, likely subacute in age. 2. No evidence of thoracic aortic injury or mediastinal hematoma. 3. No evidence of abdominal organ injury or hemoperitoneum. 4. Moderate ventral abdominal wall hernia containing multiple small bowel loops. 5. Colonic diverticulosis, without radiographic evidence of diverticulitis. 6. Aortic and coronary artery atherosclerosis. Electronically Signed   By: Marlaine Hind  M.D.   On: 01/19/2020 18:31   DG C-Arm 1-60 Min  Result Date: 03/01/2020 CLINICAL DATA:  T7 kyphoplasty. EXAM: THORACIC SPINE 2 VIEWS; DG C-ARM 1-60 MIN COMPARISON:  Chest CT 01/19/2020 FINDINGS: Frontal and lateral fluoroscopic spot views of the thoracic spine obtained in the operating room. There is kyphoplasty material about midthoracic vertebra, level not delineated due to coned field of view. Reported fluoroscopy time 2 minutes 40 seconds. IMPRESSION: Fluoroscopic spot views after thoracic kyphoplasty. Electronically Signed   By: Keith Rake M.D.   On: 03/01/2020 14:06     ASSESSMENT & PLAN:  1. Small cell lung cancer ([place])   2. Memory loss   3. Osteopenia, unspecified location   4. Encounter for antineoplastic immunotherapy    #Extensive small cell lung cancer S/p 4 cycles of Carboplatin, Etoposide and Tecentriq.   Status post chest and whole brain radiation.  Overall patient is clinically doing well on maintenance Tecentriq. Labs are reviewed and discussed with patient. Proceed with today's Tecentriq treatment.  MRI brain showed no CNS metastatic disease, chronic changes. Discussed with patient and husband.  Repeat CT chest abdomen pelvis with contrast for surveillance.   #Osteopenia continue calcium and vitamin D supplementation. Bone metastasis.  She has been on Zometa Q6 weeks and will proceed one dose today.   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 #Frequent falls,  continue physical therapy  Follow up in 3 weeks for eval with next cycle of tecentriq    Earlie Server, MD, PhD

## 2020-04-15 NOTE — Progress Notes (Signed)
Patient reports occasional nausea after eating and she does have medication that relieves the nausea.

## 2020-04-15 NOTE — Progress Notes (Signed)
[place]  Telephone:([phone] [phone]   Name: Parisa Pinela [facility] Date: 04/15/2020 MRN: [id number]  DOB: Apr 04, 1946  Patient Care Team: Glean Hess, MD as PCP - General (Internal Medicine) Charolette Forward, MD as Consulting Physician (Cardiology) Telford Nab, RN as Registered Nurse [name], MD as Radiation Oncologist (Radiation Oncology)    REASON FOR CONSULTATION: Mrs. Laureen Frederic is a 74 y.o. 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  PMH also notable for CAD with history of acute MI, claustrophobia, hypertension, and hyperlipidemia.    Patient status post chemo on maintenance immunotherapy.  Palliative care was asked to help address goals and symptoms.  SOCIAL HISTORY:    Patient is married and lives at home with her husband of 65 years.  They moved here from [place], [place] around 4 years ago.  Patient has 2 sons and a daughter.  One son lives in [place] and the other 2 children are in [place].  Patient used to work for a [employer] and then later for the local [employer].  ADVANCE DIRECTIVES:  Not on file  CODE STATUS: DNR (MOST form completed on 11/24/18)  PAST MEDICAL HISTORY: Past Medical History:  Diagnosis Date  . Acute MI, inferoposterior wall ([place]) 09/30/2014  . Claustrophobia   . Coronary artery disease   . GERD (gastroesophageal reflux disease)   . Hypercholesteremia   . Hypertension   . MI, old   . Pneumonia   . 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 [facility]) 10/27/2018    PAST SURGICAL HISTORY:  Past Surgical History:  Procedure Laterality Date  . APPENDECTOMY     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  . BLADDER NECK SUSPENSION    . CHOLECYSTECTOMY N/A 08/14/2018   Procedure: LAPAROSCOPIC CHOLECYSTECTOMY;  Surgeon: Jules Husbands, MD;  Location: [facility] ORS;  Service: General;   Laterality: N/A;  . CHOLECYSTECTOMY  11/2018  . CORONARY ANGIOPLASTY  09/29/2014  . CORONARY STENT PLACEMENT    . ENDOBRONCHIAL ULTRASOUND N/A 10/21/2018   Procedure: ENDOBRONCHIAL ULTRASOUND;  Surgeon: Laverle Hobby, MD;  Location: [facility] ORS;  Service: Pulmonary;  Laterality: N/A;  . HERNIA REPAIR    . IR FLUORO GUIDE CV LINE RIGHT  12/19/2018  . KYPHOPLASTY N/A 03/01/2020   Procedure: T7 KYPHOPLASTY;  Surgeon: Hessie Knows, MD;  Location: [facility] ORS;  Service: Orthopedics;  Laterality: N/A;  . LEFT HEART CATHETERIZATION WITH CORONARY ANGIOGRAM N/A 09/29/2014   Procedure: LEFT HEART CATHETERIZATION WITH CORONARY ANGIOGRAM;  Surgeon: Clent Demark, MD;  Location: [place] CATH LAB;  Service: Cardiovascular;  Laterality: N/A;  . PORTA CATH INSERTION N/A 01/02/2019   Procedure: PORTA CATH INSERTION;  Surgeon: Algernon Huxley, MD;  Location: [place] CV LAB;  Service: Cardiovascular;  Laterality: N/A;  . PORTACATH PLACEMENT Right 10/28/2018   Procedure: INSERTION PORT-A-CATH;  Surgeon: Jules Husbands, MD;  Location: [facility] ORS;  Service: General;  Laterality: Right;    HEMATOLOGY/ONCOLOGY HISTORY:  Oncology History Overview Note  ZAKAYLA MARTINEC is a  74 y.o.  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   10/20/2018 CT chest with contrast showed large mediastinal mass involving both hilar, left greater than right, consistent with lung carcinoma, The mass causes narrowing of the left mainstem bronchus with resultant volume loss on the left and a mediastinal shift to the  left.  Moderate size left pleural effusion. 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   # Nov 2019- Jan 2020 s/p 4 cycles of Carbo/Etoposide/Tecentriq #  01/24/2019 interim CT scan done which showed continued positive response to therapy with continued reduction in mediastinal adenopathy.  No  residual measurable left lung mass. CT findings of acute emphysematous cystitis. Urology did not feel that patient has pyelonephritis and recommend treatment with antibiotics because patient's immunocompromise. Patient finished treatment.    # Chest and whole brain radiation finished in May 2020 # 04/25/2019 resume Tecentriq every 3 weeks.   Small cell lung cancer ([place])  10/27/2018 Initial Diagnosis   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 [facility])   11/02/2018 -  Chemotherapy   The patient had palonosetron (ALOXI) injection 0.25 mg, 0.25 mg, Intravenous,  Once, 4 of 4 cycles Administration: 0.25 mg (11/02/2018), 0.25 mg (11/23/2018), 0.25 mg (12/19/2018), 0.25 mg (01/09/2019) pegfilgrastim-cbqv (UDENYCA) injection 6 mg, 6 mg, Subcutaneous, Once, 4 of 4 cycles Administration: 6 mg (11/07/2018), 6 mg (11/28/2018), 6 mg (12/23/2018), 6 mg (01/12/2019) CARBOplatin (PARAPLATIN) 390 mg in sodium chloride 0.9 % 250 mL chemo infusion, 390 mg (100 % of original dose 394.5 mg), Intravenous,  Once, 4 of 4 cycles Dose modification:   (original dose 394.5 mg, Cycle 1) Administration: 390 mg (11/02/2018), 390 mg (11/23/2018), 390 mg (12/19/2018), 390 mg (01/09/2019) etoposide (VEPESID) 180 mg in sodium chloride 0.9 % 500 mL chemo infusion, 100 mg/m2 = 180 mg, Intravenous,  Once, 4 of 4 cycles Administration: 180 mg (11/02/2018), 180 mg (11/03/2018), 180 mg (11/04/2018), 180 mg (11/23/2018), 180 mg (11/24/2018), 180 mg (11/25/2018), 180 mg (12/19/2018), 180 mg (12/20/2018), 180 mg (12/22/2018), 180 mg (01/09/2019), 180 mg (01/10/2019), 180 mg (01/11/2019) fosaprepitant (EMEND) 150 mg, dexamethasone (DECADRON) 12 mg in sodium chloride 0.9 % 145 mL IVPB, , Intravenous,  Once, 4 of 4 cycles Administration:  (11/02/2018),  (11/23/2018),  (12/19/2018),  (01/09/2019) atezolizumab (TECENTRIQ) 1,200 mg in sodium chloride 0.9 % 250 mL chemo infusion, 1,200 mg, Intravenous, Once, 19 of 28 cycles Administration: 1,200 mg (11/23/2018), 1,200 mg  (12/19/2018), 1,200 mg (01/09/2019), 1,200 mg (04/24/2019), 1,200 mg (05/18/2019), 1,200 mg (06/15/2019), 1,200 mg (07/13/2019), 1,200 mg (08/03/2019), 1,200 mg (08/24/2019), 1,200 mg (09/14/2019), 1,200 mg (10/05/2019), 1,200 mg (10/26/2019), 1,200 mg (01/01/2020), 1,200 mg (12/11/2019), 1,200 mg (11/20/2019), 1,200 mg (01/22/2020), 1,200 mg (02/12/2020), 1,200 mg (03/04/2020), 1,200 mg (03/25/2020)  for chemotherapy treatment.      ALLERGIES:  has No Known Allergies.  MEDICATIONS:  Current Outpatient Medications  Medication Sig Dispense Refill  . acetaminophen (TYLENOL) 500 MG tablet Take 500 mg by mouth 2 (two) times daily as needed for moderate pain or headache.    Huey Bienenstock (TECENTRIQ IV) Inject 1 Dose into the vein every 30 (thirty) days.    . Cholecalciferol (DIALYVITE VITAMIN D 5000) 125 MCG (5000 UT) capsule Take 5,000 Units by mouth daily.    . diphenhydrAMINE-zinc acetate (BENADRYL EXTRA STRENGTH) cream Apply 1 application topically 3 (three) times daily as needed for itching. 28.4 g 0  . fentaNYL (DURAGESIC) 25 MCG/HR Place 1 patch onto the skin every 3 (three) days. 10 patch 0  . HYDROcodone-acetaminophen (NORCO/VICODIN) 5-325 MG tablet Take 1 tablet by mouth every 6 (six) hours as needed for moderate pain. 90 tablet 0  . lidocaine-prilocaine (EMLA) cream Apply 1 application topically daily as needed (port access). 30 g 2  . loperamide (IMODIUM A-D) 2 MG tablet Take 2  mg by mouth 4 (four) times daily as needed for diarrhea or loose stools.    . nitroGLYCERIN (NITROSTAT) 0.4 MG SL tablet Place 1 tablet (0.4 mg total) under the tongue every 5 (five) minutes x 3 doses as needed for chest pain. 25 tablet 12  . omeprazole (PRILOSEC) 40 MG capsule Take 1 capsule (40 mg total) by mouth daily. 90 capsule 1  . polyethylene glycol (MIRALAX / GLYCOLAX) packet Take 17 g by mouth daily as needed for mild constipation.     . sertraline (ZOLOFT) 50 MG tablet Take 1.5 tablets (75 mg total) by mouth daily. 45  tablet 1  . vitamin B-12 (CYANOCOBALAMIN) 1000 MCG tablet Take 1,000 mcg by mouth daily.    . Zoledronic Acid (ZOMETA IV) Inject 1 Dose into the vein every 30 (thirty) days.     No current facility-administered medications for this visit.   Facility-Administered Medications Ordered in Other Visits  Medication Dose Route Frequency Provider Last Rate Last Admin  . heparin lock flush 100 unit/mL  500 Units Intravenous Once Earlie Server, MD      . heparin lock flush 100 unit/mL  500 Units Intravenous Once Earlie Server, MD      . heparin lock flush 100 unit/mL  500 Units Intravenous Once Earlie Server, MD      . sodium chloride flush (NS) 0.9 % injection 10 mL  10 mL Intravenous PRN Earlie Server, MD   10 mL at 01/09/19 0820  . sodium chloride flush (NS) 0.9 % injection 10 mL  10 mL Intravenous PRN Earlie Server, MD   10 mL at 01/10/19 1400    VITAL SIGNS: There were no vitals taken for this visit. There were no vitals filed for this visit.  Estimated body mass index is 22.3 kg/m as calculated from the following:   Height as of 02/28/20: 5\' 7"  (1.702 m).   Weight as of an earlier encounter on 04/15/20: 142 lb 6.4 oz (64.6 kg).  LABS: CBC:    Component Value Date/Time   WBC 7.8 04/15/2020 0858   HGB 12.3 04/15/2020 0858   HGB 14.5 09/19/2018 1038   HCT 36.2 04/15/2020 0858   HCT 42.7 09/19/2018 1038   PLT 172 04/15/2020 0858   PLT 265 09/19/2018 1038   MCV 87.4 04/15/2020 0858   MCV 91 09/19/2018 1038   NEUTROABS 4.9 04/15/2020 0858   NEUTROABS 5.1 09/19/2018 1038   LYMPHSABS 1.6 04/15/2020 0858   LYMPHSABS 2.0 09/19/2018 1038   MONOABS 0.9 04/15/2020 0858   EOSABS 0.3 04/15/2020 0858   EOSABS 0.3 09/19/2018 1038   BASOSABS 0.1 04/15/2020 0858   BASOSABS 0.1 09/19/2018 1038   Comprehensive Metabolic Panel:    Component Value Date/Time   NA 136 04/15/2020 0858   NA 136 09/19/2018 1038   K 3.8 04/15/2020 0858   CL 100 04/15/2020 0858   CO2 25 04/15/2020 0858   BUN 20 04/15/2020 0858   BUN 6 (L)  09/19/2018 1038   CREATININE 0.80 04/15/2020 0858   GLUCOSE 107 (H) 04/15/2020 0858   CALCIUM 9.3 04/15/2020 0858   AST 13 (L) 04/15/2020 0858   ALT 12 04/15/2020 0858   ALKPHOS 48 04/15/2020 0858   BILITOT 0.8 04/15/2020 0858   BILITOT 0.5 09/19/2018 1038   PROT 7.4 04/15/2020 0858   PROT 7.4 09/19/2018 1038   ALBUMIN 4.1 04/15/2020 0858   ALBUMIN 4.3 09/19/2018 1038    RADIOGRAPHIC STUDIES: MR Brain W Wo Contrast  Result Date: 04/13/2020 CLINICAL  DATA:  74 year old female with history of small cell lung cancer status post prophylactic cranial radiation in May and June 2020. Restaging. EXAM: MRI HEAD WITHOUT AND WITH CONTRAST TECHNIQUE: Multiplanar, multiecho pulse sequences of the brain and surrounding structures were obtained without and with intravenous contrast. CONTRAST:  64mL GADAVIST GADOBUTROL 1 MMOL/ML IV SOLN COMPARISON:  Brain MRI 12/01/2019 and earlier. FINDINGS: Brain: No abnormal enhancement identified. No midline shift, mass effect, or evidence of intracranial mass lesion. No dural thickening. No precontrast sagittal T1 imaging is provided today. No restricted diffusion to suggest acute infarction. No ventriculomegaly, extra-axial collection or acute intracranial hemorrhage. Cervicomedullary junction and pituitary are within normal limits. Confluent bilateral cerebral white matter T2 and FLAIR hyperintensity compatible with whole brain radiation appears stable. Stable superimposed patchy T2 heterogeneity in the bilateral deep gray nuclei and pons. No cortical encephalomalacia or chronic cerebral blood products identified. Vascular: Major intracranial vascular flow voids are stable since 2019, with chronically decreased distal left vertebral artery flow void. The major dural venous sinuses are enhancing and appear to be patent. Skull and upper cervical spine: Stable and negative visible cervical spine. No suspicious marrow signal identified. Sinuses/Orbits: Negative orbits. Paranasal  sinuses and mastoids remain well pneumatized. Other: Visible internal auditory structures appear normal. Scalp and face soft tissues appear negative. IMPRESSION: 1. No metastatic disease or acute intracranial abnormality. 2. 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 Electronically Signed   By: Genevie Ann M.D.   On: 04/13/2020 12:58    PERFORMANCE STATUS (ECOG) : 2-3  Review of Systems Unless otherwise noted, a complete review of systems is negative.  Physical Exam General: NAD, frail appearing, thin Pulmonary: Unlabored Extremities: no edema, no joint deformities Skin: no rashes Neurological: Weakness but otherwise nonfocal  IMPRESSION: Routine follow-up visit today.    MR brain on 04/12/2020 was stable in appearance.  Patient is reportedly doing about the same.  No significant changes or concerns today.  Pain is stable on current regimen of transdermal fentanyl.  She requires infrequent use of Norco for breakthrough pain.  Weight is stable 142 pounds.  Patient says she is planning a trip in a couple weeks to [place] to visit family.   PLAN: -Continue current scope of treatment -Continue transdermal fentanyl with as needed Norco for breakthrough pain -Refill EMLA cream -Virtual visit in 1 to 2 months   Patient expressed understanding and was in agreement with this plan. She also understands that She can call the clinic at any time with any questions, concerns, or complaints.     Time Total: 15 minutes  Visit consisted of counseling and education dealing with the complex and emotionally intense issues of symptom management and palliative care in the setting of serious and potentially life-threatening illness.Greater than 50%  of this time was spent counseling and coordinating care related to the above assessment and plan.  Signed by: Altha Harm, PhD,  NP-C [phone] (Work Cell)

## 2020-04-15 NOTE — Telephone Encounter (Signed)
Prior auth for lidocaine-prilocaine submitted via [email or web address]. Approval/denial pending  [place] PA case ID [id number] Rx: [id number]

## 2020-04-16 ENCOUNTER — Telehealth: Payer: Self-pay

## 2020-04-16 ENCOUNTER — Other Ambulatory Visit: Payer: Self-pay

## 2020-04-16 DIAGNOSIS — C3490 Malignant neoplasm of unspecified part of unspecified bronchus or lung: Secondary | ICD-10-CM

## 2020-04-16 NOTE — Telephone Encounter (Signed)
Done.. Cortisol labs has been scheduled as requested Pt is aware

## 2020-04-16 NOTE — Telephone Encounter (Signed)
-----   Message from Earlie Server, MD sent at 04/16/2020  8:13 AM EDT ----- Cortisol is border line.  Please arrange patient to check free T3 and Free T4, ACTH.

## 2020-04-17 ENCOUNTER — Telehealth: Payer: Self-pay

## 2020-04-17 NOTE — Telephone Encounter (Signed)
Telephone call to patient to schedule palliative care visit with patient. Patient/family in agreement with home visit on 04-19-20 at 1:00PM.

## 2020-04-18 ENCOUNTER — Inpatient Hospital Stay: Payer: Medicare Other

## 2020-04-18 ENCOUNTER — Other Ambulatory Visit: Payer: Self-pay

## 2020-04-18 DIAGNOSIS — C7931 Secondary malignant neoplasm of brain: Secondary | ICD-10-CM | POA: Diagnosis not present

## 2020-04-18 DIAGNOSIS — C3490 Malignant neoplasm of unspecified part of unspecified bronchus or lung: Secondary | ICD-10-CM

## 2020-04-18 DIAGNOSIS — C7951 Secondary malignant neoplasm of bone: Secondary | ICD-10-CM | POA: Diagnosis not present

## 2020-04-18 DIAGNOSIS — Z5112 Encounter for antineoplastic immunotherapy: Secondary | ICD-10-CM | POA: Diagnosis not present

## 2020-04-18 DIAGNOSIS — Z79899 Other long term (current) drug therapy: Secondary | ICD-10-CM | POA: Diagnosis not present

## 2020-04-18 LAB — T4, FREE: Free T4: 1.17 ng/dL — ABNORMAL HIGH (ref 0.61–1.12)

## 2020-04-19 ENCOUNTER — Other Ambulatory Visit: Payer: Medicare Other

## 2020-04-19 ENCOUNTER — Telehealth: Payer: Self-pay

## 2020-04-19 LAB — T3, FREE: T3, Free: 2.6 pg/mL (ref 2.0–4.4)

## 2020-04-19 LAB — ACTH: C206 ACTH: 28.9 pg/mL (ref 7.2–63.3)

## 2020-04-19 NOTE — Telephone Encounter (Signed)
Telephone call to NP Josh Borders to inform Kristi [name] that patient wants to discontinue palliative care services.  Palliative care team has reached out to patient multiple times and patient declines or cancels visit after being scheduled.  Patient informed SW that she is t busy and disorganized to have palliative team visit.  Patient states she may want services in the future.  Josh verbalizes appreciation for update.

## 2020-04-19 NOTE — Telephone Encounter (Signed)
SW contacted patient to confirm visit, at her request. Patient declines visit today stating that it was "not a good day". Patient reports feeling overwhelmed with appointments and request to stop palliative care services at this time. SW notified team. Encouraged patient to talk with staff at the cancer center if she changes her mind. Patient agreeable and appreciative.

## 2020-04-29 ENCOUNTER — Ambulatory Visit
Admission: RE | Admit: 2020-04-29 | Discharge: 2020-04-29 | Disposition: A | Discharge status: Home / Self Care | Payer: Medicare Other | Source: Ambulatory Visit | Attending: Oncology | Admitting: Oncology

## 2020-04-29 ENCOUNTER — Other Ambulatory Visit: Payer: Self-pay

## 2020-04-29 DIAGNOSIS — J439 Emphysema, unspecified: Secondary | ICD-10-CM | POA: Diagnosis not present

## 2020-04-29 DIAGNOSIS — C3490 Malignant neoplasm of unspecified part of unspecified bronchus or lung: Secondary | ICD-10-CM | POA: Diagnosis not present

## 2020-04-29 DIAGNOSIS — K439 Ventral hernia without obstruction or gangrene: Secondary | ICD-10-CM | POA: Diagnosis not present

## 2020-04-29 MED ORDER — IOHEXOL 300 MG/ML  SOLN
100.0000 mL | Freq: Once | INTRAMUSCULAR | Status: AC | PRN
  Administered 2020-04-29: 10:00:00 100 mL via INTRAVENOUS

## 2020-04-29 NOTE — Progress Notes (Signed)

## 2020-05-06 ENCOUNTER — Other Ambulatory Visit: Payer: Self-pay

## 2020-05-06 ENCOUNTER — Inpatient Hospital Stay: Payer: Medicare Other | Attending: Oncology

## 2020-05-06 ENCOUNTER — Encounter: Payer: Self-pay | Admitting: Oncology

## 2020-05-06 ENCOUNTER — Inpatient Hospital Stay (HOSPITAL_BASED_OUTPATIENT_CLINIC_OR_DEPARTMENT_OTHER): Payer: Medicare Other | Admitting: Oncology

## 2020-05-06 ENCOUNTER — Inpatient Hospital Stay: Payer: Medicare Other

## 2020-05-06 VITALS — BP 109/75 | HR 84 | Temp 96.4°F | Resp 18 | Wt 142.4 lb

## 2020-05-06 DIAGNOSIS — R413 Other amnesia: Secondary | ICD-10-CM | POA: Diagnosis not present

## 2020-05-06 DIAGNOSIS — Z5112 Encounter for antineoplastic immunotherapy: Secondary | ICD-10-CM | POA: Diagnosis not present

## 2020-05-06 DIAGNOSIS — C7951 Secondary malignant neoplasm of bone: Secondary | ICD-10-CM | POA: Diagnosis not present

## 2020-05-06 DIAGNOSIS — C3490 Malignant neoplasm of unspecified part of unspecified bronchus or lung: Secondary | ICD-10-CM | POA: Insufficient documentation

## 2020-05-06 DIAGNOSIS — Z95828 Presence of other vascular implants and grafts: Secondary | ICD-10-CM

## 2020-05-06 DIAGNOSIS — R5383 Other fatigue: Secondary | ICD-10-CM

## 2020-05-06 LAB — CBC WITH DIFFERENTIAL/PLATELET
Abs Immature Granulocytes: 0.04 10*3/uL (ref 0.00–0.07)
Basophils Absolute: 0.1 10*3/uL (ref 0.0–0.1)
Basophils Relative: 1 %
Eosinophils Absolute: 0.3 10*3/uL (ref 0.0–0.5)
Eosinophils Relative: 5 %
HCT: 33.8 % — ABNORMAL LOW (ref 36.0–46.0)
Hemoglobin: 11.6 g/dL — ABNORMAL LOW (ref 12.0–15.0)
Immature Granulocytes: 1 %
Lymphocytes Relative: 26 %
Lymphs Abs: 1.7 10*3/uL (ref 0.7–4.0)
MCH: 30.1 pg (ref 26.0–34.0)
MCHC: 34.3 g/dL (ref 30.0–36.0)
MCV: 87.6 fL (ref 80.0–100.0)
Monocytes Absolute: 0.8 10*3/uL (ref 0.1–1.0)
Monocytes Relative: 13 %
Neutro Abs: 3.6 10*3/uL (ref 1.7–7.7)
Neutrophils Relative %: 54 %
Platelets: 183 10*3/uL (ref 150–400)
RBC: 3.86 MIL/uL — ABNORMAL LOW (ref 3.87–5.11)
RDW: 13.5 % (ref 11.5–15.5)
WBC: 6.5 10*3/uL (ref 4.0–10.5)
nRBC: 0 % (ref 0.0–0.2)

## 2020-05-06 LAB — COMPREHENSIVE METABOLIC PANEL
ALT: 10 U/L (ref 0–44)
AST: 13 U/L — ABNORMAL LOW (ref 15–41)
Albumin: 3.9 g/dL (ref 3.5–5.0)
Alkaline Phosphatase: 44 U/L (ref 38–126)
Anion gap: 9 (ref 5–15)
BUN: 21 mg/dL (ref 8–23)
CO2: 24 mmol/L (ref 22–32)
Calcium: 9 mg/dL (ref 8.9–10.3)
Chloride: 100 mmol/L (ref 98–111)
Creatinine, Ser: 0.79 mg/dL (ref 0.44–1.00)
GFR calc Af Amer: >60 mL/min (ref >60)
GFR calc non Af Amer: >60 mL/min (ref >60)
Glucose, Bld: 101 mg/dL — ABNORMAL HIGH (ref 70–99)
Potassium: 3.7 mmol/L (ref 3.5–5.1)
Sodium: 133 mmol/L — ABNORMAL LOW (ref 135–145)
Total Bilirubin: 0.5 mg/dL (ref 0.3–1.2)
Total Protein: 7.1 g/dL (ref 6.5–8.1)

## 2020-05-06 MED ORDER — DEXAMETHASONE SODIUM PHOSPHATE 10 MG/ML IJ SOLN
10.0000 mg | Freq: Once | INTRAMUSCULAR | Status: AC
  Administered 2020-05-06: 10 mg via INTRAVENOUS
  Filled 2020-05-06: qty 1

## 2020-05-06 MED ORDER — SODIUM CHLORIDE 0.9 % IV SOLN
Freq: Once | INTRAVENOUS | Status: AC
  Filled 2020-05-06: qty 250

## 2020-05-06 MED ORDER — HEPARIN SOD (PORK) LOCK FLUSH 100 UNIT/ML IV SOLN
INTRAVENOUS | Status: AC
  Filled 2020-05-06: qty 5

## 2020-05-06 MED ORDER — SODIUM CHLORIDE 0.9% FLUSH
10.0000 mL | INTRAVENOUS | Status: DC | PRN
  Filled 2020-05-06: qty 10

## 2020-05-06 MED ORDER — FENTANYL 25 MCG/HR TD PT72: 1.0000 | MEDICATED_PATCH | TRANSDERMAL | 0 refills | Status: DC

## 2020-05-06 MED ORDER — SODIUM CHLORIDE 0.9% FLUSH
10.0000 mL | INTRAVENOUS | Status: DC | PRN
  Administered 2020-05-06: 10 mL via INTRAVENOUS
  Filled 2020-05-06: qty 10

## 2020-05-06 MED ORDER — SODIUM CHLORIDE 0.9 % IV SOLN
1200.0000 mg | Freq: Once | INTRAVENOUS | Status: AC
  Administered 2020-05-06: 1200 mg via INTRAVENOUS
  Filled 2020-05-06: qty 20

## 2020-05-06 MED ORDER — HEPARIN SOD (PORK) LOCK FLUSH 100 UNIT/ML IV SOLN
500.0000 [IU] | Freq: Once | INTRAVENOUS | Status: AC | PRN
  Administered 2020-05-06: 500 [IU]
  Filled 2020-05-06: qty 5

## 2020-05-06 NOTE — Progress Notes (Signed)
Hematology/Oncology Follow up note [facility] [phone] [phone]   Patient Care Team: Glean Hess, MD as PCP - General (Internal Medicine) Charolette Forward, MD as Consulting Physician (Cardiology) Telford Nab, RN as Registered Nurse [name], MD as Radiation Oncologist (Radiation Oncology)  REFERRING PROVIDER: Dr. Felicie Morn REASON FOR VISIT:  Follow-up for small cell lung cancer,   HISTORY OF PRESENTING ILLNESS:  Kristi [name] is a  74 y.o.  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  10/20/2018 CT chest with contrast showed large mediastinal mass involving both hilar, left greater than right, consistent with lung carcinoma, The mass causes narrowing of the left mainstem bronchus with resultant volume loss on the left and a mediastinal shift to the left.  Moderate size left pleural effusion. 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  # MRI brain negative.  # Nov 2019- Jan 2020 s/p 4 cycles of Carbo/Etoposide/Tecentriq #  01/24/2019 interim CT scan done which showed continued positive response to therapy with continued reduction in mediastinal adenopathy.  No residual measurable left lung mass. CT findings of acute emphysematous cystitis. Urology did not feel that patient has pyelonephritis and recommend treatment with antibiotics because patient's immunocompromise. Patient finished treatment.   # Chest and whole brain radiation finished in May 2020 # 04/25/2019 resume Tecentriq every 3 weeks. # Patient had a fall from her stairs on 01/19/2020. In the emergency room she had CT chest abdomen pelvis CT, CT head without contrast, CT cervical spine without contrast. No CT evidence for acute intracranial abnormality, degenerative changes of cervical spine..  No CT evidence of acute thoracic abdomen  injury.  Severe compression fracture of T7, subacute.  # kyphoplasty procedure on 03/01/2020.  T7 biopsy negative for cancer.  INTERVAL HISTORY Kristi [name] is a 74 y.o. female who has above history reviewed by me today presents for evaluation prior to immunotherapy for treatment of extensive small cell lung cancer. Patient is on immunotherapy maintenance. She feels tired and sleeps a lot.  Otherwise no new complaints. Chronically forgetful.   Back pain patient use fentanyl patch 25 MCG every 72 hours, occasionally she uses Norco for breakthrough pain.  Review of Systems  Constitutional: Positive for fatigue. Negative for appetite change, chills and fever.  HENT:   Negative for hearing loss and voice change.   Eyes: Negative for eye problems.  Respiratory: Negative for chest tightness, cough and shortness of breath.   Cardiovascular: Negative for chest pain.  Gastrointestinal: Negative for abdominal distention, abdominal pain, blood in stool, diarrhea and nausea.  Endocrine: Negative for hot flashes.  Genitourinary: Negative for difficulty urinating and frequency.   Musculoskeletal: Positive for back pain. Negative for arthralgias.  Skin: Negative for itching and rash.  Neurological: Negative for extremity weakness.  Hematological: Negative for adenopathy.  Psychiatric/Behavioral: Negative for confusion and depression. The patient is not nervous/anxious.        Forgetful     MEDICAL HISTORY:  Past Medical History:  Diagnosis Date  . Acute MI, inferoposterior wall ([place]) 09/30/2014  . Claustrophobia   . Coronary artery disease   . GERD (gastroesophageal reflux disease)   . Hypercholesteremia   . Hypertension   . MI, old   . Pneumonia   . 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 [facility]) 10/27/2018    SURGICAL HISTORY: Past Surgical History:  Procedure Laterality Date  . APPENDECTOMY  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  . BLADDER NECK SUSPENSION    . CHOLECYSTECTOMY N/A 08/14/2018    Procedure: LAPAROSCOPIC CHOLECYSTECTOMY;  Surgeon: Jules Husbands, MD;  Location: [facility] ORS;  Service: General;  Laterality: N/A;  . CHOLECYSTECTOMY  11/2018  . CORONARY ANGIOPLASTY  09/29/2014  . CORONARY STENT PLACEMENT    . ENDOBRONCHIAL ULTRASOUND N/A 10/21/2018   Procedure: ENDOBRONCHIAL ULTRASOUND;  Surgeon: Laverle Hobby, MD;  Location: [facility] ORS;  Service: Pulmonary;  Laterality: N/A;  . HERNIA REPAIR    . IR FLUORO GUIDE CV LINE RIGHT  12/19/2018  . KYPHOPLASTY N/A 03/01/2020   Procedure: T7 KYPHOPLASTY;  Surgeon: Hessie Knows, MD;  Location: [facility] ORS;  Service: Orthopedics;  Laterality: N/A;  . LEFT HEART CATHETERIZATION WITH CORONARY ANGIOGRAM N/A 09/29/2014   Procedure: LEFT HEART CATHETERIZATION WITH CORONARY ANGIOGRAM;  Surgeon: Clent Demark, MD;  Location: [facility] CATH LAB;  Service: Cardiovascular;  Laterality: N/A;  . PORTA CATH INSERTION N/A 01/02/2019   Procedure: PORTA CATH INSERTION;  Surgeon: Algernon Huxley, MD;  Location: [facility] CV LAB;  Service: Cardiovascular;  Laterality: N/A;  . PORTACATH PLACEMENT Right 10/28/2018   Procedure: INSERTION PORT-A-CATH;  Surgeon: Jules Husbands, MD;  Location: [facility] ORS;  Service: General;  Laterality: Right;    SOCIAL HISTORY: Social History   Socioeconomic History  . Marital status: Married    Spouse name: [name]   . Number of children: 3  . Years of education: Not on file  . Highest education level: Not on file  Occupational History  . Occupation: Retired    Comment: Chief Technology Officer   Tobacco Use  . Smoking status: Former Smoker    Packs/day: 1.00    Years: 39.00    Pack years: 39.00    Types: Cigarettes    Start date: 12/21/1978    Quit date: 10/16/2018    Years since quitting: 1.5  . Smokeless tobacco: Never Used  Substance and Sexual Activity  . Alcohol use: Yes    Alcohol/week: 0.0 standard drinks    Comment: occassional - approx 1 every 2 weeks   . Drug use: No  . Sexual activity: Yes    Birth  control/protection: None  Other Topics Concern  . Not on file  Social History Narrative  . Not on file   Social Determinants of Health   Financial Resource Strain:   . Difficulty of Paying Living Expenses:   Food Insecurity:   . Worried About Charity fundraiser in the Last Year:   . Arboriculturist in the Last Year:   Transportation Needs:   . Film/video editor (Medical):   [name] Kitchen Lack of Transportation (Non-Medical):   Physical Activity:   . Days of Exercise per Week:   . Minutes of Exercise per Session:   Stress:   . Feeling of Stress :   Social Connections:   . Frequency of Communication with Friends and Family:   . Frequency of Social Gatherings with Friends and Family:   . Attends Religious Services:   . Active Member of Clubs or Organizations:   . Attends Archivist Meetings:   [name] Kitchen Marital Status:   Intimate Partner Violence:   . Fear of Current or Ex-Partner:   . Emotionally Abused:   [name] Kitchen Physically Abused:   . Sexually Abused:     FAMILY HISTORY: Family History  Problem Relation Age of Onset  . Alzheimer's disease Mother   . Colon cancer Father   .  Breast cancer Neg Hx     ALLERGIES:  has No Known Allergies.  MEDICATIONS:  Current Outpatient Medications  Medication Sig Dispense Refill  . acetaminophen (TYLENOL) 500 MG tablet Take 500 mg by mouth 2 (two) times daily as needed for moderate pain or headache.    Huey Bienenstock (TECENTRIQ IV) Inject 1 Dose into the vein every 30 (thirty) days.    . Cholecalciferol (DIALYVITE VITAMIN D 5000) 125 MCG (5000 UT) capsule Take 5,000 Units by mouth daily.    . diphenhydrAMINE-zinc acetate (BENADRYL EXTRA STRENGTH) cream Apply 1 application topically 3 (three) times daily as needed for itching. 28.4 g 0  . fentaNYL (DURAGESIC) 25 MCG/HR Place 1 patch onto the skin every 3 (three) days. 10 patch 0  . HYDROcodone-acetaminophen (NORCO/VICODIN) 5-325 MG tablet Take 1 tablet by mouth every 6 (six) hours as needed for  moderate pain. 90 tablet 0  . lidocaine-prilocaine (EMLA) cream Apply 1 application topically daily as needed (port access). 30 g 2  . loperamide (IMODIUM A-D) 2 MG tablet Take 2 mg by mouth 4 (four) times daily as needed for diarrhea or loose stools.    . nitroGLYCERIN (NITROSTAT) 0.4 MG SL tablet Place 1 tablet (0.4 mg total) under the tongue every 5 (five) minutes x 3 doses as needed for chest pain. 25 tablet 12  . omeprazole (PRILOSEC) 40 MG capsule Take 1 capsule (40 mg total) by mouth daily. 90 capsule 1  . polyethylene glycol (MIRALAX / GLYCOLAX) packet Take 17 g by mouth daily as needed for mild constipation.     . sertraline (ZOLOFT) 50 MG tablet Take 1.5 tablets (75 mg total) by mouth daily. 45 tablet 1  . vitamin B-12 (CYANOCOBALAMIN) 1000 MCG tablet Take 1,000 mcg by mouth daily.    . Zoledronic Acid (ZOMETA IV) Inject 1 Dose into the vein every 30 (thirty) days.     No current facility-administered medications for this visit.   Facility-Administered Medications Ordered in Other Visits  Medication Dose Route Frequency Provider Last Rate Last Admin  . heparin lock flush 100 unit/mL  500 Units Intravenous Once Earlie Server, MD      . heparin lock flush 100 unit/mL  500 Units Intravenous Once Earlie Server, MD      . sodium chloride flush (NS) 0.9 % injection 10 mL  10 mL Intravenous PRN Earlie Server, MD   10 mL at 01/09/19 0820  . sodium chloride flush (NS) 0.9 % injection 10 mL  10 mL Intravenous PRN Earlie Server, MD   10 mL at 01/10/19 1400  . sodium chloride flush (NS) 0.9 % injection 10 mL  10 mL Intravenous PRN Earlie Server, MD   10 mL at 05/06/20 0910     PHYSICAL EXAMINATION: ECOG PERFORMANCE STATUS: 1 - Symptomatic but completely ambulatory Vitals:   05/06/20 0924  BP: 109/75  Pulse: 84  Resp: 18  Temp: (!) 96.4 F (35.8 C)  SpO2: 95%   Filed Weights   05/06/20 0924  Weight: 142 lb 6.4 oz (64.6 kg)   Physical Exam  Constitutional: She is oriented to person, place, and time. No  distress.  HENT:  Head: Normocephalic and atraumatic.  Nose: Nose normal.  Mouth/Throat: Oropharynx is clear and moist. No oropharyngeal exudate.  Eyes: Pupils are equal, round, and reactive to light. EOM are normal. No scleral icterus.  Cardiovascular: Normal rate and regular rhythm.  No murmur heard. Pulmonary/Chest: Effort normal. No respiratory distress. She has no rales. She exhibits no tenderness.  Decreased breath sounds bilaterally, right side breath sound is more diminished.   Abdominal: Soft. She exhibits no distension. There is no abdominal tenderness.  Musculoskeletal:        General: No edema. Normal range of motion.     Cervical back: Normal range of motion and neck supple.  Neurological: She is alert and oriented to person, place, and time. No cranial nerve deficit. She exhibits normal muscle tone. Coordination normal.  Skin: Skin is warm and dry. She is not diaphoretic. No erythema.  Psychiatric: Affect normal.       LABORATORY DATA:  I have reviewed the data as listed Lab Results  Component Value Date   WBC 6.5 05/06/2020   HGB 11.6 (L) 05/06/2020   HCT 33.8 (L) 05/06/2020   MCV 87.6 05/06/2020   PLT 183 05/06/2020   Recent Labs    03/25/20 0909 04/15/20 0858 05/06/20 0858  NA 137 136 133*  K 3.6 3.8 3.7  CL 104 100 100  CO2 23 25 24   GLUCOSE 108* 107* 101*  BUN 18 20 21   CREATININE 0.81 0.80 0.79  CALCIUM 9.5 9.3 9.0  GFRNONAA >60 >60 >60  GFRAA >60 >60 >60  PROT 7.5 7.4 7.1  ALBUMIN 4.2 4.1 3.9  AST 13* 13* 13*  ALT 13 12 10   ALKPHOS 42 48 44  BILITOT 0.6 0.8 0.5    RADIOGRAPHIC STUDIES: I have personally reviewed the radiological images as listed and agreed with the findings in the report.  DG Thoracic Spine 2 View  Result Date: 03/01/2020 CLINICAL DATA:  T7 kyphoplasty. EXAM: THORACIC SPINE 2 VIEWS; DG C-ARM 1-60 MIN COMPARISON:  Chest CT 01/19/2020 FINDINGS: Frontal and lateral fluoroscopic spot views of the thoracic spine obtained in  the operating room. There is kyphoplasty material about midthoracic vertebra, level not delineated due to coned field of view. Reported fluoroscopy time 2 minutes 40 seconds. IMPRESSION: Fluoroscopic spot views after thoracic kyphoplasty. Electronically Signed   By: Keith Rake M.D.   On: 03/01/2020 14:06   CT Chest W Contrast  Result Date: 04/29/2020 CLINICAL DATA:  History of small cell lung cancer post chemotherapy and radiation therapy. EXAM: CT CHEST, ABDOMEN, AND PELVIS WITH CONTRAST TECHNIQUE: Multidetector CT imaging of the chest, abdomen and pelvis was performed following the standard protocol during bolus administration of intravenous contrast. CONTRAST:  183mL OMNIPAQUE IOHEXOL 300 MG/ML  SOLN COMPARISON:  Prior CTs 01/19/2020 and 10/23/2019. FINDINGS: CT CHEST FINDINGS Cardiovascular: No acute vascular findings. No evidence of pulmonary embolism. Right IJ Port-A-Cath extends to the superior cavoatrial junction. There is diffuse atherosclerosis of the aorta, great vessels and coronary arteries. Probable calcifications of the aortic valve. The heart size is normal. There is no pericardial effusion. Mediastinum/Nodes: There are no enlarged mediastinal, hilar or axillary lymph nodes. The thyroid gland, trachea and esophagus demonstrate no significant findings. Lungs/Pleura: No pleural effusion or pneumothorax. Stable mild centrilobular emphysema with central airway thickening and calcified right lung granulomas. There is chronic linear scarring at both lung bases. No suspicious pulmonary nodularity. Musculoskeletal/Chest wall: Status post interval spinal augmentation at T7. Cement extends into the T6-7 and T7-8 discs. There is a new mild inferior endplate compression deformity at T4. There is also new mild sclerosis posteriorly in the C7, T1 and T2 vertebral bodies. No lytic lesions identified. There is a healing fracture of the upper sternal body which is mildly displaced. CT ABDOMEN AND PELVIS  FINDINGS Hepatobiliary: The liver is normal in density without suspicious focal abnormality. No  significant biliary dilatation post cholecystectomy. Pancreas: Atrophied without ductal dilatation or surrounding inflammation. Spleen: Normal in size without focal abnormality. Adrenals/Urinary Tract: Both adrenal glands appear normal. The kidneys and ureters appear normal. No evidence of renal mass, urinary tract calculus or hydronephrosis. Stable bladder distension and probable small cystocele. Stomach/Bowel: No evidence of bowel wall thickening, distention or surrounding inflammatory change. Several small bowel loops and a portion of the mid transverse colon protrude into a ventral hernia. No evidence of incarceration or obstruction. Vascular/Lymphatic: There are no enlarged abdominal or pelvic lymph nodes. Aortic and branch vessel atherosclerosis. No acute vascular findings. The portal, superior mesenteric and splenic veins are patent. Reproductive: The uterus and ovaries appear stable. No adnexal mass. Other: Previous ventral hernia repair with stable recurrent hernia inferior to the mesh containing small bowel and transverse colon. No ascites or peritoneal nodularity. Musculoskeletal: No acute or significant osseous findings. IMPRESSION: 1. No evidence of local recurrence of lung cancer or metastatic disease in the chest, abdomen or pelvis. 2. Status post interval spinal augmentation at T7. There is a new mild inferior endplate compression deformity at T4 and new mild sclerosis posteriorly in the C7, T1 and T2 vertebral bodies, possibly reactive or related to previous radiation. Subacute fracture of the upper sternal body. 3. Stable recurrent ventral abdominal hernia inferior to the mesh containing small bowel and transverse colon. No evidence of incarceration or obstruction. 4. Aortic Atherosclerosis (ICD10-I70.0) and Emphysema (ICD10-J43.9). Electronically Signed   By: Richardean Sale M.D.   On: 04/29/2020 12:14     MR Brain W Wo Contrast  Result Date: 04/13/2020 CLINICAL DATA:  74 year old female with history of small cell lung cancer status post prophylactic cranial radiation in May and June 2020. Restaging. EXAM: MRI HEAD WITHOUT AND WITH CONTRAST TECHNIQUE: Multiplanar, multiecho pulse sequences of the brain and surrounding structures were obtained without and with intravenous contrast. CONTRAST:  28mL GADAVIST GADOBUTROL 1 MMOL/ML IV SOLN COMPARISON:  Brain MRI 12/01/2019 and earlier. FINDINGS: Brain: No abnormal enhancement identified. No midline shift, mass effect, or evidence of intracranial mass lesion. No dural thickening. No precontrast sagittal T1 imaging is provided today. No restricted diffusion to suggest acute infarction. No ventriculomegaly, extra-axial collection or acute intracranial hemorrhage. Cervicomedullary junction and pituitary are within normal limits. Confluent bilateral cerebral white matter T2 and FLAIR hyperintensity compatible with whole brain radiation appears stable. Stable superimposed patchy T2 heterogeneity in the bilateral deep gray nuclei and pons. No cortical encephalomalacia or chronic cerebral blood products identified. Vascular: Major intracranial vascular flow voids are stable since 2019, with chronically decreased distal left vertebral artery flow void. The major dural venous sinuses are enhancing and appear to be patent. Skull and upper cervical spine: Stable and negative visible cervical spine. No suspicious marrow signal identified. Sinuses/Orbits: Negative orbits. Paranasal sinuses and mastoids remain well pneumatized. Other: Visible internal auditory structures appear normal. Scalp and face soft tissues appear negative. IMPRESSION: 1. No metastatic disease or acute intracranial abnormality. 2. 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 Electronically Signed    By: Genevie Ann M.D.   On: 04/13/2020 12:58   CT Abdomen Pelvis W Contrast  Result Date: 04/29/2020 CLINICAL DATA:  History of small cell lung cancer post chemotherapy and radiation therapy. EXAM: CT CHEST, ABDOMEN, AND PELVIS WITH CONTRAST TECHNIQUE: Multidetector CT imaging of the chest, abdomen and pelvis was performed following the standard protocol during bolus administration of  intravenous contrast. CONTRAST:  117mL OMNIPAQUE IOHEXOL 300 MG/ML  SOLN COMPARISON:  Prior CTs 01/19/2020 and 10/23/2019. FINDINGS: CT CHEST FINDINGS Cardiovascular: No acute vascular findings. No evidence of pulmonary embolism. Right IJ Port-A-Cath extends to the superior cavoatrial junction. There is diffuse atherosclerosis of the aorta, great vessels and coronary arteries. Probable calcifications of the aortic valve. The heart size is normal. There is no pericardial effusion. Mediastinum/Nodes: There are no enlarged mediastinal, hilar or axillary lymph nodes. The thyroid gland, trachea and esophagus demonstrate no significant findings. Lungs/Pleura: No pleural effusion or pneumothorax. Stable mild centrilobular emphysema with central airway thickening and calcified right lung granulomas. There is chronic linear scarring at both lung bases. No suspicious pulmonary nodularity. Musculoskeletal/Chest wall: Status post interval spinal augmentation at T7. Cement extends into the T6-7 and T7-8 discs. There is a new mild inferior endplate compression deformity at T4. There is also new mild sclerosis posteriorly in the C7, T1 and T2 vertebral bodies. No lytic lesions identified. There is a healing fracture of the upper sternal body which is mildly displaced. CT ABDOMEN AND PELVIS FINDINGS Hepatobiliary: The liver is normal in density without suspicious focal abnormality. No significant biliary dilatation post cholecystectomy. Pancreas: Atrophied without ductal dilatation or surrounding inflammation. Spleen: Normal in size without focal  abnormality. Adrenals/Urinary Tract: Both adrenal glands appear normal. The kidneys and ureters appear normal. No evidence of renal mass, urinary tract calculus or hydronephrosis. Stable bladder distension and probable small cystocele. Stomach/Bowel: No evidence of bowel wall thickening, distention or surrounding inflammatory change. Several small bowel loops and a portion of the mid transverse colon protrude into a ventral hernia. No evidence of incarceration or obstruction. Vascular/Lymphatic: There are no enlarged abdominal or pelvic lymph nodes. Aortic and branch vessel atherosclerosis. No acute vascular findings. The portal, superior mesenteric and splenic veins are patent. Reproductive: The uterus and ovaries appear stable. No adnexal mass. Other: Previous ventral hernia repair with stable recurrent hernia inferior to the mesh containing small bowel and transverse colon. No ascites or peritoneal nodularity. Musculoskeletal: No acute or significant osseous findings. IMPRESSION: 1. No evidence of local recurrence of lung cancer or metastatic disease in the chest, abdomen or pelvis. 2. Status post interval spinal augmentation at T7. There is a new mild inferior endplate compression deformity at T4 and new mild sclerosis posteriorly in the C7, T1 and T2 vertebral bodies, possibly reactive or related to previous radiation. Subacute fracture of the upper sternal body. 3. Stable recurrent ventral abdominal hernia inferior to the mesh containing small bowel and transverse colon. No evidence of incarceration or obstruction. 4. Aortic Atherosclerosis (ICD10-I70.0) and Emphysema (ICD10-J43.9). Electronically Signed   By: Richardean Sale M.D.   On: 04/29/2020 12:14   DG C-Arm 1-60 Min  Result Date: 03/01/2020 CLINICAL DATA:  T7 kyphoplasty. EXAM: THORACIC SPINE 2 VIEWS; DG C-ARM 1-60 MIN COMPARISON:  Chest CT 01/19/2020 FINDINGS: Frontal and lateral fluoroscopic spot views of the thoracic spine obtained in the  operating room. There is kyphoplasty material about midthoracic vertebra, level not delineated due to coned field of view. Reported fluoroscopy time 2 minutes 40 seconds. IMPRESSION: Fluoroscopic spot views after thoracic kyphoplasty. Electronically Signed   By: Keith Rake M.D.   On: 03/01/2020 14:06     ASSESSMENT & PLAN:  1. Small cell lung cancer ([place])   2. Encounter for antineoplastic immunotherapy   3. Memory loss   4. Other fatigue    #Extensive small cell lung cancer S/p 4 cycles of Carboplatin, Etoposide and Tecentriq.  Status post chest and whole brain radiation.  Recent MRI brain images and CT chest abdomen pelvis images were independently reviewed and discussed with patient. She is clinically doing very well.  Images shows no definitive evidence of recurrence or metastatic disease. I recommend patient to continue immunotherapy with Tecentriq every 3 weeks.  Fatigue, may be secondary to immunotherapy.  TSH has been monitored. Osteopenia, recommend patient to continue calcium and vitamin D supplementation. Bone metastasis, patient has been on Zometa every 6 weeks and will proceed 1 dose at the next visit. She prefers next visit to be postponed to 06/04/2020 due to family reunion.  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 #Frequent falls,  continue physical therapy  Follow up in 3 weeks for eval with next cycle of tecentriq    Earlie Server, MD, PhD

## 2020-05-06 NOTE — Progress Notes (Signed)
Pt here for follow up. Mr. Beddow reports that pt has been sleeping more than normal. She reports being more tired.

## 2020-05-13 ENCOUNTER — Other Ambulatory Visit: Payer: Self-pay | Admitting: *Deleted

## 2020-05-13 DIAGNOSIS — F32A Depression, unspecified: Secondary | ICD-10-CM

## 2020-05-13 MED ORDER — SERTRALINE HCL 50 MG PO TABS: 75.0000 mg | ORAL_TABLET | Freq: Every day | ORAL | 1 refills | Status: DC

## 2020-06-04 ENCOUNTER — Telehealth: Payer: Self-pay

## 2020-06-04 ENCOUNTER — Inpatient Hospital Stay: Payer: Medicare Other | Attending: Oncology

## 2020-06-04 ENCOUNTER — Other Ambulatory Visit: Payer: Self-pay

## 2020-06-04 ENCOUNTER — Inpatient Hospital Stay (HOSPITAL_BASED_OUTPATIENT_CLINIC_OR_DEPARTMENT_OTHER): Payer: Medicare Other | Admitting: Oncology

## 2020-06-04 ENCOUNTER — Encounter: Payer: Self-pay | Admitting: Oncology

## 2020-06-04 ENCOUNTER — Inpatient Hospital Stay: Payer: Medicare Other

## 2020-06-04 VITALS — BP 103/73 | HR 76 | Temp 96.3°F | Resp 18 | Wt 137.7 lb

## 2020-06-04 VITALS — BP 101/69 | HR 70 | Resp 18

## 2020-06-04 DIAGNOSIS — Z800 Family history of malignant neoplasm of digestive organs: Secondary | ICD-10-CM | POA: Diagnosis not present

## 2020-06-04 DIAGNOSIS — E7800 Pure hypercholesterolemia, unspecified: Secondary | ICD-10-CM | POA: Insufficient documentation

## 2020-06-04 DIAGNOSIS — R5383 Other fatigue: Secondary | ICD-10-CM

## 2020-06-04 DIAGNOSIS — I2510 Atherosclerotic heart disease of native coronary artery without angina pectoris: Secondary | ICD-10-CM | POA: Diagnosis not present

## 2020-06-04 DIAGNOSIS — J439 Emphysema, unspecified: Secondary | ICD-10-CM | POA: Insufficient documentation

## 2020-06-04 DIAGNOSIS — Z95828 Presence of other vascular implants and grafts: Secondary | ICD-10-CM

## 2020-06-04 DIAGNOSIS — C3490 Malignant neoplasm of unspecified part of unspecified bronchus or lung: Secondary | ICD-10-CM | POA: Insufficient documentation

## 2020-06-04 DIAGNOSIS — I10 Essential (primary) hypertension: Secondary | ICD-10-CM | POA: Diagnosis not present

## 2020-06-04 DIAGNOSIS — M8580 Other specified disorders of bone density and structure, unspecified site: Secondary | ICD-10-CM | POA: Insufficient documentation

## 2020-06-04 DIAGNOSIS — E860 Dehydration: Secondary | ICD-10-CM

## 2020-06-04 DIAGNOSIS — S22000A Wedge compression fracture of unspecified thoracic vertebra, initial encounter for closed fracture: Secondary | ICD-10-CM

## 2020-06-04 DIAGNOSIS — I252 Old myocardial infarction: Secondary | ICD-10-CM | POA: Insufficient documentation

## 2020-06-04 DIAGNOSIS — Z5112 Encounter for antineoplastic immunotherapy: Secondary | ICD-10-CM | POA: Diagnosis not present

## 2020-06-04 DIAGNOSIS — R634 Abnormal weight loss: Secondary | ICD-10-CM | POA: Diagnosis not present

## 2020-06-04 DIAGNOSIS — R413 Other amnesia: Secondary | ICD-10-CM

## 2020-06-04 DIAGNOSIS — Z79899 Other long term (current) drug therapy: Secondary | ICD-10-CM | POA: Insufficient documentation

## 2020-06-04 DIAGNOSIS — Z87891 Personal history of nicotine dependence: Secondary | ICD-10-CM | POA: Insufficient documentation

## 2020-06-04 LAB — COMPREHENSIVE METABOLIC PANEL
ALT: 11 U/L (ref 0–44)
AST: 15 U/L (ref 15–41)
Albumin: 3.7 g/dL (ref 3.5–5.0)
Alkaline Phosphatase: 55 U/L (ref 38–126)
Anion gap: 9 (ref 5–15)
BUN: 16 mg/dL (ref 8–23)
CO2: 24 mmol/L (ref 22–32)
Calcium: 8.8 mg/dL — ABNORMAL LOW (ref 8.9–10.3)
Chloride: 104 mmol/L (ref 98–111)
Creatinine, Ser: 0.75 mg/dL (ref 0.44–1.00)
GFR calc Af Amer: >60 mL/min (ref >60)
GFR calc non Af Amer: >60 mL/min (ref >60)
Glucose, Bld: 105 mg/dL — ABNORMAL HIGH (ref 70–99)
Potassium: 3.6 mmol/L (ref 3.5–5.1)
Sodium: 137 mmol/L (ref 135–145)
Total Bilirubin: 0.5 mg/dL (ref 0.3–1.2)
Total Protein: 7 g/dL (ref 6.5–8.1)

## 2020-06-04 LAB — CBC WITH DIFFERENTIAL/PLATELET
Abs Immature Granulocytes: 0.04 10*3/uL (ref 0.00–0.07)
Basophils Absolute: 0 10*3/uL (ref 0.0–0.1)
Basophils Relative: 1 %
Eosinophils Absolute: 0.3 10*3/uL (ref 0.0–0.5)
Eosinophils Relative: 5 %
HCT: 32.5 % — ABNORMAL LOW (ref 36.0–46.0)
Hemoglobin: 11 g/dL — ABNORMAL LOW (ref 12.0–15.0)
Immature Granulocytes: 1 %
Lymphocytes Relative: 21 %
Lymphs Abs: 1.1 10*3/uL (ref 0.7–4.0)
MCH: 29.8 pg (ref 26.0–34.0)
MCHC: 33.8 g/dL (ref 30.0–36.0)
MCV: 88.1 fL (ref 80.0–100.0)
Monocytes Absolute: 0.6 10*3/uL (ref 0.1–1.0)
Monocytes Relative: 12 %
Neutro Abs: 3.1 10*3/uL (ref 1.7–7.7)
Neutrophils Relative %: 60 %
Platelets: 183 10*3/uL (ref 150–400)
RBC: 3.69 MIL/uL — ABNORMAL LOW (ref 3.87–5.11)
RDW: 13.7 % (ref 11.5–15.5)
WBC: 5.1 10*3/uL (ref 4.0–10.5)
nRBC: 0 % (ref 0.0–0.2)

## 2020-06-04 LAB — CORTISOL: Cortisol, Plasma: 14.4 ug/dL

## 2020-06-04 LAB — T4, FREE: Free T4: 1.18 ng/dL — ABNORMAL HIGH (ref 0.61–1.12)

## 2020-06-04 LAB — TSH: TSH: 0.848 u[IU]/mL (ref 0.350–4.500)

## 2020-06-04 MED ORDER — SODIUM CHLORIDE 0.9 % IV SOLN
Freq: Once | INTRAVENOUS | Status: AC
  Filled 2020-06-04: qty 250

## 2020-06-04 MED ORDER — ZOLEDRONIC ACID 4 MG/100ML IV SOLN
4.0000 mg | Freq: Once | INTRAVENOUS | Status: AC
  Administered 2020-06-04: 4 mg via INTRAVENOUS
  Filled 2020-06-04: qty 100

## 2020-06-04 MED ORDER — HEPARIN SOD (PORK) LOCK FLUSH 100 UNIT/ML IV SOLN
INTRAVENOUS | Status: AC
  Filled 2020-06-04: qty 5

## 2020-06-04 MED ORDER — DEXAMETHASONE SODIUM PHOSPHATE 10 MG/ML IJ SOLN: 10.0000 mg | Freq: Once | INTRAMUSCULAR | Status: DC

## 2020-06-04 MED ORDER — SODIUM CHLORIDE 0.9 % IV SOLN
1200.0000 mg | Freq: Once | INTRAVENOUS | Status: AC
  Administered 2020-06-04: 1200 mg via INTRAVENOUS
  Filled 2020-06-04: qty 20

## 2020-06-04 MED ORDER — SODIUM CHLORIDE 0.9 % IV SOLN
10.0000 mg | Freq: Once | INTRAVENOUS | Status: AC
  Administered 2020-06-04: 10 mg via INTRAVENOUS
  Filled 2020-06-04: qty 10

## 2020-06-04 MED ORDER — HEPARIN SOD (PORK) LOCK FLUSH 100 UNIT/ML IV SOLN
500.0000 [IU] | Freq: Once | INTRAVENOUS | Status: AC | PRN
  Administered 2020-06-04: 500 [IU]
  Filled 2020-06-04: qty 5

## 2020-06-04 NOTE — Telephone Encounter (Signed)
Done..  Pt has been sched to see Joli as requested. 1st avail date for NUT is 06/13/20 A detailed message was left on pt vmail making her aware of her scheduled appt on 06/13/20 @ 9:00am

## 2020-06-04 NOTE — Progress Notes (Signed)
Pt in for follow up, denies any changes or concerns today.

## 2020-06-04 NOTE — Telephone Encounter (Signed)
Pt last saw Joli on 04/24/19. Please schedule pt for follow with Joli and notify pt. Thanks.

## 2020-06-04 NOTE — Progress Notes (Signed)
Hematology/Oncology Follow up note [facility] [place] [phone] [phone]   Patient Care Team: Glean Hess, MD as PCP - General (Internal Medicine) Charolette Forward, MD as Consulting Physician (Cardiology) Telford Nab, RN as Registered Nurse [name], MD as Radiation Oncologist (Radiation Oncology)  REFERRING PROVIDER: Dr. Felicie Morn REASON FOR VISIT:  Follow-up for small cell lung cancer,   HISTORY OF PRESENTING ILLNESS:  Kristi [name] is a  74 y.o.  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  10/20/2018 CT chest with contrast showed large mediastinal mass involving both hilar, left greater than right, consistent with lung carcinoma, The mass causes narrowing of the left mainstem bronchus with resultant volume loss on the left and a mediastinal shift to the left.  Moderate size left pleural effusion. 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  # MRI brain negative.  # Nov 2019- Jan 2020 s/p 4 cycles of Carbo/Etoposide/Tecentriq #  01/24/2019 interim CT scan done which showed continued positive response to therapy with continued reduction in mediastinal adenopathy.  No residual measurable left lung mass. CT findings of acute emphysematous cystitis. Urology did not feel that patient has pyelonephritis and recommend treatment with antibiotics because patient's immunocompromise. Patient finished treatment.   # Chest and whole brain radiation finished in May 2020 # 04/25/2019 resume Tecentriq every 3 weeks. # Patient had a fall from her stairs on 01/19/2020. In the emergency room she had CT chest abdomen pelvis CT, CT head without contrast, CT cervical spine without contrast. No CT evidence for acute intracranial abnormality, degenerative changes of cervical spine..  No CT evidence of acute thoracic abdomen  injury.  Severe compression fracture of T7, subacute.  # kyphoplasty procedure on 03/01/2020.  T7 biopsy negative for cancer.  INTERVAL HISTORY Kristi [name] is a 74 y.o. female who has above history reviewed by me today presents for evaluation prior to immunotherapy for treatment of extensive small cell lung cancer. Patient is on immunotherapy maintenance. Patient was accompanied by her husband today. She continues to have upper back pain for which she uses fentanyl patch and occasionally uses Norco for breakthrough pain.  Pain is well controlled. Feels tired and fatigued.  Otherwise no new complaints. Forgetful   Review of Systems  Constitutional: Positive for fatigue. Negative for appetite change, chills and fever.  HENT:   Negative for hearing loss and voice change.   Eyes: Negative for eye problems.  Respiratory: Negative for chest tightness, cough and shortness of breath.   Cardiovascular: Negative for chest pain.  Gastrointestinal: Negative for abdominal distention, abdominal pain, blood in stool, diarrhea and nausea.  Endocrine: Negative for hot flashes.  Genitourinary: Negative for difficulty urinating and frequency.   Musculoskeletal: Positive for back pain. Negative for arthralgias.  Skin: Negative for itching and rash.  Neurological: Negative for extremity weakness.  Hematological: Negative for adenopathy.  Psychiatric/Behavioral: Negative for confusion and depression. The patient is not nervous/anxious.        Forgetful     MEDICAL HISTORY:  Past Medical History:  Diagnosis Date  . Acute MI, inferoposterior wall ([place]) 09/30/2014  . Claustrophobia   . Coronary artery disease   . GERD (gastroesophageal reflux disease)   . Hypercholesteremia   . Hypertension   . MI, old   . Pneumonia   . 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 [facility]) 10/27/2018    SURGICAL HISTORY: Past  Surgical History:  Procedure Laterality Date  . APPENDECTOMY     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  .  BLADDER NECK SUSPENSION    . CHOLECYSTECTOMY N/A 08/14/2018   Procedure: LAPAROSCOPIC CHOLECYSTECTOMY;  Surgeon: Jules Husbands, MD;  Location: [facility] ORS;  Service: General;  Laterality: N/A;  . CHOLECYSTECTOMY  11/2018  . CORONARY ANGIOPLASTY  09/29/2014  . CORONARY STENT PLACEMENT    . ENDOBRONCHIAL ULTRASOUND N/A 10/21/2018   Procedure: ENDOBRONCHIAL ULTRASOUND;  Surgeon: Laverle Hobby, MD;  Location: [facility] ORS;  Service: Pulmonary;  Laterality: N/A;  . HERNIA REPAIR    . IR FLUORO GUIDE CV LINE RIGHT  12/19/2018  . KYPHOPLASTY N/A 03/01/2020   Procedure: T7 KYPHOPLASTY;  Surgeon: Hessie Knows, MD;  Location: [facility] ORS;  Service: Orthopedics;  Laterality: N/A;  . LEFT HEART CATHETERIZATION WITH CORONARY ANGIOGRAM N/A 09/29/2014   Procedure: LEFT HEART CATHETERIZATION WITH CORONARY ANGIOGRAM;  Surgeon: Clent Demark, MD;  Location: [place] CATH LAB;  Service: Cardiovascular;  Laterality: N/A;  . PORTA CATH INSERTION N/A 01/02/2019   Procedure: PORTA CATH INSERTION;  Surgeon: Algernon Huxley, MD;  Location: [place] CV LAB;  Service: Cardiovascular;  Laterality: N/A;  . PORTACATH PLACEMENT Right 10/28/2018   Procedure: INSERTION PORT-A-CATH;  Surgeon: Jules Husbands, MD;  Location: [facility] ORS;  Service: General;  Laterality: Right;    SOCIAL HISTORY: Social History   Socioeconomic History  . Marital status: Married    Spouse name: [name]   . Number of children: 3  . Years of education: Not on file  . Highest education level: Not on file  Occupational History  . Occupation: Retired    Comment: Chief Technology Officer   Tobacco Use  . Smoking status: Former Smoker    Packs/day: 1.00    Years: 39.00    Pack years: 39.00    Types: Cigarettes    Start date: 12/21/1978    Quit date: 10/16/2018    Years since quitting: 1.6  . Smokeless tobacco: Never Used  Vaping Use  . Vaping Use: Never used  Substance and Sexual Activity  . Alcohol use: Yes    Alcohol/week: 0.0 standard drinks    Comment:  occassional - approx 1 every 2 weeks   . Drug use: No  . Sexual activity: Yes    Birth control/protection: None  Other Topics Concern  . Not on file  Social History Narrative  . Not on file   Social Determinants of Health   Financial Resource Strain:   . Difficulty of Paying Living Expenses:   Food Insecurity:   . Worried About Charity fundraiser in the Last Year:   . Arboriculturist in the Last Year:   Transportation Needs:   . Film/video editor (Medical):   [name] Kitchen Lack of Transportation (Non-Medical):   Physical Activity:   . Days of Exercise per Week:   . Minutes of Exercise per Session:   Stress:   . Feeling of Stress :   Social Connections:   . Frequency of Communication with Friends and Family:   . Frequency of Social Gatherings with Friends and Family:   . Attends Religious Services:   . Active Member of Clubs or Organizations:   . Attends Archivist Meetings:   [name] Kitchen Marital Status:   Intimate Partner Violence:   . Fear of Current or Ex-Partner:   . Emotionally Abused:   [name] Kitchen Physically Abused:   . Sexually Abused:     FAMILY HISTORY:  Family History  Problem Relation Age of Onset  . Alzheimer's disease Mother   . Colon cancer Father   . Breast cancer Neg Hx     ALLERGIES:  has No Known Allergies.  MEDICATIONS:  Current Outpatient Medications  Medication Sig Dispense Refill  . acetaminophen (TYLENOL) 500 MG tablet Take 500 mg by mouth 2 (two) times daily as needed for moderate pain or headache.    Huey Bienenstock (TECENTRIQ IV) Inject 1 Dose into the vein every 30 (thirty) days.    . Cholecalciferol (DIALYVITE VITAMIN D 5000) 125 MCG (5000 UT) capsule Take 5,000 Units by mouth daily.    . diphenhydrAMINE-zinc acetate (BENADRYL EXTRA STRENGTH) cream Apply 1 application topically 3 (three) times daily as needed for itching. 28.4 g 0  . fentaNYL (DURAGESIC) 25 MCG/HR Place 1 patch onto the skin every 3 (three) days. 10 patch 0  .  HYDROcodone-acetaminophen (NORCO/VICODIN) 5-325 MG tablet Take 1 tablet by mouth every 6 (six) hours as needed for moderate pain. 90 tablet 0  . lidocaine-prilocaine (EMLA) cream Apply 1 application topically daily as needed (port access). 30 g 2  . loperamide (IMODIUM A-D) 2 MG tablet Take 2 mg by mouth 4 (four) times daily as needed for diarrhea or loose stools.    . nitroGLYCERIN (NITROSTAT) 0.4 MG SL tablet Place 1 tablet (0.4 mg total) under the tongue every 5 (five) minutes x 3 doses as needed for chest pain. 25 tablet 12  . omeprazole (PRILOSEC) 40 MG capsule Take 1 capsule (40 mg total) by mouth daily. 90 capsule 1  . polyethylene glycol (MIRALAX / GLYCOLAX) packet Take 17 g by mouth daily as needed for mild constipation.     . sertraline (ZOLOFT) 50 MG tablet Take 1.5 tablets (75 mg total) by mouth daily. 45 tablet 1  . vitamin B-12 (CYANOCOBALAMIN) 1000 MCG tablet Take 1,000 mcg by mouth daily.    . Zoledronic Acid (ZOMETA IV) Inject 1 Dose into the vein every 30 (thirty) days.     No current facility-administered medications for this visit.   Facility-Administered Medications Ordered in Other Visits  Medication Dose Route Frequency Provider Last Rate Last Admin  . heparin lock flush 100 unit/mL  500 Units Intravenous Once Earlie Server, MD      . heparin lock flush 100 unit/mL  500 Units Intravenous Once Earlie Server, MD      . sodium chloride flush (NS) 0.9 % injection 10 mL  10 mL Intravenous PRN Earlie Server, MD   10 mL at 01/09/19 0820  . sodium chloride flush (NS) 0.9 % injection 10 mL  10 mL Intravenous PRN Earlie Server, MD   10 mL at 01/10/19 1400     PHYSICAL EXAMINATION: ECOG PERFORMANCE STATUS: 1 - Symptomatic but completely ambulatory Vitals:   06/04/20 0933  BP: 103/73  Pulse: 76  Resp: 18  Temp: (!) 96.3 F (35.7 C)  SpO2: 95%   Filed Weights   06/04/20 0933  Weight: 137 lb 11.2 oz (62.5 kg)   Physical Exam Constitutional:      General: She is not in acute distress.     Appearance: She is not diaphoretic.  HENT:     Head: Normocephalic and atraumatic.     Nose: Nose normal.     Mouth/Throat:     Pharynx: No oropharyngeal exudate.  Eyes:     General: No scleral icterus.    Pupils: Pupils are equal, round, and reactive to light.  Cardiovascular:  Rate and Rhythm: Normal rate and regular rhythm.     Heart sounds: No murmur heard.   Pulmonary:     Effort: Pulmonary effort is normal. No respiratory distress.     Breath sounds: No rales.  Chest:     Chest wall: No tenderness.  Abdominal:     General: There is no distension.     Palpations: Abdomen is soft.     Tenderness: There is no abdominal tenderness.  Musculoskeletal:        General: Normal range of motion.     Cervical back: Normal range of motion and neck supple.  Skin:    General: Skin is warm and dry.     Findings: No erythema.  Neurological:     Mental Status: She is alert and oriented to person, place, and time.     Cranial Nerves: No cranial nerve deficit.     Motor: No abnormal muscle tone.     Coordination: Coordination normal.  Psychiatric:        Mood and Affect: Affect normal.        LABORATORY DATA:  I have reviewed the data as listed Lab Results  Component Value Date   WBC 5.1 06/04/2020   HGB 11.0 (L) 06/04/2020   HCT 32.5 (L) 06/04/2020   MCV 88.1 06/04/2020   PLT 183 06/04/2020   Recent Labs    04/15/20 0858 05/06/20 0858 06/04/20 0848  NA 136 133* 137  K 3.8 3.7 3.6  CL 100 100 104  CO2 25 24 24   GLUCOSE 107* 101* 105*  BUN 20 21 16   CREATININE 0.80 0.79 0.75  CALCIUM 9.3 9.0 8.8*  GFRNONAA >60 >60 >60  GFRAA >60 >60 >60  PROT 7.4 7.1 7.0  ALBUMIN 4.1 3.9 3.7  AST 13* 13* 15  ALT 12 10 11   ALKPHOS 48 44 55  BILITOT 0.8 0.5 0.5    RADIOGRAPHIC STUDIES: I have personally reviewed the radiological images as listed and agreed with the findings in the report.  CT Chest W Contrast  Result Date: 04/29/2020 CLINICAL DATA:  History of small  cell lung cancer post chemotherapy and radiation therapy. EXAM: CT CHEST, ABDOMEN, AND PELVIS WITH CONTRAST TECHNIQUE: Multidetector CT imaging of the chest, abdomen and pelvis was performed following the standard protocol during bolus administration of intravenous contrast. CONTRAST:  139mL OMNIPAQUE IOHEXOL 300 MG/ML  SOLN COMPARISON:  Prior CTs 01/19/2020 and 10/23/2019. FINDINGS: CT CHEST FINDINGS Cardiovascular: No acute vascular findings. No evidence of pulmonary embolism. Right IJ Port-A-Cath extends to the superior cavoatrial junction. There is diffuse atherosclerosis of the aorta, great vessels and coronary arteries. Probable calcifications of the aortic valve. The heart size is normal. There is no pericardial effusion. Mediastinum/Nodes: There are no enlarged mediastinal, hilar or axillary lymph nodes. The thyroid gland, trachea and esophagus demonstrate no significant findings. Lungs/Pleura: No pleural effusion or pneumothorax. Stable mild centrilobular emphysema with central airway thickening and calcified right lung granulomas. There is chronic linear scarring at both lung bases. No suspicious pulmonary nodularity. Musculoskeletal/Chest wall: Status post interval spinal augmentation at T7. Cement extends into the T6-7 and T7-8 discs. There is a new mild inferior endplate compression deformity at T4. There is also new mild sclerosis posteriorly in the C7, T1 and T2 vertebral bodies. No lytic lesions identified. There is a healing fracture of the upper sternal body which is mildly displaced. CT ABDOMEN AND PELVIS FINDINGS Hepatobiliary: The liver is normal in density without suspicious focal abnormality. No significant  biliary dilatation post cholecystectomy. Pancreas: Atrophied without ductal dilatation or surrounding inflammation. Spleen: Normal in size without focal abnormality. Adrenals/Urinary Tract: Both adrenal glands appear normal. The kidneys and ureters appear normal. No evidence of renal mass,  urinary tract calculus or hydronephrosis. Stable bladder distension and probable small cystocele. Stomach/Bowel: No evidence of bowel wall thickening, distention or surrounding inflammatory change. Several small bowel loops and a portion of the mid transverse colon protrude into a ventral hernia. No evidence of incarceration or obstruction. Vascular/Lymphatic: There are no enlarged abdominal or pelvic lymph nodes. Aortic and branch vessel atherosclerosis. No acute vascular findings. The portal, superior mesenteric and splenic veins are patent. Reproductive: The uterus and ovaries appear stable. No adnexal mass. Other: Previous ventral hernia repair with stable recurrent hernia inferior to the mesh containing small bowel and transverse colon. No ascites or peritoneal nodularity. Musculoskeletal: No acute or significant osseous findings. IMPRESSION: 1. No evidence of local recurrence of lung cancer or metastatic disease in the chest, abdomen or pelvis. 2. Status post interval spinal augmentation at T7. There is a new mild inferior endplate compression deformity at T4 and new mild sclerosis posteriorly in the C7, T1 and T2 vertebral bodies, possibly reactive or related to previous radiation. Subacute fracture of the upper sternal body. 3. Stable recurrent ventral abdominal hernia inferior to the mesh containing small bowel and transverse colon. No evidence of incarceration or obstruction. 4. Aortic Atherosclerosis (ICD10-I70.0) and Emphysema (ICD10-J43.9). Electronically Signed   By: Richardean Sale M.D.   On: 04/29/2020 12:14   MR Brain W Wo Contrast  Result Date: 04/13/2020 CLINICAL DATA:  74 year old female with history of small cell lung cancer status post prophylactic cranial radiation in May and June 2020. Restaging. EXAM: MRI HEAD WITHOUT AND WITH CONTRAST TECHNIQUE: Multiplanar, multiecho pulse sequences of the brain and surrounding structures were obtained without and with intravenous contrast. CONTRAST:   38mL GADAVIST GADOBUTROL 1 MMOL/ML IV SOLN COMPARISON:  Brain MRI 12/01/2019 and earlier. FINDINGS: Brain: No abnormal enhancement identified. No midline shift, mass effect, or evidence of intracranial mass lesion. No dural thickening. No precontrast sagittal T1 imaging is provided today. No restricted diffusion to suggest acute infarction. No ventriculomegaly, extra-axial collection or acute intracranial hemorrhage. Cervicomedullary junction and pituitary are within normal limits. Confluent bilateral cerebral white matter T2 and FLAIR hyperintensity compatible with whole brain radiation appears stable. Stable superimposed patchy T2 heterogeneity in the bilateral deep gray nuclei and pons. No cortical encephalomalacia or chronic cerebral blood products identified. Vascular: Major intracranial vascular flow voids are stable since 2019, with chronically decreased distal left vertebral artery flow void. The major dural venous sinuses are enhancing and appear to be patent. Skull and upper cervical spine: Stable and negative visible cervical spine. No suspicious marrow signal identified. Sinuses/Orbits: Negative orbits. Paranasal sinuses and mastoids remain well pneumatized. Other: Visible internal auditory structures appear normal. Scalp and face soft tissues appear negative. IMPRESSION: 1. No metastatic disease or acute intracranial abnormality. 2. 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 Electronically Signed   By: Genevie Ann M.D.   On: 04/13/2020 12:58   CT Abdomen Pelvis W Contrast  Result Date: 04/29/2020 CLINICAL DATA:  History of small cell lung cancer post chemotherapy and radiation therapy. EXAM: CT CHEST, ABDOMEN, AND PELVIS WITH CONTRAST TECHNIQUE: Multidetector CT imaging of the chest, abdomen and pelvis was performed following the standard protocol during bolus administration of intravenous  contrast. CONTRAST:  128mL OMNIPAQUE IOHEXOL 300 MG/ML  SOLN COMPARISON:  Prior CTs 01/19/2020 and 10/23/2019. FINDINGS: CT CHEST FINDINGS Cardiovascular: No acute vascular findings. No evidence of pulmonary embolism. Right IJ Port-A-Cath extends to the superior cavoatrial junction. There is diffuse atherosclerosis of the aorta, great vessels and coronary arteries. Probable calcifications of the aortic valve. The heart size is normal. There is no pericardial effusion. Mediastinum/Nodes: There are no enlarged mediastinal, hilar or axillary lymph nodes. The thyroid gland, trachea and esophagus demonstrate no significant findings. Lungs/Pleura: No pleural effusion or pneumothorax. Stable mild centrilobular emphysema with central airway thickening and calcified right lung granulomas. There is chronic linear scarring at both lung bases. No suspicious pulmonary nodularity. Musculoskeletal/Chest wall: Status post interval spinal augmentation at T7. Cement extends into the T6-7 and T7-8 discs. There is a new mild inferior endplate compression deformity at T4. There is also new mild sclerosis posteriorly in the C7, T1 and T2 vertebral bodies. No lytic lesions identified. There is a healing fracture of the upper sternal body which is mildly displaced. CT ABDOMEN AND PELVIS FINDINGS Hepatobiliary: The liver is normal in density without suspicious focal abnormality. No significant biliary dilatation post cholecystectomy. Pancreas: Atrophied without ductal dilatation or surrounding inflammation. Spleen: Normal in size without focal abnormality. Adrenals/Urinary Tract: Both adrenal glands appear normal. The kidneys and ureters appear normal. No evidence of renal mass, urinary tract calculus or hydronephrosis. Stable bladder distension and probable small cystocele. Stomach/Bowel: No evidence of bowel wall thickening, distention or surrounding inflammatory change. Several small bowel loops and a portion of the mid transverse colon  protrude into a ventral hernia. No evidence of incarceration or obstruction. Vascular/Lymphatic: There are no enlarged abdominal or pelvic lymph nodes. Aortic and branch vessel atherosclerosis. No acute vascular findings. The portal, superior mesenteric and splenic veins are patent. Reproductive: The uterus and ovaries appear stable. No adnexal mass. Other: Previous ventral hernia repair with stable recurrent hernia inferior to the mesh containing small bowel and transverse colon. No ascites or peritoneal nodularity. Musculoskeletal: No acute or significant osseous findings. IMPRESSION: 1. No evidence of local recurrence of lung cancer or metastatic disease in the chest, abdomen or pelvis. 2. Status post interval spinal augmentation at T7. There is a new mild inferior endplate compression deformity at T4 and new mild sclerosis posteriorly in the C7, T1 and T2 vertebral bodies, possibly reactive or related to previous radiation. Subacute fracture of the upper sternal body. 3. Stable recurrent ventral abdominal hernia inferior to the mesh containing small bowel and transverse colon. No evidence of incarceration or obstruction. 4. Aortic Atherosclerosis (ICD10-I70.0) and Emphysema (ICD10-J43.9). Electronically Signed   By: Richardean Sale M.D.   On: 04/29/2020 12:14     ASSESSMENT & PLAN:  1. Small cell lung cancer ([place])   2. Encounter for antineoplastic immunotherapy   3. Other fatigue   4. Thoracic compression fracture, closed, initial encounter ([place])   5. Weight loss    #Extensive small cell lung cancer S/p 4 cycles of Carboplatin, Etoposide and Tecentriq.   Status post chest and whole brain radiation.  Labs reviewed and discussed with patient. Proceed with immunotherapy with Tecentriq today.   #Fatigue, check cortisol level and TSH. #Osteopenia, continue calcium and vitamin D supplementation. Calcium is slightly low at 8.8.  Patient is going to have Zometa today.  I recommend patient to increase  calcium to 1800 mg daily.  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 #Frequent falls,  continue physical therapy #Weight loss, continue monitor.  5 pound weight loss since last visit.  Follow-up with nutritionist.  Follow up in 3 weeks for eval with next cycle of tecentriq    Earlie Server, MD, PhD

## 2020-06-04 NOTE — Telephone Encounter (Signed)
-----   Message from Earlie Server, MD sent at 06/04/2020  4:00 PM EDT ----- Looks like she has lost 5 pounds weight since last visit.  Please arrange for her to follow-up with nutritionist.  Thank you

## 2020-06-13 ENCOUNTER — Inpatient Hospital Stay: Payer: Medicare Other

## 2020-06-13 NOTE — Progress Notes (Signed)
Nutrition  Patient was a no show for nutrition appointment today.  Provider and scheduling notified to offer another appointment.  Reymundo Winship B. Zenia Resides, [place], [place] Registered Dietitian [phone] (pager)

## 2020-06-13 NOTE — Progress Notes (Signed)
Nutrition Follow-up:  Patient with small cell lung cancer followed by Dr. Tasia Catchings.  Currently on tecentriq being referred back to RD due to weight loss.    Scheduler was able to talk to patient and rescheduled for phone visit this afternoon.   Patient reports that her appetite is pretty good.  Reports that she mostly eats canned soups, flavored rice.  Does not have much taste for meat.  Likes fish and shrimp.  Likes cucumbers with ranch dressing.  Does not drink premier protein shake daily.  Reports bowels move fairly regular at times then may go 5 days with no bowel movement. Reports episodes of gagging on mucus.  Reports that nausea medications help.      Medications: reviewed  Labs: reviewed  Anthropometrics:   Weight 137 lb on 6/15 decreased from 152 lb on 04/24/2019 last seen by RD  10% weight loss in the last year, concerning   NUTRITION DIAGNOSIS: Inadequate oral intake continues   INTERVENTION:  Discussed ways to add more calories and protein into soups, rice that she is eating.   Encouraged oral nutrition supplement daily Encouraged being proactive with nausea medications. Encouraged fluids, fiber and taking bowel regimen medications to move bowels on regular basis. Patient given contact information    MONITORING, EVALUATION, GOAL: weight trends, intake   NEXT VISIT: July 29, phone call  Monya Kozakiewicz B. Zenia Resides, [place], [place] Registered Dietitian [phone] (pager)

## 2020-06-17 ENCOUNTER — Inpatient Hospital Stay: Payer: Medicare Other | Admitting: Hospice and Palliative Medicine

## 2020-06-25 ENCOUNTER — Inpatient Hospital Stay: Payer: Medicare Other

## 2020-06-25 ENCOUNTER — Inpatient Hospital Stay: Payer: Medicare Other | Attending: Oncology

## 2020-06-25 ENCOUNTER — Encounter: Payer: Self-pay | Admitting: Oncology

## 2020-06-25 ENCOUNTER — Inpatient Hospital Stay (HOSPITAL_BASED_OUTPATIENT_CLINIC_OR_DEPARTMENT_OTHER): Payer: Medicare Other | Admitting: Hospice and Palliative Medicine

## 2020-06-25 ENCOUNTER — Other Ambulatory Visit: Payer: Self-pay

## 2020-06-25 ENCOUNTER — Inpatient Hospital Stay (HOSPITAL_BASED_OUTPATIENT_CLINIC_OR_DEPARTMENT_OTHER): Payer: Medicare Other | Admitting: Oncology

## 2020-06-25 VITALS — BP 121/87 | HR 88 | Temp 96.0°F | Resp 18 | Wt 133.3 lb

## 2020-06-25 DIAGNOSIS — Z515 Encounter for palliative care: Secondary | ICD-10-CM | POA: Diagnosis not present

## 2020-06-25 DIAGNOSIS — Z5112 Encounter for antineoplastic immunotherapy: Secondary | ICD-10-CM | POA: Diagnosis not present

## 2020-06-25 DIAGNOSIS — C3490 Malignant neoplasm of unspecified part of unspecified bronchus or lung: Secondary | ICD-10-CM

## 2020-06-25 DIAGNOSIS — M8580 Other specified disorders of bone density and structure, unspecified site: Secondary | ICD-10-CM

## 2020-06-25 DIAGNOSIS — C7931 Secondary malignant neoplasm of brain: Secondary | ICD-10-CM | POA: Insufficient documentation

## 2020-06-25 DIAGNOSIS — Z95828 Presence of other vascular implants and grafts: Secondary | ICD-10-CM

## 2020-06-25 DIAGNOSIS — G893 Neoplasm related pain (acute) (chronic): Secondary | ICD-10-CM | POA: Diagnosis not present

## 2020-06-25 DIAGNOSIS — F329 Major depressive disorder, single episode, unspecified: Secondary | ICD-10-CM | POA: Diagnosis not present

## 2020-06-25 DIAGNOSIS — F32A Depression, unspecified: Secondary | ICD-10-CM

## 2020-06-25 DIAGNOSIS — S22000A Wedge compression fracture of unspecified thoracic vertebra, initial encounter for closed fracture: Secondary | ICD-10-CM

## 2020-06-25 DIAGNOSIS — Z79899 Other long term (current) drug therapy: Secondary | ICD-10-CM | POA: Diagnosis not present

## 2020-06-25 DIAGNOSIS — Z87891 Personal history of nicotine dependence: Secondary | ICD-10-CM | POA: Diagnosis not present

## 2020-06-25 DIAGNOSIS — R634 Abnormal weight loss: Secondary | ICD-10-CM

## 2020-06-25 DIAGNOSIS — R413 Other amnesia: Secondary | ICD-10-CM

## 2020-06-25 LAB — CBC WITH DIFFERENTIAL/PLATELET
Abs Immature Granulocytes: 0.03 10*3/uL (ref 0.00–0.07)
Basophils Absolute: 0 10*3/uL (ref 0.0–0.1)
Basophils Relative: 1 %
Eosinophils Absolute: 0.3 10*3/uL (ref 0.0–0.5)
Eosinophils Relative: 6 %
HCT: 33.9 % — ABNORMAL LOW (ref 36.0–46.0)
Hemoglobin: 11.8 g/dL — ABNORMAL LOW (ref 12.0–15.0)
Immature Granulocytes: 1 %
Lymphocytes Relative: 25 %
Lymphs Abs: 1.4 10*3/uL (ref 0.7–4.0)
MCH: 30.3 pg (ref 26.0–34.0)
MCHC: 34.8 g/dL (ref 30.0–36.0)
MCV: 87.1 fL (ref 80.0–100.0)
Monocytes Absolute: 0.8 10*3/uL (ref 0.1–1.0)
Monocytes Relative: 14 %
Neutro Abs: 3.1 10*3/uL (ref 1.7–7.7)
Neutrophils Relative %: 53 %
Platelets: 177 10*3/uL (ref 150–400)
RBC: 3.89 MIL/uL (ref 3.87–5.11)
RDW: 13.7 % (ref 11.5–15.5)
WBC: 5.6 10*3/uL (ref 4.0–10.5)
nRBC: 0 % (ref 0.0–0.2)

## 2020-06-25 LAB — COMPREHENSIVE METABOLIC PANEL
ALT: 10 U/L (ref 0–44)
AST: 13 U/L — ABNORMAL LOW (ref 15–41)
Albumin: 4 g/dL (ref 3.5–5.0)
Alkaline Phosphatase: 51 U/L (ref 38–126)
Anion gap: 11 (ref 5–15)
BUN: 17 mg/dL (ref 8–23)
CO2: 23 mmol/L (ref 22–32)
Calcium: 9.3 mg/dL (ref 8.9–10.3)
Chloride: 102 mmol/L (ref 98–111)
Creatinine, Ser: 0.86 mg/dL (ref 0.44–1.00)
GFR calc Af Amer: >60 mL/min (ref >60)
GFR calc non Af Amer: >60 mL/min (ref >60)
Glucose, Bld: 105 mg/dL — ABNORMAL HIGH (ref 70–99)
Potassium: 4 mmol/L (ref 3.5–5.1)
Sodium: 136 mmol/L (ref 135–145)
Total Bilirubin: 0.6 mg/dL (ref 0.3–1.2)
Total Protein: 7.4 g/dL (ref 6.5–8.1)

## 2020-06-25 MED ORDER — HEPARIN SOD (PORK) LOCK FLUSH 100 UNIT/ML IV SOLN
500.0000 [IU] | Freq: Once | INTRAVENOUS | Status: AC
  Administered 2020-06-25: 500 [IU] via INTRAVENOUS
  Filled 2020-06-25: qty 5

## 2020-06-25 MED ORDER — HYDROCODONE-ACETAMINOPHEN 5-325 MG PO TABS: 1.0000 | ORAL_TABLET | Freq: Four times a day (QID) | ORAL | 0 refills | Status: DC | PRN

## 2020-06-25 MED ORDER — SODIUM CHLORIDE 0.9 % IV SOLN
Freq: Once | INTRAVENOUS | Status: AC
  Filled 2020-06-25: qty 250

## 2020-06-25 MED ORDER — DEXAMETHASONE SODIUM PHOSPHATE 10 MG/ML IJ SOLN
10.0000 mg | Freq: Once | INTRAMUSCULAR | Status: AC
  Administered 2020-06-25: 10 mg via INTRAVENOUS
  Filled 2020-06-25: qty 1

## 2020-06-25 MED ORDER — SODIUM CHLORIDE 0.9 % IV SOLN
1200.0000 mg | Freq: Once | INTRAVENOUS | Status: AC
  Administered 2020-06-25: 1200 mg via INTRAVENOUS
  Filled 2020-06-25: qty 20

## 2020-06-25 MED ORDER — MEGESTROL ACETATE 400 MG/10ML PO SUSP
400.0000 mg | Freq: Two times a day (BID) | ORAL | 1 refills | Status: DC
Start: 2020-06-25 — End: 2020-12-05

## 2020-06-25 MED ORDER — HEPARIN SOD (PORK) LOCK FLUSH 100 UNIT/ML IV SOLN
500.0000 [IU] | Freq: Once | INTRAVENOUS | Status: DC | PRN
  Filled 2020-06-25: qty 5

## 2020-06-25 MED ORDER — HEPARIN SOD (PORK) LOCK FLUSH 100 UNIT/ML IV SOLN
INTRAVENOUS | Status: AC
  Filled 2020-06-25: qty 5

## 2020-06-25 MED ORDER — SERTRALINE HCL 50 MG PO TABS: 75.0000 mg | ORAL_TABLET | Freq: Every day | ORAL | 6 refills | Status: DC

## 2020-06-25 MED ORDER — SENNA 8.6 MG PO TABS
2.0000 | ORAL_TABLET | Freq: Every day | ORAL | 0 refills | Status: DC
Start: 2020-06-25 — End: 2020-08-29

## 2020-06-25 MED ORDER — SODIUM CHLORIDE 0.9% FLUSH
10.0000 mL | Freq: Once | INTRAVENOUS | Status: AC
  Administered 2020-06-25: 10 mL via INTRAVENOUS
  Filled 2020-06-25: qty 10

## 2020-06-25 NOTE — Progress Notes (Signed)
Patient has constipation and started Miralax and stool softener last week.  She took the Fentanyl patch off 2 days ago and did not replace because she doesn't like the way it makes her sleep all the time.  Pain is 7/10 today and last took Hydrocodone yesterday.

## 2020-06-25 NOTE — Progress Notes (Signed)
Hematology/Oncology Follow up note [facility] Telephone:([phone] Fax:([phone]   Patient Care Team: Glean Hess, MD as PCP - General (Internal Medicine) Charolette Forward, MD as Consulting Physician (Cardiology) Telford Nab, RN as Registered Nurse [name], MD as Radiation Oncologist (Radiation Oncology)  REFERRING PROVIDER: Dr. Felicie Morn REASON FOR VISIT:  Follow-up for small cell lung cancer,   HISTORY OF PRESENTING ILLNESS:  Kristi [name] is a  74 y.o.  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  10/20/2018 CT chest with contrast showed large mediastinal mass involving both hilar, left greater than right, consistent with lung carcinoma, The mass causes narrowing of the left mainstem bronchus with resultant volume loss on the left and a mediastinal shift to the left.  Moderate size left pleural effusion. 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  # MRI brain negative.  # Nov 2019- Jan 2020 s/p 4 cycles of Carbo/Etoposide/Tecentriq #  01/24/2019 interim CT scan done which showed continued positive response to therapy with continued reduction in mediastinal adenopathy.  No residual measurable left lung mass. CT findings of acute emphysematous cystitis. Urology did not feel that patient has pyelonephritis and recommend treatment with antibiotics because patient's immunocompromise. Patient finished treatment.   # Chest and whole brain radiation finished in May 2020 # 04/25/2019 resume Tecentriq every 3 weeks. # Patient had a fall from her stairs on 01/19/2020. In the emergency room she had CT chest abdomen pelvis CT, CT head without contrast, CT cervical spine without contrast. No CT evidence for acute intracranial abnormality, degenerative changes of cervical spine..  No CT evidence of acute thoracic abdomen  injury.  Severe compression fracture of T7, subacute.  # kyphoplasty procedure on 03/01/2020.  T7 biopsy negative for cancer.  INTERVAL HISTORY Kristi [name] is a 74 y.o. female who has above history reviewed by me today presents for evaluation prior to immunotherapy for treatment of extensive small cell lung cancer. Patient is on immunotherapy maintenance. Patient was accompanied by her husband today. Patient stopped fentanyl patch because of feeling of drowsiness. She reports back pain 7 out of 10, she takes Norco yesterday. Chronic fatigue is at baseline. Otherwise she has no new complaints. Also reports constipation.  She uses MiraLAX and stool softener last week with little help.  Review of Systems  Constitutional: Positive for fatigue. Negative for appetite change, chills and fever.  HENT:   Negative for hearing loss and voice change.   Eyes: Negative for eye problems.  Respiratory: Negative for chest tightness, cough and shortness of breath.   Cardiovascular: Negative for chest pain.  Gastrointestinal: Negative for abdominal distention, abdominal pain, blood in stool, diarrhea and nausea.  Endocrine: Negative for hot flashes.  Genitourinary: Negative for difficulty urinating and frequency.   Musculoskeletal: Positive for back pain. Negative for arthralgias.  Skin: Negative for itching and rash.  Neurological: Negative for extremity weakness.  Hematological: Negative for adenopathy.  Psychiatric/Behavioral: Negative for confusion and depression. The patient is not nervous/anxious.        Forgetful     MEDICAL HISTORY:  Past Medical History:  Diagnosis Date  . Acute MI, inferoposterior wall ([place]) 09/30/2014  . Claustrophobia   . Coronary artery disease   . GERD (gastroesophageal reflux disease)   . Hypercholesteremia   . Hypertension   . MI, old   . Pneumonia   . Small cell lung  cancer in adult [facility]) 10/27/2018    SURGICAL HISTORY: Past Surgical History:    Procedure Laterality Date  . APPENDECTOMY     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  . BLADDER NECK SUSPENSION    . CHOLECYSTECTOMY N/A 08/14/2018   Procedure: LAPAROSCOPIC CHOLECYSTECTOMY;  Surgeon: Jules Husbands, MD;  Location: [facility] ORS;  Service: General;  Laterality: N/A;  . CHOLECYSTECTOMY  11/2018  . CORONARY ANGIOPLASTY  09/29/2014  . CORONARY STENT PLACEMENT    . ENDOBRONCHIAL ULTRASOUND N/A 10/21/2018   Procedure: ENDOBRONCHIAL ULTRASOUND;  Surgeon: Laverle Hobby, MD;  Location: [facility] ORS;  Service: Pulmonary;  Laterality: N/A;  . HERNIA REPAIR    . IR FLUORO GUIDE CV LINE RIGHT  12/19/2018  . KYPHOPLASTY N/A 03/01/2020   Procedure: T7 KYPHOPLASTY;  Surgeon: Hessie Knows, MD;  Location: [facility] ORS;  Service: Orthopedics;  Laterality: N/A;  . LEFT HEART CATHETERIZATION WITH CORONARY ANGIOGRAM N/A 09/29/2014   Procedure: LEFT HEART CATHETERIZATION WITH CORONARY ANGIOGRAM;  Surgeon: Clent Demark, MD;  Location: [place] CATH LAB;  Service: Cardiovascular;  Laterality: N/A;  . PORTA CATH INSERTION N/A 01/02/2019   Procedure: PORTA CATH INSERTION;  Surgeon: Algernon Huxley, MD;  Location: [place] CV LAB;  Service: Cardiovascular;  Laterality: N/A;  . PORTACATH PLACEMENT Right 10/28/2018   Procedure: INSERTION PORT-A-CATH;  Surgeon: Jules Husbands, MD;  Location: [facility] ORS;  Service: General;  Laterality: Right;    SOCIAL HISTORY: Social History   Socioeconomic History  . Marital status: Married    Spouse name: [name]   . Number of children: 3  . Years of education: Not on file  . Highest education level: Not on file  Occupational History  . Occupation: Retired    Comment: Chief Technology Officer   Tobacco Use  . Smoking status: Former Smoker    Packs/day: 1.00    Years: 39.00    Pack years: 39.00    Types: Cigarettes    Start date: 12/21/1978    Quit date: 10/16/2018    Years since quitting: 1.6  . Smokeless tobacco: Never Used  Vaping Use  . Vaping Use: Never used  Substance and Sexual  Activity  . Alcohol use: Yes    Alcohol/week: 0.0 standard drinks    Comment: occassional - approx 1 every 2 weeks   . Drug use: No  . Sexual activity: Yes    Birth control/protection: None  Other Topics Concern  . Not on file  Social History Narrative  . Not on file   Social Determinants of Health   Financial Resource Strain:   . Difficulty of Paying Living Expenses:   Food Insecurity:   . Worried About Charity fundraiser in the Last Year:   . Arboriculturist in the Last Year:   Transportation Needs:   . Film/video editor (Medical):   [name] Kitchen Lack of Transportation (Non-Medical):   Physical Activity:   . Days of Exercise per Week:   . Minutes of Exercise per Session:   Stress:   . Feeling of Stress :   Social Connections:   . Frequency of Communication with Friends and Family:   . Frequency of Social Gatherings with Friends and Family:   . Attends Religious Services:   . Active Member of Clubs or Organizations:   . Attends Archivist Meetings:   [name] Kitchen Marital Status:   Intimate Partner Violence:   . Fear of Current or Ex-Partner:   . Emotionally Abused:   [name] Kitchen Physically  Abused:   . Sexually Abused:     FAMILY HISTORY: Family History  Problem Relation Age of Onset  . Alzheimer's disease Mother   . Colon cancer Father   . Breast cancer Neg Hx     ALLERGIES:  has No Known Allergies.  MEDICATIONS:  Current Outpatient Medications  Medication Sig Dispense Refill  . acetaminophen (TYLENOL) 500 MG tablet Take 500 mg by mouth 2 (two) times daily as needed for moderate pain or headache.    Huey Bienenstock (TECENTRIQ IV) Inject 1 Dose into the vein every 30 (thirty) days.    . Cholecalciferol (DIALYVITE VITAMIN D 5000) 125 MCG (5000 UT) capsule Take 5,000 Units by mouth daily.    . diphenhydrAMINE-zinc acetate (BENADRYL EXTRA STRENGTH) cream Apply 1 application topically 3 (three) times daily as needed for itching. 28.4 g 0  . HYDROcodone-acetaminophen  (NORCO/VICODIN) 5-325 MG tablet Take 1 tablet by mouth every 6 (six) hours as needed for moderate pain. 90 tablet 0  . lidocaine-prilocaine (EMLA) cream Apply 1 application topically daily as needed (port access). 30 g 2  . omeprazole (PRILOSEC) 40 MG capsule Take 1 capsule (40 mg total) by mouth daily. 90 capsule 1  . polyethylene glycol (MIRALAX / GLYCOLAX) packet Take 17 g by mouth daily as needed for mild constipation.     . sertraline (ZOLOFT) 50 MG tablet Take 1.5 tablets (75 mg total) by mouth daily. 45 tablet 1  . vitamin B-12 (CYANOCOBALAMIN) 1000 MCG tablet Take 1,000 mcg by mouth daily.    . Zoledronic Acid (ZOMETA IV) Inject 1 Dose into the vein every 30 (thirty) days.    . fentaNYL (DURAGESIC) 25 MCG/HR Place 1 patch onto the skin every 3 (three) days. (Patient not taking: Reported on 06/25/2020) 10 patch 0  . loperamide (IMODIUM A-D) 2 MG tablet Take 2 mg by mouth 4 (four) times daily as needed for diarrhea or loose stools. (Patient not taking: Reported on 06/25/2020)    . nitroGLYCERIN (NITROSTAT) 0.4 MG SL tablet Place 1 tablet (0.4 mg total) under the tongue every 5 (five) minutes x 3 doses as needed for chest pain. (Patient not taking: Reported on 06/25/2020) 25 tablet 12  . senna (SENOKOT) 8.6 MG TABS tablet Take 2 tablets (17.2 mg total) by mouth daily. 120 tablet 0   No current facility-administered medications for this visit.   Facility-Administered Medications Ordered in Other Visits  Medication Dose Route Frequency Provider Last Rate Last Admin  . heparin lock flush 100 unit/mL  500 Units Intravenous Once Earlie Server, MD      . heparin lock flush 100 unit/mL  500 Units Intravenous Once Earlie Server, MD      . heparin lock flush 100 unit/mL  500 Units Intracatheter Once PRN Earlie Server, MD      . sodium chloride flush (NS) 0.9 % injection 10 mL  10 mL Intravenous PRN Earlie Server, MD   10 mL at 01/09/19 0820  . sodium chloride flush (NS) 0.9 % injection 10 mL  10 mL Intravenous PRN Earlie Server,  MD   10 mL at 01/10/19 1400     PHYSICAL EXAMINATION: ECOG PERFORMANCE STATUS: 1 - Symptomatic but completely ambulatory Vitals:   06/25/20 0956  BP: 121/87  Pulse: 88  Resp: 18  Temp: (!) 96 F (35.6 C)   Filed Weights   06/25/20 0956  Weight: 133 lb 4.8 oz (60.5 kg)   Physical Exam Constitutional:      General: She is not in  acute distress.    Appearance: She is not diaphoretic.  HENT:     Head: Normocephalic and atraumatic.     Nose: Nose normal.     Mouth/Throat:     Pharynx: No oropharyngeal exudate.  Eyes:     General: No scleral icterus.    Pupils: Pupils are equal, round, and reactive to light.  Cardiovascular:     Rate and Rhythm: Normal rate and regular rhythm.     Heart sounds: No murmur heard.   Pulmonary:     Effort: Pulmonary effort is normal. No respiratory distress.     Breath sounds: No rales.  Chest:     Chest wall: No tenderness.  Abdominal:     General: There is no distension.     Palpations: Abdomen is soft.     Tenderness: There is no abdominal tenderness.  Musculoskeletal:        General: Normal range of motion.     Cervical back: Normal range of motion and neck supple.  Skin:    General: Skin is warm and dry.     Findings: No erythema.  Neurological:     Mental Status: She is alert and oriented to person, place, and time.     Cranial Nerves: No cranial nerve deficit.     Motor: No abnormal muscle tone.     Coordination: Coordination normal.  Psychiatric:        Mood and Affect: Affect normal.        LABORATORY DATA:  I have reviewed the data as listed Lab Results  Component Value Date   WBC 5.6 06/25/2020   HGB 11.8 (L) 06/25/2020   HCT 33.9 (L) 06/25/2020   MCV 87.1 06/25/2020   PLT 177 06/25/2020   Recent Labs    05/06/20 0858 06/04/20 0848 06/25/20 0930  NA 133* 137 136  K 3.7 3.6 4.0  CL 100 104 102  CO2 24 24 23   GLUCOSE 101* 105* 105*  BUN 21 16 17   CREATININE 0.79 0.75 0.86  CALCIUM 9.0 8.8* 9.3    GFRNONAA >60 >60 >60  GFRAA >60 >60 >60  PROT 7.1 7.0 7.4  ALBUMIN 3.9 3.7 4.0  AST 13* 15 13*  ALT 10 11 10   ALKPHOS 44 55 51  BILITOT 0.5 0.5 0.6    RADIOGRAPHIC STUDIES: I have personally reviewed the radiological images as listed and agreed with the findings in the report.  CT Chest W Contrast  Result Date: 04/29/2020 CLINICAL DATA:  History of small cell lung cancer post chemotherapy and radiation therapy. EXAM: CT CHEST, ABDOMEN, AND PELVIS WITH CONTRAST TECHNIQUE: Multidetector CT imaging of the chest, abdomen and pelvis was performed following the standard protocol during bolus administration of intravenous contrast. CONTRAST:  112mL OMNIPAQUE IOHEXOL 300 MG/ML  SOLN COMPARISON:  Prior CTs 01/19/2020 and 10/23/2019. FINDINGS: CT CHEST FINDINGS Cardiovascular: No acute vascular findings. No evidence of pulmonary embolism. Right IJ Port-A-Cath extends to the superior cavoatrial junction. There is diffuse atherosclerosis of the aorta, great vessels and coronary arteries. Probable calcifications of the aortic valve. The heart size is normal. There is no pericardial effusion. Mediastinum/Nodes: There are no enlarged mediastinal, hilar or axillary lymph nodes. The thyroid gland, trachea and esophagus demonstrate no significant findings. Lungs/Pleura: No pleural effusion or pneumothorax. Stable mild centrilobular emphysema with central airway thickening and calcified right lung granulomas. There is chronic linear scarring at both lung bases. No suspicious pulmonary nodularity. Musculoskeletal/Chest wall: Status post interval spinal augmentation at T7. Cement  extends into the T6-7 and T7-8 discs. There is a new mild inferior endplate compression deformity at T4. There is also new mild sclerosis posteriorly in the C7, T1 and T2 vertebral bodies. No lytic lesions identified. There is a healing fracture of the upper sternal body which is mildly displaced. CT ABDOMEN AND PELVIS FINDINGS Hepatobiliary:  The liver is normal in density without suspicious focal abnormality. No significant biliary dilatation post cholecystectomy. Pancreas: Atrophied without ductal dilatation or surrounding inflammation. Spleen: Normal in size without focal abnormality. Adrenals/Urinary Tract: Both adrenal glands appear normal. The kidneys and ureters appear normal. No evidence of renal mass, urinary tract calculus or hydronephrosis. Stable bladder distension and probable small cystocele. Stomach/Bowel: No evidence of bowel wall thickening, distention or surrounding inflammatory change. Several small bowel loops and a portion of the mid transverse colon protrude into a ventral hernia. No evidence of incarceration or obstruction. Vascular/Lymphatic: There are no enlarged abdominal or pelvic lymph nodes. Aortic and branch vessel atherosclerosis. No acute vascular findings. The portal, superior mesenteric and splenic veins are patent. Reproductive: The uterus and ovaries appear stable. No adnexal mass. Other: Previous ventral hernia repair with stable recurrent hernia inferior to the mesh containing small bowel and transverse colon. No ascites or peritoneal nodularity. Musculoskeletal: No acute or significant osseous findings. IMPRESSION: 1. No evidence of local recurrence of lung cancer or metastatic disease in the chest, abdomen or pelvis. 2. Status post interval spinal augmentation at T7. There is a new mild inferior endplate compression deformity at T4 and new mild sclerosis posteriorly in the C7, T1 and T2 vertebral bodies, possibly reactive or related to previous radiation. Subacute fracture of the upper sternal body. 3. Stable recurrent ventral abdominal hernia inferior to the mesh containing small bowel and transverse colon. No evidence of incarceration or obstruction. 4. Aortic Atherosclerosis (ICD10-I70.0) and Emphysema (ICD10-J43.9). Electronically Signed   By: Richardean Sale M.D.   On: 04/29/2020 12:14   MR Brain W Wo  Contrast  Result Date: 04/13/2020 CLINICAL DATA:  74 year old female with history of small cell lung cancer status post prophylactic cranial radiation in May and June 2020. Restaging. EXAM: MRI HEAD WITHOUT AND WITH CONTRAST TECHNIQUE: Multiplanar, multiecho pulse sequences of the brain and surrounding structures were obtained without and with intravenous contrast. CONTRAST:  49mL GADAVIST GADOBUTROL 1 MMOL/ML IV SOLN COMPARISON:  Brain MRI 12/01/2019 and earlier. FINDINGS: Brain: No abnormal enhancement identified. No midline shift, mass effect, or evidence of intracranial mass lesion. No dural thickening. No precontrast sagittal T1 imaging is provided today. No restricted diffusion to suggest acute infarction. No ventriculomegaly, extra-axial collection or acute intracranial hemorrhage. Cervicomedullary junction and pituitary are within normal limits. Confluent bilateral cerebral white matter T2 and FLAIR hyperintensity compatible with whole brain radiation appears stable. Stable superimposed patchy T2 heterogeneity in the bilateral deep gray nuclei and pons. No cortical encephalomalacia or chronic cerebral blood products identified. Vascular: Major intracranial vascular flow voids are stable since 2019, with chronically decreased distal left vertebral artery flow void. The major dural venous sinuses are enhancing and appear to be patent. Skull and upper cervical spine: Stable and negative visible cervical spine. No suspicious marrow signal identified. Sinuses/Orbits: Negative orbits. Paranasal sinuses and mastoids remain well pneumatized. Other: Visible internal auditory structures appear normal. Scalp and face soft tissues appear negative. IMPRESSION: 1. No metastatic disease or acute intracranial abnormality. 2. 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  Electronically Signed   By: Genevie Ann M.D.    On: 04/13/2020 12:58   CT Abdomen Pelvis W Contrast  Result Date: 04/29/2020 CLINICAL DATA:  History of small cell lung cancer post chemotherapy and radiation therapy. EXAM: CT CHEST, ABDOMEN, AND PELVIS WITH CONTRAST TECHNIQUE: Multidetector CT imaging of the chest, abdomen and pelvis was performed following the standard protocol during bolus administration of intravenous contrast. CONTRAST:  161mL OMNIPAQUE IOHEXOL 300 MG/ML  SOLN COMPARISON:  Prior CTs 01/19/2020 and 10/23/2019. FINDINGS: CT CHEST FINDINGS Cardiovascular: No acute vascular findings. No evidence of pulmonary embolism. Right IJ Port-A-Cath extends to the superior cavoatrial junction. There is diffuse atherosclerosis of the aorta, great vessels and coronary arteries. Probable calcifications of the aortic valve. The heart size is normal. There is no pericardial effusion. Mediastinum/Nodes: There are no enlarged mediastinal, hilar or axillary lymph nodes. The thyroid gland, trachea and esophagus demonstrate no significant findings. Lungs/Pleura: No pleural effusion or pneumothorax. Stable mild centrilobular emphysema with central airway thickening and calcified right lung granulomas. There is chronic linear scarring at both lung bases. No suspicious pulmonary nodularity. Musculoskeletal/Chest wall: Status post interval spinal augmentation at T7. Cement extends into the T6-7 and T7-8 discs. There is a new mild inferior endplate compression deformity at T4. There is also new mild sclerosis posteriorly in the C7, T1 and T2 vertebral bodies. No lytic lesions identified. There is a healing fracture of the upper sternal body which is mildly displaced. CT ABDOMEN AND PELVIS FINDINGS Hepatobiliary: The liver is normal in density without suspicious focal abnormality. No significant biliary dilatation post cholecystectomy. Pancreas: Atrophied without ductal dilatation or surrounding inflammation. Spleen: Normal in size without focal abnormality.  Adrenals/Urinary Tract: Both adrenal glands appear normal. The kidneys and ureters appear normal. No evidence of renal mass, urinary tract calculus or hydronephrosis. Stable bladder distension and probable small cystocele. Stomach/Bowel: No evidence of bowel wall thickening, distention or surrounding inflammatory change. Several small bowel loops and a portion of the mid transverse colon protrude into a ventral hernia. No evidence of incarceration or obstruction. Vascular/Lymphatic: There are no enlarged abdominal or pelvic lymph nodes. Aortic and branch vessel atherosclerosis. No acute vascular findings. The portal, superior mesenteric and splenic veins are patent. Reproductive: The uterus and ovaries appear stable. No adnexal mass. Other: Previous ventral hernia repair with stable recurrent hernia inferior to the mesh containing small bowel and transverse colon. No ascites or peritoneal nodularity. Musculoskeletal: No acute or significant osseous findings. IMPRESSION: 1. No evidence of local recurrence of lung cancer or metastatic disease in the chest, abdomen or pelvis. 2. Status post interval spinal augmentation at T7. There is a new mild inferior endplate compression deformity at T4 and new mild sclerosis posteriorly in the C7, T1 and T2 vertebral bodies, possibly reactive or related to previous radiation. Subacute fracture of the upper sternal body. 3. Stable recurrent ventral abdominal hernia inferior to the mesh containing small bowel and transverse colon. No evidence of incarceration or obstruction. 4. Aortic Atherosclerosis (ICD10-I70.0) and Emphysema (ICD10-J43.9). Electronically Signed   By: Richardean Sale M.D.   On: 04/29/2020 12:14     ASSESSMENT & PLAN:  1. Small cell lung cancer ([place])   2. Encounter for antineoplastic immunotherapy   3. Thoracic compression fracture, closed, initial encounter ([place])   4. Weight loss   5. Osteopenia, unspecified location    #Extensive small cell lung  cancer S/p 4 cycles of Carboplatin, Etoposide and Tecentriq.   Status post chest and whole brain radiation.  Labs reviewed and discussed with patient. Counts acceptable to proceed with Tecentriq maintenance today. TSH has been monitored and has been normal.  Slightly increased T4 level. Plan to repeat CT and MRI brain in August.  #Back pain secondary to thoracic compression fracture.  Continue Norco every 6 hours as needed.  Patient prefers to stay off fentanyl patch due to drowsiness. #Osteopenia continue calcium and vitamin D supplementation. Calcium has improved. She has been taking calcium 3 times daily.  I recommend patient to decrease to 2 times daily. Patient also gets Zometa every 6 weeks due to stage IV lung cancer.  #Memory loss, due to radiation. #Falls, continue physical therapy. #Weight loss/cachexia, she is losing more weight.  Advised patient to continue nutrition supplementation. I will add appetite stimulant with Megace 400 mg twice daily.  Prescription was sent to pharmacy.  Patient is made aware.  Follow up in 3 weeks for eval with next cycle of tecentriq    Earlie Server, MD, PhD

## 2020-06-25 NOTE — Progress Notes (Signed)
[place]  [phone] [phone]   Name: Kristi [name] [facility] Date: 06/25/2020 MRN: [id number]  DOB: 1946/03/02  Patient Care Team: Glean Hess, MD as PCP - General (Internal Medicine) Charolette Forward, MD as Consulting Physician (Cardiology) Telford Nab, RN as Registered Nurse [name], MD as Radiation Oncologist (Radiation Oncology)    REASON FOR CONSULTATION: Kristi [name] is a 74 y.o. 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  PMH also notable for CAD with history of acute MI, claustrophobia, hypertension, and hyperlipidemia.    Patient status post chemo on maintenance immunotherapy.  Palliative care was asked to help address goals and symptoms.  SOCIAL HISTORY:    Patient is married and lives at home with her husband of 18 years.  They moved here from [place], [place] around 4 years ago.  Patient has 2 sons and a daughter.  One son lives in [place] and the other 2 children are in [place].  Patient used to work for a [employer] and then later for the local [place].  ADVANCE DIRECTIVES:  Not on file  CODE STATUS: DNR (MOST form completed on 11/24/18)  PAST MEDICAL HISTORY: Past Medical History:  Diagnosis Date  . Acute MI, inferoposterior wall ([place]) 09/30/2014  . Claustrophobia   . Coronary artery disease   . GERD (gastroesophageal reflux disease)   . Hypercholesteremia   . Hypertension   . MI, old   . Pneumonia   . 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 [facility]) 10/27/2018    PAST SURGICAL HISTORY:  Past Surgical History:  Procedure Laterality Date  . APPENDECTOMY     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  . BLADDER NECK SUSPENSION    . CHOLECYSTECTOMY N/A 08/14/2018   Procedure: LAPAROSCOPIC CHOLECYSTECTOMY;  Surgeon: Jules Husbands, MD;  Location: [facility] ORS;  Service: General;   Laterality: N/A;  . CHOLECYSTECTOMY  11/2018  . CORONARY ANGIOPLASTY  09/29/2014  . CORONARY STENT PLACEMENT    . ENDOBRONCHIAL ULTRASOUND N/A 10/21/2018   Procedure: ENDOBRONCHIAL ULTRASOUND;  Surgeon: Laverle Hobby, MD;  Location: [facility] ORS;  Service: Pulmonary;  Laterality: N/A;  . HERNIA REPAIR    . IR FLUORO GUIDE CV LINE RIGHT  12/19/2018  . KYPHOPLASTY N/A 03/01/2020   Procedure: T7 KYPHOPLASTY;  Surgeon: Hessie Knows, MD;  Location: [facility] ORS;  Service: Orthopedics;  Laterality: N/A;  . LEFT HEART CATHETERIZATION WITH CORONARY ANGIOGRAM N/A 09/29/2014   Procedure: LEFT HEART CATHETERIZATION WITH CORONARY ANGIOGRAM;  Surgeon: Clent Demark, MD;  Location: [facility] CATH LAB;  Service: Cardiovascular;  Laterality: N/A;  . PORTA CATH INSERTION N/A 01/02/2019   Procedure: PORTA CATH INSERTION;  Surgeon: Algernon Huxley, MD;  Location: [facility] CV LAB;  Service: Cardiovascular;  Laterality: N/A;  . PORTACATH PLACEMENT Right 10/28/2018   Procedure: INSERTION PORT-A-CATH;  Surgeon: Jules Husbands, MD;  Location: [facility] ORS;  Service: General;  Laterality: Right;    HEMATOLOGY/ONCOLOGY HISTORY:  Oncology History Overview Note  KRISTEE ANGUS is a  74 y.o.  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   10/20/2018 CT chest with contrast showed large mediastinal mass involving both hilar, left greater than right, consistent with lung carcinoma, The mass causes narrowing of the left mainstem bronchus with resultant volume loss on the left and a mediastinal shift to the  left.  Moderate size left pleural effusion. 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   # Nov 2019- Jan 2020 s/p 4 cycles of Carbo/Etoposide/Tecentriq #  01/24/2019 interim CT scan done which showed continued positive response to therapy with continued reduction in mediastinal adenopathy.  No  residual measurable left lung mass. CT findings of acute emphysematous cystitis. Urology did not feel that patient has pyelonephritis and recommend treatment with antibiotics because patient's immunocompromise. Patient finished treatment.    # Chest and whole brain radiation finished in May 2020 # 04/25/2019 resume Tecentriq every 3 weeks.   Small cell lung cancer ([place])  10/27/2018 Initial Diagnosis   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 [facility])   11/02/2018 -  Chemotherapy   The patient had palonosetron (ALOXI) injection 0.25 mg, 0.25 mg, Intravenous,  Once, 4 of 4 cycles Administration: 0.25 mg (11/02/2018), 0.25 mg (11/23/2018), 0.25 mg (12/19/2018), 0.25 mg (01/09/2019) pegfilgrastim-cbqv (UDENYCA) injection 6 mg, 6 mg, Subcutaneous, Once, 4 of 4 cycles Administration: 6 mg (11/07/2018), 6 mg (11/28/2018), 6 mg (12/23/2018), 6 mg (01/12/2019) CARBOplatin (PARAPLATIN) 390 mg in sodium chloride 0.9 % 250 mL chemo infusion, 390 mg (100 % of original dose 394.5 mg), Intravenous,  Once, 4 of 4 cycles Dose modification:   (original dose 394.5 mg, Cycle 1) Administration: 390 mg (11/02/2018), 390 mg (11/23/2018), 390 mg (12/19/2018), 390 mg (01/09/2019) etoposide (VEPESID) 180 mg in sodium chloride 0.9 % 500 mL chemo infusion, 100 mg/m2 = 180 mg, Intravenous,  Once, 4 of 4 cycles Administration: 180 mg (11/02/2018), 180 mg (11/03/2018), 180 mg (11/04/2018), 180 mg (11/23/2018), 180 mg (11/24/2018), 180 mg (11/25/2018), 180 mg (12/19/2018), 180 mg (12/20/2018), 180 mg (12/22/2018), 180 mg (01/09/2019), 180 mg (01/10/2019), 180 mg (01/11/2019) fosaprepitant (EMEND) 150 mg, dexamethasone (DECADRON) 12 mg in sodium chloride 0.9 % 145 mL IVPB, , Intravenous,  Once, 4 of 4 cycles Administration:  (11/02/2018),  (11/23/2018),  (12/19/2018),  (01/09/2019) atezolizumab (TECENTRIQ) 1,200 mg in sodium chloride 0.9 % 250 mL chemo infusion, 1,200 mg, Intravenous, Once, 23 of 28 cycles Administration: 1,200 mg (11/23/2018), 1,200 mg  (12/19/2018), 1,200 mg (01/09/2019), 1,200 mg (04/24/2019), 1,200 mg (05/18/2019), 1,200 mg (06/15/2019), 1,200 mg (07/13/2019), 1,200 mg (08/03/2019), 1,200 mg (08/24/2019), 1,200 mg (09/14/2019), 1,200 mg (10/05/2019), 1,200 mg (10/26/2019), 1,200 mg (01/01/2020), 1,200 mg (12/11/2019), 1,200 mg (11/20/2019), 1,200 mg (01/22/2020), 1,200 mg (02/12/2020), 1,200 mg (03/04/2020), 1,200 mg (03/25/2020), 1,200 mg (04/15/2020), 1,200 mg (05/06/2020), 1,200 mg (06/04/2020), 1,200 mg (06/25/2020)  for chemotherapy treatment.      ALLERGIES:  has No Known Allergies.  MEDICATIONS:  Current Outpatient Medications  Medication Sig Dispense Refill  . acetaminophen (TYLENOL) 500 MG tablet Take 500 mg by mouth 2 (two) times daily as needed for moderate pain or headache.    Huey Bienenstock (TECENTRIQ IV) Inject 1 Dose into the vein every 30 (thirty) days.    . Cholecalciferol (DIALYVITE VITAMIN D 5000) 125 MCG (5000 UT) capsule Take 5,000 Units by mouth daily.    . diphenhydrAMINE-zinc acetate (BENADRYL EXTRA STRENGTH) cream Apply 1 application topically 3 (three) times daily as needed for itching. 28.4 g 0  . HYDROcodone-acetaminophen (NORCO/VICODIN) 5-325 MG tablet Take 1 tablet by mouth every 6 (six) hours as needed for moderate pain. 90 tablet 0  . lidocaine-prilocaine (EMLA) cream Apply 1 application topically daily as needed (port access). 30 g 2  . loperamide (IMODIUM A-D) 2 MG tablet Take 2 mg by mouth 4 (four) times daily  as needed for diarrhea or loose stools. (Patient not taking: Reported on 06/25/2020)    . nitroGLYCERIN (NITROSTAT) 0.4 MG SL tablet Place 1 tablet (0.4 mg total) under the tongue every 5 (five) minutes x 3 doses as needed for chest pain. (Patient not taking: Reported on 06/25/2020) 25 tablet 12  . omeprazole (PRILOSEC) 40 MG capsule Take 1 capsule (40 mg total) by mouth daily. 90 capsule 1  . polyethylene glycol (MIRALAX / GLYCOLAX) packet Take 17 g by mouth daily as needed for mild constipation.     . senna  (SENOKOT) 8.6 MG TABS tablet Take 2 tablets (17.2 mg total) by mouth daily. 120 tablet 0  . sertraline (ZOLOFT) 50 MG tablet Take 1.5 tablets (75 mg total) by mouth daily. 45 tablet 6  . vitamin B-12 (CYANOCOBALAMIN) 1000 MCG tablet Take 1,000 mcg by mouth daily.    . Zoledronic Acid (ZOMETA IV) Inject 1 Dose into the vein every 30 (thirty) days.     No current facility-administered medications for this visit.   Facility-Administered Medications Ordered in Other Visits  Medication Dose Route Frequency Provider Last Rate Last Admin  . heparin lock flush 100 unit/mL  500 Units Intravenous Once Earlie Server, MD      . heparin lock flush 100 unit/mL  500 Units Intravenous Once Earlie Server, MD      . sodium chloride flush (NS) 0.9 % injection 10 mL  10 mL Intravenous PRN Earlie Server, MD   10 mL at 01/09/19 0820  . sodium chloride flush (NS) 0.9 % injection 10 mL  10 mL Intravenous PRN Earlie Server, MD   10 mL at 01/10/19 1400    VITAL SIGNS: There were no vitals taken for this visit. There were no vitals filed for this visit.  Estimated body mass index is 20.88 kg/m as calculated from the following:   Height as of 02/28/20: 5\' 7"  (1.702 m).   Weight as of an earlier encounter on 06/25/20: 133 lb 4.8 oz (60.5 kg).  LABS: CBC:    Component Value Date/Time   WBC 5.6 06/25/2020 0930   HGB 11.8 (L) 06/25/2020 0930   HGB 14.5 09/19/2018 1038   HCT 33.9 (L) 06/25/2020 0930   HCT 42.7 09/19/2018 1038   PLT 177 06/25/2020 0930   PLT 265 09/19/2018 1038   MCV 87.1 06/25/2020 0930   MCV 91 09/19/2018 1038   NEUTROABS 3.1 06/25/2020 0930   NEUTROABS 5.1 09/19/2018 1038   LYMPHSABS 1.4 06/25/2020 0930   LYMPHSABS 2.0 09/19/2018 1038   MONOABS 0.8 06/25/2020 0930   EOSABS 0.3 06/25/2020 0930   EOSABS 0.3 09/19/2018 1038   BASOSABS 0.0 06/25/2020 0930   BASOSABS 0.1 09/19/2018 1038   Comprehensive Metabolic Panel:    Component Value Date/Time   NA 136 06/25/2020 0930   NA 136 09/19/2018 1038   K 4.0  06/25/2020 0930   CL 102 06/25/2020 0930   CO2 23 06/25/2020 0930   BUN 17 06/25/2020 0930   BUN 6 (L) 09/19/2018 1038   CREATININE 0.86 06/25/2020 0930   GLUCOSE 105 (H) 06/25/2020 0930   CALCIUM 9.3 06/25/2020 0930   AST 13 (L) 06/25/2020 0930   ALT 10 06/25/2020 0930   ALKPHOS 51 06/25/2020 0930   BILITOT 0.6 06/25/2020 0930   BILITOT 0.5 09/19/2018 1038   PROT 7.4 06/25/2020 0930   PROT 7.4 09/19/2018 1038   ALBUMIN 4.0 06/25/2020 0930   ALBUMIN 4.3 09/19/2018 1038    RADIOGRAPHIC STUDIES: No results  found.  PERFORMANCE STATUS (ECOG) : 2-3  Review of Systems Unless otherwise noted, a complete review of systems is negative.  Physical Exam General: NAD, frail appearing, thin Pulmonary: Unlabored Extremities: no edema, no joint deformities Skin: no rashes Neurological: Weakness but otherwise nonfocal  IMPRESSION: Routine follow-up visit today.  Patient was seen in the infusion area.   She reports that she is doing well.  She denies any significant changes or concerns.  She is symptomatically stable.  She continues to endorse chronic lower back pain for which she takes Norco every 6 hours as needed.  She does request a refill of Norco today.  Patient reports stable mood/depression.  She continues to tolerate sertraline at present dose of 75 mg/day.  Patient request a refill of this.  Patient has had slow decline in weight over the past 2 months.  We discussed the importance of high-calorie/high-protein foods.  She is being followed by nutrition.  She is occasionally drinking oral supplements.  I encouraged her to increase consumption of oral supplements to once or twice daily.  Could consider appetite stimulant if needed.  PLAN: -Continue current scope of treatment -Continue Norco 5-25mg  Q66H PRN for breakthrough pain (refilled today) -Continue sertraline 75mg  daily (refilled today) -RTC in 3 weeks   Patient expressed understanding and was in agreement with this  plan. She also understands that She can call the clinic at any time with any questions, concerns, or complaints.     Time Total: 15 minutes  Visit consisted of counseling and education dealing with the complex and emotionally intense issues of symptom management and palliative care in the setting of serious and potentially life-threatening illness.Greater than 50%  of this time was spent counseling and coordinating care related to the above assessment and plan.  Signed by: Altha Harm, PhD, NP-C [phone] (Work Cell)

## 2020-07-16 ENCOUNTER — Inpatient Hospital Stay (HOSPITAL_BASED_OUTPATIENT_CLINIC_OR_DEPARTMENT_OTHER): Payer: Medicare Other | Admitting: Hospice and Palliative Medicine

## 2020-07-16 ENCOUNTER — Inpatient Hospital Stay: Payer: Medicare Other

## 2020-07-16 ENCOUNTER — Other Ambulatory Visit: Payer: Self-pay

## 2020-07-16 ENCOUNTER — Encounter: Payer: Self-pay | Admitting: Oncology

## 2020-07-16 ENCOUNTER — Inpatient Hospital Stay (HOSPITAL_BASED_OUTPATIENT_CLINIC_OR_DEPARTMENT_OTHER): Payer: Medicare Other | Admitting: Oncology

## 2020-07-16 VITALS — Resp 20

## 2020-07-16 VITALS — BP 106/80 | HR 84 | Temp 97.4°F | Wt 134.8 lb

## 2020-07-16 DIAGNOSIS — Z5112 Encounter for antineoplastic immunotherapy: Secondary | ICD-10-CM

## 2020-07-16 DIAGNOSIS — M8580 Other specified disorders of bone density and structure, unspecified site: Secondary | ICD-10-CM

## 2020-07-16 DIAGNOSIS — R413 Other amnesia: Secondary | ICD-10-CM

## 2020-07-16 DIAGNOSIS — Z95828 Presence of other vascular implants and grafts: Secondary | ICD-10-CM

## 2020-07-16 DIAGNOSIS — C3490 Malignant neoplasm of unspecified part of unspecified bronchus or lung: Secondary | ICD-10-CM | POA: Diagnosis not present

## 2020-07-16 DIAGNOSIS — C7931 Secondary malignant neoplasm of brain: Secondary | ICD-10-CM | POA: Diagnosis not present

## 2020-07-16 DIAGNOSIS — Z79899 Other long term (current) drug therapy: Secondary | ICD-10-CM | POA: Diagnosis not present

## 2020-07-16 DIAGNOSIS — S22000A Wedge compression fracture of unspecified thoracic vertebra, initial encounter for closed fracture: Secondary | ICD-10-CM

## 2020-07-16 DIAGNOSIS — Z87891 Personal history of nicotine dependence: Secondary | ICD-10-CM | POA: Diagnosis not present

## 2020-07-16 DIAGNOSIS — Z515 Encounter for palliative care: Secondary | ICD-10-CM

## 2020-07-16 DIAGNOSIS — E860 Dehydration: Secondary | ICD-10-CM

## 2020-07-16 DIAGNOSIS — R5383 Other fatigue: Secondary | ICD-10-CM

## 2020-07-16 LAB — COMPREHENSIVE METABOLIC PANEL
ALT: 9 U/L (ref 0–44)
AST: 14 U/L — ABNORMAL LOW (ref 15–41)
Albumin: 4 g/dL (ref 3.5–5.0)
Alkaline Phosphatase: 43 U/L (ref 38–126)
Anion gap: 8 (ref 5–15)
BUN: 16 mg/dL (ref 8–23)
CO2: 24 mmol/L (ref 22–32)
Calcium: 8.9 mg/dL (ref 8.9–10.3)
Chloride: 104 mmol/L (ref 98–111)
Creatinine, Ser: 0.76 mg/dL (ref 0.44–1.00)
GFR calc Af Amer: >60 mL/min (ref >60)
GFR calc non Af Amer: >60 mL/min (ref >60)
Glucose, Bld: 103 mg/dL — ABNORMAL HIGH (ref 70–99)
Potassium: 4 mmol/L (ref 3.5–5.1)
Sodium: 136 mmol/L (ref 135–145)
Total Bilirubin: 0.6 mg/dL (ref 0.3–1.2)
Total Protein: 7.2 g/dL (ref 6.5–8.1)

## 2020-07-16 LAB — CBC WITH DIFFERENTIAL/PLATELET
Abs Immature Granulocytes: 0.03 10*3/uL (ref 0.00–0.07)
Basophils Absolute: 0 10*3/uL (ref 0.0–0.1)
Basophils Relative: 1 %
Eosinophils Absolute: 0.3 10*3/uL (ref 0.0–0.5)
Eosinophils Relative: 5 %
HCT: 34.5 % — ABNORMAL LOW (ref 36.0–46.0)
Hemoglobin: 11.7 g/dL — ABNORMAL LOW (ref 12.0–15.0)
Immature Granulocytes: 1 %
Lymphocytes Relative: 22 %
Lymphs Abs: 1.2 10*3/uL (ref 0.7–4.0)
MCH: 30 pg (ref 26.0–34.0)
MCHC: 33.9 g/dL (ref 30.0–36.0)
MCV: 88.5 fL (ref 80.0–100.0)
Monocytes Absolute: 0.9 10*3/uL (ref 0.1–1.0)
Monocytes Relative: 16 %
Neutro Abs: 3.1 10*3/uL (ref 1.7–7.7)
Neutrophils Relative %: 55 %
Platelets: 166 10*3/uL (ref 150–400)
RBC: 3.9 MIL/uL (ref 3.87–5.11)
RDW: 13.7 % (ref 11.5–15.5)
WBC: 5.5 10*3/uL (ref 4.0–10.5)
nRBC: 0 % (ref 0.0–0.2)

## 2020-07-16 LAB — TSH: TSH: 0.908 u[IU]/mL (ref 0.350–4.500)

## 2020-07-16 LAB — T4, FREE: Free T4: 0.91 ng/dL (ref 0.61–1.12)

## 2020-07-16 MED ORDER — HEPARIN SOD (PORK) LOCK FLUSH 100 UNIT/ML IV SOLN
500.0000 [IU] | Freq: Once | INTRAVENOUS | Status: AC | PRN
  Administered 2020-07-16: 500 [IU]
  Filled 2020-07-16: qty 5

## 2020-07-16 MED ORDER — DEXAMETHASONE SODIUM PHOSPHATE 10 MG/ML IJ SOLN
10.0000 mg | Freq: Once | INTRAMUSCULAR | Status: AC
  Administered 2020-07-16: 10 mg via INTRAVENOUS
  Filled 2020-07-16: qty 1

## 2020-07-16 MED ORDER — HEPARIN SOD (PORK) LOCK FLUSH 100 UNIT/ML IV SOLN
INTRAVENOUS | Status: AC
  Filled 2020-07-16: qty 5

## 2020-07-16 MED ORDER — SODIUM CHLORIDE 0.9 % IV SOLN
Freq: Once | INTRAVENOUS | Status: AC
  Filled 2020-07-16: qty 250

## 2020-07-16 MED ORDER — SODIUM CHLORIDE 0.9% FLUSH
10.0000 mL | INTRAVENOUS | Status: DC | PRN
  Administered 2020-07-16: 10 mL via INTRAVENOUS
  Filled 2020-07-16: qty 10

## 2020-07-16 MED ORDER — ZOLEDRONIC ACID 4 MG/100ML IV SOLN
4.0000 mg | Freq: Once | INTRAVENOUS | Status: AC
  Administered 2020-07-16: 4 mg via INTRAVENOUS
  Filled 2020-07-16: qty 100

## 2020-07-16 MED ORDER — SODIUM CHLORIDE 0.9 % IV SOLN
1200.0000 mg | Freq: Once | INTRAVENOUS | Status: AC
  Administered 2020-07-16: 1200 mg via INTRAVENOUS
  Filled 2020-07-16: qty 20

## 2020-07-16 NOTE — Progress Notes (Signed)
Hematology/Oncology Follow up note [facility] Telephone:([phone] Fax:([phone]   Patient Care Team: Glean Hess, MD as PCP - General (Internal Medicine) Charolette Forward, MD as Consulting Physician (Cardiology) Telford Nab, RN as Registered Nurse [name], MD as Radiation Oncologist (Radiation Oncology)  REFERRING PROVIDER: Dr. Felicie Morn REASON FOR VISIT:  Follow-up for small cell lung cancer,   HISTORY OF PRESENTING ILLNESS:  Kristi [name] is a  74 y.o.  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  10/20/2018 CT chest with contrast showed large mediastinal mass involving both hilar, left greater than right, consistent with lung carcinoma, The mass causes narrowing of the left mainstem bronchus with resultant volume loss on the left and a mediastinal shift to the left.  Moderate size left pleural effusion. 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  # MRI brain negative.  # Nov 2019- Jan 2020 s/p 4 cycles of Carbo/Etoposide/Tecentriq #  01/24/2019 interim CT scan done which showed continued positive response to therapy with continued reduction in mediastinal adenopathy.  No residual measurable left lung mass. CT findings of acute emphysematous cystitis. Urology did not feel that patient has pyelonephritis and recommend treatment with antibiotics because patient's immunocompromise. Patient finished treatment.   # Chest and whole brain radiation finished in May 2020 # 04/25/2019 resume Tecentriq every 3 weeks. # Patient had a fall from her stairs on 01/19/2020. In the emergency room she had CT chest abdomen pelvis CT, CT head without contrast, CT cervical spine without contrast. No CT evidence for acute intracranial abnormality, degenerative changes of cervical spine..  No CT evidence of acute thoracic abdomen  injury.  Severe compression fracture of T7, subacute.  # kyphoplasty procedure on 03/01/2020.  T7 biopsy negative for cancer.  INTERVAL HISTORY Kristi [name] is a 74 y.o. female who has above history reviewed by me today presents for evaluation prior to immunotherapy for treatment of extensive small cell lung cancer. Patient is on immunotherapy maintenance. Patient was accompanied by her husband today. Patient feels that her back pain is on and off, she takes Norco if needed. Chronic fatigue is at baseline. Constipation is better. She has not tried the Megace yet. She has gained 2 pounds since last visit .[name] Kitchen  Review of Systems  Constitutional: Positive for fatigue. Negative for appetite change, chills and fever.  HENT:   Negative for hearing loss and voice change.   Eyes: Negative for eye problems.  Respiratory: Negative for chest tightness, cough and shortness of breath.   Cardiovascular: Negative for chest pain.  Gastrointestinal: Negative for abdominal distention, abdominal pain, blood in stool, diarrhea and nausea.  Endocrine: Negative for hot flashes.  Genitourinary: Negative for difficulty urinating and frequency.   Musculoskeletal: Positive for back pain. Negative for arthralgias.  Skin: Negative for itching and rash.  Neurological: Negative for extremity weakness.  Hematological: Negative for adenopathy.  Psychiatric/Behavioral: Negative for confusion and depression. The patient is not nervous/anxious.        Forgetful     MEDICAL HISTORY:  Past Medical History:  Diagnosis Date  . Acute MI, inferoposterior wall ([place]) 09/30/2014  . Claustrophobia   . Coronary artery disease   . GERD (gastroesophageal reflux disease)   . Hypercholesteremia   . Hypertension   . MI, old   . Pneumonia   . 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 [facility]) 10/27/2018  SURGICAL HISTORY: Past Surgical History:  Procedure Laterality Date  . APPENDECTOMY     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  .  BLADDER NECK SUSPENSION    . CHOLECYSTECTOMY N/A 08/14/2018   Procedure: LAPAROSCOPIC CHOLECYSTECTOMY;  Surgeon: Jules Husbands, MD;  Location: [facility] ORS;  Service: General;  Laterality: N/A;  . CHOLECYSTECTOMY  11/2018  . CORONARY ANGIOPLASTY  09/29/2014  . CORONARY STENT PLACEMENT    . ENDOBRONCHIAL ULTRASOUND N/A 10/21/2018   Procedure: ENDOBRONCHIAL ULTRASOUND;  Surgeon: Laverle Hobby, MD;  Location: [facility] ORS;  Service: Pulmonary;  Laterality: N/A;  . HERNIA REPAIR    . IR FLUORO GUIDE CV LINE RIGHT  12/19/2018  . KYPHOPLASTY N/A 03/01/2020   Procedure: T7 KYPHOPLASTY;  Surgeon: Hessie Knows, MD;  Location: [facility] ORS;  Service: Orthopedics;  Laterality: N/A;  . LEFT HEART CATHETERIZATION WITH CORONARY ANGIOGRAM N/A 09/29/2014   Procedure: LEFT HEART CATHETERIZATION WITH CORONARY ANGIOGRAM;  Surgeon: Clent Demark, MD;  Location: [place] CATH LAB;  Service: Cardiovascular;  Laterality: N/A;  . PORTA CATH INSERTION N/A 01/02/2019   Procedure: PORTA CATH INSERTION;  Surgeon: Algernon Huxley, MD;  Location: [place] CV LAB;  Service: Cardiovascular;  Laterality: N/A;  . PORTACATH PLACEMENT Right 10/28/2018   Procedure: INSERTION PORT-A-CATH;  Surgeon: Jules Husbands, MD;  Location: [facility] ORS;  Service: General;  Laterality: Right;    SOCIAL HISTORY: Social History   Socioeconomic History  . Marital status: Married    Spouse name: [name]   . Number of children: 3  . Years of education: Not on file  . Highest education level: Not on file  Occupational History  . Occupation: Retired    Comment: Chief Technology Officer   Tobacco Use  . Smoking status: Former Smoker    Packs/day: 1.00    Years: 39.00    Pack years: 39.00    Types: Cigarettes    Start date: 12/21/1978    Quit date: 10/16/2018    Years since quitting: 1.7  . Smokeless tobacco: Never Used  Vaping Use  . Vaping Use: Never used  Substance and Sexual Activity  . Alcohol use: Yes    Alcohol/week: 0.0 standard drinks    Comment:  occassional - approx 1 every 2 weeks   . Drug use: No  . Sexual activity: Yes    Birth control/protection: None  Other Topics Concern  . Not on file  Social History Narrative  . Not on file   Social Determinants of Health   Financial Resource Strain:   . Difficulty of Paying Living Expenses:   Food Insecurity:   . Worried About Charity fundraiser in the Last Year:   . Arboriculturist in the Last Year:   Transportation Needs:   . Film/video editor (Medical):   [name] Kitchen Lack of Transportation (Non-Medical):   Physical Activity:   . Days of Exercise per Week:   . Minutes of Exercise per Session:   Stress:   . Feeling of Stress :   Social Connections:   . Frequency of Communication with Friends and Family:   . Frequency of Social Gatherings with Friends and Family:   . Attends Religious Services:   . Active Member of Clubs or Organizations:   . Attends Archivist Meetings:   [name] Kitchen Marital Status:   Intimate Partner Violence:   . Fear of Current or Ex-Partner:   . Emotionally Abused:   [name] Kitchen Physically Abused:   . Sexually Abused:  FAMILY HISTORY: Family History  Problem Relation Age of Onset  . Alzheimer's disease Mother   . Colon cancer Father   . Breast cancer Neg Hx     ALLERGIES:  has No Known Allergies.  MEDICATIONS:  Current Outpatient Medications  Medication Sig Dispense Refill  . acetaminophen (TYLENOL) 500 MG tablet Take 500 mg by mouth 2 (two) times daily as needed for moderate pain or headache.    Huey Bienenstock (TECENTRIQ IV) Inject 1 Dose into the vein every 30 (thirty) days.    . Cholecalciferol (DIALYVITE VITAMIN D 5000) 125 MCG (5000 UT) capsule Take 5,000 Units by mouth daily.    . diphenhydrAMINE-zinc acetate (BENADRYL EXTRA STRENGTH) cream Apply 1 application topically 3 (three) times daily as needed for itching. 28.4 g 0  . HYDROcodone-acetaminophen (NORCO/VICODIN) 5-325 MG tablet Take 1 tablet by mouth every 6 (six) hours as needed for  moderate pain. 90 tablet 0  . lidocaine-prilocaine (EMLA) cream Apply 1 application topically daily as needed (port access). 30 g 2  . megestrol (MEGACE) 400 MG/10ML suspension Take 10 mLs (400 mg total) by mouth 2 (two) times daily. 240 mL 1  . omeprazole (PRILOSEC) 40 MG capsule Take 1 capsule (40 mg total) by mouth daily. 90 capsule 1  . polyethylene glycol (MIRALAX / GLYCOLAX) packet Take 17 g by mouth daily as needed for mild constipation.     . senna (SENOKOT) 8.6 MG TABS tablet Take 2 tablets (17.2 mg total) by mouth daily. 120 tablet 0  . sertraline (ZOLOFT) 50 MG tablet Take 1.5 tablets (75 mg total) by mouth daily. 45 tablet 6  . vitamin B-12 (CYANOCOBALAMIN) 1000 MCG tablet Take 1,000 mcg by mouth daily.    . Zoledronic Acid (ZOMETA IV) Inject 1 Dose into the vein every 30 (thirty) days.    [name] Kitchen loperamide (IMODIUM A-D) 2 MG tablet Take 2 mg by mouth 4 (four) times daily as needed for diarrhea or loose stools. (Patient not taking: Reported on 06/25/2020)    . nitroGLYCERIN (NITROSTAT) 0.4 MG SL tablet Place 1 tablet (0.4 mg total) under the tongue every 5 (five) minutes x 3 doses as needed for chest pain. (Patient not taking: Reported on 06/25/2020) 25 tablet 12   No current facility-administered medications for this visit.   Facility-Administered Medications Ordered in Other Visits  Medication Dose Route Frequency Provider Last Rate Last Admin  . heparin lock flush 100 unit/mL  500 Units Intravenous Once Earlie Server, MD      . heparin lock flush 100 unit/mL  500 Units Intravenous Once Earlie Server, MD      . sodium chloride flush (NS) 0.9 % injection 10 mL  10 mL Intravenous PRN Earlie Server, MD   10 mL at 01/09/19 0820  . sodium chloride flush (NS) 0.9 % injection 10 mL  10 mL Intravenous PRN Earlie Server, MD   10 mL at 01/10/19 1400     PHYSICAL EXAMINATION: ECOG PERFORMANCE STATUS: 1 - Symptomatic but completely ambulatory Vitals:   07/16/20 0922  BP: 106/80  Pulse: 84  Temp: (!) 97.4 F (36.3  C)  SpO2: 99%   Filed Weights   07/16/20 0922  Weight: 134 lb 12.8 oz (61.1 kg)   Physical Exam Constitutional:      General: She is not in acute distress.    Appearance: She is not diaphoretic.     Comments: Patient sits in the wheelchair.  HENT:     Head: Normocephalic and atraumatic.  Nose: Nose normal.     Mouth/Throat:     Pharynx: No oropharyngeal exudate.  Eyes:     General: No scleral icterus.    Pupils: Pupils are equal, round, and reactive to light.  Cardiovascular:     Rate and Rhythm: Normal rate and regular rhythm.     Heart sounds: No murmur heard.   Pulmonary:     Effort: Pulmonary effort is normal. No respiratory distress.     Breath sounds: No rales.  Chest:     Chest wall: No tenderness.  Abdominal:     General: There is no distension.     Palpations: Abdomen is soft.     Tenderness: There is no abdominal tenderness.  Musculoskeletal:        General: Normal range of motion.     Cervical back: Normal range of motion and neck supple.  Skin:    General: Skin is warm and dry.     Findings: No erythema.  Neurological:     Mental Status: She is alert and oriented to person, place, and time.     Cranial Nerves: No cranial nerve deficit.     Motor: No abnormal muscle tone.     Coordination: Coordination normal.  Psychiatric:        Mood and Affect: Affect normal.        LABORATORY DATA:  I have reviewed the data as listed Lab Results  Component Value Date   WBC 5.5 07/16/2020   HGB 11.7 (L) 07/16/2020   HCT 34.5 (L) 07/16/2020   MCV 88.5 07/16/2020   PLT 166 07/16/2020   Recent Labs    06/04/20 0848 06/25/20 0930 07/16/20 0904  NA 137 136 136  K 3.6 4.0 4.0  CL 104 102 104  CO2 24 23 24   GLUCOSE 105* 105* 103*  BUN 16 17 16   CREATININE 0.75 0.86 0.76  CALCIUM 8.8* 9.3 8.9  GFRNONAA >60 >60 >60  GFRAA >60 >60 >60  PROT 7.0 7.4 7.2  ALBUMIN 3.7 4.0 4.0  AST 15 13* 14*  ALT 11 10 9   ALKPHOS 55 51 43  BILITOT 0.5 0.6 0.6      RADIOGRAPHIC STUDIES: I have personally reviewed the radiological images as listed and agreed with the findings in the report.  CT Chest W Contrast  Result Date: 04/29/2020 CLINICAL DATA:  History of small cell lung cancer post chemotherapy and radiation therapy. EXAM: CT CHEST, ABDOMEN, AND PELVIS WITH CONTRAST TECHNIQUE: Multidetector CT imaging of the chest, abdomen and pelvis was performed following the standard protocol during bolus administration of intravenous contrast. CONTRAST:  130mL OMNIPAQUE IOHEXOL 300 MG/ML  SOLN COMPARISON:  Prior CTs 01/19/2020 and 10/23/2019. FINDINGS: CT CHEST FINDINGS Cardiovascular: No acute vascular findings. No evidence of pulmonary embolism. Right IJ Port-A-Cath extends to the superior cavoatrial junction. There is diffuse atherosclerosis of the aorta, great vessels and coronary arteries. Probable calcifications of the aortic valve. The heart size is normal. There is no pericardial effusion. Mediastinum/Nodes: There are no enlarged mediastinal, hilar or axillary lymph nodes. The thyroid gland, trachea and esophagus demonstrate no significant findings. Lungs/Pleura: No pleural effusion or pneumothorax. Stable mild centrilobular emphysema with central airway thickening and calcified right lung granulomas. There is chronic linear scarring at both lung bases. No suspicious pulmonary nodularity. Musculoskeletal/Chest wall: Status post interval spinal augmentation at T7. Cement extends into the T6-7 and T7-8 discs. There is a new mild inferior endplate compression deformity at T4. There is also new mild sclerosis  posteriorly in the C7, T1 and T2 vertebral bodies. No lytic lesions identified. There is a healing fracture of the upper sternal body which is mildly displaced. CT ABDOMEN AND PELVIS FINDINGS Hepatobiliary: The liver is normal in density without suspicious focal abnormality. No significant biliary dilatation post cholecystectomy. Pancreas: Atrophied without ductal  dilatation or surrounding inflammation. Spleen: Normal in size without focal abnormality. Adrenals/Urinary Tract: Both adrenal glands appear normal. The kidneys and ureters appear normal. No evidence of renal mass, urinary tract calculus or hydronephrosis. Stable bladder distension and probable small cystocele. Stomach/Bowel: No evidence of bowel wall thickening, distention or surrounding inflammatory change. Several small bowel loops and a portion of the mid transverse colon protrude into a ventral hernia. No evidence of incarceration or obstruction. Vascular/Lymphatic: There are no enlarged abdominal or pelvic lymph nodes. Aortic and branch vessel atherosclerosis. No acute vascular findings. The portal, superior mesenteric and splenic veins are patent. Reproductive: The uterus and ovaries appear stable. No adnexal mass. Other: Previous ventral hernia repair with stable recurrent hernia inferior to the mesh containing small bowel and transverse colon. No ascites or peritoneal nodularity. Musculoskeletal: No acute or significant osseous findings. IMPRESSION: 1. No evidence of local recurrence of lung cancer or metastatic disease in the chest, abdomen or pelvis. 2. Status post interval spinal augmentation at T7. There is a new mild inferior endplate compression deformity at T4 and new mild sclerosis posteriorly in the C7, T1 and T2 vertebral bodies, possibly reactive or related to previous radiation. Subacute fracture of the upper sternal body. 3. Stable recurrent ventral abdominal hernia inferior to the mesh containing small bowel and transverse colon. No evidence of incarceration or obstruction. 4. Aortic Atherosclerosis (ICD10-I70.0) and Emphysema (ICD10-J43.9). Electronically Signed   By: Richardean Sale M.D.   On: 04/29/2020 12:14   CT Abdomen Pelvis W Contrast  Result Date: 04/29/2020 CLINICAL DATA:  History of small cell lung cancer post chemotherapy and radiation therapy. EXAM: CT CHEST, ABDOMEN, AND  PELVIS WITH CONTRAST TECHNIQUE: Multidetector CT imaging of the chest, abdomen and pelvis was performed following the standard protocol during bolus administration of intravenous contrast. CONTRAST:  141mL OMNIPAQUE IOHEXOL 300 MG/ML  SOLN COMPARISON:  Prior CTs 01/19/2020 and 10/23/2019. FINDINGS: CT CHEST FINDINGS Cardiovascular: No acute vascular findings. No evidence of pulmonary embolism. Right IJ Port-A-Cath extends to the superior cavoatrial junction. There is diffuse atherosclerosis of the aorta, great vessels and coronary arteries. Probable calcifications of the aortic valve. The heart size is normal. There is no pericardial effusion. Mediastinum/Nodes: There are no enlarged mediastinal, hilar or axillary lymph nodes. The thyroid gland, trachea and esophagus demonstrate no significant findings. Lungs/Pleura: No pleural effusion or pneumothorax. Stable mild centrilobular emphysema with central airway thickening and calcified right lung granulomas. There is chronic linear scarring at both lung bases. No suspicious pulmonary nodularity. Musculoskeletal/Chest wall: Status post interval spinal augmentation at T7. Cement extends into the T6-7 and T7-8 discs. There is a new mild inferior endplate compression deformity at T4. There is also new mild sclerosis posteriorly in the C7, T1 and T2 vertebral bodies. No lytic lesions identified. There is a healing fracture of the upper sternal body which is mildly displaced. CT ABDOMEN AND PELVIS FINDINGS Hepatobiliary: The liver is normal in density without suspicious focal abnormality. No significant biliary dilatation post cholecystectomy. Pancreas: Atrophied without ductal dilatation or surrounding inflammation. Spleen: Normal in size without focal abnormality. Adrenals/Urinary Tract: Both adrenal glands appear normal. The kidneys and ureters appear normal. No evidence of renal mass, urinary  tract calculus or hydronephrosis. Stable bladder distension and probable small  cystocele. Stomach/Bowel: No evidence of bowel wall thickening, distention or surrounding inflammatory change. Several small bowel loops and a portion of the mid transverse colon protrude into a ventral hernia. No evidence of incarceration or obstruction. Vascular/Lymphatic: There are no enlarged abdominal or pelvic lymph nodes. Aortic and branch vessel atherosclerosis. No acute vascular findings. The portal, superior mesenteric and splenic veins are patent. Reproductive: The uterus and ovaries appear stable. No adnexal mass. Other: Previous ventral hernia repair with stable recurrent hernia inferior to the mesh containing small bowel and transverse colon. No ascites or peritoneal nodularity. Musculoskeletal: No acute or significant osseous findings. IMPRESSION: 1. No evidence of local recurrence of lung cancer or metastatic disease in the chest, abdomen or pelvis. 2. Status post interval spinal augmentation at T7. There is a new mild inferior endplate compression deformity at T4 and new mild sclerosis posteriorly in the C7, T1 and T2 vertebral bodies, possibly reactive or related to previous radiation. Subacute fracture of the upper sternal body. 3. Stable recurrent ventral abdominal hernia inferior to the mesh containing small bowel and transverse colon. No evidence of incarceration or obstruction. 4. Aortic Atherosclerosis (ICD10-I70.0) and Emphysema (ICD10-J43.9). Electronically Signed   By: Richardean Sale M.D.   On: 04/29/2020 12:14     ASSESSMENT & PLAN:  1. Small cell lung cancer ([place])   2. Memory loss   3. Encounter for antineoplastic immunotherapy   4. Thoracic compression fracture, closed, initial encounter ([place])   5. Osteopenia, unspecified location    #Extensive small cell lung cancer S/p 4 cycles of Carboplatin, Etoposide and Tecentriq.   Status post chest and whole brain radiation.  Currently on Tecentriq maintenance. She has been doing well clinically. Labs are reviewed and discussed  with patient Counts acceptable to proceed with Tecentriq maintenance today.   TSH has been monitored Repeat CT chest abdomen pelvis in August Plan to repeat MRI brain in the near future to   #Back pain secondary to thoracic compression fracture.  Continue Norco every 6 hours as needed.  Patient prefers to stay off fentanyl patch due to drowsiness. #Osteopenia continue calcium and vitamin D supplementation. Calcium has improved.  Patient will proceed with Zometa today. #Constipation, improved, continue bowel regimen. #Memory loss, due to radiation. #Falls, continue physical therapy. #Weight loss/cachexia, I recommend patient to try Megace. She has a wedding to attend around the time when she is due for the next treatment.  We will postpone treatment to the following week Follow up in 4 weeks for eval with next cycle of tecentriq    Earlie Server, MD, PhD

## 2020-07-18 ENCOUNTER — Inpatient Hospital Stay: Payer: Medicare Other

## 2020-07-18 NOTE — Progress Notes (Signed)
Nutrition  Called patient for scheduled nutrition follow-up via phone.  No answer.  Left message on voicemail with callback number.  Betzayda Braxton B. Zenia Resides, [place], [place] Registered Dietitian [phone] (mobile)

## 2020-07-25 ENCOUNTER — Other Ambulatory Visit: Payer: Self-pay

## 2020-07-25 ENCOUNTER — Ambulatory Visit
Admission: RE | Admit: 2020-07-25 | Discharge: 2020-07-25 | Disposition: A | Discharge status: Home / Self Care | Payer: Medicare Other | Source: Ambulatory Visit | Attending: Oncology | Admitting: Oncology

## 2020-07-25 DIAGNOSIS — C3490 Malignant neoplasm of unspecified part of unspecified bronchus or lung: Secondary | ICD-10-CM | POA: Insufficient documentation

## 2020-07-25 DIAGNOSIS — I2510 Atherosclerotic heart disease of native coronary artery without angina pectoris: Secondary | ICD-10-CM | POA: Diagnosis not present

## 2020-07-25 DIAGNOSIS — J432 Centrilobular emphysema: Secondary | ICD-10-CM | POA: Diagnosis not present

## 2020-07-25 DIAGNOSIS — S2220XA Unspecified fracture of sternum, initial encounter for closed fracture: Secondary | ICD-10-CM | POA: Diagnosis not present

## 2020-07-25 DIAGNOSIS — K439 Ventral hernia without obstruction or gangrene: Secondary | ICD-10-CM | POA: Diagnosis not present

## 2020-07-25 DIAGNOSIS — K469 Unspecified abdominal hernia without obstruction or gangrene: Secondary | ICD-10-CM | POA: Diagnosis not present

## 2020-07-25 DIAGNOSIS — I700 Atherosclerosis of aorta: Secondary | ICD-10-CM | POA: Diagnosis not present

## 2020-07-25 DIAGNOSIS — K529 Noninfective gastroenteritis and colitis, unspecified: Secondary | ICD-10-CM | POA: Diagnosis not present

## 2020-07-25 DIAGNOSIS — K6389 Other specified diseases of intestine: Secondary | ICD-10-CM | POA: Diagnosis not present

## 2020-07-25 MED ORDER — IOHEXOL 300 MG/ML  SOLN
100.0000 mL | Freq: Once | INTRAMUSCULAR | Status: AC | PRN
  Administered 2020-07-25: 100 mL via INTRAVENOUS

## 2020-08-09 ENCOUNTER — Other Ambulatory Visit: Payer: Self-pay

## 2020-08-09 DIAGNOSIS — C3490 Malignant neoplasm of unspecified part of unspecified bronchus or lung: Secondary | ICD-10-CM

## 2020-08-12 ENCOUNTER — Inpatient Hospital Stay: Payer: Medicare Other

## 2020-08-12 ENCOUNTER — Encounter: Payer: Self-pay | Admitting: Oncology

## 2020-08-12 ENCOUNTER — Other Ambulatory Visit: Payer: Self-pay

## 2020-08-12 ENCOUNTER — Inpatient Hospital Stay: Payer: Medicare Other | Attending: Oncology

## 2020-08-12 ENCOUNTER — Inpatient Hospital Stay (HOSPITAL_BASED_OUTPATIENT_CLINIC_OR_DEPARTMENT_OTHER): Payer: Medicare Other | Admitting: Oncology

## 2020-08-12 ENCOUNTER — Inpatient Hospital Stay: Payer: Medicare Other | Admitting: Hospice and Palliative Medicine

## 2020-08-12 VITALS — BP 119/85 | HR 77 | Temp 97.8°F | Resp 19 | Wt 136.1 lb

## 2020-08-12 DIAGNOSIS — Z5112 Encounter for antineoplastic immunotherapy: Secondary | ICD-10-CM

## 2020-08-12 DIAGNOSIS — R413 Other amnesia: Secondary | ICD-10-CM | POA: Diagnosis not present

## 2020-08-12 DIAGNOSIS — C3490 Malignant neoplasm of unspecified part of unspecified bronchus or lung: Secondary | ICD-10-CM

## 2020-08-12 DIAGNOSIS — R5383 Other fatigue: Secondary | ICD-10-CM

## 2020-08-12 DIAGNOSIS — Z79899 Other long term (current) drug therapy: Secondary | ICD-10-CM | POA: Insufficient documentation

## 2020-08-12 DIAGNOSIS — K439 Ventral hernia without obstruction or gangrene: Secondary | ICD-10-CM

## 2020-08-12 DIAGNOSIS — M8580 Other specified disorders of bone density and structure, unspecified site: Secondary | ICD-10-CM

## 2020-08-12 DIAGNOSIS — E860 Dehydration: Secondary | ICD-10-CM

## 2020-08-12 DIAGNOSIS — Z87891 Personal history of nicotine dependence: Secondary | ICD-10-CM | POA: Insufficient documentation

## 2020-08-12 LAB — COMPREHENSIVE METABOLIC PANEL
ALT: 12 U/L (ref 0–44)
AST: 17 U/L (ref 15–41)
Albumin: 4 g/dL (ref 3.5–5.0)
Alkaline Phosphatase: 49 U/L (ref 38–126)
Anion gap: 7 (ref 5–15)
BUN: 13 mg/dL (ref 8–23)
CO2: 23 mmol/L (ref 22–32)
Calcium: 8.8 mg/dL — ABNORMAL LOW (ref 8.9–10.3)
Chloride: 104 mmol/L (ref 98–111)
Creatinine, Ser: 0.81 mg/dL (ref 0.44–1.00)
GFR calc Af Amer: >60 mL/min (ref >60)
GFR calc non Af Amer: >60 mL/min (ref >60)
Glucose, Bld: 98 mg/dL (ref 70–99)
Potassium: 4 mmol/L (ref 3.5–5.1)
Sodium: 134 mmol/L — ABNORMAL LOW (ref 135–145)
Total Bilirubin: 0.5 mg/dL (ref 0.3–1.2)
Total Protein: 7.4 g/dL (ref 6.5–8.1)

## 2020-08-12 LAB — CBC WITH DIFFERENTIAL/PLATELET
Abs Immature Granulocytes: 0.07 10*3/uL (ref 0.00–0.07)
Basophils Absolute: 0 10*3/uL (ref 0.0–0.1)
Basophils Relative: 1 %
Eosinophils Absolute: 0.4 10*3/uL (ref 0.0–0.5)
Eosinophils Relative: 6 %
HCT: 34.4 % — ABNORMAL LOW (ref 36.0–46.0)
Hemoglobin: 11.8 g/dL — ABNORMAL LOW (ref 12.0–15.0)
Immature Granulocytes: 1 %
Lymphocytes Relative: 23 %
Lymphs Abs: 1.5 10*3/uL (ref 0.7–4.0)
MCH: 30 pg (ref 26.0–34.0)
MCHC: 34.3 g/dL (ref 30.0–36.0)
MCV: 87.5 fL (ref 80.0–100.0)
Monocytes Absolute: 0.9 10*3/uL (ref 0.1–1.0)
Monocytes Relative: 14 %
Neutro Abs: 3.6 10*3/uL (ref 1.7–7.7)
Neutrophils Relative %: 55 %
Platelets: 186 10*3/uL (ref 150–400)
RBC: 3.93 MIL/uL (ref 3.87–5.11)
RDW: 13.5 % (ref 11.5–15.5)
WBC: 6.4 10*3/uL (ref 4.0–10.5)
nRBC: 0 % (ref 0.0–0.2)

## 2020-08-12 LAB — T4, FREE: Free T4: 1.15 ng/dL — ABNORMAL HIGH (ref 0.61–1.12)

## 2020-08-12 LAB — TSH: TSH: 0.83 u[IU]/mL (ref 0.350–4.500)

## 2020-08-12 MED ORDER — SODIUM CHLORIDE 0.9 % IV SOLN
Freq: Once | INTRAVENOUS | Status: AC
  Filled 2020-08-12: qty 250

## 2020-08-12 MED ORDER — SODIUM CHLORIDE 0.9 % IV SOLN
1200.0000 mg | Freq: Once | INTRAVENOUS | Status: AC
  Administered 2020-08-12: 1200 mg via INTRAVENOUS
  Filled 2020-08-12: qty 20

## 2020-08-12 MED ORDER — HEPARIN SOD (PORK) LOCK FLUSH 100 UNIT/ML IV SOLN
INTRAVENOUS | Status: AC
  Filled 2020-08-12: qty 5

## 2020-08-12 MED ORDER — ZOLEDRONIC ACID 4 MG/100ML IV SOLN
4.0000 mg | Freq: Once | INTRAVENOUS | Status: AC
  Administered 2020-08-12: 4 mg via INTRAVENOUS
  Filled 2020-08-12: qty 100

## 2020-08-12 MED ORDER — DEXAMETHASONE SODIUM PHOSPHATE 10 MG/ML IJ SOLN
10.0000 mg | Freq: Once | INTRAMUSCULAR | Status: AC
  Administered 2020-08-12: 10 mg via INTRAVENOUS
  Filled 2020-08-12: qty 1

## 2020-08-12 MED ORDER — HEPARIN SOD (PORK) LOCK FLUSH 100 UNIT/ML IV SOLN
500.0000 [IU] | Freq: Once | INTRAVENOUS | Status: AC | PRN
  Administered 2020-08-12: 500 [IU]
  Filled 2020-08-12: qty 5

## 2020-08-12 MED ORDER — SODIUM CHLORIDE 0.9% FLUSH
10.0000 mL | Freq: Once | INTRAVENOUS | Status: AC
  Administered 2020-08-12: 10 mL via INTRAVENOUS
  Filled 2020-08-12: qty 10

## 2020-08-12 NOTE — Progress Notes (Signed)
Nutrition Follow-up:  Patient with small cell lung cancer followed by Dr Tasia Catchings.  Currently on tecentriq.    Met with patient during infusion this afternoon for nutrition follow-up.  Patient reports that appetite is better and taste are coming back.  Eating baked chips during visit.  Reports that she tries to weigh herself every few days.  "I don't want to gain all my weight back."  Drinks premier protein but not everyday.  "I have been eating ice cream and snacking which has increased my weight."    Medications: reviewed  Labs: reviewed  Anthropometrics:   Weight 136 lb 1.6 oz today increased from 134 lb on 7/27  137 lb on 6/15  152 lb on 04/24/2019  NUTRITION DIAGNOSIS: Inadequate oral intake continues   INTERVENTION:  Encouraged patient to drink premier protein daily for additional calories and protein Stressed importance of not loosing more weight. Encouraged high calorie, high protein foods for weight maintenance.   Patient declined RD follow-up and prefers to reach out to RD if needed in the future.  Patient has contact information   NEXT VISIT: no follow-up  Alnisa Hasley B. Zenia Resides, [place], [place] Registered Dietitian [phone] (mobile)

## 2020-08-12 NOTE — Progress Notes (Signed)
Hematology/Oncology Follow up note [facility] [phone] [phone]   Patient Care Team: Glean Hess, MD as PCP - General (Internal Medicine) Charolette Forward, MD as Consulting Physician (Cardiology) Telford Nab, RN as Registered Nurse [name], MD as Radiation Oncologist (Radiation Oncology)  REFERRING PROVIDER: Dr. Felicie Morn REASON FOR VISIT:  Follow-up for small cell lung cancer,   HISTORY OF PRESENTING ILLNESS:  Kristi [name] is a  74 y.o.  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  10/20/2018 CT chest with contrast showed large mediastinal mass involving both hilar, left greater than right, consistent with lung carcinoma, The mass causes narrowing of the left mainstem bronchus with resultant volume loss on the left and a mediastinal shift to the left.  Moderate size left pleural effusion. 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  # Initial MRI brain negative.  # Nov 2019- Jan 2020 s/p 4 cycles of Carbo/Etoposide/Tecentriq #  01/24/2019 interim CT scan done which showed continued positive response to therapy with continued reduction in mediastinal adenopathy.  No residual measurable left lung mass. CT findings of acute emphysematous cystitis. Urology did not feel that patient has pyelonephritis and recommend treatment with antibiotics because patient's immunocompromise. Patient finished treatment.   # 11/02/2018-01/09/2019 Chemotherapy carboplatin + Etoposide + tecentriq # Consolidation chest radiation and whole brain radiation finished in May 2020 # 04/25/2019 resume Tecentriq every 3 weeks. # Patient had a fall from her stairs on 01/19/2020. In the emergency room she had CT chest abdomen pelvis CT, CT head without contrast, CT cervical spine without contrast. No CT evidence for acute  intracranial abnormality, degenerative changes of cervical spine..  No CT evidence of acute thoracic abdomen injury.  Severe compression fracture of T7, subacute.  # kyphoplasty procedure on 03/01/2020.  T7 biopsy negative for cancer.  INTERVAL HISTORY Kristi [name] is a 74 y.o. female who has above history reviewed by me today presents for evaluation prior to immunotherapy for treatment of extensive small cell lung cancer. Patient is on immunotherapy maintenance. Patient was accompanied by her [name] today. Patient feels that her back pain is on and off, she takes Norco if needed. Chronic fatigue is at baseline. Constipation is better. She has not tried the Megace yet. She has gained 2 pounds since last visit .[name] Kitchen  Review of Systems  Constitutional: Positive for fatigue. Negative for appetite change, chills and fever.  HENT:   Negative for hearing loss and voice change.   Eyes: Negative for eye problems.  Respiratory: Negative for chest tightness, cough and shortness of breath.   Cardiovascular: Negative for chest pain.  Gastrointestinal: Negative for abdominal distention, abdominal pain, blood in stool, diarrhea and nausea.  Endocrine: Negative for hot flashes.  Genitourinary: Negative for difficulty urinating and frequency.   Musculoskeletal: Positive for back pain. Negative for arthralgias.  Skin: Negative for itching and rash.  Neurological: Negative for extremity weakness.  Hematological: Negative for adenopathy.  Psychiatric/Behavioral: Negative for confusion and depression. The patient is not nervous/anxious.        Forgetful     MEDICAL HISTORY:  Past Medical History:  Diagnosis Date  . Acute MI, inferoposterior wall ([place]) 09/30/2014  . Claustrophobia   . Coronary artery disease   . GERD (gastroesophageal reflux disease)   . Hypercholesteremia   . Hypertension   . MI, old   . Pneumonia   .  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 [facility]) 10/27/2018    SURGICAL  HISTORY: Past Surgical History:  Procedure Laterality Date  . APPENDECTOMY     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  . BLADDER NECK SUSPENSION    . CHOLECYSTECTOMY N/A 08/14/2018   Procedure: LAPAROSCOPIC CHOLECYSTECTOMY;  Surgeon: Jules Husbands, MD;  Location: [facility] ORS;  Service: General;  Laterality: N/A;  . CHOLECYSTECTOMY  11/2018  . CORONARY ANGIOPLASTY  09/29/2014  . CORONARY STENT PLACEMENT    . ENDOBRONCHIAL ULTRASOUND N/A 10/21/2018   Procedure: ENDOBRONCHIAL ULTRASOUND;  Surgeon: Laverle Hobby, MD;  Location: [facility] ORS;  Service: Pulmonary;  Laterality: N/A;  . HERNIA REPAIR    . IR FLUORO GUIDE CV LINE RIGHT  12/19/2018  . KYPHOPLASTY N/A 03/01/2020   Procedure: T7 KYPHOPLASTY;  Surgeon: Hessie Knows, MD;  Location: [facility] ORS;  Service: Orthopedics;  Laterality: N/A;  . LEFT HEART CATHETERIZATION WITH CORONARY ANGIOGRAM N/A 09/29/2014   Procedure: LEFT HEART CATHETERIZATION WITH CORONARY ANGIOGRAM;  Surgeon: Clent Demark, MD;  Location: [place] CATH LAB;  Service: Cardiovascular;  Laterality: N/A;  . PORTA CATH INSERTION N/A 01/02/2019   Procedure: PORTA CATH INSERTION;  Surgeon: Algernon Huxley, MD;  Location: [place] CV LAB;  Service: Cardiovascular;  Laterality: N/A;  . PORTACATH PLACEMENT Right 10/28/2018   Procedure: INSERTION PORT-A-CATH;  Surgeon: Jules Husbands, MD;  Location: [facility] ORS;  Service: General;  Laterality: Right;    SOCIAL HISTORY: Social History   Socioeconomic History  . Marital status: Married    Spouse name: [name]   . Number of children: 3  . Years of education: Not on file  . Highest education level: Not on file  Occupational History  . Occupation: Retired    Comment: Chief Technology Officer   Tobacco Use  . Smoking status: Former Smoker    Packs/day: 1.00    Years: 39.00    Pack years: 39.00    Types: Cigarettes    Start date: 12/21/1978    Quit date: 10/16/2018    Years since quitting: 1.8  . Smokeless tobacco: Never Used  Vaping Use  . Vaping Use:  Never used  Substance and Sexual Activity  . Alcohol use: Yes    Alcohol/week: 0.0 standard drinks    Comment: occassional - approx 1 every 2 weeks   . Drug use: No  . Sexual activity: Yes    Birth control/protection: None  Other Topics Concern  . Not on file  Social History Narrative  . Not on file   Social Determinants of Health   Financial Resource Strain:   . Difficulty of Paying Living Expenses: Not on file  Food Insecurity:   . Worried About Charity fundraiser in the Last Year: Not on file  . Ran Out of Food in the Last Year: Not on file  Transportation Needs:   . Lack of Transportation (Medical): Not on file  . Lack of Transportation (Non-Medical): Not on file  Physical Activity:   . Days of Exercise per Week: Not on file  . Minutes of Exercise per Session: Not on file  Stress:   . Feeling of Stress : Not on file  Social Connections:   . Frequency of Communication with Friends and Family: Not on file  . Frequency of Social Gatherings with Friends and Family: Not on file  . Attends Religious Services: Not on file  . Active Member of Clubs or Organizations: Not on file  . Attends Archivist Meetings: Not  on file  . Marital Status: Not on file  Intimate Partner Violence:   . Fear of Current or Ex-Partner: Not on file  . Emotionally Abused: Not on file  . Physically Abused: Not on file  . Sexually Abused: Not on file    FAMILY HISTORY: Family History  Problem Relation Age of Onset  . Alzheimer's disease Mother   . Colon cancer Father   . Breast cancer Neg Hx     ALLERGIES:  has No Known Allergies.  MEDICATIONS:  Current Outpatient Medications  Medication Sig Dispense Refill  . acetaminophen (TYLENOL) 500 MG tablet Take 500 mg by mouth 2 (two) times daily as needed for moderate pain or headache.    Huey Bienenstock (TECENTRIQ IV) Inject 1 Dose into the vein every 30 (thirty) days.    . Cholecalciferol (DIALYVITE VITAMIN D 5000) 125 MCG (5000 UT)  capsule Take 5,000 Units by mouth daily.    . diphenhydrAMINE-zinc acetate (BENADRYL EXTRA STRENGTH) cream Apply 1 application topically 3 (three) times daily as needed for itching. 28.4 g 0  . HYDROcodone-acetaminophen (NORCO/VICODIN) 5-325 MG tablet Take 1 tablet by mouth every 6 (six) hours as needed for moderate pain. 90 tablet 0  . lidocaine-prilocaine (EMLA) cream Apply 1 application topically daily as needed (port access). 30 g 2  . megestrol (MEGACE) 400 MG/10ML suspension Take 10 mLs (400 mg total) by mouth 2 (two) times daily. 240 mL 1  . omeprazole (PRILOSEC) 40 MG capsule Take 1 capsule (40 mg total) by mouth daily. 90 capsule 1  . polyethylene glycol (MIRALAX / GLYCOLAX) packet Take 17 g by mouth daily as needed for mild constipation.     . senna (SENOKOT) 8.6 MG TABS tablet Take 2 tablets (17.2 mg total) by mouth daily. 120 tablet 0  . sertraline (ZOLOFT) 50 MG tablet Take 1.5 tablets (75 mg total) by mouth daily. 45 tablet 6  . vitamin B-12 (CYANOCOBALAMIN) 1000 MCG tablet Take 1,000 mcg by mouth daily.    . Zoledronic Acid (ZOMETA IV) Inject 1 Dose into the vein every 30 (thirty) days.    [name] Kitchen loperamide (IMODIUM A-D) 2 MG tablet Take 2 mg by mouth 4 (four) times daily as needed for diarrhea or loose stools. (Patient not taking: Reported on 06/25/2020)    . nitroGLYCERIN (NITROSTAT) 0.4 MG SL tablet Place 1 tablet (0.4 mg total) under the tongue every 5 (five) minutes x 3 doses as needed for chest pain. (Patient not taking: Reported on 06/25/2020) 25 tablet 12   No current facility-administered medications for this visit.   Facility-Administered Medications Ordered in Other Visits  Medication Dose Route Frequency Provider Last Rate Last Admin  . heparin lock flush 100 unit/mL  500 Units Intravenous Once Earlie Server, MD      . heparin lock flush 100 unit/mL  500 Units Intravenous Once Earlie Server, MD      . sodium chloride flush (NS) 0.9 % injection 10 mL  10 mL Intravenous PRN Earlie Server, MD    10 mL at 01/09/19 0820  . sodium chloride flush (NS) 0.9 % injection 10 mL  10 mL Intravenous PRN Earlie Server, MD   10 mL at 01/10/19 1400     PHYSICAL EXAMINATION: ECOG PERFORMANCE STATUS: 1 - Symptomatic but completely ambulatory Vitals:   08/12/20 1328  BP: 119/85  Pulse: 77  Resp: 19  Temp: 97.8 F (36.6 C)  SpO2: 90%   Filed Weights   08/12/20 1328  Weight: 136 lb 1.6 oz (  61.7 kg)   Physical Exam Constitutional:      General: She is not in acute distress.    Appearance: She is not diaphoretic.     Comments: Patient sits in the wheelchair.  HENT:     Head: Normocephalic and atraumatic.     Nose: Nose normal.     Mouth/Throat:     Pharynx: No oropharyngeal exudate.  Eyes:     General: No scleral icterus.    Pupils: Pupils are equal, round, and reactive to light.  Cardiovascular:     Rate and Rhythm: Normal rate and regular rhythm.     Heart sounds: No murmur heard.   Pulmonary:     Effort: Pulmonary effort is normal. No respiratory distress.     Breath sounds: No rales.  Chest:     Chest wall: No tenderness.  Abdominal:     General: There is no distension.     Palpations: Abdomen is soft.     Tenderness: There is no abdominal tenderness.  Musculoskeletal:        General: Normal range of motion.     Cervical back: Normal range of motion and neck supple.  Skin:    General: Skin is warm and dry.     Findings: No erythema.  Neurological:     Mental Status: She is alert and oriented to person, place, and time.     Cranial Nerves: No cranial nerve deficit.     Motor: No abnormal muscle tone.     Coordination: Coordination normal.  Psychiatric:        Mood and Affect: Affect normal.        LABORATORY DATA:  I have reviewed the data as listed Lab Results  Component Value Date   WBC 6.4 08/12/2020   HGB 11.8 (L) 08/12/2020   HCT 34.4 (L) 08/12/2020   MCV 87.5 08/12/2020   PLT 186 08/12/2020   Recent Labs    06/25/20 0930 07/16/20 0904  08/12/20 1315  NA 136 136 134*  K 4.0 4.0 4.0  CL 102 104 104  CO2 23 24 23   GLUCOSE 105* 103* 98  BUN 17 16 13   CREATININE 0.86 0.76 0.81  CALCIUM 9.3 8.9 8.8*  GFRNONAA >60 >60 >60  GFRAA >60 >60 >60  PROT 7.4 7.2 7.4  ALBUMIN 4.0 4.0 4.0  AST 13* 14* 17  ALT 10 9 12   ALKPHOS 51 43 49  BILITOT 0.6 0.6 0.5    RADIOGRAPHIC STUDIES: I have personally reviewed the radiological images as listed and agreed with the findings in the report.  CT CHEST ABDOMEN PELVIS W CONTRAST  Result Date: 07/26/2020 CLINICAL DATA:  Follow-up small cell lung cancer EXAM: CT CHEST, ABDOMEN, AND PELVIS WITH CONTRAST TECHNIQUE: Multidetector CT imaging of the chest, abdomen and pelvis was performed following the standard protocol during bolus administration of intravenous contrast. CONTRAST:  171mL OMNIPAQUE IOHEXOL 300 MG/ML  SOLN COMPARISON:  04/29/2020 FINDINGS: CT CHEST FINDINGS Cardiovascular: The heart is normal in size. No pericardial effusion. No evidence of thoracic aortic aneurysm. Atherosclerotic calcifications of the aortic arch. Three vessel coronary atherosclerosis. Right chest port terminates at the cavoatrial junction. Mediastinum/Nodes: No discrete mediastinal lymphadenopathy. Very mild soft tissue thickening in the subcarinal region (series 2/image 27) and along the left lateral aspect of the mid esophagus (series 2/image 23), although similar to priors, and therefore not considered suspicious for true nodal tissue on the current study. No suspicious hilar or axillary lymphadenopathy. Visualized thyroid is unremarkable. Lungs/Pleura:  Moderate centrilobular and paraseptal emphysematous changes, upper lung predominant. Mild subpleural patchy opacities in the posterior left lower lobe (series 4/image 49), unchanged, favoring atelectasis/scarring. New mildly irregular subpleural opacity in the lateral right lower lobe, including a 12 mm irregular nodular component anteriorly (series 4/image 88),  nonspecific/indeterminate but warranting attention on follow-up. Otherwise, no suspicious pulmonary nodules. Stable calcified granulomata in the right upper lobe and right middle lobe, benign. No pleural effusion or pneumothorax. Musculoskeletal: Interval vertebral augmentation at T7. Mild to moderate inferior endplate compression fracture deformity at T4 (sagittal image 108), unchanged. Mild inferior endplate changes at T8, unchanged. Mild degenerative changes of the thoracic spine. Old upper sternal fracture (sagittal image 100). CT ABDOMEN PELVIS FINDINGS Hepatobiliary: Liver is within normal limits. No suspicious/enhancing hepatic lesions. Status post cholecystectomy. No intrahepatic or extrahepatic ductal dilatation. Pancreas: Within normal limits, noting mild parenchymal atrophy. Spleen: Calcified granuloma anteriorly.  Otherwise unremarkable. Adrenals/Urinary Tract: Adrenal glands are within normal limits. Kidneys are within normal limits.  No hydronephrosis. Low-lying bladder with mild anterior wall thickening (series 2/image 101). Stomach/Bowel: Stomach is within normal limits. No evidence of bowel obstruction. Appendix is not discretely visualized. Two right paramidline ventral hernias which both contain loops of nondilated small bowel on the current study (series 7/images 27 and 32). Mild wall thickening involving the ascending colon (series 2/image 69), but without convincing inflammatory changes. Left colon is unremarkable. Vascular/Lymphatic: No evidence of abdominal aortic aneurysm. Atherosclerotic calcifications of the abdominal aorta and branch vessels. No suspicious abdominopelvic lymphadenopathy. Reproductive: Uterus is within normal limits. Bilateral ovaries are unremarkable. Other: No abdominopelvic ascites. Musculoskeletal: Visualized osseous structures are within normal limits. IMPRESSION: Mildly irregular subpleural opacity in the lateral right lower lobe with a 12 mm nodular component. This  appearance is nonspecific and certainly not typical for neoplasm, but warrants attention on follow-up. No findings specific for recurrent or metastatic disease. No suspicious lymphadenopathy in the chest, abdomen, or pelvis. Mild colonic wall thickening involving the ascending colon, but without convincing inflammatory changes, nonspecific. Correlate for infectious/inflammatory colitis. Two right paramidline ventral hernias, both of which now contain loops of nondilated small bowel, without evidence of obstruction. Additional stable ancillary findings as above. Electronically Signed   By: Julian Hy M.D.   On: 07/26/2020 07:57     ASSESSMENT & PLAN:  1. Small cell lung cancer ([place])   2. Ventral hernia without obstruction or gangrene   3. Encounter for antineoplastic immunotherapy   4. Osteopenia, unspecified location   5. Memory loss    #Extensive small cell lung cancer S/p 4 cycles of Carboplatin, Etoposide and Tecentriq.   Status post chest and whole brain radiation.  Currently on Tecentriq maintenance. Patient has been clinically doing very well.  Labs are reviewed and discussed with patient.  Proceed with Tecentriq.  Will obtain MRI brain CT chest abdomen pelvis was independently reviewed by me and discussed with patient.  No definitive evidence of disease recurrence or metastatic disease. There is mildly irregular subpleural opacity in the lateral right lower lobe with a 12 mm nodular component.  Nonspecific but warrants attention on follow-up. Mild chronic wall thickening involving ascending colon.  No convincing inflammatory changes. Patient does not have any diarrhea, abdominal pain clinically.  #Ventral hernia, discussed with patient recommend her to discuss with primary care provider to be referred to surgeon for further discussion of need of hernia repair  #Osteopenia continue calcium and vitamin D supplementation. Calcium has improved.  Patient will proceed with Zometa  treatment today.  #  Constipation, improved, continue bowel regimen. #Memory loss, due to radiation. #Weight loss/cachexia, weight loss has improved.  Follow up in 3 weeks for eval with next cycle of tecentriq    Earlie Server, MD, PhD

## 2020-08-14 ENCOUNTER — Other Ambulatory Visit: Payer: Self-pay

## 2020-08-14 ENCOUNTER — Ambulatory Visit
Admission: RE | Admit: 2020-08-14 | Discharge: 2020-08-14 | Disposition: A | Discharge status: Home / Self Care | Payer: Medicare Other | Source: Ambulatory Visit | Attending: Oncology | Admitting: Oncology

## 2020-08-14 DIAGNOSIS — C3490 Malignant neoplasm of unspecified part of unspecified bronchus or lung: Secondary | ICD-10-CM | POA: Insufficient documentation

## 2020-08-14 DIAGNOSIS — G9389 Other specified disorders of brain: Secondary | ICD-10-CM | POA: Diagnosis not present

## 2020-08-14 MED ORDER — GADOBUTROL 1 MMOL/ML IV SOLN
6.0000 mL | Freq: Once | INTRAVENOUS | Status: AC | PRN
  Administered 2020-08-14: 6 mL via INTRAVENOUS

## 2020-08-27 ENCOUNTER — Ambulatory Visit: Payer: 59 | Admitting: Oncology

## 2020-08-27 ENCOUNTER — Ambulatory Visit: Payer: 59

## 2020-08-27 ENCOUNTER — Other Ambulatory Visit: Payer: 59

## 2020-08-29 ENCOUNTER — Other Ambulatory Visit: Payer: Self-pay

## 2020-08-29 MED ORDER — SENNA 8.6 MG PO TABS
2.0000 | ORAL_TABLET | Freq: Every day | ORAL | 0 refills | Status: DC
Start: 2020-08-29 — End: 2021-02-17

## 2020-08-30 ENCOUNTER — Telehealth: Payer: Self-pay

## 2020-08-30 ENCOUNTER — Other Ambulatory Visit: Payer: Self-pay | Admitting: *Deleted

## 2020-08-30 ENCOUNTER — Other Ambulatory Visit: Payer: Self-pay

## 2020-08-30 DIAGNOSIS — C3490 Malignant neoplasm of unspecified part of unspecified bronchus or lung: Secondary | ICD-10-CM

## 2020-08-30 NOTE — Telephone Encounter (Signed)
The refill was sent yesterday and receipt confirmed by pharmacy.  Has she checked with her pharmacy?

## 2020-08-30 NOTE — Telephone Encounter (Signed)
Called patient to ask if she had checked with her pharmacy today. LM to advise her that prescription had been filled yesterday.

## 2020-08-30 NOTE — Telephone Encounter (Signed)
Patient request refill of Senokot 8.6mg  order pended per MD approval.

## 2020-08-30 NOTE — Telephone Encounter (Signed)
error 

## 2020-09-02 ENCOUNTER — Encounter: Payer: Self-pay | Admitting: Oncology

## 2020-09-02 ENCOUNTER — Other Ambulatory Visit: Payer: Self-pay

## 2020-09-02 ENCOUNTER — Inpatient Hospital Stay: Payer: Medicare Other | Attending: Oncology

## 2020-09-02 ENCOUNTER — Inpatient Hospital Stay: Payer: Medicare Other

## 2020-09-02 ENCOUNTER — Other Ambulatory Visit: Payer: 59

## 2020-09-02 ENCOUNTER — Ambulatory Visit: Payer: 59

## 2020-09-02 ENCOUNTER — Inpatient Hospital Stay (HOSPITAL_BASED_OUTPATIENT_CLINIC_OR_DEPARTMENT_OTHER): Payer: Medicare Other | Admitting: Oncology

## 2020-09-02 ENCOUNTER — Ambulatory Visit: Payer: 59 | Admitting: Oncology

## 2020-09-02 VITALS — BP 95/70 | HR 84 | Temp 98.6°F | Resp 18 | Wt 137.0 lb

## 2020-09-02 DIAGNOSIS — C3490 Malignant neoplasm of unspecified part of unspecified bronchus or lung: Secondary | ICD-10-CM | POA: Insufficient documentation

## 2020-09-02 DIAGNOSIS — Z87891 Personal history of nicotine dependence: Secondary | ICD-10-CM | POA: Insufficient documentation

## 2020-09-02 DIAGNOSIS — Z5112 Encounter for antineoplastic immunotherapy: Secondary | ICD-10-CM

## 2020-09-02 DIAGNOSIS — K439 Ventral hernia without obstruction or gangrene: Secondary | ICD-10-CM | POA: Diagnosis not present

## 2020-09-02 DIAGNOSIS — M8580 Other specified disorders of bone density and structure, unspecified site: Secondary | ICD-10-CM

## 2020-09-02 LAB — COMPREHENSIVE METABOLIC PANEL
ALT: 11 U/L (ref 0–44)
AST: 14 U/L — ABNORMAL LOW (ref 15–41)
Albumin: 3.7 g/dL (ref 3.5–5.0)
Alkaline Phosphatase: 49 U/L (ref 38–126)
Anion gap: 8 (ref 5–15)
BUN: 18 mg/dL (ref 8–23)
CO2: 24 mmol/L (ref 22–32)
Calcium: 8.3 mg/dL — ABNORMAL LOW (ref 8.9–10.3)
Chloride: 105 mmol/L (ref 98–111)
Creatinine, Ser: 0.68 mg/dL (ref 0.44–1.00)
GFR calc Af Amer: >60 mL/min (ref >60)
GFR calc non Af Amer: >60 mL/min (ref >60)
Glucose, Bld: 103 mg/dL — ABNORMAL HIGH (ref 70–99)
Potassium: 3.5 mmol/L (ref 3.5–5.1)
Sodium: 137 mmol/L (ref 135–145)
Total Bilirubin: 0.5 mg/dL (ref 0.3–1.2)
Total Protein: 6.8 g/dL (ref 6.5–8.1)

## 2020-09-02 LAB — CBC WITH DIFFERENTIAL/PLATELET
Abs Immature Granulocytes: 0.04 10*3/uL (ref 0.00–0.07)
Basophils Absolute: 0 10*3/uL (ref 0.0–0.1)
Basophils Relative: 1 %
Eosinophils Absolute: 0.2 10*3/uL (ref 0.0–0.5)
Eosinophils Relative: 3 %
HCT: 32.8 % — ABNORMAL LOW (ref 36.0–46.0)
Hemoglobin: 11.3 g/dL — ABNORMAL LOW (ref 12.0–15.0)
Immature Granulocytes: 1 %
Lymphocytes Relative: 21 %
Lymphs Abs: 1.4 10*3/uL (ref 0.7–4.0)
MCH: 30.1 pg (ref 26.0–34.0)
MCHC: 34.5 g/dL (ref 30.0–36.0)
MCV: 87.2 fL (ref 80.0–100.0)
Monocytes Absolute: 0.8 10*3/uL (ref 0.1–1.0)
Monocytes Relative: 13 %
Neutro Abs: 4.1 10*3/uL (ref 1.7–7.7)
Neutrophils Relative %: 61 %
Platelets: 179 10*3/uL (ref 150–400)
RBC: 3.76 MIL/uL — ABNORMAL LOW (ref 3.87–5.11)
RDW: 13.5 % (ref 11.5–15.5)
WBC: 6.6 10*3/uL (ref 4.0–10.5)
nRBC: 0 % (ref 0.0–0.2)

## 2020-09-02 MED ORDER — DEXAMETHASONE SODIUM PHOSPHATE 10 MG/ML IJ SOLN
10.0000 mg | Freq: Once | INTRAMUSCULAR | Status: AC
  Administered 2020-09-02: 10 mg via INTRAVENOUS
  Filled 2020-09-02: qty 1

## 2020-09-02 MED ORDER — SODIUM CHLORIDE 0.9 % IV SOLN
Freq: Once | INTRAVENOUS | Status: AC
  Filled 2020-09-02: qty 250

## 2020-09-02 MED ORDER — HEPARIN SOD (PORK) LOCK FLUSH 100 UNIT/ML IV SOLN
500.0000 [IU] | Freq: Once | INTRAVENOUS | Status: AC | PRN
  Administered 2020-09-02: 500 [IU]
  Filled 2020-09-02: qty 5

## 2020-09-02 MED ORDER — HEPARIN SOD (PORK) LOCK FLUSH 100 UNIT/ML IV SOLN
INTRAVENOUS | Status: AC
  Filled 2020-09-02: qty 5

## 2020-09-02 MED ORDER — SODIUM CHLORIDE 0.9 % IV SOLN
1200.0000 mg | Freq: Once | INTRAVENOUS | Status: AC
  Administered 2020-09-02: 1200 mg via INTRAVENOUS
  Filled 2020-09-02: qty 20

## 2020-09-02 NOTE — Progress Notes (Signed)
Pt here for Immunotherapy. No side effects other then fatigue. 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

## 2020-09-02 NOTE — Progress Notes (Signed)
Hematology/Oncology Follow up note [facility] [phone] [phone]   Patient Care Team: Glean Hess, MD as PCP - General (Internal Medicine) Charolette Forward, MD as Consulting Physician (Cardiology) Telford Nab, RN as Registered Nurse [name], MD as Radiation Oncologist (Radiation Oncology)  REFERRING PROVIDER: Dr. Felicie Morn REASON FOR VISIT:  Follow-up for small cell lung cancer,   HISTORY OF PRESENTING ILLNESS:  Kristi [name] is a  74 y.o.  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  10/20/2018 CT chest with contrast showed large mediastinal mass involving both hilar, left greater than right, consistent with lung carcinoma, The mass causes narrowing of the left mainstem bronchus with resultant volume loss on the left and a mediastinal shift to the left.  Moderate size left pleural effusion. 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  # Initial MRI brain negative.  # Nov 2019- Jan 2020 s/p 4 cycles of Carbo/Etoposide/Tecentriq #  01/24/2019 interim CT scan done which showed continued positive response to therapy with continued reduction in mediastinal adenopathy.  No residual measurable left lung mass. CT findings of acute emphysematous cystitis. Urology did not feel that patient has pyelonephritis and recommend treatment with antibiotics because patient's immunocompromise. Patient finished treatment.   # 11/02/2018-01/09/2019 Chemotherapy carboplatin + Etoposide + tecentriq # Consolidation chest radiation and whole brain radiation finished in May 2020 # 04/25/2019 resume Tecentriq every 3 weeks. # Patient had a fall from her stairs on 01/19/2020. In the emergency room she had CT chest abdomen pelvis CT, CT head without contrast, CT cervical spine without contrast. No CT evidence for acute  intracranial abnormality, degenerative changes of cervical spine..  No CT evidence of acute thoracic abdomen injury.  Severe compression fracture of T7, subacute.  # kyphoplasty procedure on 03/01/2020.  T7 biopsy negative for cancer.  INTERVAL HISTORY Kristi [name] is a 75 y.o. female who has above history reviewed by me today presents for evaluation prior to immunotherapy for treatment of extensive small cell lung cancer. Patient is on immunotherapy maintenance. Patient reports feeling well. He was accompanied by her husband today. Chronic back pain, iron off, she takes Norco if needed .  Chronic fatigue is at baseline .  No new complaints Her weight has been stable.  ..  Review of Systems  Constitutional: Positive for fatigue. Negative for appetite change, chills and fever.  HENT:   Negative for hearing loss and voice change.   Eyes: Negative for eye problems.  Respiratory: Negative for chest tightness, cough and shortness of breath.   Cardiovascular: Negative for chest pain.  Gastrointestinal: Negative for abdominal distention, abdominal pain, blood in stool, diarrhea and nausea.  Endocrine: Negative for hot flashes.  Genitourinary: Negative for difficulty urinating and frequency.   Musculoskeletal: Positive for back pain. Negative for arthralgias.  Skin: Negative for itching and rash.  Neurological: Negative for extremity weakness.  Hematological: Negative for adenopathy.  Psychiatric/Behavioral: Negative for confusion and depression. The patient is not nervous/anxious.        Forgetful     MEDICAL HISTORY:  Past Medical History:  Diagnosis Date  . Acute MI, inferoposterior wall ([place]) 09/30/2014  . Claustrophobia   . Coronary artery disease   . GERD (gastroesophageal reflux disease)   . Hypercholesteremia   . Hypertension   . MI, old   . Pneumonia   . 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 (  [place]) 10/27/2018    SURGICAL HISTORY: Past Surgical History:  Procedure  Laterality Date  . APPENDECTOMY     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  . BLADDER NECK SUSPENSION    . CHOLECYSTECTOMY N/A 08/14/2018   Procedure: LAPAROSCOPIC CHOLECYSTECTOMY;  Surgeon: Jules Husbands, MD;  Location: [facility] ORS;  Service: General;  Laterality: N/A;  . CHOLECYSTECTOMY  11/2018  . CORONARY ANGIOPLASTY  09/29/2014  . CORONARY STENT PLACEMENT    . ENDOBRONCHIAL ULTRASOUND N/A 10/21/2018   Procedure: ENDOBRONCHIAL ULTRASOUND;  Surgeon: Laverle Hobby, MD;  Location: [facility] ORS;  Service: Pulmonary;  Laterality: N/A;  . HERNIA REPAIR    . IR FLUORO GUIDE CV LINE RIGHT  12/19/2018  . KYPHOPLASTY N/A 03/01/2020   Procedure: T7 KYPHOPLASTY;  Surgeon: Hessie Knows, MD;  Location: [facility] ORS;  Service: Orthopedics;  Laterality: N/A;  . LEFT HEART CATHETERIZATION WITH CORONARY ANGIOGRAM N/A 09/29/2014   Procedure: LEFT HEART CATHETERIZATION WITH CORONARY ANGIOGRAM;  Surgeon: Clent Demark, MD;  Location: [place] CATH [facility];  Service: Cardiovascular;  Laterality: N/A;  . PORTA CATH INSERTION N/A 01/02/2019   Procedure: PORTA CATH INSERTION;  Surgeon: Algernon Huxley, MD;  Location: [place] CV LAB;  Service: Cardiovascular;  Laterality: N/A;  . PORTACATH PLACEMENT Right 10/28/2018   Procedure: INSERTION PORT-A-CATH;  Surgeon: Jules Husbands, MD;  Location: [facility] ORS;  Service: General;  Laterality: Right;    SOCIAL HISTORY: Social History   Socioeconomic History  . Marital status: Married    Spouse name: [name]   . Number of children: 3  . Years of education: Not on file  . Highest education level: Not on file  Occupational History  . Occupation: Retired    Comment: Chief Technology Officer   Tobacco Use  . Smoking status: Former Smoker    Packs/day: 1.00    Years: 39.00    Pack years: 39.00    Types: Cigarettes    Start date: 12/21/1978    Quit date: 10/16/2018    Years since quitting: 1.8  . Smokeless tobacco: Never Used  Vaping Use  . Vaping Use: Never used  Substance and Sexual Activity    . Alcohol use: Yes    Alcohol/week: 0.0 standard drinks    Comment: occassional - approx 1 every 2 weeks   . Drug use: No  . Sexual activity: Yes    Birth control/protection: None  Other Topics Concern  . Not on file  Social History Narrative  . Not on file   Social Determinants of Health   Financial Resource Strain:   . Difficulty of Paying Living Expenses: Not on file  Food Insecurity:   . Worried About Charity fundraiser in the Last Year: Not on file  . Ran Out of Food in the Last Year: Not on file  Transportation Needs:   . Lack of Transportation (Medical): Not on file  . Lack of Transportation (Non-Medical): Not on file  Physical Activity:   . Days of Exercise per Week: Not on file  . Minutes of Exercise per Session: Not on file  Stress:   . Feeling of Stress : Not on file  Social Connections:   . Frequency of Communication with Friends and Family: Not on file  . Frequency of Social Gatherings with Friends and Family: Not on file  . Attends Religious Services: Not on file  . Active Member of Clubs or Organizations: Not on file  . Attends Archivist Meetings: Not on file  . Marital  Status: Not on file  Intimate Partner Violence:   . Fear of Current or Ex-Partner: Not on file  . Emotionally Abused: Not on file  . Physically Abused: Not on file  . Sexually Abused: Not on file    FAMILY HISTORY: Family History  Problem Relation Age of Onset  . Alzheimer's disease Mother   . Colon cancer Father   . Breast cancer Neg Hx     ALLERGIES:  has No Known Allergies.  MEDICATIONS:  Current Outpatient Medications  Medication Sig Dispense Refill  . acetaminophen (TYLENOL) 500 MG tablet Take 500 mg by mouth 2 (two) times daily as needed for moderate pain or headache.    Huey Bienenstock (TECENTRIQ IV) Inject 1 Dose into the vein every 30 (thirty) days.    . Cholecalciferol (DIALYVITE VITAMIN D 5000) 125 MCG (5000 UT) capsule Take 5,000 Units by mouth daily.    .  diphenhydrAMINE-zinc acetate (BENADRYL EXTRA STRENGTH) cream Apply 1 application topically 3 (three) times daily as needed for itching. 28.4 g 0  . HYDROcodone-acetaminophen (NORCO/VICODIN) 5-325 MG tablet Take 1 tablet by mouth every 6 (six) hours as needed for moderate pain. 90 tablet 0  . lidocaine-prilocaine (EMLA) cream Apply 1 application topically daily as needed (port access). 30 g 2  . loperamide (IMODIUM A-D) 2 MG tablet Take 2 mg by mouth 4 (four) times daily as needed for diarrhea or loose stools.     [name] Kitchen omeprazole (PRILOSEC) 40 MG capsule Take 1 capsule (40 mg total) by mouth daily. 90 capsule 1  . senna (SENOKOT) 8.6 MG TABS tablet Take 2 tablets (17.2 mg total) by mouth daily. 120 tablet 0  . sertraline (ZOLOFT) 50 MG tablet Take 1.5 tablets (75 mg total) by mouth daily. 45 tablet 6  . vitamin B-12 (CYANOCOBALAMIN) 1000 MCG tablet Take 1,000 mcg by mouth daily.    . Zoledronic Acid (ZOMETA IV) Inject 1 Dose into the vein every 30 (thirty) days.    . megestrol (MEGACE) 400 MG/10ML suspension Take 10 mLs (400 mg total) by mouth 2 (two) times daily. (Patient not taking: Reported on 09/02/2020) 240 mL 1  . nitroGLYCERIN (NITROSTAT) 0.4 MG SL tablet Place 1 tablet (0.4 mg total) under the tongue every 5 (five) minutes x 3 doses as needed for chest pain. (Patient not taking: Reported on 06/25/2020) 25 tablet 12  . polyethylene glycol (MIRALAX / GLYCOLAX) packet Take 17 g by mouth daily as needed for mild constipation.  (Patient not taking: Reported on 09/02/2020)     No current facility-administered medications for this visit.   Facility-Administered Medications Ordered in Other Visits  Medication Dose Route Frequency Provider Last Rate Last Admin  . heparin lock flush 100 unit/mL  500 Units Intravenous Once Earlie Server, MD      . heparin lock flush 100 unit/mL  500 Units Intravenous Once Earlie Server, MD      . sodium chloride flush (NS) 0.9 % injection 10 mL  10 mL Intravenous PRN Earlie Server, MD   10  mL at 01/09/19 0820  . sodium chloride flush (NS) 0.9 % injection 10 mL  10 mL Intravenous PRN Earlie Server, MD   10 mL at 01/10/19 1400     PHYSICAL EXAMINATION: ECOG PERFORMANCE STATUS: 1 - Symptomatic but completely ambulatory Vitals:   09/02/20 1421  BP: 95/70  Pulse: 84  Resp: 18  Temp: 98.6 F (37 C)   Filed Weights   09/02/20 1421  Weight: 137 lb (62.1 kg)  Physical Exam Constitutional:      General: She is not in acute distress.    Appearance: She is not diaphoretic.     Comments: Patient sits in the wheelchair.  HENT:     Head: Normocephalic and atraumatic.     Nose: Nose normal.     Mouth/Throat:     Pharynx: No oropharyngeal exudate.  Eyes:     General: No scleral icterus.    Pupils: Pupils are equal, round, and reactive to light.  Cardiovascular:     Rate and Rhythm: Normal rate and regular rhythm.     Heart sounds: No murmur heard.   Pulmonary:     Effort: Pulmonary effort is normal. No respiratory distress.     Breath sounds: No rales.  Chest:     Chest wall: No tenderness.  Abdominal:     General: There is no distension.     Palpations: Abdomen is soft.     Tenderness: There is no abdominal tenderness.  Musculoskeletal:        General: Normal range of motion.     Cervical back: Normal range of motion and neck supple.  Skin:    General: Skin is warm and dry.     Findings: No erythema.  Neurological:     Mental Status: She is alert and oriented to person, place, and time.     Cranial Nerves: No cranial nerve deficit.     Motor: No abnormal muscle tone.     Coordination: Coordination normal.  Psychiatric:        Mood and Affect: Affect normal.        LABORATORY DATA:  I have reviewed the data as listed Lab Results  Component Value Date   WBC 6.6 09/02/2020   HGB 11.3 (L) 09/02/2020   HCT 32.8 (L) 09/02/2020   MCV 87.2 09/02/2020   PLT 179 09/02/2020   Recent Labs    07/16/20 0904 08/12/20 1315 09/02/20 1358  NA 136 134* 137  K  4.0 4.0 3.5  CL 104 104 105  CO2 24 23 24   GLUCOSE 103* 98 103*  BUN 16 13 18   CREATININE 0.76 0.81 0.68  CALCIUM 8.9 8.8* 8.3*  GFRNONAA >60 >60 >60  GFRAA >60 >60 >60  PROT 7.2 7.4 6.8  ALBUMIN 4.0 4.0 3.7  AST 14* 17 14*  ALT 9 12 11   ALKPHOS 43 49 49  BILITOT 0.6 0.5 0.5    RADIOGRAPHIC STUDIES: I have personally reviewed the radiological images as listed and agreed with the findings in the report.  MR Brain W Wo Contrast  Result Date: 08/15/2020 CLINICAL DATA:  75 year old female with small cell lung cancer status post prophylactic cranial radiation in May and June 2020. Restaging. EXAM: MRI HEAD WITHOUT AND WITH CONTRAST TECHNIQUE: Multiplanar, multiecho pulse sequences of the brain and surrounding structures were obtained without and with intravenous contrast. CONTRAST:  87mL GADAVIST GADOBUTROL 1 MMOL/ML IV SOLN COMPARISON:  Brain MRI 04/12/2020 and earlier. FINDINGS: Brain: No abnormal enhancement identified. No midline shift, mass effect, or evidence of intracranial mass lesion. No dural thickening. Cerebral volume appears stable. No restricted diffusion to suggest acute infarction. No ventriculomegaly, extra-axial collection or acute intracranial hemorrhage. Cervicomedullary junction and pituitary are within normal limits. Confluent bilateral cerebral white matter T2 and FLAIR hyperintensity appears stable and compatible with sequelae of whole brain radiation. Less pronounced T2 heterogeneity in the bilateral deep gray nuclei and brainstem is stable. One or 2 punctate areas of gliosis in the  left cerebellar hemisphere appears stable. No cortical encephalomalacia or chronic cerebral blood products identified. Vascular: Major intracranial vascular flow voids are stable. The major dural venous sinuses are enhancing and appear to be patent. Skull and upper cervical spine: Negative visible cervical spine and spinal cord. Normal visible bone marrow signal. Sinuses/Orbits: Stable and  negative. Other: Mastoids remain well pneumatized. Visible internal auditory structures appear normal. Scalp and face appear negative. IMPRESSION: Stable appearance status post whole brain radiation. No metastatic disease or acute intracranial abnormality. Electronically Signed   By: Genevie Ann M.D.   On: 08/15/2020 07:13   CT CHEST ABDOMEN PELVIS W CONTRAST  Result Date: 07/26/2020 CLINICAL DATA:  Follow-up small cell lung cancer EXAM: CT CHEST, ABDOMEN, AND PELVIS WITH CONTRAST TECHNIQUE: Multidetector CT imaging of the chest, abdomen and pelvis was performed following the standard protocol during bolus administration of intravenous contrast. CONTRAST:  117mL OMNIPAQUE IOHEXOL 300 MG/ML  SOLN COMPARISON:  04/29/2020 FINDINGS: CT CHEST FINDINGS Cardiovascular: The heart is normal in size. No pericardial effusion. No evidence of thoracic aortic aneurysm. Atherosclerotic calcifications of the aortic arch. Three vessel coronary atherosclerosis. Right chest port terminates at the cavoatrial junction. Mediastinum/Nodes: No discrete mediastinal lymphadenopathy. Very mild soft tissue thickening in the subcarinal region (series 2/image 27) and along the left lateral aspect of the mid esophagus (series 2/image 23), although similar to priors, and therefore not considered suspicious for true nodal tissue on the current study. No suspicious hilar or axillary lymphadenopathy. Visualized thyroid is unremarkable. Lungs/Pleura: Moderate centrilobular and paraseptal emphysematous changes, upper lung predominant. Mild subpleural patchy opacities in the posterior left lower lobe (series 4/image 49), unchanged, favoring atelectasis/scarring. New mildly irregular subpleural opacity in the lateral right lower lobe, including a 12 mm irregular nodular component anteriorly (series 4/image 88), nonspecific/indeterminate but warranting attention on follow-up. Otherwise, no suspicious pulmonary nodules. Stable calcified granulomata in the  right upper lobe and right middle lobe, benign. No pleural effusion or pneumothorax. Musculoskeletal: Interval vertebral augmentation at T7. Mild to moderate inferior endplate compression fracture deformity at T4 (sagittal image 108), unchanged. Mild inferior endplate changes at T8, unchanged. Mild degenerative changes of the thoracic spine. Old upper sternal fracture (sagittal image 100). CT ABDOMEN PELVIS FINDINGS Hepatobiliary: Liver is within normal limits. No suspicious/enhancing hepatic lesions. Status post cholecystectomy. No intrahepatic or extrahepatic ductal dilatation. Pancreas: Within normal limits, noting mild parenchymal atrophy. Spleen: Calcified granuloma anteriorly.  Otherwise unremarkable. Adrenals/Urinary Tract: Adrenal glands are within normal limits. Kidneys are within normal limits.  No hydronephrosis. Low-lying bladder with mild anterior wall thickening (series 2/image 101). Stomach/Bowel: Stomach is within normal limits. No evidence of bowel obstruction. Appendix is not discretely visualized. Two right paramidline ventral hernias which both contain loops of nondilated small bowel on the current study (series 7/images 27 and 32). Mild wall thickening involving the ascending colon (series 2/image 69), but without convincing inflammatory changes. Left colon is unremarkable. Vascular/Lymphatic: No evidence of abdominal aortic aneurysm. Atherosclerotic calcifications of the abdominal aorta and branch vessels. No suspicious abdominopelvic lymphadenopathy. Reproductive: Uterus is within normal limits. Bilateral ovaries are unremarkable. Other: No abdominopelvic ascites. Musculoskeletal: Visualized osseous structures are within normal limits. IMPRESSION: Mildly irregular subpleural opacity in the lateral right lower lobe with a 12 mm nodular component. This appearance is nonspecific and certainly not typical for neoplasm, but warrants attention on follow-up. No findings specific for recurrent or  metastatic disease. No suspicious lymphadenopathy in the chest, abdomen, or pelvis. Mild colonic wall thickening involving the ascending colon, but without  convincing inflammatory changes, nonspecific. Correlate for infectious/inflammatory colitis. Two right paramidline ventral hernias, both of which now contain loops of nondilated small bowel, without evidence of obstruction. Additional stable ancillary findings as above. Electronically Signed   By: Julian Hy M.D.   On: 07/26/2020 07:57     ASSESSMENT & PLAN:  1. Encounter for antineoplastic immunotherapy   2. Small cell lung cancer ([place])   3. Osteopenia, unspecified location   4. Ventral hernia without obstruction or gangrene    #Extensive small cell lung cancer S/p 4 cycles of Carboplatin, Etoposide and Tecentriq.   Status post chest and whole brain radiation.  Currently on Tecentriq maintenance. Patient has been doing well clinically. Labs reviewed and discussed with patient Proceed with Tecentriq. MRI brain was independently reviewed by me and discussed with patient.  Stable disease .CT chest abdomen pelvis was independently reviewed by me and discussed with patient.  No definitive evidence of disease recurrence or metastatic disease. There is mildly irregular subpleural opacity in the lateral right lower lobe with a 12 mm nodular component.  Nonspecific but warrants attention on follow-up. [name] Kitchen  #Ventral hernia, discussed with patient recommend her to discuss with primary care provider to be referred to surgeon for further discussion of need of hernia repair  #Osteopenia continue calcium and vitamin D supplementation. We will arrange patient to receive Zometa at the next visit. Follow up in 3 weeks for eval with next cycle of tecentriq    Earlie Server, MD, PhD

## 2020-09-23 ENCOUNTER — Inpatient Hospital Stay: Payer: Medicare Other

## 2020-09-23 ENCOUNTER — Inpatient Hospital Stay: Payer: Medicare Other | Attending: Oncology

## 2020-09-23 ENCOUNTER — Other Ambulatory Visit: Payer: Self-pay

## 2020-09-23 ENCOUNTER — Encounter: Payer: Self-pay | Admitting: Oncology

## 2020-09-23 ENCOUNTER — Inpatient Hospital Stay (HOSPITAL_BASED_OUTPATIENT_CLINIC_OR_DEPARTMENT_OTHER): Payer: Medicare Other | Admitting: Oncology

## 2020-09-23 VITALS — BP 94/67 | HR 81 | Temp 96.3°F | Resp 18 | Wt 141.2 lb

## 2020-09-23 DIAGNOSIS — Z79899 Other long term (current) drug therapy: Secondary | ICD-10-CM | POA: Insufficient documentation

## 2020-09-23 DIAGNOSIS — M8580 Other specified disorders of bone density and structure, unspecified site: Secondary | ICD-10-CM | POA: Insufficient documentation

## 2020-09-23 DIAGNOSIS — I959 Hypotension, unspecified: Secondary | ICD-10-CM | POA: Insufficient documentation

## 2020-09-23 DIAGNOSIS — E860 Dehydration: Secondary | ICD-10-CM

## 2020-09-23 DIAGNOSIS — Z5112 Encounter for antineoplastic immunotherapy: Secondary | ICD-10-CM | POA: Insufficient documentation

## 2020-09-23 DIAGNOSIS — C3490 Malignant neoplasm of unspecified part of unspecified bronchus or lung: Secondary | ICD-10-CM

## 2020-09-23 DIAGNOSIS — Z87891 Personal history of nicotine dependence: Secondary | ICD-10-CM | POA: Diagnosis not present

## 2020-09-23 DIAGNOSIS — R5383 Other fatigue: Secondary | ICD-10-CM

## 2020-09-23 DIAGNOSIS — R413 Other amnesia: Secondary | ICD-10-CM

## 2020-09-23 LAB — CBC WITH DIFFERENTIAL/PLATELET
Abs Immature Granulocytes: 0.06 10*3/uL (ref 0.00–0.07)
Basophils Absolute: 0 10*3/uL (ref 0.0–0.1)
Basophils Relative: 1 %
Eosinophils Absolute: 0.2 10*3/uL (ref 0.0–0.5)
Eosinophils Relative: 3 %
HCT: 34.3 % — ABNORMAL LOW (ref 36.0–46.0)
Hemoglobin: 11.7 g/dL — ABNORMAL LOW (ref 12.0–15.0)
Immature Granulocytes: 1 %
Lymphocytes Relative: 18 %
Lymphs Abs: 1.3 10*3/uL (ref 0.7–4.0)
MCH: 30 pg (ref 26.0–34.0)
MCHC: 34.1 g/dL (ref 30.0–36.0)
MCV: 87.9 fL (ref 80.0–100.0)
Monocytes Absolute: 0.9 10*3/uL (ref 0.1–1.0)
Monocytes Relative: 12 %
Neutro Abs: 4.6 10*3/uL (ref 1.7–7.7)
Neutrophils Relative %: 65 %
Platelets: 167 10*3/uL (ref 150–400)
RBC: 3.9 MIL/uL (ref 3.87–5.11)
RDW: 13.7 % (ref 11.5–15.5)
WBC: 7.1 10*3/uL (ref 4.0–10.5)
nRBC: 0 % (ref 0.0–0.2)

## 2020-09-23 LAB — COMPREHENSIVE METABOLIC PANEL
ALT: 11 U/L (ref 0–44)
AST: 17 U/L (ref 15–41)
Albumin: 3.8 g/dL (ref 3.5–5.0)
Alkaline Phosphatase: 47 U/L (ref 38–126)
Anion gap: 8 (ref 5–15)
BUN: 14 mg/dL (ref 8–23)
CO2: 24 mmol/L (ref 22–32)
Calcium: 8.6 mg/dL — ABNORMAL LOW (ref 8.9–10.3)
Chloride: 106 mmol/L (ref 98–111)
Creatinine, Ser: 0.75 mg/dL (ref 0.44–1.00)
GFR calc Af Amer: >60 mL/min (ref >60)
GFR calc non Af Amer: >60 mL/min (ref >60)
Glucose, Bld: 91 mg/dL (ref 70–99)
Potassium: 4 mmol/L (ref 3.5–5.1)
Sodium: 138 mmol/L (ref 135–145)
Total Bilirubin: 0.6 mg/dL (ref 0.3–1.2)
Total Protein: 7 g/dL (ref 6.5–8.1)

## 2020-09-23 LAB — TSH: TSH: 1.58 u[IU]/mL (ref 0.350–4.500)

## 2020-09-23 LAB — T4, FREE: Free T4: 0.95 ng/dL (ref 0.61–1.12)

## 2020-09-23 LAB — CORTISOL: Cortisol, Plasma: 8 ug/dL

## 2020-09-23 MED ORDER — HEPARIN SOD (PORK) LOCK FLUSH 100 UNIT/ML IV SOLN
500.0000 [IU] | Freq: Once | INTRAVENOUS | Status: AC
  Administered 2020-09-23: 500 [IU] via INTRAVENOUS
  Filled 2020-09-23: qty 5

## 2020-09-23 MED ORDER — HEPARIN SOD (PORK) LOCK FLUSH 100 UNIT/ML IV SOLN
500.0000 [IU] | Freq: Once | INTRAVENOUS | Status: DC | PRN
  Filled 2020-09-23: qty 5

## 2020-09-23 MED ORDER — SODIUM CHLORIDE 0.9 % IV SOLN
Freq: Once | INTRAVENOUS | Status: AC
  Filled 2020-09-23: qty 250

## 2020-09-23 MED ORDER — SODIUM CHLORIDE 0.9 % IV SOLN
1200.0000 mg | Freq: Once | INTRAVENOUS | Status: AC
  Administered 2020-09-23: 1200 mg via INTRAVENOUS
  Filled 2020-09-23: qty 20

## 2020-09-23 MED ORDER — HEPARIN SOD (PORK) LOCK FLUSH 100 UNIT/ML IV SOLN
INTRAVENOUS | Status: AC
  Filled 2020-09-23: qty 5

## 2020-09-23 MED ORDER — DEXAMETHASONE SODIUM PHOSPHATE 10 MG/ML IJ SOLN
10.0000 mg | Freq: Once | INTRAMUSCULAR | Status: AC
  Administered 2020-09-23: 10 mg via INTRAVENOUS
  Filled 2020-09-23: qty 1

## 2020-09-23 MED ORDER — ZOLEDRONIC ACID 4 MG/100ML IV SOLN
4.0000 mg | Freq: Once | INTRAVENOUS | Status: AC
  Administered 2020-09-23: 4 mg via INTRAVENOUS
  Filled 2020-09-23: qty 100

## 2020-09-23 MED ORDER — SODIUM CHLORIDE 0.9% FLUSH
10.0000 mL | INTRAVENOUS | Status: DC | PRN
  Administered 2020-09-23: 10 mL via INTRAVENOUS
  Filled 2020-09-23: qty 10

## 2020-09-23 NOTE — Progress Notes (Signed)
1000: Per Dr. Tasia Catchings okay to proceed with Zometa with Calcium 8.6.

## 2020-09-23 NOTE — Progress Notes (Signed)
Pt here for follow up. Pt concerned about spider veins on ankles.

## 2020-09-23 NOTE — Progress Notes (Signed)
Hematology/Oncology Follow up note [facility] [phone] [phone]   Patient Care Team: Glean Hess, MD as PCP - General (Internal Medicine) Charolette Forward, MD as Consulting Physician (Cardiology) Telford Nab, RN as Registered Nurse [name], MD as Radiation Oncologist (Radiation Oncology)  REFERRING PROVIDER: Dr. Felicie Morn REASON FOR VISIT:  Follow-up for small cell lung cancer,   HISTORY OF PRESENTING ILLNESS:  Kristi [name] is a  74 y.o.  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  10/20/2018 CT chest with contrast showed large mediastinal mass involving both hilar, left greater than right, consistent with lung carcinoma, The mass causes narrowing of the left mainstem bronchus with resultant volume loss on the left and a mediastinal shift to the left.  Moderate size left pleural effusion. 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  # Initial MRI brain negative.  # Nov 2019- Jan 2020 s/p 4 cycles of Carbo/Etoposide/Tecentriq #  01/24/2019 interim CT scan done which showed continued positive response to therapy with continued reduction in mediastinal adenopathy.  No residual measurable left lung mass. CT findings of acute emphysematous cystitis. Urology did not feel that patient has pyelonephritis and recommend treatment with antibiotics because patient's immunocompromise. Patient finished treatment.   # 11/02/2018-01/09/2019 Chemotherapy carboplatin + Etoposide + tecentriq # Consolidation chest radiation and whole brain radiation finished in May 2020 # 04/25/2019 resume Tecentriq every 3 weeks. # Patient had a fall from her stairs on 01/19/2020. In the emergency room she had CT chest abdomen pelvis CT, CT head without contrast, CT cervical spine without contrast. No CT evidence for acute  intracranial abnormality, degenerative changes of cervical spine..  No CT evidence of acute thoracic abdomen injury.  Severe compression fracture of T7, subacute.  # kyphoplasty procedure on 03/01/2020.  T7 biopsy negative for cancer.  INTERVAL HISTORY Kristi [name] is a 74 y.o. female who has above history reviewed by me today presents for evaluation prior to immunotherapy for treatment of extensive small cell lung cancer. Patient is on immunotherapy maintenance. Overall she is doing well. Blood pressure is 94/67 today.  Recently blood pressure has been borderline.  Occasionally patient feels lightheaded when she stands up quickly.  She also admits that she has not drank much water this morning.  No other new complaints.  She has gained weight since last visit. ..  Review of Systems  Constitutional: Positive for fatigue. Negative for appetite change, chills and fever.  HENT:   Negative for hearing loss and voice change.   Eyes: Negative for eye problems.  Respiratory: Negative for chest tightness, cough and shortness of breath.   Cardiovascular: Negative for chest pain.  Gastrointestinal: Negative for abdominal distention, abdominal pain, blood in stool, diarrhea and nausea.  Endocrine: Negative for hot flashes.  Genitourinary: Negative for difficulty urinating and frequency.   Musculoskeletal: Positive for back pain. Negative for arthralgias.  Skin: Negative for itching and rash.  Neurological: Negative for extremity weakness.  Hematological: Negative for adenopathy.  Psychiatric/Behavioral: Negative for confusion and depression. The patient is not nervous/anxious.        Forgetful     MEDICAL HISTORY:  Past Medical History:  Diagnosis Date  . Acute MI, inferoposterior wall ([place]) 09/30/2014  . Claustrophobia   . Coronary artery disease   . GERD (gastroesophageal reflux disease)   . Hypercholesteremia   . Hypertension   . MI,  old   . Pneumonia   . Small cell lung cancer in  adult [facility]) 10/27/2018    SURGICAL HISTORY: Past Surgical History:  Procedure Laterality Date  . APPENDECTOMY     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  . BLADDER NECK SUSPENSION    . CHOLECYSTECTOMY N/A 08/14/2018   Procedure: LAPAROSCOPIC CHOLECYSTECTOMY;  Surgeon: Jules Husbands, MD;  Location: [facility] ORS;  Service: General;  Laterality: N/A;  . CHOLECYSTECTOMY  11/2018  . CORONARY ANGIOPLASTY  09/29/2014  . CORONARY STENT PLACEMENT    . ENDOBRONCHIAL ULTRASOUND N/A 10/21/2018   Procedure: ENDOBRONCHIAL ULTRASOUND;  Surgeon: Laverle Hobby, MD;  Location: [facility] ORS;  Service: Pulmonary;  Laterality: N/A;  . HERNIA REPAIR    . IR FLUORO GUIDE CV LINE RIGHT  12/19/2018  . KYPHOPLASTY N/A 03/01/2020   Procedure: T7 KYPHOPLASTY;  Surgeon: Hessie Knows, MD;  Location: [facility] ORS;  Service: Orthopedics;  Laterality: N/A;  . LEFT HEART CATHETERIZATION WITH CORONARY ANGIOGRAM N/A 09/29/2014   Procedure: LEFT HEART CATHETERIZATION WITH CORONARY ANGIOGRAM;  Surgeon: Clent Demark, MD;  Location: [place] CATH LAB;  Service: Cardiovascular;  Laterality: N/A;  . PORTA CATH INSERTION N/A 01/02/2019   Procedure: PORTA CATH INSERTION;  Surgeon: Algernon Huxley, MD;  Location: [place] CV LAB;  Service: Cardiovascular;  Laterality: N/A;  . PORTACATH PLACEMENT Right 10/28/2018   Procedure: INSERTION PORT-A-CATH;  Surgeon: Jules Husbands, MD;  Location: [facility] ORS;  Service: General;  Laterality: Right;    SOCIAL HISTORY: Social History   Socioeconomic History  . Marital status: Married    Spouse name: [name]   . Number of children: 3  . Years of education: Not on file  . Highest education level: Not on file  Occupational History  . Occupation: Retired    Comment: Chief Technology Officer   Tobacco Use  . Smoking status: Former Smoker    Packs/day: 1.00    Years: 39.00    Pack years: 39.00    Types: Cigarettes    Start date: 12/21/1978    Quit date: 10/16/2018    Years since quitting: 1.9  . Smokeless tobacco:  Never Used  Vaping Use  . Vaping Use: Never used  Substance and Sexual Activity  . Alcohol use: Yes    Alcohol/week: 0.0 standard drinks    Comment: occassional - approx 1 every 2 weeks   . Drug use: No  . Sexual activity: Yes    Birth control/protection: None  Other Topics Concern  . Not on file  Social History Narrative  . Not on file   Social Determinants of Health   Financial Resource Strain:   . Difficulty of Paying Living Expenses: Not on file  Food Insecurity:   . Worried About Charity fundraiser in the Last Year: Not on file  . Ran Out of Food in the Last Year: Not on file  Transportation Needs:   . Lack of Transportation (Medical): Not on file  . Lack of Transportation (Non-Medical): Not on file  Physical Activity:   . Days of Exercise per Week: Not on file  . Minutes of Exercise per Session: Not on file  Stress:   . Feeling of Stress : Not on file  Social Connections:   . Frequency of Communication with Friends and Family: Not on file  . Frequency of Social Gatherings with Friends and Family: Not on file  . Attends Religious Services: Not on file  . Active Member of Clubs or Organizations: Not on file  .  Attends Archivist Meetings: Not on file  . Marital Status: Not on file  Intimate Partner Violence:   . Fear of Current or Ex-Partner: Not on file  . Emotionally Abused: Not on file  . Physically Abused: Not on file  . Sexually Abused: Not on file    FAMILY HISTORY: Family History  Problem Relation Age of Onset  . Alzheimer's disease Mother   . Colon cancer Father   . Breast cancer Neg Hx     ALLERGIES:  has No Known Allergies.  MEDICATIONS:  Current Outpatient Medications  Medication Sig Dispense Refill  . acetaminophen (TYLENOL) 500 MG tablet Take 500 mg by mouth 2 (two) times daily as needed for moderate pain or headache.    Huey Bienenstock (TECENTRIQ IV) Inject 1 Dose into the vein every 30 (thirty) days.    . Cholecalciferol  (DIALYVITE VITAMIN D 5000) 125 MCG (5000 UT) capsule Take 5,000 Units by mouth daily.    . diphenhydrAMINE-zinc acetate (BENADRYL EXTRA STRENGTH) cream Apply 1 application topically 3 (three) times daily as needed for itching. 28.4 g 0  . HYDROcodone-acetaminophen (NORCO/VICODIN) 5-325 MG tablet Take 1 tablet by mouth every 6 (six) hours as needed for moderate pain. 90 tablet 0  . lidocaine-prilocaine (EMLA) cream Apply 1 application topically daily as needed (port access). 30 g 2  . loperamide (IMODIUM A-D) 2 MG tablet Take 2 mg by mouth 4 (four) times daily as needed for diarrhea or loose stools.     . megestrol (MEGACE) 400 MG/10ML suspension Take 10 mLs (400 mg total) by mouth 2 (two) times daily. (Patient not taking: Reported on 09/02/2020) 240 mL 1  . nitroGLYCERIN (NITROSTAT) 0.4 MG SL tablet Place 1 tablet (0.4 mg total) under the tongue every 5 (five) minutes x 3 doses as needed for chest pain. (Patient not taking: Reported on 06/25/2020) 25 tablet 12  . omeprazole (PRILOSEC) 40 MG capsule Take 1 capsule (40 mg total) by mouth daily. 90 capsule 1  . polyethylene glycol (MIRALAX / GLYCOLAX) packet Take 17 g by mouth daily as needed for mild constipation.  (Patient not taking: Reported on 09/02/2020)    . senna (SENOKOT) 8.6 MG TABS tablet Take 2 tablets (17.2 mg total) by mouth daily. 120 tablet 0  . sertraline (ZOLOFT) 50 MG tablet Take 1.5 tablets (75 mg total) by mouth daily. 45 tablet 6  . vitamin B-12 (CYANOCOBALAMIN) 1000 MCG tablet Take 1,000 mcg by mouth daily.    . Zoledronic Acid (ZOMETA IV) Inject 1 Dose into the vein every 30 (thirty) days.     No current facility-administered medications for this visit.   Facility-Administered Medications Ordered in Other Visits  Medication Dose Route Frequency Provider Last Rate Last Admin  . heparin lock flush 100 unit/mL  500 Units Intravenous Once Earlie Server, MD      . heparin lock flush 100 unit/mL  500 Units Intravenous Once Earlie Server, MD        . sodium chloride flush (NS) 0.9 % injection 10 mL  10 mL Intravenous PRN Earlie Server, MD   10 mL at 01/09/19 0820  . sodium chloride flush (NS) 0.9 % injection 10 mL  10 mL Intravenous PRN Earlie Server, MD   10 mL at 01/10/19 1400     PHYSICAL EXAMINATION: ECOG PERFORMANCE STATUS: 1 - Symptomatic but completely ambulatory Vitals:   09/23/20 0906  BP: 94/67  Pulse: 81  Resp: 18  Temp: (!) 96.3 F (35.7 C)  SpO2:  94%   Filed Weights   09/23/20 0906  Weight: 141 lb 3.2 oz (64 kg)   Physical Exam Constitutional:      General: She is not in acute distress.    Appearance: She is not diaphoretic.     Comments: Patient sits in the wheelchair.  HENT:     Head: Normocephalic and atraumatic.     Nose: Nose normal.     Mouth/Throat:     Pharynx: No oropharyngeal exudate.  Eyes:     General: No scleral icterus.    Pupils: Pupils are equal, round, and reactive to light.  Cardiovascular:     Rate and Rhythm: Normal rate and regular rhythm.     Heart sounds: No murmur heard.   Pulmonary:     Effort: Pulmonary effort is normal. No respiratory distress.     Breath sounds: No rales.  Chest:     Chest wall: No tenderness.  Abdominal:     General: There is no distension.     Palpations: Abdomen is soft.     Tenderness: There is no abdominal tenderness.  Musculoskeletal:        General: Normal range of motion.     Cervical back: Normal range of motion and neck supple.  Skin:    General: Skin is warm and dry.     Findings: No erythema.  Neurological:     Mental Status: She is alert and oriented to person, place, and time.     Cranial Nerves: No cranial nerve deficit.     Motor: No abnormal muscle tone.     Coordination: Coordination normal.  Psychiatric:        Mood and Affect: Affect normal.        LABORATORY DATA:  I have reviewed the data as listed Lab Results  Component Value Date   WBC 6.6 09/02/2020   HGB 11.3 (L) 09/02/2020   HCT 32.8 (L) 09/02/2020   MCV 87.2  09/02/2020   PLT 179 09/02/2020   Recent Labs    07/16/20 0904 08/12/20 1315 09/02/20 1358  NA 136 134* 137  K 4.0 4.0 3.5  CL 104 104 105  CO2 24 23 24   GLUCOSE 103* 98 103*  BUN 16 13 18   CREATININE 0.76 0.81 0.68  CALCIUM 8.9 8.8* 8.3*  GFRNONAA >60 >60 >60  GFRAA >60 >60 >60  PROT 7.2 7.4 6.8  ALBUMIN 4.0 4.0 3.7  AST 14* 17 14*  ALT 9 12 11   ALKPHOS 43 49 49  BILITOT 0.6 0.5 0.5    RADIOGRAPHIC STUDIES: I have personally reviewed the radiological images as listed and agreed with the findings in the report.  MR Brain W Wo Contrast  Result Date: 08/15/2020 CLINICAL DATA:  74 year old female with small cell lung cancer status post prophylactic cranial radiation in May and June 2020. Restaging. EXAM: MRI HEAD WITHOUT AND WITH CONTRAST TECHNIQUE: Multiplanar, multiecho pulse sequences of the brain and surrounding structures were obtained without and with intravenous contrast. CONTRAST:  66mL GADAVIST GADOBUTROL 1 MMOL/ML IV SOLN COMPARISON:  Brain MRI 04/12/2020 and earlier. FINDINGS: Brain: No abnormal enhancement identified. No midline shift, mass effect, or evidence of intracranial mass lesion. No dural thickening. Cerebral volume appears stable. No restricted diffusion to suggest acute infarction. No ventriculomegaly, extra-axial collection or acute intracranial hemorrhage. Cervicomedullary junction and pituitary are within normal limits. Confluent bilateral cerebral white matter T2 and FLAIR hyperintensity appears stable and compatible with sequelae of whole brain radiation. Less pronounced T2 heterogeneity  in the bilateral deep gray nuclei and brainstem is stable. One or 2 punctate areas of gliosis in the left cerebellar hemisphere appears stable. No cortical encephalomalacia or chronic cerebral blood products identified. Vascular: Major intracranial vascular flow voids are stable. The major dural venous sinuses are enhancing and appear to be patent. Skull and upper cervical spine:  Negative visible cervical spine and spinal cord. Normal visible bone marrow signal. Sinuses/Orbits: Stable and negative. Other: Mastoids remain well pneumatized. Visible internal auditory structures appear normal. Scalp and face appear negative. IMPRESSION: Stable appearance status post whole brain radiation. No metastatic disease or acute intracranial abnormality. Electronically Signed   By: Genevie Ann M.D.   On: 08/15/2020 07:13   CT CHEST ABDOMEN PELVIS W CONTRAST  Result Date: 07/26/2020 CLINICAL DATA:  Follow-up small cell lung cancer EXAM: CT CHEST, ABDOMEN, AND PELVIS WITH CONTRAST TECHNIQUE: Multidetector CT imaging of the chest, abdomen and pelvis was performed following the standard protocol during bolus administration of intravenous contrast. CONTRAST:  161mL OMNIPAQUE IOHEXOL 300 MG/ML  SOLN COMPARISON:  04/29/2020 FINDINGS: CT CHEST FINDINGS Cardiovascular: The heart is normal in size. No pericardial effusion. No evidence of thoracic aortic aneurysm. Atherosclerotic calcifications of the aortic arch. Three vessel coronary atherosclerosis. Right chest port terminates at the cavoatrial junction. Mediastinum/Nodes: No discrete mediastinal lymphadenopathy. Very mild soft tissue thickening in the subcarinal region (series 2/image 27) and along the left lateral aspect of the mid esophagus (series 2/image 23), although similar to priors, and therefore not considered suspicious for true nodal tissue on the current study. No suspicious hilar or axillary lymphadenopathy. Visualized thyroid is unremarkable. Lungs/Pleura: Moderate centrilobular and paraseptal emphysematous changes, upper lung predominant. Mild subpleural patchy opacities in the posterior left lower lobe (series 4/image 49), unchanged, favoring atelectasis/scarring. New mildly irregular subpleural opacity in the lateral right lower lobe, including a 12 mm irregular nodular component anteriorly (series 4/image 88), nonspecific/indeterminate but  warranting attention on follow-up. Otherwise, no suspicious pulmonary nodules. Stable calcified granulomata in the right upper lobe and right middle lobe, benign. No pleural effusion or pneumothorax. Musculoskeletal: Interval vertebral augmentation at T7. Mild to moderate inferior endplate compression fracture deformity at T4 (sagittal image 108), unchanged. Mild inferior endplate changes at T8, unchanged. Mild degenerative changes of the thoracic spine. Old upper sternal fracture (sagittal image 100). CT ABDOMEN PELVIS FINDINGS Hepatobiliary: Liver is within normal limits. No suspicious/enhancing hepatic lesions. Status post cholecystectomy. No intrahepatic or extrahepatic ductal dilatation. Pancreas: Within normal limits, noting mild parenchymal atrophy. Spleen: Calcified granuloma anteriorly.  Otherwise unremarkable. Adrenals/Urinary Tract: Adrenal glands are within normal limits. Kidneys are within normal limits.  No hydronephrosis. Low-lying bladder with mild anterior wall thickening (series 2/image 101). Stomach/Bowel: Stomach is within normal limits. No evidence of bowel obstruction. Appendix is not discretely visualized. Two right paramidline ventral hernias which both contain loops of nondilated small bowel on the current study (series 7/images 27 and 32). Mild wall thickening involving the ascending colon (series 2/image 69), but without convincing inflammatory changes. Left colon is unremarkable. Vascular/Lymphatic: No evidence of abdominal aortic aneurysm. Atherosclerotic calcifications of the abdominal aorta and branch vessels. No suspicious abdominopelvic lymphadenopathy. Reproductive: Uterus is within normal limits. Bilateral ovaries are unremarkable. Other: No abdominopelvic ascites. Musculoskeletal: Visualized osseous structures are within normal limits. IMPRESSION: Mildly irregular subpleural opacity in the lateral right lower lobe with a 12 mm nodular component. This appearance is nonspecific and  certainly not typical for neoplasm, but warrants attention on follow-up. No findings specific for recurrent or metastatic disease.  No suspicious lymphadenopathy in the chest, abdomen, or pelvis. Mild colonic wall thickening involving the ascending colon, but without convincing inflammatory changes, nonspecific. Correlate for infectious/inflammatory colitis. Two right paramidline ventral hernias, both of which now contain loops of nondilated small bowel, without evidence of obstruction. Additional stable ancillary findings as above. Electronically Signed   By: Julian Hy M.D.   On: 07/26/2020 07:57     ASSESSMENT & PLAN:  1. Encounter for antineoplastic immunotherapy   2. Small cell lung cancer ([place])   3. Osteopenia, unspecified location   4. Hypotension, unspecified hypotension type    #Extensive small cell lung cancer, S/p 4 cycles of Carboplatin, Etoposide and Tecentriq.   Status post chest and whole brain radiation.  Currently on Tecentriq maintenance. Labs are reviewed and discussed with patient. Counts acceptable to proceed with Tecentriq maintenance. MRI brain and CT scheduled for last time in August.  We will repeat CT scan in November 2021  # Hypotension, antihypertensive medications. Encourage patient to improve oral hydration.  Patient will be given IV normal saline 500 cc x 1 today. I will check cortisol level .  TSH and free T4 labs are pending.  #Osteopenia continue calcium and vitamin D supplementation.  On Zometa every 4 to 6 weeks, in the context of extensive small cell lung cancer.. Patient will receive Zometa today.  Follow up in 3 weeks for eval with next cycle of tecentriq    Earlie Server, MD, PhD

## 2020-09-29 DIAGNOSIS — Z23 Encounter for immunization: Secondary | ICD-10-CM | POA: Diagnosis not present

## 2020-10-02 ENCOUNTER — Encounter: Payer: Self-pay | Admitting: Radiation Oncology

## 2020-10-02 ENCOUNTER — Other Ambulatory Visit: Payer: Self-pay

## 2020-10-02 ENCOUNTER — Ambulatory Visit
Admission: RE | Admit: 2020-10-02 | Discharge: 2020-10-02 | Disposition: A | Discharge status: Home / Self Care | Payer: Medicare Other | Source: Ambulatory Visit | Attending: Radiation Oncology | Admitting: Radiation Oncology

## 2020-10-02 VITALS — Resp 16 | Wt 138.4 lb

## 2020-10-02 DIAGNOSIS — Z85118 Personal history of other malignant neoplasm of bronchus and lung: Secondary | ICD-10-CM | POA: Diagnosis not present

## 2020-10-02 DIAGNOSIS — Z923 Personal history of irradiation: Secondary | ICD-10-CM | POA: Diagnosis not present

## 2020-10-02 DIAGNOSIS — Z9221 Personal history of antineoplastic chemotherapy: Secondary | ICD-10-CM | POA: Diagnosis not present

## 2020-10-02 DIAGNOSIS — C3490 Malignant neoplasm of unspecified part of unspecified bronchus or lung: Secondary | ICD-10-CM

## 2020-10-02 NOTE — Progress Notes (Signed)
Radiation Oncology Follow up Note  Name: Kristi [name]   Date:   10/02/2020 MRN:  [id number] DOB: [birth year]    This 74 y.o. female presents to the clinic today for 2-month follow-up status post both concurrent chemoradiation therapy as well as PCI for extensive stage small cell lung cancer.  REFERRING PROVIDER: Glean Hess, MD  HPI: Patient is a 74 year old female now 63 months having completed both concurrent chemoradiation therapy as well as PCI for extensive stage small cell lung cancer.  She is seen today in routine follow-up and is doing well..  She is currently on Tecentriq maintenance tolerating it well.  Recent CT scan back in August shows mild irregular subpleural opacity in the lower right lobe with a 12 mm nodular component nonspecific certainly not typical for neoplasm.  No other findings suspicious for recurrent or metastatic disease.  MRI of her brain was normal.  COMPLICATIONS OF TREATMENT: none  FOLLOW UP COMPLIANCE: keeps appointments   PHYSICAL EXAM:  Resp 16   Wt 138 lb 6.4 oz (62.8 kg)   BMI 21.68 kg/m  Well-developed well-nourished patient in NAD. HEENT reveals PERLA, EOMI, discs not visualized.  Oral cavity is clear. No oral mucosal lesions are identified. Neck is clear without evidence of cervical or supraclavicular adenopathy. Lungs are clear to A&P. Cardiac examination is essentially unremarkable with regular rate and rhythm without murmur rub or thrill. Abdomen is benign with no organomegaly or masses noted. Motor sensory and DTR levels are equal and symmetric in the upper and lower extremities. Cranial nerves II through XII are grossly intact. Proprioception is intact. No peripheral adenopathy or edema is identified. No motor or sensory levels are noted. Crude visual fields are within normal range.  RADIOLOGY RESULTS: MRI and CT scan of the chest reviewed compatible with above-stated findings  PLAN: Present time patient is doing well no evidence of  disease by CT criteria.  She continues on maintenance Tecentriq by Dr. Tasia Catchings.  I have asked to see her back in 6 months for follow-up and then will start once year follow-up visits.  Patient comprehends my recommendations well.  I would like to take this opportunity to thank you for allowing me to participate in the care of your patient.[name], MD

## 2020-10-14 ENCOUNTER — Inpatient Hospital Stay: Payer: Medicare Other

## 2020-10-14 ENCOUNTER — Inpatient Hospital Stay (HOSPITAL_BASED_OUTPATIENT_CLINIC_OR_DEPARTMENT_OTHER): Payer: Medicare Other | Admitting: Oncology

## 2020-10-14 ENCOUNTER — Encounter: Payer: Self-pay | Admitting: Oncology

## 2020-10-14 ENCOUNTER — Other Ambulatory Visit: Payer: Self-pay

## 2020-10-14 VITALS — BP 103/73 | HR 101 | Temp 97.4°F | Resp 16 | Wt 139.2 lb

## 2020-10-14 DIAGNOSIS — Z5112 Encounter for antineoplastic immunotherapy: Secondary | ICD-10-CM

## 2020-10-14 DIAGNOSIS — C3490 Malignant neoplasm of unspecified part of unspecified bronchus or lung: Secondary | ICD-10-CM

## 2020-10-14 DIAGNOSIS — Z95828 Presence of other vascular implants and grafts: Secondary | ICD-10-CM

## 2020-10-14 DIAGNOSIS — M8580 Other specified disorders of bone density and structure, unspecified site: Secondary | ICD-10-CM | POA: Diagnosis not present

## 2020-10-14 DIAGNOSIS — Z87891 Personal history of nicotine dependence: Secondary | ICD-10-CM | POA: Diagnosis not present

## 2020-10-14 DIAGNOSIS — Z79899 Other long term (current) drug therapy: Secondary | ICD-10-CM | POA: Diagnosis not present

## 2020-10-14 DIAGNOSIS — I959 Hypotension, unspecified: Secondary | ICD-10-CM | POA: Diagnosis not present

## 2020-10-14 LAB — CBC WITH DIFFERENTIAL/PLATELET
Abs Immature Granulocytes: 0.06 10*3/uL (ref 0.00–0.07)
Basophils Absolute: 0.1 10*3/uL (ref 0.0–0.1)
Basophils Relative: 1 %
Eosinophils Absolute: 0.3 10*3/uL (ref 0.0–0.5)
Eosinophils Relative: 4 %
HCT: 38.5 % (ref 36.0–46.0)
Hemoglobin: 13.1 g/dL (ref 12.0–15.0)
Immature Granulocytes: 1 %
Lymphocytes Relative: 24 %
Lymphs Abs: 2 10*3/uL (ref 0.7–4.0)
MCH: 29.8 pg (ref 26.0–34.0)
MCHC: 34 g/dL (ref 30.0–36.0)
MCV: 87.7 fL (ref 80.0–100.0)
Monocytes Absolute: 1.3 10*3/uL — ABNORMAL HIGH (ref 0.1–1.0)
Monocytes Relative: 15 %
Neutro Abs: 4.6 10*3/uL (ref 1.7–7.7)
Neutrophils Relative %: 55 %
Platelets: 196 10*3/uL (ref 150–400)
RBC: 4.39 MIL/uL (ref 3.87–5.11)
RDW: 13.5 % (ref 11.5–15.5)
WBC: 8.2 10*3/uL (ref 4.0–10.5)
nRBC: 0 % (ref 0.0–0.2)

## 2020-10-14 LAB — COMPREHENSIVE METABOLIC PANEL
ALT: 12 U/L (ref 0–44)
AST: 24 U/L (ref 15–41)
Albumin: 4.2 g/dL (ref 3.5–5.0)
Alkaline Phosphatase: 48 U/L (ref 38–126)
Anion gap: 10 (ref 5–15)
BUN: 15 mg/dL (ref 8–23)
CO2: 22 mmol/L (ref 22–32)
Calcium: 9.2 mg/dL (ref 8.9–10.3)
Chloride: 102 mmol/L (ref 98–111)
Creatinine, Ser: 0.93 mg/dL (ref 0.44–1.00)
GFR, Estimated: >60 mL/min (ref >60)
Glucose, Bld: 75 mg/dL (ref 70–99)
Potassium: 3.8 mmol/L (ref 3.5–5.1)
Sodium: 134 mmol/L — ABNORMAL LOW (ref 135–145)
Total Bilirubin: 0.7 mg/dL (ref 0.3–1.2)
Total Protein: 7.8 g/dL (ref 6.5–8.1)

## 2020-10-14 MED ORDER — SODIUM CHLORIDE 0.9 % IV SOLN
1200.0000 mg | Freq: Once | INTRAVENOUS | Status: AC
  Administered 2020-10-14: 1200 mg via INTRAVENOUS
  Filled 2020-10-14: qty 20

## 2020-10-14 MED ORDER — SODIUM CHLORIDE 0.9% FLUSH
10.0000 mL | Freq: Once | INTRAVENOUS | Status: AC
  Administered 2020-10-14: 10 mL via INTRAVENOUS
  Filled 2020-10-14: qty 10

## 2020-10-14 MED ORDER — HEPARIN SOD (PORK) LOCK FLUSH 100 UNIT/ML IV SOLN
INTRAVENOUS | Status: AC
  Filled 2020-10-14: qty 15

## 2020-10-14 MED ORDER — DEXAMETHASONE SODIUM PHOSPHATE 10 MG/ML IJ SOLN
10.0000 mg | Freq: Once | INTRAMUSCULAR | Status: AC
  Administered 2020-10-14: 10 mg via INTRAVENOUS
  Filled 2020-10-14: qty 1

## 2020-10-14 MED ORDER — SODIUM CHLORIDE 0.9 % IV SOLN
Freq: Once | INTRAVENOUS | Status: AC
  Filled 2020-10-14: qty 250

## 2020-10-14 MED ORDER — HEPARIN SOD (PORK) LOCK FLUSH 100 UNIT/ML IV SOLN
500.0000 [IU] | Freq: Once | INTRAVENOUS | Status: AC
  Administered 2020-10-14: 500 [IU] via INTRAVENOUS
  Filled 2020-10-14: qty 5

## 2020-10-14 NOTE — Progress Notes (Signed)
Patient denies new problems/concerns today.   °

## 2020-10-14 NOTE — Progress Notes (Signed)
Hematology/Oncology Follow up note [facility] Telephone:([phone]) [phone] Fax:(336) [phone]   Patient Care Team: Glean Hess, MD as PCP - General (Internal Medicine) Charolette Forward, MD as Consulting Physician (Cardiology) Telford Nab, RN as Registered Nurse [name], MD as Radiation Oncologist (Radiation Oncology)  REFERRING PROVIDER: Dr. Felicie Morn REASON FOR VISIT:  Follow-up for small cell lung cancer,   HISTORY OF PRESENTING ILLNESS:  Kristi [name] is a  74 y.o.  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  10/20/2018 CT chest with contrast showed large mediastinal mass involving both hilar, left greater than right, consistent with lung carcinoma, The mass causes narrowing of the left mainstem bronchus with resultant volume loss on the left and a mediastinal shift to the left.  Moderate size left pleural effusion. 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  # Initial MRI brain negative.  # Nov 2019- Jan 2020 s/p 4 cycles of Carbo/Etoposide/Tecentriq #  01/24/2019 interim CT scan done which showed continued positive response to therapy with continued reduction in mediastinal adenopathy.  No residual measurable left lung mass. CT findings of acute emphysematous cystitis. Urology did not feel that patient has pyelonephritis and recommend treatment with antibiotics because patient's immunocompromise. Patient finished treatment.   # 11/02/2018-01/09/2019 Chemotherapy carboplatin + Etoposide + tecentriq # Consolidation chest radiation and whole brain radiation finished in May 2020 # 04/25/2019 resume Tecentriq every 3 weeks. # Patient had a fall from her stairs on 01/19/2020. In the emergency room she had CT chest abdomen pelvis CT, CT head without contrast, CT cervical spine without contrast. No CT evidence for acute  intracranial abnormality, degenerative changes of cervical spine..  No CT evidence of acute thoracic abdomen injury.  Severe compression fracture of T7, subacute.  # kyphoplasty procedure on 03/01/2020.  T7 biopsy negative for cancer.  INTERVAL HISTORY Kristi [name] is a 74 y.o. female who has above history reviewed by me today presents for evaluation prior to immunotherapy for treatment of extensive small cell lung cancer. Patient is on immunotherapy maintenance. She is doing well over all.  occasionally she experiences dizziness.  Appetite is fair.  No new complaints.  ..  Review of Systems  Constitutional: Positive for fatigue. Negative for appetite change, chills and fever.  HENT:   Negative for hearing loss and voice change.   Eyes: Negative for eye problems.  Respiratory: Negative for chest tightness, cough and shortness of breath.   Cardiovascular: Negative for chest pain.  Gastrointestinal: Negative for abdominal distention, abdominal pain, blood in stool, diarrhea and nausea.  Endocrine: Negative for hot flashes.  Genitourinary: Negative for difficulty urinating and frequency.   Musculoskeletal: Positive for back pain. Negative for arthralgias.  Skin: Negative for itching and rash.  Neurological: Negative for extremity weakness.  Hematological: Negative for adenopathy.  Psychiatric/Behavioral: Negative for confusion and depression. The patient is not nervous/anxious.        Forgetful     MEDICAL HISTORY:  Past Medical History:  Diagnosis Date  . Acute MI, inferoposterior wall ([place]) 09/30/2014  . Claustrophobia   . Coronary artery disease   . GERD (gastroesophageal reflux disease)   . Hypercholesteremia   . Hypertension   . MI, old   . Pneumonia   . 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 [facility]) 10/27/2018    SURGICAL HISTORY: Past Surgical History:  Procedure Laterality Date  . APPENDECTOMY  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  . BLADDER NECK SUSPENSION    .  CHOLECYSTECTOMY N/A 08/14/2018   Procedure: LAPAROSCOPIC CHOLECYSTECTOMY;  Surgeon: Jules Husbands, MD;  Location: [facility] ORS;  Service: General;  Laterality: N/A;  . CHOLECYSTECTOMY  11/2018  . CORONARY ANGIOPLASTY  09/29/2014  . CORONARY STENT PLACEMENT    . ENDOBRONCHIAL ULTRASOUND N/A 10/21/2018   Procedure: ENDOBRONCHIAL ULTRASOUND;  Surgeon: Laverle Hobby, MD;  Location: [facility] ORS;  Service: Pulmonary;  Laterality: N/A;  . HERNIA REPAIR    . IR FLUORO GUIDE CV LINE RIGHT  12/19/2018  . KYPHOPLASTY N/A 03/01/2020   Procedure: T7 KYPHOPLASTY;  Surgeon: Hessie Knows, MD;  Location: [facility] ORS;  Service: Orthopedics;  Laterality: N/A;  . LEFT HEART CATHETERIZATION WITH CORONARY ANGIOGRAM N/A 09/29/2014   Procedure: LEFT HEART CATHETERIZATION WITH CORONARY ANGIOGRAM;  Surgeon: Clent Demark, MD;  Location: [place] CATH LAB;  Service: Cardiovascular;  Laterality: N/A;  . PORTA CATH INSERTION N/A 01/02/2019   Procedure: PORTA CATH INSERTION;  Surgeon: Algernon Huxley, MD;  Location: [place] CV LAB;  Service: Cardiovascular;  Laterality: N/A;  . PORTACATH PLACEMENT Right 10/28/2018   Procedure: INSERTION PORT-A-CATH;  Surgeon: Jules Husbands, MD;  Location: [facility] ORS;  Service: General;  Laterality: Right;    SOCIAL HISTORY: Social History   Socioeconomic History  . Marital status: Married    Spouse name: [name]   . Number of children: 3  . Years of education: Not on file  . Highest education level: Not on file  Occupational History  . Occupation: Retired    Comment: Chief Technology Officer   Tobacco Use  . Smoking status: Former Smoker    Packs/day: 1.00    Years: 39.00    Pack years: 39.00    Types: Cigarettes    Start date: 12/21/1978    Quit date: 10/16/2018    Years since quitting: 1.9  . Smokeless tobacco: Never Used  Vaping Use  . Vaping Use: Never used  Substance and Sexual Activity  . Alcohol use: Yes    Alcohol/week: 0.0 standard drinks    Comment: occassional - approx 1 every 2  weeks   . Drug use: No  . Sexual activity: Yes    Birth control/protection: None  Other Topics Concern  . Not on file  Social History Narrative  . Not on file   Social Determinants of Health   Financial Resource Strain:   . Difficulty of Paying Living Expenses: Not on file  Food Insecurity:   . Worried About Charity fundraiser in the Last Year: Not on file  . Ran Out of Food in the Last Year: Not on file  Transportation Needs:   . Lack of Transportation (Medical): Not on file  . Lack of Transportation (Non-Medical): Not on file  Physical Activity:   . Days of Exercise per Week: Not on file  . Minutes of Exercise per Session: Not on file  Stress:   . Feeling of Stress : Not on file  Social Connections:   . Frequency of Communication with Friends and Family: Not on file  . Frequency of Social Gatherings with Friends and Family: Not on file  . Attends Religious Services: Not on file  . Active Member of Clubs or Organizations: Not on file  . Attends Archivist Meetings: Not on file  . Marital Status: Not on file  Intimate Partner Violence:   . Fear of Current or Ex-Partner: Not on file  . Emotionally  Abused: Not on file  . Physically Abused: Not on file  . Sexually Abused: Not on file    FAMILY HISTORY: Family History  Problem Relation Age of Onset  . Alzheimer's disease Mother   . Colon cancer Father   . Breast cancer Neg Hx     ALLERGIES:  has No Known Allergies.  MEDICATIONS:  Current Outpatient Medications  Medication Sig Dispense Refill  . acetaminophen (TYLENOL) 500 MG tablet Take 500 mg by mouth 2 (two) times daily as needed for moderate pain or headache.    Huey Bienenstock (TECENTRIQ IV) Inject 1 Dose into the vein every 30 (thirty) days.    . Cholecalciferol (DIALYVITE VITAMIN D 5000) 125 MCG (5000 UT) capsule Take 5,000 Units by mouth daily.    . diphenhydrAMINE-zinc acetate (BENADRYL EXTRA STRENGTH) cream Apply 1 application topically 3 (three)  times daily as needed for itching. 28.4 g 0  . HYDROcodone-acetaminophen (NORCO/VICODIN) 5-325 MG tablet Take 1 tablet by mouth every 6 (six) hours as needed for moderate pain. 90 tablet 0  . lidocaine-prilocaine (EMLA) cream Apply 1 application topically daily as needed (port access). 30 g 2  . loperamide (IMODIUM A-D) 2 MG tablet Take 2 mg by mouth 4 (four) times daily as needed for diarrhea or loose stools.     [name] Kitchen omeprazole (PRILOSEC) 40 MG capsule Take 1 capsule (40 mg total) by mouth daily. 90 capsule 1  . senna (SENOKOT) 8.6 MG TABS tablet Take 2 tablets (17.2 mg total) by mouth daily. 120 tablet 0  . sertraline (ZOLOFT) 50 MG tablet Take 1.5 tablets (75 mg total) by mouth daily. 45 tablet 6  . vitamin B-12 (CYANOCOBALAMIN) 1000 MCG tablet Take 1,000 mcg by mouth daily.    . Zoledronic Acid (ZOMETA IV) Inject 1 Dose into the vein every 30 (thirty) days.    . megestrol (MEGACE) 400 MG/10ML suspension Take 10 mLs (400 mg total) by mouth 2 (two) times daily. (Patient not taking: Reported on 09/02/2020) 240 mL 1  . nitroGLYCERIN (NITROSTAT) 0.4 MG SL tablet Place 1 tablet (0.4 mg total) under the tongue every 5 (five) minutes x 3 doses as needed for chest pain. (Patient not taking: Reported on 06/25/2020) 25 tablet 12  . polyethylene glycol (MIRALAX / GLYCOLAX) packet Take 17 g by mouth daily as needed for mild constipation.  (Patient not taking: Reported on 09/23/2020)     No current facility-administered medications for this visit.   Facility-Administered Medications Ordered in Other Visits  Medication Dose Route Frequency Provider Last Rate Last Admin  . heparin lock flush 100 unit/mL  500 Units Intravenous Once Earlie Server, MD      . heparin lock flush 100 unit/mL  500 Units Intravenous Once Earlie Server, MD      . sodium chloride flush (NS) 0.9 % injection 10 mL  10 mL Intravenous PRN Earlie Server, MD   10 mL at 01/09/19 0820  . sodium chloride flush (NS) 0.9 % injection 10 mL  10 mL Intravenous PRN Earlie Server, MD   10 mL at 01/10/19 1400     PHYSICAL EXAMINATION: ECOG PERFORMANCE STATUS: 1 - Symptomatic but completely ambulatory Vitals:   10/14/20 0900  BP: 103/73  Pulse: (!) 101  Resp: 16  Temp: (!) 97.4 F (36.3 C)   Filed Weights   10/14/20 0900  Weight: 139 lb 3.2 oz (63.1 kg)   Physical Exam Constitutional:      General: She is not in acute distress.  Appearance: She is not diaphoretic.     Comments: Patient sits in the wheelchair.  HENT:     Head: Normocephalic and atraumatic.     Nose: Nose normal.     Mouth/Throat:     Pharynx: No oropharyngeal exudate.  Eyes:     General: No scleral icterus.    Pupils: Pupils are equal, round, and reactive to light.  Cardiovascular:     Rate and Rhythm: Normal rate and regular rhythm.     Heart sounds: No murmur heard.   Pulmonary:     Effort: Pulmonary effort is normal. No respiratory distress.     Breath sounds: No rales.  Chest:     Chest wall: No tenderness.  Abdominal:     General: There is no distension.     Palpations: Abdomen is soft.     Tenderness: There is no abdominal tenderness.  Musculoskeletal:        General: Normal range of motion.     Cervical back: Normal range of motion and neck supple.  Skin:    General: Skin is warm and dry.     Findings: No erythema.  Neurological:     Mental Status: She is alert and oriented to person, place, and time.     Cranial Nerves: No cranial nerve deficit.     Motor: No abnormal muscle tone.     Coordination: Coordination normal.  Psychiatric:        Mood and Affect: Affect normal.        LABORATORY DATA:  I have reviewed the data as listed Lab Results  Component Value Date   WBC 8.2 10/14/2020   HGB 13.1 10/14/2020   HCT 38.5 10/14/2020   MCV 87.7 10/14/2020   PLT 196 10/14/2020   Recent Labs    08/12/20 1315 08/12/20 1315 09/02/20 1358 09/23/20 0845 10/14/20 0839  NA 134*   < > 137 138 134*  K 4.0   < > 3.5 4.0 3.8  CL 104   < > 105 106 102    CO2 23   < > 24 24 22   GLUCOSE 98   < > 103* 91 75  BUN 13   < > 18 14 15   CREATININE 0.81   < > 0.68 0.75 0.93  CALCIUM 8.8*   < > 8.3* 8.6* 9.2  GFRNONAA >60   < > >60 >60 >60  GFRAA >60  --  >60 >60  --   PROT 7.4   < > 6.8 7.0 7.8  ALBUMIN 4.0   < > 3.7 3.8 4.2  AST 17   < > 14* 17 24  ALT 12   < > 11 11 12   ALKPHOS 49   < > 49 47 48  BILITOT 0.5   < > 0.5 0.6 0.7   < > = values in this interval not displayed.    RADIOGRAPHIC STUDIES: I have personally reviewed the radiological images as listed and agreed with the findings in the report.  MR Brain W Wo Contrast  Result Date: 08/15/2020 CLINICAL DATA:  74 year old female with small cell lung cancer status post prophylactic cranial radiation in May and June 2020. Restaging. EXAM: MRI HEAD WITHOUT AND WITH CONTRAST TECHNIQUE: Multiplanar, multiecho pulse sequences of the brain and surrounding structures were obtained without and with intravenous contrast. CONTRAST:  88mL GADAVIST GADOBUTROL 1 MMOL/ML IV SOLN COMPARISON:  Brain MRI 04/12/2020 and earlier. FINDINGS: Brain: No abnormal enhancement identified. No midline shift, mass effect, or  evidence of intracranial mass lesion. No dural thickening. Cerebral volume appears stable. No restricted diffusion to suggest acute infarction. No ventriculomegaly, extra-axial collection or acute intracranial hemorrhage. Cervicomedullary junction and pituitary are within normal limits. Confluent bilateral cerebral white matter T2 and FLAIR hyperintensity appears stable and compatible with sequelae of whole brain radiation. Less pronounced T2 heterogeneity in the bilateral deep gray nuclei and brainstem is stable. One or 2 punctate areas of gliosis in the left cerebellar hemisphere appears stable. No cortical encephalomalacia or chronic cerebral blood products identified. Vascular: Major intracranial vascular flow voids are stable. The major dural venous sinuses are enhancing and appear to be patent. Skull  and upper cervical spine: Negative visible cervical spine and spinal cord. Normal visible bone marrow signal. Sinuses/Orbits: Stable and negative. Other: Mastoids remain well pneumatized. Visible internal auditory structures appear normal. Scalp and face appear negative. IMPRESSION: Stable appearance status post whole brain radiation. No metastatic disease or acute intracranial abnormality. Electronically Signed   By: Genevie Ann M.D.   On: 08/15/2020 07:13   CT CHEST ABDOMEN PELVIS W CONTRAST  Result Date: 07/26/2020 CLINICAL DATA:  Follow-up small cell lung cancer EXAM: CT CHEST, ABDOMEN, AND PELVIS WITH CONTRAST TECHNIQUE: Multidetector CT imaging of the chest, abdomen and pelvis was performed following the standard protocol during bolus administration of intravenous contrast. CONTRAST:  133mL OMNIPAQUE IOHEXOL 300 MG/ML  SOLN COMPARISON:  04/29/2020 FINDINGS: CT CHEST FINDINGS Cardiovascular: The heart is normal in size. No pericardial effusion. No evidence of thoracic aortic aneurysm. Atherosclerotic calcifications of the aortic arch. Three vessel coronary atherosclerosis. Right chest port terminates at the cavoatrial junction. Mediastinum/Nodes: No discrete mediastinal lymphadenopathy. Very mild soft tissue thickening in the subcarinal region (series 2/image 27) and along the left lateral aspect of the mid esophagus (series 2/image 23), although similar to priors, and therefore not considered suspicious for true nodal tissue on the current study. No suspicious hilar or axillary lymphadenopathy. Visualized thyroid is unremarkable. Lungs/Pleura: Moderate centrilobular and paraseptal emphysematous changes, upper lung predominant. Mild subpleural patchy opacities in the posterior left lower lobe (series 4/image 49), unchanged, favoring atelectasis/scarring. New mildly irregular subpleural opacity in the lateral right lower lobe, including a 12 mm irregular nodular component anteriorly (series 4/image 88),  nonspecific/indeterminate but warranting attention on follow-up. Otherwise, no suspicious pulmonary nodules. Stable calcified granulomata in the right upper lobe and right middle lobe, benign. No pleural effusion or pneumothorax. Musculoskeletal: Interval vertebral augmentation at T7. Mild to moderate inferior endplate compression fracture deformity at T4 (sagittal image 108), unchanged. Mild inferior endplate changes at T8, unchanged. Mild degenerative changes of the thoracic spine. Old upper sternal fracture (sagittal image 100). CT ABDOMEN PELVIS FINDINGS Hepatobiliary: Liver is within normal limits. No suspicious/enhancing hepatic lesions. Status post cholecystectomy. No intrahepatic or extrahepatic ductal dilatation. Pancreas: Within normal limits, noting mild parenchymal atrophy. Spleen: Calcified granuloma anteriorly.  Otherwise unremarkable. Adrenals/Urinary Tract: Adrenal glands are within normal limits. Kidneys are within normal limits.  No hydronephrosis. Low-lying bladder with mild anterior wall thickening (series 2/image 101). Stomach/Bowel: Stomach is within normal limits. No evidence of bowel obstruction. Appendix is not discretely visualized. Two right paramidline ventral hernias which both contain loops of nondilated small bowel on the current study (series 7/images 27 and 32). Mild wall thickening involving the ascending colon (series 2/image 69), but without convincing inflammatory changes. Left colon is unremarkable. Vascular/Lymphatic: No evidence of abdominal aortic aneurysm. Atherosclerotic calcifications of the abdominal aorta and branch vessels. No suspicious abdominopelvic lymphadenopathy. Reproductive: Uterus is within normal  limits. Bilateral ovaries are unremarkable. Other: No abdominopelvic ascites. Musculoskeletal: Visualized osseous structures are within normal limits. IMPRESSION: Mildly irregular subpleural opacity in the lateral right lower lobe with a 12 mm nodular component. This  appearance is nonspecific and certainly not typical for neoplasm, but warrants attention on follow-up. No findings specific for recurrent or metastatic disease. No suspicious lymphadenopathy in the chest, abdomen, or pelvis. Mild colonic wall thickening involving the ascending colon, but without convincing inflammatory changes, nonspecific. Correlate for infectious/inflammatory colitis. Two right paramidline ventral hernias, both of which now contain loops of nondilated small bowel, without evidence of obstruction. Additional stable ancillary findings as above. Electronically Signed   By: Julian Hy M.D.   On: 07/26/2020 07:57     ASSESSMENT & PLAN:  1. Small cell lung cancer ([place])   2. Encounter for antineoplastic immunotherapy   3. Osteopenia, unspecified location    #Extensive small cell lung cancer, S/p 4 cycles of Carboplatin, Etoposide and Tecentriq.   Status post chest and whole brain radiation.  Currently on Tecentriq maintenance. Labs are reviewed and discussed with patient. Counts are stable.  Proceed with Tecentriq treatment today.  MRI brain and CT scheduled for last time in August.  We will repeat CT scan in November 2021  # Hypotension, antihypertensive medications. Normal cortisol level. BP is better.  Continue monitor. Encourage oral hydration.   #Osteopenia continue calcium and vitamin D supplementation.  On Zometa every 4 to 6 weeks, in the context of extensive small cell lung cancer.. Plan Zometa at next week.   Follow up in 3 weeks for eval with next cycle of tecentriq    Earlie Server, MD, PhD

## 2020-10-17 ENCOUNTER — Other Ambulatory Visit: Payer: Self-pay | Admitting: *Deleted

## 2020-10-17 MED ORDER — OMEPRAZOLE 40 MG PO CPDR
40.0000 mg | DELAYED_RELEASE_CAPSULE | Freq: Every day | ORAL | 1 refills | Status: DC
Start: 2020-10-17 — End: 2021-03-31

## 2020-11-04 ENCOUNTER — Inpatient Hospital Stay: Payer: Medicare Other | Attending: Oncology

## 2020-11-04 ENCOUNTER — Encounter: Payer: Self-pay | Admitting: Oncology

## 2020-11-04 ENCOUNTER — Inpatient Hospital Stay (HOSPITAL_BASED_OUTPATIENT_CLINIC_OR_DEPARTMENT_OTHER): Payer: Medicare Other | Admitting: Oncology

## 2020-11-04 ENCOUNTER — Inpatient Hospital Stay: Payer: Medicare Other

## 2020-11-04 VITALS — BP 106/72 | HR 76 | Temp 97.3°F | Resp 18 | Wt 143.9 lb

## 2020-11-04 DIAGNOSIS — M8580 Other specified disorders of bone density and structure, unspecified site: Secondary | ICD-10-CM | POA: Diagnosis not present

## 2020-11-04 DIAGNOSIS — C3490 Malignant neoplasm of unspecified part of unspecified bronchus or lung: Secondary | ICD-10-CM

## 2020-11-04 DIAGNOSIS — Z5112 Encounter for antineoplastic immunotherapy: Secondary | ICD-10-CM

## 2020-11-04 DIAGNOSIS — Z87891 Personal history of nicotine dependence: Secondary | ICD-10-CM | POA: Diagnosis not present

## 2020-11-04 DIAGNOSIS — Z79899 Other long term (current) drug therapy: Secondary | ICD-10-CM | POA: Diagnosis not present

## 2020-11-04 DIAGNOSIS — R413 Other amnesia: Secondary | ICD-10-CM

## 2020-11-04 DIAGNOSIS — E860 Dehydration: Secondary | ICD-10-CM

## 2020-11-04 DIAGNOSIS — R5383 Other fatigue: Secondary | ICD-10-CM

## 2020-11-04 LAB — COMPREHENSIVE METABOLIC PANEL
ALT: 10 U/L (ref 0–44)
AST: 16 U/L (ref 15–41)
Albumin: 3.8 g/dL (ref 3.5–5.0)
Alkaline Phosphatase: 39 U/L (ref 38–126)
Anion gap: 8 (ref 5–15)
BUN: 14 mg/dL (ref 8–23)
CO2: 24 mmol/L (ref 22–32)
Calcium: 8.9 mg/dL (ref 8.9–10.3)
Chloride: 103 mmol/L (ref 98–111)
Creatinine, Ser: 0.84 mg/dL (ref 0.44–1.00)
GFR, Estimated: >60 mL/min (ref >60)
Glucose, Bld: 101 mg/dL — ABNORMAL HIGH (ref 70–99)
Potassium: 3.9 mmol/L (ref 3.5–5.1)
Sodium: 135 mmol/L (ref 135–145)
Total Bilirubin: 0.5 mg/dL (ref 0.3–1.2)
Total Protein: 7.1 g/dL (ref 6.5–8.1)

## 2020-11-04 LAB — CBC WITH DIFFERENTIAL/PLATELET
Abs Immature Granulocytes: 0.04 10*3/uL (ref 0.00–0.07)
Basophils Absolute: 0.1 10*3/uL (ref 0.0–0.1)
Basophils Relative: 1 %
Eosinophils Absolute: 0.3 10*3/uL (ref 0.0–0.5)
Eosinophils Relative: 4 %
HCT: 33.4 % — ABNORMAL LOW (ref 36.0–46.0)
Hemoglobin: 11.5 g/dL — ABNORMAL LOW (ref 12.0–15.0)
Immature Granulocytes: 1 %
Lymphocytes Relative: 24 %
Lymphs Abs: 1.5 10*3/uL (ref 0.7–4.0)
MCH: 30.6 pg (ref 26.0–34.0)
MCHC: 34.4 g/dL (ref 30.0–36.0)
MCV: 88.8 fL (ref 80.0–100.0)
Monocytes Absolute: 0.8 10*3/uL (ref 0.1–1.0)
Monocytes Relative: 13 %
Neutro Abs: 3.6 10*3/uL (ref 1.7–7.7)
Neutrophils Relative %: 57 %
Platelets: 178 10*3/uL (ref 150–400)
RBC: 3.76 MIL/uL — ABNORMAL LOW (ref 3.87–5.11)
RDW: 13.8 % (ref 11.5–15.5)
WBC: 6.3 10*3/uL (ref 4.0–10.5)
nRBC: 0 % (ref 0.0–0.2)

## 2020-11-04 LAB — T4, FREE: Free T4: 0.97 ng/dL (ref 0.61–1.12)

## 2020-11-04 LAB — TSH: TSH: 1.172 u[IU]/mL (ref 0.350–4.500)

## 2020-11-04 MED ORDER — ZOLEDRONIC ACID 4 MG/100ML IV SOLN
4.0000 mg | Freq: Once | INTRAVENOUS | Status: AC
  Administered 2020-11-04: 4 mg via INTRAVENOUS
  Filled 2020-11-04: qty 100

## 2020-11-04 MED ORDER — SODIUM CHLORIDE 0.9 % IV SOLN
1200.0000 mg | Freq: Once | INTRAVENOUS | Status: AC
  Administered 2020-11-04: 1200 mg via INTRAVENOUS
  Filled 2020-11-04: qty 20

## 2020-11-04 MED ORDER — HEPARIN SOD (PORK) LOCK FLUSH 100 UNIT/ML IV SOLN
INTRAVENOUS | Status: AC
  Filled 2020-11-04: qty 5

## 2020-11-04 MED ORDER — DEXAMETHASONE SODIUM PHOSPHATE 10 MG/ML IJ SOLN
10.0000 mg | Freq: Once | INTRAMUSCULAR | Status: AC
  Administered 2020-11-04: 10 mg via INTRAVENOUS
  Filled 2020-11-04: qty 1

## 2020-11-04 MED ORDER — SODIUM CHLORIDE 0.9 % IV SOLN
Freq: Once | INTRAVENOUS | Status: AC
  Filled 2020-11-04: qty 250

## 2020-11-04 MED ORDER — HEPARIN SOD (PORK) LOCK FLUSH 100 UNIT/ML IV SOLN
500.0000 [IU] | Freq: Once | INTRAVENOUS | Status: AC | PRN
  Administered 2020-11-04: 500 [IU]
  Filled 2020-11-04: qty 5

## 2020-11-04 NOTE — Progress Notes (Signed)
Patient has an occasional headache for the past 4-5 days.

## 2020-11-04 NOTE — Progress Notes (Signed)
Per Benjamine Mola RN per Dr. Tasia Catchings okay to proceed with Tecentriq and Zometa treatment as scheduled.    1035: Pt tolerated treatment well. No s/s of distress or reaction noted. Pt stable at discharge.

## 2020-11-04 NOTE — Progress Notes (Signed)
Hematology/Oncology Follow up note [facility] [phone] [phone]   Patient Care Team: Glean Hess, MD as PCP - General (Internal Medicine) Charolette Forward, MD as Consulting Physician (Cardiology) Telford Nab, RN as Registered Nurse [name], MD as Radiation Oncologist (Radiation Oncology)  REFERRING PROVIDER: Dr. Felicie Morn REASON FOR VISIT:  Follow-up for small cell lung cancer,   HISTORY OF PRESENTING ILLNESS:  Kristi [name] is a  74 y.o.  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  10/20/2018 CT chest with contrast showed large mediastinal mass involving both hilar, left greater than right, consistent with lung carcinoma, The mass causes narrowing of the left mainstem bronchus with resultant volume loss on the left and a mediastinal shift to the left.  Moderate size left pleural effusion. 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  # Initial MRI brain negative.  # Nov 2019- Jan 2020 s/p 4 cycles of Carbo/Etoposide/Tecentriq #  01/24/2019 interim CT scan done which showed continued positive response to therapy with continued reduction in mediastinal adenopathy.  No residual measurable left lung mass. CT findings of acute emphysematous cystitis. Urology did not feel that patient has pyelonephritis and recommend treatment with antibiotics because patient's immunocompromise. Patient finished treatment.   # 11/02/2018-01/09/2019 Chemotherapy carboplatin + Etoposide + tecentriq # Consolidation chest radiation and whole brain radiation finished in May 2020 # 04/25/2019 resume Tecentriq every 3 weeks. # Patient had a fall from her stairs on 01/19/2020. In the emergency room she had CT chest abdomen pelvis CT, CT head without contrast, CT cervical spine without contrast. No CT evidence for acute  intracranial abnormality, degenerative changes of cervical spine..  No CT evidence of acute thoracic abdomen injury.  Severe compression fracture of T7, subacute.  # kyphoplasty procedure on 03/01/2020.  T7 biopsy negative for cancer.  INTERVAL HISTORY Kristi [name] is a 74 y.o. female who has above history reviewed by me today presents for evaluation prior to immunotherapy for treatment of extensive small cell lung cancer. Patient is on immunotherapy maintenance. She is doing well over all.  Patient reports occasional headache for which she takes Tylenol with good symptom relief. Hand cramps occasionally.  Otherwise no new complaints  Appetite is very good She has gained a few pounds since last visit. ..  Review of Systems  Constitutional: Positive for fatigue. Negative for appetite change, chills and fever.  HENT:   Negative for hearing loss and voice change.   Eyes: Negative for eye problems.  Respiratory: Negative for chest tightness, cough and shortness of breath.   Cardiovascular: Negative for chest pain.  Gastrointestinal: Negative for abdominal distention, abdominal pain, blood in stool, diarrhea and nausea.  Endocrine: Negative for hot flashes.  Genitourinary: Negative for difficulty urinating and frequency.   Musculoskeletal: Negative for arthralgias and back pain.  Skin: Negative for itching and rash.  Neurological: Positive for headaches. Negative for extremity weakness.  Hematological: Negative for adenopathy.  Psychiatric/Behavioral: Negative for confusion and depression. The patient is not nervous/anxious.        Forgetful     MEDICAL HISTORY:  Past Medical History:  Diagnosis Date  . Acute MI, inferoposterior wall ([place]) 09/30/2014  . Claustrophobia   . Coronary artery disease   . GERD (gastroesophageal reflux disease)   . Hypercholesteremia   . Hypertension   . MI, old   . Pneumonia   . Small  cell lung cancer in adult [facility]) 10/27/2018    SURGICAL  HISTORY: Past Surgical History:  Procedure Laterality Date  . APPENDECTOMY     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  . BLADDER NECK SUSPENSION    . CHOLECYSTECTOMY N/A 08/14/2018   Procedure: LAPAROSCOPIC CHOLECYSTECTOMY;  Surgeon: Jules Husbands, MD;  Location: [facility] ORS;  Service: General;  Laterality: N/A;  . CHOLECYSTECTOMY  11/2018  . CORONARY ANGIOPLASTY  09/29/2014  . CORONARY STENT PLACEMENT    . ENDOBRONCHIAL ULTRASOUND N/A 10/21/2018   Procedure: ENDOBRONCHIAL ULTRASOUND;  Surgeon: Laverle Hobby, MD;  Location: [facility] ORS;  Service: Pulmonary;  Laterality: N/A;  . HERNIA REPAIR    . IR FLUORO GUIDE CV LINE RIGHT  12/19/2018  . KYPHOPLASTY N/A 03/01/2020   Procedure: T7 KYPHOPLASTY;  Surgeon: Hessie Knows, MD;  Location: [facility] ORS;  Service: Orthopedics;  Laterality: N/A;  . LEFT HEART CATHETERIZATION WITH CORONARY ANGIOGRAM N/A 09/29/2014   Procedure: LEFT HEART CATHETERIZATION WITH CORONARY ANGIOGRAM;  Surgeon: Clent Demark, MD;  Location: [place] CATH LAB;  Service: Cardiovascular;  Laterality: N/A;  . PORTA CATH INSERTION N/A 01/02/2019   Procedure: PORTA CATH INSERTION;  Surgeon: Algernon Huxley, MD;  Location: [place] CV LAB;  Service: Cardiovascular;  Laterality: N/A;  . PORTACATH PLACEMENT Right 10/28/2018   Procedure: INSERTION PORT-A-CATH;  Surgeon: Jules Husbands, MD;  Location: [facility] ORS;  Service: General;  Laterality: Right;    SOCIAL HISTORY: Social History   Socioeconomic History  . Marital status: Married    Spouse name: [name]   . Number of children: 3  . Years of education: Not on file  . Highest education level: Not on file  Occupational History  . Occupation: Retired    Comment: Chief Technology Officer   Tobacco Use  . Smoking status: Former Smoker    Packs/day: 1.00    Years: 39.00    Pack years: 39.00    Types: Cigarettes    Start date: 12/21/1978    Quit date: 10/16/2018    Years since quitting: 2.0  . Smokeless tobacco: Never Used  Vaping Use  . Vaping Use:  Never used  Substance and Sexual Activity  . Alcohol use: Yes    Alcohol/week: 0.0 standard drinks    Comment: occassional - approx 1 every 2 weeks   . Drug use: No  . Sexual activity: Yes    Birth control/protection: None  Other Topics Concern  . Not on file  Social History Narrative  . Not on file   Social Determinants of Health   Financial Resource Strain:   . Difficulty of Paying Living Expenses: Not on file  Food Insecurity:   . Worried About Charity fundraiser in the Last Year: Not on file  . Ran Out of Food in the Last Year: Not on file  Transportation Needs:   . Lack of Transportation (Medical): Not on file  . Lack of Transportation (Non-Medical): Not on file  Physical Activity:   . Days of Exercise per Week: Not on file  . Minutes of Exercise per Session: Not on file  Stress:   . Feeling of Stress : Not on file  Social Connections:   . Frequency of Communication with Friends and Family: Not on file  . Frequency of Social Gatherings with Friends and Family: Not on file  . Attends Religious Services: Not on file  . Active Member of Clubs or Organizations: Not on file  . Attends Archivist Meetings: Not on  file  . Marital Status: Not on file  Intimate Partner Violence:   . Fear of Current or Ex-Partner: Not on file  . Emotionally Abused: Not on file  . Physically Abused: Not on file  . Sexually Abused: Not on file    FAMILY HISTORY: Family History  Problem Relation Age of Onset  . Alzheimer's disease Mother   . Colon cancer Father   . Breast cancer Neg Hx     ALLERGIES:  has No Known Allergies.  MEDICATIONS:  Current Outpatient Medications  Medication Sig Dispense Refill  . acetaminophen (TYLENOL) 500 MG tablet Take 500 mg by mouth 2 (two) times daily as needed for moderate pain or headache.    Huey Bienenstock (TECENTRIQ IV) Inject 1 Dose into the vein every 30 (thirty) days.    . Cholecalciferol (DIALYVITE VITAMIN D 5000) 125 MCG (5000 UT)  capsule Take 5,000 Units by mouth daily.    . diphenhydrAMINE-zinc acetate (BENADRYL EXTRA STRENGTH) cream Apply 1 application topically 3 (three) times daily as needed for itching. 28.4 g 0  . HYDROcodone-acetaminophen (NORCO/VICODIN) 5-325 MG tablet Take 1 tablet by mouth every 6 (six) hours as needed for moderate pain. 90 tablet 0  . lidocaine-prilocaine (EMLA) cream Apply 1 application topically daily as needed (port access). 30 g 2  . loperamide (IMODIUM A-D) 2 MG tablet Take 2 mg by mouth 4 (four) times daily as needed for diarrhea or loose stools.     . megestrol (MEGACE) 400 MG/10ML suspension Take 10 mLs (400 mg total) by mouth 2 (two) times daily. (Patient not taking: Reported on 09/02/2020) 240 mL 1  . nitroGLYCERIN (NITROSTAT) 0.4 MG SL tablet Place 1 tablet (0.4 mg total) under the tongue every 5 (five) minutes x 3 doses as needed for chest pain. (Patient not taking: Reported on 06/25/2020) 25 tablet 12  . omeprazole (PRILOSEC) 40 MG capsule Take 1 capsule (40 mg total) by mouth daily. 90 capsule 1  . polyethylene glycol (MIRALAX / GLYCOLAX) packet Take 17 g by mouth daily as needed for mild constipation.  (Patient not taking: Reported on 09/23/2020)    . senna (SENOKOT) 8.6 MG TABS tablet Take 2 tablets (17.2 mg total) by mouth daily. 120 tablet 0  . sertraline (ZOLOFT) 50 MG tablet Take 1.5 tablets (75 mg total) by mouth daily. 45 tablet 6  . vitamin B-12 (CYANOCOBALAMIN) 1000 MCG tablet Take 1,000 mcg by mouth daily.    . Zoledronic Acid (ZOMETA IV) Inject 1 Dose into the vein every 30 (thirty) days.     No current facility-administered medications for this visit.   Facility-Administered Medications Ordered in Other Visits  Medication Dose Route Frequency Provider Last Rate Last Admin  . heparin lock flush 100 unit/mL  500 Units Intravenous Once Earlie Server, MD      . heparin lock flush 100 unit/mL  500 Units Intravenous Once Earlie Server, MD      . sodium chloride flush (NS) 0.9 % injection  10 mL  10 mL Intravenous PRN Earlie Server, MD   10 mL at 01/09/19 0820  . sodium chloride flush (NS) 0.9 % injection 10 mL  10 mL Intravenous PRN Earlie Server, MD   10 mL at 01/10/19 1400     PHYSICAL EXAMINATION: ECOG PERFORMANCE STATUS: 1 - Symptomatic but completely ambulatory Vitals:   11/04/20 0836  BP: 106/72  Pulse: 76  Resp: 18  Temp: (!) 97.3 F (36.3 C)   Filed Weights   11/04/20 0836  Weight:  143 lb 14.4 oz (65.3 kg)   Physical Exam Constitutional:      General: She is not in acute distress.    Appearance: She is not diaphoretic.     Comments: Patient sits in the wheelchair.  HENT:     Head: Normocephalic and atraumatic.     Nose: Nose normal.     Mouth/Throat:     Pharynx: No oropharyngeal exudate.  Eyes:     General: No scleral icterus.    Pupils: Pupils are equal, round, and reactive to light.  Cardiovascular:     Rate and Rhythm: Normal rate and regular rhythm.     Heart sounds: No murmur heard.   Pulmonary:     Effort: Pulmonary effort is normal. No respiratory distress.     Breath sounds: No rales.  Chest:     Chest wall: No tenderness.  Abdominal:     General: There is no distension.     Palpations: Abdomen is soft.     Tenderness: There is no abdominal tenderness.  Musculoskeletal:        General: Normal range of motion.     Cervical back: Normal range of motion and neck supple.  Skin:    General: Skin is warm and dry.     Findings: No erythema.  Neurological:     Mental Status: She is alert and oriented to person, place, and time.     Cranial Nerves: No cranial nerve deficit.     Motor: No abnormal muscle tone.     Coordination: Coordination normal.  Psychiatric:        Mood and Affect: Affect normal.        LABORATORY DATA:  I have reviewed the data as listed Lab Results  Component Value Date   WBC 8.2 10/14/2020   HGB 13.1 10/14/2020   HCT 38.5 10/14/2020   MCV 87.7 10/14/2020   PLT 196 10/14/2020   Recent Labs    08/12/20 1315  08/12/20 1315 09/02/20 1358 09/23/20 0845 10/14/20 0839  NA 134*   < > 137 138 134*  K 4.0   < > 3.5 4.0 3.8  CL 104   < > 105 106 102  CO2 23   < > 24 24 22   GLUCOSE 98   < > 103* 91 75  BUN 13   < > 18 14 15   CREATININE 0.81   < > 0.68 0.75 0.93  CALCIUM 8.8*   < > 8.3* 8.6* 9.2  GFRNONAA >60   < > >60 >60 >60  GFRAA >60  --  >60 >60  --   PROT 7.4   < > 6.8 7.0 7.8  ALBUMIN 4.0   < > 3.7 3.8 4.2  AST 17   < > 14* 17 24  ALT 12   < > 11 11 12   ALKPHOS 49   < > 49 47 48  BILITOT 0.5   < > 0.5 0.6 0.7   < > = values in this interval not displayed.    RADIOGRAPHIC STUDIES: I have personally reviewed the radiological images as listed and agreed with the findings in the report.  MR Brain W Wo Contrast  Result Date: 08/15/2020 CLINICAL DATA:  74 year old female with small cell lung cancer status post prophylactic cranial radiation in May and June 2020. Restaging. EXAM: MRI HEAD WITHOUT AND WITH CONTRAST TECHNIQUE: Multiplanar, multiecho pulse sequences of the brain and surrounding structures were obtained without and with intravenous contrast. CONTRAST:  65mL  GADAVIST GADOBUTROL 1 MMOL/ML IV SOLN COMPARISON:  Brain MRI 04/12/2020 and earlier. FINDINGS: Brain: No abnormal enhancement identified. No midline shift, mass effect, or evidence of intracranial mass lesion. No dural thickening. Cerebral volume appears stable. No restricted diffusion to suggest acute infarction. No ventriculomegaly, extra-axial collection or acute intracranial hemorrhage. Cervicomedullary junction and pituitary are within normal limits. Confluent bilateral cerebral white matter T2 and FLAIR hyperintensity appears stable and compatible with sequelae of whole brain radiation. Less pronounced T2 heterogeneity in the bilateral deep gray nuclei and brainstem is stable. One or 2 punctate areas of gliosis in the left cerebellar hemisphere appears stable. No cortical encephalomalacia or chronic cerebral blood products  identified. Vascular: Major intracranial vascular flow voids are stable. The major dural venous sinuses are enhancing and appear to be patent. Skull and upper cervical spine: Negative visible cervical spine and spinal cord. Normal visible bone marrow signal. Sinuses/Orbits: Stable and negative. Other: Mastoids remain well pneumatized. Visible internal auditory structures appear normal. Scalp and face appear negative. IMPRESSION: Stable appearance status post whole brain radiation. No metastatic disease or acute intracranial abnormality. Electronically Signed   By: Genevie Ann M.D.   On: 08/15/2020 07:13     ASSESSMENT & PLAN:  1. Small cell lung cancer ([place])   2. Encounter for antineoplastic immunotherapy   3. Osteopenia, unspecified location    #Extensive small cell lung cancer, S/p 4 cycles of Carboplatin, Etoposide and Tecentriq.   Status post chest and whole brain radiation.  Currently on Tecentriq maintenance. Labs are reviewed and discussed with patient. Counts are stable to proceed with Tecentriq treatment. I will obtain surveillance CT scan in November 2021  #Osteopenia continue calcium and vitamin D supplement.  On Zometa every 4 to 6 weeks, in the setting of extensive small cell lung cancer.. Proceed with Zometa today.  Follow up in 3 weeks for eval with next cycle of tecentriq    Earlie Server, MD, PhD

## 2020-11-20 ENCOUNTER — Ambulatory Visit
Admission: RE | Admit: 2020-11-20 | Discharge: 2020-11-20 | Disposition: A | Discharge status: Home / Self Care | Payer: Medicare Other | Source: Ambulatory Visit | Attending: Oncology | Admitting: Oncology

## 2020-11-20 ENCOUNTER — Other Ambulatory Visit: Payer: Self-pay

## 2020-11-20 DIAGNOSIS — K429 Umbilical hernia without obstruction or gangrene: Secondary | ICD-10-CM | POA: Diagnosis not present

## 2020-11-20 DIAGNOSIS — C3490 Malignant neoplasm of unspecified part of unspecified bronchus or lung: Secondary | ICD-10-CM

## 2020-11-20 DIAGNOSIS — I2510 Atherosclerotic heart disease of native coronary artery without angina pectoris: Secondary | ICD-10-CM | POA: Diagnosis not present

## 2020-11-20 DIAGNOSIS — R9082 White matter disease, unspecified: Secondary | ICD-10-CM | POA: Diagnosis not present

## 2020-11-20 DIAGNOSIS — M4316 Spondylolisthesis, lumbar region: Secondary | ICD-10-CM | POA: Diagnosis not present

## 2020-11-20 DIAGNOSIS — S2222XA Fracture of body of sternum, initial encounter for closed fracture: Secondary | ICD-10-CM | POA: Diagnosis not present

## 2020-11-20 DIAGNOSIS — S22060A Wedge compression fracture of T7-T8 vertebra, initial encounter for closed fracture: Secondary | ICD-10-CM | POA: Diagnosis not present

## 2020-11-20 DIAGNOSIS — Z85118 Personal history of other malignant neoplasm of bronchus and lung: Secondary | ICD-10-CM | POA: Diagnosis not present

## 2020-11-20 MED ORDER — GADOBUTROL 1 MMOL/ML IV SOLN
6.0000 mL | Freq: Once | INTRAVENOUS | Status: AC | PRN
  Administered 2020-11-20: 6 mL via INTRAVENOUS

## 2020-11-20 MED ORDER — IOHEXOL 300 MG/ML  SOLN
100.0000 mL | Freq: Once | INTRAMUSCULAR | Status: AC | PRN
  Administered 2020-11-20: 100 mL via INTRAVENOUS

## 2020-11-25 ENCOUNTER — Other Ambulatory Visit: Payer: Self-pay

## 2020-11-25 ENCOUNTER — Inpatient Hospital Stay: Payer: Medicare Other

## 2020-11-25 ENCOUNTER — Inpatient Hospital Stay (HOSPITAL_BASED_OUTPATIENT_CLINIC_OR_DEPARTMENT_OTHER): Payer: Medicare Other | Admitting: Oncology

## 2020-11-25 ENCOUNTER — Encounter: Payer: Self-pay | Admitting: Oncology

## 2020-11-25 ENCOUNTER — Inpatient Hospital Stay: Payer: Medicare Other | Attending: Oncology

## 2020-11-25 VITALS — BP 112/78 | HR 79 | Temp 98.2°F | Resp 18 | Wt 143.8 lb

## 2020-11-25 DIAGNOSIS — Z5112 Encounter for antineoplastic immunotherapy: Secondary | ICD-10-CM | POA: Diagnosis not present

## 2020-11-25 DIAGNOSIS — C3490 Malignant neoplasm of unspecified part of unspecified bronchus or lung: Secondary | ICD-10-CM | POA: Insufficient documentation

## 2020-11-25 DIAGNOSIS — Z87891 Personal history of nicotine dependence: Secondary | ICD-10-CM | POA: Diagnosis not present

## 2020-11-25 DIAGNOSIS — C7951 Secondary malignant neoplasm of bone: Secondary | ICD-10-CM | POA: Insufficient documentation

## 2020-11-25 DIAGNOSIS — Z993 Dependence on wheelchair: Secondary | ICD-10-CM | POA: Insufficient documentation

## 2020-11-25 DIAGNOSIS — M8580 Other specified disorders of bone density and structure, unspecified site: Secondary | ICD-10-CM | POA: Insufficient documentation

## 2020-11-25 DIAGNOSIS — R197 Diarrhea, unspecified: Secondary | ICD-10-CM | POA: Insufficient documentation

## 2020-11-25 LAB — COMPREHENSIVE METABOLIC PANEL
ALT: 12 U/L (ref 0–44)
AST: 22 U/L (ref 15–41)
Albumin: 3.9 g/dL (ref 3.5–5.0)
Alkaline Phosphatase: 47 U/L (ref 38–126)
Anion gap: 10 (ref 5–15)
BUN: 13 mg/dL (ref 8–23)
CO2: 25 mmol/L (ref 22–32)
Calcium: 9.2 mg/dL (ref 8.9–10.3)
Chloride: 99 mmol/L (ref 98–111)
Creatinine, Ser: 0.96 mg/dL (ref 0.44–1.00)
GFR, Estimated: >60 mL/min (ref >60)
Glucose, Bld: 105 mg/dL — ABNORMAL HIGH (ref 70–99)
Potassium: 3.9 mmol/L (ref 3.5–5.1)
Sodium: 134 mmol/L — ABNORMAL LOW (ref 135–145)
Total Bilirubin: 0.6 mg/dL (ref 0.3–1.2)
Total Protein: 7 g/dL (ref 6.5–8.1)

## 2020-11-25 LAB — CBC WITH DIFFERENTIAL/PLATELET
Abs Immature Granulocytes: 0.06 10*3/uL (ref 0.00–0.07)
Basophils Absolute: 0.1 10*3/uL (ref 0.0–0.1)
Basophils Relative: 1 %
Eosinophils Absolute: 0.3 10*3/uL (ref 0.0–0.5)
Eosinophils Relative: 5 %
HCT: 35.2 % — ABNORMAL LOW (ref 36.0–46.0)
Hemoglobin: 12 g/dL (ref 12.0–15.0)
Immature Granulocytes: 1 %
Lymphocytes Relative: 23 %
Lymphs Abs: 1.3 10*3/uL (ref 0.7–4.0)
MCH: 30.9 pg (ref 26.0–34.0)
MCHC: 34.1 g/dL (ref 30.0–36.0)
MCV: 90.7 fL (ref 80.0–100.0)
Monocytes Absolute: 0.8 10*3/uL (ref 0.1–1.0)
Monocytes Relative: 13 %
Neutro Abs: 3.3 10*3/uL (ref 1.7–7.7)
Neutrophils Relative %: 57 %
Platelets: 188 10*3/uL (ref 150–400)
RBC: 3.88 MIL/uL (ref 3.87–5.11)
RDW: 13.7 % (ref 11.5–15.5)
WBC: 5.8 10*3/uL (ref 4.0–10.5)
nRBC: 0 % (ref 0.0–0.2)

## 2020-11-25 MED ORDER — SODIUM CHLORIDE 0.9% FLUSH
10.0000 mL | Freq: Once | INTRAVENOUS | Status: AC
  Administered 2020-11-25: 10 mL via INTRAVENOUS
  Filled 2020-11-25: qty 10

## 2020-11-25 MED ORDER — HEPARIN SOD (PORK) LOCK FLUSH 100 UNIT/ML IV SOLN
500.0000 [IU] | Freq: Once | INTRAVENOUS | Status: AC
  Administered 2020-11-25: 500 [IU] via INTRAVENOUS
  Filled 2020-11-25: qty 5

## 2020-11-25 MED ORDER — HEPARIN SOD (PORK) LOCK FLUSH 100 UNIT/ML IV SOLN
500.0000 [IU] | Freq: Once | INTRAVENOUS | Status: DC
  Filled 2020-11-25: qty 5

## 2020-11-25 NOTE — Progress Notes (Signed)
Hematology/Oncology Follow up note [facility] Telephone:([phone] [phone]) [phone]   Patient Care Team: Glean Hess, MD as PCP - General (Internal Medicine) Charolette Forward, MD as Consulting Physician (Cardiology) Telford Nab, RN as Registered Nurse [name], MD as Radiation Oncologist (Radiation Oncology)  REFERRING PROVIDER: Dr. Felicie Morn REASON FOR VISIT:  Follow-up for small cell lung cancer,   HISTORY OF PRESENTING ILLNESS:  Kristi [name] is a  74 y.o.  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  10/20/2018 CT chest with contrast showed large mediastinal mass involving both hilar, left greater than right, consistent with lung carcinoma, The mass causes narrowing of the left mainstem bronchus with resultant volume loss on the left and a mediastinal shift to the left.  Moderate size left pleural effusion. 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  # Initial MRI brain negative.  # Nov 2019- Jan 2020 s/p 4 cycles of Carbo/Etoposide/Tecentriq #  01/24/2019 interim CT scan done which showed continued positive response to therapy with continued reduction in mediastinal adenopathy.  No residual measurable left lung mass. CT findings of acute emphysematous cystitis. Urology did not feel that patient has pyelonephritis and recommend treatment with antibiotics because patient's immunocompromise. Patient finished treatment.   # 11/02/2018-01/09/2019 Chemotherapy carboplatin + Etoposide + tecentriq # Consolidation chest radiation and whole brain radiation finished in May 2020 # 04/25/2019 resume Tecentriq every 3 weeks. # Patient had a fall from her stairs on 01/19/2020. In the emergency room she had CT chest abdomen pelvis CT, CT head without contrast, CT cervical spine without contrast. No CT evidence for acute  intracranial abnormality, degenerative changes of cervical spine..  No CT evidence of acute thoracic abdomen injury.  Severe compression fracture of T7, subacute.  # kyphoplasty procedure on 03/01/2020.  T7 biopsy negative for cancer.  INTERVAL HISTORY Kristi [name] is a 74 y.o. female who has above history reviewed by me today presents for evaluation prior to immunotherapy for treatment of extensive small cell lung cancer. Patient is on immunotherapy maintenance. Had surveillance CT done She reports having diarrhea 2-3 times per day after recent CT scan.  No fever, chills, abdominal pain, nausea, vomiting.   ..  Review of Systems  Constitutional: Positive for fatigue. Negative for appetite change, chills and fever.  HENT:   Negative for hearing loss and voice change.   Eyes: Negative for eye problems.  Respiratory: Negative for chest tightness, cough and shortness of breath.   Cardiovascular: Negative for chest pain.  Gastrointestinal: Positive for diarrhea. Negative for abdominal distention, abdominal pain, blood in stool and nausea.  Endocrine: Negative for hot flashes.  Genitourinary: Negative for difficulty urinating and frequency.   Musculoskeletal: Negative for arthralgias and back pain.  Skin: Negative for itching and rash.  Neurological: Positive for headaches. Negative for extremity weakness.  Hematological: Negative for adenopathy.  Psychiatric/Behavioral: Negative for confusion and depression. The patient is not nervous/anxious.        Forgetful     MEDICAL HISTORY:  Past Medical History:  Diagnosis Date  . Acute MI, inferoposterior wall ([place]) 09/30/2014  . Claustrophobia   . Coronary artery disease   . GERD (gastroesophageal reflux disease)   . Hypercholesteremia   . Hypertension   . MI, old   . Pneumonia   . Small cell lung cancer ([place])   . 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 [facility])  10/27/2018    SURGICAL HISTORY: Past Surgical History:  Procedure Laterality  Date  . APPENDECTOMY     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  . BLADDER NECK SUSPENSION    . CHOLECYSTECTOMY N/A 08/14/2018   Procedure: LAPAROSCOPIC CHOLECYSTECTOMY;  Surgeon: Jules Husbands, MD;  Location: [facility] ORS;  Service: General;  Laterality: N/A;  . CHOLECYSTECTOMY  11/2018  . CORONARY ANGIOPLASTY  09/29/2014  . CORONARY STENT PLACEMENT    . ENDOBRONCHIAL ULTRASOUND N/A 10/21/2018   Procedure: ENDOBRONCHIAL ULTRASOUND;  Surgeon: Laverle Hobby, MD;  Location: [facility] ORS;  Service: Pulmonary;  Laterality: N/A;  . HERNIA REPAIR    . IR FLUORO GUIDE CV LINE RIGHT  12/19/2018  . KYPHOPLASTY N/A 03/01/2020   Procedure: T7 KYPHOPLASTY;  Surgeon: Hessie Knows, MD;  Location: [facility] ORS;  Service: Orthopedics;  Laterality: N/A;  . LEFT HEART CATHETERIZATION WITH CORONARY ANGIOGRAM N/A 09/29/2014   Procedure: LEFT HEART CATHETERIZATION WITH CORONARY ANGIOGRAM;  Surgeon: Clent Demark, MD;  Location: [place] CATH LAB;  Service: Cardiovascular;  Laterality: N/A;  . PORTA CATH INSERTION N/A 01/02/2019   Procedure: PORTA CATH INSERTION;  Surgeon: Algernon Huxley, MD;  Location: [place] CV LAB;  Service: Cardiovascular;  Laterality: N/A;  . PORTACATH PLACEMENT Right 10/28/2018   Procedure: INSERTION PORT-A-CATH;  Surgeon: Jules Husbands, MD;  Location: [facility] ORS;  Service: General;  Laterality: Right;    SOCIAL HISTORY: Social History   Socioeconomic History  . Marital status: Married    Spouse name: [name]   . Number of children: 3  . Years of education: Not on file  . Highest education level: Not on file  Occupational History  . Occupation: Retired    Comment: Chief Technology Officer   Tobacco Use  . Smoking status: Former Smoker    Packs/day: 1.00    Years: 39.00    Pack years: 39.00    Types: Cigarettes    Start date: 12/21/1978    Quit date: 10/16/2018    Years since quitting: 2.1  . Smokeless tobacco: Never Used  Vaping Use  . Vaping Use: Never used  Substance and Sexual Activity  . Alcohol  use: Yes    Alcohol/week: 0.0 standard drinks    Comment: occassional - approx 1 every 2 weeks   . Drug use: No  . Sexual activity: Yes    Birth control/protection: None  Other Topics Concern  . Not on file  Social History Narrative  . Not on file   Social Determinants of Health   Financial Resource Strain:   . Difficulty of Paying Living Expenses: Not on file  Food Insecurity:   . Worried About Charity fundraiser in the Last Year: Not on file  . Ran Out of Food in the Last Year: Not on file  Transportation Needs:   . Lack of Transportation (Medical): Not on file  . Lack of Transportation (Non-Medical): Not on file  Physical Activity:   . Days of Exercise per Week: Not on file  . Minutes of Exercise per Session: Not on file  Stress:   . Feeling of Stress : Not on file  Social Connections:   . Frequency of Communication with Friends and Family: Not on file  . Frequency of Social Gatherings with Friends and Family: Not on file  . Attends Religious Services: Not on file  . Active Member of Clubs or Organizations: Not on file  . Attends Archivist Meetings: Not on file  . Marital Status: Not  on file  Intimate Partner Violence:   . Fear of Current or Ex-Partner: Not on file  . Emotionally Abused: Not on file  . Physically Abused: Not on file  . Sexually Abused: Not on file    FAMILY HISTORY: Family History  Problem Relation Age of Onset  . Alzheimer's disease Mother   . Colon cancer Father   . Breast cancer Neg Hx     ALLERGIES:  has No Known Allergies.  MEDICATIONS:  Current Outpatient Medications  Medication Sig Dispense Refill  . acetaminophen (TYLENOL) 500 MG tablet Take 500 mg by mouth 2 (two) times daily as needed for moderate pain or headache.    Huey Bienenstock (TECENTRIQ IV) Inject 1 Dose into the vein every 30 (thirty) days.    . Cholecalciferol (DIALYVITE VITAMIN D 5000) 125 MCG (5000 UT) capsule Take 5,000 Units by mouth daily.    .  diphenhydrAMINE-zinc acetate (BENADRYL EXTRA STRENGTH) cream Apply 1 application topically 3 (three) times daily as needed for itching. 28.4 g 0  . HYDROcodone-acetaminophen (NORCO/VICODIN) 5-325 MG tablet Take 1 tablet by mouth every 6 (six) hours as needed for moderate pain. 90 tablet 0  . lidocaine-prilocaine (EMLA) cream Apply 1 application topically daily as needed (port access). 30 g 2  . loperamide (IMODIUM A-D) 2 MG tablet Take 2 mg by mouth 4 (four) times daily as needed for diarrhea or loose stools.     [name] Kitchen omeprazole (PRILOSEC) 40 MG capsule Take 1 capsule (40 mg total) by mouth daily. 90 capsule 1  . sertraline (ZOLOFT) 50 MG tablet Take 1.5 tablets (75 mg total) by mouth daily. 45 tablet 6  . vitamin B-12 (CYANOCOBALAMIN) 1000 MCG tablet Take 1,000 mcg by mouth daily.    . Zoledronic Acid (ZOMETA IV) Inject 1 Dose into the vein every 30 (thirty) days.    . megestrol (MEGACE) 400 MG/10ML suspension Take 10 mLs (400 mg total) by mouth 2 (two) times daily. (Patient not taking: Reported on 09/02/2020) 240 mL 1  . nitroGLYCERIN (NITROSTAT) 0.4 MG SL tablet Place 1 tablet (0.4 mg total) under the tongue every 5 (five) minutes x 3 doses as needed for chest pain. (Patient not taking: Reported on 06/25/2020) 25 tablet 12  . polyethylene glycol (MIRALAX / GLYCOLAX) packet Take 17 g by mouth daily as needed for mild constipation.  (Patient not taking: Reported on 09/23/2020)    . senna (SENOKOT) 8.6 MG TABS tablet Take 2 tablets (17.2 mg total) by mouth daily. (Patient not taking: Reported on 11/04/2020) 120 tablet 0   No current facility-administered medications for this visit.   Facility-Administered Medications Ordered in Other Visits  Medication Dose Route Frequency Provider Last Rate Last Admin  . heparin lock flush 100 unit/mL  500 Units Intravenous Once Earlie Server, MD      . heparin lock flush 100 unit/mL  500 Units Intravenous Once Earlie Server, MD      . sodium chloride flush (NS) 0.9 % injection  10 mL  10 mL Intravenous PRN Earlie Server, MD   10 mL at 01/09/19 0820  . sodium chloride flush (NS) 0.9 % injection 10 mL  10 mL Intravenous PRN Earlie Server, MD   10 mL at 01/10/19 1400     PHYSICAL EXAMINATION: ECOG PERFORMANCE STATUS: 1 - Symptomatic but completely ambulatory Vitals:   11/25/20 0936  BP: 112/78  Pulse: 79  Resp: 18  Temp: 98.2 F (36.8 C)  SpO2: 100%   Filed Weights   11/25/20 0936  Weight: 143 lb 12.8 oz (65.2 kg)   Physical Exam Constitutional:      General: She is not in acute distress.    Appearance: She is not diaphoretic.     Comments: Patient sits in the wheelchair.  HENT:     Head: Normocephalic and atraumatic.     Nose: Nose normal.     Mouth/Throat:     Pharynx: No oropharyngeal exudate.  Eyes:     General: No scleral icterus.    Pupils: Pupils are equal, round, and reactive to light.  Cardiovascular:     Rate and Rhythm: Normal rate and regular rhythm.     Heart sounds: No murmur heard.   Pulmonary:     Effort: Pulmonary effort is normal. No respiratory distress.     Breath sounds: No rales.  Chest:     Chest wall: No tenderness.  Abdominal:     General: There is no distension.     Palpations: Abdomen is soft.     Tenderness: There is no abdominal tenderness.  Musculoskeletal:        General: Normal range of motion.     Cervical back: Normal range of motion and neck supple.  Skin:    General: Skin is warm and dry.     Findings: No erythema.  Neurological:     Mental Status: She is alert and oriented to person, place, and time.     Cranial Nerves: No cranial nerve deficit.     Motor: No abnormal muscle tone.     Coordination: Coordination normal.  Psychiatric:        Mood and Affect: Affect normal.        LABORATORY DATA:  I have reviewed the data as listed Lab Results  Component Value Date   WBC 5.8 11/25/2020   HGB 12.0 11/25/2020   HCT 35.2 (L) 11/25/2020   MCV 90.7 11/25/2020   PLT 188 11/25/2020   Recent Labs     08/12/20 1315 08/12/20 1315 09/02/20 1358 09/02/20 1358 09/23/20 0845 09/23/20 0845 10/14/20 0839 11/04/20 0804 11/25/20 0918  NA 134*   < > 137   < > 138   < > 134* 135 134*  K 4.0   < > 3.5   < > 4.0   < > 3.8 3.9 3.9  CL 104   < > 105   < > 106   < > 102 103 99  CO2 23   < > 24   < > 24   < > 22 24 25   GLUCOSE 98   < > 103*   < > 91   < > 75 101* 105*  BUN 13   < > 18   < > 14   < > 15 14 13   CREATININE 0.81   < > 0.68   < > 0.75   < > 0.93 0.84 0.96  CALCIUM 8.8*   < > 8.3*   < > 8.6*   < > 9.2 8.9 9.2  GFRNONAA >60   < > >60   < > >60  --  >60 >60 >60  GFRAA >60  --  >60  --  >60  --   --   --   --   PROT 7.4   < > 6.8   < > 7.0   < > 7.8 7.1 7.0  ALBUMIN 4.0   < > 3.7   < > 3.8   < > 4.2 3.8 3.9  AST 17   < > 14*   < > 17   < > 24 16 22   ALT 12   < > 11   < > 11   < > 12 10 12   ALKPHOS 49   < > 49   < > 47   < > 48 39 47  BILITOT 0.5   < > 0.5   < > 0.6   < > 0.7 0.5 0.6   < > = values in this interval not displayed.    RADIOGRAPHIC STUDIES: I have personally reviewed the radiological images as listed and agreed with the findings in the report.  MR Brain W Wo Contrast  Result Date: 11/20/2020 CLINICAL DATA:  Small cell lung cancer with prophylactic cranial radiation June 2020. Restaging EXAM: MRI HEAD WITHOUT AND WITH CONTRAST TECHNIQUE: Multiplanar, multiecho pulse sequences of the brain and surrounding structures were obtained without and with intravenous contrast. CONTRAST:  20mL GADAVIST GADOBUTROL 1 MMOL/ML IV SOLN COMPARISON:  04/12/2020 FINDINGS: Brain: No enhancement or swelling to suggest metastatic disease. 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 No interval discrete insult. Milder chronic small vessel changes are seen in the pons. No hemorrhage, hydrocephalus, or collection. Vascular: Normal flow voids. Skull and upper cervical spine: Normal marrow signal. Sinuses/Orbits:  Negative IMPRESSION: Stable post treatment brain.  No evidence of metastasis. Electronically Signed   By: Monte Fantasia M.D.   On: 11/20/2020 11:26   CT CHEST ABDOMEN PELVIS W CONTRAST  Result Date: 11/20/2020 CLINICAL DATA:  Small cell lung cancer follow up EXAM: CT CHEST, ABDOMEN, AND PELVIS WITH CONTRAST TECHNIQUE: Multidetector CT imaging of the chest, abdomen and pelvis was performed following the standard protocol during bolus administration of intravenous contrast. CONTRAST:  134mL OMNIPAQUE IOHEXOL 300 MG/ML  SOLN COMPARISON:  Multiple exams, including 07/25/2020 FINDINGS: CT CHEST FINDINGS Cardiovascular: Right Port-A-Cath tip: Cavoatrial junction. Coronary, aortic arch, and branch vessel atherosclerotic vascular disease. Mediastinum/Nodes: There is contrast medium in the esophagus suggesting dysmotility or reflux. No pathologic thoracic adenopathy is identified. Lungs/Pleura: Calcified granulomas in the right lung. Reduced size and prominence of right subpleural nodular densities at the right lateral lung base, with the component along the diaphragm essentially resolved and the component along the lateral pleural margin measuring about 0.7 cm in thickness (image 89 series 4), previously 1.0 cm. There is a small calcified granuloma in the left upper lobe. Increased bandlike atelectasis anteriorly in the left lower lobe. Centrilobular emphysema. Musculoskeletal: Deformity related to transverse upper sternal body fracture again observed, with increased bony bridging compared to the 07/25/2020 exam. Prominent wedge compression fracture at T7 with vertebral augmentation common not appreciably changed. Inferior endplate compression fracture at T4 and T8, unchanged. CT ABDOMEN PELVIS FINDINGS Hepatobiliary: Cholecystectomy.  Otherwise unremarkable. Pancreas: Unremarkable Spleen: Punctate calcification compatible with old granulomatous disease. Adrenals/Urinary Tract: At least partially duplicated right  renal collecting system. Pelvic floor laxity with cystocele extending 2.2 cm below the pubococcygeal line. Stomach/Bowel: Supraumbilical and umbilical hernias each containing loops of small bowel. More cephalad there is a ventral hernia mesh. No findings of strangulation or obstruction. Mildly low position of the anorectal junction due to pelvic floor laxity. Scattered colonic diverticula. Borderline wall thickening and possible accentuated mucosal enhancement in the nondistended descending and sigmoid colon, cannot exclude a mild distal colitis. Vascular/Lymphatic: Aortoiliac atherosclerotic vascular disease. Reproductive: Unremarkable Other: No supplemental non-categorized findings. Musculoskeletal: Ventral hernias as noted under the stomach/bowel section  above. Grade 1 degenerative anterolisthesis at L4-5 with mild right foraminal stenosis at this level due to disc uncovering facet spurring. IMPRESSION: 1. Reduced size and prominence of right lateral lung base subpleural nodular densities, with the component along the diaphragm essentially resolved. No findings of active malignancy. 2. Borderline wall thickening and possible accentuated mucosal enhancement in the nondistended descending and sigmoid colon, cannot exclude a mild distal colitis. 3. Other imaging findings of potential clinical significance: Coronary, aortic arch, and branch vessel atherosclerotic vascular disease. At least partially duplicated right renal collecting system. Pelvic floor laxity with cystocele extending 2.2 cm below the pubococcygeal line. Supraumbilical and umbilical hernias each containing loops of small bowel. Compression fractures at T4, T8, T7, and T8 as before. The sternal fracture shows increased bony bridging compared to the 07/25/2020 exam. Grade 1 degenerative anterolisthesis at L4-5 with mild right foraminal stenosis at this level due to disc uncovering facet spurring. 4. Emphysema and aortic atherosclerosis. Aortic  Atherosclerosis (ICD10-I70.0) and Emphysema (ICD10-J43.9). Electronically Signed   By: Van Clines M.D.   On: 11/20/2020 12:27     ASSESSMENT & PLAN:  1. Small cell lung cancer ([place])   2. Encounter for antineoplastic immunotherapy   3. Diarrhea, unspecified type   4. Osteopenia, unspecified location    #Extensive small cell lung cancer, S/p 4 cycles of Carboplatin, Etoposide and Tecentriq.   Status post chest and whole brain radiation.  Currently on Tecentriq maintenance. Labs are reviewed and discussed with patient. Hold Tecentriq due to diarrhea.  11/20/2020 Surveillance CT scan  Reduced size and prominence of right lateral lung base subpleural nodular density. No findings of active malignancy.  MRI brain showed stable post treatment brain. No metastatic disease.   Borderline wall thickening, and mucosual enhancement in the non distended descending and sigmoid colon, can not exclude distal colitis.  Diarrhea, check c diff. Hold treatment.  Encourage oral hydration.   #Osteopenia continue calcium and vitamin D supplement.   History bone metastasis, On Zometa every 4 to 6 weeks, in the setting of extensive small cell lung cancer.. Last Zometa 11/04/2020   Follow up in 2 weeks for eval with next cycle of tecentriq    Earlie Server, MD, PhD

## 2020-11-25 NOTE — Progress Notes (Signed)
Patient here for follow up. Reports that she had diarrhea for about 4 days but took imodium and it got better.

## 2020-11-27 ENCOUNTER — Other Ambulatory Visit: Payer: Self-pay

## 2020-11-27 DIAGNOSIS — R197 Diarrhea, unspecified: Secondary | ICD-10-CM | POA: Diagnosis not present

## 2020-11-27 DIAGNOSIS — Z5112 Encounter for antineoplastic immunotherapy: Secondary | ICD-10-CM | POA: Diagnosis not present

## 2020-11-27 DIAGNOSIS — C7951 Secondary malignant neoplasm of bone: Secondary | ICD-10-CM | POA: Diagnosis not present

## 2020-11-27 DIAGNOSIS — C3490 Malignant neoplasm of unspecified part of unspecified bronchus or lung: Secondary | ICD-10-CM | POA: Diagnosis not present

## 2020-11-27 DIAGNOSIS — M8580 Other specified disorders of bone density and structure, unspecified site: Secondary | ICD-10-CM | POA: Diagnosis not present

## 2020-11-27 DIAGNOSIS — Z993 Dependence on wheelchair: Secondary | ICD-10-CM | POA: Diagnosis not present

## 2020-11-27 LAB — C DIFFICILE QUICK SCREEN W PCR REFLEX
C Diff antigen: NEGATIVE
C Diff interpretation: NOT DETECTED
C Diff toxin: NEGATIVE

## 2020-12-05 ENCOUNTER — Inpatient Hospital Stay: Payer: Medicare Other

## 2020-12-05 ENCOUNTER — Inpatient Hospital Stay (HOSPITAL_BASED_OUTPATIENT_CLINIC_OR_DEPARTMENT_OTHER): Payer: Medicare Other | Admitting: Oncology

## 2020-12-05 ENCOUNTER — Encounter: Payer: Self-pay | Admitting: Oncology

## 2020-12-05 VITALS — BP 104/73 | HR 89 | Temp 97.8°F | Resp 18 | Wt 142.8 lb

## 2020-12-05 DIAGNOSIS — Z5112 Encounter for antineoplastic immunotherapy: Secondary | ICD-10-CM | POA: Diagnosis not present

## 2020-12-05 DIAGNOSIS — C3490 Malignant neoplasm of unspecified part of unspecified bronchus or lung: Secondary | ICD-10-CM

## 2020-12-05 DIAGNOSIS — Z8589 Personal history of malignant neoplasm of other organs and systems: Secondary | ICD-10-CM | POA: Diagnosis not present

## 2020-12-05 DIAGNOSIS — R197 Diarrhea, unspecified: Secondary | ICD-10-CM | POA: Insufficient documentation

## 2020-12-05 DIAGNOSIS — M8580 Other specified disorders of bone density and structure, unspecified site: Secondary | ICD-10-CM | POA: Diagnosis not present

## 2020-12-05 DIAGNOSIS — C7951 Secondary malignant neoplasm of bone: Secondary | ICD-10-CM | POA: Diagnosis not present

## 2020-12-05 DIAGNOSIS — Z993 Dependence on wheelchair: Secondary | ICD-10-CM | POA: Diagnosis not present

## 2020-12-05 LAB — CBC WITH DIFFERENTIAL/PLATELET
Abs Immature Granulocytes: 0.04 10*3/uL (ref 0.00–0.07)
Basophils Absolute: 0 10*3/uL (ref 0.0–0.1)
Basophils Relative: 1 %
Eosinophils Absolute: 0.3 10*3/uL (ref 0.0–0.5)
Eosinophils Relative: 5 %
HCT: 37.9 % (ref 36.0–46.0)
Hemoglobin: 12.8 g/dL (ref 12.0–15.0)
Immature Granulocytes: 1 %
Lymphocytes Relative: 23 %
Lymphs Abs: 1.4 10*3/uL (ref 0.7–4.0)
MCH: 30.2 pg (ref 26.0–34.0)
MCHC: 33.8 g/dL (ref 30.0–36.0)
MCV: 89.4 fL (ref 80.0–100.0)
Monocytes Absolute: 0.7 10*3/uL (ref 0.1–1.0)
Monocytes Relative: 12 %
Neutro Abs: 3.5 10*3/uL (ref 1.7–7.7)
Neutrophils Relative %: 58 %
Platelets: 166 10*3/uL (ref 150–400)
RBC: 4.24 MIL/uL (ref 3.87–5.11)
RDW: 13.2 % (ref 11.5–15.5)
WBC: 6 10*3/uL (ref 4.0–10.5)
nRBC: 0 % (ref 0.0–0.2)

## 2020-12-05 LAB — COMPREHENSIVE METABOLIC PANEL
ALT: 12 U/L (ref 0–44)
AST: 18 U/L (ref 15–41)
Albumin: 4.2 g/dL (ref 3.5–5.0)
Alkaline Phosphatase: 49 U/L (ref 38–126)
Anion gap: 12 (ref 5–15)
BUN: 21 mg/dL (ref 8–23)
CO2: 21 mmol/L — ABNORMAL LOW (ref 22–32)
Calcium: 9.7 mg/dL (ref 8.9–10.3)
Chloride: 100 mmol/L (ref 98–111)
Creatinine, Ser: 0.96 mg/dL (ref 0.44–1.00)
GFR, Estimated: >60 mL/min (ref >60)
Glucose, Bld: 93 mg/dL (ref 70–99)
Potassium: 3.9 mmol/L (ref 3.5–5.1)
Sodium: 133 mmol/L — ABNORMAL LOW (ref 135–145)
Total Bilirubin: 0.6 mg/dL (ref 0.3–1.2)
Total Protein: 7.7 g/dL (ref 6.5–8.1)

## 2020-12-05 NOTE — Progress Notes (Signed)
Patient has a productive cough that is painful during coughing episodes.

## 2020-12-05 NOTE — Progress Notes (Signed)
Hematology/Oncology Follow up note [facility] Telephone:([phone] Fax:([phone]) [phone]   Patient Care Team: Glean Hess, MD as PCP - General (Internal Medicine) Charolette Forward, MD as Consulting Physician (Cardiology) Telford Nab, RN as Registered Nurse [name], MD as Radiation Oncologist (Radiation Oncology)  REFERRING PROVIDER: Dr. Felicie Morn REASON FOR VISIT:  Follow-up for small cell lung cancer,   HISTORY OF PRESENTING ILLNESS:  Kristi [name] is a  74 y.o.  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  10/20/2018 CT chest with contrast showed large mediastinal mass involving both hilar, left greater than right, consistent with lung carcinoma, The mass causes narrowing of the left mainstem bronchus with resultant volume loss on the left and a mediastinal shift to the left.  Moderate size left pleural effusion. 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  # Initial MRI brain negative.  # Nov 2019- Jan 2020 s/p 4 cycles of Carbo/Etoposide/Tecentriq #  01/24/2019 interim CT scan done which showed continued positive response to therapy with continued reduction in mediastinal adenopathy.  No residual measurable left lung mass. CT findings of acute emphysematous cystitis. Urology did not feel that patient has pyelonephritis and recommend treatment with antibiotics because patient's immunocompromise. Patient finished treatment.   # 11/02/2018-01/09/2019 Chemotherapy carboplatin + Etoposide + tecentriq # Consolidation chest radiation and whole brain radiation finished in May 2020 # 04/25/2019 resume Tecentriq every 3 weeks. # Patient had a fall from her stairs on 01/19/2020. In the emergency room she had CT chest abdomen pelvis CT, CT head without contrast, CT cervical spine without contrast. No CT evidence for acute  intracranial abnormality, degenerative changes of cervical spine..  No CT evidence of acute thoracic abdomen injury.  Severe compression fracture of T7, subacute.  # kyphoplasty procedure on 03/01/2020.  T7 biopsy negative for cancer.  11/20/2020 Surveillance CT scan  Reduced size and prominence of right lateral lung base subpleural nodular density. No findings of active malignancy.  MRI brain showed stable post treatment brain. No metastatic disease.    INTERVAL HISTORY Kristi [name] is a 74 y.o. female who has above history reviewed by me today presents for evaluation prior to immunotherapy for treatment of extensive small cell lung cancer. Patient is on immunotherapy maintenance. Cough, productive with clear phlegm, chronic, she reports recently she coughs out more clear phlegm. No worsening of SOB or wheezing. No fever chills. She also feels pain when she coughs.   She has diarrhea last week, symptoms started after received oral contrast for her recent CT. She had multiple loose bowelments last week and C diff was negative. She utilized imodium as instructed and diarrhea is better.  She had formed bowel movement on 12/13, and no BM on 12/14 and 12/15. She took Senna on 12/14 and had loose bowel movement this morning. No nausea vomiting.     ..  Review of Systems  Constitutional: Positive for fatigue. Negative for appetite change, chills and fever.  HENT:   Negative for hearing loss and voice change.   Eyes: Negative for eye problems.  Respiratory: Negative for chest tightness, cough and shortness of breath.   Cardiovascular: Negative for chest pain.  Gastrointestinal: Positive for diarrhea. Negative for abdominal distention, abdominal pain, blood in stool and nausea.  Endocrine: Negative for hot flashes.  Genitourinary: Negative for difficulty urinating and frequency.   Musculoskeletal: Negative for arthralgias and back pain.  Skin: Negative for itching and rash.  Neurological:  Negative for extremity weakness and headaches.  Hematological: Negative for adenopathy.  Psychiatric/Behavioral: Negative for confusion and depression. The patient is not nervous/anxious.        Forgetful     MEDICAL HISTORY:  Past Medical History:  Diagnosis Date  . Acute MI, inferoposterior wall ([place]) 09/30/2014  . Claustrophobia   . Coronary artery disease   . GERD (gastroesophageal reflux disease)   . Hypercholesteremia   . Hypertension   . MI, old   . Pneumonia   . Small cell lung cancer ([place])   . 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 [facility]) 10/27/2018    SURGICAL HISTORY: Past Surgical History:  Procedure Laterality Date  . APPENDECTOMY     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  . BLADDER NECK SUSPENSION    . CHOLECYSTECTOMY N/A 08/14/2018   Procedure: LAPAROSCOPIC CHOLECYSTECTOMY;  Surgeon: Jules Husbands, MD;  Location: [facility] ORS;  Service: General;  Laterality: N/A;  . CHOLECYSTECTOMY  11/2018  . CORONARY ANGIOPLASTY  09/29/2014  . CORONARY STENT PLACEMENT    . ENDOBRONCHIAL ULTRASOUND N/A 10/21/2018   Procedure: ENDOBRONCHIAL ULTRASOUND;  Surgeon: Laverle Hobby, MD;  Location: [facility] ORS;  Service: Pulmonary;  Laterality: N/A;  . HERNIA REPAIR    . IR FLUORO GUIDE CV LINE RIGHT  12/19/2018  . KYPHOPLASTY N/A 03/01/2020   Procedure: T7 KYPHOPLASTY;  Surgeon: Hessie Knows, MD;  Location: [facility] ORS;  Service: Orthopedics;  Laterality: N/A;  . LEFT HEART CATHETERIZATION WITH CORONARY ANGIOGRAM N/A 09/29/2014   Procedure: LEFT HEART CATHETERIZATION WITH CORONARY ANGIOGRAM;  Surgeon: Clent Demark, MD;  Location: [place] CATH LAB;  Service: Cardiovascular;  Laterality: N/A;  . PORTA CATH INSERTION N/A 01/02/2019   Procedure: PORTA CATH INSERTION;  Surgeon: Algernon Huxley, MD;  Location: [place] CV LAB;  Service: Cardiovascular;  Laterality: N/A;  . PORTACATH PLACEMENT Right 10/28/2018   Procedure: INSERTION PORT-A-CATH;  Surgeon: Jules Husbands, MD;  Location: [facility] ORS;  Service:  General;  Laterality: Right;    SOCIAL HISTORY: Social History   Socioeconomic History  . Marital status: Married    Spouse name: [name]   . Number of children: 3  . Years of education: Not on file  . Highest education level: Not on file  Occupational History  . Occupation: Retired    Comment: Chief Technology Officer   Tobacco Use  . Smoking status: Former Smoker    Packs/day: 1.00    Years: 39.00    Pack years: 39.00    Types: Cigarettes    Start date: 12/21/1978    Quit date: 10/16/2018    Years since quitting: 2.1  . Smokeless tobacco: Never Used  Vaping Use  . Vaping Use: Never used  Substance and Sexual Activity  . Alcohol use: Yes    Alcohol/week: 0.0 standard drinks    Comment: occassional - approx 1 every 2 weeks   . Drug use: No  . Sexual activity: Yes    Birth control/protection: None  Other Topics Concern  . Not on file  Social History Narrative  . Not on file   Social Determinants of Health   Financial Resource Strain: Not on file  Food Insecurity: Not on file  Transportation Needs: Not on file  Physical Activity: Not on file  Stress: Not on file  Social Connections: Not on file  Intimate Partner Violence: Not on file    FAMILY HISTORY: Family History  Problem Relation Age of Onset  .  Alzheimer's disease Mother   . Colon cancer Father   . Breast cancer Neg Hx     ALLERGIES:  has No Known Allergies.  MEDICATIONS:  Current Outpatient Medications  Medication Sig Dispense Refill  . acetaminophen (TYLENOL) 500 MG tablet Take 500 mg by mouth 2 (two) times daily as needed for moderate pain or headache.    Huey Bienenstock (TECENTRIQ IV) Inject 1 Dose into the vein every 30 (thirty) days.    . Cholecalciferol (DIALYVITE VITAMIN D 5000) 125 MCG (5000 UT) capsule Take 5,000 Units by mouth daily.    . diphenhydrAMINE-zinc acetate (BENADRYL EXTRA STRENGTH) cream Apply 1 application topically 3 (three) times daily as needed for itching. 28.4 g 0  .  HYDROcodone-acetaminophen (NORCO/VICODIN) 5-325 MG tablet Take 1 tablet by mouth every 6 (six) hours as needed for moderate pain. 90 tablet 0  . lidocaine-prilocaine (EMLA) cream Apply 1 application topically daily as needed (port access). 30 g 2  . loperamide (IMODIUM A-D) 2 MG tablet Take 2 mg by mouth 4 (four) times daily as needed for diarrhea or loose stools.     [name] Kitchen omeprazole (PRILOSEC) 40 MG capsule Take 1 capsule (40 mg total) by mouth daily. 90 capsule 1  . sertraline (ZOLOFT) 50 MG tablet Take 1.5 tablets (75 mg total) by mouth daily. 45 tablet 6  . vitamin B-12 (CYANOCOBALAMIN) 1000 MCG tablet Take 1,000 mcg by mouth daily.    . Zoledronic Acid (ZOMETA IV) Inject 1 Dose into the vein every 30 (thirty) days.    . megestrol (MEGACE) 400 MG/10ML suspension Take 10 mLs (400 mg total) by mouth 2 (two) times daily. (Patient not taking: No sig reported) 240 mL 1  . nitroGLYCERIN (NITROSTAT) 0.4 MG SL tablet Place 1 tablet (0.4 mg total) under the tongue every 5 (five) minutes x 3 doses as needed for chest pain. (Patient not taking: No sig reported) 25 tablet 12  . polyethylene glycol (MIRALAX / GLYCOLAX) packet Take 17 g by mouth daily as needed for mild constipation.  (Patient not taking: No sig reported)    . senna (SENOKOT) 8.6 MG TABS tablet Take 2 tablets (17.2 mg total) by mouth daily. (Patient not taking: No sig reported) 120 tablet 0   No current facility-administered medications for this visit.   Facility-Administered Medications Ordered in Other Visits  Medication Dose Route Frequency Provider Last Rate Last Admin  . heparin lock flush 100 unit/mL  500 Units Intravenous Once Earlie Server, MD      . heparin lock flush 100 unit/mL  500 Units Intravenous Once Earlie Server, MD      . sodium chloride flush (NS) 0.9 % injection 10 mL  10 mL Intravenous PRN Earlie Server, MD   10 mL at 01/09/19 0820  . sodium chloride flush (NS) 0.9 % injection 10 mL  10 mL Intravenous PRN Earlie Server, MD   10 mL at  01/10/19 1400     PHYSICAL EXAMINATION: ECOG PERFORMANCE STATUS: 1 - Symptomatic but completely ambulatory Vitals:   12/05/20 0853  BP: 104/73  Pulse: 89  Resp: 18  Temp: 97.8 F (36.6 C)  SpO2: 99%   Filed Weights   12/05/20 0853  Weight: 142 lb 12.8 oz (64.8 kg)   Physical Exam Constitutional:      General: She is not in acute distress.    Appearance: She is not diaphoretic.     Comments: Patient sits in the wheelchair.  HENT:     Head: Normocephalic and atraumatic.  Nose: Nose normal.     Mouth/Throat:     Pharynx: No oropharyngeal exudate.  Eyes:     General: No scleral icterus.    Pupils: Pupils are equal, round, and reactive to light.  Cardiovascular:     Rate and Rhythm: Normal rate and regular rhythm.     Heart sounds: No murmur heard.   Pulmonary:     Effort: Pulmonary effort is normal. No respiratory distress.     Breath sounds: No wheezing or rales.     Comments: Decreased breath sounds bilaterally.  Abdominal:     General: There is no distension.     Palpations: Abdomen is soft.     Tenderness: There is no abdominal tenderness.  Musculoskeletal:        General: Normal range of motion.     Cervical back: Normal range of motion and neck supple.  Skin:    General: Skin is warm and dry.     Findings: No erythema.  Neurological:     Mental Status: She is alert and oriented to person, place, and time.     Cranial Nerves: No cranial nerve deficit.     Motor: No abnormal muscle tone.     Coordination: Coordination normal.  Psychiatric:        Mood and Affect: Affect normal.        LABORATORY DATA:  I have reviewed the data as listed Lab Results  Component Value Date   WBC 6.0 12/05/2020   HGB 12.8 12/05/2020   HCT 37.9 12/05/2020   MCV 89.4 12/05/2020   PLT 166 12/05/2020   Recent Labs    08/12/20 1315 09/02/20 1358 09/23/20 0845 10/14/20 0839 11/04/20 0804 11/25/20 0918 12/05/20 0830  NA 134* 137 138   < > 135 134* 133*  K  4.0 3.5 4.0   < > 3.9 3.9 3.9  CL 104 105 106   < > 103 99 100  CO2 23 24 24    < > 24 25 21*  GLUCOSE 98 103* 91   < > 101* 105* 93  BUN 13 18 14    < > 14 13 21   CREATININE 0.81 0.68 0.75   < > 0.84 0.96 0.96  CALCIUM 8.8* 8.3* 8.6*   < > 8.9 9.2 9.7  GFRNONAA >60 >60 >60   < > >60 >60 >60  GFRAA >60 >60 >60  --   --   --   --   PROT 7.4 6.8 7.0   < > 7.1 7.0 7.7  ALBUMIN 4.0 3.7 3.8   < > 3.8 3.9 4.2  AST 17 14* 17   < > 16 22 18   ALT 12 11 11    < > 10 12 12   ALKPHOS 49 49 47   < > 39 47 49  BILITOT 0.5 0.5 0.6   < > 0.5 0.6 0.6   < > = values in this interval not displayed.    RADIOGRAPHIC STUDIES: I have personally reviewed the radiological images as listed and agreed with the findings in the report.  MR Brain W Wo Contrast  Result Date: 11/20/2020 CLINICAL DATA:  Small cell lung cancer with prophylactic cranial radiation June 2020. Restaging EXAM: MRI HEAD WITHOUT AND WITH CONTRAST TECHNIQUE: Multiplanar, multiecho pulse sequences of the brain and surrounding structures were obtained without and with intravenous contrast. CONTRAST:  21mL GADAVIST GADOBUTROL 1 MMOL/ML IV SOLN COMPARISON:  04/12/2020 FINDINGS: Brain: No enhancement or swelling to suggest metastatic disease. Confluent FLAIR  hyperintensity in the cerebral white matter attributed to chronic small vessel disease after whole-brain radiotherapy. No interval discrete insult. Milder chronic small vessel changes are seen in the pons. No hemorrhage, hydrocephalus, or collection. Vascular: Normal flow voids. Skull and upper cervical spine: Normal marrow signal. Sinuses/Orbits: Negative IMPRESSION: Stable post treatment brain.  No evidence of metastasis. Electronically Signed   By: Monte Fantasia M.D.   On: 11/20/2020 11:26   CT CHEST ABDOMEN PELVIS W CONTRAST  Result Date: 11/20/2020 CLINICAL DATA:  Small cell lung cancer follow up EXAM: CT CHEST, ABDOMEN, AND PELVIS WITH CONTRAST TECHNIQUE: Multidetector CT imaging of the chest,  abdomen and pelvis was performed following the standard protocol during bolus administration of intravenous contrast. CONTRAST:  141mL OMNIPAQUE IOHEXOL 300 MG/ML  SOLN COMPARISON:  Multiple exams, including 07/25/2020 FINDINGS: CT CHEST FINDINGS Cardiovascular: Right Port-A-Cath tip: Cavoatrial junction. Coronary, aortic arch, and branch vessel atherosclerotic vascular disease. Mediastinum/Nodes: There is contrast medium in the esophagus suggesting dysmotility or reflux. No pathologic thoracic adenopathy is identified. Lungs/Pleura: Calcified granulomas in the right lung. Reduced size and prominence of right subpleural nodular densities at the right lateral lung base, with the component along the diaphragm essentially resolved and the component along the lateral pleural margin measuring about 0.7 cm in thickness (image 89 series 4), previously 1.0 cm. There is a small calcified granuloma in the left upper lobe. Increased bandlike atelectasis anteriorly in the left lower lobe. Centrilobular emphysema. Musculoskeletal: Deformity related to transverse upper sternal body fracture again observed, with increased bony bridging compared to the 07/25/2020 exam. Prominent wedge compression fracture at T7 with vertebral augmentation common not appreciably changed. Inferior endplate compression fracture at T4 and T8, unchanged. CT ABDOMEN PELVIS FINDINGS Hepatobiliary: Cholecystectomy.  Otherwise unremarkable. Pancreas: Unremarkable Spleen: Punctate calcification compatible with old granulomatous disease. Adrenals/Urinary Tract: At least partially duplicated right renal collecting system. Pelvic floor laxity with cystocele extending 2.2 cm below the pubococcygeal line. Stomach/Bowel: Supraumbilical and umbilical hernias each containing loops of small bowel. More cephalad there is a ventral hernia mesh. No findings of strangulation or obstruction. Mildly low position of the anorectal junction due to pelvic floor laxity.  Scattered colonic diverticula. Borderline wall thickening and possible accentuated mucosal enhancement in the nondistended descending and sigmoid colon, cannot exclude a mild distal colitis. Vascular/Lymphatic: Aortoiliac atherosclerotic vascular disease. Reproductive: Unremarkable Other: No supplemental non-categorized findings. Musculoskeletal: Ventral hernias as noted under the stomach/bowel section above. Grade 1 degenerative anterolisthesis at L4-5 with mild right foraminal stenosis at this level due to disc uncovering facet spurring. IMPRESSION: 1. Reduced size and prominence of right lateral lung base subpleural nodular densities, with the component along the diaphragm essentially resolved. No findings of active malignancy. 2. Borderline wall thickening and possible accentuated mucosal enhancement in the nondistended descending and sigmoid colon, cannot exclude a mild distal colitis. 3. Other imaging findings of potential clinical significance: Coronary, aortic arch, and branch vessel atherosclerotic vascular disease. At least partially duplicated right renal collecting system. Pelvic floor laxity with cystocele extending 2.2 cm below the pubococcygeal line. Supraumbilical and umbilical hernias each containing loops of small bowel. Compression fractures at T4, T8, T7, and T8 as before. The sternal fracture shows increased bony bridging compared to the 07/25/2020 exam. Grade 1 degenerative anterolisthesis at L4-5 with mild right foraminal stenosis at this level due to disc uncovering facet spurring. 4. Emphysema and aortic atherosclerosis. Aortic Atherosclerosis (ICD10-I70.0) and Emphysema (ICD10-J43.9). Electronically Signed   By: Van Clines M.D.   On: 11/20/2020 12:27  ASSESSMENT & PLAN:  1. Small cell lung cancer ([place])   2. Encounter for antineoplastic immunotherapy   3. Diarrhea, unspecified type   4. 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   Status post chest and whole brain radiation.  Currently on Tecentriq maintenance. Labs are reviewed and discussed with patient. Ok to proceed with Tecentriq on 12/09/20, assuming diarrhea is not worse.   # Diarrhea, some ? Mild colitis findings on recent CT, last treatment was held. C diff is negative.  Possible due to oral contrast or due to immunotherapy.  Symptoms are better. Continue to monitor, assuming no worsening symptoms, she proceeds with immunotherapy on 12/20.  She and her husband agree with the plan.   # History bone metastasis, #Osteopenia she will proceed with zometa every 4-6 weeks. Will get on 12/09/20.  Continue calcium and vitamin D.  # COPD/emphysema, her cough symptoms are worse, no greenish or yellowish sputum. Monitor.  Follow up in 3weeks for eval with next cycle of tecentriq    Earlie Server, MD, PhD

## 2020-12-06 ENCOUNTER — Telehealth: Payer: Self-pay

## 2020-12-06 NOTE — Telephone Encounter (Signed)
Called pt, left a message

## 2020-12-06 NOTE — Telephone Encounter (Signed)
We have not seen this patient since 2019. Please call her to schedule a follow up with Dr. Army Melia in January some time when she is available.  Thank you.

## 2020-12-09 ENCOUNTER — Inpatient Hospital Stay: Payer: Medicare Other

## 2020-12-09 VITALS — BP 107/76 | HR 75 | Temp 98.1°F | Resp 18

## 2020-12-09 DIAGNOSIS — C7951 Secondary malignant neoplasm of bone: Secondary | ICD-10-CM | POA: Diagnosis not present

## 2020-12-09 DIAGNOSIS — E860 Dehydration: Secondary | ICD-10-CM

## 2020-12-09 DIAGNOSIS — M8580 Other specified disorders of bone density and structure, unspecified site: Secondary | ICD-10-CM | POA: Diagnosis not present

## 2020-12-09 DIAGNOSIS — Z5112 Encounter for antineoplastic immunotherapy: Secondary | ICD-10-CM | POA: Diagnosis not present

## 2020-12-09 DIAGNOSIS — C3490 Malignant neoplasm of unspecified part of unspecified bronchus or lung: Secondary | ICD-10-CM | POA: Diagnosis not present

## 2020-12-09 DIAGNOSIS — R197 Diarrhea, unspecified: Secondary | ICD-10-CM | POA: Diagnosis not present

## 2020-12-09 DIAGNOSIS — Z993 Dependence on wheelchair: Secondary | ICD-10-CM | POA: Diagnosis not present

## 2020-12-09 MED ORDER — SODIUM CHLORIDE 0.9 % IV SOLN
1200.0000 mg | Freq: Once | INTRAVENOUS | Status: AC
  Administered 2020-12-09: 10:00:00 1200 mg via INTRAVENOUS
  Filled 2020-12-09: qty 20

## 2020-12-09 MED ORDER — ZOLEDRONIC ACID 4 MG/100ML IV SOLN
4.0000 mg | Freq: Once | INTRAVENOUS | Status: AC
  Administered 2020-12-09: 10:00:00 4 mg via INTRAVENOUS
  Filled 2020-12-09: qty 100

## 2020-12-09 MED ORDER — SODIUM CHLORIDE 0.9 % IV SOLN
Freq: Once | INTRAVENOUS | Status: AC
  Filled 2020-12-09: qty 250

## 2020-12-09 MED ORDER — HEPARIN SOD (PORK) LOCK FLUSH 100 UNIT/ML IV SOLN
500.0000 [IU] | Freq: Once | INTRAVENOUS | Status: AC | PRN
  Administered 2020-12-09: 11:00:00 500 [IU]
  Filled 2020-12-09: qty 5

## 2020-12-09 MED ORDER — DEXAMETHASONE SODIUM PHOSPHATE 10 MG/ML IJ SOLN
10.0000 mg | Freq: Once | INTRAMUSCULAR | Status: AC
  Administered 2020-12-09: 09:00:00 10 mg via INTRAVENOUS
  Filled 2020-12-09: qty 1

## 2020-12-09 NOTE — Progress Notes (Signed)
Pt tolerated infusion well. Pt stable at discharge. 

## 2020-12-25 ENCOUNTER — Ambulatory Visit: Payer: Medicare Other | Admitting: Internal Medicine

## 2020-12-30 ENCOUNTER — Inpatient Hospital Stay (HOSPITAL_BASED_OUTPATIENT_CLINIC_OR_DEPARTMENT_OTHER): Payer: Medicare Other | Admitting: Oncology

## 2020-12-30 ENCOUNTER — Inpatient Hospital Stay: Payer: Medicare Other | Attending: Oncology

## 2020-12-30 ENCOUNTER — Inpatient Hospital Stay: Payer: Medicare Other

## 2020-12-30 ENCOUNTER — Encounter: Payer: Self-pay | Admitting: Oncology

## 2020-12-30 ENCOUNTER — Other Ambulatory Visit: Payer: Self-pay

## 2020-12-30 VITALS — BP 104/67 | HR 66 | Temp 98.5°F | Resp 16 | Wt 142.4 lb

## 2020-12-30 DIAGNOSIS — C3490 Malignant neoplasm of unspecified part of unspecified bronchus or lung: Secondary | ICD-10-CM

## 2020-12-30 DIAGNOSIS — Z87891 Personal history of nicotine dependence: Secondary | ICD-10-CM | POA: Diagnosis not present

## 2020-12-30 DIAGNOSIS — R5383 Other fatigue: Secondary | ICD-10-CM

## 2020-12-30 DIAGNOSIS — R413 Other amnesia: Secondary | ICD-10-CM

## 2020-12-30 DIAGNOSIS — Z5112 Encounter for antineoplastic immunotherapy: Secondary | ICD-10-CM | POA: Insufficient documentation

## 2020-12-30 DIAGNOSIS — M8580 Other specified disorders of bone density and structure, unspecified site: Secondary | ICD-10-CM | POA: Diagnosis not present

## 2020-12-30 DIAGNOSIS — Z8589 Personal history of malignant neoplasm of other organs and systems: Secondary | ICD-10-CM | POA: Diagnosis not present

## 2020-12-30 DIAGNOSIS — Z79899 Other long term (current) drug therapy: Secondary | ICD-10-CM | POA: Diagnosis not present

## 2020-12-30 LAB — CBC WITH DIFFERENTIAL/PLATELET
Abs Immature Granulocytes: 0.03 10*3/uL (ref 0.00–0.07)
Basophils Absolute: 0 10*3/uL (ref 0.0–0.1)
Basophils Relative: 1 %
Eosinophils Absolute: 0.2 10*3/uL (ref 0.0–0.5)
Eosinophils Relative: 5 %
HCT: 35.6 % — ABNORMAL LOW (ref 36.0–46.0)
Hemoglobin: 12 g/dL (ref 12.0–15.0)
Immature Granulocytes: 1 %
Lymphocytes Relative: 26 %
Lymphs Abs: 1.4 10*3/uL (ref 0.7–4.0)
MCH: 30 pg (ref 26.0–34.0)
MCHC: 33.7 g/dL (ref 30.0–36.0)
MCV: 89 fL (ref 80.0–100.0)
Monocytes Absolute: 0.8 10*3/uL (ref 0.1–1.0)
Monocytes Relative: 14 %
Neutro Abs: 2.9 10*3/uL (ref 1.7–7.7)
Neutrophils Relative %: 53 %
Platelets: 168 10*3/uL (ref 150–400)
RBC: 4 MIL/uL (ref 3.87–5.11)
RDW: 12.8 % (ref 11.5–15.5)
WBC: 5.3 10*3/uL (ref 4.0–10.5)
nRBC: 0 % (ref 0.0–0.2)

## 2020-12-30 LAB — COMPREHENSIVE METABOLIC PANEL
ALT: 12 U/L (ref 0–44)
AST: 20 U/L (ref 15–41)
Albumin: 4.2 g/dL (ref 3.5–5.0)
Alkaline Phosphatase: 42 U/L (ref 38–126)
Anion gap: 11 (ref 5–15)
BUN: 14 mg/dL (ref 8–23)
CO2: 22 mmol/L (ref 22–32)
Calcium: 9.2 mg/dL (ref 8.9–10.3)
Chloride: 102 mmol/L (ref 98–111)
Creatinine, Ser: 0.84 mg/dL (ref 0.44–1.00)
GFR, Estimated: >60 mL/min (ref >60)
Glucose, Bld: 111 mg/dL — ABNORMAL HIGH (ref 70–99)
Potassium: 3.8 mmol/L (ref 3.5–5.1)
Sodium: 135 mmol/L (ref 135–145)
Total Bilirubin: 0.5 mg/dL (ref 0.3–1.2)
Total Protein: 7.3 g/dL (ref 6.5–8.1)

## 2020-12-30 LAB — T4, FREE: Free T4: 0.99 ng/dL (ref 0.61–1.12)

## 2020-12-30 LAB — TSH: TSH: 1.412 u[IU]/mL (ref 0.350–4.500)

## 2020-12-30 MED ORDER — SODIUM CHLORIDE 0.9% FLUSH
10.0000 mL | Freq: Once | INTRAVENOUS | Status: AC
  Administered 2020-12-30: 10 mL via INTRAVENOUS
  Filled 2020-12-30: qty 10

## 2020-12-30 MED ORDER — HEPARIN SOD (PORK) LOCK FLUSH 100 UNIT/ML IV SOLN
500.0000 [IU] | Freq: Once | INTRAVENOUS | Status: DC | PRN
  Filled 2020-12-30: qty 5

## 2020-12-30 MED ORDER — HEPARIN SOD (PORK) LOCK FLUSH 100 UNIT/ML IV SOLN
500.0000 [IU] | Freq: Once | INTRAVENOUS | Status: AC
  Administered 2020-12-30: 500 [IU] via INTRAVENOUS
  Filled 2020-12-30: qty 5

## 2020-12-30 MED ORDER — DEXAMETHASONE SODIUM PHOSPHATE 10 MG/ML IJ SOLN: 10.0000 mg | Freq: Once | INTRAMUSCULAR | Status: DC

## 2020-12-30 MED ORDER — SODIUM CHLORIDE 0.9 % IV SOLN
10.0000 mg | Freq: Once | INTRAVENOUS | Status: AC
  Administered 2020-12-30: 10 mg via INTRAVENOUS
  Filled 2020-12-30: qty 10

## 2020-12-30 MED ORDER — HEPARIN SOD (PORK) LOCK FLUSH 100 UNIT/ML IV SOLN
INTRAVENOUS | Status: AC
  Filled 2020-12-30: qty 5

## 2020-12-30 MED ORDER — SODIUM CHLORIDE 0.9 % IV SOLN
Freq: Once | INTRAVENOUS | Status: AC
  Filled 2020-12-30: qty 250

## 2020-12-30 MED ORDER — SODIUM CHLORIDE 0.9 % IV SOLN
1200.0000 mg | Freq: Once | INTRAVENOUS | Status: AC
  Administered 2020-12-30: 1200 mg via INTRAVENOUS
  Filled 2020-12-30: qty 20

## 2020-12-30 NOTE — Progress Notes (Signed)
Hematology/Oncology Follow up note [facility] [phone] [phone]   Patient Care Team: Glean Hess, MD as PCP - General (Internal Medicine) Charolette Forward, MD as Consulting Physician (Cardiology) Telford Nab, RN as Registered Nurse [name], MD as Radiation Oncologist (Radiation Oncology)  REFERRING PROVIDER: Dr. Felicie Morn REASON FOR VISIT:  Follow-up for small cell lung cancer,   HISTORY OF PRESENTING ILLNESS:  Kristi [name] is a  75 y.o.  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  10/20/2018 CT chest with contrast showed large mediastinal mass involving both hilar, left greater than right, consistent with lung carcinoma, The mass causes narrowing of the left mainstem bronchus with resultant volume loss on the left and a mediastinal shift to the left.  Moderate size left pleural effusion. 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  # Initial MRI brain negative.  # Nov 2019- Jan 2020 s/p 4 cycles of Carbo/Etoposide/Tecentriq #  01/24/2019 interim CT scan done which showed continued positive response to therapy with continued reduction in mediastinal adenopathy.  No residual measurable left lung mass. CT findings of acute emphysematous cystitis. Urology did not feel that patient has pyelonephritis and recommend treatment with antibiotics because patient's immunocompromise. Patient finished treatment.   # 11/02/2018-01/09/2019 Chemotherapy carboplatin + Etoposide + tecentriq # Consolidation chest radiation and whole brain radiation finished in May 2020 # 04/25/2019 resume Tecentriq every 3 weeks. # Patient had a fall from her stairs on 01/19/2020. In the emergency room she had CT chest abdomen pelvis CT, CT head without contrast, CT cervical spine without contrast. No CT evidence for acute  intracranial abnormality, degenerative changes of cervical spine..  No CT evidence of acute thoracic abdomen injury.  Severe compression fracture of T7, subacute.  # kyphoplasty procedure on 03/01/2020.  T7 biopsy negative for cancer.  11/20/2020 Surveillance CT scan  Reduced size and prominence of right lateral lung base subpleural nodular density. No findings of active malignancy.  MRI brain showed stable post treatment brain. No metastatic disease.    INTERVAL HISTORY Kristi SEVERNS is a 75 y.o. female who has above history reviewed by me today presents for evaluation prior to immunotherapy for treatment of extensive small cell lung cancer. Patient is on immunotherapy maintenance. Patient reports occasional nausea after drinking fluid She also feels off balance after getting up in the morning, sometimes also along with other position changes. No vomiting diarrhea, fever or chills. ..  Review of Systems  Constitutional: Positive for fatigue. Negative for appetite change, chills and fever.  HENT:   Negative for hearing loss and voice change.   Eyes: Negative for eye problems.  Respiratory: Negative for chest tightness, cough and shortness of breath.   Cardiovascular: Negative for chest pain.  Gastrointestinal: Negative for abdominal distention, abdominal pain, blood in stool, diarrhea and nausea.  Endocrine: Negative for hot flashes.  Genitourinary: Negative for difficulty urinating and frequency.   Musculoskeletal: Negative for arthralgias and back pain.  Skin: Negative for itching and rash.  Neurological: Negative for extremity weakness and headaches.  Hematological: Negative for adenopathy.  Psychiatric/Behavioral: Negative for confusion and depression. The patient is not nervous/anxious.        Forgetful     MEDICAL HISTORY:  Past Medical History:  Diagnosis Date  . Acute MI, inferoposterior wall ([place]) 09/30/2014  . Claustrophobia   . Coronary artery disease   . GERD  (  gastroesophageal reflux disease)   . Hypercholesteremia   . Hypertension   . MI, old   . Pneumonia   . Small cell lung cancer ([place])   . 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 [facility]) 10/27/2018    SURGICAL HISTORY: Past Surgical History:  Procedure Laterality Date  . APPENDECTOMY     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  . BLADDER NECK SUSPENSION    . CHOLECYSTECTOMY N/A 08/14/2018   Procedure: LAPAROSCOPIC CHOLECYSTECTOMY;  Surgeon: Jules Husbands, MD;  Location: [facility] ORS;  Service: General;  Laterality: N/A;  . CHOLECYSTECTOMY  11/2018  . CORONARY ANGIOPLASTY  09/29/2014  . CORONARY STENT PLACEMENT    . ENDOBRONCHIAL ULTRASOUND N/A 10/21/2018   Procedure: ENDOBRONCHIAL ULTRASOUND;  Surgeon: Laverle Hobby, MD;  Location: [facility] ORS;  Service: Pulmonary;  Laterality: N/A;  . HERNIA REPAIR    . IR FLUORO GUIDE CV LINE RIGHT  12/19/2018  . KYPHOPLASTY N/A 03/01/2020   Procedure: T7 KYPHOPLASTY;  Surgeon: Hessie Knows, MD;  Location: [facility] ORS;  Service: Orthopedics;  Laterality: N/A;  . LEFT HEART CATHETERIZATION WITH CORONARY ANGIOGRAM N/A 09/29/2014   Procedure: LEFT HEART CATHETERIZATION WITH CORONARY ANGIOGRAM;  Surgeon: Clent Demark, MD;  Location: [place] CATH LAB;  Service: Cardiovascular;  Laterality: N/A;  . PORTA CATH INSERTION N/A 01/02/2019   Procedure: PORTA CATH INSERTION;  Surgeon: Algernon Huxley, MD;  Location: [place] CV LAB;  Service: Cardiovascular;  Laterality: N/A;  . PORTACATH PLACEMENT Right 10/28/2018   Procedure: INSERTION PORT-A-CATH;  Surgeon: Jules Husbands, MD;  Location: [facility] ORS;  Service: General;  Laterality: Right;    SOCIAL HISTORY: Social History   Socioeconomic History  . Marital status: Married    Spouse name: [name]   . Number of children: 3  . Years of education: Not on file  . Highest education level: Not on file  Occupational History  . Occupation: Retired    Comment: Chief Technology Officer   Tobacco Use  . Smoking status: Former Smoker    Packs/day: 1.00     Years: 39.00    Pack years: 39.00    Types: Cigarettes    Start date: 12/21/1978    Quit date: 10/16/2018    Years since quitting: 2.2  . Smokeless tobacco: Never Used  Vaping Use  . Vaping Use: Never used  Substance and Sexual Activity  . Alcohol use: Yes    Alcohol/week: 0.0 standard drinks    Comment: occassional - approx 1 every 2 weeks   . Drug use: No  . Sexual activity: Yes    Birth control/protection: None  Other Topics Concern  . Not on file  Social History Narrative  . Not on file   Social Determinants of Health   Financial Resource Strain: Not on file  Food Insecurity: Not on file  Transportation Needs: Not on file  Physical Activity: Not on file  Stress: Not on file  Social Connections: Not on file  Intimate Partner Violence: Not on file    FAMILY HISTORY: Family History  Problem Relation Age of Onset  . Alzheimer's disease Mother   . Colon cancer Father   . Breast cancer Neg Hx     ALLERGIES:  has No Known Allergies.  MEDICATIONS:  Current Outpatient Medications  Medication Sig Dispense Refill  . acetaminophen (TYLENOL) 500 MG tablet Take 500 mg by mouth 2 (two) times daily as needed for moderate pain or headache.    Huey Bienenstock (TECENTRIQ IV) Inject 1 Dose into the vein  every 30 (thirty) days.    . Cholecalciferol (DIALYVITE VITAMIN D 5000) 125 MCG (5000 UT) capsule Take 5,000 Units by mouth daily.    . diphenhydrAMINE-zinc acetate (BENADRYL EXTRA STRENGTH) cream Apply 1 application topically 3 (three) times daily as needed for itching. 28.4 g 0  . HYDROcodone-acetaminophen (NORCO/VICODIN) 5-325 MG tablet Take 1 tablet by mouth every 6 (six) hours as needed for moderate pain. 90 tablet 0  . lidocaine-prilocaine (EMLA) cream Apply 1 application topically daily as needed (port access). 30 g 2  . loperamide (IMODIUM A-D) 2 MG tablet Take 2 mg by mouth 4 (four) times daily as needed for diarrhea or loose stools.     [name] Kitchen omeprazole (PRILOSEC) 40 MG  capsule Take 1 capsule (40 mg total) by mouth daily. 90 capsule 1  . senna (SENOKOT) 8.6 MG TABS tablet Take 2 tablets (17.2 mg total) by mouth daily. 120 tablet 0  . sertraline (ZOLOFT) 50 MG tablet Take 1.5 tablets (75 mg total) by mouth daily. 45 tablet 6  . vitamin B-12 (CYANOCOBALAMIN) 1000 MCG tablet Take 1,000 mcg by mouth daily.    . Zoledronic Acid (ZOMETA IV) Inject 1 Dose into the vein every 30 (thirty) days.    . nitroGLYCERIN (NITROSTAT) 0.4 MG SL tablet Place 1 tablet (0.4 mg total) under the tongue every 5 (five) minutes x 3 doses as needed for chest pain. (Patient not taking: No sig reported) 25 tablet 12  . polyethylene glycol (MIRALAX / GLYCOLAX) packet Take 17 g by mouth daily as needed for mild constipation.  (Patient not taking: No sig reported)     No current facility-administered medications for this visit.   Facility-Administered Medications Ordered in Other Visits  Medication Dose Route Frequency Provider Last Rate Last Admin  . heparin lock flush 100 unit/mL  500 Units Intravenous Once Earlie Server, MD      . heparin lock flush 100 unit/mL  500 Units Intravenous Once Earlie Server, MD      . heparin lock flush 100 unit/mL  500 Units Intracatheter Once PRN Earlie Server, MD      . sodium chloride flush (NS) 0.9 % injection 10 mL  10 mL Intravenous PRN Earlie Server, MD   10 mL at 01/09/19 0820  . sodium chloride flush (NS) 0.9 % injection 10 mL  10 mL Intravenous PRN Earlie Server, MD   10 mL at 01/10/19 1400     PHYSICAL EXAMINATION: ECOG PERFORMANCE STATUS: 1 - Symptomatic but completely ambulatory Vitals:   12/30/20 0913  BP: 104/67  Pulse: 66  Resp: 16  Temp: 98.5 F (36.9 C)   Filed Weights   12/30/20 0913  Weight: 142 lb 6.4 oz (64.6 kg)   Physical Exam Constitutional:      General: She is not in acute distress.    Appearance: She is not diaphoretic.     Comments: Patient sits in the wheelchair.  HENT:     Head: Normocephalic and atraumatic.     Nose: Nose normal.      Mouth/Throat:     Pharynx: No oropharyngeal exudate.  Eyes:     General: No scleral icterus.    Pupils: Pupils are equal, round, and reactive to light.  Cardiovascular:     Rate and Rhythm: Normal rate and regular rhythm.     Heart sounds: No murmur heard.   Pulmonary:     Effort: Pulmonary effort is normal. No respiratory distress.     Breath sounds: No wheezing or rales.  Comments: Decreased breath sounds bilaterally.  Abdominal:     General: There is no distension.     Palpations: Abdomen is soft.     Tenderness: There is no abdominal tenderness.  Musculoskeletal:        General: Normal range of motion.     Cervical back: Normal range of motion and neck supple.  Skin:    General: Skin is warm and dry.     Findings: No erythema.  Neurological:     Mental Status: She is alert and oriented to person, place, and time.     Cranial Nerves: No cranial nerve deficit.     Motor: No abnormal muscle tone.     Coordination: Coordination normal.  Psychiatric:        Mood and Affect: Affect normal.        LABORATORY DATA:  I have reviewed the data as listed Lab Results  Component Value Date   WBC 5.3 12/30/2020   HGB 12.0 12/30/2020   HCT 35.6 (L) 12/30/2020   MCV 89.0 12/30/2020   PLT 168 12/30/2020   Recent Labs    08/12/20 1315 09/02/20 1358 09/23/20 0845 10/14/20 0839 11/25/20 0918 12/05/20 0830 12/30/20 0834  NA 134* 137 138   < > 134* 133* 135  K 4.0 3.5 4.0   < > 3.9 3.9 3.8  CL 104 105 106   < > 99 100 102  CO2 23 24 24    < > 25 21* 22  GLUCOSE 98 103* 91   < > 105* 93 111*  BUN 13 18 14    < > 13 21 14   CREATININE 0.81 0.68 0.75   < > 0.96 0.96 0.84  CALCIUM 8.8* 8.3* 8.6*   < > 9.2 9.7 9.2  GFRNONAA >60 >60 >60   < > >60 >60 >60  GFRAA >60 >60 >60  --   --   --   --   PROT 7.4 6.8 7.0   < > 7.0 7.7 7.3  ALBUMIN 4.0 3.7 3.8   < > 3.9 4.2 4.2  AST 17 14* 17   < > 22 18 20   ALT 12 11 11    < > 12 12 12   ALKPHOS 49 49 47   < > 47 49 42  BILITOT 0.5  0.5 0.6   < > 0.6 0.6 0.5   < > = values in this interval not displayed.    RADIOGRAPHIC STUDIES: I have personally reviewed the radiological images as listed and agreed with the findings in the report.  MR Brain W Wo Contrast  Result Date: 11/20/2020 CLINICAL DATA:  Small cell lung cancer with prophylactic cranial radiation June 2020. Restaging EXAM: MRI HEAD WITHOUT AND WITH CONTRAST TECHNIQUE: Multiplanar, multiecho pulse sequences of the brain and surrounding structures were obtained without and with intravenous contrast. CONTRAST:  50mL GADAVIST GADOBUTROL 1 MMOL/ML IV SOLN COMPARISON:  04/12/2020 FINDINGS: Brain: No enhancement or swelling to suggest metastatic disease. 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 No interval discrete insult. Milder chronic small vessel changes are seen in the pons. No hemorrhage, hydrocephalus, or collection. Vascular: Normal flow voids. Skull and upper cervical spine: Normal marrow signal. Sinuses/Orbits: Negative IMPRESSION: Stable post treatment brain.  No evidence of metastasis. Electronically Signed   By: Monte Fantasia M.D.   On: 11/20/2020 11:26   CT CHEST ABDOMEN PELVIS W CONTRAST  Result Date: 11/20/2020 CLINICAL DATA:  Small cell  lung cancer follow up EXAM: CT CHEST, ABDOMEN, AND PELVIS WITH CONTRAST TECHNIQUE: Multidetector CT imaging of the chest, abdomen and pelvis was performed following the standard protocol during bolus administration of intravenous contrast. CONTRAST:  161mL OMNIPAQUE IOHEXOL 300 MG/ML  SOLN COMPARISON:  Multiple exams, including 07/25/2020 FINDINGS: CT CHEST FINDINGS Cardiovascular: Right Port-A-Cath tip: Cavoatrial junction. Coronary, aortic arch, and branch vessel atherosclerotic vascular disease. Mediastinum/Nodes: There is contrast medium in the esophagus suggesting dysmotility or reflux. No pathologic thoracic adenopathy is identified.  Lungs/Pleura: Calcified granulomas in the right lung. Reduced size and prominence of right subpleural nodular densities at the right lateral lung base, with the component along the diaphragm essentially resolved and the component along the lateral pleural margin measuring about 0.7 cm in thickness (image 89 series 4), previously 1.0 cm. There is a small calcified granuloma in the left upper lobe. Increased bandlike atelectasis anteriorly in the left lower lobe. Centrilobular emphysema. Musculoskeletal: Deformity related to transverse upper sternal body fracture again observed, with increased bony bridging compared to the 07/25/2020 exam. Prominent wedge compression fracture at T7 with vertebral augmentation common not appreciably changed. Inferior endplate compression fracture at T4 and T8, unchanged. CT ABDOMEN PELVIS FINDINGS Hepatobiliary: Cholecystectomy.  Otherwise unremarkable. Pancreas: Unremarkable Spleen: Punctate calcification compatible with old granulomatous disease. Adrenals/Urinary Tract: At least partially duplicated right renal collecting system. Pelvic floor laxity with cystocele extending 2.2 cm below the pubococcygeal line. Stomach/Bowel: Supraumbilical and umbilical hernias each containing loops of small bowel. More cephalad there is a ventral hernia mesh. No findings of strangulation or obstruction. Mildly low position of the anorectal junction due to pelvic floor laxity. Scattered colonic diverticula. Borderline wall thickening and possible accentuated mucosal enhancement in the nondistended descending and sigmoid colon, cannot exclude a mild distal colitis. Vascular/Lymphatic: Aortoiliac atherosclerotic vascular disease. Reproductive: Unremarkable Other: No supplemental non-categorized findings. Musculoskeletal: Ventral hernias as noted under the stomach/bowel section above. Grade 1 degenerative anterolisthesis at L4-5 with mild right foraminal stenosis at this level due to disc uncovering  facet spurring. IMPRESSION: 1. Reduced size and prominence of right lateral lung base subpleural nodular densities, with the component along the diaphragm essentially resolved. No findings of active malignancy. 2. Borderline wall thickening and possible accentuated mucosal enhancement in the nondistended descending and sigmoid colon, cannot exclude a mild distal colitis. 3. Other imaging findings of potential clinical significance: Coronary, aortic arch, and branch vessel atherosclerotic vascular disease. At least partially duplicated right renal collecting system. Pelvic floor laxity with cystocele extending 2.2 cm below the pubococcygeal line. Supraumbilical and umbilical hernias each containing loops of small bowel. Compression fractures at T4, T8, T7, and T8 as before. The sternal fracture shows increased bony bridging compared to the 07/25/2020 exam. Grade 1 degenerative anterolisthesis at L4-5 with mild right foraminal stenosis at this level due to disc uncovering facet spurring. 4. Emphysema and aortic atherosclerosis. Aortic Atherosclerosis (ICD10-I70.0) and Emphysema (ICD10-J43.9). Electronically Signed   By: Van Clines M.D.   On: 11/20/2020 12:27     ASSESSMENT & PLAN:  1. Small cell lung cancer ([place])   2. Encounter for antineoplastic immunotherapy   3. Osteopenia, unspecified location   4. 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   Status post chest and whole brain radiation.  Currently on Tecentriq maintenance. Labs are reviewed and discussed with patient. Okay to proceed Tecentriq.  # Diarrhea, resolved # History bone metastasis, #Osteopenia she will proceed with  zometa every 4-6 weeks.  Zometa at next visit. Continue calcium and vitamin D. # COPD/emphysema, her cough symptoms are worse, no greenish or yellowish sputum. Monitor.  #Dizziness with changing position.  Advised patient to  increase oral hydration and change positions slowly. Follow up in 3weeks for eval with next cycle of tecentriq    Earlie Server, MD, PhD

## 2020-12-30 NOTE — Progress Notes (Signed)
Patient reports increase in fatigue, feeling off balance, and also occasional nausea/vomiting a clear phlegm after drinking.

## 2020-12-30 NOTE — Progress Notes (Signed)
Pt tolerated infusion well. Pt stable at discharge. 

## 2021-01-09 ENCOUNTER — Telehealth: Payer: Self-pay | Admitting: Internal Medicine

## 2021-01-09 NOTE — Telephone Encounter (Signed)
Left message for patient to call back and schedule Medicare Annual Wellness Visit (AWV) either virtually or in office. Whichever the patients preference is.  Last AWV 09/19/18; please schedule at anytime with [facility] Health Advisor.  This should be a 40 minute visit.

## 2021-01-15 ENCOUNTER — Ambulatory Visit: Payer: Self-pay | Admitting: Internal Medicine

## 2021-01-20 ENCOUNTER — Inpatient Hospital Stay: Payer: Medicare Other

## 2021-01-20 ENCOUNTER — Inpatient Hospital Stay (HOSPITAL_BASED_OUTPATIENT_CLINIC_OR_DEPARTMENT_OTHER): Payer: Medicare Other | Admitting: Oncology

## 2021-01-20 ENCOUNTER — Encounter: Payer: Self-pay | Admitting: Oncology

## 2021-01-20 VITALS — BP 119/82 | HR 75 | Temp 96.1°F | Resp 18 | Wt 147.5 lb

## 2021-01-20 DIAGNOSIS — Z79899 Other long term (current) drug therapy: Secondary | ICD-10-CM | POA: Diagnosis not present

## 2021-01-20 DIAGNOSIS — Z87891 Personal history of nicotine dependence: Secondary | ICD-10-CM | POA: Diagnosis not present

## 2021-01-20 DIAGNOSIS — Z8589 Personal history of malignant neoplasm of other organs and systems: Secondary | ICD-10-CM | POA: Diagnosis not present

## 2021-01-20 DIAGNOSIS — C3490 Malignant neoplasm of unspecified part of unspecified bronchus or lung: Secondary | ICD-10-CM | POA: Diagnosis not present

## 2021-01-20 DIAGNOSIS — Z5112 Encounter for antineoplastic immunotherapy: Secondary | ICD-10-CM | POA: Diagnosis not present

## 2021-01-20 DIAGNOSIS — E860 Dehydration: Secondary | ICD-10-CM

## 2021-01-20 LAB — CBC WITH DIFFERENTIAL/PLATELET
Abs Immature Granulocytes: 0.03 10*3/uL (ref 0.00–0.07)
Basophils Absolute: 0 10*3/uL (ref 0.0–0.1)
Basophils Relative: 1 %
Eosinophils Absolute: 0.2 10*3/uL (ref 0.0–0.5)
Eosinophils Relative: 4 %
HCT: 34.1 % — ABNORMAL LOW (ref 36.0–46.0)
Hemoglobin: 11.9 g/dL — ABNORMAL LOW (ref 12.0–15.0)
Immature Granulocytes: 1 %
Lymphocytes Relative: 19 %
Lymphs Abs: 1.1 10*3/uL (ref 0.7–4.0)
MCH: 31.2 pg (ref 26.0–34.0)
MCHC: 34.9 g/dL (ref 30.0–36.0)
MCV: 89.5 fL (ref 80.0–100.0)
Monocytes Absolute: 0.9 10*3/uL (ref 0.1–1.0)
Monocytes Relative: 15 %
Neutro Abs: 3.6 10*3/uL (ref 1.7–7.7)
Neutrophils Relative %: 60 %
Platelets: 176 10*3/uL (ref 150–400)
RBC: 3.81 MIL/uL — ABNORMAL LOW (ref 3.87–5.11)
RDW: 13.2 % (ref 11.5–15.5)
WBC: 5.9 10*3/uL (ref 4.0–10.5)
nRBC: 0 % (ref 0.0–0.2)

## 2021-01-20 LAB — COMPREHENSIVE METABOLIC PANEL
ALT: 13 U/L (ref 0–44)
AST: 21 U/L (ref 15–41)
Albumin: 3.9 g/dL (ref 3.5–5.0)
Alkaline Phosphatase: 44 U/L (ref 38–126)
Anion gap: 6 (ref 5–15)
BUN: 18 mg/dL (ref 8–23)
CO2: 26 mmol/L (ref 22–32)
Calcium: 9.1 mg/dL (ref 8.9–10.3)
Chloride: 100 mmol/L (ref 98–111)
Creatinine, Ser: 0.81 mg/dL (ref 0.44–1.00)
GFR, Estimated: >60 mL/min (ref >60)
Glucose, Bld: 103 mg/dL — ABNORMAL HIGH (ref 70–99)
Potassium: 3.9 mmol/L (ref 3.5–5.1)
Sodium: 132 mmol/L — ABNORMAL LOW (ref 135–145)
Total Bilirubin: 0.3 mg/dL (ref 0.3–1.2)
Total Protein: 7.3 g/dL (ref 6.5–8.1)

## 2021-01-20 MED ORDER — HEPARIN SOD (PORK) LOCK FLUSH 100 UNIT/ML IV SOLN
500.0000 [IU] | Freq: Once | INTRAVENOUS | Status: AC | PRN
  Administered 2021-01-20: 500 [IU]
  Filled 2021-01-20: qty 5

## 2021-01-20 MED ORDER — ZOLEDRONIC ACID 4 MG/100ML IV SOLN
4.0000 mg | Freq: Once | INTRAVENOUS | Status: AC
  Administered 2021-01-20: 4 mg via INTRAVENOUS
  Filled 2021-01-20: qty 100

## 2021-01-20 MED ORDER — SODIUM CHLORIDE 0.9 % IV SOLN
Freq: Once | INTRAVENOUS | Status: AC
  Filled 2021-01-20: qty 250

## 2021-01-20 MED ORDER — SODIUM CHLORIDE 0.9% FLUSH
10.0000 mL | Freq: Once | INTRAVENOUS | Status: AC
  Administered 2021-01-20: 10 mL via INTRAVENOUS
  Filled 2021-01-20: qty 10

## 2021-01-20 MED ORDER — DEXAMETHASONE SODIUM PHOSPHATE 10 MG/ML IJ SOLN
10.0000 mg | Freq: Once | INTRAMUSCULAR | Status: AC
  Administered 2021-01-20: 10 mg via INTRAVENOUS
  Filled 2021-01-20: qty 1

## 2021-01-20 MED ORDER — SODIUM CHLORIDE 0.9 % IV SOLN
1200.0000 mg | Freq: Once | INTRAVENOUS | Status: AC
  Administered 2021-01-20: 1200 mg via INTRAVENOUS
  Filled 2021-01-20: qty 20

## 2021-01-20 NOTE — Progress Notes (Signed)
Hematology/Oncology Follow up note [facility] [phone] [phone]   Patient Care Team: Glean Hess, MD as PCP - General (Internal Medicine) Charolette Forward, MD as Consulting Physician (Cardiology) Telford Nab, RN as Registered Nurse [name], MD as Radiation Oncologist (Radiation Oncology)  REFERRING PROVIDER: Dr. Felicie Morn REASON FOR VISIT:  Follow-up for small cell lung cancer,   HISTORY OF PRESENTING ILLNESS:  Kristi [name] is a  75 y.o.  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  10/20/2018 CT chest with contrast showed large mediastinal mass involving both hilar, left greater than right, consistent with lung carcinoma, The mass causes narrowing of the left mainstem bronchus with resultant volume loss on the left and a mediastinal shift to the left.  Moderate size left pleural effusion. 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  # Initial MRI brain negative.  # Nov 2019- Jan 2020 s/p 4 cycles of Carbo/Etoposide/Tecentriq #  01/24/2019 interim CT scan done which showed continued positive response to therapy with continued reduction in mediastinal adenopathy.  No residual measurable left lung mass. CT findings of acute emphysematous cystitis. Urology did not feel that patient has pyelonephritis and recommend treatment with antibiotics because patient's immunocompromise. Patient finished treatment.   # 11/02/2018-01/09/2019 Chemotherapy carboplatin + Etoposide + tecentriq # Consolidation chest radiation and whole brain radiation finished in May 2020 # 04/25/2019 resume Tecentriq every 3 weeks. # Patient had a fall from her stairs on 01/19/2020. In the emergency room she had CT chest abdomen pelvis CT, CT head without contrast, CT cervical spine without contrast. No CT evidence for acute  intracranial abnormality, degenerative changes of cervical spine..  No CT evidence of acute thoracic abdomen injury.  Severe compression fracture of T7, subacute.  # kyphoplasty procedure on 03/01/2020.  T7 biopsy negative for cancer.  11/20/2020 Surveillance CT scan  Reduced size and prominence of right lateral lung base subpleural nodular density. No findings of active malignancy.  MRI brain showed stable post treatment brain. No metastatic disease.    INTERVAL HISTORY Kristi [name] is a 75 y.o. female who has above history reviewed by me today presents for evaluation prior to immunotherapy for treatment of extensive small cell lung cancer. Patient is on immunotherapy maintenance. Clinically she is doing well.  No new complaints. Appetite is fair and weight has been .  Review of Systems  Constitutional: Positive for fatigue. Negative for appetite change, chills and fever.  HENT:   Negative for hearing loss and voice change.   Eyes: Negative for eye problems.  Respiratory: Negative for chest tightness, cough and shortness of breath.   Cardiovascular: Negative for chest pain.  Gastrointestinal: Negative for abdominal distention, abdominal pain, blood in stool, diarrhea and nausea.  Endocrine: Negative for hot flashes.  Genitourinary: Negative for difficulty urinating and frequency.   Musculoskeletal: Negative for arthralgias and back pain.  Skin: Negative for itching and rash.  Neurological: Negative for extremity weakness and headaches.  Hematological: Negative for adenopathy.  Psychiatric/Behavioral: Negative for confusion and depression. The patient is not nervous/anxious.        Forgetful     MEDICAL HISTORY:  Past Medical History:  Diagnosis Date  . Acute MI, inferoposterior wall ([place]) 09/30/2014  . Claustrophobia   . Coronary artery disease   . GERD (gastroesophageal reflux disease)   . Hypercholesteremia   . Hypertension   . MI,  old   . Pneumonia   . Small cell lung  cancer ([place])   . 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 [facility]) 10/27/2018    SURGICAL HISTORY: Past Surgical History:  Procedure Laterality Date  . APPENDECTOMY     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  . BLADDER NECK SUSPENSION    . CHOLECYSTECTOMY N/A 08/14/2018   Procedure: LAPAROSCOPIC CHOLECYSTECTOMY;  Surgeon: Jules Husbands, MD;  Location: [facility] ORS;  Service: General;  Laterality: N/A;  . CHOLECYSTECTOMY  11/2018  . CORONARY ANGIOPLASTY  09/29/2014  . CORONARY STENT PLACEMENT    . ENDOBRONCHIAL ULTRASOUND N/A 10/21/2018   Procedure: ENDOBRONCHIAL ULTRASOUND;  Surgeon: Laverle Hobby, MD;  Location: [facility] ORS;  Service: Pulmonary;  Laterality: N/A;  . HERNIA REPAIR    . IR FLUORO GUIDE CV LINE RIGHT  12/19/2018  . KYPHOPLASTY N/A 03/01/2020   Procedure: T7 KYPHOPLASTY;  Surgeon: Hessie Knows, MD;  Location: [facility] ORS;  Service: Orthopedics;  Laterality: N/A;  . LEFT HEART CATHETERIZATION WITH CORONARY ANGIOGRAM N/A 09/29/2014   Procedure: LEFT HEART CATHETERIZATION WITH CORONARY ANGIOGRAM;  Surgeon: Clent Demark, MD;  Location: [place] CATH LAB;  Service: Cardiovascular;  Laterality: N/A;  . PORTA CATH INSERTION N/A 01/02/2019   Procedure: PORTA CATH INSERTION;  Surgeon: Algernon Huxley, MD;  Location: [place] CV LAB;  Service: Cardiovascular;  Laterality: N/A;  . PORTACATH PLACEMENT Right 10/28/2018   Procedure: INSERTION PORT-A-CATH;  Surgeon: Jules Husbands, MD;  Location: [facility] ORS;  Service: General;  Laterality: Right;    SOCIAL HISTORY: Social History   Socioeconomic History  . Marital status: Married    Spouse name: [name]   . Number of children: 3  . Years of education: Not on file  . Highest education level: Not on file  Occupational History  . Occupation: Retired    Comment: Chief Technology Officer   Tobacco Use  . Smoking status: Former Smoker    Packs/day: 1.00    Years: 39.00    Pack years: 39.00    Types: Cigarettes    Start date: 12/21/1978    Quit date: 10/16/2018    Years  since quitting: 2.2  . Smokeless tobacco: Never Used  Vaping Use  . Vaping Use: Never used  Substance and Sexual Activity  . Alcohol use: Yes    Alcohol/week: 0.0 standard drinks    Comment: occassional - approx 1 every 2 weeks   . Drug use: No  . Sexual activity: Yes    Birth control/protection: None  Other Topics Concern  . Not on file  Social History Narrative  . Not on file   Social Determinants of Health   Financial Resource Strain: Not on file  Food Insecurity: Not on file  Transportation Needs: Not on file  Physical Activity: Not on file  Stress: Not on file  Social Connections: Not on file  Intimate Partner Violence: Not on file    FAMILY HISTORY: Family History  Problem Relation Age of Onset  . Alzheimer's disease Mother   . Colon cancer Father   . Breast cancer Neg Hx     ALLERGIES:  has No Known Allergies.  MEDICATIONS:  Current Outpatient Medications  Medication Sig Dispense Refill  . acetaminophen (TYLENOL) 500 MG tablet Take 500 mg by mouth 2 (two) times daily as needed for moderate pain or headache.    Huey Bienenstock (TECENTRIQ IV) Inject 1 Dose into the vein every 30 (thirty) days.    . Cholecalciferol (DIALYVITE VITAMIN D 5000) 125  MCG (5000 UT) capsule Take 5,000 Units by mouth daily.    . diphenhydrAMINE-zinc acetate (BENADRYL EXTRA STRENGTH) cream Apply 1 application topically 3 (three) times daily as needed for itching. 28.4 g 0  . HYDROcodone-acetaminophen (NORCO/VICODIN) 5-325 MG tablet Take 1 tablet by mouth every 6 (six) hours as needed for moderate pain. 90 tablet 0  . lidocaine-prilocaine (EMLA) cream Apply 1 application topically daily as needed (port access). 30 g 2  . loperamide (IMODIUM A-D) 2 MG tablet Take 2 mg by mouth 4 (four) times daily as needed for diarrhea or loose stools.     [name] Kitchen omeprazole (PRILOSEC) 40 MG capsule Take 1 capsule (40 mg total) by mouth daily. 90 capsule 1  . polyethylene glycol (MIRALAX / GLYCOLAX) packet Take 17  g by mouth daily as needed for mild constipation.    . senna (SENOKOT) 8.6 MG TABS tablet Take 2 tablets (17.2 mg total) by mouth daily. 120 tablet 0  . sertraline (ZOLOFT) 50 MG tablet Take 1.5 tablets (75 mg total) by mouth daily. 45 tablet 6  . vitamin B-12 (CYANOCOBALAMIN) 1000 MCG tablet Take 1,000 mcg by mouth daily.    . Zoledronic Acid (ZOMETA IV) Inject 1 Dose into the vein every 30 (thirty) days.    . nitroGLYCERIN (NITROSTAT) 0.4 MG SL tablet Place 1 tablet (0.4 mg total) under the tongue every 5 (five) minutes x 3 doses as needed for chest pain. (Patient not taking: No sig reported) 25 tablet 12   No current facility-administered medications for this visit.   Facility-Administered Medications Ordered in Other Visits  Medication Dose Route Frequency Provider Last Rate Last Admin  . heparin lock flush 100 unit/mL  500 Units Intravenous Once Earlie Server, MD      . heparin lock flush 100 unit/mL  500 Units Intravenous Once Earlie Server, MD      . sodium chloride flush (NS) 0.9 % injection 10 mL  10 mL Intravenous PRN Earlie Server, MD   10 mL at 01/09/19 0820  . sodium chloride flush (NS) 0.9 % injection 10 mL  10 mL Intravenous PRN Earlie Server, MD   10 mL at 01/10/19 1400     PHYSICAL EXAMINATION: ECOG PERFORMANCE STATUS: 1 - Symptomatic but completely ambulatory Vitals:   01/20/21 0935  BP: 119/82  Pulse: 75  Resp: 18  Temp: (!) 96.1 F (35.6 C)  SpO2: 100%   Filed Weights   01/20/21 0935  Weight: 147 lb 8 oz (66.9 kg)   Physical Examination Today's Vitals   01/20/21 0935  BP: 119/82  Pulse: 75  Resp: 18  Temp: (!) 96.1 F (35.6 C)  SpO2: 100%  Weight: 147 lb 8 oz (66.9 kg)  PainSc: 0-No pain   Body mass index is 23.1 kg/m.   Physical Exam Constitutional:      General: She is not in acute distress.    Appearance: She is not diaphoretic.     Comments: Patient sits in the wheelchair.  HENT:     Head: Normocephalic and atraumatic.     Nose: Nose normal.      Mouth/Throat:     Pharynx: No oropharyngeal exudate.  Eyes:     General: No scleral icterus.    Pupils: Pupils are equal, round, and reactive to light.  Cardiovascular:     Rate and Rhythm: Normal rate and regular rhythm.     Heart sounds: No murmur heard.   Pulmonary:     Effort: Pulmonary effort is normal. No  respiratory distress.     Breath sounds: No wheezing or rales.     Comments: Decreased breath sounds bilaterally.  Abdominal:     General: There is no distension.     Palpations: Abdomen is soft.     Tenderness: There is no abdominal tenderness.  Musculoskeletal:        General: Normal range of motion.     Cervical back: Normal range of motion and neck supple.  Skin:    General: Skin is warm and dry.     Findings: No erythema.  Neurological:     Mental Status: She is alert and oriented to person, place, and time.     Cranial Nerves: No cranial nerve deficit.     Motor: No abnormal muscle tone.     Coordination: Coordination normal.  Psychiatric:        Mood and Affect: Affect normal.        LABORATORY DATA:  I have reviewed the data as listed Lab Results  Component Value Date   WBC 5.9 01/20/2021   HGB 11.9 (L) 01/20/2021   HCT 34.1 (L) 01/20/2021   MCV 89.5 01/20/2021   PLT 176 01/20/2021   Recent Labs    08/12/20 1315 09/02/20 1358 09/23/20 0845 10/14/20 0839 12/05/20 0830 12/30/20 0834 01/20/21 0857  NA 134* 137 138   < > 133* 135 132*  K 4.0 3.5 4.0   < > 3.9 3.8 3.9  CL 104 105 106   < > 100 102 100  CO2 23 24 24    < > 21* 22 26  GLUCOSE 98 103* 91   < > 93 111* 103*  BUN 13 18 14    < > 21 14 18   CREATININE 0.81 0.68 0.75   < > 0.96 0.84 0.81  CALCIUM 8.8* 8.3* 8.6*   < > 9.7 9.2 9.1  GFRNONAA >60 >60 >60   < > >60 >60 >60  GFRAA >60 >60 >60  --   --   --   --   PROT 7.4 6.8 7.0   < > 7.7 7.3 7.3  ALBUMIN 4.0 3.7 3.8   < > 4.2 4.2 3.9  AST 17 14* 17   < > 18 20 21   ALT 12 11 11    < > 12 12 13   ALKPHOS 49 49 47   < > 49 42 44  BILITOT  0.5 0.5 0.6   < > 0.6 0.5 0.3   < > = values in this interval not displayed.    RADIOGRAPHIC STUDIES: I have personally reviewed the radiological images as listed and agreed with the findings in the report.  MR Brain W Wo Contrast  Result Date: 11/20/2020 CLINICAL DATA:  Small cell lung cancer with prophylactic cranial radiation June 2020. Restaging EXAM: MRI HEAD WITHOUT AND WITH CONTRAST TECHNIQUE: Multiplanar, multiecho pulse sequences of the brain and surrounding structures were obtained without and with intravenous contrast. CONTRAST:  54mL GADAVIST GADOBUTROL 1 MMOL/ML IV SOLN COMPARISON:  04/12/2020 FINDINGS: Brain: No enhancement or swelling to suggest metastatic disease. 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 No interval discrete insult. Milder chronic small vessel changes are seen in the pons. No hemorrhage, hydrocephalus, or collection. Vascular: Normal flow voids. Skull and upper cervical spine: Normal marrow signal. Sinuses/Orbits: Negative IMPRESSION: Stable post treatment brain.  No evidence of metastasis. Electronically Signed   By: Monte Fantasia M.D.   On: 11/20/2020 11:26  CT CHEST ABDOMEN PELVIS W CONTRAST  Result Date: 11/20/2020 CLINICAL DATA:  Small cell lung cancer follow up EXAM: CT CHEST, ABDOMEN, AND PELVIS WITH CONTRAST TECHNIQUE: Multidetector CT imaging of the chest, abdomen and pelvis was performed following the standard protocol during bolus administration of intravenous contrast. CONTRAST:  173mL OMNIPAQUE IOHEXOL 300 MG/ML  SOLN COMPARISON:  Multiple exams, including 07/25/2020 FINDINGS: CT CHEST FINDINGS Cardiovascular: Right Port-A-Cath tip: Cavoatrial junction. Coronary, aortic arch, and branch vessel atherosclerotic vascular disease. Mediastinum/Nodes: There is contrast medium in the esophagus suggesting dysmotility or reflux. No pathologic thoracic adenopathy is  identified. Lungs/Pleura: Calcified granulomas in the right lung. Reduced size and prominence of right subpleural nodular densities at the right lateral lung base, with the component along the diaphragm essentially resolved and the component along the lateral pleural margin measuring about 0.7 cm in thickness (image 89 series 4), previously 1.0 cm. There is a small calcified granuloma in the left upper lobe. Increased bandlike atelectasis anteriorly in the left lower lobe. Centrilobular emphysema. Musculoskeletal: Deformity related to transverse upper sternal body fracture again observed, with increased bony bridging compared to the 07/25/2020 exam. Prominent wedge compression fracture at T7 with vertebral augmentation common not appreciably changed. Inferior endplate compression fracture at T4 and T8, unchanged. CT ABDOMEN PELVIS FINDINGS Hepatobiliary: Cholecystectomy.  Otherwise unremarkable. Pancreas: Unremarkable Spleen: Punctate calcification compatible with old granulomatous disease. Adrenals/Urinary Tract: At least partially duplicated right renal collecting system. Pelvic floor laxity with cystocele extending 2.2 cm below the pubococcygeal line. Stomach/Bowel: Supraumbilical and umbilical hernias each containing loops of small bowel. More cephalad there is a ventral hernia mesh. No findings of strangulation or obstruction. Mildly low position of the anorectal junction due to pelvic floor laxity. Scattered colonic diverticula. Borderline wall thickening and possible accentuated mucosal enhancement in the nondistended descending and sigmoid colon, cannot exclude a mild distal colitis. Vascular/Lymphatic: Aortoiliac atherosclerotic vascular disease. Reproductive: Unremarkable Other: No supplemental non-categorized findings. Musculoskeletal: Ventral hernias as noted under the stomach/bowel section above. Grade 1 degenerative anterolisthesis at L4-5 with mild right foraminal stenosis at this level due to disc  uncovering facet spurring. IMPRESSION: 1. Reduced size and prominence of right lateral lung base subpleural nodular densities, with the component along the diaphragm essentially resolved. No findings of active malignancy. 2. Borderline wall thickening and possible accentuated mucosal enhancement in the nondistended descending and sigmoid colon, cannot exclude a mild distal colitis. 3. Other imaging findings of potential clinical significance: Coronary, aortic arch, and branch vessel atherosclerotic vascular disease. At least partially duplicated right renal collecting system. Pelvic floor laxity with cystocele extending 2.2 cm below the pubococcygeal line. Supraumbilical and umbilical hernias each containing loops of small bowel. Compression fractures at T4, T8, T7, and T8 as before. The sternal fracture shows increased bony bridging compared to the 07/25/2020 exam. Grade 1 degenerative anterolisthesis at L4-5 with mild right foraminal stenosis at this level due to disc uncovering facet spurring. 4. Emphysema and aortic atherosclerosis. Aortic Atherosclerosis (ICD10-I70.0) and Emphysema (ICD10-J43.9). Electronically Signed   By: Van Clines M.D.   On: 11/20/2020 12:27     ASSESSMENT & PLAN:  1. Small cell lung cancer ([place])   2. Encounter for antineoplastic immunotherapy   3. 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   Currently on Tecentriq maintenance. Labs are reviewed and discussed with patient. Ok to proceed with Tecentriq.   # History bone metastasis, #Osteopenia she will proceed with  zometa every 4-6 weeks.  Zometa at next visit. Continue calcium and vitamin D.  Proceed with Zometa today.   # COPD/emphysema, Stable. Monitor.  Follow up in 3weeks for eval with next cycle of tecentriq    Earlie Server, MD, PhD

## 2021-01-20 NOTE — Progress Notes (Signed)
Pt tolerated infusion well. Pt stable at discharge. 

## 2021-01-20 NOTE — Progress Notes (Signed)
Patient here for follow up. Pt reports having occasional heartburn

## 2021-02-04 DIAGNOSIS — H903 Sensorineural hearing loss, bilateral: Secondary | ICD-10-CM | POA: Diagnosis not present

## 2021-02-04 DIAGNOSIS — H6123 Impacted cerumen, bilateral: Secondary | ICD-10-CM | POA: Diagnosis not present

## 2021-02-05 ENCOUNTER — Ambulatory Visit: Payer: Medicare Other | Admitting: Surgery

## 2021-02-07 ENCOUNTER — Other Ambulatory Visit: Payer: Self-pay

## 2021-02-07 ENCOUNTER — Encounter: Payer: Self-pay | Admitting: Surgery

## 2021-02-07 ENCOUNTER — Ambulatory Visit (INDEPENDENT_AMBULATORY_CARE_PROVIDER_SITE_OTHER): Payer: Medicare Other | Admitting: Surgery

## 2021-02-07 VITALS — BP 116/78 | HR 95 | Temp 98.0°F | Ht 66.0 in | Wt 145.0 lb

## 2021-02-07 DIAGNOSIS — K432 Incisional hernia without obstruction or gangrene: Secondary | ICD-10-CM

## 2021-02-07 NOTE — Progress Notes (Signed)
Request for Medical Clearance has been faxed to Dr Gaspar Cola office.  Request for Oncology Clearance has been faxed to Dr Collie Siad office.

## 2021-02-07 NOTE — Patient Instructions (Addendum)
We will request a surgical clearance from your primary care provider and also Dr Tasia Catchings. We will have you follow up here to speak about surgery after we get those clearances.   Ventral Hernia A ventral hernia (also called an incisional hernia) is a hernia that occurs at the site of a previous surgical cut (incision) in the abdomen. The abdominal wall spans from your lower chest down to your pelvis. If the abdominal wall is weakened from a surgical incision, a hernia can occur. A hernia is a bulge of bowel or muscle tissue pushing out on the weakened part of the abdominal wall. Ventral hernias can get bigger from straining or lifting. Obese and older people are at higher risk for a ventral hernia. People who develop infections after surgery or require repeat incisions at the same site on the abdomen are also at increased risk. CAUSES  A ventral hernia occurs because of weakness in the abdominal wall at an incision site.  SYMPTOMS  Common symptoms include:  A visible bulge or lump on the abdominal wall.  Pain or tenderness around the lump.  Increased discomfort if you cough or make a sudden movement. If the hernia has blocked part of the intestine, a serious complication can occur (incarcerated or strangulated hernia). This can become a problem that requires emergency surgery because the blood flow to the blocked intestine may be cut off. Symptoms may include:  Feeling sick to your stomach (nauseous).  Throwing up (vomiting).  Stomach swelling (distention) or bloating.  Fever.  Rapid heartbeat. DIAGNOSIS  Your health care provider will take a medical history and perform a physical exam. Various tests may be ordered, such as:  Blood tests.  Urine tests.  Ultrasonography.  X-rays.  Computed tomography (CT). TREATMENT  Watchful waiting may be all that is needed for a smaller hernia that does not cause symptoms. Your health care provider may recommend the use of a supportive belt (truss)  that helps to keep the abdominal wall intact. For larger hernias or those that cause pain, surgery to repair the hernia is usually recommended. If a hernia becomes strangulated, emergency surgery needs to be done right away. HOME CARE INSTRUCTIONS  Avoid putting pressure or strain on the abdominal area.  Avoid heavy lifting.  Use good body positioning for physical tasks. Ask your health care provider about proper body positioning.  Use a supportive belt as directed by your health care provider.  Maintain a healthy weight.  Eat foods that are high in fiber, such as whole grains, fruits, and vegetables. Fiber helps prevent difficult bowel movements (constipation).  Drink enough fluids to keep your urine clear or pale yellow.  Follow up with your health care provider as directed. SEEK MEDICAL CARE IF:   Your hernia seems to be getting larger or more painful. SEEK IMMEDIATE MEDICAL CARE IF:   You have abdominal pain that is sudden and sharp.  Your pain becomes severe.  You have repeated vomiting.  You are sweating a lot.  You notice a rapid heartbeat.  You develop a fever. MAKE SURE YOU:   Understand these instructions.  Will watch your condition.  Will get help right away if you are not doing well or get worse.   This information is not intended to replace advice given to you by your health care provider. Make sure you discuss any questions you have with your health care provider.   Document Released: 11/23/2012 Document Revised: 12/28/2014 Document Reviewed: 11/23/2012 Elsevier Interactive Patient Education 2016  Else[employer]    Laparoscopic Ventral Hernia Repair Laparoscopic ventral hernia repairis a surgery to fix a ventral hernia. Aventral hernia, also called an incisional hernia, is a bulge of body tissue or intestines that pushes through the front part of the abdomen. This can happen if the connective tissue covering the muscles over the abdomen has a weak spot  or is torn because of a surgical cut (incision) from a previous surgery. Laparoscopic ventral hernia repair is often done soon after diagnosis to stop the hernia from getting bigger, becoming uncomfortable, or becoming an emergency. This surgery usually takes about 2 hours, but the time can vary greatly. LET [facility] CARE PROVIDER KNOW ABOUT:  Any allergies you have.  All medicines you are taking, including steroids, vitamins, herbs, eye drops, creams, and over-the-counter medicines.  Previous problems you or members of your family have had with the use of anesthetics.  Any blood disorders you have.  Previous surgeries you have had.  Medical conditions you have. RISKS AND COMPLICATIONS  Generally, laparoscopic ventral hernia repair is a safe procedure. However, as with any surgical procedure, problems can occur. Possible problems include:  Bleeding.  Trouble passing urine or having a bowel movement after the surgery.  Infection.  Pneumonia.  Blood clots.  Pain in the area of the hernia.  A bulge in the area of the hernia that may be caused by a collection of fluid.  Injury to intestines or other structures in the abdomen.  Return of the hernia after surgery. In some cases, your health care provider may need to stop the laparoscopic procedure and do regular, open surgery. This may be necessary for very difficult hernias, when organs are hard to see, or when bleeding problems occur during surgery. BEFORE THE PROCEDURE   You may need to have blood tests, urine tests, a chest X-ray, or an electrocardiogram done before the day of the surgery.  Ask your health care provider about changing or stopping your regular medicines. This is especially important if you are taking diabetes medicines or blood thinners.  You may need to wash with a special type of germ-killing soap.  Do not eat or drink anything after midnight the night before the procedure or as directed by your health  care provider.  Make plans to have someone drive you home after the procedure. PROCEDURE   Small monitors will be put on your body. They are used to check your heart, blood pressure, and oxygen level.  An IV access tube will be put into a vein in your hand or arm. Fluids and medicine will flow directly into your body through the IV tube.  You will be given medicine that makes you go to sleep (general anesthetic).  Your abdomen will be cleaned with a special soap to kill any germs on your skin.  Once you are asleep, several small incisions will be made in your abdomen.  The large space in your abdomen will be filled with air so that it expands. This gives your health care provider more room and a better view.  A thin, lighted tube with a tiny camera on the end (laparoscope) is put through a small incision in your abdomen. The camera on the laparoscope sends a picture to a TV screen in the operating room. This gives your health care provider a good view inside your abdomen.  Hollow tubes are put through the other small incisions in your abdomen. The tools needed for the procedure are put through these  tubes.  Your health care provider puts the tissue or intestines that formed the hernia back in place.  A screen-like patch (mesh) is used to close the hernia. This helps make the area stronger. Stitches, tacks, or staples are used to keep the mesh in place.  Medicine and a bandage (dressing) or skin glue will be put over the incisions. AFTER THE PROCEDURE   You will stay in a recovery area until the anesthetic wears off. Your blood pressure and pulse will be checked often.  You may be able to go home the same day or may need to stay in the hospital for 1-2 days after surgery. Your health care provider will decide when you can go home.  You may feel some pain. You may be given medicine for pain.  You will be urged to do breathing exercises that involve taking deep breaths. This helps  prevent a lung infection after a surgery.  You may have to wear compression stockings while you are in the hospital. These stockings help keep blood clots from forming in your legs.   This information is not intended to replace advice given to you by your health care provider. Make sure you discuss any questions you have with your health care provider.   Document Released: 11/23/2012 Document Revised: 12/12/2013 Document Reviewed: 11/23/2012 Elsevier Interactive Patient Education [employer].

## 2021-02-09 ENCOUNTER — Encounter: Payer: Self-pay | Admitting: Surgery

## 2021-02-09 NOTE — Progress Notes (Signed)
02/07/2021  Reason for Visit:  Incisional hernia  Referring Provider:  Halina Maidens, MD  History of Present Illness: Kristi [name] is a 75 y.o. female presenting for evaluation of an incisional hernia.  The patient had a prior upper epigastric open ventral hernia repair with mesh many years ago.  She more recently had a laparoscopic cholecystectomy with Dr. Dahlia Byes in 07/2018.  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  She's been doing very well from that standpoint.  However, looking at her follow up CT scans since her chemotherapy started, she's developed an incisional hernia at the umbilicus, which has been progressively growing in size.  At first, the patient reports that she was not given a good prognosis from her cancer standpoint, so she did not think it was worth pursuing repair of the hernia.  However, she's been doing so well from it and with stable follow up imaging that she's seeking repair now.  Although she had surgery last with Dr. Dahlia Byes, her husband is a patient of mine, and she wanted to see me instead.  The patient reports that she gets abdominal discomfort when the hernia is bulging and reports that sometimes it feels hard and sometimes it feels soft.  Denies any nausea or vomiting, constipation or diarrhea.  Her main symptom is the abdominal discomfort.  Her main worry though is that she also notices how it's been getting bigger with the years, and she does not want to reach a point where it's too large to repair.  From the functional standpoint, the patient reports that she's still able to walk, though at times with a bit of unsteady strength.  She walked from the parking lot to our office and exam room without assistance.  Past Medical History: Past Medical History:  Diagnosis Date  . Acute MI, inferoposterior wall  ([place]) 09/30/2014  . Claustrophobia   . Coronary artery disease   . GERD (gastroesophageal reflux disease)   . Hypercholesteremia   . Hypertension   . MI, old   . Pneumonia   . Small cell lung cancer ([place])   . 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 [facility]) 10/27/2018     Past Surgical History: Past Surgical History:  Procedure Laterality Date  . APPENDECTOMY     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  . BLADDER NECK SUSPENSION    . CHOLECYSTECTOMY N/A 08/14/2018   Procedure: LAPAROSCOPIC CHOLECYSTECTOMY;  Surgeon: Jules Husbands, MD;  Location: [facility] ORS;  Service: General;  Laterality: N/A;  . CHOLECYSTECTOMY  11/2018  . CORONARY ANGIOPLASTY  09/29/2014  . CORONARY STENT PLACEMENT    . ENDOBRONCHIAL ULTRASOUND N/A 10/21/2018   Procedure: ENDOBRONCHIAL ULTRASOUND;  Surgeon: Laverle Hobby, MD;  Location: [facility] ORS;  Service: Pulmonary;  Laterality: N/A;  . HERNIA REPAIR  2012  . IR FLUORO GUIDE CV LINE RIGHT  12/19/2018  . KYPHOPLASTY N/A 03/01/2020   Procedure: T7 KYPHOPLASTY;  Surgeon: Hessie Knows, MD;  Location: [facility] ORS;  Service: Orthopedics;  Laterality: N/A;  . LEFT HEART CATHETERIZATION WITH CORONARY ANGIOGRAM N/A 09/29/2014   Procedure: LEFT HEART CATHETERIZATION WITH CORONARY ANGIOGRAM;  Surgeon: Clent Demark, MD;  Location: [place] CATH LAB;  Service: Cardiovascular;  Laterality: N/A;  . PORTA CATH INSERTION N/A 01/02/2019   Procedure: PORTA CATH INSERTION;  Surgeon: Algernon Huxley, MD;  Location: [place] CV LAB;  Service: Cardiovascular;  Laterality: N/A;  . PORTACATH PLACEMENT Right 10/28/2018   Procedure: INSERTION PORT-A-CATH;  Surgeon: Jules Husbands, MD;  Location: [facility] ORS;  Service: General;  Laterality: Right;    Home Medications: Prior to Admission medications   Medication Sig Start Date End Date Taking? Authorizing Provider  acetaminophen (TYLENOL) 500 MG tablet Take 500 mg by mouth 2 (two) times daily as needed for moderate pain or headache.   Yes [provider]   Atezolizumab (TECENTRIQ IV) Inject 1 Dose into the vein every 30 (thirty) days.   Yes [provider]  Cholecalciferol (DIALYVITE VITAMIN D 5000) 125 MCG (5000 UT) capsule Take 5,000 Units by mouth daily.   Yes [provider]  diphenhydrAMINE-zinc acetate (BENADRYL EXTRA STRENGTH) cream Apply 1 application topically 3 (three) times daily as needed for itching. 05/18/19  Yes Earlie Server, MD  HYDROcodone-acetaminophen (NORCO/VICODIN) 5-325 MG tablet Take 1 tablet by mouth every 6 (six) hours as needed for moderate pain. 06/25/20  Yes Borders, Kirt Boys, NP  lidocaine-prilocaine (EMLA) cream Apply 1 application topically daily as needed (port access). 04/15/20  Yes Borders, Kirt Boys, NP  loperamide (IMODIUM A-D) 2 MG tablet Take 2 mg by mouth 4 (four) times daily as needed for diarrhea or loose stools.    Yes [provider]  nitroGLYCERIN (NITROSTAT) 0.4 MG SL tablet Place 1 tablet (0.4 mg total) under the tongue every 5 (five) minutes x 3 doses as needed for chest pain. 10/02/14  Yes Charolette Forward, MD  omeprazole (PRILOSEC) 40 MG capsule Take 1 capsule (40 mg total) by mouth daily. 10/17/20  Yes Earlie Server, MD  polyethylene glycol [facility] Floria Raveling) packet Take 17 g by mouth daily as needed for mild constipation.   Yes [provider]  senna (SENOKOT) 8.6 MG TABS tablet Take 2 tablets (17.2 mg total) by mouth daily. 08/29/20  Yes Earlie Server, MD  sertraline (ZOLOFT) 50 MG tablet Take 1.5 tablets (75 mg total) by mouth daily. 06/25/20  Yes Borders, Kirt Boys, NP  vitamin B-12 (CYANOCOBALAMIN) 1000 MCG tablet Take 1,000 mcg by mouth daily.   Yes [provider]  Zoledronic Acid (ZOMETA IV) Inject 1 Dose into the vein every 30 (thirty) days.   Yes [provider]    Allergies: No Known Allergies  Social History:  reports that she quit smoking about 2 years ago. Her smoking use included cigarettes. She started smoking about 42 years ago. She has a 39.00  pack-year smoking history. She has never used smokeless tobacco. She reports current alcohol use. She reports that she does not use drugs.   Family History: Family History  Problem Relation Age of Onset  . Alzheimer's disease Mother   . Colon cancer Father   . Breast cancer Neg Hx     Review of Systems: Review of Systems  Constitutional: Negative for chills and fever.  Respiratory: Negative for shortness of breath.   Cardiovascular: Negative for chest pain.  Gastrointestinal: Positive for abdominal pain. Negative for constipation, diarrhea, nausea and vomiting.  Genitourinary: Negative for dysuria.  Musculoskeletal: Negative for myalgias.  Skin: Negative for rash.  Neurological: Positive for weakness (weaker legs). Negative for dizziness.  Psychiatric/Behavioral: Negative for depression.    Physical Exam BP 116/78   Pulse 95   Temp 98 F (36.7 C)   Ht 5\' 6"  (1.676 m)   Wt 145 lb (65.8 kg)   SpO2 98%   BMI 23.40 kg/m  CONSTITUTIONAL: No acute distress HEENT:  Normocephalic, atraumatic, extraocular motion intact. NECK: Trachea is midline, and there is no jugular venous distension.  RESPIRATORY:  Lungs are clear, and breath sounds are equal bilaterally. Normal respiratory effort without pathologic use of accessory muscles. CARDIOVASCULAR: Heart is regular without murmurs, gallops, or rubs. GI: The abdomen is soft, non-distended, currently non-tender.  The patient has an easily reducible incisional hernia at the umbilicus.  This is at the most inferior portion of her prior ventral hernia incision, and also at the location of her periumbilical port for the cholecystectomy.  MUSCULOSKELETAL:  Normal muscle strength and tone in all four extremities.  No peripheral edema or cyanosis. SKIN: Skin turgor is normal. There are no pathologic skin lesions.  NEUROLOGIC:  Motor and sensation is grossly normal.  Cranial nerves are grossly intact. PSYCH:  Alert and oriented to person, place  and time. Affect is normal.  Laboratory Analysis: Labs from 01/20/21: Na 132, K 3.9, Cl 100, CO2 26, BUN 18, Cr 0.81, LFTs all within normal.  WBC 5.9, Hgb 11.9, Hct 34.1, Plt 176.  Differential count within normal.  Imaging: CT chest, abdomen, pelvis 11/20/2020: IMPRESSION: 1. Reduced size and prominence of right lateral lung base subpleural nodular densities, with the component along the diaphragm essentially resolved. No findings of active malignancy. 2. Borderline wall thickening and possible accentuated mucosal enhancement in the nondistended descending and sigmoid colon, cannot exclude a mild distal colitis. 3. Other imaging findings of potential clinical significance: Coronary, aortic arch, and branch vessel atherosclerotic vascular disease. At least partially duplicated right renal collecting system. Pelvic floor laxity with cystocele extending 2.2 cm below the pubococcygeal line. Supraumbilical and umbilical hernias each containing loops of small bowel. Compression fractures at T4, T8, T7, and T8 as before. The sternal fracture shows increased bony bridging compared to the 07/25/2020 exam. Grade 1 degenerative anterolisthesis at L4-5 with mild right foraminal stenosis at this level due to disc uncovering facet spurring. 4. Emphysema and aortic atherosclerosis.   MRI Brain 11/20/2020: IMPRESSION: Stable post treatment brain.  No evidence of metastasis.   Assessment and Plan: This is a 75 y.o. female with an incisional hernia.  --Discussed with the patient that the hernia size that she has is borderline between been able to repair primarily vs needing more extensive dissection such as Rives-Stoppa vs TAR repair.  Given her frailty, I do not think she would be able to tolerate such a major open hernia repair, and I am unable to offer her this type of repair with minimally invasive techniques.  The most that I could offer at this point is attempt rTEP vs IPOM hernia repair, with the  concern that I may not be able to bring the fascia fully closed.  I would still place mesh to reinforce the repair.  I offered to refer her to a tertiary / university center for another opinion where they could potentially offer a minimally invasive approach.  She has opted to continue with me, and understands the potential setbacks with the approach I can offer.   For now, given her medical conditions, I would like to obtain preoperative clearance from Dr. Army Melia (her PCP) and from Dr. Tasia Catchings (her oncologist).  Given that she's on maintenance therapy, it would be useful to know if there's a specific timing to surgery based on her dosing.  The patient reports that she's always tired for a few days after each treatment, so perhaps a week after would be best.  Would defer to Dr Tasia Catchings for recommendations on  timing for surgery.    After clearances are obtained, she'll follow up with me to discuss the surgery more at length and schedule a date/time.  She understands this plan and is willing to proceed.  All of her questions have been answered.  Face-to-face time spent with the patient and care providers was 60 minutes, with more than 50% of the time spent counseling, educating, and coordinating care of the patient.     Melvyn Neth, [facility]

## 2021-02-10 ENCOUNTER — Inpatient Hospital Stay: Payer: Medicare Other

## 2021-02-10 ENCOUNTER — Inpatient Hospital Stay: Payer: Medicare Other | Attending: Oncology | Admitting: Oncology

## 2021-02-10 ENCOUNTER — Encounter: Payer: Self-pay | Admitting: Oncology

## 2021-02-10 VITALS — BP 102/74 | HR 81 | Temp 97.2°F | Resp 16 | Wt 150.0 lb

## 2021-02-10 DIAGNOSIS — Z79899 Other long term (current) drug therapy: Secondary | ICD-10-CM | POA: Diagnosis not present

## 2021-02-10 DIAGNOSIS — Z87891 Personal history of nicotine dependence: Secondary | ICD-10-CM | POA: Insufficient documentation

## 2021-02-10 DIAGNOSIS — C3490 Malignant neoplasm of unspecified part of unspecified bronchus or lung: Secondary | ICD-10-CM | POA: Diagnosis not present

## 2021-02-10 DIAGNOSIS — R413 Other amnesia: Secondary | ICD-10-CM

## 2021-02-10 DIAGNOSIS — Z5112 Encounter for antineoplastic immunotherapy: Secondary | ICD-10-CM | POA: Insufficient documentation

## 2021-02-10 DIAGNOSIS — Z8589 Personal history of malignant neoplasm of other organs and systems: Secondary | ICD-10-CM | POA: Diagnosis not present

## 2021-02-10 DIAGNOSIS — R5383 Other fatigue: Secondary | ICD-10-CM

## 2021-02-10 LAB — CBC WITH DIFFERENTIAL/PLATELET
Abs Immature Granulocytes: 0.05 10*3/uL (ref 0.00–0.07)
Basophils Absolute: 0 10*3/uL (ref 0.0–0.1)
Basophils Relative: 1 %
Eosinophils Absolute: 0.3 10*3/uL (ref 0.0–0.5)
Eosinophils Relative: 5 %
HCT: 35.3 % — ABNORMAL LOW (ref 36.0–46.0)
Hemoglobin: 12.1 g/dL (ref 12.0–15.0)
Immature Granulocytes: 1 %
Lymphocytes Relative: 26 %
Lymphs Abs: 1.4 10*3/uL (ref 0.7–4.0)
MCH: 30.6 pg (ref 26.0–34.0)
MCHC: 34.3 g/dL (ref 30.0–36.0)
MCV: 89.1 fL (ref 80.0–100.0)
Monocytes Absolute: 0.7 10*3/uL (ref 0.1–1.0)
Monocytes Relative: 13 %
Neutro Abs: 3 10*3/uL (ref 1.7–7.7)
Neutrophils Relative %: 54 %
Platelets: 171 10*3/uL (ref 150–400)
RBC: 3.96 MIL/uL (ref 3.87–5.11)
RDW: 13 % (ref 11.5–15.5)
WBC: 5.5 10*3/uL (ref 4.0–10.5)
nRBC: 0 % (ref 0.0–0.2)

## 2021-02-10 LAB — COMPREHENSIVE METABOLIC PANEL
ALT: 14 U/L (ref 0–44)
AST: 18 U/L (ref 15–41)
Albumin: 4 g/dL (ref 3.5–5.0)
Alkaline Phosphatase: 45 U/L (ref 38–126)
Anion gap: 11 (ref 5–15)
BUN: 16 mg/dL (ref 8–23)
CO2: 22 mmol/L (ref 22–32)
Calcium: 9.1 mg/dL (ref 8.9–10.3)
Chloride: 101 mmol/L (ref 98–111)
Creatinine, Ser: 0.74 mg/dL (ref 0.44–1.00)
GFR, Estimated: >60 mL/min (ref >60)
Glucose, Bld: 104 mg/dL — ABNORMAL HIGH (ref 70–99)
Potassium: 3.6 mmol/L (ref 3.5–5.1)
Sodium: 134 mmol/L — ABNORMAL LOW (ref 135–145)
Total Bilirubin: 0.6 mg/dL (ref 0.3–1.2)
Total Protein: 7.4 g/dL (ref 6.5–8.1)

## 2021-02-10 LAB — T4, FREE: Free T4: 0.98 ng/dL (ref 0.61–1.12)

## 2021-02-10 LAB — TSH: TSH: 2.129 u[IU]/mL (ref 0.350–4.500)

## 2021-02-10 MED ORDER — HEPARIN SOD (PORK) LOCK FLUSH 100 UNIT/ML IV SOLN
500.0000 [IU] | Freq: Once | INTRAVENOUS | Status: AC | PRN
  Administered 2021-02-10: 500 [IU]
  Filled 2021-02-10: qty 5

## 2021-02-10 MED ORDER — DEXAMETHASONE SODIUM PHOSPHATE 10 MG/ML IJ SOLN
10.0000 mg | Freq: Once | INTRAMUSCULAR | Status: AC
  Administered 2021-02-10: 10 mg via INTRAVENOUS
  Filled 2021-02-10: qty 1

## 2021-02-10 MED ORDER — SODIUM CHLORIDE 0.9 % IV SOLN
1200.0000 mg | Freq: Once | INTRAVENOUS | Status: AC
  Administered 2021-02-10: 1200 mg via INTRAVENOUS
  Filled 2021-02-10: qty 20

## 2021-02-10 MED ORDER — SODIUM CHLORIDE 0.9 % IV SOLN
Freq: Once | INTRAVENOUS | Status: AC
  Filled 2021-02-10: qty 250

## 2021-02-10 MED ORDER — HEPARIN SOD (PORK) LOCK FLUSH 100 UNIT/ML IV SOLN
INTRAVENOUS | Status: AC
  Filled 2021-02-10: qty 5

## 2021-02-10 NOTE — Progress Notes (Signed)
Oncology clearance has been received from Dr Tasia Catchings. The patient is cleared at Low risk for surgery from an Oncology aspect.

## 2021-02-10 NOTE — Progress Notes (Signed)
Hematology/Oncology Follow up note [facility] Telephone:([phone] [phone]   Patient Care Team: Glean Hess, MD as PCP - General (Internal Medicine) Charolette Forward, MD as Consulting Physician (Cardiology) Telford Nab, RN as Registered Nurse [name], MD as Radiation Oncologist (Radiation Oncology)  REFERRING PROVIDER: Dr. Felicie Morn REASON FOR VISIT:  Follow-up for small cell lung cancer,   HISTORY OF PRESENTING ILLNESS:  Kristi [name] is a  75 y.o.  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  10/20/2018 CT chest with contrast showed large mediastinal mass involving both hilar, left greater than right, consistent with lung carcinoma, The mass causes narrowing of the left mainstem bronchus with resultant volume loss on the left and a mediastinal shift to the left.  Moderate size left pleural effusion. 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  # Initial MRI brain negative.  # Nov 2019- Jan 2020 s/p 4 cycles of Carbo/Etoposide/Tecentriq #  01/24/2019 interim CT scan done which showed continued positive response to therapy with continued reduction in mediastinal adenopathy.  No residual measurable left lung mass. CT findings of acute emphysematous cystitis. Urology did not feel that patient has pyelonephritis and recommend treatment with antibiotics because patient's immunocompromise. Patient finished treatment.   # 11/02/2018-01/09/2019 Chemotherapy carboplatin + Etoposide + tecentriq # Consolidation chest radiation and whole brain radiation finished in May 2020 # 04/25/2019 resume Tecentriq every 3 weeks. # Patient had a fall from her stairs on 01/19/2020. In the emergency room she had CT chest abdomen pelvis CT, CT head without contrast, CT cervical spine without contrast. No CT evidence for acute  intracranial abnormality, degenerative changes of cervical spine..  No CT evidence of acute thoracic abdomen injury.  Severe compression fracture of T7, subacute.  # kyphoplasty procedure on 03/01/2020.  T7 biopsy negative for cancer.  11/20/2020 Surveillance CT scan  Reduced size and prominence of right lateral lung base subpleural nodular density. No findings of active malignancy.  MRI brain showed stable post treatment brain. No metastatic disease.    INTERVAL HISTORY Kristi [name] is a 75 y.o. female who has above history reviewed by me today presents for evaluation prior to immunotherapy for treatment of extensive small cell lung cancer. Patient is on immunotherapy maintenance. Clinically patient is doing very well.  No new complaints.  Appetite is good. She has gained weight. She reports blurry vision for which she is going to see ophthalmologist for further evaluation.  Review of Systems  Constitutional: Positive for fatigue. Negative for appetite change, chills and fever.  HENT:   Negative for hearing loss and voice change.   Eyes: Negative for eye problems.  Respiratory: Negative for chest tightness, cough and shortness of breath.   Cardiovascular: Negative for chest pain.  Gastrointestinal: Negative for abdominal distention, abdominal pain, blood in stool, diarrhea and nausea.  Endocrine: Negative for hot flashes.  Genitourinary: Negative for difficulty urinating and frequency.   Musculoskeletal: Negative for arthralgias and back pain.  Skin: Negative for itching and rash.  Neurological: Negative for extremity weakness and headaches.  Hematological: Negative for adenopathy.  Psychiatric/Behavioral: Negative for confusion and depression. The patient is not nervous/anxious.        Forgetful     MEDICAL HISTORY:  Past Medical History:  Diagnosis Date  . Acute MI, inferoposterior wall ([place]) 09/30/2014  . Claustrophobia   . Coronary artery disease   .  GERD  (gastroesophageal reflux disease)   . Hypercholesteremia   . Hypertension   . MI, old   . Pneumonia   . Small cell lung cancer ([place])   . 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 [facility]) 10/27/2018    SURGICAL HISTORY: Past Surgical History:  Procedure Laterality Date  . APPENDECTOMY     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  . BLADDER NECK SUSPENSION    . CHOLECYSTECTOMY N/A 08/14/2018   Procedure: LAPAROSCOPIC CHOLECYSTECTOMY;  Surgeon: Jules Husbands, MD;  Location: [facility] ORS;  Service: General;  Laterality: N/A;  . CHOLECYSTECTOMY  11/2018  . CORONARY ANGIOPLASTY  09/29/2014  . CORONARY STENT PLACEMENT    . ENDOBRONCHIAL ULTRASOUND N/A 10/21/2018   Procedure: ENDOBRONCHIAL ULTRASOUND;  Surgeon: Laverle Hobby, MD;  Location: [facility] ORS;  Service: Pulmonary;  Laterality: N/A;  . HERNIA REPAIR  2012  . IR FLUORO GUIDE CV LINE RIGHT  12/19/2018  . KYPHOPLASTY N/A 03/01/2020   Procedure: T7 KYPHOPLASTY;  Surgeon: Hessie Knows, MD;  Location: [facility] ORS;  Service: Orthopedics;  Laterality: N/A;  . LEFT HEART CATHETERIZATION WITH CORONARY ANGIOGRAM N/A 09/29/2014   Procedure: LEFT HEART CATHETERIZATION WITH CORONARY ANGIOGRAM;  Surgeon: Clent Demark, MD;  Location: [place] CATH LAB;  Service: Cardiovascular;  Laterality: N/A;  . PORTA CATH INSERTION N/A 01/02/2019   Procedure: PORTA CATH INSERTION;  Surgeon: Algernon Huxley, MD;  Location: [place] CV LAB;  Service: Cardiovascular;  Laterality: N/A;  . PORTACATH PLACEMENT Right 10/28/2018   Procedure: INSERTION PORT-A-CATH;  Surgeon: Jules Husbands, MD;  Location: [facility] ORS;  Service: General;  Laterality: Right;    SOCIAL HISTORY: Social History   Socioeconomic History  . Marital status: Married    Spouse name: [name]   . Number of children: 3  . Years of education: Not on file  . Highest education level: Not on file  Occupational History  . Occupation: Retired    Comment: Chief Technology Officer   Tobacco Use  . Smoking status: Former Smoker    Packs/day:  1.00    Years: 39.00    Pack years: 39.00    Types: Cigarettes    Start date: 12/21/1978    Quit date: 10/16/2018    Years since quitting: 2.3  . Smokeless tobacco: Never Used  Vaping Use  . Vaping Use: Never used  Substance and Sexual Activity  . Alcohol use: Yes    Alcohol/week: 0.0 standard drinks    Comment: occassional - approx 1 every 2 weeks   . Drug use: No  . Sexual activity: Yes    Birth control/protection: None  Other Topics Concern  . Not on file  Social History Narrative  . Not on file   Social Determinants of Health   Financial Resource Strain: Not on file  Food Insecurity: Not on file  Transportation Needs: Not on file  Physical Activity: Not on file  Stress: Not on file  Social Connections: Not on file  Intimate Partner Violence: Not on file    FAMILY HISTORY: Family History  Problem Relation Age of Onset  . Alzheimer's disease Mother   . Colon cancer Father   . Breast cancer Neg Hx     ALLERGIES:  has No Known Allergies.  MEDICATIONS:  Current Outpatient Medications  Medication Sig Dispense Refill  . acetaminophen (TYLENOL) 500 MG tablet Take 500 mg by mouth 2 (two) times daily as needed for moderate pain or headache.    Huey Bienenstock (TECENTRIQ IV) Inject 1 Dose into  the vein every 30 (thirty) days.    . Cholecalciferol (DIALYVITE VITAMIN D 5000) 125 MCG (5000 UT) capsule Take 5,000 Units by mouth daily.    . diphenhydrAMINE-zinc acetate (BENADRYL EXTRA STRENGTH) cream Apply 1 application topically 3 (three) times daily as needed for itching. 28.4 g 0  . HYDROcodone-acetaminophen (NORCO/VICODIN) 5-325 MG tablet Take 1 tablet by mouth every 6 (six) hours as needed for moderate pain. 90 tablet 0  . lidocaine-prilocaine (EMLA) cream Apply 1 application topically daily as needed (port access). 30 g 2  . loperamide (IMODIUM A-D) 2 MG tablet Take 2 mg by mouth 4 (four) times daily as needed for diarrhea or loose stools.     . nitroGLYCERIN (NITROSTAT)  0.4 MG SL tablet Place 1 tablet (0.4 mg total) under the tongue every 5 (five) minutes x 3 doses as needed for chest pain. 25 tablet 12  . omeprazole (PRILOSEC) 40 MG capsule Take 1 capsule (40 mg total) by mouth daily. 90 capsule 1  . polyethylene glycol (MIRALAX / GLYCOLAX) packet Take 17 g by mouth daily as needed for mild constipation.    . senna (SENOKOT) 8.6 MG TABS tablet Take 2 tablets (17.2 mg total) by mouth daily. 120 tablet 0  . sertraline (ZOLOFT) 50 MG tablet Take 1.5 tablets (75 mg total) by mouth daily. 45 tablet 6  . vitamin B-12 (CYANOCOBALAMIN) 1000 MCG tablet Take 1,000 mcg by mouth daily.    . Zoledronic Acid (ZOMETA IV) Inject 1 Dose into the vein every 30 (thirty) days.     No current facility-administered medications for this visit.   Facility-Administered Medications Ordered in Other Visits  Medication Dose Route Frequency Provider Last Rate Last Admin  . heparin lock flush 100 unit/mL  500 Units Intravenous Once Earlie Server, MD      . heparin lock flush 100 unit/mL  500 Units Intravenous Once Earlie Server, MD      . sodium chloride flush (NS) 0.9 % injection 10 mL  10 mL Intravenous PRN Earlie Server, MD   10 mL at 01/09/19 0820  . sodium chloride flush (NS) 0.9 % injection 10 mL  10 mL Intravenous PRN Earlie Server, MD   10 mL at 01/10/19 1400     PHYSICAL EXAMINATION: ECOG PERFORMANCE STATUS: 1 - Symptomatic but completely ambulatory Vitals:   02/10/21 0904  BP: 102/74  Pulse: 81  Resp: 16  Temp: (!) 97.2 F (36.2 C)   Filed Weights   02/10/21 0904  Weight: 150 lb (68 kg)   Physical Examination Today's Vitals   02/10/21 0904  BP: 102/74  Pulse: 81  Resp: 16  Temp: (!) 97.2 F (36.2 C)  Weight: 150 lb (68 kg)  PainSc: 0-No pain   Body mass index is 24.21 kg/m.   Physical Exam Constitutional:      General: She is not in acute distress.    Appearance: She is not diaphoretic.     Comments: Patient sits in the wheelchair.  HENT:     Head: Normocephalic and  atraumatic.     Nose: Nose normal.     Mouth/Throat:     Pharynx: No oropharyngeal exudate.  Eyes:     General: No scleral icterus.    Pupils: Pupils are equal, round, and reactive to light.  Cardiovascular:     Rate and Rhythm: Normal rate and regular rhythm.     Heart sounds: No murmur heard.   Pulmonary:     Effort: Pulmonary effort is normal. No  respiratory distress.     Breath sounds: No wheezing or rales.     Comments: Decreased breath sounds bilaterally.  Abdominal:     General: There is no distension.     Palpations: Abdomen is soft.     Tenderness: There is no abdominal tenderness.  Musculoskeletal:        General: Normal range of motion.     Cervical back: Normal range of motion and neck supple.  Skin:    General: Skin is warm and dry.     Findings: No erythema.  Neurological:     Mental Status: She is alert and oriented to person, place, and time.     Cranial Nerves: No cranial nerve deficit.     Motor: No abnormal muscle tone.     Coordination: Coordination normal.  Psychiatric:        Mood and Affect: Affect normal.        LABORATORY DATA:  I have reviewed the data as listed Lab Results  Component Value Date   WBC 5.5 02/10/2021   HGB 12.1 02/10/2021   HCT 35.3 (L) 02/10/2021   MCV 89.1 02/10/2021   PLT 171 02/10/2021   Recent Labs    08/12/20 1315 09/02/20 1358 09/23/20 0845 10/14/20 0839 12/30/20 0834 01/20/21 0857 02/10/21 0846  NA 134* 137 138   < > 135 132* 134*  K 4.0 3.5 4.0   < > 3.8 3.9 3.6  CL 104 105 106   < > 102 100 101  CO2 23 24 24    < > 22 26 22   GLUCOSE 98 103* 91   < > 111* 103* 104*  BUN 13 18 14    < > 14 18 16   CREATININE 0.81 0.68 0.75   < > 0.84 0.81 0.74  CALCIUM 8.8* 8.3* 8.6*   < > 9.2 9.1 9.1  GFRNONAA >60 >60 >60   < > >60 >60 >60  GFRAA >60 >60 >60  --   --   --   --   PROT 7.4 6.8 7.0   < > 7.3 7.3 7.4  ALBUMIN 4.0 3.7 3.8   < > 4.2 3.9 4.0  AST 17 14* 17   < > 20 21 18   ALT 12 11 11    < > 12 13 14    ALKPHOS 49 49 47   < > 42 44 45  BILITOT 0.5 0.5 0.6   < > 0.5 0.3 0.6   < > = values in this interval not displayed.    RADIOGRAPHIC STUDIES: I have personally reviewed the radiological images as listed and agreed with the findings in the report.  MR Brain W Wo Contrast  Result Date: 11/20/2020 CLINICAL DATA:  Small cell lung cancer with prophylactic cranial radiation June 2020. Restaging EXAM: MRI HEAD WITHOUT AND WITH CONTRAST TECHNIQUE: Multiplanar, multiecho pulse sequences of the brain and surrounding structures were obtained without and with intravenous contrast. CONTRAST:  25mL GADAVIST GADOBUTROL 1 MMOL/ML IV SOLN COMPARISON:  04/12/2020 FINDINGS: Brain: No enhancement or swelling to suggest metastatic disease. 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 No interval discrete insult. Milder chronic small vessel changes are seen in the pons. No hemorrhage, hydrocephalus, or collection. Vascular: Normal flow voids. Skull and upper cervical spine: Normal marrow signal. Sinuses/Orbits: Negative IMPRESSION: Stable post treatment brain.  No evidence of metastasis. Electronically Signed   By: Monte Fantasia M.D.   On: 11/20/2020 11:26  CT CHEST ABDOMEN PELVIS W CONTRAST  Result Date: 11/20/2020 CLINICAL DATA:  Small cell lung cancer follow up EXAM: CT CHEST, ABDOMEN, AND PELVIS WITH CONTRAST TECHNIQUE: Multidetector CT imaging of the chest, abdomen and pelvis was performed following the standard protocol during bolus administration of intravenous contrast. CONTRAST:  164mL OMNIPAQUE IOHEXOL 300 MG/ML  SOLN COMPARISON:  Multiple exams, including 07/25/2020 FINDINGS: CT CHEST FINDINGS Cardiovascular: Right Port-A-Cath tip: Cavoatrial junction. Coronary, aortic arch, and branch vessel atherosclerotic vascular disease. Mediastinum/Nodes: There is contrast medium in the esophagus suggesting dysmotility or reflux.  No pathologic thoracic adenopathy is identified. Lungs/Pleura: Calcified granulomas in the right lung. Reduced size and prominence of right subpleural nodular densities at the right lateral lung base, with the component along the diaphragm essentially resolved and the component along the lateral pleural margin measuring about 0.7 cm in thickness (image 89 series 4), previously 1.0 cm. There is a small calcified granuloma in the left upper lobe. Increased bandlike atelectasis anteriorly in the left lower lobe. Centrilobular emphysema. Musculoskeletal: Deformity related to transverse upper sternal body fracture again observed, with increased bony bridging compared to the 07/25/2020 exam. Prominent wedge compression fracture at T7 with vertebral augmentation common not appreciably changed. Inferior endplate compression fracture at T4 and T8, unchanged. CT ABDOMEN PELVIS FINDINGS Hepatobiliary: Cholecystectomy.  Otherwise unremarkable. Pancreas: Unremarkable Spleen: Punctate calcification compatible with old granulomatous disease. Adrenals/Urinary Tract: At least partially duplicated right renal collecting system. Pelvic floor laxity with cystocele extending 2.2 cm below the pubococcygeal line. Stomach/Bowel: Supraumbilical and umbilical hernias each containing loops of small bowel. More cephalad there is a ventral hernia mesh. No findings of strangulation or obstruction. Mildly low position of the anorectal junction due to pelvic floor laxity. Scattered colonic diverticula. Borderline wall thickening and possible accentuated mucosal enhancement in the nondistended descending and sigmoid colon, cannot exclude a mild distal colitis. Vascular/Lymphatic: Aortoiliac atherosclerotic vascular disease. Reproductive: Unremarkable Other: No supplemental non-categorized findings. Musculoskeletal: Ventral hernias as noted under the stomach/bowel section above. Grade 1 degenerative anterolisthesis at L4-5 with mild right foraminal  stenosis at this level due to disc uncovering facet spurring. IMPRESSION: 1. Reduced size and prominence of right lateral lung base subpleural nodular densities, with the component along the diaphragm essentially resolved. No findings of active malignancy. 2. Borderline wall thickening and possible accentuated mucosal enhancement in the nondistended descending and sigmoid colon, cannot exclude a mild distal colitis. 3. Other imaging findings of potential clinical significance: Coronary, aortic arch, and branch vessel atherosclerotic vascular disease. At least partially duplicated right renal collecting system. Pelvic floor laxity with cystocele extending 2.2 cm below the pubococcygeal line. Supraumbilical and umbilical hernias each containing loops of small bowel. Compression fractures at T4, T8, T7, and T8 as before. The sternal fracture shows increased bony bridging compared to the 07/25/2020 exam. Grade 1 degenerative anterolisthesis at L4-5 with mild right foraminal stenosis at this level due to disc uncovering facet spurring. 4. Emphysema and aortic atherosclerosis. Aortic Atherosclerosis (ICD10-I70.0) and Emphysema (ICD10-J43.9). Electronically Signed   By: Van Clines M.D.   On: 11/20/2020 12:27     ASSESSMENT & PLAN:  1. Small cell lung cancer ([place])   2. Encounter for antineoplastic immunotherapy   3. 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   Currently on Tecentriq maintenance. Labs were reviewed and discussed with patient Counts are stable.  Proceed with Tecentriq. Repeat CT chest abdomen pelvis for surveillance prior to  next visit.  # History bone metastasis, #Osteopenia she will proceed with zometa every 4-6 weeks.   Continue calcium and vitamin D.  Calcium level has been stable.  Patient will proceed with Zometa at next visit.  # COPD/emphysema, Stable. Monitor.  Follow up in 3weeks for eval  with next cycle of tecentriq    Earlie Server, MD, PhD

## 2021-02-10 NOTE — Progress Notes (Signed)
Patient feels that her vision is worsening and has an eye appt in 02/2021.  Also has occasional dizzy episodes.

## 2021-02-12 ENCOUNTER — Encounter: Payer: Self-pay | Admitting: Internal Medicine

## 2021-02-12 ENCOUNTER — Other Ambulatory Visit: Payer: Self-pay

## 2021-02-12 ENCOUNTER — Ambulatory Visit (INDEPENDENT_AMBULATORY_CARE_PROVIDER_SITE_OTHER): Payer: Medicare Other | Admitting: Internal Medicine

## 2021-02-12 VITALS — BP 100/64 | HR 89 | Ht 66.0 in | Wt 143.0 lb

## 2021-02-12 DIAGNOSIS — C3490 Malignant neoplasm of unspecified part of unspecified bronchus or lung: Secondary | ICD-10-CM

## 2021-02-12 DIAGNOSIS — K439 Ventral hernia without obstruction or gangrene: Secondary | ICD-10-CM

## 2021-02-12 DIAGNOSIS — D696 Thrombocytopenia, unspecified: Secondary | ICD-10-CM | POA: Diagnosis not present

## 2021-02-12 DIAGNOSIS — I25119 Atherosclerotic heart disease of native coronary artery with unspecified angina pectoris: Secondary | ICD-10-CM | POA: Diagnosis not present

## 2021-02-12 DIAGNOSIS — J9610 Chronic respiratory failure, unspecified whether with hypoxia or hypercapnia: Secondary | ICD-10-CM | POA: Insufficient documentation

## 2021-02-12 DIAGNOSIS — C7951 Secondary malignant neoplasm of bone: Secondary | ICD-10-CM

## 2021-02-12 NOTE — Patient Instructions (Signed)
Go ahead and call Cardiology for an appointment ASAP - follow up and surgery clearance.

## 2021-02-12 NOTE — Progress Notes (Signed)
Date:  02/12/2021   Name:  Kristi [name] [facility]   DOB:  Dec 08, 1946   MRN:  [id number]   Chief Complaint: surgery clearance Planning ventral hernia repair with Dr. Hampton Abbot under general anesthesia.  Should be minimally invasive as long as it does not enlarge further.  Hypertension This is a chronic problem. The problem is controlled. Associated symptoms include shortness of breath. Pertinent negatives include no chest pain, headaches or palpitations. Hypertensive end-organ damage includes CAD/MI.  CAD - hx of PTCA and stent to distal RCA in 2015.  Previously on aspirin and beta blockers.  She has a cardiologist but has been undergoing treatment for lung cancer over the past 2 years.  She denies chest pain, palpitations, edema.  She has chronic mild SOB with no recent chest infections.  Lab Results  Component Value Date   CREATININE 0.74 02/10/2021   BUN 16 02/10/2021   NA 134 (L) 02/10/2021   K 3.6 02/10/2021   CL 101 02/10/2021   CO2 22 02/10/2021   Lab Results  Component Value Date   CHOL 162 09/19/2018   HDL 51 09/19/2018   LDLCALC 89 09/19/2018   TRIG 108 09/19/2018   CHOLHDL 3.2 09/19/2018   Lab Results  Component Value Date   TSH 2.129 02/10/2021   Lab Results  Component Value Date   HGBA1C 5.3 09/19/2018   Lab Results  Component Value Date   WBC 5.5 02/10/2021   HGB 12.1 02/10/2021   HCT 35.3 (L) 02/10/2021   MCV 89.1 02/10/2021   PLT 171 02/10/2021   Lab Results  Component Value Date   ALT 14 02/10/2021   AST 18 02/10/2021   ALKPHOS 45 02/10/2021   BILITOT 0.6 02/10/2021     Review of Systems  Constitutional: Positive for fatigue. Negative for chills, diaphoresis and fever.  Respiratory: Positive for shortness of breath. Negative for cough, chest tightness and wheezing.   Cardiovascular: Negative for chest pain, palpitations and leg swelling.  Gastrointestinal: Positive for abdominal distention (with intermittent pain and firmness).  Musculoskeletal:  Positive for gait problem. Negative for arthralgias.  Neurological: Positive for light-headedness. Negative for dizziness and headaches.    Patient Active Problem List   Diagnosis Date Noted  . 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 Diarrhea 12/05/2020  . Osteopenia 08/12/2020  . Ventral hernia without obstruction or gangrene 08/12/2020  . Thoracic compression fracture, closed, initial encounter ([place]) 01/22/2020  . Frequent falls 01/22/2020  . Encounter for antineoplastic immunotherapy 09/14/2019  . Memory loss 09/14/2019  . Other fatigue 09/14/2019  . Cancer of hilus of left lung ([place]) 06/08/2019  . Dehydration 01/30/2019  . Acute pyelonephritis 01/24/2019  . Anemia due to antineoplastic chemotherapy 01/09/2019  . Encounter for antineoplastic chemotherapy 11/02/2018  . Cough 10/28/2018  . Dysphagia 10/28/2018  . Decrease in appetite 10/28/2018  . Goals of care, counseling/discussion 10/28/2018  . Small cell lung cancer ([place]) 10/27/2018  . Grade I diastolic dysfunction 53/97/6734  . Localized edema 08/29/2018  . Cholecystitis 08/13/2018  . COPD with exacerbation ([place]) 08/11/2018  . Osteopenia determined by x-ray 05/19/2018  . Prediabetes 02/08/2018  . Hx of fracture of radius 08/18/2016  . Vitamin D deficiency 01/07/2016  . Hyperlipidemia 01/02/2016  . Hypertension 01/02/2016  . Chronic pain 01/02/2016  . Obesity (BMI 30.0-34.9) 01/02/2016  . Coronary artery disease with stable angina pectoris ([place]) 01/02/2016  . IBS (irritable bowel syndrome) 01/02/2016  . FH: colon cancer 01/02/2016  . DDD (degenerative disc  disease), cervical 01/02/2016  . Primary osteoarthritis of left knee 01/02/2016  . Tobacco use disorder 01/02/2016    No Known Allergies  Past Surgical History:  Procedure Laterality Date  . APPENDECTOMY     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  . BLADDER NECK SUSPENSION    . CHOLECYSTECTOMY N/A 08/14/2018   Procedure: LAPAROSCOPIC CHOLECYSTECTOMY;   Surgeon: Jules Husbands, MD;  Location: [facility] ORS;  Service: General;  Laterality: N/A;  . CHOLECYSTECTOMY  11/2018  . CORONARY ANGIOPLASTY  09/29/2014  . CORONARY STENT PLACEMENT    . ENDOBRONCHIAL ULTRASOUND N/A 10/21/2018   Procedure: ENDOBRONCHIAL ULTRASOUND;  Surgeon: Laverle Hobby, MD;  Location: [facility] ORS;  Service: Pulmonary;  Laterality: N/A;  . HERNIA REPAIR  2012  . IR FLUORO GUIDE CV LINE RIGHT  12/19/2018  . KYPHOPLASTY N/A 03/01/2020   Procedure: T7 KYPHOPLASTY;  Surgeon: Hessie Knows, MD;  Location: [facility] ORS;  Service: Orthopedics;  Laterality: N/A;  . LEFT HEART CATHETERIZATION WITH CORONARY ANGIOGRAM N/A 09/29/2014   Procedure: LEFT HEART CATHETERIZATION WITH CORONARY ANGIOGRAM;  Surgeon: Clent Demark, MD;  Location: [place] CATH LAB;  Service: Cardiovascular;  Laterality: N/A;  . PORTA CATH INSERTION N/A 01/02/2019   Procedure: PORTA CATH INSERTION;  Surgeon: Algernon Huxley, MD;  Location: [place] CV LAB;  Service: Cardiovascular;  Laterality: N/A;  . PORTACATH PLACEMENT Right 10/28/2018   Procedure: INSERTION PORT-A-CATH;  Surgeon: Jules Husbands, MD;  Location: [facility] ORS;  Service: General;  Laterality: Right;    Social History   Tobacco Use  . Smoking status: Former Smoker    Packs/day: 1.00    Years: 39.00    Pack years: 39.00    Types: Cigarettes    Start date: 12/21/1978    Quit date: 10/16/2018    Years since quitting: 2.3  . Smokeless tobacco: Never Used  Vaping Use  . Vaping Use: Never used  Substance Use Topics  . Alcohol use: Yes    Alcohol/week: 0.0 standard drinks    Comment: occassional - approx 1 every 2 weeks   . Drug use: No     Medication list has been reviewed and updated.  Current Meds  Medication Sig  . acetaminophen (TYLENOL) 500 MG tablet Take 500 mg by mouth 2 (two) times daily as needed for moderate pain or headache.  Huey Bienenstock (TECENTRIQ IV) Inject 1 Dose into the vein every 30 (thirty) days.  . Cholecalciferol  (DIALYVITE VITAMIN D 5000) 125 MCG (5000 UT) capsule Take 5,000 Units by mouth daily.  . diphenhydrAMINE-zinc acetate (BENADRYL EXTRA STRENGTH) cream Apply 1 application topically 3 (three) times daily as needed for itching.  [name] Kitchen HYDROcodone-acetaminophen (NORCO/VICODIN) 5-325 MG tablet Take 1 tablet by mouth every 6 (six) hours as needed for moderate pain.  [name] Kitchen lidocaine-prilocaine (EMLA) cream Apply 1 application topically daily as needed (port access).  [name] Kitchen loperamide (IMODIUM A-D) 2 MG tablet Take 2 mg by mouth 4 (four) times daily as needed for diarrhea or loose stools.   . nitroGLYCERIN (NITROSTAT) 0.4 MG SL tablet Place 1 tablet (0.4 mg total) under the tongue every 5 (five) minutes x 3 doses as needed for chest pain.  [name] Kitchen omeprazole (PRILOSEC) 40 MG capsule Take 1 capsule (40 mg total) by mouth daily.  . polyethylene glycol (MIRALAX / GLYCOLAX) packet Take 17 g by mouth daily as needed for mild constipation.  . senna (SENOKOT) 8.6 MG TABS tablet Take 2 tablets (17.2 mg total) by mouth daily.  . sertraline (ZOLOFT) 50 MG  tablet Take 1.5 tablets (75 mg total) by mouth daily.  . vitamin B-12 (CYANOCOBALAMIN) 1000 MCG tablet Take 1,000 mcg by mouth daily.  . Zoledronic Acid (ZOMETA IV) Inject 1 Dose into the vein every 30 (thirty) days.    PHQ 2/9 Scores 02/12/2021 09/19/2018 02/08/2018 01/02/2016  PHQ - 2 Score 2 0 0 0  PHQ- 9 Score 4 - - -    GAD 7 : Generalized Anxiety Score 02/12/2021  Nervous, Anxious, on Edge 0  Control/stop worrying 0  Worry too much - different things 0  Trouble relaxing 0  Restless 0  Easily annoyed or irritable 0  Afraid - awful might happen 0  Total GAD 7 Score 0  Anxiety Difficulty Not difficult at all    BP Readings from Last 3 Encounters:  02/12/21 100/64  02/10/21 102/74  02/07/21 116/78    Physical Exam Constitutional:      Appearance: Normal appearance.  Cardiovascular:     Rate and Rhythm: Normal rate and regular rhythm. Occasional extrasystoles are  present.    Heart sounds: No murmur heard.   Pulmonary:     Effort: Pulmonary effort is normal.     Breath sounds: Normal breath sounds. No wheezing.  Abdominal:     Palpations: Abdomen is soft.     Hernia: A hernia (large soft ventral hernia) is present.  Musculoskeletal:     Cervical back: Normal range of motion.     Right lower leg: No edema.     Left lower leg: No edema.  Lymphadenopathy:     Cervical: No cervical adenopathy.  Neurological:     Mental Status: She is alert.  Psychiatric:        Mood and Affect: Mood normal.        Behavior: Behavior normal.     Wt Readings from Last 3 Encounters:  02/12/21 143 lb (64.9 kg)  02/10/21 150 lb (68 kg)  02/07/21 145 lb (65.8 kg)    BP 100/64   Pulse 89   Ht 5\' 6"  (1.676 m)   Wt 143 lb (64.9 kg)   SpO2 95%   BMI 23.08 kg/m   Assessment and Plan: 1. Atherosclerosis of native coronary artery of native heart with angina pectoris ([place]) S/p PTCA and stent; not on aspirin or beta blockers or statins Recommend Cardiology visit prior to elective surgery for clearance  2. Thrombocytopenia ([place]) Has been stable around 160K and monitored regularly by Oncology  3. Lung cancer metastatic to bone [facility]) Now on maintenance treatment with Tecentriq IV every 30 days  4. Ventral hernia without obstruction or gangrene Cleared from my standpoint as long as Cardiology concur Form indicating the same sent to Dr. Hampton Abbot   Partially dictated using Dragon software. Any errors are unintentional.  Halina Maidens, MD [facility]  02/12/2021

## 2021-02-12 NOTE — Progress Notes (Signed)
Received correspondence from Dr Gaspar Cola office. They would like the patient to be seen and cleared by Cardiology before they will clear her for Medical for surgery. The patient is scheduled to see Dr Charolette Forward on 02/14/21 at 1:45 pm. Notes and clearance request have been faxed to his office.

## 2021-02-14 DIAGNOSIS — I10 Essential (primary) hypertension: Secondary | ICD-10-CM | POA: Diagnosis not present

## 2021-02-14 DIAGNOSIS — E559 Vitamin D deficiency, unspecified: Secondary | ICD-10-CM | POA: Diagnosis not present

## 2021-02-14 DIAGNOSIS — E785 Hyperlipidemia, unspecified: Secondary | ICD-10-CM | POA: Diagnosis not present

## 2021-02-14 DIAGNOSIS — I2510 Atherosclerotic heart disease of native coronary artery without angina pectoris: Secondary | ICD-10-CM | POA: Diagnosis not present

## 2021-02-14 DIAGNOSIS — C3492 Malignant neoplasm of unspecified part of left bronchus or lung: Secondary | ICD-10-CM | POA: Diagnosis not present

## 2021-02-17 ENCOUNTER — Telehealth: Payer: Self-pay | Admitting: Surgery

## 2021-02-17 ENCOUNTER — Ambulatory Visit (INDEPENDENT_AMBULATORY_CARE_PROVIDER_SITE_OTHER): Payer: Medicare Other | Admitting: Surgery

## 2021-02-17 ENCOUNTER — Encounter: Payer: Self-pay | Admitting: Surgery

## 2021-02-17 ENCOUNTER — Other Ambulatory Visit: Payer: Self-pay

## 2021-02-17 VITALS — BP 121/70 | HR 78 | Temp 97.8°F | Ht 67.0 in | Wt 148.2 lb

## 2021-02-17 DIAGNOSIS — K432 Incisional hernia without obstruction or gangrene: Secondary | ICD-10-CM

## 2021-02-17 DIAGNOSIS — I25119 Atherosclerotic heart disease of native coronary artery with unspecified angina pectoris: Secondary | ICD-10-CM | POA: Diagnosis not present

## 2021-02-17 NOTE — H&P (View-Only) (Signed)
02/17/2021  History of Present Illness: Kristi [name] is a 75 y.o. female presenting for follow up of an incisional hernia.  The patient was last seen on 02/07/21 for initial consultation.  She has obtained clearance from Dr. Tasia Catchings with Oncology, and saw Dr. Terrence Dupont with Cardiology on 2/25 and was also cleared for surgery.  Currently pending medical clearance from Dr. Army Melia, but she had wanted Cardiology to see her prior to her clearance.  Currently no further testing is needed.  She was started on Aspirin 81 mg by Dr. Terrence Dupont, but ok to pause for surgery.  She denies any worsening pain or symptoms over the past 10 days.  Past Medical History: Past Medical History:  Diagnosis Date  . Acute MI, inferoposterior wall ([place]) 09/30/2014  . Claustrophobia   . Coronary artery disease   . GERD (gastroesophageal reflux disease)   . Hypercholesteremia   . Hypertension   . MI, old   . Pneumonia   . Small cell lung cancer ([place])   . 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 [facility]) 10/27/2018     Past Surgical History: Past Surgical History:  Procedure Laterality Date  . APPENDECTOMY     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  . BLADDER NECK SUSPENSION    . CHOLECYSTECTOMY N/A 08/14/2018   Procedure: LAPAROSCOPIC CHOLECYSTECTOMY;  Surgeon: Jules Husbands, MD;  Location: [facility] ORS;  Service: General;  Laterality: N/A;  . CHOLECYSTECTOMY  11/2018  . CORONARY ANGIOPLASTY  09/29/2014  . CORONARY STENT PLACEMENT    . ENDOBRONCHIAL ULTRASOUND N/A 10/21/2018   Procedure: ENDOBRONCHIAL ULTRASOUND;  Surgeon: Laverle Hobby, MD;  Location: [facility] ORS;  Service: Pulmonary;  Laterality: N/A;  . HERNIA REPAIR  2012  . IR FLUORO GUIDE CV LINE RIGHT  12/19/2018  . KYPHOPLASTY N/A 03/01/2020   Procedure: T7 KYPHOPLASTY;  Surgeon: Hessie Knows, MD;  Location: [facility] ORS;  Service: Orthopedics;  Laterality: N/A;  . LEFT HEART CATHETERIZATION WITH CORONARY ANGIOGRAM N/A 09/29/2014   Procedure: LEFT HEART CATHETERIZATION WITH  CORONARY ANGIOGRAM;  Surgeon: Clent Demark, MD;  Location: [place] CATH LAB;  Service: Cardiovascular;  Laterality: N/A;  . PORTA CATH INSERTION N/A 01/02/2019   Procedure: PORTA CATH INSERTION;  Surgeon: Algernon Huxley, MD;  Location: [place] CV LAB;  Service: Cardiovascular;  Laterality: N/A;  . PORTACATH PLACEMENT Right 10/28/2018   Procedure: INSERTION PORT-A-CATH;  Surgeon: Jules Husbands, MD;  Location: [facility] ORS;  Service: General;  Laterality: Right;    Home Medications: Prior to Admission medications   Medication Sig Start Date End Date Taking? Authorizing Provider  acetaminophen (TYLENOL) 500 MG tablet Take 500 mg by mouth 2 (two) times daily as needed for moderate pain or headache.   Yes [provider]  Cholecalciferol (DIALYVITE VITAMIN D 5000) 125 MCG (5000 UT) capsule Take 5,000 Units by mouth daily.   Yes [provider]  diphenhydrAMINE-zinc acetate (BENADRYL EXTRA STRENGTH) cream Apply 1 application topically 3 (three) times daily as needed for itching. 05/18/19  Yes Earlie Server, MD  HYDROcodone-acetaminophen (NORCO/VICODIN) 5-325 MG tablet Take 1 tablet by mouth every 6 (six) hours as needed for moderate pain. 06/25/20  Yes Borders, Kirt Boys, NP  lidocaine-prilocaine (EMLA) cream Apply 1 application topically daily as needed (port access). 04/15/20  Yes Borders, Kirt Boys, NP  loperamide (IMODIUM A-D) 2 MG tablet Take 2 mg by mouth 4 (four) times daily as needed for diarrhea or loose stools.    Yes [provider]  nitroGLYCERIN (NITROSTAT) 0.4  MG SL tablet Place 1 tablet (0.4 mg total) under the tongue every 5 (five) minutes x 3 doses as needed for chest pain. 10/02/14  Yes Charolette Forward, MD  omeprazole (PRILOSEC) 40 MG capsule Take 1 capsule (40 mg total) by mouth daily. 10/17/20  Yes Earlie Server, MD  polyethylene glycol [facility] / Floria Raveling) packet Take 17 g by mouth daily as needed for mild constipation.   Yes [provider]  sertraline (ZOLOFT) 50  MG tablet Take 1.5 tablets (75 mg total) by mouth daily. 06/25/20  Yes Borders, Kirt Boys, NP  vitamin B-12 (CYANOCOBALAMIN) 1000 MCG tablet Take 1,000 mcg by mouth daily.   Yes [provider]    Allergies: No Known Allergies  Review of Systems: Review of Systems  Constitutional: Negative for chills and fever.  Respiratory: Negative for shortness of breath.   Cardiovascular: Negative for chest pain.  Gastrointestinal: Positive for abdominal pain. Negative for nausea and vomiting.    Physical Exam BP 121/70   Pulse 78   Temp 97.8 F (36.6 C) (Oral)   Ht 5\' 7"  (1.702 m)   Wt 148 lb 3.2 oz (67.2 kg)   SpO2 96%   BMI 23.21 kg/m  CONSTITUTIONAL: No acute distress HEENT:  Normocephalic, atraumatic, extraocular motion intact. RESPIRATORY:  Normal respiratory effort without pathologic use of accessory muscles. CARDIOVASCULAR: Regular rhythm and rate. GI: The abdomen is soft, non-distended, with some discomfort to palpation over the hernia, but no concerning signs.  No incarceration or strangulation. NEUROLOGIC:  Motor and sensation is grossly normal.  Cranial nerves are grossly intact. PSYCH:  Alert and oriented to person, place and time. Affect is normal.  Labs/Imaging: Labs from 02/10/21: Na 134, K 3.6, Cl 101, CO2 22, BUN 16, Cr 0.74.  LFTs within normal.  WBC 5.5, Hgb 12.1, Hct 35.3, Plt 171.  Assessment and Plan: This is a 75 y.o. female with an incisional hernia.  --Discussed with the patient that now that Dr. Tasia Catchings and Dr. Terrence Dupont have seen her and cleared her for surgery, we're just waiting for Dr. Gaspar Cola clearance.  Will tentatively schedule her for robotic incisional hernia repair on 02/27/21, pending clearance from Dr. Army Melia.  Discussed that she would have to pause her Aspirin, with last dose on 02/21/21.  We should be able to resume 48 hrs after surgery.  Discussed again that given that the hernia size is at the borderline cutoff, may not be able to fully close the  hernia, but would still reinforce with mesh.  She is still agreeable to this potential setback and did not want a referral to tertiary center.  Reviewed again the risks of bleeding, infection, and injury to surrounding structures.  She's willing to proceed.  She understands that she would need a COVID-19 test prior to surgery.  All of her questions have been answered.  Face-to-face time spent with the patient and care providers was 25 minutes, with more than 50% of the time spent counseling, educating, and coordinating care of the patient.     Melvyn Neth, [facility]

## 2021-02-17 NOTE — Progress Notes (Signed)
02/17/2021  History of Present Illness: Kristi [name] is a 75 y.o. female presenting for follow up of an incisional hernia.  The patient was last seen on 02/07/21 for initial consultation.  She has obtained clearance from Dr. Tasia Catchings with Oncology, and saw Dr. Terrence Dupont with Cardiology on 2/25 and was also cleared for surgery.  Currently pending medical clearance from Dr. Army Melia, but she had wanted Cardiology to see her prior to her clearance.  Currently no further testing is needed.  She was started on Aspirin 81 mg by Dr. Terrence Dupont, but ok to pause for surgery.  She denies any worsening pain or symptoms over the past 10 days.  Past Medical History: Past Medical History:  Diagnosis Date  . Acute MI, inferoposterior wall ([place]) 09/30/2014  . Claustrophobia   . Coronary artery disease   . GERD (gastroesophageal reflux disease)   . Hypercholesteremia   . Hypertension   . MI, old   . Pneumonia   . Small cell lung cancer ([place])   . 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 [facility]) 10/27/2018     Past Surgical History: Past Surgical History:  Procedure Laterality Date  . APPENDECTOMY     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  . BLADDER NECK SUSPENSION    . CHOLECYSTECTOMY N/A 08/14/2018   Procedure: LAPAROSCOPIC CHOLECYSTECTOMY;  Surgeon: Jules Husbands, MD;  Location: [facility] ORS;  Service: General;  Laterality: N/A;  . CHOLECYSTECTOMY  11/2018  . CORONARY ANGIOPLASTY  09/29/2014  . CORONARY STENT PLACEMENT    . ENDOBRONCHIAL ULTRASOUND N/A 10/21/2018   Procedure: ENDOBRONCHIAL ULTRASOUND;  Surgeon: Laverle Hobby, MD;  Location: [facility] ORS;  Service: Pulmonary;  Laterality: N/A;  . HERNIA REPAIR  2012  . IR FLUORO GUIDE CV LINE RIGHT  12/19/2018  . KYPHOPLASTY N/A 03/01/2020   Procedure: T7 KYPHOPLASTY;  Surgeon: Hessie Knows, MD;  Location: [facility] ORS;  Service: Orthopedics;  Laterality: N/A;  . LEFT HEART CATHETERIZATION WITH CORONARY ANGIOGRAM N/A 09/29/2014   Procedure: LEFT HEART CATHETERIZATION WITH  CORONARY ANGIOGRAM;  Surgeon: Clent Demark, MD;  Location: [place] CATH LAB;  Service: Cardiovascular;  Laterality: N/A;  . PORTA CATH INSERTION N/A 01/02/2019   Procedure: PORTA CATH INSERTION;  Surgeon: Algernon Huxley, MD;  Location: [place] CV LAB;  Service: Cardiovascular;  Laterality: N/A;  . PORTACATH PLACEMENT Right 10/28/2018   Procedure: INSERTION PORT-A-CATH;  Surgeon: Jules Husbands, MD;  Location: [facility] ORS;  Service: General;  Laterality: Right;    Home Medications: Prior to Admission medications   Medication Sig Start Date End Date Taking? Authorizing Provider  acetaminophen (TYLENOL) 500 MG tablet Take 500 mg by mouth 2 (two) times daily as needed for moderate pain or headache.   Yes [provider]  Cholecalciferol (DIALYVITE VITAMIN D 5000) 125 MCG (5000 UT) capsule Take 5,000 Units by mouth daily.   Yes [provider]  diphenhydrAMINE-zinc acetate (BENADRYL EXTRA STRENGTH) cream Apply 1 application topically 3 (three) times daily as needed for itching. 05/18/19  Yes Earlie Server, MD  HYDROcodone-acetaminophen (NORCO/VICODIN) 5-325 MG tablet Take 1 tablet by mouth every 6 (six) hours as needed for moderate pain. 06/25/20  Yes Borders, Kirt Boys, NP  lidocaine-prilocaine (EMLA) cream Apply 1 application topically daily as needed (port access). 04/15/20  Yes Borders, Kirt Boys, NP  loperamide (IMODIUM A-D) 2 MG tablet Take 2 mg by mouth 4 (four) times daily as needed for diarrhea or loose stools.    Yes [provider]  nitroGLYCERIN (NITROSTAT) 0.4  MG SL tablet Place 1 tablet (0.4 mg total) under the tongue every 5 (five) minutes x 3 doses as needed for chest pain. 10/02/14  Yes Charolette Forward, MD  omeprazole (PRILOSEC) 40 MG capsule Take 1 capsule (40 mg total) by mouth daily. 10/17/20  Yes Earlie Server, MD  polyethylene glycol [facility] / Floria Raveling) packet Take 17 g by mouth daily as needed for mild constipation.   Yes [provider]  sertraline (ZOLOFT) 50  MG tablet Take 1.5 tablets (75 mg total) by mouth daily. 06/25/20  Yes Borders, Kirt Boys, NP  vitamin B-12 (CYANOCOBALAMIN) 1000 MCG tablet Take 1,000 mcg by mouth daily.   Yes [provider]    Allergies: No Known Allergies  Review of Systems: Review of Systems  Constitutional: Negative for chills and fever.  Respiratory: Negative for shortness of breath.   Cardiovascular: Negative for chest pain.  Gastrointestinal: Positive for abdominal pain. Negative for nausea and vomiting.    Physical Exam BP 121/70   Pulse 78   Temp 97.8 F (36.6 C) (Oral)   Ht 5\' 7"  (1.702 m)   Wt 148 lb 3.2 oz (67.2 kg)   SpO2 96%   BMI 23.21 kg/m  CONSTITUTIONAL: No acute distress HEENT:  Normocephalic, atraumatic, extraocular motion intact. RESPIRATORY:  Normal respiratory effort without pathologic use of accessory muscles. CARDIOVASCULAR: Regular rhythm and rate. GI: The abdomen is soft, non-distended, with some discomfort to palpation over the hernia, but no concerning signs.  No incarceration or strangulation. NEUROLOGIC:  Motor and sensation is grossly normal.  Cranial nerves are grossly intact. PSYCH:  Alert and oriented to person, place and time. Affect is normal.  Labs/Imaging: Labs from 02/10/21: Na 134, K 3.6, Cl 101, CO2 22, BUN 16, Cr 0.74.  LFTs within normal.  WBC 5.5, Hgb 12.1, Hct 35.3, Plt 171.  Assessment and Plan: This is a 75 y.o. female with an incisional hernia.  --Discussed with the patient that now that Dr. Tasia Catchings and Dr. Terrence Dupont have seen her and cleared her for surgery, we're just waiting for Dr. Gaspar Cola clearance.  Will tentatively schedule her for robotic incisional hernia repair on 02/27/21, pending clearance from Dr. Army Melia.  Discussed that she would have to pause her Aspirin, with last dose on 02/21/21.  We should be able to resume 48 hrs after surgery.  Discussed again that given that the hernia size is at the borderline cutoff, may not be able to fully close the  hernia, but would still reinforce with mesh.  She is still agreeable to this potential setback and did not want a referral to tertiary center.  Reviewed again the risks of bleeding, infection, and injury to surrounding structures.  She's willing to proceed.  She understands that she would need a COVID-19 test prior to surgery.  All of her questions have been answered.  Face-to-face time spent with the patient and care providers was 25 minutes, with more than 50% of the time spent counseling, educating, and coordinating care of the patient.     Melvyn Neth, [facility]

## 2021-02-17 NOTE — Patient Instructions (Addendum)
Our surgery scheduler Pamala Hurry will call you within 24-48 hours to get you scheduled. If you have not heard from her after 48 hours, please call our office. You will need to get Covid tested before surgery and have the blue sheet available when she calls to write down important information. Please STOP taking Aspirin after your dose on Friday, March 4th and you may restart Aspirin on March 11th or 12th.   If you have concerns or questions, please feel free to call our office.      Laparoscopic Ventral Hernia Repair, Care After The following information offers guidance on how to care for yourself after your procedure. Your health care provider may also give you more specific instructions. If you have problems or questions, contact your health care provider. What can I expect after the procedure? After the procedure, it is common to have pain, discomfort, or soreness. Follow these instructions at home: Medicines  Take over-the-counter and prescription medicines only as told by your health care provider.  Ask your health care provider if the medicine prescribed to you: ? Requires you to avoid driving or using machinery. ? Can cause constipation. You may need to take these actions to prevent or treat constipation:  Drink enough fluid to keep your urine pale yellow.  Take over-the-counter or prescription medicines.  Eat foods that are high in fiber, such as beans, whole grains, and fresh fruits and vegetables.  Limit foods that are high in fat and processed sugars, such as fried or sweet foods. Incision care  Follow instructions from your health care provider about how to take care of your incisions. Make sure you: ? Wash your hands with soap and water for at least 20 seconds before and after you change your bandage (dressing) or before you touch your abdomen. If soap and water are not available, use hand sanitizer. ? Change your dressing as told by your health care provider. ? Leave stitches  (sutures), skin glue, or adhesive strips in place. These skin closures may need to stay in place for 2 weeks or longer. If adhesive strip edges start to loosen and curl up, you may trim the loose edges. Do not remove adhesive strips completely unless your health care provider tells you to do that.  Check your incision areas every day for signs of infection. Check for: ? More redness, swelling, or pain. ? Fluid or blood. ? Warmth. ? Pus or a bad smell.   Bathing  Do not take baths, swim, or use a hot tub until your health care provider approves. Ask your health care provider if you may take showers. You may only be allowed to take sponge baths.  Keep your dressing dry until your health care provider says it can be removed.   Activity  Rest as told by your health care provider.  Avoid sitting for a long time without moving. Get up to take short walks every 1-2 hours. This is important to improve blood flow and breathing. Ask for help if you feel weak or unsteady.  Do not lift anything that is heavier than 10 lb (4.5 kg), or the limit that you are told, until your health care provider says that it is safe.  If you were given a sedative during the procedure, it can affect you for several hours. Do not drive or operate machinery until your health care provider says that it is safe.  Return to your normal activities as told by your health care provider. Ask your health  care provider what activities are safe for you.   General instructions  Hold a pillow over your abdomen when you cough or sneeze. This helps with pain.  Do not use any products that contain nicotine or tobacco. These products include cigarettes, chewing tobacco, and vaping devices, such as e-cigarettes. These can delay healing after surgery. If you need help quitting, ask your health care provider.  You may be asked to continue to do deep breathing exercises at home. This will help to prevent a lung infection.  Keep all  follow-up visits. This is important.   Contact a health care provider if:  You have any of these signs of infection: ? More redness, swelling, or pain around an incision. ? Fluid or blood coming from an incision. ? Warmth coming from an incision. ? Pus or a bad smell coming from an incision. ? A fever or chills.  You have pain that gets worse or does not get better with medicine.  You have nausea or vomiting.  You have a cough.  You have shortness of breath.  You have not had a bowel movement in 3 days.  You are not able to urinate. Get help right away if you have:  Severe pain in your abdomen.  Persistent nausea and vomiting.  Redness, warmth, or pain in your leg.  Chest pain.  Trouble breathing. These symptoms may represent a serious problem that is an emergency. Do not wait to see if the symptoms will go away. Get medical help right away. Call your local emergency services (911 in the [place] Do not drive yourself to the hospital. Summary  After this procedure, it is common to have pain, discomfort, or soreness.  Follow instructions from your health care provider about how to take care of your incision.  Check your incision area every day for signs of infection. Report any signs of infection to your health care provider.  Keep all follow-up visits. This is important. This information is not intended to replace advice given to you by your health care provider. Make sure you discuss any questions you have with your health care provider. Document Revised: 07/26/2020 Document Reviewed: 07/26/2020 Elsevier Patient Education  2021 [employer].

## 2021-02-17 NOTE — Telephone Encounter (Signed)
Patient has been advised of Pre-Admission date/time, COVID Testing date and Surgery date.  Surgery Date: 02/27/21 Preadmission Testing Date: 02/21/21 (phone 8a-1p) Covid Testing Date: 02/25/21 - patient advised to go to the [place] [facility] between 8a-1p  Patient has been made aware to call [phone], between 1-3:00pm the day before surgery, to find out what time to arrive for surgery.

## 2021-02-17 NOTE — Progress Notes (Signed)
Cardiac Clearance has been received from Dr Zenia Resides office. The patient advised to start 81 mg Aspirin. She has been cleared at Medium risk for surgery. The clearance has been faxed over to pre admit testing.

## 2021-02-18 ENCOUNTER — Telehealth: Payer: Self-pay

## 2021-02-18 NOTE — Telephone Encounter (Signed)
Medical Clearance obtained from Dr.Berglund -patient is optimized for surgery.

## 2021-02-21 ENCOUNTER — Other Ambulatory Visit: Payer: Self-pay

## 2021-02-21 ENCOUNTER — Encounter
Admission: RE | Admit: 2021-02-21 | Discharge: 2021-02-21 | Disposition: A | Discharge status: Home / Self Care | Payer: Medicare Other | Source: Ambulatory Visit | Attending: Surgery | Admitting: Surgery

## 2021-02-21 DIAGNOSIS — Z01818 Encounter for other preprocedural examination: Secondary | ICD-10-CM | POA: Insufficient documentation

## 2021-02-21 NOTE — Pre-Procedure Instructions (Signed)
Glean Hess, MD  Physician  Specialty:  Internal Medicine  Progress Notes    Signed  Encounter Date:  02/12/2021           Signed      Expand All Collapse All      Date:  02/12/2021   Name:  Shannyn Jankowiak [facility]                    DOB:  01/18/1946                   MRN:  [id number]   Chief Complaint: surgery clearance Planning ventral hernia repair with Dr. Hampton Abbot under general anesthesia.  Should be minimally invasive as long as it does not enlarge further.  Hypertension This is a chronic problem. The problem is controlled. Associated symptoms include shortness of breath. Pertinent negatives include no chest pain, headaches or palpitations. Hypertensive end-organ damage includes CAD/MI.  CAD - hx of PTCA and stent to distal RCA in 2015.  Previously on aspirin and beta blockers.  She has a cardiologist but has been undergoing treatment for lung cancer over the past 2 years.  She denies chest pain, palpitations, edema.  She has chronic mild SOB with no recent chest infections.  Recent Labs       Lab Results  Component Value Date   CREATININE 0.74 02/10/2021   BUN 16 02/10/2021   NA 134 (L) 02/10/2021   K 3.6 02/10/2021   CL 101 02/10/2021   CO2 22 02/10/2021     Recent Labs       Lab Results  Component Value Date   CHOL 162 09/19/2018   HDL 51 09/19/2018   LDLCALC 89 09/19/2018   TRIG 108 09/19/2018   CHOLHDL 3.2 09/19/2018     Recent Labs       Lab Results  Component Value Date   TSH 2.129 02/10/2021     Recent Labs       Lab Results  Component Value Date   HGBA1C 5.3 09/19/2018     Recent Labs       Lab Results  Component Value Date   WBC 5.5 02/10/2021   HGB 12.1 02/10/2021   HCT 35.3 (L) 02/10/2021   MCV 89.1 02/10/2021   PLT 171 02/10/2021     Recent Labs       Lab Results  Component Value Date   ALT 14 02/10/2021   AST 18 02/10/2021   ALKPHOS 45 02/10/2021   BILITOT 0.6 02/10/2021        Review of Systems  Constitutional: Positive for fatigue. Negative for chills, diaphoresis and fever.  Respiratory: Positive for shortness of breath. Negative for cough, chest tightness and wheezing.   Cardiovascular: Negative for chest pain, palpitations and leg swelling.  Gastrointestinal: Positive for abdominal distention (with intermittent pain and firmness).  Musculoskeletal: Positive for gait problem. Negative for arthralgias.  Neurological: Positive for light-headedness. Negative for dizziness and headaches.        Patient Active Problem List   Diagnosis Date Noted  . 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 Diarrhea 12/05/2020  . Osteopenia 08/12/2020  . Ventral hernia without obstruction or gangrene 08/12/2020  . Thoracic compression fracture, closed, initial encounter ([place]) 01/22/2020  . Frequent falls 01/22/2020  . Encounter for antineoplastic immunotherapy 09/14/2019  . Memory loss 09/14/2019  . Other fatigue 09/14/2019  . Cancer of hilus of left lung ([place]) 06/08/2019  . Dehydration 01/30/2019  .  Acute pyelonephritis 01/24/2019  . Anemia due to antineoplastic chemotherapy 01/09/2019  . Encounter for antineoplastic chemotherapy 11/02/2018  . Cough 10/28/2018  . Dysphagia 10/28/2018  . Decrease in appetite 10/28/2018  . Goals of care, counseling/discussion 10/28/2018  . Small cell lung cancer ([place]) 10/27/2018  . Grade I diastolic dysfunction 95/08/3266  . Localized edema 08/29/2018  . Cholecystitis 08/13/2018  . COPD with exacerbation ([place]) 08/11/2018  . Osteopenia determined by x-ray 05/19/2018  . Prediabetes 02/08/2018  . Hx of fracture of radius 08/18/2016  . Vitamin D deficiency 01/07/2016  . Hyperlipidemia 01/02/2016  . Hypertension 01/02/2016  . Chronic pain 01/02/2016  . Obesity (BMI 30.0-34.9) 01/02/2016  . 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 IBS (irritable bowel syndrome) 01/02/2016  . FH: colon  cancer 01/02/2016  . DDD (degenerative disc disease), cervical 01/02/2016  . Primary osteoarthritis of left knee 01/02/2016  . Tobacco use disorder 01/02/2016    No Known Allergies       Past Surgical History:  Procedure Laterality Date  . APPENDECTOMY     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  . BLADDER NECK SUSPENSION    . CHOLECYSTECTOMY N/A 08/14/2018   Procedure: LAPAROSCOPIC CHOLECYSTECTOMY;  Surgeon: Jules Husbands, MD;  Location: [facility] ORS;  Service: General;  Laterality: N/A;  . CHOLECYSTECTOMY  11/2018  . CORONARY ANGIOPLASTY  09/29/2014  . CORONARY STENT PLACEMENT    . ENDOBRONCHIAL ULTRASOUND N/A 10/21/2018   Procedure: ENDOBRONCHIAL ULTRASOUND;  Surgeon: Laverle Hobby, MD;  Location: [facility] ORS;  Service: Pulmonary;  Laterality: N/A;  . HERNIA REPAIR  2012  . IR FLUORO GUIDE CV LINE RIGHT  12/19/2018  . KYPHOPLASTY N/A 03/01/2020   Procedure: T7 KYPHOPLASTY;  Surgeon: Hessie Knows, MD;  Location: [facility] ORS;  Service: Orthopedics;  Laterality: N/A;  . LEFT HEART CATHETERIZATION WITH CORONARY ANGIOGRAM N/A 09/29/2014   Procedure: LEFT HEART CATHETERIZATION WITH CORONARY ANGIOGRAM;  Surgeon: Clent Demark, MD;  Location: [facility] CATH LAB;  Service: Cardiovascular;  Laterality: N/A;  . PORTA CATH INSERTION N/A 01/02/2019   Procedure: PORTA CATH INSERTION;  Surgeon: Algernon Huxley, MD;  Location: [facility] CV LAB;  Service: Cardiovascular;  Laterality: N/A;  . PORTACATH PLACEMENT Right 10/28/2018   Procedure: INSERTION PORT-A-CATH;  Surgeon: Jules Husbands, MD;  Location: [facility] ORS;  Service: General;  Laterality: Right;    Social History        Tobacco Use  . Smoking status: Former Smoker    Packs/day: 1.00    Years: 39.00    Pack years: 39.00    Types: Cigarettes    Start date: 12/21/1978    Quit date: 10/16/2018    Years since quitting: 2.3  . Smokeless tobacco: Never Used  Vaping Use  . Vaping Use: Never used  Substance Use Topics  .  Alcohol use: Yes    Alcohol/week: 0.0 standard drinks    Comment: occassional - approx 1 every 2 weeks   . Drug use: No     Medication list has been reviewed and updated.  Active Medications      Current Meds  Medication Sig  . acetaminophen (TYLENOL) 500 MG tablet Take 500 mg by mouth 2 (two) times daily as needed for moderate pain or headache.  Huey Bienenstock (TECENTRIQ IV) Inject 1 Dose into the vein every 30 (thirty) days.  . Cholecalciferol (DIALYVITE VITAMIN D 5000) 125 MCG (5000 UT) capsule Take 5,000 Units by mouth daily.  . diphenhydrAMINE-zinc acetate (BENADRYL EXTRA STRENGTH)  cream Apply 1 application topically 3 (three) times daily as needed for itching.  [name] Kitchen HYDROcodone-acetaminophen (NORCO/VICODIN) 5-325 MG tablet Take 1 tablet by mouth every 6 (six) hours as needed for moderate pain.  [name] Kitchen lidocaine-prilocaine (EMLA) cream Apply 1 application topically daily as needed (port access).  [name] Kitchen loperamide (IMODIUM A-D) 2 MG tablet Take 2 mg by mouth 4 (four) times daily as needed for diarrhea or loose stools.   . nitroGLYCERIN (NITROSTAT) 0.4 MG SL tablet Place 1 tablet (0.4 mg total) under the tongue every 5 (five) minutes x 3 doses as needed for chest pain.  [name] Kitchen omeprazole (PRILOSEC) 40 MG capsule Take 1 capsule (40 mg total) by mouth daily.  . polyethylene glycol (MIRALAX / GLYCOLAX) packet Take 17 g by mouth daily as needed for mild constipation.  . senna (SENOKOT) 8.6 MG TABS tablet Take 2 tablets (17.2 mg total) by mouth daily.  . sertraline (ZOLOFT) 50 MG tablet Take 1.5 tablets (75 mg total) by mouth daily.  . vitamin B-12 (CYANOCOBALAMIN) 1000 MCG tablet Take 1,000 mcg by mouth daily.  . Zoledronic Acid (ZOMETA IV) Inject 1 Dose into the vein every 30 (thirty) days.      PHQ 2/9 Scores 02/12/2021 09/19/2018 02/08/2018 01/02/2016  PHQ - 2 Score 2 0 0 0  PHQ- 9 Score 4 - - -    GAD 7 : Generalized Anxiety Score 02/12/2021  Nervous, Anxious, on Edge 0  Control/stop  worrying 0  Worry too much - different things 0  Trouble relaxing 0  Restless 0  Easily annoyed or irritable 0  Afraid - awful might happen 0  Total GAD 7 Score 0  Anxiety Difficulty Not difficult at all       BP Readings from Last 3 Encounters:  02/12/21 100/64  02/10/21 102/74  02/07/21 116/78    Physical Exam Constitutional:      Appearance: Normal appearance.  Cardiovascular:     Rate and Rhythm: Normal rate and regular rhythm. Occasional extrasystoles are present.    Heart sounds: No murmur heard.   Pulmonary:     Effort: Pulmonary effort is normal.     Breath sounds: Normal breath sounds. No wheezing.  Abdominal:     Palpations: Abdomen is soft.     Hernia: A hernia (large soft ventral hernia) is present.  Musculoskeletal:     Cervical back: Normal range of motion.     Right lower leg: No edema.     Left lower leg: No edema.  Lymphadenopathy:     Cervical: No cervical adenopathy.  Neurological:     Mental Status: She is alert.  Psychiatric:        Mood and Affect: Mood normal.        Behavior: Behavior normal.        Wt Readings from Last 3 Encounters:  02/12/21 143 lb (64.9 kg)  02/10/21 150 lb (68 kg)  02/07/21 145 lb (65.8 kg)    BP 100/64   Pulse 89   Ht 5\' 6"  (1.676 m)   Wt 143 lb (64.9 kg)   SpO2 95%   BMI 23.08 kg/m   Assessment and Plan: 1. Atherosclerosis of native coronary artery of native heart with angina pectoris ([place]) S/p PTCA and stent; not on aspirin or beta blockers or statins Recommend Cardiology visit prior to elective surgery for clearance  2. Thrombocytopenia ([place]) Has been stable around 160K and monitored regularly by Oncology  3. Lung cancer metastatic to bone [facility]) Now on maintenance  treatment with Tecentriq IV every 30 days  4. Ventral hernia without obstruction or gangrene Cleared from my standpoint as long as Cardiology concur Form indicating the same sent to Dr. Hampton Abbot   Partially dictated  using Dragon software. Any errors are unintentional.  Halina Maidens, MD [facility]  02/12/2021            Electronically signed by Glean Hess, MD at 02/12/2021 12:14 PM  Office Visit on 02/12/2021      Office Visit on 02/12/2021         Detailed Report       Note shared with patient    Additional Documentation  Vitals:  BP 100/64   Pulse 89   Ht 5\' 6"  (1.676 m)   Wt 64.9 kg   SpO2 95%   BMI 23.08 kg/m   BSA 1.74 m   Pain [place] 0-No pain       More Vitals   Flowsheets:  Method of Visit,   Falls,   PHQ-9 Depression Scale,   GAD-7: Generalized Anxiety Disorder 7-Item Scale,   Anthropometrics,   NEWS,   MEWS Score     Encounter Info:  Billing Info,   History,   Allergies,   Detailed Report       Orders Placed   None   Medication Changes     None    Medication List   Visit Diagnoses     Atherosclerosis of native coronary artery of native heart with angina pectoris (HCC)     Thrombocytopenia ([place])     Lung cancer metastatic to bone ([place])     Ventral hernia without obstruction or gangrene    Problem List

## 2021-02-21 NOTE — Patient Instructions (Addendum)
Your procedure is scheduled on:02-27-21 THURSDAY Report to the Registration Desk on the 1st floor of the [facility]-Then proceed to the 2nd floor Surgery Desk in the [facility] To find out your arrival time, please call [phone] between 1PM - 3PM on:02-26-21 WEDNESDAY  REMEMBER: Instructions that are not followed completely may result in serious medical risk, up to and including death; or upon the discretion of your surgeon and anesthesiologist your surgery may need to be rescheduled.  Do not eat food after midnight the night before surgery.  No gum chewing, lozengers or hard candies.  You may however, drink CLEAR liquids up to 2 hours before you are scheduled to arrive for your surgery. Do not drink anything within 2 hours of your scheduled arrival time.  Clear liquids include: - water  - apple juice without pulp - gatorade - black coffee or tea (Do NOT add milk or creamers to the coffee or tea) Do NOT drink anything that is not on this list.  TAKE THESE MEDICATIONS THE MORNING OF SURGERY WITH A SIP OF WATER: -ZOLOFT (SERTRALINE) -PRILOSEC (OMEPRAZOLE)-take one the night before and one on the morning of surgery - helps to prevent nausea after surgery.)  One week prior to surgery: Stop Anti-inflammatories (NSAIDS) such as Advil, Aleve, Ibuprofen, Motrin, Naproxen, Naprosyn and Aspirin based products such as Excedrin, Goodys Powder, BC Powder-OK TO TAKE TYLENOL/HYDROCODONE IF NEEDED  Stop ANY OVER THE COUNTER supplements until after surgery-YOU MAY CONTINUE YOUR CALCIUM, VITAMIN D, MAGNESIUM AND VITAMIN B12  No Alcohol for 24 hours before or after surgery.  No Smoking including e-cigarettes for 24 hours prior to surgery.  No chewable tobacco products for at least 6 hours prior to surgery.  No nicotine patches on the day of surgery.  Do not use any "recreational" drugs for at least a week prior to your surgery.  Please be advised that the combination of cocaine and anesthesia  may have negative outcomes, up to and including death. If you test positive for cocaine, your surgery will be cancelled.  On the morning of surgery brush your teeth with toothpaste and water, you may rinse your mouth with mouthwash if you wish. Do not swallow any toothpaste or mouthwash.  Do not wear jewelry, make-up, hairpins, clips or nail polish.  Do not wear lotions, powders, or perfumes.   Do not shave body from the neck down 48 hours prior to surgery just in case you cut yourself which could leave a site for infection.  Also, freshly shaved skin may become irritated if using the CHG soap.  Contact lenses, hearing aids and dentures may not be worn into surgery.  Do not bring valuables to the hospital. [facility] is not responsible for any missing/lost belongings or valuables.   Use CHG Soap as directed on instruction sheet.  Notify your doctor if there is any change in your medical condition (cold, fever, infection).  Wear comfortable clothing (specific to your surgery type) to the hospital.  Plan for stool softeners for home use; pain medications have a tendency to cause constipation. You can also help prevent constipation by eating foods high in fiber such as fruits and vegetables and drinking plenty of fluids as your diet allows.  After surgery, you can help prevent lung complications by doing breathing exercises.  Take deep breaths and cough every 1-2 hours. Your doctor may order a device called an Incentive Spirometer to help you take deep breaths. When coughing or sneezing, hold a pillow firmly against  your incision with both hands. This is called "splinting." Doing this helps protect your incision. It also decreases belly discomfort.  If you are being admitted to the hospital overnight, leave your suitcase in the car. After surgery it may be brought to your room.  If you are being discharged the day of surgery, you will not be allowed to drive home. You will need a  responsible adult (18 years or older) to drive you home and stay with you that night.   If you are taking public transportation, you will need to have a responsible adult (18 years or older) with you. Please confirm with your physician that it is acceptable to use public transportation.   Please call the [place] Dept. at [phone] if you have any questions about these instructions.  Surgery Visitation Policy:  Patients undergoing a surgery or procedure may have one family member or support person with them as long as that person is not COVID-19 positive or experiencing its symptoms.  That person may remain in the waiting area during the procedure.  Inpatient Visitation:    Visiting hours are 7 a.m. to 8 p.m. Inpatients will be allowed two visitors daily. The visitors may change each day during the patient's stay. No visitors under the age of 73. Any visitor under the age of 33 must be accompanied by an adult. The visitor must pass COVID-19 screenings, use hand sanitizer when entering and exiting the patient's room and wear a mask at all times, including in the patient's room. Patients must also wear a mask when staff or their visitor are in the room. Masking is required regardless of vaccination status.

## 2021-02-25 ENCOUNTER — Other Ambulatory Visit
Admission: RE | Admit: 2021-02-25 | Discharge: 2021-02-25 | Disposition: A | Discharge status: Home / Self Care | Payer: Medicare Other | Source: Ambulatory Visit | Attending: Surgery | Admitting: Surgery

## 2021-02-25 ENCOUNTER — Other Ambulatory Visit: Payer: Self-pay

## 2021-02-25 DIAGNOSIS — Z01812 Encounter for preprocedural laboratory examination: Secondary | ICD-10-CM | POA: Diagnosis not present

## 2021-02-25 DIAGNOSIS — H25093 Other age-related incipient cataract, bilateral: Secondary | ICD-10-CM | POA: Diagnosis not present

## 2021-02-25 DIAGNOSIS — Z20822 Contact with and (suspected) exposure to covid-19: Secondary | ICD-10-CM | POA: Diagnosis not present

## 2021-02-25 LAB — SARS CORONAVIRUS 2 (TAT 6-24 HRS): SARS Coronavirus 2: NEGATIVE

## 2021-02-26 ENCOUNTER — Ambulatory Visit
Admission: RE | Admit: 2021-02-26 | Discharge: 2021-02-26 | Disposition: A | Discharge status: Home / Self Care | Payer: Medicare Other | Source: Ambulatory Visit | Attending: Oncology | Admitting: Oncology

## 2021-02-26 DIAGNOSIS — Q8909 Congenital malformations of spleen: Secondary | ICD-10-CM | POA: Diagnosis not present

## 2021-02-26 DIAGNOSIS — C3490 Malignant neoplasm of unspecified part of unspecified bronchus or lung: Secondary | ICD-10-CM | POA: Diagnosis not present

## 2021-02-26 DIAGNOSIS — I77810 Thoracic aortic ectasia: Secondary | ICD-10-CM | POA: Diagnosis not present

## 2021-02-26 DIAGNOSIS — J439 Emphysema, unspecified: Secondary | ICD-10-CM | POA: Diagnosis not present

## 2021-02-26 DIAGNOSIS — M810 Age-related osteoporosis without current pathological fracture: Secondary | ICD-10-CM | POA: Diagnosis not present

## 2021-02-26 DIAGNOSIS — I2510 Atherosclerotic heart disease of native coronary artery without angina pectoris: Secondary | ICD-10-CM | POA: Diagnosis not present

## 2021-02-26 DIAGNOSIS — I700 Atherosclerosis of aorta: Secondary | ICD-10-CM | POA: Diagnosis not present

## 2021-02-26 DIAGNOSIS — S2220XA Unspecified fracture of sternum, initial encounter for closed fracture: Secondary | ICD-10-CM | POA: Diagnosis not present

## 2021-02-26 MED ORDER — IOHEXOL 300 MG/ML  SOLN
100.0000 mL | Freq: Once | INTRAMUSCULAR | Status: AC | PRN
  Administered 2021-02-26: 100 mL via INTRAVENOUS

## 2021-02-27 ENCOUNTER — Other Ambulatory Visit: Payer: Self-pay

## 2021-02-27 ENCOUNTER — Ambulatory Visit: Payer: Medicare Other | Admitting: Certified Registered"

## 2021-02-27 ENCOUNTER — Encounter: Payer: Self-pay | Admitting: Surgery

## 2021-02-27 ENCOUNTER — Encounter
Admission: RE | Disposition: A | Discharge status: Home / Self Care | Payer: Self-pay | Source: Home / Self Care | Attending: Surgery

## 2021-02-27 ENCOUNTER — Ambulatory Visit
Admission: RE | Admit: 2021-02-27 | Discharge: 2021-02-27 | Disposition: A | Discharge status: Home / Self Care | Payer: Medicare Other | Attending: Surgery | Admitting: Surgery

## 2021-02-27 DIAGNOSIS — Z87891 Personal history of nicotine dependence: Secondary | ICD-10-CM | POA: Insufficient documentation

## 2021-02-27 DIAGNOSIS — I252 Old myocardial infarction: Secondary | ICD-10-CM | POA: Insufficient documentation

## 2021-02-27 DIAGNOSIS — K432 Incisional hernia without obstruction or gangrene: Secondary | ICD-10-CM

## 2021-02-27 DIAGNOSIS — Z955 Presence of coronary angioplasty implant and graft: Secondary | ICD-10-CM | POA: Diagnosis not present

## 2021-02-27 DIAGNOSIS — I25118 Atherosclerotic heart disease of native coronary artery with other forms of angina pectoris: Secondary | ICD-10-CM | POA: Diagnosis not present

## 2021-02-27 DIAGNOSIS — K219 Gastro-esophageal reflux disease without esophagitis: Secondary | ICD-10-CM | POA: Diagnosis not present

## 2021-02-27 DIAGNOSIS — Z85118 Personal history of other malignant neoplasm of bronchus and lung: Secondary | ICD-10-CM | POA: Diagnosis not present

## 2021-02-27 DIAGNOSIS — D696 Thrombocytopenia, unspecified: Secondary | ICD-10-CM | POA: Diagnosis not present

## 2021-02-27 DIAGNOSIS — I10 Essential (primary) hypertension: Secondary | ICD-10-CM | POA: Diagnosis not present

## 2021-02-27 DIAGNOSIS — Z79899 Other long term (current) drug therapy: Secondary | ICD-10-CM | POA: Diagnosis not present

## 2021-02-27 DIAGNOSIS — E7800 Pure hypercholesterolemia, unspecified: Secondary | ICD-10-CM | POA: Diagnosis not present

## 2021-02-27 HISTORY — PX: XI ROBOTIC ASSISTED VENTRAL HERNIA: SHX6789

## 2021-02-27 SURGERY — REPAIR, HERNIA, VENTRAL, ROBOT-ASSISTED
Anesthesia: General

## 2021-02-27 MED ORDER — ACETAMINOPHEN 325 MG PO TABS
ORAL_TABLET | ORAL | Status: AC
  Filled 2021-02-27: qty 2

## 2021-02-27 MED ORDER — GABAPENTIN 100 MG PO CAPS
ORAL_CAPSULE | ORAL | Status: AC
  Filled 2021-02-27: qty 1

## 2021-02-27 MED ORDER — FENTANYL CITRATE (PF) 100 MCG/2ML IJ SOLN
INTRAMUSCULAR | Status: AC
  Filled 2021-02-27: qty 4

## 2021-02-27 MED ORDER — ORAL CARE MOUTH RINSE: 15.0000 mL | Freq: Once | OROMUCOSAL | Status: AC

## 2021-02-27 MED ORDER — SUGAMMADEX SODIUM 200 MG/2ML IV SOLN
INTRAVENOUS | Status: DC | PRN
  Administered 2021-02-27: 200 mg via INTRAVENOUS

## 2021-02-27 MED ORDER — BUPIVACAINE-EPINEPHRINE 0.25% -1:200000 IJ SOLN
INTRAMUSCULAR | Status: DC | PRN
  Administered 2021-02-27: 30 mL

## 2021-02-27 MED ORDER — ACETAMINOPHEN 500 MG PO TABS
1000.0000 mg | ORAL_TABLET | ORAL | Status: AC
  Administered 2021-02-27: 1000 mg via ORAL

## 2021-02-27 MED ORDER — SODIUM CHLORIDE 0.9 % IV SOLN
INTRAVENOUS | Status: DC | PRN
  Administered 2021-02-27: 50 ug/min via INTRAVENOUS

## 2021-02-27 MED ORDER — BUPIVACAINE LIPOSOME 1.3 % IJ SUSP
INTRAMUSCULAR | Status: AC
  Filled 2021-02-27: qty 20

## 2021-02-27 MED ORDER — LIDOCAINE HCL (CARDIAC) PF 100 MG/5ML IV SOSY
PREFILLED_SYRINGE | INTRAVENOUS | Status: DC | PRN
  Administered 2021-02-27: 50 mg via INTRAVENOUS

## 2021-02-27 MED ORDER — ONDANSETRON HCL 4 MG/2ML IJ SOLN: 4.0000 mg | Freq: Once | INTRAMUSCULAR | Status: DC | PRN

## 2021-02-27 MED ORDER — ROCURONIUM BROMIDE 100 MG/10ML IV SOLN
INTRAVENOUS | Status: DC | PRN
  Administered 2021-02-27: 40 mg via INTRAVENOUS
  Administered 2021-02-27: 30 mg via INTRAVENOUS
  Administered 2021-02-27: 50 mg via INTRAVENOUS

## 2021-02-27 MED ORDER — ACETAMINOPHEN 325 MG PO TABS
325.0000 mg | ORAL_TABLET | ORAL | Status: DC | PRN
  Administered 2021-02-27: 650 mg via ORAL

## 2021-02-27 MED ORDER — ACETAMINOPHEN 500 MG PO TABS
ORAL_TABLET | ORAL | Status: AC
  Filled 2021-02-27: qty 2

## 2021-02-27 MED ORDER — ACETAMINOPHEN 160 MG/5ML PO SOLN
325.0000 mg | ORAL | Status: DC | PRN
  Filled 2021-02-27: qty 20.3

## 2021-02-27 MED ORDER — OXYCODONE HCL 5 MG PO TABS
ORAL_TABLET | ORAL | Status: AC
  Filled 2021-02-27: qty 1

## 2021-02-27 MED ORDER — FENTANYL CITRATE (PF) 100 MCG/2ML IJ SOLN
25.0000 ug | INTRAMUSCULAR | Status: DC | PRN
  Administered 2021-02-27 (×2): 25 ug via INTRAVENOUS

## 2021-02-27 MED ORDER — SEVOFLURANE IN SOLN
RESPIRATORY_TRACT | Status: AC
  Filled 2021-02-27: qty 250

## 2021-02-27 MED ORDER — ONDANSETRON HCL 4 MG/2ML IJ SOLN
INTRAMUSCULAR | Status: DC | PRN
  Administered 2021-02-27 (×2): 4 mg via INTRAVENOUS

## 2021-02-27 MED ORDER — FENTANYL CITRATE (PF) 100 MCG/2ML IJ SOLN
INTRAMUSCULAR | Status: DC | PRN
  Administered 2021-02-27 (×3): 50 ug via INTRAVENOUS

## 2021-02-27 MED ORDER — DEXAMETHASONE SODIUM PHOSPHATE 10 MG/ML IJ SOLN
INTRAMUSCULAR | Status: DC | PRN
  Administered 2021-02-27: 10 mg via INTRAVENOUS

## 2021-02-27 MED ORDER — CHLORHEXIDINE GLUCONATE CLOTH 2 % EX PADS
6.0000 | MEDICATED_PAD | Freq: Once | CUTANEOUS | Status: AC
  Administered 2021-02-27: 6 via TOPICAL

## 2021-02-27 MED ORDER — IBUPROFEN 600 MG PO TABS: 600.0000 mg | ORAL_TABLET | Freq: Three times a day (TID) | ORAL | 1 refills | Status: DC | PRN

## 2021-02-27 MED ORDER — PROPOFOL 10 MG/ML IV BOLUS
INTRAVENOUS | Status: DC | PRN
  Administered 2021-02-27: 100 mg via INTRAVENOUS

## 2021-02-27 MED ORDER — CHLORHEXIDINE GLUCONATE 0.12 % MT SOLN
15.0000 mL | Freq: Once | OROMUCOSAL | Status: AC
  Administered 2021-02-27: 15 mL via OROMUCOSAL

## 2021-02-27 MED ORDER — GABAPENTIN 300 MG PO CAPS
ORAL_CAPSULE | ORAL | Status: AC
  Administered 2021-02-27: 100 mg via ORAL
  Filled 2021-02-27: qty 1

## 2021-02-27 MED ORDER — BUPIVACAINE LIPOSOME 1.3 % IJ SUSP
INTRAMUSCULAR | Status: DC | PRN
  Administered 2021-02-27: 20 mL

## 2021-02-27 MED ORDER — DROPERIDOL 2.5 MG/ML IJ SOLN
0.6250 mg | Freq: Once | INTRAMUSCULAR | Status: DC | PRN
  Filled 2021-02-27: qty 2

## 2021-02-27 MED ORDER — GABAPENTIN 100 MG PO CAPS: 100.0000 mg | ORAL_CAPSULE | ORAL | Status: AC

## 2021-02-27 MED ORDER — BUPIVACAINE LIPOSOME 1.3 % IJ SUSP: 20.0000 mL | Freq: Once | INTRAMUSCULAR | Status: DC

## 2021-02-27 MED ORDER — EPHEDRINE SULFATE 50 MG/ML IJ SOLN
INTRAMUSCULAR | Status: DC | PRN
  Administered 2021-02-27: 10 mg via INTRAVENOUS

## 2021-02-27 MED ORDER — FENTANYL CITRATE (PF) 100 MCG/2ML IJ SOLN
INTRAMUSCULAR | Status: AC
  Administered 2021-02-27: 25 ug via INTRAVENOUS
  Filled 2021-02-27: qty 2

## 2021-02-27 MED ORDER — OXYCODONE HCL 5 MG PO TABS
5.0000 mg | ORAL_TABLET | Freq: Once | ORAL | Status: AC
Start: 2021-02-27 — End: 2021-02-27
  Administered 2021-02-27: 5 mg via ORAL

## 2021-02-27 MED ORDER — CEFAZOLIN SODIUM-DEXTROSE 2-4 GM/100ML-% IV SOLN
INTRAVENOUS | Status: AC
  Filled 2021-02-27: qty 100

## 2021-02-27 MED ORDER — CEFAZOLIN SODIUM-DEXTROSE 2-4 GM/100ML-% IV SOLN
2.0000 g | INTRAVENOUS | Status: AC
  Administered 2021-02-27: 2 g via INTRAVENOUS

## 2021-02-27 MED ORDER — OXYCODONE HCL 5 MG PO TABS: 5.0000 mg | ORAL_TABLET | Freq: Four times a day (QID) | ORAL | 0 refills | Status: DC | PRN

## 2021-02-27 MED ORDER — BUPIVACAINE-EPINEPHRINE (PF) 0.25% -1:200000 IJ SOLN
INTRAMUSCULAR | Status: AC
  Filled 2021-02-27: qty 30

## 2021-02-27 MED ORDER — LACTATED RINGERS IV SOLN: INTRAVENOUS | Status: DC

## 2021-02-27 MED ORDER — ACETAMINOPHEN 500 MG PO TABS: 1000.0000 mg | ORAL_TABLET | Freq: Four times a day (QID) | ORAL | Status: AC | PRN

## 2021-02-27 SURGICAL SUPPLY — 59 items
ADH SKN CLS APL DERMABOND .7 (GAUZE/BANDAGES/DRESSINGS) ×1
APL PRP STRL LF DISP 70% ISPRP (MISCELLANEOUS) ×1
BINDER ABDOMINAL  9 SM 30-45 (SOFTGOODS) ×1
BINDER ABDOMINAL 9 SM 30-45 (SOFTGOODS) ×1 IMPLANT
BLADE SURG SZ11 CARB STEEL (BLADE) ×2 IMPLANT
CANNULA REDUC XI 12-8 STAPL (CANNULA) ×1
CANNULA REDUCER 12-8 DVNC XI (CANNULA) ×1 IMPLANT
CHLORAPREP W/TINT 26 (MISCELLANEOUS) ×2 IMPLANT
COVER TIP SHEARS 8 DVNC (MISCELLANEOUS) ×1 IMPLANT
COVER TIP SHEARS 8MM DA VINCI (MISCELLANEOUS) ×1
COVER WAND RF STERILE (DRAPES) ×2 IMPLANT
DEFOGGER SCOPE WARMER CLEARIFY (MISCELLANEOUS) ×2 IMPLANT
DERMABOND ADVANCED (GAUZE/BANDAGES/DRESSINGS) ×1
DERMABOND ADVANCED .7 DNX12 (GAUZE/BANDAGES/DRESSINGS) ×1 IMPLANT
DRAPE ARM DVNC X/XI (DISPOSABLE) ×4 IMPLANT
DRAPE COLUMN DVNC XI (DISPOSABLE) ×1 IMPLANT
DRAPE DA VINCI XI ARM (DISPOSABLE) ×4
DRAPE DA VINCI XI COLUMN (DISPOSABLE) ×1
ELECT CAUTERY BLADE 6.4 (BLADE) ×2 IMPLANT
ELECT REM PT RETURN 9FT ADLT (ELECTROSURGICAL) ×2
ELECTRODE REM PT RTRN 9FT ADLT (ELECTROSURGICAL) ×1 IMPLANT
GLOVE SURG SYN 7.0 (GLOVE) ×4 IMPLANT
GLOVE SURG SYN 7.5  E (GLOVE) ×2
GLOVE SURG SYN 7.5 E (GLOVE) ×2 IMPLANT
GOWN STRL REUS W/ TWL LRG LVL3 (GOWN DISPOSABLE) ×3 IMPLANT
GOWN STRL REUS W/TWL LRG LVL3 (GOWN DISPOSABLE) ×6
GRASPER SUT TROCAR 14GX15 (MISCELLANEOUS) ×2 IMPLANT
IRRIGATION STRYKERFLOW (MISCELLANEOUS) IMPLANT
IRRIGATOR STRYKERFLOW (MISCELLANEOUS)
IV NS 1000ML (IV SOLUTION)
IV NS 1000ML BAXH (IV SOLUTION) IMPLANT
KIT PINK PAD W/HEAD ARE REST (MISCELLANEOUS) ×2
KIT PINK PAD W/HEAD ARM REST (MISCELLANEOUS) ×1 IMPLANT
LABEL OR SOLS (LABEL) ×2 IMPLANT
MANIFOLD NEPTUNE II (INSTRUMENTS) ×2 IMPLANT
MESH VENT LT ST 10.2X15.2CM EL (Mesh General) ×2 IMPLANT
NEEDLE HYPO 22GX1.5 SAFETY (NEEDLE) ×2 IMPLANT
NEEDLE INSUFFLATION 14GA 120MM (NEEDLE) ×2 IMPLANT
OBTURATOR OPTICAL STANDARD 8MM (TROCAR) ×1
OBTURATOR OPTICAL STND 8 DVNC (TROCAR) ×1
OBTURATOR OPTICALSTD 8 DVNC (TROCAR) ×1 IMPLANT
PACK LAP CHOLECYSTECTOMY (MISCELLANEOUS) ×2 IMPLANT
PENCIL ELECTRO HAND CTR (MISCELLANEOUS) ×2 IMPLANT
SEAL CANN UNIV 5-8 DVNC XI (MISCELLANEOUS) ×2 IMPLANT
SEAL XI 5MM-8MM UNIVERSAL (MISCELLANEOUS) ×2
SET TUBE SMOKE EVAC HIGH FLOW (TUBING) ×2 IMPLANT
SOLUTION ELECTROLUBE (MISCELLANEOUS) ×2 IMPLANT
SPONGE LAP 18X18 RF (DISPOSABLE) ×2 IMPLANT
STAPLER CANNULA SEAL DVNC XI (STAPLE) ×1 IMPLANT
STAPLER CANNULA SEAL XI (STAPLE) ×1
SUT MNCRL 4-0 (SUTURE) ×2
SUT MNCRL 4-0 27XMFL (SUTURE) ×1
SUT STRATAFIX 0 PDS+ CT-2 23 (SUTURE) ×4
SUT STRATAFIX PDS 30 CT-1 (SUTURE) ×2 IMPLANT
SUT VICRYL 0 AB UR-6 (SUTURE) ×4 IMPLANT
SUT VLOC 90 2/L VL 12 GS22 (SUTURE) ×6 IMPLANT
SUTURE MNCRL 4-0 27XMF (SUTURE) ×1 IMPLANT
SUTURE STRATFX 0 PDS+ CT-2 23 (SUTURE) ×2 IMPLANT
TRAY FOLEY SLVR 16FR LF STAT (SET/KITS/TRAYS/PACK) ×2 IMPLANT

## 2021-02-27 NOTE — Interval H&P Note (Signed)
History and Physical Interval Note:  02/27/2021 12:05 PM  Kristi [name] [facility]  has presented today for surgery, with the diagnosis of Incisional hernia.  The various methods of treatment have been discussed with the patient and family. After consideration of risks, benefits and other options for treatment, the patient has consented to  Procedure(s): XI ROBOTIC ASSISTED VENTRAL HERNIA (incisional) (N/A) as a surgical intervention.  The patient's history has been reviewed, patient examined, no change in status, stable for surgery.  I have reviewed the patient's chart and labs.  Questions were answered to the patient's satisfaction.     Remiel Corti

## 2021-02-27 NOTE — Anesthesia Preprocedure Evaluation (Addendum)
Anesthesia Evaluation  Patient identified by MRN, date of birth, ID band Patient awake    Reviewed: Allergy & Precautions, H&P , NPO status , reviewed documented beta blocker date and time   Airway Mallampati: III  TM Distance: >3 FB Neck ROM: limited    Dental  (+) Upper Dentures, Lower Dentures   Pulmonary pneumonia, resolved, former smoker,    Pulmonary exam normal        Cardiovascular hypertension, + angina + CAD and + Past MI  Normal cardiovascular exam  07/2018/ ECHO Study Conclusions   - Left ventricle: The cavity size was normal. Systolic function was  normal. The estimated ejection fraction was in the range of 55%  to 60%. Wall motion was normal; there were no regional wall  motion abnormalities. Doppler parameters are consistent with  abnormal left ventricular relaxation (grade 1 diastolic  dysfunction).  - Pulmonary arteries: Systolic pressure could not be accurately  estimated.   Cardiac and Medical Clearance Noted.   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, old No date: Pneumonia No date: Small cell lung cancer ([place]) 10/27/2018: 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 [facility]), CT showed bronchial narrowing at the time, resolved in 2021 on repeat scan Past Surgical History: No date: APPENDECTOMY     Comment:  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 No date: BLADDER NECK SUSPENSION 08/14/2018: CHOLECYSTECTOMY; N/A     Comment:  Procedure: LAPAROSCOPIC CHOLECYSTECTOMY;  Surgeon:                Jules Husbands, MD;  Location: [facility] ORS;  Service:               General;  Laterality: N/A; 11/2018: CHOLECYSTECTOMY 09/29/2014: CORONARY ANGIOPLASTY No date: CORONARY STENT PLACEMENT 10/21/2018: ENDOBRONCHIAL ULTRASOUND; N/A     Comment:  Procedure: ENDOBRONCHIAL ULTRASOUND;  Surgeon:               Laverle Hobby, MD;  Location: [facility] ORS;  Service:              Pulmonary;  Laterality: N/A; 2012: HERNIA REPAIR 12/19/2018: IR FLUORO GUIDE CV LINE RIGHT 03/01/2020: KYPHOPLASTY; N/A     Comment:  Procedure: T7 KYPHOPLASTY;  Surgeon: Hessie Knows, MD;               Location: [facility] ORS;  Service: Orthopedics;  Laterality:               N/A; No date: LAPAROTOMY 09/29/2014: LEFT HEART CATHETERIZATION WITH CORONARY ANGIOGRAM; N/A     Comment:  Procedure: LEFT HEART CATHETERIZATION WITH CORONARY               ANGIOGRAM;  Surgeon: Clent Demark, MD;  Location: [place]               CATH LAB;  Service: Cardiovascular;  Laterality: N/A; 01/02/2019: PORTA CATH INSERTION; N/A     Comment:  Procedure: PORTA CATH INSERTION;  Surgeon: Algernon Huxley,              MD;  Location:  [place] CV LAB;  Service:               Cardiovascular;  Laterality: N/A; 10/28/2018: PORTACATH PLACEMENT; Right     Comment:  Procedure: INSERTION PORT-A-CATH;  Surgeon: Jules Husbands, MD;  Location: [facility] ORS;  Service: General;                Laterality: Right;   Reproductive/Obstetrics                           Anesthesia Physical Anesthesia Plan  ASA: IV  Anesthesia Plan: General   Post-op Pain Management:    Induction: Intravenous  PONV Risk Score and Plan: 3 and Ondansetron, Midazolam, Dexamethasone and Treatment may vary due to age or medical condition  Airway Management Planned: Oral ETT  Additional Equipment:   Intra-op Plan:   Post-operative Plan: Extubation in OR  Informed Consent: I have reviewed the patients History and Physical, chart, labs  and discussed the procedure including the risks, benefits and alternatives for the proposed anesthesia with the patient or authorized representative who has indicated his/her understanding and acceptance.     Dental Advisory Given  Plan Discussed with: CRNA  Anesthesia Plan Comments:        Anesthesia Quick Evaluation

## 2021-02-27 NOTE — Anesthesia Procedure Notes (Signed)
Procedure Name: Intubation Performed by: Nelda Marseille, CRNA Pre-anesthesia Checklist: Patient identified, Patient being monitored, Timeout performed, Emergency Drugs available and Suction available Patient Re-evaluated:Patient Re-evaluated prior to induction Oxygen Delivery Method: Circle system utilized Preoxygenation: Pre-oxygenation with 100% oxygen Induction Type: IV induction Ventilation: Mask ventilation without difficulty Laryngoscope Size: 3 and McGraph Grade View: Grade I Tube type: Oral Tube size: 7.0 mm Number of attempts: 1 Airway Equipment and Method: Stylet and Video-laryngoscopy Placement Confirmation: ETT inserted through vocal cords under direct vision,  positive ETCO2 and breath sounds checked- equal and bilateral Secured at: 20 cm Tube secured with: Tape Dental Injury: Teeth and Oropharynx as per pre-operative assessment

## 2021-02-27 NOTE — Transfer of Care (Signed)
Immediate Anesthesia Transfer of Care Note  Patient: Kristi [name] [facility]  Procedure(s) Performed: XI ROBOTIC ASSISTED VENTRAL HERNIA (incisional) WITH MESH (N/A )  Patient Location: PACU  Anesthesia Type:General  Level of Consciousness: awake and sedated  Airway & Oxygen Therapy: Patient Spontanous Breathing and Patient connected to face mask oxygen  Post-op Assessment: Report given to RN and Post -op Vital signs reviewed and stable  Post vital signs: Reviewed and stable  Last Vitals:  Vitals Value Taken Time  BP 113/72 02/27/21 1800  Temp    Pulse 81 02/27/21 1806  Resp 21 02/27/21 1806  SpO2 100 % 02/27/21 1806  Vitals shown include unvalidated device data.  Last Pain:  Vitals:   02/27/21 1145  TempSrc: Temporal  PainSc: 0-No pain         Complications: No complications documented.

## 2021-02-27 NOTE — Op Note (Signed)
Procedure Date:  02/27/2021  Pre-operative Diagnosis:  Incisional hernia  Post-operative Diagnosis:  Incisional hernia  Procedure:  Robotic assisted Incisional Hernia Repair with mesh  Surgeon:  Melvyn Neth, MD  Anesthesia:  General endotracheal  Estimated Blood Loss:  10 ml  Specimens:  None  Complications:  None  Indications for Procedure:  This is a 75 y.o. female who presents with an incisional hernia at the umbilicus, s/p prior laparoscopic cholecystectomy.  The options of surgery versus observation were reviewed with the patient and/or family. The risks of bleeding, abscess or infection, recurrence of symptoms, potential for an open procedure, injury to surrounding structures, and chronic pain were all discussed with the patient and was willing to proceed.  Description of Procedure: The patient was correctly identified in the preoperative area and brought into the operating room.  The patient was placed supine with VTE prophylaxis in place.  Appropriate time-outs were performed.  Anesthesia was induced and the patient was intubated.  Appropriate antibiotics were infused.  The abdomen was prepped and draped in a sterile fashion. The patient's hernia defect was marked with a marking pen.  A Veress needle was introduced in the left upper quadrant and pneumoperitoneum was obtained with appropriate pressures.  An 8 mm robotic port was introduced in the left lateal position using OptiVue technique.  Veress needle was visualized and there was no injury.  Then, a 12-mm port was placed in the LUQ and an 8 mm port in the LLQ under direct visualization.  The DaVinci platform was docked, camera targeted, and instruments placed under direct visualization.  The patient had a moderate sized incisional hernia centered at the umbilicus.  The also had mesh that could be visualized in the epigastric area from prior ventral hernia repair.  The incisional hernia extended to just inferior to the  mesh.  There were significant adhesions of the omentum to the superior anterior abdominal wall, involving the mesh, and these were taken down carefully with cautery.  There was no bowel injury in the process.  The hernia contents had already self reduced during insufflation.  The hernia defect was measured to be 6.5 cm in length and 5.5 cm in width.  There was a small bridge of fascia in the middle of the hernia defect, making it two smaller defects together.  Gas pressure was decreased to 10 mmHg at that point to facilitate the closure.  The defects were closed using 0 Stratafix sutures x 2 ensuring good closure without any gaps and appropriate tension on the abdominal wall.  Then, a 4 x 6 inch Bard Ventralight ST Echo mesh was placed via the 12-mm port.  The positioning system was insufflated and the mesh was placed over the defect covering it entirely.  The mesh was then sutured in place using 2-0 v-lock sutures.  The prior mesh had one area where the mesh and abdominal wall had separated, and this was reapproximated using another 2-0 v-lock suture.  All needles, ruler, and echo positioning system were removed.   The DaVinci platform was undocked.  The 12-mm port was removed and the fascia was closed under direct visualization utilizing an Endo Close technique with 0 Vicryl suture.  Exparel solution mixed with 0.5% bupivacaine was infiltrated along the fascia, around the mesh, and the port sites.  The 5 mm ports were removed and pneumoperitoneum was released.  The incisions were closed with 4-0 Monocryl.  The wounds were cleaned and sealed with DermaBond.  The patient was emerged  from anesthesia and extubated and brought to the recovery room for further management.  The patient tolerated the procedure well and all counts were correct at the end of the case.   Melvyn Neth, MD

## 2021-02-27 NOTE — Discharge Instructions (Signed)

## 2021-02-27 NOTE — Anesthesia Postprocedure Evaluation (Signed)
Anesthesia Post Note  Patient: Reed Eifert [facility]  Procedure(s) Performed: XI ROBOTIC ASSISTED VENTRAL HERNIA (incisional) WITH MESH (N/A )  Patient location during evaluation: PACU Anesthesia Type: General Level of consciousness: awake and alert Pain management: pain level controlled Vital Signs Assessment: post-procedure vital signs reviewed and stable Respiratory status: spontaneous breathing, nonlabored ventilation and respiratory function stable Cardiovascular status: blood pressure returned to baseline and stable Postop Assessment: no apparent nausea or vomiting Anesthetic complications: no   No complications documented.   Last Vitals:  Vitals:   02/27/21 1845 02/27/21 1900  BP: 106/70 101/65  Pulse: 86 90  Resp: 11 11  Temp:  (!) 36.3 C  SpO2: 91% 96%    Last Pain:  Vitals:   02/27/21 1850  TempSrc:   PainSc: [place]

## 2021-02-28 ENCOUNTER — Encounter: Payer: Self-pay | Admitting: Surgery

## 2021-03-03 ENCOUNTER — Inpatient Hospital Stay (HOSPITAL_BASED_OUTPATIENT_CLINIC_OR_DEPARTMENT_OTHER): Payer: Medicare Other | Admitting: Oncology

## 2021-03-03 ENCOUNTER — Inpatient Hospital Stay: Payer: Medicare Other | Attending: Oncology

## 2021-03-03 ENCOUNTER — Inpatient Hospital Stay: Payer: Medicare Other

## 2021-03-03 ENCOUNTER — Other Ambulatory Visit: Payer: Self-pay | Admitting: Oncology

## 2021-03-03 ENCOUNTER — Encounter: Payer: Self-pay | Admitting: Oncology

## 2021-03-03 VITALS — BP 95/71 | HR 95 | Temp 96.8°F | Resp 16

## 2021-03-03 DIAGNOSIS — M8580 Other specified disorders of bone density and structure, unspecified site: Secondary | ICD-10-CM | POA: Diagnosis not present

## 2021-03-03 DIAGNOSIS — Z87891 Personal history of nicotine dependence: Secondary | ICD-10-CM | POA: Insufficient documentation

## 2021-03-03 DIAGNOSIS — C3490 Malignant neoplasm of unspecified part of unspecified bronchus or lung: Secondary | ICD-10-CM | POA: Diagnosis not present

## 2021-03-03 DIAGNOSIS — Z8589 Personal history of malignant neoplasm of other organs and systems: Secondary | ICD-10-CM

## 2021-03-03 DIAGNOSIS — Z5112 Encounter for antineoplastic immunotherapy: Secondary | ICD-10-CM

## 2021-03-03 DIAGNOSIS — R7989 Other specified abnormal findings of blood chemistry: Secondary | ICD-10-CM | POA: Diagnosis not present

## 2021-03-03 LAB — COMPREHENSIVE METABOLIC PANEL
ALT: 13 U/L (ref 0–44)
AST: 18 U/L (ref 15–41)
Albumin: 3.6 g/dL (ref 3.5–5.0)
Alkaline Phosphatase: 52 U/L (ref 38–126)
Anion gap: 8 (ref 5–15)
BUN: 14 mg/dL (ref 8–23)
CO2: 24 mmol/L (ref 22–32)
Calcium: 9.2 mg/dL (ref 8.9–10.3)
Chloride: 103 mmol/L (ref 98–111)
Creatinine, Ser: 1.09 mg/dL — ABNORMAL HIGH (ref 0.44–1.00)
GFR, Estimated: 53 mL/min — ABNORMAL LOW (ref >60)
Glucose, Bld: 108 mg/dL — ABNORMAL HIGH (ref 70–99)
Potassium: 3.9 mmol/L (ref 3.5–5.1)
Sodium: 135 mmol/L (ref 135–145)
Total Bilirubin: 0.7 mg/dL (ref 0.3–1.2)
Total Protein: 7 g/dL (ref 6.5–8.1)

## 2021-03-03 LAB — CBC WITH DIFFERENTIAL/PLATELET
Abs Immature Granulocytes: 0.04 10*3/uL (ref 0.00–0.07)
Basophils Absolute: 0 10*3/uL (ref 0.0–0.1)
Basophils Relative: 1 %
Eosinophils Absolute: 0.4 10*3/uL (ref 0.0–0.5)
Eosinophils Relative: 7 %
HCT: 35.5 % — ABNORMAL LOW (ref 36.0–46.0)
Hemoglobin: 11.7 g/dL — ABNORMAL LOW (ref 12.0–15.0)
Immature Granulocytes: 1 %
Lymphocytes Relative: 15 %
Lymphs Abs: 0.9 10*3/uL (ref 0.7–4.0)
MCH: 30.1 pg (ref 26.0–34.0)
MCHC: 33 g/dL (ref 30.0–36.0)
MCV: 91.3 fL (ref 80.0–100.0)
Monocytes Absolute: 0.8 10*3/uL (ref 0.1–1.0)
Monocytes Relative: 13 %
Neutro Abs: 3.9 10*3/uL (ref 1.7–7.7)
Neutrophils Relative %: 63 %
Platelets: 190 10*3/uL (ref 150–400)
RBC: 3.89 MIL/uL (ref 3.87–5.11)
RDW: 13.7 % (ref 11.5–15.5)
WBC: 6 10*3/uL (ref 4.0–10.5)
nRBC: 0 % (ref 0.0–0.2)

## 2021-03-03 MED ORDER — HEPARIN SOD (PORK) LOCK FLUSH 100 UNIT/ML IV SOLN
500.0000 [IU] | Freq: Once | INTRAVENOUS | Status: AC
  Administered 2021-03-03: 500 [IU] via INTRAVENOUS
  Filled 2021-03-03: qty 5

## 2021-03-03 NOTE — Progress Notes (Signed)
Hematology/Oncology Follow up note [facility] [phone] [phone]   Patient Care Team: Glean Hess, MD as PCP - General (Internal Medicine) Charolette Forward, MD as Consulting Physician (Cardiology) Telford Nab, RN as Registered Nurse [name], MD as Radiation Oncologist (Radiation Oncology)  REFERRING PROVIDER: Dr. Felicie Morn REASON FOR VISIT:  Follow-up for small cell lung cancer,   HISTORY OF PRESENTING ILLNESS:  Kristi [name] is a  75 y.o.  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  10/20/2018 CT chest with contrast showed large mediastinal mass involving both hilar, left greater than right, consistent with lung carcinoma, The mass causes narrowing of the left mainstem bronchus with resultant volume loss on the left and a mediastinal shift to the left.  Moderate size left pleural effusion. 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  # Initial MRI brain negative.  # Nov 2019- Jan 2020 s/p 4 cycles of Carbo/Etoposide/Tecentriq #  01/24/2019 interim CT scan done which showed continued positive response to therapy with continued reduction in mediastinal adenopathy.  No residual measurable left lung mass. CT findings of acute emphysematous cystitis. Urology did not feel that patient has pyelonephritis and recommend treatment with antibiotics because patient's immunocompromise. Patient finished treatment.   # 11/02/2018-01/09/2019 Chemotherapy carboplatin + Etoposide + tecentriq # Consolidation chest radiation and whole brain radiation finished in May 2020 # 04/25/2019 resume Tecentriq every 3 weeks. # Patient had a fall from her stairs on 01/19/2020. In the emergency room she had CT chest abdomen pelvis CT, CT head without contrast, CT cervical spine without contrast. No CT evidence for acute  intracranial abnormality, degenerative changes of cervical spine..  No CT evidence of acute thoracic abdomen injury.  Severe compression fracture of T7, subacute.  # kyphoplasty procedure on 03/01/2020.  T7 biopsy negative for cancer.  11/20/2020 Surveillance CT scan  Reduced size and prominence of right lateral lung base subpleural nodular density. No findings of active malignancy.  MRI brain showed stable post treatment brain. No metastatic disease.    INTERVAL HISTORY DAYSY [name] is a 75 y.o. female who has above history reviewed by me today presents for evaluation prior to immunotherapy for treatment of extensive small cell lung cancer. Patient is on immunotherapy maintenance. Patient reports not feeling well due to the recent robotic assisted incisional hernia repair with mesh.  Patient reports having soreness.  Appetite has decreased.  No fever, chills, nausea vomiting.  .  Review of Systems  Constitutional: Positive for fatigue. Negative for appetite change, chills and fever.  HENT:   Negative for hearing loss and voice change.   Eyes: Negative for eye problems.  Respiratory: Negative for chest tightness, cough and shortness of breath.   Cardiovascular: Negative for chest pain.  Gastrointestinal: Negative for abdominal distention, abdominal pain, blood in stool, diarrhea and nausea.  Endocrine: Negative for hot flashes.  Genitourinary: Negative for difficulty urinating and frequency.   Musculoskeletal: Negative for arthralgias and back pain.  Skin: Negative for itching and rash.  Neurological: Negative for extremity weakness and headaches.  Hematological: Negative for adenopathy.  Psychiatric/Behavioral: Negative for confusion and depression. The patient is not nervous/anxious.        Forgetful     MEDICAL HISTORY:  Past Medical History:  Diagnosis Date  . Acute MI, inferoposterior wall ([place]) 09/30/2014   1 stent  . Claustrophobia   . Coronary artery  disease   . GERD  (gastroesophageal reflux disease)   . Hypercholesteremia   . MI, old   . Pneumonia   . Small cell lung cancer ([place])   . 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 [facility]) 10/27/2018    SURGICAL HISTORY: Past Surgical History:  Procedure Laterality Date  . APPENDECTOMY     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  . BLADDER NECK SUSPENSION    . CHOLECYSTECTOMY N/A 08/14/2018   Procedure: LAPAROSCOPIC CHOLECYSTECTOMY;  Surgeon: Jules Husbands, MD;  Location: [facility] ORS;  Service: General;  Laterality: N/A;  . CHOLECYSTECTOMY  11/2018  . CORONARY ANGIOPLASTY  09/29/2014  . CORONARY STENT PLACEMENT    . ENDOBRONCHIAL ULTRASOUND N/A 10/21/2018   Procedure: ENDOBRONCHIAL ULTRASOUND;  Surgeon: Laverle Hobby, MD;  Location: [facility] ORS;  Service: Pulmonary;  Laterality: N/A;  . HERNIA REPAIR  2012  . IR FLUORO GUIDE CV LINE RIGHT  12/19/2018  . KYPHOPLASTY N/A 03/01/2020   Procedure: T7 KYPHOPLASTY;  Surgeon: Hessie Knows, MD;  Location: [facility] ORS;  Service: Orthopedics;  Laterality: N/A;  . LAPAROTOMY    . LEFT HEART CATHETERIZATION WITH CORONARY ANGIOGRAM N/A 09/29/2014   Procedure: LEFT HEART CATHETERIZATION WITH CORONARY ANGIOGRAM;  Surgeon: Clent Demark, MD;  Location: [facility] C[facility] LAB;  Service: Cardiovascular;  Laterality: N/A;  . PORTA CATH INSERTION N/A 01/02/2019   Procedure: PORTA CATH INSERTION;  Surgeon: Algernon Huxley, MD;  Location: [facility] CV LAB;  Service: Cardiovascular;  Laterality: N/A;  . PORTACATH PLACEMENT Right 10/28/2018   Procedure: INSERTION PORT-A-CATH;  Surgeon: Jules Husbands, MD;  Location: [facility] ORS;  Service: General;  Laterality: Right;  . XI ROBOTIC ASSISTED VENTRAL HERNIA N/A 02/27/2021   Procedure: XI ROBOTIC ASSISTED VENTRAL HERNIA (incisional) WITH MESH;  Surgeon: Olean Ree, MD;  Location: [facility] ORS;  Service: General;  Laterality: N/A;    SOCIAL HISTORY: Social History   Socioeconomic History  . Marital status: Married    Spouse name: [name]   . Number of children: 3   . Years of education: Not on file  . Highest education level: Not on file  Occupational History  . Occupation: Retired    Comment: Chief Technology Officer   Tobacco Use  . Smoking status: Former Smoker    Packs/day: 1.00    Years: 39.00    Pack years: 39.00    Types: Cigarettes    Start date: 12/21/1978    Quit date: 10/16/2018    Years since quitting: 2.3  . Smokeless tobacco: Never Used  Vaping Use  . Vaping Use: Never used  Substance and Sexual Activity  . Alcohol use: Yes    Alcohol/week: 0.0 standard drinks    Comment: occassional - approx 1 every 2 weeks   . Drug use: No  . Sexual activity: Yes    Birth control/protection: None  Other Topics Concern  . Not on file  Social History Narrative  . Not on file   Social Determinants of Health   Financial Resource Strain: Not on file  Food Insecurity: Not on file  Transportation Needs: Not on file  Physical Activity: Not on file  Stress: Not on file  Social Connections: Not on file  Intimate Partner Violence: Not on file    FAMILY HISTORY: Family History  Problem Relation Age of Onset  . Alzheimer's disease Mother   . Colon cancer Father   . Breast cancer Neg Hx     ALLERGIES:  has No Known Allergies.  MEDICATIONS:  Current Outpatient  Medications  Medication Sig Dispense Refill  . acetaminophen (TYLENOL) 500 MG tablet Take 2 tablets (1,000 mg total) by mouth every 6 (six) hours as needed for mild pain or headache.    . Calcium 600-200 MG-UNIT tablet Take 1 tablet by mouth 2 (two) times daily.    . Cholecalciferol (DIALYVITE VITAMIN D 5000) 125 MCG (5000 UT) capsule Take 5,000 Units by mouth daily.    [name] Kitchen ibuprofen (ADVIL) 600 MG tablet Take 1 tablet (600 mg total) by mouth every 8 (eight) hours as needed for moderate pain. 60 tablet 1  . lidocaine-prilocaine (EMLA) cream Apply 1 application topically daily as needed (port access). 30 g 2  . Magnesium 250 MG TABS Take 250 mg by mouth daily.    [name] Kitchen omeprazole (PRILOSEC) 40 MG  capsule Take 1 capsule (40 mg total) by mouth daily. (Patient taking differently: Take 40 mg by mouth as needed.) 90 capsule 1  . oxyCODONE (ROXICODONE) 5 MG immediate release tablet Take 1 tablet (5 mg total) by mouth every 6 (six) hours as needed for severe pain. 30 tablet 0  . prochlorperazine (COMPAZINE) 10 MG tablet Take 10 mg by mouth every 6 (six) hours as needed for nausea or vomiting.    . sertraline (ZOLOFT) 50 MG tablet Take 1.5 tablets (75 mg total) by mouth daily. (Patient taking differently: Take 75 mg by mouth every morning.) 45 tablet 6  . sucralfate (CARAFATE) 1 g tablet Take 1 g by mouth 3 (three) times daily as needed (heartburn).    . vitamin B-12 (CYANOCOBALAMIN) 1000 MCG tablet Take 1,000 mcg by mouth daily.    . nitroGLYCERIN (NITROSTAT) 0.4 MG SL tablet Place 1 tablet (0.4 mg total) under the tongue every 5 (five) minutes x 3 doses as needed for chest pain. (Patient not taking: No sig reported) 25 tablet 12   No current facility-administered medications for this visit.   Facility-Administered Medications Ordered in Other Visits  Medication Dose Route Frequency Provider Last Rate Last Admin  . heparin lock flush 100 unit/mL  500 Units Intravenous Once Earlie Server, MD      . heparin lock flush 100 unit/mL  500 Units Intravenous Once Earlie Server, MD      . sodium chloride flush (NS) 0.9 % injection 10 mL  10 mL Intravenous PRN Earlie Server, MD   10 mL at 01/09/19 0820  . sodium chloride flush (NS) 0.9 % injection 10 mL  10 mL Intravenous PRN Earlie Server, MD   10 mL at 01/10/19 1400     PHYSICAL EXAMINATION: ECOG PERFORMANCE STATUS: 1 - Symptomatic but completely ambulatory Vitals:   03/03/21 0853  BP: 95/71  Pulse: 95  Resp: 16  Temp: (!) 96.8 F (36 C)  SpO2: 100%   There were no vitals filed for this visit. Physical Examination Today's Vitals   03/03/21 0851 03/03/21 0853  BP:  95/71  Pulse:  95  Resp:  16  Temp:  (!) 96.8 F (36 C)  TempSrc:  Tympanic  SpO2:  100%   PainSc: 6    PainLoc: Abdomen    There is no height or weight on file to calculate BMI.   Physical Exam Constitutional:      General: She is not in acute distress.    Appearance: She is not diaphoretic.     Comments: Patient sits in the wheelchair.  HENT:     Head: Normocephalic and atraumatic.     Nose: Nose normal.     Mouth/Throat:  Pharynx: No oropharyngeal exudate.  Eyes:     General: No scleral icterus.    Pupils: Pupils are equal, round, and reactive to light.  Cardiovascular:     Rate and Rhythm: Normal rate and regular rhythm.     Heart sounds: No murmur heard.   Pulmonary:     Effort: Pulmonary effort is normal. No respiratory distress.     Breath sounds: No wheezing or rales.     Comments: Decreased breath sounds bilaterally.  Abdominal:     General: There is no distension.     Palpations: Abdomen is soft.     Tenderness: There is no abdominal tenderness.  Musculoskeletal:        General: Normal range of motion.     Cervical back: Normal range of motion and neck supple.  Skin:    General: Skin is warm and dry.     Findings: No erythema.  Neurological:     Mental Status: She is alert and oriented to person, place, and time.     Cranial Nerves: No cranial nerve deficit.     Motor: No abnormal muscle tone.     Coordination: Coordination normal.  Psychiatric:        Mood and Affect: Affect normal.        LABORATORY DATA:  I have reviewed the data as listed Lab Results  Component Value Date   WBC 6.0 03/03/2021   HGB 11.7 (L) 03/03/2021   HCT 35.5 (L) 03/03/2021   MCV 91.3 03/03/2021   PLT 190 03/03/2021   Recent Labs    08/12/20 1315 09/02/20 1358 09/23/20 0845 10/14/20 0839 01/20/21 0857 02/10/21 0846 03/03/21 0839  NA 134* 137 138   < > 132* 134* 135  K 4.0 3.5 4.0   < > 3.9 3.6 3.9  CL 104 105 106   < > 100 101 103  CO2 23 24 24    < > 26 22 24   GLUCOSE 98 103* 91   < > 103* 104* 108*  BUN 13 18 14    < > 18 16 14   CREATININE  0.81 0.68 0.75   < > 0.81 0.74 1.09*  CALCIUM 8.8* 8.3* 8.6*   < > 9.1 9.1 9.2  GFRNONAA >60 >60 >60   < > >60 >60 53*  GFRAA >60 >60 >60  --   --   --   --   PROT 7.4 6.8 7.0   < > 7.3 7.4 7.0  ALBUMIN 4.0 3.7 3.8   < > 3.9 4.0 3.6  AST 17 14* 17   < > 21 18 18   ALT 12 11 11    < > 13 14 13   ALKPHOS 49 49 47   < > 44 45 52  BILITOT 0.5 0.5 0.6   < > 0.3 0.6 0.7   < > = values in this interval not displayed.    RADIOGRAPHIC STUDIES: I have personally reviewed the radiological images as listed and agreed with the findings in the report.  CT CHEST ABDOMEN PELVIS W CONTRAST  Result Date: 02/26/2021 CLINICAL DATA:  Restaging small cell lung cancer. EXAM: CT CHEST, ABDOMEN, AND PELVIS WITH CONTRAST TECHNIQUE: Multidetector CT imaging of the chest, abdomen and pelvis was performed following the standard protocol during bolus administration of intravenous contrast. CONTRAST:  143mL OMNIPAQUE IOHEXOL 300 MG/ML  SOLN COMPARISON:  11/20/2020 FINDINGS: CT CHEST FINDINGS Cardiovascular: The heart is normal in size. No pericardial effusion. Stable tortuosity, ectasia and calcification of the thoracic  aorta and stable branch vessel calcifications including extensive three-vessel coronary artery calcifications. Mediastinum/Nodes: No mediastinal or hilar mass or adenopathy. There is chronic soft tissue thickening around the mid esophagus likely related to treated tumor or possibly radiation affect. The esophagus is grossly normal. Lungs/Pleura: Chronic emphysematous changes and pulmonary scarring. No findings suspicious for recurrent lung cancer or pulmonary metastatic disease. Stable calcified granuloma in the right middle lobe. Musculoskeletal: Age age stable vertebral augmentation changes at T7 and remote T4 fracture. Remote partially united sternal fracture. No acute bony findings or worrisome bone lesions. Moderate osteoporosis. CT ABDOMEN PELVIS FINDINGS Hepatobiliary: No findings suspicious for hepatic  metastatic disease. The gallbladder is surgically absent. No common bile duct dilatation. Pancreas: Moderate pancreatic atrophy but no mass or inflammation. Spleen: Normal size. No focal lesions. Small accessory spleen noted. Adrenals/Urinary Tract: Adrenal glands are unremarkable and stable. No renal lesions or hydronephrosis. No bladder lesions or calculi. Mild trabeculation. Stomach/Bowel: The stomach, duodenum, small bowel and colon are unremarkable. Vascular/Lymphatic: Advanced atherosclerotic calcifications involving the aorta and iliac arteries but no aneurysm or dissection. Extensive branch vessel calcifications are also noted. The major venous structures are patent. No mesenteric or retroperitoneal mass or adenopathy. Reproductive: The uterus and ovaries are unremarkable. Other: Stable appearing moderate-sized anterior abdominal wall hernia containing small bowel loops but no findings for incarceration or obstruction. Musculoskeletal: No acute bony findings or worrisome bone lesions. IMPRESSION: 1. Stable CT appearance of the chest, abdomen and pelvis. No findings suspicious for recurrent lung cancer or pulmonary metastatic disease. 2. Stable chronic soft tissue thickening around the mid esophagus likely related to treated tumor or possibly radiation effect. 3. Stable advanced atherosclerotic calcifications involving the thoracic and abdominal aorta and branch vessels including extensive three-vessel coronary artery calcifications. 4. Stable moderate-sized anterior abdominal wall hernia containing small bowel loops but no findings for incarceration or obstruction. 5. Emphysema and aortic atherosclerosis. Aortic Atherosclerosis (ICD10-I70.0) and Emphysema (ICD10-J43.9). Electronically Signed   By: Marijo Sanes M.D.   On: 02/26/2021 15:16     ASSESSMENT & PLAN:  1. Encounter for antineoplastic immunotherapy   2. Small cell lung cancer ([place])   3. 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 Osteopenia,  unspecified location   5. Elevated serum creatinine    #Extensive small cell lung cancer, S/p 4 cycles of Carboplatin, Etoposide and Tecentriq.   Currently on Tecentriq maintenance. CT was independently reviewed by me and discussed with patient Stable. Hold off immunotherapy as she is not feeling well today due to recent surgery. Postponed to next week for reevaluation # History bone metastasis, #Osteopenia she will proceed with zometa every 4-6 weeks.   Continue calcium and vitamin D.  We will proceed with Zometa next week. #Elevated creatinine, may be due to recent contrast study versus decreased oral intake due to recent surgery.  Encourage patient to increase oral hydration.  Offered her to do IV fluids hydration session today and she declined. # COPD/emphysema, Stable. Monitor.  Follow up in 1 week for reevaluation prior to Tecentriq.  Earlie Server, MD, PhD

## 2021-03-03 NOTE — Progress Notes (Signed)
Patient here for follow up. Pt reports that she had hernia surgery on 02/27/21. Pt reports pain when she moves.

## 2021-03-10 ENCOUNTER — Inpatient Hospital Stay: Payer: Medicare Other

## 2021-03-10 ENCOUNTER — Other Ambulatory Visit: Payer: Self-pay

## 2021-03-10 ENCOUNTER — Encounter: Payer: Self-pay | Admitting: Oncology

## 2021-03-10 ENCOUNTER — Inpatient Hospital Stay (HOSPITAL_BASED_OUTPATIENT_CLINIC_OR_DEPARTMENT_OTHER): Payer: Medicare Other | Admitting: Oncology

## 2021-03-10 VITALS — BP 104/75 | HR 94 | Temp 97.6°F | Resp 18 | Wt 146.8 lb

## 2021-03-10 DIAGNOSIS — C3490 Malignant neoplasm of unspecified part of unspecified bronchus or lung: Secondary | ICD-10-CM

## 2021-03-10 DIAGNOSIS — Z8589 Personal history of malignant neoplasm of other organs and systems: Secondary | ICD-10-CM

## 2021-03-10 DIAGNOSIS — Z5112 Encounter for antineoplastic immunotherapy: Secondary | ICD-10-CM | POA: Diagnosis not present

## 2021-03-10 DIAGNOSIS — E860 Dehydration: Secondary | ICD-10-CM

## 2021-03-10 DIAGNOSIS — M8580 Other specified disorders of bone density and structure, unspecified site: Secondary | ICD-10-CM | POA: Diagnosis not present

## 2021-03-10 DIAGNOSIS — Z87891 Personal history of nicotine dependence: Secondary | ICD-10-CM | POA: Diagnosis not present

## 2021-03-10 LAB — COMPREHENSIVE METABOLIC PANEL
ALT: 16 U/L (ref 0–44)
AST: 20 U/L (ref 15–41)
Albumin: 3.8 g/dL (ref 3.5–5.0)
Alkaline Phosphatase: 47 U/L (ref 38–126)
Anion gap: 11 (ref 5–15)
BUN: 11 mg/dL (ref 8–23)
CO2: 24 mmol/L (ref 22–32)
Calcium: 9.8 mg/dL (ref 8.9–10.3)
Chloride: 101 mmol/L (ref 98–111)
Creatinine, Ser: 0.89 mg/dL (ref 0.44–1.00)
GFR, Estimated: >60 mL/min (ref >60)
Glucose, Bld: 98 mg/dL (ref 70–99)
Potassium: 3.9 mmol/L (ref 3.5–5.1)
Sodium: 136 mmol/L (ref 135–145)
Total Bilirubin: 0.7 mg/dL (ref 0.3–1.2)
Total Protein: 7.2 g/dL (ref 6.5–8.1)

## 2021-03-10 LAB — CBC WITH DIFFERENTIAL/PLATELET
Abs Immature Granulocytes: 0.1 10*3/uL — ABNORMAL HIGH (ref 0.00–0.07)
Basophils Absolute: 0.1 10*3/uL (ref 0.0–0.1)
Basophils Relative: 1 %
Eosinophils Absolute: 0.3 10*3/uL (ref 0.0–0.5)
Eosinophils Relative: 5 %
HCT: 34.6 % — ABNORMAL LOW (ref 36.0–46.0)
Hemoglobin: 11.6 g/dL — ABNORMAL LOW (ref 12.0–15.0)
Immature Granulocytes: 2 %
Lymphocytes Relative: 23 %
Lymphs Abs: 1.4 10*3/uL (ref 0.7–4.0)
MCH: 30 pg (ref 26.0–34.0)
MCHC: 33.5 g/dL (ref 30.0–36.0)
MCV: 89.4 fL (ref 80.0–100.0)
Monocytes Absolute: 0.8 10*3/uL (ref 0.1–1.0)
Monocytes Relative: 14 %
Neutro Abs: 3.5 10*3/uL (ref 1.7–7.7)
Neutrophils Relative %: 55 %
Platelets: 239 10*3/uL (ref 150–400)
RBC: 3.87 MIL/uL (ref 3.87–5.11)
RDW: 13.2 % (ref 11.5–15.5)
WBC: 6.2 10*3/uL (ref 4.0–10.5)
nRBC: 0 % (ref 0.0–0.2)

## 2021-03-10 MED ORDER — SODIUM CHLORIDE 0.9 % IV SOLN
10.0000 mg | Freq: Once | INTRAVENOUS | Status: AC
  Administered 2021-03-10: 10 mg via INTRAVENOUS
  Filled 2021-03-10: qty 10

## 2021-03-10 MED ORDER — SODIUM CHLORIDE 0.9 % IV SOLN
Freq: Once | INTRAVENOUS | Status: AC
  Filled 2021-03-10: qty 250

## 2021-03-10 MED ORDER — SODIUM CHLORIDE 0.9% FLUSH
10.0000 mL | INTRAVENOUS | Status: DC | PRN
  Filled 2021-03-10: qty 10

## 2021-03-10 MED ORDER — ZOLEDRONIC ACID 4 MG/100ML IV SOLN
4.0000 mg | Freq: Once | INTRAVENOUS | Status: AC
  Administered 2021-03-10: 4 mg via INTRAVENOUS
  Filled 2021-03-10: qty 100

## 2021-03-10 MED ORDER — HEPARIN SOD (PORK) LOCK FLUSH 100 UNIT/ML IV SOLN
500.0000 [IU] | Freq: Once | INTRAVENOUS | Status: DC | PRN
  Filled 2021-03-10: qty 5

## 2021-03-10 MED ORDER — SODIUM CHLORIDE 0.9 % IV SOLN
1200.0000 mg | Freq: Once | INTRAVENOUS | Status: AC
  Administered 2021-03-10: 1200 mg via INTRAVENOUS
  Filled 2021-03-10: qty 20

## 2021-03-10 MED ORDER — DEXAMETHASONE SODIUM PHOSPHATE 10 MG/ML IJ SOLN: 10.0000 mg | Freq: Once | INTRAMUSCULAR | Status: DC

## 2021-03-10 MED ORDER — HEPARIN SOD (PORK) LOCK FLUSH 100 UNIT/ML IV SOLN
INTRAVENOUS | Status: AC
  Filled 2021-03-10: qty 5

## 2021-03-10 NOTE — Progress Notes (Signed)
Patient denies new problems/concerns today.   °

## 2021-03-10 NOTE — Progress Notes (Signed)
Pt tolerated all infusions well today with no complaints.  Pt left the chemo suite stable in a wheelchair.

## 2021-03-10 NOTE — Progress Notes (Signed)
Hematology/Oncology Follow up note [facility] [phone] [phone]   Patient Care Team: Glean Hess, MD as PCP - General (Internal Medicine) Charolette Forward, MD as Consulting Physician (Cardiology) Telford Nab, RN as Registered Nurse [name], MD as Radiation Oncologist (Radiation Oncology)  REFERRING PROVIDER: Dr. Felicie Morn REASON FOR VISIT:  Follow-up for small cell lung cancer,   HISTORY OF PRESENTING ILLNESS:  Kristi [name] is a  75 y.o.  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  10/20/2018 CT chest with contrast showed large mediastinal mass involving both hilar, left greater than right, consistent with lung carcinoma, The mass causes narrowing of the left mainstem bronchus with resultant volume loss on the left and a mediastinal shift to the left.  Moderate size left pleural effusion. 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  # Initial MRI brain negative.  # Nov 2019- Jan 2020 s/p 4 cycles of Carbo/Etoposide/Tecentriq #  01/24/2019 interim CT scan done which showed continued positive response to therapy with continued reduction in mediastinal adenopathy.  No residual measurable left lung mass. CT findings of acute emphysematous cystitis. Urology did not feel that patient has pyelonephritis and recommend treatment with antibiotics because patient's immunocompromise. Patient finished treatment.   # 11/02/2018-01/09/2019 Chemotherapy carboplatin + Etoposide + tecentriq # Consolidation chest radiation and whole brain radiation finished in May 2020 # 04/25/2019 resume Tecentriq every 3 weeks. # Patient had a fall from her stairs on 01/19/2020. In the emergency room she had CT chest abdomen pelvis CT, CT head without contrast, CT cervical spine without contrast. No CT evidence for acute  intracranial abnormality, degenerative changes of cervical spine..  No CT evidence of acute thoracic abdomen injury.  Severe compression fracture of T7, subacute.  # kyphoplasty procedure on 03/01/2020.  T7 biopsy negative for cancer.  11/20/2020 Surveillance CT scan  Reduced size and prominence of right lateral lung base subpleural nodular density. No findings of active malignancy.  MRI brain showed stable post treatment brain. No metastatic disease.    INTERVAL HISTORY Kristi [name] is a 75 y.o. female who has above history reviewed by me today presents for evaluation prior to immunotherapy for treatment of extensive small cell lung cancer. Patient is on immunotherapy maintenance. recent robotic assisted incisional hernia repair with mesh.   Abdominal soreness has improved.  She takes oxycodone occasionally.  She has had a headache recently.   .  Review of Systems  Constitutional: Positive for fatigue. Negative for appetite change, chills and fever.  HENT:   Negative for hearing loss and voice change.   Eyes: Negative for eye problems.  Respiratory: Negative for chest tightness, cough and shortness of breath.   Cardiovascular: Negative for chest pain.  Gastrointestinal: Negative for abdominal distention, abdominal pain, blood in stool, diarrhea and nausea.  Endocrine: Negative for hot flashes.  Genitourinary: Negative for difficulty urinating and frequency.   Musculoskeletal: Negative for arthralgias and back pain.  Skin: Negative for itching and rash.  Neurological: Negative for extremity weakness and headaches.  Hematological: Negative for adenopathy.  Psychiatric/Behavioral: Negative for confusion and depression. The patient is not nervous/anxious.        Forgetful     MEDICAL HISTORY:  Past Medical History:  Diagnosis Date  . Acute MI, inferoposterior wall ([place]) 09/30/2014   1 stent  . Claustrophobia   . Coronary artery disease   .  GERD (gastroesophageal reflux  disease)   . Hypercholesteremia   . MI, old   . Pneumonia   . Small cell lung cancer ([place])   . 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 [facility]) 10/27/2018    SURGICAL HISTORY: Past Surgical History:  Procedure Laterality Date  . APPENDECTOMY     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  . BLADDER NECK SUSPENSION    . CHOLECYSTECTOMY N/A 08/14/2018   Procedure: LAPAROSCOPIC CHOLECYSTECTOMY;  Surgeon: Jules Husbands, MD;  Location: [facility] ORS;  Service: General;  Laterality: N/A;  . CHOLECYSTECTOMY  11/2018  . CORONARY ANGIOPLASTY  09/29/2014  . CORONARY STENT PLACEMENT    . ENDOBRONCHIAL ULTRASOUND N/A 10/21/2018   Procedure: ENDOBRONCHIAL ULTRASOUND;  Surgeon: Laverle Hobby, MD;  Location: [facility] ORS;  Service: Pulmonary;  Laterality: N/A;  . HERNIA REPAIR  2012  . IR FLUORO GUIDE CV LINE RIGHT  12/19/2018  . KYPHOPLASTY N/A 03/01/2020   Procedure: T7 KYPHOPLASTY;  Surgeon: Hessie Knows, MD;  Location: [facility] ORS;  Service: Orthopedics;  Laterality: N/A;  . LAPAROTOMY    . LEFT HEART CATHETERIZATION WITH CORONARY ANGIOGRAM N/A 09/29/2014   Procedure: LEFT HEART CATHETERIZATION WITH CORONARY ANGIOGRAM;  Surgeon: Clent Demark, MD;  Location[facility] CATH LAB;  Service: Cardiovascular;  Laterality: N/A;  . PORTA CATH INSERTION N/A 01/02/2019   Procedure: PORTA CATH INSERTION;  Surgeon: Algernon Huxley, MD;  Location: [facility] CV LAB;  Service: Cardiovascular;  Laterality: N/A;  . PORTACATH PLACEMENT Right 10/28/2018   Procedure: INSERTION PORT-A-CATH;  Surgeon: Jules Husbands, MD;  Location: [facility] ORS;  Service: General;  Laterality: Right;  . XI ROBOTIC ASSISTED VENTRAL HERNIA N/A 02/27/2021   Procedure: XI ROBOTIC ASSISTED VENTRAL HERNIA (incisional) WITH MESH;  Surgeon: Olean Ree, MD;  Location: [facility] ORS;  Service: General;  Laterality: N/A;    SOCIAL HISTORY: Social History   Socioeconomic History  . Marital status: Married    Spouse name: [name]   . Number of children: 3  . Years of education: Not  on file  . Highest education level: Not on file  Occupational History  . Occupation: Retired    Comment: Chief Technology Officer   Tobacco Use  . Smoking status: Former Smoker    Packs/day: 1.00    Years: 39.00    Pack years: 39.00    Types: Cigarettes    Start date: 12/21/1978    Quit date: 10/16/2018    Years since quitting: 2.4  . Smokeless tobacco: Never Used  Vaping Use  . Vaping Use: Never used  Substance and Sexual Activity  . Alcohol use: Yes    Alcohol/week: 0.0 standard drinks    Comment: occassional - approx 1 every 2 weeks   . Drug use: No  . Sexual activity: Yes    Birth control/protection: None  Other Topics Concern  . Not on file  Social History Narrative  . Not on file   Social Determinants of Health   Financial Resource Strain: Not on file  Food Insecurity: Not on file  Transportation Needs: Not on file  Physical Activity: Not on file  Stress: Not on file  Social Connections: Not on file  Intimate Partner Violence: Not on file    FAMILY HISTORY: Family History  Problem Relation Age of Onset  . Alzheimer's disease Mother   . Colon cancer Father   . Breast cancer Neg Hx     ALLERGIES:  has No Known Allergies.  MEDICATIONS:  Current Outpatient Medications  Medication Sig  Dispense Refill  . acetaminophen (TYLENOL) 500 MG tablet Take 2 tablets (1,000 mg total) by mouth every 6 (six) hours as needed for mild pain or headache.    . Calcium 600-200 MG-UNIT tablet Take 1 tablet by mouth 2 (two) times daily.    . Cholecalciferol (DIALYVITE VITAMIN D 5000) 125 MCG (5000 UT) capsule Take 5,000 Units by mouth daily.    [name] Kitchen ibuprofen (ADVIL) 600 MG tablet Take 1 tablet (600 mg total) by mouth every 8 (eight) hours as needed for moderate pain. 60 tablet 1  . lidocaine-prilocaine (EMLA) cream Apply 1 application topically daily as needed (port access). 30 g 2  . Magnesium 250 MG TABS Take 250 mg by mouth daily.    [name] Kitchen omeprazole (PRILOSEC) 40 MG capsule Take 1 capsule (40  mg total) by mouth daily. (Patient taking differently: Take 40 mg by mouth as needed.) 90 capsule 1  . oxyCODONE (ROXICODONE) 5 MG immediate release tablet Take 1 tablet (5 mg total) by mouth every 6 (six) hours as needed for severe pain. 30 tablet 0  . prochlorperazine (COMPAZINE) 10 MG tablet Take 10 mg by mouth every 6 (six) hours as needed for nausea or vomiting.    . sertraline (ZOLOFT) 50 MG tablet Take 1.5 tablets (75 mg total) by mouth daily. (Patient taking differently: Take 75 mg by mouth every morning.) 45 tablet 6  . sucralfate (CARAFATE) 1 g tablet Take 1 g by mouth 3 (three) times daily as needed (heartburn).    . vitamin B-12 (CYANOCOBALAMIN) 1000 MCG tablet Take 1,000 mcg by mouth daily.    . nitroGLYCERIN (NITROSTAT) 0.4 MG SL tablet Place 1 tablet (0.4 mg total) under the tongue every 5 (five) minutes x 3 doses as needed for chest pain. (Patient not taking: No sig reported) 25 tablet 12   No current facility-administered medications for this visit.   Facility-Administered Medications Ordered in Other Visits  Medication Dose Route Frequency Provider Last Rate Last Admin  . heparin lock flush 100 unit/mL  500 Units Intravenous Once Earlie Server, MD      . heparin lock flush 100 unit/mL  500 Units Intravenous Once Earlie Server, MD      . sodium chloride flush (NS) 0.9 % injection 10 mL  10 mL Intravenous PRN Earlie Server, MD   10 mL at 01/09/19 0820  . sodium chloride flush (NS) 0.9 % injection 10 mL  10 mL Intravenous PRN Earlie Server, MD   10 mL at 01/10/19 1400     PHYSICAL EXAMINATION: ECOG PERFORMANCE STATUS: 1 - Symptomatic but completely ambulatory Vitals:   03/10/21 0953  BP: 104/75  Pulse: 94  Resp: 18  Temp: 97.6 F (36.4 C)   Filed Weights   03/10/21 0953  Weight: 146 lb 12.8 oz (66.6 kg)   Physical Examination Today's Vitals   03/10/21 0953  BP: 104/75  Pulse: 94  Resp: 18  Temp: 97.6 F (36.4 C)  Weight: 146 lb 12.8 oz (66.6 kg)  PainSc: 4    Body mass index is  22.99 kg/m.   Physical Exam Constitutional:      General: She is not in acute distress.    Appearance: She is not diaphoretic.     Comments: Patient sits in the wheelchair.  HENT:     Head: Normocephalic and atraumatic.     Nose: Nose normal.     Mouth/Throat:     Pharynx: No oropharyngeal exudate.  Eyes:     General: No scleral  icterus.    Pupils: Pupils are equal, round, and reactive to light.  Cardiovascular:     Rate and Rhythm: Normal rate and regular rhythm.     Heart sounds: No murmur heard.   Pulmonary:     Effort: Pulmonary effort is normal. No respiratory distress.     Breath sounds: No wheezing or rales.     Comments: Decreased breath sounds bilaterally.  Abdominal:     General: There is no distension.     Palpations: Abdomen is soft.     Tenderness: There is no abdominal tenderness.  Musculoskeletal:        General: Normal range of motion.     Cervical back: Normal range of motion and neck supple.  Skin:    General: Skin is warm and dry.     Findings: No erythema.  Neurological:     Mental Status: She is alert and oriented to person, place, and time.     Cranial Nerves: No cranial nerve deficit.     Motor: No abnormal muscle tone.     Coordination: Coordination normal.  Psychiatric:        Mood and Affect: Affect normal.        LABORATORY DATA:  I have reviewed the data as listed Lab Results  Component Value Date   WBC 6.2 03/10/2021   HGB 11.6 (L) 03/10/2021   HCT 34.6 (L) 03/10/2021   MCV 89.4 03/10/2021   PLT 239 03/10/2021   Recent Labs    08/12/20 1315 09/02/20 1358 09/23/20 0845 10/14/20 0839 02/10/21 0846 03/03/21 0839 03/10/21 0910  NA 134* 137 138   < > 134* 135 136  K 4.0 3.5 4.0   < > 3.6 3.9 3.9  CL 104 105 106   < > 101 103 101  CO2 23 24 24    < > 22 24 24   GLUCOSE 98 103* 91   < > 104* 108* 98  BUN 13 18 14    < > 16 14 11   CREATININE 0.81 0.68 0.75   < > 0.74 1.09* 0.89  CALCIUM 8.8* 8.3* 8.6*   < > 9.1 9.2 9.8   GFRNONAA >60 >60 >60   < > >60 53* >60  GFRAA >60 >60 >60  --   --   --   --   PROT 7.4 6.8 7.0   < > 7.4 7.0 7.2  ALBUMIN 4.0 3.7 3.8   < > 4.0 3.6 3.8  AST 17 14* 17   < > 18 18 20   ALT 12 11 11    < > 14 13 16   ALKPHOS 49 49 47   < > 45 52 47  BILITOT 0.5 0.5 0.6   < > 0.6 0.7 0.7   < > = values in this interval not displayed.    RADIOGRAPHIC STUDIES: I have personally reviewed the radiological images as listed and agreed with the findings in the report.  CT CHEST ABDOMEN PELVIS W CONTRAST  Result Date: 02/26/2021 CLINICAL DATA:  Restaging small cell lung cancer. EXAM: CT CHEST, ABDOMEN, AND PELVIS WITH CONTRAST TECHNIQUE: Multidetector CT imaging of the chest, abdomen and pelvis was performed following the standard protocol during bolus administration of intravenous contrast. CONTRAST:  156mL OMNIPAQUE IOHEXOL 300 MG/ML  SOLN COMPARISON:  11/20/2020 FINDINGS: CT CHEST FINDINGS Cardiovascular: The heart is normal in size. No pericardial effusion. Stable tortuosity, ectasia and calcification of the thoracic aorta and stable branch vessel calcifications including extensive three-vessel coronary artery calcifications. Mediastinum/Nodes:  No mediastinal or hilar mass or adenopathy. There is chronic soft tissue thickening around the mid esophagus likely related to treated tumor or possibly radiation affect. The esophagus is grossly normal. Lungs/Pleura: Chronic emphysematous changes and pulmonary scarring. No findings suspicious for recurrent lung cancer or pulmonary metastatic disease. Stable calcified granuloma in the right middle lobe. Musculoskeletal: Age age stable vertebral augmentation changes at T7 and remote T4 fracture. Remote partially united sternal fracture. No acute bony findings or worrisome bone lesions. Moderate osteoporosis. CT ABDOMEN PELVIS FINDINGS Hepatobiliary: No findings suspicious for hepatic metastatic disease. The gallbladder is surgically absent. No common bile duct  dilatation. Pancreas: Moderate pancreatic atrophy but no mass or inflammation. Spleen: Normal size. No focal lesions. Small accessory spleen noted. Adrenals/Urinary Tract: Adrenal glands are unremarkable and stable. No renal lesions or hydronephrosis. No bladder lesions or calculi. Mild trabeculation. Stomach/Bowel: The stomach, duodenum, small bowel and colon are unremarkable. Vascular/Lymphatic: Advanced atherosclerotic calcifications involving the aorta and iliac arteries but no aneurysm or dissection. Extensive branch vessel calcifications are also noted. The major venous structures are patent. No mesenteric or retroperitoneal mass or adenopathy. Reproductive: The uterus and ovaries are unremarkable. Other: Stable appearing moderate-sized anterior abdominal wall hernia containing small bowel loops but no findings for incarceration or obstruction. Musculoskeletal: No acute bony findings or worrisome bone lesions. IMPRESSION: 1. Stable CT appearance of the chest, abdomen and pelvis. No findings suspicious for recurrent lung cancer or pulmonary metastatic disease. 2. Stable chronic soft tissue thickening around the mid esophagus likely related to treated tumor or possibly radiation effect. 3. Stable advanced atherosclerotic calcifications involving the thoracic and abdominal aorta and branch vessels including extensive three-vessel coronary artery calcifications. 4. Stable moderate-sized anterior abdominal wall hernia containing small bowel loops but no findings for incarceration or obstruction. 5. Emphysema and aortic atherosclerosis. Aortic Atherosclerosis (ICD10-I70.0) and Emphysema (ICD10-J43.9). Electronically Signed   By: Marijo Sanes M.D.   On: 02/26/2021 15:16     ASSESSMENT & PLAN:  1. Small cell lung cancer ([place])   2. Encounter for antineoplastic immunotherapy   3. 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 Osteopenia, unspecified location    #Extensive small cell lung cancer, S/p 4 cycles of  Carboplatin, Etoposide and Tecentriq.   Currently on Tecentriq maintenance. Stable disease on recent CT scan.  Labs are reviewed And discussed with patient.  Proceed with Tecentriq today.  # History bone metastasis, #Osteopenia  zometa every 4-6 weeks.   Continue calcium and vitamin D.   Zometa today.  #Elevated creatinine, resolved.  Encourage oral hydration.   # COPD/emphysema, Stable. Monitor.  Follow up in 1 week for reevaluation prior to Tecentriq.  Earlie Server, MD, PhD

## 2021-03-11 ENCOUNTER — Other Ambulatory Visit: Payer: Self-pay | Admitting: *Deleted

## 2021-03-11 DIAGNOSIS — F32A Depression, unspecified: Secondary | ICD-10-CM

## 2021-03-11 MED ORDER — SERTRALINE HCL 50 MG PO TABS: 75.0000 mg | ORAL_TABLET | Freq: Every day | ORAL | 6 refills | Status: DC

## 2021-03-14 ENCOUNTER — Other Ambulatory Visit: Payer: Self-pay

## 2021-03-14 ENCOUNTER — Encounter: Payer: Self-pay | Admitting: Surgery

## 2021-03-14 ENCOUNTER — Ambulatory Visit (INDEPENDENT_AMBULATORY_CARE_PROVIDER_SITE_OTHER): Payer: Medicare Other | Admitting: Surgery

## 2021-03-14 VITALS — BP 109/74 | HR 86 | Temp 97.7°F | Ht 66.0 in | Wt 147.6 lb

## 2021-03-14 DIAGNOSIS — K432 Incisional hernia without obstruction or gangrene: Secondary | ICD-10-CM

## 2021-03-14 DIAGNOSIS — Z09 Encounter for follow-up examination after completed treatment for conditions other than malignant neoplasm: Secondary | ICD-10-CM

## 2021-03-14 NOTE — Patient Instructions (Addendum)
If you have any concerns or questions, please feel free to call our office.    GENERAL POST-OPERATIVE PATIENT INSTRUCTIONS   WOUND CARE INSTRUCTIONS:  Keep a dry clean dressing on the wound if there is drainage. The initial bandage may be removed after 24 hours.  Once the wound has quit draining you may leave it open to air.  If clothing rubs against the wound or causes irritation and the wound is not draining you may cover it with a dry dressing during the daytime.  Try to keep the wound dry and avoid ointments on the wound unless directed to do so.  If the wound becomes bright red and painful or starts to drain infected material that is not clear, please contact your physician immediately.  If the wound is mildly pink and has a thick firm ridge underneath it, this is normal, and is referred to as a healing ridge.  This will resolve over the next 4-6 weeks.  BATHING: You may shower if you have been informed of this by your surgeon. However, Please do not submerge in a tub, hot tub, or pool until incisions are completely sealed or have been told by your surgeon that you may do so.  DIET:  You may eat any foods that you can tolerate.  It is a good idea to eat a high fiber diet and take in plenty of fluids to prevent constipation.  If you do become constipated you may want to take a mild laxative or take ducolax tablets on a daily basis until your bowel habits are regular.  Constipation can be very uncomfortable, along with straining, after recent surgery.  ACTIVITY:  You are encouraged to cough and deep breath or use your incentive spirometer if you were given one, every 15-30 minutes when awake.  This will help prevent respiratory complications and low grade fevers post-operatively if you had a general anesthetic.  You may want to hug a pillow when coughing and sneezing to add additional support to the surgical area, if you had abdominal or chest surgery, which will decrease pain during these times.   You are encouraged to walk and engage in light activity for the next two weeks.  You should not lift more than 10-20 pounds, until 04/10/2021 as it could put you at increased risk for complications.  Twenty pounds is roughly equivalent to a plastic bag of groceries. At that time- Listen to your body when lifting, if you have pain when lifting, stop and then try again in a few days. Soreness after doing exercises or activities of daily living is normal as you get back in to your normal routine.  MEDICATIONS:  Try to take narcotic medications and anti-inflammatory medications, such as tylenol, ibuprofen, naprosyn, etc., with food.  This will minimize stomach upset from the medication.  Should you develop nausea and vomiting from the pain medication, or develop a rash, please discontinue the medication and contact your physician.  You should not drive, make important decisions, or operate machinery when taking narcotic pain medication.  SUNBLOCK Use sun block to incision area over the next year if this area will be exposed to sun. This helps decrease scarring and will allow you avoid a permanent darkened area over your incision.      Laparoscopic Ventral Hernia Repair, Care After The following information offers guidance on how to care for yourself after your procedure. Your health care provider may also give you more specific instructions. If you have problems or questions,  contact your health care provider. What can I expect after the procedure? After the procedure, it is common to have pain, discomfort, or soreness. Follow these instructions at home: Medicines  Take over-the-counter and prescription medicines only as told by your health care provider.  Ask your health care provider if the medicine prescribed to you: ? Requires you to avoid driving or using machinery. ? Can cause constipation. You may need to take these actions to prevent or treat constipation:  Drink enough fluid to keep your  urine pale yellow.  Take over-the-counter or prescription medicines.  Eat foods that are high in fiber, such as beans, whole grains, and fresh fruits and vegetables.  Limit foods that are high in fat and processed sugars, such as fried or sweet foods. Incision care  Follow instructions from your health care provider about how to take care of your incisions. Make sure you: ? Wash your hands with soap and water for at least 20 seconds before and after you change your bandage (dressing) or before you touch your abdomen. If soap and water are not available, use hand sanitizer. ? Change your dressing as told by your health care provider. ? Leave stitches (sutures), skin glue, or adhesive strips in place. These skin closures may need to stay in place for 2 weeks or longer. If adhesive strip edges start to loosen and curl up, you may trim the loose edges. Do not remove adhesive strips completely unless your health care provider tells you to do that.  Check your incision areas every day for signs of infection. Check for: ? More redness, swelling, or pain. ? Fluid or blood. ? Warmth. ? Pus or a bad smell.   Bathing  Do not take baths, swim, or use a hot tub until your health care provider approves. Ask your health care provider if you may take showers. You may only be allowed to take sponge baths.  Keep your dressing dry until your health care provider says it can be removed.   Activity  Rest as told by your health care provider.  Avoid sitting for a long time without moving. Get up to take short walks every 1-2 hours. This is important to improve blood flow and breathing. Ask for help if you feel weak or unsteady.  Do not lift anything that is heavier than 10 lb (4.5 kg), or the limit that you are told, until your health care provider says that it is safe.  If you were given a sedative during the procedure, it can affect you for several hours. Do not drive or operate machinery until your  health care provider says that it is safe.  Return to your normal activities as told by your health care provider. Ask your health care provider what activities are safe for you.   General instructions  Hold a pillow over your abdomen when you cough or sneeze. This helps with pain.  Do not use any products that contain nicotine or tobacco. These products include cigarettes, chewing tobacco, and vaping devices, such as e-cigarettes. These can delay healing after surgery. If you need help quitting, ask your health care provider.  You may be asked to continue to do deep breathing exercises at home. This will help to prevent a lung infection.  Keep all follow-up visits. This is important.   Contact a health care provider if:  You have any of these signs of infection: ? More redness, swelling, or pain around an incision. ? Fluid or blood coming  from an incision. ? Warmth coming from an incision. ? Pus or a bad smell coming from an incision. ? A fever or chills.  You have pain that gets worse or does not get better with medicine.  You have nausea or vomiting.  You have a cough.  You have shortness of breath.  You have not had a bowel movement in 3 days.  You are not able to urinate. Get help right away if you have:  Severe pain in your abdomen.  Persistent nausea and vomiting.  Redness, warmth, or pain in your leg.  Chest pain.  Trouble breathing. These symptoms may represent a serious problem that is an emergency. Do not wait to see if the symptoms will go away. Get medical help right away. Call your local emergency services (911 in the [place] Do not drive yourself to the hospital. Summary  After this procedure, it is common to have pain, discomfort, or soreness.  Follow instructions from your health care provider about how to take care of your incision.  Check your incision area every day for signs of infection. Report any signs of infection to your health care  provider.  Keep all follow-up visits. This is important. This information is not intended to replace advice given to you by your health care provider. Make sure you discuss any questions you have with your health care provider. Document Revised: 07/26/2020 Document Reviewed: 07/26/2020 Elsevier Patient Education  2021 [employer].

## 2021-03-16 ENCOUNTER — Encounter: Payer: Self-pay | Admitting: Surgery

## 2021-03-16 MED ORDER — OXYCODONE HCL 5 MG PO TABS
5.0000 mg | ORAL_TABLET | Freq: Four times a day (QID) | ORAL | 0 refills | Status: DC | PRN
Start: 2021-03-16 — End: 2021-10-20

## 2021-03-16 NOTE — Progress Notes (Addendum)
03/14/2021  HPI: Kristi [name] is a 75 y.o. female s/p robotic assisted incisional hernia repair with mesh on 02/27/21.  She presents today for follow up.  She reports that she has been having pain in the LUQ.  Denies any issues with the incisions themselves.  There is some discomfort at the umbilical area but the pain is more in the LUQ.  The pain medication prescription helps but she's running short on the medication.    Vital signs: BP 109/74   Pulse 86   Temp 97.7 F (36.5 C) (Oral)   Ht 5\' 6"  (1.676 m)   Wt 147 lb 9.6 oz (67 kg)   SpO2 98%   BMI 23.82 kg/m    Physical Exam: Constitutional:  No acute distress Abdomen:  Soft non-distended with tenderness in the LUQ.  This is around the border between the previous mesh and the new mesh that was inserted.  The incisions are healing well, but these are not the site of infection.  The umbilical area is without recurrence.  Assessment/Plan: This is a 75 y.o. female s/p robotic assisted incisional hernia repair with mesh.  --Discussed with the patient that the pain is likely from the sutures used to secure the mesh in place.  The prior mesh also had two areas where it was trying to separate and these were also reinforced.  The location of her pain is in one of these locations and where the new mesh overlaps the prior mesh.  This should improve as the inflammation from the repair and sutures improves.   --Continue activity restrictions. --Will refill the patient's Oxycodone for pain control. --Follow up in a month.   Melvyn Neth, [facility]

## 2021-03-31 ENCOUNTER — Inpatient Hospital Stay (HOSPITAL_BASED_OUTPATIENT_CLINIC_OR_DEPARTMENT_OTHER): Payer: Medicare Other | Admitting: Hospice and Palliative Medicine

## 2021-03-31 ENCOUNTER — Inpatient Hospital Stay: Payer: Medicare Other | Attending: Oncology | Admitting: Oncology

## 2021-03-31 ENCOUNTER — Other Ambulatory Visit: Payer: Self-pay

## 2021-03-31 ENCOUNTER — Telehealth: Payer: Self-pay | Admitting: *Deleted

## 2021-03-31 ENCOUNTER — Inpatient Hospital Stay: Payer: Medicare Other

## 2021-03-31 ENCOUNTER — Encounter: Payer: Self-pay | Admitting: Oncology

## 2021-03-31 VITALS — BP 102/69 | HR 75 | Resp 18

## 2021-03-31 VITALS — BP 105/71 | HR 76 | Temp 98.0°F | Resp 20 | Wt 148.7 lb

## 2021-03-31 DIAGNOSIS — Z79899 Other long term (current) drug therapy: Secondary | ICD-10-CM | POA: Insufficient documentation

## 2021-03-31 DIAGNOSIS — C3490 Malignant neoplasm of unspecified part of unspecified bronchus or lung: Secondary | ICD-10-CM | POA: Diagnosis not present

## 2021-03-31 DIAGNOSIS — I25119 Atherosclerotic heart disease of native coronary artery with unspecified angina pectoris: Secondary | ICD-10-CM

## 2021-03-31 DIAGNOSIS — F419 Anxiety disorder, unspecified: Secondary | ICD-10-CM | POA: Diagnosis not present

## 2021-03-31 DIAGNOSIS — K219 Gastro-esophageal reflux disease without esophagitis: Secondary | ICD-10-CM

## 2021-03-31 DIAGNOSIS — Z87891 Personal history of nicotine dependence: Secondary | ICD-10-CM | POA: Diagnosis not present

## 2021-03-31 DIAGNOSIS — Z8589 Personal history of malignant neoplasm of other organs and systems: Secondary | ICD-10-CM

## 2021-03-31 DIAGNOSIS — R413 Other amnesia: Secondary | ICD-10-CM

## 2021-03-31 DIAGNOSIS — M8580 Other specified disorders of bone density and structure, unspecified site: Secondary | ICD-10-CM

## 2021-03-31 DIAGNOSIS — Z515 Encounter for palliative care: Secondary | ICD-10-CM

## 2021-03-31 DIAGNOSIS — F32A Depression, unspecified: Secondary | ICD-10-CM | POA: Diagnosis not present

## 2021-03-31 DIAGNOSIS — Z5112 Encounter for antineoplastic immunotherapy: Secondary | ICD-10-CM

## 2021-03-31 DIAGNOSIS — C7931 Secondary malignant neoplasm of brain: Secondary | ICD-10-CM | POA: Insufficient documentation

## 2021-03-31 DIAGNOSIS — G4700 Insomnia, unspecified: Secondary | ICD-10-CM

## 2021-03-31 DIAGNOSIS — R5383 Other fatigue: Secondary | ICD-10-CM

## 2021-03-31 LAB — CBC WITH DIFFERENTIAL/PLATELET
Abs Immature Granulocytes: 0.06 10*3/uL (ref 0.00–0.07)
Basophils Absolute: 0.1 10*3/uL (ref 0.0–0.1)
Basophils Relative: 1 %
Eosinophils Absolute: 0.6 10*3/uL — ABNORMAL HIGH (ref 0.0–0.5)
Eosinophils Relative: 9 %
HCT: 33.9 % — ABNORMAL LOW (ref 36.0–46.0)
Hemoglobin: 11.6 g/dL — ABNORMAL LOW (ref 12.0–15.0)
Immature Granulocytes: 1 %
Lymphocytes Relative: 23 %
Lymphs Abs: 1.6 10*3/uL (ref 0.7–4.0)
MCH: 30.4 pg (ref 26.0–34.0)
MCHC: 34.2 g/dL (ref 30.0–36.0)
MCV: 88.7 fL (ref 80.0–100.0)
Monocytes Absolute: 0.9 10*3/uL (ref 0.1–1.0)
Monocytes Relative: 13 %
Neutro Abs: 3.6 10*3/uL (ref 1.7–7.7)
Neutrophils Relative %: 53 %
Platelets: 160 10*3/uL (ref 150–400)
RBC: 3.82 MIL/uL — ABNORMAL LOW (ref 3.87–5.11)
RDW: 13 % (ref 11.5–15.5)
WBC: 6.7 10*3/uL (ref 4.0–10.5)
nRBC: 0 % (ref 0.0–0.2)

## 2021-03-31 LAB — COMPREHENSIVE METABOLIC PANEL
ALT: 14 U/L (ref 0–44)
AST: 19 U/L (ref 15–41)
Albumin: 4 g/dL (ref 3.5–5.0)
Alkaline Phosphatase: 44 U/L (ref 38–126)
Anion gap: 9 (ref 5–15)
BUN: 16 mg/dL (ref 8–23)
CO2: 25 mmol/L (ref 22–32)
Calcium: 10 mg/dL (ref 8.9–10.3)
Chloride: 100 mmol/L (ref 98–111)
Creatinine, Ser: 0.77 mg/dL (ref 0.44–1.00)
GFR, Estimated: >60 mL/min (ref >60)
Glucose, Bld: 104 mg/dL — ABNORMAL HIGH (ref 70–99)
Potassium: 3.8 mmol/L (ref 3.5–5.1)
Sodium: 134 mmol/L — ABNORMAL LOW (ref 135–145)
Total Bilirubin: 0.6 mg/dL (ref 0.3–1.2)
Total Protein: 6.9 g/dL (ref 6.5–8.1)

## 2021-03-31 LAB — TSH: TSH: 0.905 u[IU]/mL (ref 0.350–4.500)

## 2021-03-31 LAB — T4, FREE: Free T4: 1.05 ng/dL (ref 0.61–1.12)

## 2021-03-31 MED ORDER — ESOMEPRAZOLE MAGNESIUM 40 MG PO CPDR: 40.0000 mg | DELAYED_RELEASE_CAPSULE | Freq: Every day | ORAL | 3 refills | Status: DC

## 2021-03-31 MED ORDER — SODIUM CHLORIDE 0.9 % IV SOLN
1200.0000 mg | Freq: Once | INTRAVENOUS | Status: AC
  Administered 2021-03-31: 1200 mg via INTRAVENOUS
  Filled 2021-03-31: qty 20

## 2021-03-31 MED ORDER — TRAZODONE HCL 50 MG PO TABS: 50.0000 mg | ORAL_TABLET | Freq: Every evening | ORAL | 2 refills | Status: AC | PRN

## 2021-03-31 MED ORDER — HEPARIN SOD (PORK) LOCK FLUSH 100 UNIT/ML IV SOLN
500.0000 [IU] | Freq: Once | INTRAVENOUS | Status: AC | PRN
  Administered 2021-03-31: 500 [IU]
  Filled 2021-03-31: qty 5

## 2021-03-31 MED ORDER — DEXAMETHASONE SODIUM PHOSPHATE 10 MG/ML IJ SOLN
10.0000 mg | Freq: Once | INTRAMUSCULAR | Status: AC
  Administered 2021-03-31: 10 mg via INTRAVENOUS
  Filled 2021-03-31: qty 1

## 2021-03-31 MED ORDER — SODIUM CHLORIDE 0.9 % IV SOLN
Freq: Once | INTRAVENOUS | Status: AC
  Filled 2021-03-31: qty 250

## 2021-03-31 MED ORDER — SERTRALINE HCL 100 MG PO TABS: 100.0000 mg | ORAL_TABLET | Freq: Every day | ORAL | 3 refills | Status: DC

## 2021-03-31 MED ORDER — HEPARIN SOD (PORK) LOCK FLUSH 100 UNIT/ML IV SOLN
INTRAVENOUS | Status: AC
  Filled 2021-03-31: qty 5

## 2021-03-31 NOTE — Telephone Encounter (Signed)
She has tried omeprazole but symptom has worsened recently any other options?

## 2021-03-31 NOTE — Progress Notes (Signed)
Pt reports she will have a drink or a snack: and spits up clear flem when she lays down has been happening more frequently lately. Burping a lot as well.

## 2021-03-31 NOTE — Telephone Encounter (Signed)
Pharmacy sent fax requesting doctor to change Nexium prescription to Omeprazole as the Nexium is not on her prescription plan as preferred drug. Please send new prescription to pharmacy

## 2021-03-31 NOTE — Progress Notes (Signed)
[facility]  [phone] [phone]   Name: Shevy Yaney [facility] Date: 03/31/2021 MRN: [id number]  DOB: 1946/09/01  Patient Care Team: Glean Hess, MD as PCP - General (Internal Medicine) Charolette Forward, MD as Consulting Physician (Cardiology) Telford Nab, RN as Registered Nurse [name], MD as Radiation Oncologist (Radiation Oncology)    REASON FOR CONSULTATION: Mrs. Kely Dohn is a 75 y.o. 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  PMH also notable for CAD with history of acute MI, claustrophobia, hypertension, and hyperlipidemia.    Patient status post chemo on maintenance immunotherapy.  Palliative care was asked to help address goals and symptoms.  SOCIAL HISTORY:    Patient is married and lives at home with her husband of 25 years.  They moved here from [place], [place] around 4 years ago.  Patient has 2 sons and a daughter.  One son lives in [place] and the other 2 children are in [place].  Patient used to work for a [employer] and then later for the local [place].  ADVANCE DIRECTIVES:  Not on file  CODE STATUS: DNR (MOST form completed on 11/24/18)  PAST MEDICAL HISTORY: Past Medical History:  Diagnosis Date  . Acute MI, inferoposterior wall ([place]) 09/30/2014   1 stent  . Claustrophobia   . Coronary artery disease   . GERD (gastroesophageal reflux disease)   . Hypercholesteremia   . MI, old   . Pneumonia   . Small cell lung cancer ([place])   . 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 [facility]) 10/27/2018    PAST SURGICAL HISTORY:  Past Surgical History:  Procedure Laterality Date  . APPENDECTOMY     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  . BLADDER NECK SUSPENSION    . CHOLECYSTECTOMY N/A 08/14/2018   Procedure: LAPAROSCOPIC CHOLECYSTECTOMY;  Surgeon: Jules Husbands, MD;  Location: [facility] ORS;   Service: General;  Laterality: N/A;  . CHOLECYSTECTOMY  11/2018  . CORONARY ANGIOPLASTY  09/29/2014  . CORONARY STENT PLACEMENT    . ENDOBRONCHIAL ULTRASOUND N/A 10/21/2018   Procedure: ENDOBRONCHIAL ULTRASOUND;  Surgeon: Laverle Hobby, MD;  Location: [facility] ORS;  Service: Pulmonary;  Laterality: N/A;  . HERNIA REPAIR  2012  . IR FLUORO GUIDE CV LINE RIGHT  12/19/2018  . KYPHOPLASTY N/A 03/01/2020   Procedure: T7 KYPHOPLASTY;  Surgeon: Hessie Knows, MD;  Location: [facility] ORS;  Service: Orthopedics;  Laterality: N/A;  . LAPAROTOMY    . LEFT HEART CATHETERIZATION WITH CORONARY ANGIOGRAM N/A 09/29/2014   Procedure: LEFT HEART CATHETERIZATION WITH CORONARY ANGIOGRAM;  Surgeon: Clent Demark, MD;  Location: [facility];  Service: Cardiovascular;  Laterality: N/A;  . PORTA CATH INSERTION N/A 01/02/2019   Procedure: PORTA CATH INSERTION;  Surgeon: Algernon Huxley, MD;  Location: [facility] CV LAB;  Service: Cardiovascular;  Laterality: N/A;  . PORTACATH PLACEMENT Right 10/28/2018   Procedure: INSERTION PORT-A-CATH;  Surgeon: Jules Husbands, MD;  Location: [facility] ORS;  Service: General;  Laterality: Right;  . XI ROBOTIC ASSISTED VENTRAL HERNIA N/A 02/27/2021   Procedure: XI ROBOTIC ASSISTED VENTRAL HERNIA (incisional) WITH MESH;  Surgeon: Olean Ree, MD;  Location: [facility] ORS;  Service: General;  Laterality: N/A;    HEMATOLOGY/ONCOLOGY HISTORY:  Oncology History Overview Note  MARY-ANN PENNELLA is a  75 y.o.  female with PMH listed below who was referred to me for evaluation of  small cell lung cancer.   10/20/2018 CT chest with contrast showed large mediastinal mass involving both hilar, left greater than right, consistent with lung carcinoma, The mass causes narrowing of the left mainstem bronchus with resultant volume loss on the left and a mediastinal shift to the left.  Moderate size left pleural effusion. Patient underwent E bus bronchoscopy 10/21/2018 Left mainstem bronchus transbronchial  forcep biopsy showed small cell carcinoma.   # Nov 2019- Jan 2020 s/p 4 cycles of Carbo/Etoposide/Tecentriq #  01/24/2019 interim CT scan done which showed continued positive response to therapy with continued reduction in mediastinal adenopathy.  No residual measurable left lung mass. CT findings of acute emphysematous cystitis. Urology did not feel that patient has pyelonephritis and recommend treatment with antibiotics because patient's immunocompromise. Patient finished treatment.    # Chest and whole brain radiation finished in May 2020 # 04/25/2019 resume Tecentriq every 3 weeks.   Small cell lung cancer ([place])  10/27/2018 Initial Diagnosis   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 [facility])   11/02/2018 -  Chemotherapy    Patient is on Treatment Plan: LUNG SCLC CARBOPLATIN + ETOPOSIDE + ATEZOLIZUMAB INDUCTION Q21D / ATEZOLIZUMAB MAINTENANCE Q21D        ALLERGIES:  has No Known Allergies.  MEDICATIONS:  Current Outpatient Medications  Medication Sig Dispense Refill  . acetaminophen (TYLENOL) 500 MG tablet Take 2 tablets (1,000 mg total) by mouth every 6 (six) hours as needed for mild pain or headache.    . Calcium 600-200 MG-UNIT tablet Take 1 tablet by mouth 2 (two) times daily.    . Cholecalciferol (DIALYVITE VITAMIN D 5000) 125 MCG (5000 UT) capsule Take 5,000 Units by mouth daily.    [name] Kitchen esomeprazole (NEXIUM) 40 MG capsule Take 1 capsule (40 mg total) by mouth daily at 12 noon. 30 capsule 3  . ibuprofen (ADVIL) 600 MG tablet Take 1 tablet (600 mg total) by mouth every 8 (eight) hours as needed for moderate pain. 60 tablet 1  . lidocaine-prilocaine (EMLA) cream Apply 1 application topically daily as needed (port access). 30 g 2  . Magnesium 250 MG TABS Take 250 mg by mouth daily.    [name] Kitchen oxyCODONE (ROXICODONE) 5 MG immediate release tablet Take 1 tablet (5 mg total) by mouth every 6 (six) hours as needed for severe pain. 30 tablet 0  . prochlorperazine (COMPAZINE) 10 MG tablet Take 10 mg by mouth  every 6 (six) hours as needed for nausea or vomiting.    . sucralfate (CARAFATE) 1 g tablet Take 1 g by mouth 3 (three) times daily as needed (heartburn).    . vitamin B-12 (CYANOCOBALAMIN) 1000 MCG tablet Take 1,000 mcg by mouth daily.     No current facility-administered medications for this visit.   Facility-Administered Medications Ordered in Other Visits  Medication Dose Route Frequency Provider Last Rate Last Admin  . heparin lock flush 100 unit/mL  500 Units Intravenous Once Earlie Server, MD      . heparin lock flush 100 unit/mL  500 Units Intravenous Once Earlie Server, MD      . sodium chloride flush (NS) 0.9 % injection 10 mL  10 mL Intravenous PRN Earlie Server, MD   10 mL at 01/09/19 0820  . sodium chloride flush (NS) 0.9 % injection 10 mL  10 mL Intravenous PRN Earlie Server, MD   10 mL at 01/10/19 1400    VITAL SIGNS: There were no vitals taken for this visit. There were no vitals filed for this  visit.  Estimated body mass index is 24 kg/m as calculated from the following:   Height as of 03/14/21: 5\' 6"  (1.676 m).   Weight as of an earlier encounter on 03/31/21: 148 lb 11.2 oz (67.4 kg).  LABS: CBC:    Component Value Date/Time   WBC 6.7 03/31/2021 1003   HGB 11.6 (L) 03/31/2021 1003   HGB 14.5 09/19/2018 1038   HCT 33.9 (L) 03/31/2021 1003   HCT 42.7 09/19/2018 1038   PLT 160 03/31/2021 1003   PLT 265 09/19/2018 1038   MCV 88.7 03/31/2021 1003   MCV 91 09/19/2018 1038   NEUTROABS 3.6 03/31/2021 1003   NEUTROABS 5.1 09/19/2018 1038   LYMPHSABS 1.6 03/31/2021 1003   LYMPHSABS 2.0 09/19/2018 1038   MONOABS 0.9 03/31/2021 1003   EOSABS 0.6 (H) 03/31/2021 1003   EOSABS 0.3 09/19/2018 1038   BASOSABS 0.1 03/31/2021 1003   BASOSABS 0.1 09/19/2018 1038   Comprehensive Metabolic Panel:    Component Value Date/Time   NA 134 (L) 03/31/2021 1003   NA 136 09/19/2018 1038   K 3.8 03/31/2021 1003   CL 100 03/31/2021 1003   CO2 25 03/31/2021 1003   BUN 16 03/31/2021 1003   BUN 6 (L)  09/19/2018 1038   CREATININE 0.77 03/31/2021 1003   GLUCOSE 104 (H) 03/31/2021 1003   CALCIUM 10.0 03/31/2021 1003   AST 19 03/31/2021 1003   ALT 14 03/31/2021 1003   ALKPHOS 44 03/31/2021 1003   BILITOT 0.6 03/31/2021 1003   BILITOT 0.5 09/19/2018 1038   PROT 6.9 03/31/2021 1003   PROT 7.4 09/19/2018 1038   ALBUMIN 4.0 03/31/2021 1003   ALBUMIN 4.3 09/19/2018 1038    RADIOGRAPHIC STUDIES: No results found.  PERFORMANCE STATUS (ECOG) : 2-3  Review of Systems Unless otherwise noted, a complete review of systems is negative.  Physical Exam General: NAD, frail appearing, thin Pulmonary: Unlabored Extremities: no edema, no joint deformities Skin: no rashes Neurological: Weakness but otherwise nonfocal  IMPRESSION: Patient was an add-on to my clinic schedule today at her request.  She was seen in infusion.  Patient endorses worse depressive symptoms over the past several weeks to months.  She was having good response from Zoloft 75 mg daily but now feels more episodes of anxiety.  Patient has been on stable dose of Zoloft for over 6 months.  She has had more irritability with her husband.  She is having difficulty initiating sleep at night.  She says she often lies awake and thinks about things.  She is napping some during the day and watching television at night.  No SI/HI.  Will increase dose of sertraline to 100 mg daily.  We will add trazodone 50 mg nightly as needed for sleep.  Discussed importance of maintaining proper sleep hygiene and avoiding daytime napping.  PLAN: -Continue current scope of treatment -Increase sertraline 100 mg daily -Start trazodone 50 mg nightly as needed for sleep -RTC in 3 weeks   Patient expressed understanding and was in agreement with this plan. She also understands that She can call the clinic at any time with any questions, concerns, or complaints.     Time Total: 15 minutes  Visit consisted of counseling and education dealing with  the complex and emotionally intense issues of symptom management and palliative care in the setting of serious and potentially life-threatening illness.Greater than 50%  of this time was spent counseling and coordinating care related to the above assessment and plan.  Signed by:  Altha Harm, PhD, NP-C [phone] (Work Cell)

## 2021-03-31 NOTE — Progress Notes (Signed)
Hematology/Oncology Follow up note [facility] Telephone:([phone]) [phone] Fax:(336) [phone]   Patient Care Team: Glean Hess, MD as PCP - General (Internal Medicine) Charolette Forward, MD as Consulting Physician (Cardiology) Telford Nab, RN as Registered Nurse [name], MD as Radiation Oncologist (Radiation Oncology)  REFERRING PROVIDER: Dr. Felicie Morn REASON FOR VISIT:  Follow-up for small cell lung cancer,   HISTORY OF PRESENTING ILLNESS:  Kristi [name] is a  75 y.o.  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  10/20/2018 CT chest with contrast showed large mediastinal mass involving both hilar, left greater than right, consistent with lung carcinoma, The mass causes narrowing of the left mainstem bronchus with resultant volume loss on the left and a mediastinal shift to the left.  Moderate size left pleural effusion. 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  # Initial MRI brain negative.  # Nov 2019- Jan 2020 s/p 4 cycles of Carbo/Etoposide/Tecentriq #  01/24/2019 interim CT scan done which showed continued positive response to therapy with continued reduction in mediastinal adenopathy.  No residual measurable left lung mass. CT findings of acute emphysematous cystitis. Urology did not feel that patient has pyelonephritis and recommend treatment with antibiotics because patient's immunocompromise. Patient finished treatment.   # 11/02/2018-01/09/2019 Chemotherapy carboplatin + Etoposide + tecentriq # Consolidation chest radiation and whole brain radiation finished in May 2020 # 04/25/2019 resume Tecentriq every 3 weeks. # Patient had a fall from her stairs on 01/19/2020. In the emergency room she had CT chest abdomen pelvis CT, CT head without contrast, CT cervical spine without contrast. No CT evidence for acute  intracranial abnormality, degenerative changes of cervical spine..  No CT evidence of acute thoracic abdomen injury.  Severe compression fracture of T7, subacute.  # kyphoplasty procedure on 03/01/2020.  T7 biopsy negative for cancer.  11/20/2020 Surveillance CT scan  Reduced size and prominence of right lateral lung base subpleural nodular density. No findings of active malignancy.  MRI brain showed stable post treatment brain. No metastatic disease.    INTERVAL HISTORY Kristi [name] is a 75 y.o. female who has above history reviewed by me today presents for evaluation prior to immunotherapy for treatment of extensive small cell lung cancer. Patient is on immunotherapy maintenance. recent robotic assisted incisional hernia repair with mesh, she takes ibuprofen prn for pain.  Reports worsening heart burn and burping symptoms. On Omeprazole. No nausea, vomiting diarrhea.    .  Review of Systems  Constitutional: Positive for fatigue. Negative for appetite change, chills and fever.  HENT:   Negative for hearing loss and voice change.   Eyes: Negative for eye problems.  Respiratory: Negative for chest tightness, cough and shortness of breath.   Cardiovascular: Negative for chest pain.  Gastrointestinal: Negative for abdominal distention, abdominal pain, blood in stool, diarrhea and nausea.  Endocrine: Negative for hot flashes.  Genitourinary: Negative for difficulty urinating and frequency.   Musculoskeletal: Negative for arthralgias and back pain.  Skin: Negative for itching and rash.  Neurological: Negative for extremity weakness and headaches.  Hematological: Negative for adenopathy.  Psychiatric/Behavioral: Negative for confusion and depression. The patient is not nervous/anxious.        Forgetful     MEDICAL HISTORY:  Past Medical History:  Diagnosis Date  . Acute MI, inferoposterior wall ([place]) 09/30/2014   1 stent  . Claustrophobia   . Coronary artery disease   .  GERD  (gastroesophageal reflux disease)   . Hypercholesteremia   . MI, old   . Pneumonia   . Small cell lung cancer ([place])   . 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 [facility]) 10/27/2018    SURGICAL HISTORY: Past Surgical History:  Procedure Laterality Date  . APPENDECTOMY     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  . BLADDER NECK SUSPENSION    . CHOLECYSTECTOMY N/A 08/14/2018   Procedure: LAPAROSCOPIC CHOLECYSTECTOMY;  Surgeon: Jules Husbands, MD;  Location: [facility] ORS;  Service: General;  Laterality: N/A;  . CHOLECYSTECTOMY  11/2018  . CORONARY ANGIOPLASTY  09/29/2014  . CORONARY STENT PLACEMENT    . ENDOBRONCHIAL ULTRASOUND N/A 10/21/2018   Procedure: ENDOBRONCHIAL ULTRASOUND;  Surgeon: Laverle Hobby, MD;  Location: [facility] ORS;  Service: Pulmonary;  Laterality: N/A;  . HERNIA REPAIR  2012  . IR FLUORO GUIDE CV LINE RIGHT  12/19/2018  . KYPHOPLASTY N/A 03/01/2020   Procedure: T7 KYPHOPLASTY;  Surgeon: Hessie Knows, MD;  Location: [facility] ORS;  Service: Orthopedics;  Laterality: N/A;  . LAPAROTOMY    . LEFT HEART CATHETERIZATION WITH CORONARY ANGIOGRAM N/A 09/29/2014   Procedure: LEFT HEART CATHETERIZATION WITH CORONARY ANGIOGRAM;  Surgeon: Clent Demark, MD;  Location: [facility] CATH LAB;  Service: Cardiovascular;  Laterality: N/A;  . PORTA CATH INSERTION N/A 01/02/2019   Procedure: PORTA CATH INSERTION;  Surgeon: Algernon Huxley, MD;  Location: [place] CV LAB;  Service: Cardiovascular;  Laterality: N/A;  . PORTACATH PLACEMENT Right 10/28/2018   Procedure: INSERTION PORT-A-CATH;  Surgeon: Jules Husbands, MD;  Location: [facility] ORS;  Service: General;  Laterality: Right;  . XI ROBOTIC ASSISTED VENTRAL HERNIA N/A 02/27/2021   Procedure: XI ROBOTIC ASSISTED VENTRAL HERNIA (incisional) WITH MESH;  Surgeon: Olean Ree, MD;  Location: [facility] ORS;  Service: General;  Laterality: N/A;    SOCIAL HISTORY: Social History   Socioeconomic History  . Marital status: Married    Spouse name: [name]   . Number of children: 3   . Years of education: Not on file  . Highest education level: Not on file  Occupational History  . Occupation: Retired    Comment: Chief Technology Officer   Tobacco Use  . Smoking status: Former Smoker    Packs/day: 1.00    Years: 39.00    Pack years: 39.00    Types: Cigarettes    Start date: 12/21/1978    Quit date: 10/16/2018    Years since quitting: 2.4  . Smokeless tobacco: Never Used  Vaping Use  . Vaping Use: Never used  Substance and Sexual Activity  . Alcohol use: Yes    Alcohol/week: 0.0 standard drinks    Comment: occassional - approx 1 every 2 weeks   . Drug use: No  . Sexual activity: Yes    Birth control/protection: None  Other Topics Concern  . Not on file  Social History Narrative  . Not on file   Social Determinants of Health   Financial Resource Strain: Not on file  Food Insecurity: Not on file  Transportation Needs: Not on file  Physical Activity: Not on file  Stress: Not on file  Social Connections: Not on file  Intimate Partner Violence: Not on file    FAMILY HISTORY: Family History  Problem Relation Age of Onset  . Alzheimer's disease Mother   . Colon cancer Father   . Breast cancer Neg Hx     ALLERGIES:  has No Known Allergies.  MEDICATIONS:  Current Outpatient Medications  Medication Sig  Dispense Refill  . acetaminophen (TYLENOL) 500 MG tablet Take 2 tablets (1,000 mg total) by mouth every 6 (six) hours as needed for mild pain or headache.    . Calcium 600-200 MG-UNIT tablet Take 1 tablet by mouth 2 (two) times daily.    . Cholecalciferol (DIALYVITE VITAMIN D 5000) 125 MCG (5000 UT) capsule Take 5,000 Units by mouth daily.    [name] Kitchen esomeprazole (NEXIUM) 40 MG capsule Take 1 capsule (40 mg total) by mouth daily at 12 noon. 30 capsule 3  . ibuprofen (ADVIL) 600 MG tablet Take 1 tablet (600 mg total) by mouth every 8 (eight) hours as needed for moderate pain. 60 tablet 1  . lidocaine-prilocaine (EMLA) cream Apply 1 application topically daily as needed  (port access). 30 g 2  . Magnesium 250 MG TABS Take 250 mg by mouth daily.    [name] Kitchen oxyCODONE (ROXICODONE) 5 MG immediate release tablet Take 1 tablet (5 mg total) by mouth every 6 (six) hours as needed for severe pain. 30 tablet 0  . prochlorperazine (COMPAZINE) 10 MG tablet Take 10 mg by mouth every 6 (six) hours as needed for nausea or vomiting.    . sucralfate (CARAFATE) 1 g tablet Take 1 g by mouth 3 (three) times daily as needed (heartburn).    . vitamin B-12 (CYANOCOBALAMIN) 1000 MCG tablet Take 1,000 mcg by mouth daily.    . sertraline (ZOLOFT) 100 MG tablet Take 1 tablet (100 mg total) by mouth daily. 90 tablet 3  . traZODone (DESYREL) 50 MG tablet Take 1 tablet (50 mg total) by mouth at bedtime as needed for sleep. 30 tablet 2   No current facility-administered medications for this visit.   Facility-Administered Medications Ordered in Other Visits  Medication Dose Route Frequency Provider Last Rate Last Admin  . heparin lock flush 100 unit/mL  500 Units Intravenous Once Earlie Server, MD      . heparin lock flush 100 unit/mL  500 Units Intravenous Once Earlie Server, MD      . sodium chloride flush (NS) 0.9 % injection 10 mL  10 mL Intravenous PRN Earlie Server, MD   10 mL at 01/09/19 0820  . sodium chloride flush (NS) 0.9 % injection 10 mL  10 mL Intravenous PRN Earlie Server, MD   10 mL at 01/10/19 1400     PHYSICAL EXAMINATION: ECOG PERFORMANCE STATUS: 1 - Symptomatic but completely ambulatory Vitals:   03/31/21 1023  BP: 105/71  Pulse: 76  Resp: 20  Temp: 98 F (36.7 C)  SpO2: 95%   Filed Weights   03/31/21 1023  Weight: 148 lb 11.2 oz (67.4 kg)   Physical Examination Today's Vitals   03/31/21 1023 03/31/21 1026  BP: 105/71   Pulse: 76   Resp: 20   Temp: 98 F (36.7 C)   SpO2: 95%   Weight: 148 lb 11.2 oz (67.4 kg)   PainSc: 0-No pain 0-No pain   Body mass index is 24 kg/m.   Physical Exam Constitutional:      General: She is not in acute distress.    Appearance: She is  not diaphoretic.     Comments: Patient sits in the wheelchair.  HENT:     Head: Normocephalic and atraumatic.     Nose: Nose normal.     Mouth/Throat:     Pharynx: No oropharyngeal exudate.  Eyes:     General: No scleral icterus.    Pupils: Pupils are equal, round, and reactive to light.  Cardiovascular:  Rate and Rhythm: Normal rate and regular rhythm.     Heart sounds: No murmur heard.   Pulmonary:     Effort: Pulmonary effort is normal. No respiratory distress.     Breath sounds: No wheezing or rales.     Comments: Decreased breath sounds bilaterally.  Abdominal:     General: There is no distension.     Palpations: Abdomen is soft.     Tenderness: There is no abdominal tenderness.  Musculoskeletal:        General: Normal range of motion.     Cervical back: Normal range of motion and neck supple.  Skin:    General: Skin is warm and dry.     Findings: No erythema.  Neurological:     Mental Status: She is alert and oriented to person, place, and time.     Cranial Nerves: No cranial nerve deficit.     Motor: No abnormal muscle tone.     Coordination: Coordination normal.  Psychiatric:        Mood and Affect: Affect normal.        LABORATORY DATA:  I have reviewed the data as listed Lab Results  Component Value Date   WBC 6.7 03/31/2021   HGB 11.6 (L) 03/31/2021   HCT 33.9 (L) 03/31/2021   MCV 88.7 03/31/2021   PLT 160 03/31/2021   Recent Labs    08/12/20 1315 09/02/20 1358 09/23/20 0845 10/14/20 0839 03/03/21 0839 03/10/21 0910 03/31/21 1003  NA 134* 137 138   < > 135 136 134*  K 4.0 3.5 4.0   < > 3.9 3.9 3.8  CL 104 105 106   < > 103 101 100  CO2 23 24 24    < > 24 24 25   GLUCOSE 98 103* 91   < > 108* 98 104*  BUN 13 18 14    < > 14 11 16   CREATININE 0.81 0.68 0.75   < > 1.09* 0.89 0.77  CALCIUM 8.8* 8.3* 8.6*   < > 9.2 9.8 10.0  GFRNONAA >60 >60 >60   < > 53* >60 >60  GFRAA >60 >60 >60  --   --   --   --   PROT 7.4 6.8 7.0   < > 7.0 7.2 6.9   ALBUMIN 4.0 3.7 3.8   < > 3.6 3.8 4.0  AST 17 14* 17   < > 18 20 19   ALT 12 11 11    < > 13 16 14   ALKPHOS 49 49 47   < > 52 47 44  BILITOT 0.5 0.5 0.6   < > 0.7 0.7 0.6   < > = values in this interval not displayed.    RADIOGRAPHIC STUDIES: I have personally reviewed the radiological images as listed and agreed with the findings in the report.  CT CHEST ABDOMEN PELVIS W CONTRAST  Result Date: 02/26/2021 CLINICAL DATA:  Restaging small cell lung cancer. EXAM: CT CHEST, ABDOMEN, AND PELVIS WITH CONTRAST TECHNIQUE: Multidetector CT imaging of the chest, abdomen and pelvis was performed following the standard protocol during bolus administration of intravenous contrast. CONTRAST:  167mL OMNIPAQUE IOHEXOL 300 MG/ML  SOLN COMPARISON:  11/20/2020 FINDINGS: CT CHEST FINDINGS Cardiovascular: The heart is normal in size. No pericardial effusion. Stable tortuosity, ectasia and calcification of the thoracic aorta and stable branch vessel calcifications including extensive three-vessel coronary artery calcifications. Mediastinum/Nodes: No mediastinal or hilar mass or adenopathy. There is chronic soft tissue thickening around the mid esophagus likely related  to treated tumor or possibly radiation affect. The esophagus is grossly normal. Lungs/Pleura: Chronic emphysematous changes and pulmonary scarring. No findings suspicious for recurrent lung cancer or pulmonary metastatic disease. Stable calcified granuloma in the right middle lobe. Musculoskeletal: Age age stable vertebral augmentation changes at T7 and remote T4 fracture. Remote partially united sternal fracture. No acute bony findings or worrisome bone lesions. Moderate osteoporosis. CT ABDOMEN PELVIS FINDINGS Hepatobiliary: No findings suspicious for hepatic metastatic disease. The gallbladder is surgically absent. No common bile duct dilatation. Pancreas: Moderate pancreatic atrophy but no mass or inflammation. Spleen: Normal size. No focal lesions. Small  accessory spleen noted. Adrenals/Urinary Tract: Adrenal glands are unremarkable and stable. No renal lesions or hydronephrosis. No bladder lesions or calculi. Mild trabeculation. Stomach/Bowel: The stomach, duodenum, small bowel and colon are unremarkable. Vascular/Lymphatic: Advanced atherosclerotic calcifications involving the aorta and iliac arteries but no aneurysm or dissection. Extensive branch vessel calcifications are also noted. The major venous structures are patent. No mesenteric or retroperitoneal mass or adenopathy. Reproductive: The uterus and ovaries are unremarkable. Other: Stable appearing moderate-sized anterior abdominal wall hernia containing small bowel loops but no findings for incarceration or obstruction. Musculoskeletal: No acute bony findings or worrisome bone lesions. IMPRESSION: 1. Stable CT appearance of the chest, abdomen and pelvis. No findings suspicious for recurrent lung cancer or pulmonary metastatic disease. 2. Stable chronic soft tissue thickening around the mid esophagus likely related to treated tumor or possibly radiation effect. 3. Stable advanced atherosclerotic calcifications involving the thoracic and abdominal aorta and branch vessels including extensive three-vessel coronary artery calcifications. 4. Stable moderate-sized anterior abdominal wall hernia containing small bowel loops but no findings for incarceration or obstruction. 5. Emphysema and aortic atherosclerosis. Aortic Atherosclerosis (ICD10-I70.0) and Emphysema (ICD10-J43.9). Electronically Signed   By: Marijo Sanes M.D.   On: 02/26/2021 15:16     ASSESSMENT & PLAN:  1. Small cell lung cancer ([place])   2. Encounter for antineoplastic immunotherapy   3. 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 Osteopenia, unspecified location   5. Gastroesophageal reflux disease, unspecified whether esophagitis present    #Extensive small cell lung cancer, S/p 4 cycles of Carboplatin, Etoposide and Tecentriq.    Currently on Tecentriq maintenance. Stable disease on recent CT scan.   Labs are reviewed and discussed with patient. Proceed with Tecentriq maintenance.    # History bone metastasis, #Osteopenia  zometa every 4-6 weeks.  continue calcium and vitamin D.   Zometa next week.  #GERD, on omeprazole, symptom is not controlled. Stop omeprazol and switch to nexium.  Recommend patient to hold off ibuprofen. Take tylenol Q6 hours for pain if needed.  # COPD/emphysema, Stable. Monitor.  Follow up in 3 weeks for next cycle of Tecentriq.  Earlie Server, MD, PhD

## 2021-04-01 NOTE — Telephone Encounter (Signed)
I called patient insurance company [employer] and was told that Pantoprazole and Lansoprazol are both covered by her insurance

## 2021-04-01 NOTE — Telephone Encounter (Signed)
Ok. Would you please send her a Rx of Pantoprazole 40mg  BID. Dc nexium thanks

## 2021-04-02 MED ORDER — PANTOPRAZOLE SODIUM 40 MG PO TBEC: 40.0000 mg | DELAYED_RELEASE_TABLET | Freq: Two times a day (BID) | ORAL | 0 refills | Status: AC

## 2021-04-03 ENCOUNTER — Ambulatory Visit
Admission: RE | Admit: 2021-04-03 | Discharge: 2021-04-03 | Disposition: A | Discharge status: Home / Self Care | Payer: Medicare Other | Source: Ambulatory Visit | Attending: Radiation Oncology | Admitting: Radiation Oncology

## 2021-04-03 ENCOUNTER — Other Ambulatory Visit: Payer: Self-pay

## 2021-04-03 ENCOUNTER — Encounter: Payer: Self-pay | Admitting: Radiation Oncology

## 2021-04-03 VITALS — BP 102/75 | HR 86 | Temp 96.6°F | Resp 18 | Wt 144.8 lb

## 2021-04-03 DIAGNOSIS — Z9221 Personal history of antineoplastic chemotherapy: Secondary | ICD-10-CM | POA: Insufficient documentation

## 2021-04-03 DIAGNOSIS — Z85118 Personal history of other malignant neoplasm of bronchus and lung: Secondary | ICD-10-CM | POA: Diagnosis not present

## 2021-04-03 DIAGNOSIS — R51 Headache with orthostatic component, not elsewhere classified: Secondary | ICD-10-CM | POA: Insufficient documentation

## 2021-04-03 DIAGNOSIS — Z923 Personal history of irradiation: Secondary | ICD-10-CM | POA: Insufficient documentation

## 2021-04-03 DIAGNOSIS — Z08 Encounter for follow-up examination after completed treatment for malignant neoplasm: Secondary | ICD-10-CM | POA: Diagnosis not present

## 2021-04-03 DIAGNOSIS — C3490 Malignant neoplasm of unspecified part of unspecified bronchus or lung: Secondary | ICD-10-CM

## 2021-04-03 NOTE — Progress Notes (Signed)
Radiation Oncology Follow up Note  Name: Kristi [name] [facility]   Date:   04/03/2021 MRN:  [id number] DOB: 12-Oct-1946    This 75 y.o. female presents to the clinic today for 2-year follow-up status post concurrent chemoradiation as well as PCI for extensive stage small cell lung cancer.  REFERRING PROVIDER: Glean Hess, MD  HPI: Patient is a 75 year old female now out 2 years having completed both concurrent chemoradiation as well as PCI for extensive stage small cell lung cancer seen today in routine follow-up she is clinically doing well occasionally has some headaches controlled with Tylenol.  No change in neurologic status no cough hemoptysis or chest tightness..  Recent CT scans showed no findings suspicious for recurrent lung cancer or pulmonary metastatic disease.  She is currently on Tecentriq maintenance tolerating that well.  She is having no bone pain at this time.  MRI scan back in December which I have reviewed also shows no evidence to suggest metastatic disease.  COMPLICATIONS OF TREATMENT: none  FOLLOW UP COMPLIANCE: keeps appointments   PHYSICAL EXAM:  BP 102/75 (BP Location: Left Arm, Patient Position: Sitting)   Pulse 86   Temp (!) 96.6 F (35.9 C) (Tympanic)   Resp 18   Wt 144 lb 12.8 oz (65.7 kg)   BMI 23.37 kg/m  Well-developed well-nourished patient in NAD. HEENT reveals PERLA, EOMI, discs not visualized.  Oral cavity is clear. No oral mucosal lesions are identified. Neck is clear without evidence of cervical or supraclavicular adenopathy. Lungs are clear to A&P. Cardiac examination is essentially unremarkable with regular rate and rhythm without murmur rub or thrill. Abdomen is benign with no organomegaly or masses noted. Motor sensory and DTR levels are equal and symmetric in the upper and lower extremities. Cranial nerves II through XII are grossly intact. Proprioception is intact. No peripheral adenopathy or edema is identified. No motor or sensory levels are  noted. Crude visual fields are within normal range.  RADIOLOGY RESULTS: CT scans of chest and MRI of brain reviewed compatible with above-stated findings  PLAN: Present time patient is stable with no evidence of disease currently on maintenance Tecentriq.  I am going to see her back in 1 year for follow-up.  She continues close follow-up care observation and treatment with medical oncology.  Patient knows to call with any concerns.  I would like to take this opportunity to thank you for allowing me to participate in the care of your patient.[name], MD

## 2021-04-11 ENCOUNTER — Ambulatory Visit (INDEPENDENT_AMBULATORY_CARE_PROVIDER_SITE_OTHER): Payer: Medicare Other | Admitting: Surgery

## 2021-04-11 ENCOUNTER — Other Ambulatory Visit: Payer: Self-pay

## 2021-04-11 ENCOUNTER — Encounter: Payer: Self-pay | Admitting: Surgery

## 2021-04-11 VITALS — BP 118/79 | HR 82 | Temp 97.8°F | Ht 67.0 in | Wt 145.6 lb

## 2021-04-11 DIAGNOSIS — Z09 Encounter for follow-up examination after completed treatment for conditions other than malignant neoplasm: Secondary | ICD-10-CM

## 2021-04-11 DIAGNOSIS — K432 Incisional hernia without obstruction or gangrene: Secondary | ICD-10-CM

## 2021-04-11 NOTE — Progress Notes (Signed)
04/11/2021  HPI: Kristi [name] is a 75 y.o. female s/p robotic assisted incisional hernia repair on 02/27/21.  Patient presents today for follow up.  She reports that she's having some pain still in the LUQ at the site of one of the ports.  This is not constant and happens about every other day depending on how she's moving.  Describes also some occasional soreness in the umbilical area.  Denies any worsening pain, distention, bulging.  Vital signs: BP 118/79   Pulse 82   Temp 97.8 F (36.6 C) (Oral)   Ht 5\' 7"  (1.702 m)   Wt 145 lb 9.6 oz (66 kg)   SpO2 98%   BMI 22.80 kg/m    Physical Exam: Constitutional: No acute distress Abdomen:  Soft, non-distended, with soreness to palpation just medial to the LUQ 12 mm port site, some minor discomfort at the umbilicus.  No evidence of hernia recurrence, or any wound complications.  Assessment/Plan: This is a 75 y.o. female s/p robotic assisted incisional hernia repair.  --Discussed with the patient that this likely is related to the sutures that are closing the fascial defect of the 12 mm port site.  These sutures are transfascial/transabdominal and may be pinching more tissue together.  As the suture dissolves, the pain should get better.  --No evidence of recurrence at this point. --Recommended that she contact us if this intermittent discomfort does not get better over the next couple of months.  Otherwise follow up as needed.   Melvyn Neth, [facility]

## 2021-04-11 NOTE — Patient Instructions (Signed)
If you have any concerns or questions, please feel free to call our office.     Laparoscopic Ventral Hernia Repair, Care After The following information offers guidance on how to care for yourself after your procedure. Your health care provider may also give you more specific instructions. If you have problems or questions, contact your health care provider. What can I expect after the procedure? After the procedure, it is common to have pain, discomfort, or soreness. Follow these instructions at home: Medicines  Take over-the-counter and prescription medicines only as told by your health care provider.  Ask your health care provider if the medicine prescribed to you: ? Requires you to avoid driving or using machinery. ? Can cause constipation. You may need to take these actions to prevent or treat constipation:  Drink enough fluid to keep your urine pale yellow.  Take over-the-counter or prescription medicines.  Eat foods that are high in fiber, such as beans, whole grains, and fresh fruits and vegetables.  Limit foods that are high in fat and processed sugars, such as fried or sweet foods. Incision care  Follow instructions from your health care provider about how to take care of your incisions. Make sure you: ? Wash your hands with soap and water for at least 20 seconds before and after you change your bandage (dressing) or before you touch your abdomen. If soap and water are not available, use hand sanitizer. ? Change your dressing as told by your health care provider. ? Leave stitches (sutures), skin glue, or adhesive strips in place. These skin closures may need to stay in place for 2 weeks or longer. If adhesive strip edges start to loosen and curl up, you may trim the loose edges. Do not remove adhesive strips completely unless your health care provider tells you to do that.  Check your incision areas every day for signs of infection. Check for: ? More redness, swelling, or  pain. ? Fluid or blood. ? Warmth. ? Pus or a bad smell.   Bathing  Do not take baths, swim, or use a hot tub until your health care provider approves. Ask your health care provider if you may take showers. You may only be allowed to take sponge baths.  Keep your dressing dry until your health care provider says it can be removed.   Activity  Rest as told by your health care provider.  Avoid sitting for a long time without moving. Get up to take short walks every 1-2 hours. This is important to improve blood flow and breathing. Ask for help if you feel weak or unsteady.  Do not lift anything that is heavier than 10 lb (4.5 kg), or the limit that you are told, until your health care provider says that it is safe.  If you were given a sedative during the procedure, it can affect you for several hours. Do not drive or operate machinery until your health care provider says that it is safe.  Return to your normal activities as told by your health care provider. Ask your health care provider what activities are safe for you.   General instructions  Hold a pillow over your abdomen when you cough or sneeze. This helps with pain.  Do not use any products that contain nicotine or tobacco. These products include cigarettes, chewing tobacco, and vaping devices, such as e-cigarettes. These can delay healing after surgery. If you need help quitting, ask your health care provider.  You may be asked to continue  to do deep breathing exercises at home. This will help to prevent a lung infection.  Keep all follow-up visits. This is important.   Contact a health care provider if:  You have any of these signs of infection: ? More redness, swelling, or pain around an incision. ? Fluid or blood coming from an incision. ? Warmth coming from an incision. ? Pus or a bad smell coming from an incision. ? A fever or chills.  You have pain that gets worse or does not get better with medicine.  You have  nausea or vomiting.  You have a cough.  You have shortness of breath.  You have not had a bowel movement in 3 days.  You are not able to urinate. Get help right away if you have:  Severe pain in your abdomen.  Persistent nausea and vomiting.  Redness, warmth, or pain in your leg.  Chest pain.  Trouble breathing. These symptoms may represent a serious problem that is an emergency. Do not wait to see if the symptoms will go away. Get medical help right away. Call your local emergency services (911 in the [place] Do not drive yourself to the hospital. Summary  After this procedure, it is common to have pain, discomfort, or soreness.  Follow instructions from your health care provider about how to take care of your incision.  Check your incision area every day for signs of infection. Report any signs of infection to your health care provider.  Keep all follow-up visits. This is important. This information is not intended to replace advice given to you by your health care provider. Make sure you discuss any questions you have with your health care provider. Document Revised: 07/26/2020 Document Reviewed: 07/26/2020 Elsevier Patient Education  2021 [employer].

## 2021-04-21 ENCOUNTER — Inpatient Hospital Stay (HOSPITAL_BASED_OUTPATIENT_CLINIC_OR_DEPARTMENT_OTHER): Payer: Medicare Other | Admitting: Oncology

## 2021-04-21 ENCOUNTER — Inpatient Hospital Stay: Payer: Medicare Other | Attending: Oncology

## 2021-04-21 ENCOUNTER — Encounter: Payer: Self-pay | Admitting: Oncology

## 2021-04-21 ENCOUNTER — Inpatient Hospital Stay: Payer: Medicare Other

## 2021-04-21 ENCOUNTER — Inpatient Hospital Stay: Payer: Medicare Other | Admitting: Hospice and Palliative Medicine

## 2021-04-21 VITALS — BP 113/58 | HR 82 | Temp 97.3°F | Resp 18 | Wt 145.9 lb

## 2021-04-21 DIAGNOSIS — C3490 Malignant neoplasm of unspecified part of unspecified bronchus or lung: Secondary | ICD-10-CM

## 2021-04-21 DIAGNOSIS — Z5112 Encounter for antineoplastic immunotherapy: Secondary | ICD-10-CM

## 2021-04-21 DIAGNOSIS — Z8589 Personal history of malignant neoplasm of other organs and systems: Secondary | ICD-10-CM

## 2021-04-21 DIAGNOSIS — Z85830 Personal history of malignant neoplasm of bone: Secondary | ICD-10-CM | POA: Insufficient documentation

## 2021-04-21 DIAGNOSIS — E860 Dehydration: Secondary | ICD-10-CM

## 2021-04-21 DIAGNOSIS — M8580 Other specified disorders of bone density and structure, unspecified site: Secondary | ICD-10-CM | POA: Diagnosis not present

## 2021-04-21 DIAGNOSIS — K219 Gastro-esophageal reflux disease without esophagitis: Secondary | ICD-10-CM

## 2021-04-21 DIAGNOSIS — Z87891 Personal history of nicotine dependence: Secondary | ICD-10-CM | POA: Diagnosis not present

## 2021-04-21 LAB — CBC WITH DIFFERENTIAL/PLATELET
Abs Immature Granulocytes: 0.02 10*3/uL (ref 0.00–0.07)
Basophils Absolute: 0 10*3/uL (ref 0.0–0.1)
Basophils Relative: 1 %
Eosinophils Absolute: 0.4 10*3/uL (ref 0.0–0.5)
Eosinophils Relative: 8 %
HCT: 34.7 % — ABNORMAL LOW (ref 36.0–46.0)
Hemoglobin: 11.5 g/dL — ABNORMAL LOW (ref 12.0–15.0)
Immature Granulocytes: 0 %
Lymphocytes Relative: 29 %
Lymphs Abs: 1.6 10*3/uL (ref 0.7–4.0)
MCH: 29.9 pg (ref 26.0–34.0)
MCHC: 33.1 g/dL (ref 30.0–36.0)
MCV: 90.1 fL (ref 80.0–100.0)
Monocytes Absolute: 0.8 10*3/uL (ref 0.1–1.0)
Monocytes Relative: 13 %
Neutro Abs: 2.7 10*3/uL (ref 1.7–7.7)
Neutrophils Relative %: 49 %
Platelets: 173 10*3/uL (ref 150–400)
RBC: 3.85 MIL/uL — ABNORMAL LOW (ref 3.87–5.11)
RDW: 13 % (ref 11.5–15.5)
WBC: 5.6 10*3/uL (ref 4.0–10.5)
nRBC: 0 % (ref 0.0–0.2)

## 2021-04-21 LAB — COMPREHENSIVE METABOLIC PANEL
ALT: 13 U/L (ref 0–44)
AST: 22 U/L (ref 15–41)
Albumin: 4 g/dL (ref 3.5–5.0)
Alkaline Phosphatase: 41 U/L (ref 38–126)
Anion gap: 11 (ref 5–15)
BUN: 20 mg/dL (ref 8–23)
CO2: 24 mmol/L (ref 22–32)
Calcium: 9.4 mg/dL (ref 8.9–10.3)
Chloride: 100 mmol/L (ref 98–111)
Creatinine, Ser: 0.8 mg/dL (ref 0.44–1.00)
GFR, Estimated: >60 mL/min (ref >60)
Glucose, Bld: 112 mg/dL — ABNORMAL HIGH (ref 70–99)
Potassium: 4.1 mmol/L (ref 3.5–5.1)
Sodium: 135 mmol/L (ref 135–145)
Total Bilirubin: 0.5 mg/dL (ref 0.3–1.2)
Total Protein: 7.1 g/dL (ref 6.5–8.1)

## 2021-04-21 MED ORDER — HEPARIN SOD (PORK) LOCK FLUSH 100 UNIT/ML IV SOLN
500.0000 [IU] | Freq: Once | INTRAVENOUS | Status: AC | PRN
  Administered 2021-04-21: 500 [IU]
  Filled 2021-04-21: qty 5

## 2021-04-21 MED ORDER — DEXAMETHASONE SODIUM PHOSPHATE 10 MG/ML IJ SOLN
10.0000 mg | Freq: Once | INTRAMUSCULAR | Status: AC
  Administered 2021-04-21: 10 mg via INTRAVENOUS
  Filled 2021-04-21: qty 1

## 2021-04-21 MED ORDER — HEPARIN SOD (PORK) LOCK FLUSH 100 UNIT/ML IV SOLN
INTRAVENOUS | Status: AC
  Filled 2021-04-21: qty 5

## 2021-04-21 MED ORDER — ZOLEDRONIC ACID 4 MG/100ML IV SOLN
4.0000 mg | Freq: Once | INTRAVENOUS | Status: AC
  Administered 2021-04-21: 4 mg via INTRAVENOUS
  Filled 2021-04-21: qty 100

## 2021-04-21 MED ORDER — SODIUM CHLORIDE 0.9 % IV SOLN
1200.0000 mg | Freq: Once | INTRAVENOUS | Status: AC
  Administered 2021-04-21: 1200 mg via INTRAVENOUS
  Filled 2021-04-21: qty 20

## 2021-04-21 MED ORDER — SODIUM CHLORIDE 0.9 % IV SOLN
Freq: Once | INTRAVENOUS | Status: AC
  Filled 2021-04-21: qty 250

## 2021-04-21 NOTE — Progress Notes (Signed)
Pt here for follow up. No new concerns voiced.   

## 2021-04-21 NOTE — Patient Instructions (Signed)
[facility] ONCOLOGY    Discharge Instructions:  Thank you for choosing [facility] to provide your oncology and hematology care.  If you have a lab appointment with the [facility], please go directly to the [facility] and check in at the registration area.  Wear comfortable clothing and clothing appropriate for easy access to any Portacath or PICC line.   We strive to give you quality time with your provider. You may need to reschedule your appointment if you arrive late (15 or more minutes).  Arriving late affects you and other patients whose appointments are after yours.  Also, if you miss three or more appointments without notifying the office, you may be dismissed from the clinic at the provider's discretion.      For prescription refill requests, have your pharmacy contact our office and allow 72 hours for refills to be completed.    Today you received the following chemotherapy and/or immunotherapy agents: Atezolizumab (Tecentriq). Today you also received the following treatment: Zoledronic Acid (Zometa).      To help prevent nausea and vomiting after your treatment, we encourage you to take your nausea medication as directed.  BELOW ARE SYMPTOMS THAT SHOULD BE REPORTED IMMEDIATELY: . *FEVER GREATER THAN 100.4 F (38 C) OR HIGHER . *CHILLS OR SWEATING . *NAUSEA AND VOMITING THAT IS NOT CONTROLLED WITH YOUR NAUSEA MEDICATION . *UNUSUAL SHORTNESS OF BREATH . *UNUSUAL BRUISING OR BLEEDING . *URINARY PROBLEMS (pain or burning when urinating, or frequent urination) . *BOWEL PROBLEMS (unusual diarrhea, constipation, pain near the anus) . TENDERNESS IN MOUTH AND THROAT WITH OR WITHOUT PRESENCE OF ULCERS (sore throat, sores in mouth, or a toothache) . UNUSUAL RASH, SWELLING OR PAIN  . UNUSUAL VAGINAL DISCHARGE OR ITCHING   Items with * indicate a potential emergency and should be followed up as soon as possible or go to the Emergency  Department if any problems should occur.  Please show the CHEMOTHERAPY ALERT CARD or IMMUNOTHERAPY ALERT CARD at check-in to the Emergency Department and triage nurse.  Should you have questions after your visit or need to cancel or reschedule your appointment, please contact [facility]  [phone] and follow the prompts.  Office hours are 8:00 a.m. to 4:30 p.m. Monday - Friday. Please note that voicemails left after 4:00 p.m. may not be returned until the following business day.  We are closed weekends and major holidays. You have access to a nurse at all times for urgent questions. Please call the main number to the clinic [phone] and follow the prompts.  For any non-urgent questions, you may also contact your provider using MyChart. We now offer e-Visits for anyone 75 and older to request care online for non-urgent symptoms. For details visit [email or web address].   Also download the MyChart app! Go to the app store, search "MyChart", open the app, select [facility], and log in with your MyChart username and password.  Due to Covid, a mask is required upon entering the hospital/clinic. If you do not have a mask, one will be given to you upon arrival. For doctor visits, patients may have 1 support person aged 75 or older with them. For treatment visits, patients cannot have anyone with them due to current Covid guidelines and our immunocompromised population.   Zoledronic Acid Injection (Hypercalcemia, Oncology)  What is this medicine? ZOLEDRONIC ACID (ZOE le dron ik AS id) slows calcium loss from bones. It high calcium levels in the blood from  some kinds of cancer. It may be used in other people at risk for bone loss. This medicine may be used for other purposes; ask your health care provider or pharmacist if you have questions. COMMON BRAND NAME(S): Zometa What should I tell my health care provider before I take this medicine? They need to know  if you have any of these conditions:  cancer  dehydration  dental disease  kidney disease  liver disease  low levels of calcium in the blood  lung or breathing disease (asthma)  receiving steroids like dexamethasone or prednisone  an unusual or allergic reaction to zoledronic acid, other medicines, foods, dyes, or preservatives  pregnant or trying to get pregnant  breast-feeding How should I use this medicine? This drug is injected into a vein. It is given by a health care provider in a hospital or clinic setting. Talk to your health care provider about the use of this drug in children. Special care may be needed. Overdosage: If you think you have taken too much of this medicine contact a poison control center or emergency room at once. NOTE: This medicine is only for you. Do not share this medicine with others. What if I miss a dose? Keep appointments for follow-up doses. It is important not to miss your dose. Call your health care provider if you are unable to keep an appointment. What may interact with this medicine?  certain antibiotics given by injection  NSAIDs, medicines for pain and inflammation, like ibuprofen or naproxen  some diuretics like bumetanide, furosemide  teriparatide  thalidomide This list may not describe all possible interactions. Give your health care provider a list of all the medicines, herbs, non-prescription drugs, or dietary supplements you use. Also tell them if you smoke, drink alcohol, or use illegal drugs. Some items may interact with your medicine. What should I watch for while using this medicine? Visit your health care provider for regular checks on your progress. It may be some time before you see the benefit from this drug. Some people who take this drug have severe bone, joint, or muscle pain. This drug may also increase your risk for jaw problems or a broken thigh bone. Tell your health care provider right away if you have severe  pain in your jaw, bones, joints, or muscles. Tell you health care provider if you have any pain that does not go away or that gets worse. Tell your dentist and dental surgeon that you are taking this drug. You should not have major dental surgery while on this drug. See your dentist to have a dental exam and fix any dental problems before starting this drug. Take good care of your teeth while on this drug. Make sure you see your dentist for regular follow-up appointments. You should make sure you get enough calcium and vitamin D while you are taking this drug. Discuss the foods you eat and the vitamins you take with your health care provider. Check with your health care provider if you have severe diarrhea, nausea, and vomiting, or if you sweat a lot. The loss of too much body fluid may make it dangerous for you to take this drug. You may need blood work done while you are taking this drug. Do not become pregnant while taking this drug. Women should inform their health care provider if they wish to become pregnant or think they might be pregnant. There is potential for serious harm to an unborn child. Talk to your health care provider for  more information. What side effects may I notice from receiving this medicine? Side effects that you should report to your doctor or health care provider as soon as possible:  allergic reactions (skin rash, itching or hives; swelling of the face, lips, or tongue)  bone pain  infection (fever, chills, cough, sore throat, pain or trouble passing urine)  jaw pain, especially after dental work  joint pain  kidney injury (trouble passing urine or change in the amount of urine)  low blood pressure (dizziness; feeling faint or lightheaded, falls; unusually weak or tired)  low calcium levels (fast heartbeat; muscle cramps or pain; pain, tingling, or numbness in the hands or feet; seizures)  low magnesium levels (fast, irregular heartbeat; muscle cramp or pain;  muscle weakness; tremors; seizures)  low red blood cell counts (trouble breathing; feeling faint; lightheaded, falls; unusually weak or tired)  muscle pain  redness, blistering, peeling, or loosening of the skin, including inside the mouth  severe diarrhea  swelling of the ankles, feet, hands  trouble breathing Side effects that usually do not require medical attention (report to your doctor or health care provider if they continue or are bothersome):  anxious  constipation  coughing  depressed mood  eye irritation, itching, or pain  fever  general ill feeling or flu-like symptoms  nausea  pain, redness, or irritation at site where injected  trouble sleeping This list may not describe all possible side effects. Call your doctor for medical advice about side effects. You may report side effects to FDA at 1-800-FDA-1088. Where should I keep my medicine? This drug is given in a hospital or clinic. It will not be stored at home. NOTE: This sheet is a summary. It may not cover all possible information. If you have questions about this medicine, talk to your doctor, pharmacist, or health care provider.  2021 Elsevier/Gold Standard (2019-09-21 09:13:00)  Atezolizumab injection  What is this medicine? ATEZOLIZUMAB (a te zoe LIZ ue mab) is a monoclonal antibody. It is used to treat bladder cancer (urothelial cancer), liver cancer, lung cancer, and melanoma. This medicine may be used for other purposes; ask your health care provider or pharmacist if you have questions. COMMON BRAND NAME(S): Tecentriq What should I tell my health care provider before I take this medicine? They need to know if you have any of these conditions:  autoimmune diseases like Crohn's disease, ulcerative colitis, or lupus  have had or planning to have an allogeneic stem cell transplant (uses someone else's stem cells)  history of organ transplant  history of radiation to the chest  nervous  system problems like myasthenia gravis or Guillain-Barre syndrome  an unusual or allergic reaction to atezolizumab, other medicines, foods, dyes, or preservatives  pregnant or trying to get pregnant  breast-feeding How should I use this medicine? This medicine is for infusion into a vein. It is given by a health care professional in a hospital or clinic setting. A special MedGuide will be given to you before each treatment. Be sure to read this information carefully each time. Talk to your pediatrician regarding the use of this medicine in children. Special care may be needed. Overdosage: If you think you have taken too much of this medicine contact a poison control center or emergency room at once. NOTE: This medicine is only for you. Do not share this medicine with others. What if I miss a dose? It is important not to miss your dose. Call your doctor or health care professional if you  are unable to keep an appointment. What may interact with this medicine? Interactions have not been studied. This list may not describe all possible interactions. Give your health care provider a list of all the medicines, herbs, non-prescription drugs, or dietary supplements you use. Also tell them if you smoke, drink alcohol, or use illegal drugs. Some items may interact with your medicine. What should I watch for while using this medicine? Your condition will be monitored carefully while you are receiving this medicine. You may need blood work done while you are taking this medicine. Do not become pregnant while taking this medicine or for at least 5 months after stopping it. Women should inform their doctor if they wish to become pregnant or think they might be pregnant. There is a potential for serious side effects to an unborn child. Talk to your health care professional or pharmacist for more information. Do not breast-feed an infant while taking this medicine or for at least 5 months after the last  dose. What side effects may I notice from receiving this medicine? Side effects that you should report to your doctor or health care professional as soon as possible:  allergic reactions like skin rash, itching or hives, swelling of the face, lips, or tongue  black, tarry stools  bloody or watery diarrhea  breathing problems  changes in vision  chest pain or chest tightness  chills  facial flushing  fever  headache  signs and symptoms of high blood sugar such as dizziness; dry mouth; dry skin; fruity breath; nausea; stomach pain; increased hunger or thirst; increased urination  signs and symptoms of liver injury like dark yellow or brown urine; general ill feeling or flu-like symptoms; light-colored stools; loss of appetite; nausea; right upper belly pain; unusually weak or tired; yellowing of the eyes or skin  stomach pain  trouble passing urine or change in the amount of urine Side effects that usually do not require medical attention (report to your doctor or health care professional if they continue or are bothersome):  bone pain  cough  diarrhea  joint pain  muscle pain  muscle weakness  swelling of arms or legs  tiredness  weight loss This list may not describe all possible side effects. Call your doctor for medical advice about side effects. You may report side effects to FDA at 1-800-FDA-1088. Where should I keep my medicine? This drug is given in a hospital or clinic and will not be stored at home. NOTE: This sheet is a summary. It may not cover all possible information. If you have questions about this medicine, talk to your doctor, pharmacist, or health care provider.  2021 Elsevier/Gold Standard (2020-09-05 13:59:34)

## 2021-04-21 NOTE — Progress Notes (Signed)
Hematology/Oncology Follow up note [facility] Telephone:(336) [phone] Fax:(336) [phone]   Patient Care Team: Glean Hess, MD as PCP - General (Internal Medicine) Charolette Forward, MD as Consulting Physician (Cardiology) Telford Nab, RN as Registered Nurse [name], MD as Radiation Oncologist (Radiation Oncology)  REFERRING PROVIDER: Dr. Felicie Morn REASON FOR VISIT:  Follow-up for small cell lung cancer,   HISTORY OF PRESENTING ILLNESS:  Kristi [name] is a  75 y.o.  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  10/20/2018 CT chest with contrast showed large mediastinal mass involving both hilar, left greater than right, consistent with lung carcinoma, The mass causes narrowing of the left mainstem bronchus with resultant volume loss on the left and a mediastinal shift to the left.  Moderate size left pleural effusion. 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  # Initial MRI brain negative.  # Nov 2019- Jan 2020 s/p 4 cycles of Carbo/Etoposide/Tecentriq #  01/24/2019 interim CT scan done which showed continued positive response to therapy with continued reduction in mediastinal adenopathy.  No residual measurable left lung mass. CT findings of acute emphysematous cystitis. Urology did not feel that patient has pyelonephritis and recommend treatment with antibiotics because patient's immunocompromise. Patient finished treatment.   # 11/02/2018-01/09/2019 Chemotherapy carboplatin + Etoposide + tecentriq # Consolidation chest radiation and whole brain radiation finished in May 2020 # 04/25/2019 resume Tecentriq every 3 weeks. # Patient had a fall from her stairs on 01/19/2020. In the emergency room she had CT chest abdomen pelvis CT, CT head without contrast, CT cervical spine without contrast. No CT evidence for acute  intracranial abnormality, degenerative changes of cervical spine..  No CT evidence of acute thoracic abdomen injury.  Severe compression fracture of T7, subacute.  # kyphoplasty procedure on 03/01/2020.  T7 biopsy negative for cancer.  11/20/2020 Surveillance CT scan  Reduced size and prominence of right lateral lung base subpleural nodular density. No findings of active malignancy.  MRI brain showed stable post treatment brain. No metastatic disease.    INTERVAL HISTORY Kristi [name] is a 75 y.o. female who has above history reviewed by me today presents for evaluation prior to immunotherapy for treatment of extensive small cell lung cancer. Patient is on immunotherapy maintenance. Patient reports feeling well. She is here by herself today Denies any nausea vomiting diarrhea.    .  Review of Systems  Constitutional: Positive for fatigue. Negative for appetite change, chills and fever.  HENT:   Negative for hearing loss and voice change.   Eyes: Negative for eye problems.  Respiratory: Negative for chest tightness, cough and shortness of breath.   Cardiovascular: Negative for chest pain.  Gastrointestinal: Negative for abdominal distention, abdominal pain, blood in stool, diarrhea and nausea.  Endocrine: Negative for hot flashes.  Genitourinary: Negative for difficulty urinating and frequency.   Musculoskeletal: Negative for arthralgias and back pain.  Skin: Negative for itching and rash.  Neurological: Negative for extremity weakness and headaches.  Hematological: Negative for adenopathy.  Psychiatric/Behavioral: Negative for confusion and depression. The patient is not nervous/anxious.        Forgetful     MEDICAL HISTORY:  Past Medical History:  Diagnosis Date  . Acute MI, inferoposterior wall ([place]) 09/30/2014   1 stent  . Claustrophobia   . Coronary artery disease   . GERD (gastroesophageal reflux disease)   . Hypercholesteremia   .  MI, old   . Pneumonia   . Small  cell lung cancer ([place])   . 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 [facility]) 10/27/2018    SURGICAL HISTORY: Past Surgical History:  Procedure Laterality Date  . APPENDECTOMY     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  . BLADDER NECK SUSPENSION    . CHOLECYSTECTOMY N/A 08/14/2018   Procedure: LAPAROSCOPIC CHOLECYSTECTOMY;  Surgeon: Jules Husbands, MD;  Location: [facility] ORS;  Service: General;  Laterality: N/A;  . CHOLECYSTECTOMY  11/2018  . CORONARY ANGIOPLASTY  09/29/2014  . CORONARY STENT PLACEMENT    . ENDOBRONCHIAL ULTRASOUND N/A 10/21/2018   Procedure: ENDOBRONCHIAL ULTRASOUND;  Surgeon: Laverle Hobby, MD;  Location: [facility] ORS;  Service: Pulmonary;  Laterality: N/A;  . HERNIA REPAIR  2012  . IR FLUORO GUIDE CV LINE RIGHT  12/19/2018  . KYPHOPLASTY N/A 03/01/2020   Procedure: T7 KYPHOPLASTY;  Surgeon: Hessie Knows, MD;  Location: [facility] ORS;  Service: Orthopedics;  Laterality: N/A;  . LAPAROTOMY    . LEFT HEART CATHETERIZATION WITH CORONARY ANGIOGRAM N/A 09/29/2014   Procedure: LEFT HEART CATHETERIZATION WITH CORONARY ANGIOGRAM;  Surgeon: Clent Demark, MD;  Location: [facility] CATH LAB;  Service: Cardiovascular;  Laterality: N/A;  . PORTA CATH INSERTION N/A 01/02/2019   Procedure: PORTA CATH INSERTION;  Surgeon: Algernon Huxley, MD;  Location: [place] CV LAB;  Service: Cardiovascular;  Laterality: N/A;  . PORTACATH PLACEMENT Right 10/28/2018   Procedure: INSERTION PORT-A-CATH;  Surgeon: Jules Husbands, MD;  Location: [facility] ORS;  Service: General;  Laterality: Right;  . XI ROBOTIC ASSISTED VENTRAL HERNIA N/A 02/27/2021   Procedure: XI ROBOTIC ASSISTED VENTRAL HERNIA (incisional) WITH MESH;  Surgeon: Olean Ree, MD;  Location: [facility] ORS;  Service: General;  Laterality: N/A;    SOCIAL HISTORY: Social History   Socioeconomic History  . Marital status: Married    Spouse name: [name]   . Number of children: 3  . Years of education: Not on file  . Highest education level: Not on file  Occupational History   . Occupation: Retired    Comment: Chief Technology Officer   Tobacco Use  . Smoking status: Former Smoker    Packs/day: 1.00    Years: 39.00    Pack years: 39.00    Types: Cigarettes    Start date: 12/21/1978    Quit date: 10/16/2018    Years since quitting: 2.5  . Smokeless tobacco: Never Used  Vaping Use  . Vaping Use: Never used  Substance and Sexual Activity  . Alcohol use: Yes    Alcohol/week: 0.0 standard drinks    Comment: occassional - approx 1 every 2 weeks   . Drug use: No  . Sexual activity: Yes    Birth control/protection: None  Other Topics Concern  . Not on file  Social History Narrative  . Not on file   Social Determinants of Health   Financial Resource Strain: Not on file  Food Insecurity: Not on file  Transportation Needs: Not on file  Physical Activity: Not on file  Stress: Not on file  Social Connections: Not on file  Intimate Partner Violence: Not on file    FAMILY HISTORY: Family History  Problem Relation Age of Onset  . Alzheimer's disease Mother   . Colon cancer Father   . Breast cancer Neg Hx     ALLERGIES:  has No Known Allergies.  MEDICATIONS:  Current Outpatient Medications  Medication Sig Dispense Refill  . acetaminophen (TYLENOL) 500 MG tablet Take 2  tablets (1,000 mg total) by mouth every 6 (six) hours as needed for mild pain or headache.    . Calcium 600-200 MG-UNIT tablet Take 1 tablet by mouth 2 (two) times daily.    . Cholecalciferol (DIALYVITE VITAMIN D 5000) 125 MCG (5000 UT) capsule Take 5,000 Units by mouth daily.    [name] Kitchen lidocaine-prilocaine (EMLA) cream Apply 1 application topically daily as needed (port access). 30 g 2  . Magnesium 250 MG TABS Take 250 mg by mouth daily.    [name] Kitchen oxyCODONE (ROXICODONE) 5 MG immediate release tablet Take 1 tablet (5 mg total) by mouth every 6 (six) hours as needed for severe pain. 30 tablet 0  . pantoprazole (PROTONIX) 40 MG tablet Take 1 tablet (40 mg total) by mouth 2 (two) times daily. 60 tablet 0  .  prochlorperazine (COMPAZINE) 10 MG tablet Take 10 mg by mouth every 6 (six) hours as needed for nausea or vomiting.    . sertraline (ZOLOFT) 100 MG tablet Take 1 tablet (100 mg total) by mouth daily. 90 tablet 3  . sucralfate (CARAFATE) 1 g tablet Take 1 g by mouth 3 (three) times daily as needed (heartburn).    . traZODone (DESYREL) 50 MG tablet Take 1 tablet (50 mg total) by mouth at bedtime as needed for sleep. 30 tablet 2  . vitamin B-12 (CYANOCOBALAMIN) 1000 MCG tablet Take 1,000 mcg by mouth daily.     No current facility-administered medications for this visit.   Facility-Administered Medications Ordered in Other Visits  Medication Dose Route Frequency Provider Last Rate Last Admin  . heparin lock flush 100 unit/mL  500 Units Intravenous Once Earlie Server, MD      . heparin lock flush 100 unit/mL  500 Units Intravenous Once Earlie Server, MD      . sodium chloride flush (NS) 0.9 % injection 10 mL  10 mL Intravenous PRN Earlie Server, MD   10 mL at 01/09/19 0820  . sodium chloride flush (NS) 0.9 % injection 10 mL  10 mL Intravenous PRN Earlie Server, MD   10 mL at 01/10/19 1400     PHYSICAL EXAMINATION: ECOG PERFORMANCE STATUS: 1 - Symptomatic but completely ambulatory Vitals:   04/21/21 0905  BP: (!) 113/58  Pulse: 82  Resp: 18  Temp: (!) 97.3 F (36.3 C)  SpO2: 98%   Filed Weights   04/21/21 0905  Weight: 145 lb 14.4 oz (66.2 kg)   Physical Examination Today's Vitals   04/21/21 0905  BP: (!) 113/58  Pulse: 82  Resp: 18  Temp: (!) 97.3 F (36.3 C)  SpO2: 98%  Weight: 145 lb 14.4 oz (66.2 kg)  PainSc: 0-No pain   Body mass index is 22.85 kg/m.   Physical Exam Constitutional:      General: She is not in acute distress.    Appearance: She is not diaphoretic.     Comments: Patient sits in the wheelchair.  HENT:     Head: Normocephalic and atraumatic.     Nose: Nose normal.     Mouth/Throat:     Pharynx: No oropharyngeal exudate.  Eyes:     General: No scleral icterus.     Pupils: Pupils are equal, round, and reactive to light.  Cardiovascular:     Rate and Rhythm: Normal rate and regular rhythm.     Heart sounds: No murmur heard.   Pulmonary:     Effort: Pulmonary effort is normal. No respiratory distress.     Breath sounds: No wheezing  or rales.     Comments: Decreased breath sounds bilaterally.  Abdominal:     General: There is no distension.     Palpations: Abdomen is soft.     Tenderness: There is no abdominal tenderness.  Musculoskeletal:        General: Normal range of motion.     Cervical back: Normal range of motion and neck supple.  Skin:    General: Skin is warm and dry.     Findings: No erythema.  Neurological:     Mental Status: She is alert and oriented to person, place, and time.     Cranial Nerves: No cranial nerve deficit.     Motor: No abnormal muscle tone.     Coordination: Coordination normal.  Psychiatric:        Mood and Affect: Affect normal.        LABORATORY DATA:  I have reviewed the data as listed Lab Results  Component Value Date   WBC 5.6 04/21/2021   HGB 11.5 (L) 04/21/2021   HCT 34.7 (L) 04/21/2021   MCV 90.1 04/21/2021   PLT 173 04/21/2021   Recent Labs    08/12/20 1315 09/02/20 1358 09/23/20 0845 10/14/20 0839 03/10/21 0910 03/31/21 1003 04/21/21 0836  NA 134* 137 138   < > 136 134* 135  K 4.0 3.5 4.0   < > 3.9 3.8 4.1  CL 104 105 106   < > 101 100 100  CO2 23 24 24    < > 24 25 24   GLUCOSE 98 103* 91   < > 98 104* 112*  BUN 13 18 14    < > 11 16 20   CREATININE 0.81 0.68 0.75   < > 0.89 0.77 0.80  CALCIUM 8.8* 8.3* 8.6*   < > 9.8 10.0 9.4  GFRNONAA >60 >60 >60   < > >60 >60 >60  GFRAA >60 >60 >60  --   --   --   --   PROT 7.4 6.8 7.0   < > 7.2 6.9 7.1  ALBUMIN 4.0 3.7 3.8   < > 3.8 4.0 4.0  AST 17 14* 17   < > 20 19 22   ALT 12 11 11    < > 16 14 13   ALKPHOS 49 49 47   < > 47 44 41  BILITOT 0.5 0.5 0.6   < > 0.7 0.6 0.5   < > = values in this interval not displayed.    RADIOGRAPHIC  STUDIES: I have personally reviewed the radiological images as listed and agreed with the findings in the report.  CT CHEST ABDOMEN PELVIS W CONTRAST  Result Date: 02/26/2021 CLINICAL DATA:  Restaging small cell lung cancer. EXAM: CT CHEST, ABDOMEN, AND PELVIS WITH CONTRAST TECHNIQUE: Multidetector CT imaging of the chest, abdomen and pelvis was performed following the standard protocol during bolus administration of intravenous contrast. CONTRAST:  165mL OMNIPAQUE IOHEXOL 300 MG/ML  SOLN COMPARISON:  11/20/2020 FINDINGS: CT CHEST FINDINGS Cardiovascular: The heart is normal in size. No pericardial effusion. Stable tortuosity, ectasia and calcification of the thoracic aorta and stable branch vessel calcifications including extensive three-vessel coronary artery calcifications. Mediastinum/Nodes: No mediastinal or hilar mass or adenopathy. There is chronic soft tissue thickening around the mid esophagus likely related to treated tumor or possibly radiation affect. The esophagus is grossly normal. Lungs/Pleura: Chronic emphysematous changes and pulmonary scarring. No findings suspicious for recurrent lung cancer or pulmonary metastatic disease. Stable calcified granuloma in the right middle lobe. Musculoskeletal: Age  age stable vertebral augmentation changes at T7 and remote T4 fracture. Remote partially united sternal fracture. No acute bony findings or worrisome bone lesions. Moderate osteoporosis. CT ABDOMEN PELVIS FINDINGS Hepatobiliary: No findings suspicious for hepatic metastatic disease. The gallbladder is surgically absent. No common bile duct dilatation. Pancreas: Moderate pancreatic atrophy but no mass or inflammation. Spleen: Normal size. No focal lesions. Small accessory spleen noted. Adrenals/Urinary Tract: Adrenal glands are unremarkable and stable. No renal lesions or hydronephrosis. No bladder lesions or calculi. Mild trabeculation. Stomach/Bowel: The stomach, duodenum, small bowel and colon are  unremarkable. Vascular/Lymphatic: Advanced atherosclerotic calcifications involving the aorta and iliac arteries but no aneurysm or dissection. Extensive branch vessel calcifications are also noted. The major venous structures are patent. No mesenteric or retroperitoneal mass or adenopathy. Reproductive: The uterus and ovaries are unremarkable. Other: Stable appearing moderate-sized anterior abdominal wall hernia containing small bowel loops but no findings for incarceration or obstruction. Musculoskeletal: No acute bony findings or worrisome bone lesions. IMPRESSION: 1. Stable CT appearance of the chest, abdomen and pelvis. No findings suspicious for recurrent lung cancer or pulmonary metastatic disease. 2. Stable chronic soft tissue thickening around the mid esophagus likely related to treated tumor or possibly radiation effect. 3. Stable advanced atherosclerotic calcifications involving the thoracic and abdominal aorta and branch vessels including extensive three-vessel coronary artery calcifications. 4. Stable moderate-sized anterior abdominal wall hernia containing small bowel loops but no findings for incarceration or obstruction. 5. Emphysema and aortic atherosclerosis. Aortic Atherosclerosis (ICD10-I70.0) and Emphysema (ICD10-J43.9). Electronically Signed   By: Marijo Sanes M.D.   On: 02/26/2021 15:16     ASSESSMENT & PLAN:  1. Small cell lung cancer ([place])   2. Encounter for antineoplastic immunotherapy   3. Gastroesophageal reflux disease, unspecified whether esophagitis present   4. Osteopenia, unspecified location   5. 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   Currently on Tecentriq maintenance. Stable disease on recent CT scan.   Labs were reviewed and discussed with patient Proceed with Tecentriq maintenance. Obtain MRI brain with and without contrast for surveillance.  # History bone metastasis,  #Osteopenia  zometa every 4-6 weeks.  continue calcium and vitamin D.   Proceed with Zometa today.  #GERD, on omeprazole, symptom is not controlled.  Continue pantoprazole.  Symptoms are stable. Recommend patient to hold off ibuprofen. Take tylenol Q6 hours for pain if needed.  # COPD/emphysema, Stable. Monitor.  Follow up in 3 weeks for next cycle of Tecentriq.  Earlie Server, MD, PhD

## 2021-04-24 ENCOUNTER — Other Ambulatory Visit: Payer: Self-pay | Admitting: Oncology

## 2021-05-12 ENCOUNTER — Inpatient Hospital Stay (HOSPITAL_BASED_OUTPATIENT_CLINIC_OR_DEPARTMENT_OTHER): Payer: Medicare Other | Admitting: Oncology

## 2021-05-12 ENCOUNTER — Inpatient Hospital Stay: Payer: Medicare Other

## 2021-05-12 ENCOUNTER — Encounter: Payer: Self-pay | Admitting: Oncology

## 2021-05-12 ENCOUNTER — Inpatient Hospital Stay (HOSPITAL_BASED_OUTPATIENT_CLINIC_OR_DEPARTMENT_OTHER): Payer: Medicare Other | Admitting: Hospice and Palliative Medicine

## 2021-05-12 VITALS — BP 108/73 | HR 80 | Temp 98.0°F | Resp 18 | Wt 145.8 lb

## 2021-05-12 DIAGNOSIS — Z87891 Personal history of nicotine dependence: Secondary | ICD-10-CM | POA: Diagnosis not present

## 2021-05-12 DIAGNOSIS — M8580 Other specified disorders of bone density and structure, unspecified site: Secondary | ICD-10-CM | POA: Diagnosis not present

## 2021-05-12 DIAGNOSIS — C3490 Malignant neoplasm of unspecified part of unspecified bronchus or lung: Secondary | ICD-10-CM | POA: Diagnosis not present

## 2021-05-12 DIAGNOSIS — Z85830 Personal history of malignant neoplasm of bone: Secondary | ICD-10-CM | POA: Diagnosis not present

## 2021-05-12 DIAGNOSIS — Z515 Encounter for palliative care: Secondary | ICD-10-CM

## 2021-05-12 DIAGNOSIS — Z5112 Encounter for antineoplastic immunotherapy: Secondary | ICD-10-CM | POA: Diagnosis not present

## 2021-05-12 DIAGNOSIS — Z8589 Personal history of malignant neoplasm of other organs and systems: Secondary | ICD-10-CM | POA: Diagnosis not present

## 2021-05-12 LAB — CBC WITH DIFFERENTIAL/PLATELET
Abs Immature Granulocytes: 0.04 10*3/uL (ref 0.00–0.07)
Basophils Absolute: 0.1 10*3/uL (ref 0.0–0.1)
Basophils Relative: 1 %
Eosinophils Absolute: 0.4 10*3/uL (ref 0.0–0.5)
Eosinophils Relative: 6 %
HCT: 35.4 % — ABNORMAL LOW (ref 36.0–46.0)
Hemoglobin: 11.8 g/dL — ABNORMAL LOW (ref 12.0–15.0)
Immature Granulocytes: 1 %
Lymphocytes Relative: 25 %
Lymphs Abs: 1.5 10*3/uL (ref 0.7–4.0)
MCH: 30.1 pg (ref 26.0–34.0)
MCHC: 33.3 g/dL (ref 30.0–36.0)
MCV: 90.3 fL (ref 80.0–100.0)
Monocytes Absolute: 0.9 10*3/uL (ref 0.1–1.0)
Monocytes Relative: 14 %
Neutro Abs: 3.4 10*3/uL (ref 1.7–7.7)
Neutrophils Relative %: 53 %
Platelets: 173 10*3/uL (ref 150–400)
RBC: 3.92 MIL/uL (ref 3.87–5.11)
RDW: 13.2 % (ref 11.5–15.5)
WBC: 6.2 10*3/uL (ref 4.0–10.5)
nRBC: 0 % (ref 0.0–0.2)

## 2021-05-12 LAB — COMPREHENSIVE METABOLIC PANEL
ALT: 12 U/L (ref 0–44)
AST: 16 U/L (ref 15–41)
Albumin: 4.1 g/dL (ref 3.5–5.0)
Alkaline Phosphatase: 42 U/L (ref 38–126)
Anion gap: 10 (ref 5–15)
BUN: 13 mg/dL (ref 8–23)
CO2: 25 mmol/L (ref 22–32)
Calcium: 9.5 mg/dL (ref 8.9–10.3)
Chloride: 103 mmol/L (ref 98–111)
Creatinine, Ser: 0.78 mg/dL (ref 0.44–1.00)
GFR, Estimated: >60 mL/min (ref >60)
Glucose, Bld: 100 mg/dL — ABNORMAL HIGH (ref 70–99)
Potassium: 3.8 mmol/L (ref 3.5–5.1)
Sodium: 138 mmol/L (ref 135–145)
Total Bilirubin: 0.5 mg/dL (ref 0.3–1.2)
Total Protein: 6.9 g/dL (ref 6.5–8.1)

## 2021-05-12 MED ORDER — SODIUM CHLORIDE 0.9% FLUSH
10.0000 mL | Freq: Once | INTRAVENOUS | Status: AC
  Administered 2021-05-12: 10 mL via INTRAVENOUS
  Filled 2021-05-12: qty 10

## 2021-05-12 MED ORDER — SODIUM CHLORIDE 0.9% FLUSH
10.0000 mL | INTRAVENOUS | Status: DC | PRN
  Filled 2021-05-12: qty 10

## 2021-05-12 MED ORDER — SODIUM CHLORIDE 0.9 % IV SOLN
Freq: Once | INTRAVENOUS | Status: AC
Start: 2021-05-12 — End: 2021-05-12
  Filled 2021-05-12: qty 250

## 2021-05-12 MED ORDER — SODIUM CHLORIDE 0.9 % IV SOLN
1200.0000 mg | Freq: Once | INTRAVENOUS | Status: AC
  Administered 2021-05-12: 1200 mg via INTRAVENOUS
  Filled 2021-05-12: qty 20

## 2021-05-12 MED ORDER — HEPARIN SOD (PORK) LOCK FLUSH 100 UNIT/ML IV SOLN
500.0000 [IU] | Freq: Once | INTRAVENOUS | Status: AC | PRN
  Administered 2021-05-12: 500 [IU]
  Filled 2021-05-12: qty 5

## 2021-05-12 MED ORDER — DEXAMETHASONE SODIUM PHOSPHATE 10 MG/ML IJ SOLN
10.0000 mg | Freq: Once | INTRAMUSCULAR | Status: AC
  Administered 2021-05-12: 10 mg via INTRAVENOUS
  Filled 2021-05-12: qty 1

## 2021-05-12 NOTE — Progress Notes (Signed)
[place]  [phone] [phone]   Name: Kristi [name] [facility] Date: 05/12/2021 MRN: [id number]  DOB: 12-09-1946  Patient Care Team: Glean Hess, MD as PCP - General (Internal Medicine) Charolette Forward, MD as Consulting Physician (Cardiology) Telford Nab, RN as Registered Nurse [name], MD as Radiation Oncologist (Radiation Oncology)    REASON FOR CONSULTATION: Mrs. Shailyn Weyandt is a 75 y.o. 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  PMH also notable for CAD with history of acute MI, claustrophobia, hypertension, and hyperlipidemia.    Patient status post chemo on maintenance immunotherapy.  Palliative care was asked to help address goals and symptoms.  SOCIAL HISTORY:    Patient is married and lives at home with her husband of 39 years.  They moved here from [place], [place] around 4 years ago.  Patient has 2 sons and a daughter.  One son lives in [place] and the other 2 children are in [place].  Patient used to work for a [employer] and then later for the local [employer].  ADVANCE DIRECTIVES:  Not on file  CODE STATUS: DNR (MOST form completed on 11/24/18)  PAST MEDICAL HISTORY: Past Medical History:  Diagnosis Date  . Acute MI, inferoposterior wall ([place]) 09/30/2014   1 stent  . Claustrophobia   . Coronary artery disease   . GERD (gastroesophageal reflux disease)   . Hypercholesteremia   . MI, old   . Pneumonia   . Small cell lung cancer ([place])   . 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 [facility]) 10/27/2018    PAST SURGICAL HISTORY:  Past Surgical History:  Procedure Laterality Date  . APPENDECTOMY     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  . BLADDER NECK SUSPENSION    . CHOLECYSTECTOMY N/A 08/14/2018   Procedure: LAPAROSCOPIC CHOLECYSTECTOMY;  Surgeon: Jules Husbands, MD;  Location: [facility] ORS;   Service: General;  Laterality: N/A;  . CHOLECYSTECTOMY  11/2018  . CORONARY ANGIOPLASTY  09/29/2014  . CORONARY STENT PLACEMENT    . ENDOBRONCHIAL ULTRASOUND N/A 10/21/2018   Procedure: ENDOBRONCHIAL ULTRASOUND;  Surgeon: Laverle Hobby, MD;  Location: [facility] ORS;  Service: Pulmonary;  Laterality: N/A;  . HERNIA REPAIR  2012  . IR FLUORO GUIDE CV LINE RIGHT  12/19/2018  . KYPHOPLASTY N/A 03/01/2020   Procedure: T7 KYPHOPLASTY;  Surgeon: Hessie Knows, MD;  Location: [facility] ORS;  Service: Orthopedics;  Laterality: N/A;  . LAPAROTOMY    . LEFT HEART CATHETERIZATION WITH CORONARY ANGIOGRAM N/A 09/29/2014   Procedure: LEFT HEART CATHETERIZATION WITH CORONARY ANGIOGRAM;  Surgeon: Clent Demark, MD;  Location: [facility] CATH LAB;  Service: Cardiovascular;  Laterality: N/A;  . PORTA CATH INSERTION N/A 01/02/2019   Procedure: PORTA CATH INSERTION;  Surgeon: Algernon Huxley, MD;  Location: [facility] CV LAB;  Service: Cardiovascular;  Laterality: N/A;  . PORTACATH PLACEMENT Right 10/28/2018   Procedure: INSERTION PORT-A-CATH;  Surgeon: Jules Husbands, MD;  Location: [facility] ORS;  Service: General;  Laterality: Right;  . XI ROBOTIC ASSISTED VENTRAL HERNIA N/A 02/27/2021   Procedure: XI ROBOTIC ASSISTED VENTRAL HERNIA (incisional) WITH MESH;  Surgeon: Olean Ree, MD;  Location: [facility] ORS;  Service: General;  Laterality: N/A;    HEMATOLOGY/ONCOLOGY HISTORY:  Oncology History Overview Note  JALEY YAN is a  75 y.o.  female with PMH listed below who was referred to me for evaluation of  small cell lung cancer.   10/20/2018 CT chest with contrast showed large mediastinal mass involving both hilar, left greater than right, consistent with lung carcinoma, The mass causes narrowing of the left mainstem bronchus with resultant volume loss on the left and a mediastinal shift to the left.  Moderate size left pleural effusion. Patient underwent E bus bronchoscopy 10/21/2018 Left mainstem bronchus transbronchial  forcep biopsy showed small cell carcinoma.   # Nov 2019- Jan 2020 s/p 4 cycles of Carbo/Etoposide/Tecentriq #  01/24/2019 interim CT scan done which showed continued positive response to therapy with continued reduction in mediastinal adenopathy.  No residual measurable left lung mass. CT findings of acute emphysematous cystitis. Urology did not feel that patient has pyelonephritis and recommend treatment with antibiotics because patient's immunocompromise. Patient finished treatment.    # Chest and whole brain radiation finished in May 2020 # 04/25/2019 resume Tecentriq every 3 weeks.   Small cell lung cancer ([place])  10/27/2018 Initial Diagnosis   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 [facility])   11/02/2018 -  Chemotherapy    Patient is on Treatment Plan: LUNG SCLC CARBOPLATIN + ETOPOSIDE + ATEZOLIZUMAB INDUCTION Q21D / ATEZOLIZUMAB MAINTENANCE Q21D        ALLERGIES:  has No Known Allergies.  MEDICATIONS:  Current Outpatient Medications  Medication Sig Dispense Refill  . acetaminophen (TYLENOL) 500 MG tablet Take 2 tablets (1,000 mg total) by mouth every 6 (six) hours as needed for mild pain or headache.    . Calcium 600-200 MG-UNIT tablet Take 1 tablet by mouth 2 (two) times daily.    . Cholecalciferol (DIALYVITE VITAMIN D 5000) 125 MCG (5000 UT) capsule Take 5,000 Units by mouth daily.    [name] Kitchen lidocaine-prilocaine (EMLA) cream Apply 1 application topically daily as needed (port access). 30 g 2  . Magnesium 250 MG TABS Take 250 mg by mouth daily.    [name] Kitchen oxyCODONE (ROXICODONE) 5 MG immediate release tablet Take 1 tablet (5 mg total) by mouth every 6 (six) hours as needed for severe pain. 30 tablet 0  . pantoprazole (PROTONIX) 40 MG tablet Take 1 tablet (40 mg total) by mouth 2 (two) times daily. 60 tablet 0  . prochlorperazine (COMPAZINE) 10 MG tablet Take 10 mg by mouth every 6 (six) hours as needed for nausea or vomiting.    . sertraline (ZOLOFT) 100 MG tablet Take 1 tablet (100 mg total) by mouth  daily. 90 tablet 3  . sucralfate (CARAFATE) 1 g tablet Take 1 g by mouth 3 (three) times daily as needed (heartburn).    . traZODone (DESYREL) 50 MG tablet Take 1 tablet (50 mg total) by mouth at bedtime as needed for sleep. 30 tablet 2  . vitamin B-12 (CYANOCOBALAMIN) 1000 MCG tablet Take 1,000 mcg by mouth daily.     No current facility-administered medications for this visit.   Facility-Administered Medications Ordered in Other Visits  Medication Dose Route Frequency Provider Last Rate Last Admin  . 0.9 %  sodium chloride infusion   Intravenous Once Earlie Server, MD      . [facility]) 1,200 mg in sodium chloride 0.9 % 250 mL chemo infusion  1,200 mg Intravenous Once Earlie Server, MD      . dexamethasone (DECADRON) injection 10 mg  10 mg Intravenous Once Earlie Server, MD      . heparin lock flush 100 unit/mL  500 Units Intravenous Once Earlie Server, MD      . heparin lock flush 100 unit/mL  500 Units  Intravenous Once Earlie Server, MD      . heparin lock flush 100 unit/mL  500 Units Intracatheter Once PRN Earlie Server, MD      . sodium chloride flush (NS) 0.9 % injection 10 mL  10 mL Intravenous PRN Earlie Server, MD   10 mL at 01/09/19 0820  . sodium chloride flush (NS) 0.9 % injection 10 mL  10 mL Intravenous PRN Earlie Server, MD   10 mL at 01/10/19 1400  . sodium chloride flush (NS) 0.9 % injection 10 mL  10 mL Intracatheter PRN Earlie Server, MD        VITAL SIGNS: There were no vitals taken for this visit. There were no vitals filed for this visit.  Estimated body mass index is 22.84 kg/m as calculated from the following:   Height as of 04/11/21: 5\' 7"  (1.702 m).   Weight as of an earlier encounter on 05/12/21: 145 lb 12.8 oz (66.1 kg).  LABS: CBC:    Component Value Date/Time   WBC 6.2 05/12/2021 0834   HGB 11.8 (L) 05/12/2021 0834   HGB 14.5 09/19/2018 1038   HCT 35.4 (L) 05/12/2021 0834   HCT 42.7 09/19/2018 1038   PLT 173 05/12/2021 0834   PLT 265 09/19/2018 1038   MCV 90.3 05/12/2021 0834    MCV 91 09/19/2018 1038   NEUTROABS 3.4 05/12/2021 0834   NEUTROABS 5.1 09/19/2018 1038   LYMPHSABS 1.5 05/12/2021 0834   LYMPHSABS 2.0 09/19/2018 1038   MONOABS 0.9 05/12/2021 0834   EOSABS 0.4 05/12/2021 0834   EOSABS 0.3 09/19/2018 1038   BASOSABS 0.1 05/12/2021 0834   BASOSABS 0.1 09/19/2018 1038   Comprehensive Metabolic Panel:    Component Value Date/Time   NA 138 05/12/2021 0834   NA 136 09/19/2018 1038   K 3.8 05/12/2021 0834   CL 103 05/12/2021 0834   CO2 25 05/12/2021 0834   BUN 13 05/12/2021 0834   BUN 6 (L) 09/19/2018 1038   CREATININE 0.78 05/12/2021 0834   GLUCOSE 100 (H) 05/12/2021 0834   CALCIUM 9.5 05/12/2021 0834   AST 16 05/12/2021 0834   ALT 12 05/12/2021 0834   ALKPHOS 42 05/12/2021 0834   BILITOT 0.5 05/12/2021 0834   BILITOT 0.5 09/19/2018 1038   PROT 6.9 05/12/2021 0834   PROT 7.4 09/19/2018 1038   ALBUMIN 4.1 05/12/2021 0834   ALBUMIN 4.3 09/19/2018 1038    RADIOGRAPHIC STUDIES: No results found.  PERFORMANCE STATUS (ECOG) : 2-3  Review of Systems Unless otherwise noted, a complete review of systems is negative.  Physical Exam General: NAD, frail appearing, thin Pulmonary: Unlabored Extremities: no edema, no joint deformities Skin: no rashes Neurological: Weakness but otherwise nonfocal  IMPRESSION: Patient reports doing better after increasing dose of sertraline.  She reports fewer days of depression and irritability.  Overall, she feels moods are improved.  She also says she is sleeping better on trazodone.  Patient is planning a 2-week trip back to [place] in a few weeks and is looking forward to that.  She endorses some oral pain under her dentures.  We will start Magic mouthwash.  Also discussed use of baking soda/salt rinses.  PLAN: -Continue current scope of treatment -Continue sertraline 100 mg daily -Continue trazodone 50 mg nightly as needed for sleep -Magic mouthwash prn -RTC in 1 month   Patient expressed understanding  and was in agreement with this plan. She also understands that She can call the clinic at any time with any questions, concerns,  or complaints.     Time Total: 25 minutes  Visit consisted of counseling and education dealing with the complex and emotionally intense issues of symptom management and palliative care in the setting of serious and potentially life-threatening illness.Greater than 50%  of this time was spent counseling and coordinating care related to the above assessment and plan.  Signed by: Altha Harm, PhD, NP-C [phone] (Work Cell)

## 2021-05-12 NOTE — Progress Notes (Signed)
Hematology/Oncology Follow up note [facility] [phone]) [phone] [phone]) [phone]   Patient Care Team: Glean Hess, MD as PCP - General (Internal Medicine) Charolette Forward, MD as Consulting Physician (Cardiology) Telford Nab, RN as Registered Nurse [name], MD as Radiation Oncologist (Radiation Oncology)   REASON FOR VISIT:  Follow-up for small cell lung cancer,   HISTORY OF PRESENTING ILLNESS:  Kristi [name] is a  75 y.o.  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  10/20/2018 CT chest with contrast showed large mediastinal mass involving both hilar, left greater than right, consistent with lung carcinoma, The mass causes narrowing of the left mainstem bronchus with resultant volume loss on the left and a mediastinal shift to the left.  Moderate size left pleural effusion. 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  # Initial MRI brain negative.  # Nov 2019- Jan 2020 s/p 4 cycles of Carbo/Etoposide/Tecentriq #  01/24/2019 interim CT scan done which showed continued positive response to therapy with continued reduction in mediastinal adenopathy.  No residual measurable left lung mass. CT findings of acute emphysematous cystitis. Urology did not feel that patient has pyelonephritis and recommend treatment with antibiotics because patient's immunocompromise. Patient finished treatment.   # 11/02/2018-01/09/2019 Chemotherapy carboplatin + Etoposide + tecentriq # Consolidation chest radiation and whole brain radiation finished in May 2020 # 04/25/2019 resume Tecentriq every 3 weeks. # Patient had a fall from her stairs on 01/19/2020. In the emergency room she had CT chest abdomen pelvis CT, CT head without contrast, CT cervical spine without contrast. No CT evidence for acute intracranial abnormality, degenerative  changes of cervical spine..  No CT evidence of acute thoracic abdomen injury.  Severe compression fracture of T7, subacute.  # kyphoplasty procedure on 03/01/2020.  T7 biopsy negative for cancer.  11/20/2020 Surveillance CT scan  Reduced size and prominence of right lateral lung base subpleural nodular density. No findings of active malignancy.  MRI brain showed stable post treatment brain. No metastatic disease.    INTERVAL HISTORY Kristi [name] is a 75 y.o. female who has above history reviewed by me today presents for evaluation prior to immunotherapy for treatment of extensive small cell lung cancer. Patient is on immunotherapy maintenance. Patient reports feeling well. Patient denies any new symptoms. .  Review of Systems  Constitutional: Positive for fatigue. Negative for appetite change, chills and fever.  HENT:   Negative for hearing loss and voice change.   Eyes: Negative for eye problems.  Respiratory: Negative for chest tightness, cough and shortness of breath.   Cardiovascular: Negative for chest pain.  Gastrointestinal: Negative for abdominal distention, abdominal pain, blood in stool, diarrhea and nausea.  Endocrine: Negative for hot flashes.  Genitourinary: Negative for difficulty urinating and frequency.   Musculoskeletal: Negative for arthralgias and back pain.  Skin: Negative for itching and rash.  Neurological: Negative for extremity weakness and headaches.  Hematological: Negative for adenopathy.  Psychiatric/Behavioral: Negative for confusion and depression. The patient is not nervous/anxious.        Forgetful     MEDICAL HISTORY:  Past Medical History:  Diagnosis Date  . Acute MI, inferoposterior wall ([place]) 09/30/2014   1 stent  . Claustrophobia   . Coronary artery disease   . GERD (gastroesophageal reflux disease)   . Hypercholesteremia   . MI, old   . Pneumonia   . Small cell  lung cancer ([place])   . 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 [facility]) 10/27/2018     SURGICAL HISTORY: Past Surgical History:  Procedure Laterality Date  . APPENDECTOMY     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  . BLADDER NECK SUSPENSION    . CHOLECYSTECTOMY N/A 08/14/2018   Procedure: LAPAROSCOPIC CHOLECYSTECTOMY;  Surgeon: Jules Husbands, MD;  Location: [facility] ORS;  Service: General;  Laterality: N/A;  . CHOLECYSTECTOMY  11/2018  . CORONARY ANGIOPLASTY  09/29/2014  . CORONARY STENT PLACEMENT    . ENDOBRONCHIAL ULTRASOUND N/A 10/21/2018   Procedure: ENDOBRONCHIAL ULTRASOUND;  Surgeon: Laverle Hobby, MD;  Location: [facility] ORS;  Service: Pulmonary;  Laterality: N/A;  . HERNIA REPAIR  2012  . IR FLUORO GUIDE CV LINE RIGHT  12/19/2018  . KYPHOPLASTY N/A 03/01/2020   Procedure: T7 KYPHOPLASTY;  Surgeon: Hessie Knows, MD;  Location: [facility] ORS;  Service: Orthopedics;  Laterality: N/A;  . LAPAROTOMY    . LEFT HEART CATHETERIZATION WITH CORONARY ANGIOGRAM N/A 09/29/2014   Procedure: LEFT HEART CATHETERIZATION WITH CORONARY ANGIOGRAM;  Surgeon: Clent Demark, MD;  Location: [facility] CATH LAB;  Service: Cardiovascular;  Laterality: N/A;  . PORTA CATH INSERTION N/A 01/02/2019   Procedure: PORTA CATH INSERTION;  Surgeon: Algernon Huxley, MD;  Location: [place] CV LAB;  Service: Cardiovascular;  Laterality: N/A;  . PORTACATH PLACEMENT Right 10/28/2018   Procedure: INSERTION PORT-A-CATH;  Surgeon: Jules Husbands, MD;  Location: [facility] ORS;  Service: General;  Laterality: Right;  . XI ROBOTIC ASSISTED VENTRAL HERNIA N/A 02/27/2021   Procedure: XI ROBOTIC ASSISTED VENTRAL HERNIA (incisional) WITH MESH;  Surgeon: Olean Ree, MD;  Location: [facility] ORS;  Service: General;  Laterality: N/A;    SOCIAL HISTORY: Social History   Socioeconomic History  . Marital status: Married    Spouse name: [name]   . Number of children: 3  . Years of education: Not on file  . Highest education level: Not on file  Occupational History  . Occupation: Retired    Comment: Chief Technology Officer   Tobacco Use  .  Smoking status: Former Smoker    Packs/day: 1.00    Years: 39.00    Pack years: 39.00    Types: Cigarettes    Start date: 12/21/1978    Quit date: 10/16/2018    Years since quitting: 2.5  . Smokeless tobacco: Never Used  Vaping Use  . Vaping Use: Never used  Substance and Sexual Activity  . Alcohol use: Yes    Alcohol/week: 0.0 standard drinks    Comment: occassional - approx 1 every 2 weeks   . Drug use: No  . Sexual activity: Yes    Birth control/protection: None  Other Topics Concern  . Not on file  Social History Narrative  . Not on file   Social Determinants of Health   Financial Resource Strain: Not on file  Food Insecurity: Not on file  Transportation Needs: Not on file  Physical Activity: Not on file  Stress: Not on file  Social Connections: Not on file  Intimate Partner Violence: Not on file    FAMILY HISTORY: Family History  Problem Relation Age of Onset  . Alzheimer's disease Mother   . Colon cancer Father   . Breast cancer Neg Hx     ALLERGIES:  has No Known Allergies.  MEDICATIONS:  Current Outpatient Medications  Medication Sig Dispense Refill  . acetaminophen (TYLENOL) 500 MG tablet Take 2 tablets (1,000 mg total) by mouth every 6 (six) hours as  needed for mild pain or headache.    . Calcium 600-200 MG-UNIT tablet Take 1 tablet by mouth 2 (two) times daily.    . Cholecalciferol (DIALYVITE VITAMIN D 5000) 125 MCG (5000 UT) capsule Take 5,000 Units by mouth daily.    [name] Kitchen lidocaine-prilocaine (EMLA) cream Apply 1 application topically daily as needed (port access). 30 g 2  . Magnesium 250 MG TABS Take 250 mg by mouth daily.    [name] Kitchen oxyCODONE (ROXICODONE) 5 MG immediate release tablet Take 1 tablet (5 mg total) by mouth every 6 (six) hours as needed for severe pain. 30 tablet 0  . pantoprazole (PROTONIX) 40 MG tablet Take 1 tablet (40 mg total) by mouth 2 (two) times daily. 60 tablet 0  . prochlorperazine (COMPAZINE) 10 MG tablet Take 10 mg by mouth every 6  (six) hours as needed for nausea or vomiting.    . sertraline (ZOLOFT) 100 MG tablet Take 1 tablet (100 mg total) by mouth daily. 90 tablet 3  . sucralfate (CARAFATE) 1 g tablet Take 1 g by mouth 3 (three) times daily as needed (heartburn).    . traZODone (DESYREL) 50 MG tablet Take 1 tablet (50 mg total) by mouth at bedtime as needed for sleep. 30 tablet 2  . vitamin B-12 (CYANOCOBALAMIN) 1000 MCG tablet Take 1,000 mcg by mouth daily.     No current facility-administered medications for this visit.   Facility-Administered Medications Ordered in Other Visits  Medication Dose Route Frequency Provider Last Rate Last Admin  . heparin lock flush 100 unit/mL  500 Units Intravenous Once Earlie Server, MD      . heparin lock flush 100 unit/mL  500 Units Intravenous Once Earlie Server, MD      . sodium chloride flush (NS) 0.9 % injection 10 mL  10 mL Intravenous PRN Earlie Server, MD   10 mL at 01/09/19 0820  . sodium chloride flush (NS) 0.9 % injection 10 mL  10 mL Intravenous PRN Earlie Server, MD   10 mL at 01/10/19 1400  . sodium chloride flush (NS) 0.9 % injection 10 mL  10 mL Intravenous Once Earlie Server, MD         PHYSICAL EXAMINATION: ECOG PERFORMANCE STATUS: 1 - Symptomatic but completely ambulatory There were no vitals filed for this visit. There were no vitals filed for this visit. Physical Examination There were no vitals filed for this visit. There is no height or weight on file to calculate BMI.   Physical Exam Constitutional:      General: She is not in acute distress.    Appearance: She is not diaphoretic.     Comments: Patient sits in the wheelchair.  HENT:     Head: Normocephalic and atraumatic.     Nose: Nose normal.     Mouth/Throat:     Pharynx: No oropharyngeal exudate.  Eyes:     General: No scleral icterus.    Pupils: Pupils are equal, round, and reactive to light.  Cardiovascular:     Rate and Rhythm: Normal rate and regular rhythm.     Heart sounds: No murmur  heard.   Pulmonary:     Effort: Pulmonary effort is normal. No respiratory distress.     Breath sounds: No wheezing or rales.     Comments: Decreased breath sounds bilaterally.  Abdominal:     General: There is no distension.     Palpations: Abdomen is soft.     Tenderness: There is no abdominal tenderness.  Musculoskeletal:  General: Normal range of motion.     Cervical back: Normal range of motion and neck supple.  Skin:    General: Skin is warm and dry.     Findings: No erythema.  Neurological:     Mental Status: She is alert and oriented to person, place, and time.     Cranial Nerves: No cranial nerve deficit.     Motor: No abnormal muscle tone.     Coordination: Coordination normal.  Psychiatric:        Mood and Affect: Affect normal.        LABORATORY DATA:  I have reviewed the data as listed Lab Results  Component Value Date   WBC 5.6 04/21/2021   HGB 11.5 (L) 04/21/2021   HCT 34.7 (L) 04/21/2021   MCV 90.1 04/21/2021   PLT 173 04/21/2021   Recent Labs    08/12/20 1315 09/02/20 1358 09/23/20 0845 10/14/20 0839 03/10/21 0910 03/31/21 1003 04/21/21 0836  NA 134* 137 138   < > 136 134* 135  K 4.0 3.5 4.0   < > 3.9 3.8 4.1  CL 104 105 106   < > 101 100 100  CO2 23 24 24    < > 24 25 24   GLUCOSE 98 103* 91   < > 98 104* 112*  BUN 13 18 14    < > 11 16 20   CREATININE 0.81 0.68 0.75   < > 0.89 0.77 0.80  CALCIUM 8.8* 8.3* 8.6*   < > 9.8 10.0 9.4  GFRNONAA >60 >60 >60   < > >60 >60 >60  GFRAA >60 >60 >60  --   --   --   --   PROT 7.4 6.8 7.0   < > 7.2 6.9 7.1  ALBUMIN 4.0 3.7 3.8   < > 3.8 4.0 4.0  AST 17 14* 17   < > 20 19 22   ALT 12 11 11    < > 16 14 13   ALKPHOS 49 49 47   < > 47 44 41  BILITOT 0.5 0.5 0.6   < > 0.7 0.6 0.5   < > = values in this interval not displayed.    RADIOGRAPHIC STUDIES: I have personally reviewed the radiological images as listed and agreed with the findings in the report.  CT CHEST ABDOMEN PELVIS W CONTRAST  Result  Date: 02/26/2021 CLINICAL DATA:  Restaging small cell lung cancer. EXAM: CT CHEST, ABDOMEN, AND PELVIS WITH CONTRAST TECHNIQUE: Multidetector CT imaging of the chest, abdomen and pelvis was performed following the standard protocol during bolus administration of intravenous contrast. CONTRAST:  114mL OMNIPAQUE IOHEXOL 300 MG/ML  SOLN COMPARISON:  11/20/2020 FINDINGS: CT CHEST FINDINGS Cardiovascular: The heart is normal in size. No pericardial effusion. Stable tortuosity, ectasia and calcification of the thoracic aorta and stable branch vessel calcifications including extensive three-vessel coronary artery calcifications. Mediastinum/Nodes: No mediastinal or hilar mass or adenopathy. There is chronic soft tissue thickening around the mid esophagus likely related to treated tumor or possibly radiation affect. The esophagus is grossly normal. Lungs/Pleura: Chronic emphysematous changes and pulmonary scarring. No findings suspicious for recurrent lung cancer or pulmonary metastatic disease. Stable calcified granuloma in the right middle lobe. Musculoskeletal: Age age stable vertebral augmentation changes at T7 and remote T4 fracture. Remote partially united sternal fracture. No acute bony findings or worrisome bone lesions. Moderate osteoporosis. CT ABDOMEN PELVIS FINDINGS Hepatobiliary: No findings suspicious for hepatic metastatic disease. The gallbladder is surgically absent. No common bile duct dilatation. Pancreas:  Moderate pancreatic atrophy but no mass or inflammation. Spleen: Normal size. No focal lesions. Small accessory spleen noted. Adrenals/Urinary Tract: Adrenal glands are unremarkable and stable. No renal lesions or hydronephrosis. No bladder lesions or calculi. Mild trabeculation. Stomach/Bowel: The stomach, duodenum, small bowel and colon are unremarkable. Vascular/Lymphatic: Advanced atherosclerotic calcifications involving the aorta and iliac arteries but no aneurysm or dissection. Extensive branch  vessel calcifications are also noted. The major venous structures are patent. No mesenteric or retroperitoneal mass or adenopathy. Reproductive: The uterus and ovaries are unremarkable. Other: Stable appearing moderate-sized anterior abdominal wall hernia containing small bowel loops but no findings for incarceration or obstruction. Musculoskeletal: No acute bony findings or worrisome bone lesions. IMPRESSION: 1. Stable CT appearance of the chest, abdomen and pelvis. No findings suspicious for recurrent lung cancer or pulmonary metastatic disease. 2. Stable chronic soft tissue thickening around the mid esophagus likely related to treated tumor or possibly radiation effect. 3. Stable advanced atherosclerotic calcifications involving the thoracic and abdominal aorta and branch vessels including extensive three-vessel coronary artery calcifications. 4. Stable moderate-sized anterior abdominal wall hernia containing small bowel loops but no findings for incarceration or obstruction. 5. Emphysema and aortic atherosclerosis. Aortic Atherosclerosis (ICD10-I70.0) and Emphysema (ICD10-J43.9). Electronically Signed   By: Marijo Sanes M.D.   On: 02/26/2021 15:16     ASSESSMENT & PLAN:  1. Small cell lung cancer ([place])   2. Encounter for antineoplastic immunotherapy   3. 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 Osteopenia, unspecified location    #Extensive small cell lung cancer, S/p 4 cycles of Carboplatin, Etoposide and Tecentriq.   Currently on Tecentriq maintenance. Stable disease on recent CT scan.   Labs reviewed and discussed with patient Proceed with Tecentriq maintenance. MRI brain with and without contrast has been ordered and is scheduled at the end of May 2022 for surveillance.  # History bone metastasis, #Osteopenia  zometa every 4-6 weeks.  continue calcium and vitamin D.   We will proceed with Zometa in the next visit  #GERD, on omeprazole, stable symptoms.  Continue Protonix #  COPD/emphysema, Stable. Monitor.  Follow up in 3 weeks for next cycle of Tecentriq and Zometa.  Earlie Server, MD, PhD

## 2021-05-12 NOTE — Progress Notes (Signed)
Patient here for follow up. No new concerns voiced.  °

## 2021-05-12 NOTE — Patient Instructions (Signed)
[facility] [place] [facility]  Discharge Instructions: Thank you for choosing [facility] to provide your oncology and hematology care.  If you have a lab appointment with the Cancer Center, please go directly to the Cancer Center and check in at the registration area.  Wear comfortable clothing and clothing appropriate for easy access to any Portacath or PICC line.   We strive to give you quality time with your provider. You may need to reschedule your appointment if you arrive late (15 or more minutes).  Arriving late affects you and other patients whose appointments are after yours.  Also, if you miss three or more appointments without notifying the office, you may be dismissed from the clinic at the provider's discretion.      For prescription refill requests, have your pharmacy contact our office and allow 72 hours for refills to be completed.      To help prevent nausea and vomiting after your treatment, we encourage you to take your nausea medication as directed.  BELOW ARE SYMPTOMS THAT SHOULD BE REPORTED IMMEDIATELY: *FEVER GREATER THAN 100.4 F (38 C) OR HIGHER *CHILLS OR SWEATING *NAUSEA AND VOMITING THAT IS NOT CONTROLLED WITH YOUR NAUSEA MEDICATION *UNUSUAL SHORTNESS OF BREATH *UNUSUAL BRUISING OR BLEEDING *URINARY PROBLEMS (pain or burning when urinating, or frequent urination) *BOWEL PROBLEMS (unusual diarrhea, constipation, pain near the anus) TENDERNESS IN MOUTH AND THROAT WITH OR WITHOUT PRESENCE OF ULCERS (sore throat, sores in mouth, or a toothache) UNUSUAL RASH, SWELLING OR PAIN  UNUSUAL VAGINAL DISCHARGE OR ITCHING   Items with * indicate a potential emergency and should be followed up as soon as possible or go to the Emergency Department if any problems should occur.  Please show the CHEMOTHERAPY ALERT CARD or IMMUNOTHERAPY ALERT CARD at check-in to the Emergency Department and triage nurse.  Should you have questions after your  visit or need to cancel or reschedule your appointment, please contact [facility] [place] [facility]  [phone] and follow the prompts.  Office hours are 8:00 a.m. to 4:30 p.m. Monday - Friday. Please note that voicemails left after 4:00 p.m. may not be returned until the following business day.  We are closed weekends and major holidays. You have access to a nurse at all times for urgent questions. Please call the main number to the clinic [phone] and follow the prompts.  For any non-urgent questions, you may also contact your provider using MyChart. We now offer e-Visits for anyone 18 and older to request care online for non-urgent symptoms. For details visit [email or web address].   Also download the MyChart app! Go to the app store, search "MyChart", open the app, select [facility], and log in with your MyChart username and password.  Due to Covid, a mask is required upon entering the hospital/clinic. If you do not have a mask, one will be given to you upon arrival. For doctor visits, patients may have 1 support person aged 18 or older with them. For treatment visits, patients cannot have anyone with them due to current Covid guidelines and our immunocompromised population.  

## 2021-05-14 ENCOUNTER — Encounter: Payer: Self-pay | Admitting: Oncology

## 2021-05-20 ENCOUNTER — Ambulatory Visit
Admission: RE | Admit: 2021-05-20 | Discharge: 2021-05-20 | Disposition: A | Discharge status: Home / Self Care | Payer: Medicare Other | Source: Ambulatory Visit | Attending: Oncology | Admitting: Oncology

## 2021-05-20 DIAGNOSIS — C3490 Malignant neoplasm of unspecified part of unspecified bronchus or lung: Secondary | ICD-10-CM | POA: Insufficient documentation

## 2021-05-20 MED ORDER — GADOBUTROL 1 MMOL/ML IV SOLN
6.0000 mL | Freq: Once | INTRAVENOUS | Status: AC | PRN
  Administered 2021-05-20: 6 mL via INTRAVENOUS

## 2021-06-02 ENCOUNTER — Inpatient Hospital Stay: Payer: Medicare Other

## 2021-06-02 ENCOUNTER — Inpatient Hospital Stay: Payer: Medicare Other | Attending: Oncology | Admitting: Oncology

## 2021-06-02 ENCOUNTER — Other Ambulatory Visit: Payer: Self-pay

## 2021-06-02 ENCOUNTER — Encounter: Payer: Self-pay | Admitting: Oncology

## 2021-06-02 VITALS — BP 102/84 | HR 87 | Temp 98.7°F | Resp 18 | Wt 144.2 lb

## 2021-06-02 DIAGNOSIS — Z79899 Other long term (current) drug therapy: Secondary | ICD-10-CM | POA: Diagnosis not present

## 2021-06-02 DIAGNOSIS — R5383 Other fatigue: Secondary | ICD-10-CM | POA: Diagnosis not present

## 2021-06-02 DIAGNOSIS — Z95828 Presence of other vascular implants and grafts: Secondary | ICD-10-CM

## 2021-06-02 DIAGNOSIS — E860 Dehydration: Secondary | ICD-10-CM

## 2021-06-02 DIAGNOSIS — Z5112 Encounter for antineoplastic immunotherapy: Secondary | ICD-10-CM | POA: Insufficient documentation

## 2021-06-02 DIAGNOSIS — I6782 Cerebral ischemia: Secondary | ICD-10-CM

## 2021-06-02 DIAGNOSIS — Z87891 Personal history of nicotine dependence: Secondary | ICD-10-CM | POA: Insufficient documentation

## 2021-06-02 DIAGNOSIS — M8580 Other specified disorders of bone density and structure, unspecified site: Secondary | ICD-10-CM | POA: Diagnosis not present

## 2021-06-02 DIAGNOSIS — C3490 Malignant neoplasm of unspecified part of unspecified bronchus or lung: Secondary | ICD-10-CM | POA: Insufficient documentation

## 2021-06-02 LAB — CBC WITH DIFFERENTIAL/PLATELET
Abs Immature Granulocytes: 0.08 10*3/uL — ABNORMAL HIGH (ref 0.00–0.07)
Basophils Absolute: 0 10*3/uL (ref 0.0–0.1)
Basophils Relative: 0 %
Eosinophils Absolute: 0.4 10*3/uL (ref 0.0–0.5)
Eosinophils Relative: 3 %
HCT: 34.3 % — ABNORMAL LOW (ref 36.0–46.0)
Hemoglobin: 11.6 g/dL — ABNORMAL LOW (ref 12.0–15.0)
Immature Granulocytes: 1 %
Lymphocytes Relative: 14 %
Lymphs Abs: 1.5 10*3/uL (ref 0.7–4.0)
MCH: 30.2 pg (ref 26.0–34.0)
MCHC: 33.8 g/dL (ref 30.0–36.0)
MCV: 89.3 fL (ref 80.0–100.0)
Monocytes Absolute: 1.2 10*3/uL — ABNORMAL HIGH (ref 0.1–1.0)
Monocytes Relative: 11 %
Neutro Abs: 7.5 10*3/uL (ref 1.7–7.7)
Neutrophils Relative %: 71 %
Platelets: 193 10*3/uL (ref 150–400)
RBC: 3.84 MIL/uL — ABNORMAL LOW (ref 3.87–5.11)
RDW: 13.5 % (ref 11.5–15.5)
WBC: 10.7 10*3/uL — ABNORMAL HIGH (ref 4.0–10.5)
nRBC: 0 % (ref 0.0–0.2)

## 2021-06-02 LAB — LIPID PANEL
Cholesterol: 248 mg/dL — ABNORMAL HIGH (ref 0–200)
HDL: 59 mg/dL (ref >40)
LDL Cholesterol: 173 mg/dL — ABNORMAL HIGH (ref 0–99)
Total CHOL/HDL Ratio: 4.2 RATIO
Triglycerides: 78 mg/dL (ref <150)
VLDL: 16 mg/dL (ref 0–40)

## 2021-06-02 LAB — COMPREHENSIVE METABOLIC PANEL
ALT: 12 U/L (ref 0–44)
AST: 16 U/L (ref 15–41)
Albumin: 4.1 g/dL (ref 3.5–5.0)
Alkaline Phosphatase: 49 U/L (ref 38–126)
Anion gap: 10 (ref 5–15)
BUN: 16 mg/dL (ref 8–23)
CO2: 25 mmol/L (ref 22–32)
Calcium: 9.7 mg/dL (ref 8.9–10.3)
Chloride: 100 mmol/L (ref 98–111)
Creatinine, Ser: 0.81 mg/dL (ref 0.44–1.00)
GFR, Estimated: >60 mL/min (ref >60)
Glucose, Bld: 103 mg/dL — ABNORMAL HIGH (ref 70–99)
Potassium: 3.8 mmol/L (ref 3.5–5.1)
Sodium: 135 mmol/L (ref 135–145)
Total Bilirubin: 0.6 mg/dL (ref 0.3–1.2)
Total Protein: 7.3 g/dL (ref 6.5–8.1)

## 2021-06-02 MED ORDER — SODIUM CHLORIDE 0.9 % IV SOLN
Freq: Once | INTRAVENOUS | Status: AC
  Filled 2021-06-02: qty 250

## 2021-06-02 MED ORDER — SODIUM CHLORIDE 0.9% FLUSH
10.0000 mL | Freq: Once | INTRAVENOUS | Status: AC
Start: 2021-06-02 — End: 2021-06-02
  Administered 2021-06-02: 10 mL via INTRAVENOUS
  Filled 2021-06-02: qty 10

## 2021-06-02 MED ORDER — HEPARIN SOD (PORK) LOCK FLUSH 100 UNIT/ML IV SOLN
INTRAVENOUS | Status: AC
  Filled 2021-06-02: qty 5

## 2021-06-02 MED ORDER — SODIUM CHLORIDE 0.9 % IV SOLN
1200.0000 mg | Freq: Once | INTRAVENOUS | Status: AC
  Administered 2021-06-02: 1200 mg via INTRAVENOUS
  Filled 2021-06-02: qty 20

## 2021-06-02 MED ORDER — DEXAMETHASONE SODIUM PHOSPHATE 10 MG/ML IJ SOLN
10.0000 mg | Freq: Once | INTRAMUSCULAR | Status: AC
  Administered 2021-06-02: 10 mg via INTRAVENOUS
  Filled 2021-06-02: qty 1

## 2021-06-02 MED ORDER — HEPARIN SOD (PORK) LOCK FLUSH 100 UNIT/ML IV SOLN
500.0000 [IU] | Freq: Once | INTRAVENOUS | Status: AC
  Administered 2021-06-02: 500 [IU] via INTRAVENOUS
  Filled 2021-06-02: qty 5

## 2021-06-02 MED ORDER — ASPIRIN EC 81 MG PO TBEC: 81.0000 mg | DELAYED_RELEASE_TABLET | Freq: Every day | ORAL | 0 refills | Status: DC

## 2021-06-02 MED ORDER — ZOLEDRONIC ACID 4 MG/100ML IV SOLN
4.0000 mg | Freq: Once | INTRAVENOUS | Status: AC
  Administered 2021-06-02: 4 mg via INTRAVENOUS
  Filled 2021-06-02: qty 100

## 2021-06-02 NOTE — Progress Notes (Signed)
Hematology/Oncology Follow up note [facility] Telephone:([phone]) [phone] Fax:([phone]) [phone]   Patient Care Team: Glean Hess, MD as PCP - General (Internal Medicine) Charolette Forward, MD as Consulting Physician (Cardiology) Telford Nab, RN as Registered Nurse [name], MD as Radiation Oncologist (Radiation Oncology)   REASON FOR VISIT:  Follow-up for small cell lung cancer,   HISTORY OF PRESENTING ILLNESS:  Kristi [name] is a  75 y.o.  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  10/20/2018 CT chest with contrast showed large mediastinal mass involving both hilar, left greater than right, consistent with lung carcinoma, The mass causes narrowing of the left mainstem bronchus with resultant volume loss on the left and a mediastinal shift to the left.  Moderate size left pleural effusion. 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  # Initial MRI brain negative.  # Nov 2019- Jan 2020 s/p 4 cycles of Carbo/Etoposide/Tecentriq #  01/24/2019 interim CT scan done which showed continued positive response to therapy with continued reduction in mediastinal adenopathy.  No residual measurable left lung mass. CT findings of acute emphysematous cystitis. Urology did not feel that patient has pyelonephritis and recommend treatment with antibiotics because patient's immunocompromise. Patient finished treatment.   # 11/02/2018-01/09/2019 Chemotherapy carboplatin + Etoposide + tecentriq # Consolidation chest radiation and whole brain radiation finished in May 2020 # 04/25/2019 resume Tecentriq every 3 weeks. # Patient had a fall from her stairs on 01/19/2020. In the emergency room she had CT chest abdomen pelvis CT, CT head without contrast, CT cervical spine without contrast. No CT evidence for acute intracranial abnormality, degenerative  changes of cervical spine..  No CT evidence of acute thoracic abdomen injury.  Severe compression fracture of T7, subacute.  # kyphoplasty procedure on 03/01/2020.  T7 biopsy negative for cancer.  11/20/2020 Surveillance CT scan  Reduced size and prominence of right lateral lung base subpleural nodular density. No findings of active malignancy.  MRI brain showed stable post treatment brain. No metastatic disease.    INTERVAL HISTORY Kristi [name] is a 75 y.o. female who has above history reviewed by me today presents for evaluation prior to immunotherapy for treatment of extensive small cell lung cancer. Patient is on immunotherapy maintenance. She reports feeling tired.  Otherwise no new complaints. She is leaving today for a trip. During interval, patient had MRI of the brain done.  Denies any headache .  Review of Systems  Constitutional:  Positive for fatigue. Negative for appetite change, chills and fever.  HENT:   Negative for hearing loss and voice change.   Eyes:  Negative for eye problems.  Respiratory:  Negative for chest tightness, cough and shortness of breath.   Cardiovascular:  Negative for chest pain.  Gastrointestinal:  Negative for abdominal distention, abdominal pain, blood in stool, diarrhea and nausea.  Endocrine: Negative for hot flashes.  Genitourinary:  Negative for difficulty urinating and frequency.   Musculoskeletal:  Negative for arthralgias and back pain.  Skin:  Negative for itching and rash.  Neurological:  Negative for extremity weakness and headaches.  Hematological:  Negative for adenopathy.  Psychiatric/Behavioral:  Negative for confusion and depression. The patient is not nervous/anxious.        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[place]) 09/30/2014   1 stent   Claustrophobia  Coronary artery disease    GERD (gastroesophageal reflux disease)    Hypercholesteremia    MI, old     Pneumonia    Small cell lung cancer ([place])    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 ([place]) 10/27/2018    SURGICAL HISTORY: Past Surgical History:  Procedure Laterality Date   APPENDECTOMY     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   BLADDER NECK SUSPENSION     CHOLECYSTECTOMY N/A 08/14/2018   Procedure: LAPAROSCOPIC CHOLECYSTECTOMY;  Surgeon: Jules Husbands, MD;  Location: [facility] ORS;  Service: General;  Laterality: N/A;   CHOLECYSTECTOMY  11/2018   CORONARY ANGIOPLASTY  09/29/2014   CORONARY STENT PLACEMENT     ENDOBRONCHIAL ULTRASOUND N/A 10/21/2018   Procedure: ENDOBRONCHIAL ULTRASOUND;  Surgeon: Laverle Hobby, MD;  Location: [facility] ORS;  Service: Pulmonary;  Laterality: N/A;   HERNIA REPAIR  2012   IR FLUORO GUIDE CV LINE RIGHT  12/19/2018   KYPHOPLASTY N/A 03/01/2020   Procedure: T7 KYPHOPLASTY;  Surgeon: Hessie Knows, MD;  Location: [facility] ORS;  Service: Orthopedics;  Laterality: N/A;   LAPAROTOMY     LEFT HEART CATHETERIZATION WITH CORONARY ANGIOGRAM N/A 09/29/2014   Procedure: LEFT HEART CATHETERIZATION WITH CORONARY ANGIOGRAM;  Surgeon: Clent Demark, MD;  Location: [facility] CATH LAB;  Service: Cardiovascular;  Laterality: N/A;   PORTA CATH INSERTION N/A 01/02/2019   Procedure: PORTA CATH INSERTION;  Surgeon: Algernon Huxley, MD;  Location: [facility] CV LAB;  Service: Cardiovascular;  Laterality: N/A;   PORTACATH PLACEMENT Right 10/28/2018   Procedure: INSERTION PORT-A-CATH;  Surgeon: Jules Husbands, MD;  Location: [facility] ORS;  Service: General;  Laterality: Right;   XI ROBOTIC ASSISTED VENTRAL HERNIA N/A 02/27/2021   Procedure: XI ROBOTIC ASSISTED VENTRAL HERNIA (incisional) WITH MESH;  Surgeon: Olean Ree, MD;  Location: [facility] ORS;  Service: General;  Laterality: N/A;    SOCIAL HISTORY: Social History   Socioeconomic History   Marital status: Married    Spouse name: [name]    Number of children: 3   Years of education: Not on file   Highest education level: Not on file  Occupational  History   Occupation: Retired    Comment: Chief Technology Officer   Tobacco Use   Smoking status: Former    Packs/day: 1.00    Years: 39.00    Pack years: 39.00    Types: Cigarettes    Start date: 12/21/1978    Quit date: 10/16/2018    Years since quitting: 2.6   Smokeless tobacco: Never  Vaping Use   Vaping Use: Never used  Substance and Sexual Activity   Alcohol use: Yes    Alcohol/week: 0.0 standard drinks    Comment: occassional - approx 1 every 2 weeks    Drug use: No   Sexual activity: Yes    Birth control/protection: None  Other Topics Concern   Not on file  Social History Narrative   Not on file   Social Determinants of Health   Financial Resource Strain: Not on file  Food Insecurity: Not on file  Transportation Needs: Not on file  Physical Activity: Not on file  Stress: Not on file  Social Connections: Not on file  Intimate Partner Violence: Not on file    FAMILY HISTORY: Family History  Problem Relation Age of Onset   Alzheimer's disease Mother    Colon cancer Father    Breast cancer Neg Hx     ALLERGIES:  has No Known Allergies.  MEDICATIONS:  Current Outpatient  Medications  Medication Sig Dispense Refill   acetaminophen (TYLENOL) 500 MG tablet Take 2 tablets (1,000 mg total) by mouth every 6 (six) hours as needed for mild pain or headache.     aspirin EC 81 MG tablet Take 1 tablet (81 mg total) by mouth daily. Swallow whole. 30 tablet 0   Calcium 600-200 MG-UNIT tablet Take 1 tablet by mouth 2 (two) times daily.     Cholecalciferol (DIALYVITE VITAMIN D 5000) 125 MCG (5000 UT) capsule Take 5,000 Units by mouth daily.     lidocaine-prilocaine (EMLA) cream Apply 1 application topically daily as needed (port access). 30 g 2   Magnesium 250 MG TABS Take 250 mg by mouth daily.     oxyCODONE (ROXICODONE) 5 MG immediate release tablet Take 1 tablet (5 mg total) by mouth every 6 (six) hours as needed for severe pain. 30 tablet 0   pantoprazole (PROTONIX) 40 MG tablet  Take 1 tablet (40 mg total) by mouth 2 (two) times daily. 60 tablet 0   prochlorperazine (COMPAZINE) 10 MG tablet Take 10 mg by mouth every 6 (six) hours as needed for nausea or vomiting.     sertraline (ZOLOFT) 100 MG tablet Take 1 tablet (100 mg total) by mouth daily. 90 tablet 3   sucralfate (CARAFATE) 1 g tablet Take 1 g by mouth 3 (three) times daily as needed (heartburn).     traZODone (DESYREL) 50 MG tablet Take 1 tablet (50 mg total) by mouth at bedtime as needed for sleep. 30 tablet 2   vitamin B-12 (CYANOCOBALAMIN) 1000 MCG tablet Take 1,000 mcg by mouth daily.     No current facility-administered medications for this visit.   Facility-Administered Medications Ordered in Other Visits  Medication Dose Route Frequency Provider Last Rate Last Admin   heparin lock flush 100 unit/mL  500 Units Intravenous Once Earlie Server, MD       heparin lock flush 100 unit/mL  500 Units Intravenous Once Earlie Server, MD       sodium chloride flush (NS) 0.9 % injection 10 mL  10 mL Intravenous PRN Earlie Server, MD   10 mL at 01/09/19 0820   sodium chloride flush (NS) 0.9 % injection 10 mL  10 mL Intravenous PRN Earlie Server, MD   10 mL at 01/10/19 1400     PHYSICAL EXAMINATION: ECOG PERFORMANCE STATUS: 1 - Symptomatic but completely ambulatory Vitals:   06/02/21 1001  BP: 102/84  Pulse: 87  Resp: 18  Temp: 98.7 F (37.1 C)  SpO2: 100%   Filed Weights   06/02/21 1001  Weight: 144 lb 3.2 oz (65.4 kg)   Physical Examination Today's Vitals   06/02/21 1001  BP: 102/84  Pulse: 87  Resp: 18  Temp: 98.7 F (37.1 C)  SpO2: 100%  Weight: 144 lb 3.2 oz (65.4 kg)  PainSc: 0-No pain   Body mass index is 22.58 kg/m.   Physical Exam Constitutional:      General: She is not in acute distress.    Appearance: She is not diaphoretic.     Comments: Patient sits in the wheelchair.  HENT:     Head: Normocephalic and atraumatic.     Nose: Nose normal.     Mouth/Throat:     Pharynx: No oropharyngeal  exudate.  Eyes:     General: No scleral icterus.    Pupils: Pupils are equal, round, and reactive to light.  Cardiovascular:     Rate and Rhythm: Normal rate and regular rhythm.  Heart sounds: No murmur heard. Pulmonary:     Effort: Pulmonary effort is normal. No respiratory distress.     Breath sounds: No wheezing or rales.     Comments: Decreased breath sounds bilaterally.  Abdominal:     General: There is no distension.     Palpations: Abdomen is soft.     Tenderness: There is no abdominal tenderness.  Musculoskeletal:        General: Normal range of motion.     Cervical back: Normal range of motion and neck supple.  Skin:    General: Skin is warm and dry.     Findings: No erythema.  Neurological:     Mental Status: She is alert and oriented to person, place, and time.     Cranial Nerves: No cranial nerve deficit.     Motor: No abnormal muscle tone.     Coordination: Coordination normal.  Psychiatric:        Mood and Affect: Affect normal.       LABORATORY DATA:  I have reviewed the data as listed Lab Results  Component Value Date   WBC 10.7 (H) 06/02/2021   HGB 11.6 (L) 06/02/2021   HCT 34.3 (L) 06/02/2021   MCV 89.3 06/02/2021   PLT 193 06/02/2021   Recent Labs    08/12/20 1315 09/02/20 1358 09/23/20 0845 10/14/20 0839 04/21/21 0836 05/12/21 0834 06/02/21 0910  NA 134* 137 138   < > 135 138 135  K 4.0 3.5 4.0   < > 4.1 3.8 3.8  CL 104 105 106   < > 100 103 100  CO2 23 24 24    < > 24 25 25   GLUCOSE 98 103* 91   < > 112* 100* 103*  BUN 13 18 14    < > 20 13 16   CREATININE 0.81 0.68 0.75   < > 0.80 0.78 0.81  CALCIUM 8.8* 8.3* 8.6*   < > 9.4 9.5 9.7  GFRNONAA >60 >60 >60   < > >60 >60 >60  GFRAA >60 >60 >60  --   --   --   --   PROT 7.4 6.8 7.0   < > 7.1 6.9 7.3  ALBUMIN 4.0 3.7 3.8   < > 4.0 4.1 4.1  AST 17 14* 17   < > 22 16 16   ALT 12 11 11    < > 13 12 12   ALKPHOS 49 49 47   < > 41 42 49  BILITOT 0.5 0.5 0.6   < > 0.5 0.5 0.6   < > = values  in this interval not displayed.     RADIOGRAPHIC STUDIES: I have personally reviewed the radiological images as listed and agreed with the findings in the report.  MR Brain W Wo Contrast  Result Date: 05/20/2021 CLINICAL DATA:  75 year old female with small cell lung cancer status post prophylactic whole brain radiation in June 2020. Restaging. EXAM: MRI HEAD WITHOUT AND WITH CONTRAST TECHNIQUE: Multiplanar, multiecho pulse sequences of the brain and surrounding structures were obtained without and with intravenous contrast. CONTRAST:  16mL GADAVIST GADOBUTROL 1 MMOL/ML IV SOLN COMPARISON:  11/20/2020 brain MRI and earlier. FINDINGS: Brain: There are 2 small foci of white matter restricted diffusion in the anterior right frontal lobe. The larger is near the frontal horn on series 3, image 30 (series 4, image 30) and the smaller in the anterior subcortical white matter or centrum semi of bowel on image 37 (series 4, image 37). Neither is  associated with abnormal enhancement, hemorrhage, or mass effect. Questionable small linear diffusion abnormality also in the right cerebellum on series 3, image 13, although no obvious ADC correlate. No associated enhancement. No other restricted diffusion. No abnormal enhancement identified. No dural thickening. No midline shift, mass effect, evidence of mass lesion, ventriculomegaly, extra-axial collection or acute intracranial hemorrhage. Cervicomedullary junction and pituitary are within normal limits. Stable cerebral volume since last year. Advanced bilateral white matter T2 and FLAIR hyperintensity redemonstrated, including involvement of the bilateral deep white matter capsules, and likely XRT related. Similar patchy T2 heterogeneity in the deep gray matter nuclei and brainstem. No chronic cerebral blood products. Vascular: Major intracranial vascular flow voids are stable. Skull and upper cervical spine: Negative visible cervical spine and spinal cord. Visualized bone  marrow signal is within normal limits. Sinuses/Orbits: Stable, negative. Other: Trace mastoid air cell fluid is stable. Negative visible scalp soft tissues. IMPRESSION: 1. Two small foci of restricted diffusion in the right frontal lobe white matter do not enhance and are most compatible with acute small vessel ischemia. Questionable third small focus in the right cerebellum also. 2. Otherwise stable signal changes of whole brain radiation. No metastatic disease identified. Electronically Signed   By: Genevie Ann M.D.   On: 05/20/2021 12:08      ASSESSMENT & PLAN:  1. Small cell lung cancer ([place])   2. Encounter for antineoplastic immunotherapy   3. Osteopenia, unspecified location   4. Brain ischemia   5. Other fatigue    #Extensive small cell lung cancer, S/p 4 cycles of Carboplatin, Etoposide and Tecentriq.   Currently on Tecentriq maintenance. Labs reviewed and discussed with patient. Proceed with Tecentriq maintenance today 05/20/2021 MRI brain with and without contrast showed 2 small foci of restricted diffusion in the right frontal lobe.  Most likely acute small vessel ischemia.  Questionable third small focus in the right cerebellum. Recommend patient to start aspirin 81 mg.  Patient has supply at home. Check lipid panel-fasting  #Fatigue, check TSH and free T4 at next visit. # History bone metastasis, #Osteopenia  zometa every 4-6 weeks.  continue calcium and vitamin D.   Proceed with Zometa today.  #GERD, on omeprazole, stable symptoms.  Continue Protonix # COPD/emphysema, Stable. Monitor.  Follow up in 3 weeks for next cycle of Tecentriq and Zometa.  Earlie Server, MD, PhD

## 2021-06-02 NOTE — Progress Notes (Signed)
Patient here for follow up. Reports feeling "extra tired" today.

## 2021-06-02 NOTE — Patient Instructions (Signed)
[facility]   Discharge Instructions: Thank you for choosing [facility] to provide your oncology and hematology care.  If you have a lab appointment with the [facility], please go directly to the [facility] and check in at the registration area.  Wear comfortable clothing and clothing appropriate for easy access to any Portacath or PICC line.   We strive to give you quality time with your provider. You may need to reschedule your appointment if you arrive late (15 or more minutes).  Arriving late affects you and other patients whose appointments are after yours.  Also, if you miss three or more appointments without notifying the office, you may be dismissed from the clinic at the provider's discretion.      For prescription refill requests, have your pharmacy contact our office and allow 72 hours for refills to be completed.    Today you received the following chemotherapy and/or immunotherapy agents: Tecentriq. Today you also received the following: Zometa.      To help prevent nausea and vomiting after your treatment, we encourage you to take your nausea medication as directed.  BELOW ARE SYMPTOMS THAT SHOULD BE REPORTED IMMEDIATELY: *FEVER GREATER THAN 100.4 F (38 C) OR HIGHER *CHILLS OR SWEATING *NAUSEA AND VOMITING THAT IS NOT CONTROLLED WITH YOUR NAUSEA MEDICATION *UNUSUAL SHORTNESS OF BREATH *UNUSUAL BRUISING OR BLEEDING *URINARY PROBLEMS (pain or burning when urinating, or frequent urination) *BOWEL PROBLEMS (unusual diarrhea, constipation, pain near the anus) TENDERNESS IN MOUTH AND THROAT WITH OR WITHOUT PRESENCE OF ULCERS (sore throat, sores in mouth, or a toothache) UNUSUAL RASH, SWELLING OR PAIN  UNUSUAL VAGINAL DISCHARGE OR ITCHING   Items with * indicate a potential emergency and should be followed up as soon as possible or go to the Emergency Department if any problems should occur.  Please show the CHEMOTHERAPY  ALERT CARD or IMMUNOTHERAPY ALERT CARD at check-in to the Emergency Department and triage nurse.  Should you have questions after your visit or need to cancel or reschedule your appointment, please contact [facility]  [phone] and follow the prompts.  Office hours are 8:00 a.m. to 4:30 p.m. Monday - Friday. Please note that voicemails left after 4:00 p.m. may not be returned until the following business day.  We are closed weekends and major holidays. You have access to a nurse at all times for urgent questions. Please call the main number to the clinic [phone] and follow the prompts.  For any non-urgent questions, you may also contact your provider using MyChart. We now offer e-Visits for anyone 48 and older to request care online for non-urgent symptoms. For details visit [email or web address].   Also download the MyChart app! Go to the app store, search "MyChart", open the app, select [facility], and log in with your MyChart username and password.  Due to Covid, a mask is required upon entering the hospital/clinic. If you do not have a mask, one will be given to you upon arrival. For doctor visits, patients may have 1 support person aged 47 or older with them. For treatment visits, patients cannot have anyone with them due to current Covid guidelines and our immunocompromised population.

## 2021-06-03 NOTE — Progress Notes (Signed)
Results have been forwarded to PCP

## 2021-06-03 NOTE — Progress Notes (Signed)
Results forwarded to PCP

## 2021-06-03 NOTE — Progress Notes (Signed)
Forwarded to PCP.

## 2021-06-16 ENCOUNTER — Ambulatory Visit
Admission: RE | Admit: 2021-06-16 | Discharge: 2021-06-16 | Disposition: A | Discharge status: Home / Self Care | Payer: Medicare Other | Source: Ambulatory Visit | Attending: Oncology | Admitting: Oncology

## 2021-06-16 ENCOUNTER — Other Ambulatory Visit: Payer: Self-pay

## 2021-06-16 DIAGNOSIS — J432 Centrilobular emphysema: Secondary | ICD-10-CM | POA: Diagnosis not present

## 2021-06-16 DIAGNOSIS — K5730 Diverticulosis of large intestine without perforation or abscess without bleeding: Secondary | ICD-10-CM | POA: Diagnosis not present

## 2021-06-16 DIAGNOSIS — C3490 Malignant neoplasm of unspecified part of unspecified bronchus or lung: Secondary | ICD-10-CM | POA: Insufficient documentation

## 2021-06-16 MED ORDER — IOHEXOL 300 MG/ML  SOLN
100.0000 mL | Freq: Once | INTRAMUSCULAR | Status: AC | PRN
  Administered 2021-06-16: 100 mL via INTRAVENOUS

## 2021-06-18 ENCOUNTER — Encounter: Payer: Self-pay | Admitting: Internal Medicine

## 2021-06-18 DIAGNOSIS — I700 Atherosclerosis of aorta: Secondary | ICD-10-CM | POA: Insufficient documentation

## 2021-06-19 ENCOUNTER — Encounter: Payer: Self-pay | Admitting: Internal Medicine

## 2021-06-19 ENCOUNTER — Ambulatory Visit (INDEPENDENT_AMBULATORY_CARE_PROVIDER_SITE_OTHER): Payer: Medicare Other | Admitting: Internal Medicine

## 2021-06-19 ENCOUNTER — Other Ambulatory Visit: Payer: Self-pay

## 2021-06-19 VITALS — BP 102/78 | HR 91 | Temp 97.7°F | Ht 67.0 in | Wt 141.0 lb

## 2021-06-19 DIAGNOSIS — I25118 Atherosclerotic heart disease of native coronary artery with other forms of angina pectoris: Secondary | ICD-10-CM

## 2021-06-19 DIAGNOSIS — G3184 Mild cognitive impairment, so stated: Secondary | ICD-10-CM

## 2021-06-19 DIAGNOSIS — I700 Atherosclerosis of aorta: Secondary | ICD-10-CM | POA: Diagnosis not present

## 2021-06-19 DIAGNOSIS — I25119 Atherosclerotic heart disease of native coronary artery with unspecified angina pectoris: Secondary | ICD-10-CM

## 2021-06-19 DIAGNOSIS — R269 Unspecified abnormalities of gait and mobility: Secondary | ICD-10-CM

## 2021-06-19 MED ORDER — ATORVASTATIN CALCIUM 10 MG PO TABS: 10.0000 mg | ORAL_TABLET | Freq: Every day | ORAL | 3 refills | Status: DC

## 2021-06-19 NOTE — Progress Notes (Signed)
Date:  06/19/2021   Name:  Kristi [name] [facility]   DOB:  1946/03/19   MRN:  [id number]   Chief Complaint: Hyperlipidemia (Wants cholesterol checked per Dr. Earlie Server to see if she need meds ), atherosclerosis, and Memory Loss (6CIT- 4 did take a while and some coaching )  Hyperlipidemia This is a chronic problem. Recent lipid tests were reviewed and are high. She has no history of chronic renal disease, diabetes or liver disease. Pertinent negatives include no chest pain or shortness of breath. She is currently on no antihyperlipidemic treatment. Risk factors: hx of CAD and stents ~2015.   Weakness/hx of falls - she has fallen in the past and now is very afraid to anywhere that is a flat surface. She has a rolator but does not use it often.  Due to several years of lung cancer treatment she has been very inactive.  She is not doing any regular exercise.  CAD - she had stents placed around 2015 but never took a statin.  She was on aspirin until she started chemotherapy.  She is now back on aspirin.  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  MRI of the brain showed XRT changes and two tiny frontal lesions c/w ischemic small vessel disease.  These finding were discussed with patient and husband.[name] Kitchen  6-CIT = 4 Lab Results  Component Value Date   CREATININE 0.81 06/02/2021   BUN 16 06/02/2021   NA 135 06/02/2021   K 3.8 06/02/2021   CL 100 06/02/2021   CO2 25 06/02/2021   Lab Results  Component Value Date   CHOL 248 (H) 06/02/2021   HDL 59 06/02/2021   LDLCALC 173 (H) 06/02/2021   TRIG 78 06/02/2021   CHOLHDL 4.2 06/02/2021   Lab Results  Component Value Date   TSH 0.905 03/31/2021   Lab Results  Component Value Date   HGBA1C 5.3 09/19/2018   Lab Results  Component Value Date   WBC 10.7 (H) 06/02/2021   HGB 11.6 (L) 06/02/2021   HCT 34.3 (L) 06/02/2021   MCV 89.3 06/02/2021   PLT 193 06/02/2021   Lab Results  Component Value Date   ALT 12  06/02/2021   AST 16 06/02/2021   ALKPHOS 49 06/02/2021   BILITOT 0.6 06/02/2021     Review of Systems  Constitutional:  Positive for fatigue. Negative for chills and unexpected weight change.  Respiratory:  Negative for chest tightness, shortness of breath and wheezing.   Cardiovascular:  Negative for chest pain, palpitations and leg swelling.  Musculoskeletal:  Positive for gait problem.  Neurological:  Positive for weakness (general debility). Negative for dizziness and headaches.  Psychiatric/Behavioral:  Negative for dysphoric mood and sleep disturbance. The patient is not nervous/anxious.    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   Diarrhea 12/05/2020   Osteopenia 08/12/2020   Ventral hernia without obstruction or gangrene 08/12/2020   Thoracic compression fracture, closed, initial encounter ([place]) 01/22/2020   Frequent falls 01/22/2020   Encounter for antineoplastic immunotherapy 09/14/2019   Memory loss 09/14/2019   Other fatigue 09/14/2019   Cancer of hilus of left lung ([place]) 06/08/2019   Dehydration 01/30/2019   Acute pyelonephritis 01/24/2019   Anemia due to antineoplastic chemotherapy 01/09/2019   Encounter for antineoplastic chemotherapy 11/02/2018  Cough 10/28/2018   Dysphagia 10/28/2018   Decrease in appetite 10/28/2018   Goals of care, counseling/discussion 10/28/2018   Small cell lung cancer ([place]) 10/27/2018   Grade I diastolic dysfunction 40/98/1191   Localized edema 08/29/2018   Cholecystitis 08/13/2018   Osteopenia determined by x-ray 05/19/2018   Prediabetes 02/08/2018   Hx of fracture of radius 08/18/2016   Vitamin D deficiency 01/07/2016   Hyperlipidemia 01/02/2016   Hypertension 01/02/2016   Chronic pain 01/02/2016   Obesity (BMI 30.0-34.9) 01/02/2016    Coronary artery disease with stable angina pectoris ([place]) 01/02/2016   IBS (irritable bowel syndrome) 01/02/2016   FH: colon cancer 01/02/2016   DDD (degenerative disc disease), cervical 01/02/2016   Primary osteoarthritis of left knee 01/02/2016   Tobacco use disorder 01/02/2016    No Known Allergies  Past Surgical History:  Procedure Laterality Date   APPENDECTOMY     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   BLADDER NECK SUSPENSION     CHOLECYSTECTOMY N/A 08/14/2018   Procedure: LAPAROSCOPIC CHOLECYSTECTOMY;  Surgeon: Jules Husbands, MD;  Location: [facility] ORS;  Service: General;  Laterality: N/A;   CHOLECYSTECTOMY  11/2018   CORONARY ANGIOPLASTY  09/29/2014   CORONARY STENT PLACEMENT     ENDOBRONCHIAL ULTRASOUND N/A 10/21/2018   Procedure: ENDOBRONCHIAL ULTRASOUND;  Surgeon: Laverle Hobby, MD;  Location: [facility] ORS;  Service: Pulmonary;  Laterality: N/A;   HERNIA REPAIR  2012   IR FLUORO GUIDE CV LINE RIGHT  12/19/2018   KYPHOPLASTY N/A 03/01/2020   Procedure: T7 KYPHOPLASTY;  Surgeon: Hessie Knows, MD;  Location: [facility] ORS;  Service: Orthopedics;  Laterality: N/A;   LAPAROTOMY     LEFT HEART CATHETERIZATION WITH CORONARY ANGIOGRAM N/A 09/29/2014   Procedure: LEFT HEART CATHETERIZATION WITH CORONARY ANGIOGRAM;  Surgeon: Clent Demark, MD;  Location: [facility] CATH LAB;  Service: Cardiovascular;  Laterality: N/A;   PORTA CATH INSERTION N/A 01/02/2019   Procedure: PORTA CATH INSERTION;  Surgeon: Algernon Huxley, MD;  Location: [facility] CV LAB;  Service: Cardiovascular;  Laterality: N/A;   PORTACATH PLACEMENT Right 10/28/2018   Procedure: INSERTION PORT-A-CATH;  Surgeon: Jules Husbands, MD;  Location: [facility] ORS;  Service: General;  Laterality: Right;   XI ROBOTIC ASSISTED VENTRAL HERNIA N/A 02/27/2021   Procedure: XI ROBOTIC ASSISTED VENTRAL HERNIA (incisional) WITH MESH;  Surgeon: Olean Ree, MD;  Location: [facility] ORS;  Service: General;  Laterality: N/A;    Social History   Tobacco Use    Smoking status: Former    Packs/day: 1.00    Years: 39.00    Pack years: 39.00    Types: Cigarettes    Start date: 12/21/1978    Quit date: 10/16/2018    Years since quitting: 2.6   Smokeless tobacco: Never  Vaping Use   Vaping Use: Never used  Substance Use Topics   Alcohol use: Yes    Alcohol/week: 0.0 standard drinks    Comment: occassional - approx 1 every 2 weeks    Drug use: No     Medication list has been reviewed and updated.  Current Meds  Medication Sig   acetaminophen (TYLENOL) 500 MG tablet Take 2 tablets (1,000 mg total) by mouth every 6 (six) hours as needed for mild pain or headache.   aspirin EC 81 MG tablet Take 1 tablet (81 mg total) by mouth daily. Swallow whole.   atorvastatin (LIPITOR) 10 MG tablet Take 1 tablet (10 mg total) by mouth daily.   Calcium 600-200 MG-UNIT tablet Take 1 tablet  by mouth 2 (two) times daily.   Cholecalciferol (DIALYVITE VITAMIN D 5000) 125 MCG (5000 UT) capsule Take 5,000 Units by mouth daily.   lidocaine-prilocaine (EMLA) cream Apply 1 application topically daily as needed (port access).   Magnesium 250 MG TABS Take 250 mg by mouth daily.   oxyCODONE (ROXICODONE) 5 MG immediate release tablet Take 1 tablet (5 mg total) by mouth every 6 (six) hours as needed for severe pain.   pantoprazole (PROTONIX) 40 MG tablet Take 1 tablet (40 mg total) by mouth 2 (two) times daily.   prochlorperazine (COMPAZINE) 10 MG tablet Take 10 mg by mouth every 6 (six) hours as needed for nausea or vomiting.   sertraline (ZOLOFT) 100 MG tablet Take 1 tablet (100 mg total) by mouth daily.   sucralfate (CARAFATE) 1 g tablet Take 1 g by mouth 3 (three) times daily as needed (heartburn).   traZODone (DESYREL) 50 MG tablet Take 1 tablet (50 mg total) by mouth at bedtime as needed for sleep.   vitamin B-12 (CYANOCOBALAMIN) 1000 MCG tablet Take 1,000 mcg by mouth daily.    PHQ 2/9 Scores 06/19/2021 02/12/2021 09/19/2018 02/08/2018  PHQ - 2 Score 2 2 0 0  PHQ- 9  Score 5 4 - -    GAD 7 : Generalized Anxiety Score 06/19/2021 02/12/2021  Nervous, Anxious, on Edge 1 0  Control/stop worrying 1 0  Worry too much - different things 1 0  Trouble relaxing 0 0  Restless 0 0  Easily annoyed or irritable 1 0  Afraid - awful might happen 0 0  Total GAD 7 Score 4 0  Anxiety Difficulty - Not difficult at all   6CIT Screen 06/19/2021 09/19/2018  What Year? 0 points 0 points  What month? 0 points 0 points  What time? 0 points 0 points  Count back from 20 0 points 0 points  Months in reverse 0 points 0 points  Repeat phrase 4 points 4 points  Total Score 4 4     BP Readings from Last 3 Encounters:  06/19/21 102/78  06/02/21 102/84  05/12/21 108/73    Physical Exam Constitutional:      Appearance: Normal appearance.  Cardiovascular:     Rate and Rhythm: Normal rate and regular rhythm.     Pulses: Normal pulses.     Heart sounds: No murmur heard. Pulmonary:     Effort: Pulmonary effort is normal.     Breath sounds: Decreased breath sounds present. No wheezing or rhonchi.  Musculoskeletal:     Right lower leg: No edema.     Left lower leg: No edema.  Lymphadenopathy:     Cervical: No cervical adenopathy.  Neurological:     Mental Status: She is alert.     Motor: Weakness present.     Gait: Gait abnormal.     Comments: Coordination not tested  Psychiatric:        Attention and Perception: Attention normal.        Mood and Affect: Mood normal.        Speech: Speech is delayed.        Behavior: Behavior normal.        Cognition and Memory: She exhibits impaired recent memory.        Judgment: Judgment normal.    Wt Readings from Last 3 Encounters:  06/19/21 141 lb (64 kg)  06/02/21 144 lb 3.2 oz (65.4 kg)  05/12/21 145 lb 12.8 oz (66.1 kg)    BP 102/78  Pulse 91   Temp 97.7 F (36.5 C) (Oral)   Ht 5\' 7"  (1.702 m)   Wt 141 lb (64 kg)   SpO2 96%   BMI 22.08 kg/m   Assessment and Plan: 1. Gait disturbance Needs some targeted  PTx so will refer Continue to use rolator for community ambulation - Ambulatory referral to Physical Therapy  2. Aortic atherosclerosis (HCC) Discussed the pros and cons of statin therapy and she agrees to begin Call if she has any adverse side effects Follow up with labs in 3 months - atorvastatin (LIPITOR) 10 MG tablet; Take 1 tablet (10 mg total) by mouth daily.  Dispense: 90 tablet; Refill: 3  3. Coronary artery disease of native artery of native heart with stable angina pectoris (HCC) Continue aspirin daily Statin started today.  4. 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 Discussed the need to control BP and increase physical and mental exercise No specific therapy is available.   Partially dictated using Editor, commissioning. Any errors are unintentional.  Halina Maidens, MD [facility]  06/19/2021

## 2021-06-24 ENCOUNTER — Inpatient Hospital Stay: Payer: Medicare Other | Attending: Oncology | Admitting: Oncology

## 2021-06-24 ENCOUNTER — Other Ambulatory Visit: Payer: Medicare Other

## 2021-06-24 ENCOUNTER — Other Ambulatory Visit: Payer: Self-pay

## 2021-06-24 ENCOUNTER — Inpatient Hospital Stay: Payer: Medicare Other

## 2021-06-24 ENCOUNTER — Encounter: Payer: Self-pay | Admitting: Oncology

## 2021-06-24 VITALS — BP 106/82 | HR 88 | Temp 98.1°F | Resp 17 | Wt 141.0 lb

## 2021-06-24 VITALS — BP 101/74 | HR 78 | Resp 16

## 2021-06-24 DIAGNOSIS — Z79899 Other long term (current) drug therapy: Secondary | ICD-10-CM | POA: Diagnosis not present

## 2021-06-24 DIAGNOSIS — Z5112 Encounter for antineoplastic immunotherapy: Secondary | ICD-10-CM | POA: Insufficient documentation

## 2021-06-24 DIAGNOSIS — C3490 Malignant neoplasm of unspecified part of unspecified bronchus or lung: Secondary | ICD-10-CM

## 2021-06-24 DIAGNOSIS — Z87891 Personal history of nicotine dependence: Secondary | ICD-10-CM | POA: Diagnosis not present

## 2021-06-24 DIAGNOSIS — M8580 Other specified disorders of bone density and structure, unspecified site: Secondary | ICD-10-CM | POA: Diagnosis not present

## 2021-06-24 DIAGNOSIS — K219 Gastro-esophageal reflux disease without esophagitis: Secondary | ICD-10-CM | POA: Diagnosis not present

## 2021-06-24 DIAGNOSIS — R5383 Other fatigue: Secondary | ICD-10-CM

## 2021-06-24 LAB — CBC WITH DIFFERENTIAL/PLATELET
Abs Immature Granulocytes: 0.06 10*3/uL (ref 0.00–0.07)
Basophils Absolute: 0.1 10*3/uL (ref 0.0–0.1)
Basophils Relative: 1 %
Eosinophils Absolute: 0.3 10*3/uL (ref 0.0–0.5)
Eosinophils Relative: 4 %
HCT: 35.2 % — ABNORMAL LOW (ref 36.0–46.0)
Hemoglobin: 11.6 g/dL — ABNORMAL LOW (ref 12.0–15.0)
Immature Granulocytes: 1 %
Lymphocytes Relative: 25 %
Lymphs Abs: 1.8 10*3/uL (ref 0.7–4.0)
MCH: 29.9 pg (ref 26.0–34.0)
MCHC: 33 g/dL (ref 30.0–36.0)
MCV: 90.7 fL (ref 80.0–100.0)
Monocytes Absolute: 1 10*3/uL (ref 0.1–1.0)
Monocytes Relative: 14 %
Neutro Abs: 4 10*3/uL (ref 1.7–7.7)
Neutrophils Relative %: 55 %
Platelets: 239 10*3/uL (ref 150–400)
RBC: 3.88 MIL/uL (ref 3.87–5.11)
RDW: 13.7 % (ref 11.5–15.5)
WBC: 7.3 10*3/uL (ref 4.0–10.5)
nRBC: 0 % (ref 0.0–0.2)

## 2021-06-24 LAB — COMPREHENSIVE METABOLIC PANEL
ALT: 13 U/L (ref 0–44)
AST: 24 U/L (ref 15–41)
Albumin: 4 g/dL (ref 3.5–5.0)
Alkaline Phosphatase: 58 U/L (ref 38–126)
Anion gap: 9 (ref 5–15)
BUN: 11 mg/dL (ref 8–23)
CO2: 25 mmol/L (ref 22–32)
Calcium: 9.7 mg/dL (ref 8.9–10.3)
Chloride: 100 mmol/L (ref 98–111)
Creatinine, Ser: 0.71 mg/dL (ref 0.44–1.00)
GFR, Estimated: >60 mL/min (ref >60)
Glucose, Bld: 94 mg/dL (ref 70–99)
Potassium: 3.8 mmol/L (ref 3.5–5.1)
Sodium: 134 mmol/L — ABNORMAL LOW (ref 135–145)
Total Bilirubin: 0.7 mg/dL (ref 0.3–1.2)
Total Protein: 7.5 g/dL (ref 6.5–8.1)

## 2021-06-24 LAB — T4, FREE: Free T4: 0.91 ng/dL (ref 0.61–1.12)

## 2021-06-24 LAB — TSH: TSH: 2.452 u[IU]/mL (ref 0.350–4.500)

## 2021-06-24 MED ORDER — HEPARIN SOD (PORK) LOCK FLUSH 100 UNIT/ML IV SOLN
500.0000 [IU] | Freq: Once | INTRAVENOUS | Status: AC | PRN
  Administered 2021-06-24: 500 [IU]
  Filled 2021-06-24: qty 5

## 2021-06-24 MED ORDER — DEXAMETHASONE SODIUM PHOSPHATE 10 MG/ML IJ SOLN
10.0000 mg | Freq: Once | INTRAMUSCULAR | Status: AC
  Administered 2021-06-24: 10 mg via INTRAVENOUS
  Filled 2021-06-24: qty 1

## 2021-06-24 MED ORDER — SODIUM CHLORIDE 0.9 % IV SOLN
Freq: Once | INTRAVENOUS | Status: AC
  Filled 2021-06-24: qty 250

## 2021-06-24 MED ORDER — SODIUM CHLORIDE 0.9 % IV SOLN
1200.0000 mg | Freq: Once | INTRAVENOUS | Status: AC
  Administered 2021-06-24: 1200 mg via INTRAVENOUS
  Filled 2021-06-24: qty 20

## 2021-06-24 MED ORDER — HEPARIN SOD (PORK) LOCK FLUSH 100 UNIT/ML IV SOLN
INTRAVENOUS | Status: AC
  Filled 2021-06-24: qty 5

## 2021-06-24 NOTE — Patient Instructions (Signed)
[name]  Discharge Instructions: Thank you for choosing [facility] to provide your oncology and hematology care.  If you have a lab appointment with the [facility], please go directly to the [facility] and check in at the registration area.  Wear comfortable clothing and clothing appropriate for easy access to any Portacath or PICC line.   We strive to give you quality time with your provider. You may need to reschedule your appointment if you arrive late (15 or more minutes).  Arriving late affects you and other patients whose appointments are after yours.  Also, if you miss three or more appointments without notifying the office, you may be dismissed from the clinic at the provider's discretion.      For prescription refill requests, have your pharmacy contact our office and allow 72 hours for refills to be completed.    Today you received the following chemotherapy and/or immunotherapy agents Tecentriq      To help prevent nausea and vomiting after your treatment, we encourage you to take your nausea medication as directed.  BELOW ARE SYMPTOMS THAT SHOULD BE REPORTED IMMEDIATELY: *FEVER GREATER THAN 100.4 F (38 C) OR HIGHER *CHILLS OR SWEATING *NAUSEA AND VOMITING THAT IS NOT CONTROLLED WITH YOUR NAUSEA MEDICATION *UNUSUAL SHORTNESS OF BREATH *UNUSUAL BRUISING OR BLEEDING *URINARY PROBLEMS (pain or burning when urinating, or frequent urination) *BOWEL PROBLEMS (unusual diarrhea, constipation, pain near the anus) TENDERNESS IN MOUTH AND THROAT WITH OR WITHOUT PRESENCE OF ULCERS (sore throat, sores in mouth, or a toothache) UNUSUAL RASH, SWELLING OR PAIN  UNUSUAL VAGINAL DISCHARGE OR ITCHING   Items with * indicate a potential emergency and should be followed up as soon as possible or go to the Emergency Department if any problems should occur.  Please show the CHEMOTHERAPY ALERT CARD or IMMUNOTHERAPY ALERT CARD at check-in  to the Emergency Department and triage nurse.  Should you have questions after your visit or need to cancel or reschedule your appointment, please contact [facility]  [phone] and follow the prompts.  Office hours are 8:00 a.m. to 4:30 p.m. Monday - Friday. Please note that voicemails left after 4:00 p.m. may not be returned until the following business day.  We are closed weekends and major holidays. You have access to a nurse at all times for urgent questions. Please call the main number to the clinic [phone] and follow the prompts.  For any non-urgent questions, you may also contact your provider using MyChart. We now offer e-Visits for anyone 69 and older to request care online for non-urgent symptoms. For details visit [email or web address].   Also download the MyChart app! Go to the app store, search "MyChart", open the app, select [facility], and log in with your MyChart username and password.  Due to Covid, a mask is required upon entering the hospital/clinic. If you do not have a mask, one will be given to you upon arrival. For doctor visits, patients may have 1 support person aged 52 or older with them. For treatment visits, patients cannot have anyone with them due to current Covid guidelines and our immunocompromised population.

## 2021-06-24 NOTE — Progress Notes (Signed)
Patient here for oncology follow-up appointment, expresses concerns of ocassional SOB

## 2021-06-24 NOTE — Progress Notes (Signed)
Hematology/Oncology Follow up note [facility] Telephone:(336) [phone] Fax:(336) [phone]   Patient Care Team: Glean Hess, MD as PCP - General (Internal Medicine) Charolette Forward, MD as Consulting Physician (Cardiology) Telford Nab, RN as Registered Nurse [name], MD as Radiation Oncologist (Radiation Oncology)   REASON FOR VISIT:  Follow-up for small cell lung cancer,   HISTORY OF PRESENTING ILLNESS:  Kristi [name] is a  75 y.o.  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  10/20/2018 CT chest with contrast showed large mediastinal mass involving both hilar, left greater than right, consistent with lung carcinoma, The mass causes narrowing of the left mainstem bronchus with resultant volume loss on the left and a mediastinal shift to the left.  Moderate size left pleural effusion. 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  # Initial MRI brain negative.  # Nov 2019- Jan 2020 s/p 4 cycles of Carbo/Etoposide/Tecentriq #  01/24/2019 interim CT scan done which showed continued positive response to therapy with continued reduction in mediastinal adenopathy.  No residual measurable left lung mass. CT findings of acute emphysematous cystitis. Urology did not feel that patient has pyelonephritis and recommend treatment with antibiotics because patient's immunocompromise. Patient finished treatment.   # 11/02/2018-01/09/2019 Chemotherapy carboplatin + Etoposide + tecentriq # Consolidation chest radiation and whole brain radiation finished in May 2020 # 04/25/2019 resume Tecentriq every 3 weeks. # Patient had a fall from her stairs on 01/19/2020. In the emergency room she had CT chest abdomen pelvis CT, CT head without contrast, CT cervical spine without contrast. No CT evidence for acute intracranial abnormality, degenerative  changes of cervical spine..  No CT evidence of acute thoracic abdomen injury.  Severe compression fracture of T7, subacute.  # kyphoplasty procedure on 03/01/2020.  T7 biopsy negative for cancer.  11/20/2020 Surveillance CT scan  Reduced size and prominence of right lateral lung base subpleural nodular density. No findings of active malignancy.  MRI brain showed stable post treatment brain. No metastatic disease.    INTERVAL HISTORY Kristi [name] is a 75 y.o. female who has above history reviewed by me today presents for evaluation prior to immunotherapy for treatment of extensive small cell lung cancer. Patient is on immunotherapy maintenance. She reports feeling tired.No new complaints. She had a nice trip in [place]. .  Review of Systems  Constitutional:  Positive for fatigue. Negative for appetite change, chills and fever.  HENT:   Negative for hearing loss and voice change.   Eyes:  Negative for eye problems.  Respiratory:  Negative for chest tightness, cough and shortness of breath.   Cardiovascular:  Negative for chest pain.  Gastrointestinal:  Negative for abdominal distention, abdominal pain, blood in stool, diarrhea and nausea.  Endocrine: Negative for hot flashes.  Genitourinary:  Negative for difficulty urinating and frequency.   Musculoskeletal:  Negative for arthralgias and back pain.  Skin:  Negative for itching and rash.  Neurological:  Negative for extremity weakness and headaches.  Hematological:  Negative for adenopathy.  Psychiatric/Behavioral:  Negative for confusion and depression. The patient is not nervous/anxious.        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[place]) 09/30/2014   1 stent   Claustrophobia    Coronary artery disease    GERD (gastroesophageal reflux disease)    Hypercholesteremia  MI, old    Pneumonia    Small cell lung cancer ([place])    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 ([place])  10/27/2018    SURGICAL HISTORY: Past Surgical History:  Procedure Laterality Date   APPENDECTOMY     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   BLADDER NECK SUSPENSION     CHOLECYSTECTOMY N/A 08/14/2018   Procedure: LAPAROSCOPIC CHOLECYSTECTOMY;  Surgeon: Jules Husbands, MD;  Location: [facility] ORS;  Service: General;  Laterality: N/A;   CHOLECYSTECTOMY  11/2018   CORONARY ANGIOPLASTY  09/29/2014   CORONARY STENT PLACEMENT     ENDOBRONCHIAL ULTRASOUND N/A 10/21/2018   Procedure: ENDOBRONCHIAL ULTRASOUND;  Surgeon: Laverle Hobby, MD;  Location: [facility] ORS;  Service: Pulmonary;  Laterality: N/A;   HERNIA REPAIR  2012   IR FLUORO GUIDE CV LINE RIGHT  12/19/2018   KYPHOPLASTY N/A 03/01/2020   Procedure: T7 KYPHOPLASTY;  Surgeon: Hessie Knows, MD;  Location: [facility] ORS;  Service: Orthopedics;  Laterality: N/A;   LAPAROTOMY     LEFT HEART CATHETERIZATION WITH CORONARY ANGIOGRAM N/A 09/29/2014   Procedure: LEFT HEART CATHETERIZATION WITH CORONARY ANGIOGRAM;  Surgeon: Clent Demark, MD;  Location: [place] CATH LAB;  Service: Cardiovascular;  Laterality: N/A;   PORTA CATH INSERTION N/A 01/02/2019   Procedure: PORTA CATH INSERTION;  Surgeon: Algernon Huxley, MD;  Location: [place] CV LAB;  Service: Cardiovascular;  Laterality: N/A;   PORTACATH PLACEMENT Right 10/28/2018   Procedure: INSERTION PORT-A-CATH;  Surgeon: Jules Husbands, MD;  Location: [facility] ORS;  Service: General;  Laterality: Right;   XI ROBOTIC ASSISTED VENTRAL HERNIA N/A 02/27/2021   Procedure: XI ROBOTIC ASSISTED VENTRAL HERNIA (incisional) WITH MESH;  Surgeon: Olean Ree, MD;  Location: [facility] ORS;  Service: General;  Laterality: N/A;    SOCIAL HISTORY: Social History   Socioeconomic History   Marital status: Married    Spouse name: [name]    Number of children: 3   Years of education: Not on file   Highest education level: Not on file  Occupational History   Occupation: Retired    Comment: Chief Technology Officer   Tobacco Use   Smoking status:  Former    Packs/day: 1.00    Years: 39.00    Pack years: 39.00    Types: Cigarettes    Start date: 12/21/1978    Quit date: 10/16/2018    Years since quitting: 2.6   Smokeless tobacco: Never  Vaping Use   Vaping Use: Never used  Substance and Sexual Activity   Alcohol use: Yes    Alcohol/week: 0.0 standard drinks    Comment: occassional - approx 1 every 2 weeks    Drug use: No   Sexual activity: Yes    Birth control/protection: None  Other Topics Concern   Not on file  Social History Narrative   Not on file   Social Determinants of Health   Financial Resource Strain: Not on file  Food Insecurity: Not on file  Transportation Needs: Not on file  Physical Activity: Not on file  Stress: Not on file  Social Connections: Not on file  Intimate Partner Violence: Not on file    FAMILY HISTORY: Family History  Problem Relation Age of Onset   Alzheimer's disease Mother    Colon cancer Father    Breast cancer Neg Hx     ALLERGIES:  has No Known Allergies.  MEDICATIONS:  Current Outpatient Medications  Medication Sig Dispense Refill   acetaminophen (TYLENOL) 500 MG tablet Take 2 tablets (1,000  mg total) by mouth every 6 (six) hours as needed for mild pain or headache.     aspirin EC 81 MG tablet Take 1 tablet (81 mg total) by mouth daily. Swallow whole. 30 tablet 0   atorvastatin (LIPITOR) 10 MG tablet Take 1 tablet (10 mg total) by mouth daily. 90 tablet 3   Calcium 600-200 MG-UNIT tablet Take 1 tablet by mouth 2 (two) times daily.     Cholecalciferol (DIALYVITE VITAMIN D 5000) 125 MCG (5000 UT) capsule Take 5,000 Units by mouth daily.     lidocaine-prilocaine (EMLA) cream Apply 1 application topically daily as needed (port access). 30 g 2   Magnesium 250 MG TABS Take 250 mg by mouth daily.     oxyCODONE (ROXICODONE) 5 MG immediate release tablet Take 1 tablet (5 mg total) by mouth every 6 (six) hours as needed for severe pain. 30 tablet 0   pantoprazole (PROTONIX) 40 MG  tablet Take 1 tablet (40 mg total) by mouth 2 (two) times daily. 60 tablet 0   prochlorperazine (COMPAZINE) 10 MG tablet Take 10 mg by mouth every 6 (six) hours as needed for nausea or vomiting.     sertraline (ZOLOFT) 100 MG tablet Take 1 tablet (100 mg total) by mouth daily. 90 tablet 3   sucralfate (CARAFATE) 1 g tablet Take 1 g by mouth 3 (three) times daily as needed (heartburn).     traZODone (DESYREL) 50 MG tablet Take 1 tablet (50 mg total) by mouth at bedtime as needed for sleep. 30 tablet 2   vitamin B-12 (CYANOCOBALAMIN) 1000 MCG tablet Take 1,000 mcg by mouth daily.     No current facility-administered medications for this visit.   Facility-Administered Medications Ordered in Other Visits  Medication Dose Route Frequency Provider Last Rate Last Admin   heparin lock flush 100 unit/mL  500 Units Intravenous Once Earlie Server, MD       heparin lock flush 100 unit/mL  500 Units Intravenous Once Earlie Server, MD       sodium chloride flush (NS) 0.9 % injection 10 mL  10 mL Intravenous PRN Earlie Server, MD   10 mL at 01/09/19 0820   sodium chloride flush (NS) 0.9 % injection 10 mL  10 mL Intravenous PRN Earlie Server, MD   10 mL at 01/10/19 1400     PHYSICAL EXAMINATION: ECOG PERFORMANCE STATUS: 1 - Symptomatic but completely ambulatory Vitals:   06/24/21 1032 06/24/21 1037  BP: 106/82   Pulse: 88   Resp:  17  Temp: 98.1 F (36.7 C)   SpO2: 98%    Filed Weights   06/24/21 1032  Weight: 141 lb (64 kg)   Physical Examination Today's Vitals   06/24/21 1032 06/24/21 1037  BP: 106/82   Pulse: 88   Resp:  17  Temp: 98.1 F (36.7 C)   TempSrc: Tympanic   SpO2: 98%   Weight: 141 lb (64 kg)   PainSc: 0-No pain    Body mass index is 22.08 kg/m.   Physical Exam Constitutional:      General: She is not in acute distress.    Appearance: She is not diaphoretic.     Comments: Patient sits in the wheelchair.  HENT:     Head: Normocephalic and atraumatic.     Nose: Nose normal.      Mouth/Throat:     Pharynx: No oropharyngeal exudate.  Eyes:     General: No scleral icterus.    Pupils: Pupils are equal, round,  and reactive to light.  Cardiovascular:     Rate and Rhythm: Normal rate and regular rhythm.     Heart sounds: No murmur heard. Pulmonary:     Effort: Pulmonary effort is normal. No respiratory distress.     Breath sounds: No wheezing or rales.     Comments: Decreased breath sounds bilaterally.  Abdominal:     General: There is no distension.     Palpations: Abdomen is soft.     Tenderness: There is no abdominal tenderness.  Musculoskeletal:        General: Normal range of motion.     Cervical back: Normal range of motion and neck supple.  Skin:    General: Skin is warm and dry.     Findings: No erythema.  Neurological:     Mental Status: She is alert and oriented to person, place, and time.     Cranial Nerves: No cranial nerve deficit.     Motor: No abnormal muscle tone.     Coordination: Coordination normal.  Psychiatric:        Mood and Affect: Affect normal.       LABORATORY DATA:  I have reviewed the data as listed Lab Results  Component Value Date   WBC 7.3 06/24/2021   HGB 11.6 (L) 06/24/2021   HCT 35.2 (L) 06/24/2021   MCV 90.7 06/24/2021   PLT 239 06/24/2021   Recent Labs    08/12/20 1315 09/02/20 1358 09/23/20 0845 10/14/20 0839 05/12/21 0834 06/02/21 0910 06/24/21 1001  NA 134* 137 138   < > 138 135 134*  K 4.0 3.5 4.0   < > 3.8 3.8 3.8  CL 104 105 106   < > 103 100 100  CO2 23 24 24    < > 25 25 25   GLUCOSE 98 103* 91   < > 100* 103* 94  BUN 13 18 14    < > 13 16 11   CREATININE 0.81 0.68 0.75   < > 0.78 0.81 0.71  CALCIUM 8.8* 8.3* 8.6*   < > 9.5 9.7 9.7  GFRNONAA >60 >60 >60   < > >60 >60 >60  GFRAA >60 >60 >60  --   --   --   --   PROT 7.4 6.8 7.0   < > 6.9 7.3 7.5  ALBUMIN 4.0 3.7 3.8   < > 4.1 4.1 4.0  AST 17 14* 17   < > 16 16 24   ALT 12 11 11    < > 12 12 13   ALKPHOS 49 49 47   < > 42 49 58  BILITOT 0.5 0.5  0.6   < > 0.5 0.6 0.7   < > = values in this interval not displayed.     RADIOGRAPHIC STUDIES: I have personally reviewed the radiological images as listed and agreed with the findings in the report.  MR Brain W Wo Contrast  Result Date: 05/20/2021 CLINICAL DATA:  75 year old female with small cell lung cancer status post prophylactic whole brain radiation in June 2020. Restaging. EXAM: MRI HEAD WITHOUT AND WITH CONTRAST TECHNIQUE: Multiplanar, multiecho pulse sequences of the brain and surrounding structures were obtained without and with intravenous contrast. CONTRAST:  60mL GADAVIST GADOBUTROL 1 MMOL/ML IV SOLN COMPARISON:  11/20/2020 brain MRI and earlier. FINDINGS: Brain: There are 2 small foci of white matter restricted diffusion in the anterior right frontal lobe. The larger is near the frontal horn on series 3, image 30 (series 4, image 30) and the  smaller in the anterior subcortical white matter or centrum semi of bowel on image 37 (series 4, image 37). Neither is associated with abnormal enhancement, hemorrhage, or mass effect. Questionable small linear diffusion abnormality also in the right cerebellum on series 3, image 13, although no obvious ADC correlate. No associated enhancement. No other restricted diffusion. No abnormal enhancement identified. No dural thickening. No midline shift, mass effect, evidence of mass lesion, ventriculomegaly, extra-axial collection or acute intracranial hemorrhage. Cervicomedullary junction and pituitary are within normal limits. Stable cerebral volume since last year. Advanced bilateral white matter T2 and FLAIR hyperintensity redemonstrated, including involvement of the bilateral deep white matter capsules, and likely XRT related. Similar patchy T2 heterogeneity in the deep gray matter nuclei and brainstem. No chronic cerebral blood products. Vascular: Major intracranial vascular flow voids are stable. Skull and upper cervical spine: Negative visible cervical  spine and spinal cord. Visualized bone marrow signal is within normal limits. Sinuses/Orbits: Stable, negative. Other: Trace mastoid air cell fluid is stable. Negative visible scalp soft tissues. IMPRESSION: 1. Two small foci of restricted diffusion in the right frontal lobe white matter do not enhance and are most compatible with acute small vessel ischemia. Questionable third small focus in the right cerebellum also. 2. Otherwise stable signal changes of whole brain radiation. No metastatic disease identified. Electronically Signed   By: Genevie Ann M.D.   On: 05/20/2021 12:08   CT CHEST ABDOMEN PELVIS W CONTRAST  Result Date: 06/17/2021 CLINICAL DATA:  Small cell lung cancer restaging EXAM: CT CHEST, ABDOMEN, AND PELVIS WITH CONTRAST TECHNIQUE: Multidetector CT imaging of the chest, abdomen and pelvis was performed following the standard protocol during bolus administration of intravenous contrast. CONTRAST:  124mL OMNIPAQUE IOHEXOL 300 MG/ML  SOLN COMPARISON:  Multiple exams, including 02/26/2021 FINDINGS: CT CHEST FINDINGS Cardiovascular: Port-A-Cath tip: Cavoatrial junction. Coronary, aortic arch, and branch vessel atherosclerotic vascular disease. Collateralization of right upper extremity venous structures suggesting possible stenosis in the vicinity of the right subclavian vein. Mediastinum/Nodes: Contrast medium in the esophagus suggesting dysmotility or reflux. Roughly stable indistinctness of tissue planes around the midthoracic esophagus and subcarinal lesion, likely therapy related. Lungs/Pleura: Centrilobular emphysema. Old granulomatous disease. Stable peripheral bandlike scarring in the right lower lobe. Stable scarring anteriorly in the left lower lobe. Posterior subpleural density in the left lower lobe is stable and likely from scarring. Musculoskeletal: Stable chronic upper sternal body fracture with sclerosis along the fracture margins. Stable T7 compression with vertebral augmentation. Stable  inferior endplate compression fracture at T4. CT ABDOMEN PELVIS FINDINGS Hepatobiliary: Unremarkable Pancreas: Unremarkable Spleen: Punctate calcification from old granulomatous disease. Adrenals/Urinary Tract: At least partially duplicated right renal collecting system. Adrenal glands unremarkable. Bladder cystocele extends 2.6 cm below the pubococcygeal line. Stomach/Bowel: Scattered colonic diverticula. Vascular/Lymphatic: Aortoiliac atherosclerotic vascular disease. No pathologic adenopathy identified. Reproductive: Unremarkable Other: No supplemental non-categorized findings. Musculoskeletal: Upper abdominal ventral hernia mesh. Pelvic floor laxity. Interval repair of umbilical hernia. IMPRESSION: 1. No findings of active or recurrent malignancy. Continued indistinctness of tissue planes in the subcarinal/mid esophageal region. 2. Contrast medium in the esophagus suggesting dysmotility or reflux. 3. Other imaging findings of potential clinical significance: Interval repair of prior umbilical hernia. Collateralized right upper extremity venous structures suggesting possible right subclavian vein stenosis. Aortic Atherosclerosis (ICD10-I70.0) and Emphysema (ICD10-J43.9). Coronary atherosclerosis. Old granulomatous disease. At least partially duplicated right renal collecting system. Scattered colonic diverticula. Upper abdominal ventral hernia mesh. Pelvic floor laxity with cystocele. Scattered scarring in the lower lobes. Electronically Signed   By:  Van Clines M.D.   On: 06/17/2021 08:32      ASSESSMENT & PLAN:  1. Small cell lung cancer ([place])   2. Encounter for antineoplastic immunotherapy   3. Osteopenia, unspecified location   4. Gastroesophageal reflux disease, unspecified whether esophagitis present    #Extensive small cell lung cancer, S/p 4 cycles of Carboplatin, Etoposide and Tecentriq.   Currently on Tecentriq maintenance. Labs are reviewed and discussed with patient Proceed with  Tecentriq maintenance today. She is doing very well clinically. Recent MRI brain and CT showed no evidence of disease recurrence  #Fatigue, normal thyroid function. # History bone metastasis, #Osteopenia  zometa every 4-6 weeks.  continue calcium and vitamin D.     #GERD, on omeprazole, stable symptoms.  Continue Protonix # COPD/emphysema, Stable. Monitor.  Follow up in 3 weeks for next cycle of Tecentriq and Zometa.  Earlie Server, MD, PhD

## 2021-07-03 ENCOUNTER — Other Ambulatory Visit: Payer: Self-pay

## 2021-07-03 ENCOUNTER — Ambulatory Visit: Payer: Medicare Other | Attending: Internal Medicine

## 2021-07-03 DIAGNOSIS — R2681 Unsteadiness on feet: Secondary | ICD-10-CM | POA: Insufficient documentation

## 2021-07-03 DIAGNOSIS — Z9181 History of falling: Secondary | ICD-10-CM | POA: Insufficient documentation

## 2021-07-03 DIAGNOSIS — M6281 Muscle weakness (generalized): Secondary | ICD-10-CM | POA: Diagnosis not present

## 2021-07-03 NOTE — Therapy (Signed)
[facility] [address] [place], [place], [address] Phone: [phone]   Fax:  [phone]  Physical Therapy Evaluation  Patient Details  Name: Kristi [name] MRN: [id number] Date of Birth: [birth year] Referring Provider (PT): Dr. Army Melia   Encounter Date: 07/03/2021   PT End of Session - 07/03/21 1143     Visit Number 1    Number of Visits 25    Date for PT Re-Evaluation 09/25/21    Authorization Type eval: 07/03/21    PT Start Time 4818    PT Stop Time 1230    PT Time Calculation (min) 45 min    Equipment Utilized During Treatment Gait belt    Activity Tolerance Patient tolerated treatment well    Behavior During Therapy [facility] for tasks assessed/performed             Past Medical History:  Diagnosis Date   Acute MI, inferoposterior wall ([place]) 09/30/2014   1 stent   Claustrophobia    Coronary artery disease    GERD (gastroesophageal reflux disease)    Hypercholesteremia    MI, old    Pneumonia    Small cell lung cancer ([place])    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 ([place]) 10/27/2018    Past Surgical History:  Procedure Laterality Date   APPENDECTOMY     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   BLADDER NECK SUSPENSION     CHOLECYSTECTOMY N/A 08/14/2018   Procedure: LAPAROSCOPIC CHOLECYSTECTOMY;  Surgeon: Jules Husbands, MD;  Location: [facility] ORS;  Service: General;  Laterality: N/A;   CHOLECYSTECTOMY  11/2018   CORONARY ANGIOPLASTY  09/29/2014   CORONARY STENT PLACEMENT     ENDOBRONCHIAL ULTRASOUND N/A 10/21/2018   Procedure: ENDOBRONCHIAL ULTRASOUND;  Surgeon: Laverle Hobby, MD;  Location: [facility] ORS;  Service: Pulmonary;  Laterality: N/A;   HERNIA REPAIR  2012   IR FLUORO GUIDE CV LINE RIGHT  12/19/2018   KYPHOPLASTY N/A 03/01/2020   Procedure: T7 KYPHOPLASTY;  Surgeon: Hessie Knows, MD;  Location: [facility] ORS;  Service: Orthopedics;  Laterality: N/A;   LAPAROTOMY     LEFT HEART CATHETERIZATION WITH CORONARY ANGIOGRAM N/A  09/29/2014   Procedure: LEFT HEART CATHETERIZATION WITH CORONARY ANGIOGRAM;  Surgeon: Clent Demark, MD;  Location: [place] CATH LAB;  Service: Cardiovascular;  Laterality: N/A;   PORTA CATH INSERTION N/A 01/02/2019   Procedure: PORTA CATH INSERTION;  Surgeon: Algernon Huxley, MD;  Location: [place] CV LAB;  Service: Cardiovascular;  Laterality: N/A;   PORTACATH PLACEMENT Right 10/28/2018   Procedure: INSERTION PORT-A-CATH;  Surgeon: Jules Husbands, MD;  Location: [facility] ORS;  Service: General;  Laterality: Right;   XI ROBOTIC ASSISTED VENTRAL HERNIA N/A 02/27/2021   Procedure: XI ROBOTIC ASSISTED VENTRAL HERNIA (incisional) WITH MESH;  Surgeon: Olean Ree, MD;  Location: [facility] ORS;  Service: General;  Laterality: N/A;    There were no vitals filed for this visit.    Subjective Assessment - 07/03/21 1140     Subjective Unsteadiness/falls    Pertinent History Pt reports a history of difficulty with her balance since the start of her treatment for her lung cancer. "I'm afraid of falling." She reports 4-5 falls in the last 6 months and during one fall she injured her L shoulder (skin avulsion). She has a rollator but doesn't use it often. She states that she is more fearful of walking when she uses the rollator. She gets and infusion for her lung cancer every 3 weeks. She  has a history of compression fractures in her spine and states that she has had what sounds like a kyphoplasty but still has considerable back pain. She is not involved in any kind of regular exercise program.    Limitations Walking    Patient Stated Goals Decrease fear of falling    Currently in Pain? --   Unrelated to current episode               [facility] PT Assessment - 07/03/21 1141       Assessment   Medical Diagnosis Gait disturbance    Referring Provider (PT) Dr. Army Melia    Onset Date/Surgical Date 10/27/18   Approximate   Hand Dominance Right    Next MD Visit Not reported    Prior Therapy None for this issue       Precautions   Precautions Fall      Restrictions   Weight Bearing Restrictions No      Balance Screen   Has the patient fallen in the past 6 months Yes      Standardized Balance Assessment   Standardized Balance Assessment Berg Balance Test      Berg Balance Test   Sit to Stand Able to stand  independently using hands    Standing Unsupported Able to stand safely 2 minutes    Sitting with Back Unsupported but Feet Supported on Floor or Stool Able to sit safely and securely 2 minutes    Stand to Sit Controls descent by using hands    Transfers Able to transfer safely, definite need of hands    Standing Unsupported with Eyes Closed Able to stand 10 seconds safely    Standing Unsupported with Feet Together Able to place feet together independently and stand 1 minute safely    From Standing, Reach Forward with Outstretched Arm Can reach forward >5 cm safely (2")    From Standing Position, Pick up Object from Floor Able to pick up shoe, needs supervision    From Standing Position, Turn to Look Behind Over each Shoulder Turn sideways only but maintains balance    Turn 360 Degrees Able to turn 360 degrees safely but slowly    Standing Unsupported, Alternately Place Feet on Step/Stool Able to stand independently and safely and complete 8 steps in 20 seconds    Standing Unsupported, One Foot in Front Able to take small step independently and hold 30 seconds    Standing on One Leg Tries to lift leg/unable to hold 3 seconds but remains standing independently    Total Score 41                  SUBJECTIVE Chief complaint: Onset: Pt reports a history of difficulty with her balance since the start of her treatment for her lung cancer. "I'm afraid of falling." She reports 4-5 falls in the last 6 months and during one fall she injured her L shoulder (skin avulsion). She has a rollator but doesn't use it often. She states that she is more fearful of walking when she uses the rollator.  She gets and infusion for her lung cancer every 3 weeks. She has a history of compression fractures in her spine and states that she has had what sounds like a kyphoplasty but still has considerable back pain. She is not involved in any kind of regular exercise program.  Recent changes in medication: "I started calcium a couple weeks ago."  Directional pattern for falls: Forward Prior history of physical therapy  for balance: None Follow-up appointment with MD: Not reported Red flags (bowel/bladder changes, saddle paresthesia, personal history of cancer, chills/fever, night sweats, unrelenting pain) Negative   OBJECTIVE  MUSCULOSKELETAL: Tremor: Pt reports bilateral UE tremors, very mild during evaluation today.  Bulk: Normal Tone: Normal, no clonus  Posture Forward head and rounded shoulders  Gait Pt ambulates with decreased speed, decreased step length, mild shuffle, and forward trunk lean.   Strength R/L 4+/4+ Shoulder flexion 4+/4+ Shoulder abduction 5/5 Elbow flexion 5/5 Elbow extension 4/4 Hip flexion 4-/4- Hip abduction 4-/4- Hip adduction 5/5 Knee extension 4/4 Knee flexion 4+/4+ Ankle Dorsiflexion Grip strength: Diminished bilaterally, not formally measured  NEUROLOGICAL:  Mental Status Patient is oriented to person, place and time.  History of memory issues,  Attention span appears somewhat limit, she is tangential during history; Expressive speech is intact.  Patient's fund of knowledge is within normal limits for educational level.  Cranial Nerves Deferred  Sensation Grossly intact to light touch bilateral UEs/LEs as determined by testing dermatomes C2-T2/L2-S2 respectively Proprioception and hot/cold testing deferred on this date  Reflexes Deferred  Coordination/Cerebellar Deferred   FUNCTIONAL OUTCOME MEASURES   Results Comments  BERG 41/56 Fall risk, in need of intervention  TUG 13.6 seconds Within acceptable limits  5TSTS Unable to stand  without UE support Fall risk, in need of intervention  2 Minute Walk Test NT   10 Meter Gait Speed Self-selected: 17.5s = 0.24m/s; Fastest: 14.3s = 0.70 m/s Below normative values for full community ambulation  ABC Scale 41.25% Low balance confidence  FOTO 39 Predicted improvement to 51    POSTURAL CONTROL TESTS   Modified Clinical Test of Sensory Interaction for Balance    (CTSIB):  CONDITION TIME SWAY  Eyes open, firm surface 30 seconds 1+  Eyes closed, firm surface 30 seconds 2+  Eyes open, foam surface 17 seconds 4+  Eyes closed, foam surface 3 seconds 4+          Objective measurements completed on examination: See above findings.                 PT Short Term Goals - 07/03/21 1223       PT SHORT TERM GOAL #1   Title Pt will be independent with HEP in order to improve strength and balance in order to decrease fall risk and improve function at home and work.    Time 6    Period Weeks    Status New    Target Date 08/14/21               PT Long Term Goals - 07/03/21 1224       PT LONG TERM GOAL #1   Title Pt will improve BERG by at least 3 points in order to demonstrate clinically significant improvement in balance.    Baseline 07/03/21: 41/56    Time 12    Period Weeks    Status New    Target Date 09/25/21      PT LONG TERM GOAL #2   Title Pt will improve FOTO to at least 51 in order to demonstrate clinically significant improvement in balance and decreased risk for falls.    Baseline 07/03/21: 39    Time 12    Period Weeks    Status New    Target Date 09/25/21      PT LONG TERM GOAL #3   Title Pt will be able to perform sit to stand without UE support in order to  demonstrate clinically significant improvement in leg strength and decreased risk for falls    Baseline 07/03/21: unable to perform    Time 12    Period Weeks    Status New    Target Date 09/25/21      PT LONG TERM GOAL #4   Title Pt will improve ABC by at least 13% in  order to demonstrate clinically significant improvement in balance confidence.    Baseline 07/03/21: 41.25%    Time 12    Period Weeks    Status New    Target Date 09/25/21                    Plan - 07/03/21 1143     Clinical Impression Statement Pt is a pleasant 75 year-old female referred for difficulty with balance. PT examination reveals deficits in LE strength and balance as evidence by abnormal BERG, 12m gait speed, mCTSIB, ABC scale score, and sit to stand test. Pt presents with deficits in strength, gait and balance and will benefit from skilled PT services to address these deficits and decrease her risk for future falls.    Personal Factors and Comorbidities Age;Comorbidity 3+    Comorbidities Lung cancer, memory deficit, CAD, MI    Examination-Activity Limitations Locomotion Level;Stairs    Examination-Participation Restrictions Community Activity;Laundry;Meal Prep;Shop    Stability/Clinical Decision Making Evolving/Moderate complexity    Clinical Decision Making Moderate    Rehab Potential Fair    PT Frequency 2x / week    PT Duration 12 weeks    PT Treatment/Interventions ADLs/Self Care Home Management;Aquatic Therapy;Biofeedback;Canalith Repostioning;Cryotherapy;Electrical Stimulation;Iontophoresis 4mg /ml Dexamethasone;Moist Heat;Traction;Ultrasound;DME Instruction;Gait training;Stair training;Functional mobility training;Therapeutic activities;Therapeutic exercise;Balance training;Neuromuscular re-education;Patient/family education;Cognitive remediation;Manual techniques;Passive range of motion;Energy conservation;Dry needling;Vestibular;Spinal Manipulations;Joint Manipulations    PT Next Visit Plan Initiate strengthening and balance activities, prescribe HEP    PT Home Exercise Plan None currently             Patient will benefit from skilled therapeutic intervention in order to improve the following deficits and impairments:  Decreased balance, Decreased  strength, Abnormal gait  Visit Diagnosis: Unsteadiness on feet  History of falling  Muscle weakness (generalized)     Problem List Patient Active Problem List   Diagnosis Date Noted   MCI (mild cognitive impairment) 06/19/2021   Aortic atherosclerosis ([place]) 06/18/2021   Incisional hernia, without obstruction or gangrene    Thrombocytopenia ([place]) 02/12/2021   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 12/05/2020   Diarrhea 12/05/2020   Osteopenia 08/12/2020   Ventral hernia without obstruction or gangrene 08/12/2020   Thoracic compression fracture, closed, initial encounter ([place]) 01/22/2020   Frequent falls 01/22/2020   Encounter for antineoplastic immunotherapy 09/14/2019   Memory loss 09/14/2019   Other fatigue 09/14/2019   Cancer of hilus of left lung ([place]) 06/08/2019   Dehydration 01/30/2019   Acute pyelonephritis 01/24/2019   Anemia due to antineoplastic chemotherapy 01/09/2019   Encounter for antineoplastic chemotherapy 11/02/2018   Cough 10/28/2018   Dysphagia 10/28/2018   Decrease in appetite 10/28/2018   Goals of care, counseling/discussion 10/28/2018   Small cell lung cancer ([place]) 10/27/2018   Grade I diastolic dysfunction 26/94/8546   Localized edema 08/29/2018   Cholecystitis 08/13/2018   Osteopenia determined by x-ray 05/19/2018   Prediabetes 02/08/2018   Hx of fracture of radius 08/18/2016   Vitamin D deficiency 01/07/2016   Hyperlipidemia 01/02/2016   Hypertension 01/02/2016   Chronic pain 01/02/2016   Obesity (BMI 30.0-34.9) 01/02/2016   Coronary artery disease  with stable angina pectoris ([place]) 01/02/2016   IBS (irritable bowel syndrome) 01/02/2016   FH: colon cancer 01/02/2016   DDD (degenerative disc disease), cervical 01/02/2016   Primary osteoarthritis of left knee 01/02/2016   Tobacco use disorder 01/02/2016   Lyndel Safe Waniya Hoglund PT, DPT, GCS  Yanci Bachtell 07/03/2021, 1:52 PM  [facility] [address] [place], [place], [address] Phone: [phone]   Fax:  [phone]  Name: CHRISA HASSAN MRN: [id number] Date of Birth: 1946/09/19

## 2021-07-09 ENCOUNTER — Ambulatory Visit: Payer: Medicare Other

## 2021-07-09 ENCOUNTER — Other Ambulatory Visit: Payer: Self-pay

## 2021-07-09 DIAGNOSIS — M6281 Muscle weakness (generalized): Secondary | ICD-10-CM

## 2021-07-09 DIAGNOSIS — Z9181 History of falling: Secondary | ICD-10-CM | POA: Diagnosis not present

## 2021-07-09 DIAGNOSIS — R2681 Unsteadiness on feet: Secondary | ICD-10-CM | POA: Diagnosis not present

## 2021-07-09 NOTE — Therapy (Signed)
[facility] [address] [place], [place], [address] Phone: [phone]   Fax:  [phone]  Physical Therapy Treatment  Patient Details  Name: Kristi [name] MRN: [id number] Date of Birth: [birth year] Referring Provider (PT): Dr. Army Melia   Encounter Date: 07/09/2021   PT End of Session - 07/09/21 0939     Visit Number 2    Number of Visits 25    Date for PT Re-Evaluation 09/25/21    Authorization Type eval: 07/03/21    PT Start Time 0930    PT Stop Time 1015    PT Time Calculation (min) 45 min    Equipment Utilized During Treatment Gait belt    Activity Tolerance Patient tolerated treatment well    Behavior During Therapy [facility] for tasks assessed/performed             Past Medical History:  Diagnosis Date   Acute MI, inferoposterior wall ([place]) 09/30/2014   1 stent   Claustrophobia    Coronary artery disease    GERD (gastroesophageal reflux disease)    Hypercholesteremia    MI, old    Pneumonia    Small cell lung cancer ([place])    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 ([place]) 10/27/2018    Past Surgical History:  Procedure Laterality Date   APPENDECTOMY     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   BLADDER NECK SUSPENSION     CHOLECYSTECTOMY N/A 08/14/2018   Procedure: LAPAROSCOPIC CHOLECYSTECTOMY;  Surgeon: Jules Husbands, MD;  Location: [facility] ORS;  Service: General;  Laterality: N/A;   CHOLECYSTECTOMY  11/2018   CORONARY ANGIOPLASTY  09/29/2014   CORONARY STENT PLACEMENT     ENDOBRONCHIAL ULTRASOUND N/A 10/21/2018   Procedure: ENDOBRONCHIAL ULTRASOUND;  Surgeon: Laverle Hobby, MD;  Location: [facility] ORS;  Service: Pulmonary;  Laterality: N/A;   HERNIA REPAIR  2012   IR FLUORO GUIDE CV LINE RIGHT  12/19/2018   KYPHOPLASTY N/A 03/01/2020   Procedure: T7 KYPHOPLASTY;  Surgeon: Hessie Knows, MD;  Location: [facility] ORS;  Service: Orthopedics;  Laterality: N/A;   LAPAROTOMY     LEFT HEART CATHETERIZATION WITH CORONARY ANGIOGRAM N/A  09/29/2014   Procedure: LEFT HEART CATHETERIZATION WITH CORONARY ANGIOGRAM;  Surgeon: Clent Demark, MD;  Location: [place] CATH LAB;  Service: Cardiovascular;  Laterality: N/A;   PORTA CATH INSERTION N/A 01/02/2019   Procedure: PORTA CATH INSERTION;  Surgeon: Algernon Huxley, MD;  Location: [place] CV LAB;  Service: Cardiovascular;  Laterality: N/A;   PORTACATH PLACEMENT Right 10/28/2018   Procedure: INSERTION PORT-A-CATH;  Surgeon: Jules Husbands, MD;  Location: [facility] ORS;  Service: General;  Laterality: Right;   XI ROBOTIC ASSISTED VENTRAL HERNIA N/A 02/27/2021   Procedure: XI ROBOTIC ASSISTED VENTRAL HERNIA (incisional) WITH MESH;  Surgeon: Olean Ree, MD;  Location: [facility] ORS;  Service: General;  Laterality: N/A;    There were no vitals filed for this visit.   Subjective Assessment - 07/09/21 0932     Subjective Pt reports that she is doing "OK' today upon arrival. She complains of 6/10 thoracic pain near the site of her compression fracture. No specific questions or concerns currently.    Pertinent History Pt reports a history of difficulty with her balance since the start of her treatment for her lung cancer. "I'm afraid of falling." She reports 4-5 falls in the last 6 months and during one fall she injured her L shoulder (skin avulsion). She has a rollator but doesn't use it  often. She states that she is more fearful of walking when she uses the rollator. She gets and infusion for her lung cancer every 3 weeks. She has a history of compression fractures in her spine and states that she has had what sounds like a kyphoplasty but still has considerable back pain. She is not involved in any kind of regular exercise program.    Limitations Walking    Patient Stated Goals Decrease fear of falling                  TREATMENT   Ther-ex  Sit to stand from regular height chair with Airex pad on seat without UE support 2 x 10; Seated LAQ with 2# ankle weights 2 x 1 minute;;  Standing  hip strengthening with 2# ankle weights 2 x 1 minute each: Hip flexion marches HS curls Hip abduction Hip extension  Mini squats x 10;  Seated clams with green tband resistance 2 x 10; Seated adductor ball squeezes with 3s hold 2 x 10;   Pt educated throughout session about proper posture and technique with exercises. Improved exercise technique, movement at target joints, use of target muscles after min to mod verbal, visual, tactile cues.    Pt demonstrates excellent motivation during session today. Initiated strengthening with patient while providing frequent rest breaks secondary to fatigue. No balance exercises performed today but will initiate at follow-up session.  We will also issue home exercise program at that time.  Patient encouraged to follow-up as scheduled. Pt will benefit from PT services to address deficits in strength, balance, and mobility in order to return to full function at home with decreased risk for falls.                            PT Short Term Goals - 07/03/21 1223       PT SHORT TERM GOAL #1   Title Pt will be independent with HEP in order to improve strength and balance in order to decrease fall risk and improve function at home and work.    Time 6    Period Weeks    Status New    Target Date 08/14/21               PT Long Term Goals - 07/03/21 1224       PT LONG TERM GOAL #1   Title Pt will improve BERG by at least 3 points in order to demonstrate clinically significant improvement in balance.    Baseline 07/03/21: 41/56    Time 12    Period Weeks    Status New    Target Date 09/25/21      PT LONG TERM GOAL #2   Title Pt will improve FOTO to at least 51 in order to demonstrate clinically significant improvement in balance and decreased risk for falls.    Baseline 07/03/21: 39    Time 12    Period Weeks    Status New    Target Date 09/25/21      PT LONG TERM GOAL #3   Title Pt will be able to perform sit to  stand without UE support in order to demonstrate clinically significant improvement in leg strength and decreased risk for falls    Baseline 07/03/21: unable to perform    Time 12    Period Weeks    Status New    Target Date 09/25/21  PT LONG TERM GOAL #4   Title Pt will improve ABC by at least 13% in order to demonstrate clinically significant improvement in balance confidence.    Baseline 07/03/21: 41.25%    Time 12    Period Weeks    Status New    Target Date 09/25/21                   Plan - 07/09/21 0939     Clinical Impression Statement Pt demonstrates excellent motivation during session today. Initiated strengthening with patient while providing frequent rest breaks secondary to fatigue. No balance exercises performed today but will initiate at follow-up session.  We will also issue home exercise program at that time.  Patient encouraged to follow-up as scheduled. Pt will benefit from PT services to address deficits in strength, balance, and mobility in order to return to full function at home with decreased risk for falls.    Personal Factors and Comorbidities Age;Comorbidity 3+    Comorbidities Lung cancer, memory deficit, CAD, MI    Examination-Activity Limitations Locomotion Level;Stairs    Examination-Participation Restrictions Community Activity;Laundry;Meal Prep;Shop    Stability/Clinical Decision Making Evolving/Moderate complexity    Rehab Potential Fair    PT Frequency 2x / week    PT Duration 12 weeks    PT Treatment/Interventions ADLs/Self Care Home Management;Aquatic Therapy;Biofeedback;Canalith Repostioning;Cryotherapy;Electrical Stimulation;Iontophoresis 4mg /ml Dexamethasone;Moist Heat;Traction;Ultrasound;DME Instruction;Gait training;Stair training;Functional mobility training;Therapeutic activities;Therapeutic exercise;Balance training;Neuromuscular re-education;Patient/family education;Cognitive remediation;Manual techniques;Passive range of  motion;Energy conservation;Dry needling;Vestibular;Spinal Manipulations;Joint Manipulations    PT Next Visit Plan Initiate strengthening and balance activities, prescribe HEP    PT Home Exercise Plan None currently             Patient will benefit from skilled therapeutic intervention in order to improve the following deficits and impairments:  Decreased balance, Decreased strength, Abnormal gait  Visit Diagnosis: Muscle weakness (generalized)     Problem List Patient Active Problem List   Diagnosis Date Noted   MCI (mild cognitive impairment) 06/19/2021   Aortic atherosclerosis ([place]) 06/18/2021   Incisional hernia, without obstruction or gangrene    Thrombocytopenia ([place]) 02/12/2021   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 12/05/2020   Diarrhea 12/05/2020   Osteopenia 08/12/2020   Ventral hernia without obstruction or gangrene 08/12/2020   Thoracic compression fracture, closed, initial encounter ([place]) 01/22/2020   Frequent falls 01/22/2020   Encounter for antineoplastic immunotherapy 09/14/2019   Memory loss 09/14/2019   Other fatigue 09/14/2019   Cancer of hilus of left lung ([place]) 06/08/2019   Dehydration 01/30/2019   Acute pyelonephritis 01/24/2019   Anemia due to antineoplastic chemotherapy 01/09/2019   Encounter for antineoplastic chemotherapy 11/02/2018   Cough 10/28/2018   Dysphagia 10/28/2018   Decrease in appetite 10/28/2018   Goals of care, counseling/discussion 10/28/2018   Small cell lung cancer ([place]) 10/27/2018   Grade I diastolic dysfunction 38/09/1750   Localized edema 08/29/2018   Cholecystitis 08/13/2018   Osteopenia determined by x-ray 05/19/2018   Prediabetes 02/08/2018   Hx of fracture of radius 08/18/2016   Vitamin D deficiency 01/07/2016   Hyperlipidemia 01/02/2016   Hypertension 01/02/2016   Chronic pain 01/02/2016   Obesity (BMI 30.0-34.9) 01/02/2016   Coronary artery disease with stable angina pectoris ([place]) 01/02/2016   IBS (irritable  bowel syndrome) 01/02/2016   FH: colon cancer 01/02/2016   DDD (degenerative disc disease), cervical 01/02/2016   Primary osteoarthritis of left knee 01/02/2016   Tobacco use disorder 01/02/2016   Corene Cornea D Brenna Friesenhahn PT, DPT, GCS  Sander Remedios  07/09/2021, 1:33 PM  [facility] [address] [place], [place], [address] Phone: [phone]   Fax:  [phone]  Name: Kristi [name] MRN: [id number] Date of Birth: [birth year]

## 2021-07-11 ENCOUNTER — Other Ambulatory Visit: Payer: Self-pay

## 2021-07-11 ENCOUNTER — Ambulatory Visit: Payer: Medicare Other

## 2021-07-11 DIAGNOSIS — Z9181 History of falling: Secondary | ICD-10-CM | POA: Diagnosis not present

## 2021-07-11 DIAGNOSIS — M6281 Muscle weakness (generalized): Secondary | ICD-10-CM | POA: Diagnosis not present

## 2021-07-11 DIAGNOSIS — R2681 Unsteadiness on feet: Secondary | ICD-10-CM

## 2021-07-11 NOTE — Therapy (Signed)
[facility] [address] [place], [place], [address] Phone: [phone]   Fax:  [phone]  Physical Therapy Treatment  Patient Details  Name: Kristi [name] MRN: [id number] Date of Birth: 08-17-1946 Referring Provider (PT): Dr. Army Melia   Encounter Date: 07/11/2021   PT End of Session - 07/11/21 1238     Visit Number 3    Number of Visits 25    Date for PT Re-Evaluation 09/25/21    Authorization Type eval: 07/03/21    PT Start Time 0932    PT Stop Time 1230    PT Time Calculation (min) 45 min    Equipment Utilized During Treatment Gait belt    Activity Tolerance Patient tolerated treatment well    Behavior During Therapy [facility] for tasks assessed/performed             Past Medical History:  Diagnosis Date   Acute MI, inferoposterior wall ([place]) 09/30/2014   1 stent   Claustrophobia    Coronary artery disease    GERD (gastroesophageal reflux disease)    Hypercholesteremia    MI, old    Pneumonia    Small cell lung cancer ([place])    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 ([place]) 10/27/2018    Past Surgical History:  Procedure Laterality Date   APPENDECTOMY     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   BLADDER NECK SUSPENSION     CHOLECYSTECTOMY N/A 08/14/2018   Procedure: LAPAROSCOPIC CHOLECYSTECTOMY;  Surgeon: Jules Husbands, MD;  Location: [facility] ORS;  Service: General;  Laterality: N/A;   CHOLECYSTECTOMY  11/2018   CORONARY ANGIOPLASTY  09/29/2014   CORONARY STENT PLACEMENT     ENDOBRONCHIAL ULTRASOUND N/A 10/21/2018   Procedure: ENDOBRONCHIAL ULTRASOUND;  Surgeon: Laverle Hobby, MD;  Location: [facility] ORS;  Service: Pulmonary;  Laterality: N/A;   HERNIA REPAIR  2012   IR FLUORO GUIDE CV LINE RIGHT  12/19/2018   KYPHOPLASTY N/A 03/01/2020   Procedure: T7 KYPHOPLASTY;  Surgeon: Hessie Knows, MD;  Location: [facility] ORS;  Service: Orthopedics;  Laterality: N/A;   LAPAROTOMY     LEFT HEART CATHETERIZATION WITH CORONARY ANGIOGRAM N/A  09/29/2014   Procedure: LEFT HEART CATHETERIZATION WITH CORONARY ANGIOGRAM;  Surgeon: Clent Demark, MD;  Location: [place] CATH LAB;  Service: Cardiovascular;  Laterality: N/A;   PORTA CATH INSERTION N/A 01/02/2019   Procedure: PORTA CATH INSERTION;  Surgeon: Algernon Huxley, MD;  Location: [place] CV LAB;  Service: Cardiovascular;  Laterality: N/A;   PORTACATH PLACEMENT Right 10/28/2018   Procedure: INSERTION PORT-A-CATH;  Surgeon: Jules Husbands, MD;  Location: [facility] ORS;  Service: General;  Laterality: Right;   XI ROBOTIC ASSISTED VENTRAL HERNIA N/A 02/27/2021   Procedure: XI ROBOTIC ASSISTED VENTRAL HERNIA (incisional) WITH MESH;  Surgeon: Olean Ree, MD;  Location: [facility] ORS;  Service: General;  Laterality: N/A;    There were no vitals filed for this visit.   Subjective Assessment - 07/11/21 1207     Subjective Pt presents to PT without fall from last tx. Pt stated 5-6/10 mid back pain. Pt stated that she is feeling tired today.    Pertinent History Pt reports a history of difficulty with her balance since the start of her treatment for her lung cancer. "I'm afraid of falling." She reports 4-5 falls in the last 6 months and during one fall she injured her L shoulder (skin avulsion). She has a rollator but doesn't use it often. She states that she is  more fearful of walking when she uses the rollator. She gets and infusion for her lung cancer every 3 weeks. She has a history of compression fractures in her spine and states that she has had what sounds like a kyphoplasty but still has considerable back pain. She is not involved in any kind of regular exercise program.    Limitations Walking    Patient Stated Goals Decrease fear of falling    Currently in Pain? Yes    Pain Score 6     Pain Location Back    Pain Orientation Mid    Pain Descriptors / Indicators Aching             TREATMENT     Ther-ex  See updated HEP: Sets and reps performed equal to HEP with therapist supervision  to ensure quality of movement to facilitate core/LE to support the back without harm.    Neuromuscular re-ed: // Bars with gait belt, CGA-Min A, verbal/contact cueing for proper LE placement and stepping mechanics:   1 lap (down and back, total of 20 ft).  1)cone taps 1 lap with bilat UE assist, 1 lap with RUE assist only, 1 lap without UE assist. 2) Blue airex pad located ~40ft apart for facilitated non-complainant surface   Standing balance progression: 1) standard stance 1 min eye closed 2) tandem stance eyes closed 3) bilat tandem stance eyes open. Dual tasking facilitated during eyes open to find and describe different objects located in the gym.      Pt educated throughout session about proper posture and technique with exercises. Improved exercise technique, movement at target joints, use of target muscles after min to mod verbal, visual, tactile cues.      Pt tolerated tx very well with additional/education on new HEP program and progressive balance training in // bars with reduction in UE assist and increasing difficulty ground surfaces. Pt continues to display poor safety awareness during gait and STS transfers, constant 5-6/10 thoracic pain, and poor LE strength endurance, and ROM. Pt. will continue to benefit from skilled physical therapy to progress POC to address remaining deficits to facilitate maximum functional capacity for optimal personal health and wellness for ADLs.       PT Education - 07/11/21 1226     Education Details Pt educated on updated HEP to facilitate strengthening in supine for lumbar/thoracic protection.    Person(s) Educated Patient;Spouse    Methods Explanation;Demonstration;Tactile cues;Verbal cues;Handout    Comprehension Verbalized understanding;Returned demonstration              PT Short Term Goals - 07/03/21 1223       PT SHORT TERM GOAL #1   Title Pt will be independent with HEP in order to improve strength and balance in order to  decrease fall risk and improve function at home and work.    Time 6    Period Weeks    Status New    Target Date 08/14/21               PT Long Term Goals - 07/03/21 1224       PT LONG TERM GOAL #1   Title Pt will improve BERG by at least 3 points in order to demonstrate clinically significant improvement in balance.    Baseline 07/03/21: 41/56    Time 12    Period Weeks    Status New    Target Date 09/25/21      PT LONG TERM GOAL #2  Title Pt will improve FOTO to at least 51 in order to demonstrate clinically significant improvement in balance and decreased risk for falls.    Baseline 07/03/21: 39    Time 12    Period Weeks    Status New    Target Date 09/25/21      PT LONG TERM GOAL #3   Title Pt will be able to perform sit to stand without UE support in order to demonstrate clinically significant improvement in leg strength and decreased risk for falls    Baseline 07/03/21: unable to perform    Time 12    Period Weeks    Status New    Target Date 09/25/21      PT LONG TERM GOAL #4   Title Pt will improve ABC by at least 13% in order to demonstrate clinically significant improvement in balance confidence.    Baseline 07/03/21: 41.25%    Time 12    Period Weeks    Status New    Target Date 09/25/21                   Plan - 07/11/21 1240     Clinical Impression Statement Pt tolerated tx very well with additional/education on new HEP program and progressive balance training in // bars with reduction in UE assist and increasing difficulty ground surfaces. Pt continues to display poor safety awareness during gait and STS transfers, constant 5-6/10 thoracic pain, and poor LE strength endurance, and ROM. Pt. will continue to benefit from skilled physical therapy to progress POC to address remaining deficits to facilitate maximum functional capacity for optimal personal health and wellness for ADLs.    Personal Factors and Comorbidities Age;Comorbidity 3+     Comorbidities Lung cancer, memory deficit, CAD, MI    Examination-Activity Limitations Locomotion Level;Stairs    Examination-Participation Restrictions Community Activity;Laundry;Meal Prep;Shop    Stability/Clinical Decision Making Evolving/Moderate complexity    Clinical Decision Making Moderate    Rehab Potential Fair    PT Frequency 2x / week    PT Duration 12 weeks    PT Treatment/Interventions ADLs/Self Care Home Management;Aquatic Therapy;Biofeedback;Canalith Repostioning;Cryotherapy;Electrical Stimulation;Iontophoresis 4mg /ml Dexamethasone;Moist Heat;Traction;Ultrasound;DME Instruction;Gait training;Stair training;Functional mobility training;Therapeutic activities;Therapeutic exercise;Balance training;Neuromuscular re-education;Patient/family education;Cognitive remediation;Manual techniques;Passive range of motion;Energy conservation;Dry needling;Vestibular;Spinal Manipulations;Joint Manipulations    PT Next Visit Plan Initiate strengthening and balance activities, prescribe HEP    PT Home Exercise Plan [id number]             Patient will benefit from skilled therapeutic intervention in order to improve the following deficits and impairments:  Decreased balance, Decreased strength, Abnormal gait  Visit Diagnosis: Muscle weakness (generalized)  History of falling  Unsteadiness on feet     Problem List Patient Active Problem List   Diagnosis Date Noted   MCI (mild cognitive impairment) 06/19/2021   Aortic atherosclerosis ([place]) 06/18/2021   Incisional hernia, without obstruction or gangrene    Thrombocytopenia ([place]) 02/12/2021   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 12/05/2020   Diarrhea 12/05/2020   Osteopenia 08/12/2020   Ventral hernia without obstruction or gangrene 08/12/2020   Thoracic compression fracture, closed, initial encounter ([place]) 01/22/2020   Frequent falls 01/22/2020   Encounter for antineoplastic immunotherapy 09/14/2019   Memory loss 09/14/2019    Other fatigue 09/14/2019   Cancer of hilus of left lung ([place]) 06/08/2019   Dehydration 01/30/2019   Acute pyelonephritis 01/24/2019   Anemia due to antineoplastic chemotherapy 01/09/2019   Encounter for antineoplastic chemotherapy 11/02/2018   Cough  10/28/2018   Dysphagia 10/28/2018   Decrease in appetite 10/28/2018   Goals of care, counseling/discussion 10/28/2018   Small cell lung cancer ([place]) 10/27/2018   Grade I diastolic dysfunction 31/49/7026   Localized edema 08/29/2018   Cholecystitis 08/13/2018   Osteopenia determined by x-ray 05/19/2018   Prediabetes 02/08/2018   Hx of fracture of radius 08/18/2016   Vitamin D deficiency 01/07/2016   Hyperlipidemia 01/02/2016   Hypertension 01/02/2016   Chronic pain 01/02/2016   Obesity (BMI 30.0-34.9) 01/02/2016   Coronary artery disease with stable angina pectoris ([place]) 01/02/2016   IBS (irritable bowel syndrome) 01/02/2016   FH: colon cancer 01/02/2016   DDD (degenerative disc disease), cervical 01/02/2016   Primary osteoarthritis of left knee 01/02/2016   Tobacco use disorder 01/02/2016    This entire session was performed under direct supervision and direction of a licensed therapist/therapist assistant . I have personally read, edited and approve of the note as written.   Fara Olden, SPT Phillips Grout PT, DPT, GCS  Huprich,Jason 07/11/2021, 2:13 PM  [facility] [address] [place], [place], [address] Phone: [phone]   Fax:  [phone]  Name: RILEIGH KAWASHIMA MRN: [id number] Date of Birth: 07/16/1946

## 2021-07-14 ENCOUNTER — Ambulatory Visit: Payer: Medicare Other

## 2021-07-14 ENCOUNTER — Other Ambulatory Visit: Payer: Self-pay

## 2021-07-14 DIAGNOSIS — Z9181 History of falling: Secondary | ICD-10-CM | POA: Diagnosis not present

## 2021-07-14 DIAGNOSIS — R2681 Unsteadiness on feet: Secondary | ICD-10-CM

## 2021-07-14 DIAGNOSIS — M6281 Muscle weakness (generalized): Secondary | ICD-10-CM | POA: Diagnosis not present

## 2021-07-14 NOTE — Therapy (Signed)
[facility] [address] [place], [place], [address] Phone: [phone]   Fax:  [phone]  Physical Therapy Treatment  Patient Details  Name: Kristi [name] MRN: [id number] Date of Birth: Sep 02, 1946 Referring Provider (PT): Dr. Army Melia   Encounter Date: 07/14/2021   PT End of Session - 07/14/21 1148     Visit Number 4    Number of Visits 25    Date for PT Re-Evaluation 09/25/21    Authorization Type eval: 07/03/21    PT Start Time 3149    PT Stop Time 1230    PT Time Calculation (min) 45 min    Equipment Utilized During Treatment Gait belt    Activity Tolerance Patient tolerated treatment well    Behavior During Therapy [facility] for tasks assessed/performed             Past Medical History:  Diagnosis Date   Acute MI, inferoposterior wall ([place]) 09/30/2014   1 stent   Claustrophobia    Coronary artery disease    GERD (gastroesophageal reflux disease)    Hypercholesteremia    MI, old    Pneumonia    Small cell lung cancer ([place])    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 ([place]) 10/27/2018    Past Surgical History:  Procedure Laterality Date   APPENDECTOMY     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   BLADDER NECK SUSPENSION     CHOLECYSTECTOMY N/A 08/14/2018   Procedure: LAPAROSCOPIC CHOLECYSTECTOMY;  Surgeon: Jules Husbands, MD;  Location: [facility] ORS;  Service: General;  Laterality: N/A;   CHOLECYSTECTOMY  11/2018   CORONARY ANGIOPLASTY  09/29/2014   CORONARY STENT PLACEMENT     ENDOBRONCHIAL ULTRASOUND N/A 10/21/2018   Procedure: ENDOBRONCHIAL ULTRASOUND;  Surgeon: Laverle Hobby, MD;  Location: [facility] ORS;  Service: Pulmonary;  Laterality: N/A;   HERNIA REPAIR  2012   IR FLUORO GUIDE CV LINE RIGHT  12/19/2018   KYPHOPLASTY N/A 03/01/2020   Procedure: T7 KYPHOPLASTY;  Surgeon: Hessie Knows, MD;  Location: [facility] ORS;  Service: Orthopedics;  Laterality: N/A;   LAPAROTOMY     LEFT HEART CATHETERIZATION WITH CORONARY ANGIOGRAM N/A  09/29/2014   Procedure: LEFT HEART CATHETERIZATION WITH CORONARY ANGIOGRAM;  Surgeon: Clent Demark, MD;  Location: [place] CATH LAB;  Service: Cardiovascular;  Laterality: N/A;   PORTA CATH INSERTION N/A 01/02/2019   Procedure: PORTA CATH INSERTION;  Surgeon: Algernon Huxley, MD;  Location: [place] CV LAB;  Service: Cardiovascular;  Laterality: N/A;   PORTACATH PLACEMENT Right 10/28/2018   Procedure: INSERTION PORT-A-CATH;  Surgeon: Jules Husbands, MD;  Location: [facility] ORS;  Service: General;  Laterality: Right;   XI ROBOTIC ASSISTED VENTRAL HERNIA N/A 02/27/2021   Procedure: XI ROBOTIC ASSISTED VENTRAL HERNIA (incisional) WITH MESH;  Surgeon: Olean Ree, MD;  Location: [facility] ORS;  Service: General;  Laterality: N/A;    There were no vitals filed for this visit.   Subjective Assessment - 07/14/21 1147     Subjective Pt presents without any questions or concerns. She denies any falls since last therapy session. Complains of chronic 5/10 thoracic pain.    Pertinent History Pt reports a history of difficulty with her balance since the start of her treatment for her lung cancer. "I'm afraid of falling." She reports 4-5 falls in the last 6 months and during one fall she injured her L shoulder (skin avulsion). She has a rollator but doesn't use it often. She states that she is more fearful  of walking when she uses the rollator. She gets and infusion for her lung cancer every 3 weeks. She has a history of compression fractures in her spine and states that she has had what sounds like a kyphoplasty but still has considerable back pain. She is not involved in any kind of regular exercise program.    Limitations Walking    Patient Stated Goals Decrease fear of falling                  TREATMENT   Ther-ex  Sit to stand from regular height chair with Airex pad on seat without UE support x 10, Airex pad removed and pt is able to perform an additional 6 reps; Seated LAQ with 3# ankle weights x 1  minute;;  Standing hip strengthening with 3# ankle weights x 1 minute each: Hip flexion marches HS curls Hip abduction Hip extension  Standing heel raises with BUE support x 20;   Neuromuscular re-ed: // bars with gait belt, CGA-Min A, verbal/contact cueing for proper LE placement and stepping mechanics:  Tandem balance alternating forward LE 2 x 30s each; Airex eyes open/closed x 30s each; Airex eyes open horizontal and vertical head turns x 30s each; Airex alternating 6" step taps x 10 BLE; Airex balance with front foot on 6" step alternating LE x 30s each;   Pt educated throughout session about proper posture and technique with exercises. Improved exercise technique, movement at target joints, use of target muscles after min to mod verbal, visual, tactile cues.    Pt demonstrates excellent motivation during session today.  Continued strengthening with patient while providing frequent rest breaks secondary to fatigue.  Repeated bouts exercise with patient today.  She is challenged in tandem stance as well as on unstable surfaces.  Patient encouraged to continue her HEP and follow-up as scheduled.  She will benefit from PT services to address deficits in strength, balance, and mobility in order to return to full function at home with decreased risk for falls.                            PT Short Term Goals - 07/03/21 1223       PT SHORT TERM GOAL #1   Title Pt will be independent with HEP in order to improve strength and balance in order to decrease fall risk and improve function at home and work.    Time 6    Period Weeks    Status New    Target Date 08/14/21               PT Long Term Goals - 07/03/21 1224       PT LONG TERM GOAL #1   Title Pt will improve BERG by at least 3 points in order to demonstrate clinically significant improvement in balance.    Baseline 07/03/21: 41/56    Time 12    Period Weeks    Status New    Target Date 09/25/21       PT LONG TERM GOAL #2   Title Pt will improve FOTO to at least 51 in order to demonstrate clinically significant improvement in balance and decreased risk for falls.    Baseline 07/03/21: 39    Time 12    Period Weeks    Status New    Target Date 09/25/21      PT LONG TERM GOAL #3   Title Pt will be  able to perform sit to stand without UE support in order to demonstrate clinically significant improvement in leg strength and decreased risk for falls    Baseline 07/03/21: unable to perform    Time 12    Period Weeks    Status New    Target Date 09/25/21      PT LONG TERM GOAL #4   Title Pt will improve ABC by at least 13% in order to demonstrate clinically significant improvement in balance confidence.    Baseline 07/03/21: 41.25%    Time 12    Period Weeks    Status New    Target Date 09/25/21                   Plan - 07/14/21 1149     Clinical Impression Statement Pt demonstrates excellent motivation during session today.  Continued strengthening with patient while providing frequent rest breaks secondary to fatigue.  Repeated bouts exercise with patient today.  She is challenged in tandem stance as well as on unstable surfaces.  Patient encouraged to continue her HEP and follow-up as scheduled.  She will benefit from PT services to address deficits in strength, balance, and mobility in order to return to full function at home with decreased risk for falls.    Personal Factors and Comorbidities Age;Comorbidity 3+    Comorbidities Lung cancer, memory deficit, CAD, MI    Examination-Activity Limitations Locomotion Level;Stairs    Examination-Participation Restrictions Community Activity;Laundry;Meal Prep;Shop    Stability/Clinical Decision Making Evolving/Moderate complexity    Rehab Potential Fair    PT Frequency 2x / week    PT Duration 12 weeks    PT Treatment/Interventions ADLs/Self Care Home Management;Aquatic Therapy;Biofeedback;Canalith  Repostioning;Cryotherapy;Electrical Stimulation;Iontophoresis 4mg /ml Dexamethasone;Moist Heat;Traction;Ultrasound;DME Instruction;Gait training;Stair training;Functional mobility training;Therapeutic activities;Therapeutic exercise;Balance training;Neuromuscular re-education;Patient/family education;Cognitive remediation;Manual techniques;Passive range of motion;Energy conservation;Dry needling;Vestibular;Spinal Manipulations;Joint Manipulations    PT Next Visit Plan Initiate strengthening and balance activities, prescribe HEP    PT Home Exercise Plan [email or web address]             Patient will benefit from skilled therapeutic intervention in order to improve the following deficits and impairments:  Decreased balance, Decreased strength, Abnormal gait  Visit Diagnosis: Muscle weakness (generalized)  Unsteadiness on feet  History of falling     Problem List Patient Active Problem List   Diagnosis Date Noted   MCI (mild cognitive impairment) 06/19/2021   Aortic atherosclerosis ([place]) 06/18/2021   Incisional hernia, without obstruction or gangrene    Thrombocytopenia ([place]) 02/12/2021   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 12/05/2020   Diarrhea 12/05/2020   Osteopenia 08/12/2020   Ventral hernia without obstruction or gangrene 08/12/2020   Thoracic compression fracture, closed, initial encounter ([place]) 01/22/2020   Frequent falls 01/22/2020   Encounter for antineoplastic immunotherapy 09/14/2019   Memory loss 09/14/2019   Other fatigue 09/14/2019   Cancer of hilus of left lung ([place]) 06/08/2019   Dehydration 01/30/2019   Acute pyelonephritis 01/24/2019   Anemia due to antineoplastic chemotherapy 01/09/2019   Encounter for antineoplastic chemotherapy 11/02/2018   Cough 10/28/2018   Dysphagia 10/28/2018   Decrease in appetite 10/28/2018   Goals of care, counseling/discussion 10/28/2018   Small cell lung cancer ([place]) 10/27/2018   Grade I diastolic dysfunction 28/31/5176   Localized edema  08/29/2018   Cholecystitis 08/13/2018   Osteopenia determined by x-ray 05/19/2018   Prediabetes 02/08/2018   Hx of fracture of radius 08/18/2016   Vitamin D deficiency 01/07/2016   Hyperlipidemia 01/02/2016  Hypertension 01/02/2016   Chronic pain 01/02/2016   Obesity (BMI 30.0-34.9) 01/02/2016   Coronary artery disease with stable angina pectoris ([place]) 01/02/2016   IBS (irritable bowel syndrome) 01/02/2016   FH: colon cancer 01/02/2016   DDD (degenerative disc disease), cervical 01/02/2016   Primary osteoarthritis of left knee 01/02/2016   Tobacco use disorder 01/02/2016   Phillips Grout PT, DPT, GCS  Jade Burright 07/15/2021, 8:57 AM  [facility] [address] [place], [place], [address] Phone: [phone]   Fax:  [phone]  Name: HILLARY STRUSS MRN: [id number] Date of Birth: [birth year]

## 2021-07-18 ENCOUNTER — Other Ambulatory Visit: Payer: Self-pay

## 2021-07-18 ENCOUNTER — Ambulatory Visit: Payer: Medicare Other

## 2021-07-18 VITALS — BP 101/73 | HR 75

## 2021-07-18 DIAGNOSIS — M6281 Muscle weakness (generalized): Secondary | ICD-10-CM | POA: Diagnosis not present

## 2021-07-18 DIAGNOSIS — R2681 Unsteadiness on feet: Secondary | ICD-10-CM | POA: Diagnosis not present

## 2021-07-18 DIAGNOSIS — Z9181 History of falling: Secondary | ICD-10-CM | POA: Diagnosis not present

## 2021-07-18 NOTE — Therapy (Signed)
[facility] [address] [place], [place], [address] Phone: [phone]   Fax:  [phone]  Physical Therapy Treatment  Patient Details  Name: Kristi [name] MRN: [id number] Date of Birth: October 12, 1946 Referring Provider (PT): Dr. Army Melia   Encounter Date: 07/18/2021   PT End of Session - 07/18/21 1152     Visit Number 5    Number of Visits 25    Date for PT Re-Evaluation 09/25/21    Authorization Type eval: 07/03/21    PT Start Time 6073    PT Stop Time 1230    PT Time Calculation (min) 45 min    Equipment Utilized During Treatment Gait belt    Activity Tolerance Patient tolerated treatment well    Behavior During Therapy [facility] for tasks assessed/performed             Past Medical History:  Diagnosis Date   Acute MI, inferoposterior wall ([place]) 09/30/2014   1 stent   Claustrophobia    Coronary artery disease    GERD (gastroesophageal reflux disease)    Hypercholesteremia    MI, old    Pneumonia    Small cell lung cancer ([place])    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 ([place]) 10/27/2018    Past Surgical History:  Procedure Laterality Date   APPENDECTOMY     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   BLADDER NECK SUSPENSION     CHOLECYSTECTOMY N/A 08/14/2018   Procedure: LAPAROSCOPIC CHOLECYSTECTOMY;  Surgeon: Jules Husbands, MD;  Location: [facility] ORS;  Service: General;  Laterality: N/A;   CHOLECYSTECTOMY  11/2018   CORONARY ANGIOPLASTY  09/29/2014   CORONARY STENT PLACEMENT     ENDOBRONCHIAL ULTRASOUND N/A 10/21/2018   Procedure: ENDOBRONCHIAL ULTRASOUND;  Surgeon: Laverle Hobby, MD;  Location: [facility] ORS;  Service: Pulmonary;  Laterality: N/A;   HERNIA REPAIR  2012   IR FLUORO GUIDE CV LINE RIGHT  12/19/2018   KYPHOPLASTY N/A 03/01/2020   Procedure: T7 KYPHOPLASTY;  Surgeon: Hessie Knows, MD;  Location: [facility] ORS;  Service: Orthopedics;  Laterality: N/A;   LAPAROTOMY     LEFT HEART CATHETERIZATION WITH CORONARY ANGIOGRAM N/A  09/29/2014   Procedure: LEFT HEART CATHETERIZATION WITH CORONARY ANGIOGRAM;  Surgeon: Clent Demark, MD;  Location: [facility] CATH LAB;  Service: Cardiovascular;  Laterality: N/A;   PORTA CATH INSERTION N/A 01/02/2019   Procedure: PORTA CATH INSERTION;  Surgeon: Algernon Huxley, MD;  Location: [facility] CV [facility];  Service: Cardiovascular;  Laterality: N/A;   PORTACATH PLACEMENT Right 10/28/2018   Procedure: INSERTION PORT-A-CATH;  Surgeon: Jules Husbands, MD;  Location: [facility] ORS;  Service: General;  Laterality: Right;   XI ROBOTIC ASSISTED VENTRAL HERNIA N/A 02/27/2021   Procedure: XI ROBOTIC ASSISTED VENTRAL HERNIA (incisional) WITH MESH;  Surgeon: Olean Ree, MD;  Location: [facility] ORS;  Service: General;  Laterality: N/A;    Vitals:   07/18/21 1152  BP: 101/73  Pulse: 75  SpO2: 99%     Subjective Assessment - 07/18/21 1151     Subjective Pt presents without any questions or concerns. She denies any falls since last therapy session. States that she is not feeling great today. She is having some nausea but no vomiting. Denies chills, fever, chest pain, cough, or SOB. Complains of chronic 6/10 thoracic pain.    Pertinent History Pt reports a history of difficulty with her balance since the start of her treatment for her lung cancer. "I'm afraid of falling." She reports 4-5 falls  in the last 6 months and during one fall she injured her L shoulder (skin avulsion). She has a rollator but doesn't use it often. She states that she is more fearful of walking when she uses the rollator. She gets and infusion for her lung cancer every 3 weeks. She has a history of compression fractures in her spine and states that she has had what sounds like a kyphoplasty but still has considerable back pain. She is not involved in any kind of regular exercise program.    Limitations Walking    Patient Stated Goals Decrease fear of falling                  TREATMENT   Ther-ex  NuStep L2 x 7 minutes for warm-up  during interval history (5 minutes unbilled); Sit to stand from regular height chair 2 x 10; Seated LAQ with 3# ankle weights 2 x 1 minute;;  Standing hip strengthening with 3# ankle weights x 1 minute each: Hip flexion marches HS curls Hip abduction Hip extension   Neuromuscular re-ed: // bars with gait belt and no UE support, CGA-Min A, verbal/contact cueing for proper LE placement and stepping mechanics:  Tandem balance alternating forward LE 2 x 30s each; Tandem walking x4 lengths; Alternating 6 inch hurdle steps forward and backwards x10 each side;   Pt educated throughout session about proper posture and technique with exercises. Improved exercise technique, movement at target joints, use of target muscles after min to mod verbal, visual, tactile cues.    Pt demonstrates excellent motivation during session today.  Continued strengthening with patient while providing frequent rest breaks secondary to fatigue.  Repeated balance training with patient today.  Repeated tandem stance as this was challenging for patient during last session.  Will continue to progress strength and balance exercises during follow-up sessions.  She is demonstrating significant improvement in bilateral lower extremity strength with sit to stands.  Patient encouraged to continue her HEP and follow-up as scheduled.  She will benefit from PT services to address deficits in strength, balance, and mobility in order to return to full function at home with decreased risk for falls.                            PT Short Term Goals - 07/03/21 1223       PT SHORT TERM GOAL #1   Title Pt will be independent with HEP in order to improve strength and balance in order to decrease fall risk and improve function at home and work.    Time 6    Period Weeks    Status New    Target Date 08/14/21               PT Long Term Goals - 07/03/21 1224       PT LONG TERM GOAL #1   Title Pt will  improve BERG by at least 3 points in order to demonstrate clinically significant improvement in balance.    Baseline 07/03/21: 41/56    Time 12    Period Weeks    Status New    Target Date 09/25/21      PT LONG TERM GOAL #2   Title Pt will improve FOTO to at least 51 in order to demonstrate clinically significant improvement in balance and decreased risk for falls.    Baseline 07/03/21: 39    Time 12    Period Weeks  Status New    Target Date 09/25/21      PT LONG TERM GOAL #3   Title Pt will be able to perform sit to stand without UE support in order to demonstrate clinically significant improvement in leg strength and decreased risk for falls    Baseline 07/03/21: unable to perform    Time 12    Period Weeks    Status New    Target Date 09/25/21      PT LONG TERM GOAL #4   Title Pt will improve ABC by at least 13% in order to demonstrate clinically significant improvement in balance confidence.    Baseline 07/03/21: 41.25%    Time 12    Period Weeks    Status New    Target Date 09/25/21                   Plan - 07/18/21 1153     Clinical Impression Statement Pt demonstrates excellent motivation during session today.  Continued strengthening with patient while providing frequent rest breaks secondary to fatigue.  Repeated balance training with patient today.  Repeated tandem stance as this was challenging for patient during last session.  Will continue to progress strength and balance exercises during follow-up sessions.  She is demonstrating significant improvement in bilateral lower extremity strength with sit to stands.  Patient encouraged to continue her HEP and follow-up as scheduled.  She will benefit from PT services to address deficits in strength, balance, and mobility in order to return to full function at home with decreased risk for falls.    Personal Factors and Comorbidities Age;Comorbidity 3+    Comorbidities Lung cancer, memory deficit, CAD, MI     Examination-Activity Limitations Locomotion Level;Stairs    Examination-Participation Restrictions Community Activity;Laundry;Meal Prep;Shop    Stability/Clinical Decision Making Evolving/Moderate complexity    Rehab Potential Fair    PT Frequency 2x / week    PT Duration 12 weeks    PT Treatment/Interventions ADLs/Self Care Home Management;Aquatic Therapy;Biofeedback;Canalith Repostioning;Cryotherapy;Electrical Stimulation;Iontophoresis 4mg /ml Dexamethasone;Moist Heat;Traction;Ultrasound;DME Instruction;Gait training;Stair training;Functional mobility training;Therapeutic activities;Therapeutic exercise;Balance training;Neuromuscular re-education;Patient/family education;Cognitive remediation;Manual techniques;Passive range of motion;Energy conservation;Dry needling;Vestibular;Spinal Manipulations;Joint Manipulations    PT Next Visit Plan Continue strengthening and balance activities    PT Home Exercise Plan [id number]             Patient will benefit from skilled therapeutic intervention in order to improve the following deficits and impairments:  Decreased balance, Decreased strength, Abnormal gait  Visit Diagnosis: Muscle weakness (generalized)  Unsteadiness on feet     Problem List Patient Active Problem List   Diagnosis Date Noted   MCI (mild cognitive impairment) 06/19/2021   Aortic atherosclerosis ([place]) 06/18/2021   Incisional hernia, without obstruction or gangrene    Thrombocytopenia ([place]) 02/12/2021   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 12/05/2020   Diarrhea 12/05/2020   Osteopenia 08/12/2020   Ventral hernia without obstruction or gangrene 08/12/2020   Thoracic compression fracture, closed, initial encounter ([place]) 01/22/2020   Frequent falls 01/22/2020   Encounter for antineoplastic immunotherapy 09/14/2019   Memory loss 09/14/2019   Other fatigue 09/14/2019   Cancer of hilus of left lung ([place]) 06/08/2019   Dehydration 01/30/2019   Acute pyelonephritis  01/24/2019   Anemia due to antineoplastic chemotherapy 01/09/2019   Encounter for antineoplastic chemotherapy 11/02/2018   Cough 10/28/2018   Dysphagia 10/28/2018   Decrease in appetite 10/28/2018   Goals of care, counseling/discussion 10/28/2018   Small cell lung cancer ([place]) 10/27/2018  Grade I diastolic dysfunction 36/14/4315   Localized edema 08/29/2018   Cholecystitis 08/13/2018   Osteopenia determined by x-ray 05/19/2018   Prediabetes 02/08/2018   Hx of fracture of radius 08/18/2016   Vitamin D deficiency 01/07/2016   Hyperlipidemia 01/02/2016   Hypertension 01/02/2016   Chronic pain 01/02/2016   Obesity (BMI 30.0-34.9) 01/02/2016   Coronary artery disease with stable angina pectoris ([place]) 01/02/2016   IBS (irritable bowel syndrome) 01/02/2016   FH: colon cancer 01/02/2016   DDD (degenerative disc disease), cervical 01/02/2016   Primary osteoarthritis of left knee 01/02/2016   Tobacco use disorder 01/02/2016   Phillips Grout PT, DPT, GCS  Deshawn Skelley 07/18/2021, 1:11 PM  [facility] [address] [place], [place], [address] Phone: [phone]   Fax:  [phone]  Name: Kristi [name] MRN: [id number] Date of Birth: Jul 02, 1946

## 2021-07-21 ENCOUNTER — Inpatient Hospital Stay: Payer: Medicare Other

## 2021-07-21 ENCOUNTER — Other Ambulatory Visit: Payer: Self-pay

## 2021-07-21 ENCOUNTER — Encounter: Payer: Self-pay | Admitting: Oncology

## 2021-07-21 ENCOUNTER — Inpatient Hospital Stay: Payer: Medicare Other | Attending: Oncology

## 2021-07-21 ENCOUNTER — Inpatient Hospital Stay (HOSPITAL_BASED_OUTPATIENT_CLINIC_OR_DEPARTMENT_OTHER): Payer: Medicare Other | Admitting: Oncology

## 2021-07-21 VITALS — BP 110/72 | HR 78 | Temp 97.2°F | Resp 14 | Wt 144.8 lb

## 2021-07-21 DIAGNOSIS — Z87891 Personal history of nicotine dependence: Secondary | ICD-10-CM | POA: Diagnosis not present

## 2021-07-21 DIAGNOSIS — K219 Gastro-esophageal reflux disease without esophagitis: Secondary | ICD-10-CM | POA: Diagnosis not present

## 2021-07-21 DIAGNOSIS — M8580 Other specified disorders of bone density and structure, unspecified site: Secondary | ICD-10-CM

## 2021-07-21 DIAGNOSIS — C3490 Malignant neoplasm of unspecified part of unspecified bronchus or lung: Secondary | ICD-10-CM | POA: Diagnosis not present

## 2021-07-21 DIAGNOSIS — Z5112 Encounter for antineoplastic immunotherapy: Secondary | ICD-10-CM | POA: Insufficient documentation

## 2021-07-21 DIAGNOSIS — Z8589 Personal history of malignant neoplasm of other organs and systems: Secondary | ICD-10-CM | POA: Diagnosis not present

## 2021-07-21 DIAGNOSIS — I6782 Cerebral ischemia: Secondary | ICD-10-CM

## 2021-07-21 LAB — COMPREHENSIVE METABOLIC PANEL
ALT: 15 U/L (ref 0–44)
AST: 20 U/L (ref 15–41)
Albumin: 3.9 g/dL (ref 3.5–5.0)
Alkaline Phosphatase: 45 U/L (ref 38–126)
Anion gap: 9 (ref 5–15)
BUN: 15 mg/dL (ref 8–23)
CO2: 24 mmol/L (ref 22–32)
Calcium: 9 mg/dL (ref 8.9–10.3)
Chloride: 103 mmol/L (ref 98–111)
Creatinine, Ser: 0.78 mg/dL (ref 0.44–1.00)
GFR, Estimated: >60 mL/min (ref >60)
Glucose, Bld: 95 mg/dL (ref 70–99)
Potassium: 3.5 mmol/L (ref 3.5–5.1)
Sodium: 136 mmol/L (ref 135–145)
Total Bilirubin: 0.6 mg/dL (ref 0.3–1.2)
Total Protein: 6.9 g/dL (ref 6.5–8.1)

## 2021-07-21 LAB — CBC WITH DIFFERENTIAL/PLATELET
Abs Immature Granulocytes: 0.04 10*3/uL (ref 0.00–0.07)
Basophils Absolute: 0 10*3/uL (ref 0.0–0.1)
Basophils Relative: 1 %
Eosinophils Absolute: 0.5 10*3/uL (ref 0.0–0.5)
Eosinophils Relative: 7 %
HCT: 33.3 % — ABNORMAL LOW (ref 36.0–46.0)
Hemoglobin: 11.1 g/dL — ABNORMAL LOW (ref 12.0–15.0)
Immature Granulocytes: 1 %
Lymphocytes Relative: 21 %
Lymphs Abs: 1.6 10*3/uL (ref 0.7–4.0)
MCH: 30.5 pg (ref 26.0–34.0)
MCHC: 33.3 g/dL (ref 30.0–36.0)
MCV: 91.5 fL (ref 80.0–100.0)
Monocytes Absolute: 0.9 10*3/uL (ref 0.1–1.0)
Monocytes Relative: 12 %
Neutro Abs: 4.4 10*3/uL (ref 1.7–7.7)
Neutrophils Relative %: 58 %
Platelets: 186 10*3/uL (ref 150–400)
RBC: 3.64 MIL/uL — ABNORMAL LOW (ref 3.87–5.11)
RDW: 13.8 % (ref 11.5–15.5)
WBC: 7.5 10*3/uL (ref 4.0–10.5)
nRBC: 0 % (ref 0.0–0.2)

## 2021-07-21 MED ORDER — SODIUM CHLORIDE 0.9 % IV SOLN
Freq: Once | INTRAVENOUS | Status: AC
  Filled 2021-07-21: qty 250

## 2021-07-21 MED ORDER — HEPARIN SOD (PORK) LOCK FLUSH 100 UNIT/ML IV SOLN
INTRAVENOUS | Status: AC
  Filled 2021-07-21: qty 5

## 2021-07-21 MED ORDER — SODIUM CHLORIDE 0.9% FLUSH
10.0000 mL | Freq: Once | INTRAVENOUS | Status: AC
Start: 2021-07-21 — End: 2021-07-21
  Administered 2021-07-21: 10 mL via INTRAVENOUS
  Filled 2021-07-21: qty 10

## 2021-07-21 MED ORDER — SODIUM CHLORIDE 0.9 % IV SOLN
1200.0000 mg | Freq: Once | INTRAVENOUS | Status: AC
  Administered 2021-07-21: 1200 mg via INTRAVENOUS
  Filled 2021-07-21: qty 20

## 2021-07-21 MED ORDER — HEPARIN SOD (PORK) LOCK FLUSH 100 UNIT/ML IV SOLN
500.0000 [IU] | Freq: Once | INTRAVENOUS | Status: AC
  Administered 2021-07-21: 500 [IU] via INTRAVENOUS
  Filled 2021-07-21: qty 5

## 2021-07-21 MED ORDER — ACETAMINOPHEN 325 MG PO TABS
650.0000 mg | ORAL_TABLET | Freq: Once | ORAL | Status: AC
  Administered 2021-07-21: 650 mg via ORAL
  Filled 2021-07-21: qty 2

## 2021-07-21 NOTE — Progress Notes (Signed)
Hematology/Oncology Follow up note [facility] Telephone:([phone] Fax:([phone]) [phone]   Patient Care Team: Glean Hess, MD as PCP - General (Internal Medicine) Charolette Forward, MD as Consulting Physician (Cardiology) Telford Nab, RN as Registered Nurse [name], MD as Radiation Oncologist (Radiation Oncology)   REASON FOR VISIT:  Follow-up for small cell lung cancer,   HISTORY OF PRESENTING ILLNESS:  Kristi [name] is a  75 y.o.  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  10/20/2018 CT chest with contrast showed large mediastinal mass involving both hilar, left greater than right, consistent with lung carcinoma, The mass causes narrowing of the left mainstem bronchus with resultant volume loss on the left and a mediastinal shift to the left.  Moderate size left pleural effusion. 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  # Initial MRI brain negative.  # Nov 2019- Jan 2020 s/p 4 cycles of Carbo/Etoposide/Tecentriq #  01/24/2019 interim CT scan done which showed continued positive response to therapy with continued reduction in mediastinal adenopathy.  No residual measurable left lung mass. CT findings of acute emphysematous cystitis. Urology did not feel that patient has pyelonephritis and recommend treatment with antibiotics because patient's immunocompromise. Patient finished treatment.   # 11/02/2018-01/09/2019 Chemotherapy carboplatin + Etoposide + tecentriq # Consolidation chest radiation and whole brain radiation finished in May 2020 # 04/25/2019 resume Tecentriq every 3 weeks. # Patient had a fall from her stairs on 01/19/2020. In the emergency room she had CT chest abdomen pelvis CT, CT head without contrast, CT cervical spine without contrast. No CT evidence for acute intracranial abnormality, degenerative  changes of cervical spine..  No CT evidence of acute thoracic abdomen injury.  Severe compression fracture of T7, subacute.  # kyphoplasty procedure on 03/01/2020.  T7 biopsy negative for cancer.  11/20/2020 Surveillance CT scan  Reduced size and prominence of right lateral lung base subpleural nodular density. No findings of active malignancy.  MRI brain showed stable post treatment brain. No metastatic disease.    INTERVAL HISTORY Kristi [name] is a 75 y.o. female who has above history reviewed by me today presents for evaluation prior to immunotherapy for treatment of extensive small cell lung cancer. Patient is on immunotherapy maintenance. Reports doing well except intermittent chronic headache, she takes Tylenol which helps to relieve the headache. 4 out of 10.  Chronic back pain, per husband, patient sometimes takes pain medication. .  Review of Systems  Constitutional:  Positive for fatigue. Negative for appetite change, chills and fever.  HENT:   Negative for hearing loss and voice change.   Eyes:  Negative for eye problems.  Respiratory:  Negative for chest tightness, cough and shortness of breath.   Cardiovascular:  Negative for chest pain.  Gastrointestinal:  Negative for abdominal distention, abdominal pain, blood in stool, diarrhea and nausea.  Endocrine: Negative for hot flashes.  Genitourinary:  Negative for difficulty urinating and frequency.   Musculoskeletal:  Positive for back pain. Negative for arthralgias.  Skin:  Negative for itching and rash.  Neurological:  Positive for headaches. Negative for extremity weakness.  Hematological:  Negative for adenopathy.  Psychiatric/Behavioral:  Negative for confusion and depression. The patient is not nervous/anxious.        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[place]) 09/30/2014   1 stent  Claustrophobia    Coronary artery disease    GERD  (gastroesophageal reflux disease)    Hypercholesteremia    MI, old    Pneumonia    Small cell lung cancer ([place])    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 ([place]) 10/27/2018    SURGICAL HISTORY: Past Surgical History:  Procedure Laterality Date   APPENDECTOMY     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   BLADDER NECK SUSPENSION     CHOLECYSTECTOMY N/A 08/14/2018   Procedure: LAPAROSCOPIC CHOLECYSTECTOMY;  Surgeon: Jules Husbands, MD;  Location: [facility] ORS;  Service: General;  Laterality: N/A;   CHOLECYSTECTOMY  11/2018   CORONARY ANGIOPLASTY  09/29/2014   CORONARY STENT PLACEMENT     ENDOBRONCHIAL ULTRASOUND N/A 10/21/2018   Procedure: ENDOBRONCHIAL ULTRASOUND;  Surgeon: Laverle Hobby, MD;  Location: [facility] ORS;  Service: Pulmonary;  Laterality: N/A;   HERNIA REPAIR  2012   IR FLUORO GUIDE CV LINE RIGHT  12/19/2018   KYPHOPLASTY N/A 03/01/2020   Procedure: T7 KYPHOPLASTY;  Surgeon: Hessie Knows, MD;  Location: [facility] ORS;  Service: Orthopedics;  Laterality: N/A;   LAPAROTOMY     LEFT HEART CATHETERIZATION WITH CORONARY ANGIOGRAM N/A 09/29/2014   Procedure: LEFT HEART CATHETERIZATION WITH CORONARY ANGIOGRAM;  Surgeon: Clent Demark, MD;  Location: [place] CATH LAB;  Service: Cardiovascular;  Laterality: N/A;   PORTA CATH INSERTION N/A 01/02/2019   Procedure: PORTA CATH INSERTION;  Surgeon: Algernon Huxley, MD;  Location: [place] CV LAB;  Service: Cardiovascular;  Laterality: N/A;   PORTACATH PLACEMENT Right 10/28/2018   Procedure: INSERTION PORT-A-CATH;  Surgeon: Jules Husbands, MD;  Location: [facility] ORS;  Service: General;  Laterality: Right;   XI ROBOTIC ASSISTED VENTRAL HERNIA N/A 02/27/2021   Procedure: XI ROBOTIC ASSISTED VENTRAL HERNIA (incisional) WITH MESH;  Surgeon: Olean Ree, MD;  Location: [facility] ORS;  Service: General;  Laterality: N/A;    SOCIAL HISTORY: Social History   Socioeconomic History   Marital status: Married    Spouse name: [name]    Number of children: 3   Years of education:  Not on file   Highest education level: Not on file  Occupational History   Occupation: Retired    Comment: Chief Technology Officer   Tobacco Use   Smoking status: Former    Packs/day: 1.00    Years: 39.00    Pack years: 39.00    Types: Cigarettes    Start date: 12/21/1978    Quit date: 10/16/2018    Years since quitting: 2.7   Smokeless tobacco: Never  Vaping Use   Vaping Use: Never used  Substance and Sexual Activity   Alcohol use: Yes    Alcohol/week: 0.0 standard drinks    Comment: occassional - approx 1 every 2 weeks    Drug use: No   Sexual activity: Yes    Birth control/protection: None  Other Topics Concern   Not on file  Social History Narrative   Not on file   Social Determinants of Health   Financial Resource Strain: Not on file  Food Insecurity: Not on file  Transportation Needs: Not on file  Physical Activity: Not on file  Stress: Not on file  Social Connections: Not on file  Intimate Partner Violence: Not on file    FAMILY HISTORY: Family History  Problem Relation Age of Onset   Alzheimer's disease Mother    Colon cancer Father    Breast cancer Neg Hx     ALLERGIES:  has No Known Allergies.  MEDICATIONS:  Current Outpatient Medications  Medication Sig Dispense Refill   acetaminophen (TYLENOL) 500 MG tablet Take 2 tablets (1,000 mg total) by mouth every 6 (six) hours as needed for mild pain or headache.     aspirin EC 81 MG tablet Take 1 tablet (81 mg total) by mouth daily. Swallow whole. 30 tablet 0   atorvastatin (LIPITOR) 10 MG tablet Take 1 tablet (10 mg total) by mouth daily. 90 tablet 3   Calcium 600-200 MG-UNIT tablet Take 1 tablet by mouth 2 (two) times daily.     Cholecalciferol (DIALYVITE VITAMIN D 5000) 125 MCG (5000 UT) capsule Take 5,000 Units by mouth daily.     lidocaine-prilocaine (EMLA) cream Apply 1 application topically daily as needed (port access). 30 g 2   Magnesium 250 MG TABS Take 250 mg by mouth daily.     oxyCODONE (ROXICODONE) 5 MG  immediate release tablet Take 1 tablet (5 mg total) by mouth every 6 (six) hours as needed for severe pain. 30 tablet 0   pantoprazole (PROTONIX) 40 MG tablet Take 1 tablet (40 mg total) by mouth 2 (two) times daily. 60 tablet 0   sertraline (ZOLOFT) 100 MG tablet Take 1 tablet (100 mg total) by mouth daily. 90 tablet 3   sucralfate (CARAFATE) 1 g tablet Take 1 g by mouth 3 (three) times daily as needed (heartburn).     traZODone (DESYREL) 50 MG tablet Take 1 tablet (50 mg total) by mouth at bedtime as needed for sleep. 30 tablet 2   vitamin B-12 (CYANOCOBALAMIN) 1000 MCG tablet Take 1,000 mcg by mouth daily.     prochlorperazine (COMPAZINE) 10 MG tablet Take 10 mg by mouth every 6 (six) hours as needed for nausea or vomiting. (Patient not taking: Reported on 07/21/2021)     No current facility-administered medications for this visit.   Facility-Administered Medications Ordered in Other Visits  Medication Dose Route Frequency Provider Last Rate Last Admin   heparin lock flush 100 unit/mL  500 Units Intravenous Once Earlie Server, MD       heparin lock flush 100 unit/mL  500 Units Intravenous Once Earlie Server, MD       sodium chloride flush (NS) 0.9 % injection 10 mL  10 mL Intravenous PRN Earlie Server, MD   10 mL at 01/09/19 0820   sodium chloride flush (NS) 0.9 % injection 10 mL  10 mL Intravenous PRN Earlie Server, MD   10 mL at 01/10/19 1400     PHYSICAL EXAMINATION: ECOG PERFORMANCE STATUS: 1 - Symptomatic but completely ambulatory Vitals:   07/21/21 1013  BP: 110/72  Pulse: 78  Resp: 14  Temp: (!) 97.2 F (36.2 C)  SpO2: 99%   Filed Weights   07/21/21 1013  Weight: 144 lb 12.8 oz (65.7 kg)   Physical Examination Today's Vitals   07/21/21 1013  BP: 110/72  Pulse: 78  Resp: 14  Temp: (!) 97.2 F (36.2 C)  SpO2: 99%  Weight: 144 lb 12.8 oz (65.7 kg)  PainSc: 4   PainLoc: Back   Body mass index is 22.68 kg/m.   Physical Exam Constitutional:      General: She is not in acute  distress.    Appearance: She is not diaphoretic.     Comments: Patient sits in the wheelchair.  HENT:     Head: Normocephalic and atraumatic.     Nose: Nose normal.     Mouth/Throat:     Pharynx: No oropharyngeal exudate.  Eyes:  General: No scleral icterus.    Pupils: Pupils are equal, round, and reactive to light.  Cardiovascular:     Rate and Rhythm: Normal rate and regular rhythm.     Heart sounds: No murmur heard. Pulmonary:     Effort: Pulmonary effort is normal. No respiratory distress.     Breath sounds: No wheezing or rales.     Comments: Decreased breath sounds bilaterally.  Abdominal:     General: There is no distension.     Palpations: Abdomen is soft.     Tenderness: There is no abdominal tenderness.  Musculoskeletal:        General: Normal range of motion.     Cervical back: Normal range of motion and neck supple.  Skin:    General: Skin is warm and dry.     Findings: No erythema.  Neurological:     Mental Status: She is alert and oriented to person, place, and time.     Cranial Nerves: No cranial nerve deficit.     Motor: No abnormal muscle tone.     Coordination: Coordination normal.  Psychiatric:        Mood and Affect: Affect normal.       LABORATORY DATA:  I have reviewed the data as listed Lab Results  Component Value Date   WBC 7.5 07/21/2021   HGB 11.1 (L) 07/21/2021   HCT 33.3 (L) 07/21/2021   MCV 91.5 07/21/2021   PLT 186 07/21/2021   Recent Labs    08/12/20 1315 09/02/20 1358 09/23/20 0845 10/14/20 0839 06/02/21 0910 06/24/21 1001 07/21/21 0939  NA 134* 137 138   < > 135 134* 136  K 4.0 3.5 4.0   < > 3.8 3.8 3.5  CL 104 105 106   < > 100 100 103  CO2 23 24 24    < > 25 25 24   GLUCOSE 98 103* 91   < > 103* 94 95  BUN 13 18 14    < > 16 11 15   CREATININE 0.81 0.68 0.75   < > 0.81 0.71 0.78  CALCIUM 8.8* 8.3* 8.6*   < > 9.7 9.7 9.0  GFRNONAA >60 >60 >60   < > >60 >60 >60  GFRAA >60 >60 >60  --   --   --   --   PROT 7.4 6.8  7.0   < > 7.3 7.5 6.9  ALBUMIN 4.0 3.7 3.8   < > 4.1 4.0 3.9  AST 17 14* 17   < > 16 24 20   ALT 12 11 11    < > 12 13 15   ALKPHOS 49 49 47   < > 49 58 45  BILITOT 0.5 0.5 0.6   < > 0.6 0.7 0.6   < > = values in this interval not displayed.     RADIOGRAPHIC STUDIES: I have personally reviewed the radiological images as listed and agreed with the findings in the report.  MR Brain W Wo Contrast  Result Date: 05/20/2021 CLINICAL DATA:  75 year old female with small cell lung cancer status post prophylactic whole brain radiation in June 2020. Restaging. EXAM: MRI HEAD WITHOUT AND WITH CONTRAST TECHNIQUE: Multiplanar, multiecho pulse sequences of the brain and surrounding structures were obtained without and with intravenous contrast. CONTRAST:  34mL GADAVIST GADOBUTROL 1 MMOL/ML IV SOLN COMPARISON:  11/20/2020 brain MRI and earlier. FINDINGS: Brain: There are 2 small foci of white matter restricted diffusion in the anterior right frontal lobe. The larger is near the frontal  horn on series 3, image 30 (series 4, image 30) and the smaller in the anterior subcortical white matter or centrum semi of bowel on image 37 (series 4, image 37). Neither is associated with abnormal enhancement, hemorrhage, or mass effect. Questionable small linear diffusion abnormality also in the right cerebellum on series 3, image 13, although no obvious ADC correlate. No associated enhancement. No other restricted diffusion. No abnormal enhancement identified. No dural thickening. No midline shift, mass effect, evidence of mass lesion, ventriculomegaly, extra-axial collection or acute intracranial hemorrhage. Cervicomedullary junction and pituitary are within normal limits. Stable cerebral volume since last year. Advanced bilateral white matter T2 and FLAIR hyperintensity redemonstrated, including involvement of the bilateral deep white matter capsules, and likely XRT related. Similar patchy T2 heterogeneity in the deep gray matter  nuclei and brainstem. No chronic cerebral blood products. Vascular: Major intracranial vascular flow voids are stable. Skull and upper cervical spine: Negative visible cervical spine and spinal cord. Visualized bone marrow signal is within normal limits. Sinuses/Orbits: Stable, negative. Other: Trace mastoid air cell fluid is stable. Negative visible scalp soft tissues. IMPRESSION: 1. Two small foci of restricted diffusion in the right frontal lobe white matter do not enhance and are most compatible with acute small vessel ischemia. Questionable third small focus in the right cerebellum also. 2. Otherwise stable signal changes of whole brain radiation. No metastatic disease identified. Electronically Signed   By: Genevie Ann M.D.   On: 05/20/2021 12:08   CT CHEST ABDOMEN PELVIS W CONTRAST  Result Date: 06/17/2021 CLINICAL DATA:  Small cell lung cancer restaging EXAM: CT CHEST, ABDOMEN, AND PELVIS WITH CONTRAST TECHNIQUE: Multidetector CT imaging of the chest, abdomen and pelvis was performed following the standard protocol during bolus administration of intravenous contrast. CONTRAST:  16mL OMNIPAQUE IOHEXOL 300 MG/ML  SOLN COMPARISON:  Multiple exams, including 02/26/2021 FINDINGS: CT CHEST FINDINGS Cardiovascular: Port-A-Cath tip: Cavoatrial junction. Coronary, aortic arch, and branch vessel atherosclerotic vascular disease. Collateralization of right upper extremity venous structures suggesting possible stenosis in the vicinity of the right subclavian vein. Mediastinum/Nodes: Contrast medium in the esophagus suggesting dysmotility or reflux. Roughly stable indistinctness of tissue planes around the midthoracic esophagus and subcarinal lesion, likely therapy related. Lungs/Pleura: Centrilobular emphysema. Old granulomatous disease. Stable peripheral bandlike scarring in the right lower lobe. Stable scarring anteriorly in the left lower lobe. Posterior subpleural density in the left lower lobe is stable and likely  from scarring. Musculoskeletal: Stable chronic upper sternal body fracture with sclerosis along the fracture margins. Stable T7 compression with vertebral augmentation. Stable inferior endplate compression fracture at T4. CT ABDOMEN PELVIS FINDINGS Hepatobiliary: Unremarkable Pancreas: Unremarkable Spleen: Punctate calcification from old granulomatous disease. Adrenals/Urinary Tract: At least partially duplicated right renal collecting system. Adrenal glands unremarkable. Bladder cystocele extends 2.6 cm below the pubococcygeal line. Stomach/Bowel: Scattered colonic diverticula. Vascular/Lymphatic: Aortoiliac atherosclerotic vascular disease. No pathologic adenopathy identified. Reproductive: Unremarkable Other: No supplemental non-categorized findings. Musculoskeletal: Upper abdominal ventral hernia mesh. Pelvic floor laxity. Interval repair of umbilical hernia. IMPRESSION: 1. No findings of active or recurrent malignancy. Continued indistinctness of tissue planes in the subcarinal/mid esophageal region. 2. Contrast medium in the esophagus suggesting dysmotility or reflux. 3. Other imaging findings of potential clinical significance: Interval repair of prior umbilical hernia. Collateralized right upper extremity venous structures suggesting possible right subclavian vein stenosis. Aortic Atherosclerosis (ICD10-I70.0) and Emphysema (ICD10-J43.9). Coronary atherosclerosis. Old granulomatous disease. At least partially duplicated right renal collecting system. Scattered colonic diverticula. Upper abdominal ventral hernia mesh. Pelvic floor laxity with  cystocele. Scattered scarring in the lower lobes. Electronically Signed   By: Van Clines M.D.   On: 06/17/2021 08:32      ASSESSMENT & PLAN:  1. Encounter for antineoplastic immunotherapy   2. Small cell lung cancer ([place])   3. Osteopenia, unspecified location   4. Gastroesophageal reflux disease, unspecified whether esophagitis present   5. 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   Currently on Tecentriq maintenance. Clinically she is doing well Labs reviewed and discussed with patient Proceed with Tecentriq maintenance today  #Chronic intermittent headache, MRI brain in May 2022 showed no intracranial brain metastasis. There were acute ischemia changes for which I have asked patient to follow-up with primary care provider.  Patient is taking aspirin, statin for hyperlipidemia.  #Fatigue, normal thyroid function. # History bone metastasis, #Osteopenia  zometa every 4-6 weeks.  continue calcium and vitamin D.  Proceed with Zometa today.   #GERD, on omeprazole, stable symptoms.  Continue Protonix # COPD/emphysema, Stable. Monitor.  Follow up in 3 weeks for next cycle of Tecentriq and Zometa.  Earlie Server, MD, PhD

## 2021-07-21 NOTE — Progress Notes (Signed)
Pt is c/o of a severe headache that started this morning. States it was not that bad this morning but feels like it is getting worse as well as back pain. 4/10.

## 2021-07-21 NOTE — Patient Instructions (Addendum)
Atezolizumab injection What is this medication? ATEZOLIZUMAB (a te zoe LIZ ue mab) is a monoclonal antibody. It is used to treat bladder cancer (urothelial cancer), liver cancer, lung cancer, andmelanoma. This medicine may be used for other purposes; ask your health care provider orpharmacist if you have questions. COMMON BRAND NAME(S): Tecentriq What should I tell my care team before I take this medication? They need to know if you have any of these conditions: autoimmune diseases like Crohn's disease, ulcerative colitis, or lupus have had or planning to have an allogeneic stem cell transplant (uses someone else's stem cells) history of organ transplant history of radiation to the chest nervous system problems like myasthenia gravis or Guillain-Barre syndrome an unusual or allergic reaction to atezolizumab, other medicines, foods, dyes, or preservatives pregnant or trying to get pregnant breast-feeding How should I use this medication? This medicine is for infusion into a vein. It is given by a health careprofessional in a hospital or clinic setting. A special MedGuide will be given to you before each treatment. Be sure to readthis information carefully each time. Talk to your pediatrician regarding the use of this medicine in children.Special care may be needed. Overdosage: If you think you have taken too much of this medicine contact apoison control center or emergency room at once. NOTE: This medicine is only for you. Do not share this medicine with others. What if I miss a dose? It is important not to miss your dose. Call your doctor or health careprofessional if you are unable to keep an appointment. What may interact with this medication? Interactions have not been studied. This list may not describe all possible interactions. Give your health care provider a list of all the medicines, herbs, non-prescription drugs, or dietary supplements you use. Also tell them if you smoke, drink  alcohol, or use illegaldrugs. Some items may interact with your medicine. What should I watch for while using this medication? Your condition will be monitored carefully while you are receiving thismedicine. You may need blood work done while you are taking this medicine. Do not become pregnant while taking this medicine or for at least 5 months after stopping it. Women should inform their doctor if they wish to become pregnant or think they might be pregnant. There is a potential for serious side effects to an unborn child. Talk to your health care professional or pharmacist for more information. Do not breast-feed an infant while taking this medicineor for at least 5 months after the last dose. What side effects may I notice from receiving this medication? Side effects that you should report to your doctor or health care professionalas soon as possible: allergic reactions like skin rash, itching or hives, swelling of the face, lips, or tongue black, tarry stools bloody or watery diarrhea breathing problems changes in vision chest pain or chest tightness chills facial flushing fever headache signs and symptoms of high blood sugar such as dizziness; dry mouth; dry skin; fruity breath; nausea; stomach pain; increased hunger or thirst; increased urination signs and symptoms of liver injury like dark yellow or brown urine; general ill feeling or flu-like symptoms; light-colored stools; loss of appetite; nausea; right upper belly pain; unusually weak or tired; yellowing of the eyes or skin stomach pain trouble passing urine or change in the amount of urine Side effects that usually do not require medical attention (report to yourdoctor or health care professional if they continue or are bothersome): bone pain cough diarrhea joint pain muscle pain muscle weakness swelling  of arms or legs tiredness weight loss This list may not describe all possible side effects. Call your doctor for medical  advice about side effects. You may report side effects to FDA at1-800-FDA-1088. Where should I keep my medication? This drug is given in a hospital or clinic and will not be stored at home. NOTE: This sheet is a summary. It may not cover all possible information. If you have questions about this medicine, talk to your doctor, pharmacist, orhealth care provider.  2022 Elsevier/Gold Standard (2020-09-05 13:59:34) Atezolizumab injection What is this medication? ATEZOLIZUMAB (a te zoe LIZ ue mab) is a monoclonal antibody. It is used to treat bladder cancer (urothelial cancer), liver cancer, lung cancer, andmelanoma. This medicine may be used for other purposes; ask your health care provider orpharmacist if you have questions. COMMON BRAND NAME(S): Tecentriq What should I tell my care team before I take this medication? They need to know if you have any of these conditions: autoimmune diseases like Crohn's disease, ulcerative colitis, or lupus have had or planning to have an allogeneic stem cell transplant (uses someone else's stem cells) history of organ transplant history of radiation to the chest nervous system problems like myasthenia gravis or Guillain-Barre syndrome an unusual or allergic reaction to atezolizumab, other medicines, foods, dyes, or preservatives pregnant or trying to get pregnant breast-feeding How should I use this medication? This medicine is for infusion into a vein. It is given by a health careprofessional in a hospital or clinic setting. A special MedGuide will be given to you before each treatment. Be sure to readthis information carefully each time. Talk to your pediatrician regarding the use of this medicine in children.Special care may be needed. Overdosage: If you think you have taken too much of this medicine contact apoison control center or emergency room at once. NOTE: This medicine is only for you. Do not share this medicine with others. What if I miss a  dose? It is important not to miss your dose. Call your doctor or health careprofessional if you are unable to keep an appointment. What may interact with this medication? Interactions have not been studied. This list may not describe all possible interactions. Give your health care provider a list of all the medicines, herbs, non-prescription drugs, or dietary supplements you use. Also tell them if you smoke, drink alcohol, or use illegaldrugs. Some items may interact with your medicine. What should I watch for while using this medication? Your condition will be monitored carefully while you are receiving thismedicine. You may need blood work done while you are taking this medicine. Do not become pregnant while taking this medicine or for at least 5 months after stopping it. Women should inform their doctor if they wish to become pregnant or think they might be pregnant. There is a potential for serious side effects to an unborn child. Talk to your health care professional or pharmacist for more information. Do not breast-feed an infant while taking this medicineor for at least 5 months after the last dose. What side effects may I notice from receiving this medication? Side effects that you should report to your doctor or health care professionalas soon as possible: allergic reactions like skin rash, itching or hives, swelling of the face, lips, or tongue black, tarry stools bloody or watery diarrhea breathing problems changes in vision chest pain or chest tightness chills facial flushing fever headache signs and symptoms of high blood sugar such as dizziness; dry mouth; dry skin; fruity breath; nausea; stomach  pain; increased hunger or thirst; increased urination signs and symptoms of liver injury like dark yellow or brown urine; general ill feeling or flu-like symptoms; light-colored stools; loss of appetite; nausea; right upper belly pain; unusually weak or tired; yellowing of the eyes or  skin stomach pain trouble passing urine or change in the amount of urine Side effects that usually do not require medical attention (report to yourdoctor or health care professional if they continue or are bothersome): bone pain cough diarrhea joint pain muscle pain muscle weakness swelling of arms or legs tiredness weight loss This list may not describe all possible side effects. Call your doctor for medical advice about side effects. You may report side effects to FDA at1-800-FDA-1088. Where should I keep my medication? This drug is given in a hospital or clinic and will not be stored at home. NOTE: This sheet is a summary. It may not cover all possible information. If you have questions about this medicine, talk to your doctor, pharmacist, orhealth care provider.  2022 Elsevier/Gold Standard (2020-09-05 13:59:34)

## 2021-07-23 ENCOUNTER — Ambulatory Visit: Payer: Medicare Other

## 2021-07-25 ENCOUNTER — Ambulatory Visit: Payer: Medicare Other | Attending: Internal Medicine

## 2021-07-25 ENCOUNTER — Other Ambulatory Visit: Payer: Self-pay

## 2021-07-25 DIAGNOSIS — R2681 Unsteadiness on feet: Secondary | ICD-10-CM | POA: Insufficient documentation

## 2021-07-25 DIAGNOSIS — Z9181 History of falling: Secondary | ICD-10-CM | POA: Insufficient documentation

## 2021-07-25 DIAGNOSIS — M6281 Muscle weakness (generalized): Secondary | ICD-10-CM | POA: Insufficient documentation

## 2021-07-30 ENCOUNTER — Ambulatory Visit: Payer: Medicare Other

## 2021-07-30 ENCOUNTER — Other Ambulatory Visit: Payer: Self-pay

## 2021-07-30 VITALS — BP 104/68 | HR 91

## 2021-07-30 DIAGNOSIS — Z9181 History of falling: Secondary | ICD-10-CM | POA: Diagnosis not present

## 2021-07-30 DIAGNOSIS — M6281 Muscle weakness (generalized): Secondary | ICD-10-CM | POA: Diagnosis not present

## 2021-07-30 DIAGNOSIS — R2681 Unsteadiness on feet: Secondary | ICD-10-CM

## 2021-07-30 NOTE — Therapy (Signed)
[facility] [address] [place], [place], [address] Phone: [phone]   Fax:  [phone]  Physical Therapy Treatment  Patient Details  Name: Kristi [name] MRN: [id number] Date of Birth: 1946-03-22 Referring Provider (PT): Dr. Army Melia   Encounter Date: 07/30/2021   PT End of Session - 07/30/21 1200     Visit Number 6    Number of Visits 25    Date for PT Re-Evaluation 09/25/21    Authorization Type eval: 07/03/21    PT Start Time 1155    PT Stop Time 1230    PT Time Calculation (min) 35 min    Equipment Utilized During Treatment Gait belt    Activity Tolerance Patient tolerated treatment well    Behavior During Therapy [facility] for tasks assessed/performed             Past Medical History:  Diagnosis Date   Acute MI, inferoposterior wall ([place]) 09/30/2014   1 stent   Claustrophobia    Coronary artery disease    GERD (gastroesophageal reflux disease)    Hypercholesteremia    MI, old    Pneumonia    Small cell lung cancer ([place])    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 ([place]) 10/27/2018    Past Surgical History:  Procedure Laterality Date   APPENDECTOMY     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   BLADDER NECK SUSPENSION     CHOLECYSTECTOMY N/A 08/14/2018   Procedure: LAPAROSCOPIC CHOLECYSTECTOMY;  Surgeon: Jules Husbands, MD;  Location: [facility] ORS;  Service: General;  Laterality: N/A;   CHOLECYSTECTOMY  11/2018   CORONARY ANGIOPLASTY  09/29/2014   CORONARY STENT PLACEMENT     ENDOBRONCHIAL ULTRASOUND N/A 10/21/2018   Procedure: ENDOBRONCHIAL ULTRASOUND;  Surgeon: Laverle Hobby, MD;  Location: [facility] ORS;  Service: Pulmonary;  Laterality: N/A;   HERNIA REPAIR  2012   IR FLUORO GUIDE CV LINE RIGHT  12/19/2018   KYPHOPLASTY N/A 03/01/2020   Procedure: T7 KYPHOPLASTY;  Surgeon: Hessie Knows, MD;  Location: [facility] ORS;  Service: Orthopedics;  Laterality: N/A;   LAPAROTOMY     LEFT HEART CATHETERIZATION WITH CORONARY ANGIOGRAM N/A  09/29/2014   Procedure: LEFT HEART CATHETERIZATION WITH CORONARY ANGIOGRAM;  Surgeon: Clent Demark, MD;  Location: [facility] CATH LAB;  Service: Cardiovascular;  Laterality: N/A;   PORTA CATH INSERTION N/A 01/02/2019   Procedure: PORTA CATH INSERTION;  Surgeon: Algernon Huxley, MD;  Location: [facility] CV LAB;  Service: Cardiovascular;  Laterality: N/A;   PORTACATH PLACEMENT Right 10/28/2018   Procedure: INSERTION PORT-A-CATH;  Surgeon: Jules Husbands, MD;  Location: [facility] ORS;  Service: General;  Laterality: Right;   XI ROBOTIC ASSISTED VENTRAL HERNIA N/A 02/27/2021   Procedure: XI ROBOTIC ASSISTED VENTRAL HERNIA (incisional) WITH MESH;  Surgeon: Olean Ree, MD;  Location: [facility] ORS;  Service: General;  Laterality: N/A;    Vitals:   07/30/21 1157  BP: 104/68  Pulse: 91  SpO2: 97%     Subjective Assessment - 07/30/21 1153     Subjective Pt presents without any questions or concerns. She denies any falls since last therapy session. States that she is not feeling great today. She is still having some nausea but no vomiting. Complains of chronic 5/10 thoracic pain.    Pertinent History Pt reports a history of difficulty with her balance since the start of her treatment for her lung cancer. "I'm afraid of falling." She reports 4-5 falls in the last 6 months and during  one fall she injured her L shoulder (skin avulsion). She has a rollator but doesn't use it often. She states that she is more fearful of walking when she uses the rollator. She gets and infusion for her lung cancer every 3 weeks. She has a history of compression fractures in her spine and states that she has had what sounds like a kyphoplasty but still has considerable back pain. She is not involved in any kind of regular exercise program.    Limitations Walking    Patient Stated Goals Decrease fear of falling                  TREATMENT   Ther-ex  NuStep L1-2 x 5 minutes for warm-up during interval history, cues to keep  SPM >50 (3 minutes unbilled); Seated hip flexion marches with red tband around knees 2 x 1 minute; Seated hip abduction with red tband 2 x 1 minute; Seated hip adductor isometric ball squeeze 2 x 1 minute; Standing heel raises with BUE support 2 x 10;   Neuromuscular Re-education  // bars with gait belt and no UE support, CGA-Min A, verbal/contact cueing for proper LE placement and stepping mechanics:   Tandem balance alternating forward LE 2 x 30s each; Tandem walking x 4 lengths; Feet together eyes open/closed x 30s each; Feet together horizontal and vertical head turns x 30s each; Alternating 6 inch hurdle steps forward and backwards x10 each side;   Pt educated throughout session about proper posture and technique with exercises. Improved exercise technique, movement at target joints, use of target muscles after min to mod verbal, visual, tactile cues.    Pt demonstrates excellent motivation during session today.  She arrived late for her session so appointment was abbreviated accordingly. Continued strengthening with patient while providing frequent rest breaks secondary to fatigue.  Repeated balance training with patient today.  Repeated tandem stance as this was challenging for patient during last session.  She struggles with single-leg balance.  She requires regular redirection throughout session.  Will continue to progress strength and balance exercises during follow-up sessions.  Patient encouraged to continue her HEP and follow-up as scheduled.  She will benefit from PT services to address deficits in strength, balance, and mobility in order to return to full function at home with decreased risk for falls.                            PT Short Term Goals - 07/03/21 1223       PT SHORT TERM GOAL #1   Title Pt will be independent with HEP in order to improve strength and balance in order to decrease fall risk and improve function at home and work.    Time 6     Period Weeks    Status New    Target Date 08/14/21               PT Long Term Goals - 07/03/21 1224       PT LONG TERM GOAL #1   Title Pt will improve BERG by at least 3 points in order to demonstrate clinically significant improvement in balance.    Baseline 07/03/21: 41/56    Time 12    Period Weeks    Status New    Target Date 09/25/21      PT LONG TERM GOAL #2   Title Pt will improve FOTO to at least 51 in order to demonstrate  clinically significant improvement in balance and decreased risk for falls.    Baseline 07/03/21: 39    Time 12    Period Weeks    Status New    Target Date 09/25/21      PT LONG TERM GOAL #3   Title Pt will be able to perform sit to stand without UE support in order to demonstrate clinically significant improvement in leg strength and decreased risk for falls    Baseline 07/03/21: unable to perform    Time 12    Period Weeks    Status New    Target Date 09/25/21      PT LONG TERM GOAL #4   Title Pt will improve ABC by at least 13% in order to demonstrate clinically significant improvement in balance confidence.    Baseline 07/03/21: 41.25%    Time 12    Period Weeks    Status New    Target Date 09/25/21                   Plan - 07/30/21 1200     Clinical Impression Statement Pt demonstrates excellent motivation during session today.  She arrived late for her session so appointment was abbreviated accordingly. Continued strengthening with patient while providing frequent rest breaks secondary to fatigue.  Repeated balance training with patient today.  Repeated tandem stance as this was challenging for patient during last session.  She struggles with single-leg balance.  She requires regular redirection throughout session.  Will continue to progress strength and balance exercises during follow-up sessions.  Patient encouraged to continue her HEP and follow-up as scheduled.  She will benefit from PT services to address deficits in  strength, balance, and mobility in order to return to full function at home with decreased risk for falls.    Personal Factors and Comorbidities Age;Comorbidity 3+    Comorbidities Lung cancer, memory deficit, CAD, MI    Examination-Activity Limitations Locomotion Level;Stairs    Examination-Participation Restrictions Community Activity;Laundry;Meal Prep;Shop    Stability/Clinical Decision Making Evolving/Moderate complexity    Rehab Potential Fair    PT Frequency 2x / week    PT Duration 12 weeks    PT Treatment/Interventions ADLs/Self Care Home Management;Aquatic Therapy;Biofeedback;Canalith Repostioning;Cryotherapy;Electrical Stimulation;Iontophoresis 4mg /ml Dexamethasone;Moist Heat;Traction;Ultrasound;DME Instruction;Gait training;Stair training;Functional mobility training;Therapeutic activities;Therapeutic exercise;Balance training;Neuromuscular re-education;Patient/family education;Cognitive remediation;Manual techniques;Passive range of motion;Energy conservation;Dry needling;Vestibular;Spinal Manipulations;Joint Manipulations    PT Next Visit Plan Continue strengthening and balance activities    PT Home Exercise Plan [id number]             Patient will benefit from skilled therapeutic intervention in order to improve the following deficits and impairments:  Decreased balance, Decreased strength, Abnormal gait  Visit Diagnosis: Muscle weakness (generalized)  Unsteadiness on feet     Problem List Patient Active Problem List   Diagnosis Date Noted   MCI (mild cognitive impairment) 06/19/2021   Aortic atherosclerosis ([place]) 06/18/2021   Incisional hernia, without obstruction or gangrene    Thrombocytopenia ([place]) 02/12/2021   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 12/05/2020   Diarrhea 12/05/2020   Osteopenia 08/12/2020   Ventral hernia without obstruction or gangrene 08/12/2020   Thoracic compression fracture, closed, initial encounter ([place]) 01/22/2020   Frequent falls  01/22/2020   Encounter for antineoplastic immunotherapy 09/14/2019   Memory loss 09/14/2019   Other fatigue 09/14/2019   Cancer of hilus of left lung ([place]) 06/08/2019   Dehydration 01/30/2019   Acute pyelonephritis 01/24/2019   Anemia due to antineoplastic chemotherapy 01/09/2019  Encounter for antineoplastic chemotherapy 11/02/2018   Cough 10/28/2018   Dysphagia 10/28/2018   Decrease in appetite 10/28/2018   Goals of care, counseling/discussion 10/28/2018   Small cell lung cancer ([place]) 10/27/2018   Grade I diastolic dysfunction 82/50/5397   Localized edema 08/29/2018   Cholecystitis 08/13/2018   Osteopenia determined by x-ray 05/19/2018   Prediabetes 02/08/2018   Hx of fracture of radius 08/18/2016   Vitamin D deficiency 01/07/2016   Hyperlipidemia 01/02/2016   Hypertension 01/02/2016   Chronic pain 01/02/2016   Obesity (BMI 30.0-34.9) 01/02/2016   Coronary artery disease with stable angina pectoris ([place]) 01/02/2016   IBS (irritable bowel syndrome) 01/02/2016   FH: colon cancer 01/02/2016   DDD (degenerative disc disease), cervical 01/02/2016   Primary osteoarthritis of left knee 01/02/2016   Tobacco use disorder 01/02/2016   Lyndel Safe Deara Bober PT, DPT, GCS  Angelette Ganus 07/31/2021, 11:48 AM  [facility] [address] [place], [place], [address] Phone: [phone]   Fax:  [phone]  Name: Kristi [name] MRN: [id number] Date of Birth: Apr 26, 1946

## 2021-08-01 ENCOUNTER — Ambulatory Visit: Payer: Medicare Other

## 2021-08-01 ENCOUNTER — Other Ambulatory Visit: Payer: Self-pay

## 2021-08-01 DIAGNOSIS — Z9181 History of falling: Secondary | ICD-10-CM

## 2021-08-01 DIAGNOSIS — M6281 Muscle weakness (generalized): Secondary | ICD-10-CM

## 2021-08-01 DIAGNOSIS — R2681 Unsteadiness on feet: Secondary | ICD-10-CM

## 2021-08-01 NOTE — Therapy (Signed)
[facility] [address]. [place], [place], [address] Phone: [phone]   Fax:  [phone]  Physical Therapy Treatment  Patient Details  Name: Kristi [name] MRN: [id number] Date of Birth: [birth year] Referring Provider (PT): Dr. Army Melia   Encounter Date: 08/01/2021   PT End of Session - 08/01/21 1200     Visit Number 7    Number of Visits 25    Date for PT Re-Evaluation 09/25/21    Authorization Type eval: 07/03/21    PT Start Time 1148    PT Stop Time 1230    PT Time Calculation (min) 42 min    Equipment Utilized During Treatment Gait belt    Activity Tolerance Patient tolerated treatment well    Behavior During Therapy [facility] for tasks assessed/performed             Past Medical History:  Diagnosis Date   Acute MI, inferoposterior wall ([place]) 09/30/2014   1 stent   Claustrophobia    Coronary artery disease    GERD (gastroesophageal reflux disease)    Hypercholesteremia    MI, old    Pneumonia    Small cell lung cancer ([place])    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 ([place]) 10/27/2018    Past Surgical History:  Procedure Laterality Date   APPENDECTOMY     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   BLADDER NECK SUSPENSION     CHOLECYSTECTOMY N/A 08/14/2018   Procedure: LAPAROSCOPIC CHOLECYSTECTOMY;  Surgeon: Jules Husbands, MD;  Location: [facility] ORS;  Service: General;  Laterality: N/A;   CHOLECYSTECTOMY  11/2018   CORONARY ANGIOPLASTY  09/29/2014   CORONARY STENT PLACEMENT     ENDOBRONCHIAL ULTRASOUND N/A 10/21/2018   Procedure: ENDOBRONCHIAL ULTRASOUND;  Surgeon: Laverle Hobby, MD;  Location: [facility] ORS;  Service: Pulmonary;  Laterality: N/A;   HERNIA REPAIR  2012   IR FLUORO GUIDE CV LINE RIGHT  12/19/2018   KYPHOPLASTY N/A 03/01/2020   Procedure: T7 KYPHOPLASTY;  Surgeon: Hessie Knows, MD;  Location: [facility] ORS;  Service: Orthopedics;  Laterality: N/A;   LAPAROTOMY     LEFT HEART CATHETERIZATION WITH CORONARY ANGIOGRAM N/A  09/29/2014   Procedure: LEFT HEART CATHETERIZATION WITH CORONARY ANGIOGRAM;  Surgeon: Clent Demark, MD;  Location: [place] CATH LAB;  Service: Cardiovascular;  Laterality: N/A;   PORTA CATH INSERTION N/A 01/02/2019   Procedure: PORTA CATH INSERTION;  Surgeon: Algernon Huxley, MD;  Location: [place] CV LAB;  Service: Cardiovascular;  Laterality: N/A;   PORTACATH PLACEMENT Right 10/28/2018   Procedure: INSERTION PORT-A-CATH;  Surgeon: Jules Husbands, MD;  Location: [facility] ORS;  Service: General;  Laterality: Right;   XI ROBOTIC ASSISTED VENTRAL HERNIA N/A 02/27/2021   Procedure: XI ROBOTIC ASSISTED VENTRAL HERNIA (incisional) WITH MESH;  Surgeon: Olean Ree, MD;  Location: [facility] ORS;  Service: General;  Laterality: N/A;    There were no vitals filed for this visit.    Subjective Assessment - 08/01/21 1159     Subjective Pt presents without any questions or concerns. She denies any falls since last therapy session. Complains of chronic 7/10 thoracic pain.    Pertinent History Pt reports a history of difficulty with her balance since the start of her treatment for her lung cancer. "I'm afraid of falling." She reports 4-5 falls in the last 6 months and during one fall she injured her L shoulder (skin avulsion). She has a rollator but doesn't use it often. She states that she is more  fearful of walking when she uses the rollator. She gets and infusion for her lung cancer every 3 weeks. She has a history of compression fractures in her spine and states that she has had what sounds like a kyphoplasty but still has considerable back pain. She is not involved in any kind of regular exercise program.    Limitations Walking    Patient Stated Goals Decrease fear of falling                  TREATMENT   Ther-ex  NuStep L1-3 x 10 minutes for warm-up during interval history, cues to keep SPM >50, fatigue monitored by therapist, dual task challenge during NuStep which is significantly difficult for  patient (4 minutes unbilled); Seated hip flexion marches with red tband around knees 2 x 1 minute; Seated hip abduction with red tband 2 x 1 minute; Seated hip adductor isometric ball squeeze 2 x 1 minute; Standing heel raises with BUE support x 1 minute; Standing mini squats x 10;   Neuromuscular Re-education  // bars with gait belt and no UE support, CGA-Min A, verbal/contact cueing for proper LE placement and stepping mechanics:   Tandem balance alternating forward LE 2 x 30s each; Tandem walking x 2 lengths; Feet together eyes open/closed x 30s each; Feet together horizontal and vertical head turns x 30s each; Airex feet apart eyes open/closed x30 seconds each; Airex feet together eyes open x30 seconds; Airex alternating 6 inch step taps x10 each;   Pt educated throughout session about proper posture and technique with exercises. Improved exercise technique, movement at target joints, use of target muscles after min to mod verbal, visual, tactile cues.    Pt demonstrates excellent motivation during session today. Continued strengthening with patient while providing frequent rest breaks secondary to fatigue.  Repeated balance training with patient today.  Repeated tandem stance as this was challenging for patient during last session.  Challenged patient today on Airex pad as well as including eyes closed activities.  Patient encouraged to continue her HEP and follow-up as scheduled.  She will benefit from PT services to address deficits in strength, balance, and mobility in order to return to full function at home with decreased risk for falls.                            PT Short Term Goals - 07/03/21 1223       PT SHORT TERM GOAL #1   Title Pt will be independent with HEP in order to improve strength and balance in order to decrease fall risk and improve function at home and work.    Time 6    Period Weeks    Status New    Target Date 08/14/21                PT Long Term Goals - 07/03/21 1224       PT LONG TERM GOAL #1   Title Pt will improve BERG by at least 3 points in order to demonstrate clinically significant improvement in balance.    Baseline 07/03/21: 41/56    Time 12    Period Weeks    Status New    Target Date 09/25/21      PT LONG TERM GOAL #2   Title Pt will improve FOTO to at least 51 in order to demonstrate clinically significant improvement in balance and decreased risk for falls.    Baseline 07/03/21:  39    Time 12    Period Weeks    Status New    Target Date 09/25/21      PT LONG TERM GOAL #3   Title Pt will be able to perform sit to stand without UE support in order to demonstrate clinically significant improvement in leg strength and decreased risk for falls    Baseline 07/03/21: unable to perform    Time 12    Period Weeks    Status New    Target Date 09/25/21      PT LONG TERM GOAL #4   Title Pt will improve ABC by at least 13% in order to demonstrate clinically significant improvement in balance confidence.    Baseline 07/03/21: 41.25%    Time 12    Period Weeks    Status New    Target Date 09/25/21                   Plan - 08/01/21 1200     Clinical Impression Statement Pt demonstrates excellent motivation during session today. Continued strengthening with patient while providing frequent rest breaks secondary to fatigue.  Repeated balance training with patient today.  Repeated tandem stance as this was challenging for patient during last session.  Challenged patient today on Airex pad as well as including eyes closed activities.  Patient encouraged to continue her HEP and follow-up as scheduled.  She will benefit from PT services to address deficits in strength, balance, and mobility in order to return to full function at home with decreased risk for falls.    Personal Factors and Comorbidities Age;Comorbidity 3+    Comorbidities Lung cancer, memory deficit, CAD, MI    Examination-Activity  Limitations Locomotion Level;Stairs    Examination-Participation Restrictions Community Activity;Laundry;Meal Prep;Shop    Stability/Clinical Decision Making Evolving/Moderate complexity    Rehab Potential Fair    PT Frequency 2x / week    PT Duration 12 weeks    PT Treatment/Interventions ADLs/Self Care Home Management;Aquatic Therapy;Biofeedback;Canalith Repostioning;Cryotherapy;Electrical Stimulation;Iontophoresis 4mg /ml Dexamethasone;Moist Heat;Traction;Ultrasound;DME Instruction;Gait training;Stair training;Functional mobility training;Therapeutic activities;Therapeutic exercise;Balance training;Neuromuscular re-education;Patient/family education;Cognitive remediation;Manual techniques;Passive range of motion;Energy conservation;Dry needling;Vestibular;Spinal Manipulations;Joint Manipulations    PT Next Visit Plan Continue strengthening and balance activities    PT Home Exercise Plan [id number]             Patient will benefit from skilled therapeutic intervention in order to improve the following deficits and impairments:  Decreased balance, Decreased strength, Abnormal gait  Visit Diagnosis: Muscle weakness (generalized)  Unsteadiness on feet  History of falling     Problem List Patient Active Problem List   Diagnosis Date Noted   MCI (mild cognitive impairment) 06/19/2021   Aortic atherosclerosis ([place]) 06/18/2021   Incisional hernia, without obstruction or gangrene    Thrombocytopenia ([place]) 02/12/2021   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 12/05/2020   Diarrhea 12/05/2020   Osteopenia 08/12/2020   Ventral hernia without obstruction or gangrene 08/12/2020   Thoracic compression fracture, closed, initial encounter ([place]) 01/22/2020   Frequent falls 01/22/2020   Encounter for antineoplastic immunotherapy 09/14/2019   Memory loss 09/14/2019   Other fatigue 09/14/2019   Cancer of hilus of left lung ([place]) 06/08/2019   Dehydration 01/30/2019   Acute pyelonephritis  01/24/2019   Anemia due to antineoplastic chemotherapy 01/09/2019   Encounter for antineoplastic chemotherapy 11/02/2018   Cough 10/28/2018   Dysphagia 10/28/2018   Decrease in appetite 10/28/2018   Goals of care, counseling/discussion 10/28/2018   Small cell lung cancer (  [place]) 10/27/2018   Grade I diastolic dysfunction 91/69/4503   Localized edema 08/29/2018   Cholecystitis 08/13/2018   Osteopenia determined by x-ray 05/19/2018   Prediabetes 02/08/2018   Hx of fracture of radius 08/18/2016   Vitamin D deficiency 01/07/2016   Hyperlipidemia 01/02/2016   Hypertension 01/02/2016   Chronic pain 01/02/2016   Obesity (BMI 30.0-34.9) 01/02/2016   Coronary artery disease with stable angina pectoris ([place]) 01/02/2016   IBS (irritable bowel syndrome) 01/02/2016   FH: colon cancer 01/02/2016   DDD (degenerative disc disease), cervical 01/02/2016   Primary osteoarthritis of left knee 01/02/2016   Tobacco use disorder 01/02/2016   Lyndel Safe Stormey Wilborn PT, DPT, GCS  Koleton Duchemin 08/01/2021, 2:47 PM  [facility] [address] [place], [place], [address] Phone: [phone]   Fax:  [phone]  Name: REASE SWINSON MRN: [id number] Date of Birth: 03/04/1946

## 2021-08-06 ENCOUNTER — Ambulatory Visit: Payer: Medicare Other

## 2021-08-06 ENCOUNTER — Other Ambulatory Visit: Payer: Self-pay

## 2021-08-06 VITALS — BP 111/63 | HR 80

## 2021-08-06 DIAGNOSIS — R2681 Unsteadiness on feet: Secondary | ICD-10-CM | POA: Diagnosis not present

## 2021-08-06 DIAGNOSIS — M6281 Muscle weakness (generalized): Secondary | ICD-10-CM

## 2021-08-06 DIAGNOSIS — Z9181 History of falling: Secondary | ICD-10-CM | POA: Diagnosis not present

## 2021-08-06 NOTE — Therapy (Signed)
[facility] [address] [place], [place], [address] Phone: [phone]   Fax:  [phone]  Physical Therapy Treatment  Patient Details  Name: Kristi [name] MRN: [id number] Date of Birth: [birth year] Referring Provider (PT): Dr. Army Melia   Encounter Date: 08/06/2021   PT End of Session - 08/06/21 1201     Visit Number 8    Number of Visits 25    Date for PT Re-Evaluation 09/25/21    Authorization Type eval: 07/03/21    PT Start Time 1157    PT Stop Time 1230    PT Time Calculation (min) 33 min    Equipment Utilized During Treatment Gait belt    Activity Tolerance Patient tolerated treatment well    Behavior During Therapy [facility] for tasks assessed/performed              Past Medical History:  Diagnosis Date   Acute MI, inferoposterior wall ([place]) 09/30/2014   1 stent   Claustrophobia    Coronary artery disease    GERD (gastroesophageal reflux disease)    Hypercholesteremia    MI, old    Pneumonia    Small cell lung cancer ([place])    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 ([place]) 10/27/2018    Past Surgical History:  Procedure Laterality Date   APPENDECTOMY     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   BLADDER NECK SUSPENSION     CHOLECYSTECTOMY N/A 08/14/2018   Procedure: LAPAROSCOPIC CHOLECYSTECTOMY;  Surgeon: Jules Husbands, MD;  Location: [facility] ORS;  Service: General;  Laterality: N/A;   CHOLECYSTECTOMY  11/2018   CORONARY ANGIOPLASTY  09/29/2014   CORONARY STENT PLACEMENT     ENDOBRONCHIAL ULTRASOUND N/A 10/21/2018   Procedure: ENDOBRONCHIAL ULTRASOUND;  Surgeon: Laverle Hobby, MD;  Location: [facility] ORS;  Service: Pulmonary;  Laterality: N/A;   HERNIA REPAIR  2012   IR FLUORO GUIDE CV LINE RIGHT  12/19/2018   KYPHOPLASTY N/A 03/01/2020   Procedure: T7 KYPHOPLASTY;  Surgeon: Hessie Knows, MD;  Location: [facility] ORS;  Service: Orthopedics;  Laterality: N/A;   LAPAROTOMY     LEFT HEART CATHETERIZATION WITH CORONARY ANGIOGRAM N/A  09/29/2014   Procedure: LEFT HEART CATHETERIZATION WITH CORONARY ANGIOGRAM;  Surgeon: Clent Demark, MD;  Location: [place] CATH LAB;  Service: Cardiovascular;  Laterality: N/A;   PORTA CATH INSERTION N/A 01/02/2019   Procedure: PORTA CATH INSERTION;  Surgeon: Algernon Huxley, MD;  Location: [place] CV LAB;  Service: Cardiovascular;  Laterality: N/A;   PORTACATH PLACEMENT Right 10/28/2018   Procedure: INSERTION PORT-A-CATH;  Surgeon: Jules Husbands, MD;  Location: [facility] ORS;  Service: General;  Laterality: Right;   XI ROBOTIC ASSISTED VENTRAL HERNIA N/A 02/27/2021   Procedure: XI ROBOTIC ASSISTED VENTRAL HERNIA (incisional) WITH MESH;  Surgeon: Olean Ree, MD;  Location: [facility] ORS;  Service: General;  Laterality: N/A;    Vitals:   08/06/21 1158  BP: 111/63  Pulse: 80  SpO2: 99%      Subjective Assessment - 08/06/21 1200     Subjective Pt presents without any questions upon arrival. She complains of dizziness and nausea which are chronic issues for her. She denies any falls since last therapy session. Complains of chronic 6/10 thoracic pain today.    Pertinent History Pt reports a history of difficulty with her balance since the start of her treatment for her lung cancer. "I'm afraid of falling." She reports 4-5 falls in the last 6 months and during one fall  she injured her L shoulder (skin avulsion). She has a rollator but doesn't use it often. She states that she is more fearful of walking when she uses the rollator. She gets and infusion for her lung cancer every 3 weeks. She has a history of compression fractures in her spine and states that she has had what sounds like a kyphoplasty but still has considerable back pain. She is not involved in any kind of regular exercise program.    Limitations Walking    Patient Stated Goals Decrease fear of falling                  TREATMENT   Ther-ex  Sit to stand without UE assist from regular height chair with Airex pad on seat x 10,  Airex pad removed and performed an additional 10; Seated hip flexion marches with red tband around knees x 1 minute; Seated hip abduction with red tband x 1 minute; Seated hip adductor isometric ball squeeze x 1 minute; Standing heel raises with BUE support x 1 minute; Standing mini squats x 10; NuStep L2 x 5 minutes, cues to keep SPM >50, fatigue monitored by therapist, dual task challenge during NuStep which is significantly difficult for patient (4 minutes unbilled);   Neuromuscular Re-education  // bars with gait belt and no UE support, CGA-Min A, verbal/contact cueing for proper LE placement and stepping mechanics:   Tandem balance alternating forward LE x 30s each; Tandem walking x 2 lengths; Side stepping x 2 lengths;; Airex feet apart eyes open/closed x30 seconds each; Airex feet together eyes open/closed x30 seconds; Airex feet together with horizontal and vertical head turns x 30s; 1/2 foam roll forward/backward stepping x 5 each direction;   Pt educated throughout session about proper posture and technique with exercises. Improved exercise technique, movement at target joints, use of target muscles after min to mod verbal, visual, tactile cues.    Pt demonstrates excellent motivation during session today. She arrived late for her appointment so session was abbreviated accordingly. Continued strengthening with patient while providing frequent rest breaks secondary to fatigue. Repeated balance training with patient today providing progressions for additional challenge. Patient encouraged to continue her HEP and follow-up as scheduled.  She will benefit from PT services to address deficits in strength, balance, and mobility in order to return to full function at home with decreased risk for falls.                            PT Short Term Goals - 07/03/21 1223       PT SHORT TERM GOAL #1   Title Pt will be independent with HEP in order to improve strength  and balance in order to decrease fall risk and improve function at home and work.    Time 6    Period Weeks    Status New    Target Date 08/14/21               PT Long Term Goals - 07/03/21 1224       PT LONG TERM GOAL #1   Title Pt will improve BERG by at least 3 points in order to demonstrate clinically significant improvement in balance.    Baseline 07/03/21: 41/56    Time 12    Period Weeks    Status New    Target Date 09/25/21      PT LONG TERM GOAL #2   Title Pt will  improve FOTO to at least 51 in order to demonstrate clinically significant improvement in balance and decreased risk for falls.    Baseline 07/03/21: 39    Time 12    Period Weeks    Status New    Target Date 09/25/21      PT LONG TERM GOAL #3   Title Pt will be able to perform sit to stand without UE support in order to demonstrate clinically significant improvement in leg strength and decreased risk for falls    Baseline 07/03/21: unable to perform    Time 12    Period Weeks    Status New    Target Date 09/25/21      PT LONG TERM GOAL #4   Title Pt will improve ABC by at least 13% in order to demonstrate clinically significant improvement in balance confidence.    Baseline 07/03/21: 41.25%    Time 12    Period Weeks    Status New    Target Date 09/25/21                   Plan - 08/06/21 1201     Clinical Impression Statement Pt demonstrates excellent motivation during session today. She arrived late for her appointment so session was abbreviated accordingly. Continued strengthening with patient while providing frequent rest breaks secondary to fatigue. Repeated balance training with patient today providing progressions for additional challenge. Patient encouraged to continue her HEP and follow-up as scheduled.  She will benefit from PT services to address deficits in strength, balance, and mobility in order to return to full function at home with decreased risk for falls.    Personal  Factors and Comorbidities Age;Comorbidity 3+    Comorbidities Lung cancer, memory deficit, CAD, MI    Examination-Activity Limitations Locomotion Level;Stairs    Examination-Participation Restrictions Community Activity;Laundry;Meal Prep;Shop    Stability/Clinical Decision Making Evolving/Moderate complexity    Rehab Potential Fair    PT Frequency 2x / week    PT Duration 12 weeks    PT Treatment/Interventions ADLs/Self Care Home Management;Aquatic Therapy;Biofeedback;Canalith Repostioning;Cryotherapy;Electrical Stimulation;Iontophoresis 4mg /ml Dexamethasone;Moist Heat;Traction;Ultrasound;DME Instruction;Gait training;Stair training;Functional mobility training;Therapeutic activities;Therapeutic exercise;Balance training;Neuromuscular re-education;Patient/family education;Cognitive remediation;Manual techniques;Passive range of motion;Energy conservation;Dry needling;Vestibular;Spinal Manipulations;Joint Manipulations    PT Next Visit Plan Continue strengthening and balance activities    PT Home Exercise Plan [email or web address]              Patient will benefit from skilled therapeutic intervention in order to improve the following deficits and impairments:  Decreased balance, Decreased strength, Abnormal gait  Visit Diagnosis: Muscle weakness (generalized)  Unsteadiness on feet     Problem List Patient Active Problem List   Diagnosis Date Noted   MCI (mild cognitive impairment) 06/19/2021   Aortic atherosclerosis ([place]) 06/18/2021   Incisional hernia, without obstruction or gangrene    Thrombocytopenia ([place]) 02/12/2021   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 12/05/2020   Diarrhea 12/05/2020   Osteopenia 08/12/2020   Ventral hernia without obstruction or gangrene 08/12/2020   Thoracic compression fracture, closed, initial encounter ([place]) 01/22/2020   Frequent falls 01/22/2020   Encounter for antineoplastic immunotherapy 09/14/2019   Memory loss 09/14/2019   Other fatigue 09/14/2019    Cancer of hilus of left lung ([place]) 06/08/2019   Dehydration 01/30/2019   Acute pyelonephritis 01/24/2019   Anemia due to antineoplastic chemotherapy 01/09/2019   Encounter for antineoplastic chemotherapy 11/02/2018   Cough 10/28/2018   Dysphagia 10/28/2018   Decrease in appetite 10/28/2018   Goals of  care, counseling/discussion 10/28/2018   Small cell lung cancer ([place]) 10/27/2018   Grade I diastolic dysfunction 81/15/7262   Localized edema 08/29/2018   Cholecystitis 08/13/2018   Osteopenia determined by x-ray 05/19/2018   Prediabetes 02/08/2018   Hx of fracture of radius 08/18/2016   Vitamin D deficiency 01/07/2016   Hyperlipidemia 01/02/2016   Hypertension 01/02/2016   Chronic pain 01/02/2016   Obesity (BMI 30.0-34.9) 01/02/2016   Coronary artery disease with stable angina pectoris ([place]) 01/02/2016   IBS (irritable bowel syndrome) 01/02/2016   FH: colon cancer 01/02/2016   DDD (degenerative disc disease), cervical 01/02/2016   Primary osteoarthritis of left knee 01/02/2016   Tobacco use disorder 01/02/2016   Phillips Grout PT, DPT, GCS  Lakeyta Vandenheuvel 08/06/2021, 1:22 PM  [facility] [address] [place], [place], [address] Phone: [phone]   Fax:  [phone]  Name: Kristi [name] MRN: [id number] Date of Birth: Dec 28, 1945

## 2021-08-08 ENCOUNTER — Other Ambulatory Visit: Payer: Self-pay

## 2021-08-08 ENCOUNTER — Ambulatory Visit: Payer: Medicare Other

## 2021-08-08 DIAGNOSIS — Z9181 History of falling: Secondary | ICD-10-CM | POA: Diagnosis not present

## 2021-08-08 DIAGNOSIS — R2681 Unsteadiness on feet: Secondary | ICD-10-CM

## 2021-08-08 DIAGNOSIS — M6281 Muscle weakness (generalized): Secondary | ICD-10-CM | POA: Diagnosis not present

## 2021-08-08 NOTE — Therapy (Signed)
[facility] [address]. [place], [place], [address] Phone: [phone]   Fax:  [phone]  Physical Therapy Treatment  Patient Details  Name: Kristi [name] MRN: [id number] Date of Birth: 11/09/1946 Referring Provider (PT): Dr. Army Melia   Encounter Date: 08/08/2021   PT End of Session - 08/08/21 1156     Visit Number 9    Number of Visits 25    Date for PT Re-Evaluation 09/25/21    Authorization Type eval: 07/03/21    PT Start Time 2202    PT Stop Time 1230    PT Time Calculation (min) 45 min    Equipment Utilized During Treatment Gait belt    Activity Tolerance Patient tolerated treatment well    Behavior During Therapy [facility] for tasks assessed/performed              Past Medical History:  Diagnosis Date   Acute MI, inferoposterior wall ([place]) 09/30/2014   1 stent   Claustrophobia    Coronary artery disease    GERD (gastroesophageal reflux disease)    Hypercholesteremia    MI, old    Pneumonia    Small cell lung cancer ([place])    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 ([place]) 10/27/2018    Past Surgical History:  Procedure Laterality Date   APPENDECTOMY     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   BLADDER NECK SUSPENSION     CHOLECYSTECTOMY N/A 08/14/2018   Procedure: LAPAROSCOPIC CHOLECYSTECTOMY;  Surgeon: Jules Husbands, MD;  Location: [facility] ORS;  Service: General;  Laterality: N/A;   CHOLECYSTECTOMY  11/2018   CORONARY ANGIOPLASTY  09/29/2014   CORONARY STENT PLACEMENT     ENDOBRONCHIAL ULTRASOUND N/A 10/21/2018   Procedure: ENDOBRONCHIAL ULTRASOUND;  Surgeon: Laverle Hobby, MD;  Location: [facility] ORS;  Service: Pulmonary;  Laterality: N/A;   HERNIA REPAIR  2012   IR FLUORO GUIDE CV LINE RIGHT  12/19/2018   KYPHOPLASTY N/A 03/01/2020   Procedure: T7 KYPHOPLASTY;  Surgeon: Hessie Knows, MD;  Location: [facility] ORS;  Service: Orthopedics;  Laterality: N/A;   LAPAROTOMY     LEFT HEART CATHETERIZATION WITH CORONARY ANGIOGRAM N/A  09/29/2014   Procedure: LEFT HEART CATHETERIZATION WITH CORONARY ANGIOGRAM;  Surgeon: Clent Demark, MD;  Location: [facility] CATH [facility];  Service: Cardiovascular;  Laterality: N/A;   PORTA CATH INSERTION N/A 01/02/2019   Procedure: PORTA CATH INSERTION;  Surgeon: Algernon Huxley, MD;  Location: [facility] CV [facility];  Service: Cardiovascular;  Laterality: N/A;   PORTACATH PLACEMENT Right 10/28/2018   Procedure: INSERTION PORT-A-CATH;  Surgeon: Jules Husbands, MD;  Location: [facility] ORS;  Service: General;  Laterality: Right;   XI ROBOTIC ASSISTED VENTRAL HERNIA N/A 02/27/2021   Procedure: XI ROBOTIC ASSISTED VENTRAL HERNIA (incisional) WITH MESH;  Surgeon: Olean Ree, MD;  Location: [facility] ORS;  Service: General;  Laterality: N/A;    There were no vitals filed for this visit.     Subjective Assessment - 08/08/21 1154     Subjective Pt presents without any questions upon arrival. She reports improvement in the dizziness and nausea which are chronic issues for her. She denies any falls since last therapy session. Complains of chronic 5-6/10 thoracic pain today. No specific questions or concerns currently.    Pertinent History Pt reports a history of difficulty with her balance since the start of her treatment for her lung cancer. "I'm afraid of falling." She reports 4-5 falls in the last 6 months and during one  fall she injured her L shoulder (skin avulsion). She has a rollator but doesn't use it often. She states that she is more fearful of walking when she uses the rollator. She gets and infusion for her lung cancer every 3 weeks. She has a history of compression fractures in her spine and states that she has had what sounds like a kyphoplasty but still has considerable back pain. She is not involved in any kind of regular exercise program.    Limitations Walking    Patient Stated Goals Decrease fear of falling                  TREATMENT   Ther-ex  NuStep L2-3 x 10 minutes, cues to keep SPM >50,  fatigue monitored by therapist and resistance adjusted, dual task challenge during NuStep which is significantly difficult for patient (5 minutes unbilled); Sit to stand without UE assist from regular height chair x 10, x 6 (set set pt only completes 6 reps before she is unable to complete additional reps) Seated hip flexion marches with red tband around knees 2 x 1 minute; Seated hip abduction with red tband 2 x 1 minute; Seated hip adductor isometric ball squeeze 2 x 1 minute; Standing heel raises with BUE support x 1 minute; Standing mini squats x 30s;   Neuromuscular Re-education  // bars with gait belt and no UE support, CGA-Min A, verbal/contact cueing for proper LE placement and stepping mechanics:   6" alternating toe taps x 10 BLE; 6" alternating step-ups x 5 with each leg; Tandem balance alternating forward LE 2 x 30s each; Tandem walking x 2 lengths;   Pt educated throughout session about proper posture and technique with exercises. Improved exercise technique, movement at target joints, use of target muscles after min to mod verbal, visual, tactile cues.    Pt demonstrates excellent motivation during session today. Continued strengthening with patient while providing frequent rest breaks secondary to fatigue. Repeated balance training with patient today providing progressions for additional challenge including toe taps and step-ups. Patient encouraged to continue her HEP and follow-up as scheduled.  She will need updated outcome measures, goals, and a progress note at next visit.  She will benefit from PT services to address deficits in strength, balance, and mobility in order to return to full function at home with decreased risk for falls.                            PT Short Term Goals - 07/03/21 1223       PT SHORT TERM GOAL #1   Title Pt will be independent with HEP in order to improve strength and balance in order to decrease fall risk and improve  function at home and work.    Time 6    Period Weeks    Status New    Target Date 08/14/21               PT Long Term Goals - 07/03/21 1224       PT LONG TERM GOAL #1   Title Pt will improve BERG by at least 3 points in order to demonstrate clinically significant improvement in balance.    Baseline 07/03/21: 41/56    Time 12    Period Weeks    Status New    Target Date 09/25/21      PT LONG TERM GOAL #2   Title Pt will improve FOTO to at  least 51 in order to demonstrate clinically significant improvement in balance and decreased risk for falls.    Baseline 07/03/21: 39    Time 12    Period Weeks    Status New    Target Date 09/25/21      PT LONG TERM GOAL #3   Title Pt will be able to perform sit to stand without UE support in order to demonstrate clinically significant improvement in leg strength and decreased risk for falls    Baseline 07/03/21: unable to perform    Time 12    Period Weeks    Status New    Target Date 09/25/21      PT LONG TERM GOAL #4   Title Pt will improve ABC by at least 13% in order to demonstrate clinically significant improvement in balance confidence.    Baseline 07/03/21: 41.25%    Time 12    Period Weeks    Status New    Target Date 09/25/21                   Plan - 08/08/21 1156     Clinical Impression Statement Pt demonstrates excellent motivation during session today. Continued strengthening with patient while providing frequent rest breaks secondary to fatigue. Repeated balance training with patient today providing progressions for additional challenge including toe taps and step-ups. Patient encouraged to continue her HEP and follow-up as scheduled.  She will need updated outcome measures, goals, and a progress note at next visit.  She will benefit from PT services to address deficits in strength, balance, and mobility in order to return to full function at home with decreased risk for falls. Pt demonstrates excellent  motivation during session today. Continued strengthening with patient while providing frequent rest breaks secondary to fatigue. Repeated balance training with patient today providing progressions for additional challenge including toe taps and step-ups. Patient encouraged to continue her HEP and follow-up as scheduled.  She will need updated outcome measures, goals, and a progress note at next visit.  She will benefit from PT services to address deficits in strength, balance, and mobility in order to return to full function at home with decreased risk for falls.    Personal Factors and Comorbidities Age;Comorbidity 3+    Comorbidities Lung cancer, memory deficit, CAD, MI    Examination-Activity Limitations Locomotion Level;Stairs    Examination-Participation Restrictions Community Activity;Laundry;Meal Prep;Shop    Stability/Clinical Decision Making Evolving/Moderate complexity    Rehab Potential Fair    PT Frequency 2x / week    PT Duration 12 weeks    PT Treatment/Interventions ADLs/Self Care Home Management;Aquatic Therapy;Biofeedback;Canalith Repostioning;Cryotherapy;Electrical Stimulation;Iontophoresis 4mg /ml Dexamethasone;Moist Heat;Traction;Ultrasound;DME Instruction;Gait training;Stair training;Functional mobility training;Therapeutic activities;Therapeutic exercise;Balance training;Neuromuscular re-education;Patient/family education;Cognitive remediation;Manual techniques;Passive range of motion;Energy conservation;Dry needling;Vestibular;Spinal Manipulations;Joint Manipulations    PT Next Visit Plan Update outcome measures and goals, progress note, continue strengthening and balance activities    PT Home Exercise Plan [id number]              Patient will benefit from skilled therapeutic intervention in order to improve the following deficits and impairments:  Decreased balance, Decreased strength, Abnormal gait  Visit Diagnosis: Muscle weakness (generalized)  Unsteadiness on  feet     Problem List Patient Active Problem List   Diagnosis Date Noted   MCI (mild cognitive impairment) 06/19/2021   Aortic atherosclerosis ([place]) 06/18/2021   Incisional hernia, without obstruction or gangrene    Thrombocytopenia ([place]) 02/12/2021   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 12/05/2020  Diarrhea 12/05/2020   Osteopenia 08/12/2020   Ventral hernia without obstruction or gangrene 08/12/2020   Thoracic compression fracture, closed, initial encounter ([place]) 01/22/2020   Frequent falls 01/22/2020   Encounter for antineoplastic immunotherapy 09/14/2019   Memory loss 09/14/2019   Other fatigue 09/14/2019   Cancer of hilus of left lung ([place]) 06/08/2019   Dehydration 01/30/2019   Acute pyelonephritis 01/24/2019   Anemia due to antineoplastic chemotherapy 01/09/2019   Encounter for antineoplastic chemotherapy 11/02/2018   Cough 10/28/2018   Dysphagia 10/28/2018   Decrease in appetite 10/28/2018   Goals of care, counseling/discussion 10/28/2018   Small cell lung cancer ([place]) 10/27/2018   Grade I diastolic dysfunction 01/20/4387   Localized edema 08/29/2018   Cholecystitis 08/13/2018   Osteopenia determined by x-ray 05/19/2018   Prediabetes 02/08/2018   Hx of fracture of radius 08/18/2016   Vitamin D deficiency 01/07/2016   Hyperlipidemia 01/02/2016   Hypertension 01/02/2016   Chronic pain 01/02/2016   Obesity (BMI 30.0-34.9) 01/02/2016   Coronary artery disease with stable angina pectoris ([place]) 01/02/2016   IBS (irritable bowel syndrome) 01/02/2016   FH: colon cancer 01/02/2016   DDD (degenerative disc disease), cervical 01/02/2016   Primary osteoarthritis of left knee 01/02/2016   Tobacco use disorder 01/02/2016   Lyndel Safe Johnny Gorter PT, DPT, GCS  Miriam Liles 08/08/2021, 1:48 PM  [facility] [address]. [place], [place], [address] Phone: [phone]   Fax:  [phone]  Name: DALEYSA KRISTIANSEN MRN:  [id number] Date of Birth: 07-01-1946

## 2021-08-11 ENCOUNTER — Inpatient Hospital Stay: Payer: Medicare Other

## 2021-08-11 ENCOUNTER — Encounter: Payer: Self-pay | Admitting: Oncology

## 2021-08-11 ENCOUNTER — Inpatient Hospital Stay (HOSPITAL_BASED_OUTPATIENT_CLINIC_OR_DEPARTMENT_OTHER): Payer: Medicare Other | Admitting: Oncology

## 2021-08-11 VITALS — BP 125/86 | HR 73 | Temp 96.4°F | Resp 14 | Wt 145.8 lb

## 2021-08-11 DIAGNOSIS — E860 Dehydration: Secondary | ICD-10-CM

## 2021-08-11 DIAGNOSIS — K219 Gastro-esophageal reflux disease without esophagitis: Secondary | ICD-10-CM | POA: Diagnosis not present

## 2021-08-11 DIAGNOSIS — Z5112 Encounter for antineoplastic immunotherapy: Secondary | ICD-10-CM

## 2021-08-11 DIAGNOSIS — C3490 Malignant neoplasm of unspecified part of unspecified bronchus or lung: Secondary | ICD-10-CM | POA: Diagnosis not present

## 2021-08-11 DIAGNOSIS — M8580 Other specified disorders of bone density and structure, unspecified site: Secondary | ICD-10-CM | POA: Diagnosis not present

## 2021-08-11 DIAGNOSIS — I6782 Cerebral ischemia: Secondary | ICD-10-CM

## 2021-08-11 DIAGNOSIS — Z95828 Presence of other vascular implants and grafts: Secondary | ICD-10-CM

## 2021-08-11 DIAGNOSIS — Z87891 Personal history of nicotine dependence: Secondary | ICD-10-CM | POA: Diagnosis not present

## 2021-08-11 DIAGNOSIS — Z8589 Personal history of malignant neoplasm of other organs and systems: Secondary | ICD-10-CM

## 2021-08-11 LAB — COMPREHENSIVE METABOLIC PANEL
ALT: 14 U/L (ref 0–44)
AST: 20 U/L (ref 15–41)
Albumin: 4 g/dL (ref 3.5–5.0)
Alkaline Phosphatase: 50 U/L (ref 38–126)
Anion gap: 8 (ref 5–15)
BUN: 15 mg/dL (ref 8–23)
CO2: 26 mmol/L (ref 22–32)
Calcium: 9.5 mg/dL (ref 8.9–10.3)
Chloride: 102 mmol/L (ref 98–111)
Creatinine, Ser: 0.79 mg/dL (ref 0.44–1.00)
GFR, Estimated: >60 mL/min (ref >60)
Glucose, Bld: 105 mg/dL — ABNORMAL HIGH (ref 70–99)
Potassium: 3.9 mmol/L (ref 3.5–5.1)
Sodium: 136 mmol/L (ref 135–145)
Total Bilirubin: 0.6 mg/dL (ref 0.3–1.2)
Total Protein: 7.2 g/dL (ref 6.5–8.1)

## 2021-08-11 LAB — CBC WITH DIFFERENTIAL/PLATELET
Abs Immature Granulocytes: 0.05 10*3/uL (ref 0.00–0.07)
Basophils Absolute: 0.1 10*3/uL (ref 0.0–0.1)
Basophils Relative: 1 %
Eosinophils Absolute: 0.5 10*3/uL (ref 0.0–0.5)
Eosinophils Relative: 6 %
HCT: 36.4 % (ref 36.0–46.0)
Hemoglobin: 11.8 g/dL — ABNORMAL LOW (ref 12.0–15.0)
Immature Granulocytes: 1 %
Lymphocytes Relative: 24 %
Lymphs Abs: 1.8 10*3/uL (ref 0.7–4.0)
MCH: 29.9 pg (ref 26.0–34.0)
MCHC: 32.4 g/dL (ref 30.0–36.0)
MCV: 92.2 fL (ref 80.0–100.0)
Monocytes Absolute: 0.9 10*3/uL (ref 0.1–1.0)
Monocytes Relative: 12 %
Neutro Abs: 4.3 10*3/uL (ref 1.7–7.7)
Neutrophils Relative %: 56 %
Platelets: 180 10*3/uL (ref 150–400)
RBC: 3.95 MIL/uL (ref 3.87–5.11)
RDW: 13.5 % (ref 11.5–15.5)
WBC: 7.5 10*3/uL (ref 4.0–10.5)
nRBC: 0 % (ref 0.0–0.2)

## 2021-08-11 MED ORDER — SODIUM CHLORIDE 0.9 % IV SOLN
1200.0000 mg | Freq: Once | INTRAVENOUS | Status: AC
  Administered 2021-08-11: 1200 mg via INTRAVENOUS
  Filled 2021-08-11: qty 20

## 2021-08-11 MED ORDER — HEPARIN SOD (PORK) LOCK FLUSH 100 UNIT/ML IV SOLN
INTRAVENOUS | Status: AC
  Filled 2021-08-11: qty 5

## 2021-08-11 MED ORDER — HEPARIN SOD (PORK) LOCK FLUSH 100 UNIT/ML IV SOLN
500.0000 [IU] | Freq: Once | INTRAVENOUS | Status: AC
  Administered 2021-08-11: 500 [IU] via INTRAVENOUS
  Filled 2021-08-11: qty 5

## 2021-08-11 MED ORDER — ZOLEDRONIC ACID 4 MG/100ML IV SOLN
4.0000 mg | Freq: Once | INTRAVENOUS | Status: AC
  Administered 2021-08-11: 4 mg via INTRAVENOUS
  Filled 2021-08-11: qty 100

## 2021-08-11 MED ORDER — SODIUM CHLORIDE 0.9% FLUSH
10.0000 mL | Freq: Once | INTRAVENOUS | Status: AC
  Administered 2021-08-11: 10 mL via INTRAVENOUS
  Filled 2021-08-11: qty 10

## 2021-08-11 MED ORDER — SODIUM CHLORIDE 0.9 % IV SOLN
Freq: Once | INTRAVENOUS | Status: AC
  Filled 2021-08-11: qty 250

## 2021-08-11 MED ORDER — HEPARIN SOD (PORK) LOCK FLUSH 100 UNIT/ML IV SOLN
500.0000 [IU] | Freq: Once | INTRAVENOUS | Status: DC | PRN
  Filled 2021-08-11: qty 5

## 2021-08-11 NOTE — Progress Notes (Signed)
Hematology/Oncology Follow up note [facility] [phone] [phone]   Patient Care Team: Glean Hess, MD as PCP - General (Internal Medicine) Charolette Forward, MD as Consulting Physician (Cardiology) Telford Nab, RN as Registered Nurse [name], MD as Radiation Oncologist (Radiation Oncology)   REASON FOR VISIT:  Follow-up for small cell lung cancer,   HISTORY OF PRESENTING ILLNESS:  Kristi [name] is a  75 y.o.  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  10/20/2018 CT chest with contrast showed large mediastinal mass involving both hilar, left greater than right, consistent with lung carcinoma, The mass causes narrowing of the left mainstem bronchus with resultant volume loss on the left and a mediastinal shift to the left.  Moderate size left pleural effusion. 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  # Initial MRI brain negative.  # Nov 2019- Jan 2020 s/p 4 cycles of Carbo/Etoposide/Tecentriq #  01/24/2019 interim CT scan done which showed continued positive response to therapy with continued reduction in mediastinal adenopathy.  No residual measurable left lung mass. CT findings of acute emphysematous cystitis. Urology did not feel that patient has pyelonephritis and recommend treatment with antibiotics because patient's immunocompromise. Patient finished treatment.   # 11/02/2018-01/09/2019 Chemotherapy carboplatin + Etoposide + tecentriq # Consolidation chest radiation and whole brain radiation finished in May 2020 # 04/25/2019 resume Tecentriq every 3 weeks. # Patient had a fall from her stairs on 01/19/2020. In the emergency room she had CT chest abdomen pelvis CT, CT head without contrast, CT cervical spine without contrast. No CT evidence for acute intracranial abnormality, degenerative  changes of cervical spine..  No CT evidence of acute thoracic abdomen injury.  Severe compression fracture of T7, subacute.  # kyphoplasty procedure on 03/01/2020.  T7 biopsy negative for cancer.  11/20/2020 Surveillance CT scan  Reduced size and prominence of right lateral lung base subpleural nodular density. No findings of active malignancy.  MRI brain showed stable post treatment brain. No metastatic disease.    INTERVAL HISTORY Kristi [name] is a 75 y.o. female who has above history reviewed by me today presents for evaluation prior to immunotherapy for treatment of extensive small cell lung cancer. Patient is on immunotherapy maintenance. Reports doing well except intermittent chronic headache.  Denies any nausea vomiting diarrhea, no intentional weight loss, shortness of breath. She takes her depression medications.  Review of Systems  Constitutional:  Positive for fatigue. Negative for appetite change, chills and fever.  HENT:   Negative for hearing loss and voice change.   Eyes:  Negative for eye problems.  Respiratory:  Negative for chest tightness, cough and shortness of breath.   Cardiovascular:  Negative for chest pain.  Gastrointestinal:  Negative for abdominal distention, abdominal pain, blood in stool, diarrhea and nausea.  Endocrine: Negative for hot flashes.  Genitourinary:  Negative for difficulty urinating and frequency.   Musculoskeletal:  Positive for back pain. Negative for arthralgias.  Skin:  Negative for itching and rash.  Neurological:  Positive for headaches. Negative for extremity weakness.  Hematological:  Negative for adenopathy.  Psychiatric/Behavioral:  Negative for confusion and depression. The patient is not nervous/anxious.        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[place]) 09/30/2014   1 stent   Claustrophobia    Coronary  artery disease    GERD (gastroesophageal reflux disease)     Hypercholesteremia    MI, old    Pneumonia    Small cell lung cancer ([place])    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 ([place]) 10/27/2018    SURGICAL HISTORY: Past Surgical History:  Procedure Laterality Date   APPENDECTOMY     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   BLADDER NECK SUSPENSION     CHOLECYSTECTOMY N/A 08/14/2018   Procedure: LAPAROSCOPIC CHOLECYSTECTOMY;  Surgeon: Jules Husbands, MD;  Location: [facility] ORS;  Service: General;  Laterality: N/A;   CHOLECYSTECTOMY  11/2018   CORONARY ANGIOPLASTY  09/29/2014   CORONARY STENT PLACEMENT     ENDOBRONCHIAL ULTRASOUND N/A 10/21/2018   Procedure: ENDOBRONCHIAL ULTRASOUND;  Surgeon: Laverle Hobby, MD;  Location: [facility] ORS;  Service: Pulmonary;  Laterality: N/A;   HERNIA REPAIR  2012   IR FLUORO GUIDE CV LINE RIGHT  12/19/2018   KYPHOPLASTY N/A 03/01/2020   Procedure: T7 KYPHOPLASTY;  Surgeon: Hessie Knows, MD;  Location: [facility] ORS;  Service: Orthopedics;  Laterality: N/A;   LAPAROTOMY     LEFT HEART CATHETERIZATION WITH CORONARY ANGIOGRAM N/A 09/29/2014   Procedure: LEFT HEART CATHETERIZATION WITH CORONARY ANGIOGRAM;  Surgeon: Clent Demark, MD;  Location: [place] [facility];  Service: Cardiovascular;  Laterality: N/A;   PORTA CATH INSERTION N/A 01/02/2019   Procedure: PORTA CATH INSERTION;  Surgeon: Algernon Huxley, MD;  Location: [place] [facility];  Service: Cardiovascular;  Laterality: N/A;   PORTACATH PLACEMENT Right 10/28/2018   Procedure: INSERTION PORT-A-CATH;  Surgeon: Jules Husbands, MD;  Location: [facility] ORS;  Service: General;  Laterality: Right;   XI ROBOTIC ASSISTED VENTRAL HERNIA N/A 02/27/2021   Procedure: XI ROBOTIC ASSISTED VENTRAL HERNIA (incisional) WITH MESH;  Surgeon: Olean Ree, MD;  Location: [facility] ORS;  Service: General;  Laterality: N/A;    SOCIAL HISTORY: Social History   Socioeconomic History   Marital status: Married    Spouse name: [name]    Number of children: 3   Years of education: Not on file   Highest education  level: Not on file  Occupational History   Occupation: Retired    Comment: Chief Technology Officer   Tobacco Use   Smoking status: Former    Packs/day: 1.00    Years: 39.00    Pack years: 39.00    Types: Cigarettes    Start date: 12/21/1978    Quit date: 10/16/2018    Years since quitting: 2.8   Smokeless tobacco: Never  Vaping Use   Vaping Use: Never used  Substance and Sexual Activity   Alcohol use: Yes    Alcohol/week: 0.0 standard drinks    Comment: occassional - approx 1 every 2 weeks    Drug use: No   Sexual activity: Yes    Birth control/protection: None  Other Topics Concern   Not on file  Social History Narrative   Not on file   Social Determinants of Health   Financial Resource Strain: Not on file  Food Insecurity: Not on file  Transportation Needs: Not on file  Physical Activity: Not on file  Stress: Not on file  Social Connections: Not on file  Intimate Partner Violence: Not on file    FAMILY HISTORY: Family History  Problem Relation Age of Onset   Alzheimer's disease Mother    Colon cancer Father    Breast cancer Neg Hx     ALLERGIES:  has No Known Allergies.  MEDICATIONS:  Current Outpatient Medications  Medication Sig Dispense Refill   acetaminophen (TYLENOL) 500 MG tablet Take 2 tablets (1,000 mg total) by mouth every 6 (six) hours as needed for mild pain or headache.     aspirin EC 81 MG tablet Take 1 tablet (81 mg total) by mouth daily. Swallow whole. 30 tablet 0   atorvastatin (LIPITOR) 10 MG tablet Take 1 tablet (10 mg total) by mouth daily. 90 tablet 3   Calcium 600-200 MG-UNIT tablet Take 1 tablet by mouth 2 (two) times daily.     Cholecalciferol (DIALYVITE VITAMIN D 5000) 125 MCG (5000 UT) capsule Take 5,000 Units by mouth daily.     lidocaine-prilocaine (EMLA) cream Apply 1 application topically daily as needed (port access). 30 g 2   Magnesium 250 MG TABS Take 250 mg by mouth daily.     oxyCODONE (ROXICODONE) 5 MG immediate release tablet Take 1  tablet (5 mg total) by mouth every 6 (six) hours as needed for severe pain. 30 tablet 0   pantoprazole (PROTONIX) 40 MG tablet Take 1 tablet (40 mg total) by mouth 2 (two) times daily. 60 tablet 0   sertraline (ZOLOFT) 100 MG tablet Take 1 tablet (100 mg total) by mouth daily. 90 tablet 3   sucralfate (CARAFATE) 1 g tablet Take 1 g by mouth 3 (three) times daily as needed (heartburn).     traZODone (DESYREL) 50 MG tablet Take 1 tablet (50 mg total) by mouth at bedtime as needed for sleep. 30 tablet 2   vitamin B-12 (CYANOCOBALAMIN) 1000 MCG tablet Take 1,000 mcg by mouth daily.     prochlorperazine (COMPAZINE) 10 MG tablet Take 10 mg by mouth every 6 (six) hours as needed for nausea or vomiting. (Patient not taking: No sig reported)     No current facility-administered medications for this visit.   Facility-Administered Medications Ordered in Other Visits  Medication Dose Route Frequency Provider Last Rate Last Admin   heparin lock flush 100 unit/mL  500 Units Intravenous Once Earlie Server, MD       heparin lock flush 100 unit/mL  500 Units Intravenous Once Earlie Server, MD       heparin lock flush 100 unit/mL  500 Units Intracatheter Once PRN Earlie Server, MD       sodium chloride flush (NS) 0.9 % injection 10 mL  10 mL Intravenous PRN Earlie Server, MD   10 mL at 01/09/19 0820   sodium chloride flush (NS) 0.9 % injection 10 mL  10 mL Intravenous PRN Earlie Server, MD   10 mL at 01/10/19 1400     PHYSICAL EXAMINATION: ECOG PERFORMANCE STATUS: 1 - Symptomatic but completely ambulatory Vitals:   08/11/21 0836  BP: 125/86  Pulse: 73  Resp: 14  Temp: (!) 96.4 F (35.8 C)  SpO2: 98%   Filed Weights   08/11/21 0836  Weight: 145 lb 12.8 oz (66.1 kg)   Physical Examination Today's Vitals   08/11/21 0836  BP: 125/86  Pulse: 73  Resp: 14  Temp: (!) 96.4 F (35.8 C)  SpO2: 98%  Weight: 145 lb 12.8 oz (66.1 kg)  PainSc: 0-No pain   Body mass index is 22.84 kg/m.   Physical Exam Constitutional:       General: She is not in acute distress.    Appearance: She is not diaphoretic.     Comments: Patient walks independently  HENT:     Head: Normocephalic and atraumatic.     Nose: Nose normal.     Mouth/Throat:  Pharynx: No oropharyngeal exudate.  Eyes:     General: No scleral icterus.    Pupils: Pupils are equal, round, and reactive to light.  Cardiovascular:     Rate and Rhythm: Normal rate and regular rhythm.     Heart sounds: No murmur heard. Pulmonary:     Effort: Pulmonary effort is normal. No respiratory distress.     Breath sounds: No wheezing or rales.     Comments: Decreased breath sounds bilaterally.  Abdominal:     General: There is no distension.     Palpations: Abdomen is soft.     Tenderness: There is no abdominal tenderness.  Musculoskeletal:        General: Normal range of motion.     Cervical back: Normal range of motion and neck supple.  Skin:    General: Skin is warm and dry.     Findings: No erythema.  Neurological:     Mental Status: She is alert and oriented to person, place, and time.     Cranial Nerves: No cranial nerve deficit.     Motor: No abnormal muscle tone.     Coordination: Coordination normal.  Psychiatric:        Mood and Affect: Affect normal.       LABORATORY DATA:  I have reviewed the data as listed Lab Results  Component Value Date   WBC 7.5 08/11/2021   HGB 11.8 (L) 08/11/2021   HCT 36.4 08/11/2021   MCV 92.2 08/11/2021   PLT 180 08/11/2021   Recent Labs    08/12/20 1315 09/02/20 1358 09/23/20 0845 10/14/20 0839 06/24/21 1001 07/21/21 0939 08/11/21 0829  NA 134* 137 138   < > 134* 136 136  K 4.0 3.5 4.0   < > 3.8 3.5 3.9  CL 104 105 106   < > 100 103 102  CO2 23 24 24    < > 25 24 26   GLUCOSE 98 103* 91   < > 94 95 105*  BUN 13 18 14    < > 11 15 15   CREATININE 0.81 0.68 0.75   < > 0.71 0.78 0.79  CALCIUM 8.8* 8.3* 8.6*   < > 9.7 9.0 9.5  GFRNONAA >60 >60 >60   < > >60 >60 >60  GFRAA >60 >60 >60  --   --   --    --   PROT 7.4 6.8 7.0   < > 7.5 6.9 7.2  ALBUMIN 4.0 3.7 3.8   < > 4.0 3.9 4.0  AST 17 14* 17   < > 24 20 20   ALT 12 11 11    < > 13 15 14   ALKPHOS 49 49 47   < > 58 45 50  BILITOT 0.5 0.5 0.6   < > 0.7 0.6 0.6   < > = values in this interval not displayed.     RADIOGRAPHIC STUDIES: I have personally reviewed the radiological images as listed and agreed with the findings in the report.  MR Brain W Wo Contrast  Result Date: 05/20/2021 CLINICAL DATA:  75 year old female with small cell lung cancer status post prophylactic whole brain radiation in June 2020. Restaging. EXAM: MRI HEAD WITHOUT AND WITH CONTRAST TECHNIQUE: Multiplanar, multiecho pulse sequences of the brain and surrounding structures were obtained without and with intravenous contrast. CONTRAST:  74mL GADAVIST GADOBUTROL 1 MMOL/ML IV SOLN COMPARISON:  11/20/2020 brain MRI and earlier. FINDINGS: Brain: There are 2 small foci of white matter restricted diffusion in the anterior  right frontal lobe. The larger is near the frontal horn on series 3, image 30 (series 4, image 30) and the smaller in the anterior subcortical white matter or centrum semi of bowel on image 37 (series 4, image 37). Neither is associated with abnormal enhancement, hemorrhage, or mass effect. Questionable small linear diffusion abnormality also in the right cerebellum on series 3, image 13, although no obvious ADC correlate. No associated enhancement. No other restricted diffusion. No abnormal enhancement identified. No dural thickening. No midline shift, mass effect, evidence of mass lesion, ventriculomegaly, extra-axial collection or acute intracranial hemorrhage. Cervicomedullary junction and pituitary are within normal limits. Stable cerebral volume since last year. Advanced bilateral white matter T2 and FLAIR hyperintensity redemonstrated, including involvement of the bilateral deep white matter capsules, and likely XRT related. Similar patchy T2 heterogeneity in the  deep gray matter nuclei and brainstem. No chronic cerebral blood products. Vascular: Major intracranial vascular flow voids are stable. Skull and upper cervical spine: Negative visible cervical spine and spinal cord. Visualized bone marrow signal is within normal limits. Sinuses/Orbits: Stable, negative. Other: Trace mastoid air cell fluid is stable. Negative visible scalp soft tissues. IMPRESSION: 1. Two small foci of restricted diffusion in the right frontal lobe white matter do not enhance and are most compatible with acute small vessel ischemia. Questionable third small focus in the right cerebellum also. 2. Otherwise stable signal changes of whole brain radiation. No metastatic disease identified. Electronically Signed   By: Genevie Ann M.D.   On: 05/20/2021 12:08   CT CHEST ABDOMEN PELVIS W CONTRAST  Result Date: 06/17/2021 CLINICAL DATA:  Small cell lung cancer restaging EXAM: CT CHEST, ABDOMEN, AND PELVIS WITH CONTRAST TECHNIQUE: Multidetector CT imaging of the chest, abdomen and pelvis was performed following the standard protocol during bolus administration of intravenous contrast. CONTRAST:  151mL OMNIPAQUE IOHEXOL 300 MG/ML  SOLN COMPARISON:  Multiple exams, including 02/26/2021 FINDINGS: CT CHEST FINDINGS Cardiovascular: Port-A-Cath tip: Cavoatrial junction. Coronary, aortic arch, and branch vessel atherosclerotic vascular disease. Collateralization of right upper extremity venous structures suggesting possible stenosis in the vicinity of the right subclavian vein. Mediastinum/Nodes: Contrast medium in the esophagus suggesting dysmotility or reflux. Roughly stable indistinctness of tissue planes around the midthoracic esophagus and subcarinal lesion, likely therapy related. Lungs/Pleura: Centrilobular emphysema. Old granulomatous disease. Stable peripheral bandlike scarring in the right lower lobe. Stable scarring anteriorly in the left lower lobe. Posterior subpleural density in the left lower lobe is  stable and likely from scarring. Musculoskeletal: Stable chronic upper sternal body fracture with sclerosis along the fracture margins. Stable T7 compression with vertebral augmentation. Stable inferior endplate compression fracture at T4. CT ABDOMEN PELVIS FINDINGS Hepatobiliary: Unremarkable Pancreas: Unremarkable Spleen: Punctate calcification from old granulomatous disease. Adrenals/Urinary Tract: At least partially duplicated right renal collecting system. Adrenal glands unremarkable. Bladder cystocele extends 2.6 cm below the pubococcygeal line. Stomach/Bowel: Scattered colonic diverticula. Vascular/Lymphatic: Aortoiliac atherosclerotic vascular disease. No pathologic adenopathy identified. Reproductive: Unremarkable Other: No supplemental non-categorized findings. Musculoskeletal: Upper abdominal ventral hernia mesh. Pelvic floor laxity. Interval repair of umbilical hernia. IMPRESSION: 1. No findings of active or recurrent malignancy. Continued indistinctness of tissue planes in the subcarinal/mid esophageal region. 2. Contrast medium in the esophagus suggesting dysmotility or reflux. 3. Other imaging findings of potential clinical significance: Interval repair of prior umbilical hernia. Collateralized right upper extremity venous structures suggesting possible right subclavian vein stenosis. Aortic Atherosclerosis (ICD10-I70.0) and Emphysema (ICD10-J43.9). Coronary atherosclerosis. Old granulomatous disease. At least partially duplicated right renal collecting system. Scattered colonic diverticula.  Upper abdominal ventral hernia mesh. Pelvic floor laxity with cystocele. Scattered scarring in the lower lobes. Electronically Signed   By: Van Clines M.D.   On: 06/17/2021 08:32      ASSESSMENT & PLAN:  1. Small cell lung cancer ([place])   2. Encounter for antineoplastic immunotherapy   3. 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 Brain ischemia   5. Gastroesophageal reflux disease, unspecified  whether esophagitis present    #Extensive small cell lung cancer, S/p 4 cycles of Carboplatin, Etoposide and Tecentriq.   Currently on Tecentriq maintenance. Labs are reviewed and discussed with patient. Procedure is Tecentriq maintenance today   #Chronic intermittent headache, MRI brain in May 2022 showed no intracranial brain metastasis. There were acute ischemia changes for which I have asked patient to follow-up with primary care provider.  Patient is taking aspirin, statin for hyperlipidemia.  #Fatigue, normal thyroid function. # History bone metastasis, #Osteopenia  zometa every 4-6 weeks.  continue calcium and vitamin D.  Proceed with Zometa today.   #GERD, on omeprazole, stable symptoms.  Continue Protonix # COPD/emphysema, Stable. Monitor.  Follow up in 3 weeks for next cycle of Tecentriq   Earlie Server, MD, PhD

## 2021-08-14 ENCOUNTER — Other Ambulatory Visit: Payer: Self-pay

## 2021-08-14 ENCOUNTER — Ambulatory Visit: Payer: Medicare Other

## 2021-08-14 VITALS — BP 96/73 | HR 79

## 2021-08-14 DIAGNOSIS — R2681 Unsteadiness on feet: Secondary | ICD-10-CM | POA: Diagnosis not present

## 2021-08-14 DIAGNOSIS — Z9181 History of falling: Secondary | ICD-10-CM | POA: Diagnosis not present

## 2021-08-14 DIAGNOSIS — M6281 Muscle weakness (generalized): Secondary | ICD-10-CM

## 2021-08-14 NOTE — Therapy (Signed)
[facility] [address]. [place], [place], [address] Phone: [phone]   Fax:  [phone]  Physical Therapy Progress Note  Dates of reporting period  07/03/21   to   08/14/21  Patient Details  Name: Kristi [name] MRN: [id number] Date of Birth: [birth year] Referring Provider (PT): Dr. Army Melia   Encounter Date: 08/14/2021   PT End of Session - 08/14/21 1024     Visit Number 10    Number of Visits 25    Date for PT Re-Evaluation 09/25/21    Authorization Type eval: 07/03/21    PT Start Time 1015    PT Stop Time 1100    PT Time Calculation (min) 45 min    Equipment Utilized During Treatment Gait belt    Activity Tolerance Patient tolerated treatment well    Behavior During Therapy [facility] for tasks assessed/performed              Past Medical History:  Diagnosis Date   Acute MI, inferoposterior wall ([place]) 09/30/2014   1 stent   Claustrophobia    Coronary artery disease    GERD (gastroesophageal reflux disease)    Hypercholesteremia    MI, old    Pneumonia    Small cell lung cancer ([place])    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 ([place]) 10/27/2018    Past Surgical History:  Procedure Laterality Date   APPENDECTOMY     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   BLADDER NECK SUSPENSION     CHOLECYSTECTOMY N/A 08/14/2018   Procedure: LAPAROSCOPIC CHOLECYSTECTOMY;  Surgeon: Jules Husbands, MD;  Location: [facility] ORS;  Service: General;  Laterality: N/A;   CHOLECYSTECTOMY  11/2018   CORONARY ANGIOPLASTY  09/29/2014   CORONARY STENT PLACEMENT     ENDOBRONCHIAL ULTRASOUND N/A 10/21/2018   Procedure: ENDOBRONCHIAL ULTRASOUND;  Surgeon: Laverle Hobby, MD;  Location: [facility] ORS;  Service: Pulmonary;  Laterality: N/A;   HERNIA REPAIR  2012   IR FLUORO GUIDE CV LINE RIGHT  12/19/2018   KYPHOPLASTY N/A 03/01/2020   Procedure: T7 KYPHOPLASTY;  Surgeon: Hessie Knows, MD;  Location: [facility] ORS;  Service: Orthopedics;  Laterality: N/A;   LAPAROTOMY      LEFT HEART CATHETERIZATION WITH CORONARY ANGIOGRAM N/A 09/29/2014   Procedure: LEFT HEART CATHETERIZATION WITH CORONARY ANGIOGRAM;  Surgeon: Clent Demark, MD;  Location: [place] CATH LAB;  Service: Cardiovascular;  Laterality: N/A;   PORTA CATH INSERTION N/A 01/02/2019   Procedure: PORTA CATH INSERTION;  Surgeon: Algernon Huxley, MD;  Location: [place] CV LAB;  Service: Cardiovascular;  Laterality: N/A;   PORTACATH PLACEMENT Right 10/28/2018   Procedure: INSERTION PORT-A-CATH;  Surgeon: Jules Husbands, MD;  Location: [facility] ORS;  Service: General;  Laterality: Right;   XI ROBOTIC ASSISTED VENTRAL HERNIA N/A 02/27/2021   Procedure: XI ROBOTIC ASSISTED VENTRAL HERNIA (incisional) WITH MESH;  Surgeon: Olean Ree, MD;  Location: [facility] ORS;  Service: General;  Laterality: N/A;    Vitals:   08/14/21 1021  BP: 96/73  Pulse: 79  SpO2: 99%       Subjective Assessment - 08/14/21 1021     Subjective Pt presents without any questions upon arrival. She denies any falls since last therapy session. Complains of chronic 6/10 thoracic pain today. No specific questions or concerns currently.    Pertinent History Pt reports a history of difficulty with her balance since the start of her treatment for her lung cancer. "I'm afraid of falling." She reports 4-5 falls  in the last 6 months and during one fall she injured her L shoulder (skin avulsion). She has a rollator but doesn't use it often. She states that she is more fearful of walking when she uses the rollator. She gets and infusion for her lung cancer every 3 weeks. She has a history of compression fractures in her spine and states that she has had what sounds like a kyphoplasty but still has considerable back pain. She is not involved in any kind of regular exercise program.    Limitations Walking    Patient Stated Goals Decrease fear of falling                TREATMENT   Ther-ex  Seated hip flexion marches with red tband around knees 2 x 1  minute; Seated hip abduction with red tband 2 x 1 minute; Seated hip adductor isometric ball squeeze 2 x 1 minute; NuStep L2 x 10 minutes, cues to keep SPM >50, fatigue monitored by therapist and resistance adjusted, dual task challenge during NuStep which is significantly difficult for patient (5 minutes unbilled);   Neuromuscular Re-education  Updated outcome measures with patient;   FUNCTIONAL OUTCOME MEASURES    07/03/21 08/14/21 Comments  BERG 41/56 47/56 Fall risk, in need of intervention  TUG 13.6 seconds NT Within acceptable limits  5TSTS Unable to stand without UE support 20.32s Fall risk, in need of intervention  2 Minute Walk Test NT NT    10 Meter Gait Speed Self-selected: 17.5s = 0.42ms; Fastest: 14.3s = 0.70 m/s Self-selected: 15.0s = 0.67 m/s; Fastest: 10.9s = 0.92 m/s Below normative values for full community ambulation  ABC Scale 41.25% 50% Low balance confidence  FOTO 39 50 Predicted improvement to 51     Pt educated throughout session about proper posture and technique with exercises. Improved exercise technique, movement at target joints, use of target muscles after min to mod verbal, visual, tactile cues.    Pt demonstrates excellent motivation during session today. Updated outcome measures and goals with patient during session today.  BERG improved from 41/56 at initial evaluation to 47/56 today.  During the initial evaluation she was unable to perform a sit to stand without upper extremity support and today she was able to complete her 5 Times Sit to Stand in 20.32 seconds. Her 136mait speed and balance confidence as measured on the ABC Scale have both improved. Continued strengthening with patient while providing frequent rest breaks secondary to fatigue. Patient encouraged to continue her HEP and follow-up as scheduled. She will need updated outcome measures, goals, and a progress note at next visit.  She will benefit from PT services to address deficits in  strength, balance, and mobility in order to return to full function at home with decreased risk for falls.                            PT Short Term Goals - 08/14/21 1024       PT SHORT TERM GOAL #1   Title Pt will be independent with HEP in order to improve strength and balance in order to decrease fall risk and improve function at home and work.    Time 6    Period Weeks    Status On-going    Target Date 08/14/21               PT Long Term Goals - 08/14/21 1025  PT LONG TERM GOAL #1   Title Pt will improve BERG by at least 3 points in order to demonstrate clinically significant improvement in balance.    Baseline 07/03/21: 41/56; 08/14/21: 47/56    Time 12    Period Weeks    Status Achieved      PT LONG TERM GOAL #2   Title Pt will improve FOTO to at least 51 in order to demonstrate clinically significant improvement in balance and decreased risk for falls.    Baseline 07/03/21: 39; 08/14/21: 50    Time 12    Period Weeks    Status Partially Met    Target Date 09/25/21      PT LONG TERM GOAL #3   Title Pt will decrease 5TSTS to below 14s in order to demonstrate clinically significant improvement in LE strength.    Baseline 07/03/21: unable to perform; 08/14/21: 20.32s    Time 12    Period Weeks    Status Revised    Target Date 09/25/21      PT LONG TERM GOAL #4   Title Pt will improve ABC by at least 13% in order to demonstrate clinically significant improvement in balance confidence.    Baseline 07/03/21: 41.25%; 08/14/21: 50%    Time 12    Period Weeks    Status Partially Met    Target Date 09/25/21                   Plan - 08/14/21 1024     Clinical Impression Statement Pt demonstrates excellent motivation during session today. Updated outcome measures and goals with patient during session today.  BERG improved from 41/56 at initial evaluation to 47/56 today.  During the initial evaluation she was unable to perform a sit to  stand without upper extremity support and today she was able to complete her 5 Times Sit to Stand in 20.32 seconds. Her 6mgait speed and balance confidence as measured on the ABC Scale have both improved. Continued strengthening with patient while providing frequent rest breaks secondary to fatigue. Patient encouraged to continue her HEP and follow-up as scheduled. She will need updated outcome measures, goals, and a progress note at next visit.  She will benefit from PT services to address deficits in strength, balance, and mobility in order to return to full function at home with decreased risk for falls.    Personal Factors and Comorbidities Age;Comorbidity 3+    Comorbidities Lung cancer, memory deficit, CAD, MI    Examination-Activity Limitations Locomotion Level;Stairs    Examination-Participation Restrictions Community Activity;Laundry;Meal Prep;Shop    Stability/Clinical Decision Making Evolving/Moderate complexity    Rehab Potential Fair    PT Frequency 2x / week    PT Duration 12 weeks    PT Treatment/Interventions ADLs/Self Care Home Management;Aquatic Therapy;Biofeedback;Canalith Repostioning;Cryotherapy;Electrical Stimulation;Iontophoresis 43mml Dexamethasone;Moist Heat;Traction;Ultrasound;DME Instruction;Gait training;Stair training;Functional mobility training;Therapeutic activities;Therapeutic exercise;Balance training;Neuromuscular re-education;Patient/family education;Cognitive remediation;Manual techniques;Passive range of motion;Energy conservation;Dry needling;Vestibular;Spinal Manipulations;Joint Manipulations    PT Next Visit Plan continue strengthening and balance activities    PT Home Exercise Plan [id number]              Patient will benefit from skilled therapeutic intervention in order to improve the following deficits and impairments:  Decreased balance, Decreased strength, Abnormal gait  Visit Diagnosis: Muscle weakness (generalized)  Unsteadiness on  feet     Problem List Patient Active Problem List   Diagnosis Date Noted   MCI (mild cognitive impairment) 06/19/2021   Aortic atherosclerosis ([place]  06/18/2021   Incisional hernia, without obstruction or gangrene    Thrombocytopenia ([place]) 02/12/2021   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 12/05/2020   Diarrhea 12/05/2020   Osteopenia 08/12/2020   Ventral hernia without obstruction or gangrene 08/12/2020   Thoracic compression fracture, closed, initial encounter ([place]) 01/22/2020   Frequent falls 01/22/2020   Encounter for antineoplastic immunotherapy 09/14/2019   Memory loss 09/14/2019   Other fatigue 09/14/2019   Cancer of hilus of left lung ([place]) 06/08/2019   Dehydration 01/30/2019   Acute pyelonephritis 01/24/2019   Anemia due to antineoplastic chemotherapy 01/09/2019   Encounter for antineoplastic chemotherapy 11/02/2018   Cough 10/28/2018   Dysphagia 10/28/2018   Decrease in appetite 10/28/2018   Goals of care, counseling/discussion 10/28/2018   Small cell lung cancer ([place]) 10/27/2018   Grade I diastolic dysfunction 55/97/4163   Localized edema 08/29/2018   Cholecystitis 08/13/2018   Osteopenia determined by x-ray 05/19/2018   Prediabetes 02/08/2018   Hx of fracture of radius 08/18/2016   Vitamin D deficiency 01/07/2016   Hyperlipidemia 01/02/2016   Hypertension 01/02/2016   Chronic pain 01/02/2016   Obesity (BMI 30.0-34.9) 01/02/2016   Coronary artery disease with stable angina pectoris ([place]) 01/02/2016   IBS (irritable bowel syndrome) 01/02/2016   FH: colon cancer 01/02/2016   DDD (degenerative disc disease), cervical 01/02/2016   Primary osteoarthritis of left knee 01/02/2016   Tobacco use disorder 01/02/2016   Phillips Grout PT, DPT, GCS  Tomia Enlow 08/14/2021, 1:13 PM  [facility] [address] [place], [place], [address] Phone: [phone]   Fax:  [phone]  Name: LIYLAH NAJARRO MRN:  [id number] Date of Birth: 1946-09-04

## 2021-08-15 ENCOUNTER — Ambulatory Visit: Payer: Medicare Other

## 2021-08-15 VITALS — BP 90/65 | HR 98

## 2021-08-15 DIAGNOSIS — R2681 Unsteadiness on feet: Secondary | ICD-10-CM | POA: Diagnosis not present

## 2021-08-15 DIAGNOSIS — M6281 Muscle weakness (generalized): Secondary | ICD-10-CM | POA: Diagnosis not present

## 2021-08-15 DIAGNOSIS — Z9181 History of falling: Secondary | ICD-10-CM | POA: Diagnosis not present

## 2021-08-15 NOTE — Therapy (Signed)
[facility] [address]. [place], [place], [address] Phone: [phone]   Fax:  [phone]  Physical Therapy Treatment  Patient Details  Name: Kristi [name] MRN: [id number] Date of Birth: 02-Dec-1946 Referring Provider (PT): Dr. Army Melia   Encounter Date: 08/15/2021   PT End of Session - 08/15/21 1154     Visit Number 11    Number of Visits 25    Date for PT Re-Evaluation 09/25/21    Authorization Type eval: 07/03/21    PT Start Time 2263    PT Stop Time 1230    PT Time Calculation (min) 45 min    Equipment Utilized During Treatment Gait belt    Activity Tolerance Patient tolerated treatment well    Behavior During Therapy [facility] for tasks assessed/performed              Past Medical History:  Diagnosis Date   Acute MI, inferoposterior wall ([place]) 09/30/2014   1 stent   Claustrophobia    Coronary artery disease    GERD (gastroesophageal reflux disease)    Hypercholesteremia    MI, old    Pneumonia    Small cell lung cancer ([place])    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 ([place]) 10/27/2018    Past Surgical History:  Procedure Laterality Date   APPENDECTOMY     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   BLADDER NECK SUSPENSION     CHOLECYSTECTOMY N/A 08/14/2018   Procedure: LAPAROSCOPIC CHOLECYSTECTOMY;  Surgeon: Jules Husbands, MD;  Location: [facility] ORS;  Service: General;  Laterality: N/A;   CHOLECYSTECTOMY  11/2018   CORONARY ANGIOPLASTY  09/29/2014   CORONARY STENT PLACEMENT     ENDOBRONCHIAL ULTRASOUND N/A 10/21/2018   Procedure: ENDOBRONCHIAL ULTRASOUND;  Surgeon: Laverle Hobby, MD;  Location: [facility] ORS;  Service: Pulmonary;  Laterality: N/A;   HERNIA REPAIR  2012   IR FLUORO GUIDE CV LINE RIGHT  12/19/2018   KYPHOPLASTY N/A 03/01/2020   Procedure: T7 KYPHOPLASTY;  Surgeon: Hessie Knows, MD;  Location: [facility] ORS;  Service: Orthopedics;  Laterality: N/A;   LAPAROTOMY     LEFT HEART CATHETERIZATION WITH CORONARY ANGIOGRAM  N/A 09/29/2014   Procedure: LEFT HEART CATHETERIZATION WITH CORONARY ANGIOGRAM;  Surgeon: Clent Demark, MD;  Location: [place] CATH LAB;  Service: Cardiovascular;  Laterality: N/A;   PORTA CATH INSERTION N/A 01/02/2019   Procedure: PORTA CATH INSERTION;  Surgeon: Algernon Huxley, MD;  Location: [place] CV LAB;  Service: Cardiovascular;  Laterality: N/A;   PORTACATH PLACEMENT Right 10/28/2018   Procedure: INSERTION PORT-A-CATH;  Surgeon: Jules Husbands, MD;  Location: [facility] ORS;  Service: General;  Laterality: Right;   XI ROBOTIC ASSISTED VENTRAL HERNIA N/A 02/27/2021   Procedure: XI ROBOTIC ASSISTED VENTRAL HERNIA (incisional) WITH MESH;  Surgeon: Olean Ree, MD;  Location: [facility] ORS;  Service: General;  Laterality: N/A;    Vitals:   08/15/21 1150  BP: 90/65  Pulse: 98  SpO2: 97%       Subjective Assessment - 08/15/21 1154     Subjective Pt presents without any questions upon arrival. She denies any falls since last therapy session. Complains of chronic 7/10 thoracic pain today.  Denies dizziness upon arrival.  No specific questions or concerns currently.    Pertinent History Pt reports a history of difficulty with her balance since the start of her treatment for her lung cancer. "I'm afraid of falling." She reports 4-5 falls in the last 6 months and during one  fall she injured her L shoulder (skin avulsion). She has a rollator but doesn't use it often. She states that she is more fearful of walking when she uses the rollator. She gets and infusion for her lung cancer every 3 weeks. She has a history of compression fractures in her spine and states that she has had what sounds like a kyphoplasty but still has considerable back pain. She is not involved in any kind of regular exercise program.    Limitations Walking    Patient Stated Goals Decrease fear of falling                  TREATMENT   Ther-ex  NuStep L1-3 x 10 minutes, cues to keep SPM >50, fatigue monitored by  therapist and resistance adjusted, dual task challenge during NuStep which is significantly difficult for patient (5 minutes unbilled), vitals after NuStep: BP: 87/63, HR: 95 SpO2: 98%; SLR x 10 BLE, pt reports increase in L hip pain so regressed to hooklying marching; Hooklying hip flexion marches 2 x 1 minute; Hooklying clams with red tband 2 x 1 minute; Hooklying hip adductor isometric ball squeeze 2 x 1 minute; Hooklying bridges 2 x 10; Hooklying  Standing mini squats x 30s; Sit to stand without UE assist from regular height chair x 10, x 6 (set set pt only completes 6 reps before she is unable to complete additional reps)   Pt educated throughout session about proper posture and technique with exercises. Improved exercise technique, movement at target joints, use of target muscles after min to mod verbal, visual, tactile cues.    Pt demonstrates excellent motivation during session today.  However her blood pressure is low at the start of session and drops a little more after warm-up on NuStep. She denies any lightheadedness or dizziness. Reports good PO intake.  Session today focused on mat table strengthening in supine for safety. Encouraged fluid intake during session and afterwards.Information communicated to husband and pt encouraged to seek immediate medical attention if she starts to feel lightheaded or faint over the weekend. Patient encouraged to continue her HEP and follow-up as scheduled. She will benefit from PT services to address deficits in strength, balance, and mobility in order to return to full function at home with decreased risk for falls.                            PT Short Term Goals - 08/14/21 1024       PT SHORT TERM GOAL #1   Title Pt will be independent with HEP in order to improve strength and balance in order to decrease fall risk and improve function at home and work.    Time 6    Period Weeks    Status On-going    Target Date 08/14/21                PT Long Term Goals - 08/14/21 1025       PT LONG TERM GOAL #1   Title Pt will improve BERG by at least 3 points in order to demonstrate clinically significant improvement in balance.    Baseline 07/03/21: 41/56; 08/14/21: 47/56    Time 12    Period Weeks    Status Achieved      PT LONG TERM GOAL #2   Title Pt will improve FOTO to at least 51 in order to demonstrate clinically significant improvement in balance and decreased risk for  falls.    Baseline 07/03/21: 39; 08/14/21: 50    Time 12    Period Weeks    Status Partially Met    Target Date 09/25/21      PT LONG TERM GOAL #3   Title Pt will decrease 5TSTS to below 14s in order to demonstrate clinically significant improvement in LE strength.    Baseline 07/03/21: unable to perform; 08/14/21: 20.32s    Time 12    Period Weeks    Status Revised    Target Date 09/25/21      PT LONG TERM GOAL #4   Title Pt will improve ABC by at least 13% in order to demonstrate clinically significant improvement in balance confidence.    Baseline 07/03/21: 41.25%; 08/14/21: 50%    Time 12    Period Weeks    Status Partially Met    Target Date 09/25/21                   Plan - 08/15/21 1155     Clinical Impression Statement Pt demonstrates excellent motivation during session today.  However her blood pressure is low at the start of session and drops a little more after warm-up on NuStep. She denies any lightheadedness or dizziness. Reports good PO intake.  Session today focused on mat table strengthening in supine for safety. Encouraged fluid intake during session and afterwards.Information communicated to husband and pt encouraged to seek immediate medical attention if she starts to feel lightheaded or faint over the weekend. Patient encouraged to continue her HEP and follow-up as scheduled. She will benefit from PT services to address deficits in strength, balance, and mobility in order to return to full function at home  with decreased risk for falls.    Personal Factors and Comorbidities Age;Comorbidity 3+    Comorbidities Lung cancer, memory deficit, CAD, MI    Examination-Activity Limitations Locomotion Level;Stairs    Examination-Participation Restrictions Community Activity;Laundry;Meal Prep;Shop    Stability/Clinical Decision Making Evolving/Moderate complexity    Rehab Potential Fair    PT Frequency 2x / week    PT Duration 12 weeks    PT Treatment/Interventions ADLs/Self Care Home Management;Aquatic Therapy;Biofeedback;Canalith Repostioning;Cryotherapy;Electrical Stimulation;Iontophoresis 46m/ml Dexamethasone;Moist Heat;Traction;Ultrasound;DME Instruction;Gait training;Stair training;Functional mobility training;Therapeutic activities;Therapeutic exercise;Balance training;Neuromuscular re-education;Patient/family education;Cognitive remediation;Manual techniques;Passive range of motion;Energy conservation;Dry needling;Vestibular;Spinal Manipulations;Joint Manipulations    PT Next Visit Plan continue strengthening and balance activities    PT Home Exercise Plan [email or web address]              Patient will benefit from skilled therapeutic intervention in order to improve the following deficits and impairments:  Decreased balance, Decreased strength, Abnormal gait  Visit Diagnosis: Muscle weakness (generalized)  Unsteadiness on feet     Problem List Patient Active Problem List   Diagnosis Date Noted   MCI (mild cognitive impairment) 06/19/2021   Aortic atherosclerosis ([place] 06/18/2021   Incisional hernia, without obstruction or gangrene    Thrombocytopenia ([place] 02/12/2021   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 12/05/2020   Diarrhea 12/05/2020   Osteopenia 08/12/2020   Ventral hernia without obstruction or gangrene 08/12/2020   Thoracic compression fracture, closed, initial encounter ([place] 01/22/2020   Frequent falls 01/22/2020   Encounter for antineoplastic immunotherapy 09/14/2019   Memory  loss 09/14/2019   Other fatigue 09/14/2019   Cancer of hilus of left lung ([place] 06/08/2019   Dehydration 01/30/2019   Acute pyelonephritis 01/24/2019   Anemia due to antineoplastic chemotherapy 01/09/2019   Encounter for antineoplastic chemotherapy 11/02/2018  Cough 10/28/2018   Dysphagia 10/28/2018   Decrease in appetite 10/28/2018   Goals of care, counseling/discussion 10/28/2018   Small cell lung cancer ([place]) 10/27/2018   Grade I diastolic dysfunction 79/98/7215   Localized edema 08/29/2018   Cholecystitis 08/13/2018   Osteopenia determined by x-ray 05/19/2018   Prediabetes 02/08/2018   Hx of fracture of radius 08/18/2016   Vitamin D deficiency 01/07/2016   Hyperlipidemia 01/02/2016   Hypertension 01/02/2016   Chronic pain 01/02/2016   Obesity (BMI 30.0-34.9) 01/02/2016   Coronary artery disease with stable angina pectoris ([place]) 01/02/2016   IBS (irritable bowel syndrome) 01/02/2016   FH: colon cancer 01/02/2016   DDD (degenerative disc disease), cervical 01/02/2016   Primary osteoarthritis of left knee 01/02/2016   Tobacco use disorder 01/02/2016   Phillips Grout PT, DPT, GCS  Macaria Bias 08/15/2021, 1:56 PM   [facility] [address] [place], [place], [address] Phone: [phone]   Fax:  [phone]  Name: MARKEITA ALICIA MRN: [id number] Date of Birth: [birth year]

## 2021-08-25 ENCOUNTER — Telehealth: Payer: Self-pay | Admitting: Internal Medicine

## 2021-08-25 NOTE — Telephone Encounter (Signed)
Copied from [place] [id number]. Topic: Medicare AWV >> Aug 25, 2021  9:22 AM Cher Nakai R wrote: Reason for CRM:  Left message for patient to call back and schedule Medicare Annual Wellness Visit (AWV) in office.   If unable to come into the office for AWV,  please offer to do virtually or by telephone.  Last AWV: 09/19/2018  Please schedule at anytime with [facility] Health Advisor.  40 minute appointment  Any questions, please contact me at [phone]

## 2021-09-01 ENCOUNTER — Inpatient Hospital Stay (HOSPITAL_BASED_OUTPATIENT_CLINIC_OR_DEPARTMENT_OTHER): Payer: Medicare Other | Admitting: Oncology

## 2021-09-01 ENCOUNTER — Inpatient Hospital Stay: Payer: Medicare Other | Attending: Oncology

## 2021-09-01 ENCOUNTER — Inpatient Hospital Stay: Payer: Medicare Other

## 2021-09-01 ENCOUNTER — Encounter: Payer: Self-pay | Admitting: Oncology

## 2021-09-01 VITALS — BP 114/78 | HR 78 | Temp 97.5°F | Resp 18 | Wt 145.5 lb

## 2021-09-01 DIAGNOSIS — Z95828 Presence of other vascular implants and grafts: Secondary | ICD-10-CM

## 2021-09-01 DIAGNOSIS — Z5112 Encounter for antineoplastic immunotherapy: Secondary | ICD-10-CM | POA: Insufficient documentation

## 2021-09-01 DIAGNOSIS — Z8589 Personal history of malignant neoplasm of other organs and systems: Secondary | ICD-10-CM | POA: Diagnosis not present

## 2021-09-01 DIAGNOSIS — Z79899 Other long term (current) drug therapy: Secondary | ICD-10-CM | POA: Diagnosis not present

## 2021-09-01 DIAGNOSIS — R5383 Other fatigue: Secondary | ICD-10-CM

## 2021-09-01 DIAGNOSIS — Z87891 Personal history of nicotine dependence: Secondary | ICD-10-CM | POA: Diagnosis not present

## 2021-09-01 DIAGNOSIS — C3490 Malignant neoplasm of unspecified part of unspecified bronchus or lung: Secondary | ICD-10-CM | POA: Insufficient documentation

## 2021-09-01 LAB — COMPREHENSIVE METABOLIC PANEL
ALT: 15 U/L (ref 0–44)
AST: 21 U/L (ref 15–41)
Albumin: 4.1 g/dL (ref 3.5–5.0)
Alkaline Phosphatase: 52 U/L (ref 38–126)
Anion gap: 7 (ref 5–15)
BUN: 16 mg/dL (ref 8–23)
CO2: 26 mmol/L (ref 22–32)
Calcium: 9.4 mg/dL (ref 8.9–10.3)
Chloride: 104 mmol/L (ref 98–111)
Creatinine, Ser: 0.74 mg/dL (ref 0.44–1.00)
GFR, Estimated: >60 mL/min (ref >60)
Glucose, Bld: 96 mg/dL (ref 70–99)
Potassium: 3.8 mmol/L (ref 3.5–5.1)
Sodium: 137 mmol/L (ref 135–145)
Total Bilirubin: 0.6 mg/dL (ref 0.3–1.2)
Total Protein: 7.1 g/dL (ref 6.5–8.1)

## 2021-09-01 LAB — CBC WITH DIFFERENTIAL/PLATELET
Abs Immature Granulocytes: 0.05 10*3/uL (ref 0.00–0.07)
Basophils Absolute: 0 10*3/uL (ref 0.0–0.1)
Basophils Relative: 1 %
Eosinophils Absolute: 0.4 10*3/uL (ref 0.0–0.5)
Eosinophils Relative: 6 %
HCT: 35.6 % — ABNORMAL LOW (ref 36.0–46.0)
Hemoglobin: 11.7 g/dL — ABNORMAL LOW (ref 12.0–15.0)
Immature Granulocytes: 1 %
Lymphocytes Relative: 23 %
Lymphs Abs: 1.6 10*3/uL (ref 0.7–4.0)
MCH: 30 pg (ref 26.0–34.0)
MCHC: 32.9 g/dL (ref 30.0–36.0)
MCV: 91.3 fL (ref 80.0–100.0)
Monocytes Absolute: 0.9 10*3/uL (ref 0.1–1.0)
Monocytes Relative: 13 %
Neutro Abs: 3.9 10*3/uL (ref 1.7–7.7)
Neutrophils Relative %: 56 %
Platelets: 194 10*3/uL (ref 150–400)
RBC: 3.9 MIL/uL (ref 3.87–5.11)
RDW: 13.2 % (ref 11.5–15.5)
WBC: 6.9 10*3/uL (ref 4.0–10.5)
nRBC: 0 % (ref 0.0–0.2)

## 2021-09-01 LAB — TSH: TSH: 0.974 u[IU]/mL (ref 0.350–4.500)

## 2021-09-01 LAB — T4, FREE: Free T4: 0.96 ng/dL (ref 0.61–1.12)

## 2021-09-01 MED ORDER — SODIUM CHLORIDE 0.9 % IV SOLN
1200.0000 mg | Freq: Once | INTRAVENOUS | Status: AC
  Administered 2021-09-01: 1200 mg via INTRAVENOUS
  Filled 2021-09-01: qty 20

## 2021-09-01 MED ORDER — SODIUM CHLORIDE 0.9 % IV SOLN
Freq: Once | INTRAVENOUS | Status: AC
  Filled 2021-09-01: qty 250

## 2021-09-01 MED ORDER — HEPARIN SOD (PORK) LOCK FLUSH 100 UNIT/ML IV SOLN
500.0000 [IU] | Freq: Once | INTRAVENOUS | Status: DC | PRN
Start: 2021-09-01 — End: 2021-09-01
  Filled 2021-09-01: qty 5

## 2021-09-01 MED ORDER — HEPARIN SOD (PORK) LOCK FLUSH 100 UNIT/ML IV SOLN
500.0000 [IU] | Freq: Once | INTRAVENOUS | Status: AC
  Administered 2021-09-01: 500 [IU] via INTRAVENOUS
  Filled 2021-09-01: qty 5

## 2021-09-01 MED ORDER — SODIUM CHLORIDE 0.9% FLUSH
10.0000 mL | Freq: Once | INTRAVENOUS | Status: AC
  Administered 2021-09-01: 10 mL via INTRAVENOUS
  Filled 2021-09-01: qty 10

## 2021-09-01 NOTE — Progress Notes (Signed)
Pt here for follow up. No new concerns voiced.   

## 2021-09-01 NOTE — Patient Instructions (Signed)
[facility]  Discharge Instructions: Thank you for choosing [facility] to provide your oncology and hematology care.  If you have a lab appointment with the [facility], please go directly to the [facility] and check in at the registration area.  Wear comfortable clothing and clothing appropriate for easy access to any Portacath or PICC line.   We strive to give you quality time with your provider. You may need to reschedule your appointment if you arrive late (15 or more minutes).  Arriving late affects you and other patients whose appointments are after yours.  Also, if you miss three or more appointments without notifying the office, you may be dismissed from the clinic at the provider's discretion.      For prescription refill requests, have your pharmacy contact our office and allow 72 hours for refills to be completed.    Today you received the following chemotherapy and/or immunotherapy agents: Tecentriq      To help prevent nausea and vomiting after your treatment, we encourage you to take your nausea medication as directed.  BELOW ARE SYMPTOMS THAT SHOULD BE REPORTED IMMEDIATELY: *FEVER GREATER THAN 100.4 F (38 C) OR HIGHER *CHILLS OR SWEATING *NAUSEA AND VOMITING THAT IS NOT CONTROLLED WITH YOUR NAUSEA MEDICATION *UNUSUAL SHORTNESS OF BREATH *UNUSUAL BRUISING OR BLEEDING *URINARY PROBLEMS (pain or burning when urinating, or frequent urination) *BOWEL PROBLEMS (unusual diarrhea, constipation, pain near the anus) TENDERNESS IN MOUTH AND THROAT WITH OR WITHOUT PRESENCE OF ULCERS (sore throat, sores in mouth, or a toothache) UNUSUAL RASH, SWELLING OR PAIN  UNUSUAL VAGINAL DISCHARGE OR ITCHING   Items with * indicate a potential emergency and should be followed up as soon as possible or go to the Emergency Department if any problems should occur.  Please show the CHEMOTHERAPY ALERT CARD or IMMUNOTHERAPY ALERT CARD at check-in  to the Emergency Department and triage nurse.  Should you have questions after your visit or need to cancel or reschedule your appointment, please contact [facility]  [phone] and follow the prompts.  Office hours are 8:00 a.m. to 4:30 p.m. Monday - Friday. Please note that voicemails left after 4:00 p.m. may not be returned until the following business day.  We are closed weekends and major holidays. You have access to a nurse at all times for urgent questions. Please call the main number to the clinic [phone] and follow the prompts.  For any non-urgent questions, you may also contact your provider using MyChart. We now offer e-Visits for anyone 12 and older to request care online for non-urgent symptoms. For details visit [email or web address].   Also download the MyChart app! Go to the app store, search "MyChart", open the app, select [facility], and log in with your MyChart username and password.  Due to Covid, a mask is required upon entering the hospital/clinic. If you do not have a mask, one will be given to you upon arrival. For doctor visits, patients may have 1 support person aged 51 or older with them. For treatment visits, patients cannot have anyone with them due to current Covid guidelines and our immunocompromised population. Atezolizumab injection What is this medication? ATEZOLIZUMAB (a te zoe LIZ ue mab) is a monoclonal antibody. It is used to treat bladder cancer (urothelial cancer), liver cancer, lung cancer, and melanoma. This medicine may be used for other purposes; ask your health care provider or pharmacist if you have questions. COMMON BRAND NAME(S): Tecentriq What should  I tell my care team before I take this medication? They need to know if you have any of these conditions: autoimmune diseases like Crohn's disease, ulcerative colitis, or lupus have had or planning to have an allogeneic stem cell transplant (uses someone  else's stem cells) history of organ transplant history of radiation to the chest nervous system problems like myasthenia gravis or Guillain-Barre syndrome an unusual or allergic reaction to atezolizumab, other medicines, foods, dyes, or preservatives pregnant or trying to get pregnant breast-feeding How should I use this medication? This medicine is for infusion into a vein. It is given by a health care professional in a hospital or clinic setting. A special MedGuide will be given to you before each treatment. Be sure to read this information carefully each time. Talk to your pediatrician regarding the use of this medicine in children. Special care may be needed. Overdosage: If you think you have taken too much of this medicine contact a poison control center or emergency room at once. NOTE: This medicine is only for you. Do not share this medicine with others. What if I miss a dose? It is important not to miss your dose. Call your doctor or health care professional if you are unable to keep an appointment. What may interact with this medication? Interactions have not been studied. This list may not describe all possible interactions. Give your health care provider a list of all the medicines, herbs, non-prescription drugs, or dietary supplements you use. Also tell them if you smoke, drink alcohol, or use illegal drugs. Some items may interact with your medicine. What should I watch for while using this medication? Your condition will be monitored carefully while you are receiving this medicine. You may need blood work done while you are taking this medicine. Do not become pregnant while taking this medicine or for at least 5 months after stopping it. Women should inform their doctor if they wish to become pregnant or think they might be pregnant. There is a potential for serious side effects to an unborn child. Talk to your health care professional or pharmacist for more information. Do not  breast-feed an infant while taking this medicine or for at least 5 months after the last dose. What side effects may I notice from receiving this medication? Side effects that you should report to your doctor or health care professional as soon as possible: allergic reactions like skin rash, itching or hives, swelling of the face, lips, or tongue black, tarry stools bloody or watery diarrhea breathing problems changes in vision chest pain or chest tightness chills facial flushing fever headache signs and symptoms of high blood sugar such as dizziness; dry mouth; dry skin; fruity breath; nausea; stomach pain; increased hunger or thirst; increased urination signs and symptoms of liver injury like dark yellow or brown urine; general ill feeling or flu-like symptoms; light-colored stools; loss of appetite; nausea; right upper belly pain; unusually weak or tired; yellowing of the eyes or skin stomach pain trouble passing urine or change in the amount of urine Side effects that usually do not require medical attention (report to your doctor or health care professional if they continue or are bothersome): bone pain cough diarrhea joint pain muscle pain muscle weakness swelling of arms or legs tiredness weight loss This list may not describe all possible side effects. Call your doctor for medical advice about side effects. You may report side effects to FDA at 1-800-FDA-1088. Where should I keep my medication? This drug is given  in a hospital or clinic and will not be stored at home. NOTE: This sheet is a summary. It may not cover all possible information. If you have questions about this medicine, talk to your doctor, pharmacist, or health care provider.  2022 Elsevier/Gold Standard (2020-09-05 13:59:34)

## 2021-09-01 NOTE — Progress Notes (Signed)
Hematology/Oncology Follow up note [facility] Telephone:(336) [phone] Fax:(336) [phone]   Patient Care Team: Glean Hess, MD as PCP - General (Internal Medicine) Charolette Forward, MD as Consulting Physician (Cardiology) Telford Nab, RN as Registered Nurse [name], MD as Radiation Oncologist (Radiation Oncology)   REASON FOR VISIT:  Follow-up for small cell lung cancer,   HISTORY OF PRESENTING ILLNESS:  Kristi [name] is a  75 y.o.  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  10/20/2018 CT chest with contrast showed large mediastinal mass involving both hilar, left greater than right, consistent with lung carcinoma, The mass causes narrowing of the left mainstem bronchus with resultant volume loss on the left and a mediastinal shift to the left.  Moderate size left pleural effusion. 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  # Initial MRI brain negative.  # Nov 2019- Jan 2020 s/p 4 cycles of Carbo/Etoposide/Tecentriq #  01/24/2019 interim CT scan done which showed continued positive response to therapy with continued reduction in mediastinal adenopathy.  No residual measurable left lung mass. CT findings of acute emphysematous cystitis. Urology did not feel that patient has pyelonephritis and recommend treatment with antibiotics because patient's immunocompromise. Patient finished treatment.   # 11/02/2018-01/09/2019 Chemotherapy carboplatin + Etoposide + tecentriq # Consolidation chest radiation and whole brain radiation finished in May 2020 # 04/25/2019 resume Tecentriq every 3 weeks. # Patient had a fall from her stairs on 01/19/2020. In the emergency room she had CT chest abdomen pelvis CT, CT head without contrast, CT cervical spine without contrast. No CT evidence for acute intracranial abnormality, degenerative  changes of cervical spine..  No CT evidence of acute thoracic abdomen injury.  Severe compression fracture of T7, subacute.  # kyphoplasty procedure on 03/01/2020.  T7 biopsy negative for cancer.  11/20/2020 Surveillance CT scan  Reduced size and prominence of right lateral lung base subpleural nodular density. No findings of active malignancy.  MRI brain showed stable post treatment brain. No metastatic disease.    INTERVAL HISTORY Kristi [name] is a 75 y.o. female who has above history reviewed by me today presents for evaluation prior to immunotherapy for treatment of extensive small cell lung cancer. Patient is on immunotherapy maintenance. Headache is not frequent recently. She reports no new complaints.   Review of Systems  Constitutional:  Positive for fatigue. Negative for appetite change, chills and fever.  HENT:   Negative for hearing loss and voice change.   Eyes:  Negative for eye problems.  Respiratory:  Negative for chest tightness, cough and shortness of breath.   Cardiovascular:  Negative for chest pain.  Gastrointestinal:  Negative for abdominal distention, abdominal pain, blood in stool, diarrhea and nausea.  Endocrine: Negative for hot flashes.  Genitourinary:  Negative for difficulty urinating and frequency.   Musculoskeletal:  Positive for back pain. Negative for arthralgias.  Skin:  Negative for itching and rash.  Neurological:  Negative for extremity weakness and headaches.  Hematological:  Negative for adenopathy.  Psychiatric/Behavioral:  Negative for confusion and depression. The patient is not nervous/anxious.        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[place]) 09/30/2014   1 stent   Claustrophobia    Coronary artery disease    GERD (gastroesophageal reflux disease)    Hypercholesteremia  MI, old    Pneumonia    Small cell lung cancer ([place])    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 ([place])  10/27/2018    SURGICAL HISTORY: Past Surgical History:  Procedure Laterality Date   APPENDECTOMY     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   BLADDER NECK SUSPENSION     CHOLECYSTECTOMY N/A 08/14/2018   Procedure: LAPAROSCOPIC CHOLECYSTECTOMY;  Surgeon: Jules Husbands, MD;  Location: [facility] ORS;  Service: General;  Laterality: N/A;   CHOLECYSTECTOMY  11/2018   CORONARY ANGIOPLASTY  09/29/2014   CORONARY STENT PLACEMENT     ENDOBRONCHIAL ULTRASOUND N/A 10/21/2018   Procedure: ENDOBRONCHIAL ULTRASOUND;  Surgeon: Laverle Hobby, MD;  Location: [facility] ORS;  Service: Pulmonary;  Laterality: N/A;   HERNIA REPAIR  2012   IR FLUORO GUIDE CV LINE RIGHT  12/19/2018   KYPHOPLASTY N/A 03/01/2020   Procedure: T7 KYPHOPLASTY;  Surgeon: Hessie Knows, MD;  Location: [facility] ORS;  Service: Orthopedics;  Laterality: N/A;   LAPAROTOMY     LEFT HEART CATHETERIZATION WITH CORONARY ANGIOGRAM N/A 09/29/2014   Procedure: LEFT HEART CATHETERIZATION WITH CORONARY ANGIOGRAM;  Surgeon: Clent Demark, MD;  Location: [facility] CATH LAB;  Service: Cardiovascular;  Laterality: N/A;   PORTA CATH INSERTION N/A 01/02/2019   Procedure: PORTA CATH INSERTION;  Surgeon: Algernon Huxley, MD;  Location: [facility] CV LAB;  Service: Cardiovascular;  Laterality: N/A;   PORTACATH PLACEMENT Right 10/28/2018   Procedure: INSERTION PORT-A-CATH;  Surgeon: Jules Husbands, MD;  Location: [facility] ORS;  Service: General;  Laterality: Right;   XI ROBOTIC ASSISTED VENTRAL HERNIA N/A 02/27/2021   Procedure: XI ROBOTIC ASSISTED VENTRAL HERNIA (incisional) WITH MESH;  Surgeon: Olean Ree, MD;  Location: [facility] ORS;  Service: General;  Laterality: N/A;    SOCIAL HISTORY: Social History   Socioeconomic History   Marital status: Married    Spouse name: [name]    Number of children: 3   Years of education: Not on file   Highest education level: Not on file  Occupational History   Occupation: Retired    Comment: Chief Technology Officer   Tobacco Use   Smoking status:  Former    Packs/day: 1.00    Years: 39.00    Pack years: 39.00    Types: Cigarettes    Start date: 12/21/1978    Quit date: 10/16/2018    Years since quitting: 2.8   Smokeless tobacco: Never  Vaping Use   Vaping Use: Never used  Substance and Sexual Activity   Alcohol use: Yes    Alcohol/week: 0.0 standard drinks    Comment: occassional - approx 1 every 2 weeks    Drug use: No   Sexual activity: Yes    Birth control/protection: None  Other Topics Concern   Not on file  Social History Narrative   Not on file   Social Determinants of Health   Financial Resource Strain: Not on file  Food Insecurity: Not on file  Transportation Needs: Not on file  Physical Activity: Not on file  Stress: Not on file  Social Connections: Not on file  Intimate Partner Violence: Not on file    FAMILY HISTORY: Family History  Problem Relation Age of Onset   Alzheimer's disease Mother    Colon cancer Father    Breast cancer Neg Hx     ALLERGIES:  has No Known Allergies.  MEDICATIONS:  Current Outpatient Medications  Medication Sig Dispense Refill   acetaminophen (TYLENOL) 500 MG tablet Take 2 tablets (1,000  mg total) by mouth every 6 (six) hours as needed for mild pain or headache.     aspirin EC 81 MG tablet Take 1 tablet (81 mg total) by mouth daily. Swallow whole. 30 tablet 0   atorvastatin (LIPITOR) 10 MG tablet Take 1 tablet (10 mg total) by mouth daily. 90 tablet 3   Calcium 600-200 MG-UNIT tablet Take 1 tablet by mouth 2 (two) times daily.     Cholecalciferol (DIALYVITE VITAMIN D 5000) 125 MCG (5000 UT) capsule Take 5,000 Units by mouth daily.     lidocaine-prilocaine (EMLA) cream Apply 1 application topically daily as needed (port access). 30 g 2   Magnesium 250 MG TABS Take 250 mg by mouth daily.     oxyCODONE (ROXICODONE) 5 MG immediate release tablet Take 1 tablet (5 mg total) by mouth every 6 (six) hours as needed for severe pain. 30 tablet 0   pantoprazole (PROTONIX) 40 MG  tablet Take 1 tablet (40 mg total) by mouth 2 (two) times daily. 60 tablet 0   prochlorperazine (COMPAZINE) 10 MG tablet Take 10 mg by mouth every 6 (six) hours as needed for nausea or vomiting.     sertraline (ZOLOFT) 100 MG tablet Take 1 tablet (100 mg total) by mouth daily. 90 tablet 3   sucralfate (CARAFATE) 1 g tablet Take 1 g by mouth 3 (three) times daily as needed (heartburn).     traZODone (DESYREL) 50 MG tablet Take 1 tablet (50 mg total) by mouth at bedtime as needed for sleep. 30 tablet 2   vitamin B-12 (CYANOCOBALAMIN) 1000 MCG tablet Take 1,000 mcg by mouth daily.     No current facility-administered medications for this visit.   Facility-Administered Medications Ordered in Other Visits  Medication Dose Route Frequency Provider Last Rate Last Admin   heparin lock flush 100 unit/mL  500 Units Intravenous Once Earlie Server, MD       heparin lock flush 100 unit/mL  500 Units Intravenous Once Earlie Server, MD       heparin lock flush 100 unit/mL  500 Units Intracatheter Once PRN Earlie Server, MD       sodium chloride flush (NS) 0.9 % injection 10 mL  10 mL Intravenous PRN Earlie Server, MD   10 mL at 01/09/19 0820   sodium chloride flush (NS) 0.9 % injection 10 mL  10 mL Intravenous PRN Earlie Server, MD   10 mL at 01/10/19 1400     PHYSICAL EXAMINATION: ECOG PERFORMANCE STATUS: 1 - Symptomatic but completely ambulatory Vitals:   09/01/21 0934  BP: 114/78  Pulse: 78  Resp: 18  Temp: (!) 97.5 F (36.4 C)  SpO2: 97%   Filed Weights   09/01/21 0934  Weight: 145 lb 8 oz (66 kg)   Physical Examination Today's Vitals   09/01/21 0934  BP: 114/78  Pulse: 78  Resp: 18  Temp: (!) 97.5 F (36.4 C)  SpO2: 97%  Weight: 145 lb 8 oz (66 kg)  PainSc: 0-No pain   Body mass index is 22.79 kg/m.   Physical Exam Constitutional:      General: She is not in acute distress.    Appearance: She is not diaphoretic.     Comments: Patient walks independently  HENT:     Head: Normocephalic and  atraumatic.     Nose: Nose normal.     Mouth/Throat:     Pharynx: No oropharyngeal exudate.  Eyes:     General: No scleral icterus.    Pupils:  Pupils are equal, round, and reactive to light.  Cardiovascular:     Rate and Rhythm: Normal rate and regular rhythm.     Heart sounds: No murmur heard. Pulmonary:     Effort: Pulmonary effort is normal. No respiratory distress.     Breath sounds: No wheezing or rales.     Comments: Decreased breath sounds bilaterally.  Abdominal:     General: There is no distension.     Palpations: Abdomen is soft.     Tenderness: There is no abdominal tenderness.  Musculoskeletal:        General: Normal range of motion.     Cervical back: Normal range of motion and neck supple.  Skin:    General: Skin is warm and dry.     Findings: No erythema.  Neurological:     Mental Status: She is alert and oriented to person, place, and time.     Cranial Nerves: No cranial nerve deficit.     Motor: No abnormal muscle tone.     Coordination: Coordination normal.  Psychiatric:        Mood and Affect: Affect normal.       LABORATORY DATA:  I have reviewed the data as listed Lab Results  Component Value Date   WBC 6.9 09/01/2021   HGB 11.7 (L) 09/01/2021   HCT 35.6 (L) 09/01/2021   MCV 91.3 09/01/2021   PLT 194 09/01/2021   Recent Labs    09/02/20 1358 09/23/20 0845 10/14/20 0839 07/21/21 0939 08/11/21 0829 09/01/21 0915  NA 137 138   < > 136 136 137  K 3.5 4.0   < > 3.5 3.9 3.8  CL 105 106   < > 103 102 104  CO2 24 24   < > 24 26 26   GLUCOSE 103* 91   < > 95 105* 96  BUN 18 14   < > 15 15 16   CREATININE 0.68 0.75   < > 0.78 0.79 0.74  CALCIUM 8.3* 8.6*   < > 9.0 9.5 9.4  GFRNONAA >60 >60   < > >60 >60 >60  GFRAA >60 >60  --   --   --   --   PROT 6.8 7.0   < > 6.9 7.2 7.1  ALBUMIN 3.7 3.8   < > 3.9 4.0 4.1  AST 14* 17   < > 20 20 21   ALT 11 11   < > 15 14 15   ALKPHOS 49 47   < > 45 50 52  BILITOT 0.5 0.6   < > 0.6 0.6 0.6   < > = values  in this interval not displayed.     RADIOGRAPHIC STUDIES: I have personally reviewed the radiological images as listed and agreed with the findings in the report.  CT CHEST ABDOMEN PELVIS W CONTRAST  Result Date: 06/17/2021 CLINICAL DATA:  Small cell lung cancer restaging EXAM: CT CHEST, ABDOMEN, AND PELVIS WITH CONTRAST TECHNIQUE: Multidetector CT imaging of the chest, abdomen and pelvis was performed following the standard protocol during bolus administration of intravenous contrast. CONTRAST:  158mL OMNIPAQUE IOHEXOL 300 MG/ML  SOLN COMPARISON:  Multiple exams, including 02/26/2021 FINDINGS: CT CHEST FINDINGS Cardiovascular: Port-A-Cath tip: Cavoatrial junction. Coronary, aortic arch, and branch vessel atherosclerotic vascular disease. Collateralization of right upper extremity venous structures suggesting possible stenosis in the vicinity of the right subclavian vein. Mediastinum/Nodes: Contrast medium in the esophagus suggesting dysmotility or reflux. Roughly stable indistinctness of tissue planes around the midthoracic esophagus and  subcarinal lesion, likely therapy related. Lungs/Pleura: Centrilobular emphysema. Old granulomatous disease. Stable peripheral bandlike scarring in the right lower lobe. Stable scarring anteriorly in the left lower lobe. Posterior subpleural density in the left lower lobe is stable and likely from scarring. Musculoskeletal: Stable chronic upper sternal body fracture with sclerosis along the fracture margins. Stable T7 compression with vertebral augmentation. Stable inferior endplate compression fracture at T4. CT ABDOMEN PELVIS FINDINGS Hepatobiliary: Unremarkable Pancreas: Unremarkable Spleen: Punctate calcification from old granulomatous disease. Adrenals/Urinary Tract: At least partially duplicated right renal collecting system. Adrenal glands unremarkable. Bladder cystocele extends 2.6 cm below the pubococcygeal line. Stomach/Bowel: Scattered colonic diverticula.  Vascular/Lymphatic: Aortoiliac atherosclerotic vascular disease. No pathologic adenopathy identified. Reproductive: Unremarkable Other: No supplemental non-categorized findings. Musculoskeletal: Upper abdominal ventral hernia mesh. Pelvic floor laxity. Interval repair of umbilical hernia. IMPRESSION: 1. No findings of active or recurrent malignancy. Continued indistinctness of tissue planes in the subcarinal/mid esophageal region. 2. Contrast medium in the esophagus suggesting dysmotility or reflux. 3. Other imaging findings of potential clinical significance: Interval repair of prior umbilical hernia. Collateralized right upper extremity venous structures suggesting possible right subclavian vein stenosis. Aortic Atherosclerosis (ICD10-I70.0) and Emphysema (ICD10-J43.9). Coronary atherosclerosis. Old granulomatous disease. At least partially duplicated right renal collecting system. Scattered colonic diverticula. Upper abdominal ventral hernia mesh. Pelvic floor laxity with cystocele. Scattered scarring in the lower lobes. Electronically Signed   By: Van Clines M.D.   On: 06/17/2021 08:32      ASSESSMENT & PLAN:  1. Small cell lung cancer ([place])   2. Encounter for antineoplastic immunotherapy   3. 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 Port-A-Cath in place    #Extensive small cell lung cancer, S/p 4 cycles of Carboplatin, Etoposide and Tecentriq.   Currently on Tecentriq maintenance. Labs are reviewed and discussed with patient. Proceed with Tecentriq maintenance today.    #Chronic intermittent headache, MRI brain in May 2022 showed no intracranial brain metastasis. There were acute ischemia changes for which I have asked patient to follow-up with primary care provider.  Patient is taking aspirin, statin for hyperlipidemia.  #Fatigue, normal thyroid function. # History bone metastasis, #Osteopenia  zometa every 4-6 weeks.  continue calcium and vitamin D.  Zometa at next visit.     #GERD, on omeprazole, stable symptoms.  Continue Protonix # COPD/emphysema, Stable. Monitor.   Follow up in 4 weeks for next cycle of Tecentriq [patient's preference due to schedule conflict]  Earlie Server, MD, PhD

## 2021-09-24 ENCOUNTER — Ambulatory Visit: Payer: Medicare Other | Admitting: Internal Medicine

## 2021-09-29 ENCOUNTER — Inpatient Hospital Stay (HOSPITAL_BASED_OUTPATIENT_CLINIC_OR_DEPARTMENT_OTHER): Payer: Medicare Other | Admitting: Oncology

## 2021-09-29 ENCOUNTER — Encounter: Payer: Self-pay | Admitting: Oncology

## 2021-09-29 ENCOUNTER — Other Ambulatory Visit: Payer: Self-pay

## 2021-09-29 ENCOUNTER — Inpatient Hospital Stay: Payer: Medicare Other | Attending: Oncology

## 2021-09-29 ENCOUNTER — Inpatient Hospital Stay: Payer: Medicare Other

## 2021-09-29 VITALS — BP 111/82 | HR 81 | Temp 97.7°F | Wt 144.6 lb

## 2021-09-29 DIAGNOSIS — Z8589 Personal history of malignant neoplasm of other organs and systems: Secondary | ICD-10-CM | POA: Diagnosis not present

## 2021-09-29 DIAGNOSIS — C3490 Malignant neoplasm of unspecified part of unspecified bronchus or lung: Secondary | ICD-10-CM

## 2021-09-29 DIAGNOSIS — Z23 Encounter for immunization: Secondary | ICD-10-CM | POA: Diagnosis not present

## 2021-09-29 DIAGNOSIS — M8580 Other specified disorders of bone density and structure, unspecified site: Secondary | ICD-10-CM | POA: Diagnosis not present

## 2021-09-29 DIAGNOSIS — I6782 Cerebral ischemia: Secondary | ICD-10-CM | POA: Insufficient documentation

## 2021-09-29 DIAGNOSIS — Z5112 Encounter for antineoplastic immunotherapy: Secondary | ICD-10-CM | POA: Insufficient documentation

## 2021-09-29 DIAGNOSIS — Z87891 Personal history of nicotine dependence: Secondary | ICD-10-CM | POA: Insufficient documentation

## 2021-09-29 DIAGNOSIS — E860 Dehydration: Secondary | ICD-10-CM

## 2021-09-29 LAB — COMPREHENSIVE METABOLIC PANEL
ALT: 16 U/L (ref 0–44)
AST: 22 U/L (ref 15–41)
Albumin: 4 g/dL (ref 3.5–5.0)
Alkaline Phosphatase: 49 U/L (ref 38–126)
Anion gap: 6 (ref 5–15)
BUN: 17 mg/dL (ref 8–23)
CO2: 27 mmol/L (ref 22–32)
Calcium: 9.2 mg/dL (ref 8.9–10.3)
Chloride: 101 mmol/L (ref 98–111)
Creatinine, Ser: 0.79 mg/dL (ref 0.44–1.00)
GFR, Estimated: >60 mL/min (ref >60)
Glucose, Bld: 99 mg/dL (ref 70–99)
Potassium: 3.9 mmol/L (ref 3.5–5.1)
Sodium: 134 mmol/L — ABNORMAL LOW (ref 135–145)
Total Bilirubin: 0.3 mg/dL (ref 0.3–1.2)
Total Protein: 7.4 g/dL (ref 6.5–8.1)

## 2021-09-29 LAB — CBC WITH DIFFERENTIAL/PLATELET
Abs Immature Granulocytes: 0.05 10*3/uL (ref 0.00–0.07)
Basophils Absolute: 0.1 10*3/uL (ref 0.0–0.1)
Basophils Relative: 1 %
Eosinophils Absolute: 0.4 10*3/uL (ref 0.0–0.5)
Eosinophils Relative: 6 %
HCT: 36.9 % (ref 36.0–46.0)
Hemoglobin: 12.5 g/dL (ref 12.0–15.0)
Immature Granulocytes: 1 %
Lymphocytes Relative: 24 %
Lymphs Abs: 1.7 10*3/uL (ref 0.7–4.0)
MCH: 30.1 pg (ref 26.0–34.0)
MCHC: 33.9 g/dL (ref 30.0–36.0)
MCV: 88.9 fL (ref 80.0–100.0)
Monocytes Absolute: 1 10*3/uL (ref 0.1–1.0)
Monocytes Relative: 13 %
Neutro Abs: 4.1 10*3/uL (ref 1.7–7.7)
Neutrophils Relative %: 55 %
Platelets: 207 10*3/uL (ref 150–400)
RBC: 4.15 MIL/uL (ref 3.87–5.11)
RDW: 13 % (ref 11.5–15.5)
WBC: 7.3 10*3/uL (ref 4.0–10.5)
nRBC: 0 % (ref 0.0–0.2)

## 2021-09-29 MED ORDER — ZOLEDRONIC ACID 4 MG/100ML IV SOLN
4.0000 mg | Freq: Once | INTRAVENOUS | Status: AC
  Administered 2021-09-29: 4 mg via INTRAVENOUS
  Filled 2021-09-29: qty 100

## 2021-09-29 MED ORDER — SODIUM CHLORIDE 0.9 % IV SOLN
1200.0000 mg | Freq: Once | INTRAVENOUS | Status: AC
  Administered 2021-09-29: 1200 mg via INTRAVENOUS
  Filled 2021-09-29: qty 20

## 2021-09-29 MED ORDER — HEPARIN SOD (PORK) LOCK FLUSH 100 UNIT/ML IV SOLN
INTRAVENOUS | Status: AC
  Administered 2021-09-29: 500 [IU]
  Filled 2021-09-29: qty 5

## 2021-09-29 MED ORDER — HEPARIN SOD (PORK) LOCK FLUSH 100 UNIT/ML IV SOLN
500.0000 [IU] | Freq: Once | INTRAVENOUS | Status: AC | PRN
  Filled 2021-09-29: qty 5

## 2021-09-29 MED ORDER — SODIUM CHLORIDE 0.9% FLUSH
10.0000 mL | INTRAVENOUS | Status: DC | PRN
Start: 2021-09-29 — End: 2021-09-29
  Filled 2021-09-29: qty 10

## 2021-09-29 MED ORDER — SODIUM CHLORIDE 0.9 % IV SOLN
Freq: Once | INTRAVENOUS | Status: AC
  Filled 2021-09-29: qty 250

## 2021-09-29 NOTE — Patient Instructions (Signed)
[facility]  Discharge Instructions: Thank you for choosing [facility] to provide your oncology and hematology care.  If you have a lab appointment with the [facility], please go directly to the [facility] and check in at the registration area.  Wear comfortable clothing and clothing appropriate for easy access to any Portacath or PICC line.   We strive to give you quality time with your provider. You may need to reschedule your appointment if you arrive late (15 or more minutes).  Arriving late affects you and other patients whose appointments are after yours.  Also, if you miss three or more appointments without notifying the office, you may be dismissed from the clinic at the provider's discretion.      For prescription refill requests, have your pharmacy contact our office and allow 72 hours for refills to be completed.    Today you received the following chemotherapy and/or immunotherapy agents tecentriq, zometa      To help prevent nausea and vomiting after your treatment, we encourage you to take your nausea medication as directed.  BELOW ARE SYMPTOMS THAT SHOULD BE REPORTED IMMEDIATELY: *FEVER GREATER THAN 100.4 F (38 C) OR HIGHER *CHILLS OR SWEATING *NAUSEA AND VOMITING THAT IS NOT CONTROLLED WITH YOUR NAUSEA MEDICATION *UNUSUAL SHORTNESS OF BREATH *UNUSUAL BRUISING OR BLEEDING *URINARY PROBLEMS (pain or burning when urinating, or frequent urination) *BOWEL PROBLEMS (unusual diarrhea, constipation, pain near the anus) TENDERNESS IN MOUTH AND THROAT WITH OR WITHOUT PRESENCE OF ULCERS (sore throat, sores in mouth, or a toothache) UNUSUAL RASH, SWELLING OR PAIN  UNUSUAL VAGINAL DISCHARGE OR ITCHING   Items with * indicate a potential emergency and should be followed up as soon as possible or go to the Emergency Department if any problems should occur.  Please show the CHEMOTHERAPY ALERT CARD or IMMUNOTHERAPY ALERT CARD at  check-in to the Emergency Department and triage nurse.  Should you have questions after your visit or need to cancel or reschedule your appointment, please contact [facility]  [phone] and follow the prompts.  Office hours are 8:00 a.m. to 4:30 p.m. Monday - Friday. Please note that voicemails left after 4:00 p.m. may not be returned until the following business day.  We are closed weekends and major holidays. You have access to a nurse at all times for urgent questions. Please call the main number to the clinic [phone] and follow the prompts.  For any non-urgent questions, you may also contact your provider using MyChart. We now offer e-Visits for anyone 19 and older to request care online for non-urgent symptoms. For details visit [email or web address].   Also download the MyChart app! Go to the app store, search "MyChart", open the app, select [facility], and log in with your MyChart username and password.  Due to Covid, a mask is required upon entering the hospital/clinic. If you do not have a mask, one will be given to you upon arrival. For doctor visits, patients may have 1 support person aged 33 or older with them. For treatment visits, patients cannot have anyone with them due to current Covid guidelines and our immunocompromised population.   Atezolizumab injection What is this medication? ATEZOLIZUMAB (a te zoe LIZ ue mab) is a monoclonal antibody. It is used to treat bladder cancer (urothelial cancer), liver cancer, lung cancer, and melanoma. This medicine may be used for other purposes; ask your health care provider or pharmacist if you have questions. COMMON BRAND NAME(S):  Tecentriq What should I tell my care team before I take this medication? They need to know if you have any of these conditions: autoimmune diseases like Crohn's disease, ulcerative colitis, or lupus have had or planning to have an allogeneic stem cell transplant (uses  someone else's stem cells) history of organ transplant history of radiation to the chest nervous system problems like myasthenia gravis or Guillain-Barre syndrome an unusual or allergic reaction to atezolizumab, other medicines, foods, dyes, or preservatives pregnant or trying to get pregnant breast-feeding How should I use this medication? This medicine is for infusion into a vein. It is given by a health care professional in a hospital or clinic setting. A special MedGuide will be given to you before each treatment. Be sure to read this information carefully each time. Talk to your pediatrician regarding the use of this medicine in children. Special care may be needed. Overdosage: If you think you have taken too much of this medicine contact a poison control center or emergency room at once. NOTE: This medicine is only for you. Do not share this medicine with others. What if I miss a dose? It is important not to miss your dose. Call your doctor or health care professional if you are unable to keep an appointment. What may interact with this medication? Interactions have not been studied. This list may not describe all possible interactions. Give your health care provider a list of all the medicines, herbs, non-prescription drugs, or dietary supplements you use. Also tell them if you smoke, drink alcohol, or use illegal drugs. Some items may interact with your medicine. What should I watch for while using this medication? Your condition will be monitored carefully while you are receiving this medicine. You may need blood work done while you are taking this medicine. Do not become pregnant while taking this medicine or for at least 5 months after stopping it. Women should inform their doctor if they wish to become pregnant or think they might be pregnant. There is a potential for serious side effects to an unborn child. Talk to your health care professional or pharmacist for more information. Do  not breast-feed an infant while taking this medicine or for at least 5 months after the last dose. What side effects may I notice from receiving this medication? Side effects that you should report to your doctor or health care professional as soon as possible: allergic reactions like skin rash, itching or hives, swelling of the face, lips, or tongue black, tarry stools bloody or watery diarrhea breathing problems changes in vision chest pain or chest tightness chills facial flushing fever headache signs and symptoms of high blood sugar such as dizziness; dry mouth; dry skin; fruity breath; nausea; stomach pain; increased hunger or thirst; increased urination signs and symptoms of liver injury like dark yellow or brown urine; general ill feeling or flu-like symptoms; light-colored stools; loss of appetite; nausea; right upper belly pain; unusually weak or tired; yellowing of the eyes or skin stomach pain trouble passing urine or change in the amount of urine Side effects that usually do not require medical attention (report to your doctor or health care professional if they continue or are bothersome): bone pain cough diarrhea joint pain muscle pain muscle weakness swelling of arms or legs tiredness weight loss This list may not describe all possible side effects. Call your doctor for medical advice about side effects. You may report side effects to FDA at 1-800-FDA-1088. Where should I keep my medication? This  drug is given in a hospital or clinic and will not be stored at home. NOTE: This sheet is a summary. It may not cover all possible information. If you have questions about this medicine, talk to your doctor, pharmacist, or health care provider.  2022 Elsevier/Gold Standard (2020-09-05 13:59:34)  Zoledronic Acid Injection (Hypercalcemia, Oncology) What is this medication? ZOLEDRONIC ACID (ZOE le dron ik AS id) slows calcium loss from bones. It high calcium levels in the  blood from some kinds of cancer. It may be used in other people at risk for bone loss. This medicine may be used for other purposes; ask your health care provider or pharmacist if you have questions. COMMON BRAND NAME(S): Zometa What should I tell my care team before I take this medication? They need to know if you have any of these conditions: cancer dehydration dental disease kidney disease liver disease low levels of calcium in the blood lung or breathing disease (asthma) receiving steroids like dexamethasone or prednisone an unusual or allergic reaction to zoledronic acid, other medicines, foods, dyes, or preservatives pregnant or trying to get pregnant breast-feeding How should I use this medication? This drug is injected into a vein. It is given by a health care provider in a hospital or clinic setting. Talk to your health care provider about the use of this drug in children. Special care may be needed. Overdosage: If you think you have taken too much of this medicine contact a poison control center or emergency room at once. NOTE: This medicine is only for you. Do not share this medicine with others. What if I miss a dose? Keep appointments for follow-up doses. It is important not to miss your dose. Call your health care provider if you are unable to keep an appointment. What may interact with this medication? certain antibiotics given by injection NSAIDs, medicines for pain and inflammation, like ibuprofen or naproxen some diuretics like bumetanide, furosemide teriparatide thalidomide This list may not describe all possible interactions. Give your health care provider a list of all the medicines, herbs, non-prescription drugs, or dietary supplements you use. Also tell them if you smoke, drink alcohol, or use illegal drugs. Some items may interact with your medicine. What should I watch for while using this medication? Visit your health care provider for regular checks on your  progress. It may be some time before you see the benefit from this drug. Some people who take this drug have severe bone, joint, or muscle pain. This drug may also increase your risk for jaw problems or a broken thigh bone. Tell your health care provider right away if you have severe pain in your jaw, bones, joints, or muscles. Tell you health care provider if you have any pain that does not go away or that gets worse. Tell your dentist and dental surgeon that you are taking this drug. You should not have major dental surgery while on this drug. See your dentist to have a dental exam and fix any dental problems before starting this drug. Take good care of your teeth while on this drug. Make sure you see your dentist for regular follow-up appointments. You should make sure you get enough calcium and vitamin D while you are taking this drug. Discuss the foods you eat and the vitamins you take with your health care provider. Check with your health care provider if you have severe diarrhea, nausea, and vomiting, or if you sweat a lot. The loss of too much body fluid may make  it dangerous for you to take this drug. You may need blood work done while you are taking this drug. Do not become pregnant while taking this drug. Women should inform their health care provider if they wish to become pregnant or think they might be pregnant. There is potential for serious harm to an unborn child. Talk to your health care provider for more information. What side effects may I notice from receiving this medication? Side effects that you should report to your doctor or health care provider as soon as possible: allergic reactions (skin rash, itching or hives; swelling of the face, lips, or tongue) bone pain infection (fever, chills, cough, sore throat, pain or trouble passing urine) jaw pain, especially after dental work joint pain kidney injury (trouble passing urine or change in the amount of urine) low blood pressure  (dizziness; feeling faint or lightheaded, falls; unusually weak or tired) low calcium levels (fast heartbeat; muscle cramps or pain; pain, tingling, or numbness in the hands or feet; seizures) low magnesium levels (fast, irregular heartbeat; muscle cramp or pain; muscle weakness; tremors; seizures) low red blood cell counts (trouble breathing; feeling faint; lightheaded, falls; unusually weak or tired) muscle pain redness, blistering, peeling, or loosening of the skin, including inside the mouth severe diarrhea swelling of the ankles, feet, hands trouble breathing Side effects that usually do not require medical attention (report to your doctor or health care provider if they continue or are bothersome): anxious constipation coughing depressed mood eye irritation, itching, or pain fever general ill feeling or flu-like symptoms nausea pain, redness, or irritation at site where injected trouble sleeping This list may not describe all possible side effects. Call your doctor for medical advice about side effects. You may report side effects to FDA at 1-800-FDA-1088. Where should I keep my medication? This drug is given in a hospital or clinic. It will not be stored at home. NOTE: This sheet is a summary. It may not cover all possible information. If you have questions about this medicine, talk to your doctor, pharmacist, or health care provider.  2022 Elsevier/Gold Standard (2019-09-21 09:13:00)

## 2021-09-29 NOTE — Progress Notes (Signed)
Pt tolerated all infusions well today with no problems or complaints.  Pt left infusion suite stable and ambulatory.

## 2021-09-29 NOTE — Progress Notes (Signed)
Has fatigue; Eating pretty well. Denies any side effects from treatment. Has chronic back pain. Constipation relieved by stool softener.

## 2021-09-29 NOTE — Progress Notes (Signed)
Hematology/Oncology Follow up note [facility] Telephone:([phone] Fax:([phone]) [phone]   Patient Care Team: Glean Hess, MD as PCP - General (Internal Medicine) Charolette Forward, MD as Consulting Physician (Cardiology) Telford Nab, RN as Registered Nurse [name], MD as Radiation Oncologist (Radiation Oncology)   REASON FOR VISIT:  Follow-up for small cell lung cancer,   HISTORY OF PRESENTING ILLNESS:  Kristi [name] is a  75 y.o.  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  10/20/2018 CT chest with contrast showed large mediastinal mass involving both hilar, left greater than right, consistent with lung carcinoma, The mass causes narrowing of the left mainstem bronchus with resultant volume loss on the left and a mediastinal shift to the left.  Moderate size left pleural effusion. 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  # Initial MRI brain negative.  # Nov 2019- Jan 2020 s/p 4 cycles of Carbo/Etoposide/Tecentriq #  01/24/2019 interim CT scan done which showed continued positive response to therapy with continued reduction in mediastinal adenopathy.  No residual measurable left lung mass. CT findings of acute emphysematous cystitis. Urology did not feel that patient has pyelonephritis and recommend treatment with antibiotics because patient's immunocompromise. Patient finished treatment.   # 11/02/2018-01/09/2019 Chemotherapy carboplatin + Etoposide + tecentriq # Consolidation chest radiation and whole brain radiation finished in May 2020 # 04/25/2019 resume Tecentriq every 3 weeks. # Patient had a fall from her stairs on 01/19/2020. In the emergency room she had CT chest abdomen pelvis CT, CT head without contrast, CT cervical spine without contrast. No CT evidence for acute intracranial abnormality, degenerative  changes of cervical spine..  No CT evidence of acute thoracic abdomen injury.  Severe compression fracture of T7, subacute.  # kyphoplasty procedure on 03/01/2020.  T7 biopsy negative for cancer.  11/20/2020 Surveillance CT scan  Reduced size and prominence of right lateral lung base subpleural nodular density. No findings of active malignancy.  MRI brain showed stable post treatment brain. No metastatic disease.    INTERVAL HISTORY Kristi [name] is a 75 y.o. female who has above history reviewed by me today presents for evaluation prior to immunotherapy for treatment of extensive small cell lung cancer. Patient is on immunotherapy maintenance. She reports feeling well. She has no new complaints.   Review of Systems  Constitutional:  Positive for fatigue. Negative for appetite change, chills and fever.  HENT:   Negative for hearing loss and voice change.   Eyes:  Negative for eye problems.  Respiratory:  Negative for chest tightness, cough and shortness of breath.   Cardiovascular:  Negative for chest pain.  Gastrointestinal:  Negative for abdominal distention, abdominal pain, blood in stool, diarrhea and nausea.  Endocrine: Negative for hot flashes.  Genitourinary:  Negative for difficulty urinating and frequency.   Musculoskeletal:  Positive for back pain. Negative for arthralgias.  Skin:  Negative for itching and rash.  Neurological:  Negative for extremity weakness and headaches.  Hematological:  Negative for adenopathy.  Psychiatric/Behavioral:  Negative for confusion and depression. The patient is not nervous/anxious.        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[place]) 09/30/2014   1 stent   Claustrophobia    Coronary artery disease    GERD (gastroesophageal reflux disease)    Hypercholesteremia  MI, old    Pneumonia    Small cell lung cancer ([place])    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 ([place]) 10/27/2018     SURGICAL HISTORY: Past Surgical History:  Procedure Laterality Date   APPENDECTOMY     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   BLADDER NECK SUSPENSION     CHOLECYSTECTOMY N/A 08/14/2018   Procedure: LAPAROSCOPIC CHOLECYSTECTOMY;  Surgeon: Jules Husbands, MD;  Location: [facility] ORS;  Service: General;  Laterality: N/A;   CHOLECYSTECTOMY  11/2018   CORONARY ANGIOPLASTY  09/29/2014   CORONARY STENT PLACEMENT     ENDOBRONCHIAL ULTRASOUND N/A 10/21/2018   Procedure: ENDOBRONCHIAL ULTRASOUND;  Surgeon: Laverle Hobby, MD;  Location: [facility] ORS;  Service: Pulmonary;  Laterality: N/A;   HERNIA REPAIR  2012   IR FLUORO GUIDE CV LINE RIGHT  12/19/2018   KYPHOPLASTY N/A 03/01/2020   Procedure: T7 KYPHOPLASTY;  Surgeon: Hessie Knows, MD;  Location: [facility] ORS;  Service: Orthopedics;  Laterality: N/A;   LAPAROTOMY     LEFT HEART CATHETERIZATION WITH CORONARY ANGIOGRAM N/A 09/29/2014   Procedure: LEFT HEART CATHETERIZATION WITH CORONARY ANGIOGRAM;  Surgeon: Clent Demark, MD;  Location: [place] CATH LAB;  Service: Cardiovascular;  Laterality: N/A;   PORTA CATH INSERTION N/A 01/02/2019   Procedure: PORTA CATH INSERTION;  Surgeon: Algernon Huxley, MD;  Location: [place] CV LAB;  Service: Cardiovascular;  Laterality: N/A;   PORTACATH PLACEMENT Right 10/28/2018   Procedure: INSERTION PORT-A-CATH;  Surgeon: Jules Husbands, MD;  Location: [facility] ORS;  Service: General;  Laterality: Right;   XI ROBOTIC ASSISTED VENTRAL HERNIA N/A 02/27/2021   Procedure: XI ROBOTIC ASSISTED VENTRAL HERNIA (incisional) WITH MESH;  Surgeon: Olean Ree, MD;  Location: [facility] ORS;  Service: General;  Laterality: N/A;    SOCIAL HISTORY: Social History   Socioeconomic History   Marital status: Married    Spouse name: [name]    Number of children: 3   Years of education: Not on file   Highest education level: Not on file  Occupational History   Occupation: Retired    Comment: Chief Technology Officer   Tobacco Use   Smoking status: Former     Packs/day: 1.00    Years: 39.00    Pack years: 39.00    Types: Cigarettes    Start date: 12/21/1978    Quit date: 10/16/2018    Years since quitting: 2.9   Smokeless tobacco: Never  Vaping Use   Vaping Use: Never used  Substance and Sexual Activity   Alcohol use: Yes    Alcohol/week: 0.0 standard drinks    Comment: occassional - approx 1 every 2 weeks    Drug use: No   Sexual activity: Yes    Birth control/protection: None  Other Topics Concern   Not on file  Social History Narrative   Not on file   Social Determinants of Health   Financial Resource Strain: Not on file  Food Insecurity: Not on file  Transportation Needs: Not on file  Physical Activity: Not on file  Stress: Not on file  Social Connections: Not on file  Intimate Partner Violence: Not on file    FAMILY HISTORY: Family History  Problem Relation Age of Onset   Alzheimer's disease Mother    Colon cancer Father    Breast cancer Neg Hx     ALLERGIES:  has No Known Allergies.  MEDICATIONS:  Current Outpatient Medications  Medication Sig Dispense Refill   acetaminophen (TYLENOL) 500 MG tablet Take 2 tablets (1,000  mg total) by mouth every 6 (six) hours as needed for mild pain or headache.     aspirin EC 81 MG tablet Take 1 tablet (81 mg total) by mouth daily. Swallow whole. 30 tablet 0   atorvastatin (LIPITOR) 10 MG tablet Take 1 tablet (10 mg total) by mouth daily. 90 tablet 3   Calcium 600-200 MG-UNIT tablet Take 1 tablet by mouth 2 (two) times daily.     Cholecalciferol (DIALYVITE VITAMIN D 5000) 125 MCG (5000 UT) capsule Take 5,000 Units by mouth daily.     lidocaine-prilocaine (EMLA) cream Apply 1 application topically daily as needed (port access). 30 g 2   Magnesium 250 MG TABS Take 250 mg by mouth daily.     oxyCODONE (ROXICODONE) 5 MG immediate release tablet Take 1 tablet (5 mg total) by mouth every 6 (six) hours as needed for severe pain. 30 tablet 0   pantoprazole (PROTONIX) 40 MG tablet Take 1  tablet (40 mg total) by mouth 2 (two) times daily. 60 tablet 0   sertraline (ZOLOFT) 100 MG tablet Take 1 tablet (100 mg total) by mouth daily. 90 tablet 3   sucralfate (CARAFATE) 1 g tablet Take 1 g by mouth 3 (three) times daily as needed (heartburn).     traZODone (DESYREL) 50 MG tablet Take 1 tablet (50 mg total) by mouth at bedtime as needed for sleep. 30 tablet 2   vitamin B-12 (CYANOCOBALAMIN) 1000 MCG tablet Take 1,000 mcg by mouth daily.     prochlorperazine (COMPAZINE) 10 MG tablet Take 10 mg by mouth every 6 (six) hours as needed for nausea or vomiting. (Patient not taking: Reported on 09/29/2021)     No current facility-administered medications for this visit.   Facility-Administered Medications Ordered in Other Visits  Medication Dose Route Frequency Provider Last Rate Last Admin   heparin lock flush 100 unit/mL  500 Units Intravenous Once Earlie Server, MD       heparin lock flush 100 unit/mL  500 Units Intravenous Once Earlie Server, MD       sodium chloride flush (NS) 0.9 % injection 10 mL  10 mL Intravenous PRN Earlie Server, MD   10 mL at 01/09/19 0820   sodium chloride flush (NS) 0.9 % injection 10 mL  10 mL Intravenous PRN Earlie Server, MD   10 mL at 01/10/19 1400   sodium chloride flush (NS) 0.9 % injection 10 mL  10 mL Intracatheter PRN Earlie Server, MD         PHYSICAL EXAMINATION: ECOG PERFORMANCE STATUS: 1 - Symptomatic but completely ambulatory Vitals:   09/29/21 0921  BP: 111/82  Pulse: 81  Temp: 97.7 F (36.5 C)  SpO2: 100%   Filed Weights   09/29/21 0921  Weight: 144 lb 9.6 oz (65.6 kg)   Physical Examination Today's Vitals   09/29/21 0921  BP: 111/82  Pulse: 81  Temp: 97.7 F (36.5 C)  TempSrc: Oral  SpO2: 100%  Weight: 144 lb 9.6 oz (65.6 kg)  PainSc: 2    Body mass index is 22.65 kg/m.   Physical Exam Constitutional:      General: She is not in acute distress.    Appearance: She is not diaphoretic.     Comments: Patient walks independently  HENT:      Head: Normocephalic and atraumatic.     Nose: Nose normal.     Mouth/Throat:     Pharynx: No oropharyngeal exudate.  Eyes:     General: No scleral icterus.  Pupils: Pupils are equal, round, and reactive to light.  Cardiovascular:     Rate and Rhythm: Normal rate and regular rhythm.     Heart sounds: No murmur heard. Pulmonary:     Effort: Pulmonary effort is normal. No respiratory distress.     Breath sounds: No wheezing or rales.     Comments: Decreased breath sounds bilaterally.  Abdominal:     General: There is no distension.     Palpations: Abdomen is soft.     Tenderness: There is no abdominal tenderness.  Musculoskeletal:        General: Normal range of motion.     Cervical back: Normal range of motion and neck supple.  Skin:    General: Skin is warm and dry.     Findings: No erythema.  Neurological:     Mental Status: She is alert and oriented to person, place, and time.     Cranial Nerves: No cranial nerve deficit.     Motor: No abnormal muscle tone.     Coordination: Coordination normal.  Psychiatric:        Mood and Affect: Affect normal.       LABORATORY DATA:  I have reviewed the data as listed Lab Results  Component Value Date   WBC 7.3 09/29/2021   HGB 12.5 09/29/2021   HCT 36.9 09/29/2021   MCV 88.9 09/29/2021   PLT 207 09/29/2021   Recent Labs    08/11/21 0829 09/01/21 0915 09/29/21 0859  NA 136 137 134*  K 3.9 3.8 3.9  CL 102 104 101  CO2 26 26 27   GLUCOSE 105* 96 99  BUN 15 16 17   CREATININE 0.79 0.74 0.79  CALCIUM 9.5 9.4 9.2  GFRNONAA >60 >60 >60  PROT 7.2 7.1 7.4  ALBUMIN 4.0 4.1 4.0  AST 20 21 22   ALT 14 15 16   ALKPHOS 50 52 49  BILITOT 0.6 0.6 0.3     RADIOGRAPHIC STUDIES: I have personally reviewed the radiological images as listed and agreed with the findings in the report.  No results found.    ASSESSMENT & PLAN:  1. Encounter for antineoplastic immunotherapy   2. Small cell lung cancer ([place])   3. 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 Brain ischemia    #Extensive small cell lung cancer, S/p 4 cycles of Carboplatin, Etoposide and Tecentriq.   Currently on Tecentriq maintenance. Labs are reviewed and discussed with patient. Proceed with Tecentriq today.  Obtain CT chest abdomen pelvis prior to next visit.    #Chronic intermittent headache, MRI brain in May 2022 showed no intracranial brain metastasis. There were acute ischemia changes for which I have asked patient to follow-up with primary care provider.  Patient is taking aspirin, statin for hyperlipidemia.  #Fatigue, normal thyroid function. # History bone metastasis and Osteopenia  zometa every 4-6 weeks.  Continue calcium and vitamin D.  Zometa  today.   #GERD, on omeprazole, stable symptoms.  Continue Protonix # COPD/emphysema, Stable. Monitor.   Follow up in 3 weeks for next cycle of Tecentriq   Earlie Server, MD, PhD

## 2021-10-17 ENCOUNTER — Ambulatory Visit
Admission: RE | Admit: 2021-10-17 | Discharge: 2021-10-17 | Disposition: A | Discharge status: Home / Self Care | Payer: Medicare Other | Source: Ambulatory Visit | Attending: Oncology | Admitting: Oncology

## 2021-10-17 ENCOUNTER — Other Ambulatory Visit: Payer: Self-pay

## 2021-10-17 DIAGNOSIS — N289 Disorder of kidney and ureter, unspecified: Secondary | ICD-10-CM | POA: Diagnosis not present

## 2021-10-17 DIAGNOSIS — M4854XA Collapsed vertebra, not elsewhere classified, thoracic region, initial encounter for fracture: Secondary | ICD-10-CM | POA: Diagnosis not present

## 2021-10-17 DIAGNOSIS — C3490 Malignant neoplasm of unspecified part of unspecified bronchus or lung: Secondary | ICD-10-CM | POA: Insufficient documentation

## 2021-10-17 DIAGNOSIS — J432 Centrilobular emphysema: Secondary | ICD-10-CM | POA: Diagnosis not present

## 2021-10-17 DIAGNOSIS — Z85118 Personal history of other malignant neoplasm of bronchus and lung: Secondary | ICD-10-CM | POA: Diagnosis not present

## 2021-10-17 DIAGNOSIS — I2510 Atherosclerotic heart disease of native coronary artery without angina pectoris: Secondary | ICD-10-CM | POA: Diagnosis not present

## 2021-10-17 DIAGNOSIS — N8110 Cystocele, unspecified: Secondary | ICD-10-CM | POA: Diagnosis not present

## 2021-10-17 MED ORDER — IOHEXOL 300 MG/ML  SOLN
100.0000 mL | Freq: Once | INTRAMUSCULAR | Status: AC | PRN
  Administered 2021-10-17: 100 mL via INTRAVENOUS

## 2021-10-20 ENCOUNTER — Inpatient Hospital Stay: Payer: Medicare Other

## 2021-10-20 ENCOUNTER — Inpatient Hospital Stay (HOSPITAL_BASED_OUTPATIENT_CLINIC_OR_DEPARTMENT_OTHER): Payer: Medicare Other | Admitting: Oncology

## 2021-10-20 ENCOUNTER — Other Ambulatory Visit: Payer: Self-pay

## 2021-10-20 ENCOUNTER — Encounter: Payer: Self-pay | Admitting: Oncology

## 2021-10-20 VITALS — BP 113/87 | HR 80 | Temp 98.6°F | Resp 18 | Wt 147.3 lb

## 2021-10-20 DIAGNOSIS — M545 Low back pain, unspecified: Secondary | ICD-10-CM | POA: Diagnosis not present

## 2021-10-20 DIAGNOSIS — Z5112 Encounter for antineoplastic immunotherapy: Secondary | ICD-10-CM | POA: Diagnosis not present

## 2021-10-20 DIAGNOSIS — C3490 Malignant neoplasm of unspecified part of unspecified bronchus or lung: Secondary | ICD-10-CM | POA: Diagnosis not present

## 2021-10-20 DIAGNOSIS — G8929 Other chronic pain: Secondary | ICD-10-CM

## 2021-10-20 DIAGNOSIS — Z87891 Personal history of nicotine dependence: Secondary | ICD-10-CM | POA: Diagnosis not present

## 2021-10-20 DIAGNOSIS — M8580 Other specified disorders of bone density and structure, unspecified site: Secondary | ICD-10-CM

## 2021-10-20 DIAGNOSIS — Z23 Encounter for immunization: Secondary | ICD-10-CM | POA: Diagnosis not present

## 2021-10-20 DIAGNOSIS — Z8589 Personal history of malignant neoplasm of other organs and systems: Secondary | ICD-10-CM | POA: Diagnosis not present

## 2021-10-20 LAB — COMPREHENSIVE METABOLIC PANEL
ALT: 16 U/L (ref 0–44)
AST: 19 U/L (ref 15–41)
Albumin: 4.2 g/dL (ref 3.5–5.0)
Alkaline Phosphatase: 49 U/L (ref 38–126)
Anion gap: 7 (ref 5–15)
BUN: 17 mg/dL (ref 8–23)
CO2: 28 mmol/L (ref 22–32)
Calcium: 9.5 mg/dL (ref 8.9–10.3)
Chloride: 99 mmol/L (ref 98–111)
Creatinine, Ser: 0.77 mg/dL (ref 0.44–1.00)
GFR, Estimated: >60 mL/min (ref >60)
Glucose, Bld: 98 mg/dL (ref 70–99)
Potassium: 3.9 mmol/L (ref 3.5–5.1)
Sodium: 134 mmol/L — ABNORMAL LOW (ref 135–145)
Total Bilirubin: 0.6 mg/dL (ref 0.3–1.2)
Total Protein: 7.4 g/dL (ref 6.5–8.1)

## 2021-10-20 LAB — CBC WITH DIFFERENTIAL/PLATELET
Abs Immature Granulocytes: 0.03 10*3/uL (ref 0.00–0.07)
Basophils Absolute: 0 10*3/uL (ref 0.0–0.1)
Basophils Relative: 1 %
Eosinophils Absolute: 0.4 10*3/uL (ref 0.0–0.5)
Eosinophils Relative: 5 %
HCT: 35.1 % — ABNORMAL LOW (ref 36.0–46.0)
Hemoglobin: 11.9 g/dL — ABNORMAL LOW (ref 12.0–15.0)
Immature Granulocytes: 0 %
Lymphocytes Relative: 25 %
Lymphs Abs: 1.8 10*3/uL (ref 0.7–4.0)
MCH: 30.1 pg (ref 26.0–34.0)
MCHC: 33.9 g/dL (ref 30.0–36.0)
MCV: 88.9 fL (ref 80.0–100.0)
Monocytes Absolute: 0.9 10*3/uL (ref 0.1–1.0)
Monocytes Relative: 13 %
Neutro Abs: 4 10*3/uL (ref 1.7–7.7)
Neutrophils Relative %: 56 %
Platelets: 188 10*3/uL (ref 150–400)
RBC: 3.95 MIL/uL (ref 3.87–5.11)
RDW: 13.1 % (ref 11.5–15.5)
WBC: 7.2 10*3/uL (ref 4.0–10.5)
nRBC: 0 % (ref 0.0–0.2)

## 2021-10-20 MED ORDER — HEPARIN SOD (PORK) LOCK FLUSH 100 UNIT/ML IV SOLN
500.0000 [IU] | Freq: Once | INTRAVENOUS | Status: AC
  Administered 2021-10-20: 500 [IU] via INTRAVENOUS
  Filled 2021-10-20: qty 5

## 2021-10-20 MED ORDER — SODIUM CHLORIDE 0.9 % IV SOLN
1200.0000 mg | Freq: Once | INTRAVENOUS | Status: AC
  Administered 2021-10-20: 1200 mg via INTRAVENOUS
  Filled 2021-10-20: qty 20

## 2021-10-20 MED ORDER — SODIUM CHLORIDE 0.9 % IV SOLN
Freq: Once | INTRAVENOUS | Status: AC
  Filled 2021-10-20: qty 250

## 2021-10-20 MED ORDER — SODIUM CHLORIDE 0.9% FLUSH
10.0000 mL | INTRAVENOUS | Status: DC | PRN
  Administered 2021-10-20: 10 mL via INTRAVENOUS
  Filled 2021-10-20: qty 10

## 2021-10-20 MED ORDER — INFLUENZA VAC A&B SA ADJ QUAD 0.5 ML IM PRSY
0.5000 mL | PREFILLED_SYRINGE | Freq: Once | INTRAMUSCULAR | Status: AC
  Administered 2021-10-20: 0.5 mL via INTRAMUSCULAR
  Filled 2021-10-20: qty 0.5

## 2021-10-20 MED ORDER — OXYCODONE HCL 5 MG PO TABS: 5.0000 mg | ORAL_TABLET | Freq: Two times a day (BID) | ORAL | 0 refills | Status: DC | PRN

## 2021-10-20 NOTE — Progress Notes (Signed)
Pt here for follow up. No new concerns voiced.   

## 2021-10-20 NOTE — Patient Instructions (Addendum)
[facility]  Discharge Instructions: Thank you for choosing [facility] to provide your oncology and hematology care.  If you have a lab appointment with the [facility], please go directly to the [facility] and check in at the registration area.  Wear comfortable clothing and clothing appropriate for easy access to any Portacath or PICC line.   We strive to give you quality time with your provider. You may need to reschedule your appointment if you arrive late (15 or more minutes).  Arriving late affects you and other patients whose appointments are after yours.  Also, if you miss three or more appointments without notifying the office, you may be dismissed from the clinic at the provider's discretion.      For prescription refill requests, have your pharmacy contact our office and allow 72 hours for refills to be completed.    Today you received the following chemotherapy and/or immunotherapy agents TECENTRIQ      To help prevent nausea and vomiting after your treatment, we encourage you to take your nausea medication as directed.  BELOW ARE SYMPTOMS THAT SHOULD BE REPORTED IMMEDIATELY: *FEVER GREATER THAN 100.4 F (38 C) OR HIGHER *CHILLS OR SWEATING *NAUSEA AND VOMITING THAT IS NOT CONTROLLED WITH YOUR NAUSEA MEDICATION *UNUSUAL SHORTNESS OF BREATH *UNUSUAL BRUISING OR BLEEDING *URINARY PROBLEMS (pain or burning when urinating, or frequent urination) *BOWEL PROBLEMS (unusual diarrhea, constipation, pain near the anus) TENDERNESS IN MOUTH AND THROAT WITH OR WITHOUT PRESENCE OF ULCERS (sore throat, sores in mouth, or a toothache) UNUSUAL RASH, SWELLING OR PAIN  UNUSUAL VAGINAL DISCHARGE OR ITCHING   Items with * indicate a potential emergency and should be followed up as soon as possible or go to the Emergency Department if any problems should occur.  Please show the CHEMOTHERAPY ALERT CARD or IMMUNOTHERAPY ALERT CARD at check-in  to the Emergency Department and triage nurse.  Should you have questions after your visit or need to cancel or reschedule your appointment, please contact [facility]  [phone] and follow the prompts.  Office hours are 8:00 a.m. to 4:30 p.m. Monday - Friday. Please note that voicemails left after 4:00 p.m. may not be returned until the following business day.  We are closed weekends and major holidays. You have access to a nurse at all times for urgent questions. Please call the main number to the clinic [phone] and follow the prompts.  For any non-urgent questions, you may also contact your provider using MyChart. We now offer e-Visits for anyone 68 and older to request care online for non-urgent symptoms. For details visit [email or web address].   Also download the MyChart app! Go to the app store, search "MyChart", open the app, select [facility], and log in with your MyChart username and password.  Due to Covid, a mask is required upon entering the hospital/clinic. If you do not have a mask, one will be given to you upon arrival. For doctor visits, patients may have 1 support person aged 8 or older with them. For treatment visits, patients cannot have anyone with them due to current Covid guidelines and our immunocompromised population.   Atezolizumab injection What is this medication? ATEZOLIZUMAB (a te zoe LIZ ue mab) is a monoclonal antibody. It is used to treat bladder cancer (urothelial cancer), liver cancer, lung cancer, and melanoma. This medicine may be used for other purposes; ask your health care provider or pharmacist if you have questions. COMMON BRAND NAME(S): [employer]  What should I tell my care team before I take this medication? They need to know if you have any of these conditions: autoimmune diseases like Crohn's disease, ulcerative colitis, or lupus have had or planning to have an allogeneic stem cell transplant (uses someone  else's stem cells) history of organ transplant history of radiation to the chest nervous system problems like myasthenia gravis or Guillain-Barre syndrome an unusual or allergic reaction to atezolizumab, other medicines, foods, dyes, or preservatives pregnant or trying to get pregnant breast-feeding How should I use this medication? This medicine is for infusion into a vein. It is given by a health care professional in a hospital or clinic setting. A special MedGuide will be given to you before each treatment. Be sure to read this information carefully each time. Talk to your pediatrician regarding the use of this medicine in children. Special care may be needed. Overdosage: If you think you have taken too much of this medicine contact a poison control center or emergency room at once. NOTE: This medicine is only for you. Do not share this medicine with others. What if I miss a dose? It is important not to miss your dose. Call your doctor or health care professional if you are unable to keep an appointment. What may interact with this medication? Interactions have not been studied. This list may not describe all possible interactions. Give your health care provider a list of all the medicines, herbs, non-prescription drugs, or dietary supplements you use. Also tell them if you smoke, drink alcohol, or use illegal drugs. Some items may interact with your medicine. What should I watch for while using this medication? Your condition will be monitored carefully while you are receiving this medicine. You may need blood work done while you are taking this medicine. Do not become pregnant while taking this medicine or for at least 5 months after stopping it. Women should inform their doctor if they wish to become pregnant or think they might be pregnant. There is a potential for serious side effects to an unborn child. Talk to your health care professional or pharmacist for more information. Do not  breast-feed an infant while taking this medicine or for at least 5 months after the last dose. What side effects may I notice from receiving this medication? Side effects that you should report to your doctor or health care professional as soon as possible: allergic reactions like skin rash, itching or hives, swelling of the face, lips, or tongue black, tarry stools bloody or watery diarrhea breathing problems changes in vision chest pain or chest tightness chills facial flushing fever headache signs and symptoms of high blood sugar such as dizziness; dry mouth; dry skin; fruity breath; nausea; stomach pain; increased hunger or thirst; increased urination signs and symptoms of liver injury like dark yellow or brown urine; general ill feeling or flu-like symptoms; light-colored stools; loss of appetite; nausea; right upper belly pain; unusually weak or tired; yellowing of the eyes or skin stomach pain trouble passing urine or change in the amount of urine Side effects that usually do not require medical attention (report to your doctor or health care professional if they continue or are bothersome):Influenza (Flu) Vaccine (Inactivated or Recombinant): What You Need to Know 1. Why get vaccinated? Influenza vaccine can prevent influenza (flu). Flu is a contagious disease that spreads around the [place] every year, usually between October and May. Anyone can get the flu, but it is more dangerous for some people. Infants and  young children, people 30 years and older, pregnant people, and people with certain health conditions or a weakened immune system are at greatest risk of flu complications. Pneumonia, bronchitis, sinus infections, and ear infections are examples of flu-related complications. If you have a medical condition, such as heart disease, cancer, or diabetes, flu can make it worse. Flu can cause fever and chills, sore throat, muscle aches, fatigue, cough, headache, and runny or  stuffy nose. Some people may have vomiting and diarrhea, though this is more common in children than adults. In an average year, thousands of people in the [place] die from flu, and many more are hospitalized. Flu vaccine prevents millions of illnesses and flu-related visits to the doctor each year. 2. Influenza vaccines CDC recommends everyone 6 months and older get vaccinated every flu season. Children 6 months through 49 years of age may need 2 doses during a single flu season. Everyone else needs only 1 dose each flu season. It takes about 2 weeks for protection to develop after vaccination. There are many flu viruses, and they are always changing. Each year a new flu vaccine is made to protect against the influenza viruses believed to be likely to cause disease in the upcoming flu season. Even when the vaccine doesn't exactly match these viruses, it may still provide some protection. Influenza vaccine does not cause flu. Influenza vaccine may be given at the same time as other vaccines. 3. Talk with your health care provider Tell your vaccination provider if the person getting the vaccine: Has had an allergic reaction after a previous dose of influenza vaccine, or has any severe, life-threatening allergies Has ever had Guillain-Barr Syndrome (also called "GBS") In some cases, your health care provider may decide to postpone influenza vaccination until a future visit. Influenza vaccine can be administered at any time during pregnancy. People who are or will be pregnant during influenza season should receive inactivated influenza vaccine. People with minor illnesses, such as a cold, may be vaccinated. People who are moderately or severely ill should usually wait until they recover before getting influenza vaccine. Your health care provider can give you more information. 4. Risks of a vaccine reaction Soreness, redness, and swelling where the shot is given, fever, muscle aches, and headache  can happen after influenza vaccination. There may be a very small increased risk of Guillain-Barr Syndrome (GBS) after inactivated influenza vaccine (the flu shot). Young children who get the flu shot along with pneumococcal vaccine (PCV13) and/or DTaP vaccine at the same time might be slightly more likely to have a seizure caused by fever. Tell your health care provider if a child who is getting flu vaccine has ever had a seizure. People sometimes faint after medical procedures, including vaccination. Tell your provider if you feel dizzy or have vision changes or ringing in the ears. As with any medicine, there is a very remote chance of a vaccine causing a severe allergic reaction, other serious injury, or death. 5. What if there is a serious problem? An allergic reaction could occur after the vaccinated person leaves the clinic. If you see signs of a severe allergic reaction (hives, swelling of the face and throat, difficulty breathing, a fast heartbeat, dizziness, or weakness), call 9-1-1 and get the person to the nearest hospital. For other signs that concern you, call your health care provider. Adverse reactions should be reported to the Vaccine Adverse Event Reporting System (VAERS). Your health care provider will usually file this report, or you can do  it yourself. Visit the VAERS website at www.vaers.SamedayNews.es or call 914 827 6723. VAERS is only for reporting reactions, and VAERS staff members do not give medical advice. 6. The National Vaccine Injury Compensation Program The [employer] Vaccine Injury Compensation Program (VICP) is a federal program that was created to compensate people who may have been injured by certain vaccines. Claims regarding alleged injury or death due to vaccination have a time limit for filing, which may be as short as two years. Visit the VICP website at GoldCloset.com.ee or call 301-006-7704 to learn about the program and about filing a claim. 7. How  can I learn more? Ask your health care provider. Call your local or state health department. Visit the website of the Food and Drug Administration (FDA) for vaccine package inserts and additional information at TraderRating.uy. Contact the Centers for Disease Control and Prevention (CDC): Call (218)602-7174 (1-800-CDC-INFO) or Visit CDC's website at https://gibson.com/. Vaccine Information Statement Inactivated Influenza Vaccine (07/26/2020) This information is not intended to replace advice given to you by your health care provider. Make sure you discuss any questions you have with your health care provider. Document Revised: 09/12/2020 Document Reviewed: 09/12/2020 Elsevier Patient Education  2022 [employer].  bone pain cough diarrhea joint pain muscle pain muscle weakness swelling of arms or legs tiredness weight loss This list may not describe all possible side effects. Call your doctor for medical advice about side effects. You may report side effects to FDA at 1-800-FDA-1088. Where should I keep my medication? This drug is given in a hospital or clinic and will not be stored at home. NOTE: This sheet is a summary. It may not cover all possible information. If you have questions about this medicine, talk to your doctor, pharmacist, or health care provider.  2022 Elsevier/Gold Standard (2020-09-05 13:59:34)

## 2021-10-20 NOTE — Progress Notes (Signed)
Hematology/Oncology Follow up note [facility] Telephone:(336) [phone] [phone]   Patient Care Team: Glean Hess, MD as PCP - General (Internal Medicine) Charolette Forward, MD as Consulting Physician (Cardiology) Telford Nab, RN as Registered Nurse [name], MD as Radiation Oncologist (Radiation Oncology)   REASON FOR VISIT:  Follow-up for small cell lung cancer,   HISTORY OF PRESENTING ILLNESS:  Kristi [name] is a  75 y.o.  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  10/20/2018 CT chest with contrast showed large mediastinal mass involving both hilar, left greater than right, consistent with lung carcinoma, The mass causes narrowing of the left mainstem bronchus with resultant volume loss on the left and a mediastinal shift to the left.  Moderate size left pleural effusion. 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  # Initial MRI brain negative.  # Nov 2019- Jan 2020 s/p 4 cycles of Carbo/Etoposide/Tecentriq #  01/24/2019 interim CT scan done which showed continued positive response to therapy with continued reduction in mediastinal adenopathy.  No residual measurable left lung mass. CT findings of acute emphysematous cystitis. Urology did not feel that patient has pyelonephritis and recommend treatment with antibiotics because patient's immunocompromise. Patient finished treatment.   # 11/02/2018-01/09/2019 Chemotherapy carboplatin + Etoposide + tecentriq # Consolidation chest radiation and whole brain radiation finished in May 2020 # 04/25/2019 resume Tecentriq every 3 weeks. # Patient had a fall from her stairs on 01/19/2020. In the emergency room she had CT chest abdomen pelvis CT, CT head without contrast, CT cervical spine without contrast. No CT evidence for acute intracranial abnormality, degenerative  changes of cervical spine..  No CT evidence of acute thoracic abdomen injury.  Severe compression fracture of T7, subacute.  # kyphoplasty procedure on 03/01/2020.  T7 biopsy negative for cancer.  11/20/2020 Surveillance CT scan  Reduced size and prominence of right lateral lung base subpleural nodular density. No findings of active malignancy.  MRI brain showed stable post treatment brain. No metastatic disease.    INTERVAL HISTORY Kristi [name] is a 75 y.o. female who has above history reviewed by me today presents for evaluation prior to immunotherapy for treatment of extensive small cell lung cancer. Patient reports feeling well. There is no new complaints. Chronic back pain, she takes oxycodone intermittently.  She needs a refill.  Review of Systems  Constitutional:  Positive for fatigue. Negative for appetite change, chills and fever.  HENT:   Negative for hearing loss and voice change.   Eyes:  Negative for eye problems.  Respiratory:  Negative for chest tightness, cough and shortness of breath.   Cardiovascular:  Negative for chest pain.  Gastrointestinal:  Negative for abdominal distention, abdominal pain, blood in stool, diarrhea and nausea.  Endocrine: Negative for hot flashes.  Genitourinary:  Negative for difficulty urinating and frequency.   Musculoskeletal:  Positive for back pain. Negative for arthralgias.  Skin:  Negative for itching and rash.  Neurological:  Negative for extremity weakness and headaches.  Hematological:  Negative for adenopathy.  Psychiatric/Behavioral:  Negative for confusion and depression. The patient is not nervous/anxious.        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[place]) 09/30/2014   1 stent   Claustrophobia    Coronary artery disease    GERD (gastroesophageal reflux disease)  Hypercholesteremia    MI, old    Pneumonia    Small cell lung cancer ([place])    Small cell  lung cancer in adult ([place]) 10/27/2018    SURGICAL HISTORY: Past Surgical History:  Procedure Laterality Date   APPENDECTOMY     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   BLADDER NECK SUSPENSION     CHOLECYSTECTOMY N/A 08/14/2018   Procedure: LAPAROSCOPIC CHOLECYSTECTOMY;  Surgeon: Jules Husbands, MD;  Location: [facility] ORS;  Service: General;  Laterality: N/A;   CHOLECYSTECTOMY  11/2018   CORONARY ANGIOPLASTY  09/29/2014   CORONARY STENT PLACEMENT     ENDOBRONCHIAL ULTRASOUND N/A 10/21/2018   Procedure: ENDOBRONCHIAL ULTRASOUND;  Surgeon: Laverle Hobby, MD;  Location: [facility] ORS;  Service: Pulmonary;  Laterality: N/A;   HERNIA REPAIR  2012   IR FLUORO GUIDE CV LINE RIGHT  12/19/2018   KYPHOPLASTY N/A 03/01/2020   Procedure: T7 KYPHOPLASTY;  Surgeon: Hessie Knows, MD;  Location: [facility] ORS;  Service: Orthopedics;  Laterality: N/A;   LAPAROTOMY     LEFT HEART CATHETERIZATION WITH CORONARY ANGIOGRAM N/A 09/29/2014   Procedure: LEFT HEART CATHETERIZATION WITH CORONARY ANGIOGRAM;  Surgeon: Clent Demark, MD;  Location: [place] CATH LAB;  Service: Cardiovascular;  Laterality: N/A;   PORTA CATH INSERTION N/A 01/02/2019   Procedure: PORTA CATH INSERTION;  Surgeon: Algernon Huxley, MD;  Location: [place] CV LAB;  Service: Cardiovascular;  Laterality: N/A;   PORTACATH PLACEMENT Right 10/28/2018   Procedure: INSERTION PORT-A-CATH;  Surgeon: Jules Husbands, MD;  Location: [facility] ORS;  Service: General;  Laterality: Right;   XI ROBOTIC ASSISTED VENTRAL HERNIA N/A 02/27/2021   Procedure: XI ROBOTIC ASSISTED VENTRAL HERNIA (incisional) WITH MESH;  Surgeon: Olean Ree, MD;  Location: [facility] ORS;  Service: General;  Laterality: N/A;    SOCIAL HISTORY: Social History   Socioeconomic History   Marital status: Married    Spouse name: [name]    Number of children: 3   Years of education: Not on file   Highest education level: Not on file  Occupational History   Occupation: Retired    Comment: Chief Technology Officer    Tobacco Use   Smoking status: Former    Packs/day: 1.00    Years: 39.00    Pack years: 39.00    Types: Cigarettes    Start date: 12/21/1978    Quit date: 10/16/2018    Years since quitting: 3.0   Smokeless tobacco: Never  Vaping Use   Vaping Use: Never used  Substance and Sexual Activity   Alcohol use: Yes    Alcohol/week: 0.0 standard drinks    Comment: occassional - approx 1 every 2 weeks    Drug use: No   Sexual activity: Yes    Birth control/protection: None  Other Topics Concern   Not on file  Social History Narrative   Not on file   Social Determinants of Health   Financial Resource Strain: Not on file  Food Insecurity: Not on file  Transportation Needs: Not on file  Physical Activity: Not on file  Stress: Not on file  Social Connections: Not on file  Intimate Partner Violence: Not on file    FAMILY HISTORY: Family History  Problem Relation Age of Onset   Alzheimer's disease Mother    Colon cancer Father    Breast cancer Neg Hx     ALLERGIES:  has No Known Allergies.  MEDICATIONS:  Current Outpatient Medications  Medication Sig Dispense Refill   acetaminophen (TYLENOL) 500 MG tablet  Take 2 tablets (1,000 mg total) by mouth every 6 (six) hours as needed for mild pain or headache.     aspirin EC 81 MG tablet Take 1 tablet (81 mg total) by mouth daily. Swallow whole. 30 tablet 0   atorvastatin (LIPITOR) 10 MG tablet Take 1 tablet (10 mg total) by mouth daily. 90 tablet 3   Calcium 600-200 MG-UNIT tablet Take 1 tablet by mouth 2 (two) times daily.     Cholecalciferol (DIALYVITE VITAMIN D 5000) 125 MCG (5000 UT) capsule Take 5,000 Units by mouth daily.     lidocaine-prilocaine (EMLA) cream Apply 1 application topically daily as needed (port access). 30 g 2   Magnesium 250 MG TABS Take 250 mg by mouth daily.     pantoprazole (PROTONIX) 40 MG tablet Take 1 tablet (40 mg total) by mouth 2 (two) times daily. 60 tablet 0   prochlorperazine (COMPAZINE) 10 MG tablet  Take 10 mg by mouth every 6 (six) hours as needed for nausea or vomiting.     sertraline (ZOLOFT) 100 MG tablet Take 1 tablet (100 mg total) by mouth daily. 90 tablet 3   sucralfate (CARAFATE) 1 g tablet Take 1 g by mouth 3 (three) times daily as needed (heartburn).     traZODone (DESYREL) 50 MG tablet Take 1 tablet (50 mg total) by mouth at bedtime as needed for sleep. 30 tablet 2   vitamin B-12 (CYANOCOBALAMIN) 1000 MCG tablet Take 1,000 mcg by mouth daily.     oxyCODONE (ROXICODONE) 5 MG immediate release tablet Take 1 tablet (5 mg total) by mouth every 12 (twelve) hours as needed for severe pain. 30 tablet 0   No current facility-administered medications for this visit.   Facility-Administered Medications Ordered in Other Visits  Medication Dose Route Frequency Provider Last Rate Last Admin   heparin lock flush 100 unit/mL  500 Units Intravenous Once Earlie Server, MD       heparin lock flush 100 unit/mL  500 Units Intravenous Once Earlie Server, MD       sodium chloride flush (NS) 0.9 % injection 10 mL  10 mL Intravenous PRN Earlie Server, MD   10 mL at 01/09/19 0820   sodium chloride flush (NS) 0.9 % injection 10 mL  10 mL Intravenous PRN Earlie Server, MD   10 mL at 01/10/19 1400     PHYSICAL EXAMINATION: ECOG PERFORMANCE STATUS: 1 - Symptomatic but completely ambulatory Vitals:   10/20/21 0920  BP: 113/87  Pulse: 80  Resp: 18  Temp: 98.6 F (37 C)  SpO2: 99%   Filed Weights   10/20/21 0920  Weight: 147 lb 4.8 oz (66.8 kg)   Physical Examination Today's Vitals   10/20/21 0920  BP: 113/87  Pulse: 80  Resp: 18  Temp: 98.6 F (37 C)  SpO2: 99%  Weight: 147 lb 4.8 oz (66.8 kg)  PainSc: 0-No pain   Body mass index is 23.07 kg/m.   Physical Exam Constitutional:      General: She is not in acute distress.    Appearance: She is not diaphoretic.     Comments: Patient walks independently  HENT:     Head: Normocephalic and atraumatic.     Nose: Nose normal.     Mouth/Throat:      Pharynx: No oropharyngeal exudate.  Eyes:     General: No scleral icterus.    Pupils: Pupils are equal, round, and reactive to light.  Cardiovascular:     Rate and Rhythm: Normal  rate and regular rhythm.     Heart sounds: No murmur heard. Pulmonary:     Effort: Pulmonary effort is normal. No respiratory distress.     Breath sounds: No wheezing or rales.     Comments: Decreased breath sounds bilaterally.  Abdominal:     General: There is no distension.     Palpations: Abdomen is soft.     Tenderness: There is no abdominal tenderness.  Musculoskeletal:        General: Normal range of motion.     Cervical back: Normal range of motion and neck supple.  Skin:    General: Skin is warm and dry.     Findings: No erythema.  Neurological:     Mental Status: She is alert and oriented to person, place, and time.     Cranial Nerves: No cranial nerve deficit.     Motor: No abnormal muscle tone.     Coordination: Coordination normal.  Psychiatric:        Mood and Affect: Affect normal.       LABORATORY DATA:  I have reviewed the data as listed Lab Results  Component Value Date   WBC 7.2 10/20/2021   HGB 11.9 (L) 10/20/2021   HCT 35.1 (L) 10/20/2021   MCV 88.9 10/20/2021   PLT 188 10/20/2021   Recent Labs    09/01/21 0915 09/29/21 0859 10/20/21 0845  NA 137 134* 134*  K 3.8 3.9 3.9  CL 104 101 99  CO2 26 27 28   GLUCOSE 96 99 98  BUN 16 17 17   CREATININE 0.74 0.79 0.77  CALCIUM 9.4 9.2 9.5  GFRNONAA >60 >60 >60  PROT 7.1 7.4 7.4  ALBUMIN 4.1 4.0 4.2  AST 21 22 19   ALT 15 16 16   ALKPHOS 52 49 49  BILITOT 0.6 0.3 0.6     RADIOGRAPHIC STUDIES: I have personally reviewed the radiological images as listed and agreed with the findings in the report.  CT CHEST ABDOMEN PELVIS W CONTRAST  Result Date: 10/20/2021 CLINICAL DATA:  75 year old female with history of lung cancer. Chemotherapy ongoing. Follow-up study. EXAM: CT CHEST, ABDOMEN, AND PELVIS WITH CONTRAST  TECHNIQUE: Multidetector CT imaging of the chest, abdomen and pelvis was performed following the standard protocol during bolus administration of intravenous contrast. CONTRAST:  132mL OMNIPAQUE IOHEXOL 300 MG/ML  SOLN COMPARISON:  CT the chest, abdomen and pelvis 06/16/2021. FINDINGS: CT CHEST FINDINGS Cardiovascular: Heart size is normal. There is no significant pericardial fluid, thickening or pericardial calcification. There is aortic atherosclerosis, as well as atherosclerosis of the great vessels of the mediastinum and the coronary arteries, including calcified atherosclerotic plaque in the left main, left anterior descending, left circumflex and right coronary arteries. Thickening and calcification of the aortic valve. Right internal jugular single-lumen porta cath with tip terminating at the superior cavoatrial junction. Mediastinum/Nodes: No pathologically enlarged mediastinal or hilar lymph nodes. Esophagus is unremarkable in appearance. No axillary lymphadenopathy. Lungs/Pleura: Small calcified granulomas are noted in the right lung. No other suspicious appearing pulmonary nodules or masses are noted. No acute consolidative airspace disease. No pleural effusions. Mild diffuse bronchial wall thickening with mild centrilobular and paraseptal emphysema. Mild linear scarring in the lung bases bilaterally, similar to the prior study. Musculoskeletal: Chronic compression fractures of T4 and T7 are again noted, most severe at T7 where there is 90% loss of anterior vertebral body height and post vertebroplasty changes. Old healed fracture of the superior aspect of the sternum with mild posttraumatic deformity, similar  to the prior study. CT ABDOMEN PELVIS FINDINGS Hepatobiliary: No suspicious cystic or solid hepatic lesions. No intra or extrahepatic biliary ductal dilatation. Status post cholecystectomy. Pancreas: No pancreatic mass. No pancreatic ductal dilatation. No pancreatic or peripancreatic fluid  collections or inflammatory changes. Spleen: Unremarkable. Adrenals/Urinary Tract: Subcentimeter low-attenuation lesion in the right kidney, too small to characterize, but statistically likely to represent a small cyst. Left kidney and bilateral adrenal glands are normal in appearance. No hydroureteronephrosis. Base of urinary bladder extends well below the level of the pubococcygeal line at rest, indicative of a severe cystocele. Urinary bladder is otherwise unremarkable in appearance. Stomach/Bowel: Normal appearance of the stomach. No pathologic dilatation of small bowel or colon. The appendix is not confidently identified and may be surgically absent. Regardless, there are no inflammatory changes noted adjacent to the cecum to suggest the presence of an acute appendicitis at this time. Vascular/Lymphatic: Aortic atherosclerosis, without evidence of aneurysm or dissection in the abdominal or pelvic vasculature. No lymphadenopathy noted in the abdomen or pelvis. Reproductive: Uterus and ovaries are atrophic. Other: No significant volume of ascites.  No pneumoperitoneum. Musculoskeletal: There are no aggressive appearing lytic or blastic lesions noted in the visualized portions of the skeleton. IMPRESSION: 1. No definite findings to suggest local recurrence of disease or definite metastatic disease in the chest, abdomen or pelvis. 2. Mild diffuse bronchial wall thickening with mild centrilobular and paraseptal emphysema; imaging findings suggestive of underlying COPD. 3. Aortic atherosclerosis, in addition to left main and 3 vessel coronary artery disease. Assessment for potential risk factor modification, dietary therapy or pharmacologic therapy may be warranted, if clinically indicated. 4. There are calcifications of the aortic valve. Echocardiographic correlation for evaluation of potential valvular dysfunction may be warranted if clinically indicated. 5. Cystocele. 6. Additional incidental findings, as above.  Electronically Signed   By: Vinnie Langton M.D.   On: 10/20/2021 11:30      ASSESSMENT & PLAN:  1. Small cell lung cancer ([place])   2. Encounter for antineoplastic immunotherapy   3. 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 Osteopenia, unspecified location   5. Chronic bilateral low back pain without sciatica    #Extensive small cell lung cancer, S/p 4 cycles of Carboplatin, Etoposide and Tecentriq.   Currently on Tecentriq maintenance. Labs are reviewed and discussed with patient Proceed with Tecentriq today for CT chest abdomen pelvis was reviewed.  Results for available after her visit No definitive findings to suggest local recurrence.  Mild diffuse bronchial wall thickening with emphysema.  Aortic atherosclerosis.  Calcification of aortic valve. Cystocele.  #Chronic back pain, no aggressive appearing lytic or blastic lesions noted in the visualized portion of skeleton.  Oxycodone as needed.  Refill sent to pharmacy. #Chronic intermittent headache, MRI brain in May 2022 showed no intracranial brain metastasis. There were acute ischemia changes for which I have asked patient to follow-up with primary care provider.  Patient is taking aspirin, statin for hyperlipidemia. Will arrange patient to repeat an MRI with and without contrast.  #Fatigue, normal thyroid function. # History bone metastasis and Osteopenia  zometa every 4-6 weeks.  Schedule Zometa at the next visit.   #GERD, on omeprazole, stable symptoms.  Continue Protonix # COPD/emphysema, Stable. Monitor.   Follow up in 3 weeks for next cycle of Tecentriq   Earlie Server, MD, PhD

## 2021-10-28 ENCOUNTER — Other Ambulatory Visit: Payer: Self-pay

## 2021-10-28 ENCOUNTER — Ambulatory Visit
Admission: RE | Admit: 2021-10-28 | Discharge: 2021-10-28 | Disposition: A | Discharge status: Home / Self Care | Payer: Medicare Other | Source: Ambulatory Visit | Attending: Oncology | Admitting: Oncology

## 2021-10-28 DIAGNOSIS — C3490 Malignant neoplasm of unspecified part of unspecified bronchus or lung: Secondary | ICD-10-CM | POA: Diagnosis not present

## 2021-10-28 DIAGNOSIS — G9389 Other specified disorders of brain: Secondary | ICD-10-CM | POA: Diagnosis not present

## 2021-10-28 MED ORDER — GADOBUTROL 1 MMOL/ML IV SOLN
7.0000 mL | Freq: Once | INTRAVENOUS | Status: AC | PRN
  Administered 2021-10-28: 7 mL via INTRAVENOUS

## 2021-10-31 ENCOUNTER — Telehealth: Payer: Self-pay

## 2021-10-31 NOTE — Telephone Encounter (Signed)
Called pt x2 on her phone and called pt's husband but no answer. Detailed message left on pt's phone letting her know of plan. Call back number provided in case she has questions. Mychart message sent as well.

## 2021-10-31 NOTE — Telephone Encounter (Signed)
-----   Message from Earlie Server, MD sent at 10/29/2021 11:11 PM EST ----- Let her know that MRI brain showed 2 new spots in her brain. Please arrange her to see Dr.Chrystal

## 2021-11-06 ENCOUNTER — Telehealth: Payer: Self-pay

## 2021-11-06 DIAGNOSIS — C3490 Malignant neoplasm of unspecified part of unspecified bronchus or lung: Secondary | ICD-10-CM

## 2021-11-06 DIAGNOSIS — C7931 Secondary malignant neoplasm of brain: Secondary | ICD-10-CM

## 2021-11-06 DIAGNOSIS — Z8589 Personal history of malignant neoplasm of other organs and systems: Secondary | ICD-10-CM

## 2021-11-06 NOTE — Telephone Encounter (Signed)
Per MD, patient will need PET Asap.  Please cancel appts on 11/21 and keep appts on 12/12.   unable to reach pt by phone, so left VM and sent Mychart message to let pt know about plan. Kristi [name] will give updated AVS to pt when she comes with consult with Dr. Baruch Gouty tomorrow.

## 2021-11-06 NOTE — Telephone Encounter (Signed)
Abbie, so sorry, trying to get as much done as possible but I need to leave for the day and have been on hold with scheduling for 10 minutes. Can you get this PET scheduled? Thank you!

## 2021-11-07 ENCOUNTER — Other Ambulatory Visit: Payer: Self-pay

## 2021-11-07 ENCOUNTER — Ambulatory Visit
Admission: RE | Admit: 2021-11-07 | Discharge: 2021-11-07 | Disposition: A | Discharge status: Home / Self Care | Payer: Medicare Other | Source: Ambulatory Visit | Attending: Radiation Oncology | Admitting: Radiation Oncology

## 2021-11-07 ENCOUNTER — Encounter: Payer: Self-pay | Admitting: Oncology

## 2021-11-07 ENCOUNTER — Other Ambulatory Visit: Payer: Self-pay | Admitting: *Deleted

## 2021-11-07 VITALS — BP 103/73 | HR 88 | Temp 98.6°F | Wt 149.1 lb

## 2021-11-07 DIAGNOSIS — C7931 Secondary malignant neoplasm of brain: Secondary | ICD-10-CM

## 2021-11-07 DIAGNOSIS — C781 Secondary malignant neoplasm of mediastinum: Secondary | ICD-10-CM | POA: Insufficient documentation

## 2021-11-07 DIAGNOSIS — C3412 Malignant neoplasm of upper lobe, left bronchus or lung: Secondary | ICD-10-CM | POA: Diagnosis not present

## 2021-11-07 DIAGNOSIS — F17210 Nicotine dependence, cigarettes, uncomplicated: Secondary | ICD-10-CM | POA: Diagnosis not present

## 2021-11-07 DIAGNOSIS — C773 Secondary and unspecified malignant neoplasm of axilla and upper limb lymph nodes: Secondary | ICD-10-CM | POA: Insufficient documentation

## 2021-11-07 DIAGNOSIS — Z08 Encounter for follow-up examination after completed treatment for malignant neoplasm: Secondary | ICD-10-CM | POA: Diagnosis not present

## 2021-11-07 NOTE — Telephone Encounter (Signed)
Pt PET scheduled, 12/1 was only thing next available. Attempted to reach pt.

## 2021-11-07 NOTE — Progress Notes (Signed)
Radiation Oncology Follow up Note old patient new area of brain metastasis  Name: Kristi [name] [facility]   Date:   11/07/2021 MRN:  [id number] DOB: [birth year]    This 75 y.o. 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  REFERRING PROVIDER: Glean Hess, MD  HPI: Patient is a 75 year old female well-known to our department.  Having been treated 3 years prior for extensive stage small cell lung cancer with both concurrent chemoradiation therapy to her chest as well as PCI.  She has been doing extremely well has been on Tecentriq maintenance.  Her CT scans of chest abdomen pelvis have shown no evidence to suggest local recurrence.  She does have some short-term memory loss.  Recent MRI of her brain showed 3 new lesions in the cerebellum consistent with foci of metastatic disease.  She specifically denies any focal neurologic deficits or any change in visual fields.  I have been asked to evaluate her for possible radiation therapy to her cerebellum.  COMPLICATIONS OF TREATMENT: none  FOLLOW UP COMPLIANCE: keeps appointments   PHYSICAL EXAM:  BP 103/73   Pulse 88   Temp 98.6 F (37 C) (Tympanic)   Wt 149 lb 1.6 oz (67.6 kg)   BMI 23.35 kg/m  Crude visual fields are within normal range motor or sensory and DTR levels are equal and symmetric in upper lower extremities.  She is wheelchair-bound appears somewhat frail.  Well-developed well-nourished patient in NAD. HEENT reveals PERLA, EOMI, discs not visualized.  Oral cavity is clear. No oral mucosal lesions are identified. Neck is clear without evidence of cervical or supraclavicular adenopathy. Lungs are clear to A&P. Cardiac examination is essentially unremarkable with regular rate and rhythm without murmur rub or thrill. Abdomen is benign with no organomegaly  or masses noted. Motor sensory and DTR levels are equal and symmetric in the upper and lower extremities. Cranial nerves II through XII are grossly intact. Proprioception is intact. No peripheral adenopathy or edema is identified. No motor or sensory levels are noted. Crude visual fields are within normal range.  RADIOLOGY RESULTS: CT scans of chest abdomen pelvis and MRI of brain reviewed compatible with above-stated findings  PLAN: 3 areas of metastatic lesions in her cerebellum and patient now out 3 years from concurrent chemoradiation therapy for extensive stage small cell lung cancer.  Would offer single fraction SRS treatment to all 3 lesions at once.  I have ordered thin cut MRI scan for treatment planning purposes and then we will set her up for simulation.  Risks and benefits of treatment occluding possible hair loss fatigue skin reaction and swelling of the brain which I will have her on tapered steroid dose after her treatments.  All reviewed with the patient and her family.  They will comprehend my treatment plan well.  I would like to take this opportunity to thank you for allowing me to participate in the care of your patient.[name], MD

## 2021-11-10 ENCOUNTER — Inpatient Hospital Stay: Payer: Medicare Other

## 2021-11-10 ENCOUNTER — Inpatient Hospital Stay: Payer: Medicare Other | Admitting: Oncology

## 2021-11-20 ENCOUNTER — Ambulatory Visit
Admission: RE | Admit: 2021-11-20 | Discharge: 2021-11-20 | Disposition: A | Discharge status: Home / Self Care | Payer: Medicare Other | Source: Ambulatory Visit | Attending: Oncology | Admitting: Oncology

## 2021-11-20 DIAGNOSIS — M4854XS Collapsed vertebra, not elsewhere classified, thoracic region, sequela of fracture: Secondary | ICD-10-CM | POA: Diagnosis not present

## 2021-11-20 DIAGNOSIS — Z8589 Personal history of malignant neoplasm of other organs and systems: Secondary | ICD-10-CM

## 2021-11-20 DIAGNOSIS — I2510 Atherosclerotic heart disease of native coronary artery without angina pectoris: Secondary | ICD-10-CM | POA: Diagnosis not present

## 2021-11-20 DIAGNOSIS — J439 Emphysema, unspecified: Secondary | ICD-10-CM | POA: Diagnosis not present

## 2021-11-20 DIAGNOSIS — M4854XA Collapsed vertebra, not elsewhere classified, thoracic region, initial encounter for fracture: Secondary | ICD-10-CM | POA: Diagnosis not present

## 2021-11-20 DIAGNOSIS — C7931 Secondary malignant neoplasm of brain: Secondary | ICD-10-CM

## 2021-11-20 DIAGNOSIS — C3490 Malignant neoplasm of unspecified part of unspecified bronchus or lung: Secondary | ICD-10-CM

## 2021-11-20 DIAGNOSIS — C7951 Secondary malignant neoplasm of bone: Secondary | ICD-10-CM | POA: Diagnosis not present

## 2021-11-20 DIAGNOSIS — I700 Atherosclerosis of aorta: Secondary | ICD-10-CM | POA: Diagnosis not present

## 2021-11-20 DIAGNOSIS — J438 Other emphysema: Secondary | ICD-10-CM | POA: Diagnosis not present

## 2021-11-20 LAB — GLUCOSE, CAPILLARY: Glucose-Capillary: 73 mg/dL (ref 70–99)

## 2021-11-20 MED ORDER — FLUDEOXYGLUCOSE F - 18 (FDG) INJECTION
7.7000 | Freq: Once | INTRAVENOUS | Status: AC
  Administered 2021-11-20: 7.7 via INTRAVENOUS

## 2021-11-21 ENCOUNTER — Other Ambulatory Visit: Payer: Self-pay

## 2021-11-21 ENCOUNTER — Ambulatory Visit
Admission: RE | Admit: 2021-11-21 | Discharge: 2021-11-21 | Disposition: A | Discharge status: Home / Self Care | Payer: Medicare Other | Source: Ambulatory Visit | Attending: Radiation Oncology | Admitting: Radiation Oncology

## 2021-11-21 DIAGNOSIS — C7931 Secondary malignant neoplasm of brain: Secondary | ICD-10-CM | POA: Insufficient documentation

## 2021-11-21 DIAGNOSIS — G9389 Other specified disorders of brain: Secondary | ICD-10-CM | POA: Diagnosis not present

## 2021-11-21 MED ORDER — GADOBUTROL 1 MMOL/ML IV SOLN
6.0000 mL | Freq: Once | INTRAVENOUS | Status: AC | PRN
  Administered 2021-11-21: 7.5 mL via INTRAVENOUS

## 2021-11-24 ENCOUNTER — Ambulatory Visit: Admission: RE | Admit: 2021-11-24 | Payer: Medicare Other | Source: Ambulatory Visit

## 2021-11-24 ENCOUNTER — Other Ambulatory Visit: Payer: Self-pay | Admitting: Oncology

## 2021-11-24 ENCOUNTER — Other Ambulatory Visit: Payer: Self-pay | Admitting: *Deleted

## 2021-11-24 MED ORDER — DEXAMETHASONE 2 MG PO TABS: 4.0000 mg | ORAL_TABLET | Freq: Two times a day (BID) | ORAL | 0 refills | Status: DC

## 2021-11-27 ENCOUNTER — Inpatient Hospital Stay: Payer: Medicare Other | Attending: Oncology

## 2021-11-27 ENCOUNTER — Ambulatory Visit: Payer: Medicare Other | Admitting: Radiation Oncology

## 2021-11-27 DIAGNOSIS — Z87891 Personal history of nicotine dependence: Secondary | ICD-10-CM | POA: Insufficient documentation

## 2021-11-27 DIAGNOSIS — C3490 Malignant neoplasm of unspecified part of unspecified bronchus or lung: Secondary | ICD-10-CM | POA: Insufficient documentation

## 2021-11-27 DIAGNOSIS — Z5112 Encounter for antineoplastic immunotherapy: Secondary | ICD-10-CM | POA: Insufficient documentation

## 2021-11-27 DIAGNOSIS — Z79899 Other long term (current) drug therapy: Secondary | ICD-10-CM | POA: Insufficient documentation

## 2021-11-27 NOTE — Progress Notes (Signed)
Tumor Board Documentation  Nicholle Falzon [facility] was presented by Dr Tasia Catchings at our Tumor Board on 11/27/2021, which included representatives from medical oncology, radiation oncology, surgical, radiology, pathology, navigation, internal medicine, palliative care, research, nutrition, pharmacy, pulmonology.  Kristi [name] currently presents as a current patient, for discussion with history of the following treatments: neoadjuvant chemoradiation.  Additionally, we reviewed previous medical and familial history, history of present illness, and recent lab results along with all available histopathologic and imaging studies. The tumor board considered available treatment options and made the following recommendations: Active surveillance Recent MRI does not show any lesions    The following procedures/referrals were also placed: No orders of the defined types were placed in this encounter.   Clinical Trial Status: not discussed   Staging used: Clinical Stage AJCC Staging:       Group: ExtensiveStage Small Cell Lung Cancer   National site-specific guidelines NCCN were discussed with respect to the case.  Tumor board is a meeting of clinicians from various specialty areas who evaluate and discuss patients for whom a multidisciplinary approach is being considered. Final determinations in the plan of care are those of the provider(s). The responsibility for follow up of recommendations given during tumor board is that of the provider.   Today's extended care, comprehensive team conference, Brayley was not present for the discussion and was not examined.   Multidisciplinary Tumor Board is a multidisciplinary case peer review process.  Decisions discussed in the Multidisciplinary Tumor Board reflect the opinions of the specialists present at the conference without having examined the patient.  Ultimately, treatment and diagnostic decisions rest with the primary provider(s) and the patient.

## 2021-12-01 ENCOUNTER — Inpatient Hospital Stay (HOSPITAL_BASED_OUTPATIENT_CLINIC_OR_DEPARTMENT_OTHER): Payer: Medicare Other | Admitting: Oncology

## 2021-12-01 ENCOUNTER — Inpatient Hospital Stay: Payer: Medicare Other

## 2021-12-01 ENCOUNTER — Other Ambulatory Visit: Payer: Self-pay

## 2021-12-01 ENCOUNTER — Encounter: Payer: Self-pay | Admitting: Oncology

## 2021-12-01 VITALS — BP 110/81 | HR 75 | Temp 97.5°F | Resp 18 | Wt 146.2 lb

## 2021-12-01 DIAGNOSIS — Z8589 Personal history of malignant neoplasm of other organs and systems: Secondary | ICD-10-CM | POA: Diagnosis not present

## 2021-12-01 DIAGNOSIS — C3490 Malignant neoplasm of unspecified part of unspecified bronchus or lung: Secondary | ICD-10-CM

## 2021-12-01 DIAGNOSIS — E860 Dehydration: Secondary | ICD-10-CM

## 2021-12-01 DIAGNOSIS — Z5112 Encounter for antineoplastic immunotherapy: Secondary | ICD-10-CM | POA: Diagnosis not present

## 2021-12-01 DIAGNOSIS — R5383 Other fatigue: Secondary | ICD-10-CM

## 2021-12-01 DIAGNOSIS — M8580 Other specified disorders of bone density and structure, unspecified site: Secondary | ICD-10-CM | POA: Diagnosis not present

## 2021-12-01 DIAGNOSIS — Z79899 Other long term (current) drug therapy: Secondary | ICD-10-CM | POA: Diagnosis not present

## 2021-12-01 DIAGNOSIS — Z95828 Presence of other vascular implants and grafts: Secondary | ICD-10-CM

## 2021-12-01 DIAGNOSIS — Z87891 Personal history of nicotine dependence: Secondary | ICD-10-CM | POA: Diagnosis not present

## 2021-12-01 LAB — COMPREHENSIVE METABOLIC PANEL
ALT: 18 U/L (ref 0–44)
AST: 24 U/L (ref 15–41)
Albumin: 3.9 g/dL (ref 3.5–5.0)
Alkaline Phosphatase: 45 U/L (ref 38–126)
Anion gap: 9 (ref 5–15)
BUN: 20 mg/dL (ref 8–23)
CO2: 24 mmol/L (ref 22–32)
Calcium: 9.3 mg/dL (ref 8.9–10.3)
Chloride: 102 mmol/L (ref 98–111)
Creatinine, Ser: 0.93 mg/dL (ref 0.44–1.00)
GFR, Estimated: >60 mL/min (ref >60)
Glucose, Bld: 108 mg/dL — ABNORMAL HIGH (ref 70–99)
Potassium: 3.6 mmol/L (ref 3.5–5.1)
Sodium: 135 mmol/L (ref 135–145)
Total Bilirubin: 0.4 mg/dL (ref 0.3–1.2)
Total Protein: 7.1 g/dL (ref 6.5–8.1)

## 2021-12-01 LAB — CBC WITH DIFFERENTIAL/PLATELET
Abs Immature Granulocytes: 0.04 10*3/uL (ref 0.00–0.07)
Basophils Absolute: 0 10*3/uL (ref 0.0–0.1)
Basophils Relative: 1 %
Eosinophils Absolute: 0.3 10*3/uL (ref 0.0–0.5)
Eosinophils Relative: 5 %
HCT: 35.3 % — ABNORMAL LOW (ref 36.0–46.0)
Hemoglobin: 11.8 g/dL — ABNORMAL LOW (ref 12.0–15.0)
Immature Granulocytes: 1 %
Lymphocytes Relative: 24 %
Lymphs Abs: 1.6 10*3/uL (ref 0.7–4.0)
MCH: 29.9 pg (ref 26.0–34.0)
MCHC: 33.4 g/dL (ref 30.0–36.0)
MCV: 89.6 fL (ref 80.0–100.0)
Monocytes Absolute: 0.8 10*3/uL (ref 0.1–1.0)
Monocytes Relative: 13 %
Neutro Abs: 3.6 10*3/uL (ref 1.7–7.7)
Neutrophils Relative %: 56 %
Platelets: 185 10*3/uL (ref 150–400)
RBC: 3.94 MIL/uL (ref 3.87–5.11)
RDW: 13.6 % (ref 11.5–15.5)
WBC: 6.4 10*3/uL (ref 4.0–10.5)
nRBC: 0 % (ref 0.0–0.2)

## 2021-12-01 LAB — TSH: TSH: 1.147 u[IU]/mL (ref 0.350–4.500)

## 2021-12-01 LAB — T4, FREE: Free T4: 0.9 ng/dL (ref 0.61–1.12)

## 2021-12-01 MED ORDER — ONDANSETRON HCL 8 MG PO TABS: 8.0000 mg | ORAL_TABLET | Freq: Three times a day (TID) | ORAL | 1 refills | Status: DC | PRN

## 2021-12-01 MED ORDER — SODIUM CHLORIDE 0.9 % IV SOLN
Freq: Once | INTRAVENOUS | Status: AC
  Filled 2021-12-01: qty 250

## 2021-12-01 MED ORDER — SODIUM CHLORIDE 0.9 % IV SOLN
1200.0000 mg | Freq: Once | INTRAVENOUS | Status: AC
  Administered 2021-12-01: 1200 mg via INTRAVENOUS
  Filled 2021-12-01: qty 20

## 2021-12-01 MED ORDER — ACETAMINOPHEN 325 MG PO TABS
650.0000 mg | ORAL_TABLET | Freq: Once | ORAL | Status: AC
  Administered 2021-12-01: 650 mg via ORAL
  Filled 2021-12-01: qty 2

## 2021-12-01 MED ORDER — ZOLEDRONIC ACID 4 MG/100ML IV SOLN
4.0000 mg | Freq: Once | INTRAVENOUS | Status: AC
  Administered 2021-12-01: 4 mg via INTRAVENOUS
  Filled 2021-12-01: qty 100

## 2021-12-01 MED ORDER — SODIUM CHLORIDE 0.9% FLUSH
10.0000 mL | Freq: Once | INTRAVENOUS | Status: AC
  Administered 2021-12-01: 10 mL via INTRAVENOUS
  Filled 2021-12-01: qty 10

## 2021-12-01 MED ORDER — HEPARIN SOD (PORK) LOCK FLUSH 100 UNIT/ML IV SOLN
500.0000 [IU] | Freq: Once | INTRAVENOUS | Status: AC | PRN
  Administered 2021-12-01: 500 [IU]
  Filled 2021-12-01: qty 5

## 2021-12-01 NOTE — Progress Notes (Signed)
Hematology/Oncology Follow up note [phone] [phone]   Patient Care Team: Glean Hess, MD as PCP - General (Internal Medicine) Charolette Forward, MD as Consulting Physician (Cardiology) Telford Nab, RN as Registered Nurse [name], MD as Radiation Oncologist (Radiation Oncology)   REASON FOR VISIT:  Follow-up for small cell lung cancer,   HISTORY OF PRESENTING ILLNESS:  Kristi [name] is a  75 y.o.  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  10/20/2018 CT chest with contrast showed large mediastinal mass involving both hilar, left greater than right, consistent with lung carcinoma, The mass causes narrowing of the left mainstem bronchus with resultant volume loss on the left and a mediastinal shift to the left.  Moderate size left pleural effusion. 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  # Initial MRI brain negative.  # Nov 2019- Jan 2020 s/p 4 cycles of Carbo/Etoposide/Tecentriq #  01/24/2019 interim CT scan done which showed continued positive response to therapy with continued reduction in mediastinal adenopathy.  No residual measurable left lung mass. CT findings of acute emphysematous cystitis. Urology did not feel that patient has pyelonephritis and recommend treatment with antibiotics because patient's immunocompromise. Patient finished treatment.   # 11/02/2018-01/09/2019 Chemotherapy carboplatin + Etoposide + tecentriq # Consolidation chest radiation and whole brain radiation finished in May 2020 # 04/25/2019 resume Tecentriq every 3 weeks. # Patient had a fall from her stairs on 01/19/2020. In the emergency room she had CT chest abdomen pelvis CT, CT head without contrast, CT cervical spine without contrast. No CT evidence for acute intracranial abnormality, degenerative changes of cervical spine..  No CT  evidence of acute thoracic abdomen injury.  Severe compression fracture of T7, subacute.  # kyphoplasty procedure on 03/01/2020.  T7 biopsy negative for cancer.  11/20/2020 Surveillance CT scan  Reduced size and prominence of right lateral lung base subpleural nodular density. No findings of active malignancy.  MRI brain showed stable post treatment brain. No metastatic disease.    INTERVAL HISTORY Kristi [name] is a 76 y.o. female who has above history reviewed by me today presents for evaluation prior to immunotherapy for treatment of extensive small cell lung cancer. Patient reports feeling well. No new complaints.  10/29/2021 MRI brain w and wo contrast showed two definite new enhancing lesions in cerebellum, with associated restricted diffusion and increased T2 signal, concerning for new foci of metastatic disease. 2. A third lesion in the right cerebellar hemisphere was likely present on the prior exam but continues to show limited contrast enhancement on SPGR; this lesion is associated with diffusion restriction and increased T2 signal, but, on coronal and sagittal T1 sequences, it correlates with an area affected by pulsation artifact. This is also felt to represent a new focus of metastatic disease.  11/20/2021 PET scan - no FDG avid disease in neck chest abdomen or pelvis.   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 No enhancing metastatic disease seen today, chronic small vessel ischemia.   Today patient presents for follow-up.  She reports feeling well.  No new complaints.  She was started on Lipitor.  She has been on aspirin 81 mg.  Review of Systems  Constitutional:  Positive for fatigue. Negative for appetite change, chills and fever.  HENT:   Negative for hearing loss  and voice change.   Eyes:  Negative for eye problems.  Respiratory:  Negative for chest  tightness, cough and shortness of breath.   Cardiovascular:  Negative for chest pain.  Gastrointestinal:  Negative for abdominal distention, abdominal pain, blood in stool, diarrhea and nausea.  Endocrine: Negative for hot flashes.  Genitourinary:  Negative for difficulty urinating and frequency.   Musculoskeletal:  Positive for back pain. Negative for arthralgias.  Skin:  Negative for itching and rash.  Neurological:  Negative for extremity weakness and headaches.  Hematological:  Negative for adenopathy.  Psychiatric/Behavioral:  Negative for confusion and depression. The patient is not nervous/anxious.        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[place]) 09/30/2014   1 stent   Claustrophobia    Coronary artery disease    GERD (gastroesophageal reflux disease)    Hypercholesteremia    MI, old    Pneumonia    Small cell lung cancer ([place])    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 ([place]) 10/27/2018    SURGICAL HISTORY: Past Surgical History:  Procedure Laterality Date   APPENDECTOMY     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   BLADDER NECK SUSPENSION     CHOLECYSTECTOMY N/A 08/14/2018   Procedure: LAPAROSCOPIC CHOLECYSTECTOMY;  Surgeon: Jules Husbands, MD;  Location: [facility] ORS;  Service: General;  Laterality: N/A;   CHOLECYSTECTOMY  11/2018   CORONARY ANGIOPLASTY  09/29/2014   CORONARY STENT PLACEMENT     ENDOBRONCHIAL ULTRASOUND N/A 10/21/2018   Procedure: ENDOBRONCHIAL ULTRASOUND;  Surgeon: Laverle Hobby, MD;  Location: [facility] ORS;  Service: Pulmonary;  Laterality: N/A;   HERNIA REPAIR  2012   IR FLUORO GUIDE CV LINE RIGHT  12/19/2018   KYPHOPLASTY N/A 03/01/2020   Procedure: T7 KYPHOPLASTY;  Surgeon: Hessie Knows, MD;  Location: [facility] ORS;  Service: Orthopedics;  Laterality: N/A;   LAPAROTOMY     LEFT HEART CATHETERIZATION WITH CORONARY ANGIOGRAM N/A 09/29/2014   Procedure: LEFT HEART CATHETERIZATION WITH CORONARY ANGIOGRAM;   Surgeon: Clent Demark, MD;  Location: [place] [facility];  Service: Cardiovascular;  Laterality: N/A;   PORTA CATH INSERTION N/A 01/02/2019   Procedure: PORTA CATH INSERTION;  Surgeon: Algernon Huxley, MD;  Location: [place] [facility];  Service: Cardiovascular;  Laterality: N/A;   PORTACATH PLACEMENT Right 10/28/2018   Procedure: INSERTION PORT-A-CATH;  Surgeon: Jules Husbands, MD;  Location: [facility] OR[facility];  Service: General;  Laterality: Right;   XI ROBOTIC ASSISTED VENTRAL HERNIA N/A 02/27/2021   Procedure: XI ROBOTIC ASSISTED VENTRAL HERNIA (incisional) WITH MESH;  Surgeon: Olean Ree, MD;  Location: [facility] OR[facility];  Service: General;  Laterality: N/A;    SOCIAL HISTORY: Social History   Socioeconomic History   Marital status: Married    Spouse name: [name]    Number of children: 3   Years of education: Not on file   Highest education level: Not on file  Occupational History   Occupation: Retired    Comment: Chief Technology Officer   Tobacco Use   Smoking status: Former    Packs/day: 1.00    Years: 39.00    Pack years: 39.00    Types: Cigarettes    Start date: 12/21/1978    Quit date: 10/16/2018    Years since quitting: 3.1   Smokeless tobacco: Never  Vaping Use   Vaping Use: Never used  Substance and Sexual Activity   Alcohol use: Yes  Alcohol/week: 0.0 standard drinks    Comment: occassional - approx 1 every 2 weeks    Drug use: No   Sexual activity: Yes    Birth control/protection: None  Other Topics Concern   Not on file  Social History Narrative   Not on file   Social Determinants of Health   Financial Resource Strain: Not on file  Food Insecurity: Not on file  Transportation Needs: Not on file  Physical Activity: Not on file  Stress: Not on file  Social Connections: Not on file  Intimate Partner Violence: Not on file    FAMILY HISTORY: Family History  Problem Relation Age of Onset   Alzheimer's disease Mother    Colon cancer Father    Breast cancer Neg Hx     ALLERGIES:   has No Known Allergies.  MEDICATIONS:  Current Outpatient Medications  Medication Sig Dispense Refill   acetaminophen (TYLENOL) 500 MG tablet Take 2 tablets (1,000 mg total) by mouth every 6 (six) hours as needed for mild pain or headache.     aspirin EC 81 MG tablet Take 1 tablet (81 mg total) by mouth daily. Swallow whole. 30 tablet 0   atorvastatin (LIPITOR) 10 MG tablet Take 1 tablet (10 mg total) by mouth daily. 90 tablet 3   Calcium 600-200 MG-UNIT tablet Take 1 tablet by mouth 2 (two) times daily.     Cholecalciferol (DIALYVITE VITAMIN D 5000) 125 MCG (5000 UT) capsule Take 5,000 Units by mouth daily.     dexamethasone (DECADRON) 2 MG tablet Take 2 tablets (4 mg total) by mouth 2 (two) times daily. 4 mg BID x 3 days, 4 mg daily x3 days, 2 mg daily x days. 21 tablet 0   lidocaine-prilocaine (EMLA) cream Apply 1 application topically daily as needed (port access). 30 g 2   Magnesium 250 MG TABS Take 250 mg by mouth daily.     oxyCODONE (ROXICODONE) 5 MG immediate release tablet Take 1 tablet (5 mg total) by mouth every 12 (twelve) hours as needed for severe pain. 30 tablet 0   pantoprazole (PROTONIX) 40 MG tablet Take 1 tablet (40 mg total) by mouth 2 (two) times daily. 60 tablet 0   prochlorperazine (COMPAZINE) 10 MG tablet Take 10 mg by mouth every 6 (six) hours as needed for nausea or vomiting.     sertraline (ZOLOFT) 100 MG tablet Take 1 tablet (100 mg total) by mouth daily. 90 tablet 3   sucralfate (CARAFATE) 1 g tablet Take 1 g by mouth 3 (three) times daily as needed (heartburn).     traZODone (DESYREL) 50 MG tablet Take 1 tablet (50 mg total) by mouth at bedtime as needed for sleep. 30 tablet 2   vitamin B-12 (CYANOCOBALAMIN) 1000 MCG tablet Take 1,000 mcg by mouth daily.     No current facility-administered medications for this visit.   Facility-Administered Medications Ordered in Other Visits  Medication Dose Route Frequency Provider Last Rate Last Admin   heparin lock flush  100 unit/mL  500 Units Intravenous Once Earlie Server, MD       heparin lock flush 100 unit/mL  500 Units Intravenous Once Earlie Server, MD       sodium chloride flush (NS) 0.9 % injection 10 mL  10 mL Intravenous PRN Earlie Server, MD   10 mL at 01/09/19 0820   sodium chloride flush (NS) 0.9 % injection 10 mL  10 mL Intravenous PRN Earlie Server, MD   10 mL at 01/10/19 1400  PHYSICAL EXAMINATION: ECOG PERFORMANCE STATUS: 1 - Symptomatic but completely ambulatory Vitals:   12/01/21 0832  BP: 110/81  Pulse: 75  Resp: 18  Temp: (!) 97.5 F (36.4 C)  SpO2: 98%   Filed Weights   12/01/21 0832  Weight: 146 lb 3.2 oz (66.3 kg)   Physical Examination Today's Vitals   12/01/21 0832  BP: 110/81  Pulse: 75  Resp: 18  Temp: (!) 97.5 F (36.4 C)  SpO2: 98%  Weight: 146 lb 3.2 oz (66.3 kg)  PainSc: 0-No pain   Body mass index is 22.9 kg/m.   Physical Exam Constitutional:      General: She is not in acute distress.    Appearance: She is not diaphoretic.     Comments: Patient walks independently  HENT:     Head: Normocephalic and atraumatic.     Nose: Nose normal.     Mouth/Throat:     Pharynx: No oropharyngeal exudate.  Eyes:     General: No scleral icterus.    Pupils: Pupils are equal, round, and reactive to light.  Cardiovascular:     Rate and Rhythm: Normal rate and regular rhythm.     Heart sounds: No murmur heard. Pulmonary:     Effort: Pulmonary effort is normal. No respiratory distress.     Breath sounds: No wheezing or rales.     Comments: Decreased breath sounds bilaterally.  Abdominal:     General: There is no distension.     Palpations: Abdomen is soft.     Tenderness: There is no abdominal tenderness.  Musculoskeletal:        General: Normal range of motion.     Cervical back: Normal range of motion and neck supple.  Skin:    General: Skin is warm and dry.     Findings: No erythema.  Neurological:     Mental Status: She is alert and oriented to person, place, and  time.     Cranial Nerves: No cranial nerve deficit.     Motor: No abnormal muscle tone.     Coordination: Coordination normal.  Psychiatric:        Mood and Affect: Affect normal.       LABORATORY DATA:  I have reviewed the data as listed Lab Results  Component Value Date   WBC 6.4 12/01/2021   HGB 11.8 (L) 12/01/2021   HCT 35.3 (L) 12/01/2021   MCV 89.6 12/01/2021   PLT 185 12/01/2021   Recent Labs    09/29/21 0859 10/20/21 0845 12/01/21 0809  NA 134* 134* 135  K 3.9 3.9 3.6  CL 101 99 102  CO2 27 28 24   GLUCOSE 99 98 108*  BUN 17 17 20   CREATININE 0.79 0.77 0.93  CALCIUM 9.2 9.5 9.3  GFRNONAA >60 >60 >60  PROT 7.4 7.4 7.1  ALBUMIN 4.0 4.2 3.9  AST 22 19 24   ALT 16 16 18   ALKPHOS 49 49 45  BILITOT 0.3 0.6 0.4     RADIOGRAPHIC STUDIES: I have personally reviewed the radiological images as listed and agreed with the findings in the report.  MR Brain W Wo Contrast  Result Date: 11/22/2021 CLINICAL DATA:  Follow-up metastatic disease. Whole-brain radiotherapy with new cerebellar lesions under consideration for focus treatment. EXAM: MRI HEAD WITHOUT AND WITH CONTRAST TECHNIQUE: Multiplanar, multiecho pulse sequences of the brain and surrounding structures were obtained without and with intravenous contrast. CONTRAST:  7.41mL GADAVIST GADOBUTROL 1 MMOL/ML IV SOLN COMPARISON:  10/28/2021 FINDINGS: Brain: The  areas of cerebellar enhancement have resolved and there is now cleft like T2 hyperintensity in these areas compatible with prior infarcts. There is a background of extensive chronic small vessel ischemia specially in the supratentorial white matter in the setting of prior whole-brain radiotherapy. Pontine and deep gray nuclei ischemic changes well. No hydrocephalus, collection, or swelling. Vascular: Normal flow voids and vascular enhancement. Skull and upper cervical spine: Normal marrow signal Sinuses/Orbits: Negative IMPRESSION: 1. Resolved sites of cerebellar  enhancement best attributed to maturing infarcts. No enhancing metastatic disease seen today. 2. Chronic small vessel ischemia. Electronically Signed   By: Jorje Guild M.D.   On: 11/22/2021 08:53   MR Brain W Wo Contrast  Result Date: 10/29/2021 CLINICAL DATA:  Small cell lung cancer, status post radiation EXAM: MRI HEAD WITHOUT AND WITH CONTRAST TECHNIQUE: Multiplanar, multiecho pulse sequences of the brain and surrounding structures were obtained without and with intravenous contrast. CONTRAST:  67mL GADAVIST GADOBUTROL 1 MMOL/ML IV SOLN COMPARISON:  05/20/2021 normal. FINDINGS: Brain: Multiple foci of restricted diffusion in the cerebellum (series 3, images 56, 57, and 62). A focus in the left cerebellum correlates with an enhancing lesion that measures up to 9 mm (series 1016, image 42, which is new from the prior exam and is associated with increased T2 signal. Additional lesion in the right cerebellum measures up to 6 mm (series 1016, image 139), also new from the prior exam and associated with increased T2 signal. An additional lesion more superiorly in the right cerebellum correlates with a smaller focus of restricted diffusion seen on the prior exam; evaluation for contrast enhancement in this lesion is somewhat limited given its location in areas affected by pulsation artifact on the coronal and sagittal T1 sequences (series 14, image 13 and series 15, image 13), with only minimal enhancement on the axial SPGR (series 1016, image 122). This lesion are associated with T2 hyperintense signal (series 10, image 16) and likely measures up to 9 mm (series 15, image 14). No other restricted diffusion. No dural thickening. No mass effect or midline shift. No hydrocephalus or extra-axial collection. Redemonstrated T2 hyperintense signal in the periventricular white matter, likely related to prior radiation. Vascular: Normal flow voids. Skull and upper cervical spine: Normal marrow signal. Sinuses/Orbits:  Negative. Other: Trace fluid in left mastoid air cells. IMPRESSION: 1. Two definite new enhancing lesions in cerebellum, with associated restricted diffusion and increased T2 signal, concerning for new foci of metastatic disease. 2. A third lesion in the right cerebellar hemisphere was likely present on the prior exam but continues to show limited contrast enhancement on SPGR; this lesion is associated with diffusion restriction and increased T2 signal, but, on coronal and sagittal T1 sequences, it correlates with an area affected by pulsation artifact. This is also felt to represent a new focus of metastatic disease. Electronically Signed   By: Merilyn Baba M.D.   On: 10/29/2021 14:32   CT CHEST ABDOMEN PELVIS W CONTRAST  Result Date: 10/20/2021 CLINICAL DATA:  75 year old female with history of lung cancer. Chemotherapy ongoing. Follow-up study. EXAM: CT CHEST, ABDOMEN, AND PELVIS WITH CONTRAST TECHNIQUE: Multidetector CT imaging of the chest, abdomen and pelvis was performed following the standard protocol during bolus administration of intravenous contrast. CONTRAST:  165mL OMNIPAQUE IOHEXOL 300 MG/ML  SOLN COMPARISON:  CT the chest, abdomen and pelvis 06/16/2021. FINDINGS: CT CHEST FINDINGS Cardiovascular: Heart size is normal. There is no significant pericardial fluid, thickening or pericardial calcification. There is aortic atherosclerosis, as well as atherosclerosis  of the great vessels of the mediastinum and the coronary arteries, including calcified atherosclerotic plaque in the left main, left anterior descending, left circumflex and right coronary arteries. Thickening and calcification of the aortic valve. Right internal jugular single-lumen porta cath with tip terminating at the superior cavoatrial junction. Mediastinum/Nodes: No pathologically enlarged mediastinal or hilar lymph nodes. Esophagus is unremarkable in appearance. No axillary lymphadenopathy. Lungs/Pleura: Small calcified granulomas  are noted in the right lung. No other suspicious appearing pulmonary nodules or masses are noted. No acute consolidative airspace disease. No pleural effusions. Mild diffuse bronchial wall thickening with mild centrilobular and paraseptal emphysema. Mild linear scarring in the lung bases bilaterally, similar to the prior study. Musculoskeletal: Chronic compression fractures of T4 and T7 are again noted, most severe at T7 where there is 90% loss of anterior vertebral body height and post vertebroplasty changes. Old healed fracture of the superior aspect of the sternum with mild posttraumatic deformity, similar to the prior study. CT ABDOMEN PELVIS FINDINGS Hepatobiliary: No suspicious cystic or solid hepatic lesions. No intra or extrahepatic biliary ductal dilatation. Status post cholecystectomy. Pancreas: No pancreatic mass. No pancreatic ductal dilatation. No pancreatic or peripancreatic fluid collections or inflammatory changes. Spleen: Unremarkable. Adrenals/Urinary Tract: Subcentimeter low-attenuation lesion in the right kidney, too small to characterize, but statistically likely to represent a small cyst. Left kidney and bilateral adrenal glands are normal in appearance. No hydroureteronephrosis. Base of urinary bladder extends well below the level of the pubococcygeal line at rest, indicative of a severe cystocele. Urinary bladder is otherwise unremarkable in appearance. Stomach/Bowel: Normal appearance of the stomach. No pathologic dilatation of small bowel or colon. The appendix is not confidently identified and may be surgically absent. Regardless, there are no inflammatory changes noted adjacent to the cecum to suggest the presence of an acute appendicitis at this time. Vascular/Lymphatic: Aortic atherosclerosis, without evidence of aneurysm or dissection in the abdominal or pelvic vasculature. No lymphadenopathy noted in the abdomen or pelvis. Reproductive: Uterus and ovaries are atrophic. Other: No  significant volume of ascites.  No pneumoperitoneum. Musculoskeletal: There are no aggressive appearing lytic or blastic lesions noted in the visualized portions of the skeleton. IMPRESSION: 1. No definite findings to suggest local recurrence of disease or definite metastatic disease in the chest, abdomen or pelvis. 2. Mild diffuse bronchial wall thickening with mild centrilobular and paraseptal emphysema; imaging findings suggestive of underlying COPD. 3. Aortic atherosclerosis, in addition to left main and 3 vessel coronary artery disease. Assessment for potential risk factor modification, dietary therapy or pharmacologic therapy may be warranted, if clinically indicated. 4. There are calcifications of the aortic valve. Echocardiographic correlation for evaluation of potential valvular dysfunction may be warranted if clinically indicated. 5. Cystocele. 6. Additional incidental findings, as above. Electronically Signed   By: Vinnie Langton M.D.   On: 10/20/2021 11:30   NM PET Image Restag (PS) Skull Base To Thigh  Result Date: 11/21/2021 CLINICAL DATA:  Subsequent treatment strategy for lung cancer with ongoing therapy found to have metastatic disease to the brain. EXAM: NUCLEAR MEDICINE PET SKULL BASE TO THIGH TECHNIQUE: 7.7 mCi F-18 FDG was injected intravenously. Full-ring PET imaging was performed from the skull base to thigh after the radiotracer. CT data was obtained and used for attenuation correction and anatomic localization. Fasting blood glucose: 73 mg/dl COMPARISON:  Multiple prior studies most recent comparison imaging of the chest abdomen and pelvis study from October 17, 2021. FINDINGS: Mediastinal blood pool activity: SUV max 2.85 Liver activity: SUV max  NA NECK: No hypermetabolic lymph nodes in the neck. Incidental CT findings: CNS metastases not well seen, refer to prior MRI for additional detail. CHEST: No hypermetabolic mediastinal or hilar nodes. No suspicious pulmonary nodules on the CT  scan. Incidental CT findings: RIGHT-sided Port-A-Cath terminates at the caval to atrial junction. Heart size is normal. Aortic atherosclerosis without aneurysm. Normal caliber of the central pulmonary vasculature. Limited assessment of cardiovascular structures given lack of intravenous contrast. Lungs are clear. Mild pulmonary emphysema. Scarring at the LEFT lung base related to prior post treatment changes. Airways are patent. ABDOMEN/PELVIS: No abnormal hypermetabolic activity within the liver, pancreas, adrenal glands, or spleen. No hypermetabolic lymph nodes in the abdomen or pelvis. Incidental CT findings: Post cholecystectomy. No acute findings relative to liver, spleen, pancreas, adrenal glands, kidneys, urinary bladder, stomach, small or large bowel. Findings of small hiatal hernia. Evidence of previous ventral midline abdominal wall reconstruction/hernia repair. Signs of colonic diverticulosis. Aortic atheromatous plaque with heavy calcification of the abdominal aorta without aneurysmal dilation. Signs of cystocele with descent of the bladder base below the symphysis pubis. The reproductive structures grossly unremarkable. SKELETON: No focal hypermetabolic activity to suggest skeletal metastasis. Incidental CT findings: Degenerative changes throughout the spine. Signs of osteopenia. Cement augmentation related to compression fracture in the midthoracic spine as seen on previous imaging IMPRESSION: No FDG avid disease in the neck, chest, abdomen or pelvis. Signs of pulmonary emphysema and evidence of prior therapy involving the LEFT lower lobe. Aortic atherosclerosis and coronary artery calcification. Thoracic spine compression fractures 1 post cement augmentation as on recent CT imaging. 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 Electronically Signed   By: Zetta Bills M.D.   On: 11/21/2021 14:59      ASSESSMENT & PLAN:  1. Small cell lung cancer ([place])   2. Encounter for  antineoplastic immunotherapy   3. 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 Osteopenia, unspecified location    #Extensive small cell lung cancer, S/p 4 cycles of Carboplatin, Etoposide and Tecentriq.   Currently on Tecentriq maintenance. Image findings were reviewed and discussed with patient.  Patient was discussed on tumor board on 11/27/2021.  Brain lesions likely infarction.  Continue aspirin and statin.  Follow-up with primary care provider. Proceed with Tecentriq today.  #Chronic back pain, no aggressive appearing lytic or blastic lesions noted in the visualized portion of skeleton.  Oxycodone as needed.  #Chronic intermittent headache, stable symptoms.   #Fatigue, normal thyroid function. # History bone metastasis and Osteopenia  zometa every 4-6 weeks.  Proceed with Zometa today.  Continue calcium supplementation.[name] Kitchen   #GERD/burping, continue PPI. # COPD/emphysema, Stable. Monitor.   Follow up in 3 weeks for next cycle of Tecentriq   Earlie Server, MD, PhD

## 2021-12-01 NOTE — Progress Notes (Signed)
Pt here for follow up. No new concerns voiced.   

## 2021-12-02 IMAGING — MR MR HEAD WO/W CM
10 of 14 series · 29 of 48 positions shown · IV contrast (gadavist)
Comparison: 05/20/2021 normal.

CLINICAL DATA: Small cell lung cancer, status post radiation

EXAM:
MRI HEAD WITHOUT AND WITH CONTRAST
TECHNIQUE: Multiplanar, multiecho pulse sequences of the brain and surrounding
structures were obtained without and with intravenous contrast.
CONTRAST:  7mL GADAVIST GADOBUTROL 1 MMOL/ML IV SOLN

[Series 2: T1 · sagittal · 3.0mm · 0.38mm/px · 2 of 37 slices shown]
[im 1/37]
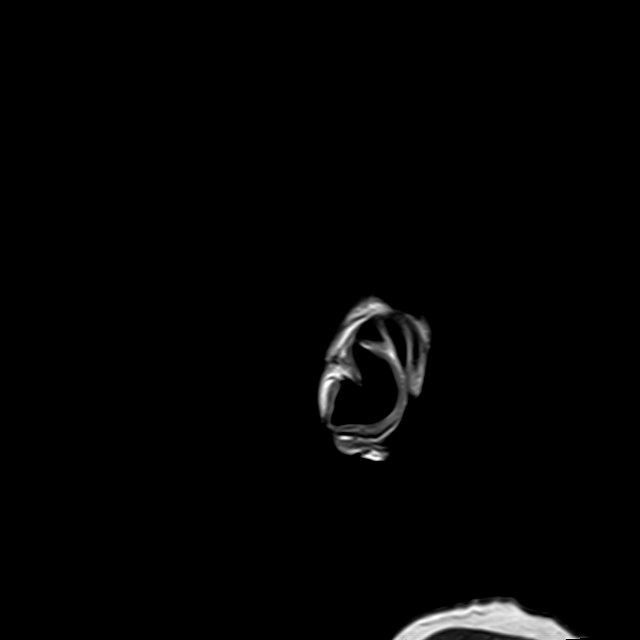
[im 37/37]
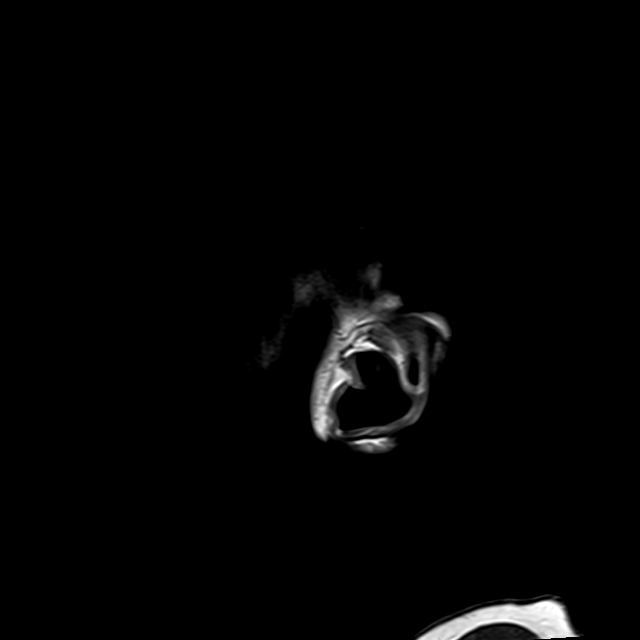

[Series 3: ax dwi_tracew · axial · 3.0mm · 0.65mm/px · z∈[-79,+76]mm · 4 of 96 slices shown]
[im 1/96]
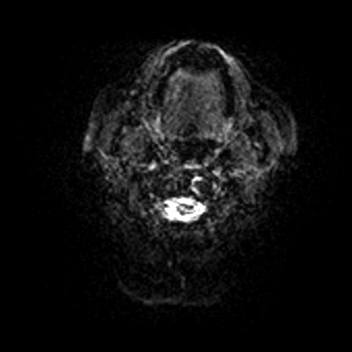
[im 32/96]
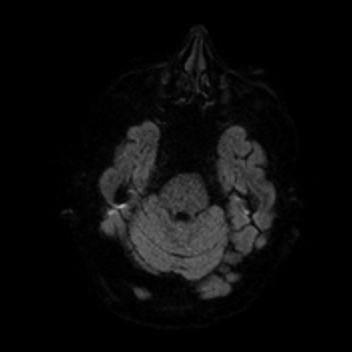
[im 64/96]
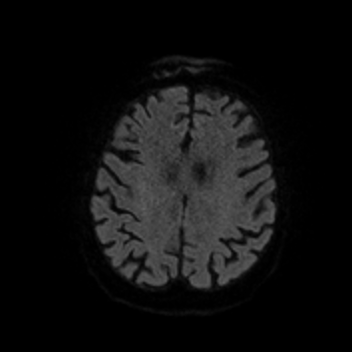
[im 96/96]
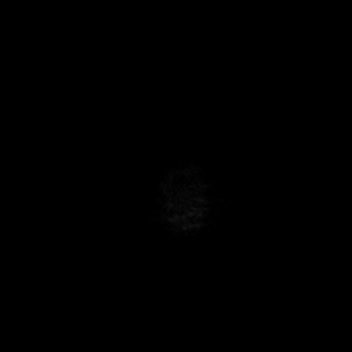

[Series 4: ax dwi_adc · axial · 3.0mm · 0.65mm/px · 1 of 47 slices shown]
[im 1/47]
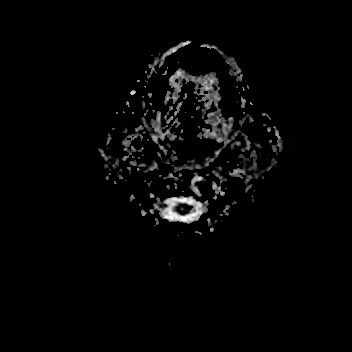

[Series 5: T2 · axial · 5.0mm · 0.53mm/px · 1 of 25 slices shown (1 of 2)]
[im 1/25]
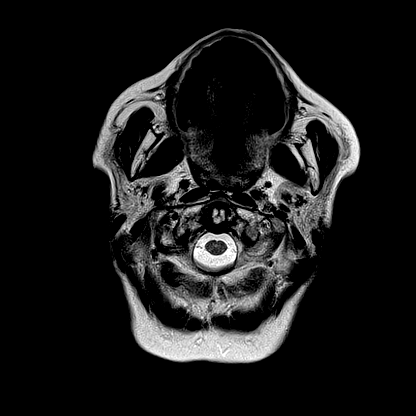

[Series 10: FLAIR · axial · 3.0mm · 0.53mm/px · z∈[-83,+79]mm · 2 of 55 slices shown]
[im 1/55]
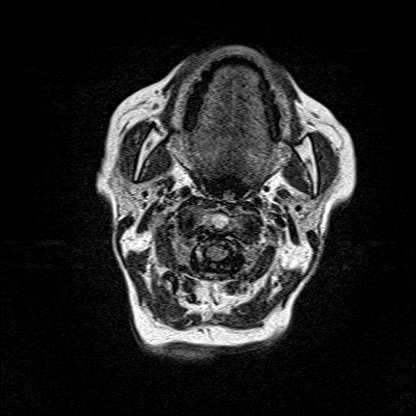
[im 55/55]
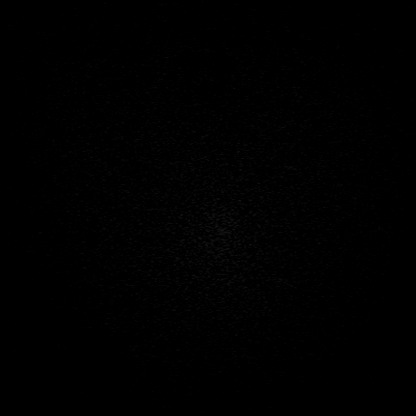

[Series 11: T2 · sagittal · non-contrast · 1.0mm · 0.98mm/px · 7 of 176 slices shown (2 of 2)]
[im 1/176]
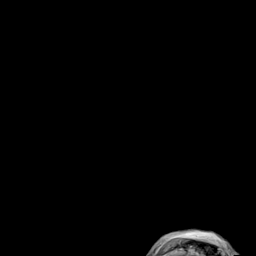
[im 30/176]
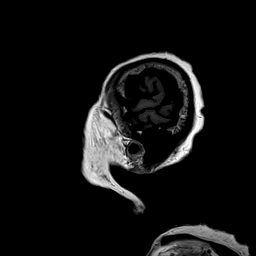
[im 59/176]
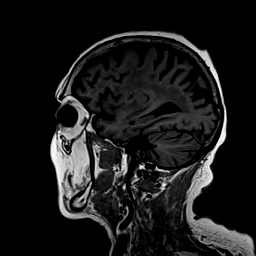
[im 88/176]
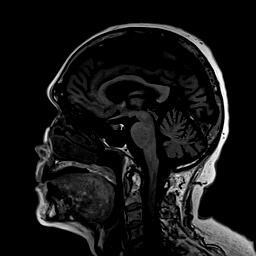
[im 117/176]
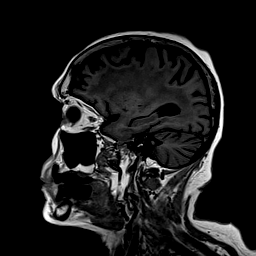
[im 146/176]
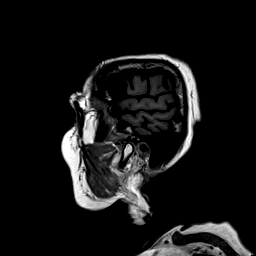
[im 176/176]
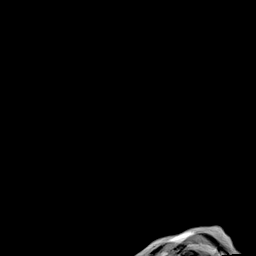

[Series 12: T2 post-contrast · coronal · 3.0mm · 0.57mm/px · 2 of 42 slices shown (1 of 2)]
[im 1/42]
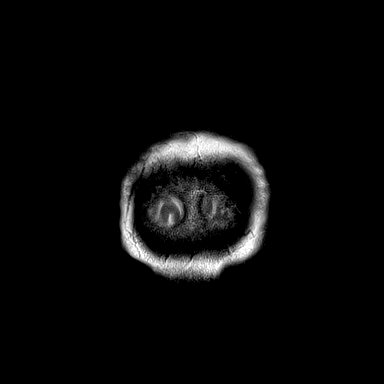
[im 42/42]
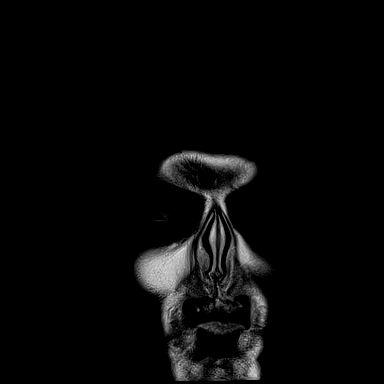

[Series 13: T2 post-contrast · sagittal · 1.0mm · 0.98mm/px · 7 of 176 slices shown (2 of 2)]
[im 1/176]
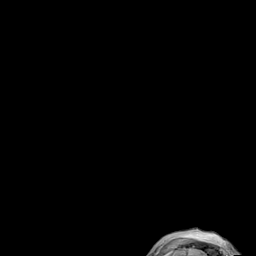
[im 30/176]
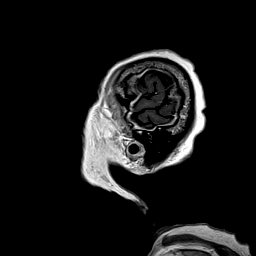
[im 59/176]
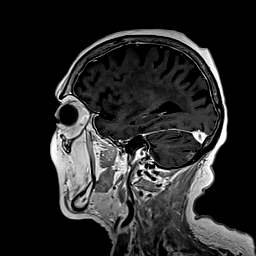
[im 88/176]
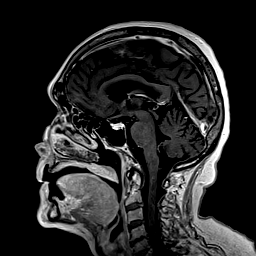
[im 117/176]
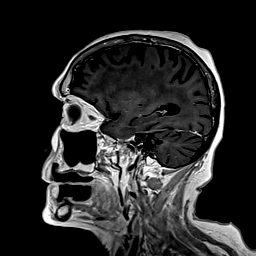
[im 146/176]
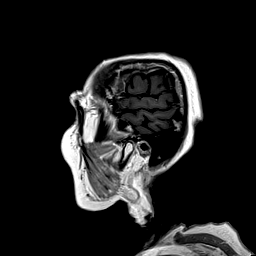
[im 176/176]
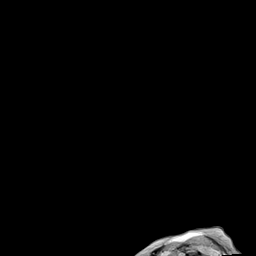

[Series 14: T1 post-contrast · coronal · 3.0mm · 0.29mm/px · 2 of 44 slices shown (1 of 2)]
[im 1/44]
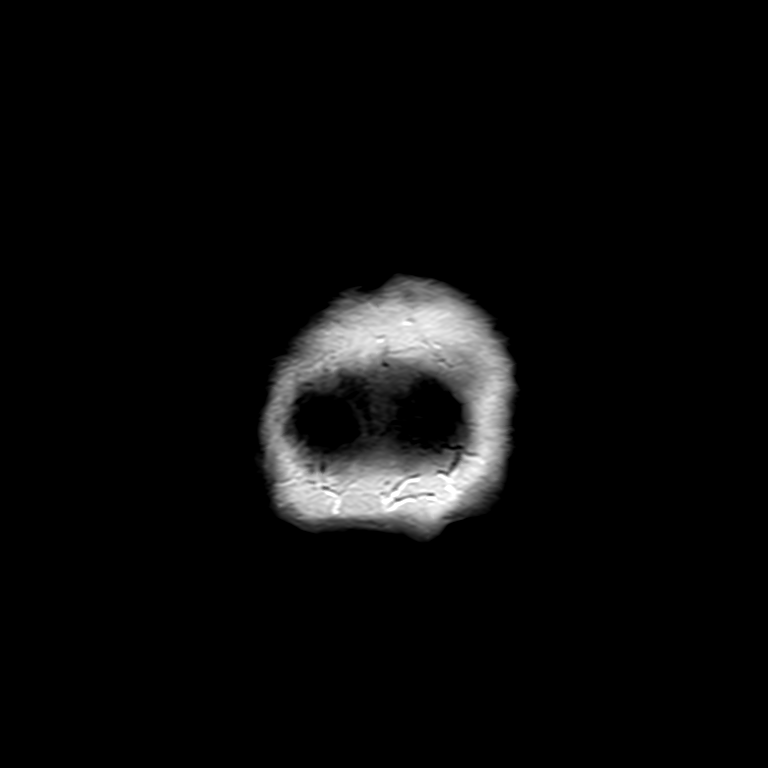
[im 44/44]
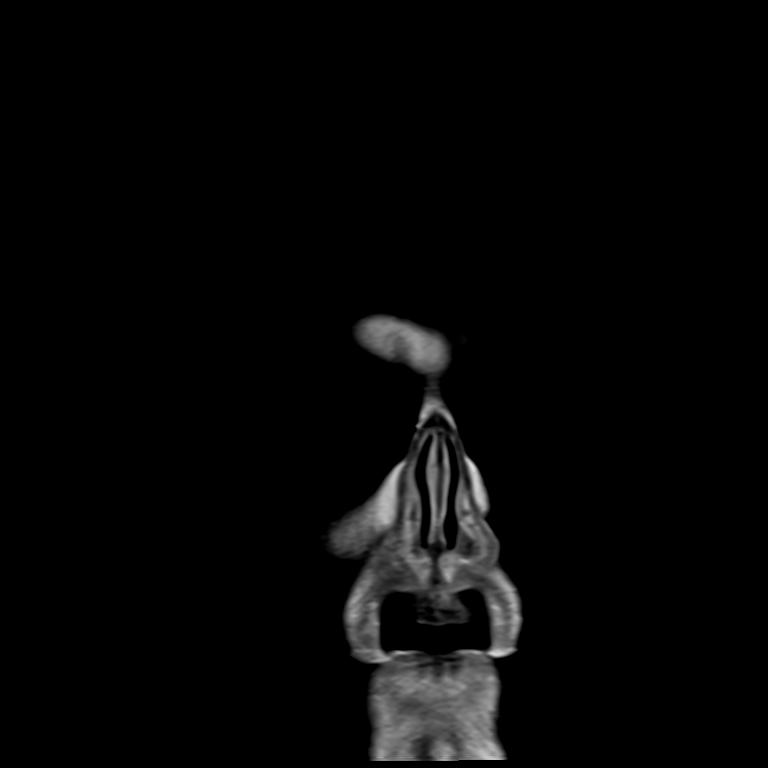

[Series 15: T1 post-contrast · sagittal · 3.0mm · 0.38mm/px · 1 of 37 slices shown (2 of 2)]
[im 1/37]
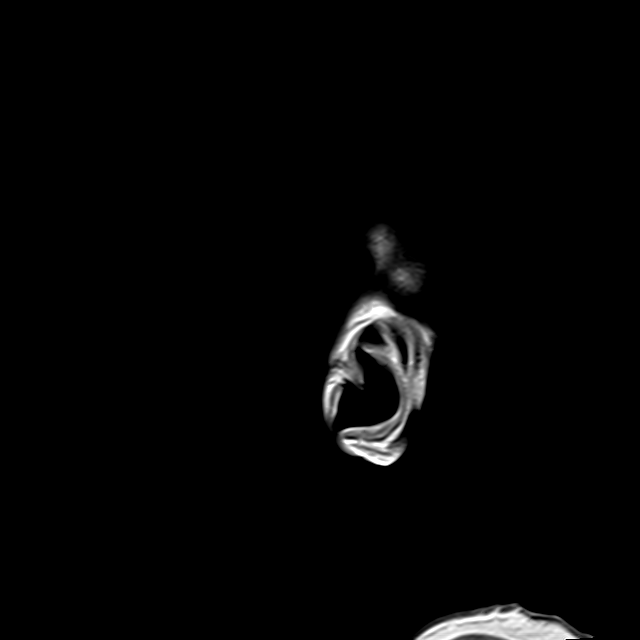

[29 of 48 positions shown; findings below may reference images not displayed]

FINDINGS: Brain: Multiple foci of restricted diffusion in the cerebellum
(series 3, images 56, 57, and 62). A focus in the left cerebellum
correlates with an enhancing lesion that measures up to 9 mm (series
6169, image 42, which is new from the prior exam and is associated
with increased T2 signal. Additional lesion in the right cerebellum
measures up to 6 mm (series 6169, image 139), also new from the
prior exam and associated with increased T2 signal.

An additional lesion more superiorly in the right cerebellum
correlates with a smaller focus of restricted diffusion seen on the
prior exam; evaluation for contrast enhancement in this lesion is
somewhat limited given its location in areas affected by pulsation
artifact on the coronal and sagittal T1 sequences (series 14, image
13 and series 15, image 13), with only minimal enhancement on the
axial SPGR (series 6169, image 122). This lesion are associated with
T2 hyperintense signal (series 10, image 16) and likely measures up
to 9 mm (series 15, image 14).

No other restricted diffusion. No dural thickening. No mass effect
or midline shift. No hydrocephalus or extra-axial collection.
Redemonstrated T2 hyperintense signal in the periventricular white
matter, likely related to prior radiation.

Vascular: Normal flow voids.

Skull and upper cervical spine: Normal marrow signal.

Sinuses/Orbits: Negative.

Other: Trace fluid in left mastoid air cells.
IMPRESSION: 1. Two definite new enhancing lesions in cerebellum, with associated
restricted diffusion and increased T2 signal, concerning for new
foci of metastatic disease.
2. A third lesion in the right cerebellar hemisphere was likely
present on the prior exam but continues to show limited contrast
enhancement on SPGR; this lesion is associated with diffusion
restriction and increased T2 signal, but, on coronal and sagittal T1
sequences, it correlates with an area affected by pulsation
artifact. This is also felt to represent a new focus of metastatic
disease.

## 2021-12-03 ENCOUNTER — Encounter: Payer: Self-pay | Admitting: *Deleted

## 2021-12-03 ENCOUNTER — Other Ambulatory Visit: Payer: Self-pay | Admitting: *Deleted

## 2021-12-03 DIAGNOSIS — C3490 Malignant neoplasm of unspecified part of unspecified bronchus or lung: Secondary | ICD-10-CM

## 2021-12-19 ENCOUNTER — Encounter: Payer: Self-pay | Admitting: Internal Medicine

## 2021-12-19 ENCOUNTER — Inpatient Hospital Stay (HOSPITAL_BASED_OUTPATIENT_CLINIC_OR_DEPARTMENT_OTHER): Payer: Medicare Other | Admitting: Internal Medicine

## 2021-12-19 ENCOUNTER — Other Ambulatory Visit: Payer: Self-pay

## 2021-12-19 VITALS — BP 109/70 | HR 88 | Temp 97.8°F | Resp 19 | Wt 150.5 lb

## 2021-12-19 DIAGNOSIS — Z79899 Other long term (current) drug therapy: Secondary | ICD-10-CM

## 2021-12-19 DIAGNOSIS — I639 Cerebral infarction, unspecified: Secondary | ICD-10-CM | POA: Insufficient documentation

## 2021-12-19 DIAGNOSIS — C3490 Malignant neoplasm of unspecified part of unspecified bronchus or lung: Secondary | ICD-10-CM | POA: Diagnosis not present

## 2021-12-19 DIAGNOSIS — Z5111 Encounter for antineoplastic chemotherapy: Secondary | ICD-10-CM

## 2021-12-19 DIAGNOSIS — R6889 Other general symptoms and signs: Secondary | ICD-10-CM

## 2021-12-19 DIAGNOSIS — Z5181 Encounter for therapeutic drug level monitoring: Secondary | ICD-10-CM

## 2021-12-19 DIAGNOSIS — I700 Atherosclerosis of aorta: Secondary | ICD-10-CM | POA: Diagnosis not present

## 2021-12-19 DIAGNOSIS — Z5189 Encounter for other specified aftercare: Secondary | ICD-10-CM

## 2021-12-19 DIAGNOSIS — Z5112 Encounter for antineoplastic immunotherapy: Secondary | ICD-10-CM | POA: Diagnosis not present

## 2021-12-19 DIAGNOSIS — Z87891 Personal history of nicotine dependence: Secondary | ICD-10-CM | POA: Diagnosis not present

## 2021-12-19 MED ORDER — ATORVASTATIN CALCIUM 40 MG PO TABS
40.0000 mg | ORAL_TABLET | Freq: Every day | ORAL | 3 refills | Status: DC
Start: 2021-12-19 — End: 2023-03-30

## 2021-12-19 MED ORDER — ASPIRIN 325 MG PO TBEC: 325.0000 mg | DELAYED_RELEASE_TABLET | Freq: Every day | ORAL | Status: DC

## 2021-12-19 NOTE — Progress Notes (Signed)
[facility] at [place], [place] [id number] [phone]   New Patient Evaluation  Date of Service: 12/19/21 Patient Name: Kristi [name] [facility] Patient MRN: [id number] Patient DOB: 11-10-1946 Provider: Ventura Sellers, MD  Identifying Statement:  Kristi [name] is a 75 y.o. female with Cerebrovascular accident (CVA), unspecified mechanism ([place]) who presents for initial consultation and evaluation regarding cancer associated neurologic deficits.    Referring Provider: [name], MD [place]   [address]   [place],  [place] [address]  Primary Cancer:  Oncologic History: Oncology History Overview Note  Kristi [name] is a  75 y.o.  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   10/20/2018 CT chest with contrast showed large mediastinal mass involving both hilar, left greater than right, consistent with lung carcinoma, The mass causes narrowing of the left mainstem bronchus with resultant volume loss on the left and a mediastinal shift to the left.  Moderate size left pleural effusion. 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   # Nov 2019- Jan 2020 s/p 4 cycles of Carbo/Etoposide/Tecentriq #  01/24/2019 interim CT scan done which showed continued positive response to therapy with continued reduction in mediastinal adenopathy.  No residual measurable left lung mass. CT findings of acute emphysematous cystitis. Urology did not feel that patient has pyelonephritis and recommend treatment with antibiotics because patient's immunocompromise. Patient finished treatment.    # Chest and whole brain radiation finished in May 2020 # 04/25/2019 resume Tecentriq every 3 weeks.   Small cell lung cancer ([place])  10/27/2018 Initial Diagnosis   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 Kristi [name])   11/02/2018 -   Chemotherapy   Patient is on Treatment Plan : LUNG SCLC Carboplatin + Etoposide + Atezolizumab Induction q21d / Atezolizumab Maintenance q21d      CNS Oncologic History 04/19/19: Completes PCI 25/10 (Kristi [name])  History of Present Illness: The patient's records from the referring physician were obtained and reviewed and the patient interviewed to confirm this HPI.  Kristi [name] [facility] presents today to review recent finding of stroke on radiation planning MRI.  Dr. Donella Stade had obtained a brain scan in November which demonstrated 3 apparent novel metastases.  Radiosurgery planning study later in December actually demonstrated resolution, with features consistent with vascluar/infarct rather than tumor.  She complains of ongoing short term memory impairment, fatigue, some mood symptoms.  She did not have any acute neurologic complaints or focal neurologic deficits leading to CNS imaging, continues to deny any today.    Medications: Current Outpatient Medications on File Prior to Visit  Medication Sig Dispense Refill   acetaminophen (TYLENOL) 500 MG tablet Take 2 tablets (1,000 mg total) by mouth every 6 (six) hours as needed for mild pain or headache.     aspirin EC 81 MG tablet Take 1 tablet (81 mg total) by mouth daily. Swallow whole. 30 tablet 0   atorvastatin (LIPITOR) 10 MG tablet Take 1 tablet (10 mg total) by mouth daily. 90 tablet 3   Calcium 600-200 MG-UNIT tablet Take 1 tablet by mouth 2 (two) times daily.     Cholecalciferol (DIALYVITE VITAMIN D 5000) 125 MCG (5000 UT) capsule Take 5,000 Units by mouth daily.     dexamethasone (DECADRON) 2 MG tablet Take 2 tablets (4 mg total) by mouth 2 (two) times daily. 4 mg BID x  3 days, 4 mg daily x3 days, 2 mg daily x days. 21 tablet 0   lidocaine-prilocaine (EMLA) cream Apply 1 application topically daily as needed (port access). 30 g 2   Magnesium 250 MG TABS Take 250 mg by mouth daily.     ondansetron (ZOFRAN) 8 MG tablet Take 1 tablet (8 mg total)  by mouth every 8 (eight) hours as needed for nausea or vomiting. 60 tablet 1   oxyCODONE (ROXICODONE) 5 MG immediate release tablet Take 1 tablet (5 mg total) by mouth every 12 (twelve) hours as needed for severe pain. 30 tablet 0   pantoprazole (PROTONIX) 40 MG tablet Take 1 tablet (40 mg total) by mouth 2 (two) times daily. 60 tablet 0   prochlorperazine (COMPAZINE) 10 MG tablet Take 10 mg by mouth every 6 (six) hours as needed for nausea or vomiting.     sertraline (ZOLOFT) 100 MG tablet Take 1 tablet (100 mg total) by mouth daily. 90 tablet 3   sucralfate (CARAFATE) 1 g tablet Take 1 g by mouth 3 (three) times daily as needed (heartburn).     traZODone (DESYREL) 50 MG tablet Take 1 tablet (50 mg total) by mouth at bedtime as needed for sleep. 30 tablet 2   vitamin B-12 (CYANOCOBALAMIN) 1000 MCG tablet Take 1,000 mcg by mouth daily.     Current Facility-Administered Medications on File Prior to Visit  Medication Dose Route Frequency Provider Last Rate Last Admin   heparin lock flush 100 unit/mL  500 Units Intravenous Once Earlie Server, MD       heparin lock flush 100 unit/mL  500 Units Intravenous Once Earlie Server, MD       sodium chloride flush (NS) 0.9 % injection 10 mL  10 mL Intravenous PRN Earlie Server, MD   10 mL at 01/09/19 0820   sodium chloride flush (NS) 0.9 % injection 10 mL  10 mL Intravenous PRN Earlie Server, MD   10 mL at 01/10/19 1400    Allergies: No Known Allergies Past Medical History:  Past Medical History:  Diagnosis Date   Acute MI, inferoposterior wall ([place]) 09/30/2014   1 stent   Claustrophobia    Coronary artery disease    GERD (gastroesophageal reflux disease)    Hypercholesteremia    MI, old    Pneumonia    Small cell lung cancer ([place])    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 ([place]) 10/27/2018   Past Surgical History:  Past Surgical History:  Procedure Laterality Date   APPENDECTOMY     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   BLADDER NECK SUSPENSION     CHOLECYSTECTOMY N/A 08/14/2018    Procedure: LAPAROSCOPIC CHOLECYSTECTOMY;  Surgeon: Jules Husbands, MD;  Location: [facility] ORS;  Service: General;  Laterality: N/A;   CHOLECYSTECTOMY  11/2018   CORONARY ANGIOPLASTY  09/29/2014   CORONARY STENT PLACEMENT     ENDOBRONCHIAL ULTRASOUND N/A 10/21/2018   Procedure: ENDOBRONCHIAL ULTRASOUND;  Surgeon: Laverle Hobby, MD;  Location: [facility] ORS;  Service: Pulmonary;  Laterality: N/A;   HERNIA REPAIR  2012   IR FLUORO GUIDE CV LINE RIGHT  12/19/2018   KYPHOPLASTY N/A 03/01/2020   Procedure: T7 KYPHOPLASTY;  Surgeon: Hessie Knows, MD;  Location: [facility] ORS;  Service: Orthopedics;  Laterality: N/A;   LAPAROTOMY     LEFT HEART CATHETERIZATION WITH CORONARY ANGIOGRAM N/A 09/29/2014   Procedure: LEFT HEART CATHETERIZATION WITH CORONARY ANGIOGRAM;  Surgeon: Clent Demark, MD;  Location: [facility] CATH LAB;  Service: Cardiovascular;  Laterality: N/A;   PORTA CATH INSERTION N/A 01/02/2019   Procedure: PORTA CATH INSERTION;  Surgeon: Algernon Huxley, MD;  Location: [facility] CV LAB;  Service: Cardiovascular;  Laterality: N/A;   PORTACATH PLACEMENT Right 10/28/2018   Procedure: INSERTION PORT-A-CATH;  Surgeon: Jules Husbands, MD;  Location: [facility] ORS;  Service: General;  Laterality: Right;   XI ROBOTIC ASSISTED VENTRAL HERNIA N/A 02/27/2021   Procedure: XI ROBOTIC ASSISTED VENTRAL HERNIA (incisional) WITH MESH;  Surgeon: Olean Ree, MD;  Location: [facility] ORS;  Service: General;  Laterality: N/A;   Social History:  Social History   Socioeconomic History   Marital status: Married    Spouse name: [name]    Number of children: 3   Years of education: Not on file   Highest education level: Not on file  Occupational History   Occupation: Retired    Comment: Chief Technology Officer   Tobacco Use   Smoking status: Former    Packs/day: 1.00    Years: 39.00    Pack years: 39.00    Types: Cigarettes    Start date: 12/21/1978    Quit date: 10/16/2018    Years since quitting: 3.1   Smokeless tobacco: Never   Vaping Use   Vaping Use: Never used  Substance and Sexual Activity   Alcohol use: Yes    Alcohol/week: 0.0 standard drinks    Comment: occassional - approx 1 every 2 weeks    Drug use: No   Sexual activity: Yes    Birth control/protection: None  Other Topics Concern   Not on file  Social History Narrative   Not on file   Social Determinants of Health   Financial Resource Strain: Not on file  Food Insecurity: Not on file  Transportation Needs: Not on file  Physical Activity: Not on file  Stress: Not on file  Social Connections: Not on file  Intimate Partner Violence: Not on file   Family History:  Family History  Problem Relation Age of Onset   Alzheimer's disease Mother    Colon cancer Father    Breast cancer Neg Hx     Review of Systems: Constitutional: Doesn't report fevers, chills or abnormal weight loss Eyes: Doesn't report blurriness of vision Ears, nose, mouth, throat, and face: Doesn't report sore throat Respiratory: Doesn't report cough, dyspnea or wheezes Cardiovascular: Doesn't report palpitation, chest discomfort  Gastrointestinal:  Doesn't report nausea, constipation, diarrhea GU: Doesn't report incontinence Skin: Doesn't report skin rashes Neurological: Per HPI Musculoskeletal: Doesn't report joint pain Behavioral/Psych: Doesn't report anxiety  Physical Exam: Vitals:   12/19/21 0949  BP: 109/70  Pulse: 88  Resp: 19  Temp: 97.8 F (36.6 C)  SpO2: 99%   KPS: 80. General: Alert, cooperative, pleasant, in no acute distress Head: Normal EENT: No conjunctival injection or scleral icterus.  Lungs: Resp effort normal Cardiac: Regular rate Abdomen: Non-distended abdomen Skin: No rashes cyanosis or petechiae. Extremities: No clubbing or edema  Neurologic Exam: Mental Status: Awake, alert, attentive to examiner. Oriented to self and environment. Language is fluent with intact comprehension.  Cranial Nerves: Visual acuity is grossly normal. Visual  fields are full. Extra-ocular movements intact. No ptosis. Face is symmetric Motor: Tone and bulk are normal. Power is full in both arms and legs. Reflexes are symmetric, no pathologic reflexes present.  Sensory: Intact to light touch Gait: mild dystaxia   Labs: I have reviewed the data as listed    Component Value Date/Time   NA 135 12/01/2021 0809  NA 136 09/19/2018 1038   K 3.6 12/01/2021 0809   CL 102 12/01/2021 0809   CO2 24 12/01/2021 0809   GLUCOSE 108 (H) 12/01/2021 0809   BUN 20 12/01/2021 0809   BUN 6 (L) 09/19/2018 1038   CREATININE 0.93 12/01/2021 0809   CALCIUM 9.3 12/01/2021 0809   PROT 7.1 12/01/2021 0809   PROT 7.4 09/19/2018 1038   ALBUMIN 3.9 12/01/2021 0809   ALBUMIN 4.3 09/19/2018 1038   AST 24 12/01/2021 0809   ALT 18 12/01/2021 0809   ALKPHOS 45 12/01/2021 0809   BILITOT 0.4 12/01/2021 0809   BILITOT 0.5 09/19/2018 1038   GFRNONAA >60 12/01/2021 0809   GFRAA >60 09/23/2020 0845   Lab Results  Component Value Date   WBC 6.4 12/01/2021   NEUTROABS 3.6 12/01/2021   HGB 11.8 (L) 12/01/2021   HCT 35.3 (L) 12/01/2021   MCV 89.6 12/01/2021   PLT 185 12/01/2021    Imaging:  MR Brain W Wo Contrast  Result Date: 11/22/2021 CLINICAL DATA:  Follow-up metastatic disease. Whole-brain radiotherapy with new cerebellar lesions under consideration for focus treatment. EXAM: MRI HEAD WITHOUT AND WITH CONTRAST TECHNIQUE: Multiplanar, multiecho pulse sequences of the brain and surrounding structures were obtained without and with intravenous contrast. CONTRAST:  7.57mL GADAVIST GADOBUTROL 1 MMOL/ML IV SOLN COMPARISON:  10/28/2021 FINDINGS: Brain: The areas of cerebellar enhancement have resolved and there is now cleft like T2 hyperintensity in these areas compatible with prior infarcts. There is a background of extensive chronic small vessel ischemia specially in the supratentorial white matter in the setting of prior whole-brain radiotherapy. Pontine and deep gray  nuclei ischemic changes well. No hydrocephalus, collection, or swelling. Vascular: Normal flow voids and vascular enhancement. Skull and upper cervical spine: Normal marrow signal Sinuses/Orbits: Negative IMPRESSION: 1. Resolved sites of cerebellar enhancement best attributed to maturing infarcts. No enhancing metastatic disease seen today. 2. Chronic small vessel ischemia. Electronically Signed   By: Jorje Guild M.D.   On: 11/22/2021 08:53   NM PET Image Restag (PS) Skull Base To Thigh  Result Date: 11/21/2021 CLINICAL DATA:  Subsequent treatment strategy for lung cancer with ongoing therapy found to have metastatic disease to the brain. EXAM: NUCLEAR MEDICINE PET SKULL BASE TO THIGH TECHNIQUE: 7.7 mCi F-18 FDG was injected intravenously. Full-ring PET imaging was performed from the skull base to thigh after the radiotracer. CT data was obtained and used for attenuation correction and anatomic localization. Fasting blood glucose: 73 mg/dl COMPARISON:  Multiple prior studies most recent comparison imaging of the chest abdomen and pelvis study from October 17, 2021. FINDINGS: Mediastinal blood pool activity: SUV max 2.85 Liver activity: SUV max NA NECK: No hypermetabolic lymph nodes in the neck. Incidental CT findings: CNS metastases not well seen, refer to prior MRI for additional detail. CHEST: No hypermetabolic mediastinal or hilar nodes. No suspicious pulmonary nodules on the CT scan. Incidental CT findings: RIGHT-sided Port-A-Cath terminates at the caval to atrial junction. Heart size is normal. Aortic atherosclerosis without aneurysm. Normal caliber of the central pulmonary vasculature. Limited assessment of cardiovascular structures given lack of intravenous contrast. Lungs are clear. Mild pulmonary emphysema. Scarring at the LEFT lung base related to prior post treatment changes. Airways are patent. ABDOMEN/PELVIS: No abnormal hypermetabolic activity within the liver, pancreas, adrenal glands, or  spleen. No hypermetabolic lymph nodes in the abdomen or pelvis. Incidental CT findings: Post cholecystectomy. No acute findings relative to liver, spleen, pancreas, adrenal glands, kidneys, urinary bladder, stomach, small or large bowel.  Findings of small hiatal hernia. Evidence of previous ventral midline abdominal wall reconstruction/hernia repair. Signs of colonic diverticulosis. Aortic atheromatous plaque with heavy calcification of the abdominal aorta without aneurysmal dilation. Signs of cystocele with descent of the bladder base below the symphysis pubis. The reproductive structures grossly unremarkable. SKELETON: No focal hypermetabolic activity to suggest skeletal metastasis. Incidental CT findings: Degenerative changes throughout the spine. Signs of osteopenia. Cement augmentation related to compression fracture in the midthoracic spine as seen on previous imaging IMPRESSION: No FDG avid disease in the neck, chest, abdomen or pelvis. Signs of pulmonary emphysema and evidence of prior therapy involving the LEFT lower lobe. Aortic atherosclerosis and coronary artery calcification. Thoracic spine compression fractures 1 post cement augmentation as on recent CT imaging. 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 Electronically Signed   By: Zetta Bills M.D.   On: 11/21/2021 14:59     Assessment/Plan Cerebrovascular accident (CVA), unspecified mechanism ([place])  Torii Royse [facility] presents with clinical and radiographic syndrome localizing to the posterior fossa.  Etiology is multiple infarcts within cerebellum, source of infarcts is unknown at this time.  She does have vascular risk factors, including cancer diagnosis and neurovascular radiation exposure.   We recommended, at minimum, a transthoracic ultrasound to rule out thrombus or valvular vegetation.  She does not exhibit irregular cardiac rhythm today suggestive of a-fib.    Will defer neurovascular imaging and instead  focus on maximizing secondary prevention.  Recommended increasing ASA to 325mg  daily, and atorvastatin to high dose (40mg ).  We counseled her regarding diet, exercise, sleep and their role in preventing stroke.  She will continue rehab exercises as arranged by her PCP, she understands the importance of this.   We spent twenty additional minutes teaching regarding the natural history, biology, and historical experience in the treatment of neurologic complications of cancer.   We appreciate the opportunity to participate in the care of Gen[facility] Kiner [facility].  We will touch base with her via phone following TTE.  All questions were answered. The patient knows to call the clinic with any problems, questions or concerns. No barriers to learning were detected.  The total time spent in the encounter was 40 minutes and more than 50% was on counseling and review of test results   Ventura Sellers, MD Medical Director of Neuro-Oncology [facility] at [place] 12/19/21 9:49 AM

## 2021-12-24 ENCOUNTER — Ambulatory Visit: Payer: Medicare Other

## 2021-12-24 ENCOUNTER — Inpatient Hospital Stay: Payer: Medicare Other | Attending: Oncology

## 2021-12-24 ENCOUNTER — Other Ambulatory Visit: Payer: Self-pay

## 2021-12-24 ENCOUNTER — Inpatient Hospital Stay (HOSPITAL_BASED_OUTPATIENT_CLINIC_OR_DEPARTMENT_OTHER): Payer: Medicare Other | Admitting: Oncology

## 2021-12-24 ENCOUNTER — Inpatient Hospital Stay: Payer: Medicare Other

## 2021-12-24 ENCOUNTER — Encounter: Payer: Self-pay | Admitting: Oncology

## 2021-12-24 VITALS — Resp 20

## 2021-12-24 VITALS — BP 116/81 | HR 90 | Temp 97.7°F | Wt 150.1 lb

## 2021-12-24 DIAGNOSIS — C3490 Malignant neoplasm of unspecified part of unspecified bronchus or lung: Secondary | ICD-10-CM | POA: Diagnosis not present

## 2021-12-24 DIAGNOSIS — Z5112 Encounter for antineoplastic immunotherapy: Secondary | ICD-10-CM | POA: Insufficient documentation

## 2021-12-24 DIAGNOSIS — R5383 Other fatigue: Secondary | ICD-10-CM | POA: Diagnosis not present

## 2021-12-24 DIAGNOSIS — Z87891 Personal history of nicotine dependence: Secondary | ICD-10-CM | POA: Diagnosis not present

## 2021-12-24 DIAGNOSIS — I639 Cerebral infarction, unspecified: Secondary | ICD-10-CM

## 2021-12-24 DIAGNOSIS — M8580 Other specified disorders of bone density and structure, unspecified site: Secondary | ICD-10-CM | POA: Diagnosis not present

## 2021-12-24 DIAGNOSIS — R42 Dizziness and giddiness: Secondary | ICD-10-CM | POA: Insufficient documentation

## 2021-12-24 LAB — CBC WITH DIFFERENTIAL/PLATELET
Abs Immature Granulocytes: 0.05 10*3/uL (ref 0.00–0.07)
Basophils Absolute: 0.1 10*3/uL (ref 0.0–0.1)
Basophils Relative: 1 %
Eosinophils Absolute: 0.3 10*3/uL (ref 0.0–0.5)
Eosinophils Relative: 5 %
HCT: 36.2 % (ref 36.0–46.0)
Hemoglobin: 12 g/dL (ref 12.0–15.0)
Immature Granulocytes: 1 %
Lymphocytes Relative: 23 %
Lymphs Abs: 1.3 10*3/uL (ref 0.7–4.0)
MCH: 30 pg (ref 26.0–34.0)
MCHC: 33.1 g/dL (ref 30.0–36.0)
MCV: 90.5 fL (ref 80.0–100.0)
Monocytes Absolute: 0.9 10*3/uL (ref 0.1–1.0)
Monocytes Relative: 15 %
Neutro Abs: 3.2 10*3/uL (ref 1.7–7.7)
Neutrophils Relative %: 55 %
Platelets: 180 10*3/uL (ref 150–400)
RBC: 4 MIL/uL (ref 3.87–5.11)
RDW: 13.6 % (ref 11.5–15.5)
WBC: 5.8 10*3/uL (ref 4.0–10.5)
nRBC: 0 % (ref 0.0–0.2)

## 2021-12-24 LAB — COMPREHENSIVE METABOLIC PANEL
ALT: 25 U/L (ref 0–44)
AST: 32 U/L (ref 15–41)
Albumin: 3.9 g/dL (ref 3.5–5.0)
Alkaline Phosphatase: 63 U/L (ref 38–126)
Anion gap: 7 (ref 5–15)
BUN: 21 mg/dL (ref 8–23)
CO2: 23 mmol/L (ref 22–32)
Calcium: 9 mg/dL (ref 8.9–10.3)
Chloride: 104 mmol/L (ref 98–111)
Creatinine, Ser: 0.87 mg/dL (ref 0.44–1.00)
GFR, Estimated: >60 mL/min (ref >60)
Glucose, Bld: 97 mg/dL (ref 70–99)
Potassium: 3.8 mmol/L (ref 3.5–5.1)
Sodium: 134 mmol/L — ABNORMAL LOW (ref 135–145)
Total Bilirubin: 0.7 mg/dL (ref 0.3–1.2)
Total Protein: 7.4 g/dL (ref 6.5–8.1)

## 2021-12-24 MED ORDER — HEPARIN SOD (PORK) LOCK FLUSH 100 UNIT/ML IV SOLN
500.0000 [IU] | Freq: Once | INTRAVENOUS | Status: AC
  Filled 2021-12-24: qty 5

## 2021-12-24 MED ORDER — SODIUM CHLORIDE 0.9 % IV SOLN
1200.0000 mg | Freq: Once | INTRAVENOUS | Status: AC
  Administered 2021-12-24: 1200 mg via INTRAVENOUS
  Filled 2021-12-24: qty 20

## 2021-12-24 MED ORDER — SODIUM CHLORIDE 0.9 % IV SOLN
Freq: Once | INTRAVENOUS | Status: AC
  Filled 2021-12-24: qty 250

## 2021-12-24 MED ORDER — SODIUM CHLORIDE 0.9% FLUSH
10.0000 mL | INTRAVENOUS | Status: DC | PRN
  Administered 2021-12-24: 10 mL
  Filled 2021-12-24: qty 10

## 2021-12-24 MED ORDER — SODIUM CHLORIDE 0.9% FLUSH
10.0000 mL | Freq: Once | INTRAVENOUS | Status: AC
  Administered 2021-12-24: 10 mL via INTRAVENOUS
  Filled 2021-12-24: qty 10

## 2021-12-24 MED ORDER — ACETAMINOPHEN 325 MG PO TABS
650.0000 mg | ORAL_TABLET | Freq: Once | ORAL | Status: AC
  Administered 2021-12-24: 650 mg via ORAL
  Filled 2021-12-24: qty 2

## 2021-12-24 MED ORDER — HEPARIN SOD (PORK) LOCK FLUSH 100 UNIT/ML IV SOLN
INTRAVENOUS | Status: AC
  Administered 2021-12-24: 500 [IU] via INTRAVENOUS
  Filled 2021-12-24: qty 5

## 2021-12-24 NOTE — Patient Instructions (Signed)
[facility] AT [place]   Discharge Instructions: Thank you for choosing [facility] to provide your oncology and hematology care.  If you have a lab appointment with the [facility], please go directly to the [facility] and check in at the registration area.   Wear comfortable clothing and clothing appropriate for easy access to any Portacath or PICC line.   We strive to give you quality time with your provider. You may need to reschedule your appointment if you arrive late (15 or more minutes).  Arriving late affects you and other patients whose appointments are after yours.  Also, if you miss three or more appointments without notifying the office, you may be dismissed from the clinic at the providers discretion.      For prescription refill requests, have your pharmacy contact our office and allow 72 hours for refills to be completed.    Today you received the following chemotherapy and/or immunotherapy agents: Tecentriq.      To help prevent nausea and vomiting after your treatment, we encourage you to take your nausea medication as directed.  BELOW ARE SYMPTOMS THAT SHOULD BE REPORTED IMMEDIATELY: *FEVER GREATER THAN 100.4 F (38 C) OR HIGHER *CHILLS OR SWEATING *NAUSEA AND VOMITING THAT IS NOT CONTROLLED WITH YOUR NAUSEA MEDICATION *UNUSUAL SHORTNESS OF BREATH *UNUSUAL BRUISING OR BLEEDING *URINARY PROBLEMS (pain or burning when urinating, or frequent urination) *BOWEL PROBLEMS (unusual diarrhea, constipation, pain near the anus) TENDERNESS IN MOUTH AND THROAT WITH OR WITHOUT PRESENCE OF ULCERS (sore throat, sores in mouth, or a toothache) UNUSUAL RASH, SWELLING OR PAIN  UNUSUAL VAGINAL DISCHARGE OR ITCHING   Items with * indicate a potential emergency and should be followed up as soon as possible or go to the Emergency Department if any problems should occur.  Please show the CHEMOTHERAPY ALERT CARD or IMMUNOTHERAPY ALERT CARD at check-in  to the Emergency Department and triage nurse.  Should you have questions after your visit or need to cancel or reschedule your appointment, please contact [facility] AT [place]  Dept: [phone]  and follow the prompts.  Office hours are 8:00 a.m. to 4:30 p.m. Monday - Friday. Please note that voicemails left after 4:00 p.m. may not be returned until the following business day.  We are closed weekends and major holidays. You have access to a nurse at all times for urgent questions. Please call the main number to the clinic Dept: [phone] and follow the prompts.  For any non-urgent questions, you may also contact your provider using MyChart. We now offer e-Visits for anyone 64 and older to request care online for non-urgent symptoms. For details visit [email or web address].   Also download the MyChart app! Go to the app store, search "MyChart", open the app, select [facility] [place], and log in with your MyChart username and password.  Due to Covid, a mask is required upon entering the hospital/clinic. If you do not have a mask, one will be given to you upon arrival. For doctor visits, patients may have 1 support person aged 29 or older with them. For treatment visits, patients cannot have anyone with them due to current Covid guidelines and our immunocompromised population.

## 2021-12-24 NOTE — Progress Notes (Signed)
Hematology/Oncology Follow up note [phone] [phone]   Patient Care Team: Glean Hess, MD as PCP - General (Internal Medicine) Charolette Forward, MD as Consulting Physician (Cardiology) Telford Nab, RN as Registered Nurse [name], MD as Radiation Oncologist (Radiation Oncology)   REASON FOR VISIT:  Follow-up for small cell lung cancer,   HISTORY OF PRESENTING ILLNESS:  Kristi [name] is a  76 y.o.  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  10/20/2018 CT chest with contrast showed large mediastinal mass involving both hilar, left greater than right, consistent with lung carcinoma, The mass causes narrowing of the left mainstem bronchus with resultant volume loss on the left and a mediastinal shift to the left.  Moderate size left pleural effusion. 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  # Initial MRI brain negative.  # Nov 2019- Jan 2020 s/p 4 cycles of Carbo/Etoposide/Tecentriq #  01/24/2019 interim CT scan done which showed continued positive response to therapy with continued reduction in mediastinal adenopathy.  No residual measurable left lung mass. CT findings of acute emphysematous cystitis. Urology did not feel that patient has pyelonephritis and recommend treatment with antibiotics because patient's immunocompromise. Patient finished treatment.   # 11/02/2018-01/09/2019 Chemotherapy carboplatin + Etoposide + tecentriq # Consolidation chest radiation and whole brain radiation finished in May 2020 # 04/25/2019 resume Tecentriq every 3 weeks. # Patient had a fall from her stairs on 01/19/2020. In the emergency room she had CT chest abdomen pelvis CT, CT head without contrast, CT cervical spine without contrast. No CT evidence for acute intracranial abnormality, degenerative changes of cervical spine..  No CT  evidence of acute thoracic abdomen injury.  Severe compression fracture of T7, subacute.  # kyphoplasty procedure on 03/01/2020.  T7 biopsy negative for cancer.  11/20/2020 Surveillance CT scan  Reduced size and prominence of right lateral lung base subpleural nodular density. No findings of active malignancy.  MRI brain showed stable post treatment brain. No metastatic disease.   # 10/29/2021 MRI brain w and wo contrast showed two definite new enhancing lesions in cerebellum, with associated restricted diffusion and increased T2 signal, concerning for new foci of metastatic disease. 2. A third lesion in the right cerebellar hemisphere was likely present on the prior exam but continues to show limited contrast enhancement on SPGR; this lesion is associated with diffusion restriction and increased T2 signal, but, on coronal and sagittal T1 sequences, it correlates with an area affected by pulsation artifact. This is also felt to represent a new focus of metastatic disease.  11/20/2021 PET scan - no FDG avid disease in neck chest abdomen or pelvis.  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 No enhancing metastatic disease seen today, chronic small vessel ischemia.   INTERVAL HISTORY Kristi [name] is a 76 y.o. female who has above history reviewed by me today presents for evaluation prior to immunotherapy for treatment of extensive small cell lung cancer. She feels well today. No new complaints.   12/19/2021 was seen by neurology Dr.Vaslow. increase ASA to 325mg  daily. Atorvastatin.    Review of Systems  Constitutional:  Positive for fatigue. Negative for appetite change, chills and fever.  HENT:   Negative for hearing loss and voice change.   Eyes:  Negative for eye problems.  Respiratory:  Negative for chest tightness, cough and shortness of breath.   Cardiovascular:  Negative  for chest pain.  Gastrointestinal:  Negative for abdominal distention, abdominal pain, blood in stool, diarrhea and nausea.  Endocrine: Negative for hot flashes.  Genitourinary:  Negative for difficulty urinating and frequency.   Musculoskeletal:  Negative for arthralgias and back pain.  Skin:  Negative for itching and rash.  Neurological:  Negative for extremity weakness and headaches.  Hematological:  Negative for adenopathy.  Psychiatric/Behavioral:  Negative for confusion and depression. The patient is not nervous/anxious.        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[place]) 09/30/2014   1 stent   Claustrophobia    Coronary artery disease    GERD (gastroesophageal reflux disease)    Hypercholesteremia    MI, old    Pneumonia    Small cell lung cancer ([place])    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 ([place]) 10/27/2018    SURGICAL HISTORY: Past Surgical History:  Procedure Laterality Date   APPENDECTOMY     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   BLADDER NECK SUSPENSION     CHOLECYSTECTOMY N/A 08/14/2018   Procedure: LAPAROSCOPIC CHOLECYSTECTOMY;  Surgeon: Jules Husbands, MD;  Location: [facility] ORS;  Service: General;  Laterality: N/A;   CHOLECYSTECTOMY  11/2018   CORONARY ANGIOPLASTY  09/29/2014   CORONARY STENT PLACEMENT     ENDOBRONCHIAL ULTRASOUND N/A 10/21/2018   Procedure: ENDOBRONCHIAL ULTRASOUND;  Surgeon: Laverle Hobby, MD;  Location: [facility] OR[facility];  Service: Pulmonary;  Laterality: N/A;   HERNIA REPAIR  2012   IR FLUORO GUIDE CV LINE RIGHT  12/19/2018   KYPHOPLASTY N/A 03/01/2020   Procedure: T7 KYPHOPLASTY;  Surgeon: Hessie Knows, MD;  Location: [facility];  Service: Orthopedics;  Laterality: N/A;   LAPAROTOMY     LEFT HEART CATHETERIZATION WITH CORONARY ANGIOGRAM N/A 09/29/2014   Procedure: LEFT HEART CATHETERIZATION WITH CORONARY ANGIOGRAM;  Surgeon: Clent Demark, MD;  Location: [place] [facility]ATH [facility];  Service: Cardiovascular;   Laterality: N/A;   PORTA CATH INSERTION N/A 01/02/2019   Procedure: PORTA CATH INSERTION;  Surgeon: Algernon Huxley, MD;  Location: [facility] [facility];  Service: Cardiovascular;  Laterality: N/A;   PORTACATH PLACEMENT Right 10/28/2018   Procedure: INSERTION PORT-A-CATH;  Surgeon: Jules Husbands, MD;  Location: [facility];  Service: General;  Laterality: Right;   XI ROBOTIC ASSISTED VENTRAL HERNIA N/A 02/27/2021   Procedure: XI ROBOTIC ASSISTED VENTRAL HERNIA (incisional) WITH MESH;  Surgeon: Olean Ree, MD;  Location: [facility];  Service: General;  Laterality: N/A;    SOCIAL HISTORY: Social History   Socioeconomic History   Marital status: Married    Spouse name: [name]    Number of children: 3   Years of education: Not on file   Highest education level: Not on file  Occupational History   Occupation: Retired    Comment: Chief Technology Officer   Tobacco Use   Smoking status: Former    Packs/day: 1.00    Years: 39.00    Pack years: 39.00    Types: Cigarettes    Start date: 12/21/1978    Quit date: 10/16/2018    Years since quitting: 3.1   Smokeless tobacco: Never  Vaping Use   Vaping Use: Never used  Substance and Sexual Activity   Alcohol use: Yes    Alcohol/week: 0.0 standard drinks    Comment: occassional - approx 1 every 2 weeks  Drug use: No   Sexual activity: Yes    Birth control/protection: None  Other Topics Concern   Not on file  Social History Narrative   Not on file   Social Determinants of Health   Financial Resource Strain: Not on file  Food Insecurity: Not on file  Transportation Needs: Not on file  Physical Activity: Not on file  Stress: Not on file  Social Connections: Not on file  Intimate Partner Violence: Not on file    FAMILY HISTORY: Family History  Problem Relation Age of Onset   Alzheimer's disease Mother    Colon cancer Father    Breast cancer Neg Hx     ALLERGIES:  has No Known Allergies.  MEDICATIONS:  Current Outpatient Medications   Medication Sig Dispense Refill   acetaminophen (TYLENOL) 500 MG tablet Take 2 tablets (1,000 mg total) by mouth every 6 (six) hours as needed for mild pain or headache.     aspirin EC 325 MG EC tablet Take 1 tablet (325 mg total) by mouth daily. Swallow whole.     atorvastatin (LIPITOR) 40 MG tablet Take 1 tablet (40 mg total) by mouth daily. 30 tablet 3   Calcium 600-200 MG-UNIT tablet Take 1 tablet by mouth 2 (two) times daily.     Cholecalciferol (DIALYVITE VITAMIN D 5000) 125 MCG (5000 UT) capsule Take 5,000 Units by mouth daily.     dexamethasone (DECADRON) 2 MG tablet Take 2 tablets (4 mg total) by mouth 2 (two) times daily. 4 mg BID x 3 days, 4 mg daily x3 days, 2 mg daily x days. 21 tablet 0   lidocaine-prilocaine (EMLA) cream Apply 1 application topically daily as needed (port access). 30 g 2   Magnesium 250 MG TABS Take 250 mg by mouth daily.     ondansetron (ZOFRAN) 8 MG tablet Take 1 tablet (8 mg total) by mouth every 8 (eight) hours as needed for nausea or vomiting. 60 tablet 1   oxyCODONE (ROXICODONE) 5 MG immediate release tablet Take 1 tablet (5 mg total) by mouth every 12 (twelve) hours as needed for severe pain. 30 tablet 0   pantoprazole (PROTONIX) 40 MG tablet Take 1 tablet (40 mg total) by mouth 2 (two) times daily. 60 tablet 0   prochlorperazine (COMPAZINE) 10 MG tablet Take 10 mg by mouth every 6 (six) hours as needed for nausea or vomiting.     sertraline (ZOLOFT) 100 MG tablet Take 1 tablet (100 mg total) by mouth daily. 90 tablet 3   sucralfate (CARAFATE) 1 g tablet Take 1 g by mouth 3 (three) times daily as needed (heartburn).     traZODone (DESYREL) 50 MG tablet Take 1 tablet (50 mg total) by mouth at bedtime as needed for sleep. 30 tablet 2   vitamin B-12 (CYANOCOBALAMIN) 1000 MCG tablet Take 1,000 mcg by mouth daily.     No current facility-administered medications for this visit.   Facility-Administered Medications Ordered in Other Visits  Medication Dose Route  Frequency Provider Last Rate Last Admin   heparin lock flush 100 unit/mL  500 Units Intravenous Once Earlie Server, MD       heparin lock flush 100 unit/mL  500 Units Intravenous Once Earlie Server, MD       sodium chloride flush (NS) 0.9 % injection 10 mL  10 mL Intravenous PRN Earlie Server, MD   10 mL at 01/09/19 0820   sodium chloride flush (NS) 0.9 % injection 10 mL  10 mL Intravenous PRN  Earlie Server, MD   10 mL at 01/10/19 1400   sodium chloride flush (NS) 0.9 % injection 10 mL  10 mL Intracatheter PRN Earlie Server, MD   10 mL at 12/24/21 0933     PHYSICAL EXAMINATION: ECOG PERFORMANCE STATUS: 1 - Symptomatic but completely ambulatory Vitals:   12/24/21 0848  BP: 116/81  Pulse: 90  Temp: 97.7 F (36.5 C)   Filed Weights   12/24/21 0848  Weight: 150 lb 1.6 oz (68.1 kg)   Physical Examination Today's Vitals   12/24/21 0848  BP: 116/81  Pulse: 90  Temp: 97.7 F (36.5 C)  TempSrc: Tympanic  Weight: 150 lb 1.6 oz (68.1 kg)  PainSc: 0-No pain   Body mass index is 23.51 kg/m.   Physical Exam Constitutional:      General: She is not in acute distress.    Appearance: She is not diaphoretic.     Comments: Patient walks independently  HENT:     Head: Normocephalic and atraumatic.     Nose: Nose normal.     Mouth/Throat:     Pharynx: No oropharyngeal exudate.  Eyes:     General: No scleral icterus.    Pupils: Pupils are equal, round, and reactive to light.  Cardiovascular:     Rate and Rhythm: Normal rate and regular rhythm.     Heart sounds: No murmur heard. Pulmonary:     Effort: Pulmonary effort is normal. No respiratory distress.     Breath sounds: No wheezing or rales.     Comments: Decreased breath sounds bilaterally.  Abdominal:     General: There is no distension.     Palpations: Abdomen is soft.     Tenderness: There is no abdominal tenderness.  Musculoskeletal:        General: Normal range of motion.     Cervical back: Normal range of motion and neck supple.  Skin:     General: Skin is warm and dry.     Findings: No erythema.  Neurological:     Mental Status: She is alert and oriented to person, place, and time.     Cranial Nerves: No cranial nerve deficit.     Motor: No abnormal muscle tone.     Coordination: Coordination normal.  Psychiatric:        Mood and Affect: Affect normal.       LABORATORY DATA:  I have reviewed the data as listed Lab Results  Component Value Date   WBC 5.8 12/24/2021   HGB 12.0 12/24/2021   HCT 36.2 12/24/2021   MCV 90.5 12/24/2021   PLT 180 12/24/2021   Recent Labs    10/20/21 0845 12/01/21 0809 12/24/21 0825  NA 134* 135 134*  K 3.9 3.6 3.8  CL 99 102 104  CO2 28 24 23   GLUCOSE 98 108* 97  BUN 17 20 21   CREATININE 0.77 0.93 0.87  CALCIUM 9.5 9.3 9.0  GFRNONAA >60 >60 >60  PROT 7.4 7.1 7.4  ALBUMIN 4.2 3.9 3.9  AST 19 24 32  ALT 16 18 25   ALKPHOS 49 45 63  BILITOT 0.6 0.4 0.7     RADIOGRAPHIC STUDIES: I have personally reviewed the radiological images as listed and agreed with the findings in the report.  MR Brain W Wo Contrast  Result Date: 11/22/2021 CLINICAL DATA:  Follow-up metastatic disease. Whole-brain radiotherapy with new cerebellar lesions under consideration for focus treatment. EXAM: MRI HEAD WITHOUT AND WITH CONTRAST TECHNIQUE: Multiplanar, multiecho pulse sequences of  the brain and surrounding structures were obtained without and with intravenous contrast. CONTRAST:  7.12mL GADAVIST GADOBUTROL 1 MMOL/ML IV SOLN COMPARISON:  10/28/2021 FINDINGS: Brain: The areas of cerebellar enhancement have resolved and there is now cleft like T2 hyperintensity in these areas compatible with prior infarcts. There is a background of extensive chronic small vessel ischemia specially in the supratentorial white matter in the setting of prior whole-brain radiotherapy. Pontine and deep gray nuclei ischemic changes well. No hydrocephalus, collection, or swelling. Vascular: Normal flow voids and vascular  enhancement. Skull and upper cervical spine: Normal marrow signal Sinuses/Orbits: Negative IMPRESSION: 1. Resolved sites of cerebellar enhancement best attributed to maturing infarcts. No enhancing metastatic disease seen today. 2. Chronic small vessel ischemia. Electronically Signed   By: Jorje Guild M.D.   On: 11/22/2021 08:53   MR Brain W Wo Contrast  Result Date: 10/29/2021 CLINICAL DATA:  Small cell lung cancer, status post radiation EXAM: MRI HEAD WITHOUT AND WITH CONTRAST TECHNIQUE: Multiplanar, multiecho pulse sequences of the brain and surrounding structures were obtained without and with intravenous contrast. CONTRAST:  85mL GADAVIST GADOBUTROL 1 MMOL/ML IV SOLN COMPARISON:  05/20/2021 normal. FINDINGS: Brain: Multiple foci of restricted diffusion in the cerebellum (series 3, images 56, 57, and 62). A focus in the left cerebellum correlates with an enhancing lesion that measures up to 9 mm (series 1016, image 42, which is new from the prior exam and is associated with increased T2 signal. Additional lesion in the right cerebellum measures up to 6 mm (series 1016, image 139), also new from the prior exam and associated with increased T2 signal. An additional lesion more superiorly in the right cerebellum correlates with a smaller focus of restricted diffusion seen on the prior exam; evaluation for contrast enhancement in this lesion is somewhat limited given its location in areas affected by pulsation artifact on the coronal and sagittal T1 sequences (series 14, image 13 and series 15, image 13), with only minimal enhancement on the axial SPGR (series 1016, image 122). This lesion are associated with T2 hyperintense signal (series 10, image 16) and likely measures up to 9 mm (series 15, image 14). No other restricted diffusion. No dural thickening. No mass effect or midline shift. No hydrocephalus or extra-axial collection. Redemonstrated T2 hyperintense signal in the periventricular white matter,  likely related to prior radiation. Vascular: Normal flow voids. Skull and upper cervical spine: Normal marrow signal. Sinuses/Orbits: Negative. Other: Trace fluid in left mastoid air cells. IMPRESSION: 1. Two definite new enhancing lesions in cerebellum, with associated restricted diffusion and increased T2 signal, concerning for new foci of metastatic disease. 2. A third lesion in the right cerebellar hemisphere was likely present on the prior exam but continues to show limited contrast enhancement on SPGR; this lesion is associated with diffusion restriction and increased T2 signal, but, on coronal and sagittal T1 sequences, it correlates with an area affected by pulsation artifact. This is also felt to represent a new focus of metastatic disease. Electronically Signed   By: Merilyn Baba M.D.   On: 10/29/2021 14:32   CT CHEST ABDOMEN PELVIS W CONTRAST  Result Date: 10/20/2021 CLINICAL DATA:  76 year old female with history of lung cancer. Chemotherapy ongoing. Follow-up study. EXAM: CT CHEST, ABDOMEN, AND PELVIS WITH CONTRAST TECHNIQUE: Multidetector CT imaging of the chest, abdomen and pelvis was performed following the standard protocol during bolus administration of intravenous contrast. CONTRAST:  114mL OMNIPAQUE IOHEXOL 300 MG/ML  SOLN COMPARISON:  CT the chest, abdomen and pelvis 06/16/2021.  FINDINGS: CT CHEST FINDINGS Cardiovascular: Heart size is normal. There is no significant pericardial fluid, thickening or pericardial calcification. There is aortic atherosclerosis, as well as atherosclerosis of the great vessels of the mediastinum and the coronary arteries, including calcified atherosclerotic plaque in the left main, left anterior descending, left circumflex and right coronary arteries. Thickening and calcification of the aortic valve. Right internal jugular single-lumen porta cath with tip terminating at the superior cavoatrial junction. Mediastinum/Nodes: No pathologically enlarged mediastinal  or hilar lymph nodes. Esophagus is unremarkable in appearance. No axillary lymphadenopathy. Lungs/Pleura: Small calcified granulomas are noted in the right lung. No other suspicious appearing pulmonary nodules or masses are noted. No acute consolidative airspace disease. No pleural effusions. Mild diffuse bronchial wall thickening with mild centrilobular and paraseptal emphysema. Mild linear scarring in the lung bases bilaterally, similar to the prior study. Musculoskeletal: Chronic compression fractures of T4 and T7 are again noted, most severe at T7 where there is 90% loss of anterior vertebral body height and post vertebroplasty changes. Old healed fracture of the superior aspect of the sternum with mild posttraumatic deformity, similar to the prior study. CT ABDOMEN PELVIS FINDINGS Hepatobiliary: No suspicious cystic or solid hepatic lesions. No intra or extrahepatic biliary ductal dilatation. Status post cholecystectomy. Pancreas: No pancreatic mass. No pancreatic ductal dilatation. No pancreatic or peripancreatic fluid collections or inflammatory changes. Spleen: Unremarkable. Adrenals/Urinary Tract: Subcentimeter low-attenuation lesion in the right kidney, too small to characterize, but statistically likely to represent a small cyst. Left kidney and bilateral adrenal glands are normal in appearance. No hydroureteronephrosis. Base of urinary bladder extends well below the level of the pubococcygeal line at rest, indicative of a severe cystocele. Urinary bladder is otherwise unremarkable in appearance. Stomach/Bowel: Normal appearance of the stomach. No pathologic dilatation of small bowel or colon. The appendix is not confidently identified and may be surgically absent. Regardless, there are no inflammatory changes noted adjacent to the cecum to suggest the presence of an acute appendicitis at this time. Vascular/Lymphatic: Aortic atherosclerosis, without evidence of aneurysm or dissection in the abdominal or  pelvic vasculature. No lymphadenopathy noted in the abdomen or pelvis. Reproductive: Uterus and ovaries are atrophic. Other: No significant volume of ascites.  No pneumoperitoneum. Musculoskeletal: There are no aggressive appearing lytic or blastic lesions noted in the visualized portions of the skeleton. IMPRESSION: 1. No definite findings to suggest local recurrence of disease or definite metastatic disease in the chest, abdomen or pelvis. 2. Mild diffuse bronchial wall thickening with mild centrilobular and paraseptal emphysema; imaging findings suggestive of underlying COPD. 3. Aortic atherosclerosis, in addition to left main and 3 vessel coronary artery disease. Assessment for potential risk factor modification, dietary therapy or pharmacologic therapy may be warranted, if clinically indicated. 4. There are calcifications of the aortic valve. Echocardiographic correlation for evaluation of potential valvular dysfunction may be warranted if clinically indicated. 5. Cystocele. 6. Additional incidental findings, as above. Electronically Signed   By: Vinnie Langton M.D.   On: 10/20/2021 11:30   NM PET Image Restag (PS) Skull Base To Thigh  Result Date: 11/21/2021 CLINICAL DATA:  Subsequent treatment strategy for lung cancer with ongoing therapy found to have metastatic disease to the brain. EXAM: NUCLEAR MEDICINE PET SKULL BASE TO THIGH TECHNIQUE: 7.7 mCi F-18 FDG was injected intravenously. Full-ring PET imaging was performed from the skull base to thigh after the radiotracer. CT data was obtained and used for attenuation correction and anatomic localization. Fasting blood glucose: 73 mg/dl COMPARISON:  Multiple prior studies  most recent comparison imaging of the chest abdomen and pelvis study from October 17, 2021. FINDINGS: Mediastinal blood pool activity: SUV max 2.85 Liver activity: SUV max NA NECK: No hypermetabolic lymph nodes in the neck. Incidental CT findings: CNS metastases not well seen, refer to  prior MRI for additional detail. CHEST: No hypermetabolic mediastinal or hilar nodes. No suspicious pulmonary nodules on the CT scan. Incidental CT findings: RIGHT-sided Port-A-Cath terminates at the caval to atrial junction. Heart size is normal. Aortic atherosclerosis without aneurysm. Normal caliber of the central pulmonary vasculature. Limited assessment of cardiovascular structures given lack of intravenous contrast. Lungs are clear. Mild pulmonary emphysema. Scarring at the LEFT lung base related to prior post treatment changes. Airways are patent. ABDOMEN/PELVIS: No abnormal hypermetabolic activity within the liver, pancreas, adrenal glands, or spleen. No hypermetabolic lymph nodes in the abdomen or pelvis. Incidental CT findings: Post cholecystectomy. No acute findings relative to liver, spleen, pancreas, adrenal glands, kidneys, urinary bladder, stomach, small or large bowel. Findings of small hiatal hernia. Evidence of previous ventral midline abdominal wall reconstruction/hernia repair. Signs of colonic diverticulosis. Aortic atheromatous plaque with heavy calcification of the abdominal aorta without aneurysmal dilation. Signs of cystocele with descent of the bladder base below the symphysis pubis. The reproductive structures grossly unremarkable. SKELETON: No focal hypermetabolic activity to suggest skeletal metastasis. Incidental CT findings: Degenerative changes throughout the spine. Signs of osteopenia. Cement augmentation related to compression fracture in the midthoracic spine as seen on previous imaging IMPRESSION: No FDG avid disease in the neck, chest, abdomen or pelvis. Signs of pulmonary emphysema and evidence of prior therapy involving the LEFT lower lobe. Aortic atherosclerosis and coronary artery calcification. Thoracic spine compression fractures 1 post cement augmentation as on recent CT imaging. 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 Electronically Signed   By:  Zetta Bills M.D.   On: 11/21/2021 14:59      ASSESSMENT & PLAN:  1. Small cell lung cancer ([place])   2. Encounter for antineoplastic immunotherapy   3. Cerebrovascular accident (CVA), unspecified mechanism ([place])   4. Osteopenia, unspecified location    #Extensive small cell lung cancer, S/p 4 cycles of Carboplatin, Etoposide and Tecentriq.   Currently on Tecentriq maintenance. Labs are reviewed and discussed with patient. Proceed with Tecentriq today.  # CVA, follow up with neurology. Continue Asprin and Statin.  #Chronic back pain, stable symptoms.  Oxycodone as needed.  #Chronic intermittent headache, stable symptoms.   #Fatigue, normal thyroid function. # History bone metastasis and Osteopenia  zometa every 4-6 weeks.  Zometa next viist.  Continue calcium supplementation.[name] Kitchen   #GERD/burping, continue PPI. # COPD/emphysema, Stable. Monitor.   Follow up in 3 weeks for next cycle of Tecentriq   Earlie Server, MD, PhD

## 2021-12-24 NOTE — Progress Notes (Signed)
Patient here for follow up. Patient complains of fatigue.

## 2021-12-26 ENCOUNTER — Ambulatory Visit
Admission: RE | Admit: 2021-12-26 | Discharge: 2021-12-26 | Disposition: A | Discharge status: Home / Self Care | Payer: Medicare Other | Source: Ambulatory Visit | Attending: Internal Medicine | Admitting: Internal Medicine

## 2021-12-26 ENCOUNTER — Other Ambulatory Visit: Payer: Self-pay

## 2021-12-26 DIAGNOSIS — I517 Cardiomegaly: Secondary | ICD-10-CM | POA: Insufficient documentation

## 2021-12-26 DIAGNOSIS — C3490 Malignant neoplasm of unspecified part of unspecified bronchus or lung: Secondary | ICD-10-CM | POA: Diagnosis not present

## 2021-12-26 DIAGNOSIS — I2510 Atherosclerotic heart disease of native coronary artery without angina pectoris: Secondary | ICD-10-CM | POA: Diagnosis not present

## 2021-12-26 DIAGNOSIS — I252 Old myocardial infarction: Secondary | ICD-10-CM | POA: Diagnosis not present

## 2021-12-26 DIAGNOSIS — Z79899 Other long term (current) drug therapy: Secondary | ICD-10-CM | POA: Diagnosis not present

## 2021-12-26 DIAGNOSIS — Z0189 Encounter for other specified special examinations: Secondary | ICD-10-CM

## 2021-12-26 DIAGNOSIS — Z5181 Encounter for therapeutic drug level monitoring: Secondary | ICD-10-CM | POA: Diagnosis not present

## 2021-12-26 LAB — ECHOCARDIOGRAM COMPLETE
AR max vel: 2.27 cm2
AV Area VTI: 2.71 cm2
AV Area mean vel: 2.19 cm2
AV Mean grad: 5 mmHg
AV Peak grad: 8.8 mmHg
Ao pk vel: 1.48 m/s
Area-P 1/2: 4.96 cm2
MV VTI: 3.6 cm2
S' Lateral: 1.8 cm

## 2021-12-26 NOTE — Progress Notes (Signed)
*  PRELIMINARY RESULTS* Echocardiogram 2D Echocardiogram has been performed.  Sherrie Sport 12/26/2021, 11:47 AM

## 2021-12-30 ENCOUNTER — Telehealth: Payer: Self-pay | Admitting: Internal Medicine

## 2021-12-30 NOTE — Telephone Encounter (Signed)
Copied from [place] [phone]. Topic: Medicare AWV >> Dec 30, 2021 10:33 AM Cher Nakai R wrote: Reason for CRM:  Left message for patient to call back and schedule Medicare Annual Wellness Visit (AWV) in office.   If unable to come into the office for AWV,  please offer to do virtually or by telephone.  Last AWV: 09/19/2018   Please schedule at anytime with [facility]visor.      40 Minutes appointment   Any questions, please call me at [phone]

## 2022-01-06 ENCOUNTER — Telehealth: Payer: Self-pay

## 2022-01-06 NOTE — Telephone Encounter (Signed)
We will review what they are sending once we receive it and then let patient know if we can complete. Waiting for forms to arrive.

## 2022-01-06 NOTE — Telephone Encounter (Signed)
DOUGLAS THE HUSBAND CAME IN THE OFFICE HE SAID [employer] IS SENDING SOME INFORMATION THROUGH THE MAIL TO BE FILLED OUT AND FAXED OVER AND WOULD LIKE TO BE CONTACTED ONCE COMPLETED.

## 2022-01-12 ENCOUNTER — Telehealth: Payer: Self-pay

## 2022-01-12 NOTE — Telephone Encounter (Signed)
Received paperwork from [place] for patient. Called patient to discuss this paperwork. No answer. Waiting for patient to call back to discuss.

## 2022-01-14 ENCOUNTER — Encounter: Payer: Self-pay | Admitting: Oncology

## 2022-01-14 ENCOUNTER — Inpatient Hospital Stay: Payer: Medicare Other

## 2022-01-14 ENCOUNTER — Inpatient Hospital Stay (HOSPITAL_BASED_OUTPATIENT_CLINIC_OR_DEPARTMENT_OTHER): Payer: Medicare Other | Admitting: Oncology

## 2022-01-14 ENCOUNTER — Other Ambulatory Visit: Payer: Self-pay

## 2022-01-14 VITALS — BP 103/72 | HR 102 | Temp 98.2°F | Wt 149.0 lb

## 2022-01-14 DIAGNOSIS — R42 Dizziness and giddiness: Secondary | ICD-10-CM | POA: Diagnosis not present

## 2022-01-14 DIAGNOSIS — R5383 Other fatigue: Secondary | ICD-10-CM

## 2022-01-14 DIAGNOSIS — I639 Cerebral infarction, unspecified: Secondary | ICD-10-CM

## 2022-01-14 DIAGNOSIS — Z87891 Personal history of nicotine dependence: Secondary | ICD-10-CM | POA: Diagnosis not present

## 2022-01-14 DIAGNOSIS — C3490 Malignant neoplasm of unspecified part of unspecified bronchus or lung: Secondary | ICD-10-CM

## 2022-01-14 DIAGNOSIS — Z5112 Encounter for antineoplastic immunotherapy: Secondary | ICD-10-CM | POA: Diagnosis not present

## 2022-01-14 LAB — CBC WITH DIFFERENTIAL/PLATELET
Abs Immature Granulocytes: 0.03 10*3/uL (ref 0.00–0.07)
Basophils Absolute: 0 10*3/uL (ref 0.0–0.1)
Basophils Relative: 1 %
Eosinophils Absolute: 0.4 10*3/uL (ref 0.0–0.5)
Eosinophils Relative: 4 %
HCT: 36.1 % (ref 36.0–46.0)
Hemoglobin: 11.9 g/dL — ABNORMAL LOW (ref 12.0–15.0)
Immature Granulocytes: 0 %
Lymphocytes Relative: 16 %
Lymphs Abs: 1.3 10*3/uL (ref 0.7–4.0)
MCH: 29.7 pg (ref 26.0–34.0)
MCHC: 33 g/dL (ref 30.0–36.0)
MCV: 90 fL (ref 80.0–100.0)
Monocytes Absolute: 1.1 10*3/uL — ABNORMAL HIGH (ref 0.1–1.0)
Monocytes Relative: 13 %
Neutro Abs: 5.6 10*3/uL (ref 1.7–7.7)
Neutrophils Relative %: 66 %
Platelets: 183 10*3/uL (ref 150–400)
RBC: 4.01 MIL/uL (ref 3.87–5.11)
RDW: 13 % (ref 11.5–15.5)
WBC: 8.5 10*3/uL (ref 4.0–10.5)
nRBC: 0 % (ref 0.0–0.2)

## 2022-01-14 LAB — COMPREHENSIVE METABOLIC PANEL
ALT: 29 U/L (ref 0–44)
AST: 39 U/L (ref 15–41)
Albumin: 3.9 g/dL (ref 3.5–5.0)
Alkaline Phosphatase: 63 U/L (ref 38–126)
Anion gap: 10 (ref 5–15)
BUN: 15 mg/dL (ref 8–23)
CO2: 25 mmol/L (ref 22–32)
Calcium: 9.4 mg/dL (ref 8.9–10.3)
Chloride: 102 mmol/L (ref 98–111)
Creatinine, Ser: 0.9 mg/dL (ref 0.44–1.00)
GFR, Estimated: >60 mL/min (ref >60)
Glucose, Bld: 113 mg/dL — ABNORMAL HIGH (ref 70–99)
Potassium: 3.7 mmol/L (ref 3.5–5.1)
Sodium: 137 mmol/L (ref 135–145)
Total Bilirubin: 0.5 mg/dL (ref 0.3–1.2)
Total Protein: 7.1 g/dL (ref 6.5–8.1)

## 2022-01-14 LAB — CORTISOL: Cortisol, Plasma: 19.1 ug/dL

## 2022-01-14 MED ORDER — SODIUM CHLORIDE 0.9% FLUSH
10.0000 mL | Freq: Once | INTRAVENOUS | Status: AC
  Administered 2022-01-14: 09:00:00 10 mL via INTRAVENOUS
  Filled 2022-01-14: qty 10

## 2022-01-14 MED ORDER — HEPARIN SOD (PORK) LOCK FLUSH 100 UNIT/ML IV SOLN
500.0000 [IU] | Freq: Once | INTRAVENOUS | Status: AC
  Administered 2022-01-14: 11:00:00 500 [IU] via INTRAVENOUS
  Filled 2022-01-14: qty 5

## 2022-01-14 MED ORDER — SODIUM CHLORIDE 0.9 % IV SOLN
Freq: Once | INTRAVENOUS | Status: AC
  Filled 2022-01-14: qty 250

## 2022-01-14 NOTE — Progress Notes (Signed)
Hematology/Oncology Follow up note [phone] [phone]   Patient Care Team: Glean Hess, MD as PCP - General (Internal Medicine) Charolette Forward, MD as Consulting Physician (Cardiology) Telford Nab, RN as Registered Nurse [name], MD as Radiation Oncologist (Radiation Oncology)   REASON FOR VISIT:  Follow-up for small cell lung cancer,   HISTORY OF PRESENTING ILLNESS:  Kristi [name] is a  76 y.o.  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  10/20/2018 CT chest with contrast showed large mediastinal mass involving both hilar, left greater than right, consistent with lung carcinoma, The mass causes narrowing of the left mainstem bronchus with resultant volume loss on the left and a mediastinal shift to the left.  Moderate size left pleural effusion. 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  # Initial MRI brain negative.  # Nov 2019- Jan 2020 s/p 4 cycles of Carbo/Etoposide/Tecentriq #  01/24/2019 interim CT scan done which showed continued positive response to therapy with continued reduction in mediastinal adenopathy.  No residual measurable left lung mass. CT findings of acute emphysematous cystitis. Urology did not feel that patient has pyelonephritis and recommend treatment with antibiotics because patient's immunocompromise. Patient finished treatment.   # 11/02/2018-01/09/2019 Chemotherapy carboplatin + Etoposide + tecentriq # Consolidation chest radiation and whole brain radiation finished in May 2020 # 04/25/2019 resume Tecentriq every 3 weeks. # Patient had a fall from her stairs on 01/19/2020. In the emergency room she had CT chest abdomen pelvis CT, CT head without contrast, CT cervical spine without contrast. No CT evidence for acute intracranial abnormality, degenerative changes of cervical spine..  No CT  evidence of acute thoracic abdomen injury.  Severe compression fracture of T7, subacute.  # kyphoplasty procedure on 03/01/2020.  T7 biopsy negative for cancer.  11/20/2020 Surveillance CT scan  Reduced size and prominence of right lateral lung base subpleural nodular density. No findings of active malignancy.  MRI brain showed stable post treatment brain. No metastatic disease.   # 10/29/2021 MRI brain w and wo contrast showed two definite new enhancing lesions in cerebellum, with associated restricted diffusion and increased T2 signal, concerning for new foci of metastatic disease. 2. A third lesion in the right cerebellar hemisphere was likely present on the prior exam but continues to show limited contrast enhancement on SPGR; this lesion is associated with diffusion restriction and increased T2 signal, but, on coronal and sagittal T1 sequences, it correlates with an area affected by pulsation artifact. This is also felt to represent a new focus of metastatic disease.  11/20/2021 PET scan - no FDG avid disease in neck chest abdomen or pelvis.  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 No enhancing metastatic disease seen today, chronic small vessel ischemia.   12/19/2021 was seen by neurology Dr.Vaslow. increase ASA to 325mg  daily. Atorvastatin.  40 mg  INTERVAL HISTORY Kristi [name] is a 76 y.o. female who has above history reviewed by me today presents for evaluation prior to immunotherapy for treatment of extensive small cell lung cancer. Patient reports feeling weak today.  Some lightheadedness.  No fever, chills, nausea vomiting diarrhea.  Husband reports that patient has runny nose.  No cough, sore throat     Review of Systems  Constitutional:  Positive for fatigue. Negative for appetite change, chills  and fever.  HENT:   Negative for hearing loss and voice change.    Eyes:  Negative for eye problems.  Respiratory:  Negative for chest tightness, cough and shortness of breath.   Cardiovascular:  Negative for chest pain.  Gastrointestinal:  Negative for abdominal distention, abdominal pain, blood in stool, diarrhea and nausea.  Endocrine: Negative for hot flashes.  Genitourinary:  Negative for difficulty urinating and frequency.   Musculoskeletal:  Negative for arthralgias and back pain.  Skin:  Negative for itching and rash.  Neurological:  Positive for light-headedness. Negative for extremity weakness and headaches.  Hematological:  Negative for adenopathy.  Psychiatric/Behavioral:  Negative for confusion and depression. The patient is not nervous/anxious.        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[place]) 09/30/2014   1 stent   Claustrophobia    Coronary artery disease    GERD (gastroesophageal reflux disease)    Hypercholesteremia    MI, old    Pneumonia    Small cell lung cancer ([place])    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 ([place]) 10/27/2018    SURGICAL HISTORY: Past Surgical History:  Procedure Laterality Date   APPENDECTOMY     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   BLADDER NECK SUSPENSION     CHOLECYSTECTOMY N/A 08/14/2018   Procedure: LAPAROSCOPIC CHOLECYSTECTOMY;  Surgeon: Jules Husbands, MD;  Location: [facility] ORS;  Service: General;  Laterality: N/A;   CHOLECYSTECTOMY  11/2018   CORONARY ANGIOPLASTY  09/29/2014   CORONARY STENT PLACEMENT     ENDOBRONCHIAL ULTRASOUND N/A 10/21/2018   Procedure: ENDOBRONCHIAL ULTRASOUND;  Surgeon: Laverle Hobby, MD;  Location: [facility] OR[facility];  Service: Pulmonary;  Laterality: N/A;   HERNIA REPAIR  2012   IR FLUORO GUIDE CV LINE RIGHT  12/19/2018   KYPHOPLASTY N/A 03/01/2020   Procedure: T7 KYPHOPLASTY;  Surgeon: Hessie Knows, MD;  Location: [facility];  Service: Orthopedics;  Laterality: N/A;   LAPAROTOMY     LEFT HEART CATHETERIZATION WITH  CORONARY ANGIOGRAM N/A 09/29/2014   Procedure: LEFT HEART CATHETERIZATION WITH CORONARY ANGIOGRAM;  Surgeon: Clent Demark, MD;  Location: [place] [facility];  Service: Cardiovascular;  Laterality: N/A;   PORTA CATH INSERTION N/A 01/02/2019   Procedure: PORTA CATH INSERTION;  Surgeon: Algernon Huxley, MD;  Location: [place] [facility];  Service: Cardiovascular;  Laterality: N/A;   PORTACATH PLACEMENT Right 10/28/2018   Procedure: INSERTION PORT-A-CATH;  Surgeon: Jules Husbands, MD;  Location: [facility];  Service: General;  Laterality: Right;   XI ROBOTIC ASSISTED VENTRAL HERNIA N/A 02/27/2021   Procedure: XI ROBOTIC ASSISTED VENTRAL HERNIA (incisional) WITH MESH;  Surgeon: Olean Ree, MD;  Location: [facility];  Service: General;  Laterality: N/A;    SOCIAL HISTORY: Social History   Socioeconomic History   Marital status: Married    Spouse name: [name]    Number of children: 3   Years of education: Not on file   Highest education level: Not on file  Occupational History   Occupation: Retired    Comment: Chief Technology Officer   Tobacco Use   Smoking status: Former    Packs/day: 1.00    Years: 39.00    Pack years: 39.00    Types: Cigarettes    Start date: 12/21/1978    Quit date: 10/16/2018    Years since quitting: 3.2   Smokeless tobacco: Never  Vaping Use   Vaping Use: Never used  Substance and Sexual Activity   Alcohol use: Yes    Alcohol/week: 0.0 standard drinks    Comment: occassional - approx 1 every 2 weeks    Drug use: No   Sexual activity: Yes    Birth control/protection: None  Other Topics Concern   Not on file  Social History Narrative   Not on file   Social Determinants of Health   Financial Resource Strain: Not on file  Food Insecurity: Not on file  Transportation Needs: Not on file  Physical Activity: Not on file  Stress: Not on file  Social Connections: Not on file  Intimate Partner Violence: Not on file    FAMILY HISTORY: Family History  Problem Relation Age of  Onset   Alzheimer's disease Mother    Colon cancer Father    Breast cancer Neg Hx     ALLERGIES:  has No Known Allergies.  MEDICATIONS:  Current Outpatient Medications  Medication Sig Dispense Refill   acetaminophen (TYLENOL) 500 MG tablet Take 2 tablets (1,000 mg total) by mouth every 6 (six) hours as needed for mild pain or headache.     aspirin EC 325 MG EC tablet Take 1 tablet (325 mg total) by mouth daily. Swallow whole.     atorvastatin (LIPITOR) 40 MG tablet Take 1 tablet (40 mg total) by mouth daily. 30 tablet 3   Calcium 600-200 MG-UNIT tablet Take 1 tablet by mouth 2 (two) times daily.     Cholecalciferol (DIALYVITE VITAMIN D 5000) 125 MCG (5000 UT) capsule Take 5,000 Units by mouth daily.     dexamethasone (DECADRON) 2 MG tablet Take 2 tablets (4 mg total) by mouth 2 (two) times daily. 4 mg BID x 3 days, 4 mg daily x3 days, 2 mg daily x days. 21 tablet 0   lidocaine-prilocaine (EMLA) cream Apply 1 application topically daily as needed (port access). 30 g 2   Magnesium 250 MG TABS Take 250 mg by mouth daily.     ondansetron (ZOFRAN) 8 MG tablet Take 1 tablet (8 mg total) by mouth every 8 (eight) hours as needed for nausea or vomiting. 60 tablet 1   oxyCODONE (ROXICODONE) 5 MG immediate release tablet Take 1 tablet (5 mg total) by mouth every 12 (twelve) hours as needed for severe pain. 30 tablet 0   pantoprazole (PROTONIX) 40 MG tablet Take 1 tablet (40 mg total) by mouth 2 (two) times daily. 60 tablet 0   prochlorperazine (COMPAZINE) 10 MG tablet Take 10 mg by mouth every 6 (six) hours as needed for nausea or vomiting.     sertraline (ZOLOFT) 100 MG tablet Take 1 tablet (100 mg total) by mouth daily. 90 tablet 3   sucralfate (CARAFATE) 1 g tablet Take 1 g by mouth 3 (three) times daily as needed (heartburn).     traZODone (DESYREL) 50 MG tablet Take 1 tablet (50 mg total) by mouth at bedtime as needed for sleep. 30 tablet 2   vitamin B-12 (CYANOCOBALAMIN) 1000 MCG tablet Take  1,000 mcg by mouth daily.     No current facility-administered medications for this visit.   Facility-Administered Medications Ordered in Other Visits  Medication Dose Route Frequency Provider Last Rate Last Admin   heparin lock flush 100 unit/mL  500 Units Intravenous Once Earlie Server, MD       heparin lock flush 100 unit/mL  500 Units Intravenous Once Earlie Server, MD       sodium chloride flush (NS) 0.9 % injection 10 mL  10 mL Intravenous PRN Earlie Server, MD   10 mL at 01/09/19 0820   sodium chloride flush (NS) 0.9 % injection 10 mL  10 mL Intravenous PRN Earlie Server, MD   10 mL at 01/10/19 1400     PHYSICAL EXAMINATION: ECOG PERFORMANCE STATUS: 1 - Symptomatic but completely ambulatory Vitals:   01/14/22 0905  BP: 103/72  Pulse: (!) 102  Temp: 98.2 F (36.8 C)   Filed Weights   01/14/22 0905  Weight: 149 lb (67.6 kg)   Physical Examination Today's Vitals   01/14/22 0905  BP: 103/72  Pulse: (!) 102  Temp: 98.2 F (36.8 C)  TempSrc: Tympanic  Weight: 149 lb (67.6 kg)  PainSc: 0-No pain   Body mass index is 23.34 kg/m.   Physical Exam Constitutional:      General: She is not in acute distress.    Appearance: She is not diaphoretic.     Comments: Patient walks independently  HENT:     Head: Normocephalic and atraumatic.     Nose: Nose normal.     Mouth/Throat:     Pharynx: No oropharyngeal exudate.  Eyes:     General: No scleral icterus.    Pupils: Pupils are equal, round, and reactive to light.  Cardiovascular:     Rate and Rhythm: Normal rate and regular rhythm.     Heart sounds: No murmur heard. Pulmonary:     Effort: Pulmonary effort is normal. No respiratory distress.     Breath sounds: No wheezing or rales.     Comments: Decreased breath sounds bilaterally.  Abdominal:     General: There is no distension.     Palpations: Abdomen is soft.     Tenderness: There is no abdominal tenderness.  Musculoskeletal:        General: Normal range of motion.     Cervical  back: Normal range of motion and neck supple.  Skin:    General: Skin is warm and dry.     Findings: No erythema.  Neurological:     Mental Status: She is alert and oriented to person, place, and time.     Cranial Nerves: No cranial nerve deficit.     Motor: No abnormal muscle tone.     Coordination: Coordination normal.  Psychiatric:        Mood and Affect: Affect normal.       LABORATORY DATA:  I have reviewed the data as listed Lab Results  Component Value Date   WBC 8.5 01/14/2022   HGB 11.9 (L) 01/14/2022   HCT 36.1 01/14/2022   MCV 90.0 01/14/2022   PLT 183 01/14/2022   Recent Labs    12/01/21 0809 12/24/21 0825 01/14/22 0854  NA 135 134* 137  K 3.6 3.8 3.7  CL 102 104 102  CO2 24 23 25   GLUCOSE 108* 97 113*  BUN 20 21 15   CREATININE 0.93 0.87 0.90  CALCIUM 9.3 9.0 9.4  GFRNONAA >60 >60 >60  PROT 7.1 7.4 7.1  ALBUMIN 3.9 3.9 3.9  AST 24 32 39  ALT 18 25 29   ALKPHOS 45 63 63  BILITOT 0.4 0.7 0.5     RADIOGRAPHIC STUDIES: I have personally reviewed the radiological images as listed and agreed with the findings in the report.  MR Brain W Wo Contrast  Result Date: 11/22/2021 CLINICAL DATA:  Follow-up metastatic disease. Whole-brain radiotherapy with new cerebellar lesions under consideration for focus treatment. EXAM: MRI HEAD WITHOUT AND WITH CONTRAST TECHNIQUE: Multiplanar, multiecho  pulse sequences of the brain and surrounding structures were obtained without and with intravenous contrast. CONTRAST:  7.50mL GADAVIST GADOBUTROL 1 MMOL/ML IV SOLN COMPARISON:  10/28/2021 FINDINGS: Brain: The areas of cerebellar enhancement have resolved and there is now cleft like T2 hyperintensity in these areas compatible with prior infarcts. There is a background of extensive chronic small vessel ischemia specially in the supratentorial white matter in the setting of prior whole-brain radiotherapy. Pontine and deep gray nuclei ischemic changes well. No hydrocephalus,  collection, or swelling. Vascular: Normal flow voids and vascular enhancement. Skull and upper cervical spine: Normal marrow signal Sinuses/Orbits: Negative IMPRESSION: 1. Resolved sites of cerebellar enhancement best attributed to maturing infarcts. No enhancing metastatic disease seen today. 2. Chronic small vessel ischemia. Electronically Signed   By: Jorje Guild M.D.   On: 11/22/2021 08:53   MR Brain W Wo Contrast  Result Date: 10/29/2021 CLINICAL DATA:  Small cell lung cancer, status post radiation EXAM: MRI HEAD WITHOUT AND WITH CONTRAST TECHNIQUE: Multiplanar, multiecho pulse sequences of the brain and surrounding structures were obtained without and with intravenous contrast. CONTRAST:  5mL GADAVIST GADOBUTROL 1 MMOL/ML IV SOLN COMPARISON:  05/20/2021 normal. FINDINGS: Brain: Multiple foci of restricted diffusion in the cerebellum (series 3, images 56, 57, and 62). A focus in the left cerebellum correlates with an enhancing lesion that measures up to 9 mm (series 1016, image 42, which is new from the prior exam and is associated with increased T2 signal. Additional lesion in the right cerebellum measures up to 6 mm (series 1016, image 139), also new from the prior exam and associated with increased T2 signal. An additional lesion more superiorly in the right cerebellum correlates with a smaller focus of restricted diffusion seen on the prior exam; evaluation for contrast enhancement in this lesion is somewhat limited given its location in areas affected by pulsation artifact on the coronal and sagittal T1 sequences (series 14, image 13 and series 15, image 13), with only minimal enhancement on the axial SPGR (series 1016, image 122). This lesion are associated with T2 hyperintense signal (series 10, image 16) and likely measures up to 9 mm (series 15, image 14). No other restricted diffusion. No dural thickening. No mass effect or midline shift. No hydrocephalus or extra-axial collection.  Redemonstrated T2 hyperintense signal in the periventricular white matter, likely related to prior radiation. Vascular: Normal flow voids. Skull and upper cervical spine: Normal marrow signal. Sinuses/Orbits: Negative. Other: Trace fluid in left mastoid air cells. IMPRESSION: 1. Two definite new enhancing lesions in cerebellum, with associated restricted diffusion and increased T2 signal, concerning for new foci of metastatic disease. 2. A third lesion in the right cerebellar hemisphere was likely present on the prior exam but continues to show limited contrast enhancement on SPGR; this lesion is associated with diffusion restriction and increased T2 signal, but, on coronal and sagittal T1 sequences, it correlates with an area affected by pulsation artifact. This is also felt to represent a new focus of metastatic disease. Electronically Signed   By: Merilyn Baba M.D.   On: 10/29/2021 14:32   CT CHEST ABDOMEN PELVIS W CONTRAST  Result Date: 10/20/2021 CLINICAL DATA:  76 year old female with history of lung cancer. Chemotherapy ongoing. Follow-up study. EXAM: CT CHEST, ABDOMEN, AND PELVIS WITH CONTRAST TECHNIQUE: Multidetector CT imaging of the chest, abdomen and pelvis was performed following the standard protocol during bolus administration of intravenous contrast. CONTRAST:  129mL OMNIPAQUE IOHEXOL 300 MG/ML  SOLN COMPARISON:  CT the chest, abdomen  and pelvis 06/16/2021. FINDINGS: CT CHEST FINDINGS Cardiovascular: Heart size is normal. There is no significant pericardial fluid, thickening or pericardial calcification. There is aortic atherosclerosis, as well as atherosclerosis of the great vessels of the mediastinum and the coronary arteries, including calcified atherosclerotic plaque in the left main, left anterior descending, left circumflex and right coronary arteries. Thickening and calcification of the aortic valve. Right internal jugular single-lumen porta cath with tip terminating at the superior  cavoatrial junction. Mediastinum/Nodes: No pathologically enlarged mediastinal or hilar lymph nodes. Esophagus is unremarkable in appearance. No axillary lymphadenopathy. Lungs/Pleura: Small calcified granulomas are noted in the right lung. No other suspicious appearing pulmonary nodules or masses are noted. No acute consolidative airspace disease. No pleural effusions. Mild diffuse bronchial wall thickening with mild centrilobular and paraseptal emphysema. Mild linear scarring in the lung bases bilaterally, similar to the prior study. Musculoskeletal: Chronic compression fractures of T4 and T7 are again noted, most severe at T7 where there is 90% loss of anterior vertebral body height and post vertebroplasty changes. Old healed fracture of the superior aspect of the sternum with mild posttraumatic deformity, similar to the prior study. CT ABDOMEN PELVIS FINDINGS Hepatobiliary: No suspicious cystic or solid hepatic lesions. No intra or extrahepatic biliary ductal dilatation. Status post cholecystectomy. Pancreas: No pancreatic mass. No pancreatic ductal dilatation. No pancreatic or peripancreatic fluid collections or inflammatory changes. Spleen: Unremarkable. Adrenals/Urinary Tract: Subcentimeter low-attenuation lesion in the right kidney, too small to characterize, but statistically likely to represent a small cyst. Left kidney and bilateral adrenal glands are normal in appearance. No hydroureteronephrosis. Base of urinary bladder extends well below the level of the pubococcygeal line at rest, indicative of a severe cystocele. Urinary bladder is otherwise unremarkable in appearance. Stomach/Bowel: Normal appearance of the stomach. No pathologic dilatation of small bowel or colon. The appendix is not confidently identified and may be surgically absent. Regardless, there are no inflammatory changes noted adjacent to the cecum to suggest the presence of an acute appendicitis at this time. Vascular/Lymphatic: Aortic  atherosclerosis, without evidence of aneurysm or dissection in the abdominal or pelvic vasculature. No lymphadenopathy noted in the abdomen or pelvis. Reproductive: Uterus and ovaries are atrophic. Other: No significant volume of ascites.  No pneumoperitoneum. Musculoskeletal: There are no aggressive appearing lytic or blastic lesions noted in the visualized portions of the skeleton. IMPRESSION: 1. No definite findings to suggest local recurrence of disease or definite metastatic disease in the chest, abdomen or pelvis. 2. Mild diffuse bronchial wall thickening with mild centrilobular and paraseptal emphysema; imaging findings suggestive of underlying COPD. 3. Aortic atherosclerosis, in addition to left main and 3 vessel coronary artery disease. Assessment for potential risk factor modification, dietary therapy or pharmacologic therapy may be warranted, if clinically indicated. 4. There are calcifications of the aortic valve. Echocardiographic correlation for evaluation of potential valvular dysfunction may be warranted if clinically indicated. 5. Cystocele. 6. Additional incidental findings, as above. Electronically Signed   By: Vinnie Langton M.D.   On: 10/20/2021 11:30   NM PET Image Restag (PS) Skull Base To Thigh  Result Date: 11/21/2021 CLINICAL DATA:  Subsequent treatment strategy for lung cancer with ongoing therapy found to have metastatic disease to the brain. EXAM: NUCLEAR MEDICINE PET SKULL BASE TO THIGH TECHNIQUE: 7.7 mCi F-18 FDG was injected intravenously. Full-ring PET imaging was performed from the skull base to thigh after the radiotracer. CT data was obtained and used for attenuation correction and anatomic localization. Fasting blood glucose: 73 mg/dl COMPARISON:  Multiple prior studies most recent comparison imaging of the chest abdomen and pelvis study from October 17, 2021. FINDINGS: Mediastinal blood pool activity: SUV max 2.85 Liver activity: SUV max NA NECK: No hypermetabolic lymph  nodes in the neck. Incidental CT findings: CNS metastases not well seen, refer to prior MRI for additional detail. CHEST: No hypermetabolic mediastinal or hilar nodes. No suspicious pulmonary nodules on the CT scan. Incidental CT findings: RIGHT-sided Port-A-Cath terminates at the caval to atrial junction. Heart size is normal. Aortic atherosclerosis without aneurysm. Normal caliber of the central pulmonary vasculature. Limited assessment of cardiovascular structures given lack of intravenous contrast. Lungs are clear. Mild pulmonary emphysema. Scarring at the LEFT lung base related to prior post treatment changes. Airways are patent. ABDOMEN/PELVIS: No abnormal hypermetabolic activity within the liver, pancreas, adrenal glands, or spleen. No hypermetabolic lymph nodes in the abdomen or pelvis. Incidental CT findings: Post cholecystectomy. No acute findings relative to liver, spleen, pancreas, adrenal glands, kidneys, urinary bladder, stomach, small or large bowel. Findings of small hiatal hernia. Evidence of previous ventral midline abdominal wall reconstruction/hernia repair. Signs of colonic diverticulosis. Aortic atheromatous plaque with heavy calcification of the abdominal aorta without aneurysmal dilation. Signs of cystocele with descent of the bladder base below the symphysis pubis. The reproductive structures grossly unremarkable. SKELETON: No focal hypermetabolic activity to suggest skeletal metastasis. Incidental CT findings: Degenerative changes throughout the spine. Signs of osteopenia. Cement augmentation related to compression fracture in the midthoracic spine as seen on previous imaging IMPRESSION: No FDG avid disease in the neck, chest, abdomen or pelvis. Signs of pulmonary emphysema and evidence of prior therapy involving the LEFT lower lobe. Aortic atherosclerosis and coronary artery calcification. Thoracic spine compression fractures 1 post cement augmentation as on recent CT imaging. 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 Electronically Signed   By: Zetta Bills M.D.   On: 11/21/2021 14:59   ECHOCARDIOGRAM COMPLETE  Result Date: 12/26/2021    ECHOCARDIOGRAM REPORT   Patient Name:   GORDON CARLSON Hevener Date of Exam: 12/26/2021 Medical Rec #:  [id number]        Height:       67.0 in Accession #:    [id number]       Weight:       150.1 lb Date of Birth:  1946-11-17       BSA:          1.790 m Patient Age:    68 years         BP:           116/81 mmHg Patient Gender: F                HR:           90 bpm. Exam Location:  [facility] Procedure: 2D Echo, Cardiac Doppler, Color Doppler and Strain Analysis Indications:     Chemo Z09  History:         Patient has prior history of Echocardiogram examinations, most                  recent 08/15/2018. CAD and Acute MI.  Sonographer:     Sherrie Sport Referring Phys:  [id number] [place] [facility] Diagnosing Phys: Kathlyn Sacramento MD  Sonographer Comments: No parasternal window and suboptimal apical window. Global longitudinal strain was attempted. IMPRESSIONS  1. Left ventricular ejection fraction, by estimation, is 60 to 65%. The left ventricle has normal function. Left ventricular endocardial border not  optimally defined to evaluate regional wall motion. There is mild left ventricular hypertrophy. Left ventricular diastolic parameters are consistent with Grade I diastolic dysfunction (impaired relaxation).  2. Right ventricular systolic function is normal. The right ventricular size is normal. Tricuspid regurgitation signal is inadequate for assessing PA pressure.  3. The mitral valve is normal in structure. No evidence of mitral valve regurgitation. No evidence of mitral stenosis.  4. The aortic valve was not well visualized. Aortic valve regurgitation is not visualized. Aortic valve sclerosis/calcification is present, without any evidence of aortic stenosis.  5. Challenging image quality. FINDINGS  Left Ventricle: Left ventricular ejection fraction, by  estimation, is 60 to 65%. The left ventricle has normal function. Left ventricular endocardial border not optimally defined to evaluate regional wall motion. Global longitudinal strain performed but  not reported based on interpreter judgement due to suboptimal tracking. The left ventricular internal cavity size was normal in size. There is mild left ventricular hypertrophy. Left ventricular diastolic parameters are consistent with Grade I diastolic  dysfunction (impaired relaxation). Right Ventricle: The right ventricular size is normal. No increase in right ventricular wall thickness. Right ventricular systolic function is normal. Tricuspid regurgitation signal is inadequate for assessing PA pressure. Left Atrium: Left atrial size was normal in size. Right Atrium: Right atrial size was normal in size. Pericardium: There is no evidence of pericardial effusion. Mitral Valve: The mitral valve is normal in structure. No evidence of mitral valve regurgitation. No evidence of mitral valve stenosis. MV peak gradient, 3.2 mmHg. The mean mitral valve gradient is 2.0 mmHg. Tricuspid Valve: The tricuspid valve is normal in structure. Tricuspid valve regurgitation is not demonstrated. No evidence of tricuspid stenosis. Aortic Valve: The aortic valve was not well visualized. Aortic valve regurgitation is not visualized. Aortic valve sclerosis/calcification is present, without any evidence of aortic stenosis. Aortic valve mean gradient measures 5.0 mmHg. Aortic valve peak gradient measures 8.8 mmHg. Aortic valve area, by VTI measures 2.71 cm. Pulmonic Valve: The pulmonic valve was normal in structure. Pulmonic valve regurgitation is not visualized. No evidence of pulmonic stenosis. Aorta: The aortic root is normal in size and structure. Venous: The inferior vena cava was not well visualized. IAS/Shunts: No atrial level shunt detected by color flow Doppler.  LEFT VENTRICLE PLAX 2D LVIDd:         2.50 cm   Diastology LVIDs:          1.80 cm   LV e' medial:    4.57 cm/s LV PW:         1.10 cm   LV E/e' medial:  14.1 LV IVS:        1.30 cm   LV e' lateral:   5.77 cm/s LVOT diam:     2.00 cm   LV E/e' lateral: 11.1 LV SV:         63 LV SV Index:   35 LVOT Area:     3.14 cm                           3D Volume EF:                          3D EF:        53 %                          LV EDV:  134 ml                          LV ESV:       63 ml                          LV SV:        71 ml RIGHT VENTRICLE RV S prime:     16.00 cm/s TAPSE (M-mode): 3.1 cm LEFT ATRIUM           Index        RIGHT ATRIUM           Index LA diam:      2.80 cm 1.56 cm/m   RA Area:     12.50 cm LA Vol (A2C): 33.3 ml 18.60 ml/m  RA Volume:   30.50 ml  17.04 ml/m LA Vol (A4C): 43.9 ml 24.52 ml/m  AORTIC VALVE AV Area (Vmax):    2.27 cm AV Area (Vmean):   2.19 cm AV Area (VTI):     2.71 cm AV Vmax:           148.00 cm/s AV Vmean:          99.400 cm/s AV VTI:            0.234 m AV Peak Grad:      8.8 mmHg AV Mean Grad:      5.0 mmHg LVOT Vmax:         107.00 cm/s LVOT Vmean:        69.200 cm/s LVOT VTI:          0.202 m LVOT/AV VTI ratio: 0.86  AORTA Ao Root diam: 2.80 cm MITRAL VALVE               TRICUSPID VALVE MV Area (PHT): 4.96 cm    TR Peak grad:   12.2 mmHg MV Area VTI:   3.60 cm    TR Vmax:        175.00 cm/s MV Peak grad:  3.2 mmHg MV Mean grad:  2.0 mmHg    SHUNTS MV Vmax:       0.89 m/s    Systemic VTI:  0.20 m MV Vmean:      63.5 cm/s   Systemic Diam: 2.00 cm MV Decel Time: 153 msec MV E velocity: 64.30 cm/s MV A velocity: 90.80 cm/s MV E/A ratio:  0.71 Kathlyn Sacramento MD Electronically signed by Kathlyn Sacramento MD Signature Date/Time: 12/26/2021/2:06:05 PM    Final       ASSESSMENT & PLAN:  1. Small cell lung cancer ([place])   2. Cerebrovascular accident (CVA), unspecified mechanism ([place])   3. Other fatigue    #Extensive small cell lung cancer, S/p 4 cycles of Carboplatin, Etoposide and Tecentriq.   Currently on Tecentriq maintenance. Hold  immunotherapy due to fatigue/lightheadedness.  #Fatigue and lightheadedness.  Blood pressure is slightly lower than her baseline.  Patient will be given IV fluid hydration. Check cortisol level.  # CVA, follow up with neurology. Continue Asprin 325 mg daily and lovastatin 40 mg.  Follow-up with neurology.  # History bone metastasis and Osteopenia  zometa every 4-6 weeks.  Hold Zometa.  Schedule Zometa next viist.  Continue calcium supplementation..  Follow up in 2 weeks for next cycle of Tecentriq   Earlie Server, MD, PhD

## 2022-01-16 ENCOUNTER — Telehealth: Payer: Medicare Other | Admitting: Internal Medicine

## 2022-01-23 ENCOUNTER — Inpatient Hospital Stay: Payer: Medicare Other | Attending: Internal Medicine | Admitting: Internal Medicine

## 2022-01-23 DIAGNOSIS — I639 Cerebral infarction, unspecified: Secondary | ICD-10-CM | POA: Diagnosis not present

## 2022-01-23 DIAGNOSIS — Z79899 Other long term (current) drug therapy: Secondary | ICD-10-CM | POA: Insufficient documentation

## 2022-01-23 DIAGNOSIS — M8580 Other specified disorders of bone density and structure, unspecified site: Secondary | ICD-10-CM | POA: Insufficient documentation

## 2022-01-23 DIAGNOSIS — C3490 Malignant neoplasm of unspecified part of unspecified bronchus or lung: Secondary | ICD-10-CM | POA: Insufficient documentation

## 2022-01-23 DIAGNOSIS — Z5112 Encounter for antineoplastic immunotherapy: Secondary | ICD-10-CM | POA: Insufficient documentation

## 2022-01-23 NOTE — Progress Notes (Signed)
I connected with Kristi [name] on 01/23/22 at 11:50 AM EST by telephone visit and verified that I am speaking with the correct person using two identifiers.  I discussed the limitations, risks, security and privacy concerns of performing an evaluation and management service by telemedicine and the availability of in-person appointments. I also discussed with the patient that there may be a patient responsible charge related to this service. The patient expressed understanding and agreed to proceed.  Other persons participating in the visit and their role in the encounter:  n/a  Patient's location:  Home  Provider's location:  Office  Chief Complaint:  Cerebrovascular accident (CVA), unspecified mechanism ([place])  History of Present Ilness: Kristi [name] describes no new or progressive changes.  No neurologic complaints.  Continues with chemo through Dr. Tasia Catchings as prior. Observations: Language and cognition at baseline  ECHOCARDIOGRAM REPORT         Patient Name:   Kristi [name] Date of Exam: 12/26/2021  Medical Rec #:  [id number]        Height:       67.0 in  Accession #:    [id number]       Weight:       150.1 lb  Date of Birth:  [birth year]       BSA:          1.790 m  Patient Age:    76 years         BP:           116/81 mmHg  Patient Gender: F                HR:           90 bpm.  Exam Location:  [facility]   Procedure: 2D Echo, Cardiac Doppler, Color Doppler and Strain Analysis   Indications:     Chemo Z09     History:         Patient has prior history of Echocardiogram examinations,  most                   recent 08/15/2018. CAD and Acute MI.     Sonographer:     Sherrie Sport  Referring Phys:  [id number] Acey Lav Truett Mcfarlan  Diagnosing Phys: Kathlyn Sacramento MD      Sonographer Comments: No parasternal window and suboptimal apical window.  Global longitudinal strain was attempted.  IMPRESSIONS     1. Left ventricular ejection fraction, by estimation, is 60 to 65%. The  left ventricle  has normal function. Left ventricular endocardial border  not optimally defined to evaluate regional wall motion. There is mild left  ventricular hypertrophy. Left  ventricular diastolic parameters are consistent with Grade I diastolic  dysfunction (impaired relaxation).   2. Right ventricular systolic function is normal. The right ventricular  size is normal. Tricuspid regurgitation signal is inadequate for assessing  PA pressure.   3. The mitral valve is normal in structure. No evidence of mitral valve  regurgitation. No evidence of mitral stenosis.   4. The aortic valve was not well visualized. Aortic valve regurgitation  is not visualized. Aortic valve sclerosis/calcification is present,  without any evidence of aortic stenosis.   5. Challenging image quality.   Assessment and Plan: Cerebrovascular accident (CVA), unspecified mechanism ([place])  Echocardiogram reviewed.  Recommended continuing full dose ASA and high dose statin for secondary prevention.  Follow Up Instructions: Follow up as needed or with new/progressive symptoms  I discussed  the assessment and treatment plan with the patient.  The patient was provided an opportunity to ask questions and all were answered.  The patient agreed with the plan and demonstrated understanding of the instructions.    The patient was advised to call back or seek an in-person evaluation if the symptoms worsen or if the condition fails to improve as anticipated.  I provided 5-10 minutes of non-face-to-face time during this enocunter.  Ventura Sellers, MD   I provided 15 minutes of non face-to-face telephone visit time during this encounter, and > 50% was spent counseling as documented under my assessment & plan.

## 2022-01-26 ENCOUNTER — Encounter: Payer: Self-pay | Admitting: Oncology

## 2022-01-26 ENCOUNTER — Inpatient Hospital Stay: Payer: Medicare Other

## 2022-01-26 ENCOUNTER — Inpatient Hospital Stay (HOSPITAL_BASED_OUTPATIENT_CLINIC_OR_DEPARTMENT_OTHER): Payer: Medicare Other | Admitting: Oncology

## 2022-01-26 ENCOUNTER — Other Ambulatory Visit: Payer: Self-pay

## 2022-01-26 VITALS — BP 128/70 | HR 97 | Temp 97.5°F | Resp 18 | Wt 146.1 lb

## 2022-01-26 DIAGNOSIS — Z8589 Personal history of malignant neoplasm of other organs and systems: Secondary | ICD-10-CM | POA: Diagnosis not present

## 2022-01-26 DIAGNOSIS — C3490 Malignant neoplasm of unspecified part of unspecified bronchus or lung: Secondary | ICD-10-CM

## 2022-01-26 DIAGNOSIS — Z79899 Other long term (current) drug therapy: Secondary | ICD-10-CM | POA: Diagnosis not present

## 2022-01-26 DIAGNOSIS — I639 Cerebral infarction, unspecified: Secondary | ICD-10-CM | POA: Diagnosis not present

## 2022-01-26 DIAGNOSIS — E860 Dehydration: Secondary | ICD-10-CM

## 2022-01-26 DIAGNOSIS — M8580 Other specified disorders of bone density and structure, unspecified site: Secondary | ICD-10-CM | POA: Diagnosis not present

## 2022-01-26 DIAGNOSIS — Z87891 Personal history of nicotine dependence: Secondary | ICD-10-CM | POA: Diagnosis not present

## 2022-01-26 DIAGNOSIS — Z5112 Encounter for antineoplastic immunotherapy: Secondary | ICD-10-CM

## 2022-01-26 LAB — CBC WITH DIFFERENTIAL/PLATELET
Abs Immature Granulocytes: 0.07 10*3/uL (ref 0.00–0.07)
Basophils Absolute: 0.1 10*3/uL (ref 0.0–0.1)
Basophils Relative: 1 %
Eosinophils Absolute: 0.6 10*3/uL — ABNORMAL HIGH (ref 0.0–0.5)
Eosinophils Relative: 10 %
HCT: 36.7 % (ref 36.0–46.0)
Hemoglobin: 12.2 g/dL (ref 12.0–15.0)
Immature Granulocytes: 1 %
Lymphocytes Relative: 18 %
Lymphs Abs: 1.2 10*3/uL (ref 0.7–4.0)
MCH: 29.4 pg (ref 26.0–34.0)
MCHC: 33.2 g/dL (ref 30.0–36.0)
MCV: 88.4 fL (ref 80.0–100.0)
Monocytes Absolute: 0.9 10*3/uL (ref 0.1–1.0)
Monocytes Relative: 15 %
Neutro Abs: 3.5 10*3/uL (ref 1.7–7.7)
Neutrophils Relative %: 55 %
Platelets: 224 10*3/uL (ref 150–400)
RBC: 4.15 MIL/uL (ref 3.87–5.11)
RDW: 13.2 % (ref 11.5–15.5)
WBC: 6.3 10*3/uL (ref 4.0–10.5)
nRBC: 0 % (ref 0.0–0.2)

## 2022-01-26 LAB — COMPREHENSIVE METABOLIC PANEL
ALT: 54 U/L — ABNORMAL HIGH (ref 0–44)
AST: 60 U/L — ABNORMAL HIGH (ref 15–41)
Albumin: 4 g/dL (ref 3.5–5.0)
Alkaline Phosphatase: 72 U/L (ref 38–126)
Anion gap: 9 (ref 5–15)
BUN: 15 mg/dL (ref 8–23)
CO2: 24 mmol/L (ref 22–32)
Calcium: 9.6 mg/dL (ref 8.9–10.3)
Chloride: 102 mmol/L (ref 98–111)
Creatinine, Ser: 0.83 mg/dL (ref 0.44–1.00)
GFR, Estimated: >60 mL/min (ref >60)
Glucose, Bld: 101 mg/dL — ABNORMAL HIGH (ref 70–99)
Potassium: 3.8 mmol/L (ref 3.5–5.1)
Sodium: 135 mmol/L (ref 135–145)
Total Bilirubin: 0.4 mg/dL (ref 0.3–1.2)
Total Protein: 7.6 g/dL (ref 6.5–8.1)

## 2022-01-26 MED ORDER — ZOLEDRONIC ACID 4 MG/100ML IV SOLN
4.0000 mg | Freq: Once | INTRAVENOUS | Status: AC
  Administered 2022-01-26: 4 mg via INTRAVENOUS
  Filled 2022-01-26: qty 100

## 2022-01-26 MED ORDER — ACETAMINOPHEN 325 MG PO TABS
650.0000 mg | ORAL_TABLET | Freq: Once | ORAL | Status: AC
  Administered 2022-01-26: 650 mg via ORAL
  Filled 2022-01-26: qty 2

## 2022-01-26 MED ORDER — SODIUM CHLORIDE 0.9 % IV SOLN
Freq: Once | INTRAVENOUS | Status: AC
  Filled 2022-01-26: qty 250

## 2022-01-26 MED ORDER — HEPARIN SOD (PORK) LOCK FLUSH 100 UNIT/ML IV SOLN
INTRAVENOUS | Status: AC
  Administered 2022-01-26: 500 [IU]
  Filled 2022-01-26: qty 5

## 2022-01-26 MED ORDER — SODIUM CHLORIDE 0.9 % IV SOLN
1200.0000 mg | Freq: Once | INTRAVENOUS | Status: AC
  Administered 2022-01-26: 1200 mg via INTRAVENOUS
  Filled 2022-01-26: qty 20

## 2022-01-26 NOTE — Progress Notes (Signed)
Hematology/Oncology Follow up note [phone] [phone]   Patient Care Team: Glean Hess, MD as PCP - General (Internal Medicine) Charolette Forward, MD as Consulting Physician (Cardiology) Telford Nab, RN as Registered Nurse [name], MD as Radiation Oncologist (Radiation Oncology)   REASON FOR VISIT:  Follow-up for small cell lung cancer,   HISTORY OF PRESENTING ILLNESS:  Kristi [name] is a  76 y.o.  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  10/20/2018 CT chest with contrast showed large mediastinal mass involving both hilar, left greater than right, consistent with lung carcinoma, The mass causes narrowing of the left mainstem bronchus with resultant volume loss on the left and a mediastinal shift to the left.  Moderate size left pleural effusion. 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  # Initial MRI brain negative.  # Nov 2019- Jan 2020 s/p 4 cycles of Carbo/Etoposide/Tecentriq #  01/24/2019 interim CT scan done which showed continued positive response to therapy with continued reduction in mediastinal adenopathy.  No residual measurable left lung mass. CT findings of acute emphysematous cystitis. Urology did not feel that patient has pyelonephritis and recommend treatment with antibiotics because patient's immunocompromise. Patient finished treatment.   # 11/02/2018-01/09/2019 Chemotherapy carboplatin + Etoposide + tecentriq # Consolidation chest radiation and whole brain radiation finished in May 2020 # 04/25/2019 resume Tecentriq every 3 weeks. # Patient had a fall from her stairs on 01/19/2020. In the emergency room she had CT chest abdomen pelvis CT, CT head without contrast, CT cervical spine without contrast. No CT evidence for acute intracranial abnormality, degenerative changes of cervical spine..  No CT  evidence of acute thoracic abdomen injury.  Severe compression fracture of T7, subacute.  # kyphoplasty procedure on 03/01/2020.  T7 biopsy negative for cancer.  11/20/2020 Surveillance CT scan  Reduced size and prominence of right lateral lung base subpleural nodular density. No findings of active malignancy.  MRI brain showed stable post treatment brain. No metastatic disease.   # 10/29/2021 MRI brain w and wo contrast showed two definite new enhancing lesions in cerebellum, with associated restricted diffusion and increased T2 signal, concerning for new foci of metastatic disease. 2. A third lesion in the right cerebellar hemisphere was likely present on the prior exam but continues to show limited contrast enhancement on SPGR; this lesion is associated with diffusion restriction and increased T2 signal, but, on coronal and sagittal T1 sequences, it correlates with an area affected by pulsation artifact. This is also felt to represent a new focus of metastatic disease.  11/20/2021 PET scan - no FDG avid disease in neck chest abdomen or pelvis.  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 No enhancing metastatic disease seen today, chronic small vessel ischemia.   12/19/2021 was seen by neurology Dr.Vaslow. increase ASA to 325mg  daily. Atorvastatin.  40 mg  INTERVAL HISTORY Kristi [name] is a 76 y.o. female who has above history reviewed by me today presents for evaluation prior to immunotherapy for treatment of extensive small cell lung cancer. Patient reports feeling well today.  Patient reports that phlegm is thick, clear color.  Sometimes she feels nauseated due to not able to cough up thick phlegm.  No other new complaints.  Denies any nausea vomiting diarrhea.     Review of Systems  Constitutional:  Positive for fatigue. Negative for appetite change, chills and fever.   HENT:   Negative for hearing loss and voice change.   Eyes:  Negative for eye problems.  Respiratory:  Negative for chest tightness, cough and shortness of breath.   Cardiovascular:  Negative for chest pain.  Gastrointestinal:  Negative for abdominal distention, abdominal pain, blood in stool, diarrhea and nausea.  Endocrine: Negative for hot flashes.  Genitourinary:  Negative for difficulty urinating and frequency.   Musculoskeletal:  Negative for arthralgias and back pain.  Skin:  Negative for itching and rash.  Neurological:  Negative for extremity weakness, headaches and light-headedness.  Hematological:  Negative for adenopathy.  Psychiatric/Behavioral:  Negative for confusion and depression. The patient is not nervous/anxious.        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[place]) 09/30/2014   1 stent   Claustrophobia    Coronary artery disease    GERD (gastroesophageal reflux disease)    Hypercholesteremia    MI, old    Pneumonia    Small cell lung cancer ([place])    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 ([place]) 10/27/2018    SURGICAL HISTORY: Past Surgical History:  Procedure Laterality Date   APPENDECTOMY     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   BLADDER NECK SUSPENSION     CHOLECYSTECTOMY N/A 08/14/2018   Procedure: LAPAROSCOPIC CHOLECYSTECTOMY;  Surgeon: Jules Husbands, MD;  Location: [facility] OR[facility];  Service: General;  Laterality: N/A;   CHOLECYSTECTOMY  11/2018   CORONARY ANGIOPLASTY  09/29/2014   CORONARY STENT PLACEMENT     ENDOBRONCHIAL ULTRASOUND N/A 10/21/2018   Procedure: ENDOBRONCHIAL ULTRASOUND;  Surgeon: Laverle Hobby, MD;  Location: [facility] OR[facility];  Service: Pulmonary;  Laterality: N/A;   HERNIA REPAIR  2012   IR FLUORO GUIDE CV LINE RIGHT  12/19/2018   KYPHOPLASTY N/A 03/01/2020   Procedure: T7 KYPHOPLASTY;  Surgeon: Hessie Knows, MD;  Location: [facility];  Service: Orthopedics;  Laterality: N/A;   LAPAROTOMY      LEFT HEART CATHETERIZATION WITH CORONARY ANGIOGRAM N/A 09/29/2014   Procedure: LEFT HEART CATHETERIZATION WITH CORONARY ANGIOGRAM;  Surgeon: Clent Demark, MD;  Location: [facility];  Service: Cardiovascular;  Laterality: N/A;   PORTA CATH INSERTION N/A 01/02/2019   Procedure: PORTA CATH INSERTION;  Surgeon: Algernon Huxley, MD;  Location: [facility];  Service: Cardiovascular;  Laterality: N/A;   PORTACATH PLACEMENT Right 10/28/2018   Procedure: INSERTION PORT-A-CATH;  Surgeon: Jules Husbands, MD;  Location: [facility];  Service: General;  Laterality: Right;   XI ROBOTIC ASSISTED VENTRAL HERNIA N/A 02/27/2021   Procedure: XI ROBOTIC ASSISTED VENTRAL HERNIA (incisional) WITH MESH;  Surgeon: Olean Ree, MD;  Location: [facility];  Service: General;  Laterality: N/A;    SOCIAL HISTORY: Social History   Socioeconomic History   Marital status: Married    Spouse name: [name]    Number of children: 3   Years of education: Not on file   Highest education level: Not on file  Occupational History   Occupation: Retired    Comment: Chief Technology Officer   Tobacco Use   Smoking status: Former    Packs/day: 1.00    Years: 39.00    Pack years: 39.00    Types: Cigarettes    Start date: 12/21/1978    Quit date: 10/16/2018    Years since quitting: 3.2   Smokeless tobacco: Never  Vaping  Use   Vaping Use: Never used  Substance and Sexual Activity   Alcohol use: Yes    Alcohol/week: 0.0 standard drinks    Comment: occassional - approx 1 every 2 weeks    Drug use: No   Sexual activity: Yes    Birth control/protection: None  Other Topics Concern   Not on file  Social History Narrative   Not on file   Social Determinants of Health   Financial Resource Strain: Not on file  Food Insecurity: Not on file  Transportation Needs: Not on file  Physical Activity: Not on file  Stress: Not on file  Social Connections: Not on file  Intimate Partner Violence: Not on file    FAMILY HISTORY: Family  History  Problem Relation Age of Onset   Alzheimer's disease Mother    Colon cancer Father    Breast cancer Neg Hx     ALLERGIES:  has No Known Allergies.  MEDICATIONS:  Current Outpatient Medications  Medication Sig Dispense Refill   acetaminophen (TYLENOL) 500 MG tablet Take 2 tablets (1,000 mg total) by mouth every 6 (six) hours as needed for mild pain or headache.     aspirin EC 325 MG EC tablet Take 1 tablet (325 mg total) by mouth daily. Swallow whole.     atorvastatin (LIPITOR) 40 MG tablet Take 1 tablet (40 mg total) by mouth daily. 30 tablet 3   Calcium 600-200 MG-UNIT tablet Take 1 tablet by mouth 2 (two) times daily.     Cholecalciferol (DIALYVITE VITAMIN D 5000) 125 MCG (5000 UT) capsule Take 5,000 Units by mouth daily.     lidocaine-prilocaine (EMLA) cream Apply 1 application topically daily as needed (port access). 30 g 2   Magnesium 250 MG TABS Take 250 mg by mouth daily.     ondansetron (ZOFRAN) 8 MG tablet Take 1 tablet (8 mg total) by mouth every 8 (eight) hours as needed for nausea or vomiting. 60 tablet 1   oxyCODONE (ROXICODONE) 5 MG immediate release tablet Take 1 tablet (5 mg total) by mouth every 12 (twelve) hours as needed for severe pain. 30 tablet 0   pantoprazole (PROTONIX) 40 MG tablet Take 1 tablet (40 mg total) by mouth 2 (two) times daily. 60 tablet 0   prochlorperazine (COMPAZINE) 10 MG tablet Take 10 mg by mouth every 6 (six) hours as needed for nausea or vomiting.     sertraline (ZOLOFT) 100 MG tablet Take 1 tablet (100 mg total) by mouth daily. 90 tablet 3   sucralfate (CARAFATE) 1 g tablet Take 1 g by mouth 3 (three) times daily as needed (heartburn).     traZODone (DESYREL) 50 MG tablet Take 1 tablet (50 mg total) by mouth at bedtime as needed for sleep. 30 tablet 2   vitamin B-12 (CYANOCOBALAMIN) 1000 MCG tablet Take 1,000 mcg by mouth daily.     dexamethasone (DECADRON) 2 MG tablet Take 2 tablets (4 mg total) by mouth 2 (two) times daily. 4 mg BID x  3 days, 4 mg daily x3 days, 2 mg daily x days. (Patient not taking: Reported on 01/26/2022) 21 tablet 0   No current facility-administered medications for this visit.   Facility-Administered Medications Ordered in Other Visits  Medication Dose Route Frequency Provider Last Rate Last Admin   heparin lock flush 100 unit/mL  500 Units Intravenous Once Earlie Server, MD       heparin lock flush 100 unit/mL  500 Units Intravenous Once Earlie Server, MD  sodium chloride flush (NS) 0.9 % injection 10 mL  10 mL Intravenous PRN Earlie Server, MD   10 mL at 01/09/19 0820   sodium chloride flush (NS) 0.9 % injection 10 mL  10 mL Intravenous PRN Earlie Server, MD   10 mL at 01/10/19 1400     PHYSICAL EXAMINATION: ECOG PERFORMANCE STATUS: 1 - Symptomatic but completely ambulatory Vitals:   01/26/22 0929  BP: 128/70  Pulse: 97  Resp: 18  Temp: (!) 97.5 F (36.4 C)  SpO2: 97%   Filed Weights   01/26/22 0929  Weight: 146 lb 1.6 oz (66.3 kg)   Physical Examination Today's Vitals   01/26/22 0929  BP: 128/70  Pulse: 97  Resp: 18  Temp: (!) 97.5 F (36.4 C)  SpO2: 97%  Weight: 146 lb 1.6 oz (66.3 kg)  PainSc: 0-No pain   Body mass index is 22.88 kg/m.   Physical Exam Constitutional:      General: She is not in acute distress.    Appearance: She is not diaphoretic.     Comments: Patient walks independently  HENT:     Head: Normocephalic and atraumatic.     Nose: Nose normal.     Mouth/Throat:     Pharynx: No oropharyngeal exudate.  Eyes:     General: No scleral icterus.    Pupils: Pupils are equal, round, and reactive to light.  Cardiovascular:     Rate and Rhythm: Normal rate and regular rhythm.     Heart sounds: No murmur heard. Pulmonary:     Effort: Pulmonary effort is normal. No respiratory distress.     Breath sounds: No wheezing or rales.     Comments: Decreased breath sounds bilaterally.  Abdominal:     General: There is no distension.     Palpations: Abdomen is soft.      Tenderness: There is no abdominal tenderness.  Musculoskeletal:        General: Normal range of motion.     Cervical back: Normal range of motion and neck supple.  Skin:    General: Skin is warm and dry.     Findings: No erythema.  Neurological:     Mental Status: She is alert and oriented to person, place, and time.     Cranial Nerves: No cranial nerve deficit.     Motor: No abnormal muscle tone.     Coordination: Coordination normal.  Psychiatric:        Mood and Affect: Affect normal.       LABORATORY DATA:  I have reviewed the data as listed Lab Results  Component Value Date   WBC 6.3 01/26/2022   HGB 12.2 01/26/2022   HCT 36.7 01/26/2022   MCV 88.4 01/26/2022   PLT 224 01/26/2022   Recent Labs    12/24/21 0825 01/14/22 0854 01/26/22 0912  NA 134* 137 135  K 3.8 3.7 3.8  CL 104 102 102  CO2 23 25 24   GLUCOSE 97 113* 101*  BUN 21 15 15   CREATININE 0.87 0.90 0.83  CALCIUM 9.0 9.4 9.6  GFRNONAA >60 >60 >60  PROT 7.4 7.1 7.6  ALBUMIN 3.9 3.9 4.0  AST 32 39 60*  ALT 25 29 54*  ALKPHOS 63 63 72  BILITOT 0.7 0.5 0.4     RADIOGRAPHIC STUDIES: I have personally reviewed the radiological images as listed and agreed with the findings in the report.  MR Brain W Wo Contrast  Result Date: 11/22/2021 CLINICAL DATA:  Follow-up metastatic  disease. Whole-brain radiotherapy with new cerebellar lesions under consideration for focus treatment. EXAM: MRI HEAD WITHOUT AND WITH CONTRAST TECHNIQUE: Multiplanar, multiecho pulse sequences of the brain and surrounding structures were obtained without and with intravenous contrast. CONTRAST:  7.75mL GADAVIST GADOBUTROL 1 MMOL/ML IV SOLN COMPARISON:  10/28/2021 FINDINGS: Brain: The areas of cerebellar enhancement have resolved and there is now cleft like T2 hyperintensity in these areas compatible with prior infarcts. There is a background of extensive chronic small vessel ischemia specially in the supratentorial white matter in the  setting of prior whole-brain radiotherapy. Pontine and deep gray nuclei ischemic changes well. No hydrocephalus, collection, or swelling. Vascular: Normal flow voids and vascular enhancement. Skull and upper cervical spine: Normal marrow signal Sinuses/Orbits: Negative IMPRESSION: 1. Resolved sites of cerebellar enhancement best attributed to maturing infarcts. No enhancing metastatic disease seen today. 2. Chronic small vessel ischemia. Electronically Signed   By: Jorje Guild M.D.   On: 11/22/2021 08:53   MR Brain W Wo Contrast  Result Date: 10/29/2021 CLINICAL DATA:  Small cell lung cancer, status post radiation EXAM: MRI HEAD WITHOUT AND WITH CONTRAST TECHNIQUE: Multiplanar, multiecho pulse sequences of the brain and surrounding structures were obtained without and with intravenous contrast. CONTRAST:  62mL GADAVIST GADOBUTROL 1 MMOL/ML IV SOLN COMPARISON:  05/20/2021 normal. FINDINGS: Brain: Multiple foci of restricted diffusion in the cerebellum (series 3, images 56, 57, and 62). A focus in the left cerebellum correlates with an enhancing lesion that measures up to 9 mm (series 1016, image 42, which is new from the prior exam and is associated with increased T2 signal. Additional lesion in the right cerebellum measures up to 6 mm (series 1016, image 139), also new from the prior exam and associated with increased T2 signal. An additional lesion more superiorly in the right cerebellum correlates with a smaller focus of restricted diffusion seen on the prior exam; evaluation for contrast enhancement in this lesion is somewhat limited given its location in areas affected by pulsation artifact on the coronal and sagittal T1 sequences (series 14, image 13 and series 15, image 13), with only minimal enhancement on the axial SPGR (series 1016, image 122). This lesion are associated with T2 hyperintense signal (series 10, image 16) and likely measures up to 9 mm (series 15, image 14). No other restricted  diffusion. No dural thickening. No mass effect or midline shift. No hydrocephalus or extra-axial collection. Redemonstrated T2 hyperintense signal in the periventricular white matter, likely related to prior radiation. Vascular: Normal flow voids. Skull and upper cervical spine: Normal marrow signal. Sinuses/Orbits: Negative. Other: Trace fluid in left mastoid air cells. IMPRESSION: 1. Two definite new enhancing lesions in cerebellum, with associated restricted diffusion and increased T2 signal, concerning for new foci of metastatic disease. 2. A third lesion in the right cerebellar hemisphere was likely present on the prior exam but continues to show limited contrast enhancement on SPGR; this lesion is associated with diffusion restriction and increased T2 signal, but, on coronal and sagittal T1 sequences, it correlates with an area affected by pulsation artifact. This is also felt to represent a new focus of metastatic disease. Electronically Signed   By: Merilyn Baba M.D.   On: 10/29/2021 14:32   NM PET Image Restag (PS) Skull Base To Thigh  Result Date: 11/21/2021 CLINICAL DATA:  Subsequent treatment strategy for lung cancer with ongoing therapy found to have metastatic disease to the brain. EXAM: NUCLEAR MEDICINE PET SKULL BASE TO THIGH TECHNIQUE: 7.7 mCi F-18 FDG was  injected intravenously. Full-ring PET imaging was performed from the skull base to thigh after the radiotracer. CT data was obtained and used for attenuation correction and anatomic localization. Fasting blood glucose: 73 mg/dl COMPARISON:  Multiple prior studies most recent comparison imaging of the chest abdomen and pelvis study from October 17, 2021. FINDINGS: Mediastinal blood pool activity: SUV max 2.85 Liver activity: SUV max NA NECK: No hypermetabolic lymph nodes in the neck. Incidental CT findings: CNS metastases not well seen, refer to prior MRI for additional detail. CHEST: No hypermetabolic mediastinal or hilar nodes. No suspicious  pulmonary nodules on the CT scan. Incidental CT findings: RIGHT-sided Port-A-Cath terminates at the caval to atrial junction. Heart size is normal. Aortic atherosclerosis without aneurysm. Normal caliber of the central pulmonary vasculature. Limited assessment of cardiovascular structures given lack of intravenous contrast. Lungs are clear. Mild pulmonary emphysema. Scarring at the LEFT lung base related to prior post treatment changes. Airways are patent. ABDOMEN/PELVIS: No abnormal hypermetabolic activity within the liver, pancreas, adrenal glands, or spleen. No hypermetabolic lymph nodes in the abdomen or pelvis. Incidental CT findings: Post cholecystectomy. No acute findings relative to liver, spleen, pancreas, adrenal glands, kidneys, urinary bladder, stomach, small or large bowel. Findings of small hiatal hernia. Evidence of previous ventral midline abdominal wall reconstruction/hernia repair. Signs of colonic diverticulosis. Aortic atheromatous plaque with heavy calcification of the abdominal aorta without aneurysmal dilation. Signs of cystocele with descent of the bladder base below the symphysis pubis. The reproductive structures grossly unremarkable. SKELETON: No focal hypermetabolic activity to suggest skeletal metastasis. Incidental CT findings: Degenerative changes throughout the spine. Signs of osteopenia. Cement augmentation related to compression fracture in the midthoracic spine as seen on previous imaging IMPRESSION: No FDG avid disease in the neck, chest, abdomen or pelvis. Signs of pulmonary emphysema and evidence of prior therapy involving the LEFT lower lobe. Aortic atherosclerosis and coronary artery calcification. Thoracic spine compression fractures 1 post cement augmentation as on recent CT imaging. 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 Electronically Signed   By: Zetta Bills M.D.   On: 11/21/2021 14:59   ECHOCARDIOGRAM COMPLETE  Result Date: 12/26/2021     ECHOCARDIOGRAM REPORT   Patient Name:   LESLE FARON Nading Date of Exam: 12/26/2021 Medical Rec #:  [id number]        Height:       67.0 in Accession #:    [id number]       Weight:       150.1 lb Date of Birth:  Sep 04, 1946       BSA:          1.790 m Patient Age:    82 years         BP:           116/81 mmHg Patient Gender: F                HR:           90 bpm. Exam Location:  [facility] Procedure: 2D Echo, Cardiac Doppler, Color Doppler and Strain Analysis Indications:     Chemo Z09  History:         Patient has prior history of Echocardiogram examinations, most                  recent 08/15/2018. CAD and Acute MI.  Sonographer:     Sherrie Sport Referring Phys:  [id number] Acey Lav VASLOW Diagnosing Phys: Kathlyn Sacramento MD  Sonographer Comments: No  parasternal window and suboptimal apical window. Global longitudinal strain was attempted. IMPRESSIONS  1. Left ventricular ejection fraction, by estimation, is 60 to 65%. The left ventricle has normal function. Left ventricular endocardial border not optimally defined to evaluate regional wall motion. There is mild left ventricular hypertrophy. Left ventricular diastolic parameters are consistent with Grade I diastolic dysfunction (impaired relaxation).  2. Right ventricular systolic function is normal. The right ventricular size is normal. Tricuspid regurgitation signal is inadequate for assessing PA pressure.  3. The mitral valve is normal in structure. No evidence of mitral valve regurgitation. No evidence of mitral stenosis.  4. The aortic valve was not well visualized. Aortic valve regurgitation is not visualized. Aortic valve sclerosis/calcification is present, without any evidence of aortic stenosis.  5. Challenging image quality. FINDINGS  Left Ventricle: Left ventricular ejection fraction, by estimation, is 60 to 65%. The left ventricle has normal function. Left ventricular endocardial border not optimally defined to evaluate regional wall motion. Global longitudinal strain  performed but  not reported based on interpreter judgement due to suboptimal tracking. The left ventricular internal cavity size was normal in size. There is mild left ventricular hypertrophy. Left ventricular diastolic parameters are consistent with Grade I diastolic  dysfunction (impaired relaxation). Right Ventricle: The right ventricular size is normal. No increase in right ventricular wall thickness. Right ventricular systolic function is normal. Tricuspid regurgitation signal is inadequate for assessing PA pressure. Left Atrium: Left atrial size was normal in size. Right Atrium: Right atrial size was normal in size. Pericardium: There is no evidence of pericardial effusion. Mitral Valve: The mitral valve is normal in structure. No evidence of mitral valve regurgitation. No evidence of mitral valve stenosis. MV peak gradient, 3.2 mmHg. The mean mitral valve gradient is 2.0 mmHg. Tricuspid Valve: The tricuspid valve is normal in structure. Tricuspid valve regurgitation is not demonstrated. No evidence of tricuspid stenosis. Aortic Valve: The aortic valve was not well visualized. Aortic valve regurgitation is not visualized. Aortic valve sclerosis/calcification is present, without any evidence of aortic stenosis. Aortic valve mean gradient measures 5.0 mmHg. Aortic valve peak gradient measures 8.8 mmHg. Aortic valve area, by VTI measures 2.71 cm. Pulmonic Valve: The pulmonic valve was normal in structure. Pulmonic valve regurgitation is not visualized. No evidence of pulmonic stenosis. Aorta: The aortic root is normal in size and structure. Venous: The inferior vena cava was not well visualized. IAS/Shunts: No atrial level shunt detected by color flow Doppler.  LEFT VENTRICLE PLAX 2D LVIDd:         2.50 cm   Diastology LVIDs:         1.80 cm   LV e' medial:    4.57 cm/s LV PW:         1.10 cm   LV E/e' medial:  14.1 LV IVS:        1.30 cm   LV e' lateral:   5.77 cm/s LVOT diam:     2.00 cm   LV E/e' lateral:  11.1 LV SV:         63 LV SV Index:   35 LVOT Area:     3.14 cm                           3D Volume EF:                          3D EF:  53 %                          LV EDV:       134 ml                          LV ESV:       63 ml                          LV SV:        71 ml RIGHT VENTRICLE RV S prime:     16.00 cm/s TAPSE (M-mode): 3.1 cm LEFT ATRIUM           Index        RIGHT ATRIUM           Index LA diam:      2.80 cm 1.56 cm/m   RA Area:     12.50 cm LA Vol (A2C): 33.3 ml 18.60 ml/m  RA Volume:   30.50 ml  17.04 ml/m LA Vol (A4C): 43.9 ml 24.52 ml/m  AORTIC VALVE AV Area (Vmax):    2.27 cm AV Area (Vmean):   2.19 cm AV Area (VTI):     2.71 cm AV Vmax:           148.00 cm/s AV Vmean:          99.400 cm/s AV VTI:            0.234 m AV Peak Grad:      8.8 mmHg AV Mean Grad:      5.0 mmHg LVOT Vmax:         107.00 cm/s LVOT Vmean:        69.200 cm/s LVOT VTI:          0.202 m LVOT/AV VTI ratio: 0.86  AORTA Ao Root diam: 2.80 cm MITRAL VALVE               TRICUSPID VALVE MV Area (PHT): 4.96 cm    TR Peak grad:   12.2 mmHg MV Area VTI:   3.60 cm    TR Vmax:        175.00 cm/s MV Peak grad:  3.2 mmHg MV Mean grad:  2.0 mmHg    SHUNTS MV Vmax:       0.89 m/s    Systemic VTI:  0.20 m MV Vmean:      63.5 cm/s   Systemic Diam: 2.00 cm MV Decel Time: 153 msec MV E velocity: 64.30 cm/s MV A velocity: 90.80 cm/s MV E/A ratio:  0.71 Kathlyn Sacramento MD Electronically signed by Kathlyn Sacramento MD Signature Date/Time: 12/26/2021/2:06:05 PM    Final       ASSESSMENT & PLAN:  1. Small cell lung cancer ([place])   2. Encounter for antineoplastic immunotherapy   3. Cerebrovascular accident (CVA), unspecified mechanism ([place])   4. 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   Currently on Tecentriq maintenance. Labs reviewed and discussed with patient.  Continue with Tecentriq today.  #Thick phlegm,  recommend  over-the-counter Mucinex  #Fatigue and lightheadedness.  Resolved.  Encourage oral hydration.  Cortisol level was normal. # CVA, follow up with neurology. Continue Asprin 325 mg daily and lovastatin 40 mg.  Follow-up with neurology.  # History bone metastasis and Osteopenia  zometa every 4-6 weeks.  Proceed with Zometa today.  Continue calcium supplementation..  Follow up in 3 weeks for next cycle of Tecentriq   Earlie Server, MD, PhD

## 2022-01-26 NOTE — Patient Instructions (Signed)
[facility] CANCER CTR AT [place]  Discharge Instructions: Thank you for choosing [facility] to provide your oncology and hematology care.  If you have a lab appointment with the [facility] [place], please go directly to the [facility] and check in at the registration area.  Wear comfortable clothing and clothing appropriate for easy access to any Portacath or PICC line.   We strive to give you quality time with your provider. You may need to reschedule your appointment if you arrive late (15 or more minutes).  Arriving late affects you and other patients whose appointments are after yours.  Also, if you miss three or more appointments without notifying the office, you may be dismissed from the clinic at the providers discretion.      For prescription refill requests, have your pharmacy contact our office and allow 72 hours for refills to be completed.    Today you received the following chemotherapy and/or immunotherapy agents : Tecentriq / Zometa    To help prevent nausea and vomiting after your treatment, we encourage you to take your nausea medication as directed.  BELOW ARE SYMPTOMS THAT SHOULD BE REPORTED IMMEDIATELY: *FEVER GREATER THAN 100.4 F (38 C) OR HIGHER *CHILLS OR SWEATING *NAUSEA AND VOMITING THAT IS NOT CONTROLLED WITH YOUR NAUSEA MEDICATION *UNUSUAL SHORTNESS OF BREATH *UNUSUAL BRUISING OR BLEEDING *URINARY PROBLEMS (pain or burning when urinating, or frequent urination) *BOWEL PROBLEMS (unusual diarrhea, constipation, pain near the anus) TENDERNESS IN MOUTH AND THROAT WITH OR WITHOUT PRESENCE OF ULCERS (sore throat, sores in mouth, or a toothache) UNUSUAL RASH, SWELLING OR PAIN  UNUSUAL VAGINAL DISCHARGE OR ITCHING   Items with * indicate a potential emergency and should be followed up as soon as possible or go to the Emergency Department if any problems should occur.  Please show the CHEMOTHERAPY ALERT CARD or IMMUNOTHERAPY ALERT CARD at  check-in to the Emergency Department and triage nurse.  Should you have questions after your visit or need to cancel or reschedule your appointment, please contact [facility] CANCER [place] AT [place]  [phone] and follow the prompts.  Office hours are 8:00 a.m. to 4:30 p.m. Monday - Friday. Please note that voicemails left after 4:00 p.m. may not be returned until the following business day.  We are closed weekends and major holidays. You have access to a nurse at all times for urgent questions. Please call the main number to the clinic [phone] and follow the prompts.  For any non-urgent questions, you may also contact your provider using MyChart. We now offer e-Visits for anyone 65 and older to request care online for non-urgent symptoms. For details visit [email or web address].   Also download the MyChart app! Go to the app store, search "MyChart", open the app, select [place], and log in with your MyChart username and password.  Due to Covid, a mask is required upon entering the hospital/clinic. If you do not have a mask, one will be given to you upon arrival. For doctor visits, patients may have 1 support person aged 78 or older with them. For treatment visits, patients cannot have anyone with them due to current Covid guidelines and our immunocompromised population.

## 2022-01-26 NOTE — Progress Notes (Signed)
Patient here for follow up. Pt reports having thick phlegm.

## 2022-02-16 ENCOUNTER — Inpatient Hospital Stay: Payer: Medicare Other

## 2022-02-16 ENCOUNTER — Inpatient Hospital Stay (HOSPITAL_BASED_OUTPATIENT_CLINIC_OR_DEPARTMENT_OTHER): Payer: Medicare Other | Admitting: Oncology

## 2022-02-16 ENCOUNTER — Encounter: Payer: Self-pay | Admitting: Oncology

## 2022-02-16 ENCOUNTER — Other Ambulatory Visit: Payer: Self-pay

## 2022-02-16 VITALS — BP 95/73 | HR 84 | Temp 96.8°F | Wt 148.0 lb

## 2022-02-16 DIAGNOSIS — C3490 Malignant neoplasm of unspecified part of unspecified bronchus or lung: Secondary | ICD-10-CM

## 2022-02-16 DIAGNOSIS — Z8589 Personal history of malignant neoplasm of other organs and systems: Secondary | ICD-10-CM

## 2022-02-16 DIAGNOSIS — Z5112 Encounter for antineoplastic immunotherapy: Secondary | ICD-10-CM

## 2022-02-16 DIAGNOSIS — R5383 Other fatigue: Secondary | ICD-10-CM

## 2022-02-16 DIAGNOSIS — R7401 Elevation of levels of liver transaminase levels: Secondary | ICD-10-CM

## 2022-02-16 DIAGNOSIS — Z79899 Other long term (current) drug therapy: Secondary | ICD-10-CM | POA: Diagnosis not present

## 2022-02-16 DIAGNOSIS — M8580 Other specified disorders of bone density and structure, unspecified site: Secondary | ICD-10-CM | POA: Diagnosis not present

## 2022-02-16 LAB — COMPREHENSIVE METABOLIC PANEL
ALT: 52 U/L — ABNORMAL HIGH (ref 0–44)
AST: 56 U/L — ABNORMAL HIGH (ref 15–41)
Albumin: 3.8 g/dL (ref 3.5–5.0)
Alkaline Phosphatase: 67 U/L (ref 38–126)
Anion gap: 6 (ref 5–15)
BUN: 18 mg/dL (ref 8–23)
CO2: 26 mmol/L (ref 22–32)
Calcium: 9.5 mg/dL (ref 8.9–10.3)
Chloride: 104 mmol/L (ref 98–111)
Creatinine, Ser: 0.81 mg/dL (ref 0.44–1.00)
GFR, Estimated: >60 mL/min (ref >60)
Glucose, Bld: 87 mg/dL (ref 70–99)
Potassium: 3.6 mmol/L (ref 3.5–5.1)
Sodium: 136 mmol/L (ref 135–145)
Total Bilirubin: 0.2 mg/dL — ABNORMAL LOW (ref 0.3–1.2)
Total Protein: 7.1 g/dL (ref 6.5–8.1)

## 2022-02-16 LAB — CBC WITH DIFFERENTIAL/PLATELET
Abs Immature Granulocytes: 0.06 10*3/uL (ref 0.00–0.07)
Basophils Absolute: 0.1 10*3/uL (ref 0.0–0.1)
Basophils Relative: 1 %
Eosinophils Absolute: 0.6 10*3/uL — ABNORMAL HIGH (ref 0.0–0.5)
Eosinophils Relative: 9 %
HCT: 35.4 % — ABNORMAL LOW (ref 36.0–46.0)
Hemoglobin: 11.8 g/dL — ABNORMAL LOW (ref 12.0–15.0)
Immature Granulocytes: 1 %
Lymphocytes Relative: 21 %
Lymphs Abs: 1.4 10*3/uL (ref 0.7–4.0)
MCH: 29.9 pg (ref 26.0–34.0)
MCHC: 33.3 g/dL (ref 30.0–36.0)
MCV: 89.8 fL (ref 80.0–100.0)
Monocytes Absolute: 0.9 10*3/uL (ref 0.1–1.0)
Monocytes Relative: 13 %
Neutro Abs: 3.4 10*3/uL (ref 1.7–7.7)
Neutrophils Relative %: 55 %
Platelets: 191 10*3/uL (ref 150–400)
RBC: 3.94 MIL/uL (ref 3.87–5.11)
RDW: 13.1 % (ref 11.5–15.5)
WBC: 6.4 10*3/uL (ref 4.0–10.5)
nRBC: 0 % (ref 0.0–0.2)

## 2022-02-16 LAB — TSH: TSH: 1.412 u[IU]/mL (ref 0.350–4.500)

## 2022-02-16 LAB — T4, FREE: Free T4: 1.2 ng/dL — ABNORMAL HIGH (ref 0.61–1.12)

## 2022-02-16 MED ORDER — SODIUM CHLORIDE 0.9 % IV SOLN
Freq: Once | INTRAVENOUS | Status: AC
  Filled 2022-02-16: qty 250

## 2022-02-16 MED ORDER — HEPARIN SOD (PORK) LOCK FLUSH 100 UNIT/ML IV SOLN
INTRAVENOUS | Status: AC
  Filled 2022-02-16: qty 5

## 2022-02-16 MED ORDER — SODIUM CHLORIDE 0.9 % IV SOLN
1200.0000 mg | Freq: Once | INTRAVENOUS | Status: AC
  Administered 2022-02-16: 1200 mg via INTRAVENOUS
  Filled 2022-02-16: qty 20

## 2022-02-16 MED ORDER — ACETAMINOPHEN 325 MG PO TABS
650.0000 mg | ORAL_TABLET | Freq: Once | ORAL | Status: AC
  Administered 2022-02-16: 650 mg via ORAL
  Filled 2022-02-16: qty 2

## 2022-02-16 NOTE — Patient Instructions (Signed)
[facility] CANCER CTR AT [place]  Discharge Instructions: Thank you for choosing [facility] to provide your oncology and hematology care.  If you have a lab appointment with the [facility], please go directly to the [facility] and check in at the registration area.  Wear comfortable clothing and clothing appropriate for easy access to any Portacath or PICC line.   We strive to give you quality time with your provider. You may need to reschedule your appointment if you arrive late (15 or more minutes).  Arriving late affects you and other patients whose appointments are after yours.  Also, if you miss three or more appointments without notifying the office, you may be dismissed from the clinic at the providers discretion.      For prescription refill requests, have your pharmacy contact our office and allow 72 hours for refills to be completed.    Today you received the following chemotherapy and/or immunotherapy agents : Tecentriq   To help prevent nausea and vomiting after your treatment, we encourage you to take your nausea medication as directed.  BELOW ARE SYMPTOMS THAT SHOULD BE REPORTED IMMEDIATELY: *FEVER GREATER THAN 100.4 F (38 C) OR HIGHER *CHILLS OR SWEATING *NAUSEA AND VOMITING THAT IS NOT CONTROLLED WITH YOUR NAUSEA MEDICATION *UNUSUAL SHORTNESS OF BREATH *UNUSUAL BRUISING OR BLEEDING *URINARY PROBLEMS (pain or burning when urinating, or frequent urination) *BOWEL PROBLEMS (unusual diarrhea, constipation, pain near the anus) TENDERNESS IN MOUTH AND THROAT WITH OR WITHOUT PRESENCE OF ULCERS (sore throat, sores in mouth, or a toothache) UNUSUAL RASH, SWELLING OR PAIN  UNUSUAL VAGINAL DISCHARGE OR ITCHING   Items with * indicate a potential emergency and should be followed up as soon as possible or go to the Emergency Department if any problems should occur.  Please show the CHEMOTHERAPY ALERT CARD or IMMUNOTHERAPY ALERT CARD at check-in to  the Emergency Department and triage nurse.  Should you have questions after your visit or need to cancel or reschedule your appointment, please contact [facility] CANCER [place] AT [place]  [phone] and follow the prompts.  Office hours are 8:00 a.m. to 4:30 p.m. Monday - Friday. Please note that voicemails left after 4:00 p.m. may not be returned until the following business day.  We are closed weekends and major holidays. You have access to a nurse at all times for urgent questions. Please call the main number to the clinic [phone] and follow the prompts.  For any non-urgent questions, you may also contact your provider using MyChart. We now offer e-Visits for anyone 12 and older to request care online for non-urgent symptoms. For details visit [email or web address].   Also download the MyChart app! Go to the app store, search "MyChart", open the app, select [place], and log in with your MyChart username and password.  Due to Covid, a mask is required upon entering the hospital/clinic. If you do not have a mask, one will be given to you upon arrival. For doctor visits, patients may have 1 support person aged 43 or older with them. For treatment visits, patients cannot have anyone with them due to current Covid guidelines and our immunocompromised population.

## 2022-02-16 NOTE — Progress Notes (Signed)
Hematology/Oncology Follow up note [phone] [phone]   Patient Care Team: Glean Hess, MD as PCP - General (Internal Medicine) Charolette Forward, MD as Consulting Physician (Cardiology) Telford Nab, RN as Registered Nurse [name], MD as Radiation Oncologist (Radiation Oncology)   REASON FOR VISIT:  Follow-up for small cell lung cancer,   HISTORY OF PRESENTING ILLNESS:  Kristi [name] is a  77 y.o.  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  10/20/2018 CT chest with contrast showed large mediastinal mass involving both hilar, left greater than right, consistent with lung carcinoma, The mass causes narrowing of the left mainstem bronchus with resultant volume loss on the left and a mediastinal shift to the left.  Moderate size left pleural effusion. 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  # Initial MRI brain negative.  # Nov 2019- Jan 2020 s/p 4 cycles of Carbo/Etoposide/Tecentriq #  01/24/2019 interim CT scan done which showed continued positive response to therapy with continued reduction in mediastinal adenopathy.  No residual measurable left lung mass. CT findings of acute emphysematous cystitis. Urology did not feel that patient has pyelonephritis and recommend treatment with antibiotics because patient's immunocompromise. Patient finished treatment.   # 11/02/2018-01/09/2019 Chemotherapy carboplatin + Etoposide + tecentriq # Consolidation chest radiation and whole brain radiation finished in May 2020 # 04/25/2019 resume Tecentriq every 3 weeks. # Patient had a fall from her stairs on 01/19/2020. In the emergency room she had CT chest abdomen pelvis CT, CT head without contrast, CT cervical spine without contrast. No CT evidence for acute intracranial abnormality, degenerative changes of cervical spine..  No CT  evidence of acute thoracic abdomen injury.  Severe compression fracture of T7, subacute.  # kyphoplasty procedure on 03/01/2020.  T7 biopsy negative for cancer.  11/20/2020 Surveillance CT scan  Reduced size and prominence of right lateral lung base subpleural nodular density. No findings of active malignancy.  MRI brain showed stable post treatment brain. No metastatic disease.   # 10/29/2021 MRI brain w and wo contrast showed two definite new enhancing lesions in cerebellum, with associated restricted diffusion and increased T2 signal, concerning for new foci of metastatic disease. 2. A third lesion in the right cerebellar hemisphere was likely present on the prior exam but continues to show limited contrast enhancement on SPGR; this lesion is associated with diffusion restriction and increased T2 signal, but, on coronal and sagittal T1 sequences, it correlates with an area affected by pulsation artifact. This is also felt to represent a new focus of metastatic disease.  11/20/2021 PET scan - no FDG avid disease in neck chest abdomen or pelvis.  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 No enhancing metastatic disease seen today, chronic small vessel ischemia.   12/19/2021 was seen by neurology Dr.Vaslow. increase ASA to 325mg  daily. Atorvastatin.  40 mg  INTERVAL HISTORY Kristi [name] is a 76 y.o. female who has above history reviewed by me today presents for evaluation prior to immunotherapy for treatment of extensive small cell lung cancer. Patient reports feeling well today.   She has no new complaints today. S    Review of Systems  Constitutional:  Positive for fatigue. Negative for appetite change, chills and fever.  HENT:   Negative for hearing loss and voice change.  Eyes:  Negative for eye problems.  Respiratory:  Negative for chest tightness, cough and  shortness of breath.   Cardiovascular:  Negative for chest pain.  Gastrointestinal:  Negative for abdominal distention, abdominal pain, blood in stool, diarrhea and nausea.  Endocrine: Negative for hot flashes.  Genitourinary:  Negative for difficulty urinating and frequency.   Musculoskeletal:  Negative for arthralgias and back pain.  Skin:  Negative for itching and rash.  Neurological:  Negative for extremity weakness, headaches and light-headedness.  Hematological:  Negative for adenopathy.  Psychiatric/Behavioral:  Negative for confusion and depression. The patient is not nervous/anxious.        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[place]) 09/30/2014   1 stent   Claustrophobia    Coronary artery disease    GERD (gastroesophageal reflux disease)    Hypercholesteremia    MI, old    Pneumonia    Small cell lung cancer ([place])    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 ([place]) 10/27/2018    SURGICAL HISTORY: Past Surgical History:  Procedure Laterality Date   APPENDECTOMY     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   BLADDER NECK SUSPENSION     CHOLECYSTECTOMY N/A 08/14/2018   Procedure: LAPAROSCOPIC CHOLECYSTECTOMY;  Surgeon: Jules Husbands, MD;  Location: [facility] ORS;  Service: General;  Laterality: N/A;   CHOLECYSTECTOMY  11/2018   CORONARY ANGIOPLASTY  09/29/2014   CORONARY STENT PLACEMENT     ENDOBRONCHIAL ULTRASOUND N/A 10/21/2018   Procedure: ENDOBRONCHIAL ULTRASOUND;  Surgeon: Laverle Hobby, MD;  Location: [facility] ORS;  Service: Pulmonary;  Laterality: N/A;   HERNIA REPAIR  2012   IR FLUORO GUIDE CV LINE RIGHT  12/19/2018   KYPHOPLASTY N/A 03/01/2020   Procedure: T7 KYPHOPLASTY;  Surgeon: Hessie Knows, MD;  Location: [facility] ORS;  Service: Orthopedics;  Laterality: N/A;   LAPAROTOMY     LEFT HEART CATHETERIZATION WITH CORONARY ANGIOGRAM N/A 09/29/2014   Procedure: LEFT HEART CATHETERIZATION WITH CORONARY ANGIOGRAM;  Surgeon:  Clent Demark, MD;  Location: [place] [facility];  Service: Cardiovascular;  Laterality: N/A;   PORTA CATH INSERTION N/A 01/02/2019   Procedure: PORTA CATH INSERTION;  Surgeon: Algernon Huxley, MD;  Location: [place] [facility];  Service: Cardiovascular;  Laterality: N/A;   PORTACATH PLACEMENT Right 10/28/2018   Procedure: INSERTION PORT-A-CATH;  Surgeon: Jules Husbands, MD;  Location: [facility] OR[facility];  Service: General;  Laterality: Right;   XI ROBOTIC ASSISTED VENTRAL HERNIA N/A 02/27/2021   Procedure: XI ROBOTIC ASSISTED VENTRAL HERNIA (incisional) WITH MESH;  Surgeon: Olean Ree, MD;  Location: [facility] OR[facility];  Service: General;  Laterality: N/A;    SOCIAL HISTORY: Social History   Socioeconomic History   Marital status: Married    Spouse name: [name]    Number of children: 3   Years of education: Not on file   Highest education level: Not on file  Occupational History   Occupation: Retired    Comment: Chief Technology Officer   Tobacco Use   Smoking status: Former    Packs/day: 1.00    Years: 39.00    Pack years: 39.00    Types: Cigarettes    Start date: 12/21/1978    Quit date: 10/16/2018    Years since quitting: 3.3   Smokeless tobacco: Never  Vaping Use   Vaping Use: Never used  Substance and Sexual Activity   Alcohol use: Yes    Alcohol/week: 0.0 standard drinks  Comment: occassional - approx 1 every 2 weeks    Drug use: No   Sexual activity: Yes    Birth control/protection: None  Other Topics Concern   Not on file  Social History Narrative   Not on file   Social Determinants of Health   Financial Resource Strain: Not on file  Food Insecurity: Not on file  Transportation Needs: Not on file  Physical Activity: Not on file  Stress: Not on file  Social Connections: Not on file  Intimate Partner Violence: Not on file    FAMILY HISTORY: Family History  Problem Relation Age of Onset   Alzheimer's disease Mother    Colon cancer Father    Breast cancer Neg Hx     ALLERGIES:  has No  Known Allergies.  MEDICATIONS:  Current Outpatient Medications  Medication Sig Dispense Refill   acetaminophen (TYLENOL) 500 MG tablet Take 2 tablets (1,000 mg total) by mouth every 6 (six) hours as needed for mild pain or headache.     aspirin EC 325 MG EC tablet Take 1 tablet (325 mg total) by mouth daily. Swallow whole.     atorvastatin (LIPITOR) 40 MG tablet Take 1 tablet (40 mg total) by mouth daily. 30 tablet 3   Calcium 600-200 MG-UNIT tablet Take 1 tablet by mouth 2 (two) times daily.     Cholecalciferol (DIALYVITE VITAMIN D 5000) 125 MCG (5000 UT) capsule Take 5,000 Units by mouth daily.     lidocaine-prilocaine (EMLA) cream Apply 1 application topically daily as needed (port access). 30 g 2   Magnesium 250 MG TABS Take 250 mg by mouth daily.     ondansetron (ZOFRAN) 8 MG tablet Take 1 tablet (8 mg total) by mouth every 8 (eight) hours as needed for nausea or vomiting. 60 tablet 1   oxyCODONE (ROXICODONE) 5 MG immediate release tablet Take 1 tablet (5 mg total) by mouth every 12 (twelve) hours as needed for severe pain. 30 tablet 0   pantoprazole (PROTONIX) 40 MG tablet Take 1 tablet (40 mg total) by mouth 2 (two) times daily. 60 tablet 0   prochlorperazine (COMPAZINE) 10 MG tablet Take 10 mg by mouth every 6 (six) hours as needed for nausea or vomiting.     sertraline (ZOLOFT) 100 MG tablet Take 1 tablet (100 mg total) by mouth daily. 90 tablet 3   sucralfate (CARAFATE) 1 g tablet Take 1 g by mouth 3 (three) times daily as needed (heartburn).     traZODone (DESYREL) 50 MG tablet Take 1 tablet (50 mg total) by mouth at bedtime as needed for sleep. 30 tablet 2   vitamin B-12 (CYANOCOBALAMIN) 1000 MCG tablet Take 1,000 mcg by mouth daily.     dexamethasone (DECADRON) 2 MG tablet Take 2 tablets (4 mg total) by mouth 2 (two) times daily. 4 mg BID x 3 days, 4 mg daily x3 days, 2 mg daily x days. (Patient not taking: Reported on 01/26/2022) 21 tablet 0   No current facility-administered  medications for this visit.   Facility-Administered Medications Ordered in Other Visits  Medication Dose Route Frequency Provider Last Rate Last Admin   heparin lock flush 100 unit/mL  500 Units Intravenous Once Earlie Server, MD       heparin lock flush 100 unit/mL  500 Units Intravenous Once Earlie Server, MD       sodium chloride flush (NS) 0.9 % injection 10 mL  10 mL Intravenous PRN Earlie Server, MD   10 mL at 01/09/19  0820   sodium chloride flush (NS) 0.9 % injection 10 mL  10 mL Intravenous PRN Earlie Server, MD   10 mL at 01/10/19 1400     PHYSICAL EXAMINATION: ECOG PERFORMANCE STATUS: 1 - Symptomatic but completely ambulatory Vitals:   02/16/22 0842  BP: 95/73  Pulse: 84  Temp: (!) 96.8 F (36 C)   Filed Weights   02/16/22 0842  Weight: 148 lb (67.1 kg)   Physical Examination Today's Vitals   02/16/22 0842  BP: 95/73  Pulse: 84  Temp: (!) 96.8 F (36 C)  TempSrc: Tympanic  Weight: 148 lb (67.1 kg)  PainSc: 4    Body mass index is 23.18 kg/m.   Physical Exam Constitutional:      General: She is not in acute distress.    Appearance: She is not diaphoretic.     Comments: Patient walks independently  HENT:     Head: Normocephalic and atraumatic.     Nose: Nose normal.     Mouth/Throat:     Pharynx: No oropharyngeal exudate.  Eyes:     General: No scleral icterus.    Pupils: Pupils are equal, round, and reactive to light.  Cardiovascular:     Rate and Rhythm: Normal rate and regular rhythm.     Heart sounds: No murmur heard. Pulmonary:     Effort: Pulmonary effort is normal. No respiratory distress.     Breath sounds: No wheezing or rales.     Comments: Decreased breath sounds bilaterally.  Abdominal:     General: There is no distension.     Palpations: Abdomen is soft.     Tenderness: There is no abdominal tenderness.  Musculoskeletal:        General: Normal range of motion.     Cervical back: Normal range of motion and neck supple.  Skin:    General: Skin is warm  and dry.     Findings: No erythema.  Neurological:     Mental Status: She is alert and oriented to person, place, and time.     Cranial Nerves: No cranial nerve deficit.     Motor: No abnormal muscle tone.     Coordination: Coordination normal.  Psychiatric:        Mood and Affect: Affect normal.       LABORATORY DATA:  I have reviewed the data as listed Lab Results  Component Value Date   WBC 6.4 02/16/2022   HGB 11.8 (L) 02/16/2022   HCT 35.4 (L) 02/16/2022   MCV 89.8 02/16/2022   PLT 191 02/16/2022   Recent Labs    01/14/22 0854 01/26/22 0912 02/16/22 0815  NA 137 135 136  K 3.7 3.8 3.6  CL 102 102 104  CO2 25 24 26   GLUCOSE 113* 101* 87  BUN 15 15 18   CREATININE 0.90 0.83 0.81  CALCIUM 9.4 9.6 9.5  GFRNONAA >60 >60 >60  PROT 7.1 7.6 7.1  ALBUMIN 3.9 4.0 3.8  AST 39 60* 56*  ALT 29 54* 52*  ALKPHOS 63 72 67  BILITOT 0.5 0.4 0.2*     RADIOGRAPHIC STUDIES: I have personally reviewed the radiological images as listed and agreed with the findings in the report.  MR Brain W Wo Contrast  Result Date: 11/22/2021 CLINICAL DATA:  Follow-up metastatic disease. Whole-brain radiotherapy with new cerebellar lesions under consideration for focus treatment. EXAM: MRI HEAD WITHOUT AND WITH CONTRAST TECHNIQUE: Multiplanar, multiecho pulse sequences of the brain and surrounding structures were obtained without and  with intravenous contrast. CONTRAST:  7.77mL GADAVIST GADOBUTROL 1 MMOL/ML IV SOLN COMPARISON:  10/28/2021 FINDINGS: Brain: The areas of cerebellar enhancement have resolved and there is now cleft like T2 hyperintensity in these areas compatible with prior infarcts. There is a background of extensive chronic small vessel ischemia specially in the supratentorial white matter in the setting of prior whole-brain radiotherapy. Pontine and deep gray nuclei ischemic changes well. No hydrocephalus, collection, or swelling. Vascular: Normal flow voids and vascular enhancement.  Skull and upper cervical spine: Normal marrow signal Sinuses/Orbits: Negative IMPRESSION: 1. Resolved sites of cerebellar enhancement best attributed to maturing infarcts. No enhancing metastatic disease seen today. 2. Chronic small vessel ischemia. Electronically Signed   By: Jorje Guild M.D.   On: 11/22/2021 08:53   NM PET Image Restag (PS) Skull Base To Thigh  Result Date: 11/21/2021 CLINICAL DATA:  Subsequent treatment strategy for lung cancer with ongoing therapy found to have metastatic disease to the brain. EXAM: NUCLEAR MEDICINE PET SKULL BASE TO THIGH TECHNIQUE: 7.7 mCi F-18 FDG was injected intravenously. Full-ring PET imaging was performed from the skull base to thigh after the radiotracer. CT data was obtained and used for attenuation correction and anatomic localization. Fasting blood glucose: 73 mg/dl COMPARISON:  Multiple prior studies most recent comparison imaging of the chest abdomen and pelvis study from October 17, 2021. FINDINGS: Mediastinal blood pool activity: SUV max 2.85 Liver activity: SUV max NA NECK: No hypermetabolic lymph nodes in the neck. Incidental CT findings: CNS metastases not well seen, refer to prior MRI for additional detail. CHEST: No hypermetabolic mediastinal or hilar nodes. No suspicious pulmonary nodules on the CT scan. Incidental CT findings: RIGHT-sided Port-A-Cath terminates at the caval to atrial junction. Heart size is normal. Aortic atherosclerosis without aneurysm. Normal caliber of the central pulmonary vasculature. Limited assessment of cardiovascular structures given lack of intravenous contrast. Lungs are clear. Mild pulmonary emphysema. Scarring at the LEFT lung base related to prior post treatment changes. Airways are patent. ABDOMEN/PELVIS: No abnormal hypermetabolic activity within the liver, pancreas, adrenal glands, or spleen. No hypermetabolic lymph nodes in the abdomen or pelvis. Incidental CT findings: Post cholecystectomy. No acute findings  relative to liver, spleen, pancreas, adrenal glands, kidneys, urinary bladder, stomach, small or large bowel. Findings of small hiatal hernia. Evidence of previous ventral midline abdominal wall reconstruction/hernia repair. Signs of colonic diverticulosis. Aortic atheromatous plaque with heavy calcification of the abdominal aorta without aneurysmal dilation. Signs of cystocele with descent of the bladder base below the symphysis pubis. The reproductive structures grossly unremarkable. SKELETON: No focal hypermetabolic activity to suggest skeletal metastasis. Incidental CT findings: Degenerative changes throughout the spine. Signs of osteopenia. Cement augmentation related to compression fracture in the midthoracic spine as seen on previous imaging IMPRESSION: No FDG avid disease in the neck, chest, abdomen or pelvis. Signs of pulmonary emphysema and evidence of prior therapy involving the LEFT lower lobe. Aortic atherosclerosis and coronary artery calcification. Thoracic spine compression fractures 1 post cement augmentation as on recent CT imaging. 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 Electronically Signed   By: Zetta Bills M.D.   On: 11/21/2021 14:59   ECHOCARDIOGRAM COMPLETE  Result Date: 12/26/2021    ECHOCARDIOGRAM REPORT   Patient Name:   Kristi [name] [facility] Date of Exam: 12/26/2021 Medical Rec #:  [id number]        Height:       67.0 in Accession #:    [id number]  Weight:       150.1 lb Date of Birth:  March 20, 1946       BSA:          1.790 m Patient Age:    37 years         BP:           116/81 mmHg Patient Gender: F                HR:           90 bpm. Exam Location:  [facility] Procedure: 2D Echo, Cardiac Doppler, Color Doppler and Strain Analysis Indications:     Chemo Z09  History:         Patient has prior history of Echocardiogram examinations, most                  recent 08/15/2018. CAD and Acute MI.  Sonographer:     Sherrie Sport Referring Phys:  [id number] Acey Lav VASLOW  Diagnosing Phys: Kathlyn Sacramento MD  Sonographer Comments: No parasternal window and suboptimal apical window. Global longitudinal strain was attempted. IMPRESSIONS  1. Left ventricular ejection fraction, by estimation, is 60 to 65%. The left ventricle has normal function. Left ventricular endocardial border not optimally defined to evaluate regional wall motion. There is mild left ventricular hypertrophy. Left ventricular diastolic parameters are consistent with Grade I diastolic dysfunction (impaired relaxation).  2. Right ventricular systolic function is normal. The right ventricular size is normal. Tricuspid regurgitation signal is inadequate for assessing PA pressure.  3. The mitral valve is normal in structure. No evidence of mitral valve regurgitation. No evidence of mitral stenosis.  4. The aortic valve was not well visualized. Aortic valve regurgitation is not visualized. Aortic valve sclerosis/calcification is present, without any evidence of aortic stenosis.  5. Challenging image quality. FINDINGS  Left Ventricle: Left ventricular ejection fraction, by estimation, is 60 to 65%. The left ventricle has normal function. Left ventricular endocardial border not optimally defined to evaluate regional wall motion. Global longitudinal strain performed but  not reported based on interpreter judgement due to suboptimal tracking. The left ventricular internal cavity size was normal in size. There is mild left ventricular hypertrophy. Left ventricular diastolic parameters are consistent with Grade I diastolic  dysfunction (impaired relaxation). Right Ventricle: The right ventricular size is normal. No increase in right ventricular wall thickness. Right ventricular systolic function is normal. Tricuspid regurgitation signal is inadequate for assessing PA pressure. Left Atrium: Left atrial size was normal in size. Right Atrium: Right atrial size was normal in size. Pericardium: There is no evidence of pericardial  effusion. Mitral Valve: The mitral valve is normal in structure. No evidence of mitral valve regurgitation. No evidence of mitral valve stenosis. MV peak gradient, 3.2 mmHg. The mean mitral valve gradient is 2.0 mmHg. Tricuspid Valve: The tricuspid valve is normal in structure. Tricuspid valve regurgitation is not demonstrated. No evidence of tricuspid stenosis. Aortic Valve: The aortic valve was not well visualized. Aortic valve regurgitation is not visualized. Aortic valve sclerosis/calcification is present, without any evidence of aortic stenosis. Aortic valve mean gradient measures 5.0 mmHg. Aortic valve peak gradient measures 8.8 mmHg. Aortic valve area, by VTI measures 2.71 cm. Pulmonic Valve: The pulmonic valve was normal in structure. Pulmonic valve regurgitation is not visualized. No evidence of pulmonic stenosis. Aorta: The aortic root is normal in size and structure. Venous: The inferior vena cava was not well visualized. IAS/Shunts: No atrial level shunt detected by color flow Doppler.  LEFT VENTRICLE PLAX 2D LVIDd:         2.50 cm   Diastology LVIDs:         1.80 cm   LV e' medial:    4.57 cm/s LV PW:         1.10 cm   LV E/e' medial:  14.1 LV IVS:        1.30 cm   LV e' lateral:   5.77 cm/s LVOT diam:     2.00 cm   LV E/e' lateral: 11.1 LV SV:         63 LV SV Index:   35 LVOT Area:     3.14 cm                           3D Volume EF:                          3D EF:        53 %                          LV EDV:       134 ml                          LV ESV:       63 ml                          LV SV:        71 ml RIGHT VENTRICLE RV S prime:     16.00 cm/s TAPSE (M-mode): 3.1 cm LEFT ATRIUM           Index        RIGHT ATRIUM           Index LA diam:      2.80 cm 1.56 cm/m   RA Area:     12.50 cm LA Vol (A2C): 33.3 ml 18.60 ml/m  RA Volume:   30.50 ml  17.04 ml/m LA Vol (A4C): 43.9 ml 24.52 ml/m  AORTIC VALVE AV Area (Vmax):    2.27 cm AV Area (Vmean):   2.19 cm AV Area (VTI):     2.71 cm AV  Vmax:           148.00 cm/s AV Vmean:          99.400 cm/s AV VTI:            0.234 m AV Peak Grad:      8.8 mmHg AV Mean Grad:      5.0 mmHg LVOT Vmax:         107.00 cm/s LVOT Vmean:        69.200 cm/s LVOT VTI:          0.202 m LVOT/AV VTI ratio: 0.86  AORTA Ao Root diam: 2.80 cm MITRAL VALVE               TRICUSPID VALVE MV Area (PHT): 4.96 cm    TR Peak grad:   12.2 mmHg MV Area VTI:   3.60 cm    TR Vmax:        175.00 cm/s MV Peak grad:  3.2 mmHg MV Mean grad:  2.0 mmHg    SHUNTS MV Vmax:       0.89 m/s  Systemic VTI:  0.20 m MV Vmean:      63.5 cm/s   Systemic Diam: 2.00 cm MV Decel Time: 153 msec MV E velocity: 64.30 cm/s MV A velocity: 90.80 cm/s MV E/A ratio:  0.71 Kathlyn Sacramento MD Electronically signed by Kathlyn Sacramento MD Signature Date/Time: 12/26/2021/2:06:05 PM    Final       ASSESSMENT & PLAN:  1. Small cell lung cancer ([place])   2. Encounter for antineoplastic immunotherapy   3. 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 Transaminitis    #Extensive small cell lung cancer, S/p 4 cycles of Carboplatin, Etoposide and Tecentriq.   Currently on Tecentriq maintenance. Labs reviewed and discussed with patient \ continue with Tecentriq today  #Transaminitis, possibly secondary to increased dose of statin.  Other possibility is immunotherapy induced liver toxicity.  Close monitor.  = # CVA, follow up with neurology. Continue Asprin 325 mg daily and lovastatin 40 mg.  Follow-up with neurology.  # History bone metastasis and Osteopenia  zometa every 4-6 weeks. .  Continue calcium supplementation..  Follow up in 3 weeks for next cycle of Tecentriq   Earlie Server, MD, PhD

## 2022-02-27 ENCOUNTER — Encounter: Payer: Self-pay | Admitting: Oncology

## 2022-02-27 NOTE — Addendum Note (Signed)
Addended by: Earlie Server on: 02/27/2022 10:57 PM   Modules accepted: Orders

## 2022-03-09 ENCOUNTER — Inpatient Hospital Stay (HOSPITAL_BASED_OUTPATIENT_CLINIC_OR_DEPARTMENT_OTHER): Payer: Medicare Other | Admitting: Oncology

## 2022-03-09 ENCOUNTER — Other Ambulatory Visit: Payer: Self-pay

## 2022-03-09 ENCOUNTER — Encounter: Payer: Self-pay | Admitting: Oncology

## 2022-03-09 ENCOUNTER — Telehealth: Payer: Self-pay

## 2022-03-09 ENCOUNTER — Inpatient Hospital Stay: Payer: Medicare Other | Attending: Oncology

## 2022-03-09 ENCOUNTER — Inpatient Hospital Stay: Payer: Medicare Other

## 2022-03-09 ENCOUNTER — Ambulatory Visit: Payer: Medicare Other

## 2022-03-09 VITALS — BP 112/80 | HR 89 | Temp 96.6°F | Resp 20 | Wt 146.1 lb

## 2022-03-09 DIAGNOSIS — M8580 Other specified disorders of bone density and structure, unspecified site: Secondary | ICD-10-CM | POA: Insufficient documentation

## 2022-03-09 DIAGNOSIS — I639 Cerebral infarction, unspecified: Secondary | ICD-10-CM | POA: Diagnosis not present

## 2022-03-09 DIAGNOSIS — C3490 Malignant neoplasm of unspecified part of unspecified bronchus or lung: Secondary | ICD-10-CM | POA: Diagnosis not present

## 2022-03-09 DIAGNOSIS — Z5112 Encounter for antineoplastic immunotherapy: Secondary | ICD-10-CM | POA: Diagnosis not present

## 2022-03-09 DIAGNOSIS — E860 Dehydration: Secondary | ICD-10-CM

## 2022-03-09 DIAGNOSIS — Z87891 Personal history of nicotine dependence: Secondary | ICD-10-CM | POA: Insufficient documentation

## 2022-03-09 DIAGNOSIS — R7401 Elevation of levels of liver transaminase levels: Secondary | ICD-10-CM | POA: Diagnosis not present

## 2022-03-09 DIAGNOSIS — Z79899 Other long term (current) drug therapy: Secondary | ICD-10-CM | POA: Insufficient documentation

## 2022-03-09 DIAGNOSIS — Z8589 Personal history of malignant neoplasm of other organs and systems: Secondary | ICD-10-CM

## 2022-03-09 LAB — COMPREHENSIVE METABOLIC PANEL
ALT: 38 U/L (ref 0–44)
AST: 43 U/L — ABNORMAL HIGH (ref 15–41)
Albumin: 3.9 g/dL (ref 3.5–5.0)
Alkaline Phosphatase: 60 U/L (ref 38–126)
Anion gap: 11 (ref 5–15)
BUN: 16 mg/dL (ref 8–23)
CO2: 23 mmol/L (ref 22–32)
Calcium: 9.6 mg/dL (ref 8.9–10.3)
Chloride: 102 mmol/L (ref 98–111)
Creatinine, Ser: 0.9 mg/dL (ref 0.44–1.00)
GFR, Estimated: >60 mL/min (ref >60)
Glucose, Bld: 104 mg/dL — ABNORMAL HIGH (ref 70–99)
Potassium: 3.6 mmol/L (ref 3.5–5.1)
Sodium: 136 mmol/L (ref 135–145)
Total Bilirubin: 0.2 mg/dL — ABNORMAL LOW (ref 0.3–1.2)
Total Protein: 7.3 g/dL (ref 6.5–8.1)

## 2022-03-09 LAB — CBC WITH DIFFERENTIAL/PLATELET
Abs Immature Granulocytes: 0.05 10*3/uL (ref 0.00–0.07)
Basophils Absolute: 0.1 10*3/uL (ref 0.0–0.1)
Basophils Relative: 1 %
Eosinophils Absolute: 0.4 10*3/uL (ref 0.0–0.5)
Eosinophils Relative: 7 %
HCT: 36.6 % (ref 36.0–46.0)
Hemoglobin: 12 g/dL (ref 12.0–15.0)
Immature Granulocytes: 1 %
Lymphocytes Relative: 23 %
Lymphs Abs: 1.5 10*3/uL (ref 0.7–4.0)
MCH: 29.6 pg (ref 26.0–34.0)
MCHC: 32.8 g/dL (ref 30.0–36.0)
MCV: 90.1 fL (ref 80.0–100.0)
Monocytes Absolute: 0.9 10*3/uL (ref 0.1–1.0)
Monocytes Relative: 15 %
Neutro Abs: 3.4 10*3/uL (ref 1.7–7.7)
Neutrophils Relative %: 53 %
Platelets: 201 10*3/uL (ref 150–400)
RBC: 4.06 MIL/uL (ref 3.87–5.11)
RDW: 13.4 % (ref 11.5–15.5)
WBC: 6.3 10*3/uL (ref 4.0–10.5)
nRBC: 0 % (ref 0.0–0.2)

## 2022-03-09 MED ORDER — SODIUM CHLORIDE 0.9 % IV SOLN
Freq: Once | INTRAVENOUS | Status: AC
  Filled 2022-03-09: qty 250

## 2022-03-09 MED ORDER — SODIUM CHLORIDE 0.9 % IV SOLN
Freq: Once | INTRAVENOUS | Status: DC
  Filled 2022-03-09: qty 250

## 2022-03-09 MED ORDER — SODIUM CHLORIDE 0.9 % IV SOLN
1200.0000 mg | Freq: Once | INTRAVENOUS | Status: AC
  Administered 2022-03-09: 1200 mg via INTRAVENOUS
  Filled 2022-03-09: qty 20

## 2022-03-09 MED ORDER — ZOLEDRONIC ACID 4 MG/100ML IV SOLN
4.0000 mg | Freq: Once | INTRAVENOUS | Status: AC
  Administered 2022-03-09: 4 mg via INTRAVENOUS
  Filled 2022-03-09: qty 100

## 2022-03-09 MED ORDER — HEPARIN SOD (PORK) LOCK FLUSH 100 UNIT/ML IV SOLN
500.0000 [IU] | Freq: Once | INTRAVENOUS | Status: AC | PRN
  Administered 2022-03-09: 500 [IU]
  Filled 2022-03-09: qty 5

## 2022-03-09 MED ORDER — ACETAMINOPHEN 325 MG PO TABS
650.0000 mg | ORAL_TABLET | Freq: Once | ORAL | Status: AC
  Administered 2022-03-09: 650 mg via ORAL
  Filled 2022-03-09: qty 2

## 2022-03-09 NOTE — Telephone Encounter (Signed)
Referral for [facility] PT/OT/ health aide faxed to [facility]. Received message from Tresea Mall with [facility] that patient has been accepted and they attempted to contact patient but had to leave VM.  ?

## 2022-03-09 NOTE — Patient Instructions (Signed)
[facility]  Discharge Instructions: ?Thank you for choosing [facility] to provide your oncology and hematology care.  ?If you have a lab appointment with the Cancer Center, please go directly to the Cancer Center and check in at the registration area. ? ?Wear comfortable clothing and clothing appropriate for easy access to any Portacath or PICC line.  ? ?We strive to give you quality time with your provider. You may need to reschedule your appointment if you arrive late (15 or more minutes).  Arriving late affects you and other patients whose appointments are after yours.  Also, if you miss three or more appointments without notifying the office, you may be dismissed from the clinic at the provider?s discretion.    ?  ?For prescription refill requests, have your pharmacy contact our office and allow 72 hours for refills to be completed.   ? ?  ?To help prevent nausea and vomiting after your treatment, we encourage you to take your nausea medication as directed. ? ?BELOW ARE SYMPTOMS THAT SHOULD BE REPORTED IMMEDIATELY: ?*FEVER GREATER THAN 100.4 F (38 ?C) OR HIGHER ?*CHILLS OR SWEATING ?*NAUSEA AND VOMITING THAT IS NOT CONTROLLED WITH YOUR NAUSEA MEDICATION ?*UNUSUAL SHORTNESS OF BREATH ?*UNUSUAL BRUISING OR BLEEDING ?*URINARY PROBLEMS (pain or burning when urinating, or frequent urination) ?*BOWEL PROBLEMS (unusual diarrhea, constipation, pain near the anus) ?TENDERNESS IN MOUTH AND THROAT WITH OR WITHOUT PRESENCE OF ULCERS (sore throat, sores in mouth, or a toothache) ?UNUSUAL RASH, SWELLING OR PAIN  ?UNUSUAL VAGINAL DISCHARGE OR ITCHING  ? ?Items with * indicate a potential emergency and should be followed up as soon as possible or go to the Emergency Department if any problems should occur. ? ?Please show the CHEMOTHERAPY ALERT CARD or IMMUNOTHERAPY ALERT CARD at check-in to the Emergency Department and triage nurse. ? ?Should you have questions after your visit  or need to cancel or reschedule your appointment, please contact [facility]  [phone] and follow the prompts.  Office hours are 8:00 a.m. to 4:30 p.m. Monday - Friday. Please note that voicemails left after 4:00 p.m. may not be returned until the following business day.  We are closed weekends and major holidays. You have access to a nurse at all times for urgent questions. Please call the main number to the clinic [phone] and follow the prompts. ? ?For any non-urgent questions, you may also contact your provider using MyChart. We now offer e-Visits for anyone 18 and older to request care online for non-urgent symptoms. For details visit [email or web address]. ?  ?Also download the MyChart app! Go to the app store, search "MyChart", open the app, select [facility], and log in with your MyChart username and password. ? ?Due to Covid, a mask is required upon entering the hospital/clinic. If you do not have a mask, one will be given to you upon arrival. For doctor visits, patients may have 1 support person aged 18 or older with them. For treatment visits, patients cannot have anyone with them due to current Covid guidelines and our immunocompromised population.  ?

## 2022-03-09 NOTE — Progress Notes (Signed)
Patient states she is weaker in her legs today. Patient denies any pain at the moment.  ?

## 2022-03-09 NOTE — Progress Notes (Signed)
?Hematology/Oncology Follow up note ?[phone] [phone] ? ? ?Patient Care Team: ?Glean Hess, MD as PCP - General (Internal Medicine) ?Charolette Forward, MD as Consulting Physician (Cardiology) ?Telford Nab, RN as Equities trader ?[name], MD as Radiation Oncologist (Radiation Oncology) ? ? ?REASON FOR VISIT:  ?Follow-up for small cell lung cancer,  ? ?HISTORY OF PRESENTING ILLNESS:  ?Kristi TOLLESON is a  76 y.o.  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 ? ?10/20/2018 CT chest with contrast showed large mediastinal mass involving both hilar, left greater than right, consistent with lung carcinoma, The mass causes narrowing of the left mainstem bronchus with resultant volume loss on the left and a mediastinal shift to the left.  Moderate size left pleural effusion. 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 ? # Initial MRI brain negative.  ?# Nov 2019- Jan 2020 s/p 4 cycles of Carbo/Etoposide/Tecentriq ?#  01/24/2019 interim CT scan done which showed continued positive response to therapy with continued reduction in mediastinal adenopathy.  No residual measurable left lung mass. ?CT findings of acute emphysematous cystitis. Urology did not feel that patient has pyelonephritis and recommend treatment with antibiotics because patient's immunocompromise. Patient finished treatment.  ? ?# 11/02/2018-01/09/2019 Chemotherapy carboplatin + Etoposide + tecentriq ?# Consolidation chest radiation and whole brain radiation finished in May 2020 ?# 04/25/2019 resume Tecentriq every 3 weeks. ?# Patient had a fall from her stairs on 01/19/2020. ?In the emergency room she had CT chest abdomen pelvis CT, CT head without contrast, CT cervical spine without contrast. ?No CT evidence for acute intracranial abnormality, degenerative changes of cervical spine..  No CT  evidence of acute thoracic abdomen injury.  Severe compression fracture of T7, subacute. ? ?# kyphoplasty procedure on 03/01/2020.  T7 biopsy negative for cancer. ? ?11/20/2020 Surveillance CT scan  Reduced size and prominence of right lateral lung base subpleural nodular density. No findings of active malignancy.  ?MRI brain showed stable post treatment brain. No metastatic disease.  ? ?# 10/29/2021 MRI brain w and wo contrast showed two definite new enhancing lesions in cerebellum, with associated restricted diffusion and increased T2 signal, concerning for new foci of metastatic disease. ?2. A third lesion in the right cerebellar hemisphere was likely present on the prior exam but continues to show limited contrast enhancement on SPGR; this lesion is associated with diffusion restriction and increased T2 signal, but, on coronal and sagittal T1 sequences, it correlates with an area affected by pulsation artifact. This is also felt to represent a new focus of metastatic ?disease. ? ?11/20/2021 PET scan - no FDG avid disease in neck chest abdomen or pelvis. ? ?Patient was sent to Dr.Chrystal and 11/21/2021 MRI brain was repeated and showed resolved sites of cerebellar enhancement best attributed to maturing infarcts. No enhancing metastatic disease seen today, chronic small vessel ischemia.  ? ?12/19/2021 was seen by neurology Dr.Vaslow. increase ASA to 325mg  daily. Atorvastatin.  40 mg ? ?INTERVAL HISTORY ?Kristi [name] is a 76 y.o. female who has above history reviewed by me today presents for evaluation prior to immunotherapy for treatment of extensive small cell lung cancer. ?patient reports gradual progression of lower extremity weakness.  Patient was previously with home physical therapy and has not followed up yet.  She walks with a walker.  Accompanied by husband. ? ? ? ?Review of Systems  ?Constitutional:  Positive for fatigue. Negative for  appetite change, chills and fever.  ?HENT:   Negative for hearing  loss and voice change.   ?Eyes:  Negative for eye problems.  ?Respiratory:  Negative for chest tightness, cough and shortness of breath.   ?Cardiovascular:  Negative for chest pain.  ?Gastrointestinal:  Negative for abdominal distention, abdominal pain, blood in stool, diarrhea and nausea.  ?Endocrine: Negative for hot flashes.  ?Genitourinary:  Negative for difficulty urinating and frequency.   ?Musculoskeletal:  Negative for arthralgias and back pain.  ?     Lower extremity weakness  ?Skin:  Negative for itching and rash.  ?Neurological:  Negative for extremity weakness, headaches and light-headedness.  ?Hematological:  Negative for adenopathy.  ?Psychiatric/Behavioral:  Negative for confusion and depression. The patient is not nervous/anxious.   ?     Forgetful  ? ? ?MEDICAL HISTORY:  ?Past Medical History:  ?Diagnosis Date  ? Acute MI, inferoposterior wall ([place]) 09/30/2014  ? 1 stent  ? Claustrophobia   ? Coronary artery disease   ? GERD (gastroesophageal reflux disease)   ? Hypercholesteremia   ? MI, old   ? Pneumonia   ? Small cell lung cancer ([place])   ? 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 [facility]) 10/27/2018  ? ? ?SURGICAL HISTORY: ?Past Surgical History:  ?Procedure Laterality Date  ? APPENDECTOMY    ? 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  ? BLADDER NECK SUSPENSION    ? CHOLECYSTECTOMY N/A 08/14/2018  ? Procedure: LAPAROSCOPIC CHOLECYSTECTOMY;  Surgeon: Jules Husbands, MD;  Location: [facility] ORS;  Service: General;  Laterality: N/A;  ? CHOLECYSTECTOMY  11/2018  ? CORONARY ANGIOPLASTY  09/29/2014  ? CORONARY STENT PLACEMENT    ? ENDOBRONCHIAL ULTRASOUND N/A 10/21/2018  ? Procedure: ENDOBRONCHIAL ULTRASOUND;  Surgeon: Laverle Hobby, MD;  Location: [facility] ORS;  Service: Pulmonary;  Laterality: N/A;  ? HERNIA REPAIR  2012  ? IR FLUORO GUIDE CV LINE RIGHT  12/19/2018  ? KYPHOPLASTY N/A 03/01/2020  ? Procedure: T7 KYPHOPLASTY;  Surgeon: Hessie Knows, MD;  Location: [facility] ORS;  Service: Orthopedics;  Laterality: N/A;  ? LAPAROTOMY     ? LEFT HEART CATHETERIZATION WITH CORONARY ANGIOGRAM N/A 09/29/2014  ? Procedure: LEFT HEART CATHETERIZATION WITH CORONARY ANGIOGRAM;  Surgeon: Clent Demark, MD;  Location[facility] C[facility] LAB;  Service: Cardiovascular;  Laterality: N/A;  ? PORTA CATH INSERTION N/A 01/02/2019  ? Procedure: PORTA CATH INSERTION;  Surgeon: Algernon Huxley, MD;  Location: [facility] CV LAB;  Service: Cardiovascular;  Laterality: N/A;  ? PORTACATH PLACEMENT Right 10/28/2018  ? Procedure: INSERTION PORT-A-CATH;  Surgeon: Jules Husbands, MD;  Location: [facility] ORS;  Service: General;  Laterality: Right;  ? XI ROBOTIC ASSISTED VENTRAL HERNIA N/A 02/27/2021  ? Procedure: XI ROBOTIC ASSISTED VENTRAL HERNIA (incisional) WITH MESH;  Surgeon: Olean Ree, MD;  Location: [facility] ORS;  Service: General;  Laterality: N/A;  ? ? ?SOCIAL HISTORY: ?Social History  ? ?Socioeconomic History  ? Marital status: Married  ?  Spouse name: [name]   ? Number of children: 3  ? Years of education: Not on file  ? Highest education level: Not on file  ?Occupational History  ? 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 Smoking status: Former  ?  Packs/day: 1.00  ?  Years: 39.00  ?  Pack years: 39.00  ?  Types: Cigarettes  ?  Start date: 12/21/1978  ?  Quit date: 10/16/2018  ?  Years since quitting: 3.3  ? Smokeless tobacco: Never  ?  Vaping Use  ? Vaping Use: Never used  ?Substance and Sexual Activity  ? Alcohol use: Yes  ?  Alcohol/week: 0.0 standard drinks  ?  Comment: occassional - approx 1 every 2 weeks   ? Drug use: No  ? Sexual activity: Yes  ?  Birth control/protection: None  ?Other Topics Concern  ? Not on file  ?Social History Narrative  ? Not on file  ? ?Social Determinants of Health  ? ?Financial Resource Strain: Not on file  ?Food Insecurity: Not on file  ?Transportation Needs: Not on file  ?Physical Activity: Not on file  ?Stress: Not on file  ?Social Connections: Not on file  ?Intimate Partner Violence: Not on file  ? ? ?FAMILY HISTORY: ?Family  History  ?Problem Relation Age of Onset  ? Alzheimer's disease Mother   ? Colon cancer Father   ? Breast cancer Neg Hx   ? ? ?ALLERGIES:  has No Known Allergies. ? ?MEDICATIONS:  ?Current Outpatient Med

## 2022-03-12 ENCOUNTER — Emergency Department: Payer: Medicare Other

## 2022-03-12 ENCOUNTER — Other Ambulatory Visit: Payer: Self-pay

## 2022-03-12 ENCOUNTER — Inpatient Hospital Stay
Admission: EM | Admit: 2022-03-12 | Discharge: 2022-03-18 | DRG: 176 | Disposition: A | Discharge status: Skilled Nursing Facility | Payer: Medicare Other | Attending: Obstetrics and Gynecology | Admitting: Obstetrics and Gynecology

## 2022-03-12 DIAGNOSIS — R29898 Other symptoms and signs involving the musculoskeletal system: Secondary | ICD-10-CM | POA: Diagnosis not present

## 2022-03-12 DIAGNOSIS — I959 Hypotension, unspecified: Secondary | ICD-10-CM | POA: Diagnosis not present

## 2022-03-12 DIAGNOSIS — R262 Difficulty in walking, not elsewhere classified: Secondary | ICD-10-CM | POA: Diagnosis present

## 2022-03-12 DIAGNOSIS — M2578 Osteophyte, vertebrae: Secondary | ICD-10-CM | POA: Diagnosis present

## 2022-03-12 DIAGNOSIS — Z8673 Personal history of transient ischemic attack (TIA), and cerebral infarction without residual deficits: Secondary | ICD-10-CM

## 2022-03-12 DIAGNOSIS — I10 Essential (primary) hypertension: Secondary | ICD-10-CM | POA: Diagnosis not present

## 2022-03-12 DIAGNOSIS — R29818 Other symptoms and signs involving the nervous system: Secondary | ICD-10-CM | POA: Diagnosis not present

## 2022-03-12 DIAGNOSIS — I6523 Occlusion and stenosis of bilateral carotid arteries: Secondary | ICD-10-CM | POA: Diagnosis not present

## 2022-03-12 DIAGNOSIS — M5030 Other cervical disc degeneration, unspecified cervical region: Secondary | ICD-10-CM | POA: Diagnosis not present

## 2022-03-12 DIAGNOSIS — C3490 Malignant neoplasm of unspecified part of unspecified bronchus or lung: Secondary | ICD-10-CM | POA: Diagnosis not present

## 2022-03-12 DIAGNOSIS — J99 Respiratory disorders in diseases classified elsewhere: Secondary | ICD-10-CM | POA: Diagnosis not present

## 2022-03-12 DIAGNOSIS — R488 Other symbolic dysfunctions: Secondary | ICD-10-CM | POA: Diagnosis not present

## 2022-03-12 DIAGNOSIS — Z20822 Contact with and (suspected) exposure to covid-19: Secondary | ICD-10-CM | POA: Diagnosis present

## 2022-03-12 DIAGNOSIS — Z8669 Personal history of other diseases of the nervous system and sense organs: Secondary | ICD-10-CM | POA: Diagnosis not present

## 2022-03-12 DIAGNOSIS — S22069A Unspecified fracture of T7-T8 vertebra, initial encounter for closed fracture: Secondary | ICD-10-CM | POA: Diagnosis present

## 2022-03-12 DIAGNOSIS — G4700 Insomnia, unspecified: Secondary | ICD-10-CM | POA: Diagnosis not present

## 2022-03-12 DIAGNOSIS — R2681 Unsteadiness on feet: Secondary | ICD-10-CM | POA: Diagnosis not present

## 2022-03-12 DIAGNOSIS — Z736 Limitation of activities due to disability: Secondary | ICD-10-CM | POA: Diagnosis not present

## 2022-03-12 DIAGNOSIS — I2699 Other pulmonary embolism without acute cor pulmonale: Principal | ICD-10-CM | POA: Diagnosis present

## 2022-03-12 DIAGNOSIS — Z9049 Acquired absence of other specified parts of digestive tract: Secondary | ICD-10-CM

## 2022-03-12 DIAGNOSIS — Z7982 Long term (current) use of aspirin: Secondary | ICD-10-CM

## 2022-03-12 DIAGNOSIS — R258 Other abnormal involuntary movements: Secondary | ICD-10-CM | POA: Diagnosis present

## 2022-03-12 DIAGNOSIS — K219 Gastro-esophageal reflux disease without esophagitis: Secondary | ICD-10-CM | POA: Diagnosis present

## 2022-03-12 DIAGNOSIS — W19XXXS Unspecified fall, sequela: Secondary | ICD-10-CM | POA: Diagnosis present

## 2022-03-12 DIAGNOSIS — Z79899 Other long term (current) drug therapy: Secondary | ICD-10-CM

## 2022-03-12 DIAGNOSIS — E440 Moderate protein-calorie malnutrition: Secondary | ICD-10-CM | POA: Diagnosis not present

## 2022-03-12 DIAGNOSIS — M6281 Muscle weakness (generalized): Secondary | ICD-10-CM | POA: Diagnosis not present

## 2022-03-12 DIAGNOSIS — G8929 Other chronic pain: Secondary | ICD-10-CM | POA: Diagnosis present

## 2022-03-12 DIAGNOSIS — Z6822 Body mass index (BMI) 22.0-22.9, adult: Secondary | ICD-10-CM | POA: Diagnosis not present

## 2022-03-12 DIAGNOSIS — G479 Sleep disorder, unspecified: Secondary | ICD-10-CM | POA: Diagnosis not present

## 2022-03-12 DIAGNOSIS — M5134 Other intervertebral disc degeneration, thoracic region: Secondary | ICD-10-CM | POA: Diagnosis present

## 2022-03-12 DIAGNOSIS — Z95828 Presence of other vascular implants and grafts: Secondary | ICD-10-CM

## 2022-03-12 DIAGNOSIS — I2510 Atherosclerotic heart disease of native coronary artery without angina pectoris: Secondary | ICD-10-CM | POA: Diagnosis present

## 2022-03-12 DIAGNOSIS — M5124 Other intervertebral disc displacement, thoracic region: Secondary | ICD-10-CM | POA: Diagnosis not present

## 2022-03-12 DIAGNOSIS — R269 Unspecified abnormalities of gait and mobility: Principal | ICD-10-CM

## 2022-03-12 DIAGNOSIS — M4854XS Collapsed vertebra, not elsewhere classified, thoracic region, sequela of fracture: Secondary | ICD-10-CM | POA: Diagnosis present

## 2022-03-12 DIAGNOSIS — M4802 Spinal stenosis, cervical region: Secondary | ICD-10-CM | POA: Diagnosis present

## 2022-03-12 DIAGNOSIS — Z800 Family history of malignant neoplasm of digestive organs: Secondary | ICD-10-CM

## 2022-03-12 DIAGNOSIS — I639 Cerebral infarction, unspecified: Secondary | ICD-10-CM | POA: Diagnosis not present

## 2022-03-12 DIAGNOSIS — M6259 Muscle wasting and atrophy, not elsewhere classified, multiple sites: Secondary | ICD-10-CM | POA: Diagnosis not present

## 2022-03-12 DIAGNOSIS — G319 Degenerative disease of nervous system, unspecified: Secondary | ICD-10-CM | POA: Diagnosis not present

## 2022-03-12 DIAGNOSIS — Z955 Presence of coronary angioplasty implant and graft: Secondary | ICD-10-CM | POA: Diagnosis not present

## 2022-03-12 DIAGNOSIS — R498 Other voice and resonance disorders: Secondary | ICD-10-CM | POA: Diagnosis not present

## 2022-03-12 DIAGNOSIS — M545 Low back pain, unspecified: Secondary | ICD-10-CM | POA: Diagnosis present

## 2022-03-12 DIAGNOSIS — I6529 Occlusion and stenosis of unspecified carotid artery: Secondary | ICD-10-CM | POA: Diagnosis not present

## 2022-03-12 DIAGNOSIS — I110 Hypertensive heart disease with heart failure: Secondary | ICD-10-CM | POA: Diagnosis present

## 2022-03-12 DIAGNOSIS — R2689 Other abnormalities of gait and mobility: Secondary | ICD-10-CM | POA: Diagnosis not present

## 2022-03-12 DIAGNOSIS — C3402 Malignant neoplasm of left main bronchus: Secondary | ICD-10-CM | POA: Diagnosis present

## 2022-03-12 DIAGNOSIS — Z87891 Personal history of nicotine dependence: Secondary | ICD-10-CM

## 2022-03-12 DIAGNOSIS — I5032 Chronic diastolic (congestive) heart failure: Secondary | ICD-10-CM | POA: Diagnosis present

## 2022-03-12 DIAGNOSIS — M5126 Other intervertebral disc displacement, lumbar region: Secondary | ICD-10-CM | POA: Diagnosis not present

## 2022-03-12 DIAGNOSIS — F32A Depression, unspecified: Secondary | ICD-10-CM | POA: Diagnosis not present

## 2022-03-12 DIAGNOSIS — I252 Old myocardial infarction: Secondary | ICD-10-CM

## 2022-03-12 DIAGNOSIS — I5033 Acute on chronic diastolic (congestive) heart failure: Secondary | ICD-10-CM | POA: Diagnosis not present

## 2022-03-12 DIAGNOSIS — R5383 Other fatigue: Secondary | ICD-10-CM | POA: Diagnosis not present

## 2022-03-12 DIAGNOSIS — M488X5 Other specified spondylopathies, thoracolumbar region: Secondary | ICD-10-CM | POA: Diagnosis not present

## 2022-03-12 DIAGNOSIS — I5189 Other ill-defined heart diseases: Secondary | ICD-10-CM | POA: Diagnosis not present

## 2022-03-12 DIAGNOSIS — E569 Vitamin deficiency, unspecified: Secondary | ICD-10-CM | POA: Diagnosis not present

## 2022-03-12 DIAGNOSIS — Z820 Family history of epilepsy and other diseases of the nervous system: Secondary | ICD-10-CM

## 2022-03-12 DIAGNOSIS — E782 Mixed hyperlipidemia: Secondary | ICD-10-CM | POA: Diagnosis present

## 2022-03-12 DIAGNOSIS — Z86711 Personal history of pulmonary embolism: Secondary | ICD-10-CM | POA: Diagnosis not present

## 2022-03-12 DIAGNOSIS — I771 Stricture of artery: Secondary | ICD-10-CM | POA: Diagnosis not present

## 2022-03-12 DIAGNOSIS — E785 Hyperlipidemia, unspecified: Secondary | ICD-10-CM | POA: Diagnosis not present

## 2022-03-12 DIAGNOSIS — S22069D Unspecified fracture of T7-T8 vertebra, subsequent encounter for fracture with routine healing: Secondary | ICD-10-CM | POA: Diagnosis not present

## 2022-03-12 DIAGNOSIS — C7951 Secondary malignant neoplasm of bone: Secondary | ICD-10-CM | POA: Diagnosis present

## 2022-03-12 DIAGNOSIS — I6503 Occlusion and stenosis of bilateral vertebral arteries: Secondary | ICD-10-CM | POA: Diagnosis not present

## 2022-03-12 DIAGNOSIS — M4854XA Collapsed vertebra, not elsewhere classified, thoracic region, initial encounter for fracture: Secondary | ICD-10-CM | POA: Diagnosis not present

## 2022-03-12 LAB — RESP PANEL BY RT-PCR (FLU A&B, COVID) ARPGX2
Influenza A by PCR: NEGATIVE
Influenza B by PCR: NEGATIVE
SARS Coronavirus 2 by RT PCR: NEGATIVE

## 2022-03-12 LAB — CBC
HCT: 41.6 % (ref 36.0–46.0)
Hemoglobin: 13.3 g/dL (ref 12.0–15.0)
MCH: 28.5 pg (ref 26.0–34.0)
MCHC: 32 g/dL (ref 30.0–36.0)
MCV: 89.3 fL (ref 80.0–100.0)
Platelets: 177 10*3/uL (ref 150–400)
RBC: 4.66 MIL/uL (ref 3.87–5.11)
RDW: 13.6 % (ref 11.5–15.5)
WBC: 10 10*3/uL (ref 4.0–10.5)
nRBC: 0 % (ref 0.0–0.2)

## 2022-03-12 LAB — BASIC METABOLIC PANEL
Anion gap: 9 (ref 5–15)
BUN: 18 mg/dL (ref 8–23)
CO2: 26 mmol/L (ref 22–32)
Calcium: 9.9 mg/dL (ref 8.9–10.3)
Chloride: 103 mmol/L (ref 98–111)
Creatinine, Ser: 1 mg/dL (ref 0.44–1.00)
GFR, Estimated: 59 mL/min — ABNORMAL LOW (ref >60)
Glucose, Bld: 127 mg/dL — ABNORMAL HIGH (ref 70–99)
Potassium: 3.5 mmol/L (ref 3.5–5.1)
Sodium: 138 mmol/L (ref 135–145)

## 2022-03-12 LAB — LACTIC ACID, PLASMA: Lactic Acid, Venous: 1.8 mmol/L (ref 0.5–1.9)

## 2022-03-12 MED ORDER — ASPIRIN 81 MG PO CHEW
324.0000 mg | CHEWABLE_TABLET | Freq: Once | ORAL | Status: AC
Start: 2022-03-12 — End: 2022-03-12
  Administered 2022-03-12: 324 mg via ORAL
  Filled 2022-03-12: qty 4

## 2022-03-12 NOTE — ED Triage Notes (Signed)
Pt presents to ER with husband c/o decreased mobility that has been getting worse for several months, but tonight, pt states she is unable to walk or move.  Pts legs are able to move, but states today she cannot walk and had to use one of her lift chairs.  Pt is being treated for small cell lung carcinoma.  Pt states last chemo tx was Monday this week.  Pt is A&O x4 at this time in NAD in triage, speaking clearly and able to move all extremities.   ?

## 2022-03-12 NOTE — ED Notes (Signed)
Attempted bedpan for urine collection. Pt unable to void stating she had prior to arrival. Refuses in and out catheter.  ?

## 2022-03-12 NOTE — ED Provider Notes (Signed)
? ?[facility] ?Provider Note ? ? ? Event Date/Time  ? First MD Initiated Contact with Patient 03/12/22 2028   ?  (approximate) ? ? ?History  ? ?Chief Complaint ?Weakness ? ? ?HPI ? ?Kristi [name] is a 76 y.o. female with past medical history of hypertension, hyperlipidemia, CAD, CHF, and small cell lung cancer who presents to the ED complaining of weakness.  Patient reports that she has dealt with a few months of weakness and difficulty with balance in her bilateral lower extremities, right greater than left.  This seemed to acutely worsen over the past 24 hours, making it difficult for her to walk at all.  She was previously able to get around with assistance, but has not been able to do so since the symptoms worsened.  She denies any weakness in her upper extremities, vision changes, speech changes, or facial droop. ?  ? ? ?Physical Exam  ? ?Triage Vital Signs: ?ED Triage Vitals [03/12/22 1955]  ?Enc Vitals Group  ?   BP 102/82  ?   Pulse Rate (!) 114  ?   Resp (!) 22  ?   Temp 97.8 ?F (36.6 ?C)  ?   Temp Source Oral  ?   SpO2 92 %  ?   Weight 144 lb (65.3 kg)  ?   Height 5\' 7"  (1.702 m)  ?   Head Circumference   ?   Peak Flow   ?   Pain Score 6  ?   Pain Loc   ?   Pain Edu?   ?   Excl. in [place]?   ? ? ?Most recent vital signs: ?Vitals:  ? 03/12/22 2037 03/12/22 2130  ?BP: (!) 112/59 116/68  ?Pulse: (!) 102 88  ?Resp: (!) 22 20  ?Temp:    ?SpO2: 98% 95%  ? ? ?Constitutional: Alert and oriented. ?Eyes: Conjunctivae are normal. ?Head: Atraumatic. ?Nose: No congestion/rhinnorhea. ?Mouth/Throat: Mucous membranes are moist.  ?Cardiovascular: Normal rate, regular rhythm. Grossly normal heart sounds.  2+ radial pulses bilaterally. ?Respiratory: Normal respiratory effort.  No retractions. Lungs CTAB. ?Gastrointestinal: Soft and nontender. No distention. ?Musculoskeletal: No lower extremity tenderness nor edema.  ?Neurologic:  Normal speech and language.  3 out of 5 strength in right lower extremity,  4 out of 5 strength in left lower extremity.  Heel-to-shin testing with dysmetria bilaterally.  5 out of 5 strength in bilateral upper extremities with no pronator drift. ? ? ? ?ED Results / Procedures / Treatments  ? ?Labs ?(all labs ordered are listed, but only abnormal results are displayed) ?Labs Reviewed  ?BASIC METABOLIC PANEL - Abnormal; Notable for the following components:  ?    Result Value  ? Glucose, Bld 127 (*)   ? GFR, Estimated 59 (*)   ? All other components within normal limits  ?CBC  ?LACTIC ACID, PLASMA  ?URINALYSIS, ROUTINE W REFLEX MICROSCOPIC  ?LACTIC ACID, PLASMA  ? ? ? ?EKG ? ?ED ECG REPORT ?Tempie Hoist, the attending physician, personally viewed and interpreted this ECG. ? ? Date: 03/12/2022 ? EKG Time: 20:04 ? Rate: 117 ? Rhythm: sinus tachycardia ? Axis: Normal ? Intervals:none ? ST&T Change: None ? ?RADIOLOGY ?CT head reviewed by me with no obvious hemorrhage or midline shift. ? ?PROCEDURES: ? ?Critical Care performed: No ? ?Procedures ? ? ?MEDICATIONS ORDERED IN ED: ?Medications  ?aspirin chewable tablet 324 mg (has no administration in time range)  ? ? ? ?IMPRESSION / MDM / ASSESSMENT AND PLAN /  ED COURSE  ?I reviewed the triage vital signs and the nursing notes. ?             ?               ? ?76 y.o. female with past medical history of hypertension, hyperlipidemia, CAD, CHF, and small cell lung cancer who presents to the ED complaining of bilateral lower extremity weakness and difficulty ambulating over the past couple of months that got acutely worse over the past 24 hours. ? ?Differential diagnosis includes, but is not limited to, stroke, other intracranial process, spinal lesion, dehydration, electrolyte abnormality, UTI. ? ?Patient nontoxic-appearing and in no acute distress, vital signs remarkable only for tachycardia.  On neurologic exam, patient with weakness in her bilateral lower extremities along with difficulties with coordination seen on heel-to-shin testing.  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  We will further assess with CT head, labs thus far are reassuring with CBC showing no anemia and BMP without AKI or electrolyte abnormality.  Low suspicion for infectious process at this time. ? 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  Case discussed with hospitalist for admission. ? ?  ? ? ?FINAL CLINICAL IMPRESSION(S) / ED DIAGNOSES  ? ?Final diagnoses:  ?Gait difficulty  ?Weakness of both lower extremities  ? ? ? ?Rx / DC Orders  ? ?ED Discharge Orders   ? ? None  ? ?  ? ? ? ?Note:  This document was prepared using Dragon voice recognition software and may include unintentional dictation errors. ?  ?Blake Divine, MD ?03/12/22 2150 ? ?

## 2022-03-12 NOTE — H&P (Signed)
?History and Physical  ? ? ?Patient: Kristi [name] MRN: [id number] DOA: 03/12/2022 ? ?Date of Service: the patient was seen and examined on 03/13/2022 ? ?Patient coming from: Home ? ?Chief Complaint:  ?Chief Complaint  ?Patient presents with  ? Weakness  ? ? ?HPI:  ? ?76 year old female with past medical history of coronary artery disease (S/P stents in 7846), diastolic congestive heart failure (Echo 12/2021 EF 60-65% with G1DD), hyperlipidemia,  hyperlipidemia, hypertension, meatastatic small cell lung cancer (Dx 10/2018, metastatic to bone, follows with Dr. Tasia Catchings), GERD presenting with progressive lower extremity weakness and difficulty with ambulation. ? ?Patient complains of progressively worsening difficulty with ambulation and bilateral lower extremity weakness since at least mid 2022.  Symptoms have gradually worsened since.  Patient denies facial droop, change in vision, slurred speech. ? ?Patient symptoms continue to worsen and patient underwent outpatient MRI in 10/2021 to have multiple new enhancing lesions in the cerebellum.  This was initially thought to be metastatic disease but later after much evaluation by the tumor board was then felt to be secondary to multiple subacute strokes.  Patient has been taking aspirin therapy since and was to follow-up as an outpatient with neurology. ? ?While patient has gradually worsened over the past several months, patient reports that in the past 24 hours her symptoms have become profoundly worse.  Patient also complains of associated low back pain, midline in location, sharp in quality and severe in intensity that has been ongoing over the same span of time.  Patient complains of severe bilateral lower extremity weakness, right greater than left.  Patient states that she is now unable to walk at all where she was previously able to get around with an assistive device or with assistance.  These worsening symptoms prompted the patient to present to [facility] emergency  department with her husband for further evaluation. ? 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  ER provider is concerned about recurrent stroke or metastatic disease to the brain and is requesting the hospitalist group evaluate the patient for admission to the hospital. ? ? ? ?Review of Systems: Review of Systems  ?Neurological:  Positive for weakness.  ? ? ?Past Medical History:  ?Diagnosis Date  ? Acute MI, inferoposterior wall ([place]) 09/30/2014  ? 1 stent  ? Claustrophobia   ? Coronary artery disease   ? GERD (gastroesophageal reflux disease)   ? Hypercholesteremia   ? MI, old   ? Pneumonia   ? Small cell lung cancer ([place])   ? 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 [facility]) 10/27/2018  ? ? ?Past Surgical History:  ?Procedure Laterality Date  ? APPENDECTOMY    ? 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  ? BLADDER NECK SUSPENSION    ? CHOLECYSTECTOMY N/A 08/14/2018  ? Procedure: LAPAROSCOPIC CHOLECYSTECTOMY;  Surgeon: Jules Husbands, MD;  Location: [facility] ORS;  Service: General;  Laterality: N/A;  ? CHOLECYSTECTOMY  11/2018  ? CORONARY ANGIOPLASTY  09/29/2014  ? CORONARY STENT PLACEMENT    ? ENDOBRONCHIAL ULTRASOUND N/A 10/21/2018  ? Procedure: ENDOBRONCHIAL ULTRASOUND;  Surgeon: Laverle Hobby, MD;  Location: [facility] ORS;  Service: Pulmonary;  Laterality: N/A;  ? HERNIA REPAIR  2012  ? IR FLUORO GUIDE CV LINE RIGHT  12/19/2018  ? KYPHOPLASTY N/A 03/01/2020  ? Procedure: T7 KYPHOPLASTY;  Surgeon: Hessie Knows, MD;  Location: [facility] ORS;  Service: Orthopedics;  Laterality: N/A;  ?  LAPAROTOMY    ? LEFT HEART CATHETERIZATION WITH CORONARY ANGIOGRAM N/A 09/29/2014  ? Procedure: LEFT HEART CATHETERIZATION WITH CORONARY ANGIOGRAM;  Surgeon: Clent Demark, MD;  Location: [facility] CATH LAB;  Service: Cardiovascular;  Laterality: N/A;  ? PORTA CATH INSERTION N/A 01/02/2019  ? Procedure: PORTA CATH INSERTION;  Surgeon: Algernon Huxley, MD;  Location: [place] CV LAB;  Service: Cardiovascular;  Laterality: N/A;  ? PORTACATH PLACEMENT Right 10/28/2018  ? Procedure: INSERTION PORT-A-CATH;  Surgeon: Jules Husbands, MD;  Location: [facility] ORS;  Service: General;  Laterality: Right;  ? XI ROBOTIC ASSISTED VENTRAL HERNIA N/A 02/27/2021  ? Procedure: XI ROBOTIC ASSISTED VENTRAL HERNIA (incisional) WITH MESH;  Surgeon: Olean Ree, MD;  Location: [facility] ORS;  Service: General;  Laterality: N/A;  ? ? ?Social History:  reports that she quit smoking about 3 years ago. Her smoking use included cigarettes. She started smoking about 43 years ago. She has a 39.00 pack-year smoking history. She has never used smokeless tobacco. She reports current alcohol use. She reports that she does not use drugs. ? ?No Known Allergies ? ?Family History  ?Problem Relation Age of Onset  ? Alzheimer's disease Mother   ? Colon cancer Father   ? Breast cancer Neg Hx   ? ? ?Prior to Admission medications   ?Medication Sig Start Date End Date Taking? Authorizing Provider  ?acetaminophen (TYLENOL) 500 MG tablet Take 2 tablets (1,000 mg total) by mouth every 6 (six) hours as needed for mild pain or headache. 02/27/21  Yes Piscoya, Jose, MD  ?aspirin EC 325 MG EC tablet Take 1 tablet (325 mg total) by mouth daily. Swallow whole. 12/19/21  Yes Vaslow, Acey Lav, MD  ?atorvastatin (LIPITOR) 40 MG tablet Take 1 tablet (40 mg total) by mouth daily. 12/19/21  Yes Vaslow, Acey Lav, MD  ?Calcium 600-200 MG-UNIT tablet Take 1 tablet by mouth 2 (two) times daily.   Yes [provider]  ?Cholecalciferol (DIALYVITE VITAMIN D 5000) 125 MCG (5000 UT) capsule Take 5,000 Units by mouth daily.   Yes [provider]  ?lidocaine-prilocaine (EMLA) cream Apply 1 application topically daily as needed (port access). 04/15/20  Yes Borders, Kirt Boys, NP  ?Magnesium 250 MG TABS Take 250 mg by mouth daily.   Yes [provider]  ?ondansetron (ZOFRAN) 8 MG tablet Take 1 tablet  (8 mg total) by mouth every 8 (eight) hours as needed for nausea or vomiting. 12/01/21  Yes Earlie Server, MD  ?oxyCODONE (ROXICODONE) 5 MG immediate release tablet Take 1 tablet (5 mg total) by mouth every 12 (twelve) hours as needed for severe pain. 10/20/21 10/20/22 Yes Earlie Server, MD  ?pantoprazole (PROTONIX) 40 MG tablet Take 1 tablet (40 mg total) by mouth 2 (two) times daily. 04/02/21  Yes Earlie Server, MD  ?prochlorperazine (COMPAZINE) 10 MG tablet Take 10 mg by mouth every 6 (six) hours as needed for nausea or vomiting.   Yes [provider]  ?sertraline (ZOLOFT) 100 MG tablet Take 1 tablet (100 mg total) by mouth daily. 03/31/21  Yes Borders, Kirt Boys, NP  ?sucralfate (CARAFATE) 1 g tablet Take 1 g by mouth 3 (three) times daily as needed (heartburn).   Yes [provider]  ?traZODone (DESYREL) 50 MG tablet Take 1 tablet (50 mg total) by mouth at bedtime as needed for sleep. 03/31/21  Yes Borders, Kirt Boys, NP  ?vitamin B-12 (CYANOCOBALAMIN) 1000 MCG tablet Take 1,000 mcg by mouth daily.   Yes [provider]  ?  dexamethasone (DECADRON) 2 MG tablet Take 2 tablets (4 mg total) by mouth 2 (two) times daily. 4 mg BID x 3 days, 4 mg daily x3 days, 2 mg daily x days. ?Patient not taking: Reported on 01/26/2022 11/24/21   [name], MD  ? ? ?Physical Exam: ? ?Vitals:  ? 03/12/22 2037 03/12/22 2130 03/12/22 2247 03/13/22 0420  ?BP: (!) 112/59 116/68 109/69 105/74  ?Pulse: (!) 102 88 88 79  ?Resp: (!) 22 20 20 18   ?Temp:   99 ?F (37.2 ?C) 98 ?F (36.7 ?C)  ?TempSrc:    Oral  ?SpO2: 98% 95% 92% 94%  ?Weight:      ?Height:      ? ? ?Constitutional: Awake alert and oriented x3, no associated distress.   ?Skin: no rashes, no lesions, good skin turgor noted. ?Eyes: Pupils are equally reactive to light.  No evidence of scleral icterus or conjunctival pallor.  ?ENMT: Moist mucous membranes noted.  Posterior pharynx clear of any exudate or lesions.   ?Neck: normal, supple, no masses, no thyromegaly.  No  evidence of jugular venous distension.   ?Respiratory: Scattered rhonchi bilaterally.  No evidence of wheezing or rales.  No accessory muscle use.  ?Cardiovascular: Regular rate and rhythm, no murmurs / rubs /

## 2022-03-13 ENCOUNTER — Observation Stay: Payer: Medicare Other

## 2022-03-13 ENCOUNTER — Inpatient Hospital Stay: Payer: Medicare Other

## 2022-03-13 DIAGNOSIS — M5030 Other cervical disc degeneration, unspecified cervical region: Secondary | ICD-10-CM | POA: Diagnosis not present

## 2022-03-13 DIAGNOSIS — I2699 Other pulmonary embolism without acute cor pulmonale: Secondary | ICD-10-CM | POA: Diagnosis present

## 2022-03-13 DIAGNOSIS — M2578 Osteophyte, vertebrae: Secondary | ICD-10-CM | POA: Diagnosis present

## 2022-03-13 DIAGNOSIS — R29898 Other symptoms and signs involving the musculoskeletal system: Secondary | ICD-10-CM | POA: Diagnosis not present

## 2022-03-13 DIAGNOSIS — S22069A Unspecified fracture of T7-T8 vertebra, initial encounter for closed fracture: Secondary | ICD-10-CM | POA: Diagnosis present

## 2022-03-13 DIAGNOSIS — I5032 Chronic diastolic (congestive) heart failure: Secondary | ICD-10-CM | POA: Diagnosis present

## 2022-03-13 DIAGNOSIS — C7951 Secondary malignant neoplasm of bone: Secondary | ICD-10-CM | POA: Diagnosis present

## 2022-03-13 DIAGNOSIS — K219 Gastro-esophageal reflux disease without esophagitis: Secondary | ICD-10-CM | POA: Diagnosis present

## 2022-03-13 DIAGNOSIS — E785 Hyperlipidemia, unspecified: Secondary | ICD-10-CM | POA: Diagnosis not present

## 2022-03-13 DIAGNOSIS — E440 Moderate protein-calorie malnutrition: Secondary | ICD-10-CM | POA: Diagnosis present

## 2022-03-13 DIAGNOSIS — I6503 Occlusion and stenosis of bilateral vertebral arteries: Secondary | ICD-10-CM | POA: Diagnosis not present

## 2022-03-13 DIAGNOSIS — C3402 Malignant neoplasm of left main bronchus: Secondary | ICD-10-CM | POA: Diagnosis present

## 2022-03-13 DIAGNOSIS — Z87891 Personal history of nicotine dependence: Secondary | ICD-10-CM | POA: Diagnosis not present

## 2022-03-13 DIAGNOSIS — M6281 Muscle weakness (generalized): Secondary | ICD-10-CM | POA: Diagnosis not present

## 2022-03-13 DIAGNOSIS — Z79899 Other long term (current) drug therapy: Secondary | ICD-10-CM | POA: Diagnosis not present

## 2022-03-13 DIAGNOSIS — C3490 Malignant neoplasm of unspecified part of unspecified bronchus or lung: Secondary | ICD-10-CM | POA: Diagnosis not present

## 2022-03-13 DIAGNOSIS — G479 Sleep disorder, unspecified: Secondary | ICD-10-CM | POA: Diagnosis not present

## 2022-03-13 DIAGNOSIS — Z736 Limitation of activities due to disability: Secondary | ICD-10-CM | POA: Diagnosis not present

## 2022-03-13 DIAGNOSIS — J99 Respiratory disorders in diseases classified elsewhere: Secondary | ICD-10-CM | POA: Diagnosis not present

## 2022-03-13 DIAGNOSIS — R488 Other symbolic dysfunctions: Secondary | ICD-10-CM | POA: Diagnosis not present

## 2022-03-13 DIAGNOSIS — I5033 Acute on chronic diastolic (congestive) heart failure: Secondary | ICD-10-CM | POA: Diagnosis not present

## 2022-03-13 DIAGNOSIS — I6523 Occlusion and stenosis of bilateral carotid arteries: Secondary | ICD-10-CM | POA: Diagnosis not present

## 2022-03-13 DIAGNOSIS — R2681 Unsteadiness on feet: Secondary | ICD-10-CM | POA: Diagnosis not present

## 2022-03-13 DIAGNOSIS — G4700 Insomnia, unspecified: Secondary | ICD-10-CM | POA: Diagnosis not present

## 2022-03-13 DIAGNOSIS — I5189 Other ill-defined heart diseases: Secondary | ICD-10-CM | POA: Diagnosis not present

## 2022-03-13 DIAGNOSIS — R262 Difficulty in walking, not elsewhere classified: Secondary | ICD-10-CM | POA: Diagnosis present

## 2022-03-13 DIAGNOSIS — E569 Vitamin deficiency, unspecified: Secondary | ICD-10-CM | POA: Diagnosis not present

## 2022-03-13 DIAGNOSIS — I639 Cerebral infarction, unspecified: Secondary | ICD-10-CM | POA: Diagnosis not present

## 2022-03-13 DIAGNOSIS — W19XXXS Unspecified fall, sequela: Secondary | ICD-10-CM | POA: Diagnosis present

## 2022-03-13 DIAGNOSIS — F32A Depression, unspecified: Secondary | ICD-10-CM | POA: Diagnosis not present

## 2022-03-13 DIAGNOSIS — Z20822 Contact with and (suspected) exposure to covid-19: Secondary | ICD-10-CM | POA: Diagnosis present

## 2022-03-13 DIAGNOSIS — M545 Low back pain, unspecified: Secondary | ICD-10-CM | POA: Diagnosis present

## 2022-03-13 DIAGNOSIS — M4854XS Collapsed vertebra, not elsewhere classified, thoracic region, sequela of fracture: Secondary | ICD-10-CM | POA: Diagnosis present

## 2022-03-13 DIAGNOSIS — Z8669 Personal history of other diseases of the nervous system and sense organs: Secondary | ICD-10-CM | POA: Diagnosis not present

## 2022-03-13 DIAGNOSIS — Z6822 Body mass index (BMI) 22.0-22.9, adult: Secondary | ICD-10-CM | POA: Diagnosis not present

## 2022-03-13 DIAGNOSIS — I110 Hypertensive heart disease with heart failure: Secondary | ICD-10-CM | POA: Diagnosis present

## 2022-03-13 DIAGNOSIS — M4802 Spinal stenosis, cervical region: Secondary | ICD-10-CM | POA: Diagnosis present

## 2022-03-13 DIAGNOSIS — E782 Mixed hyperlipidemia: Secondary | ICD-10-CM | POA: Diagnosis present

## 2022-03-13 DIAGNOSIS — I959 Hypotension, unspecified: Secondary | ICD-10-CM | POA: Diagnosis not present

## 2022-03-13 DIAGNOSIS — S22069D Unspecified fracture of T7-T8 vertebra, subsequent encounter for fracture with routine healing: Secondary | ICD-10-CM | POA: Diagnosis not present

## 2022-03-13 DIAGNOSIS — M6259 Muscle wasting and atrophy, not elsewhere classified, multiple sites: Secondary | ICD-10-CM | POA: Diagnosis not present

## 2022-03-13 DIAGNOSIS — R258 Other abnormal involuntary movements: Secondary | ICD-10-CM | POA: Diagnosis present

## 2022-03-13 DIAGNOSIS — R5383 Other fatigue: Secondary | ICD-10-CM | POA: Diagnosis not present

## 2022-03-13 DIAGNOSIS — I771 Stricture of artery: Secondary | ICD-10-CM | POA: Diagnosis not present

## 2022-03-13 DIAGNOSIS — I2510 Atherosclerotic heart disease of native coronary artery without angina pectoris: Secondary | ICD-10-CM | POA: Diagnosis present

## 2022-03-13 DIAGNOSIS — Z86711 Personal history of pulmonary embolism: Secondary | ICD-10-CM | POA: Diagnosis not present

## 2022-03-13 DIAGNOSIS — Z955 Presence of coronary angioplasty implant and graft: Secondary | ICD-10-CM | POA: Diagnosis not present

## 2022-03-13 DIAGNOSIS — M488X5 Other specified spondylopathies, thoracolumbar region: Secondary | ICD-10-CM | POA: Diagnosis not present

## 2022-03-13 DIAGNOSIS — R29818 Other symptoms and signs involving the nervous system: Secondary | ICD-10-CM | POA: Diagnosis not present

## 2022-03-13 DIAGNOSIS — Z8673 Personal history of transient ischemic attack (TIA), and cerebral infarction without residual deficits: Secondary | ICD-10-CM

## 2022-03-13 DIAGNOSIS — M5134 Other intervertebral disc degeneration, thoracic region: Secondary | ICD-10-CM | POA: Diagnosis present

## 2022-03-13 DIAGNOSIS — M5124 Other intervertebral disc displacement, thoracic region: Secondary | ICD-10-CM | POA: Diagnosis not present

## 2022-03-13 DIAGNOSIS — R498 Other voice and resonance disorders: Secondary | ICD-10-CM | POA: Diagnosis not present

## 2022-03-13 DIAGNOSIS — I10 Essential (primary) hypertension: Secondary | ICD-10-CM | POA: Diagnosis not present

## 2022-03-13 DIAGNOSIS — M4854XA Collapsed vertebra, not elsewhere classified, thoracic region, initial encounter for fracture: Secondary | ICD-10-CM | POA: Diagnosis not present

## 2022-03-13 DIAGNOSIS — M5126 Other intervertebral disc displacement, lumbar region: Secondary | ICD-10-CM | POA: Diagnosis not present

## 2022-03-13 DIAGNOSIS — G8929 Other chronic pain: Secondary | ICD-10-CM | POA: Diagnosis present

## 2022-03-13 LAB — URINALYSIS, ROUTINE W REFLEX MICROSCOPIC
Bilirubin Urine: NEGATIVE
Glucose, UA: NEGATIVE mg/dL
Ketones, ur: NEGATIVE mg/dL
Nitrite: NEGATIVE
Protein, ur: NEGATIVE mg/dL
Specific Gravity, Urine: 1.033 — ABNORMAL HIGH (ref 1.005–1.030)
pH: 6 (ref 5.0–8.0)

## 2022-03-13 LAB — CBC WITH DIFFERENTIAL/PLATELET
Abs Immature Granulocytes: 0.07 10*3/uL (ref 0.00–0.07)
Basophils Absolute: 0.1 10*3/uL (ref 0.0–0.1)
Basophils Relative: 1 %
Eosinophils Absolute: 0.3 10*3/uL (ref 0.0–0.5)
Eosinophils Relative: 3 %
HCT: 35.5 % — ABNORMAL LOW (ref 36.0–46.0)
Hemoglobin: 11.7 g/dL — ABNORMAL LOW (ref 12.0–15.0)
Immature Granulocytes: 1 %
Lymphocytes Relative: 16 %
Lymphs Abs: 1.3 10*3/uL (ref 0.7–4.0)
MCH: 29 pg (ref 26.0–34.0)
MCHC: 33 g/dL (ref 30.0–36.0)
MCV: 87.9 fL (ref 80.0–100.0)
Monocytes Absolute: 1.1 10*3/uL — ABNORMAL HIGH (ref 0.1–1.0)
Monocytes Relative: 14 %
Neutro Abs: 5.3 10*3/uL (ref 1.7–7.7)
Neutrophils Relative %: 65 %
Platelets: 164 10*3/uL (ref 150–400)
RBC: 4.04 MIL/uL (ref 3.87–5.11)
RDW: 13.3 % (ref 11.5–15.5)
WBC: 8.1 10*3/uL (ref 4.0–10.5)
nRBC: 0 % (ref 0.0–0.2)

## 2022-03-13 LAB — COMPREHENSIVE METABOLIC PANEL
ALT: 40 U/L (ref 0–44)
AST: 45 U/L — ABNORMAL HIGH (ref 15–41)
Albumin: 3.8 g/dL (ref 3.5–5.0)
Alkaline Phosphatase: 73 U/L (ref 38–126)
Anion gap: 12 (ref 5–15)
BUN: 16 mg/dL (ref 8–23)
CO2: 23 mmol/L (ref 22–32)
Calcium: 9 mg/dL (ref 8.9–10.3)
Chloride: 99 mmol/L (ref 98–111)
Creatinine, Ser: 0.85 mg/dL (ref 0.44–1.00)
GFR, Estimated: >60 mL/min (ref >60)
Glucose, Bld: 104 mg/dL — ABNORMAL HIGH (ref 70–99)
Potassium: 3.5 mmol/L (ref 3.5–5.1)
Sodium: 134 mmol/L — ABNORMAL LOW (ref 135–145)
Total Bilirubin: 0.7 mg/dL (ref 0.3–1.2)
Total Protein: 7.5 g/dL (ref 6.5–8.1)

## 2022-03-13 LAB — HEMOGLOBIN A1C
Hgb A1c MFr Bld: 4.9 % (ref 4.8–5.6)
Mean Plasma Glucose: 93.93 mg/dL

## 2022-03-13 LAB — PROTIME-INR
INR: 1 (ref 0.8–1.2)
Prothrombin Time: 13.3 seconds (ref 11.4–15.2)

## 2022-03-13 LAB — HEPARIN LEVEL (UNFRACTIONATED): Heparin Unfractionated: 0.83 IU/mL — ABNORMAL HIGH (ref 0.30–0.70)

## 2022-03-13 LAB — CK: Total CK: 117 U/L (ref 38–234)

## 2022-03-13 LAB — FOLATE: Folate: 11.3 ng/mL (ref >5.9)

## 2022-03-13 LAB — MAGNESIUM: Magnesium: 1.8 mg/dL (ref 1.7–2.4)

## 2022-03-13 LAB — LIPID PANEL
Cholesterol: 153 mg/dL (ref 0–200)
HDL: 66 mg/dL (ref >40)
LDL Cholesterol: 76 mg/dL (ref 0–99)
Total CHOL/HDL Ratio: 2.3 RATIO
Triglycerides: 57 mg/dL (ref <150)
VLDL: 11 mg/dL (ref 0–40)

## 2022-03-13 LAB — VITAMIN B12: Vitamin B-12: 5206 pg/mL — ABNORMAL HIGH (ref 180–914)

## 2022-03-13 LAB — APTT: aPTT: 60 seconds — ABNORMAL HIGH (ref 24–36)

## 2022-03-13 MED ORDER — IOHEXOL 350 MG/ML SOLN
75.0000 mL | Freq: Once | INTRAVENOUS | Status: AC | PRN
  Administered 2022-03-13: 75 mL via INTRAVENOUS

## 2022-03-13 MED ORDER — PROCHLORPERAZINE MALEATE 10 MG PO TABS
10.0000 mg | ORAL_TABLET | Freq: Four times a day (QID) | ORAL | Status: DC | PRN
  Filled 2022-03-13: qty 1

## 2022-03-13 MED ORDER — TRAZODONE HCL 50 MG PO TABS
50.0000 mg | ORAL_TABLET | Freq: Every evening | ORAL | Status: DC | PRN
  Administered 2022-03-15 – 2022-03-17 (×2): 50 mg via ORAL
  Filled 2022-03-13 (×2): qty 1

## 2022-03-13 MED ORDER — ASPIRIN EC 325 MG PO TBEC
325.0000 mg | DELAYED_RELEASE_TABLET | Freq: Every day | ORAL | Status: DC
  Administered 2022-03-13: 325 mg via ORAL
  Filled 2022-03-13: qty 1

## 2022-03-13 MED ORDER — HEPARIN BOLUS VIA INFUSION
4500.0000 [IU] | Freq: Once | INTRAVENOUS | Status: AC
  Administered 2022-03-13: 4500 [IU] via INTRAVENOUS
  Filled 2022-03-13: qty 4500

## 2022-03-13 MED ORDER — STROKE: EARLY STAGES OF RECOVERY BOOK: Freq: Once | Status: AC

## 2022-03-13 MED ORDER — SERTRALINE HCL 50 MG PO TABS
100.0000 mg | ORAL_TABLET | Freq: Every day | ORAL | Status: DC
Start: 2022-03-13 — End: 2022-03-18
  Administered 2022-03-13 – 2022-03-18 (×6): 100 mg via ORAL
  Filled 2022-03-13 (×6): qty 2

## 2022-03-13 MED ORDER — GADOBUTROL 1 MMOL/ML IV SOLN
7.0000 mL | Freq: Once | INTRAVENOUS | Status: AC | PRN
  Administered 2022-03-13: 7 mL via INTRAVENOUS

## 2022-03-13 MED ORDER — ADULT MULTIVITAMIN W/MINERALS CH
1.0000 | ORAL_TABLET | Freq: Every day | ORAL | Status: DC
  Administered 2022-03-13 – 2022-03-18 (×6): 1 via ORAL
  Filled 2022-03-13 (×6): qty 1

## 2022-03-13 MED ORDER — ACETAMINOPHEN 650 MG RE SUPP: 650.0000 mg | Freq: Four times a day (QID) | RECTAL | Status: DC | PRN

## 2022-03-13 MED ORDER — SUCRALFATE 1 G PO TABS: 1.0000 g | ORAL_TABLET | Freq: Three times a day (TID) | ORAL | Status: DC | PRN

## 2022-03-13 MED ORDER — ATORVASTATIN CALCIUM 20 MG PO TABS
80.0000 mg | ORAL_TABLET | Freq: Every day | ORAL | Status: DC
  Administered 2022-03-14 – 2022-03-18 (×5): 80 mg via ORAL
  Filled 2022-03-13 (×5): qty 4

## 2022-03-13 MED ORDER — OXYCODONE HCL 5 MG PO TABS
5.0000 mg | ORAL_TABLET | Freq: Two times a day (BID) | ORAL | Status: DC | PRN
  Administered 2022-03-13 – 2022-03-17 (×5): 5 mg via ORAL
  Filled 2022-03-13 (×5): qty 1

## 2022-03-13 MED ORDER — GADOBUTROL 1 MMOL/ML IV SOLN
5.0000 mL | Freq: Once | INTRAVENOUS | Status: AC | PRN
  Administered 2022-03-13: 5 mL via INTRAVENOUS

## 2022-03-13 MED ORDER — VITAMIN B-12 1000 MCG PO TABS
1000.0000 ug | ORAL_TABLET | Freq: Every day | ORAL | Status: DC
  Administered 2022-03-13 – 2022-03-15 (×3): 1000 ug via ORAL
  Filled 2022-03-13 (×3): qty 1

## 2022-03-13 MED ORDER — ENOXAPARIN SODIUM 40 MG/0.4ML IJ SOSY: 40.0000 mg | PREFILLED_SYRINGE | INTRAMUSCULAR | Status: DC

## 2022-03-13 MED ORDER — HEPARIN (PORCINE) 25000 UT/250ML-% IV SOLN
900.0000 [IU]/h | INTRAVENOUS | Status: AC
  Administered 2022-03-13: 1050 [IU]/h via INTRAVENOUS
  Administered 2022-03-14 – 2022-03-15 (×2): 900 [IU]/h via INTRAVENOUS
  Filled 2022-03-13 (×3): qty 250

## 2022-03-13 MED ORDER — ONDANSETRON HCL 4 MG PO TABS: 4.0000 mg | ORAL_TABLET | Freq: Four times a day (QID) | ORAL | Status: DC | PRN

## 2022-03-13 MED ORDER — ONDANSETRON HCL 4 MG/2ML IJ SOLN: 4.0000 mg | Freq: Four times a day (QID) | INTRAMUSCULAR | Status: DC | PRN

## 2022-03-13 MED ORDER — ACETAMINOPHEN 325 MG PO TABS
650.0000 mg | ORAL_TABLET | Freq: Four times a day (QID) | ORAL | Status: DC | PRN
  Administered 2022-03-14: 650 mg via ORAL
  Filled 2022-03-13: qty 2

## 2022-03-13 MED ORDER — LIDOCAINE 5 % EX PTCH
1.0000 | MEDICATED_PATCH | CUTANEOUS | Status: DC
  Administered 2022-03-13 – 2022-03-17 (×5): 1 via TRANSDERMAL
  Filled 2022-03-13 (×6): qty 1

## 2022-03-13 MED ORDER — CHLORHEXIDINE GLUCONATE CLOTH 2 % EX PADS
6.0000 | MEDICATED_PAD | Freq: Every day | CUTANEOUS | Status: DC
  Administered 2022-03-13 – 2022-03-17 (×5): 6 via TOPICAL

## 2022-03-13 MED ORDER — POLYETHYLENE GLYCOL 3350 17 G PO PACK: 17.0000 g | PACK | Freq: Every day | ORAL | Status: DC | PRN

## 2022-03-13 MED ORDER — BOOST / RESOURCE BREEZE PO LIQD CUSTOM
1.0000 | Freq: Three times a day (TID) | ORAL | Status: DC
Start: 2022-03-13 — End: 2022-03-18
  Administered 2022-03-13 – 2022-03-17 (×5): 1 via ORAL

## 2022-03-13 MED ORDER — PANTOPRAZOLE SODIUM 40 MG PO TBEC
40.0000 mg | DELAYED_RELEASE_TABLET | Freq: Two times a day (BID) | ORAL | Status: DC
  Administered 2022-03-13 – 2022-03-18 (×11): 40 mg via ORAL
  Filled 2022-03-13 (×11): qty 1

## 2022-03-13 MED ORDER — ATORVASTATIN CALCIUM 20 MG PO TABS
40.0000 mg | ORAL_TABLET | Freq: Every day | ORAL | Status: DC
  Administered 2022-03-13: 40 mg via ORAL
  Filled 2022-03-13: qty 2

## 2022-03-13 NOTE — Assessment & Plan Note (Signed)
Blood pressure slightly soft.  Holding antihypertensives. ?

## 2022-03-13 NOTE — Progress Notes (Signed)
Initial Nutrition Assessment ? ?DOCUMENTATION CODES:  ? ?Non-severe (moderate) malnutrition in context of chronic illness ? ?INTERVENTION:  ? ?-Boost Breeze po TID, each supplement provides 250 kcal and 9 grams of protein  ?-MVI with minerals daily ?-Liberalize diet to regular ? ?NUTRITION DIAGNOSIS:  ? ?Moderate Malnutrition related to chronic illness (lung cancer) as evidenced by mild fat depletion, moderate fat depletion, mild muscle depletion, moderate muscle depletion. ? ?GOAL:  ? ?Patient will meet greater than or equal to 90% of their needs ? ?MONITOR:  ? ?PO intake, Supplement acceptance, Labs, Weight trends, Skin, I & O's ? ?REASON FOR ASSESSMENT:  ? ?Consult ?Assessment of nutrition requirement/status ? ?ASSESSMENT:  ? ?76 year old female with past medical history of coronary artery disease (S/P stents in 0277), diastolic congestive heart failure (Echo 12/2021 EF 60-65% with G1DD), hyperlipidemia,  hyperlipidemia, hypertension, meatastatic small cell lung cancer (Dx 10/2018, metastatic to bone, follows with Dr. Tasia Catchings), GERD presenting with progressive lower extremity weakness and difficulty with ambulation. ? ?Pt admitted with bilateral leg weakness and acute PE.   ? ?Reviewed I/O's: +154 ml x 24 hours ? ?UOP: 50 ml x 24 hours ? ?Spoke with pt and granddaughter at bedside. Granddaughter reports diet is very poor at home. Per pt, she generally consumes 2 meals per day, which consists of meat loaf or a sandwich. Observed meal tray of [place], which pt consumed only one bite.  ? ?Per granddaughter, pt is supposed to use a walker for ambulation, but hesitant to use it and has had recurrent falls at home.  ? ?Pt has tried Ensure in the past, but does not like it. She is amenable to try Boost Breeze. RD will also liberalize diet for increase variety of meal selections.  ? ?Pt reports wt stability. Her UBW is around 130-135#. Reviewed wt hx; pt has experienced a 4.4% wt loss over the past 3 months, which is not  significant for time frame.  ? ?Discussed importance of good meal and supplement intake to promote healing.  ? ?Medications reviewed and include vitamin B-12.  ? ?Labs reviewed: Na: 134.   ? ?NUTRITION - FOCUSED PHYSICAL EXAM: ? ?Flowsheet Row Most Recent Value  ?Orbital Region Moderate depletion  ?Upper Arm Region Moderate depletion  ?Thoracic and Lumbar Region No depletion  ?Buccal Region Mild depletion  ?Temple Region Moderate depletion  ?Clavicle Bone Region Mild depletion  ?Clavicle and Acromion Bone Region Mild depletion  ?Scapular Bone Region Mild depletion  ?Dorsal Hand Mild depletion  ?Patellar Region No depletion  ?Anterior Thigh Region No depletion  ?Posterior Calf Region No depletion  ?Edema (RD Assessment) Mild  ?Hair Reviewed  ?Eyes Reviewed  ?Mouth Reviewed  ?Skin Reviewed  ?Nails Reviewed  ? ?  ? ? ?Diet Order:   ?Diet Order   ? ?       ?  Diet regular Room service appropriate? Yes; Fluid consistency: Thin  Diet effective now       ?  ? ?  ?  ? ?  ? ? ?EDUCATION NEEDS:  ? ?Education needs have been addressed ? ?Skin:  Skin Assessment: Reviewed RN Assessment ? ?Last BM:  03/12/22 ? ?Height:  ? ?Ht Readings from Last 1 Encounters:  ?03/12/22 5\' 7"  (1.702 m)  ? ? ?Weight:  ? ?Wt Readings from Last 1 Encounters:  ?03/12/22 65.3 kg  ? ? ?Ideal Body Weight:  61.4 kg ? ?BMI:  Body mass index is 22.55 kg/m?. ? ?Estimated Nutritional Needs:  ? ?Kcal:  1950-2150 ? ?  Protein:  100-115 grams ? ?Fluid:  > 1.9 L ? ? ? ?Loistine Chance, RD, LDN, CDCES ?Registered Dietitian II ?Certified Diabetes Care and Education Specialist ?Please refer to [facility] for RD and/or RD on-call/weekend/after hours pager  ?

## 2022-03-13 NOTE — Evaluation (Signed)
Speech Language Pathology Evaluation ?Patient Details ?Name: Priscila Bean [facility] ?MRN: [id number] ?DOB: Feb 27, 1946 ?Today's Date: 03/13/2022 ?Time: 6734-1937 ?SLP Time Calculation (min) (ACUTE ONLY): 25 min ? ?Problem List:  ?Patient Active Problem List  ? Diagnosis Date Noted  ? Acute pulmonary embolism ([place]) 03/13/2022  ? Leg weakness, bilateral 03/12/2022  ? Coronary artery disease involving native coronary artery of native heart without angina pectoris 03/12/2022  ? Chronic diastolic CHF (congestive heart failure) ([place]) 03/12/2022  ? GERD without esophagitis 03/12/2022  ? Cerebrovascular accident (CVA) ([place]) 12/19/2021  ? Brain ischemia 09/29/2021  ? MCI (mild cognitive impairment) 06/19/2021  ? Aortic atherosclerosis ([place]) 06/18/2021  ? Incisional hernia, without obstruction or gangrene   ? Thrombocytopenia ([place]) 02/12/2021  ? 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 Diarrhea 12/05/2020  ? Osteopenia 08/12/2020  ? Ventral hernia without obstruction or gangrene 08/12/2020  ? Thoracic compression fracture, closed, initial encounter ([place]) 01/22/2020  ? Frequent falls 01/22/2020  ? Encounter for antineoplastic immunotherapy 09/14/2019  ? Memory loss 09/14/2019  ? Other fatigue 09/14/2019  ? Cancer of hilus of left lung ([place]) 06/08/2019  ? Dehydration 01/30/2019  ? Anemia due to antineoplastic chemotherapy 01/09/2019  ? Encounter for antineoplastic chemotherapy 11/02/2018  ? Cough 10/28/2018  ? Dysphagia 10/28/2018  ? Decrease in appetite 10/28/2018  ? Goals of care, counseling/discussion 10/28/2018  ? Small cell lung cancer ([place]) 10/27/2018  ? Grade I diastolic dysfunction 90/24/0973  ? Localized edema 08/29/2018  ? Cholecystitis 08/13/2018  ? Osteopenia determined by x-ray 05/19/2018  ? Prediabetes 02/08/2018  ? Hx of fracture of radius 08/18/2016  ? Vitamin D deficiency 01/07/2016  ? Mixed hyperlipidemia 01/02/2016  ? Essential hypertension 01/02/2016  ? Chronic pain 01/02/2016  ? Obesity (BMI 30.0-34.9)  01/02/2016  ? Coronary artery disease with stable angina pectoris ([place]) 01/02/2016  ? IBS (irritable bowel syndrome) 01/02/2016  ? FH: colon cancer 01/02/2016  ? DDD (degenerative disc disease), cervical 01/02/2016  ? Primary osteoarthritis of left knee 01/02/2016  ? Tobacco use disorder 01/02/2016  ? ?Past Medical History:  ?Past Medical History:  ?Diagnosis Date  ? Acute MI, inferoposterior wall ([place]) 09/30/2014  ? 1 stent  ? Claustrophobia   ? Coronary artery disease   ? GERD (gastroesophageal reflux disease)   ? Hypercholesteremia   ? MI, old   ? Pneumonia   ? Small cell lung cancer ([place])   ? 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 [facility]) 10/27/2018  ? ?Past Surgical History:  ?Past Surgical History:  ?Procedure Laterality Date  ? APPENDECTOMY    ? 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  ? BLADDER NECK SUSPENSION    ? CHOLECYSTECTOMY N/A 08/14/2018  ? Procedure: LAPAROSCOPIC CHOLECYSTECTOMY;  Surgeon: Jules Husbands, MD;  Location: [facility] ORS;  Service: General;  Laterality: N/A;  ? CHOLECYSTECTOMY  11/2018  ? CORONARY ANGIOPLASTY  09/29/2014  ? CORONARY STENT PLACEMENT    ? ENDOBRONCHIAL ULTRASOUND N/A 10/21/2018  ? Procedure: ENDOBRONCHIAL ULTRASOUND;  Surgeon: Laverle Hobby, MD;  Location: [facility] ORS;  Service: Pulmonary;  Laterality: N/A;  ? HERNIA REPAIR  2012  ? IR FLUORO GUIDE CV LINE RIGHT  12/19/2018  ? KYPHOPLASTY N/A 03/01/2020  ? Procedure: T7 KYPHOPLASTY;  Surgeon: Hessie Knows, MD;  Location: [facility] ORS;  Service: Orthopedics;  Laterality: N/A;  ? LAPAROTOMY    ? LEFT HEART CATHETERIZATION WITH CORONARY ANGIOGRAM N/A 09/29/2014  ? Procedure: LEFT HEART CATHETERIZATION WITH CORONARY ANGIOGRAM;  Surgeon: Clent Demark, MD;  Location: [facility] CATH LAB;  Service: Cardiovascular;  Laterality: N/A;  ? PORTA CATH INSERTION N/A 01/02/2019  ? Procedure: PORTA CATH INSERTION;  Surgeon: Algernon Huxley, MD;  Location: [place] CV LAB;  Service: Cardiovascular;  Laterality: N/A;  ? PORTACATH PLACEMENT Right 10/28/2018  ? Procedure:  INSERTION PORT-A-CATH;  Surgeon: Jules Husbands, MD;  Location: [facility] ORS;  Service: General;  Laterality: Right;  ? XI ROBOTIC ASSISTED VENTRAL HERNIA N/A 02/27/2021  ? Procedure: XI ROBOTIC ASSISTED VENTRAL HERNIA (incisional) WITH MESH;  Surgeon: Olean Ree, MD;  Location: [facility] ORS;  Service: General;  Laterality: N/A;  ? ?HPI:  ?76 year old female with past medical history of coronary artery disease (S/P stents in 8466), diastolic congestive heart failure (Echo 12/2021 EF 60-65% with G1DD), hyperlipidemia,  hyperlipidemia, hypertension, meatastatic small cell lung cancer (Dx 10/2018, metastatic to bone, follows with Dr. Tasia Catchings), GERD presenting with progressive lower extremity weakness and difficulty with ambulation. Patient complains of progressively worsening difficulty with ambulation and bilateral lower extremity weakness since at least mid 2022. Patient symptoms continue to worsen and patient underwent outpatient MRI in 10/2021 to have multiple new enhancing lesions in the cerebellum - felt to be multiple subacute strokes. MRI (03/13/2022) was negative for acute intracranial abnormality with old bilateral cerebellar infarcts and severe chronic microvascular disease. Chest Angio revealed Bilateral arterial emboli right-greater-than-left with overall moderate clot burden. No findings of acute right heart strain at this time. 2. Opacities in the posterior basal right lower lobe most likely due  to evolving infarctions. 3. COPD.  No suspicious nodule.  Old granulomatous calcifications.  ? ?Assessment / Plan / Recommendation ?Clinical Impression ? When meeting with pt and her husband, they both report that pt's memory has been declining for ~ 1 year.  Pt is currently oriented and demonstrates appropriate basic recall of events leading to current hospitalization as well as information related to ongoing cancer management. Her attention is intact and she was able to demonstrate basic problem solving. Pt was looking at  the menu and communicating her requests for lunch and supper without assistant. Full assessment of higher level cognitive deficits such as recalling information related to appts (time/date) are able to fully be assessed in this environment. Spoke with pt and her husband about following up as an Outpatient (or HHST) for complete cognitive linguistic evaluation to assess higher level memory and cognitive functioning. Written information was given to them on Outpatient Services should she be a candidate (PT/OT evaluations still pending). ?   ?SLP Assessment ? SLP Recommendation/Assessment: All further Speech Lanaguage Pathology  needs can be addressed in the next venue of care ?SLP Visit Diagnosis: Cognitive communication deficit (R41.841)  ?  ?Recommendations for follow up therapy are one component of a multi-disciplinary discharge planning process, led by the attending physician.  Recommendations may be updated based on patient status, additional functional criteria and insurance authorization. ?   ?Follow Up Recommendations ?  (Outpatient ST or HHST appropriate, PT/OT evaluations are pending)  ?  ?   ?   ?   ?   ?SLP Evaluation ?Cognition ? Overall Cognitive Status: Difficult to assess (in the acute setting pt appears functional especially with her husband's support)  ?  ?   ?Comprehension ? Visual Recognition/Discrimination ?Discrimination: Within Function Limits ?Reading Comprehension ?Reading Status: Within funtional limits  ?  ?Expression Expression ?Primary Mode of Expression: Verbal ?Verbal Expression ?Overall Verbal Expression: Appears within functional limits for tasks assessed   ?Oral / Motor ? Oral Motor/Sensory Function ?Overall Oral Motor/Sensory Function:  Within functional limits ?Motor Speech ?Overall Motor Speech: Appears within functional limits for tasks assessed   ?        ?Keval Nam B. Rutherford Nail, M.S., CCC-SLP, CBIS ?Speech-Language Pathologist ?Rehabilitation Services ?Office [phone] ? ?Anjelina Dung ?03/13/2022, 1:13 PM ? ?

## 2022-03-13 NOTE — Hospital Course (Addendum)
Patient is a 76 year old female with past medical history of diastolic heart failure, hypertension, metastatic small cell lung cancer with ongoing treatment, CAD and recent diagnosis of multiple small CVAs to the cerebellum (was not hospitalized for this) who presented to the emergency room on 3/23 with complaints of lower extremity weakness to the point where she was not able to walk that had started only a few days before.  In the emergency room, CT scan of head was unremarkable, but did note bilateral pulmonary emboli in the upper apices of the lungs, which were confirmed by CT scan of chest.  Patient not hypoxic nor complaining of chest pain.  Started on heparin and admitted to the hospitalist service.  Follow-up MRI of brain confirmed no evidence of new CVA.  MRI of thoracic and lumbar spine unremarkable.  Neurology who has been following patient feels weakness is more multifactorial and in part due to conditioning as well as effects from cancer treatment.  Recommending aggressive physical therapy. ?

## 2022-03-13 NOTE — Assessment & Plan Note (Signed)
Continuing home regimen of daily PPI therapy.  

## 2022-03-13 NOTE — Assessment & Plan Note (Addendum)
Patient overall has progressive global weakness that has been going on for several months but now has having acute weakness that is gotten to the point where she can barely get up.  No evidence of CVA or brain metastases by CT/MRI.  MRI of thoracic and lumbar spine unremarkable.  B12 levels are elevated, so no deficiency.  PT and OT will start seeing in the next 24 hours.  Neurology following.  MRI of cervical spine notes severe right C5-6 neural foraminal stenosis secondary to large right osteophyte, but this would not explain her symptoms.  Lower extremity Dopplers unremarkable for DVT.  Neurology feels this is multifactorial in part due to deconditioning as well as effects from her cancer treatments.  Patient will need skilled nursing for rehab. ?

## 2022-03-13 NOTE — Plan of Care (Signed)
  Problem: Education: Goal: Knowledge of General Education information will improve Description: Including pain rating scale, medication(s)/side effects and non-pharmacologic comfort measures Outcome: Progressing   Problem: Health Behavior/Discharge Planning: Goal: Ability to manage health-related needs will improve Outcome: Progressing   Problem: Clinical Measurements: Goal: Respiratory complications will improve Outcome: Progressing   

## 2022-03-13 NOTE — Progress Notes (Signed)
[facility] Progress Note ? ?Patient: [facility]i [name]    ZDG:[id number]  DOA: 03/12/2022    ?Date of Service: the patient was seen and examined on 03/13/2022 ? 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  In the emergency room, CT scan of head was unremarkable, but did note bilateral pulmonary emboli in the upper apices of the lungs, which were confirmed by CT scan of chest.  Patient not hypoxic nor complaining of chest pain.  Started on heparin and admitted to the hospitalist service.  Follow-up MRI of brain confirmed no evidence of new CVA. ? ?Assessment and Plan: ?Assessment and Plan: ?* Leg weakness, bilateral ?Patient overall has progressive global weakness that has been going on for several months but now has having acute weakness that is gotten to the point where she can barely get up.  No evidence of CVA or brain metastases by CT/MRI.  Checking MRI of the thoracic/lumbar spine.  B12 levels are elevated, so no deficiency.  PT and OT will start seeing in the next 24 hours.  We will also ask neurology for evaluation. ? ?Acute pulmonary embolism ([place]) ?New finding.  Fortunately, patient not hypoxic.  Bilateral.  Likely secondary to malignancy.  Patient started on IV heparin and will need to transition over to NOAC, although will need to do this in conjunction with medication for stroke. ? ?Coronary artery disease involving native coronary artery of native heart without angina pectoris ?Patient is currently chest pain free ?Monitoring patient on telemetry ?Continue home regimen of antiplatelet therapy,  lipid lowering therapy  ? ? ?Chronic diastolic CHF (congestive heart failure) ([place]) ?No clinical evidence of cardiogenic volume overload ? ? 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  She was not hospitalized for this.  Seen by neurology as outpatient.  It is unclear what work-up was done.  Aspirin increased from 81 mg to 325 at that time.  Will discuss with neurology and perhaps can do full work-up while in the hospital. ? ?Closed T7 fracture ([place]) ?Patient has a previous history of a fall causing a T7 compression fracture in 2020.  Kyphoplasty was attempted, but unsuccessful and patient continues to have chronic pain from this.  She is on as needed narcotics, but likes to take this very sparingly and continues to have significant amount of pain.  We will try lidocaine patch, which she is amenable to. ? ?Cancer of hilus of left lung ([place]) ?Being followed by oncology. ? ?Small cell lung cancer ([place]) ?Diagnosed 10/2018 ?Follows with Dr. Tasia Catchings in oncology clinic ?Currently on maintenance immunotherapy infusions with Tecentriq ?Continue outpatient follow-up ? ?Grade I diastolic dysfunction ?Patient with history of grade 1 diastolic heart failure.  Last echo was in August 2019.  Looks to be euvolemic, but will check BNP. ? ?Mixed hyperlipidemia ?Fasting lipid panel notes LDL of 76.  Given patient's CVA, target would be below 49.  Have increased statin to 80 mg daily. ? ? ?  Essential hypertension ?Blood pressure slightly soft.  Holding antihypertensives. ? ?GERD without esophagitis ?Continuing home regimen of daily PPI therapy. ? ? ? ? ? ? ? ? ?Body mass index is 22.55 kg/m?.  ?  ?   ? ?Consultants: ?Neurology ? ?Procedures: ?None ? ?Antimicrobials: ?None ? ?Code Status: Full code ? ? ?Subjective: Patient complains of her  chronic back pain.  Denies any shortness of breath.  Continues generalized weakness. ? ?Objective: ?Vital signs were reviewed and unremarkable. ?Vitals:  ? 03/13/22 0755 03/13/22 1135  ?BP: 115/78 95/63  ?Pulse: 83 82  ?Resp: 19 16  ?Temp: 97.6 ?F (36.4 ?C) 98.1 ?F (36.7 ?C)  ?SpO2: 96% 90%  ? ? ?Intake/Output Summary (Last 24 hours) at 03/13/2022 1424 ?Last data filed at 03/13/2022 0920 ?Gross per 24 hour  ?Intake 203.68 ml  ?Output 850 ml  ?Net -646.32 ml  ? ?Filed Weights  ? 03/12/22 1955  ?Weight: 65.3 kg  ? ?Body mass index is 22.55 kg/m?. ? ?Exam: ? ?General: Alert and oriented x3, no acute distress ?HEENT: Normocephalic, atraumatic, mucous membranes slightly dry ?Cardiovascular: Regular rate and rhythm, S1-S2 ?Respiratory: Clear to auscultation bilaterally ?Abdomen: Soft, nontender, nondistended, hypoactive bowel sounds ?Musculoskeletal: No clubbing or cyanosis or edema ?Skin: No skin breaks, tears or lesions ?Psychiatry: Appropriate, no evidence of psychoses ?Neurology: No focal deficits.  Patient has downgoing toes.  Cranial nerves II through XII are intact. ? ?Data Reviewed: ?Labs for today reviewed and are normal.  Noted normal B12 and folate. ? ?Disposition:  ?Status is: Inpatient ?Remains inpatient appropriate because: Further work-up, treatment ?  ? ?Anticipated discharge date: 3/26 ? ?Remaining issues to be resolved so that patient can be discharged: Further work-up for lower extremity weakness.  Determination of anticoagulation in conjunction with aspirin. ? ? ?Family Communication: Husband at the bedside ?DVT Prophylaxis: ?  Full dose heparin ? ? ? ?Author: ?Annita Brod ,MD ?03/13/2022 2:24 PM ? ?To reach On-call, see care teams to locate the attending and reach out via [email or web address]. ?Between 7PM-7AM, please contact night-coverage ?If you still have difficulty reaching the attending provider, please page the [facility] (Director on Call) for [facility] on amion for assistance. ? ?

## 2022-03-13 NOTE — Assessment & Plan Note (Addendum)
Fasting lipid panel notes LDL of 76.  Given patient's CVA, target would be below 66.  Have increased statin to 80 mg daily. ? ?

## 2022-03-13 NOTE — Progress Notes (Signed)
PT Cancellation Note ? ?Patient Details ?Name: Kristi [name] [facility] ?MRN: [id number] ?DOB: 10/11/1946 ? ? ?Cancelled Treatment:    Reason Eval/Treat Not Completed: Patient not medically ready.  Order received and chart reviewed. Pt noted to have new PE, started on heparin (start 03/13/22 @0441 ), per therapy protocol will hold 48 hours and initiate services as medically appropriate.  ?  ? ? ?Gwenlyn Saran, PT, DPT ?03/13/22, 11:05 AM ? ?

## 2022-03-13 NOTE — Assessment & Plan Note (Signed)
Being followed by oncology. ?

## 2022-03-13 NOTE — Assessment & Plan Note (Signed)
?   Patient is currently chest pain free ?? Monitoring patient on telemetry ?? Continue home regimen of antiplatelet therapy, lipid lowering therapy  ? ?

## 2022-03-13 NOTE — Assessment & Plan Note (Addendum)
Patient with history of grade 1 diastolic heart failure.  Last echo was in August 2019.  BNP at 48.  Patient euvolemic. ?

## 2022-03-13 NOTE — Assessment & Plan Note (Addendum)
New finding.  Fortunately, patient not hypoxic.  Bilateral.  Likely secondary to malignancy.  She will need to be on this lifelong.  High bleeding risk as she will still need to be on aspirin for stent.  Changed over to Xarelto on 3/26. ?

## 2022-03-13 NOTE — Assessment & Plan Note (Addendum)
?   No clinical evidence of cardiogenic volume overload, but she has been needing IV fluids for hypotension.  BNP normal to date.  Recheck in the morning ? ?

## 2022-03-13 NOTE — Assessment & Plan Note (Addendum)
Patient has a previous history of a fall causing a T7 compression fracture in 2020.  Kyphoplasty was attempted, but unsuccessful and patient continues to have chronic pain from this.  She is on as needed narcotics, but likes to take this very sparingly.  Have trialed her on lidocaine patch which she says actually makes a difference.  We will continue. ?

## 2022-03-13 NOTE — Assessment & Plan Note (Addendum)
Patient complaining of weakness in November 2022 and at that time CT scan showed possibility of brain metastases, however MRI confirmed these were not metastases, but multiple CVAs to the cerebellum.  She was not hospitalized for this.  Seen by neurology as outpatient.  It is unclear what work-up was done.  Discussed with neurology in consultation.  Patient will need both aspirin and full dose anticoagulation.  To decrease risk of bleeding, aspirin 325 mg decreased to 81 mg. ?

## 2022-03-13 NOTE — Progress Notes (Addendum)
OT Cancellation Note ? ?Patient Details ?Name: Kristi [name] [facility] ?MRN: [id number] ?DOB: 02/10/1946 ? ? ?Cancelled Treatment:    Reason Eval/Treat Not Completed: Patient not medically ready. Order received and chart reviewed. Pt noted to have new PE, started on heparin (start 03/13/22 @0441 ), per therapy protocol will hold 48 hours and initiate services as medically appropriate.  ? ? ?Dessie Coma, M.S. OTR/L  ?03/13/22, 9:35 AM  ?ascom [phone] ? ?

## 2022-03-13 NOTE — Assessment & Plan Note (Addendum)
?   Diagnosed 10/2018 ?? Follows with Dr. Tasia Catchings in oncology clinic ?? Currently on maintenance immunotherapy infusions with Tecentriq ?? Continue outpatient follow-up ?

## 2022-03-13 NOTE — Progress Notes (Signed)
ANTICOAGULATION CONSULT NOTE ? ?Pharmacy Consult for heparin infusion ?Indication: pulmonary embolus ? ?No Known Allergies ? ?Patient Measurements: ?Height: 5\' 7"  (170.2 cm) ?Weight: 65.3 kg (144 lb) ?IBW/kg (Calculated) : 61.6 ?Heparin Dosing Weight: 65.3 kg ? ?Vital Signs: ?Temp: 99 ?F (37.2 ?C) (03/23 2247) ?Temp Source: Oral (03/23 1955) ?BP: 109/69 (03/23 2247) ?Pulse Rate: 88 (03/23 2247) ? ?Labs: ?Recent Labs  ?  03/12/22 ?1958  ?HGB 13.3  ?HCT 41.6  ?PLT 177  ?CREATININE 1.00  ?CKTOTAL 117  ? ? ?Estimated Creatinine Clearance: 47.3 mL/min (by C-G formula based on SCr of 1 mg/dL). ? ? ?Medical History: ?Past Medical History:  ?Diagnosis Date  ? 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 1 stent  ? Claustrophobia   ? Coronary artery disease   ? GERD (gastroesophageal reflux disease)   ? Hypercholesteremia   ? MI, old   ? Pneumonia   ? Small cell lung cancer ([place])   ? 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 [facility]) 10/27/2018  ? ? 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 ?Goal of Therapy:  ?Heparin level 0.3-0.7 units/ml ?Monitor platelets by anticoagulation protocol: Yes ?  ?Plan:  ?Bolus 4500 units x 1 ?Start heparin infusion at 1050 units/hr ?Check HL in 8 hr after start of infusion ?CBC daily while on heparin. ? ?Renda Rolls, PharmD, MBA ?03/13/2022 ?3:42 AM ? ? ? ?

## 2022-03-13 NOTE — Progress Notes (Signed)
ANTICOAGULATION CONSULT NOTE ? ?Pharmacy Consult for heparin infusion ?Indication: pulmonary embolus ? ?No Known Allergies ? ?Patient Measurements: ?Height: 5\' 7"  (170.2 cm) ?Weight: 65.3 kg (144 lb) ?IBW/kg (Calculated) : 61.6 ?Heparin Dosing Weight: 65.3 kg ? ?Vital Signs: ?Temp: 98.1 ?F (36.7 ?C) (03/24 1135) ?Temp Source: Oral (03/24 1135) ?BP: 95/63 (03/24 1135) ?Pulse Rate: 82 (03/24 1135) ? ?Labs: ?Recent Labs  ?  03/12/22 ?1958 03/13/22 ?0355 03/13/22 ?1414  ?HGB 13.3 11.7*  --   ?HCT 41.6 35.5*  --   ?PLT 177 164  --   ?APTT  --  60*  --   ?LABPROT  --  13.3  --   ?INR  --  1.0  --   ?HEPARINUNFRC  --   --  0.83*  ?CREATININE 1.00 0.85  --   ?CKTOTAL 117  --   --   ? ? ? ?Estimated Creatinine Clearance: 55.6 mL/min (by C-G formula based on SCr of 0.85 mg/dL). ? ? ?Medical History: ?Past Medical History:  ?Diagnosis Date  ? 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 1 stent  ? Claustrophobia   ? Coronary artery disease   ? GERD (gastroesophageal reflux disease)   ? Hypercholesteremia   ? MI, old   ? Pneumonia   ? Small cell lung cancer ([place])   ? 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 [facility]) 10/27/2018  ? ? 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 ?Date Time aPTT/HL Rate/Comment ?3/24 1414 0.83  Supratherapeutic; 1050 > 900 un/hr    ? ?Baseline Labs: ?aPTT - drawn after gtt started ?INR - 1.0  ?Hgb - 13.3>11.7 ?Plts - 177>164 ? ? ?Goal of Therapy:  ?Heparin level 0.3-0.7 units/ml ?Monitor platelets by anticoagulation protocol: Yes ?  ?Plan:  ?Heparin level supratherapeutic @0 .83.  ?Decrease heparin infusion to 900 units/hr ?Check HL in 8 hr after start of infusion ?CBC daily while on heparin. ? ?Lorna Dibble, PharmD, BCCP ?Clinical Pharmacist ?03/13/2022 2:59 PM ? ? ? ? ?

## 2022-03-14 ENCOUNTER — Inpatient Hospital Stay: Payer: Medicare Other

## 2022-03-14 DIAGNOSIS — R29898 Other symptoms and signs involving the musculoskeletal system: Secondary | ICD-10-CM | POA: Diagnosis not present

## 2022-03-14 DIAGNOSIS — I2699 Other pulmonary embolism without acute cor pulmonale: Secondary | ICD-10-CM | POA: Diagnosis not present

## 2022-03-14 DIAGNOSIS — I959 Hypotension, unspecified: Secondary | ICD-10-CM | POA: Diagnosis not present

## 2022-03-14 DIAGNOSIS — I5032 Chronic diastolic (congestive) heart failure: Secondary | ICD-10-CM | POA: Diagnosis not present

## 2022-03-14 DIAGNOSIS — I5189 Other ill-defined heart diseases: Secondary | ICD-10-CM | POA: Diagnosis not present

## 2022-03-14 DIAGNOSIS — E440 Moderate protein-calorie malnutrition: Secondary | ICD-10-CM | POA: Diagnosis present

## 2022-03-14 LAB — HEPARIN LEVEL (UNFRACTIONATED)
Heparin Unfractionated: 0.38 IU/mL (ref 0.30–0.70)
Heparin Unfractionated: 0.43 IU/mL (ref 0.30–0.70)

## 2022-03-14 LAB — CBC
HCT: 34.9 % — ABNORMAL LOW (ref 36.0–46.0)
Hemoglobin: 11.5 g/dL — ABNORMAL LOW (ref 12.0–15.0)
MCH: 28.9 pg (ref 26.0–34.0)
MCHC: 33 g/dL (ref 30.0–36.0)
MCV: 87.7 fL (ref 80.0–100.0)
Platelets: 153 10*3/uL (ref 150–400)
RBC: 3.98 MIL/uL (ref 3.87–5.11)
RDW: 13.3 % (ref 11.5–15.5)
WBC: 7.4 10*3/uL (ref 4.0–10.5)
nRBC: 0 % (ref 0.0–0.2)

## 2022-03-14 LAB — HEMOGLOBIN A1C
Hgb A1c MFr Bld: 4.8 % (ref 4.8–5.6)
Mean Plasma Glucose: 91.06 mg/dL

## 2022-03-14 LAB — BRAIN NATRIURETIC PEPTIDE: B Natriuretic Peptide: 48.2 pg/mL (ref 0.0–100.0)

## 2022-03-14 LAB — TSH: TSH: 1.539 u[IU]/mL (ref 0.350–4.500)

## 2022-03-14 LAB — VITAMIN B12: Vitamin B-12: 5726 pg/mL — ABNORMAL HIGH (ref 180–914)

## 2022-03-14 MED ORDER — SODIUM CHLORIDE 0.9 % IV SOLN
INTRAVENOUS | Status: DC
Start: 2022-03-14 — End: 2022-03-18

## 2022-03-14 MED ORDER — GADOBUTROL 1 MMOL/ML IV SOLN
7.0000 mL | Freq: Once | INTRAVENOUS | Status: AC | PRN
  Administered 2022-03-14: 7 mL via INTRAVENOUS

## 2022-03-14 MED ORDER — SODIUM CHLORIDE 0.9 % IV BOLUS
250.0000 mL | Freq: Once | INTRAVENOUS | Status: AC
  Administered 2022-03-14: 250 mL via INTRAVENOUS

## 2022-03-14 NOTE — Assessment & Plan Note (Addendum)
Patient started having hypotension with systolic in the high 79G by mid afternoon 3/25.  Notified by nursing.  Patient asymptomatic.  Given fluid bolus and started on IV fluids.  Patient is not on any antihypertensives. ?

## 2022-03-14 NOTE — Progress Notes (Signed)
ANTICOAGULATION CONSULT NOTE ? ?Pharmacy Consult for heparin infusion ?Indication: pulmonary embolus ? ?No Known Allergies ? ?Patient Measurements: ?Height: 5\' 7"  (170.2 cm) ?Weight: 65.3 kg (144 lb) ?IBW/kg (Calculated) : 61.6 ?Heparin Dosing Weight: 65.3 kg ? ?Vital Signs: ?Temp: 99 ?F (37.2 ?C) (03/25 0018) ?Temp Source: Oral (03/24 2021) ?BP: 108/66 (03/25 0018) ?Pulse Rate: 84 (03/25 0018) ? ?Labs: ?Recent Labs  ?  03/12/22 ?1958 03/13/22 ?0355 03/13/22 ?1414 03/14/22 ?4268 03/14/22 ?0041  ?HGB 13.3 11.7*  --   --  11.5*  ?HCT 41.6 35.5*  --   --  34.9*  ?PLT 177 164  --   --  153  ?APTT  --  60*  --   --   --   ?LABPROT  --  13.3  --   --   --   ?INR  --  1.0  --   --   --   ?HEPARINUNFRC  --   --  0.83* 0.38  --   ?CREATININE 1.00 0.85  --   --   --   ?CKTOTAL 117  --   --   --   --   ? ? ? ?Estimated Creatinine Clearance: 55.6 mL/min (by C-G formula based on SCr of 0.85 mg/dL). ? ? ?Medical History: ?Past Medical History:  ?Diagnosis Date  ? Acute MI, inferoposterior wall ([place]) 09/30/2014  ? 1 stent  ? Claustrophobia   ? Coronary artery disease   ? GERD (gastroesophageal reflux disease)   ? Hypercholesteremia   ? MI, old   ? Pneumonia   ? Small cell lung cancer ([place])   ? 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 [facility]) 10/27/2018  ? ? 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 ?Date Time aPTT/HL Rate/Comment ?3/24 1414 0.83  Supratherapeutic; 1050 > 900 un/hr ?3/25 0032 0.38 Therapeutic x 1    ? ?Baseline Labs: ?aPTT - drawn after gtt started ?INR - 1.0  ?Hgb - 13.3>11.7 ?Plts - 177>164 ? ? ?Goal of Therapy:  ?Heparin level 0.3-0.7 units/ml ?Monitor platelets by anticoagulation protocol: Yes ?  ?Plan:  ?Continue heparin infusion at 900 units/hr ?Recheck HL in 8 hr to confirm, then daily ?CBC daily while on heparin. ? ?Renda Rolls, PharmD, MBA ?03/14/2022 ?1:38 AM ? ? ? ? ? ?

## 2022-03-14 NOTE — Progress Notes (Addendum)
[facility] Progress Note ? ?Patient: Kristi [name] [facility]    QVZ:[id number]  DOA: 03/12/2022    ?Date of Service: the patient was seen and examined on 03/14/2022 ? 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  In the emergency room, CT scan of head was unremarkable, but did note bilateral pulmonary emboli in the upper apices of the lungs, which were confirmed by CT scan of chest.  Patient not hypoxic nor complaining of chest pain.  Started on heparin and admitted to the hospitalist service.  Follow-up MRI of brain confirmed no evidence of new CVA.  MRI of thoracic and lumbar spine unremarkable.  Neurology consulted. ? ?Assessment and Plan: ?Assessment and Plan: ?* Leg weakness, bilateral ?Patient overall has progressive global weakness that has been going on for several months but now has having acute weakness that is gotten to the point where she can barely get up.  No evidence of CVA or brain metastases by CT/MRI.  MRI of thoracic and lumbar spine unremarkable.  B12 levels are elevated, so no deficiency.  PT and OT will start seeing in the next 24 hours.  Neurology following.  They recommend checking MRI of C-spine.  Lower extremity Dopplers unremarkable for DVT.  Given her coronary artery disease with stent placement she will need Eliquis and aspirin but be at higher risk for bleeding. ? ?Hypotension ?Patient started having hypotension with systolic in the high 32R by mid afternoon 3/25.  Notified by nursing.  Patient asymptomatic.  Given fluid bolus and started on IV fluids.  Need to be careful given her heart  failure. ? ?Acute pulmonary embolism ([place]) ?New finding.  Fortunately, patient not hypoxic.  Bilateral.  Likely secondary to malignancy.  Patient started on IV heparin and will need to transition over to NOAC, although will need to do this in conjunction with medication for stroke. ? ?Coronary artery disease involving native coronary artery of native heart without angina pectoris ?Patient is currently chest pain free ?Monitoring patient on telemetry ?Continue home regimen of antiplatelet therapy, lipid lowering therapy  ? ? ?Chronic diastolic CHF (congestive heart failure) ([place]) ?No clinical evidence of cardiogenic volume overload ? ? 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  She was not hospitalized for this.  Seen by neurology as outpatient.  It is unclear what work-up was done.  Aspirin increased from 81 mg to 325 at that time.  Will discuss with neurology and perhaps can do full work-up while in the hospital. ? ?Closed T7 fracture ([place]) ?Patient has a previous history of a fall causing a T7 compression fracture in 2020.  Kyphoplasty was attempted, but unsuccessful and patient continues to have chronic pain from this.  She is on as needed narcotics, but likes to take this very sparingly and continues to have significant amount of pain.  We will try lidocaine patch, which she is amenable to. ? ?Cancer of hilus of left lung ([place]) ?Being followed by oncology. ? ?  Small cell lung cancer ([place]) ?Diagnosed 10/2018 ?Follows with Dr. Tasia Catchings in oncology clinic ?Currently on maintenance immunotherapy infusions with Tecentriq ?Continue outpatient follow-up ? ?Grade I diastolic dysfunction ?Patient with history of grade 1 diastolic heart failure.  Last echo was in August 2019.  BNP at 48.  Patient euvolemic. ? ?Mixed  hyperlipidemia ?Fasting lipid panel notes LDL of 76.  Given patient's CVA, target would be below 18.  Have increased statin to 80 mg daily. ? ? ?Essential hypertension ?Blood pressure slightly soft.  Holding antihypertensives. ? ?GERD without esophagitis ?Continuing home regimen of daily PPI therapy. ? ? ? ?Malnutrition of moderate degree ?Nutrition Status: ?Nutrition Problem: Moderate Malnutrition ?Etiology: chronic illness (lung cancer) ?Signs/Symptoms: mild fat depletion, moderate fat depletion, mild muscle depletion, moderate muscle depletion ?Interventions: Boost Breeze, MVI, Liberalize Diet ? ? ? ? ? ? ? ? ? ?Body mass index is 22.55 kg/m?[name] Kitchen  ?Nutrition Problem: Moderate Malnutrition ?Etiology: chronic illness (lung cancer) ?   ? ?Consultants: ?Neurology ? ?Procedures: ?Lower extremity Dopplers: No evidence of DVT bilaterally ? ?Antimicrobials: ?None ? ?Code Status: Full code ? ? ?Subjective: Patient with some leg weakness and chronic back pain. ? ?Objective: ?Vital signs were reviewed and unremarkable. ?Vitals:  ? 03/14/22 1222 03/14/22 1624  ?BP: 92/70 (!) 88/66  ?Pulse: 92 92  ?Resp: 18 17  ?Temp: 98.3 ?F (36.8 ?C) 98.1 ?F (36.7 ?C)  ?SpO2: 91% 92%  ? ?No intake or output data in the 24 hours ending 03/14/22 1802 ? ?Filed Weights  ? 03/12/22 1955  ?Weight: 65.3 kg  ? ?Body mass index is 22.55 kg/m?. ? ?Exam: ? ?General: Alert and oriented x3, fatigued ?HEENT: Normocephalic, atraumatic, mucous membranes slightly dry ?Cardiovascular: Regular rate and rhythm, S1-S2 ?Respiratory: Clear to auscultation bilaterally ?Abdomen: Soft, nontender, nondistended, hypoactive bowel sounds ?Musculoskeletal: No clubbing or cyanosis or edema ?Skin: No skin breaks, tears or lesions ?Psychiatry: Appropriate, no evidence of psychoses ?Neurology: No focal deficits.  Patient has downgoing toes.  Cranial nerves II through XII are intact. ? ?Data Reviewed: ?Today's labs reviewed.  Hemoglobin stable 11.5.  Repeat BNP at  48. ? ?Disposition:  ?Status is: Inpatient ?Remains inpatient appropriate because: Further work-up, treatment ?  ? ?Anticipated discharge date: 3/27 ? ?Remaining issues to be resolved so that patient can be discharged: Further work-up for lower extremity weakness.  Determination of anticoagulation in conjunction with aspirin. ? ? ?Family Communication: Husband at the bedside ?DVT Prophylaxis: ?  Full dose heparin ? ? ? ?Author: ?Annita Brod ,MD ?03/14/2022 6:02 PM ? ?To reach On-call, see care teams to locate the attending and reach out via [email or web address]. ?Between 7PM-7AM, please contact night-coverage ?If you still have difficulty reaching the attending provider, please page the [facility] (Director on Call) for [facility] on amion for assistance. ? ?

## 2022-03-14 NOTE — Progress Notes (Signed)
ANTICOAGULATION CONSULT NOTE ? ?Pharmacy Consult for heparin infusion ?Indication: pulmonary embolus ? ?No Known Allergies ? ?Patient Measurements: ?Height: 5\' 7"  (170.2 cm) ?Weight: 65.3 kg (144 lb) ?IBW/kg (Calculated) : 61.6 ?Heparin Dosing Weight: 65.3 kg ? ?Vital Signs: ?Temp: 97.8 ?F (36.6 ?C) (03/25 0526) ?Temp Source: Oral (03/25 0526) ?BP: 102/82 (03/25 0930) ?Pulse Rate: 89 (03/25 0930) ? ?Labs: ?Recent Labs  ?  03/12/22 ?1958 03/13/22 ?0355 03/13/22 ?1414 03/14/22 ?3845 03/14/22 ?0041 03/14/22 ?3646  ?HGB 13.3 11.7*  --   --  11.5*  --   ?HCT 41.6 35.5*  --   --  34.9*  --   ?PLT 177 164  --   --  153  --   ?APTT  --  60*  --   --   --   --   ?LABPROT  --  13.3  --   --   --   --   ?INR  --  1.0  --   --   --   --   ?HEPARINUNFRC  --   --  0.83* 0.38  --  0.43  ?CREATININE 1.00 0.85  --   --   --   --   ?CKTOTAL 117  --   --   --   --   --   ? ? ? ?Estimated Creatinine Clearance: 55.6 mL/min (by C-G formula based on SCr of 0.85 mg/dL). ? ? ?Medical History: ?Past Medical History:  ?Diagnosis Date  ? Acute MI, inferoposterior wall ([place]) 09/30/2014  ? 1 stent  ? Claustrophobia   ? Coronary artery disease   ? GERD (gastroesophageal reflux disease)   ? Hypercholesteremia   ? MI, old   ? Pneumonia   ? Small cell lung cancer ([place])   ? 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 [facility]) 10/27/2018  ? ? 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 ?Date Time aPTT/HL Rate/Comment ?3/24 1414 0.83  Supratherapeutic; 1050 > 900 un/hr ?3/25 0032 0.38  Therapeutic x 1  ?3/25  0928 0.43  Therapeutic x 2   ? ?Baseline Labs: ?aPTT - drawn after gtt started ?INR - 1.0  ?Hgb - 13.3>11.7 ?Plts - 177>164 ? ? ?Goal of Therapy:  ?Heparin level 0.3-0.7 units/ml ?Monitor platelets by anticoagulation protocol: Yes ?  ?Plan:  ?Continue heparin infusion at  900 units/hr ?Recheck HL with AM labs ?CBC daily while on heparin. ? ?Pearla Dubonnet, PharmD ?Clinical Pharmacist ?03/14/2022 ?10:28 AM ? ? ? ? ? ? ?

## 2022-03-14 NOTE — Progress Notes (Signed)
OT Cancellation Note ? ?Patient Details ?Name: Kristi [name] [facility] ?MRN: [id number] ?DOB: September 11, 1946 ? ? ?Cancelled Treatment:    Reason Eval/Treat Not Completed: Patient not medically ready;Other (comment) (Per secure chat with MD [name], hold eval until tomorrow. Pt is with a new PE and not out of 48 hour window per typical therapy protocol for mobilization. OT will follow up for eval as approrpiate.) ? ?Shanon Payor, OTD OTR/L  ?03/14/22, 1:27 PM  ?

## 2022-03-14 NOTE — Assessment & Plan Note (Signed)
Nutrition Status: ?Nutrition Problem: Moderate Malnutrition ?Etiology: chronic illness (lung cancer) ?Signs/Symptoms: mild fat depletion, moderate fat depletion, mild muscle depletion, moderate muscle depletion ?Interventions: Boost Breeze, MVI, Liberalize Diet ? ? ? ?

## 2022-03-14 NOTE — Progress Notes (Deleted)
[facility] Progress Note ? ?Patient: Kristi [name]    IRS:[id number]  DOA: 03/12/2022    ?Date of Service: the patient was seen and examined on 03/14/2022 ? 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  In the emergency room, CT scan of head was unremarkable, but did note bilateral pulmonary emboli in the upper apices of the lungs, which were confirmed by CT scan of chest.  Patient not hypoxic nor complaining of chest pain.  Started on heparin and admitted to the hospitalist service.  Follow-up MRI of brain confirmed no evidence of new CVA.  MRI of thoracic and lumbar spine unremarkable.  Neurology consulted. ? ?Assessment and Plan: ?Assessment and Plan: ?* Leg weakness, bilateral ?Patient overall has progressive global weakness that has been going on for several months but now has having acute weakness that is gotten to the point where she can barely get up.  No evidence of CVA or brain metastases by CT/MRI.  MRI of thoracic and lumbar spine unremarkable.  B12 levels are elevated, so no deficiency.  PT and OT will start seeing in the next 24 hours.  Neurology following.  They recommend checking MRI of C-spine.  Lower extremity Dopplers unremarkable for DVT.  Given her coronary artery disease with stent placement she will need Eliquis and aspirin but be at higher risk for bleeding. ? ?Acute pulmonary embolism ([place]) ?New finding.  Fortunately, patient not hypoxic.  Bilateral.  Likely secondary to malignancy.  Patient started on IV heparin and will need to transition over to NOAC, although will need to do  this in conjunction with medication for stroke. ? ?Coronary artery disease involving native coronary artery of native heart without angina pectoris ?Patient is currently chest pain free ?Monitoring patient on telemetry ?Continue home regimen of antiplatelet therapy, lipid lowering therapy  ? ? ?Chronic diastolic CHF (congestive heart failure) ([place]) ?No clinical evidence of cardiogenic volume overload ? ? 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  She was not hospitalized for this.  Seen by neurology as outpatient.  It is unclear what work-up was done.  Aspirin increased from 81 mg to 325 at that time.  Will discuss with neurology and perhaps can do full work-up while in the [name]. ? ?Closed T7 fracture ([place]) ?Patient has a previous history of a fall causing a T7 compression fracture in 2020.  Kyphoplasty was attempted, but unsuccessful and patient continues to have chronic pain from this.  She is on as needed narcotics, but likes to take this very sparingly and continues to have significant amount of pain.  We will try lidocaine patch, which she is amenable to. ? ?Cancer of hilus of left lung ([place]) ?Being followed by oncology. ? ?Small cell lung cancer ([place]) ?Diagnosed 10/2018 ?Follows with Dr. Tasia Catchings in oncology clinic ?Currently on maintenance immunotherapy infusions with Tecentriq ?Continue outpatient follow-up ? ?Grade I diastolic dysfunction ?Patient with history of grade 1 diastolic heart failure.  Last  echo was in August 2019.  BNP at 48.  Patient euvolemic. ? ?Mixed hyperlipidemia ?Fasting lipid panel notes LDL of 76.  Given patient's CVA, target would be below 70.  Have increased statin to 80 mg daily. ? ? ?Essential hypertension ?Blood pressure slightly soft.  Holding antihypertensives. ? ?GERD without  esophagitis ?Continuing home regimen of daily PPI therapy. ? ? ? ?Malnutrition of moderate degree ?Nutrition Status: ?Nutrition Problem: Moderate Malnutrition ?Etiology: chronic illness (lung cancer) ?Signs/Symptoms: mild fat depletion, moderate fat depletion, mild muscle depletion, moderate muscle depletion ?Interventions: Boost Breeze, MVI, Liberalize Diet ? ? ? ? ? ? ? ? ? ?Body mass index is 22.55 kg/m?[name] Kitchen  ?Nutrition Problem: Moderate Malnutrition ?Etiology: chronic illness (lung cancer) ?   ? ?Consultants: ?Neurology ? ?Procedures: ?None ? ?Antimicrobials: ?None ? ?Code Status: Full code ? ? ?Subjective: Patient complains of her chronic back pain.  Denies any shortness of breath.  Continues generalized weakness. ? ?Objective: ?Vital signs were reviewed and unremarkable. ?Vitals:  ? 03/14/22 0930 03/14/22 1222  ?BP: 102/82 92/70  ?Pulse: 89 92  ?Resp:  18  ?Temp:  98.3 ?F (36.8 ?C)  ?SpO2: 90% 91%  ? ?No intake or output data in the 24 hours ending 03/14/22 1428 ? ?Filed Weights  ? 03/12/22 1955  ?Weight: 65.3 kg  ? ?Body mass index is 22.55 kg/m?. ? ?Exam: ? ?General: Alert and oriented x3, no acute distress ?HEENT: Normocephalic, atraumatic, mucous membranes slightly dry ?Cardiovascular: Regular rate and rhythm, S1-S2 ?Respiratory: Clear to auscultation bilaterally ?Abdomen: Soft, nontender, nondistended, hypoactive bowel sounds ?Musculoskeletal: No clubbing or cyanosis or edema ?Skin: No skin breaks, tears or lesions ?Psychiatry: Appropriate, no evidence of psychoses ?Neurology: No focal deficits.  Patient has downgoing toes.  Cranial nerves II through XII are intact. ? ?Data Reviewed: ?Labs for today reviewed and are normal.  Noted normal B12 and folate. ? ?Disposition:  ?Status is: Inpatient ?Remains inpatient appropriate because: Further work-up, treatment ?  ? ?Anticipated discharge date: 3/26 ? ?Remaining issues to be resolved so that patient can be discharged: Further work-up for lower extremity  weakness.  Determination of anticoagulation in conjunction with aspirin. ? ? ?Family Communication: Husband at the bedside ?DVT Prophylaxis: ?  Full dose heparin ? ? ? ?Author: ?Annita Brod ,MD ?03/14/2022 2:28 PM ? ?To reach On-call, see care teams to locate the attending and reach out via [email or web address]. ?Between 7PM-7AM, please contact night-coverage ?If you still have difficulty reaching the attending provider, please page the [facility] (Director on Call) for [facility] on amion for assistance. ? ?

## 2022-03-14 NOTE — Consult Note (Signed)
Neurology Consultation ? ?Reason for Consult: Progressive lower extremity weakness ?Referring Physician: Dr. [name] ? ?CC: Lower extremity weakness ? ?History is obtained from: Chart, patient ? ?HPI: Kristi [name] is a 76 y.o. female past medical history of coronary artery disease status post stents in 7425, diastolic congestive heart failure, hypertension, hyperlipidemia, metastatic small cell lung cancer, strokes in November 2022-initially thought to be mets but due to resolution of symptoms and the lesions, more consistent with infarct at that time-started on aspirin, presenting for evaluation of progressive lower extremity weakness to the point where it has become difficult to walk.  She has been having gradual worsening of her gait and ability to walk over the past few months but in the past few days to a week it has become to a point where she cannot walk at all and feels extremely weak in her legs.  She has had multiple chronic thoracic spine compression fractures.  Does not report any new mid or lower back pain but reports worsening of pain in her entire spine including new and worsening pain in her neck.  Does not report of any headaches.  Does not report of any vision changes or facial weakness. ?She reports that about couple of weeks ago, she was able to go to [place] with her husband and use a walker or a wheelchair but has not been able to do that over the past few days to week. ?Also complains of leg and calf pain. ?Noted to have bilateral PEs on this admission ? ?Does not complain of any urinary or bowel complaints.  Reports of some burning sensation in her legs.  Reports of pain in her leg and calf. ? ?Brain MRI personally reviewed-no acute strokes.  Old bilateral cerebellar infarcts. ?The hospitalist taking care of the patient has ordered spine imaging in the form of thoracic and lumbar imaging-shows no acute abnormality.  Unchanged mild thoracic degenerative disease without spinal canal or  neuroforaminal stenosis.  Unchanged old compression fractures of T4 and T7. ? ?ROS: Full ROS was performed and is negative except as noted in the HPI.  ? ?Past Medical History:  ?Diagnosis Date  ? Acute MI, inferoposterior wall ([place]) 09/30/2014  ? 1 stent  ? Claustrophobia   ? Coronary artery disease   ? GERD (gastroesophageal reflux disease)   ? Hypercholesteremia   ? MI, old   ? Pneumonia   ? Small cell lung cancer ([place])   ? 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 [facility]) 10/27/2018  ? ?Family History  ?Problem Relation Age of Onset  ? Alzheimer's disease Mother   ? Colon cancer Father   ? Breast cancer Neg Hx   ? ?Social History:  ? reports that she quit smoking about 3 years ago. Her smoking use included cigarettes. She started smoking about 43 years ago. She has a 39.00 pack-year smoking history. She has never used smokeless tobacco. She reports current alcohol use. She reports that she does not use drugs. ? ?Medications ? ?Current Facility-Administered Medications:  ?  acetaminophen (TYLENOL) tablet 650 mg, 650 mg, Oral, Q6H PRN **OR** acetaminophen (TYLENOL) suppository 650 mg, 650 mg, Rectal, Q6H PRN, Shalhoub, Sherryll Burger, MD ?  atorvastatin (LIPITOR) tablet 80 mg, 80 mg, Oral, Daily, Beers, Shanon Brow, RPH ?  Chlorhexidine Gluconate Cloth 2 % PADS 6 each, 6 each, Topical, Daily, Shalhoub, Sherryll Burger, MD, 6 each at 03/13/22 1056 ?  feeding supplement (BOOST / RESOURCE BREEZE) liquid 1 Container, 1 Container, Oral, TID BM, [name], Sendil  K, MD, 1 Container at 03/13/22 2028 ?  heparin ADULT infusion 100 units/mL (25000 units/217mL), 900 Units/hr, Intravenous, Continuous, Beers, Shanon Brow, RPH, Last Rate: 9 mL/hr at 03/14/22 0328, 900 Units/hr at 03/14/22 0328 ?  lidocaine (LIDODERM) 5 % 1 patch, 1 patch, Transdermal, Q24H, Annita Brod, MD, 1 patch at 03/13/22 1416 ?  multivitamin with minerals tablet 1 tablet, 1 tablet, Oral, Daily, Annita Brod, MD, 1 tablet at 03/13/22 1625 ?  ondansetron (ZOFRAN) tablet 4  mg, 4 mg, Oral, Q6H PRN **OR** ondansetron (ZOFRAN) injection 4 mg, 4 mg, Intravenous, Q6H PRN, Shalhoub, Sherryll Burger, MD ?  oxyCODONE (Oxy IR/ROXICODONE) immediate release tablet 5 mg, 5 mg, Oral, Q12H PRN, Shalhoub, Sherryll Burger, MD, 5 mg at 03/13/22 1659 ?  pantoprazole (PROTONIX) EC tablet 40 mg, 40 mg, Oral, BID, Shalhoub, Sherryll Burger, MD, 40 mg at 03/13/22 2027 ?  polyethylene glycol (MIRALAX / GLYCOLAX) packet 17 g, 17 g, Oral, Daily PRN, Shalhoub, Sherryll Burger, MD ?  prochlorperazine (COMPAZINE) tablet 10 mg, 10 mg, Oral, Q6H PRN, Shalhoub, Sherryll Burger, MD ?  sertraline (ZOLOFT) tablet 100 mg, 100 mg, Oral, Daily, Shalhoub, Sherryll Burger, MD, 100 mg at 03/13/22 0943 ?  sucralfate (CARAFATE) tablet 1 g, 1 g, Oral, TID PRN, Shalhoub, Sherryll Burger, MD ?  traZODone (DESYREL) tablet 50 mg, 50 mg, Oral, QHS PRN, Shalhoub, Sherryll Burger, MD ?  vitamin B-12 (CYANOCOBALAMIN) tablet 1,000 mcg, 1,000 mcg, Oral, Daily, Shalhoub, Sherryll Burger, MD, 1,000 mcg at 03/13/22 1610 ? ?Facility-Administered Medications Ordered in Other Encounters:  ?  heparin lock flush 100 unit/mL, 500 Units, Intravenous, Once, Earlie Server, MD ?  heparin lock flush 100 unit/mL, 500 Units, Intravenous, Once, Earlie Server, MD ?  sodium chloride flush (NS) 0.9 % injection 10 mL, 10 mL, Intravenous, PRN, Earlie Server, MD, 10 mL at 01/09/19 0820 ?  sodium chloride flush (NS) 0.9 % injection 10 mL, 10 mL, Intravenous, PRN, Earlie Server, MD, 10 mL at 01/10/19 1400 ? ? ?Exam: ?Current vital signs: ?BP 105/69 (BP Location: Left Arm)   Pulse 91   Temp 97.8 ?F (36.6 ?C) (Oral)   Resp 16   Ht 5\' 7"  (1.702 m)   Wt 65.3 kg   SpO2 93%   BMI 22.55 kg/m?  ?Vital signs in last 24 hours: ?Temp:  [97.8 ?F (36.6 ?C)-99 ?F (37.2 ?C)] 97.8 ?F (36.6 ?C) (03/25 0526) ?Pulse Rate:  [82-91] 91 (03/25 0526) ?Resp:  [16-18] 16 (03/25 0526) ?BP: (90-108)/(60-69) 105/69 (03/25 0526) ?SpO2:  [90 %-94 %] 93 % (03/25 0526) ?General: Awake alert in no distress ?HEENT: Normocephalic atraumatic ?Lungs: Scattered  rhonchi ?Abdomen: Nondistended nontender ?Extremities warm well perfused, cough tenderness present bilaterally on palpation. ?Neurological exam ?Awake alert oriented x3 ?Speech is mildly slow but not truly dysarthric. ?No evidence of aphasia ?Good attention concentration ?Cranial nerve examination: Pupils equal round reactive light, extraocular movements intact, visual fields full, face appears symmetric, tongue and palate midline. ?Motor examination: No drift in bilateral upper extremities.  She is able to lift both lower extremities antigravity at the hip with mild drift-4/5 strength bilaterally.  Distally at plantar and dorsiflexion she is 4+/5 bilaterally. ?Sensation is intact to light touch without extinction ?DTRs are 2+ to 3+ in bilateral knees and she has ankle clonus bilaterally which is more sustained on the left than the right.  No Hoffman's.  2+ reflexes in the upper extremities ?Coordination: There is mild ataxia in all 4 extremities. ? ?Labs ?I have reviewed labs  in epic and the results pertinent to this consultation are: ? ?CBC ?   ?Component Value Date/Time  ? WBC 7.4 03/14/2022 0041  ? RBC 3.98 03/14/2022 0041  ? HGB 11.5 (L) 03/14/2022 0041  ? HGB 14.5 09/19/2018 1038  ? HCT 34.9 (L) 03/14/2022 0041  ? HCT 42.7 09/19/2018 1038  ? PLT 153 03/14/2022 0041  ? PLT 265 09/19/2018 1038  ? MCV 87.7 03/14/2022 0041  ? MCV 91 09/19/2018 1038  ? MCH 28.9 03/14/2022 0041  ? MCHC 33.0 03/14/2022 0041  ? RDW 13.3 03/14/2022 0041  ? RDW 15.0 09/19/2018 1038  ? LYMPHSABS 1.3 03/13/2022 0355  ? LYMPHSABS 2.0 09/19/2018 1038  ? MONOABS 1.1 (H) 03/13/2022 0355  ? EOSABS 0.3 03/13/2022 0355  ? EOSABS 0.3 09/19/2018 1038  ? BASOSABS 0.1 03/13/2022 0355  ? BASOSABS 0.1 09/19/2018 1038  ? ? ?CMP  ?   ?Component Value Date/Time  ? NA 134 (L) 03/13/2022 0355  ? NA 136 09/19/2018 1038  ? K 3.5 03/13/2022 0355  ? CL 99 03/13/2022 0355  ? CO2 23 03/13/2022 0355  ? GLUCOSE 104 (H) 03/13/2022 0355  ? BUN 16 03/13/2022 0355  ?  BUN 6 (L) 09/19/2018 1038  ? CREATININE 0.85 03/13/2022 0355  ? CALCIUM 9.0 03/13/2022 0355  ? PROT 7.5 03/13/2022 0355  ? PROT 7.4 09/19/2018 1038  ? ALBUMIN 3.8 03/13/2022 0355  ? ALBUMIN 4.3 09/19/2018 1038  ? AST 45 (H)

## 2022-03-15 DIAGNOSIS — I10 Essential (primary) hypertension: Secondary | ICD-10-CM | POA: Diagnosis not present

## 2022-03-15 DIAGNOSIS — I5032 Chronic diastolic (congestive) heart failure: Secondary | ICD-10-CM | POA: Diagnosis not present

## 2022-03-15 DIAGNOSIS — I2699 Other pulmonary embolism without acute cor pulmonale: Secondary | ICD-10-CM | POA: Diagnosis not present

## 2022-03-15 DIAGNOSIS — R29898 Other symptoms and signs involving the musculoskeletal system: Secondary | ICD-10-CM | POA: Diagnosis not present

## 2022-03-15 LAB — CBC
HCT: 31.7 % — ABNORMAL LOW (ref 36.0–46.0)
Hemoglobin: 10.4 g/dL — ABNORMAL LOW (ref 12.0–15.0)
MCH: 28.9 pg (ref 26.0–34.0)
MCHC: 32.8 g/dL (ref 30.0–36.0)
MCV: 88.1 fL (ref 80.0–100.0)
Platelets: 146 10*3/uL — ABNORMAL LOW (ref 150–400)
RBC: 3.6 MIL/uL — ABNORMAL LOW (ref 3.87–5.11)
RDW: 13.4 % (ref 11.5–15.5)
WBC: 6.2 10*3/uL (ref 4.0–10.5)
nRBC: 0 % (ref 0.0–0.2)

## 2022-03-15 LAB — HEPARIN LEVEL (UNFRACTIONATED): Heparin Unfractionated: 0.37 IU/mL (ref 0.30–0.70)

## 2022-03-15 MED ORDER — RIVAROXABAN 15 MG PO TABS
15.0000 mg | ORAL_TABLET | Freq: Two times a day (BID) | ORAL | Status: DC
  Administered 2022-03-15 – 2022-03-18 (×6): 15 mg via ORAL
  Filled 2022-03-15 (×6): qty 1

## 2022-03-15 MED ORDER — RIVAROXABAN 20 MG PO TABS: 20.0000 mg | ORAL_TABLET | Freq: Every day | ORAL | Status: DC

## 2022-03-15 NOTE — Progress Notes (Signed)
[facility] Progress Note ? ?Patient: Kristi [name] [facility]    ZTI:[id number]  DOA: 03/12/2022    ?Date of Service: the patient was seen and examined on 03/15/2022 ? 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  In the emergency room, CT scan of head was unremarkable, but did note bilateral pulmonary emboli in the upper apices of the lungs, which were confirmed by CT scan of chest.  Patient not hypoxic nor complaining of chest pain.  Started on heparin and admitted to the hospitalist service.  Follow-up MRI of brain confirmed no evidence of new CVA.  MRI of thoracic and lumbar spine unremarkable.  Neurology who has been following patient feels weakness is more multifactorial and in part due to conditioning as well as effects from cancer treatment.  Recommending aggressive physical therapy. ? ?Assessment and Plan: ?Assessment and Plan: ?* Leg weakness, bilateral ?Patient overall has progressive global weakness that has been going on for several months but now has having acute weakness that is gotten to the point where she can barely get up.  No evidence of CVA or brain metastases by CT/MRI.  MRI of thoracic and lumbar spine unremarkable.  B12 levels are elevated, so no deficiency.  PT and OT will start seeing in the next 24 hours.  Neurology following.  MRI of cervical spine notes severe right C5-6 neural foraminal stenosis secondary to large right osteophyte, but this would not explain her symptoms.  Lower extremity Dopplers unremarkable for DVT.  Neurology feels this is multifactorial in part due to deconditioning  as well as effects from her cancer treatments.  Patient will need skilled nursing for rehab. ? ?Hypotension ?Patient started having hypotension with systolic in the high 82N by mid afternoon 3/25.  Notified by nursing.  Patient asymptomatic.  Given fluid bolus and started on IV fluids.  Need to be careful given her heart failure. ? ?Acute pulmonary embolism ([place]) ?New finding.  Fortunately, patient not hypoxic.  Bilateral.  Likely secondary to malignancy.  She will need to be on this lifelong.  High bleeding risk as she will still need to be on aspirin for stent.  Transitioning to Xarelto. ? ?Coronary artery disease involving native coronary artery of native heart without angina pectoris ?Patient is currently chest pain free ?Monitoring patient on telemetry ?Continue home regimen of antiplatelet therapy, lipid lowering therapy  ? ? ?Chronic diastolic CHF (congestive heart failure) ([place]) ?No clinical evidence of cardiogenic volume overload, but she has been needing IV fluids for hypotension.  Check a BNP in the morning. ? ? 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  She was not hospitalized for this.  Seen by neurology as outpatient.  It is unclear what work-up was done.  Aspirin increased from 81 mg to 325 at that time.  Will discuss with neurology and perhaps can do full work-up while in the hospital. ? ?Closed T7 fracture ([place]) ?Patient has a previous history of a fall  causing a T7 compression fracture in 2020.  Kyphoplasty was attempted, but unsuccessful and patient continues to have chronic pain from this.  She is on as needed narcotics, but likes to take this very sparingly and continues to have significant amount of pain.  We will try lidocaine patch, which she is amenable to. ? ?Cancer of hilus of left lung  ([place]) ?Being followed by oncology. ? ?Small cell lung cancer ([place]) ?Diagnosed 10/2018 ?Follows with Dr. Tasia Catchings in oncology clinic ?Currently on maintenance immunotherapy infusions with Tecentriq ?Continue outpatient follow-up ? ?Grade I diastolic dysfunction ?Patient with history of grade 1 diastolic heart failure.  Last echo was in August 2019.  BNP at 48.  Patient euvolemic. ? ?Mixed hyperlipidemia ?Fasting lipid panel notes LDL of 76.  Given patient's CVA, target would be below 69.  Have increased statin to 80 mg daily. ? ? ?Essential hypertension ?Blood pressure slightly soft.  Holding antihypertensives. ? ?GERD without esophagitis ?Continuing home regimen of daily PPI therapy. ? ? ? ?Malnutrition of moderate degree ?Nutrition Status: ?Nutrition Problem: Moderate Malnutrition ?Etiology: chronic illness (lung cancer) ?Signs/Symptoms: mild fat depletion, moderate fat depletion, mild muscle depletion, moderate muscle depletion ?Interventions: Boost Breeze, MVI, Liberalize Diet ? ? ? ? ? ? ? ? ? ?Body mass index is 22.55 kg/m?[name] Kitchen  ?Nutrition Problem: Moderate Malnutrition ?Etiology: chronic illness (lung cancer) ?   ? ?Consultants: ?Neurology ? ?Procedures: ?Lower extremity Dopplers: No evidence of DVT bilaterally ? ?Antimicrobials: ?None ? ?Code Status: Full code ? ? ?Subjective: Patient states back pain is better with lidocaine patch.  Continued leg weakness. ? ?Objective: ?Vital signs were reviewed and unremarkable. ?Vitals:  ? 03/15/22 0300 03/15/22 0747  ?BP: 103/71 96/68  ?Pulse:  80  ?Resp: 18 16  ?Temp: 97.9 ?F (36.6 ?C) 97.9 ?F (36.6 ?C)  ?SpO2: 100% 95%  ? ? ?Intake/Output Summary (Last 24 hours) at 03/15/2022 1418 ?Last data filed at 03/15/2022 0500 ?Gross per 24 hour  ?Intake 1420.91 ml  ?Output 200 ml  ?Net 1220.91 ml  ? ? ?Filed Weights  ? 03/12/22 1955  ?Weight: 65.3 kg  ? ?Body mass index is 22.55 kg/m?. ? ?Exam: ? ?General: Alert and oriented x3, fatigued ?HEENT: Normocephalic, atraumatic, mucous membranes  slightly dry ?Cardiovascular: Regular rate and rhythm, S1-S2 ?Respiratory: Clear to auscultation bilaterally ?Abdomen: Soft, nontender, nondistended, hypoactive bowel sounds ?Musculoskeletal: No clubbing or cyanosis or edema ?Skin: No skin breaks, tears or lesions ?Psychiatry: Appropriate, no evidence of psychoses ?Neurology: No focal deficits.  No changes. ? ?Data Reviewed: ?Today's labs reviewed.  Hemoglobin today at 10.4 ? ?Disposition:  ?Status is: Inpatient ?Remains inpatient appropriate because: Needs skilled nursing ?  ? ?Anticipated discharge date: 3/27 - 3/28 ? ?Remaining issues to be resolved so that patient can be discharged: Need short-term skilled nursing ? ? ?Family Communication: Husband at the bedside ?DVT Prophylaxis: ?  Full dose heparin ? ? ? ?Author: ?Annita Brod ,MD ?03/15/2022 2:18 PM ? ?To reach On-call, see care teams to locate the attending and reach out via [email or web address]. ?Between 7PM-7AM, please contact night-coverage ?If you still have difficulty reaching the attending provider, please page the [facility] (Director on Call) for [facility] on amion for assistance. ? ?

## 2022-03-15 NOTE — Evaluation (Signed)
Physical Therapy Evaluation ?Patient Details ?Name: Kristi [name] [facility] ?MRN: [id number] ?DOB: 19-Nov-1946 ?Today's Date: 03/15/2022 ? ?History of Present Illness ? Pt is a 76 year old female with past medical history of coronary artery disease, diastolic congestive heart failure, hyperlipidemia, hypertension, meatastatic small cell lung cancer, GERD, and T7 compression fracture. Outpatient MRI in 10/2021 revealed multiple new enhancing lesions in the cerebellum. Pt presents to [facility] with progressive lower extremity weakness and difficulty with ambulation. Upon admission, pt was found to have bilateral PE, BLE weakness, and hypotension. ?  ?Clinical Impression ? Pt was pleasant and motivated to participate during the session and put forth good effort throughout. Pt required extra time and effort along with physical assist for most functional tasks.  Pt was able to amb a max of around 15 feet with poor general safety with the RW and required min A for stability. Pt is at a high risk for falls and would not be safe to return to her prior living situation at this time. Pt will benefit from PT services in a SNF setting upon discharge to safely address deficits listed in patient problem list for decreased caregiver assistance and eventual return to PLOF. ? ?   ?   ? ?Recommendations for follow up therapy are one component of a multi-disciplinary discharge planning process, led by the attending physician.  Recommendations may be updated based on patient status, additional functional criteria and insurance authorization. ? ?Follow Up Recommendations Skilled nursing-short term rehab (<3 hours/day) ? ?  ?Assistance Recommended at Discharge Frequent or constant Supervision/Assistance  ?Patient can return home with the following ? A lot of help with walking and/or transfers;A lot of help with bathing/dressing/bathroom;Assistance with cooking/housework;Direct supervision/assist for medications management;Assist for transportation ? ?   ?Equipment Recommendations Other (comment) (TBD at next venue of care)  ?Recommendations for Other Services ?    ?  ?Functional Status Assessment Patient has had a recent decline in their functional status and demonstrates the ability to make significant improvements in function in a reasonable and predictable amount of time.  ? ?  ?Precautions / Restrictions Precautions ?Precautions: Fall ?Restrictions ?Weight Bearing Restrictions: No  ? ?  ? ?Mobility ? Bed Mobility ?Overal bed mobility: Needs Assistance ?Bed Mobility: Sit to Supine ?  ?  ?  ?Sit to supine: Mod assist ?  ?General bed mobility comments: Mod A for BLE and trunk control ?  ? ?Transfers ?Overall transfer level: Needs assistance ?Equipment used: Rolling walker (2 wheels) ?Transfers: Sit to/from Stand ?Sit to Stand: Min guard ?  ?  ?  ?  ?  ?General transfer comment: Mod verbal cues for hand placement with pt unable to come to standing without BUE assist ?  ? ?Ambulation/Gait ?Ambulation/Gait assistance: Min assist ?Gait Distance (Feet): 15 Feet ?Assistive device: Rolling walker (2 wheels) ?Gait Pattern/deviations: Step-through pattern, Decreased step length - right, Decreased step length - left, Trunk flexed ?Gait velocity: decreased ?  ?  ?General Gait Details: Slow cadence with min instability and cues needed for general safe use of the RW ? ?Stairs ?  ?  ?  ?  ?  ? ?Wheelchair Mobility ?  ? ?Modified Rankin (Stroke Patients Only) ?  ? ?  ? ?Balance Overall balance assessment: Needs assistance ?Sitting-balance support: No upper extremity supported, Feet supported ?Sitting balance-Leahy Scale: Fair ?  ?  ?Standing balance support: Bilateral upper extremity supported, During functional activity ?Standing balance-Leahy Scale: Poor ?Standing balance comment: Min A for stability during ambulation ?  ?  ?  ?  ?  ?  ?  ?  ?  ?  ?  ?   ? ? ? ?  Pertinent Vitals/Pain Pain Assessment ?Pain Assessment: 0-10 ?Pain Score: 4  ?Pain Location: lower back pain  (chronic) ?Pain Descriptors / Indicators: Discomfort, Sore ?Pain Intervention(s): Repositioned, Monitored during session  ? ? ?Home Living Family/patient expects to be discharged to:: Private residence ?Living Arrangements: Spouse/significant other ?Available Help at Discharge: Family;Available 24 hours/day ?Type of Home: Apartment ?Home Access: Level entry ?  ?  ?  ?Home Layout: One level ?Home Equipment: Rollator (4 wheels);Grab bars - toilet;Wheelchair - manual ?   ?  ?Prior Function Prior Level of Function : Independent/Modified Independent ?  ?  ?  ?  ?  ?  ?Mobility Comments: MOD-I with rollator for functional mobility of short household distances. Endorses 2 falls in the past 6 months. Pt & pt's husband reporting that she is able to stand for at least 10 minutes at a time before needing seated rest break ?ADLs Comments: MOD-I (requires increased time/effort) to perform ADLs at baseline. Husband assists with IADLs ?  ? ? ?Hand Dominance  ? Dominant Hand: Right ? ?  ?Extremity/Trunk Assessment  ? Upper Extremity Assessment ?Upper Extremity Assessment: Generalized weakness ?  ? ?Lower Extremity Assessment ?Lower Extremity Assessment: Generalized weakness ?  ? ?   ?Communication  ? Communication: No difficulties  ?Cognition Arousal/Alertness: Awake/alert ?Behavior During Therapy: [facility] for tasks assessed/performed ?Overall Cognitive Status: No family/caregiver present to determine baseline cognitive functioning ?  ?  ?  ?  ?  ?  ?  ?  ?  ?  ?  ?  ?  ?  ?  ?  ?  ?  ?  ? ?  ?General Comments General comments (skin integrity, edema, etc.): Pt negative for orthostatic hypotension. While on RA, SpO2 > 95% thoroughout. HR 90-100s during activity. ? ?  ?Exercises    ? ?Assessment/Plan  ?  ?PT Assessment Patient needs continued PT services  ?PT Problem List Decreased strength;Decreased activity tolerance;Decreased balance;Decreased mobility;Decreased knowledge of use of DME ? ?   ?  ?PT Treatment Interventions DME  instruction;Gait training;Functional mobility training;Therapeutic activities;Therapeutic exercise;Balance training;Patient/family education   ? ?PT Goals (Current goals can be found in the Care Plan section)  ?Acute Rehab PT Goals ?Patient Stated Goal: To get stronger ?PT Goal Formulation: With patient ?Time For Goal Achievement: 03/28/22 ?Potential to Achieve Goals: Good ? ?  ?Frequency Min 2X/week ?  ? ? ?Co-evaluation   ?  ?  ?  ?  ? ? ?  ?AM-PAC PT "6 Clicks" Mobility  ?Outcome Measure Help needed turning from your back to your side while in a flat bed without using bedrails?: A Little ?Help needed moving from lying on your back to sitting on the side of a flat bed without using bedrails?: A Little ?Help needed moving to and from a bed to a chair (including a wheelchair)?: A Little ?Help needed standing up from a chair using your arms (e.g., wheelchair or bedside chair)?: A Little ?Help needed to walk in hospital room?: A Lot ?Help needed climbing 3-5 steps with a railing? : Total ?6 Click Score: 15 ? ?  ?End of Session Equipment Utilized During Treatment: Gait belt ?Activity Tolerance: Patient tolerated treatment well ?Patient left: in bed;with call bell/phone within reach;with bed alarm set;with family/visitor present ?Nurse Communication: Mobility status ?PT Visit Diagnosis: Unsteadiness on feet (R26.81);Difficulty in walking, not elsewhere classified (R26.2);Muscle weakness (generalized) (M62.81) ?  ? ?Time: 4315-4008 ?PT Time Calculation (min) (ACUTE ONLY): 21 min ? ? ?Charges:   PT Evaluation ?$PT Eval  Moderate Complexity: 1 Mod ?  ?  ?   ? ? ?D. Royetta Asal PT, DPT ?03/15/22, 3:10 PM ? ? ?

## 2022-03-15 NOTE — Progress Notes (Signed)
ANTICOAGULATION CONSULT NOTE ? ?Pharmacy Consult for heparin infusion ?Indication: pulmonary embolus ? ?No Known Allergies ? ?Patient Measurements: ?Height: 5\' 7"  (170.2 cm) ?Weight: 65.3 kg (144 lb) ?IBW/kg (Calculated) : 61.6 ?Heparin Dosing Weight: 65.3 kg ? ?Vital Signs: ?Temp: 97.9 ?F (36.6 ?C) (03/26 0300) ?Temp Source: Oral (03/26 0300) ?BP: 103/71 (03/26 0300) ?Pulse Rate: 78 (03/25 2340) ? ?Labs: ?Recent Labs  ?  03/12/22 ?1958 03/13/22 ?0355 03/13/22 ?1414 03/14/22 ?7741 03/14/22 ?0041 03/14/22 ?2878 03/15/22 ?0440  ?HGB 13.3 11.7*  --   --  11.5*  --  10.4*  ?HCT 41.6 35.5*  --   --  34.9*  --  31.7*  ?PLT 177 164  --   --  153  --  146*  ?APTT  --  60*  --   --   --   --   --   ?LABPROT  --  13.3  --   --   --   --   --   ?INR  --  1.0  --   --   --   --   --   ?HEPARINUNFRC  --   --    < > 0.38  --  0.43 0.37  ?CREATININE 1.00 0.85  --   --   --   --   --   ?CKTOTAL 117  --   --   --   --   --   --   ? < > = values in this interval not displayed.  ? ? ? ?Estimated Creatinine Clearance: 55.6 mL/min (by C-G formula based on SCr of 0.85 mg/dL). ? ? ?Medical History: ?Past Medical History:  ?Diagnosis Date  ? 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 1 stent  ? Claustrophobia   ? Coronary artery disease   ? GERD (gastroesophageal reflux disease)   ? Hypercholesteremia   ? MI, old   ? Pneumonia   ? Small cell lung cancer ([place])   ? 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 [facility]) 10/27/2018  ? ? 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 ?Date Time aPTT/HL Rate/Comment ?3/24 1414 0.83  Supratherapeutic; 1050 > 900 un/hr ?3/25 0032 0.38  Therapeutic x 1  ?3/25  0928 0.43  Therapeutic x 2 ?3/26 0440 0.37  Therapeutic x 3   ? ?Baseline Labs: ?aPTT - drawn after gtt started ?INR - 1.0  ?Hgb - 13.3>11.7 ?Plts -  177>164 ? ? ?Goal of Therapy:  ?Heparin level 0.3-0.7 units/ml ?Monitor platelets by anticoagulation protocol: Yes ?  ?Plan:  ?Continue heparin infusion at 900 units/hr ?Recheck HL with AM labs ?CBC daily while on heparin. ? ?Renda Rolls, PharmD, MBA ?03/15/2022 ?5:17 AM ? ? ? ? ? ? ? ? ?

## 2022-03-15 NOTE — Progress Notes (Signed)
Neurology Progress Note ? ? ?S:// ?Patient seen and examined. ?No significant improvement in weakness ? ?O:// ?Current vital signs: ?BP 96/68 (BP Location: Left Arm)   Pulse 80   Temp 97.9 ?F (36.6 ?C) (Oral)   Resp 16   Ht 5\' 7"  (1.702 m)   Wt 65.3 kg   SpO2 95%   BMI 22.55 kg/m?  ?Vital signs in last 24 hours: ?Temp:  [97.7 ?F (36.5 ?C)-98.3 ?F (36.8 ?C)] 97.9 ?F (36.6 ?C) (03/26 0747) ?Pulse Rate:  [78-92] 80 (03/26 0747) ?Resp:  [16-18] 16 (03/26 0747) ?BP: (85-103)/(64-82) 96/68 (03/26 0747) ?SpO2:  [90 %-100 %] 95 % (03/26 0747) ?General: Awake alert in no distress ?HEENT: Normocephalic atraumatic ?Lungs: Scattered rhonchi ?Abdomen: Nondistended nontender ?Extremities warm well perfused, cough tenderness present bilaterally on palpation. ?Neurological exam ?Awake alert oriented x3 ?Speech is mildly slow but not truly dysarthric. ?No evidence of aphasia ?Good attention concentration ?Cranial nerve examination: Pupils equal round reactive light, extraocular movements intact, visual fields full, face appears symmetric, tongue and palate midline. ?Motor examination: No drift in bilateral upper extremities.  She is able to lift both lower extremities antigravity at the hip with mild drift-4/5 strength bilaterally.  Distally at plantar and dorsiflexion she is 4+/5 bilaterally. ?Sensation is intact to light touch without extinction ?DTRs are 2+ to 3+ in bilateral knees and she has ankle clonus bilaterally which is more sustained on the left than the right.  No Hoffman's.  2+ reflexes in the upper extremities ?Coordination: There is mild ataxia in all 4 extremities. ?Yesterday ? ?Medications ? ?Current Facility-Administered Medications:  ?  0.9 %  sodium chloride infusion, , Intravenous, Continuous, Annita Brod, MD, Last Rate: 100 mL/hr at 03/14/22 2201, Restarted at 03/14/22 2201 ?  acetaminophen (TYLENOL) tablet 650 mg, 650 mg, Oral, Q6H PRN, 650 mg at 03/14/22 1601 **OR** acetaminophen (TYLENOL)  suppository 650 mg, 650 mg, Rectal, Q6H PRN, Shalhoub, Sherryll Burger, MD ?  atorvastatin (LIPITOR) tablet 80 mg, 80 mg, Oral, Daily, Lorna Dibble, RPH, 80 mg at 03/15/22 0539 ?  Chlorhexidine Gluconate Cloth 2 % PADS 6 each, 6 each, Topical, Daily, Shalhoub, Sherryll Burger, MD, 6 each at 03/15/22 0840 ?  feeding supplement (BOOST / RESOURCE BREEZE) liquid 1 Container, 1 Container, Oral, TID BM, Annita Brod, MD, 1 Container at 03/15/22 0840 ?  heparin ADULT infusion 100 units/mL (25000 units/265mL), 900 Units/hr, Intravenous, Continuous, Beers, Shanon Brow, RPH, Last Rate: 9 mL/hr at 03/15/22 0822, 900 Units/hr at 03/15/22 7673 ?  lidocaine (LIDODERM) 5 % 1 patch, 1 patch, Transdermal, Q24H, Annita Brod, MD, 1 patch at 03/14/22 1400 ?  multivitamin with minerals tablet 1 tablet, 1 tablet, Oral, Daily, Annita Brod, MD, 1 tablet at 03/15/22 4193 ?  ondansetron (ZOFRAN) tablet 4 mg, 4 mg, Oral, Q6H PRN **OR** ondansetron (ZOFRAN) injection 4 mg, 4 mg, Intravenous, Q6H PRN, Shalhoub, Sherryll Burger, MD ?  oxyCODONE (Oxy IR/ROXICODONE) immediate release tablet 5 mg, 5 mg, Oral, Q12H PRN, Shalhoub, Sherryll Burger, MD, 5 mg at 03/14/22 0936 ?  pantoprazole (PROTONIX) EC tablet 40 mg, 40 mg, Oral, BID, Shalhoub, Sherryll Burger, MD, 40 mg at 03/15/22 7902 ?  polyethylene glycol (MIRALAX / GLYCOLAX) packet 17 g, 17 g, Oral, Daily PRN, Shalhoub, Sherryll Burger, MD ?  prochlorperazine (COMPAZINE) tablet 10 mg, 10 mg, Oral, Q6H PRN, Shalhoub, Sherryll Burger, MD ?  sertraline (ZOLOFT) tablet 100 mg, 100 mg, Oral, Daily, Shalhoub, Sherryll Burger, MD, 100 mg at 03/15/22 4097 ?  sucralfate (CARAFATE) tablet 1 g, 1 g, Oral, TID PRN, Shalhoub, Sherryll Burger, MD ?  traZODone (DESYREL) tablet 50 mg, 50 mg, Oral, QHS PRN, Shalhoub, Sherryll Burger, MD ?  vitamin B-12 (CYANOCOBALAMIN) tablet 1,000 mcg, 1,000 mcg, Oral, Daily, Shalhoub, Sherryll Burger, MD, 1,000 mcg at 03/15/22 6734 ? ?Facility-Administered Medications Ordered in Other Encounters:  ?  heparin lock flush 100  unit/mL, 500 Units, Intravenous, Once, Earlie Server, MD ?  heparin lock flush 100 unit/mL, 500 Units, Intravenous, Once, Earlie Server, MD ?  sodium chloride flush (NS) 0.9 % injection 10 mL, 10 mL, Intravenous, PRN, Earlie Server, MD, 10 mL at 01/09/19 0820 ?  sodium chloride flush (NS) 0.9 % injection 10 mL, 10 mL, Intravenous, PRN, Earlie Server, MD, 10 mL at 01/10/19 1400 ?Labs ?CBC ?   ?Component Value Date/Time  ? WBC 6.2 03/15/2022 0440  ? RBC 3.60 (L) 03/15/2022 0440  ? HGB 10.4 (L) 03/15/2022 0440  ? HGB 14.5 09/19/2018 1038  ? HCT 31.7 (L) 03/15/2022 0440  ? HCT 42.7 09/19/2018 1038  ? PLT 146 (L) 03/15/2022 0440  ? PLT 265 09/19/2018 1038  ? MCV 88.1 03/15/2022 0440  ? MCV 91 09/19/2018 1038  ? MCH 28.9 03/15/2022 0440  ? MCHC 32.8 03/15/2022 0440  ? RDW 13.4 03/15/2022 0440  ? RDW 15.0 09/19/2018 1038  ? LYMPHSABS 1.3 03/13/2022 0355  ? LYMPHSABS 2.0 09/19/2018 1038  ? MONOABS 1.1 (H) 03/13/2022 0355  ? EOSABS 0.3 03/13/2022 0355  ? EOSABS 0.3 09/19/2018 1038  ? BASOSABS 0.1 03/13/2022 0355  ? BASOSABS 0.1 09/19/2018 1038  ? ? ?CMP  ?   ?Component Value Date/Time  ? NA 134 (L) 03/13/2022 0355  ? NA 136 09/19/2018 1038  ? K 3.5 03/13/2022 0355  ? CL 99 03/13/2022 0355  ? CO2 23 03/13/2022 0355  ? GLUCOSE 104 (H) 03/13/2022 0355  ? BUN 16 03/13/2022 0355  ? BUN 6 (L) 09/19/2018 1038  ? CREATININE 0.85 03/13/2022 0355  ? CALCIUM 9.0 03/13/2022 0355  ? PROT 7.5 03/13/2022 0355  ? PROT 7.4 09/19/2018 1038  ? ALBUMIN 3.8 03/13/2022 0355  ? ALBUMIN 4.3 09/19/2018 1038  ? AST 45 (H) 03/13/2022 0355  ? ALT 40 03/13/2022 0355  ? ALKPHOS 73 03/13/2022 0355  ? BILITOT 0.7 03/13/2022 0355  ? BILITOT 0.5 09/19/2018 1038  ? GFRNONAA >60 03/13/2022 0355  ? GFRAA >60 09/23/2020 0845  ? ? ?glycosylated hemoglobin ? ?Lipid Panel  ?   ?Component Value Date/Time  ? CHOL 153 03/13/2022 0355  ? CHOL 162 09/19/2018 1038  ? TRIG 57 03/13/2022 0355  ? HDL 66 03/13/2022 0355  ? HDL 51 09/19/2018 1038  ? CHOLHDL 2.3 03/13/2022 0355  ? VLDL 11  03/13/2022 0355  ? [place] 76 03/13/2022 0355  ? [place] 89 09/19/2018 1038  ? ?Vitamin B12 5726 ?TSH 1.539 ? ?Imaging ?I have reviewed images in epic and the results pertinent to this consultation are: ?MRI brain-no acute changes.  Old bilateral cerebellar infarcts ?MRI of the cervical spine-no acute abnormality of the C-spine.  Severe right C5-6 neural foraminal stenosis secondary to large uncovertebral osteophyte.  Mild cervical degenerative disc disease without canal stenosis. ?MRI of the thoracic and lumbar spine - old T4 and T7 fractures and degenerative disc disease.  No acute findings. ?LE Dopplers: Negative for DVT ? 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 ?On examination has generalized weakness of the hip flexors and some ankle clonus which I think is all baseline.  No evidence of myelopathy or acute abnormality on MRI of the cervical thoracic or lumbar spine.  Brain MRI negative for acute stroke. ?Has had a new diagnosis of pulmonary emboli. ?Gait difficulty is related to generalized deconditioning from the malignancy process. ?I do not see any acutely correctable cause for that at this time. ? 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 ?Recommendations: ?-At this time, she is on anticoagulation because of the PEs.  She is also on aspirin for the history of coronary artery disease.  This plan should be addressed again with cardiology to see if she needs to be on both anticoagulant and antiplatelet given the high  risk of bleed. ?-From a stroke prevention standpoint, with no history of atrial fibrillation, only aspirin should be enough but given the PEs, she does require anticoagulation at this point.  She is being maintained on aspirin d

## 2022-03-15 NOTE — Evaluation (Signed)
Occupational Therapy Evaluation ?Patient Details ?Name: Kristi [name] [facility] ?MRN: [id number] ?DOB: [birth year] ?Today's Date: 03/15/2022 ? ? ?History of Present Illness 76 year old female with past medical history of coronary artery disease, diastolic congestive heart failure, hyperlipidemia, hypertension, meatastatic small cell lung cancer, GERD, and T7 compression fracture. Outpatient MRI in 10/2021 revealed multiple new enhancing lesions in the cerebellum. Pt presents to [facility] with progressive lower extremity weakness and difficulty with ambulation. Upon admission, pt was found to have bilateral PE  ? ?Clinical Impression ?  ?Pt seen for OT evaluation this date. At baseline, pt is MOD-I with rollator for functional mobility of household distances and ADLs. Prior to admission, pt was living in a 2-bedroom apartment with spouse who assists with IADLs. Pt's spouse reports that pt has had at least 2 recent falls. Pt currently requires MIN GUARD-MIN A for functional mobility of short household distances with RW and MIN GUARD for standing grooming tasks due to decreased strength, balance, and activity tolerance. Pt would benefit from additional skilled OT services to maximize return to PLOF and minimize risk of future falls, injury, caregiver burden, and readmission. Upon discharge, recommend STR prior to return home.  ? ?Of note: pt negative for orthostatic hypotension. While on RA, SpO2 > 95% thoroughout. HR 90-100s during activity.  ? ?Recommendations for follow up therapy are one component of a multi-disciplinary discharge planning process, led by the attending physician.  Recommendations may be updated based on patient status, additional functional criteria and insurance authorization.  ? ?Follow Up Recommendations ? Skilled nursing-short term rehab (<3 hours/day)  ?  ?Assistance Recommended at Discharge Frequent or constant Supervision/Assistance  ?Patient can return home with the following A lot of help with  bathing/dressing/bathroom;A little help with walking and/or transfers;Assistance with cooking/housework ? ?  ?Functional Status Assessment ? Patient has had a recent decline in their functional status and demonstrates the ability to make significant improvements in function in a reasonable and predictable amount of time.  ?Equipment Recommendations ? BSC/3in1  ?  ?   ?Precautions / Restrictions Precautions ?Precautions: Fall ?Restrictions ?Weight Bearing Restrictions: No  ? ?  ? ?Mobility Bed Mobility ?  ?  ?  ?  ?  ?  ?  ?General bed mobility comments: not assessed, pt in recliner pre/post session ?  ? ?Transfers ?Overall transfer level: Needs assistance ?Equipment used: Rolling walker (2 wheels) ?Transfers: Sit to/from Stand ?Sit to Stand: Min guard ?  ?  ?  ?  ?  ?General transfer comment: Requires verbal & tactile cues for safe UE placement with RW use and for positioning body with recliner prior to sitting ?  ? ?  ?Balance Overall balance assessment: Needs assistance ?Sitting-balance support: No upper extremity supported, Feet supported ?Sitting balance-Leahy Scale: Fair ?  ?  ?Standing balance support: Bilateral upper extremity supported, During functional activity ?Standing balance-Leahy Scale: Fair ?Standing balance comment: While performing standing grooming tasks, pt required b/l UE support (via forearms resting on sink counter) d/t decreased activity tolerance and decreased standing balance ?  ?  ?  ?  ?  ?  ?  ?  ?  ?  ?  ?   ? ?ADL either performed or assessed with clinical judgement  ? ?ADL Overall ADL's : Needs assistance/impaired ?  ?  ?Grooming: Oral care;Wash/dry face;Wash/dry hands;Min guard;Standing ?Grooming Details (indicate cue type and reason): Requires MIN GUARD d/t decreased activity tolerance and decreased standing balance. ?  ?  ?  ?  ?  ?  ?  ?  ?  ?  ?  ?  ?  ?  ?  Functional mobility during ADLs: Minimal assistance (62ft; x1 LOB requring MIN A to regain balance and MIN A for managing  RW) ?   ? ? ? ?Vision Baseline Vision/History: 1 Wears glasses ?Ability to See in Adequate Light: 0 Adequate ?Patient Visual Report: No change from baseline ?   ?   ?   ?   ? ?Pertinent Vitals/Pain Pain Assessment ?Pain Assessment: 0-10 ?Pain Score: 6  ?Pain Location: lower back pain (chronic) ?Pain Descriptors / Indicators: Discomfort ?Pain Intervention(s): Limited activity within patient's tolerance, Repositioned, Monitored during session  ? ? ? ?Hand Dominance Right ?  ?Extremity/Trunk Assessment Upper Extremity Assessment ?Upper Extremity Assessment: Generalized weakness ?  ?Lower Extremity Assessment ?Lower Extremity Assessment: Generalized weakness ?  ?  ?  ?Communication Communication ?Communication: No difficulties ?  ?Cognition Arousal/Alertness: Awake/alert ?Behavior During Therapy: [facility] for tasks assessed/performed ?Overall Cognitive Status: Impaired/Different from baseline ?  ?  ?  ?  ?  ?  ?  ?  ?  ?  ?  ?  ?  ?  ?  ?  ?General Comments: Pt alert and oriented to self, place, and parts of situation. Pt endorses decreased short term memory and required verbal cues for maintaining attention during OOB mobility ?  ?  ?General Comments  Pt negative for orthostatic hypotension. While on RA, SpO2 > 95% thoroughout. HR 90-100s during activity. ? ?  ?Exercises Other Exercises ?Other Exercises: Educated pt and pt's husband on role of OT in acute care setting, POC, and d/c recommendations; pt and family verbalized understanding ?  ?   ? ? ?Home Living Family/patient expects to be discharged to:: Private residence ?Living Arrangements: Spouse/significant other ?Available Help at Discharge: Family;Available 24 hours/day ?Type of Home: Apartment ?  ?  ?  ?  ?  ?  ?Bathroom Shower/Tub: Sponge bathes at baseline ?  ?Bathroom Toilet: Handicapped height ?  ?  ?Home Equipment: Rollator (4 wheels);Grab bars - toilet;Wheelchair - manual ?  ?  ? Lives With: Spouse ? ?  ?Prior Functioning/Environment Prior Level of Function :  Independent/Modified Independent ?  ?  ?  ?  ?  ?  ?Mobility Comments: MOD-I with rollator for functional mobility of short household distances. Endorses 2 falls in the past 6 months. Pt & pt's husband reporting that she is able to stand for at least 10 minutes at a time before needing seated rest break ?ADLs Comments: MOD-I (requires increased time/effort) to perform ADLs at baseline. Husband assists with IADLs ?  ? ?  ?  ?OT Problem List: Decreased strength;Decreased activity tolerance;Impaired balance (sitting and/or standing);Decreased cognition;Decreased knowledge of use of DME or AE;Decreased knowledge of precautions ?  ?   ?OT Treatment/Interventions: Self-care/ADL training;Therapeutic exercise;Energy conservation;DME and/or AE instruction;Therapeutic activities;Patient/family education;Balance training  ?  ?OT Goals(Current goals can be found in the care plan section) Acute Rehab OT Goals ?Patient Stated Goal: to increase strength prior to return to home ?OT Goal Formulation: With patient/family ?Time For Goal Achievement: 03/29/22 ?Potential to Achieve Goals: Good ?ADL Goals ?Pt Will Perform Grooming: with set-up;standing (while standing for >5 mins) ?Pt Will Perform Lower Body Dressing: with min assist;with adaptive equipment;sit to/from stand ?Pt Will Transfer to Toilet: with supervision;ambulating;bedside commode  ?OT Frequency: Min 2X/week ?  ? ?   ?AM-PAC OT "6 Clicks" Daily Activity     ?Outcome Measure Help from another person eating meals?: None ?Help from another person taking care of personal grooming?: A Little ?Help from another person toileting, which  includes using toliet, bedpan, or urinal?: A Little ?Help from another person bathing (including washing, rinsing, drying)?: A Lot ?Help from another person to put on and taking off regular upper body clothing?: A Little ?Help from another person to put on and taking off regular lower body clothing?: A Lot ?6 Click Score: 17 ?  ?End of Session  Equipment Utilized During Treatment: Gait belt;Rolling walker (2 wheels) ?Nurse Communication: Mobility status ? ?Activity Tolerance: Patient tolerated treatment well ?Patient left: in chair;with call bell/phone with

## 2022-03-16 DIAGNOSIS — R29898 Other symptoms and signs involving the musculoskeletal system: Secondary | ICD-10-CM | POA: Diagnosis not present

## 2022-03-16 DIAGNOSIS — I2699 Other pulmonary embolism without acute cor pulmonale: Secondary | ICD-10-CM | POA: Diagnosis not present

## 2022-03-16 DIAGNOSIS — I10 Essential (primary) hypertension: Secondary | ICD-10-CM | POA: Diagnosis not present

## 2022-03-16 DIAGNOSIS — G479 Sleep disorder, unspecified: Secondary | ICD-10-CM | POA: Diagnosis present

## 2022-03-16 DIAGNOSIS — I5032 Chronic diastolic (congestive) heart failure: Secondary | ICD-10-CM | POA: Diagnosis not present

## 2022-03-16 LAB — CBC
HCT: 30.6 % — ABNORMAL LOW (ref 36.0–46.0)
Hemoglobin: 10 g/dL — ABNORMAL LOW (ref 12.0–15.0)
MCH: 28.7 pg (ref 26.0–34.0)
MCHC: 32.7 g/dL (ref 30.0–36.0)
MCV: 87.7 fL (ref 80.0–100.0)
Platelets: 142 10*3/uL — ABNORMAL LOW (ref 150–400)
RBC: 3.49 MIL/uL — ABNORMAL LOW (ref 3.87–5.11)
RDW: 13.3 % (ref 11.5–15.5)
WBC: 6.1 10*3/uL (ref 4.0–10.5)
nRBC: 0 % (ref 0.0–0.2)

## 2022-03-16 MED ORDER — DIPHENHYDRAMINE HCL 25 MG PO CAPS
25.0000 mg | ORAL_CAPSULE | Freq: Every evening | ORAL | Status: DC | PRN
  Administered 2022-03-16: 25 mg via ORAL
  Filled 2022-03-16: qty 1

## 2022-03-16 NOTE — Progress Notes (Signed)
[facility] Progress Note ? ?Patient: Kristi [name] [facility]    RAQ:[id number]  DOA: 03/12/2022    ?Date of Service: the patient was seen and examined on 03/16/2022 ? 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  In the emergency room, CT scan of head was unremarkable, but did note bilateral pulmonary emboli in the upper apices of the lungs, which were confirmed by CT scan of chest.  Patient not hypoxic nor complaining of chest pain.  Started on heparin and admitted to the hospitalist service.  Follow-up MRI of brain confirmed no evidence of new CVA.  MRI of thoracic and lumbar spine unremarkable.  Neurology who has been following patient feels weakness is more multifactorial and in part due to conditioning as well as effects from cancer treatment.  Recommending aggressive physical therapy. ? ?Assessment and Plan: ?Assessment and Plan: ?* Leg weakness, bilateral ?Patient overall has progressive global weakness that has been going on for several months but now has having acute weakness that is gotten to the point where she can barely get up.  No evidence of CVA or brain metastases by CT/MRI.  MRI of thoracic and lumbar spine unremarkable.  B12 levels are elevated, so no deficiency.  PT and OT will start seeing in the next 24 hours.  Neurology following.  MRI of cervical spine notes severe right C5-6 neural foraminal stenosis secondary to large right osteophyte, but this would not explain her symptoms.  Lower extremity Dopplers unremarkable for DVT.  Neurology feels this is multifactorial in part due to deconditioning  as well as effects from her cancer treatments.  Patient will need skilled nursing for rehab. ? ?Hypotension ?Patient started having hypotension with systolic in the high 45G by mid afternoon 3/25.  Notified by nursing.  Patient asymptomatic.  Given fluid bolus and started on IV fluids.  Need to be careful given her heart failure. ? ?Acute pulmonary embolism ([place]) ?New finding.  Fortunately, patient not hypoxic.  Bilateral.  Likely secondary to malignancy.  She will need to be on this lifelong.  High bleeding risk as she will still need to be on aspirin for stent.  Changed over to Xarelto on 3/26. ? ?Coronary artery disease involving native coronary artery of native heart without angina pectoris ?Patient is currently chest pain free ?Monitoring patient on telemetry ?Continue home regimen of antiplatelet therapy, lipid lowering therapy  ? ? ?Chronic diastolic CHF (congestive heart failure) ([place]) ?No clinical evidence of cardiogenic volume overload, but she has been needing IV fluids for hypotension.  BNP normal to date.  Recheck in the morning ? ? 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  She was not hospitalized for this.  Seen by neurology as outpatient.  It is unclear what work-up was done.  Discussed with neurology in consultation.  Patient will need both aspirin and full dose anticoagulation. ? ?Closed T7 fracture ([place]) ?Patient has a previous history of a fall causing a T7 compression  fracture in 2020.  Kyphoplasty was attempted, but unsuccessful and patient continues to have chronic pain from this.  She is on as needed narcotics, but likes to take this very sparingly.  Have trialed her on lidocaine patch which she says actually makes a difference.  We will continue. ? ?Cancer of hilus of left lung  ([place]) ?Being followed by oncology. ? ?Small cell lung cancer ([place]) ?Diagnosed 10/2018 ?Follows with Dr. Tasia Catchings in oncology clinic ?Currently on maintenance immunotherapy infusions with Tecentriq ?Continue outpatient follow-up ? ?Grade I diastolic dysfunction ?Patient with history of grade 1 diastolic heart failure.  Last echo was in August 2019.  BNP at 48.  Patient euvolemic. ? ?Mixed hyperlipidemia ?Fasting lipid panel notes LDL of 76.  Given patient's CVA, target would be below 14.  Have increased statin to 80 mg daily. ? ? ?Essential hypertension ?Blood pressure slightly soft.  Holding antihypertensives. ? ?GERD without esophagitis ?Continuing home regimen of daily PPI therapy. ? ? ? ?Sleep disturbance ?Patient had a rough night and did not sleep very well.  We will add as needed Benadryl to her trazodone. ? ?Malnutrition of moderate degree ?Nutrition Status: ?Nutrition Problem: Moderate Malnutrition ?Etiology: chronic illness (lung cancer) ?Signs/Symptoms: mild fat depletion, moderate fat depletion, mild muscle depletion, moderate muscle depletion ?Interventions: Boost Breeze, MVI, Liberalize Diet ? ? ? ? ? ? ? ? ? ?Body mass index is 22.55 kg/m?[name] Kitchen  ?Nutrition Problem: Moderate Malnutrition ?Etiology: chronic illness (lung cancer) ?   ? ?Consultants: ?Neurology ? ?Procedures: ?Lower extremity Dopplers: No evidence of DVT bilaterally ? ?Antimicrobials: ?None ? ?Code Status: Full code ? ? ?Subjective: Quite tired. ? ?Objective: ?Vital signs were reviewed and unremarkable. ?Vitals:  ? 03/16/22 0300 03/16/22 0819  ?BP:  119/77  ?Pulse: 85 83  ?Resp: 18 17  ?Temp: 98.1 ?F (36.7 ?C) 99.3 ?F (37.4 ?C)  ?SpO2: 100% 97%  ? ? ?Intake/Output Summary (Last 24 hours) at 03/16/2022 1338 ?Last data filed at 03/15/2022 2200 ?Gross per 24 hour  ?Intake 1172 ml  ?Output --  ?Net 1172 ml  ? ? ?Filed Weights  ? 03/12/22 1955  ?Weight: 65.3 kg  ? ?Body mass index is 22.55 kg/m?. ? ?Exam: ? ?General: Fatigued, oriented x2 ?HEENT:  Normocephalic, atraumatic, mucous membranes slightly dry ?Cardiovascular: Regular rate and rhythm, S1-S2 ?Respiratory: Clear to auscultation bilaterally ?Abdomen: Soft, nontender, nondistended, hypoactive bowel sounds ?Musculoskeletal: No clubbing or cyanosis or edema ?Skin: No skin breaks, tears or lesions ?Psychiatry: Appropriate, no evidence of psychoses ?Neurology: No focal deficits.  No changes. ? ?Data Reviewed: ?Today's labs reviewed.  Hemoglobin at 10.0. ? ?Disposition:  ?Status is: Inpatient ?Remains inpatient appropriate because: Needs skilled nursing ?  ? ?Anticipated discharge date: 3/28 - 3/29 ? ?Remaining issues to be resolved so that patient can be discharged: Need short-term skilled nursing ? ? ?Family Communication: Husband at the bedside ?DVT Prophylaxis: ? ?Rivaroxaban (XARELTO) tablet 15 mg  ?rivaroxaban (XARELTO) tablet 20 mg  ? ? ?Author: ?Annita Brod ,MD ?03/16/2022 1:38 PM ? ?To reach On-call, see care teams to locate the attending and reach out via [email or web address]. ?Between 7PM-7AM, please contact night-coverage ?If you still have difficulty reaching the attending provider, please page the [facility] (Director on Call) for [facility] on amion for assistance. ? ?

## 2022-03-16 NOTE — Assessment & Plan Note (Signed)
Patient had a rough night and did not sleep very well.  We will add as needed Benadryl to her trazodone. ?

## 2022-03-16 NOTE — TOC Progression Note (Signed)
Transition of Care (TOC) - Progression Note  ? ? ?Patient Details  ?Name: Kristi [name] [facility] ?MRN: [id number] ?Date of Birth: [birth year] ? ?Transition of Care (TOC) CM/SW Contact  ?Pete Pelt, RN ?Phone Number: ?03/16/2022, 11:20 AM ? ?Clinical Narrative:   Bed search sent, awaiting responses.  TOC to follow. ? ? ? ?  ?  ? ?Expected Discharge Plan and Services ?  ?  ?  ?  ?  ?                ?  ?  ?  ?  ?  ?  ?  ?  ?  ?  ? ? ?Social Determinants of Health (SDOH) Interventions ?  ? ?Readmission Risk Interventions ?   ? View : No data to display.  ?  ?  ?  ? ? ?

## 2022-03-16 NOTE — NC FL2 (Signed)
?[place] MEDICAID FL2 LEVEL OF CARE SCREENING TOOL  ?  ? ?IDENTIFICATION  ?Patient Name: ?Kristi [name] FOYDXA Birthdate: April 12, 1946 Sex: female Admission Date (Current Location): ?03/12/2022  ?[place] and [place] Number: ? [place] ?  Facility and Address:  ?[facility], [address], [place], [place] [address] ?     Provider Number: ?[id number]  ?Attending Physician Name and Address:  ?Annita Brod, MD ? Relative Name and Phone Number:  ?Lansberry,Douglas (Spouse)   [phone] (Mobile) ?   ?Current Level of Care: ?Hospital Recommended Level of Care: ?[place] Prior Approval Number: ?  ? ?Date Approved/Denied: ?  PASRR Number: ?[id number] ? ?Discharge Plan: ?SNF ?  ? ?Current Diagnoses: ?Patient Active Problem List  ? Diagnosis Date Noted  ? Malnutrition of moderate degree 03/14/2022  ? Hypotension 03/14/2022  ? Acute pulmonary embolism ([place]) 03/13/2022  ? 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 Closed T7 fracture ([place]) 03/13/2022  ? Leg weakness, bilateral 03/12/2022  ? Coronary artery disease involving native coronary artery of native heart without angina pectoris 03/12/2022  ? Chronic diastolic CHF (congestive heart failure) ([place]) 03/12/2022  ? GERD without esophagitis 03/12/2022  ? Cerebrovascular accident (CVA) ([place]) 12/19/2021  ? Brain ischemia 09/29/2021  ? MCI (mild cognitive impairment) 06/19/2021  ? Aortic atherosclerosis ([place]) 06/18/2021  ? Incisional hernia, without obstruction or gangrene   ? Thrombocytopenia ([place]) 02/12/2021  ? 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 Diarrhea 12/05/2020  ? Osteopenia 08/12/2020  ? Ventral hernia without obstruction or gangrene 08/12/2020  ? Thoracic compression fracture, closed, initial encounter ([place]) 01/22/2020  ? Frequent falls 01/22/2020  ? Encounter for antineoplastic immunotherapy 09/14/2019  ? Memory loss 09/14/2019  ? Other fatigue 09/14/2019  ? Cancer of hilus of left lung ([place])  06/08/2019  ? Dehydration 01/30/2019  ? Anemia due to antineoplastic chemotherapy 01/09/2019  ? Encounter for antineoplastic chemotherapy 11/02/2018  ? Cough 10/28/2018  ? Dysphagia 10/28/2018  ? Decrease in appetite 10/28/2018  ? Goals of care, counseling/discussion 10/28/2018  ? Small cell lung cancer ([place]) 10/27/2018  ? Grade I diastolic dysfunction 35/46/5681  ? Localized edema 08/29/2018  ? Cholecystitis 08/13/2018  ? Osteopenia determined by x-ray 05/19/2018  ? Prediabetes 02/08/2018  ? Hx of fracture of radius 08/18/2016  ? Vitamin D deficiency 01/07/2016  ? Mixed hyperlipidemia 01/02/2016  ? Essential hypertension 01/02/2016  ? Chronic pain 01/02/2016  ? Coronary artery disease with stable angina pectoris ([place]) 01/02/2016  ? IBS (irritable bowel syndrome) 01/02/2016  ? FH: colon cancer 01/02/2016  ? DDD (degenerative disc disease), cervical 01/02/2016  ? Primary osteoarthritis of left knee 01/02/2016  ? Tobacco use disorder 01/02/2016  ? ? ?Orientation RESPIRATION BLADDER Height & Weight   ?  ?Self, Time, Situation, Place ? Normal Continent Weight: 65.3 kg ?Height:  5\' 7"  (170.2 cm)  ?BEHAVIORAL SYMPTOMS/MOOD NEUROLOGICAL BOWEL NUTRITION STATUS  ?    Continent Diet (Regular)  ?AMBULATORY STATUS COMMUNICATION OF NEEDS Skin   ?Limited Assist (A lot of help with walking and transfers) Verbally Normal ?  ?  ?  ?    ?     ?     ? ? ?Personal Care Assistance Level of Assistance  ?Bathing, Feeding, Dressing Bathing Assistance: Limited assistance ?Feeding assistance: Independent ?Dressing Assistance: Limited assistance ?   ? ?Functional Limitations Info  ?Sight, Hearing, Speech Sight Info: Impaired ?Hearing Info: Adequate ?Speech Info: Adequate  ? ? ?SPECIAL CARE FACTORS FREQUENCY  ?PT (By licensed  PT), OT (By licensed OT)   ?  ?PT Frequency: Min 5x weekly ?OT Frequency: Min 5x weekly ?  ?  ?  ?   ? ? ?Contractures Contractures Info: Not present  ? ? ?Additional Factors Info  ?Code Status, Allergies Code Status  Info: FULL CODE ?Allergies Info: No Known Allergies ?  ?  ?  ?   ? ?Current Medications (03/16/2022):  This is the current hospital active medication list ?Current Facility-Administered Medications  ?Medication Dose Route Frequency Provider Last Rate Last Admin  ? 0.9 %  sodium chloride infusion   Intravenous Continuous Annita Brod, MD 100 mL/hr at 03/16/22 0833 New Bag at 03/16/22 0833  ? acetaminophen (TYLENOL) tablet 650 mg  650 mg Oral Q6H PRN Vernelle Emerald, MD   650 mg at 03/14/22 1601  ? Or  ? acetaminophen (TYLENOL) suppository 650 mg  650 mg Rectal Q6H PRN Shalhoub, Sherryll Burger, MD      ? atorvastatin (LIPITOR) tablet 80 mg  80 mg Oral Daily Lorna Dibble, RPH   80 mg at 03/16/22 8676  ? Chlorhexidine Gluconate Cloth 2 % PADS 6 each  6 each Topical Daily Shalhoub, Sherryll Burger, MD   6 each at 03/16/22 1102  ? diphenhydrAMINE (BENADRYL) capsule 25 mg  25 mg Oral QHS PRN Annita Brod, MD      ? feeding supplement (BOOST / RESOURCE BREEZE) liquid 1 Container  1 Container Oral TID BM Annita Brod, MD   1 Container at 03/15/22 2124  ? lidocaine (LIDODERM) 5 % 1 patch  1 patch Transdermal Q24H Annita Brod, MD   1 patch at 03/15/22 1339  ? multivitamin with minerals tablet 1 tablet  1 tablet Oral Daily Annita Brod, MD   1 tablet at 03/16/22 7209  ? ondansetron (ZOFRAN) tablet 4 mg  4 mg Oral Q6H PRN Shalhoub, Sherryll Burger, MD      ? Or  ? ondansetron (ZOFRAN) injection 4 mg  4 mg Intravenous Q6H PRN Shalhoub, Sherryll Burger, MD      ? oxyCODONE (Oxy IR/ROXICODONE) immediate release tablet 5 mg  5 mg Oral Q12H PRN Shalhoub, Sherryll Burger, MD   5 mg at 03/14/22 4709  ? pantoprazole (PROTONIX) EC tablet 40 mg  40 mg Oral BID Vernelle Emerald, MD   40 mg at 03/16/22 6283  ? polyethylene glycol (MIRALAX / GLYCOLAX) packet 17 g  17 g Oral Daily PRN Shalhoub, Sherryll Burger, MD      ? prochlorperazine (COMPAZINE) tablet 10 mg  10 mg Oral Q6H PRN Shalhoub, Sherryll Burger, MD      ? Rivaroxaban (XARELTO) tablet 15  mg  15 mg Oral BID WC Dorothe Pea, RPH   15 mg at 03/16/22 6629  ? Followed by  ? [START ON 04/05/2022] rivaroxaban (XARELTO) tablet 20 mg  20 mg Oral Q supper Dorothe Pea, RPH      ? sertraline (ZOLOFT) tablet 100 mg  100 mg Oral Daily Shalhoub, Sherryll Burger, MD   100 mg at 03/16/22 4765  ? sucralfate (CARAFATE) tablet 1 g  1 g Oral TID PRN Shalhoub, Sherryll Burger, MD      ? traZODone (DESYREL) tablet 50 mg  50 mg Oral QHS PRN Vernelle Emerald, MD   50 mg at 03/15/22 2123  ? ?Facility-Administered Medications Ordered in Other Encounters  ?Medication Dose Route Frequency Provider Last Rate Last Admin  ? heparin lock flush 100 unit/mL  500  Units Intravenous Once Earlie Server, MD      ? heparin lock flush 100 unit/mL  500 Units Intravenous Once Earlie Server, MD      ? sodium chloride flush (NS) 0.9 % injection 10 mL  10 mL Intravenous PRN Earlie Server, MD   10 mL at 01/09/19 0820  ? sodium chloride flush (NS) 0.9 % injection 10 mL  10 mL Intravenous PRN Earlie Server, MD   10 mL at 01/10/19 1400  ? ? ? ?Discharge Medications: ?Please see discharge summary for a list of discharge medications. ? ?Relevant Imaging Results: ? ?Relevant Lab Results: ? ? ?Additional Information ?SSN [id number] ? ?Pete Pelt, RN ? ? ? ? ?

## 2022-03-17 DIAGNOSIS — E440 Moderate protein-calorie malnutrition: Secondary | ICD-10-CM

## 2022-03-17 DIAGNOSIS — R29898 Other symptoms and signs involving the musculoskeletal system: Secondary | ICD-10-CM | POA: Diagnosis not present

## 2022-03-17 DIAGNOSIS — I2699 Other pulmonary embolism without acute cor pulmonale: Secondary | ICD-10-CM | POA: Diagnosis not present

## 2022-03-17 DIAGNOSIS — I5032 Chronic diastolic (congestive) heart failure: Secondary | ICD-10-CM | POA: Diagnosis not present

## 2022-03-17 LAB — CBC
HCT: 29.7 % — ABNORMAL LOW (ref 36.0–46.0)
Hemoglobin: 9.8 g/dL — ABNORMAL LOW (ref 12.0–15.0)
MCH: 28.9 pg (ref 26.0–34.0)
MCHC: 33 g/dL (ref 30.0–36.0)
MCV: 87.6 fL (ref 80.0–100.0)
Platelets: 141 10*3/uL — ABNORMAL LOW (ref 150–400)
RBC: 3.39 MIL/uL — ABNORMAL LOW (ref 3.87–5.11)
RDW: 13.3 % (ref 11.5–15.5)
WBC: 5.6 10*3/uL (ref 4.0–10.5)
nRBC: 0 % (ref 0.0–0.2)

## 2022-03-17 LAB — BRAIN NATRIURETIC PEPTIDE: B Natriuretic Peptide: 105.9 pg/mL — ABNORMAL HIGH (ref 0.0–100.0)

## 2022-03-17 MED ORDER — ALUM & MAG HYDROXIDE-SIMETH 200-200-20 MG/5ML PO SUSP: 30.0000 mL | Freq: Four times a day (QID) | ORAL | Status: DC | PRN

## 2022-03-17 MED ORDER — ASPIRIN EC 81 MG PO TBEC: 81.0000 mg | DELAYED_RELEASE_TABLET | Freq: Every day | ORAL | 2 refills | Status: DC

## 2022-03-17 MED ORDER — POLYETHYLENE GLYCOL 3350 17 G PO PACK: 17.0000 g | PACK | Freq: Every day | ORAL | 0 refills | Status: AC | PRN

## 2022-03-17 MED ORDER — RIVAROXABAN 15 MG PO TABS: 15.0000 mg | ORAL_TABLET | Freq: Two times a day (BID) | ORAL | 0 refills | Status: DC

## 2022-03-17 MED ORDER — ALUM & MAG HYDROXIDE-SIMETH 200-200-20 MG/5ML PO SUSP: 30.0000 mL | Freq: Four times a day (QID) | ORAL | 0 refills | Status: AC | PRN

## 2022-03-17 MED ORDER — OXYCODONE HCL 5 MG PO TABS
5.0000 mg | ORAL_TABLET | Freq: Two times a day (BID) | ORAL | 0 refills | Status: DC | PRN
Start: 2022-03-17 — End: 2022-03-26

## 2022-03-17 MED ORDER — DIPHENHYDRAMINE HCL 25 MG PO CAPS: 25.0000 mg | ORAL_CAPSULE | Freq: Every evening | ORAL | 0 refills | Status: AC | PRN

## 2022-03-17 MED ORDER — RIVAROXABAN 20 MG PO TABS
20.0000 mg | ORAL_TABLET | Freq: Every day | ORAL | 3 refills | Status: DC
Start: 2022-04-05 — End: 2022-04-27

## 2022-03-17 MED ORDER — LIDOCAINE 5 % EX PTCH: 1.0000 | MEDICATED_PATCH | CUTANEOUS | 0 refills | Status: AC

## 2022-03-17 NOTE — TOC Progression Note (Signed)
Transition of Care (TOC) - Progression Note  ? ? ?Patient Details  ?Name: Rajni Holsworth [facility] ?MRN: [id number] ?Date of Birth: Dec 30, 1945 ? ?Transition of Care (TOC) CM/SW Contact  ?Pete Pelt, RN ?Phone Number: ?03/17/2022, 2:33 PM ? ?Clinical Narrative:   Family did not want [facility] or [facility] [facility] [employer]  Granddaughter called [facility], and as per Tammy, they can accept today. ? ? ? ?  ?  ? ?Expected Discharge Plan and Services ?  ?  ?  ?  ?  ?                ?  ?  ?  ?  ?  ?  ?  ?  ?  ?  ? ? ?Social Determinants of Health (SDOH) Interventions ?  ? ?Readmission Risk Interventions ?   ? View : No data to display.  ?  ?  ?  ? ? ?

## 2022-03-17 NOTE — Progress Notes (Signed)
Occupational Therapy Treatment ?Patient Details ?Name: Kristi [name] [facility] ?MRN: [id number] ?DOB: [birth year] ?Today's Date: 03/17/2022 ? ? ?History of present illness Pt is a 76 year old female with past medical history of coronary artery disease, diastolic congestive heart failure, hyperlipidemia, hypertension, meatastatic small cell lung cancer, GERD, and T7 compression fracture. Outpatient MRI in 10/2021 revealed multiple new enhancing lesions in the cerebellum. Pt presents to [facility] with progressive lower extremity weakness and difficulty with ambulation. Upon admission, pt was found to have bilateral PE, BLE weakness, and hypotension. ?  ?OT comments ? Pt seen for OT treatment on this date. Upon arrival to room, pt awake and seated upright in recliner. Pt reported urge to have a BM and requested assistance from this author. Pt also reported that she had not seen a provider all day, however chart review reveals that she was working with PT 10 mins prior to this author's arrival. Pt currently requires MIN A for functional mobility of short household distances (54ft), MIN A for BSC transfers, MAX A for sit>stand peri-care, and SET-UP assist for seated hand hygiene d/t decreased strength and activity tolerance. Pt is making good progress toward goals and continues to benefit from skilled OT services to maximize return to PLOF and minimize risk of future falls, injury, caregiver burden, and readmission. Will continue to follow POC. Discharge recommendation remains appropriate.    ? ?Recommendations for follow up therapy are one component of a multi-disciplinary discharge planning process, led by the attending physician.  Recommendations may be updated based on patient status, additional functional criteria and insurance authorization. ?   ?Follow Up Recommendations ? Skilled nursing-short term rehab (<3 hours/day)  ?  ?Assistance Recommended at Discharge Frequent or constant Supervision/Assistance  ?Patient can return home  with the following ? A lot of help with bathing/dressing/bathroom;A little help with walking and/or transfers;Assistance with cooking/housework ?  ?Equipment Recommendations ? BSC/3in1  ?  ?   ?Precautions / Restrictions Precautions ?Precautions: Fall ?Restrictions ?Weight Bearing Restrictions: No  ? ? ?  ? ?Mobility Bed Mobility ?  ?  ?  ?  ?  ?  ?  ?General bed mobility comments: not assessed, pt in recliner pre/post session ?  ? ?Transfers ?Overall transfer level: Needs assistance ?Equipment used: Rolling walker (2 wheels) ?Transfers: Sit to/from Stand ?Sit to Stand: Min assist ?  ?  ?  ?  ?  ?General transfer comment: Requires MIN A for upward momentum and verbal cues for safe hand placement with RW use ?  ?  ?Balance Overall balance assessment: Needs assistance ?Sitting-balance support: No upper extremity supported, Feet supported ?Sitting balance-Leahy Scale: Fair ?  ?  ?Standing balance support: Bilateral upper extremity supported, During functional activity ?Standing balance-Leahy Scale: Poor ?Standing balance comment: Min A for stability during ambulation ?  ?  ?  ?  ?  ?  ?  ?  ?  ?  ?  ?   ? ?ADL either performed or assessed with clinical judgement  ? ?ADL Overall ADL's : Needs assistance/impaired ?  ?  ?Grooming: Wash/dry hands;Set up;Sitting ?  ?  ?  ?  ?  ?  ?  ?  ?  ?Toilet Transfer: Minimal assistance;Ambulation;BSC/3in1;Rolling walker (2 wheels) ?Toilet Transfer Details (indicate cue type and reason): Requires frequent verbal cues for safe hand placement with RW use ?Toileting- Clothing Manipulation and Hygiene: Maximal assistance;Sit to/from stand ?Toileting - Clothing Manipulation Details (indicate cue type and reason): Requires MAX A for peri-care d/t decreased strength and balance ?  ?  ?  Functional mobility during ADLs: Minimal assistance;Rolling walker (2 wheels) (to walk 75ft) ?  ?  ? ? ? ?Cognition Arousal/Alertness: Awake/alert ?Behavior During Therapy: [facility] for tasks  assessed/performed ?Overall Cognitive Status: Impaired/Different from baseline ?  ?  ?  ?  ?  ?  ?  ?  ?  ?  ?  ?  ?  ?  ?  ?  ?General Comments: Pt alert and oriented to self, place, and parts of situation. Pt presents with decreased short term memory (reports that she hadnt seen a provider all morning, but chart review reveals that she worked with PT 10 min prior to this author's arrival) ?  ?  ?   ?   ?   ?   ? ? ?Pertinent Vitals/ Pain       Pain Assessment ?Pain Assessment: No/denies pain ? ?   ?   ? ?Frequency ? Min 2X/week  ? ? ? ? ?  ?Progress Toward Goals ? ?OT Goals(current goals can now be found in the care plan section) ? Progress towards OT goals: Progressing toward goals ? ?Acute Rehab OT Goals ?Patient Stated Goal: to increase strength prior to return to home ?OT Goal Formulation: With patient/family ?Time For Goal Achievement: 03/29/22 ?Potential to Achieve Goals: Good  ?Plan Discharge plan remains appropriate;Frequency remains appropriate   ? ?   ?AM-PAC OT "6 Clicks" Daily Activity     ?Outcome Measure ? ? Help from another person eating meals?: None ?Help from another person taking care of personal grooming?: A Little ?Help from another person toileting, which includes using toliet, bedpan, or urinal?: A Lot ?Help from another person bathing (including washing, rinsing, drying)?: A Lot ?Help from another person to put on and taking off regular upper body clothing?: A Little ?Help from another person to put on and taking off regular lower body clothing?: A Lot ?6 Click Score: 16 ? ?  ?End of Session Equipment Utilized During Treatment: Rolling walker (2 wheels) ? ?OT Visit Diagnosis: Unsteadiness on feet (R26.81);History of falling (Z91.81);Muscle weakness (generalized) (M62.81) ?  ?Activity Tolerance Patient tolerated treatment well ?  ?Patient Left in chair;with call bell/phone within reach;with family/visitor present ?  ?Nurse Communication Mobility status ?  ? ?   ? ?Time: 0100-7121 ?OT Time  Calculation (min): 30 min ? ?Charges: OT General Charges ?$OT Visit: 1 Visit ?OT Treatments ?$Self Care/Home Management : 23-37 mins ? ?Fredirick Maudlin, OTR/L ?[place] ? ?

## 2022-03-17 NOTE — Progress Notes (Signed)
Patient initially set to go to skilled nursing this afternoon.  Then skilled nursing facility felt there was issue as patient still getting active chemotherapy treatment.  Waiting to hear back from skilled nursing.  Discharge for 3/28 canceled and hopefully patient can be discharged to skilled nursing on 3/29. ?

## 2022-03-17 NOTE — Discharge Summary (Addendum)
Physician Discharge Summary   Patient: Kristi [name] MRN: [id number] DOB: 23-Sep-1946  Admit date:     03/12/2022  Discharge date: 03/18/22  Discharge Physician: Silvano Bilis   PCP: Reubin Milan, MD   Recommendations at discharge:   Medication change: Aspirin decreased to 81 mg p.o. daily New medication: Benadryl 25 p.o. nightly as needed for sleep if trazodone not enough New medication: Xarelto 15 mg p.o. twice daily with last day being 4/15.  Starting 4/16, Xarelto 20 mg once a day. New medication: MiraLAX 17 g p.o. daily as needed New medication: Maalox 30 cc p.o. 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  In the emergency room, CT scan of head was unremarkable, but did note bilateral pulmonary emboli in the upper apices of the lungs, which were confirmed by CT scan of chest.  Patient not hypoxic nor complaining of chest pain.  Started on heparin and  admitted to the hospitalist service.  Follow-up MRI of brain confirmed no evidence of new CVA.  MRI of thoracic and lumbar spine unremarkable.  Neurology who has been following patient feels weakness is more multifactorial and in part due to conditioning as well as effects from cancer treatment.  Recommending aggressive physical therapy.  Assessment and Plan: * Leg weakness, bilateral Patient overall has progressive global weakness that has been going on for several months but now has having acute weakness that is gotten to the point where she can barely get up.  No evidence of CVA or brain metastases by CT/MRI.  MRI of thoracic and lumbar spine unremarkable.  B12 levels are elevated, so no deficiency.  PT and OT will start seeing in the next 24 hours.  Neurology following.  MRI of cervical spine notes severe right C5-6 neural foraminal stenosis secondary to large right osteophyte, but this would not explain her symptoms.  Lower extremity Dopplers unremarkable for DVT.  Neurology feels this is multifactorial in part due to deconditioning as well as effects from her cancer treatments.  Patient will need skilled nursing for rehab.  Hypotension Patient started having hypotension with systolic in the high 80s by mid afternoon 3/25.  Notified by nursing.  Patient asymptomatic.  Given fluid bolus and started on IV fluids.  Patient is not on any antihypertensives.  Acute pulmonary embolism (HCC) New finding.  Fortunately, patient not hypoxic.  Bilateral.  Likely secondary to malignancy.  She will need to be on this lifelong.  High bleeding  risk as she will still need to be on aspirin for stent.  Changed over to Xarelto on 3/26.  Coronary artery disease involving native coronary artery of native heart without angina pectoris Patient is currently chest pain free Monitoring patient on telemetry Continue home regimen of antiplatelet therapy, lipid lowering therapy    Chronic diastolic CHF (congestive heart  failure) (HCC) No clinical evidence of cardiogenic volume overload, but she has been needing IV fluids for hypotension.  BNP normal to date.     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  She was not hospitalized for this.  Seen by neurology as outpatient.  It is unclear what work-up was done.  Discussed with neurology in consultation.  Patient will need both aspirin and full dose anticoagulation.  To decrease risk of bleeding, aspirin 325 mg decreased to 81 mg.  Closed T7 fracture (HCC) Patient has a previous history of a fall causing a T7 compression fracture in 2020.  Kyphoplasty was attempted, but unsuccessful and patient continues to have chronic pain from this.  She is on as needed narcotics, but likes to take this very sparingly.  Have trialed her on lidocaine patch which she says actually makes a difference.  We will continue.  Cancer of hilus of left lung [facility]) Being followed by oncology.  Small cell lung cancer [facility]) Diagnosed 10/2018 Follows with Dr. Cathie Hoops in oncology clinic Currently on maintenance immunotherapy infusions with Tecentriq Continue outpatient follow-up  Grade I diastolic dysfunction Patient with history of grade 1 diastolic heart failure.  Last echo was in August 2019.  BNP at 48.  Patient euvolemic.  Mixed hyperlipidemia Fasting lipid panel notes LDL of 76.  Given patient's CVA, target would be below 70.  Have increased statin to 80 mg daily.   GERD without esophagitis Continuing home regimen of daily PPI therapy.   Sleep disturbance Have added as needed Benadryl to her trazodone.  Malnutrition of moderate degree Nutrition Status: Nutrition Problem: Moderate Malnutrition Etiology: chronic illness (lung cancer) Signs/Symptoms: mild fat depletion, moderate  fat depletion, mild muscle depletion, moderate muscle depletion Interventions: Boost Breeze, MVI, Liberalize Diet    Consultants: Neurology Procedures performed: Lower extremity Dopplers: No evidence of DVT bilaterally Disposition: Home Diet recommendation:  Regular diet plus boost breeze twice daily between meals DISCHARGE MEDICATION: Allergies as of 03/18/2022   No Known Allergies      Medication List     TAKE these medications    acetaminophen 500 MG tablet Commonly known as: TYLENOL Take 2 tablets (1,000 mg total) by mouth every 6 (six) hours as needed for mild pain or headache.   alum & mag hydroxide-simeth 200-200-20 MG/5ML suspension Commonly known as: MAALOX/MYLANTA Take 30 mLs by mouth every 6 (six) hours as needed for indigestion or heartburn.   aspirin EC 81 MG tablet Take 1 tablet (81 mg total) by mouth daily. Swallow whole. What changed:  medication strength how much to take   atorvastatin 40 MG tablet Commonly known as: LIPITOR Take 1 tablet (40 mg total) by mouth daily.   Calcium 600-200 MG-UNIT tablet Take 1 tablet by mouth 2 (two) times daily.   Dialyvite Vitamin D 5000 125 MCG (5000 UT) capsule Generic drug: Cholecalciferol Take 5,000 Units by mouth daily.   diphenhydrAMINE 25 mg capsule Commonly known as: BENADRYL Take 1 capsule (  25 mg total) by mouth at bedtime as needed (if no sleep even with trazodone).   lidocaine 5 % Commonly known as: LIDODERM Place 1 patch onto the skin daily. Remove & Discard patch within 12 hours or as directed by MD   lidocaine-prilocaine cream Commonly known as: EMLA Apply 1 application topically daily as needed (port access).   Magnesium 250 MG Tabs Take 250 mg by mouth daily.   ondansetron 8 MG tablet Commonly known as: Zofran Take 1 tablet (8 mg total) by mouth every 8 (eight) hours as needed for nausea or vomiting.   oxyCODONE 5 MG immediate release tablet Commonly known as: Roxicodone Take 1 tablet  (5 mg total) by mouth every 12 (twelve) hours as needed for severe pain.   pantoprazole 40 MG tablet Commonly known as: Protonix Take 1 tablet (40 mg total) by mouth 2 (two) times daily.   polyethylene glycol 17 g packet Commonly known as: MIRALAX / GLYCOLAX Take 17 g by mouth daily as needed for mild constipation.   prochlorperazine 10 MG tablet Commonly known as: COMPAZINE Take 10 mg by mouth every 6 (six) hours as needed for nausea or vomiting.   Rivaroxaban 15 MG Tabs tablet Commonly known as: XARELTO Take 1 tablet (15 mg total) by mouth 2 (two) times daily with a meal for 18 days.   rivaroxaban 20 MG Tabs tablet Commonly known as: XARELTO Take 1 tablet (20 mg total) by mouth daily with supper. Start taking on: April 05, 2022   sertraline 100 MG tablet Commonly known as: ZOLOFT Take 1 tablet (100 mg total) by mouth daily.   sucralfate 1 g tablet Commonly known as: CARAFATE Take 1 g by mouth 3 (three) times daily as needed (heartburn).   traZODone 50 MG tablet Commonly known as: DESYREL Take 1 tablet (50 mg total) by mouth at bedtime as needed for sleep.   vitamin B-12 1000 MCG tablet Commonly known as: CYANOCOBALAMIN Take 1,000 mcg by mouth daily.        Contact information for after-discharge care     Destination     [facility] Preferred SNF .   Service: Skilled Nursing Contact information: [address] [place] [address] [phone]                    Discharge Exam: Ceasar Mons Weights   03/12/22 1955  Weight: 65.3 kg   General: Alert and oriented x2, no acute distress Cardiovascular: Regular rate and rhythm, S1-S2, mild systolic murmur Lungs: Clear to auscultation bilaterally Ext: no edema  Condition at discharge: improving  The results of significant diagnostics from this hospitalization (including imaging, microbiology, ancillary and laboratory) are listed below for reference.   Imaging Studies: CT  ANGIO HEAD NECK W WO CM  Result Date: 03/13/2022 CLINICAL DATA:  Acute neurologic deficit EXAM: CT ANGIOGRAPHY HEAD AND NECK TECHNIQUE: Multidetector CT imaging of the head and neck was performed using the standard protocol during bolus administration of intravenous contrast. Multiplanar CT image reconstructions and MIPs were obtained to evaluate the vascular anatomy. Carotid stenosis measurements (when applicable) are obtained utilizing NASCET criteria, using the distal internal carotid diameter as the denominator. RADIATION DOSE REDUCTION: This exam was performed according to the departmental dose-optimization program which includes automated exposure control, adjustment of the mA and/or kV according to patient size and/or use of iterative reconstruction technique. CONTRAST:  75mL OMNIPAQUE IOHEXOL 350 MG/ML SOLN COMPARISON:  None. FINDINGS: CTA NECK FINDINGS SKELETON: There is no  bony spinal canal stenosis. No lytic or blastic lesion. OTHER NECK: Normal pharynx, larynx and major salivary glands. No cervical lymphadenopathy. Unremarkable thyroid gland. UPPER CHEST: There is a pulmonary embolus within the main right pulmonary artery, extending into the right upper lobar branch. There are small emboli visible in the lingula. AORTIC ARCH: There is calcific atherosclerosis of the aortic arch. There is no aneurysm, dissection or hemodynamically significant stenosis of the visualized portion of the aorta. Conventional 3 vessel aortic branching pattern. 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 RIGHT CAROTID SYSTEM: No dissection, occlusion or aneurysm. There is mixed density atherosclerosis extending into the proximal ICA, resulting in less than 50% stenosis. LEFT CAROTID SYSTEM: No dissection, occlusion or aneurysm. There is calcified atherosclerosis extending into the proximal ICA, resulting in less than 50% stenosis. VERTEBRAL ARTERIES: Right dominant configuration. The left V1 and proximal V2  segments are occluded. The right vertebral artery is normal. There is opacification of the distal left vertebral artery beginning at approximately the C2 level. CTA HEAD FINDINGS POSTERIOR CIRCULATION: --Vertebral arteries: Normal V4 segments. --Inferior cerebellar arteries: Normal. --Basilar artery: Normal. --Superior cerebellar arteries: Normal. --Posterior cerebral arteries (PCA): Normal. ANTERIOR CIRCULATION: --Intracranial internal carotid arteries: Normal. --Anterior cerebral arteries (ACA): Normal. Both A1 segments are present. Patent anterior communicating artery (a-comm). --Middle cerebral arteries (MCA): Normal. VENOUS SINUSES: As permitted by contrast timing, patent. ANATOMIC VARIANTS: Fetal origins of both posterior cerebral arteries. Review of the MIP images confirms the above findings. IMPRESSION: 1. No intracranial arterial occlusion or high-grade stenosis. 2. 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 3. 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 4. Pulmonary emboli within the main right pulmonary artery, extending into the right upper lobar branch. Small emboli in the lingula. Aortic Atherosclerosis (ICD10-I70.0). Critical Value/emergent results were called by telephone at the time of interpretation on 03/13/2022 at 2:15 am to provider [facility] , who verbally acknowledged these results. Electronically Signed   By: Deatra Robinson M.D.   On: 03/13/2022 02:16   CT Head Wo Contrast  Result Date: 03/12/2022 CLINICAL DATA:  Decreased mobility EXAM: CT HEAD WITHOUT CONTRAST TECHNIQUE: Contiguous axial images were obtained from the base of the skull through the vertex without intravenous contrast. RADIATION DOSE REDUCTION: This exam was performed according to the departmental dose-optimization program which includes automated exposure control, adjustment of the mA and/or kV according to patient size and/or use of  iterative reconstruction technique. COMPARISON:  MRI 11/21/2021, CT brain 01/19/2020 FINDINGS: Brain: No acute territorial infarction, hemorrhage or intracranial mass. Extensive white matter hypodensity as before corresponding to history of prior brain radiation. 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 Atrophy. Stable ventricle size. Vascular: No hyperdense vessels.  Carotid vascular calcification Skull: Normal. Negative for fracture or focal lesion. Sinuses/Orbits: No acute finding. Other: None IMPRESSION: 1. No definite CT evidence for acute intracranial abnormality 2. Multiple small chronic infarcts in the cerebellum bilaterally 3. Similar appearance of atrophy and extensive white matter hypodensity. Electronically Signed   By: Jasmine Pang M.D.   On: 03/12/2022 21:20   CT Angio Chest Pulmonary Embolism (PE) W or WO Contrast  Addendum Date: 03/13/2022   ADDENDUM REPORT: 03/13/2022 03:38 ADDENDUM: Results discussed over the phone with Dr. Lonn Georgia at 3:20 a.m., 03/13/2022, with acknowledgement of findings. Electronically Signed   By: Almira Bar M.D.   On: 03/13/2022 03:38   Result Date: 03/13/2022 CLINICAL DATA:  Patient with  small cell lung cancer presenting with high suspicion of pulmonary embolism. EXAM: CT ANGIOGRAPHY CHEST WITH CONTRAST TECHNIQUE: Multidetector CT imaging of the chest was performed using the standard protocol during bolus administration of intravenous contrast. Multiplanar CT image reconstructions and MIPs were obtained to evaluate the vascular anatomy. RADIATION DOSE REDUCTION: This exam was performed according to the departmental dose-optimization program which includes automated exposure control, adjustment of the mA and/or kV according to patient size and/or use of iterative reconstruction technique. CONTRAST:  75mL OMNIPAQUE IOHEXOL 350 MG/ML SOLN COMPARISON:  CT studies of chest, abdomen and pelvis with contrast 10/17/2021 and 06/16/2021.  FINDINGS: Cardiovascular: Right chest port with IJ approach catheter, again terminating about the superior cavoatrial junction. The cardiac size is normal. There is moderate to heavy atherosclerosis in the aorta and great vessels, heavy three-vessel coronary artery atherosclerosis. There is no pericardial effusion. The pulmonary veins are decompressed. There is no aortic aneurysm or dissection. 70% mixed plaque stenosis in the proximal left subclavian artery is unchanged. Although the pulmonary arteries are within normal caliber limits, there is a linear thrombus within the right main pulmonary artery which is nonoccluding, with occluding thrombus extending from this into the right upper lobe main artery and the posterior and apical right upper lobe segmental arteries, and into the anterior segmental artery with nonoccluding thrombus. There are multiple subsegmental right upper lobe apical and posterior segment arteries showing lack of opacification which is probably due to thrombus but the and vessels are very small difficult to assess at the subsegmental level. There is additional non occluding thrombus in the interlobar pulmonary artery, in the right middle lobe medial and lateral segmental arteries, and in the right lower lobe main and basal segmental and multiple subsegmental arteries. On the left nonoccluding embolus is seen in the lingular main and lateral and medial segmental arteries. The remainder of the left lung arteries free of thrombus. There are no findings of acute right heart strain at this time. Mediastinum/Nodes: No enlarged mediastinal, hilar, or axillary lymph nodes. Thyroid gland, trachea, and esophagus demonstrate no significant findings. There is a small hiatal hernia. There are calcified subcarinal lymph nodes. Lungs/Pleura: The lungs are mildly emphysematous with centrilobular and paraseptal emphysematous changes predominating in the upper lobes. There are increased posterior basal  opacities in the right lower lobe which are suspected due to pulmonary infarctions. There is increased band atelectasis in the anterior basal right lower lobe segment. Scattered calcified granulomas again noted in the right. There is no pleural effusion, thickening or pneumothorax. Mild central bronchial thickening continues to be seen in the lower lobes, apparently chronic, without mucous plugging. There is a chronic atelectasis or fibrotic consolidation in the lingular base. The lungs are otherwise generally clear. No noncalcified nodule is seen. There are few linear scar-like opacities in both bases. Upper Abdomen: No acute abnormality. Musculoskeletal: Chronic severe T7 and moderate T4 compression fractures are redemonstrated, with kyphoplasty cement at T7. There is osteopenia and degenerative change, thoracic kyphodextroscoliosis without new abnormality. There is no destructive osseous or lytic lesion. there is a chronic sternal fracture of the Superior body of the sternum. Review of the MIP images confirms the above findings. IMPRESSION: 1. Bilateral arterial emboli right-greater-than-left with overall moderate clot burden. No findings of acute right heart strain at this time. 2. Opacities in the posterior basal right lower lobe most likely due to evolving infarctions. 3. COPD.  No suspicious nodule.  Old granulomatous calcifications. 4. Aortic, great vessel and coronary artery atherosclerosis. No aneurysm  or dissection. 5. 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 Electronically Signed: By: Almira Bar M.D. On: 03/13/2022 03:17   MR BRAIN W WO CONTRAST  Result Date: 03/13/2022 CLINICAL DATA:  Acute neurologic deficit EXAM: MRI HEAD WITHOUT AND WITH CONTRAST TECHNIQUE: Multiplanar, multiecho pulse sequences of the brain and surrounding structures were obtained without and with intravenous contrast. CONTRAST:  5mL GADAVIST GADOBUTROL 1 MMOL/ML IV SOLN COMPARISON:   None. FINDINGS: Brain: No acute infarct, mass effect or extra-axial collection. No acute or chronic hemorrhage. Confluent hyperintense T2-weighted white matter signal. Generalized volume loss without a clear lobar predilection. Old bilateral cerebellar infarcts. The midline structures are normal. There is no abnormal contrast enhancement. Vascular: Major flow voids are preserved. Skull and upper cervical spine: Normal calvarium and skull base. Visualized upper cervical spine and soft tissues are normal. Sinuses/Orbits:No paranasal sinus fluid levels or advanced mucosal thickening. No mastoid or middle ear effusion. Normal orbits. IMPRESSION: 1. No acute intracranial abnormality. 2. 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 Electronically Signed   By: Deatra Robinson M.D.   On: 03/13/2022 02:52   MR CERVICAL SPINE W WO CONTRAST  Result Date: 03/14/2022 CLINICAL DATA:  Ataxia EXAM: MRI CERVICAL SPINE WITHOUT AND WITH CONTRAST TECHNIQUE: Multiplanar and multiecho pulse sequences of the cervical spine, to include the craniocervical junction and cervicothoracic junction, were obtained without and with intravenous contrast. CONTRAST:  7mL GADAVIST GADOBUTROL 1 MMOL/ML IV SOLN COMPARISON:  None. FINDINGS: Alignment: Physiologic. Vertebrae: No fracture, evidence of discitis, or bone lesion. Cord: Normal signal and morphology. Posterior Fossa, vertebral arteries, paraspinal tissues: Negative. Disc levels: C1-2: Unremarkable. C2-3: Normal disc space and facet joints. There is no spinal canal stenosis. No neural foraminal stenosis. C3-4: Normal disc space and facet joints. There is no spinal canal stenosis. No neural foraminal stenosis. C4-5: Disc space narrowing with small disc osteophyte complex. There is no spinal canal stenosis. No neural foraminal stenosis. C5-6: Large right uncovertebral osteophyte superimposed on small disc bulge. There is no spinal canal stenosis. Severe right neural  foraminal stenosis. C6-7: Normal disc space and facet joints. There is no spinal canal stenosis. No neural foraminal stenosis. C7-T1: Normal disc space and facet joints. There is no spinal canal stenosis. No neural foraminal stenosis. IMPRESSION: 1. No acute abnormality of the cervical spine. 2. Severe right C5-6 neural foraminal stenosis secondary to large right uncovertebral osteophyte. 3. Mild cervical degenerative disc disease without spinal canal stenosis. Electronically Signed   By: Deatra Robinson M.D.   On: 03/14/2022 23:14   MR THORACIC SPINE W WO CONTRAST  Result Date: 03/13/2022 CLINICAL DATA:  Acute myelopathy EXAM: MRI THORACIC AND LUMBAR SPINE WITHOUT AND WITH CONTRAST TECHNIQUE: Multiplanar and multiecho pulse sequences of the thoracic and lumbar spine were obtained without and with intravenous contrast. CONTRAST:  7mL GADAVIST GADOBUTROL 1 MMOL/ML IV SOLN COMPARISON:  None. FINDINGS: MRI THORACIC SPINE FINDINGS Alignment:  Physiologic. Vertebrae: There are old compression fractures of T4 and T7, unchanged. No acute fracture. Cord:  Normal signal and morphology. Paraspinal and other soft tissues: Negative. Disc levels: Mild midthoracic degenerative disease without spinal canal stenosis. There is a small central disc protrusion at T7-8 and a small left subarticular disc protrusion at T8-9 MRI LUMBAR SPINE FINDINGS Segmentation:  Standard. Alignment:  Physiologic. Vertebrae:  No fracture, evidence of discitis, or bone lesion. Conus medullaris: Extends to the L1 level and appears normal. Paraspinal and other soft tissues: Negative. Disc levels: IMPRESSION: 1. No acute  abnormality of the thoracic or lumbar spine. 2. Unchanged mild thoracic degenerative disc disease without spinal canal or neural foraminal stenosis. 3. Unchanged old compression fractures of T4 and T7. Electronically Signed   By: Deatra Robinson M.D.   On: 03/13/2022 22:18   MR LUMBAR SPINE W WO CONTRAST  Result Date:  03/13/2022 CLINICAL DATA:  Acute myelopathy EXAM: MRI THORACIC AND LUMBAR SPINE WITHOUT AND WITH CONTRAST TECHNIQUE: Multiplanar and multiecho pulse sequences of the thoracic and lumbar spine were obtained without and with intravenous contrast. CONTRAST:  7mL GADAVIST GADOBUTROL 1 MMOL/ML IV SOLN COMPARISON:  None. FINDINGS: MRI THORACIC SPINE FINDINGS Alignment:  Physiologic. Vertebrae: There are old compression fractures of T4 and T7, unchanged. No acute fracture. Cord:  Normal signal and morphology. Paraspinal and other soft tissues: Negative. Disc levels: Mild midthoracic degenerative disease without spinal canal stenosis. There is a small central disc protrusion at T7-8 and a small left subarticular disc protrusion at T8-9 MRI LUMBAR SPINE FINDINGS Segmentation:  Standard. Alignment:  Physiologic. Vertebrae:  No fracture, evidence of discitis, or bone lesion. Conus medullaris: Extends to the L1 level and appears normal. Paraspinal and other soft tissues: Negative. Disc levels: IMPRESSION: 1. No acute abnormality of the thoracic or lumbar spine. 2. Unchanged mild thoracic degenerative disc disease without spinal canal or neural foraminal stenosis. 3. Unchanged old compression fractures of T4 and T7. Electronically Signed   By: Deatra Robinson M.D.   On: 03/13/2022 22:18   US Venous Img Lower Bilateral (DVT)  Result Date: 03/14/2022 CLINICAL DATA:  Pulmonary embolism. EXAM: BILATERAL LOWER EXTREMITY VENOUS DOPPLER ULTRASOUND TECHNIQUE: Gray-scale sonography with graded compression, as well as color Doppler and duplex ultrasound were performed to evaluate the lower extremity deep venous systems from the level of the common femoral vein and including the common femoral, femoral, profunda femoral, popliteal and calf veins including the posterior tibial, peroneal and gastrocnemius veins when visible. The superficial great saphenous vein was also interrogated. Spectral Doppler was utilized to evaluate flow at rest  and with distal augmentation maneuvers in the common femoral, femoral and popliteal veins. COMPARISON:  None. FINDINGS: RIGHT LOWER EXTREMITY Common Femoral Vein: No evidence of thrombus. Normal compressibility, respiratory phasicity and response to augmentation. Saphenofemoral Junction: No evidence of thrombus. Normal compressibility and flow on color Doppler imaging. Profunda Femoral Vein: No evidence of thrombus. Normal compressibility and flow on color Doppler imaging. Femoral Vein: No evidence of thrombus. Normal compressibility, respiratory phasicity and response to augmentation. Popliteal Vein: No evidence of thrombus. Normal compressibility, respiratory phasicity and response to augmentation. Calf Veins: No evidence of thrombus. Normal compressibility and flow on color Doppler imaging. Superficial Great Saphenous Vein: No evidence of thrombus. Normal compressibility. Venous Reflux:  None. Other Findings: No evidence of superficial thrombophlebitis or abnormal fluid collection. LEFT LOWER EXTREMITY Common Femoral Vein: No evidence of thrombus. Normal compressibility, respiratory phasicity and response to augmentation. Saphenofemoral Junction: No evidence of thrombus. Normal compressibility and flow on color Doppler imaging. Profunda Femoral Vein: No evidence of thrombus. Normal compressibility and flow on color Doppler imaging. Femoral Vein: No evidence of thrombus. Normal compressibility, respiratory phasicity and response to augmentation. Popliteal Vein: No evidence of thrombus. Normal compressibility, respiratory phasicity and response to augmentation. Calf Veins: No evidence of thrombus. Normal compressibility and flow on color Doppler imaging. Superficial Great Saphenous Vein: No evidence of thrombus. Normal compressibility. Venous Reflux:  None. Other Findings: No evidence of superficial thrombophlebitis or abnormal fluid collection. IMPRESSION: No evidence of deep venous thrombosis in  either lower  extremity. Electronically Signed   By: Irish Lack M.D.   On: 03/14/2022 11:50    Microbiology: Results for orders placed or performed during the hospital encounter of 03/12/22  Resp Panel by RT-PCR (Flu A&B, Covid) Nasopharyngeal Swab     Status: None   Collection Time: 03/12/22 10:04 PM   Specimen: Nasopharyngeal Swab; Nasopharyngeal(NP) swabs in vial transport medium  Result Value Ref Range Status   SARS Coronavirus 2 by RT PCR NEGATIVE NEGATIVE Final    Comment: (NOTE) SARS-CoV-2 target nucleic acids are NOT DETECTED.  The SARS-CoV-2 RNA is generally detectable in upper respiratory specimens during the acute phase of infection. The lowest concentration of SARS-CoV-2 viral copies this assay can detect is 138 copies/mL. A negative result does not preclude SARS-Cov-2 infection and should not be used as the sole basis for treatment or other patient management decisions. A negative result may occur with  improper specimen collection/handling, submission of specimen other than nasopharyngeal swab, presence of viral mutation(s) within the areas targeted by this assay, and inadequate number of viral copies(<138 copies/mL). A negative result must be combined with clinical observations, patient history, and epidemiological information. The expected result is Negative.  Fact Sheet for Patients:  BloggerCourse.com  Fact Sheet for Healthcare Providers:  SeriousBroker.it  This test is no t yet approved or cleared by the [place] FDA and  has been authorized for detection and/or diagnosis of SARS-CoV-2 by FDA under an Emergency Use Authorization (EUA). This EUA will remain  in effect (meaning this test can be used) for the duration of the COVID-19 declaration under Section 564(b)(1) of the Act, 21 U.S.C.section 360bbb-3(b)(1), unless the authorization is terminated  or revoked sooner.       Influenza A by PCR NEGATIVE NEGATIVE  Final   Influenza B by PCR NEGATIVE NEGATIVE Final    Comment: (NOTE) The Xpert Xpress SARS-CoV-2/FLU/RSV plus assay is intended as an aid in the diagnosis of influenza from Nasopharyngeal swab specimens and should not be used as a sole basis for treatment. Nasal washings and aspirates are unacceptable for Xpert Xpress SARS-CoV-2/FLU/RSV testing.  Fact Sheet for Patients: BloggerCourse.com  Fact Sheet for Healthcare Providers: SeriousBroker.it  This test is not yet approved or cleared by the [place] FDA and has been authorized for detection and/or diagnosis of SARS-CoV-2 by FDA under an Emergency Use Authorization (EUA). This EUA will remain in effect (meaning this test can be used) for the duration of the COVID-19 declaration under Section 564(b)(1) of the Act, 21 U.S.C. section 360bbb-3(b)(1), unless the authorization is terminated or revoked.  Performed at [facility], [address] [place], [place] [address]     Labs: CBC: Recent Labs  Lab 03/13/22 0355 03/14/22 0041 03/15/22 0440 03/16/22 0515 03/17/22 0554 03/18/22 0645  WBC 8.1 7.4 6.2 6.1 5.6 6.0  NEUTROABS 5.3  --   --   --   --   --   HGB 11.7* 11.5* 10.4* 10.0* 9.8* 10.0*  HCT 35.5* 34.9* 31.7* 30.6* 29.7* 29.7*  MCV 87.9 87.7 88.1 87.7 87.6 86.3  PLT 164 153 146* 142* 141* 156   Basic Metabolic Panel: Recent Labs  Lab 03/12/22 1958 03/13/22 0355  NA 138 134*  K 3.5 3.5  CL 103 99  CO2 26 23  GLUCOSE 127* 104*  BUN 18 16  CREATININE 1.00 0.85  CALCIUM 9.9 9.0  MG  --  1.8   Liver Function Tests: Recent Labs  Lab 03/13/22 0355  AST 45*  ALT 40  ALKPHOS 73  BILITOT 0.7  PROT 7.5  ALBUMIN 3.8   CBG: No results for input(s): GLUCAP in the last 168 hours.  Discharge time spent: less than 30 minutes.  Signed: Silvano Bilis, MD [facility] 03/18/2022

## 2022-03-17 NOTE — Care Management Important Message (Signed)
Important Message ? ?Patient Details  ?Name: Kristi [name] [facility] ?MRN: [id number] ?Date of Birth: Dec 13, 1946 ? ? ?Medicare Important Message Given:  Yes ? ?Patient is in an isolation room so I called her room [phone]) to review the Important Message from Medicare. I spoke with the patient and her husband and they are in agreement with the discharge plan. I asked if they would like a copy and they declined.  I wished her well and thanked him for his time. ? ? ?Juliann Pulse A [name] ?03/17/2022, 2:15 PM ?

## 2022-03-17 NOTE — TOC Progression Note (Signed)
Transition of Care (TOC) - Progression Note  ? ? ?Patient Details  ?Name: Kristi [name] ?MRN: [id number] ?Date of Birth: Jan 28, 1946 ? ?Transition of Care (TOC) CM/SW Contact  ?Pete Pelt, RN ?Phone Number: ?03/17/2022, 11:03 AM ? ?Clinical Narrative:   Patient has bed offers from [facility] and [facility].  Medicare Ratings provided to patient and spouse.  They will review and respond with a decision by noon today.  TOC to follow. ? ? ? ?  ?  ? ?Expected Discharge Plan and Services ?  ?  ?  ?  ?  ?                ?  ?  ?  ?  ?  ?  ?  ?  ?  ?  ? ? ?Social Determinants of Health (SDOH) Interventions ?  ? ?Readmission Risk Interventions ?   ? View : No data to display.  ?  ?  ?  ? ? ?

## 2022-03-17 NOTE — Progress Notes (Signed)
Physical Therapy Treatment ?Patient Details ?Name: Kristi [name] [facility] ?MRN: [id number] ?DOB: 11-Jul-1946 ?Today's Date: 03/17/2022 ? ? ?History of Present Illness Pt is a 76 year old female with past medical history of coronary artery disease, diastolic congestive heart failure, hyperlipidemia, hypertension, meatastatic small cell lung cancer, GERD, and T7 compression fracture. Outpatient MRI in 10/2021 revealed multiple new enhancing lesions in the cerebellum. Pt presents to [facility] with progressive lower extremity weakness and difficulty with ambulation. Upon admission, pt was found to have bilateral PE, BLE weakness, and hypotension. ? ?  ?PT Comments  ? ? Pt received in Semi-Fowler's position and agreeable to therapy.  Pt able to perform bed mobility with significantly increased amount of time.  Pt then transferred to standing with increased stability.  Pt does have mild instability with the walker and does not listen to verbal cuing for body placement within the walker.  Pt continues to demonstrate forward flexed posture when ambulating and staying away from walker.  Pt ambulated laps in room with management of lines and leads, and had one instance of instability that had to be corrected by therapist in order to prevent uncontrolled descent to the floor.  Pt then placed in recliner with all needs met and call bell within reach.  Current discharge plans to SNF remain appropriate at this time.  Pt will continue to benefit from skilled therapy in order to address deficits listed below. ? ? ?  ?Recommendations for follow up therapy are one component of a multi-disciplinary discharge planning process, led by the attending physician.  Recommendations may be updated based on patient status, additional functional criteria and insurance authorization. ? ?Follow Up Recommendations ? Skilled nursing-short term rehab (<3 hours/day) ?  ?  ?Assistance Recommended at Discharge Frequent or constant Supervision/Assistance  ?Patient can  return home with the following A lot of help with walking and/or transfers;A lot of help with bathing/dressing/bathroom;Assistance with cooking/housework;Direct supervision/assist for medications management;Assist for transportation ?  ?Equipment Recommendations ? Other (comment) (TBD at next venue of care)  ?  ?Recommendations for Other Services   ? ? ?  ?Precautions / Restrictions Precautions ?Precautions: Fall ?Restrictions ?Weight Bearing Restrictions: No  ?  ? ?Mobility ? Bed Mobility ?Overal bed mobility: Needs Assistance ?Bed Mobility: Supine to Sit ?  ?  ?Supine to sit: Supervision ?  ?  ?General bed mobility comments: Pt requiring increased time to perform, but overall able to do on her own with supervision.  Pt left in recliner upon leaving the room. ?  ? ?Transfers ?Overall transfer level: Needs assistance ?Equipment used: Rolling walker (2 wheels) ?Transfers: Sit to/from Stand ?Sit to Stand: Min guard ?  ?  ?  ?  ?  ?General transfer comment: Min verbal cues for hand placement with pt unable to come to standing without BUE assist ?  ? ?Ambulation/Gait ?Ambulation/Gait assistance: Min assist ?Gait Distance (Feet): 35 Feet ?Assistive device: Rolling walker (2 wheels) ?Gait Pattern/deviations: Step-through pattern, Decreased step length - right, Decreased step length - left, Trunk flexed ?Gait velocity: decreased ?  ?  ?General Gait Details: Slow cadence with one instance of instability that had to be corrected by therapist.  Pt has poor safety awareness with FWW. ? ? ?Stairs ?  ?  ?  ?  ?  ? ? ?Wheelchair Mobility ?  ? ?Modified Rankin (Stroke Patients Only) ?  ? ? ?  ?Balance Overall balance assessment: Needs assistance ?Sitting-balance support: No upper extremity supported, Feet supported ?Sitting balance-Leahy Scale: Fair ?  ?  ?  Standing balance support: Bilateral upper extremity supported, During functional activity ?Standing balance-Leahy Scale: Poor ?Standing balance comment: Min A for stability  during ambulation ?  ?  ?  ?  ?  ?  ?  ?  ?  ?  ?  ?  ? ?  ?Cognition Arousal/Alertness: Awake/alert ?Behavior During Therapy: [facility] for tasks assessed/performed ?Overall Cognitive Status: No family/caregiver present to determine baseline cognitive functioning ?  ?  ?  ?  ?  ?  ?  ?  ?  ?  ?  ?  ?  ?  ?  ?  ?  ?  ?  ? ?  ?Exercises   ? ?  ?General Comments   ?  ?  ? ?Pertinent Vitals/Pain Pain Assessment ?Pain Assessment: Faces ?Faces Pain Scale: Hurts little more ?Pain Location: lower back pain (chronic) ?Pain Descriptors / Indicators: Discomfort, Sore ?Pain Intervention(s): Limited activity within patient's tolerance, Monitored during session, Repositioned  ? ? ?Home Living   ?  ?  ?  ?  ?  ?  ?  ?  ?  ?   ?  ?Prior Function    ?  ?  ?   ? ?PT Goals (current goals can now be found in the care plan section) Acute Rehab PT Goals ?Patient Stated Goal: To get stronger ?PT Goal Formulation: With patient ?Time For Goal Achievement: 03/28/22 ?Potential to Achieve Goals: Good ?Progress towards PT goals: Progressing toward goals ? ?  ?Frequency ? ? ? Min 2X/week ? ? ? ?  ?PT Plan    ? ? ?Co-evaluation   ?  ?  ?  ?  ? ?  ?AM-PAC PT "6 Clicks" Mobility   ?Outcome Measure ? Help needed turning from your back to your side while in a flat bed without using bedrails?: A Little ?Help needed moving from lying on your back to sitting on the side of a flat bed without using bedrails?: A Little ?Help needed moving to and from a bed to a chair (including a wheelchair)?: A Little ?Help needed standing up from a chair using your arms (e.g., wheelchair or bedside chair)?: A Little ?Help needed to walk in hospital room?: A Lot ?Help needed climbing 3-5 steps with a railing? : Total ?6 Click Score: 15 ? ?  ?End of Session Equipment Utilized During Treatment: Gait belt ?Activity Tolerance: Patient tolerated treatment well ?Patient left: in bed;with call bell/phone within reach;with bed alarm set;with family/visitor present ?Nurse  Communication: Mobility status ?PT Visit Diagnosis: Unsteadiness on feet (R26.81);Difficulty in walking, not elsewhere classified (R26.2);Muscle weakness (generalized) (M62.81) ?  ? ? ?Time: 6803-2122 ?PT Time Calculation (min) (ACUTE ONLY): 25 min ? ?Charges:  $Gait Training: 23-37 mins          ?          ? ?Gwenlyn Saran, PT, DPT ?03/17/22, 12:44 PM ? ? ? ?Kristi [name] ?03/17/2022, 12:41 PM ? ?

## 2022-03-18 DIAGNOSIS — M6281 Muscle weakness (generalized): Secondary | ICD-10-CM | POA: Diagnosis not present

## 2022-03-18 DIAGNOSIS — R488 Other symbolic dysfunctions: Secondary | ICD-10-CM | POA: Diagnosis not present

## 2022-03-18 DIAGNOSIS — E440 Moderate protein-calorie malnutrition: Secondary | ICD-10-CM | POA: Diagnosis not present

## 2022-03-18 DIAGNOSIS — M488X5 Other specified spondylopathies, thoracolumbar region: Secondary | ICD-10-CM | POA: Diagnosis not present

## 2022-03-18 DIAGNOSIS — R29898 Other symptoms and signs involving the musculoskeletal system: Secondary | ICD-10-CM | POA: Diagnosis not present

## 2022-03-18 DIAGNOSIS — E785 Hyperlipidemia, unspecified: Secondary | ICD-10-CM | POA: Diagnosis not present

## 2022-03-18 DIAGNOSIS — R498 Other voice and resonance disorders: Secondary | ICD-10-CM | POA: Diagnosis not present

## 2022-03-18 DIAGNOSIS — F32A Depression, unspecified: Secondary | ICD-10-CM | POA: Diagnosis not present

## 2022-03-18 DIAGNOSIS — J99 Respiratory disorders in diseases classified elsewhere: Secondary | ICD-10-CM | POA: Diagnosis not present

## 2022-03-18 DIAGNOSIS — I2699 Other pulmonary embolism without acute cor pulmonale: Secondary | ICD-10-CM | POA: Diagnosis not present

## 2022-03-18 DIAGNOSIS — M6259 Muscle wasting and atrophy, not elsewhere classified, multiple sites: Secondary | ICD-10-CM | POA: Diagnosis not present

## 2022-03-18 DIAGNOSIS — R2681 Unsteadiness on feet: Secondary | ICD-10-CM | POA: Diagnosis not present

## 2022-03-18 DIAGNOSIS — C3490 Malignant neoplasm of unspecified part of unspecified bronchus or lung: Secondary | ICD-10-CM | POA: Diagnosis not present

## 2022-03-18 DIAGNOSIS — G4700 Insomnia, unspecified: Secondary | ICD-10-CM | POA: Diagnosis not present

## 2022-03-18 DIAGNOSIS — Z86711 Personal history of pulmonary embolism: Secondary | ICD-10-CM | POA: Diagnosis not present

## 2022-03-18 DIAGNOSIS — Z736 Limitation of activities due to disability: Secondary | ICD-10-CM | POA: Diagnosis not present

## 2022-03-18 DIAGNOSIS — I639 Cerebral infarction, unspecified: Secondary | ICD-10-CM | POA: Diagnosis not present

## 2022-03-18 DIAGNOSIS — I959 Hypotension, unspecified: Secondary | ICD-10-CM | POA: Diagnosis not present

## 2022-03-18 DIAGNOSIS — I2510 Atherosclerotic heart disease of native coronary artery without angina pectoris: Secondary | ICD-10-CM | POA: Diagnosis not present

## 2022-03-18 DIAGNOSIS — E569 Vitamin deficiency, unspecified: Secondary | ICD-10-CM | POA: Diagnosis not present

## 2022-03-18 DIAGNOSIS — S22069D Unspecified fracture of T7-T8 vertebra, subsequent encounter for fracture with routine healing: Secondary | ICD-10-CM | POA: Diagnosis not present

## 2022-03-18 DIAGNOSIS — M5030 Other cervical disc degeneration, unspecified cervical region: Secondary | ICD-10-CM | POA: Diagnosis not present

## 2022-03-18 DIAGNOSIS — R5383 Other fatigue: Secondary | ICD-10-CM | POA: Diagnosis not present

## 2022-03-18 DIAGNOSIS — R110 Nausea: Secondary | ICD-10-CM | POA: Diagnosis not present

## 2022-03-18 DIAGNOSIS — I5033 Acute on chronic diastolic (congestive) heart failure: Secondary | ICD-10-CM | POA: Diagnosis not present

## 2022-03-18 DIAGNOSIS — K219 Gastro-esophageal reflux disease without esophagitis: Secondary | ICD-10-CM | POA: Diagnosis not present

## 2022-03-18 DIAGNOSIS — Z8673 Personal history of transient ischemic attack (TIA), and cerebral infarction without residual deficits: Secondary | ICD-10-CM | POA: Diagnosis not present

## 2022-03-18 LAB — CBC
HCT: 29.7 % — ABNORMAL LOW (ref 36.0–46.0)
Hemoglobin: 10 g/dL — ABNORMAL LOW (ref 12.0–15.0)
MCH: 29.1 pg (ref 26.0–34.0)
MCHC: 33.7 g/dL (ref 30.0–36.0)
MCV: 86.3 fL (ref 80.0–100.0)
Platelets: 156 10*3/uL (ref 150–400)
RBC: 3.44 MIL/uL — ABNORMAL LOW (ref 3.87–5.11)
RDW: 13.5 % (ref 11.5–15.5)
WBC: 6 10*3/uL (ref 4.0–10.5)
nRBC: 0 % (ref 0.0–0.2)

## 2022-03-18 MED ORDER — HEPARIN SOD (PORK) LOCK FLUSH 100 UNIT/ML IV SOLN
500.0000 [IU] | Freq: Once | INTRAVENOUS | Status: AC
  Administered 2022-03-18: 500 [IU] via INTRAVENOUS
  Filled 2022-03-18: qty 5

## 2022-03-18 MED ORDER — HEPARIN SOD (PORK) LOCK FLUSH 10 UNIT/ML IV SOLN
10.0000 [IU] | Freq: Once | INTRAVENOUS | Status: DC
  Filled 2022-03-18: qty 1

## 2022-03-18 NOTE — TOC Progression Note (Signed)
Transition of Care (TOC) - Progression Note  ? ? ?Patient Details  ?Name: Kristi [name] ?MRN: [id number] ?Date of Birth: November 05, 1946 ? ?Transition of Care (TOC) CM/SW Contact  ?Pete Pelt, RN ?Phone Number: ?03/18/2022, 8:45 AM ? ?Clinical Narrative:   Patient will go to room 609 at [facility] per tammy  EMS scheduled for 1030am pick up ? ? ? ?  ?  ? ?Expected Discharge Plan and Services ?  ?  ?  ?  ?  ?                ?  ?  ?  ?  ?  ?  ?  ?  ?  ?  ? ? ?Social Determinants of Health (SDOH) Interventions ?  ? ?Readmission Risk Interventions ?   ? View : No data to display.  ?  ?  ?  ? ? ?

## 2022-03-18 NOTE — Progress Notes (Signed)
Called report to [facility] [phone]  patient going to room 609,  called multiple  no one could pick up fpr report, they said to call back in 25 minutes ?

## 2022-03-18 NOTE — Progress Notes (Signed)
Called and gave report to Yaakov Guthrie LPN at [facility] [phone] ?

## 2022-03-19 DIAGNOSIS — C3490 Malignant neoplasm of unspecified part of unspecified bronchus or lung: Secondary | ICD-10-CM | POA: Diagnosis not present

## 2022-03-19 DIAGNOSIS — I2510 Atherosclerotic heart disease of native coronary artery without angina pectoris: Secondary | ICD-10-CM | POA: Diagnosis not present

## 2022-03-19 DIAGNOSIS — Z86711 Personal history of pulmonary embolism: Secondary | ICD-10-CM | POA: Diagnosis not present

## 2022-03-19 DIAGNOSIS — I639 Cerebral infarction, unspecified: Secondary | ICD-10-CM | POA: Diagnosis not present

## 2022-03-20 DIAGNOSIS — R110 Nausea: Secondary | ICD-10-CM | POA: Diagnosis not present

## 2022-03-20 DIAGNOSIS — I959 Hypotension, unspecified: Secondary | ICD-10-CM | POA: Diagnosis not present

## 2022-03-20 DIAGNOSIS — Z86711 Personal history of pulmonary embolism: Secondary | ICD-10-CM | POA: Diagnosis not present

## 2022-03-20 DIAGNOSIS — C3490 Malignant neoplasm of unspecified part of unspecified bronchus or lung: Secondary | ICD-10-CM | POA: Diagnosis not present

## 2022-03-23 ENCOUNTER — Emergency Department
Admission: EM | Admit: 2022-03-23 | Discharge: 2022-03-24 | Disposition: A | Discharge status: Rehabilitation Facility | Payer: Medicare Other | Source: Home / Self Care | Attending: Emergency Medicine | Admitting: Emergency Medicine

## 2022-03-23 DIAGNOSIS — E782 Mixed hyperlipidemia: Secondary | ICD-10-CM | POA: Diagnosis present

## 2022-03-23 DIAGNOSIS — R0902 Hypoxemia: Secondary | ICD-10-CM | POA: Diagnosis not present

## 2022-03-23 DIAGNOSIS — S22069A Unspecified fracture of T7-T8 vertebra, initial encounter for closed fracture: Secondary | ICD-10-CM | POA: Diagnosis present

## 2022-03-23 DIAGNOSIS — E869 Volume depletion, unspecified: Secondary | ICD-10-CM | POA: Diagnosis not present

## 2022-03-23 DIAGNOSIS — I2699 Other pulmonary embolism without acute cor pulmonale: Secondary | ICD-10-CM | POA: Diagnosis not present

## 2022-03-23 DIAGNOSIS — R0600 Dyspnea, unspecified: Secondary | ICD-10-CM | POA: Diagnosis not present

## 2022-03-23 DIAGNOSIS — Z7982 Long term (current) use of aspirin: Secondary | ICD-10-CM | POA: Insufficient documentation

## 2022-03-23 DIAGNOSIS — E860 Dehydration: Secondary | ICD-10-CM | POA: Diagnosis not present

## 2022-03-23 DIAGNOSIS — M5124 Other intervertebral disc displacement, thoracic region: Secondary | ICD-10-CM | POA: Diagnosis not present

## 2022-03-23 DIAGNOSIS — Z79899 Other long term (current) drug therapy: Secondary | ICD-10-CM | POA: Insufficient documentation

## 2022-03-23 DIAGNOSIS — R488 Other symbolic dysfunctions: Secondary | ICD-10-CM | POA: Diagnosis not present

## 2022-03-23 DIAGNOSIS — E876 Hypokalemia: Secondary | ICD-10-CM | POA: Diagnosis not present

## 2022-03-23 DIAGNOSIS — N390 Urinary tract infection, site not specified: Secondary | ICD-10-CM | POA: Diagnosis not present

## 2022-03-23 DIAGNOSIS — R531 Weakness: Secondary | ICD-10-CM | POA: Diagnosis not present

## 2022-03-23 DIAGNOSIS — I2510 Atherosclerotic heart disease of native coronary artery without angina pectoris: Secondary | ICD-10-CM | POA: Diagnosis present

## 2022-03-23 DIAGNOSIS — E785 Hyperlipidemia, unspecified: Secondary | ICD-10-CM | POA: Diagnosis not present

## 2022-03-23 DIAGNOSIS — I5189 Other ill-defined heart diseases: Secondary | ICD-10-CM | POA: Diagnosis not present

## 2022-03-23 DIAGNOSIS — Z86711 Personal history of pulmonary embolism: Secondary | ICD-10-CM | POA: Diagnosis not present

## 2022-03-23 DIAGNOSIS — Z20822 Contact with and (suspected) exposure to covid-19: Secondary | ICD-10-CM | POA: Diagnosis present

## 2022-03-23 DIAGNOSIS — Z85118 Personal history of other malignant neoplasm of bronchus and lung: Secondary | ICD-10-CM | POA: Insufficient documentation

## 2022-03-23 DIAGNOSIS — M6259 Muscle wasting and atrophy, not elsewhere classified, multiple sites: Secondary | ICD-10-CM | POA: Diagnosis not present

## 2022-03-23 DIAGNOSIS — I959 Hypotension, unspecified: Secondary | ICD-10-CM | POA: Insufficient documentation

## 2022-03-23 DIAGNOSIS — Z7401 Bed confinement status: Secondary | ICD-10-CM | POA: Diagnosis not present

## 2022-03-23 DIAGNOSIS — R0689 Other abnormalities of breathing: Secondary | ICD-10-CM | POA: Diagnosis not present

## 2022-03-23 DIAGNOSIS — D630 Anemia in neoplastic disease: Secondary | ICD-10-CM | POA: Diagnosis present

## 2022-03-23 DIAGNOSIS — I38 Endocarditis, valve unspecified: Secondary | ICD-10-CM | POA: Diagnosis not present

## 2022-03-23 DIAGNOSIS — M6281 Muscle weakness (generalized): Secondary | ICD-10-CM | POA: Insufficient documentation

## 2022-03-23 DIAGNOSIS — K219 Gastro-esophageal reflux disease without esophagitis: Secondary | ICD-10-CM | POA: Diagnosis not present

## 2022-03-23 DIAGNOSIS — Z736 Limitation of activities due to disability: Secondary | ICD-10-CM | POA: Diagnosis not present

## 2022-03-23 DIAGNOSIS — R7401 Elevation of levels of liver transaminase levels: Secondary | ICD-10-CM | POA: Diagnosis present

## 2022-03-23 DIAGNOSIS — E440 Moderate protein-calorie malnutrition: Secondary | ICD-10-CM | POA: Diagnosis not present

## 2022-03-23 DIAGNOSIS — R000 Tachycardia, unspecified: Secondary | ICD-10-CM | POA: Diagnosis not present

## 2022-03-23 DIAGNOSIS — I252 Old myocardial infarction: Secondary | ICD-10-CM | POA: Diagnosis not present

## 2022-03-23 DIAGNOSIS — B955 Unspecified streptococcus as the cause of diseases classified elsewhere: Secondary | ICD-10-CM | POA: Diagnosis not present

## 2022-03-23 DIAGNOSIS — I5032 Chronic diastolic (congestive) heart failure: Secondary | ICD-10-CM | POA: Insufficient documentation

## 2022-03-23 DIAGNOSIS — C3490 Malignant neoplasm of unspecified part of unspecified bronchus or lung: Secondary | ICD-10-CM | POA: Diagnosis not present

## 2022-03-23 DIAGNOSIS — Z923 Personal history of irradiation: Secondary | ICD-10-CM | POA: Diagnosis not present

## 2022-03-23 DIAGNOSIS — N179 Acute kidney failure, unspecified: Secondary | ICD-10-CM | POA: Diagnosis present

## 2022-03-23 DIAGNOSIS — G8929 Other chronic pain: Secondary | ICD-10-CM | POA: Diagnosis present

## 2022-03-23 DIAGNOSIS — I110 Hypertensive heart disease with heart failure: Secondary | ICD-10-CM | POA: Diagnosis present

## 2022-03-23 DIAGNOSIS — G3184 Mild cognitive impairment, so stated: Secondary | ICD-10-CM | POA: Diagnosis present

## 2022-03-23 DIAGNOSIS — J9811 Atelectasis: Secondary | ICD-10-CM | POA: Diagnosis not present

## 2022-03-23 DIAGNOSIS — R5383 Other fatigue: Secondary | ICD-10-CM | POA: Diagnosis not present

## 2022-03-23 DIAGNOSIS — R7881 Bacteremia: Secondary | ICD-10-CM | POA: Diagnosis not present

## 2022-03-23 DIAGNOSIS — E871 Hypo-osmolality and hyponatremia: Secondary | ICD-10-CM | POA: Diagnosis not present

## 2022-03-23 DIAGNOSIS — I700 Atherosclerosis of aorta: Secondary | ICD-10-CM | POA: Diagnosis not present

## 2022-03-23 DIAGNOSIS — R109 Unspecified abdominal pain: Secondary | ICD-10-CM | POA: Diagnosis not present

## 2022-03-23 DIAGNOSIS — R2681 Unsteadiness on feet: Secondary | ICD-10-CM | POA: Diagnosis not present

## 2022-03-23 DIAGNOSIS — K5900 Constipation, unspecified: Secondary | ICD-10-CM | POA: Diagnosis not present

## 2022-03-23 DIAGNOSIS — Z66 Do not resuscitate: Secondary | ICD-10-CM | POA: Diagnosis present

## 2022-03-23 DIAGNOSIS — C7931 Secondary malignant neoplasm of brain: Secondary | ICD-10-CM | POA: Diagnosis present

## 2022-03-23 DIAGNOSIS — S22069D Unspecified fracture of T7-T8 vertebra, subsequent encounter for fracture with routine healing: Secondary | ICD-10-CM | POA: Diagnosis not present

## 2022-03-24 ENCOUNTER — Other Ambulatory Visit: Payer: Self-pay

## 2022-03-24 ENCOUNTER — Emergency Department: Payer: Medicare Other

## 2022-03-24 ENCOUNTER — Encounter: Payer: Self-pay | Admitting: Emergency Medicine

## 2022-03-24 ENCOUNTER — Inpatient Hospital Stay
Admission: EM | Admit: 2022-03-24 | Discharge: 2022-03-31 | DRG: 872 | Disposition: A | Discharge status: Home Health | Payer: Medicare Other | Attending: Internal Medicine | Admitting: Internal Medicine

## 2022-03-24 DIAGNOSIS — R0902 Hypoxemia: Secondary | ICD-10-CM | POA: Diagnosis not present

## 2022-03-24 DIAGNOSIS — Z87891 Personal history of nicotine dependence: Secondary | ICD-10-CM

## 2022-03-24 DIAGNOSIS — N179 Acute kidney failure, unspecified: Secondary | ICD-10-CM | POA: Diagnosis present

## 2022-03-24 DIAGNOSIS — R0600 Dyspnea, unspecified: Secondary | ICD-10-CM | POA: Diagnosis not present

## 2022-03-24 DIAGNOSIS — I5189 Other ill-defined heart diseases: Secondary | ICD-10-CM

## 2022-03-24 DIAGNOSIS — G8929 Other chronic pain: Secondary | ICD-10-CM | POA: Diagnosis present

## 2022-03-24 DIAGNOSIS — Z7982 Long term (current) use of aspirin: Secondary | ICD-10-CM

## 2022-03-24 DIAGNOSIS — E782 Mixed hyperlipidemia: Secondary | ICD-10-CM | POA: Diagnosis present

## 2022-03-24 DIAGNOSIS — K5900 Constipation, unspecified: Secondary | ICD-10-CM | POA: Diagnosis not present

## 2022-03-24 DIAGNOSIS — E785 Hyperlipidemia, unspecified: Secondary | ICD-10-CM | POA: Diagnosis present

## 2022-03-24 DIAGNOSIS — I700 Atherosclerosis of aorta: Secondary | ICD-10-CM | POA: Diagnosis present

## 2022-03-24 DIAGNOSIS — E871 Hypo-osmolality and hyponatremia: Secondary | ICD-10-CM | POA: Diagnosis not present

## 2022-03-24 DIAGNOSIS — G3184 Mild cognitive impairment, so stated: Secondary | ICD-10-CM | POA: Diagnosis present

## 2022-03-24 DIAGNOSIS — S22069A Unspecified fracture of T7-T8 vertebra, initial encounter for closed fracture: Secondary | ICD-10-CM | POA: Diagnosis present

## 2022-03-24 DIAGNOSIS — J9811 Atelectasis: Secondary | ICD-10-CM | POA: Diagnosis not present

## 2022-03-24 DIAGNOSIS — R7401 Elevation of levels of liver transaminase levels: Secondary | ICD-10-CM | POA: Diagnosis present

## 2022-03-24 DIAGNOSIS — R531 Weakness: Secondary | ICD-10-CM | POA: Diagnosis not present

## 2022-03-24 DIAGNOSIS — D630 Anemia in neoplastic disease: Secondary | ICD-10-CM | POA: Diagnosis present

## 2022-03-24 DIAGNOSIS — B955 Unspecified streptococcus as the cause of diseases classified elsewhere: Secondary | ICD-10-CM | POA: Diagnosis present

## 2022-03-24 DIAGNOSIS — C7931 Secondary malignant neoplasm of brain: Secondary | ICD-10-CM | POA: Diagnosis present

## 2022-03-24 DIAGNOSIS — I959 Hypotension, unspecified: Secondary | ICD-10-CM | POA: Diagnosis not present

## 2022-03-24 DIAGNOSIS — E860 Dehydration: Secondary | ICD-10-CM | POA: Diagnosis present

## 2022-03-24 DIAGNOSIS — E869 Volume depletion, unspecified: Secondary | ICD-10-CM | POA: Diagnosis not present

## 2022-03-24 DIAGNOSIS — I252 Old myocardial infarction: Secondary | ICD-10-CM

## 2022-03-24 DIAGNOSIS — C3490 Malignant neoplasm of unspecified part of unspecified bronchus or lung: Secondary | ICD-10-CM | POA: Diagnosis present

## 2022-03-24 DIAGNOSIS — Z20822 Contact with and (suspected) exposure to covid-19: Secondary | ICD-10-CM | POA: Diagnosis present

## 2022-03-24 DIAGNOSIS — I2510 Atherosclerotic heart disease of native coronary artery without angina pectoris: Secondary | ICD-10-CM | POA: Diagnosis present

## 2022-03-24 DIAGNOSIS — R109 Unspecified abdominal pain: Secondary | ICD-10-CM | POA: Diagnosis not present

## 2022-03-24 DIAGNOSIS — E876 Hypokalemia: Secondary | ICD-10-CM | POA: Diagnosis present

## 2022-03-24 DIAGNOSIS — Z9221 Personal history of antineoplastic chemotherapy: Secondary | ICD-10-CM

## 2022-03-24 DIAGNOSIS — I5032 Chronic diastolic (congestive) heart failure: Secondary | ICD-10-CM | POA: Diagnosis present

## 2022-03-24 DIAGNOSIS — I110 Hypertensive heart disease with heart failure: Secondary | ICD-10-CM | POA: Diagnosis present

## 2022-03-24 DIAGNOSIS — R0689 Other abnormalities of breathing: Secondary | ICD-10-CM | POA: Diagnosis not present

## 2022-03-24 DIAGNOSIS — Z79899 Other long term (current) drug therapy: Secondary | ICD-10-CM

## 2022-03-24 DIAGNOSIS — K219 Gastro-esophageal reflux disease without esophagitis: Secondary | ICD-10-CM | POA: Diagnosis present

## 2022-03-24 DIAGNOSIS — Z66 Do not resuscitate: Secondary | ICD-10-CM | POA: Diagnosis present

## 2022-03-24 DIAGNOSIS — Z9049 Acquired absence of other specified parts of digestive tract: Secondary | ICD-10-CM

## 2022-03-24 DIAGNOSIS — N390 Urinary tract infection, site not specified: Secondary | ICD-10-CM | POA: Diagnosis not present

## 2022-03-24 DIAGNOSIS — R7881 Bacteremia: Principal | ICD-10-CM | POA: Diagnosis present

## 2022-03-24 DIAGNOSIS — Z7901 Long term (current) use of anticoagulants: Secondary | ICD-10-CM

## 2022-03-24 DIAGNOSIS — Z79891 Long term (current) use of opiate analgesic: Secondary | ICD-10-CM

## 2022-03-24 DIAGNOSIS — Z86711 Personal history of pulmonary embolism: Secondary | ICD-10-CM

## 2022-03-24 DIAGNOSIS — Z8673 Personal history of transient ischemic attack (TIA), and cerebral infarction without residual deficits: Secondary | ICD-10-CM

## 2022-03-24 DIAGNOSIS — Z955 Presence of coronary angioplasty implant and graft: Secondary | ICD-10-CM

## 2022-03-24 DIAGNOSIS — Z923 Personal history of irradiation: Secondary | ICD-10-CM

## 2022-03-24 DIAGNOSIS — Z7401 Bed confinement status: Secondary | ICD-10-CM | POA: Diagnosis not present

## 2022-03-24 DIAGNOSIS — I2699 Other pulmonary embolism without acute cor pulmonale: Secondary | ICD-10-CM | POA: Diagnosis present

## 2022-03-24 LAB — BLOOD CULTURE ID PANEL (REFLEXED) - BCID2

## 2022-03-24 LAB — CBC WITH DIFFERENTIAL/PLATELET
Abs Immature Granulocytes: 0.05 10*3/uL (ref 0.00–0.07)
Abs Immature Granulocytes: 0.05 10*3/uL (ref 0.00–0.07)
Basophils Absolute: 0 10*3/uL (ref 0.0–0.1)
Basophils Absolute: 0.1 10*3/uL (ref 0.0–0.1)
Basophils Relative: 1 %
Basophils Relative: 1 %
Eosinophils Absolute: 0.3 10*3/uL (ref 0.0–0.5)
Eosinophils Absolute: 0.3 10*3/uL (ref 0.0–0.5)
Eosinophils Relative: 6 %
Eosinophils Relative: 5 %
HCT: 29.1 % — ABNORMAL LOW (ref 36.0–46.0)
HCT: 31.4 % — ABNORMAL LOW (ref 36.0–46.0)
Hemoglobin: 9.4 g/dL — ABNORMAL LOW (ref 12.0–15.0)
Hemoglobin: 10.2 g/dL — ABNORMAL LOW (ref 12.0–15.0)
Immature Granulocytes: 1 %
Immature Granulocytes: 1 %
Lymphocytes Relative: 20 %
Lymphocytes Relative: 23 %
Lymphs Abs: 1.1 10*3/uL (ref 0.7–4.0)
Lymphs Abs: 1.4 10*3/uL (ref 0.7–4.0)
MCH: 28.8 pg (ref 26.0–34.0)
MCH: 29.1 pg (ref 26.0–34.0)
MCHC: 32.3 g/dL (ref 30.0–36.0)
MCHC: 32.5 g/dL (ref 30.0–36.0)
MCV: 89.3 fL (ref 80.0–100.0)
MCV: 89.5 fL (ref 80.0–100.0)
Monocytes Absolute: 0.8 10*3/uL (ref 0.1–1.0)
Monocytes Absolute: 0.8 10*3/uL (ref 0.1–1.0)
Monocytes Relative: 15 %
Monocytes Relative: 14 %
Neutro Abs: 3.2 10*3/uL (ref 1.7–7.7)
Neutro Abs: 3.4 10*3/uL (ref 1.7–7.7)
Neutrophils Relative %: 57 %
Neutrophils Relative %: 56 %
Platelets: 255 10*3/uL (ref 150–400)
Platelets: 259 10*3/uL (ref 150–400)
RBC: 3.26 MIL/uL — ABNORMAL LOW (ref 3.87–5.11)
RBC: 3.51 MIL/uL — ABNORMAL LOW (ref 3.87–5.11)
RDW: 14.3 % (ref 11.5–15.5)
RDW: 14.7 % (ref 11.5–15.5)
WBC: 5.5 10*3/uL (ref 4.0–10.5)
WBC: 6 10*3/uL (ref 4.0–10.5)
nRBC: 0 % (ref 0.0–0.2)
nRBC: 0 % (ref 0.0–0.2)

## 2022-03-24 LAB — COMPREHENSIVE METABOLIC PANEL
ALT: 58 U/L — ABNORMAL HIGH (ref 0–44)
ALT: 55 U/L — ABNORMAL HIGH (ref 0–44)
AST: 78 U/L — ABNORMAL HIGH (ref 15–41)
AST: 75 U/L — ABNORMAL HIGH (ref 15–41)
Albumin: 3.3 g/dL — ABNORMAL LOW (ref 3.5–5.0)
Albumin: 3.4 g/dL — ABNORMAL LOW (ref 3.5–5.0)
Alkaline Phosphatase: 98 U/L (ref 38–126)
Alkaline Phosphatase: 97 U/L (ref 38–126)
Anion gap: 7 (ref 5–15)
Anion gap: 8 (ref 5–15)
BUN: 12 mg/dL (ref 8–23)
BUN: 11 mg/dL (ref 8–23)
CO2: 24 mmol/L (ref 22–32)
CO2: 23 mmol/L (ref 22–32)
Calcium: 8.6 mg/dL — ABNORMAL LOW (ref 8.9–10.3)
Calcium: 9.4 mg/dL (ref 8.9–10.3)
Chloride: 110 mmol/L (ref 98–111)
Chloride: 109 mmol/L (ref 98–111)
Creatinine, Ser: 1.07 mg/dL — ABNORMAL HIGH (ref 0.44–1.00)
Creatinine, Ser: 1.1 mg/dL — ABNORMAL HIGH (ref 0.44–1.00)
GFR, Estimated: 54 mL/min — ABNORMAL LOW (ref >60)
GFR, Estimated: 52 mL/min — ABNORMAL LOW (ref >60)
Glucose, Bld: 101 mg/dL — ABNORMAL HIGH (ref 70–99)
Glucose, Bld: 119 mg/dL — ABNORMAL HIGH (ref 70–99)
Potassium: 2.8 mmol/L — ABNORMAL LOW (ref 3.5–5.1)
Potassium: 3.4 mmol/L — ABNORMAL LOW (ref 3.5–5.1)
Sodium: 141 mmol/L (ref 135–145)
Sodium: 140 mmol/L (ref 135–145)
Total Bilirubin: 0.5 mg/dL (ref 0.3–1.2)
Total Bilirubin: 0.5 mg/dL (ref 0.3–1.2)
Total Protein: 6.6 g/dL (ref 6.5–8.1)
Total Protein: 7.3 g/dL (ref 6.5–8.1)

## 2022-03-24 LAB — URINALYSIS, ROUTINE W REFLEX MICROSCOPIC
Bilirubin Urine: NEGATIVE
Glucose, UA: NEGATIVE mg/dL
Ketones, ur: NEGATIVE mg/dL
Nitrite: POSITIVE — AB
Protein, ur: NEGATIVE mg/dL
Specific Gravity, Urine: 1.008 (ref 1.005–1.030)
pH: 6 (ref 5.0–8.0)

## 2022-03-24 LAB — LACTIC ACID, PLASMA
Lactic Acid, Venous: 1.1 mmol/L (ref 0.5–1.9)
Lactic Acid, Venous: 1.1 mmol/L (ref 0.5–1.9)

## 2022-03-24 LAB — RESP PANEL BY RT-PCR (FLU A&B, COVID) ARPGX2
Influenza A by PCR: NEGATIVE
Influenza B by PCR: NEGATIVE
SARS Coronavirus 2 by RT PCR: NEGATIVE

## 2022-03-24 LAB — TROPONIN I (HIGH SENSITIVITY)
Troponin I (High Sensitivity): 6 ng/L (ref <18)
Troponin I (High Sensitivity): 6 ng/L (ref <18)

## 2022-03-24 MED ORDER — IOHEXOL 300 MG/ML  SOLN
100.0000 mL | Freq: Once | INTRAMUSCULAR | Status: AC | PRN
  Administered 2022-03-24: 100 mL via INTRAVENOUS

## 2022-03-24 MED ORDER — SODIUM CHLORIDE 0.9 % IV BOLUS
500.0000 mL | Freq: Once | INTRAVENOUS | Status: AC
  Administered 2022-03-24: 500 mL via INTRAVENOUS

## 2022-03-24 MED ORDER — POTASSIUM CHLORIDE CRYS ER 20 MEQ PO TBCR
60.0000 meq | EXTENDED_RELEASE_TABLET | Freq: Once | ORAL | Status: AC
Start: 2022-03-24 — End: 2022-03-24
  Administered 2022-03-24: 60 meq via ORAL
  Filled 2022-03-24: qty 3

## 2022-03-24 MED ORDER — SODIUM CHLORIDE 0.9 % IV SOLN
1.0000 g | Freq: Once | INTRAVENOUS | Status: AC
  Administered 2022-03-24: 1 g via INTRAVENOUS

## 2022-03-24 MED ORDER — SODIUM CHLORIDE 0.9 % IV BOLUS
1000.0000 mL | Freq: Once | INTRAVENOUS | Status: AC
  Administered 2022-03-24: 1000 mL via INTRAVENOUS

## 2022-03-24 MED ORDER — CEPHALEXIN 250 MG PO CAPS: 250.0000 mg | ORAL_CAPSULE | Freq: Three times a day (TID) | ORAL | 0 refills | Status: DC

## 2022-03-24 MED ORDER — VANCOMYCIN HCL 1500 MG/300ML IV SOLN
1500.0000 mg | Freq: Once | INTRAVENOUS | Status: AC
  Administered 2022-03-24: 1500 mg via INTRAVENOUS
  Filled 2022-03-24: qty 300

## 2022-03-24 MED ORDER — SODIUM CHLORIDE 0.9 % IV SOLN
2.0000 g | Freq: Once | INTRAVENOUS | Status: AC
  Administered 2022-03-24: 2 g via INTRAVENOUS
  Filled 2022-03-24: qty 20

## 2022-03-24 NOTE — ED Provider Notes (Signed)
? ?[facility] ?Provider Note ? ? ? Event Date/Time  ? First MD Initiated Contact with Patient 03/23/22 2359   ?  (approximate) ? ? ?History  ? ?Weakness ? ? ?HPI ? ?Kristi [name] is a 76 y.o. female brought to the ED via EMS from SNF with a chief complaint of generalized weakness and hypotension.  Patient reports she generally feels weak all over and endorses generalized malaise.  SNF reports to EMS that patient had a blood pressure systolic in the 99B.  EMS obtained blood pressure 110/65.  Patient denies fever, cough, chest pain, shortness of breath, abdominal pain, nausea, vomiting, dysuria or dizziness.  Endorses chronic back pain, history of kyphoplasty. ?  ? ? ?Past Medical History  ? ?Past Medical History:  ?Diagnosis Date  ? Acute MI, inferoposterior wall ([place]) 09/30/2014  ? 1 stent  ? Claustrophobia   ? Coronary artery disease   ? GERD (gastroesophageal reflux disease)   ? Hypercholesteremia   ? MI, old   ? Pneumonia   ? Small cell lung cancer ([place])   ? 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 [facility]) 10/27/2018  ? ? ? ?Active Problem List  ? ?Patient Active Problem List  ? Diagnosis Date Noted  ? Sleep disturbance 03/16/2022  ? Malnutrition of moderate degree 03/14/2022  ? Hypotension 03/14/2022  ? Acute pulmonary embolism ([place]) 03/13/2022  ? 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 Closed T7 fracture ([place]) 03/13/2022  ? Leg weakness, bilateral 03/12/2022  ? Coronary artery disease involving native coronary artery of native heart without angina pectoris 03/12/2022  ? Chronic diastolic CHF (congestive heart failure) ([place]) 03/12/2022  ? GERD without esophagitis 03/12/2022  ? Cerebrovascular accident (CVA) ([place]) 12/19/2021  ? Brain ischemia 09/29/2021  ? MCI (mild cognitive impairment) 06/19/2021  ? Aortic atherosclerosis ([place]) 06/18/2021  ? Incisional hernia, without obstruction or gangrene   ? Thrombocytopenia ([place]) 02/12/2021  ? 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 Diarrhea 12/05/2020  ? Osteopenia 08/12/2020  ? Ventral hernia without obstruction or gangrene 08/12/2020  ? Thoracic compression fracture, closed, initial encounter ([place]) 01/22/2020  ? Frequent falls 01/22/2020  ? Encounter for antineoplastic immunotherapy 09/14/2019  ? Memory loss 09/14/2019  ? Other fatigue 09/14/2019  ? Cancer of hilus of left lung ([place]) 06/08/2019  ? Dehydration 01/30/2019  ? Anemia due to antineoplastic chemotherapy 01/09/2019  ? Encounter for antineoplastic chemotherapy 11/02/2018  ? Cough 10/28/2018  ? Dysphagia 10/28/2018  ? Decrease in appetite 10/28/2018  ? Goals of care, counseling/discussion 10/28/2018  ? Small cell lung cancer ([place]) 10/27/2018  ? Grade I diastolic dysfunction 71/69/6789  ? Localized edema 08/29/2018  ? Cholecystitis 08/13/2018  ? Osteopenia determined by x-ray 05/19/2018  ? Prediabetes 02/08/2018  ? Hx of fracture of radius 08/18/2016  ? Vitamin D deficiency 01/07/2016  ? Mixed hyperlipidemia 01/02/2016  ? Chronic pain 01/02/2016  ? Coronary artery disease with stable angina pectoris ([place]) 01/02/2016  ? IBS (irritable bowel syndrome) 01/02/2016  ? FH: colon cancer 01/02/2016  ? DDD (degenerative disc disease), cervical 01/02/2016  ? Primary osteoarthritis of left knee 01/02/2016  ? Tobacco use disorder 01/02/2016  ? ? ? ?Past Surgical History  ? ?Past Surgical History:  ?Procedure Laterality Date  ? APPENDECTOMY    ? 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  ? BLADDER NECK SUSPENSION    ? CHOLECYSTECTOMY N/A 08/14/2018  ? Procedure: LAPAROSCOPIC CHOLECYSTECTOMY;  Surgeon: Jules Husbands, MD;  Location: [facility] ORS;  Service: General;  Laterality: N/A;  ? CHOLECYSTECTOMY  11/2018  ? CORONARY ANGIOPLASTY  09/29/2014  ? CORONARY STENT PLACEMENT    ? ENDOBRONCHIAL ULTRASOUND N/A 10/21/2018  ? Procedure: ENDOBRONCHIAL ULTRASOUND;  Surgeon: Laverle Hobby, MD;  Location: [facility] ORS;  Service: Pulmonary;  Laterality: N/A;  ? HERNIA REPAIR  2012  ? IR FLUORO GUIDE CV LINE RIGHT   12/19/2018  ? KYPHOPLASTY N/A 03/01/2020  ? Procedure: T7 KYPHOPLASTY;  Surgeon: Hessie Knows, MD;  Location: [facility] ORS;  Service: Orthopedics;  Laterality: N/A;  ? LAPAROTOMY    ? LEFT HEART CATHETERIZATION WITH CORONARY ANGIOGRAM N/A 09/29/2014  ? Procedure: LEFT HEART CATHETERIZATION WITH CORONARY ANGIOGRAM;  Surgeon: Clent Demark, MD;  Location: [facility] CATH LAB;  Service: Cardiovascular;  Laterality: N/A;  ? PORTA CATH INSERTION N/A 01/02/2019  ? Procedure: PORTA CATH INSERTION;  Surgeon: Algernon Huxley, MD;  Location: [facility] CV LAB;  Service: Cardiovascular;  Laterality: N/A;  ? PORTACATH PLACEMENT Right 10/28/2018  ? Procedure: INSERTION PORT-A-CATH;  Surgeon: Jules Husbands, MD;  Location: [facility] ORS;  Service: General;  Laterality: Right;  ? XI ROBOTIC ASSISTED VENTRAL HERNIA N/A 02/27/2021  ? Procedure: XI ROBOTIC ASSISTED VENTRAL HERNIA (incisional) WITH MESH;  Surgeon: Olean Ree, MD;  Location: [facility] ORS;  Service: General;  Laterality: N/A;  ? ? ? ?Home Medications  ? ?Prior to Admission medications   ?Medication Sig Start Date End Date Taking? Authorizing Provider  ?acetaminophen (TYLENOL) 500 MG tablet Take 2 tablets (1,000 mg total) by mouth every 6 (six) hours as needed for mild pain or headache. 02/27/21  Yes Piscoya, Jose, MD  ?aspirin EC 81 MG tablet Take 1 tablet (81 mg total) by mouth daily. Swallow whole. ?Patient taking differently: Take 325 mg by mouth daily. Swallow whole. 03/17/22 03/17/23 Yes Annita Brod, MD  ?atorvastatin (LIPITOR) 40 MG tablet Take 1 tablet (40 mg total) by mouth daily. 12/19/21  Yes Vaslow, Acey Lav, MD  ?Calcium 600-200 MG-UNIT tablet Take 1 tablet by mouth 2 (two) times daily.   Yes [provider]  ?cephALEXin (KEFLEX) 250 MG capsule Take 1 capsule (250 mg total) by mouth 3 (three) times daily. 03/24/22  Yes Paulette Blanch, MD  ?Cholecalciferol (DIALYVITE VITAMIN D 5000) 125 MCG (5000 UT) capsule Take 5,000 Units by mouth daily.   Yes [provider]  ?diphenhydrAMINE (BENADRYL) 25 mg capsule Take 1 capsule (25 mg total) by mouth at bedtime as needed (if no sleep even with trazodone). 03/17/22  Yes Annita Brod, MD  ?lidocaine (LIDODERM) 5 % Place 1 patch onto the skin daily. Remove & Discard patch within 12 hours or as directed by MD 03/18/22  Yes Annita Brod, MD  ?lidocaine-prilocaine (EMLA) cream Apply 1 application topically daily as needed (port access). 04/15/20  Yes Borders, Kirt Boys, NP  ?Magnesium 250 MG TABS Take 250 mg by mouth daily.   Yes [provider]  ?ondansetron (ZOFRAN) 8 MG tablet Take 1 tablet (8 mg total) by mouth every 8 (eight) hours as needed for nausea or vomiting. 12/01/21  Yes Earlie Server, MD  ?pantoprazole (PROTONIX) 40 MG tablet Take 1 tablet (40 mg total) by mouth 2 (two) times daily. 04/02/21  Yes Earlie Server, MD  ?sertraline (ZOLOFT) 100 MG tablet Take 1 tablet (100 mg total) by mouth daily. 03/31/21  Yes Borders, Kirt Boys, NP  ?sucralfate (CARAFATE) 1 g tablet Take 1 g by mouth 3 (three) times daily as needed (heartburn).  Yes [provider]  ?vitamin B-12 (CYANOCOBALAMIN) 1000 MCG tablet Take 1,000 mcg by mouth daily.   Yes [provider]  ?alum & mag hydroxide-simeth (MAALOX/MYLANTA) 200-200-20 MG/5ML suspension Take 30 mLs by mouth every 6 (six) hours as needed for indigestion or heartburn. ?Patient not taking: Reported on 03/24/2022 03/17/22   Annita Brod, MD  ?oxyCODONE (ROXICODONE) 5 MG immediate release tablet Take 1 tablet (5 mg total) by mouth every 12 (twelve) hours as needed for severe pain. ?Patient not taking: Reported on 03/24/2022 03/17/22 03/17/23  Annita Brod, MD  ?polyethylene glycol (MIRALAX / GLYCOLAX) 17 g packet Take 17 g by mouth daily as needed for mild constipation. ?Patient not taking: Reported on 03/24/2022 03/17/22   Annita Brod, MD  ?prochlorperazine (COMPAZINE) 10 MG tablet Take 10 mg by mouth every 6 (six) hours as needed for nausea or  vomiting. ?Patient not taking: Reported on 03/24/2022    [provider]  ?Rivaroxaban (XARELTO) 15 MG TABS tablet Take 1 tablet (15 mg total) by mouth 2 (two) times daily with a meal for 18 days. 03/17/22 04/04/22

## 2022-03-24 NOTE — ED Notes (Signed)
[facility] to transport to [facility] ?

## 2022-03-24 NOTE — ED Provider Notes (Signed)
? ?[facility] ?Provider Note ? ? ? Event Date/Time  ? First MD Initiated Contact with Patient 03/24/22 2215   ?  (approximate) ? ? ?History  ? ?Follow-up ? ? ?HPI ? ?Kristi [name] is a 76 y.o. female with past medical history of coronary disease, small cell lung cancer, hyperlipidemia, GERD who presents with a positive blood culture.  Seen yesterday in the ED due to generalized weakness and hypotension.  Patient had blood cultures drawn and ultimately had a reassuring work-up and was given a dose of IV Rocephin for possible UTI and discharge.  Blood culture now growing positive strep so patient referred back to the ED.  [name] is generalized malaise fatigue and inability to ambulate over the past day due to generalized weakness.  She has mild cough but denies dyspnea no fever.  Did have 2 episodes of vomiting earlier in the week denies diarrhea.  Endorses mild lower abdominal discomfort.  She denies skin or soft tissue infection.  Denies abnormal heart valves or history of heart surgery. ? ?  ? ?Past Medical History:  ?Diagnosis Date  ? Acute MI, inferoposterior wall ([place]) 09/30/2014  ? 1 stent  ? Claustrophobia   ? Coronary artery disease   ? GERD (gastroesophageal reflux disease)   ? Hypercholesteremia   ? MI, old   ? Pneumonia   ? Small cell lung cancer ([place])   ? 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 [facility]) 10/27/2018  ? ? ?Patient Active Problem List  ? Diagnosis Date Noted  ? Sleep disturbance 03/16/2022  ? Malnutrition of moderate degree 03/14/2022  ? Hypotension 03/14/2022  ? Acute pulmonary embolism ([place]) 03/13/2022  ? 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 Closed T7 fracture ([place]) 03/13/2022  ? Leg weakness, bilateral 03/12/2022  ? Coronary artery disease involving native coronary artery of native heart without angina pectoris 03/12/2022  ? Chronic diastolic CHF (congestive heart failure) ([place]) 03/12/2022  ? GERD without esophagitis 03/12/2022  ? Cerebrovascular  accident (CVA) ([place]) 12/19/2021  ? Brain ischemia 09/29/2021  ? MCI (mild cognitive impairment) 06/19/2021  ? Aortic atherosclerosis ([place]) 06/18/2021  ? Incisional hernia, without obstruction or gangrene   ? Thrombocytopenia ([place]) 02/12/2021  ? 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 Diarrhea 12/05/2020  ? Osteopenia 08/12/2020  ? Ventral hernia without obstruction or gangrene 08/12/2020  ? Thoracic compression fracture, closed, initial encounter ([place]) 01/22/2020  ? Frequent falls 01/22/2020  ? Encounter for antineoplastic immunotherapy 09/14/2019  ? Memory loss 09/14/2019  ? Other fatigue 09/14/2019  ? Cancer of hilus of left lung ([place]) 06/08/2019  ? Dehydration 01/30/2019  ? Anemia due to antineoplastic chemotherapy 01/09/2019  ? Encounter for antineoplastic chemotherapy 11/02/2018  ? Cough 10/28/2018  ? Dysphagia 10/28/2018  ? Decrease in appetite 10/28/2018  ? Goals of care, counseling/discussion 10/28/2018  ? Small cell lung cancer ([place]) 10/27/2018  ? Grade I diastolic dysfunction 37/85/8850  ? Localized edema 08/29/2018  ? Cholecystitis 08/13/2018  ? Osteopenia determined by x-ray 05/19/2018  ? Prediabetes 02/08/2018  ? Hx of fracture of radius 08/18/2016  ? Vitamin D deficiency 01/07/2016  ? Mixed hyperlipidemia 01/02/2016  ? Chronic pain 01/02/2016  ? Coronary artery disease with stable angina pectoris ([place]) 01/02/2016  ? IBS (irritable bowel syndrome) 01/02/2016  ? FH: colon cancer 01/02/2016  ? DDD (degenerative disc disease), cervical 01/02/2016  ? Primary osteoarthritis of left knee 01/02/2016  ? Tobacco use disorder 01/02/2016  ? ? ? ?Physical Exam  ?  Triage Vital Signs: ?ED Triage Vitals [03/24/22 2152]  ?Enc Vitals Group  ?   BP 95/73  ?   Pulse Rate 89  ?   Resp 15  ?   Temp 98.1 ?F (36.7 ?C)  ?   Temp Source Oral  ?   SpO2 98 %  ?   Weight   ?   Height   ?   Head Circumference   ?   Peak Flow   ?   Pain Score   ?   Pain Loc   ?   Pain Edu?   ?   Excl. in [place]?   ? ? ?Most recent vital  signs: ?Vitals:  ? 03/24/22 2152 03/24/22 2342  ?BP: 95/73 (!) 126/98  ?Pulse: 89 83  ?Resp: 15 16  ?Temp: 98.1 ?F (36.7 ?C)   ?SpO2: 98% 100%  ? ? ? ?General: Awake, no distress.  ?CV:  Good peripheral perfusion. No edema ?Resp:  Normal effort.  ?Abd:  No distention. Mild ttp in the LLQ ?Neuro:             Awake, Alert, Oriented x 3  ?Other:   ? ? ?ED Results / Procedures / Treatments  ?Labs ?(all labs ordered are listed, but only abnormal results are displayed) ?Labs Reviewed  ?COMPREHENSIVE METABOLIC PANEL - Abnormal; Notable for the following components:  ?    Result Value  ? Potassium 3.4 (*)   ? Glucose, Bld 119 (*)   ? Creatinine, Ser 1.10 (*)   ? Albumin 3.4 (*)   ? AST 75 (*)   ? ALT 55 (*)   ? GFR, Estimated 52 (*)   ? All other components within normal limits  ?CBC WITH DIFFERENTIAL/PLATELET - Abnormal; Notable for the following components:  ? RBC 3.51 (*)   ? Hemoglobin 10.2 (*)   ? HCT 31.4 (*)   ? All other components within normal limits  ?CULTURE, BLOOD (ROUTINE X 2)  ?CULTURE, BLOOD (ROUTINE X 2)  ?LACTIC ACID, PLASMA  ?LACTIC ACID, PLASMA  ?URINALYSIS, COMPLETE (UACMP) WITH MICROSCOPIC  ? ? ? ?EKG ? ? ? ? ?RADIOLOGY ? ?I reviewed the CXR which does not show any acute cardiopulmonary process; agree with radiology report  ? ? ? ?PROCEDURES: ? ?Critical Care performed: Yes, see critical care procedure note(s) ? ?Procedures ? ?The patient is on the cardiac monitor to evaluate for evidence of arrhythmia and/or significant heart rate changes. ? ? ?MEDICATIONS ORDERED IN ED: ?Medications  ?vancomycin (VANCOREADY) IVPB 1500 mg/300 mL (has no administration in time range)  ?sodium chloride 0.9 % bolus 1,000 mL (1,000 mLs Intravenous New Bag/Given 03/24/22 2236)  ?cefTRIAXone (ROCEPHIN) 2 g in sodium chloride 0.9 % 100 mL IVPB (0 g Intravenous Stopped 03/24/22 2341)  ?iohexol (OMNIPAQUE) 300 MG/ML solution 100 mL (100 mLs Intravenous Contrast Given 03/24/22 2305)  ? ? ? ?IMPRESSION / MDM / ASSESSMENT AND PLAN / ED  COURSE  ?I reviewed the triage vital signs and the nursing notes. ?             ?               ? ?Differential diagnosis includes, but is not limited to, bacteremia secondary to endocarditis, UTI, pneumonia, contaminant ?Patient is a 76 year old female presents with positive blood cultures.  Seen yesterday in ED for hypotension and generalized fatigue malaise diagnosed with UTI given dose of Rocephin and discharged.  Blood cultures today growing strep and anaerobic and aerobic bottle.  Patient with no new complaints today continues to feel quite fatigued generally weak.  Has mild cough mild abdominal pain.  On exam appears well.  Initial blood pressure somewhat soft in the 75Q systolic but improved to 492E over 90s after a liter of fluid.  No obvious pain or soft tissue infection.  Abs today do not show any leukocytosis, LFTs mildly elevated which is stable from prior normal lactate.  Given the positive blood cultures will give a dose of ceftriaxone Rocephin redraw cultures and admit to the hospitalist service. ? ?  ? ? ?FINAL CLINICAL IMPRESSION(S) / ED DIAGNOSES  ? ?Final diagnoses:  ?Bacteremia  ? ? ? ?Rx / DC Orders  ? ?ED Discharge Orders   ? ? None  ? ?  ? ? ? ?Note:  This document was prepared using Dragon voice recognition software and may include unintentional dictation errors. ?  ?Rada Hay, MD ?03/24/22 2356 ? ?

## 2022-03-24 NOTE — ED Triage Notes (Signed)
Pt arrived via EMS after [employer] reported that she had a BP of 62/48. Pt has had weakness over the past several weeks and is at the facility for rehabilitation. Pt is A&O x 4. Denies any dizziness or pain. BP during triage is 112/66. ?

## 2022-03-24 NOTE — ED Notes (Signed)
Report called to Don at [facility]. Discharge instructions provided with verbal understanding. ?

## 2022-03-24 NOTE — ED Provider Notes (Signed)
? ?[facility] ?Provider Note ? ? ? Event Date/Time  ? First MD Initiated Contact with Patient 03/24/22 2215   ?  (approximate) ? ? ?History  ? ?Follow-up ? ? ?HPI ? ?Kristi [name] is a 76 y.o. female with past medical history of GERD, coronary disease, hyperlipidemia, hypertension who presents with positive blood cultures.  Patient seen in the ED yesterday for generalized weakness fatigue and hypotension.  She had otherwise reassuring work-up but UA potentially positive for infection so was given a dose of IV Rocephin and discharged on p.o. antibiotics.  Blood culture today is growing strep and anaerobic and aerobic bottle.  She was referred back to the ED.  Patient tells me that she continues to feel just generally weak and fatigued.  She has mild cough but no dyspnea no fever.  Did have 2 episodes of emesis and mild lower abdominal discomfort.  Denies urinary symptoms.  Denies abnormal heart valves prior heart surgery.  Skin or soft tissue infection. ? ?  ? ?Past Medical History:  ?Diagnosis Date  ? Acute MI, inferoposterior wall ([place]) 09/30/2014  ? 1 stent  ? Claustrophobia   ? Coronary artery disease   ? GERD (gastroesophageal reflux disease)   ? Hypercholesteremia   ? MI, old   ? Pneumonia   ? Small cell lung cancer ([place])   ? 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 [facility]) 10/27/2018  ? ? ?Patient Active Problem List  ? Diagnosis Date Noted  ? Sleep disturbance 03/16/2022  ? Malnutrition of moderate degree 03/14/2022  ? Hypotension 03/14/2022  ? Acute pulmonary embolism ([place]) 03/13/2022  ? 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 Closed T7 fracture ([place]) 03/13/2022  ? Leg weakness, bilateral 03/12/2022  ? Coronary artery disease involving native coronary artery of native heart without angina pectoris 03/12/2022  ? Chronic diastolic CHF (congestive heart failure) ([place]) 03/12/2022  ? GERD without esophagitis 03/12/2022  ? Cerebrovascular accident (CVA) ([place]) 12/19/2021  ? Brain  ischemia 09/29/2021  ? MCI (mild cognitive impairment) 06/19/2021  ? Aortic atherosclerosis ([place]) 06/18/2021  ? Incisional hernia, without obstruction or gangrene   ? Thrombocytopenia ([place]) 02/12/2021  ? 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 Diarrhea 12/05/2020  ? Osteopenia 08/12/2020  ? Ventral hernia without obstruction or gangrene 08/12/2020  ? Thoracic compression fracture, closed, initial encounter ([place]) 01/22/2020  ? Frequent falls 01/22/2020  ? Encounter for antineoplastic immunotherapy 09/14/2019  ? Memory loss 09/14/2019  ? Other fatigue 09/14/2019  ? Cancer of hilus of left lung ([place]) 06/08/2019  ? Dehydration 01/30/2019  ? Anemia due to antineoplastic chemotherapy 01/09/2019  ? Encounter for antineoplastic chemotherapy 11/02/2018  ? Cough 10/28/2018  ? Dysphagia 10/28/2018  ? Decrease in appetite 10/28/2018  ? Goals of care, counseling/discussion 10/28/2018  ? Small cell lung cancer ([place]) 10/27/2018  ? Grade I diastolic dysfunction 24/26/8341  ? Localized edema 08/29/2018  ? Cholecystitis 08/13/2018  ? Osteopenia determined by x-ray 05/19/2018  ? Prediabetes 02/08/2018  ? Hx of fracture of radius 08/18/2016  ? Vitamin D deficiency 01/07/2016  ? Mixed hyperlipidemia 01/02/2016  ? Chronic pain 01/02/2016  ? Coronary artery disease with stable angina pectoris ([place]) 01/02/2016  ? IBS (irritable bowel syndrome) 01/02/2016  ? FH: colon cancer 01/02/2016  ? DDD (degenerative disc disease), cervical 01/02/2016  ? Primary osteoarthritis of left knee 01/02/2016  ? Tobacco use disorder 01/02/2016  ? ? ? ?Physical Exam  ?Triage Vital Signs: ?ED Triage Vitals [03/24/22 2152]  ?  Enc Vitals Group  ?   BP 95/73  ?   Pulse Rate 89  ?   Resp 15  ?   Temp 98.1 ?F (36.7 ?C)  ?   Temp Source Oral  ?   SpO2 98 %  ?   Weight   ?   Height   ?   Head Circumference   ?   Peak Flow   ?   Pain Score   ?   Pain Loc   ?   Pain Edu?   ?   Excl. in [place]?   ? ? ?Most recent vital signs: ?Vitals:  ? 03/24/22 2152  ?BP: 95/73   ?Pulse: 89  ?Resp: 15  ?Temp: 98.1 ?F (36.7 ?C)  ?SpO2: 98%  ? ? ? ?General: Awake, no distress.  ?CV:  Good peripheral perfusion.  Lower extremity edema ?Resp:  Normal effort.  ?Abd:  No distention.  Mild tenderness in the left lower quadrant ?Neuro:             Awake, Alert, Oriented x 3  ?Other:   ? ? ?ED Results / Procedures / Treatments  ?Labs ?(all labs ordered are listed, but only abnormal results are displayed) ?Labs Reviewed  ?CULTURE, BLOOD (ROUTINE X 2)  ?CULTURE, BLOOD (ROUTINE X 2)  ?COMPREHENSIVE METABOLIC PANEL  ?CBC WITH DIFFERENTIAL/PLATELET  ?LACTIC ACID, PLASMA  ?LACTIC ACID, PLASMA  ?URINALYSIS, COMPLETE (UACMP) WITH MICROSCOPIC  ? ? ? ?EKG ? ? ? ? ?RADIOLOGY ? ? ? ?PROCEDURES: ? ?Critical Care performed: No ? ?Procedures ? ?The patient is on the cardiac monitor to evaluate for evidence of arrhythmia and/or significant heart rate changes. ? ? ?MEDICATIONS ORDERED IN ED: ?Medications  ?sodium chloride 0.9 % bolus 1,000 mL (has no administration in time range)  ? ? ? ?IMPRESSION / MDM / ASSESSMENT AND PLAN / ED COURSE  ?I reviewed the triage vital signs and the nursing notes. ?             ?               ? ?Differential diagnosis includes, but is not limited to, bacteremia, endocarditis, urosepsis, contaminant ? ?Patient is 76 year old female who presents with blood cultures positive for strep and both anaerobic and aerobic bottles.  Seen in the ED yesterday for generalized weakness and hypotension.  Continues to feel generally weak does not have much in the way of focal symptoms.  She has mild abdominal discomfort.  Was diagnosed with UTI yesterday given a dose of Rocephin.  Blood pressure today 90s over 70s, she is afebrile.  Overall on exam she appears somewhat fatigued but no obvious source of infection.  We will obtain a chest x-ray and CT abdomen pelvis.  Will order blood culture CBC lactate and give a liter of fluid and started on empiric bank and ceftriaxone.  Will require admission. ? ?   ? ? ?FINAL CLINICAL IMPRESSION(S) / ED DIAGNOSES  ? ?Final diagnoses:  ?Bacteremia  ? ? ? ?Rx / DC Orders  ? ?ED Discharge Orders   ? ? None  ? ?  ? ? ? ?Note:  This document was prepared using Dragon voice recognition software and may include unintentional dictation errors. ?  ?Rada Hay, MD ?03/24/22 2230 ? ?

## 2022-03-24 NOTE — ED Notes (Signed)
Port CXR performed 

## 2022-03-24 NOTE — Discharge Instructions (Signed)
1.  Take antibiotic as prescribed (Keflex 250mg  3 times daily x7 days). ?2.  Drink plenty of fluids daily. ?3.  Return to the ER for worsening symptoms, persistent vomiting, difficulty breathing or other concerns. ?

## 2022-03-24 NOTE — ED Triage Notes (Signed)
Pt arrived via [facility] from [facility] due to callback from hospital with positive blood culture and the need to return for treatment. Pt was seen 4/3 with hypotension and weakness x1 week. Pt is A&O x4 on arrival to ED.  ? ?Result in chart shows + Culture for Strep  ?

## 2022-03-25 DIAGNOSIS — B955 Unspecified streptococcus as the cause of diseases classified elsewhere: Secondary | ICD-10-CM | POA: Diagnosis present

## 2022-03-25 DIAGNOSIS — M5124 Other intervertebral disc displacement, thoracic region: Secondary | ICD-10-CM | POA: Diagnosis not present

## 2022-03-25 DIAGNOSIS — R7881 Bacteremia: Secondary | ICD-10-CM | POA: Diagnosis present

## 2022-03-25 DIAGNOSIS — Z66 Do not resuscitate: Secondary | ICD-10-CM | POA: Diagnosis present

## 2022-03-25 DIAGNOSIS — K5900 Constipation, unspecified: Secondary | ICD-10-CM | POA: Diagnosis not present

## 2022-03-25 DIAGNOSIS — I2699 Other pulmonary embolism without acute cor pulmonale: Secondary | ICD-10-CM | POA: Diagnosis not present

## 2022-03-25 DIAGNOSIS — Z923 Personal history of irradiation: Secondary | ICD-10-CM | POA: Diagnosis not present

## 2022-03-25 DIAGNOSIS — D630 Anemia in neoplastic disease: Secondary | ICD-10-CM | POA: Diagnosis present

## 2022-03-25 DIAGNOSIS — E876 Hypokalemia: Secondary | ICD-10-CM | POA: Diagnosis present

## 2022-03-25 DIAGNOSIS — E785 Hyperlipidemia, unspecified: Secondary | ICD-10-CM | POA: Diagnosis present

## 2022-03-25 DIAGNOSIS — R7401 Elevation of levels of liver transaminase levels: Secondary | ICD-10-CM | POA: Diagnosis present

## 2022-03-25 DIAGNOSIS — K219 Gastro-esophageal reflux disease without esophagitis: Secondary | ICD-10-CM

## 2022-03-25 DIAGNOSIS — Z20822 Contact with and (suspected) exposure to covid-19: Secondary | ICD-10-CM | POA: Diagnosis present

## 2022-03-25 DIAGNOSIS — I252 Old myocardial infarction: Secondary | ICD-10-CM | POA: Diagnosis not present

## 2022-03-25 DIAGNOSIS — I959 Hypotension, unspecified: Secondary | ICD-10-CM | POA: Diagnosis present

## 2022-03-25 DIAGNOSIS — S22069A Unspecified fracture of T7-T8 vertebra, initial encounter for closed fracture: Secondary | ICD-10-CM | POA: Diagnosis present

## 2022-03-25 DIAGNOSIS — E860 Dehydration: Secondary | ICD-10-CM | POA: Diagnosis present

## 2022-03-25 DIAGNOSIS — G8929 Other chronic pain: Secondary | ICD-10-CM | POA: Diagnosis present

## 2022-03-25 DIAGNOSIS — I110 Hypertensive heart disease with heart failure: Secondary | ICD-10-CM | POA: Diagnosis present

## 2022-03-25 DIAGNOSIS — I2510 Atherosclerotic heart disease of native coronary artery without angina pectoris: Secondary | ICD-10-CM | POA: Diagnosis present

## 2022-03-25 DIAGNOSIS — I700 Atherosclerosis of aorta: Secondary | ICD-10-CM | POA: Diagnosis present

## 2022-03-25 DIAGNOSIS — E782 Mixed hyperlipidemia: Secondary | ICD-10-CM | POA: Diagnosis present

## 2022-03-25 DIAGNOSIS — C3490 Malignant neoplasm of unspecified part of unspecified bronchus or lung: Secondary | ICD-10-CM | POA: Diagnosis present

## 2022-03-25 DIAGNOSIS — I38 Endocarditis, valve unspecified: Secondary | ICD-10-CM | POA: Diagnosis not present

## 2022-03-25 DIAGNOSIS — G3184 Mild cognitive impairment, so stated: Secondary | ICD-10-CM | POA: Diagnosis present

## 2022-03-25 DIAGNOSIS — S22069D Unspecified fracture of T7-T8 vertebra, subsequent encounter for fracture with routine healing: Secondary | ICD-10-CM | POA: Diagnosis not present

## 2022-03-25 DIAGNOSIS — C7931 Secondary malignant neoplasm of brain: Secondary | ICD-10-CM | POA: Diagnosis present

## 2022-03-25 DIAGNOSIS — I5189 Other ill-defined heart diseases: Secondary | ICD-10-CM | POA: Diagnosis not present

## 2022-03-25 DIAGNOSIS — N179 Acute kidney failure, unspecified: Secondary | ICD-10-CM | POA: Diagnosis present

## 2022-03-25 DIAGNOSIS — R0600 Dyspnea, unspecified: Secondary | ICD-10-CM | POA: Diagnosis not present

## 2022-03-25 DIAGNOSIS — I5032 Chronic diastolic (congestive) heart failure: Secondary | ICD-10-CM | POA: Diagnosis present

## 2022-03-25 HISTORY — DX: Unspecified streptococcus as the cause of diseases classified elsewhere: B95.5

## 2022-03-25 LAB — MRSA NEXT GEN BY PCR, NASAL: MRSA by PCR Next Gen: NOT DETECTED

## 2022-03-25 LAB — URINALYSIS, COMPLETE (UACMP) WITH MICROSCOPIC
Bacteria, UA: NONE SEEN
Bilirubin Urine: NEGATIVE
Glucose, UA: NEGATIVE mg/dL
Ketones, ur: NEGATIVE mg/dL
Nitrite: POSITIVE — AB
Protein, ur: NEGATIVE mg/dL
Specific Gravity, Urine: 1.013 (ref 1.005–1.030)
pH: 6 (ref 5.0–8.0)

## 2022-03-25 LAB — LACTIC ACID, PLASMA
Lactic Acid, Venous: 0.8 mmol/L (ref 0.5–1.9)
Lactic Acid, Venous: 1.4 mmol/L (ref 0.5–1.9)

## 2022-03-25 MED ORDER — SODIUM CHLORIDE 0.9 % IV SOLN
2.0000 g | INTRAVENOUS | Status: DC
  Administered 2022-03-25: 2 g via INTRAVENOUS
  Filled 2022-03-25: qty 2

## 2022-03-25 MED ORDER — MAGNESIUM HYDROXIDE 400 MG/5ML PO SUSP: 30.0000 mL | Freq: Every day | ORAL | Status: DC | PRN

## 2022-03-25 MED ORDER — TRAZODONE HCL 50 MG PO TABS: 25.0000 mg | ORAL_TABLET | Freq: Every evening | ORAL | Status: DC | PRN

## 2022-03-25 MED ORDER — ACETAMINOPHEN 325 MG PO TABS
650.0000 mg | ORAL_TABLET | Freq: Four times a day (QID) | ORAL | Status: DC | PRN
  Administered 2022-03-26 – 2022-03-30 (×3): 650 mg via ORAL
  Filled 2022-03-25 (×3): qty 2

## 2022-03-25 MED ORDER — ACETAMINOPHEN 650 MG RE SUPP: 650.0000 mg | Freq: Four times a day (QID) | RECTAL | Status: DC | PRN

## 2022-03-25 MED ORDER — ENOXAPARIN SODIUM 40 MG/0.4ML IJ SOSY
40.0000 mg | PREFILLED_SYRINGE | INTRAMUSCULAR | Status: DC
  Administered 2022-03-25 – 2022-03-26 (×2): 40 mg via SUBCUTANEOUS
  Filled 2022-03-25 (×2): qty 0.4

## 2022-03-25 MED ORDER — ONDANSETRON HCL 4 MG PO TABS: 4.0000 mg | ORAL_TABLET | Freq: Four times a day (QID) | ORAL | Status: DC | PRN

## 2022-03-25 MED ORDER — ONDANSETRON HCL 4 MG/2ML IJ SOLN: 4.0000 mg | Freq: Four times a day (QID) | INTRAMUSCULAR | Status: DC | PRN

## 2022-03-25 MED ORDER — POTASSIUM CHLORIDE IN NACL 20-0.9 MEQ/L-% IV SOLN
INTRAVENOUS | Status: AC
  Filled 2022-03-25 (×6): qty 1000

## 2022-03-25 NOTE — Assessment & Plan Note (Addendum)
Chronic issue.  Pain initially improved with lidocaine patch.   ?

## 2022-03-25 NOTE — Assessment & Plan Note (Addendum)
Replacing as needed 

## 2022-03-25 NOTE — ED Notes (Signed)
Pt resting at this time. Warm blanket provided. Husband at bedside. Pt denies any other needs at this time.  ?

## 2022-03-25 NOTE — ED Notes (Signed)
Coffee provided to pt per request.  ?

## 2022-03-25 NOTE — Assessment & Plan Note (Signed)
-   We will continue Carafate and PPI therapy. ?

## 2022-03-25 NOTE — Progress Notes (Signed)
[facility] Progress Note ? ?Patient: Kristi Walraven [facility]    WFU:[id number]  DOA: 03/24/2022    ?Date of Service: the patient was seen and examined on 03/25/2022 ? 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  She presented to the emergency room on 4/3 with complaints of weakness.  At that time, blood cultures were done.  Patient sent back to skilled nursing facility.  Those blood cultures came back positive for Streptococcus species in both bottles and patient sent back to the emergency room on the night of 4/4.  Repeat blood cultures were done.  Patient noted to have soft blood pressures with systolic in the 20U to 54Y.  Patient given IV fluids and IV antibiotics. ? ?Assessment and Plan: ?Assessment and Plan: ?* Streptococcal bacteremia ?Admitted to the hospitalist service.  Started on IV Rocephin.  Infectious disease consulted.  Continue IV fluids.  Lactic acid level normalized.  Unclear etiology.  No evidence of GI or pneumonia infection.  2D echo pending ? ?Hypotension ?An ongoing issue.  Does not appear to be sepsis.  Patient previously had been on midodrine. ? ?Dyslipidemia ?- We will continue statin therapy ? ?Acute pulmonary embolism ([place]) ?Found during last hospitalization.  Started on Xarelto which we will continue. ? ?Hypokalemia ?Replacing as needed. ? ?GERD without esophagitis ?- We will continue Carafate and PPI therapy. ? ?Small cell lung cancer ([place]) ?No chemo at this time until patient improves. ? ?Grade I diastolic dysfunction ?Will check BNP ? ?Closed T7 fracture ([place]) ?Chronic issue.  Pain improved with lidocaine patch. ? ? ? ? ? ? ?There is no height or weight on file to calculate BMI.  ?  ?   ? ?Consultants: ?Infectious  disease ? ?Procedures: ?Echocardiogram pending ? ?Antimicrobials: ?IV vancomycin 4/4 x 1 dose ?IV Rocephin 4/4-present ? ?Code Status: DNR ? ? ?Subjective: Patient doing okay, complains of some generalized weakness and chronic upper back pain ? ?Objective: ?Soft blood pressures noted ?Vitals:  ? 03/25/22 1400 03/25/22 1732  ?BP: (!) 102/56 97/73  ?Pulse: 82 82  ?Resp: 18 18  ?Temp: 98.2 ?F (36.8 ?C) 98.1 ?F (36.7 ?C)  ?SpO2: 98% 99%  ? ? ?Intake/Output Summary (Last 24 hours) at 03/25/2022 1925 ?Last data filed at 03/25/2022 1819 ?Gross per 24 hour  ?Intake 2204.1 ml  ?Output --  ?Net 2204.1 ml  ? ?There were no vitals filed for this visit. ?There is no height or weight on file to calculate BMI. ? ?Exam: ? ?General: Alert and oriented x3, no acute distress ?HEENT: Normocephalic atraumatic, mucous membranes are moist ?Cardiovascular: Regular rate and rhythm, S1-S2 ?Respiratory: Clear to auscultation bilaterally ?Abdomen: Soft, nontender, nondistended, positive bowel sounds ?Musculoskeletal: No clubbing or cyanosis or edema ?Skin: No skin breaks, tears or lesions ?Psychiatry: Appropriate, no evidence of psychoses ?Neurology: Lower extremity weakness, present from previous hospitalization. ? ?Data Reviewed: ?There are no new results to review at this time. ? ?Disposition:  ?Status is: Inpatient ?Remains inpatient appropriate because: Treatment of bacteremia ?  ? ?Anticipated discharge date: 4/7 ? ?Remaining issues to be resolved so that patient can be discharged: Treatment of bacteremia ? ? ?Family Communication: Left message for husband ?DVT Prophylaxis: ?enoxaparin (LOVENOX) injection 40 mg Start: 03/25/22 0700 ? ? ? ?Author: ?Annita Brod ,  MD ?03/25/2022 7:25 PM ? ?To reach On-call, see care teams to locate the attending and reach out via [email or web address]. ?Between 7PM-7AM, please contact night-coverage ?If you still have difficulty reaching the attending provider, please page the [facility] (Director on Call) for [facility] on amion for assistance. ? ?

## 2022-03-25 NOTE — H&P (Signed)
?  ?  ?[place] ? ? ?PATIENT NAME: Kristi [name]   ? ?MR#:  [id number] ? ?DATE OF BIRTH:  26-Feb-1946 ? ?DATE OF ADMISSION:  03/24/2022 ? ?PRIMARY CARE PHYSICIAN: Glean Hess, MD  ? ?Patient is coming from: Home ? ?REQUESTING/REFERRING PHYSICIAN: Rada Hay, MD ? ?CHIEF COMPLAINT:  ? ?Chief Complaint  ?Patient presents with  ? Follow-up  ? ? ?HISTORY OF PRESENT ILLNESS:  ?Kristi [name] is a 76 y.o. 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  She had 2 blood cultures that came back positive for streptococcal species in both aerobic and anaerobic bottles..  The patient denies any dyspnea or cough or wheezing.  She admits to nasal congestion without sore throat or earache.  She had nausea and vomiting yesterday.  No chest pain or palpitations.  No dysuria, oliguria or hematuria or flank pain.  She denies any valvular heart disease. ? ?ED Course: Upon presentation to the ER, vital signs were within normal.  Labs revealed hypokalemia of 2.8 and calcium 8.6 with albumin 3.3 and AST 58 and ALT 78.  CBC showed mild anemia close to baseline. ?EKG as reviewed by me : EKG showed sinus rhythm with a rate of 77 with intraventricular conduction delay and low voltage QRS.  She had RSR' in V1. ?Imaging: Portable chest ray showed no acute cardiopulmonary disease. ? ?The patient was given 2 g of IV Rocephin IV vancomycin as well as 1 L bolus of IV normal saline.  She will be admitted to a progressive unit bed for further evaluation and management. ?PAST MEDICAL HISTORY:  ? ?Past Medical History:  ?Diagnosis Date  ? Acute MI, inferoposterior wall ([place]) 09/30/2014  ? 1 stent  ? Claustrophobia   ? Coronary artery disease   ? GERD  (gastroesophageal reflux disease)   ? Hypercholesteremia   ? MI, old   ? Pneumonia   ? Small cell lung cancer ([place])   ? 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 [facility]) 10/27/2018  ? ? ?PAST SURGICAL HISTORY:  ? ?Past Surgical History:  ?Procedure Laterality Date  ? APPENDECTOMY    ? 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  ? BLADDER NECK SUSPENSION    ? CHOLECYSTECTOMY N/A 08/14/2018  ? Procedure: LAPAROSCOPIC CHOLECYSTECTOMY;  Surgeon: Jules Husbands, MD;  Location: [facility] ORS;  Service: General;  Laterality: N/A;  ? CHOLECYSTECTOMY  11/2018  ? CORONARY ANGIOPLASTY  09/29/2014  ? CORONARY STENT PLACEMENT    ? ENDOBRONCHIAL ULTRASOUND N/A 10/21/2018  ? Procedure: ENDOBRONCHIAL ULTRASOUND;  Surgeon: Laverle Hobby, MD;  Location: [facility] ORS;  Service: Pulmonary;  Laterality: N/A;  ? HERNIA REPAIR  2012  ? IR FLUORO GUIDE CV LINE RIGHT  12/19/2018  ? KYPHOPLASTY N/A 03/01/2020  ? Procedure: T7 KYPHOPLASTY;  Surgeon: Hessie Knows, MD;  Location: [facility] ORS;  Service: Orthopedics;  Laterality: N/A;  ? LAPAROTOMY    ? LEFT HEART CATHETERIZATION WITH CORONARY ANGIOGRAM N/A 09/29/2014  ? Procedure: LEFT HEART CATHETERIZATION WITH CORONARY ANGIOGRAM;  Surgeon: Clent Demark, MD;  Location: [facility] CATH LAB;  Service: Cardiovascular;  Laterality: N/A;  ? PORTA CATH INSERTION N/A 01/02/2019  ? Procedure: PORTA CATH INSERTION;  Surgeon: Algernon Huxley, MD;  Location: [place] CV LAB;  Service:  Cardiovascular;  Laterality: N/A;  ? PORTACATH PLACEMENT Right 10/28/2018  ? Procedure: INSERTION PORT-A-CATH;  Surgeon: Jules Husbands, MD;  Location: [facility] ORS;  Service: General;  Laterality: Right;  ? XI ROBOTIC ASSISTED VENTRAL HERNIA N/A 02/27/2021  ? Procedure: XI ROBOTIC ASSISTED VENTRAL HERNIA (incisional) WITH MESH;  Surgeon: Olean Ree, MD;  Location: [facility] ORS;  Service: General;  Laterality: N/A;  ? ? ?SOCIAL HISTORY:  ? ?Social History  ? ?Tobacco Use  ? Smoking status: Former  ?  Packs/day: 1.00  ?  Years: 39.00  ?  Pack years: 39.00  ?  Types:  Cigarettes  ?  Start date: 12/21/1978  ?  Quit date: 10/16/2018  ?  Years since quitting: 3.4  ? Smokeless tobacco: Never  ?Substance Use Topics  ? Alcohol use: Yes  ?  Alcohol/week: 0.0 standard drinks  ?  Comment: occassional - approx 1 every 2 weeks   ? ? ?FAMILY HISTORY:  ? ?Family History  ?Problem Relation Age of Onset  ? Alzheimer's disease Mother   ? Colon cancer Father   ? Breast cancer Neg Hx   ? ? ?DRUG ALLERGIES:  ?No Known Allergies ? ?REVIEW OF SYSTEMS:  ? ?ROS ?As per history of present illness. All pertinent systems were reviewed above. Constitutional, HEENT, cardiovascular, respiratory, GI, GU, musculoskeletal, neuro, psychiatric, endocrine, integumentary and hematologic systems were reviewed and are otherwise negative/unremarkable except for positive findings mentioned above in the HPI. ? ? ?MEDICATIONS AT HOME:  ? ?Prior to Admission medications   ?Medication Sig Start Date End Date Taking? Authorizing Provider  ?aspirin EC 81 MG tablet Take 1 tablet (81 mg total) by mouth daily. Swallow whole. 03/17/22 03/17/23 Yes Annita Brod, MD  ?atorvastatin (LIPITOR) 40 MG tablet Take 1 tablet (40 mg total) by mouth daily. 12/19/21  Yes Vaslow, Acey Lav, MD  ?Calcium 600-200 MG-UNIT tablet Take 1 tablet by mouth 2 (two) times daily.   Yes [provider]  ?cephALEXin (KEFLEX) 250 MG capsule Take 1 capsule (250 mg total) by mouth 3 (three) times daily. 03/24/22  Yes Paulette Blanch, MD  ?Cholecalciferol (DIALYVITE VITAMIN D 5000) 125 MCG (5000 UT) capsule Take 5,000 Units by mouth daily.   Yes [provider]  ?fludrocortisone (FLORINEF) 0.1 MG tablet Take 0.1 mg by mouth daily.   Yes [provider]  ?lidocaine (LIDODERM) 5 % Place 1 patch onto the skin daily. Remove & Discard patch within 12 hours or as directed by MD 03/18/22  Yes Annita Brod, MD  ?Magnesium 250 MG TABS Take 250 mg by mouth daily.   Yes [provider]  ?pantoprazole (PROTONIX) 40 MG tablet Take 1  tablet (40 mg total) by mouth 2 (two) times daily. 04/02/21  Yes Earlie Server, MD  ?Rivaroxaban (XARELTO) 15 MG TABS tablet Take 1 tablet (15 mg total) by mouth 2 (two) times daily with a meal for 18 days. 03/17/22 04/04/22 Yes Annita Brod, MD  ?sertraline (ZOLOFT) 100 MG tablet Take 1 tablet (100 mg total) by mouth daily. 03/31/21  Yes Borders, Kirt Boys, NP  ?traZODone (DESYREL) 50 MG tablet Take 1 tablet (50 mg total) by mouth at bedtime as needed for sleep. 03/31/21  Yes Borders, Kirt Boys, NP  ?vitamin B-12 (CYANOCOBALAMIN) 1000 MCG tablet Take 1,000 mcg by mouth daily.   Yes [provider]  ?acetaminophen (TYLENOL) 500 MG tablet Take 2 tablets (1,000 mg total) by mouth every 6 (six) hours as needed for mild pain or  headache. 02/27/21   Olean Ree, MD  ?alum & mag hydroxide-simeth (MAALOX/MYLANTA) 200-200-20 MG/5ML suspension Take 30 mLs by mouth every 6 (six) hours as needed for indigestion or heartburn. 03/17/22   Annita Brod, MD  ?diphenhydrAMINE (BENADRYL) 25 mg capsule Take 1 capsule (25 mg total) by mouth at bedtime as needed (if no sleep even with trazodone). 03/17/22   Annita Brod, MD  ?lidocaine-prilocaine (EMLA) cream Apply 1 application topically daily as needed (port access). 04/15/20   Borders, Kirt Boys, NP  ?ondansetron (ZOFRAN) 8 MG tablet Take 1 tablet (8 mg total) by mouth every 8 (eight) hours as needed for nausea or vomiting. ?Patient not taking: Reported on 03/25/2022 12/01/21   Earlie Server, MD  ?oxyCODONE (ROXICODONE) 5 MG immediate release tablet Take 1 tablet (5 mg total) by mouth every 12 (twelve) hours as needed for severe pain. ?Patient not taking: Reported on 03/24/2022 03/17/22 03/17/23  Annita Brod, MD  ?polyethylene glycol (MIRALAX / GLYCOLAX) 17 g packet Take 17 g by mouth daily as needed for mild constipation. 03/17/22   Annita Brod, MD  ?prochlorperazine (COMPAZINE) 10 MG tablet Take 10 mg by mouth every 6 (six) hours as needed for nausea or vomiting.     [provider]  ?rivaroxaban (XARELTO) 20 MG TABS tablet Take 1 tablet (20 mg total) by mouth daily with supper. 04/05/22   Annita Brod, MD  ?sucralfate (CARAFATE) 1 g tablet Take 1 g by mouth 3 (three

## 2022-03-25 NOTE — Assessment & Plan Note (Addendum)
Admitted to the hospitalist service.  Started on IV Rocephin.  Infectious disease consulted.  Continue IV fluids.  Lactic acid level normalized.  Unclear etiology.  No evidence of GI or pneumonia infection.  2D echo unremarkable.  Underwent TEE on 4/10 which was unremarkable.  Patient will complete 7 days of IV antibiotics on 4/11 and then 3 days of p.o. upon discharge ?

## 2022-03-25 NOTE — Assessment & Plan Note (Signed)
No chemo at this time until patient improves. ?

## 2022-03-25 NOTE — Hospital Course (Addendum)
Patient is a 76 year old female with past medical history of CAD, small cell lung cancer with right chest wall catheter implant status postchemotherapy 3 weeks prior who was just discharged from the hospitalist service on 3/29 for weakness and went to a skilled nursing facility.  She presented to the emergency room on 4/3 with complaints of weakness.  At that time, blood cultures were done.  Patient sent back to skilled nursing facility.  Those blood cultures came back positive for Streptococcus species in both bottles and patient sent back to the emergency room on the night of 4/4.  Repeat blood cultures were done.  Patient noted to have soft blood pressures with systolic in the 86U to 54I.  Patient given IV fluids and IV antibiotics.  Work-up including 2D echo and TEE were unremarkable.  By 4/11, patient had completed 7 days of IV penicillin.  Infectious disease following recommending 3 more days of p.o. antibiotics for total of 10 days of therapy.  Patient had been in skilled nursing and had come from the, she has progressed somewhat and would prefer to go home so she will be discharged with home health. ?

## 2022-03-25 NOTE — Consult Note (Signed)
NAME: Kristi [name] [facility]  ?DOB: 08/05/1946  ?MRN: [id number]  ?Date/Time: 03/25/2022 12:58 PM ? ?REQUESTING PROVIDER: Dr. Audery Amel ?  ?REASON FOR CONSULT: Streptococcus bacteremia ??spoke to husband and patient . Chart reviewed ?Kristi [name] is a 76 y.o. 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 ?Patient was recently hospitalized between 03/12/2022 until 03/18/2022 for bilateral leg weakness, found to have acute pulmonary embolism also was hypotensive.  She was then assessed by neurology for bilateral leg weakness and found to have no focal deficit ?MRI of the spine did not show any mets or lesions. She was sent to SNF ?She presented to the ED vial EMS from SNF on 03/24/22 with hypotension and weakness and drowsiness ?In the ED she was given fluids, blood culture sent and as she wa sstable she was sent back to snf after getting ceftriaxone for a presumed UTI and a prescription for  oral antibiotics . ?She was called back as the blood culture positibe for strep ?Pt is weak, but does not c/o fever or chills. She has not been mobile since she went to SNF- has not started rehab yet. She says appetitie fair. No more pain in her back than usual. She has a port and it gets flushed every 2-3 weeks ?Does not have any diarrhea, dysuria, pain abdomen, cough or sob ? ? ?Past Medical History:  ?Diagnosis Date  ? Acute MI, inferoposterior wall ([place]) 09/30/2014  ? 1 stent  ? Claustrophobia   ? Coronary artery disease   ? GERD (gastroesophageal reflux disease)   ? Hypercholesteremia   ? MI, old   ? Pneumonia   ? Small cell lung cancer ([place])   ? 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 [facility]) 10/27/2018  ?  ?Past Surgical History:  ?Procedure Laterality Date  ? APPENDECTOMY    ? 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  ? BLADDER NECK SUSPENSION    ? CHOLECYSTECTOMY N/A 08/14/2018  ?  Procedure: LAPAROSCOPIC CHOLECYSTECTOMY;  Surgeon: Jules Husbands, MD;  Location: [facility] ORS;  Service: General;  Laterality: N/A;  ? CHOLECYSTECTOMY  11/2018  ? CORONARY ANGIOPLASTY  09/29/2014  ? CORONARY STENT PLACEMENT    ? ENDOBRONCHIAL ULTRASOUND N/A 10/21/2018  ? Procedure: ENDOBRONCHIAL ULTRASOUND;  Surgeon: Laverle Hobby, MD;  Location: [facility] ORS;  Service: Pulmonary;  Laterality: N/A;  ? HERNIA REPAIR  2012  ? IR FLUORO GUIDE CV LINE RIGHT  12/19/2018  ? KYPHOPLASTY N/A 03/01/2020  ? Procedure: T7 KYPHOPLASTY;  Surgeon: Hessie Knows, MD;  Location: [facility] ORS;  Service: Orthopedics;  Laterality: N/A;  ? LAPAROTOMY    ? LEFT HEART CATHETERIZATION WITH CORONARY ANGIOGRAM N/A 09/29/2014  ? Procedure: LEFT HEART CATHETERIZATION WITH CORONARY ANGIOGRAM;  Surgeon: Clent Demark, MD;  Location: [facility] CATH LAB;  Service: Cardiovascular;  Laterality: N/A;  ? PORTA CATH INSERTION N/A 01/02/2019  ? Procedure: PORTA CATH INSERTION;  Surgeon: Algernon Huxley, MD;  Location: [place] CV LAB;  Service: Cardiovascular;  Laterality: N/A;  ? PORTACATH PLACEMENT Right 10/28/2018  ? Procedure: INSERTION PORT-A-CATH;  Surgeon: Jules Husbands, MD;  Location: [facility] ORS;  Service: General;  Laterality: Right;  ? XI ROBOTIC ASSISTED VENTRAL HERNIA N/A 02/27/2021  ? Procedure: XI ROBOTIC ASSISTED VENTRAL HERNIA (incisional) WITH MESH;  Surgeon: Olean Ree, MD;  Location: [facility] ORS;  Service: General;  Laterality: N/A;  ?  ?  Social History  ? ?Socioeconomic History  ? Marital status: Married  ?  Spouse name: [name]   ? Number of children: 3  ? Years of education: Not on file  ? Highest education level: Not on file  ?Occupational History  ? 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 Smoking status: Former  ?  Packs/day: 1.00  ?  Years: 39.00  ?  Pack years: 39.00  ?  Types: Cigarettes  ?  Start date: 12/21/1978  ?  Quit date: 10/16/2018  ?  Years since quitting: 3.4  ? Smokeless tobacco: Never  ?Vaping Use  ? Vaping  Use: Never used  ?Substance and Sexual Activity  ? Alcohol use: Yes  ?  Alcohol/week: 0.0 standard drinks  ?  Comment: occassional - approx 1 every 2 weeks   ? Drug use: No  ? Sexual activity: Yes  ?  Birth control/protection: None  ?Other Topics Concern  ? Not on file  ?Social History Narrative  ? Not on file  ? ?Social Determinants of Health  ? ?Financial Resource Strain: Not on file  ?Food Insecurity: Not on file  ?Transportation Needs: Not on file  ?Physical Activity: Not on file  ?Stress: Not on file  ?Social Connections: Not on file  ?Intimate Partner Violence: Not on file  ?  ?Family History  ?Problem Relation Age of Onset  ? Alzheimer's disease Mother   ? Colon cancer Father   ? Breast cancer Neg Hx   ? ?No Known Allergies ?I? ?Current Facility-Administered Medications  ?Medication Dose Route Frequency Provider Last Rate Last Admin  ? 0.9 % NaCl with KCl 20 mEq/ L  infusion   Intravenous Continuous Mansy, Arvella Merles, MD 125 mL/hr at 03/25/22 0844 New Bag at 03/25/22 0844  ? acetaminophen (TYLENOL) tablet 650 mg  650 mg Oral Q6H PRN Mansy, Jan A, MD      ? Or  ? acetaminophen (TYLENOL) suppository 650 mg  650 mg Rectal Q6H PRN Mansy, Jan A, MD      ? cefTRIAXone (ROCEPHIN) 2 g in sodium chloride 0.9 % 100 mL IVPB  2 g Intravenous Q24H Mansy, Jan A, MD      ? enoxaparin (LOVENOX) injection 40 mg  40 mg Subcutaneous Q24H Mansy, Jan A, MD   40 mg at 03/25/22 5400  ? magnesium hydroxide (MILK OF MAGNESIA) suspension 30 mL  30 mL Oral Daily PRN Mansy, Jan A, MD      ? ondansetron [facility]) tablet 4 mg  4 mg Oral Q6H PRN Mansy, Jan A, MD      ? Or  ? ondansetron [facility]) injection 4 mg  4 mg Intravenous Q6H PRN Mansy, Jan A, MD      ? traZODone (DESYREL) tablet 25 mg  25 mg Oral QHS PRN Mansy, Arvella Merles, MD      ? ?Current Outpatient Medications  ?Medication Sig Dispense Refill  ? aspirin EC 81 MG tablet Take 1 tablet (81 mg total) by mouth daily. Swallow whole. 150 tablet 2  ? atorvastatin (LIPITOR) 40 MG tablet Take 1  tablet (40 mg total) by mouth daily. 30 tablet 3  ? Calcium 600-200 MG-UNIT tablet Take 1 tablet by mouth 2 (two) times daily.    ? cephALEXin (KEFLEX) 250 MG capsule Take 1 capsule (250 mg total) by mouth 3 (three) times daily. 21 capsule 0  ? Cholecalciferol (DIALYVITE VITAMIN D 5000) 125 MCG (5000 UT) capsule Take 5,000 Units by  mouth daily.    ? fludrocortisone (FLORINEF) 0.1 MG tablet Take 0.1 mg by mouth daily.    ? lidocaine (LIDODERM) 5 % Place 1 patch onto the skin daily. Remove & Discard patch within 12 hours or as directed by MD 30 patch 0  ? Magnesium 250 MG TABS Take 250 mg by mouth daily.    ? pantoprazole (PROTONIX) 40 MG tablet Take 1 tablet (40 mg total) by mouth 2 (two) times daily. 60 tablet 0  ? Rivaroxaban (XARELTO) 15 MG TABS tablet Take 1 tablet (15 mg total) by mouth 2 (two) times daily with a meal for 18 days. 36 tablet 0  ? sertraline (ZOLOFT) 100 MG tablet Take 1 tablet (100 mg total) by mouth daily. 90 tablet 3  ? traZODone (DESYREL) 50 MG tablet Take 1 tablet (50 mg total) by mouth at bedtime as needed for sleep. 30 tablet 2  ? vitamin B-12 (CYANOCOBALAMIN) 1000 MCG tablet Take 1,000 mcg by mouth daily.    ? acetaminophen (TYLENOL) 500 MG tablet Take 2 tablets (1,000 mg total) by mouth every 6 (six) hours as needed for mild pain or headache.    ? alum & mag hydroxide-simeth (MAALOX/MYLANTA) 200-200-20 MG/5ML suspension Take 30 mLs by mouth every 6 (six) hours as needed for indigestion or heartburn. 355 mL 0  ? diphenhydrAMINE (BENADRYL) 25 mg capsule Take 1 capsule (25 mg total) by mouth at bedtime as needed (if no sleep even with trazodone). 30 capsule 0  ? lidocaine-prilocaine (EMLA) cream Apply 1 application topically daily as needed (port access). 30 g 2  ? ondansetron (ZOFRAN) 8 MG tablet Take 1 tablet (8 mg total) by mouth every 8 (eight) hours as needed for nausea or vomiting. (Patient not taking: Reported on 03/25/2022) 60 tablet 1  ? oxyCODONE (ROXICODONE) 5 MG immediate release  tablet Take 1 tablet (5 mg total) by mouth every 12 (twelve) hours as needed for severe pain. (Patient not taking: Reported on 03/24/2022) 10 tablet 0  ? polyethylene glycol (MIRALAX / GLYCOLAX) 17 g packet Take 17

## 2022-03-25 NOTE — Assessment & Plan Note (Addendum)
Euvolemic.  BNP within normal limits.  Patient complaining of some increased dyspnea on exertion, but BNP on 4/9 still normal.  Chest x-ray unremarkable. ?

## 2022-03-25 NOTE — Assessment & Plan Note (Signed)
-   We will continue statin therapy. 

## 2022-03-25 NOTE — Assessment & Plan Note (Addendum)
Found during last hospitalization.  Started on Xarelto which we will continue. ?

## 2022-03-25 NOTE — Assessment & Plan Note (Addendum)
An ongoing issue.  Does not appear to be sepsis.  Previously been on midodrine, so have restarted. ?

## 2022-03-26 ENCOUNTER — Inpatient Hospital Stay (HOSPITAL_COMMUNITY)
Admit: 2022-03-26 | Discharge: 2022-03-26 | Disposition: A | Discharge status: Home / Self Care | Payer: Medicare Other | Attending: Infectious Diseases | Admitting: Infectious Diseases

## 2022-03-26 DIAGNOSIS — I5189 Other ill-defined heart diseases: Secondary | ICD-10-CM | POA: Diagnosis not present

## 2022-03-26 DIAGNOSIS — C3490 Malignant neoplasm of unspecified part of unspecified bronchus or lung: Secondary | ICD-10-CM | POA: Diagnosis not present

## 2022-03-26 DIAGNOSIS — R7881 Bacteremia: Secondary | ICD-10-CM

## 2022-03-26 DIAGNOSIS — I959 Hypotension, unspecified: Secondary | ICD-10-CM

## 2022-03-26 LAB — CBC
HCT: 29.8 % — ABNORMAL LOW (ref 36.0–46.0)
Hemoglobin: 9.7 g/dL — ABNORMAL LOW (ref 12.0–15.0)
MCH: 28.6 pg (ref 26.0–34.0)
MCHC: 32.6 g/dL (ref 30.0–36.0)
MCV: 87.9 fL (ref 80.0–100.0)
Platelets: 250 10*3/uL (ref 150–400)
RBC: 3.39 MIL/uL — ABNORMAL LOW (ref 3.87–5.11)
RDW: 14.6 % (ref 11.5–15.5)
WBC: 5.3 10*3/uL (ref 4.0–10.5)
nRBC: 0 % (ref 0.0–0.2)

## 2022-03-26 LAB — BASIC METABOLIC PANEL
Anion gap: 8 (ref 5–15)
BUN: 7 mg/dL — ABNORMAL LOW (ref 8–23)
CO2: 21 mmol/L — ABNORMAL LOW (ref 22–32)
Calcium: 8.7 mg/dL — ABNORMAL LOW (ref 8.9–10.3)
Chloride: 111 mmol/L (ref 98–111)
Creatinine, Ser: 0.86 mg/dL (ref 0.44–1.00)
GFR, Estimated: >60 mL/min (ref >60)
Glucose, Bld: 91 mg/dL (ref 70–99)
Potassium: 3.3 mmol/L — ABNORMAL LOW (ref 3.5–5.1)
Sodium: 140 mmol/L (ref 135–145)

## 2022-03-26 LAB — CULTURE, BLOOD (ROUTINE X 2)

## 2022-03-26 LAB — ECHOCARDIOGRAM COMPLETE
AR max vel: 1.93 cm2
AV Area VTI: 1.92 cm2
AV Area mean vel: 1.7 cm2
AV Mean grad: 8 mmHg
AV Peak grad: 15.7 mmHg
Ao pk vel: 1.98 m/s
Area-P 1/2: 3.54 cm2
Height: 65 in
MV VTI: 3.35 cm2
S' Lateral: 2.25 cm
Weight: 2320 oz

## 2022-03-26 LAB — BRAIN NATRIURETIC PEPTIDE: B Natriuretic Peptide: 63.4 pg/mL (ref 0.0–100.0)

## 2022-03-26 LAB — URINE CULTURE: Culture: >=100000 — AB

## 2022-03-26 LAB — CORTISOL-AM, BLOOD: Cortisol - AM: 8.1 ug/dL (ref 6.7–22.6)

## 2022-03-26 LAB — PROCALCITONIN: Procalcitonin: <0.1 ng/mL

## 2022-03-26 LAB — PROTIME-INR
INR: 1.1 (ref 0.8–1.2)
Prothrombin Time: 13.8 seconds (ref 11.4–15.2)

## 2022-03-26 MED ORDER — PENICILLIN G POT IN DEXTROSE 60000 UNIT/ML IV SOLN
3.0000 10*6.[IU] | Freq: Once | INTRAVENOUS | Status: AC
  Administered 2022-03-26: 3 10*6.[IU] via INTRAVENOUS
  Filled 2022-03-26 (×2): qty 50

## 2022-03-26 MED ORDER — MIDODRINE HCL 5 MG PO TABS
5.0000 mg | ORAL_TABLET | Freq: Three times a day (TID) | ORAL | Status: DC
  Administered 2022-03-26 – 2022-03-31 (×14): 5 mg via ORAL
  Filled 2022-03-26 (×15): qty 1

## 2022-03-26 MED ORDER — RIVAROXABAN 15 MG PO TABS
15.0000 mg | ORAL_TABLET | Freq: Two times a day (BID) | ORAL | Status: DC
  Administered 2022-03-26 – 2022-03-31 (×10): 15 mg via ORAL
  Filled 2022-03-26 (×11): qty 1

## 2022-03-26 MED ORDER — POTASSIUM CHLORIDE IN NACL 20-0.9 MEQ/L-% IV SOLN
INTRAVENOUS | Status: DC
  Filled 2022-03-26 (×2): qty 1000

## 2022-03-26 MED ORDER — SODIUM CHLORIDE 0.9 % IV SOLN
2.0000 g | INTRAVENOUS | Status: DC
  Filled 2022-03-26: qty 20

## 2022-03-26 MED ORDER — OXYCODONE-ACETAMINOPHEN 5-325 MG PO TABS
1.0000 | ORAL_TABLET | ORAL | Status: AC
  Administered 2022-03-26: 1 via ORAL
  Filled 2022-03-26: qty 1

## 2022-03-26 MED ORDER — RIVAROXABAN 20 MG PO TABS: 20.0000 mg | ORAL_TABLET | Freq: Every day | ORAL | Status: DC

## 2022-03-26 MED ORDER — LIDOCAINE 5 % EX PTCH
1.0000 | MEDICATED_PATCH | CUTANEOUS | Status: DC
  Administered 2022-03-26 – 2022-03-28 (×2): 1 via TRANSDERMAL
  Filled 2022-03-26 (×4): qty 1

## 2022-03-26 MED ORDER — PENICILLIN G POTASSIUM 20000000 UNITS IJ SOLR
12.0000 10*6.[IU] | Freq: Two times a day (BID) | INTRAVENOUS | Status: AC
  Administered 2022-03-27 – 2022-03-30 (×8): 12 10*6.[IU] via INTRAVENOUS
  Filled 2022-03-26 (×10): qty 12

## 2022-03-26 NOTE — Progress Notes (Signed)
*  PRELIMINARY RESULTS* ?Echocardiogram ?2D Echocardiogram has been performed. ? ?Kristi [name] ?03/26/2022, 8:08 AM ?

## 2022-03-26 NOTE — Progress Notes (Addendum)
[facility] Progress Note ? ?Patient: Kristi [name] [facility]    TKZ:[id number]  DOA: 03/24/2022    ?Date of Service: the patient was seen and examined on 03/26/2022 ? 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  She presented to the emergency room on 4/3 with complaints of weakness.  At that time, blood cultures were done.  Patient sent back to skilled nursing facility.  Those blood cultures came back positive for Streptococcus species in both bottles and patient sent back to the emergency room on the night of 4/4.  Repeat blood cultures were done.  Patient noted to have soft blood pressures with systolic in the 57D to 22G.  Patient given IV fluids and IV antibiotics. ? ?Assessment and Plan: ?Assessment and Plan: ?* Streptococcal bacteremia ?Admitted to the hospitalist service.  Started on IV Rocephin.  Infectious disease consulted.  Continue IV fluids.  Lactic acid level normalized.  Unclear etiology.  No evidence of GI or pneumonia infection.  2D echo unremarkable.  Needs TEE. ? ?Hypotension ?An ongoing issue.  Does not appear to be sepsis.  Patient previously had been on midodrine.  Will resume. ? ?Dyslipidemia ?- We will continue statin therapy ? ?Acute pulmonary embolism ([place]) ?Found during last hospitalization.  Started on Xarelto which we will continue. ? ?Hypokalemia ?Replacing as needed. ? ?GERD without esophagitis ?- We will continue Carafate and PPI therapy. ? ?Small cell lung cancer ([place]) ?No chemo at this time until patient improves. ? ?Grade I diastolic dysfunction ?Euvolemic.  BNP within normal limits. ? 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  Pain improved with lidocaine patch. ? ? ? ? ? ? ?There is no height or weight on file to calculate BMI.   ?  ?   ? ?Consultants: ?Infectious disease ? ?Procedures: ?Echocardiogram unremarkable.  Grade 1 diastolic dysfunction noted. ? ?Antimicrobials: ?IV vancomycin 4/4 x 1 dose ?IV Rocephin 4/4-present ? ?Code Status: DNR ? ? ?Subjective: Patient okay, no complaints. ? ?Objective: ?Soft blood pressures noted ?Vitals:  ? 03/26/22 1158 03/26/22 1638  ?BP: 93/61 (!) 87/66  ?Pulse: 77 72  ?Resp: 16 16  ?Temp: 98 ?F (36.7 ?C) 97.9 ?F (36.6 ?C)  ?SpO2: 95% 100%  ? ? ?Intake/Output Summary (Last 24 hours) at 03/26/2022 1818 ?Last data filed at 03/26/2022 1355 ?Gross per 24 hour  ?Intake 2374.58 ml  ?Output --  ?Net 2374.58 ml  ? ? ?There were no vitals filed for this visit. ?There is no height or weight on file to calculate BMI. ? ?Exam: ? ?General: Alert and oriented x3, no acute distress ?HEENT: Normocephalic atraumatic, mucous membranes are moist ?Cardiovascular: Regular rate and rhythm, S1-S2 ?Respiratory: Clear to auscultation bilaterally ?Abdomen: Soft, nontender, nondistended, positive bowel sounds ?Musculoskeletal: No clubbing or cyanosis or edema ?Skin: No skin breaks, tears or lesions ?Psychiatry: Appropriate, no evidence of psychoses ?Neurology: Lower extremity weakness, present from previous hospitalization. ? ?Data Reviewed: ?Today's labs note normal BNP, normal procalcitonin.  Mild hypokalemia ? ?Disposition:  ?Status is: Inpatient ?Remains inpatient appropriate because: Treatment of bacteremia ?  ? ?Anticipated discharge date: 4/10 ? ?Remaining issues to be resolved so that patient can be discharged: Treatment of bacteremia ? ? ?Family Communication: Updated husband at bedside ?DVT Prophylaxis: ? ?Rivaroxaban (XARELTO) tablet 15 mg  ?rivaroxaban (  XARELTO) tablet 20 mg  ? ? ?Author: ?Annita Brod ,MD ?03/26/2022 6:18 PM ? ?To reach On-call, see care teams to locate the attending and reach out via [email or web address]. ?Between 7PM-7AM, please contact night-coverage ?If you still have difficulty reaching the attending  provider, please page the [facility] (Director on Call) for [facility] on amion for assistance. ? ?

## 2022-03-26 NOTE — Progress Notes (Addendum)
? ?Date of Admission:  03/24/2022    ? ?ID: Kristi [name] is a 76 y.o. female Principal Problem: ?  Streptococcal bacteremia ?Active Problems: ?  Grade I diastolic dysfunction ?  Small cell lung cancer ([place]) ?  GERD without esophagitis ?  Acute pulmonary embolism ([facility]) ?  Closed T7 fracture ([place]) ?  Hypotension ?  Hypokalemia ?  Dyslipidemia ? ? ? ?Subjective: ?Says she is feeling better than morning ?Some weakness ?Appetite much better ?Has mid thoracic back pain ? ?Medications:  ? lidocaine  1 patch Transdermal Q24H  ? rivaroxaban  15 mg Oral BID  ? Followed by  ? [START ON 04/05/2022] rivaroxaban  20 mg Oral Q supper  ? ? ?Objective: ?Vital signs in last 24 hours: ?Temp:  [97.6 ?F (36.4 ?C)-98.1 ?F (36.7 ?C)] 98 ?F (36.7 ?C) (04/06 1158) ?Pulse Rate:  [73-82] 77 (04/06 1158) ?Resp:  [16-18] 16 (04/06 1158) ?BP: (93-132)/(61-84) 93/61 (04/06 1158) ?SpO2:  [95 %-100 %] 95 % (04/06 1158) ? ?PHYSICAL EXAM:  ?General: Alert, cooperative, chronically ill ?Head: Normocephalic, without obvious abnormality, atraumatic. ?Eyes: Conjunctivae clear, anicteric sclerae. Pupils are equal ?ENT Nares normal. No drainage or sinus tenderness. ?Lips, mucosa, and tongue normal. No Thrush ?Neck: Supple, symmetrical, no adenopathy, thyroid: non tender ?no carotid bruit and no JVD. ?Back: tenderness mid back ?Lungs:b/l air entry ?Heart: s1s2 ?Abdomen: Soft, non-tender,not distended. Bowel sounds normal. No masses ?Extremities: atraumatic, no cyanosis. No edema. No clubbing ?Skin: No rashes or lesions. Or bruising ?Lymph: Cervical, supraclavicular normal. ?Neurologic: Grossly non-focal ? ?Lab Results ?Recent Labs  ?  03/24/22 ?2151 03/26/22 ?0718  ?WBC 6.0 5.3  ?HGB 10.2* 9.7*  ?HCT 31.4* 29.8*  ?NA 140 140  ?K 3.4* 3.3*  ?CL 109 111  ?CO2 23 21*  ?BUN 11 7*  ?CREATININE 1.10* 0.86  ? ?Liver Panel ?Recent Labs  ?  03/24/22 ?4008 03/24/22 ?2151  ?PROT 6.6 7.3  ?ALBUMIN 3.3* 3.4*  ?AST 78* 75*  ?ALT 58* 55*  ?ALKPHOS 98 97  ?BILITOT 0.5  0.5  ? ? ?Microbiology: ?BC- strep oralis ?Studies/Results: ?CT ABDOMEN PELVIS W CONTRAST ? ?Result Date: 03/24/2022 ?CLINICAL DATA:  Acute nonlocalized abdominal pain. Weakness, fatigue, and hypotension. EXAM: CT ABDOMEN AND PELVIS WITH CONTRAST TECHNIQUE: Multidetector CT imaging of the abdomen and pelvis was performed using the standard protocol following bolus administration of intravenous contrast. RADIATION DOSE REDUCTION: This exam was performed according to the departmental dose-optimization program which includes automated exposure control, adjustment of the mA and/or kV according to patient size and/or use of iterative reconstruction technique. CONTRAST:  128mL OMNIPAQUE IOHEXOL 300 MG/ML  SOLN COMPARISON:  04/29/2020 FINDINGS: Lower chest: Scarring in the lung bases. Small esophageal hiatal hernia. Hepatobiliary: No focal liver abnormality is seen. Status post cholecystectomy. No biliary dilatation. Pancreas: Unremarkable. No pancreatic ductal dilatation or surrounding inflammatory changes. Spleen: Normal in size without focal abnormality. Adrenals/Urinary Tract: Adrenal glands are unremarkable. 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 Kidneys are otherwise normal, without renal calculi, focal lesion, or hydronephrosis. Bladder is unremarkable. Stomach/Bowel: Stomach, small bowel, and colon are not abnormally distended. Scattered colonic diverticula without evidence of acute diverticulitis. No wall thickening or inflammatory changes. Appendix is not identified. Vascular/Lymphatic: Aortic atherosclerosis. No enlarged abdominal or pelvic lymph nodes. Reproductive: Uterus and bilateral adnexa are unremarkable. Other: No abdominal wall hernia or abnormality. No abdominopelvic ascites. Mesh hernia repair of the anterior abdominal wall. Musculoskeletal: No acute or significant osseous findings. IMPRESSION: 1. No evidence of bowel obstruction  or inflammation. 2. Small esophageal hiatal hernia. 3. Aortic  atherosclerosis. Electronically Signed   By: Lucienne Capers M.D.   On: 03/24/2022 23:25  ? ?DG Chest Portable 1 View ? ?Result Date: 03/24/2022 ?CLINICAL DATA:  Dyspnea EXAM: PORTABLE CHEST 1 VIEW COMPARISON:  03/24/2022 FINDINGS: Lungs are clear. No pneumothorax or pleural effusion. Right internal jugular chest port tip is seen at the superior cavoatrial junction. Cardiac size within normal limits. Pulmonary vascularity is normal. IMPRESSION: No active disease. Electronically Signed   By: Fidela Salisbury M.D.   On: 03/24/2022 23:02  ? ?ECHOCARDIOGRAM COMPLETE ? ?Result Date: 03/26/2022 ?   ECHOCARDIOGRAM REPORT   Patient Name:   Kristi [name] [facility] Date of Exam: 03/26/2022 Medical Rec #:  [id number]        Height:       65.0 in Accession #:    [id number]       Weight:       145.0 lb Date of Birth:  1946/03/16       BSA:          1.725 m? Patient Age:    30 years         BP:           116/80 mmHg Patient Gender: F                HR:           72 bpm. Exam Location:  [facility] Procedure: 2D Echo, Color Doppler and Cardiac Doppler Indications:     R78.81 Bacteremia  History:         Patient has prior history of Echocardiogram examinations, most                  recent 01/15/2022. CAD; Risk Factors:HCL.  Sonographer:     Charmayne Sheer Referring Phys:  [id number] Tsosie Billing Diagnosing Phys: Ida Rogue MD  Sonographer Comments: Suboptimal parasternal window. IMPRESSIONS  1. Left ventricular ejection fraction, by estimation, is 60 to 65%. The left ventricle has normal function. The left ventricle has no regional wall motion abnormalities. Left ventricular diastolic parameters are consistent with Grade I diastolic dysfunction (impaired relaxation).  2. Right ventricular systolic function is normal. The right ventricular size is normal.  3. The mitral valve is normal in structure. Mild mitral valve regurgitation. No evidence of mitral stenosis.  4. The aortic valve is normal in structure. There is mild calcification of the aortic  valve. Aortic valve regurgitation is not visualized. Aortic valve sclerosis is present, with no evidence of aortic valve stenosis.  5. The inferior vena cava is normal in size with greater than 50% respiratory variability, suggesting right atrial pressure of 3 mmHg.  6. No valve vegetation noted. FINDINGS  Left Ventricle: Left ventricular ejection fraction, by estimation, is 60 to 65%. The left ventricle has normal function. The left ventricle has no regional wall motion abnormalities. The left ventricular internal cavity size was normal in size. There is  no left ventricular hypertrophy. Left ventricular diastolic parameters are consistent with Grade I diastolic dysfunction (impaired relaxation). Right Ventricle: The right ventricular size is normal. No increase in right ventricular wall thickness. Right ventricular systolic function is normal. Left Atrium: Left atrial size was normal in size. Right Atrium: Right atrial size was normal in size. Pericardium: There is no evidence of pericardial effusion. Mitral Valve: The mitral valve is normal in structure. Mild mitral valve regurgitation. No evidence of mitral valve stenosis. MV peak gradient, 2.7 mmHg. The  mean mitral valve gradient is 1.0 mmHg. Tricuspid Valve: The tricuspid valve is normal in structure. Tricuspid valve regurgitation is not demonstrated. No evidence of tricuspid stenosis. Aortic Valve: The aortic valve is normal in structure. There is mild calcification of the aortic valve. Aortic valve regurgitation is not visualized. Aortic valve sclerosis is present, with no evidence of aortic valve stenosis. Aortic valve mean gradient  measures 8.0 mmHg. Aortic valve peak gradient measures 15.7 mmHg. Aortic valve area, by VTI measures 1.92 cm?. Pulmonic Valve: The pulmonic valve was normal in structure. Pulmonic valve regurgitation is not visualized. No evidence of pulmonic stenosis. Aorta: The aortic root is normal in size and structure. Venous: The inferior  vena cava is normal in size with greater than 50% respiratory variability, suggesting right atrial pressure of 3 mmHg. IAS/Shunts: No atrial level shunt detected by color flow Doppler.  LEFT VENTRICL

## 2022-03-26 NOTE — Progress Notes (Signed)
VAST RN reached out to Ravishanker, MD to determine when/if port can be accessed/used; she responded patient needs to have a TEE completed and it needs to be clear before port can be accessed and used. ?Assessed patient's current IV access; blood return noted and flushed easily.  ?Contacted pharmacy to determine compatibility of current IVFs and Penicillin G potassium; pharmacy confirmed everything is compatible.  ?Unit RN notified.  ?

## 2022-03-27 ENCOUNTER — Inpatient Hospital Stay: Payer: Medicare Other

## 2022-03-27 DIAGNOSIS — I5189 Other ill-defined heart diseases: Secondary | ICD-10-CM | POA: Diagnosis not present

## 2022-03-27 DIAGNOSIS — R7881 Bacteremia: Secondary | ICD-10-CM | POA: Diagnosis not present

## 2022-03-27 DIAGNOSIS — C3490 Malignant neoplasm of unspecified part of unspecified bronchus or lung: Secondary | ICD-10-CM | POA: Diagnosis not present

## 2022-03-27 DIAGNOSIS — I959 Hypotension, unspecified: Secondary | ICD-10-CM | POA: Diagnosis not present

## 2022-03-27 MED ORDER — SERTRALINE HCL 50 MG PO TABS
100.0000 mg | ORAL_TABLET | Freq: Every day | ORAL | Status: DC
  Administered 2022-03-27 – 2022-03-31 (×5): 100 mg via ORAL
  Filled 2022-03-27 (×5): qty 2

## 2022-03-27 MED ORDER — ATORVASTATIN CALCIUM 20 MG PO TABS
40.0000 mg | ORAL_TABLET | Freq: Every day | ORAL | Status: DC
  Administered 2022-03-27 – 2022-03-30 (×4): 40 mg via ORAL
  Filled 2022-03-27 (×4): qty 2

## 2022-03-27 MED ORDER — GADOBUTROL 1 MMOL/ML IV SOLN
6.0000 mL | Freq: Once | INTRAVENOUS | Status: AC | PRN
  Administered 2022-03-27: 6 mL via INTRAVENOUS

## 2022-03-27 MED ORDER — FLUDROCORTISONE ACETATE 0.1 MG PO TABS: 0.1000 mg | ORAL_TABLET | Freq: Every day | ORAL | Status: DC

## 2022-03-27 NOTE — Progress Notes (Signed)
[facility] Progress Note ? ?Patient: Kristi [name]    GGY:[id number]  DOA: 03/24/2022    ?Date of Service: the patient was seen and examined on 03/27/2022 ? 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  She presented to the emergency room on 4/3 with complaints of weakness.  At that time, blood cultures were done.  Patient sent back to skilled nursing facility.  Those blood cultures came back positive for Streptococcus species in both bottles and patient sent back to the emergency room on the night of 4/4.  Repeat blood cultures were done.  Patient noted to have soft blood pressures with systolic in the 03J to 00X.  Patient given IV fluids and IV antibiotics. ? ?Assessment and Plan: ?Assessment and Plan: ?* Streptococcal bacteremia ?Admitted to the hospitalist service.  Started on IV Rocephin.  Infectious disease consulted.  Continue IV fluids.  Lactic acid level normalized.  Unclear etiology.  No evidence of GI or pneumonia infection.  2D echo unremarkable.  Needs TEE-cardiology consulted. ? ?Hypotension ?An ongoing issue.  Does not appear to be sepsis.  Previously been on midodrine, so have restarted. ? ?Dyslipidemia ?- We will continue statin therapy ? ?Acute pulmonary embolism ([place]) ?Found during last hospitalization.  Started on Xarelto which we will continue. ? ?Hypokalemia ?Replacing as needed. ? ?GERD without esophagitis ?- We will continue Carafate and PPI therapy. ? ?Small cell lung cancer ([place]) ?No chemo at this time until patient improves. ? ?Grade I diastolic dysfunction ?Euvolemic.  BNP within normal limits. ? 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  Pain improved with lidocaine patch. ? ? ? ? ? ? ?Body mass index is 24.14 kg/m?.  ?  ?    ? ?Consultants: ?Infectious disease ? ?Procedures: ?Echocardiogram unremarkable.  Grade 1 diastolic dysfunction noted. ?TEE pending ? ?Antimicrobials: ?IV vancomycin 4/4 x 1 dose ?IV Rocephin 4/4-present ? ?Code Status: DNR ? ? ?Subjective: Patient okay, no complaints. ? ?Objective: ?Soft blood pressures noted ?Vitals:  ? 03/27/22 0840 03/27/22 1202  ?BP: 105/67 97/62  ?Pulse: 73 74  ?Resp: 20 20  ?Temp: 98.1 ?F (36.7 ?C) 98 ?F (36.7 ?C)  ?SpO2: 95% 92%  ? ? ?Intake/Output Summary (Last 24 hours) at 03/27/2022 1335 ?Last data filed at 03/27/2022 1048 ?Gross per 24 hour  ?Intake 925.64 ml  ?Output 850 ml  ?Net 75.64 ml  ? ? ?Filed Weights  ? 03/27/22 0701  ?Weight: 65.8 kg  ? ?Body mass index is 24.14 kg/m?. ? ?Exam: ? ?General: Alert and oriented x3, no acute distress ?HEENT: Normocephalic atraumatic, mucous membranes are moist ?Cardiovascular: Regular rate and rhythm, S1-S2 ?Respiratory: Clear to auscultation bilaterally ?Abdomen: Soft, nontender, nondistended, positive bowel sounds ?Musculoskeletal: No clubbing or cyanosis or edema ?Skin: No skin breaks, tears or lesions ?Psychiatry: Appropriate, no evidence of psychoses ?Neurology: Lower extremity weakness, present from previous hospitalization. ? ?Data Reviewed: ?No labs today. ? ?Disposition:  ?Status is: Inpatient ?Remains inpatient appropriate because: Treatment of bacteremia ?  ? ?Anticipated discharge date: 4/11 ? ?Remaining issues to be resolved so that patient can be discharged: Treatment of bacteremia.  TEE ? ? ?Family Communication: Left message for husband ?DVT Prophylaxis: ? ?Rivaroxaban (XARELTO) tablet 15 mg  ?rivaroxaban (XARELTO) tablet 20 mg  ? ? ?Author: ?Trenton Gammon  [name] ,MD ?03/27/2022 1:35 PM ? ?To reach On-call, see care teams to locate the attending and reach out via [email or web address]. ?Between 7PM-7AM, please contact night-coverage ?If you still have difficulty reaching the attending provider, please page the [facility] (Director on Call) for [facility] on amion for assistance. ? ?

## 2022-03-27 NOTE — Progress Notes (Signed)
Mobility Specialist - Progress Note ? ? 03/27/22 1400  ?Mobility  ?Activity Transferred from chair to bed;Ambulated with assistance in room  ?Level of Assistance Standby assist, set-up cues, supervision of patient - no hands on  ?Assistive Device Front wheel walker  ?Distance Ambulated (ft) 5 ft  ?Activity Response Tolerated well  ?$Mobility charge 1 Mobility  ? ? ? ?Post-mobility: 87 HR, 97% SpO2 ? ? ?Pt ambulated bed-chair with supervision. MinA STS--VC for hand placement. VC for turning/retro stepping with RW. Pt returned supine with assist on LE. Pt left in bed with alarm set, family at bedside.  ? ? ?Kathee Delton ?Mobility Specialist ?03/27/22, 2:39 PM ? ?

## 2022-03-27 NOTE — Consult Note (Signed)
? ? ?Kristi [name] is a 76 y.o. female  [id number] ? ?Primary Cardiologist: Demario Faniel ?Reason for Consultation: Rule out endocarditis ? ?HPI: This 76 year old white female with a history of small cell lung cancer history of coronary artery disease has streptococcal infection with positive blood cultures.  Patient apparently is high risk for endocarditis thus I was asked to evaluate the for transesophageal echocardiogram. ? ? ?Review of Systems: No chest pain ? ? ?Past Medical History:  ?Diagnosis Date  ? 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 1 stent  ? Claustrophobia   ? Coronary artery disease   ? GERD (gastroesophageal reflux disease)   ? Hypercholesteremia   ? MI, old   ? Pneumonia   ? Small cell lung cancer ([place])   ? 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 [facility]) 10/27/2018  ? ? ?Medications Prior to Admission  ?Medication Sig Dispense Refill  ? aspirin EC 81 MG tablet Take 1 tablet (81 mg total) by mouth daily. Swallow whole. 150 tablet 2  ? atorvastatin (LIPITOR) 40 MG tablet Take 1 tablet (40 mg total) by mouth daily. 30 tablet 3  ? Calcium 600-200 MG-UNIT tablet Take 1 tablet by mouth 2 (two) times daily.    ? cephALEXin (KEFLEX) 250 MG capsule Take 1 capsule (250 mg total) by mouth 3 (three) times daily. 21 capsule 0  ? Cholecalciferol (DIALYVITE VITAMIN D 5000) 125 MCG (5000 UT) capsule Take 5,000 Units by mouth daily.    ? fludrocortisone (FLORINEF) 0.1 MG tablet Take 0.1 mg by mouth daily.    ? lidocaine (LIDODERM) 5 % Place 1 patch onto the skin daily. Remove & Discard patch within 12 hours or as directed by MD 30 patch 0  ? Magnesium 250 MG TABS Take 250 mg by mouth daily.    ? pantoprazole (PROTONIX) 40 MG tablet Take 1 tablet (40 mg total) by mouth 2 (two) times daily. 60 tablet 0  ? Rivaroxaban (XARELTO) 15 MG TABS tablet Take 1 tablet (15 mg total) by mouth 2 (two) times daily with a meal for 18 days. 36 tablet 0  ? sertraline (ZOLOFT) 100 MG tablet Take 1 tablet (100 mg total) by mouth  daily. 90 tablet 3  ? traZODone (DESYREL) 50 MG tablet Take 1 tablet (50 mg total) by mouth at bedtime as needed for sleep. 30 tablet 2  ? vitamin B-12 (CYANOCOBALAMIN) 1000 MCG tablet Take 1,000 mcg by mouth daily.    ? acetaminophen (TYLENOL) 500 MG tablet Take 2 tablets (1,000 mg total) by mouth every 6 (six) hours as needed for mild pain or headache.    ? alum & mag hydroxide-simeth (MAALOX/MYLANTA) 200-200-20 MG/5ML suspension Take 30 mLs by mouth every 6 (six) hours as needed for indigestion or heartburn. 355 mL 0  ? diphenhydrAMINE (BENADRYL) 25 mg capsule Take 1 capsule (25 mg total) by mouth at bedtime as needed (if no sleep even with trazodone). 30 capsule 0  ? lidocaine-prilocaine (EMLA) cream Apply 1 application topically daily as needed (port access). 30 g 2  ? polyethylene glycol (MIRALAX / GLYCOLAX) 17 g packet Take 17 g by mouth daily as needed for mild constipation. 14 each 0  ? prochlorperazine (COMPAZINE) 10 MG tablet Take 10 mg by mouth every 6 (six) hours as needed for nausea or vomiting.    ? [START ON 04/05/2022] rivaroxaban (XARELTO) 20 MG TABS tablet Take 1 tablet (20 mg total) by mouth daily with supper. 30 tablet 3  ? sucralfate (CARAFATE)  1 g tablet Take 1 g by mouth 3 (three) times daily as needed (heartburn).    ? ? ? ? atorvastatin  40 mg Oral Daily  ? lidocaine  1 patch Transdermal Q24H  ? midodrine  5 mg Oral TID WC  ? rivaroxaban  15 mg Oral BID  ? Followed by  ? [START ON 04/05/2022] rivaroxaban  20 mg Oral Q supper  ? sertraline  100 mg Oral Daily  ? ? ?Infusions: ? 0.9 % NaCl with KCl 20 mEq / L 10 mL/hr at 03/26/22 2107  ? penicillin g continuous IV infusion 12 Million Units (03/27/22 8299)  ? ? ?No Known Allergies ? ?Social History  ? ?Socioeconomic History  ? Marital status: Married  ?  Spouse name: [name]   ? Number of children: 3  ? Years of education: Not on file  ? Highest education level: Not on file  ?Occupational History  ? Occupation: Retired  ?  Comment: Chief Technology Officer    ?Tobacco Use  ? Smoking status: Former  ?  Packs/day: 1.00  ?  Years: 39.00  ?  Pack years: 39.00  ?  Types: Cigarettes  ?  Start date: 12/21/1978  ?  Quit date: 10/16/2018  ?  Years since quitting: 3.4  ? Smokeless tobacco: Never  ?Vaping Use  ? Vaping Use: Never used  ?Substance and Sexual Activity  ? Alcohol use: Yes  ?  Alcohol/week: 0.0 standard drinks  ?  Comment: occassional - approx 1 every 2 weeks   ? Drug use: No  ? Sexual activity: Yes  ?  Birth control/protection: None  ?Other Topics Concern  ? Not on file  ?Social History Narrative  ? Not on file  ? ?Social Determinants of Health  ? ?Financial Resource Strain: Not on file  ?Food Insecurity: Not on file  ?Transportation Needs: Not on file  ?Physical Activity: Not on file  ?Stress: Not on file  ?Social Connections: Not on file  ?Intimate Partner Violence: Not on file  ? ? ?Family History  ?Problem Relation Age of Onset  ? Alzheimer's disease Mother   ? Colon cancer Father   ? Breast cancer Neg Hx   ? ? ?PHYSICAL EXAM: ?Vitals:  ? 03/27/22 1202 03/27/22 1659  ?BP: 97/62 (!) 87/62  ?Pulse: 74 79  ?Resp: 20 18  ?Temp: 98 ?F (36.7 ?C) 98 ?F (36.7 ?C)  ?SpO2: 92% 96%  ? ? ? ?Intake/Output Summary (Last 24 hours) at 03/27/2022 1739 ?Last data filed at 03/27/2022 1048 ?Gross per 24 hour  ?Intake 825.64 ml  ?Output 850 ml  ?Net -24.36 ml  ? ? ?General:  Well appearing. No respiratory difficulty ?HEENT: normal ?Neck: supple. no JVD. Carotids 2+ bilat; no bruits. No lymphadenopathy or thryomegaly appreciated. ?Cor: PMI nondisplaced. Regular rate & rhythm. No rubs, gallops or murmurs. ?Lungs: clear ?Abdomen: soft, nontender, nondistended. No hepatosplenomegaly. No bruits or masses. Good bowel sounds. ?Extremities: no cyanosis, clubbing, rash, edema ?Neuro: alert & oriented x 3, cranial nerves grossly intact. moves all 4 extremities w/o difficulty. Affect pleasant. ? ?ECG: Atrial fibrillation with nonspecific ST changes ? ?No results found for this or any previous visit  (from the past 24 hour(s)). ?ECHOCARDIOGRAM COMPLETE ? ?Result Date: 03/26/2022 ?   ECHOCARDIOGRAM REPORT   Patient Name:   SHADIA LAROSE [facility] Date of Exam: 03/26/2022 Medical Rec #:  [id number]        Height:       65.0 in Accession #:    [id number]  Weight:       145.0 lb Date of Birth:  Oct 30, 1946       BSA:          1.725 m? Patient Age:    74 years         BP:           116/80 mmHg Patient Gender: F                HR:           72 bpm. Exam Location:  [facility] Procedure: 2D Echo, Color Doppler and Cardiac Doppler Indications:     R78.81 Bacteremia  History:         Patient has prior history of Echocardiogram examinations, most                  recent 01/15/2022. CAD; Risk Factors:HCL.  Sonographer:     Charmayne Sheer Referring Phys:  [id number] Tsosie Billing Diagnosing Phys: Ida Rogue MD  Sonographer Comments: Suboptimal parasternal window. IMPRESSIONS  1. Left ventricular ejection fraction, by estimation, is 60 to 65%. The left ventricle has normal function. The left ventricle has no regional wall motion abnormalities. Left ventricular diastolic parameters are consistent with Grade I diastolic dysfunction (impaired relaxation).  2. Right ventricular systolic function is normal. The right ventricular size is normal.  3. The mitral valve is normal in structure. Mild mitral valve regurgitation. No evidence of mitral stenosis.  4. The aortic valve is normal in structure. There is mild calcification of the aortic valve. Aortic valve regurgitation is not visualized. Aortic valve sclerosis is present, with no evidence of aortic valve stenosis.  5. The inferior vena cava is normal in size with greater than 50% respiratory variability, suggesting right atrial pressure of 3 mmHg.  6. No valve vegetation noted. FINDINGS  Left Ventricle: Left ventricular ejection fraction, by estimation, is 60 to 65%. The left ventricle has normal function. The left ventricle has no regional wall motion abnormalities. The left ventricular  internal cavity size was normal in size. There is  no left ventricular hypertrophy. Left ventricular diastolic parameters are consistent with Grade I diastolic dysfunction (impaired relaxation). Right Vent

## 2022-03-27 NOTE — Care Management Important Message (Signed)
Important Message ? ?Patient Details  ?Name: Kristi [name] ?MRN: [id number] ?Date of Birth: 1946-09-21 ? ? ?Medicare Important Message Given:  Yes ? ? ? ? ?Dannette Barbara ?03/27/2022, 12:19 PM ?

## 2022-03-27 NOTE — TOC Initial Note (Signed)
Transition of Care (TOC) - Initial/Assessment Note  ? ? ?Patient Details  ?Name: Kristi [name] [facility] ?MRN: [id number] ?Date of Birth: [birth year] ? ?Transition of Care (TOC) CM/SW Contact:    ?Laurena Slimmer, RN ?Phone Number: ?03/27/2022, 2:31 PM ? ?Clinical Narrative:                 ?Spoke with patient and husband at bedside. Patient does not wish to return to [facility]. She wants to return home with [facility]. Husband will transport home and for appointments  ? ?PCP: Gwinda Passe ?Pharmacy:[facility],[place] ?Current home health/prior home health/DME:[facility],  WC, cane ? ?Expected Discharge Plan: [place] ?Barriers to Discharge: Continued Medical Work up ? ? ?Patient Goals and CMS Choice ?  ?  ?Choice offered to / list presented to : Patient, Spouse ? ?Expected Discharge Plan and Services ?Expected Discharge Plan: [place] ?  ?  ?  ?Living arrangements for the past 2 months: [place], [place] ?                ?  ?  ?  ?  ?  ?  ?  ?  ?  ?  ? ?Prior Living Arrangements/Services ?Living arrangements for the past 2 months: [place], [place] ?  ?Patient language and need for interpreter reviewed:: Yes ?Do you feel safe going back to the place where you live?: Yes      ?Need for Family Participation in Patient Care: Yes (Comment) ?Care giver support system in place?: Yes (comment) ?  ?Criminal Activity/Legal Involvement Pertinent to Current Situation/Hospitalization: No - Comment as needed ? ?Activities of Daily Living ?Home Assistive Devices/Equipment: Chana Bode (specify type), Shower chair with back ?ADL Screening (condition at time of admission) ?Patient's cognitive ability adequate to safely complete daily activities?: Yes ?Is the patient deaf or have difficulty hearing?: Yes ?Does the patient have difficulty seeing, even when wearing glasses/contacts?: No ?Does the patient have difficulty concentrating, remembering, or making  decisions?: No ?Patient able to express need for assistance with ADLs?: Yes ?Does the patient have difficulty dressing or bathing?: Yes ?Independently performs ADLs?: No ?Communication: Independent ?Dressing (OT): Needs assistance ?Is this a change from baseline?: Pre-admission baseline ?Grooming: Needs assistance ?Is this a change from baseline?: Pre-admission baseline ?Feeding: Independent ?Bathing: Needs assistance ?Is this a change from baseline?: Pre-admission baseline ?Toileting: Needs assistance ?Is this a change from baseline?: Pre-admission baseline ?In/Out Bed: Needs assistance ?Is this a change from baseline?: Pre-admission baseline ?Walks in Home: Independent with device (comment) ?Does the patient have difficulty walking or climbing stairs?: Yes ?Weakness of Legs: Both ?Weakness of Arms/Hands: None ? ?Permission Sought/Granted ?  ?  ?   ?   ?   ?   ? ?Emotional Assessment ?Appearance:: Appears older than stated age ?Attitude/Demeanor/Rapport: Engaged, Gracious ?Affect (typically observed): Accepting ?Orientation: : Oriented to Self, Oriented to Place, Oriented to  Time, Oriented to Situation ?Alcohol / Substance Use: Not Applicable ?Psych Involvement: No (comment) ? ?Admission diagnosis:  Bacteremia [R78.81] ?Streptococcal bacteremia [R78.81, B95.5] ?Patient Active Problem List  ? Diagnosis Date Noted  ? Streptococcal bacteremia 03/25/2022  ? Hypokalemia 03/25/2022  ? Dyslipidemia 03/25/2022  ? Sleep disturbance 03/16/2022  ? Malnutrition of moderate degree 03/14/2022  ? Hypotension 03/14/2022  ? Acute pulmonary embolism ([place]) 03/13/2022  ? 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 Closed T7 fracture ([place]) 03/13/2022  ? Leg weakness, bilateral 03/12/2022  ? Coronary  artery disease involving native coronary artery of native heart without angina pectoris 03/12/2022  ? Chronic diastolic CHF (congestive heart failure) ([place]) 03/12/2022  ? GERD without esophagitis 03/12/2022  ?  Cerebrovascular accident (CVA) ([place]) 12/19/2021  ? Brain ischemia 09/29/2021  ? MCI (mild cognitive impairment) 06/19/2021  ? Aortic atherosclerosis ([place]) 06/18/2021  ? Incisional hernia, without obstruction or gangrene   ? Thrombocytopenia ([place]) 02/12/2021  ? 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 Diarrhea 12/05/2020  ? Osteopenia 08/12/2020  ? Ventral hernia without obstruction or gangrene 08/12/2020  ? Thoracic compression fracture, closed, initial encounter ([place]) 01/22/2020  ? Frequent falls 01/22/2020  ? Encounter for antineoplastic immunotherapy 09/14/2019  ? Memory loss 09/14/2019  ? Other fatigue 09/14/2019  ? Cancer of hilus of left lung ([place]) 06/08/2019  ? Dehydration 01/30/2019  ? Anemia due to antineoplastic chemotherapy 01/09/2019  ? Encounter for antineoplastic chemotherapy 11/02/2018  ? Cough 10/28/2018  ? Dysphagia 10/28/2018  ? Decrease in appetite 10/28/2018  ? Goals of care, counseling/discussion 10/28/2018  ? Small cell lung cancer ([place]) 10/27/2018  ? Grade I diastolic dysfunction 16/12/930  ? Localized edema 08/29/2018  ? Cholecystitis 08/13/2018  ? Osteopenia determined by x-ray 05/19/2018  ? Prediabetes 02/08/2018  ? Hx of fracture of radius 08/18/2016  ? Vitamin D deficiency 01/07/2016  ? Mixed hyperlipidemia 01/02/2016  ? Chronic pain 01/02/2016  ? Coronary artery disease with stable angina pectoris ([place]) 01/02/2016  ? IBS (irritable bowel syndrome) 01/02/2016  ? FH: colon cancer 01/02/2016  ? DDD (degenerative disc disease), cervical 01/02/2016  ? Primary osteoarthritis of left knee 01/02/2016  ? Tobacco use disorder 01/02/2016  ? ?PCP:  Glean Hess, MD ?Pharmacy:   ?[facility] [place] ?[place] [address] ?Phone: [phone] Fax: [phone] ? ?[id number] - [place], [place] - [address] AT [place] ?[place]  [address] ?Phone: [phone] Fax: [phone] ? ? ? ? ?Social Determinants of Health (SDOH) Interventions ?  ? ?Readmission Risk Interventions ? ?  03/27/2022  ?  2:30 PM  ?Readmission Risk Prevention Plan  ?Transportation Screening Complete  ?PCP or Specialist Appt within 3-5 Days Complete  ?Social Work Consult for [place] Planning/Counseling Complete  ?Palliative Care Screening Not Applicable  ?Medication Review Press photographer) Complete  ? ? ? ?

## 2022-03-27 NOTE — Progress Notes (Signed)
? ?Date of Admission:  03/24/2022    ? ?ID: Kristi [name] is a 76 y.o. female Principal Problem: ?  Streptococcal bacteremia ?Active Problems: ?  Grade I diastolic dysfunction ?  Small cell lung cancer ([place]) ?  GERD without esophagitis ?  Acute pulmonary embolism ([place]) ?  Closed T7 fracture ([place]) ?  Hypotension ?  Hypokalemia ?  Dyslipidemia ? ? ? ?Subjective: ?Pt in MRI ?Medications:  ? atorvastatin  40 mg Oral Daily  ? lidocaine  1 patch Transdermal Q24H  ? midodrine  5 mg Oral TID WC  ? rivaroxaban  15 mg Oral BID  ? Followed by  ? [START ON 04/05/2022] rivaroxaban  20 mg Oral Q supper  ? sertraline  100 mg Oral Daily  ? ? ?Objective: ?Vital signs in last 24 hours: ?Temp:  [97.9 ?F (36.6 ?C)-98.4 ?F (36.9 ?C)] 98 ?F (36.7 ?C) (04/07 1202) ?Pulse Rate:  [71-76] 74 (04/07 1202) ?Resp:  [16-20] 20 (04/07 1202) ?BP: (87-117)/(62-72) 97/62 (04/07 1202) ?SpO2:  [92 %-100 %] 92 % (04/07 1202) ?Weight:  [65.8 kg] 65.8 kg (04/07 0701) ? ?Lab Results ?Recent Labs  ?  03/24/22 ?2151 03/26/22 ?0718  ?WBC 6.0 5.3  ?HGB 10.2* 9.7*  ?HCT 31.4* 29.8*  ?NA 140 140  ?K 3.4* 3.3*  ?CL 109 111  ?CO2 23 21*  ?BUN 11 7*  ?CREATININE 1.10* 0.86  ? ?Liver Panel ?Recent Labs  ?  03/24/22 ?2151  ?PROT 7.3  ?ALBUMIN 3.4*  ?AST 75*  ?ALT 55*  ?ALKPHOS 97  ?BILITOT 0.5  ? ? ?Microbiology: ?BC- strep oralis ?Studies/Results: ?ECHOCARDIOGRAM COMPLETE ? ?Result Date: 03/26/2022 ?   ECHOCARDIOGRAM REPORT   Patient Name:   Kristi [name] [facility] Date of Exam: 03/26/2022 Medical Rec #:  [id number]        Height:       65.0 in Accession #:    [id number]       Weight:       145.0 lb Date of Birth:  06-09-1946       BSA:          1.725 m? Patient Age:    49 years         BP:           116/80 mmHg Patient Gender: F                HR:           72 bpm. Exam Location:  [facility] Procedure: 2D Echo, Color Doppler and Cardiac Doppler Indications:     R78.81 Bacteremia  History:         Patient has prior history of Echocardiogram examinations, most                   recent 01/15/2022. CAD; Risk Factors:HCL.  Sonographer:     Charmayne Sheer Referring Phys:  [id number] Tsosie Billing Diagnosing Phys: Ida Rogue MD  Sonographer Comments: Suboptimal parasternal window. IMPRESSIONS  1. Left ventricular ejection fraction, by estimation, is 60 to 65%. The left ventricle has normal function. The left ventricle has no regional wall motion abnormalities. Left ventricular diastolic parameters are consistent with Grade I diastolic dysfunction (impaired relaxation).  2. Right ventricular systolic function is normal. The right ventricular size is normal.  3. The mitral valve is normal in structure. Mild mitral valve regurgitation. No evidence of mitral stenosis.  4. The aortic valve is normal in structure. There is mild calcification of the aortic valve. Aortic  valve regurgitation is not visualized. Aortic valve sclerosis is present, with no evidence of aortic valve stenosis.  5. The inferior vena cava is normal in size with greater than 50% respiratory variability, suggesting right atrial pressure of 3 mmHg.  6. No valve vegetation noted. FINDINGS  Left Ventricle: Left ventricular ejection fraction, by estimation, is 60 to 65%. The left ventricle has normal function. The left ventricle has no regional wall motion abnormalities. The left ventricular internal cavity size was normal in size. There is  no left ventricular hypertrophy. Left ventricular diastolic parameters are consistent with Grade I diastolic dysfunction (impaired relaxation). Right Ventricle: The right ventricular size is normal. No increase in right ventricular wall thickness. Right ventricular systolic function is normal. Left Atrium: Left atrial size was normal in size. Right Atrium: Right atrial size was normal in size. Pericardium: There is no evidence of pericardial effusion. Mitral Valve: The mitral valve is normal in structure. Mild mitral valve regurgitation. No evidence of mitral valve stenosis. MV peak gradient,  2.7 mmHg. The mean mitral valve gradient is 1.0 mmHg. Tricuspid Valve: The tricuspid valve is normal in structure. Tricuspid valve regurgitation is not demonstrated. No evidence of tricuspid stenosis. Aortic Valve: The aortic valve is normal in structure. There is mild calcification of the aortic valve. Aortic valve regurgitation is not visualized. Aortic valve sclerosis is present, with no evidence of aortic valve stenosis. Aortic valve mean gradient  measures 8.0 mmHg. Aortic valve peak gradient measures 15.7 mmHg. Aortic valve area, by VTI measures 1.92 cm?. Pulmonic Valve: The pulmonic valve was normal in structure. Pulmonic valve regurgitation is not visualized. No evidence of pulmonic stenosis. Aorta: The aortic root is normal in size and structure. Venous: The inferior vena cava is normal in size with greater than 50% respiratory variability, suggesting right atrial pressure of 3 mmHg. IAS/Shunts: No atrial level shunt detected by color flow Doppler.  LEFT VENTRICLE PLAX 2D LVIDd:         3.79 cm   Diastology LVIDs:         2.25 cm   LV e' medial:    6.53 cm/s LV PW:         0.65 cm   LV E/e' medial:  11.5 LV IVS:        0.57 cm   LV e' lateral:   8.05 cm/s LVOT diam:     2.30 cm   LV E/e' lateral: 9.3 LV SV:         78 LV SV Index:   45 LVOT Area:     4.15 cm?  RIGHT VENTRICLE RV Basal diam:  2.55 cm LEFT ATRIUM             Index LA diam:        3.20 cm 1.85 cm/m? LA Vol (A2C):   32.9 ml 19.07 ml/m? LA Vol (A4C):   20.7 ml 12.00 ml/m? LA Biplane Vol: 26.9 ml 15.59 ml/m?  AORTIC VALVE                     PULMONIC VALVE AV Area (Vmax):    1.93 cm?      PV Vmax:       0.82 m/s AV Area (Vmean):   1.70 cm?      PV Vmean:      62.600 cm/s AV Area (VTI):     1.92 cm?      PV VTI:        0.178 m AV  Vmax:           198.00 cm/s   PV Peak grad:  2.7 mmHg AV Vmean:          137.000 cm/s  PV Mean grad:  2.0 mmHg AV VTI:            0.405 m AV [place] Grad:      15.7 mmHg AV Mean Grad:      8.0 mmHg LVOT Vmax:         92.00  cm/s LVOT Vmean:        55.900 cm/s LVOT VTI:          0.187 m LVOT/AV VTI ratio: 0.46  AORTA Ao Root diam: 3.20 cm MITRAL VALVE MV Area (PHT): 3.54 cm?    SHUNTS MV Area VTI:   3.35 cm?    Systemic VTI:  0.19 m MV [place] grad:  2.7 mmHg    Systemic Diam: 2.30 cm MV Mean grad:  1.0 mmHg MV Vmax:       0.82 m/s MV Vmean:      56.1 cm/s MV Decel Time: 214 msec MV E velocity: 75.00 cm/s MV A velocity: 82.30 cm/s MV E/A ratio:  0.91 Ida Rogue MD Electronically signed by Ida Rogue MD Signature Date/Time: 03/26/2022/12:20:47 PM    Final    ? ?Mild midthoracic degenerative disease without spinal canal stenosis. ?There is a small central disc protrusion at T7-8 and a small left ?subarticular disc protrusion at T8-9 ?  ?Assessment/Plan: ?76 yr female with metastatic  lung cancer presenting with weakness and hypotension and found to have bacteremia ?  ?Streptococcus bacteremia- strep mitis- source usually GI tract/skin ?She has no pneumonia or GI source ?Has a port and concern for infection thru that ?2 d echo suboptimal ?Need TEE ?Also of concern is the mid thoracic fracture and pain- had MRI on 3/24 and it did not show any infection- because of persisting pain and new bacteremia repeat thoracic spine MRI with contrast to look for discitis/paraspinal abscess ?Continue penicillin IV infusion ? ?E.coli in urine but no urinary symptoms - so no Rx needed ?  ?Metastatic lung cancer with cerebral mets- received Chemo/radiation and looks like in remission ?  ?Anemia ?  ?Transaminitis ?  ?Recent PE ?  ?H/o  CVA- multiple cerebellar infarcts ?  ? ?Discussed the management with  care team ?ID will follow her peripherally this weekend- call if needed ?  ?

## 2022-03-27 NOTE — Evaluation (Signed)
Physical Therapy Evaluation ?Patient Details ?Name: Kristi [name] [facility] ?MRN: [id number] ?DOB: [birth year] ?Today's Date: 03/27/2022 ? ?History of Present Illness ? Patient is a 76 year old female with past medical history of MI, chronic T7 Fx, CAD, small cell lung cancer with right chest wall catheter implant status postchemotherapy 3 weeks prior who was just discharged from the hospitalist service on 3/29 for weakness and went to a skilled nursing facility.  She presented to the emergency room on 4/3 with complaints of weakness.  At that time, blood cultures were done.  Patient sent back to skilled nursing facility.  Those blood cultures came back positive for Streptococcus species in both bottles and patient sent back to the emergency room on the night of 4/4. MD assessment includes streptococcal bacteremia, hypotension, PE ound during prior hospitalization, and hypokalemia. ?  ?Clinical Impression ? Pt was pleasant and motivated to participate during the session and put forth good effort throughout. Pt required only min A with bed mobility and no physical assistance with transfers or ambulation.  Pt was able to amb 180 feet with a RW with no adverse symptoms and without overt LOB.  Pt did present with a mild drift to the left that the pt stated was chronic but improved with cuing.  Pt will benefit from HHPT upon discharge to safely address deficits listed in patient problem list for decreased caregiver assistance and eventual return to PLOF. ? ?   ?   ? ?Recommendations for follow up therapy are one component of a multi-disciplinary discharge planning process, led by the attending physician.  Recommendations may be updated based on patient status, additional functional criteria and insurance authorization. ? ?Follow Up Recommendations Home health PT ? ?  ?Assistance Recommended at Discharge Frequent or constant Supervision/Assistance  ?Patient can return home with the following ? Assistance with  cooking/housework;Direct supervision/assist for medications management;Assist for transportation;A little help with walking and/or transfers;A little help with bathing/dressing/bathroom ? ?  ?Equipment Recommendations Rolling walker (2 wheels)  ?Recommendations for Other Services ?    ?  ?Functional Status Assessment Patient has had a recent decline in their functional status and demonstrates the ability to make significant improvements in function in a reasonable and predictable amount of time.  ? ?  ?Precautions / Restrictions Precautions ?Precautions: Fall ?Restrictions ?Weight Bearing Restrictions: No ?Other Position/Activity Restrictions: RUQ port  ? ?  ? ?Mobility ? Bed Mobility ?Overal bed mobility: Needs Assistance ?Bed Mobility: Rolling, Sidelying to Sit ?Rolling: Supervision ?Sidelying to sit: Min assist ?  ?  ?  ?General bed mobility comments: Log roll training with min A for BLE and trunk control ?  ? ?Transfers ?Overall transfer level: Needs assistance ?Equipment used: Rolling walker (2 wheels) ?Transfers: Sit to/from Stand ?Sit to Stand: Supervision ?  ?  ?  ?  ?  ?General transfer comment: Good control and stability with min verbal cues for hand placement ?  ? ?Ambulation/Gait ?Ambulation/Gait assistance: Min guard ?Gait Distance (Feet): 180 Feet ?Assistive device: Rolling walker (2 wheels) ?Gait Pattern/deviations: Step-through pattern, Decreased step length - right, Decreased step length - left, Trunk flexed ?Gait velocity: decreased ?  ?  ?General Gait Details: Slow cadence with mild drifting to the L, chronic per patient, but improved with cuing; Pt steady during amb with no overt LOB ? ?Stairs ?  ?  ?  ?  ?  ? ?Wheelchair Mobility ?  ? ?Modified Rankin (Stroke Patients Only) ?  ? ?  ? ?Balance Overall balance assessment: Needs assistance ?  ?  Sitting balance-Leahy Scale: Good ?  ?  ?Standing balance support: Bilateral upper extremity supported, During functional activity ?Standing balance-Leahy  Scale: Fair ?  ?  ?  ?  ?  ?  ?  ?  ?  ?  ?  ?  ?   ? ? ? ?Pertinent Vitals/Pain Pain Assessment ?Pain Assessment: 0-10 ?Pain Score: 6  ?Pain Location: back pain (chronic) ?Pain Descriptors / Indicators: Sore  ? ? ?Home Living Family/patient expects to be discharged to:: Private residence ?Living Arrangements: Spouse/significant other ?Available Help at Discharge: Family;Available 24 hours/day ?Type of Home: Apartment ?Home Access: Level entry ?  ?  ?  ?Home Layout: One level ?Home Equipment: Rollator (4 wheels);Grab bars - toilet;Wheelchair - manual ?   ?  ?Prior Function Prior Level of Function : Independent/Modified Independent ?  ?  ?  ?  ?  ?  ?Mobility Comments: Mod Ind with amb with a rollator for household distances. Endorses 2 falls in the past 6 months. Pt & pt's husband reporting that she is able to stand for at least 15 minutes at a time before needing seated rest break ?ADLs Comments: MOD-I (requires increased time/effort) to perform ADLs at baseline. Husband assists with IADLs ?  ? ? ?Hand Dominance  ? Dominant Hand: Right ? ?  ?Extremity/Trunk Assessment  ? Upper Extremity Assessment ?Upper Extremity Assessment: Generalized weakness ?  ? ?Lower Extremity Assessment ?Lower Extremity Assessment: Generalized weakness ?  ? ?   ?Communication  ? Communication: No difficulties  ?Cognition Arousal/Alertness: Awake/alert ?Behavior During Therapy: [facility] for tasks assessed/performed ?Overall Cognitive Status: Within Functional Limits for tasks assessed ?  ?  ?  ?  ?  ?  ?  ?  ?  ?  ?  ?  ?  ?  ?  ?  ?  ?  ?  ? ?  ?General Comments   ? ?  ?Exercises Total Joint Exercises ?Heel Slides: AAROM, Strengthening, Both, 5 reps, AROM ?Long [employer]: Strengthening, Both, 10 reps ?Knee Flexion: Strengthening, Both, 10 reps ?Marching in Standing: AROM, Strengthening, Both, 5 reps, Standing ?Other Exercises ?Other Exercises: Log roll training provided  ? ?Assessment/Plan  ?  ?PT Assessment Patient needs continued PT services   ?PT Problem List Decreased strength;Decreased activity tolerance;Decreased balance;Decreased mobility;Decreased knowledge of use of DME;Decreased safety awareness ? ?   ?  ?PT Treatment Interventions DME instruction;Gait training;Functional mobility training;Therapeutic activities;Therapeutic exercise;Balance training;Patient/family education   ? ?PT Goals (Current goals can be found in the Care Plan section)  ?Acute Rehab PT Goals ?Patient Stated Goal: To get stronger ?PT Goal Formulation: With patient ?Time For Goal Achievement: 04/09/22 ?Potential to Achieve Goals: Good ? ?  ?Frequency Min 2X/week ?  ? ? ?Co-evaluation   ?  ?  ?  ?  ? ? ?  ?AM-PAC PT "6 Clicks" Mobility  ?Outcome Measure Help needed turning from your back to your side while in a flat bed without using bedrails?: A Little ?Help needed moving from lying on your back to sitting on the side of a flat bed without using bedrails?: A Little ?Help needed moving to and from a bed to a chair (including a wheelchair)?: A Little ?Help needed standing up from a chair using your arms (e.g., wheelchair or bedside chair)?: A Little ?Help needed to walk in hospital room?: A Little ?Help needed climbing 3-5 steps with a railing? : A Lot ?6 Click Score: 17 ? ?  ?End of Session Equipment Utilized During  Treatment: Gait belt ?Activity Tolerance: Patient tolerated treatment well ?Patient left: in chair;with call bell/phone within reach;with chair alarm set;with family/visitor present ?Nurse Communication: Mobility status ?PT Visit Diagnosis: Difficulty in walking, not elsewhere classified (R26.2);Muscle weakness (generalized) (M62.81);History of falling (Z91.81) ?  ? ?Time: 4136-4383 ?PT Time Calculation (min) (ACUTE ONLY): 27 min ? ? ?Charges:   PT Evaluation ?$PT Eval Moderate Complexity: 1 Mod ?PT Treatments ?$Gait Training: 8-22 mins ?  ?   ? ?D. Royetta Asal PT, DPT ?03/27/22, 4:28 PM ? ? ?

## 2022-03-28 DIAGNOSIS — C3490 Malignant neoplasm of unspecified part of unspecified bronchus or lung: Secondary | ICD-10-CM | POA: Diagnosis not present

## 2022-03-28 DIAGNOSIS — S22069D Unspecified fracture of T7-T8 vertebra, subsequent encounter for fracture with routine healing: Secondary | ICD-10-CM | POA: Diagnosis not present

## 2022-03-28 DIAGNOSIS — R7881 Bacteremia: Secondary | ICD-10-CM | POA: Diagnosis not present

## 2022-03-28 DIAGNOSIS — I5189 Other ill-defined heart diseases: Secondary | ICD-10-CM | POA: Diagnosis not present

## 2022-03-28 LAB — BASIC METABOLIC PANEL
Anion gap: 7 (ref 5–15)
BUN: 8 mg/dL (ref 8–23)
CO2: 21 mmol/L — ABNORMAL LOW (ref 22–32)
Calcium: 8.9 mg/dL (ref 8.9–10.3)
Chloride: 110 mmol/L (ref 98–111)
Creatinine, Ser: 0.96 mg/dL (ref 0.44–1.00)
GFR, Estimated: >60 mL/min (ref >60)
Glucose, Bld: 104 mg/dL — ABNORMAL HIGH (ref 70–99)
Potassium: 3.6 mmol/L (ref 3.5–5.1)
Sodium: 138 mmol/L (ref 135–145)

## 2022-03-28 NOTE — Progress Notes (Signed)
SUBJECTIVE: Patient is comfortable ? ? ?Vitals:  ? 03/27/22 2039 03/28/22 0005 03/28/22 0402 03/28/22 0756  ?BP: 90/66 106/66 99/70 (!) 98/53  ?Pulse: 70 65 73 75  ?Resp: 20 16 16 16   ?Temp: 97.7 ?F (36.5 ?C) 98 ?F (36.7 ?C) 98 ?F (36.7 ?C) 98.6 ?F (37 ?C)  ?TempSrc:      ?SpO2: 96% 91% 94% 97%  ?Weight:      ?Height:      ? ? ?Intake/Output Summary (Last 24 hours) at 03/28/2022 0847 ?Last data filed at 03/28/2022 0230 ?Gross per 24 hour  ?Intake 1078.69 ml  ?Output 1250 ml  ?Net -171.31 ml  ? ? ?LABS: ?Basic Metabolic Panel: ?Recent Labs  ?  03/26/22 ?0718 03/28/22 ?0449  ?NA 140 138  ?K 3.3* 3.6  ?CL 111 110  ?CO2 21* 21*  ?GLUCOSE 91 104*  ?BUN 7* 8  ?CREATININE 0.86 0.96  ?CALCIUM 8.7* 8.9  ? ?Liver Function Tests: ?No results for input(s): AST, ALT, ALKPHOS, BILITOT, PROT, ALBUMIN in the last 72 hours. ?No results for input(s): LIPASE, AMYLASE in the last 72 hours. ?CBC: ?Recent Labs  ?  03/26/22 ?0718  ?WBC 5.3  ?HGB 9.7*  ?HCT 29.8*  ?MCV 87.9  ?PLT 250  ? ?Cardiac Enzymes: ?No results for input(s): CKTOTAL, CKMB, CKMBINDEX, TROPONINI in the last 72 hours. ?BNP: ?Invalid input(s): POCBNP ?D-Dimer: ?No results for input(s): DDIMER in the last 72 hours. ?Hemoglobin A1C: ?No results for input(s): HGBA1C in the last 72 hours. ?Fasting Lipid Panel: ?No results for input(s): CHOL, HDL, LDLCALC, TRIG, CHOLHDL, LDLDIRECT in the last 72 hours. ?Thyroid Function Tests: ?No results for input(s): TSH, T4TOTAL, T3FREE, THYROIDAB in the last 72 hours. ? ?Invalid input(s): FREET3 ?Anemia Panel: ?No results for input(s): VITAMINB12, FOLATE, FERRITIN, TIBC, IRON, RETICCTPCT in the last 72 hours. ? ? ?PHYSICAL EXAM ?General: Well developed, well nourished, in no acute distress ?HEENT:  Normocephalic and atramatic ?Neck:  No JVD.  ?Lungs: Clear bilaterally to auscultation and percussion. ?Heart: HRRR . Normal S1 and S2 without gallops or murmurs.  ?Abdomen: Bowel sounds are positive, abdomen soft and non-tender  ?Msk:  Back normal,  normal gait. Normal strength and tone for age. ?Extremities: No clubbing, cyanosis or edema.   ?Neuro: Alert and oriented X 3. ?Psych:  Good affect, responds appropriately ? ?TELEMETRY: Sinus rhythm ? ?ASSESSMENT AND PLAN: Strep sepsis with a history of small cell lung cancer.  Patient is all set up to have transesophageal echocardiogram Monday. ? ?Principal Problem: ?  Streptococcal bacteremia ?Active Problems: ?  Grade I diastolic dysfunction ?  Small cell lung cancer ([place]) ?  GERD without esophagitis ?  Acute pulmonary embolism ([place]) ?  Closed T7 fracture ([place]) ?  Hypotension ?  Hypokalemia ?  Dyslipidemia ?  ? ?Kristi David, MD, FACC ?03/28/2022 ?8:47 AM ? ? ? ?  ?

## 2022-03-28 NOTE — TOC Progression Note (Signed)
Transition of Care (TOC) - Progression Note  ? ? ?Patient Details  ?Name: Kristi [name] ?MRN: [id number] ?Date of Birth: 08-11-1946 ? ?Transition of Care (TOC) CM/SW Contact  ?Mikal Wisman A Laurin Morgenstern, LCSW ?Phone Number: ?03/28/2022, 2:25 PM ? ?Clinical Narrative:   Advanced will service at d/c. ? ? ? ?Expected Discharge Plan: [place] ?Barriers to Discharge: Continued Medical Work up ? ?Expected Discharge Plan and Services ?Expected Discharge Plan: [place] ?  ?  ?  ?Living arrangements for the past 2 months: [place], [place] ?                ?  ?  ?  ?  ?  ?  ?  ?  ?  ?  ? ? ?Social Determinants of Health (SDOH) Interventions ?  ? ?Readmission Risk Interventions ? ?  03/27/2022  ?  2:30 PM  ?Readmission Risk Prevention Plan  ?Transportation Screening Complete  ?PCP or Specialist Appt within 3-5 Days Complete  ?Social Work Consult for [place] Planning/Counseling Complete  ?Palliative Care Screening Not Applicable  ?Medication Review Press photographer) Complete  ? ? ?

## 2022-03-28 NOTE — Progress Notes (Signed)
[facility] Progress Note ? ?Patient: Kristi [name] [facility]    ZTI:[id number]  DOA: 03/24/2022    ?Date of Service: the patient was seen and examined on 03/28/2022 ? 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  She presented to the emergency room on 4/3 with complaints of weakness.  At that time, blood cultures were done.  Patient sent back to skilled nursing facility.  Those blood cultures came back positive for Streptococcus species in both bottles and patient sent back to the emergency room on the night of 4/4.  Repeat blood cultures were done.  Patient noted to have soft blood pressures with systolic in the 82N to 05L.  Patient given IV fluids and IV antibiotics. ? ?Assessment and Plan: ?Assessment and Plan: ?* Streptococcal bacteremia ?Admitted to the hospitalist service.  Started on IV Rocephin.  Infectious disease consulted.  Continue IV fluids.  Lactic acid level normalized.  Unclear etiology.  No evidence of GI or pneumonia infection.  2D echo unremarkable.  Needs TEE-cardiology consulted. ? ?Hypotension ?An ongoing issue.  Does not appear to be sepsis.  Previously been on midodrine, so have restarted. ? ?Dyslipidemia ?- We will continue statin therapy ? ?Acute pulmonary embolism ([place]) ?Found during last hospitalization.  Started on Xarelto which we will continue. ? ?Hypokalemia ?Replacing as needed. ? ?GERD without esophagitis ?- We will continue Carafate and PPI therapy. ? ?Small cell lung cancer ([place]) ?No chemo at this time until patient improves. ? ?Grade I diastolic dysfunction ?Euvolemic.  BNP within normal limits. ? 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  Pain improved with lidocaine patch. ? ? ? ? ? ? ?Body mass index is 24.14 kg/m?.  ?  ?    ? ?Consultants: ?Infectious disease ? ?Procedures: ?Echocardiogram unremarkable.  Grade 1 diastolic dysfunction noted. ?TEE 4/10 ?Antimicrobials: ?IV vancomycin 4/4 x 1 dose ?IV Rocephin 4/4-present ? ?Code Status: DNR ? ? ?Subjective: Patient okay, no complaints. ? ?Objective: ?Soft blood pressures noted ?Vitals:  ? 03/28/22 1128 03/28/22 1452  ?BP: 95/68 90/64  ?Pulse: 79 69  ?Resp: 18 16  ?Temp: 98 ?F (36.7 ?C) 97.7 ?F (36.5 ?C)  ?SpO2: 98% 98%  ? ? ?Intake/Output Summary (Last 24 hours) at 03/28/2022 1551 ?Last data filed at 03/28/2022 1100 ?Gross per 24 hour  ?Intake 598.69 ml  ?Output 450 ml  ?Net 148.69 ml  ? ? ?Filed Weights  ? 03/27/22 0701  ?Weight: 65.8 kg  ? ?Body mass index is 24.14 kg/m?. ? ?Exam: ? ?General: Alert and oriented x3, no acute distress ?HEENT: Normocephalic atraumatic, mucous membranes are moist ?Cardiovascular: Regular rate and rhythm, S1-S2 ?Respiratory: Clear to auscultation bilaterally ?Abdomen: Soft, nontender, nondistended, positive bowel sounds ?Musculoskeletal: No clubbing or cyanosis or edema ?Skin: No skin breaks, tears or lesions ?Psychiatry: Appropriate, no evidence of psychoses ?Neurology: Lower extremity weakness, present from previous hospitalization. ? ?Data Reviewed: ?No labs today. ? ?Disposition:  ?Status is: Inpatient ?Remains inpatient appropriate because: Treatment of bacteremia ?  ? ?Anticipated discharge date: 4/11 ? ?Remaining issues to be resolved so that patient can be discharged: Determination of length of antibiotics.  TEE.  Patient will go home ? ? ?Family Communication: Husband at the bedside ?DVT Prophylaxis: ? ?Rivaroxaban (XARELTO) tablet 15 mg  ?rivaroxaban (XARELTO) tablet 20 mg  ? ? ?  Author: ?Annita Brod ,MD ?03/28/2022 3:51 PM ? ?To reach On-call, see care teams to locate the attending and reach out via [email or web address]. ?Between 7PM-7AM, please contact night-coverage ?If you still have difficulty reaching the attending provider, please page the [facility]  (Director on Call) for [facility] on amion for assistance. ? ?

## 2022-03-28 NOTE — Evaluation (Signed)
Occupational Therapy Evaluation Patient Details Name: Kristi [name] MRN: [id number] DOB: [birth year] Today's Date: 03/28/2022   History of Present Illness Patient is a 76 year old female with past medical history of MI, chronic T7 Fx, CAD, small cell lung cancer with right chest wall catheter implant status postchemotherapy 3 weeks prior who was just discharged from the hospitalist service on 3/29 for weakness and went to a skilled nursing facility.  She presented to the emergency room on 4/3 with complaints of weakness.  At that time, blood cultures were done.  Patient sent back to skilled nursing facility.  Those blood cultures came back positive for Streptococcus species in both bottles and patient sent back to the emergency room on the night of 4/4. MD assessment includes streptococcal bacteremia, hypotension, PE found during prior hospitalization, and hypokalemia.   Clinical Impression   Pt seen this date for OT evaluation.  Pt lives with her husband in an apt and has all necessary adaptive equipment.  Pt was previously at a SNF for STR but does not want to return to a facility.  Husband reports he feels he can care for pt at home with home health services.  Pt presents with muscle weakness, decreased transfers, mobility and decreased ability to perform self care tasks.  She would benefit from skilled OT services to maximize her safety and independence in necessary daily tasks and to reduce fall risk and caregiver burden.  Recommend HHOT services upon discharge from the hospital.      Recommendations for follow up therapy are one component of a multi-disciplinary discharge planning process, led by the attending physician.  Recommendations may be updated based on patient status, additional functional criteria and insurance authorization.   Follow Up Recommendations  Home health OT    Assistance Recommended at Discharge Frequent or constant Supervision/Assistance  Patient can return home with  the following A lot of help with bathing/dressing/bathroom;A little help with walking and/or transfers;Assistance with cooking/housework;Direct supervision/assist for financial management;Assist for transportation;Direct supervision/assist for medications management    Functional Status Assessment  Patient has had a recent decline in their functional status and demonstrates the ability to make significant improvements in function in a reasonable and predictable amount of time.  Equipment Recommendations       Recommendations for Other Services       Precautions / Restrictions Precautions Precautions: Fall Restrictions Weight Bearing Restrictions: No Other Position/Activity Restrictions: RUQ port      Mobility Bed Mobility Overal bed mobility: Needs Assistance Bed Mobility: Rolling, Sidelying to Sit Rolling: Supervision Sidelying to sit: Min assist Supine to sit: Min assist          Transfers Overall transfer level: Needs assistance Equipment used: Rolling walker (2 wheels) Transfers: Sit to/from Stand Sit to Stand: Min assist           General transfer comment: Pt had just awoken and required increased assist with transfers this date.      Balance Overall balance assessment: Needs assistance Sitting-balance support: No upper extremity supported, Feet supported Sitting balance-Leahy Scale: Good     Standing balance support: Bilateral upper extremity supported, During functional activity Standing balance-Leahy Scale: Fair Standing balance comment: Min A for stability during ambulation                           ADL either performed or assessed with clinical judgement   ADL Overall ADL's : Needs assistance/impaired Eating/Feeding: Set up Eating/Feeding Details (indicate cue  type and reason): assist to set up tray and prepare coffee with cream, cut food Grooming: Wash/dry hands;Set up;Sitting Grooming Details (indicate cue type and reason): Requires  MIN GUARD d/t decreased activity tolerance and decreased standing balance. Upper Body Bathing: Minimal assistance   Lower Body Bathing: Minimal assistance   Upper Body Dressing : Minimal assistance   Lower Body Dressing: Minimal assistance   Toilet Transfer: Minimal assistance;Ambulation;BSC/3in1;Rolling walker (2 wheels) Toilet Transfer Details (indicate cue type and reason): Requires frequent verbal cues for safe hand placement with RW use Toileting- Clothing Manipulation and Hygiene: Moderate assistance Toileting - Clothing Manipulation Details (indicate cue type and reason): mod assist peri care     Functional mobility during ADLs: Minimal assistance;Rolling walker (2 wheels) General ADL Comments: Pt requires cues for safety with transfer to [facility], tends to sit more on lateral edge of BSC and needed to move towards the center.  Tends to lean forward with all tasks and requires cues for posture.  Transfer to chair with min to min guard assist with use of RW.  Pt was incontinent on arrival, therapist changed bed linens, assist with toilet and assisted with bathing.     Vision Baseline Vision/History: 1 Wears glasses Ability to See in Adequate Light: 0 Adequate Patient Visual Report: No change from baseline       Perception     Praxis      Pertinent Vitals/Pain Pain Assessment Pain Assessment: No/denies pain Pain Score: 6  Pain Location: back pain (chronic) Pain Descriptors / Indicators: Sore Pain Intervention(s): Limited activity within patient's tolerance, Monitored during session, Repositioned     Hand Dominance Right   Extremity/Trunk Assessment Upper Extremity Assessment Upper Extremity Assessment: Generalized weakness   Lower Extremity Assessment Lower Extremity Assessment: Generalized weakness       Communication Communication Communication: No difficulties   Cognition Arousal/Alertness: Awake/alert Behavior During Therapy: WFL for tasks assessed/performed                                    General Comments: Pt alert and oriented to self, place, and parts of situation. Pt presents with decreased short term memory, husband present during 1/2 of evaluation.     General Comments       Exercises     Shoulder Instructions      Home Living Family/patient expects to be discharged to:: Private residence Living Arrangements: Spouse/significant other Available Help at Discharge: Family;Available 24 hours/day Type of Home: Apartment Home Access: Level entry     Home Layout: One level     Bathroom Shower/Tub: Sponge bathes at baseline   Bathroom Toilet: Handicapped height     Home Equipment: Rollator (4 wheels);Grab bars - toilet;Wheelchair - manual;Grab bars - tub/shower;Shower seat;Toilet riser      Lives With: Spouse    Prior Functioning/Environment Prior Level of Function : Independent/Modified Independent             Mobility Comments: Mod Ind with amb with a rollator for household distances. Endorses 2 falls in the past 6 months. Pt & pt's husband reporting that she is able to stand for at least 15 minutes at a time before needing seated rest break due to weakness/fatigue. ADLs Comments: Modified independent prior with basic self care tasks.  Husband performs all home management, meal prep and med management tasks, all IADLs.        OT Problem List: Decreased strength;Decreased activity tolerance;Impaired  balance (sitting and/or standing);Decreased cognition;Decreased knowledge of use of DME or AE;Decreased knowledge of precautions;Pain      OT Treatment/Interventions: Self-care/ADL training;Therapeutic exercise;Energy conservation;DME and/or AE instruction;Therapeutic activities;Patient/family education;Balance training    OT Goals(Current goals can be found in the care plan section) Acute Rehab OT Goals Patient Stated Goal: to go home and be able to do as much as I can OT Goal Formulation: With  patient/family Time For Goal Achievement: 04/11/22 Potential to Achieve Goals: Good ADL Goals Pt Will Perform Upper Body Bathing: with set-up Pt Will Perform Lower Body Bathing: with set-up;with supervision Pt Will Transfer to Toilet: with min guard assist  OT Frequency: Min 2X/week    Co-evaluation              AM-PAC OT "6 Clicks" Daily Activity     Outcome Measure Help from another person eating meals?: None Help from another person taking care of personal grooming?: A Little Help from another person toileting, which includes using toliet, bedpan, or urinal?: A Lot Help from another person bathing (including washing, rinsing, drying)?: A Lot Help from another person to put on and taking off regular upper body clothing?: A Little Help from another person to put on and taking off regular lower body clothing?: A Little 6 Click Score: 17   End of Session Equipment Utilized During Treatment: Rolling walker (2 wheels);Gait belt Nurse Communication: Mobility status  Activity Tolerance: Patient tolerated treatment well Patient left: in chair;with call bell/phone within reach;with family/visitor present  OT Visit Diagnosis: Unsteadiness on feet (R26.81);History of falling (Z91.81);Muscle weakness (generalized) (M62.81);Pain Pain - Right/Left: Right Pain - part of body:  (back)                Time: 4098-1191 OT Time Calculation (min): 38 min Charges:  OT General Charges $OT Visit: 1 Visit OT Evaluation $OT Eval Low Complexity: 1 Low OT Treatments $Self Care/Home Management : 23-37 mins Asma Boldon T Rand [place], OTR/L, CLT  03/28/2022, 10:38 AM

## 2022-03-29 ENCOUNTER — Inpatient Hospital Stay: Payer: Medicare Other

## 2022-03-29 DIAGNOSIS — I5189 Other ill-defined heart diseases: Secondary | ICD-10-CM | POA: Diagnosis not present

## 2022-03-29 DIAGNOSIS — R7881 Bacteremia: Secondary | ICD-10-CM | POA: Diagnosis not present

## 2022-03-29 DIAGNOSIS — S22069D Unspecified fracture of T7-T8 vertebra, subsequent encounter for fracture with routine healing: Secondary | ICD-10-CM | POA: Diagnosis not present

## 2022-03-29 DIAGNOSIS — C3490 Malignant neoplasm of unspecified part of unspecified bronchus or lung: Secondary | ICD-10-CM | POA: Diagnosis not present

## 2022-03-29 LAB — CULTURE, BLOOD (ROUTINE X 2)
Culture: NO GROWTH
Culture: NO GROWTH
Culture: NO GROWTH
Special Requests: ADEQUATE

## 2022-03-29 LAB — BRAIN NATRIURETIC PEPTIDE: B Natriuretic Peptide: 35.4 pg/mL (ref 0.0–100.0)

## 2022-03-29 MED ORDER — LIDOCAINE 5 % EX PTCH
2.0000 | MEDICATED_PATCH | CUTANEOUS | Status: DC
  Administered 2022-03-29 – 2022-03-30 (×2): 2 via TRANSDERMAL
  Filled 2022-03-29 (×2): qty 2

## 2022-03-29 NOTE — Progress Notes (Signed)
SUBJECTIVE: Denies any complaints. ? ? ?Vitals:  ? 03/28/22 1954 03/29/22 0020 03/29/22 0350 03/29/22 1062  ?BP: 97/60 126/69 117/68 97/69  ?Pulse: 78 78 79 89  ?Resp: 18 18 16 16   ?Temp: (!) 97.5 ?F (36.4 ?C) 97.9 ?F (36.6 ?C) 98 ?F (36.7 ?C) 97.9 ?F (36.6 ?C)  ?TempSrc:   Oral   ?SpO2: 98% 96% 99% 96%  ?Weight:      ?Height:      ? ? ?Intake/Output Summary (Last 24 hours) at 03/29/2022 1027 ?Last data filed at 03/29/2022 6948 ?Gross per 24 hour  ?Intake 1634.17 ml  ?Output 850 ml  ?Net 784.17 ml  ? ? ?LABS: ?Basic Metabolic Panel: ?Recent Labs  ?  03/28/22 ?0449  ?NA 138  ?K 3.6  ?CL 110  ?CO2 21*  ?GLUCOSE 104*  ?BUN 8  ?CREATININE 0.96  ?CALCIUM 8.9  ? ?Liver Function Tests: ?No results for input(s): AST, ALT, ALKPHOS, BILITOT, PROT, ALBUMIN in the last 72 hours. ?No results for input(s): LIPASE, AMYLASE in the last 72 hours. ?CBC: ?No results for input(s): WBC, NEUTROABS, HGB, HCT, MCV, PLT in the last 72 hours. ?Cardiac Enzymes: ?No results for input(s): CKTOTAL, CKMB, CKMBINDEX, TROPONINI in the last 72 hours. ?BNP: ?Invalid input(s): POCBNP ?D-Dimer: ?No results for input(s): DDIMER in the last 72 hours. ?Hemoglobin A1C: ?No results for input(s): HGBA1C in the last 72 hours. ?Fasting Lipid Panel: ?No results for input(s): CHOL, HDL, LDLCALC, TRIG, CHOLHDL, LDLDIRECT in the last 72 hours. ?Thyroid Function Tests: ?No results for input(s): TSH, T4TOTAL, T3FREE, THYROIDAB in the last 72 hours. ? ?Invalid input(s): FREET3 ?Anemia Panel: ?No results for input(s): VITAMINB12, FOLATE, FERRITIN, TIBC, IRON, RETICCTPCT in the last 72 hours. ? ? ?PHYSICAL EXAM ?General: Well developed, well nourished, in no acute distress ?HEENT:  Normocephalic and atramatic ?Neck:  No JVD.  ?Lungs: Clear bilaterally to auscultation and percussion. ?Heart: HRRR . Normal S1 and S2 without gallops or murmurs.  ?Abdomen: Bowel sounds are positive, abdomen soft and non-tender  ?Msk:  Back normal, normal gait. Normal strength and tone for  age. ?Extremities: No clubbing, cyanosis or edema.   ?Neuro: Alert and oriented X 3. ?Psych:  Good affect, responds appropriately ? ?TELEMETRY: Sinus rhythm ? ?ASSESSMENT AND PLAN: Streptococcal bacteremia with history of lung cancer and rule out endocarditis.  Patient is set up for transesophageal echocardiogram in the a.m. ? ?Principal Problem: ?  Streptococcal bacteremia ?Active Problems: ?  Grade I diastolic dysfunction ?  Small cell lung cancer ([place]) ?  GERD without esophagitis ?  Acute pulmonary embolism ([place]) ?  Closed T7 fracture ([place]) ?  Hypotension ?  Hypokalemia ?  Dyslipidemia ?  ? ?Dionisio David, MD, FACC ?03/29/2022 ?10:27 AM ? ? ? ?  ?

## 2022-03-29 NOTE — Progress Notes (Signed)
[facility] Progress Note ? ?Patient: Kristi [name] [name]    HTD:[id number]  DOA: 03/24/2022    ?Date of Service: the patient was seen and examined on 03/29/2022 ? 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  She presented to the emergency room on 4/3 with complaints of weakness.  At that time, blood cultures were done.  Patient sent back to skilled nursing facility.  Those blood cultures came back positive for Streptococcus species in both bottles and patient sent back to the emergency room on the night of 4/4.  Repeat blood cultures were done.  Patient noted to have soft blood pressures with systolic in the 72I to 20B.  Patient given IV fluids and IV antibiotics. ? ?Assessment and Plan: ?Assessment and Plan: ?* Streptococcal bacteremia ?Admitted to the hospitalist service.  Started on IV Rocephin.  Infectious disease consulted.  Continue IV fluids.  Lactic acid level normalized.  Unclear etiology.  No evidence of GI or pneumonia infection.  2D echo unremarkable.  Needs TEE-cardiology consulted. ? ?Hypotension ?An ongoing issue.  Does not appear to be sepsis.  Previously been on midodrine, so have restarted. ? ?Dyslipidemia ?- We will continue statin therapy ? ?Acute pulmonary embolism ([name]) ?Found during last hospitalization.  Started on Xarelto which we will continue. ? ?Hypokalemia ?Replacing as needed. ? ?GERD without esophagitis ?- We will continue Carafate and PPI therapy. ? ?Small cell lung cancer ([name]) ?No chemo at this time until patient improves. ? ?Grade I diastolic dysfunction ?Euvolemic.  BNP within normal limits.  Patient complaining of some increased dyspnea on exertion, but BNP on 4/9 still normal.  Chest x-ray unremarkable. ? ?Closed T7  fracture ([name]) ?Chronic issue.  Pain initially improved with lidocaine patch.  We will add second patch ? ? ? ? ? ? ?Body mass index is 24.14 kg/m?.  ?  ?   ? ?Consultants: ?Infectious disease ? ?Procedures: ?Echocardiogram unremarkable.  Grade 1 diastolic dysfunction noted. ?TEE 4/10 ?Antimicrobials: ?IV vancomycin 4/4 x 1 dose ?IV Rocephin 4/4-present ? ?Code Status: DNR ? ? ?Subjective: Patient complains some mild increase in her back pain.  Also complains of increased dyspnea on exertion ? ?Objective: ?Soft blood pressures noted ?Vitals:  ? 03/29/22 0807 03/29/22 1211  ?BP: 97/69 107/68  ?Pulse: 89 78  ?Resp: 16 17  ?Temp: 97.9 ?F (36.6 ?C) 97.6 ?F (36.4 ?C)  ?SpO2: 96% 96%  ? ? ?Intake/Output Summary (Last 24 hours) at 03/29/2022 1355 ?Last data filed at 03/29/2022 1121 ?Gross per 24 hour  ?Intake 1714.98 ml  ?Output 850 ml  ?Net 864.98 ml  ? ?Filed Weights  ? 03/27/22 0701  ?Weight: 65.8 kg  ? ?Body mass index is 24.14 kg/m?. ? ?Exam: ? ?General: Alert and oriented x3, no acute distress ?HEENT: Normocephalic atraumatic, mucous membranes are moist ?Cardiovascular: Regular rate and rhythm, S1-S2 ?Respiratory: Clear to auscultation bilaterally ?Abdomen: Soft, nontender, nondistended, positive bowel sounds ?Musculoskeletal: No clubbing or cyanosis or edema ?Skin: No skin breaks, tears or lesions ?Psychiatry: Appropriate, no evidence of psychoses ?Neurology: Lower extremity weakness, present from previous hospitalization. ? ?Data Reviewed: ?No labs today. ? ?Disposition:  ?Status is: Inpatient ?Remains inpatient appropriate because: Treatment of bacteremia ?  ? ?Anticipated discharge date: 4/11 ? ?Remaining issues to be resolved so that  patient can be discharged: Determination of length of antibiotics.  TEE.  Patient will go home instead of returning to skilled nursing ? ? ?Family Communication: Husband at the bedside ?DVT Prophylaxis: ? ?Rivaroxaban (XARELTO) tablet 15 mg  ?rivaroxaban (XARELTO) tablet 20 mg   ? ? ?Author: ?Annita Brod ,MD ?03/29/2022 1:55 PM ? ?To reach On-call, see care teams to locate the attending and reach out via [email or web address]. ?Between 7PM-7AM, please contact night-coverage ?If you still have difficulty reaching the attending provider, please page the [facility] (Director on Call) for [facility] on amion for assistance. ? ?

## 2022-03-30 ENCOUNTER — Inpatient Hospital Stay: Payer: Medicare Other

## 2022-03-30 ENCOUNTER — Inpatient Hospital Stay: Payer: Medicare Other | Admitting: Oncology

## 2022-03-30 ENCOUNTER — Inpatient Hospital Stay
Admit: 2022-03-30 | Discharge: 2022-03-30 | Disposition: A | Discharge status: Home / Self Care | Payer: Medicare Other | Attending: Cardiovascular Disease | Admitting: Cardiovascular Disease

## 2022-03-30 ENCOUNTER — Encounter
Admission: EM | Disposition: A | Discharge status: Home / Self Care | Payer: Self-pay | Source: Home / Self Care | Attending: Internal Medicine

## 2022-03-30 DIAGNOSIS — R7881 Bacteremia: Secondary | ICD-10-CM | POA: Diagnosis not present

## 2022-03-30 DIAGNOSIS — I5189 Other ill-defined heart diseases: Secondary | ICD-10-CM | POA: Diagnosis not present

## 2022-03-30 DIAGNOSIS — K5900 Constipation, unspecified: Secondary | ICD-10-CM | POA: Diagnosis not present

## 2022-03-30 DIAGNOSIS — I2699 Other pulmonary embolism without acute cor pulmonale: Secondary | ICD-10-CM | POA: Diagnosis not present

## 2022-03-30 HISTORY — PX: TEE WITHOUT CARDIOVERSION: SHX5443

## 2022-03-30 SURGERY — ECHOCARDIOGRAM, TRANSESOPHAGEAL
Anesthesia: Moderate Sedation | Laterality: Right

## 2022-03-30 MED ORDER — BUTAMBEN-TETRACAINE-BENZOCAINE 2-2-14 % EX AERO
INHALATION_SPRAY | CUTANEOUS | Status: AC
  Filled 2022-03-30: qty 5

## 2022-03-30 MED ORDER — SODIUM CHLORIDE 0.9 % IV SOLN
2.0000 g | Freq: Once | INTRAVENOUS | Status: AC
  Administered 2022-03-31: 2 g via INTRAVENOUS
  Filled 2022-03-30: qty 20

## 2022-03-30 MED ORDER — FENTANYL CITRATE (PF) 100 MCG/2ML IJ SOLN
INTRAMUSCULAR | Status: AC
  Filled 2022-03-30: qty 2

## 2022-03-30 MED ORDER — SODIUM CHLORIDE FLUSH 0.9 % IV SOLN
INTRAVENOUS | Status: AC
  Filled 2022-03-30: qty 10

## 2022-03-30 MED ORDER — MIDAZOLAM HCL 2 MG/2ML IJ SOLN
INTRAMUSCULAR | Status: AC
  Filled 2022-03-30: qty 2

## 2022-03-30 MED ORDER — MIDAZOLAM HCL 2 MG/2ML IJ SOLN
INTRAMUSCULAR | Status: AC | PRN
  Administered 2022-03-30: 1 mg via INTRAVENOUS

## 2022-03-30 MED ORDER — LIDOCAINE VISCOUS HCL 2 % MT SOLN
OROMUCOSAL | Status: AC
  Filled 2022-03-30: qty 15

## 2022-03-30 MED ORDER — SODIUM CHLORIDE 0.9 % IV SOLN: INTRAVENOUS | Status: DC

## 2022-03-30 MED ORDER — AMOXICILLIN 500 MG PO CAPS: 500.0000 mg | ORAL_CAPSULE | Freq: Three times a day (TID) | ORAL | Status: DC

## 2022-03-30 MED ORDER — LIDOCAINE 5 % EX PTCH
1.0000 | MEDICATED_PATCH | CUTANEOUS | Status: DC
  Administered 2022-03-31: 1 via TRANSDERMAL
  Filled 2022-03-30: qty 1

## 2022-03-30 MED ORDER — LACTULOSE 10 GM/15ML PO SOLN
10.0000 g | Freq: Every day | ORAL | Status: DC | PRN
  Administered 2022-03-30: 10 g via ORAL
  Filled 2022-03-30: qty 30

## 2022-03-30 MED ORDER — FENTANYL CITRATE (PF) 100 MCG/2ML IJ SOLN
INTRAMUSCULAR | Status: AC | PRN
  Administered 2022-03-30: 50 ug via INTRAVENOUS

## 2022-03-30 NOTE — Progress Notes (Addendum)
? ?Date of Admission:  03/24/2022    ? ?ID: [name] is a 76 y.o. female Principal Problem: ?  Streptococcal bacteremia ?Active Problems: ?  Grade I diastolic dysfunction ?  Small cell lung cancer ([place]) ?  GERD without esophagitis ?  Acute pulmonary embolism ([place]) ?  Closed T7 fracture ([place]) ?  Hypotension ?  Hypokalemia ?  Dyslipidemia ? ? ? ?Subjective: ?Doing much better ?Had TEE this morning ?No specific complaints ?Medications:  ? atorvastatin  40 mg Oral Daily  ? butamben-tetracaine-benzocaine      ? fentaNYL      ? lidocaine  2 patch Transdermal Q24H  ? lidocaine      ? midazolam      ? midodrine  5 mg Oral TID WC  ? rivaroxaban  15 mg Oral BID  ? Followed by  ? [START ON 04/05/2022] rivaroxaban  20 mg Oral Q supper  ? sertraline  100 mg Oral Daily  ? sodium chloride flush      ? ? ?Objective: ?Vital signs in last 24 hours: ?Temp:  [97.6 ?F (36.4 ?C)-98.7 ?F (37.1 ?C)] 97.6 ?F (36.4 ?C) (04/10 1145) ?Pulse Rate:  [71-90] 90 (04/10 1145) ?Resp:  [15-20] 15 (04/10 0930) ?BP: (92-135)/(63-81) 114/70 (04/10 1145) ?SpO2:  [93 %-97 %] 96 % (04/10 1145) ?Awake and alert ?No distress ?Chest b/l air entry ?Haas2s2 ?And soft ?Lab Results ?Recent Labs  ?  03/28/22 ?0449  ?NA 138  ?K 3.6  ?CL 110  ?CO2 21*  ?BUN 8  ?CREATININE 0.96  ? ?Liver Panel ?No results for input(s): PROT, ALBUMIN, AST, ALT, ALKPHOS, BILITOT, BILIDIR, IBILI in the last 72 hours. ? ? ?Microbiology: ?BC- strep oralis ?Studies/Results: ?DG Chest Port 1 View ? ?Result Date: 03/29/2022 ?CLINICAL DATA:  Dyspnea EXAM: PORTABLE CHEST 1 VIEW COMPARISON:  03/24/2022 FINDINGS: Rotated AP portable examination. Right chest port catheter. The heart size and mediastinal contours are within normal limits. Both lungs are clear. The visualized skeletal structures are unremarkable. IMPRESSION: Rotated AP portable examination without acute abnormality of the lungs. Electronically Signed   By: Delanna Ahmadi M.D.   On: 03/29/2022 11:29  ? ?ECHO TEE ? ?Result Date:  03/30/2022 ?   TRANSESOPHOGEAL ECHO REPORT   Patient Name:   Kristi [name] Date of Exam: 03/30/2022 Medical Rec #:  [id number]        Height:       65.0 in Accession #:    [id number]       Weight:       145.1 lb Date of Birth:  January 03, 1946       BSA:          1.726 m? Patient Age:    76 years         BP:           106/65 mmHg Patient Gender: F                HR:           80 bpm. Exam Location:  [facility] Procedure: Transesophageal Echo, Color Doppler and Cardiac Doppler Indications:     Not listed on TEE check-in sheet.  History:         Patient has prior history of Echocardiogram examinations, most                  recent 03/26/2022. Previous Myocardial Infarction and Acute MI.  CAD.  Sonographer:     Sherrie Sport Referring Phys:  [place] Diagnosing Phys: [place]: The transesophogeal probe was passed without difficulty through the esophogus of the patient. Sedation performed by performing physician. Patients was under conscious sedation during this procedure. Anesthetic administered: 29mcg of Fentanyl, 1.0mg  of Versed. The patient's vital signs; including heart rate, blood pressure, and oxygen saturation; remained stable throughout the procedure. The patient developed no complications during the procedure. IMPRESSIONS  1. Left ventricular ejection fraction, by estimation, is 60 to 65%. The left ventricle has normal function. The left ventricle has no regional wall motion abnormalities.  2. Right ventricular systolic function is normal. The right ventricular size is normal.  3. Left atrial size was mildly dilated. No left atrial/left atrial appendage thrombus was detected.  4. Right atrial size was mildly dilated.  5. The mitral valve is normal in structure. No evidence of mitral valve regurgitation. No evidence of mitral stenosis.  6. The aortic valve is normal in structure. Aortic valve regurgitation is not visualized. No aortic stenosis is present.  7. The inferior vena cava is  normal in size with greater than 50% respiratory variability, suggesting right atrial pressure of 3 mmHg. Conclusion(s)/Recommendation(s): No evidence of vegetation/infective endocarditis on this transesophageael echocardiogram. FINDINGS  Left Ventricle: Left ventricular ejection fraction, by estimation, is 60 to 65%. The left ventricle has normal function. The left ventricle has no regional wall motion abnormalities. The left ventricular internal cavity size was normal in size. There is  no left ventricular hypertrophy. Right Ventricle: The right ventricular size is normal. No increase in right ventricular wall thickness. Right ventricular systolic function is normal. Left Atrium: Left atrial size was mildly dilated. No left atrial/left atrial appendage thrombus was detected. Right Atrium: Right atrial size was mildly dilated. Pericardium: There is no evidence of pericardial effusion. Mitral Valve: The mitral valve is normal in structure. No evidence of mitral valve regurgitation. No evidence of mitral valve stenosis. Tricuspid Valve: The tricuspid valve is normal in structure. Tricuspid valve regurgitation is not demonstrated. No evidence of tricuspid stenosis. Aortic Valve: The aortic valve is normal in structure. Aortic valve regurgitation is not visualized. No aortic stenosis is present. Pulmonic Valve: The pulmonic valve was normal in structure. Pulmonic valve regurgitation is not visualized. No evidence of pulmonic stenosis. Aorta: The aortic root is normal in size and structure. Venous: The inferior vena cava is normal in size with greater than 50% respiratory variability, suggesting right atrial pressure of 3 mmHg. IAS/Shunts: No atrial level shunt detected by color flow Doppler. Neoma Laming Electronically signed by Neoma Laming Signature Date/Time: 03/30/2022/9:11:25 AM    Final    ? ?Mild midthoracic degenerative disease without spinal canal stenosis. ?There is a small central disc protrusion at T7-8 and  a small left ?subarticular disc protrusion at T8-9 ?  ?Assessment/Plan: ?76 yr female with metastatic  lung cancer presenting with weakness and hypotension and found to have bacteremia ?  ?Streptococcus bacteremia- strep mitis- source usually GI tract/skin ?She has no pneumonia or GI source ?TEE neg ?repeated thoracic spine MRI with contrast and no evidence of discitis/paraspinal abscess ?IV antibiotic day 6 ?As TEE neg can give a total of 7 days of IV antibiotic followed by PO Amoxil 500mg  PO TID until 04/03/22 ?PORT need not be removed ?  ? ?E.coli in urine but no urinary symptoms - so no Rx needed ?  ?Metastatic lung cancer with cerebral mets-, S/p 4 cycles of Carboplatin, Etoposide  and Tecentriq.   ?Currently on Tecentriq maintenance.last dose on 03/09/22 ?  ?Anemia ?  ?Transaminitis ?  ?Recent PE ?  ?H/o  CVA- multiple cerebellar infarcts ?  ? ?Discussed the management with  hospitalist ?ID will sign off- call if needed ?  ?

## 2022-03-30 NOTE — Care Management Important Message (Signed)
Important Message ? ?Patient Details  ?Name: Kristi [name] [facility] ?MRN: [id number] ?Date of Birth: 13-Feb-1946 ? ? ?Medicare Important Message Given:  Yes ? ? ? ? ?Dannette Barbara ?03/30/2022, 12:07 PM ?

## 2022-03-30 NOTE — Progress Notes (Signed)
*  PRELIMINARY RESULTS* ?Echocardiogram ?Echocardiogram Transesophageal has been performed. ? ?Chrisangel Eskenazi, Sonia Side ?03/30/2022, 8:56 AM ?

## 2022-03-30 NOTE — Progress Notes (Signed)
Occupational Therapy Treatment ?Patient Details ?Name: Kristi [name] [facility] ?MRN: [id number] ?DOB: 1945/12/29 ?Today's Date: 03/30/2022 ? ? ?History of present illness Patient is a 76 year old female with past medical history of MI, chronic T7 Fx, CAD, small cell lung cancer with right chest wall catheter implant status postchemotherapy 3 weeks prior who was just discharged from the hospitalist service on 3/29 for weakness and went to a skilled nursing facility.  She presented to the emergency room on 4/3 with complaints of weakness.  At that time, blood cultures were done.  Patient sent back to skilled nursing facility.  Those blood cultures came back positive for Streptococcus species in both bottles and patient sent back to the emergency room on the night of 4/4. MD assessment includes streptococcal bacteremia, hypotension, PE found during prior hospitalization, and hypokalemia. ?  ?OT comments ? Pt seen for OT tx. Pt agreeable to session. Pt required supervision to come to the edge of the bed with increased time/effort needed. Pt required VC for hand placement and RW mgt for ADL transfers with CGA-MIN A from std height bed and toilet. A couple VC while ambulating to/from bathroom for keeping the RW closer to her. Pt endorsed mild dizziness after getting up from the toilet which resolved once she sat down in the recliner. Left with all needs in reach, spouse present. Pt continues to benefit from skilled OT services.   ? ?Recommendations for follow up therapy are one component of a multi-disciplinary discharge planning process, led by the attending physician.  Recommendations may be updated based on patient status, additional functional criteria and insurance authorization. ?   ?Follow Up Recommendations ? Home health OT  ?  ?Assistance Recommended at Discharge Frequent or constant Supervision/Assistance  ?Patient can return home with the following ? A little help with walking and/or transfers;Assistance with  cooking/housework;Direct supervision/assist for financial management;Assist for transportation;Direct supervision/assist for medications management;A little help with bathing/dressing/bathroom ?  ?Equipment Recommendations ? BSC/3in1  ?  ?Recommendations for Other Services   ? ?  ?Precautions / Restrictions Precautions ?Precautions: Fall ?Restrictions ?Weight Bearing Restrictions: No ?Other Position/Activity Restrictions: RUQ port  ? ? ?  ? ?Mobility Bed Mobility ?Overal bed mobility: Needs Assistance ?Bed Mobility: Supine to Sit ?  ?  ?Supine to sit: Min guard, HOB elevated ?  ?  ?General bed mobility comments: increased time/effort, but no direct assist required ?  ? ?Transfers ?Overall transfer level: Needs assistance ?Equipment used: Rolling walker (2 wheels) ?Transfers: Sit to/from Stand ?Sit to Stand: Min guard, Min assist ?  ?  ?  ?  ?  ?General transfer comment: from std height bed and toiler, requiring CGA-MINA  with VC for hand placement/RW mgt ?  ?  ?Balance Overall balance assessment: Needs assistance ?Sitting-balance support: No upper extremity supported, Feet supported, Single extremity supported ?Sitting balance-Leahy Scale: Fair ?  ?  ?Standing balance support: Bilateral upper extremity supported, During functional activity, No upper extremity supported ?Standing balance-Leahy Scale: Fair ?Standing balance comment: a couple slight LOB requiring CGA-MIN A to correct in standing ?  ?  ?  ?  ?  ?  ?  ?  ?  ?  ?  ?   ? ?ADL either performed or assessed with clinical judgement  ? ?ADL Overall ADL's : Needs assistance/impaired ?  ?  ?Grooming: Standing;Min guard;Wash/dry hands ?  ?  ?  ?  ?  ?  ?  ?  ?  ?Toilet Transfer: Minimal assistance;Grab bars;Rolling walker (2 wheels);Comfort height  toilet ?Toilet Transfer Details (indicate cue type and reason): VC for hand placement and RW placement to improve safety ?Toileting- Clothing Manipulation and Hygiene: Sit to/from stand;Min guard ?  ?  ?  ?Functional  mobility during ADLs: Minimal assistance;Min guard ?General ADL Comments: 2 episodes of slight LOB requiring MIN A to correct ?  ? ?Extremity/Trunk Assessment   ?  ?  ?  ?  ?  ? ?Vision   ?  ?  ?Perception   ?  ?Praxis   ?  ? ?Cognition Arousal/Alertness: Awake/alert ?Behavior During Therapy: [facility] for tasks assessed/performed ?Overall Cognitive Status: Within Functional Limits for tasks assessed ?  ?  ?  ?  ?  ?  ?  ?  ?  ?  ?  ?  ?  ?  ?  ?  ?General Comments: decr safety awareness requiring VC ?  ?  ?   ?Exercises   ? ?  ?Shoulder Instructions   ? ? ?  ?General Comments    ? ? ?Pertinent Vitals/ Pain       Pain Assessment ?Pain Assessment: No/denies pain ? ?Home Living   ?  ?  ?  ?  ?  ?  ?  ?  ?  ?  ?  ?  ?  ?  ?  ?  ?  ?  ? ?  ?Prior Functioning/Environment    ?  ?  ?  ?   ? ?Frequency ? Min 2X/week  ? ? ? ? ?  ?Progress Toward Goals ? ?OT Goals(current goals can now be found in the care plan section) ? Progress towards OT goals: Progressing toward goals ? ?Acute Rehab OT Goals ?Patient Stated Goal: to go home and be able to do as much as I can ?OT Goal Formulation: With patient/family ?Time For Goal Achievement: 04/11/22 ?Potential to Achieve Goals: Good  ?Plan Discharge plan remains appropriate;Frequency remains appropriate   ? ?Co-evaluation ? ? ?   ?  ?  ?  ?  ? ?  ?AM-PAC OT "6 Clicks" Daily Activity     ?Outcome Measure ? ? Help from another person eating meals?: None ?Help from another person taking care of personal grooming?: A Little ?Help from another person toileting, which includes using toliet, bedpan, or urinal?: A Little ?Help from another person bathing (including washing, rinsing, drying)?: A Lot ?Help from another person to put on and taking off regular upper body clothing?: A Little ?Help from another person to put on and taking off regular lower body clothing?: A Little ?6 Click Score: 18 ? ?  ?End of Session Equipment Utilized During Treatment: Rolling walker (2 wheels);Gait belt ? ?OT Visit  Diagnosis: Unsteadiness on feet (R26.81);History of falling (Z91.81);Muscle weakness (generalized) (M62.81) ?  ?Activity Tolerance Patient tolerated treatment well ?  ?Patient Left in chair;with call bell/phone within reach;with family/visitor present ?  ?Nurse Communication   ?  ? ?   ? ?Time: 9622-2979 ?OT Time Calculation (min): 24 min ? ?Charges: OT General Charges ?$OT Visit: 1 Visit ?OT Treatments ?$Self Care/Home Management : 23-37 mins ? ?Ardeth Perfect., MPH, MS, OTR/L ?ascom [phone] ?03/30/22, 1:35 PM ?

## 2022-03-30 NOTE — Assessment & Plan Note (Signed)
We will try lactulose. ?

## 2022-03-30 NOTE — Progress Notes (Signed)
[facility] Progress Note ? ?Patient: Lenore Moyano [facility]    HQR:[id number]  DOA: 03/24/2022    ?Date of Service: the patient was seen and examined on 03/30/2022 ? 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  She presented to the emergency room on 4/3 with complaints of weakness.  At that time, blood cultures were done.  Patient sent back to skilled nursing facility.  Those blood cultures came back positive for Streptococcus species in both bottles and patient sent back to the emergency room on the night of 4/4.  Repeat blood cultures were done.  Patient noted to have soft blood pressures with systolic in the 98Y to 64B.  Patient given IV fluids and IV antibiotics. ? ?Assessment and Plan: ?Assessment and Plan: ?* Streptococcal bacteremia ?Admitted to the hospitalist service.  Started on IV Rocephin.  Infectious disease consulted.  Continue IV fluids.  Lactic acid level normalized.  Unclear etiology.  No evidence of GI or pneumonia infection.  2D echo unremarkable.  Underwent TEE on 4/10 which was unremarkable.  Patient will complete 7 days of IV antibiotics on 4/11 and then 3 days of p.o. upon discharge ? ?Hypotension ?An ongoing issue.  Does not appear to be sepsis.  Previously been on midodrine, so have restarted. ? ?Dyslipidemia ?- We will continue statin therapy ? ?Acute pulmonary embolism ([place]) ?Found during last hospitalization.  Started on Xarelto which we will continue. ? ?Constipation ?We will try lactulose. ? ?Hypokalemia ?Replacing as needed. ? ?GERD without esophagitis ?- We will continue Carafate and PPI therapy. ? ?Small cell lung cancer ([place]) ?No chemo at this time until patient improves. ? ?Grade I diastolic dysfunction ?Euvolemic.  BNP within  normal limits.  Patient complaining of some increased dyspnea on exertion, but BNP on 4/9 still normal.  Chest x-ray unremarkable. ? 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  Pain initially improved with lidocaine patch.   ? ? ? ? ? ? ?Body mass index is 24.14 kg/m?.  ?  ?   ? ?Consultants: ?Infectious disease ? ?Procedures: ?Echocardiogram unremarkable.  Grade 1 diastolic dysfunction noted. ?TEE unremarkable ?Antimicrobials: ?IV vancomycin 4/4 x 1 dose ?IV Rocephin 4/4-present ? ?Code Status: DNR ? ? ?Subjective: Patient complains of constipation ? ?Objective: ?Soft blood pressures noted ?Vitals:  ? 03/30/22 0930 03/30/22 1145  ?BP: 115/73 114/70  ?Pulse: 74 90  ?Resp: 15   ?Temp:  97.6 ?F (36.4 ?C)  ?SpO2: 97% 96%  ? ? ?Intake/Output Summary (Last 24 hours) at 03/30/2022 1507 ?Last data filed at 03/29/2022 2343 ?Gross per 24 hour  ?Intake 1366.02 ml  ?Output 250 ml  ?Net 1116.02 ml  ? ? ?Filed Weights  ? 03/27/22 0701  ?Weight: 65.8 kg  ? ?Body mass index is 24.14 kg/m?. ? ?Exam: ? ?General: Alert and oriented x3, no acute distress ?HEENT: Normocephalic atraumatic, mucous membranes are moist ?Cardiovascular: Regular rate and rhythm, S1-S2 ?Respiratory: Clear to auscultation bilaterally ?Abdomen: Soft, nontender, nondistended, positive bowel sounds ?Musculoskeletal: No clubbing or cyanosis or edema ?Skin: No skin breaks, tears or lesions ?Psychiatry: Appropriate, no evidence of psychoses ?Neurology: Lower extremity weakness, present from previous hospitalization. ? ?Data Reviewed: ?No labs today. ? ?Disposition:  ?Status is: Inpatient ?Remains inpatient appropriate because: Completion of IV antibiotics ?  ? ?Anticipated  discharge date: 4/11, discharged home ? ?Remaining issues to be resolved so that patient can be discharged: Completion of IV antibiotics ? ? ?Family Communication: Husband at the bedside ?DVT Prophylaxis: ? ?Rivaroxaban (XARELTO) tablet 15 mg  ?rivaroxaban (XARELTO) tablet 20 mg  ? ? ?Author: ?Annita Brod ,MD ?03/30/2022 3:07 PM ? ?To reach On-call, see care teams to locate the attending and reach out via [email or web address]. ?Between 7PM-7AM, please contact night-coverage ?If you still have difficulty reaching the attending provider, please page the [facility] (Director on Call) for [facility] on amion for assistance. ? ?

## 2022-03-30 NOTE — Progress Notes (Signed)
SUBJECTIVE: No complaints ? ? ?Vitals:  ? 03/29/22 1956 03/30/22 0004 03/30/22 0352 03/30/22 0753  ?BP: 94/67 102/71 100/77 92/66  ?Pulse: 76 75 84 71  ?Resp: 17 18 20    ?Temp: 98.7 ?F (37.1 ?C) 98.3 ?F (36.8 ?C) 97.8 ?F (36.6 ?C)   ?TempSrc:  Oral    ?SpO2: 94% 95% 95% 96%  ?Weight:      ?Height:      ? ? ?Intake/Output Summary (Last 24 hours) at 03/30/2022 0813 ?Last data filed at 03/29/2022 2343 ?Gross per 24 hour  ?Intake 1566.83 ml  ?Output 250 ml  ?Net 1316.83 ml  ? ? ?LABS: ?Basic Metabolic Panel: ?Recent Labs  ?  03/28/22 ?0449  ?NA 138  ?K 3.6  ?CL 110  ?CO2 21*  ?GLUCOSE 104*  ?BUN 8  ?CREATININE 0.96  ?CALCIUM 8.9  ? ?Liver Function Tests: ?No results for input(s): AST, ALT, ALKPHOS, BILITOT, PROT, ALBUMIN in the last 72 hours. ?No results for input(s): LIPASE, AMYLASE in the last 72 hours. ?CBC: ?No results for input(s): WBC, NEUTROABS, HGB, HCT, MCV, PLT in the last 72 hours. ?Cardiac Enzymes: ?No results for input(s): CKTOTAL, CKMB, CKMBINDEX, TROPONINI in the last 72 hours. ?BNP: ?Invalid input(s): POCBNP ?D-Dimer: ?No results for input(s): DDIMER in the last 72 hours. ?Hemoglobin A1C: ?No results for input(s): HGBA1C in the last 72 hours. ?Fasting Lipid Panel: ?No results for input(s): CHOL, HDL, LDLCALC, TRIG, CHOLHDL, LDLDIRECT in the last 72 hours. ?Thyroid Function Tests: ?No results for input(s): TSH, T4TOTAL, T3FREE, THYROIDAB in the last 72 hours. ? ?Invalid input(s): FREET3 ?Anemia Panel: ?No results for input(s): VITAMINB12, FOLATE, FERRITIN, TIBC, IRON, RETICCTPCT in the last 72 hours. ? ? ?PHYSICAL EXAM ?General: Well developed, well nourished, in no acute distress ?HEENT:  Normocephalic and atramatic ?Neck:  No JVD.  ?Lungs: Clear bilaterally to auscultation and percussion. ?Heart: HRRR . Normal S1 and S2 without gallops or murmurs.  ?Abdomen: Bowel sounds are positive, abdomen soft and non-tender  ?Msk:  Back normal, normal gait. Normal strength and tone for age. ?Extremities: No clubbing,  cyanosis or edema.   ?Neuro: Alert and oriented X 3. ?Psych:  Good affect, responds appropriately ? ?TELEMETRY: Sinus rhythm ? ?ASSESSMENT AND PLAN: Streptococcal bacteremia rule out endocarditis.  Patient is set up for TEE this morning. ? ?Principal Problem: ?  Streptococcal bacteremia ?Active Problems: ?  Grade I diastolic dysfunction ?  Small cell lung cancer ([place]) ?  GERD without esophagitis ?  Acute pulmonary embolism ([place]) ?  Closed T7 fracture ([place]) ?  Hypotension ?  Hypokalemia ?  Dyslipidemia ?  ? ?Dionisio David, MD, FACC ?03/30/2022 ?8:13 AM ? ? ? ?  ?

## 2022-03-30 NOTE — Progress Notes (Signed)
Physical Therapy Treatment ?Patient Details ?Name: Kristi [name] [facility] ?MRN: [id number] ?DOB: 03/14/1946 ?Today's Date: 03/30/2022 ? ? 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  She presented to the emergency room on 4/3 with complaints of weakness.  At that time, blood cultures were done.  Patient sent back to skilled nursing facility.  Those blood cultures came back positive for Streptococcus species in both bottles and patient sent back to the emergency room on the night of 4/4. MD assessment includes streptococcal bacteremia, hypotension, PE found during prior hospitalization, and hypokalemia. ? ?  ?PT Comments  ? ? Pt was pleasant and motivated to participate during the session and put forth good effort throughout. ?Pt reported feeling dizzy earlier in the day while walking in the room so BP taken in supine at 103/70 and then in sitting at 116/70 without adverse symptoms.  Pt able to transfer and ambulate without physical assistance but did require verbal cues for general sequencing.  No adverse symptoms during ambulation with SpO2 and HR WNL on room air.  Pt will benefit from HHPT upon discharge to safely address deficits listed in patient problem list for decreased caregiver assistance and eventual return to PLOF. ?   ?Recommendations for follow up therapy are one component of a multi-disciplinary discharge planning process, led by the attending physician.  Recommendations may be updated based on patient status, additional functional criteria and insurance authorization. ? ?Follow Up Recommendations ? Home health PT ?  ?  ?Assistance Recommended at Discharge Frequent or constant Supervision/Assistance  ?Patient can return home with  the following Assistance with cooking/housework;Direct supervision/assist for medications management;Assist for transportation;A little help with walking and/or transfers;A little help with bathing/dressing/bathroom ?  ?Equipment Recommendations ? Rolling walker (2 wheels)  ?  ?Recommendations for Other Services   ? ? ?  ?Precautions / Restrictions Precautions ?Precautions: Fall ?Restrictions ?Weight Bearing Restrictions: No ?Other Position/Activity Restrictions: RUQ port  ?  ? ?Mobility ? Bed Mobility ?Overal bed mobility: Needs Assistance ?Bed Mobility: Supine to Sit ?  ?  ?Supine to sit: Supervision ?  ?  ?General bed mobility comments: increased time/effort, but no direct assist required ?  ? ?Transfers ?Overall transfer level: Needs assistance ?Equipment used: Rolling walker (2 wheels) ?Transfers: Sit to/from Stand ?Sit to Stand: Min guard ?  ?  ?  ?  ?  ?General transfer comment: Min verbal cues for hand placement ?  ? ?Ambulation/Gait ?Ambulation/Gait assistance: Min guard ?Gait Distance (Feet): 180 Feet ?Assistive device: Rolling walker (2 wheels) ?Gait Pattern/deviations: Step-through pattern, Decreased step length - right, Decreased step length - left, Trunk flexed ?Gait velocity: decreased ?  ?  ?General Gait Details: Slow cadence with mild drifting to the L, chronic per patient, but improved with cuing; Pt steady during amb with no overt LOB ? ? ?Stairs ?  ?  ?  ?  ?  ? ? ?Wheelchair Mobility ?  ? ?Modified Rankin (Stroke Patients Only) ?  ? ? ?  ?Balance Overall balance assessment: Needs assistance ?Sitting-balance support: No upper extremity supported, Feet supported, Single extremity supported ?Sitting balance-Leahy Scale: Fair ?  ?  ?Standing balance support: Bilateral upper extremity supported, During functional activity ?Standing balance-Leahy Scale: Fair ?  ?  ?  ?  ?  ?  ?  ?  ?  ?  ?  ?  ?  ? ?  ?  Cognition Arousal/Alertness: Awake/alert ?Behavior During Therapy: [facility] for tasks  assessed/performed ?Overall Cognitive Status: Within Functional Limits for tasks assessed ?  ?  ?  ?  ?  ?  ?  ?  ?  ?  ?  ?  ?  ?  ?  ?  ?  ?  ?  ? ?  ?Exercises   ? ?  ?General Comments   ?  ?  ? ?Pertinent Vitals/Pain Pain Assessment ?Pain Assessment: No/denies pain  ? ? ?Home Living   ?  ?  ?  ?  ?  ?  ?  ?  ?  ?   ?  ?Prior Function    ?  ?  ?   ? ?PT Goals (current goals can now be found in the care plan section) Progress towards PT goals: Progressing toward goals ? ?  ?Frequency ? ? ? Min 2X/week ? ? ? ?  ?PT Plan Current plan remains appropriate  ? ? ?Co-evaluation   ?  ?  ?  ?  ? ?  ?AM-PAC PT "6 Clicks" Mobility   ?Outcome Measure ? Help needed turning from your back to your side while in a flat bed without using bedrails?: A Little ?Help needed moving from lying on your back to sitting on the side of a flat bed without using bedrails?: A Little ?Help needed moving to and from a bed to a chair (including a wheelchair)?: A Little ?Help needed standing up from a chair using your arms (e.g., wheelchair or bedside chair)?: A Little ?Help needed to walk in hospital room?: A Little ?Help needed climbing 3-5 steps with a railing? : A Lot ?6 Click Score: 17 ? ?  ?End of Session Equipment Utilized During Treatment: Gait belt ?Activity Tolerance: Patient tolerated treatment well ?Patient left: in chair;with call bell/phone within reach;with chair alarm set;with family/visitor present ?Nurse Communication: Mobility status ?PT Visit Diagnosis: Difficulty in walking, not elsewhere classified (R26.2);Muscle weakness (generalized) (M62.81);History of falling (Z91.81) ?  ? ? ?Time: 6803-2122 ?PT Time Calculation (min) (ACUTE ONLY): 28 min ? ?Charges:  $Gait Training: 8-22 mins ?$Therapeutic Activity: 8-22 mins          ?          ?D. Royetta Asal PT, DPT ?03/30/22, 4:41 PM ? ? ?

## 2022-03-31 ENCOUNTER — Encounter: Payer: Self-pay | Admitting: Cardiovascular Disease

## 2022-03-31 DIAGNOSIS — K5900 Constipation, unspecified: Secondary | ICD-10-CM | POA: Diagnosis not present

## 2022-03-31 DIAGNOSIS — R7881 Bacteremia: Secondary | ICD-10-CM | POA: Diagnosis not present

## 2022-03-31 DIAGNOSIS — I5189 Other ill-defined heart diseases: Secondary | ICD-10-CM | POA: Diagnosis not present

## 2022-03-31 DIAGNOSIS — I959 Hypotension, unspecified: Secondary | ICD-10-CM | POA: Diagnosis not present

## 2022-03-31 LAB — CBC
HCT: 32 % — ABNORMAL LOW (ref 36.0–46.0)
Hemoglobin: 10.3 g/dL — ABNORMAL LOW (ref 12.0–15.0)
MCH: 29.1 pg (ref 26.0–34.0)
MCHC: 32.2 g/dL (ref 30.0–36.0)
MCV: 90.4 fL (ref 80.0–100.0)
Platelets: 242 10*3/uL (ref 150–400)
RBC: 3.54 MIL/uL — ABNORMAL LOW (ref 3.87–5.11)
RDW: 15 % (ref 11.5–15.5)
WBC: 7.9 10*3/uL (ref 4.0–10.5)
nRBC: 0 % (ref 0.0–0.2)

## 2022-03-31 MED ORDER — MIDODRINE HCL 5 MG PO TABS: 5.0000 mg | ORAL_TABLET | Freq: Three times a day (TID) | ORAL | 1 refills | Status: DC

## 2022-03-31 MED ORDER — AMOXICILLIN 500 MG PO CAPS: 500.0000 mg | ORAL_CAPSULE | Freq: Three times a day (TID) | ORAL | 0 refills | Status: DC

## 2022-03-31 NOTE — Discharge Summary (Signed)
?Physician Discharge Summary ?  ?Patient: Kristi [name] MRN: [id number] DOB: 10/09/1946  ?Admit date:     03/24/2022  ?Discharge date: 03/31/22  ?Discharge Physician: Annita Brod  ? ?PCP: Glean Hess, MD  ? ?Recommendations at discharge:  ? ?Patient being discharged home with home health ?New medication: Amoxicillin 500 mg p.o. 3 times daily x3 days ? ?Discharge Diagnoses: ?Principal Problem: ?  Streptococcal bacteremia ?Active Problems: ?  Hypotension ?  Dyslipidemia ?  Acute pulmonary embolism ([place]) ?  Hypokalemia ?  Constipation ?  Small cell lung cancer ([place]) ?  GERD without esophagitis ?  Grade I diastolic dysfunction ?  Closed T7 fracture ([place]) ? ?Resolved Problems: ?  * No resolved hospital problems. * ? 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  She presented to the emergency room on 4/3 with complaints of weakness.  At that time, blood cultures were done.  Patient sent back to skilled nursing facility.  Those blood cultures came back positive for Streptococcus species in both bottles and patient sent back to the emergency room on the night of 4/4.  Repeat blood cultures were done.  Patient noted to have soft blood pressures with systolic in the 41Y to 60Y.  Patient given IV fluids and IV antibiotics.  Work-up including 2D echo and TEE were unremarkable.  By 4/11, patient had completed 7 days of IV penicillin.  Infectious disease following recommending 3 more days of p.o. antibiotics for total of 10 days of therapy.  Patient had been in skilled nursing and had come from the, she has progressed somewhat and would prefer to go home so she will be discharged with home health. ? ?Assessment and Plan: ?* Streptococcal bacteremia ?Admitted to the  hospitalist service.  Started on IV Rocephin.  Infectious disease consulted.  Continue IV fluids.  Lactic acid level normalized.  Unclear etiology.  No evidence of GI or pneumonia infection.  2D echo unremarkable.  Underwent TEE on 4/10 which was unremarkable.  Patient will complete 7 days of IV antibiotics on 4/11 and then 3 days of p.o. upon discharge ? ?Hypotension ?An ongoing issue.  Does not appear to be sepsis.  Previously been on midodrine, so have restarted. ? ?Dyslipidemia ?- We will continue statin therapy ? ?Acute pulmonary embolism ([place]) ?Found during last hospitalization.  Started on Xarelto which we will continue. ? ?Constipation ?We will try lactulose. ? ?Hypokalemia ?Replacing as needed. ? ?GERD without esophagitis ?- We will continue Carafate and PPI therapy. ? ?Small cell lung cancer ([place]) ?No chemo at this time until patient improves. ? ?Grade I diastolic dysfunction ?Euvolemic.  BNP within normal limits.  Patient complaining of some increased dyspnea on exertion, but BNP on 4/9 still normal.  Chest x-ray unremarkable. ? 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  Pain initially improved with lidocaine patch.   ? ? ? ? ?  ? ? ?Consultants: Infectious disease, cardiology ?Procedures performed: 2D echo, TEE ?Disposition: Home ?Diet recommendation:  ?Discharge Diet Orders (From admission, onward)  ? ?  Start     Ordered  ? 03/31/22 0000  Diet - low sodium heart healthy       ? 03/31/22 1311  ? ?  ?  ? ?  ? ?Regular diet ?DISCHARGE MEDICATION: ?  Allergies as of 03/31/2022   ?No Known Allergies ?  ? ?  ?Medication List  ?  ? ?STOP taking these medications   ? ?cephALEXin 250 MG capsule ?Commonly known as: KEFLEX ?  ?fludrocortisone 0.1 MG tablet ?Commonly known as: FLORINEF ?  ? ?  ? ?TAKE these medications   ? ?acetaminophen 500 MG tablet ?Commonly known as: TYLENOL ?Take 2 tablets (1,000 mg total) by mouth every 6 (six) hours as needed for mild pain or headache. ?  ?alum & mag hydroxide-simeth  200-200-20 MG/5ML suspension ?Commonly known as: MAALOX/MYLANTA ?Take 30 mLs by mouth every 6 (six) hours as needed for indigestion or heartburn. ?  ?amoxicillin 500 MG capsule ?Commonly known as: AMOXIL ?Take 1 capsule (500 mg total) by mouth every 8 (eight) hours. ?Start taking on: April 01, 2022 ?  ?aspirin EC 81 MG tablet ?Take 1 tablet (81 mg total) by mouth daily. Swallow whole. ?  ?atorvastatin 40 MG tablet ?Commonly known as: LIPITOR ?Take 1 tablet (40 mg total) by mouth daily. ?  ?Calcium 600-200 MG-UNIT tablet ?Take 1 tablet by mouth 2 (two) times daily. ?  ?Dialyvite Vitamin D 5000 125 MCG (5000 UT) capsule ?Generic drug: Cholecalciferol ?Take 5,000 Units by mouth daily. ?  ?diphenhydrAMINE 25 mg capsule ?Commonly known as: BENADRYL ?Take 1 capsule (25 mg total) by mouth at bedtime as needed (if no sleep even with trazodone). ?  ?lidocaine 5 % ?Commonly known as: LIDODERM ?Place 1 patch onto the skin daily. Remove & Discard patch within 12 hours or as directed by MD ?  ?lidocaine-prilocaine cream ?Commonly known as: EMLA ?Apply 1 application topically daily as needed (port access). ?  ?Magnesium 250 MG Tabs ?Take 250 mg by mouth daily. ?  ?midodrine 5 MG tablet ?Commonly known as: PROAMATINE ?Take 1 tablet (5 mg total) by mouth 3 (three) times daily with meals. ?  ?pantoprazole 40 MG tablet ?Commonly known as: Protonix ?Take 1 tablet (40 mg total) by mouth 2 (two) times daily. ?  ?polyethylene glycol 17 g packet ?Commonly known as: MIRALAX / GLYCOLAX ?Take 17 g by mouth daily as needed for mild constipation. ?  ?prochlorperazine 10 MG tablet ?Commonly known as: COMPAZINE ?Take 10 mg by mouth every 6 (six) hours as needed for nausea or vomiting. ?  ?Rivaroxaban 15 MG Tabs tablet ?Commonly known as: XARELTO ?Take 1 tablet (15 mg total) by mouth 2 (two) times daily with a meal for 18 days. ?  ?rivaroxaban 20 MG Tabs tablet ?Commonly known as: XARELTO ?Take 1 tablet (20 mg total) by mouth daily with  supper. ?Start taking on: April 05, 2022 ?  ?sertraline 100 MG tablet ?Commonly known as: ZOLOFT ?Take 1 tablet (100 mg total) by mouth daily. ?  ?sucralfate 1 g tablet ?Commonly known as: CARAFATE ?Take 1 g by mouth 3 (three) times daily as needed (heartburn). ?  ?traZODone 50 MG tablet ?Commonly known as: DESYREL ?Take 1 tablet (50 mg total) by mouth at bedtime as needed for sleep. ?  ?vitamin B-12 1000 MCG tablet ?Commonly known as: CYANOCOBALAMIN ?Take 1,000 mcg by mouth daily. ?  ? ?  ? ?  ?  ? ? ?  ?Durable Medical Equipment  ?(From admission, onward)  ?  ? ? ?  ? ?  Start     Ordered  ? 03/27/22 1617  For home use only DME Walker rolling  Once       ?Question Answer Comment  ?Walker: With 5 Inch Wheels   ?  Patient needs a walker to treat with the following condition Difficulty walking   ?  ? 03/27/22 1617  ? ?  ?  ? ?  ? ? ?Discharge Exam: ?Filed Weights  ? 03/27/22 0701  ?Weight: 65.8 kg  ? ?General: Alert and oriented x3, no acute distress ?Cardiovascular: Regular rate and rhythm, S1-S2 ?Lungs: Clear to auscultation bilaterally ? ?Condition at discharge: good ? ?The results of significant diagnostics from this hospitalization (including imaging, microbiology, ancillary and laboratory) are listed below for reference.  ? ?Imaging Studies: ?CT ANGIO HEAD NECK W WO CM ? ?Result Date: 03/13/2022 ?CLINICAL DATA:  Acute neurologic deficit EXAM: CT ANGIOGRAPHY HEAD AND NECK TECHNIQUE: Multidetector CT imaging of the head and neck was performed using the standard protocol during bolus administration of intravenous contrast. Multiplanar CT image reconstructions and MIPs were obtained to evaluate the vascular anatomy. Carotid stenosis measurements (when applicable) are obtained utilizing NASCET criteria, using the distal internal carotid diameter as the denominator. RADIATION DOSE REDUCTION: This exam was performed according to the departmental dose-optimization program which includes automated exposure control,  adjustment of the mA and/or kV according to patient size and/or use of iterative reconstruction technique. CONTRAST:  41mL OMNIPAQUE IOHEXOL 350 MG/ML SOLN COMPARISON:  None. FINDINGS: CTA NECK FINDINGS SKELETON: There is no bo

## 2022-03-31 NOTE — TOC Progression Note (Addendum)
Transition of Care (TOC) - Progression Note  ? ? ?Patient Details  ?Name: Zainah Steven [facility] ?MRN: [id number] ?Date of Birth: 22-Aug-1946 ? ?Transition of Care (TOC) CM/SW Contact  ?Laurena Slimmer, RN ?Phone Number: ?03/31/2022, 1:32 PM ? ?Clinical Narrative:    ? ?Patient declined rolling walker. Stated she has rolling walker with 2 wheels and a wheel chair at home if needed. Corene Cornea from [facility] notified. TOC signing off. ? ?Expected Discharge Plan: [place] ?Barriers to Discharge: Continued Medical Work up ? ?Expected Discharge Plan and Services ?Expected Discharge Plan: [place] ?  ?  ?  ?Living arrangements for the past 2 months: [place], [place] ?Expected Discharge Date: 03/31/22               ?  ?  ?  ?  ?  ?  ?  ?  ?  ?  ? ? ?Social Determinants of Health (SDOH) Interventions ?  ? ?Readmission Risk Interventions ? ?  03/27/2022  ?  2:30 PM  ?Readmission Risk Prevention Plan  ?Transportation Screening Complete  ?PCP or Specialist Appt within 3-5 Days Complete  ?Social Work Consult for [place] Planning/Counseling Complete  ?Palliative Care Screening Not Applicable  ?Medication Review Press photographer) Complete  ? ? ?

## 2022-04-03 ENCOUNTER — Telehealth: Payer: Self-pay | Admitting: Internal Medicine

## 2022-04-03 DIAGNOSIS — I700 Atherosclerosis of aorta: Secondary | ICD-10-CM | POA: Diagnosis not present

## 2022-04-03 DIAGNOSIS — I959 Hypotension, unspecified: Secondary | ICD-10-CM | POA: Diagnosis not present

## 2022-04-03 DIAGNOSIS — C7931 Secondary malignant neoplasm of brain: Secondary | ICD-10-CM | POA: Diagnosis not present

## 2022-04-03 DIAGNOSIS — I252 Old myocardial infarction: Secondary | ICD-10-CM | POA: Diagnosis not present

## 2022-04-03 DIAGNOSIS — K5900 Constipation, unspecified: Secondary | ICD-10-CM | POA: Diagnosis not present

## 2022-04-03 DIAGNOSIS — Z9181 History of falling: Secondary | ICD-10-CM | POA: Diagnosis not present

## 2022-04-03 DIAGNOSIS — M8580 Other specified disorders of bone density and structure, unspecified site: Secondary | ICD-10-CM | POA: Diagnosis not present

## 2022-04-03 DIAGNOSIS — R7881 Bacteremia: Secondary | ICD-10-CM | POA: Diagnosis not present

## 2022-04-03 DIAGNOSIS — R1310 Dysphagia, unspecified: Secondary | ICD-10-CM | POA: Diagnosis not present

## 2022-04-03 DIAGNOSIS — G3184 Mild cognitive impairment, so stated: Secondary | ICD-10-CM | POA: Diagnosis not present

## 2022-04-03 DIAGNOSIS — M5030 Other cervical disc degeneration, unspecified cervical region: Secondary | ICD-10-CM | POA: Diagnosis not present

## 2022-04-03 DIAGNOSIS — E876 Hypokalemia: Secondary | ICD-10-CM | POA: Diagnosis not present

## 2022-04-03 DIAGNOSIS — M1711 Unilateral primary osteoarthritis, right knee: Secondary | ICD-10-CM | POA: Diagnosis not present

## 2022-04-03 DIAGNOSIS — D630 Anemia in neoplastic disease: Secondary | ICD-10-CM | POA: Diagnosis not present

## 2022-04-03 DIAGNOSIS — G8929 Other chronic pain: Secondary | ICD-10-CM | POA: Diagnosis not present

## 2022-04-03 DIAGNOSIS — I25118 Atherosclerotic heart disease of native coronary artery with other forms of angina pectoris: Secondary | ICD-10-CM | POA: Diagnosis not present

## 2022-04-03 DIAGNOSIS — K219 Gastro-esophageal reflux disease without esophagitis: Secondary | ICD-10-CM | POA: Diagnosis not present

## 2022-04-03 DIAGNOSIS — I119 Hypertensive heart disease without heart failure: Secondary | ICD-10-CM | POA: Diagnosis not present

## 2022-04-03 DIAGNOSIS — R7303 Prediabetes: Secondary | ICD-10-CM | POA: Diagnosis not present

## 2022-04-03 DIAGNOSIS — E7800 Pure hypercholesterolemia, unspecified: Secondary | ICD-10-CM | POA: Diagnosis not present

## 2022-04-03 DIAGNOSIS — I2699 Other pulmonary embolism without acute cor pulmonale: Secondary | ICD-10-CM | POA: Diagnosis not present

## 2022-04-03 DIAGNOSIS — F40240 Claustrophobia: Secondary | ICD-10-CM | POA: Diagnosis not present

## 2022-04-03 DIAGNOSIS — C3402 Malignant neoplasm of left main bronchus: Secondary | ICD-10-CM | POA: Diagnosis not present

## 2022-04-03 DIAGNOSIS — E559 Vitamin D deficiency, unspecified: Secondary | ICD-10-CM | POA: Diagnosis not present

## 2022-04-03 DIAGNOSIS — K589 Irritable bowel syndrome without diarrhea: Secondary | ICD-10-CM | POA: Diagnosis not present

## 2022-04-03 NOTE — Telephone Encounter (Signed)
Copied from [place] [phone]. Topic: General - Other ?>> Apr 03, 2022  3:30 PM Oneta Rack wrote: ?Physical therapist calling requesting verbal orders for social eval, and PT for 2x 2 and 1x 6 ?

## 2022-04-06 ENCOUNTER — Ambulatory Visit: Payer: Medicare Other | Admitting: Radiation Oncology

## 2022-04-06 NOTE — Telephone Encounter (Signed)
Gave verbal orders over the phone. 

## 2022-04-09 ENCOUNTER — Telehealth: Payer: Self-pay | Admitting: Internal Medicine

## 2022-04-09 DIAGNOSIS — I252 Old myocardial infarction: Secondary | ICD-10-CM | POA: Diagnosis not present

## 2022-04-09 DIAGNOSIS — E876 Hypokalemia: Secondary | ICD-10-CM | POA: Diagnosis not present

## 2022-04-09 DIAGNOSIS — I25118 Atherosclerotic heart disease of native coronary artery with other forms of angina pectoris: Secondary | ICD-10-CM | POA: Diagnosis not present

## 2022-04-09 DIAGNOSIS — I119 Hypertensive heart disease without heart failure: Secondary | ICD-10-CM | POA: Diagnosis not present

## 2022-04-09 DIAGNOSIS — I959 Hypotension, unspecified: Secondary | ICD-10-CM | POA: Diagnosis not present

## 2022-04-09 DIAGNOSIS — R7881 Bacteremia: Secondary | ICD-10-CM | POA: Diagnosis not present

## 2022-04-09 NOTE — Telephone Encounter (Signed)
Copied from [place] [id number]. Topic: Quick Communication - Home Health Verbal Orders ?>> Apr 09, 2022  2:24 PM Tessa Lerner A wrote: ?Caller/Agency: Colletta Maryland / [facility]  ?Callback Number: [phone] ?Requesting OT/PT/Skilled Nursing/Social Work/Speech Therapy: OT  ?Frequency: 1w1 2w3 ? ?Colletta Maryland would like a call back when the ok is given ?

## 2022-04-09 NOTE — Telephone Encounter (Signed)
Called Stephanie left VM giving verbal orders. Name was stated on VM. ? ? ?KP ?

## 2022-04-10 ENCOUNTER — Telehealth: Payer: Self-pay | Admitting: Internal Medicine

## 2022-04-10 DIAGNOSIS — R7881 Bacteremia: Secondary | ICD-10-CM | POA: Diagnosis not present

## 2022-04-10 DIAGNOSIS — I252 Old myocardial infarction: Secondary | ICD-10-CM | POA: Diagnosis not present

## 2022-04-10 DIAGNOSIS — E876 Hypokalemia: Secondary | ICD-10-CM | POA: Diagnosis not present

## 2022-04-10 DIAGNOSIS — I119 Hypertensive heart disease without heart failure: Secondary | ICD-10-CM | POA: Diagnosis not present

## 2022-04-10 DIAGNOSIS — I959 Hypotension, unspecified: Secondary | ICD-10-CM | POA: Diagnosis not present

## 2022-04-10 DIAGNOSIS — I25118 Atherosclerotic heart disease of native coronary artery with other forms of angina pectoris: Secondary | ICD-10-CM | POA: Diagnosis not present

## 2022-04-10 NOTE — Telephone Encounter (Signed)
Copied from [place] [id number]. Topic: Quick Communication - Home Health Verbal Orders ?>> Apr 10, 2022  4:01 PM Tessa Lerner A wrote: ?Chris with [facility] would like to know if the patient should continue takingmidodrine (PROAMATINE) 5 MG tablet [[id number]]  and if so Gerald Stabs would like an order submitted for the medication  ? ?Please contact further when possible ?

## 2022-04-10 NOTE — Telephone Encounter (Signed)
Noted  KP 

## 2022-04-10 NOTE — Telephone Encounter (Signed)
Called pt to tell her that the medication she (her [place]) was asking about is listed on her discharge summary as a new medication to take. She also asked about if she was supposed to take cholesterol medication. She has Atorvastatin ordered. Pt verbalized understanding. ?

## 2022-04-10 NOTE — Telephone Encounter (Signed)
Kristi [name], the Education officer, museum with ?[facility]. ?made a visit to the home today and no further orders for social work are needed. ?Needs were met. ? ?Cb [phone]  option 2 ?

## 2022-04-13 ENCOUNTER — Telehealth: Payer: Self-pay

## 2022-04-13 NOTE — Telephone Encounter (Signed)
Noted  KP 

## 2022-04-13 NOTE — Telephone Encounter (Signed)
patient recently discharged from hospital and missed tx appt. Please schedule patient for Lab/MD/ Tecentriq within 1 -2 weeks and inform pt of appt.  ?

## 2022-04-14 ENCOUNTER — Encounter: Payer: Self-pay | Admitting: Oncology

## 2022-04-14 DIAGNOSIS — I252 Old myocardial infarction: Secondary | ICD-10-CM | POA: Diagnosis not present

## 2022-04-14 DIAGNOSIS — I119 Hypertensive heart disease without heart failure: Secondary | ICD-10-CM | POA: Diagnosis not present

## 2022-04-14 DIAGNOSIS — I959 Hypotension, unspecified: Secondary | ICD-10-CM | POA: Diagnosis not present

## 2022-04-14 DIAGNOSIS — I25118 Atherosclerotic heart disease of native coronary artery with other forms of angina pectoris: Secondary | ICD-10-CM | POA: Diagnosis not present

## 2022-04-14 DIAGNOSIS — E876 Hypokalemia: Secondary | ICD-10-CM | POA: Diagnosis not present

## 2022-04-14 DIAGNOSIS — R7881 Bacteremia: Secondary | ICD-10-CM | POA: Diagnosis not present

## 2022-04-15 DIAGNOSIS — I25118 Atherosclerotic heart disease of native coronary artery with other forms of angina pectoris: Secondary | ICD-10-CM | POA: Diagnosis not present

## 2022-04-15 DIAGNOSIS — I959 Hypotension, unspecified: Secondary | ICD-10-CM | POA: Diagnosis not present

## 2022-04-15 DIAGNOSIS — R7881 Bacteremia: Secondary | ICD-10-CM | POA: Diagnosis not present

## 2022-04-15 DIAGNOSIS — E876 Hypokalemia: Secondary | ICD-10-CM | POA: Diagnosis not present

## 2022-04-15 DIAGNOSIS — I252 Old myocardial infarction: Secondary | ICD-10-CM | POA: Diagnosis not present

## 2022-04-15 DIAGNOSIS — I119 Hypertensive heart disease without heart failure: Secondary | ICD-10-CM | POA: Diagnosis not present

## 2022-04-16 DIAGNOSIS — I959 Hypotension, unspecified: Secondary | ICD-10-CM | POA: Diagnosis not present

## 2022-04-16 DIAGNOSIS — R7881 Bacteremia: Secondary | ICD-10-CM | POA: Diagnosis not present

## 2022-04-16 DIAGNOSIS — I25118 Atherosclerotic heart disease of native coronary artery with other forms of angina pectoris: Secondary | ICD-10-CM | POA: Diagnosis not present

## 2022-04-16 DIAGNOSIS — E876 Hypokalemia: Secondary | ICD-10-CM | POA: Diagnosis not present

## 2022-04-16 DIAGNOSIS — I119 Hypertensive heart disease without heart failure: Secondary | ICD-10-CM | POA: Diagnosis not present

## 2022-04-16 DIAGNOSIS — I252 Old myocardial infarction: Secondary | ICD-10-CM | POA: Diagnosis not present

## 2022-04-17 DIAGNOSIS — I252 Old myocardial infarction: Secondary | ICD-10-CM | POA: Diagnosis not present

## 2022-04-17 DIAGNOSIS — E876 Hypokalemia: Secondary | ICD-10-CM | POA: Diagnosis not present

## 2022-04-17 DIAGNOSIS — I25118 Atherosclerotic heart disease of native coronary artery with other forms of angina pectoris: Secondary | ICD-10-CM | POA: Diagnosis not present

## 2022-04-17 DIAGNOSIS — R7881 Bacteremia: Secondary | ICD-10-CM | POA: Diagnosis not present

## 2022-04-17 DIAGNOSIS — I119 Hypertensive heart disease without heart failure: Secondary | ICD-10-CM | POA: Diagnosis not present

## 2022-04-17 DIAGNOSIS — I959 Hypotension, unspecified: Secondary | ICD-10-CM | POA: Diagnosis not present

## 2022-04-20 ENCOUNTER — Telehealth: Payer: Self-pay | Admitting: *Deleted

## 2022-04-20 NOTE — Telephone Encounter (Signed)
Patient husband called wanting to schedule patient for appointment for treatment. I informed him that she is already scheduled for 04/27/22 and she states he was unaware of this and wrote down date and time and said she will be there ?

## 2022-04-21 DIAGNOSIS — E876 Hypokalemia: Secondary | ICD-10-CM | POA: Diagnosis not present

## 2022-04-21 DIAGNOSIS — I25118 Atherosclerotic heart disease of native coronary artery with other forms of angina pectoris: Secondary | ICD-10-CM | POA: Diagnosis not present

## 2022-04-21 DIAGNOSIS — I252 Old myocardial infarction: Secondary | ICD-10-CM | POA: Diagnosis not present

## 2022-04-21 DIAGNOSIS — R7881 Bacteremia: Secondary | ICD-10-CM | POA: Diagnosis not present

## 2022-04-21 DIAGNOSIS — I959 Hypotension, unspecified: Secondary | ICD-10-CM | POA: Diagnosis not present

## 2022-04-21 DIAGNOSIS — I119 Hypertensive heart disease without heart failure: Secondary | ICD-10-CM | POA: Diagnosis not present

## 2022-04-22 DIAGNOSIS — I119 Hypertensive heart disease without heart failure: Secondary | ICD-10-CM | POA: Diagnosis not present

## 2022-04-22 DIAGNOSIS — R7881 Bacteremia: Secondary | ICD-10-CM | POA: Diagnosis not present

## 2022-04-22 DIAGNOSIS — I959 Hypotension, unspecified: Secondary | ICD-10-CM | POA: Diagnosis not present

## 2022-04-22 DIAGNOSIS — I252 Old myocardial infarction: Secondary | ICD-10-CM | POA: Diagnosis not present

## 2022-04-22 DIAGNOSIS — I25118 Atherosclerotic heart disease of native coronary artery with other forms of angina pectoris: Secondary | ICD-10-CM | POA: Diagnosis not present

## 2022-04-22 DIAGNOSIS — E876 Hypokalemia: Secondary | ICD-10-CM | POA: Diagnosis not present

## 2022-04-27 ENCOUNTER — Inpatient Hospital Stay: Payer: Medicare Other

## 2022-04-27 ENCOUNTER — Inpatient Hospital Stay: Payer: Medicare Other | Attending: Oncology

## 2022-04-27 ENCOUNTER — Encounter: Payer: Self-pay | Admitting: Oncology

## 2022-04-27 ENCOUNTER — Other Ambulatory Visit (HOSPITAL_COMMUNITY): Payer: Self-pay

## 2022-04-27 ENCOUNTER — Inpatient Hospital Stay (HOSPITAL_BASED_OUTPATIENT_CLINIC_OR_DEPARTMENT_OTHER): Payer: Medicare Other | Admitting: Oncology

## 2022-04-27 VITALS — BP 99/72 | HR 90 | Temp 97.1°F | Resp 18 | Wt 136.2 lb

## 2022-04-27 DIAGNOSIS — Z5112 Encounter for antineoplastic immunotherapy: Secondary | ICD-10-CM | POA: Insufficient documentation

## 2022-04-27 DIAGNOSIS — Z79899 Other long term (current) drug therapy: Secondary | ICD-10-CM | POA: Diagnosis not present

## 2022-04-27 DIAGNOSIS — R7401 Elevation of levels of liver transaminase levels: Secondary | ICD-10-CM

## 2022-04-27 DIAGNOSIS — Z8589 Personal history of malignant neoplasm of other organs and systems: Secondary | ICD-10-CM | POA: Diagnosis not present

## 2022-04-27 DIAGNOSIS — C3490 Malignant neoplasm of unspecified part of unspecified bronchus or lung: Secondary | ICD-10-CM

## 2022-04-27 DIAGNOSIS — I2699 Other pulmonary embolism without acute cor pulmonale: Secondary | ICD-10-CM | POA: Diagnosis not present

## 2022-04-27 DIAGNOSIS — Z87891 Personal history of nicotine dependence: Secondary | ICD-10-CM | POA: Diagnosis not present

## 2022-04-27 DIAGNOSIS — I639 Cerebral infarction, unspecified: Secondary | ICD-10-CM

## 2022-04-27 DIAGNOSIS — E860 Dehydration: Secondary | ICD-10-CM

## 2022-04-27 DIAGNOSIS — R5383 Other fatigue: Secondary | ICD-10-CM

## 2022-04-27 LAB — COMPREHENSIVE METABOLIC PANEL
ALT: 30 U/L (ref 0–44)
AST: 45 U/L — ABNORMAL HIGH (ref 15–41)
Albumin: 3.5 g/dL (ref 3.5–5.0)
Alkaline Phosphatase: 85 U/L (ref 38–126)
Anion gap: 11 (ref 5–15)
BUN: 13 mg/dL (ref 8–23)
CO2: 23 mmol/L (ref 22–32)
Calcium: 9.3 mg/dL (ref 8.9–10.3)
Chloride: 103 mmol/L (ref 98–111)
Creatinine, Ser: 0.92 mg/dL (ref 0.44–1.00)
GFR, Estimated: >60 mL/min (ref >60)
Glucose, Bld: 101 mg/dL — ABNORMAL HIGH (ref 70–99)
Potassium: 3.2 mmol/L — ABNORMAL LOW (ref 3.5–5.1)
Sodium: 137 mmol/L (ref 135–145)
Total Bilirubin: 0.8 mg/dL (ref 0.3–1.2)
Total Protein: 7.5 g/dL (ref 6.5–8.1)

## 2022-04-27 LAB — CBC WITH DIFFERENTIAL/PLATELET
Abs Immature Granulocytes: 0.02 10*3/uL (ref 0.00–0.07)
Basophils Absolute: 0.1 10*3/uL (ref 0.0–0.1)
Basophils Relative: 1 %
Eosinophils Absolute: 0.4 10*3/uL (ref 0.0–0.5)
Eosinophils Relative: 7 %
HCT: 33.4 % — ABNORMAL LOW (ref 36.0–46.0)
Hemoglobin: 11.1 g/dL — ABNORMAL LOW (ref 12.0–15.0)
Immature Granulocytes: 0 %
Lymphocytes Relative: 20 %
Lymphs Abs: 1.1 10*3/uL (ref 0.7–4.0)
MCH: 29 pg (ref 26.0–34.0)
MCHC: 33.2 g/dL (ref 30.0–36.0)
MCV: 87.2 fL (ref 80.0–100.0)
Monocytes Absolute: 0.7 10*3/uL (ref 0.1–1.0)
Monocytes Relative: 13 %
Neutro Abs: 3.2 10*3/uL (ref 1.7–7.7)
Neutrophils Relative %: 59 %
Platelets: 195 10*3/uL (ref 150–400)
RBC: 3.83 MIL/uL — ABNORMAL LOW (ref 3.87–5.11)
RDW: 13.6 % (ref 11.5–15.5)
WBC: 5.4 10*3/uL (ref 4.0–10.5)
nRBC: 0 % (ref 0.0–0.2)

## 2022-04-27 LAB — TSH: TSH: 0.593 u[IU]/mL (ref 0.350–4.500)

## 2022-04-27 LAB — T4, FREE: Free T4: 1.18 ng/dL — ABNORMAL HIGH (ref 0.61–1.12)

## 2022-04-27 MED ORDER — SODIUM CHLORIDE 0.9 % IV SOLN
1200.0000 mg | Freq: Once | INTRAVENOUS | Status: AC
  Administered 2022-04-27: 1200 mg via INTRAVENOUS
  Filled 2022-04-27: qty 20

## 2022-04-27 MED ORDER — APIXABAN (ELIQUIS) VTE STARTER PACK (10MG AND 5MG): ORAL_TABLET | ORAL | 0 refills | Status: DC

## 2022-04-27 MED ORDER — SODIUM CHLORIDE 0.9% FLUSH
10.0000 mL | INTRAVENOUS | Status: DC | PRN
  Administered 2022-04-27: 10 mL via INTRAVENOUS
  Filled 2022-04-27: qty 10

## 2022-04-27 MED ORDER — ACETAMINOPHEN 325 MG PO TABS
650.0000 mg | ORAL_TABLET | Freq: Once | ORAL | Status: AC
  Administered 2022-04-27: 650 mg via ORAL
  Filled 2022-04-27: qty 2

## 2022-04-27 MED ORDER — SODIUM CHLORIDE 0.9 % IV SOLN
Freq: Once | INTRAVENOUS | Status: AC
  Filled 2022-04-27: qty 250

## 2022-04-27 MED ORDER — ZOLEDRONIC ACID 4 MG/100ML IV SOLN
4.0000 mg | Freq: Once | INTRAVENOUS | Status: AC
  Administered 2022-04-27: 4 mg via INTRAVENOUS
  Filled 2022-04-27: qty 100

## 2022-04-27 MED ORDER — HEPARIN SOD (PORK) LOCK FLUSH 100 UNIT/ML IV SOLN
500.0000 [IU] | Freq: Once | INTRAVENOUS | Status: AC
  Administered 2022-04-27: 500 [IU] via INTRAVENOUS
  Filled 2022-04-27: qty 5

## 2022-04-27 NOTE — Progress Notes (Signed)
Patient tolerated atezolizumab infusion well. Zometa added on also given. No questions/concerns voiced. Patient stable at discharge. AVS given.   ?

## 2022-04-27 NOTE — Patient Instructions (Signed)
[facility] AT [place]  Discharge Instructions: ?Thank you for choosing [facility] to provide your oncology and hematology care.  ?If you have a lab appointment with the [facility], please go directly to the [facility] and check in at the registration area. ? ?Wear comfortable clothing and clothing appropriate for easy access to any Portacath or PICC line.  ? ?We strive to give you quality time with your provider. You may need to reschedule your appointment if you arrive late (15 or more minutes).  Arriving late affects you and other patients whose appointments are after yours.  Also, if you miss three or more appointments without notifying the office, you may be dismissed from the clinic at the provider?s discretion.    ?  ?For prescription refill requests, have your pharmacy contact our office and allow 72 hours for refills to be completed.   ? ?Today you received the following chemotherapy and/or immunotherapy agents:atezolizumab ?  ?To help prevent nausea and vomiting after your treatment, we encourage you to take your nausea medication as directed. ? ?BELOW ARE SYMPTOMS THAT SHOULD BE REPORTED IMMEDIATELY: ?*FEVER GREATER THAN 100.4 F (38 ?C) OR HIGHER ?*CHILLS OR SWEATING ?*NAUSEA AND VOMITING THAT IS NOT CONTROLLED WITH YOUR NAUSEA MEDICATION ?*UNUSUAL SHORTNESS OF BREATH ?*UNUSUAL BRUISING OR BLEEDING ?*URINARY PROBLEMS (pain or burning when urinating, or frequent urination) ?*BOWEL PROBLEMS (unusual diarrhea, constipation, pain near the anus) ?TENDERNESS IN MOUTH AND THROAT WITH OR WITHOUT PRESENCE OF ULCERS (sore throat, sores in mouth, or a toothache) ?UNUSUAL RASH, SWELLING OR PAIN  ?UNUSUAL VAGINAL DISCHARGE OR ITCHING  ? ?Items with * indicate a potential emergency and should be followed up as soon as possible or go to the Emergency Department if any problems should occur. ? ?Please show the CHEMOTHERAPY ALERT CARD or IMMUNOTHERAPY ALERT CARD at check-in to  the Emergency Department and triage nurse. ? ?Should you have questions after your visit or need to cancel or reschedule your appointment, please contact [facility] [place] AT [place]  [phone] and follow the prompts.  Office hours are 8:00 a.m. to 4:30 p.m. Monday - Friday. Please note that voicemails left after 4:00 p.m. may not be returned until the following business day.  We are closed weekends and major holidays. You have access to a nurse at all times for urgent questions. Please call the main number to the clinic [phone] and follow the prompts. ? ?For any non-urgent questions, you may also contact your provider using MyChart. We now offer e-Visits for anyone 43 and older to request care online for non-urgent symptoms. For details visit [email or web address]. ?  ?Also download the MyChart app! Go to the app store, search "MyChart", open the app, select [facility], and log in with your MyChart username and password. ? ?Due to Covid, a mask is required upon entering the hospital/clinic. If you do not have a mask, one will be given to you upon arrival. For doctor visits, patients may have 1 support person aged 55 or older with them. For treatment visits, patients cannot have anyone with them due to current Covid guidelines and our immunocompromised population.  ?

## 2022-04-27 NOTE — Progress Notes (Signed)
Hematology/Oncology Progress note [phone] [phone]      Patient Care Team: Reubin Milan, MD as PCP - General (Internal Medicine) Rinaldo Cloud, MD as Consulting Physician (Cardiology) Glory Buff, RN as Registered Nurse Carmina Miller, MD as Radiation Oncologist (Radiation Oncology)   REASON FOR VISIT:  Follow-up for small cell lung cancer,   HISTORY OF PRESENTING ILLNESS:  Kristi [name] is a  76 y.o.  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  10/20/2018 CT chest with contrast showed large mediastinal mass involving both hilar, left greater than right, consistent with lung carcinoma, The mass causes narrowing of the left mainstem bronchus with resultant volume loss on the left and a mediastinal shift to the left.  Moderate size left pleural effusion. 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  # Initial MRI brain negative.  # Nov 2019- Jan 2020 s/p 4 cycles of Carbo/Etoposide/Tecentriq #  01/24/2019 interim CT scan done which showed continued positive response to therapy with continued reduction in mediastinal adenopathy.  No residual measurable left lung mass. CT findings of acute emphysematous cystitis. Urology did not feel that patient has pyelonephritis and recommend treatment with antibiotics because patient's immunocompromise. Patient finished treatment.   # 11/02/2018-01/09/2019 Chemotherapy carboplatin + Etoposide + tecentriq # Consolidation chest radiation and whole brain radiation finished in May 2020 # 04/25/2019 resume Tecentriq every 3 weeks. # Patient had a fall from her stairs on 01/19/2020. In the emergency room she had CT chest abdomen pelvis CT, CT head without contrast, CT cervical spine without contrast. No CT evidence for acute intracranial abnormality, degenerative changes of cervical spine..   No CT evidence of acute thoracic abdomen injury.  Severe compression fracture of T7, subacute.  # kyphoplasty procedure on 03/01/2020.  T7 biopsy negative for cancer.  11/20/2020 Surveillance CT scan  Reduced size and prominence of right lateral lung base subpleural nodular density. No findings of active malignancy.  MRI brain showed stable post treatment brain. No metastatic disease.   # 10/29/2021 MRI brain w and wo contrast showed two definite new enhancing lesions in cerebellum, with associated restricted diffusion and increased T2 signal, concerning for new foci of metastatic disease. 2. A third lesion in the right cerebellar hemisphere was likely present on the prior exam but continues to show limited contrast enhancement on SPGR; this lesion is associated with diffusion restriction and increased T2 signal, but, on coronal and sagittal T1 sequences, it correlates with an area affected by pulsation artifact. This is also felt to represent a new focus of metastatic disease.  11/20/2021 PET scan - no FDG avid disease in neck chest abdomen or pelvis.  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 No enhancing metastatic disease seen today, chronic small vessel ischemia.   12/19/2021 was seen by neurology Dr.Vaslow. increase ASA to 325mg  daily. Atorvastatin.  40 mg  INTERVAL HISTORY Kristi [name] is a 76 y.o. female who has above history reviewed by me today presents for evaluation prior to immunotherapy for treatment of extensive small cell lung cancer, post hospitalization follow-up.  03/13/2022 - 03/18/2022, patient was hospitalized due to lower extremity weakness CT head did not show any abnormality.  MRI brain confirmed no evidence of new CVA.  Patient was seen by neurologist and weakness was felt to be multifactorial.  CT chest angiogram protocol showed bilateral pulmonary emboli,  patient was started on heparin drip and transition to Xarelto at discharge. For 03/25/2022-4 /10/2022, patient was readmitted due to positive blood culture which was done during her previous admission.  Blood culture was positive for Streptococcus species in both bottles.  Patient had soft blood pressures initially and was given supportive care and IV antibiotics.  She completed antibiotic course.  At discharge, she was recommended to continue anticoagulation.  Both patient and husband are not aware about diagnosis of pulmonary embolism and not aware about new prescription of Xarelto. Xarelto appeared to be prescribed and prescription was printed out.  Patient and husband both not aware about the new prescription.  Patient has been off anticoagulation presumably since 03/31/2022.  Occasionally she has shortness of breath.  No chest pain.  She reports feeling 100% better today.      Review of Systems  Constitutional:  Positive for fatigue. Negative for appetite change, chills and fever.  HENT:   Negative for hearing loss and voice change.   Eyes:  Negative for eye problems.  Respiratory:  Positive for shortness of breath. Negative for chest tightness and cough.   Cardiovascular:  Negative for chest pain.  Gastrointestinal:  Negative for abdominal distention, abdominal pain, blood in stool, diarrhea and nausea.  Endocrine: Negative for hot flashes.  Genitourinary:  Negative for difficulty urinating and frequency.   Musculoskeletal:  Negative for arthralgias and back pain.       Lower extremity weakness  Skin:  Negative for itching and rash.  Neurological:  Negative for extremity weakness, headaches and light-headedness.  Hematological:  Negative for adenopathy.  Psychiatric/Behavioral:  Negative for confusion and depression. The patient is not nervous/anxious.        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HCC) 09/30/2014   1  stent   Claustrophobia    Coronary artery disease    GERD (gastroesophageal reflux disease)    Hypercholesteremia    MI, old    Pneumonia    Small cell lung cancer (HCC)    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 (HCC) 10/27/2018    SURGICAL HISTORY: Past Surgical History:  Procedure Laterality Date   APPENDECTOMY     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   BLADDER NECK SUSPENSION     CHOLECYSTECTOMY N/A 08/14/2018   Procedure: LAPAROSCOPIC CHOLECYSTECTOMY;  Surgeon: Leafy Ro, MD;  Location: [facility];  Service: General;  Laterality: N/A;   CHOLECYSTECTOMY  11/2018   CORONARY ANGIOPLASTY  09/29/2014   CORONARY STENT PLACEMENT     ENDOBRONCHIAL ULTRASOUND N/A 10/21/2018   Procedure: ENDOBRONCHIAL ULTRASOUND;  Surgeon: Shane Crutch, MD;  Location: [facility];  Service: Pulmonary;  Laterality: N/A;   HERNIA REPAIR  2012   IR FLUORO GUIDE CV LINE RIGHT  12/19/2018   KYPHOPLASTY N/A 03/01/2020   Procedure: T7 KYPHOPLASTY;  Surgeon: Kennedy Bucker, MD;  Location: [facility];  Service: Orthopedics;  Laterality: N/A;   LAPAROTOMY     LEFT HEART CATHETERIZATION WITH CORONARY ANGIOGRAM N/A 09/29/2014   Procedure: LEFT HEART CATHETERIZATION WITH CORONARY ANGIOGRAM;  Surgeon: Robynn Pane, MD;  Location: [facility];  Service: Cardiovascular;  Laterality: N/A;   PORTA CATH INSERTION N/A 01/02/2019   Procedure: PORTA CATH INSERTION;  Surgeon: Annice Needy, MD;  Location: [facility];  Service: Cardiovascular;  Laterality: N/A;   PORTACATH PLACEMENT Right 10/28/2018   Procedure:  INSERTION PORT-A-CATH;  Surgeon: Leafy Ro, MD;  Location: [facility];  Service: General;  Laterality: Right;   TEE WITHOUT CARDIOVERSION Right 03/30/2022   Procedure: TRANSESOPHAGEAL ECHOCARDIOGRAM (TEE);  Surgeon: Laurier Nancy, MD;  Location: [facility];  Service: Cardiovascular;  Laterality: Right;   XI ROBOTIC ASSISTED VENTRAL HERNIA N/A 02/27/2021   Procedure: XI ROBOTIC ASSISTED VENTRAL HERNIA (incisional) WITH  MESH;  Surgeon: Henrene Dodge, MD;  Location: [facility];  Service: General;  Laterality: N/A;    SOCIAL HISTORY: Social History   Socioeconomic History   Marital status: Married    Spouse name: [name]    Number of children: 3   Years of education: Not on file   Highest education level: Not on file  Occupational History   Occupation: Retired    Comment: Magazine features editor   Tobacco Use   Smoking status: Former    Packs/day: 1.00    Years: 39.00    Pack years: 39.00    Types: Cigarettes    Start date: 12/21/1978    Quit date: 10/16/2018    Years since quitting: 3.5   Smokeless tobacco: Never  Vaping Use   Vaping Use: Never used  Substance and Sexual Activity   Alcohol use: Yes    Alcohol/week: 0.0 standard drinks    Comment: occassional - approx 1 every 2 weeks    Drug use: No   Sexual activity: Yes    Birth control/protection: None  Other Topics Concern   Not on file  Social History Narrative   Not on file   Social Determinants of Health   Financial Resource Strain: Not on file  Food Insecurity: Not on file  Transportation Needs: Not on file  Physical Activity: Not on file  Stress: Not on file  Social Connections: Not on file  Intimate Partner Violence: Not on file    FAMILY HISTORY: Family History  Problem Relation Age of Onset   Alzheimer's disease Mother    Colon cancer Father    Breast cancer Neg Hx     ALLERGIES:  has No Known Allergies.  MEDICATIONS:  Current Outpatient Medications  Medication Sig Dispense Refill   acetaminophen (TYLENOL) 500 MG tablet Take 2 tablets (1,000 mg total) by mouth every 6 (six) hours as needed for mild pain or headache.     alum & mag hydroxide-simeth (MAALOX/MYLANTA) 200-200-20 MG/5ML suspension Take 30 mLs by mouth every 6 (six) hours as needed for indigestion or heartburn. 355 mL 0   APIXABAN (ELIQUIS) VTE STARTER PACK (10MG  AND 5MG ) Take as directed on package: start with two-5mg  tablets twice daily for 7 days. On day 8,  switch to one-5mg  tablet twice daily. 1 each 0   aspirin 325 MG tablet Take 325 mg by mouth daily.     atorvastatin (LIPITOR) 40 MG tablet Take 1 tablet (40 mg total) by mouth daily. 30 tablet 3   Calcium 600-200 MG-UNIT tablet Take 1 tablet by mouth 2 (two) times daily.     Cholecalciferol (DIALYVITE VITAMIN D 5000) 125 MCG (5000 UT) capsule Take 5,000 Units by mouth daily.     diphenhydrAMINE (BENADRYL) 25 mg capsule Take 1 capsule (25 mg total) by mouth at bedtime as needed (if no sleep even with trazodone). 30 capsule 0   lidocaine-prilocaine (EMLA) cream Apply 1 application topically daily as needed (port access). 30 g 2   Magnesium 250 MG TABS Take 250 mg by mouth daily.     midodrine (PROAMATINE) 5 MG tablet Take 1  tablet (5 mg total) by mouth 3 (three) times daily with meals. 90 tablet 1   pantoprazole (PROTONIX) 40 MG tablet Take 1 tablet (40 mg total) by mouth 2 (two) times daily. (Patient taking differently: Take 40 mg by mouth 2 (two) times daily as needed.) 60 tablet 0   polyethylene glycol (MIRALAX / GLYCOLAX) 17 g packet Take 17 g by mouth daily as needed for mild constipation. 14 each 0   prochlorperazine (COMPAZINE) 10 MG tablet Take 10 mg by mouth every 6 (six) hours as needed for nausea or vomiting.     sertraline (ZOLOFT) 100 MG tablet Take 1 tablet (100 mg total) by mouth daily. 90 tablet 3   sucralfate (CARAFATE) 1 g tablet Take 1 g by mouth 3 (three) times daily as needed (heartburn).     vitamin B-12 (CYANOCOBALAMIN) 1000 MCG tablet Take 1,000 mcg by mouth daily.     lidocaine (LIDODERM) 5 % Place 1 patch onto the skin daily. Remove & Discard patch within 12 hours or as directed by MD (Patient not taking: Reported on 04/27/2022) 30 patch 0   Rivaroxaban (XARELTO) 15 MG TABS tablet Take 1 tablet (15 mg total) by mouth 2 (two) times daily with a meal for 18 days. 36 tablet 0   traZODone (DESYREL) 50 MG tablet Take 1 tablet (50 mg total) by mouth at bedtime as needed for sleep.  (Patient not taking: Reported on 04/27/2022) 30 tablet 2   No current facility-administered medications for this visit.   Facility-Administered Medications Ordered in Other Visits  Medication Dose Route Frequency Provider Last Rate Last Admin   heparin lock flush 100 unit/mL  500 Units Intravenous Once Rickard Patience, MD       heparin lock flush 100 unit/mL  500 Units Intravenous Once Rickard Patience, MD       sodium chloride flush (NS) 0.9 % injection 10 mL  10 mL Intravenous PRN Rickard Patience, MD   10 mL at 01/09/19 0820   sodium chloride flush (NS) 0.9 % injection 10 mL  10 mL Intravenous PRN Rickard Patience, MD   10 mL at 01/10/19 1400     PHYSICAL EXAMINATION: ECOG PERFORMANCE STATUS: 1 - Symptomatic but completely ambulatory Vitals:   04/27/22 1344  BP: 99/72  Pulse: 90  Resp: 18  Temp: (!) 97.1 F (36.2 C)   Filed Weights   04/27/22 1344  Weight: 136 lb 3.2 oz (61.8 kg)   Physical Examination Today's Vitals   04/27/22 1344  BP: 99/72  Pulse: 90  Resp: 18  Temp: (!) 97.1 F (36.2 C)  Weight: 136 lb 3.2 oz (61.8 kg)  PainSc: 0-No pain   Body mass index is 22.66 kg/m.   Physical Exam Constitutional:      General: She is not in acute distress.    Appearance: She is not diaphoretic.     Comments: Patient walks independently  HENT:     Head: Normocephalic and atraumatic.     Nose: Nose normal.     Mouth/Throat:     Pharynx: No oropharyngeal exudate.  Eyes:     General: No scleral icterus.    Pupils: Pupils are equal, round, and reactive to light.  Cardiovascular:     Rate and Rhythm: Normal rate and regular rhythm.     Heart sounds: No murmur heard. Pulmonary:     Effort: Pulmonary effort is normal. No respiratory distress.     Breath sounds: No wheezing or rales.     Comments:  Decreased breath sounds bilaterally.  Abdominal:     General: There is no distension.     Palpations: Abdomen is soft.     Tenderness: There is no abdominal tenderness.  Musculoskeletal:         General: Normal range of motion.     Cervical back: Normal range of motion and neck supple.  Skin:    General: Skin is warm and dry.     Findings: No erythema.  Neurological:     Mental Status: She is alert and oriented to person, place, and time.     Cranial Nerves: No cranial nerve deficit.     Motor: No abnormal muscle tone.     Coordination: Coordination normal.  Psychiatric:        Mood and Affect: Affect normal.       LABORATORY DATA:  I have reviewed the data as listed Lab Results  Component Value Date   WBC 5.4 04/27/2022   HGB 11.1 (L) 04/27/2022   HCT 33.4 (L) 04/27/2022   MCV 87.2 04/27/2022   PLT 195 04/27/2022   Recent Labs    03/24/22 0055 03/24/22 2151 03/26/22 0718 03/28/22 0449 04/27/22 1335  NA 141 140 140 138 137  K 2.8* 3.4* 3.3* 3.6 3.2*  CL 110 109 111 110 103  CO2 24 23 21* 21* 23  GLUCOSE 101* 119* 91 104* 101*  BUN 12 11 7* 8 13  CREATININE 1.07* 1.10* 0.86 0.96 0.92  CALCIUM 8.6* 9.4 8.7* 8.9 9.3  GFRNONAA 54* 52* >60 >60 >60  PROT 6.6 7.3  --   --  7.5  ALBUMIN 3.3* 3.4*  --   --  3.5  AST 78* 75*  --   --  45*  ALT 58* 55*  --   --  30  ALKPHOS 98 97  --   --  85  BILITOT 0.5 0.5  --   --  0.8     RADIOGRAPHIC STUDIES: I have personally reviewed the radiological images as listed and agreed with the findings in the report.  CT ANGIO HEAD NECK W WO CM  Result Date: 03/13/2022 CLINICAL DATA:  Acute neurologic deficit EXAM: CT ANGIOGRAPHY HEAD AND NECK TECHNIQUE: Multidetector CT imaging of the head and neck was performed using the standard protocol during bolus administration of intravenous contrast. Multiplanar CT image reconstructions and MIPs were obtained to evaluate the vascular anatomy. Carotid stenosis measurements (when applicable) are obtained utilizing NASCET criteria, using the distal internal carotid diameter as the denominator. RADIATION DOSE REDUCTION: This exam was performed according to the departmental dose-optimization  program which includes automated exposure control, adjustment of the mA and/or kV according to patient size and/or use of iterative reconstruction technique. CONTRAST:  75mL OMNIPAQUE IOHEXOL 350 MG/ML SOLN COMPARISON:  None. FINDINGS: CTA NECK FINDINGS SKELETON: There is no bony spinal canal stenosis. No lytic or blastic lesion. OTHER NECK: Normal pharynx, larynx and major salivary glands. No cervical lymphadenopathy. Unremarkable thyroid gland. UPPER CHEST: There is a pulmonary embolus within the main right pulmonary artery, extending into the right upper lobar branch. There are small emboli visible in the lingula. AORTIC ARCH: There is calcific atherosclerosis of the aortic arch. There is no aneurysm, dissection or hemodynamically significant stenosis of the visualized portion of the aorta. Conventional 3 vessel aortic branching pattern. 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 RIGHT CAROTID SYSTEM: No dissection, occlusion or aneurysm. There is mixed density atherosclerosis extending into the proximal ICA, resulting in less than  50% stenosis. LEFT CAROTID SYSTEM: No dissection, occlusion or aneurysm. There is calcified atherosclerosis extending into the proximal ICA, resulting in less than 50% stenosis. VERTEBRAL ARTERIES: Right dominant configuration. The left V1 and proximal V2 segments are occluded. The right vertebral artery is normal. There is opacification of the distal left vertebral artery beginning at approximately the C2 level. CTA HEAD FINDINGS POSTERIOR CIRCULATION: --Vertebral arteries: Normal V4 segments. --Inferior cerebellar arteries: Normal. --Basilar artery: Normal. --Superior cerebellar arteries: Normal. --Posterior cerebral arteries (PCA): Normal. ANTERIOR CIRCULATION: --Intracranial internal carotid arteries: Normal. --Anterior cerebral arteries (ACA): Normal. Both A1 segments are present. Patent anterior communicating artery (a-comm). --Middle cerebral arteries (MCA):  Normal. VENOUS SINUSES: As permitted by contrast timing, patent. ANATOMIC VARIANTS: Fetal origins of both posterior cerebral arteries. Review of the MIP images confirms the above findings. IMPRESSION: 1. No intracranial arterial occlusion or high-grade stenosis. 2. 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 3. 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 4. Pulmonary emboli within the main right pulmonary artery, extending into the right upper lobar branch. Small emboli in the lingula. Aortic Atherosclerosis (ICD10-I70.0). Critical Value/emergent results were called by telephone at the time of interpretation on 03/13/2022 at 2:15 am to provider [facility] , who verbally acknowledged these results. Electronically Signed   By: Deatra Robinson M.D.   On: 03/13/2022 02:16   CT Head Wo Contrast  Result Date: 03/12/2022 CLINICAL DATA:  Decreased mobility EXAM: CT HEAD WITHOUT CONTRAST TECHNIQUE: Contiguous axial images were obtained from the base of the skull through the vertex without intravenous contrast. RADIATION DOSE REDUCTION: This exam was performed according to the departmental dose-optimization program which includes automated exposure control, adjustment of the mA and/or kV according to patient size and/or use of iterative reconstruction technique. COMPARISON:  MRI 11/21/2021, CT brain 01/19/2020 FINDINGS: Brain: No acute territorial infarction, hemorrhage or intracranial mass. Extensive white matter hypodensity as before corresponding to history of prior brain radiation. 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 Atrophy. Stable ventricle size. Vascular: No hyperdense vessels.  Carotid vascular calcification Skull: Normal. Negative for fracture or focal lesion. Sinuses/Orbits: No acute finding. Other: None IMPRESSION: 1. No definite CT evidence for acute intracranial abnormality 2. 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 Similar appearance of atrophy and extensive white matter hypodensity. Electronically Signed   By: Jasmine Pang M.D.   On: 03/12/2022 21:20   CT Angio Chest Pulmonary Embolism (PE) W or WO Contrast  Addendum Date: 03/13/2022   ADDENDUM REPORT: 03/13/2022 03:38 ADDENDUM: Results discussed over the phone with Dr. Lonn Georgia at 3:20 a.m., 03/13/2022, with acknowledgement of findings. Electronically Signed   By: Almira Bar M.D.   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 EXAM: CT ANGIOGRAPHY CHEST WITH CONTRAST TECHNIQUE: Multidetector CT imaging of the chest was performed using the standard protocol during bolus administration of intravenous contrast. Multiplanar CT image reconstructions and MIPs were obtained to evaluate the vascular anatomy. RADIATION DOSE REDUCTION: This exam was performed according to the departmental dose-optimization program which includes automated exposure control, adjustment of the mA and/or kV according to patient size and/or use of iterative reconstruction technique. CONTRAST:  75mL OMNIPAQUE IOHEXOL 350 MG/ML SOLN COMPARISON:  CT studies of chest, abdomen and pelvis with contrast 10/17/2021 and 06/16/2021. FINDINGS: Cardiovascular: Right chest port with IJ approach catheter,  again terminating about the superior cavoatrial junction. The cardiac size is normal. There is moderate to heavy atherosclerosis in the aorta and great vessels, heavy three-vessel coronary artery atherosclerosis. There is no pericardial effusion. The pulmonary veins are decompressed. There is no aortic aneurysm or dissection. 70% mixed plaque stenosis in the proximal left subclavian artery is unchanged. Although the pulmonary arteries are within normal caliber limits, there is a linear thrombus within the right main pulmonary artery which is  nonoccluding, with occluding thrombus extending from this into the right upper lobe main artery and the posterior and apical right upper lobe segmental arteries, and into the anterior segmental artery with nonoccluding thrombus. There are multiple subsegmental right upper lobe apical and posterior segment arteries showing lack of opacification which is probably due to thrombus but the and vessels are very small difficult to assess at the subsegmental level. There is additional non occluding thrombus in the interlobar pulmonary artery, in the right middle lobe medial and lateral segmental arteries, and in the right lower lobe main and basal segmental and multiple subsegmental arteries. On the left nonoccluding embolus is seen in the lingular main and lateral and medial segmental arteries. The remainder of the left lung arteries free of thrombus. There are no findings of acute right heart strain at this time. Mediastinum/Nodes: No enlarged mediastinal, hilar, or axillary lymph nodes. Thyroid gland, trachea, and esophagus demonstrate no significant findings. There is a small hiatal hernia. There are calcified subcarinal lymph nodes. Lungs/Pleura: The lungs are mildly emphysematous with centrilobular and paraseptal emphysematous changes predominating in the upper lobes. There are increased posterior basal opacities in the right lower lobe which are suspected due to pulmonary infarctions. There is increased band atelectasis in the anterior basal right lower lobe segment. Scattered calcified granulomas again noted in the right. There is no pleural effusion, thickening or pneumothorax. Mild central bronchial thickening continues to be seen in the lower lobes, apparently chronic, without mucous plugging. There is a chronic atelectasis or fibrotic consolidation in the lingular base. The lungs are otherwise generally clear. No noncalcified nodule is seen. There are few linear scar-like opacities in both bases. Upper Abdomen:  No acute abnormality. Musculoskeletal: Chronic severe T7 and moderate T4 compression fractures are redemonstrated, with kyphoplasty cement at T7. There is osteopenia and degenerative change, thoracic kyphodextroscoliosis without new abnormality. There is no destructive osseous or lytic lesion. there is a chronic sternal fracture of the Superior body of the sternum. Review of the MIP images confirms the above findings. IMPRESSION: 1. Bilateral arterial emboli right-greater-than-left with overall moderate clot burden. No findings of acute right heart strain at this time. 2. Opacities in the posterior basal right lower lobe most likely due to evolving infarctions. 3. COPD.  No suspicious nodule.  Old granulomatous calcifications. 4. Aortic, great vessel and coronary artery atherosclerosis. No aneurysm or dissection. 5. 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 Electronically Signed: By: Almira Bar M.D. On: 03/13/2022 03:17   MR BRAIN W WO CONTRAST  Result Date: 03/13/2022 CLINICAL DATA:  Acute neurologic deficit EXAM: MRI HEAD WITHOUT AND WITH CONTRAST TECHNIQUE: Multiplanar, multiecho pulse sequences of the brain and surrounding structures were obtained without and with intravenous contrast. CONTRAST:  5mL GADAVIST GADOBUTROL 1 MMOL/ML IV SOLN COMPARISON:  None. FINDINGS: Brain: No acute infarct, mass effect or extra-axial collection. No acute or chronic hemorrhage. Confluent hyperintense T2-weighted white matter signal. Generalized volume loss without a clear lobar predilection. Old bilateral cerebellar infarcts. The midline structures are  normal. There is no abnormal contrast enhancement. Vascular: Major flow voids are preserved. Skull and upper cervical spine: Normal calvarium and skull base. Visualized upper cervical spine and soft tissues are normal. Sinuses/Orbits:No paranasal sinus fluid levels or advanced mucosal thickening. No mastoid or middle ear effusion.  Normal orbits. IMPRESSION: 1. No acute intracranial abnormality. 2. 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 Electronically Signed   By: Deatra Robinson M.D.   On: 03/13/2022 02:52   MR CERVICAL SPINE W WO CONTRAST  Result Date: 03/14/2022 CLINICAL DATA:  Ataxia EXAM: MRI CERVICAL SPINE WITHOUT AND WITH CONTRAST TECHNIQUE: Multiplanar and multiecho pulse sequences of the cervical spine, to include the craniocervical junction and cervicothoracic junction, were obtained without and with intravenous contrast. CONTRAST:  7mL GADAVIST GADOBUTROL 1 MMOL/ML IV SOLN COMPARISON:  None. FINDINGS: Alignment: Physiologic. Vertebrae: No fracture, evidence of discitis, or bone lesion. Cord: Normal signal and morphology. Posterior Fossa, vertebral arteries, paraspinal tissues: Negative. Disc levels: C1-2: Unremarkable. C2-3: Normal disc space and facet joints. There is no spinal canal stenosis. No neural foraminal stenosis. C3-4: Normal disc space and facet joints. There is no spinal canal stenosis. No neural foraminal stenosis. C4-5: Disc space narrowing with small disc osteophyte complex. There is no spinal canal stenosis. No neural foraminal stenosis. C5-6: Large right uncovertebral osteophyte superimposed on small disc bulge. There is no spinal canal stenosis. Severe right neural foraminal stenosis. C6-7: Normal disc space and facet joints. There is no spinal canal stenosis. No neural foraminal stenosis. C7-T1: Normal disc space and facet joints. There is no spinal canal stenosis. No neural foraminal stenosis. IMPRESSION: 1. No acute abnormality of the cervical spine. 2. Severe right C5-6 neural foraminal stenosis secondary to large right uncovertebral osteophyte. 3. Mild cervical degenerative disc disease without spinal canal stenosis. Electronically Signed   By: Deatra Robinson M.D.   On: 03/14/2022 23:14   MR THORACIC SPINE W WO CONTRAST  Result Date: 03/27/2022 CLINICAL DATA:   Osteomyelitis, thoracic. Small cell lung carcinoma. Bacteremia. EXAM: MRI THORACIC WITHOUT AND WITH CONTRAST TECHNIQUE: Multiplanar and multiecho pulse sequences of the thoracic spine were obtained without and with intravenous contrast. CONTRAST:  6mL GADAVIST GADOBUTROL 1 MMOL/ML IV SOLN COMPARISON:  None. FINDINGS: Alignment:  Mild left convex asymmetry with apex at T8 Vertebrae: Chronic compression deformity of T7 with 60% height loss. Mild T4 compression deformity without edema. No abnormal contrast enhancement. Cord:  Normal signal and morphology. Paraspinal and other soft tissues: Negative Disc levels: T7-8: Small central disc protrusion without spinal canal stenosis. T8-9: Small central disc protrusion without spinal canal stenosis. The other disc levels are unremarkable. IMPRESSION: 1. No discitis-osteomyelitis or epidural abscess. No thoracic metastatic disease. 2. Chronic T4 and T7 compression deformities. Electronically Signed   By: Deatra Robinson M.D.   On: 03/27/2022 19:35   MR THORACIC SPINE W WO CONTRAST  Result Date: 03/13/2022 CLINICAL DATA:  Acute myelopathy EXAM: MRI THORACIC AND LUMBAR SPINE WITHOUT AND WITH CONTRAST TECHNIQUE: Multiplanar and multiecho pulse sequences of the thoracic and lumbar spine were obtained without and with intravenous contrast. CONTRAST:  7mL GADAVIST GADOBUTROL 1 MMOL/ML IV SOLN COMPARISON:  None. FINDINGS: MRI THORACIC SPINE FINDINGS Alignment:  Physiologic. Vertebrae: There are old compression fractures of T4 and T7, unchanged. No acute fracture. Cord:  Normal signal and morphology. Paraspinal and other soft tissues: Negative. Disc levels: Mild midthoracic degenerative disease without spinal canal stenosis. There is a small central disc protrusion at T7-8 and a small left subarticular disc  protrusion at T8-9 MRI LUMBAR SPINE FINDINGS Segmentation:  Standard. Alignment:  Physiologic. Vertebrae:  No fracture, evidence of discitis, or bone lesion. Conus medullaris:  Extends to the L1 level and appears normal. Paraspinal and other soft tissues: Negative. Disc levels: IMPRESSION: 1. No acute abnormality of the thoracic or lumbar spine. 2. Unchanged mild thoracic degenerative disc disease without spinal canal or neural foraminal stenosis. 3. Unchanged old compression fractures of T4 and T7. Electronically Signed   By: Deatra Robinson M.D.   On: 03/13/2022 22:18   MR LUMBAR SPINE W WO CONTRAST  Result Date: 03/13/2022 CLINICAL DATA:  Acute myelopathy EXAM: MRI THORACIC AND LUMBAR SPINE WITHOUT AND WITH CONTRAST TECHNIQUE: Multiplanar and multiecho pulse sequences of the thoracic and lumbar spine were obtained without and with intravenous contrast. CONTRAST:  7mL GADAVIST GADOBUTROL 1 MMOL/ML IV SOLN COMPARISON:  None. FINDINGS: MRI THORACIC SPINE FINDINGS Alignment:  Physiologic. Vertebrae: There are old compression fractures of T4 and T7, unchanged. No acute fracture. Cord:  Normal signal and morphology. Paraspinal and other soft tissues: Negative. Disc levels: Mild midthoracic degenerative disease without spinal canal stenosis. There is a small central disc protrusion at T7-8 and a small left subarticular disc protrusion at T8-9 MRI LUMBAR SPINE FINDINGS Segmentation:  Standard. Alignment:  Physiologic. Vertebrae:  No fracture, evidence of discitis, or bone lesion. Conus medullaris: Extends to the L1 level and appears normal. Paraspinal and other soft tissues: Negative. Disc levels: IMPRESSION: 1. No acute abnormality of the thoracic or lumbar spine. 2. Unchanged mild thoracic degenerative disc disease without spinal canal or neural foraminal stenosis. 3. Unchanged old compression fractures of T4 and T7. Electronically Signed   By: Deatra Robinson M.D.   On: 03/13/2022 22:18   CT ABDOMEN PELVIS W CONTRAST  Result Date: 03/24/2022 CLINICAL DATA:  Acute nonlocalized abdominal pain. Weakness, fatigue, and hypotension. EXAM: CT ABDOMEN AND PELVIS WITH CONTRAST TECHNIQUE:  Multidetector CT imaging of the abdomen and pelvis was performed using the standard protocol following bolus administration of intravenous contrast. RADIATION DOSE REDUCTION: This exam was performed according to the departmental dose-optimization program which includes automated exposure control, adjustment of the mA and/or kV according to patient size and/or use of iterative reconstruction technique. CONTRAST:  OMNIPAQUE IOHEXOL 300 MG/ML  SOLN COMPARISON:  04/29/2020 FINDINGS: Lower chest: Scarring in the lung bases. Small esophageal hiatal hernia. Hepatobiliary: No focal liver abnormality is seen. Status post cholecystectomy. No biliary dilatation. Pancreas: Unremarkable. No pancreatic ductal dilatation or surrounding inflammatory changes. Spleen: Normal in size without focal abnormality. Adrenals/Urinary Tract: Adrenal glands are unremarkable. 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 Kidneys are otherwise normal, without renal calculi, focal lesion, or hydronephrosis. Bladder is unremarkable. Stomach/Bowel: Stomach, small bowel, and colon are not abnormally distended. Scattered colonic diverticula without evidence of acute diverticulitis. No wall thickening or inflammatory changes. Appendix is not identified. Vascular/Lymphatic: Aortic atherosclerosis. No enlarged abdominal or pelvic lymph nodes. Reproductive: Uterus and bilateral adnexa are unremarkable. Other: No abdominal wall hernia or abnormality. No abdominopelvic ascites. Mesh hernia repair of the anterior abdominal wall. Musculoskeletal: No acute or significant osseous findings. IMPRESSION: 1. No evidence of bowel obstruction or inflammation. 2. Small esophageal hiatal hernia. 3. Aortic atherosclerosis. Electronically Signed   By: Burman Nieves M.D.   On: 03/24/2022 23:25   US Venous Img Lower Bilateral (DVT)  Result Date: 03/14/2022 CLINICAL DATA:  Pulmonary embolism. EXAM: BILATERAL LOWER EXTREMITY VENOUS DOPPLER ULTRASOUND  TECHNIQUE: Gray-scale sonography with graded compression, as well as color Doppler  and duplex ultrasound were performed to evaluate the lower extremity deep venous systems from the level of the common femoral vein and including the common femoral, femoral, profunda femoral, popliteal and calf veins including the posterior tibial, peroneal and gastrocnemius veins when visible. The superficial great saphenous vein was also interrogated. Spectral Doppler was utilized to evaluate flow at rest and with distal augmentation maneuvers in the common femoral, femoral and popliteal veins. COMPARISON:  None. FINDINGS: RIGHT LOWER EXTREMITY Common Femoral Vein: No evidence of thrombus. Normal compressibility, respiratory phasicity and response to augmentation. Saphenofemoral Junction: No evidence of thrombus. Normal compressibility and flow on color Doppler imaging. Profunda Femoral Vein: No evidence of thrombus. Normal compressibility and flow on color Doppler imaging. Femoral Vein: No evidence of thrombus. Normal compressibility, respiratory phasicity and response to augmentation. Popliteal Vein: No evidence of thrombus. Normal compressibility, respiratory phasicity and response to augmentation. Calf Veins: No evidence of thrombus. Normal compressibility and flow on color Doppler imaging. Superficial Great Saphenous Vein: No evidence of thrombus. Normal compressibility. Venous Reflux:  None. Other Findings: No evidence of superficial thrombophlebitis or abnormal fluid collection. LEFT LOWER EXTREMITY Common Femoral Vein: No evidence of thrombus. Normal compressibility, respiratory phasicity and response to augmentation. Saphenofemoral Junction: No evidence of thrombus. Normal compressibility and flow on color Doppler imaging. Profunda Femoral Vein: No evidence of thrombus. Normal compressibility and flow on color Doppler imaging. Femoral Vein: No evidence of thrombus. Normal compressibility, respiratory phasicity and response  to augmentation. Popliteal Vein: No evidence of thrombus. Normal compressibility, respiratory phasicity and response to augmentation. Calf Veins: No evidence of thrombus. Normal compressibility and flow on color Doppler imaging. Superficial Great Saphenous Vein: No evidence of thrombus. Normal compressibility. Venous Reflux:  None. Other Findings: No evidence of superficial thrombophlebitis or abnormal fluid collection. IMPRESSION: No evidence of deep venous thrombosis in either lower extremity. Electronically Signed   By: Irish Lack M.D.   On: 03/14/2022 11:50   DG Chest Port 1 View  Result Date: 03/29/2022 CLINICAL DATA:  Dyspnea EXAM: PORTABLE CHEST 1 VIEW COMPARISON:  03/24/2022 FINDINGS: Rotated AP portable examination. Right chest port catheter. The heart size and mediastinal contours are within normal limits. Both lungs are clear. The visualized skeletal structures are unremarkable. IMPRESSION: Rotated AP portable examination without acute abnormality of the lungs. Electronically Signed   By: Jearld Lesch M.D.   On: 03/29/2022 11:29   DG Chest Portable 1 View  Result Date: 03/24/2022 CLINICAL DATA:  Dyspnea EXAM: PORTABLE CHEST 1 VIEW COMPARISON:  03/24/2022 FINDINGS: Lungs are clear. No pneumothorax or pleural effusion. Right internal jugular chest port tip is seen at the superior cavoatrial junction. Cardiac size within normal limits. Pulmonary vascularity is normal. IMPRESSION: No active disease. Electronically Signed   By: Helyn Numbers M.D.   On: 03/24/2022 23:02   DG Chest Port 1 View  Result Date: 03/24/2022 CLINICAL DATA:  Weakness EXAM: PORTABLE CHEST 1 VIEW COMPARISON:  07/06/2019 FINDINGS: The heart size and mediastinal contours are within normal limits. Bibasilar atelectasis. The visualized skeletal structures are unremarkable. Right chest wall Port-A-Cath tip at the cavoatrial junction. IMPRESSION: Bibasilar atelectasis. Electronically Signed   By: Deatra Robinson M.D.   On:  03/24/2022 00:50   ECHOCARDIOGRAM COMPLETE  Result Date: 03/26/2022    ECHOCARDIOGRAM REPORT   Patient Name:   CHESTINE THERIAULT Reierson Date of Exam: 03/26/2022 Medical Rec #:  [id number]        Height:       65.0 in Accession #:  [id number]       Weight:       145.0 lb Date of Birth:  [birth year]       BSA:          1.725 m Patient Age:    75 years         BP:           116/80 mmHg Patient Gender: F                HR:           72 bpm. Exam Location:  [facility] Procedure: 2D Echo, Color Doppler and Cardiac Doppler Indications:     R78.81 Bacteremia  History:         Patient has prior history of Echocardiogram examinations, most                  recent 01/15/2022. CAD; Risk Factors:HCL.  Sonographer:     Humphrey Rolls Referring Phys:  [id number] Lynn Ito Diagnosing Phys: Julien Nordmann MD  Sonographer Comments: Suboptimal parasternal window. IMPRESSIONS  1. Left ventricular ejection fraction, by estimation, is 60 to 65%. The left ventricle has normal function. The left ventricle has no regional wall motion abnormalities. Left ventricular diastolic parameters are consistent with Grade I diastolic dysfunction (impaired relaxation).  2. Right ventricular systolic function is normal. The right ventricular size is normal.  3. The mitral valve is normal in structure. Mild mitral valve regurgitation. No evidence of mitral stenosis.  4. The aortic valve is normal in structure. There is mild calcification of the aortic valve. Aortic valve regurgitation is not visualized. Aortic valve sclerosis is present, with no evidence of aortic valve stenosis.  5. The inferior vena cava is normal in size with greater than 50% respiratory variability, suggesting right atrial pressure of 3 mmHg.  6. No valve vegetation noted. FINDINGS  Left Ventricle: Left ventricular ejection fraction, by estimation, is 60 to 65%. The left ventricle has normal function. The left ventricle has no regional wall motion abnormalities. The left ventricular internal  cavity size was normal in size. There is  no left ventricular hypertrophy. Left ventricular diastolic parameters are consistent with Grade I diastolic dysfunction (impaired relaxation). Right Ventricle: The right ventricular size is normal. No increase in right ventricular wall thickness. Right ventricular systolic function is normal. Left Atrium: Left atrial size was normal in size. Right Atrium: Right atrial size was normal in size. Pericardium: There is no evidence of pericardial effusion. Mitral Valve: The mitral valve is normal in structure. Mild mitral valve regurgitation. No evidence of mitral valve stenosis. MV peak gradient, 2.7 mmHg. The mean mitral valve gradient is 1.0 mmHg. Tricuspid Valve: The tricuspid valve is normal in structure. Tricuspid valve regurgitation is not demonstrated. No evidence of tricuspid stenosis. Aortic Valve: The aortic valve is normal in structure. There is mild calcification of the aortic valve. Aortic valve regurgitation is not visualized. Aortic valve sclerosis is present, with no evidence of aortic valve stenosis. Aortic valve mean gradient  measures 8.0 mmHg. Aortic valve peak gradient measures 15.7 mmHg. Aortic valve area, by VTI measures 1.92 cm. Pulmonic Valve: The pulmonic valve was normal in structure. Pulmonic valve regurgitation is not visualized. No evidence of pulmonic stenosis. Aorta: The aortic root is normal in size and structure. Venous: The inferior vena cava is normal in size with greater than 50% respiratory variability, suggesting right atrial pressure of 3 mmHg. IAS/Shunts: No atrial level shunt detected by color flow Doppler.  LEFT  VENTRICLE PLAX 2D LVIDd:         3.79 cm   Diastology LVIDs:         2.25 cm   LV e' medial:    6.53 cm/s LV PW:         0.65 cm   LV E/e' medial:  11.5 LV IVS:        0.57 cm   LV e' lateral:   8.05 cm/s LVOT diam:     2.30 cm   LV E/e' lateral: 9.3 LV SV:         78 LV SV Index:   45 LVOT Area:     4.15 cm  RIGHT VENTRICLE  RV Basal diam:  2.55 cm LEFT ATRIUM             Index LA diam:        3.20 cm 1.85 cm/m LA Vol (A2C):   32.9 ml 19.07 ml/m LA Vol (A4C):   20.7 ml 12.00 ml/m LA Biplane Vol: 26.9 ml 15.59 ml/m  AORTIC VALVE                     PULMONIC VALVE AV Area (Vmax):    1.93 cm      PV Vmax:       0.82 m/s AV Area (Vmean):   1.70 cm      PV Vmean:      62.600 cm/s AV Area (VTI):     1.92 cm      PV VTI:        0.178 m AV Vmax:           198.00 cm/s   PV Peak grad:  2.7 mmHg AV Vmean:          137.000 cm/s  PV Mean grad:  2.0 mmHg AV VTI:            0.405 m AV Peak Grad:      15.7 mmHg AV Mean Grad:      8.0 mmHg LVOT Vmax:         92.00 cm/s LVOT Vmean:        55.900 cm/s LVOT VTI:          0.187 m LVOT/AV VTI ratio: 0.46  AORTA Ao Root diam: 3.20 cm MITRAL VALVE MV Area (PHT): 3.54 cm    SHUNTS MV Area VTI:   3.35 cm    Systemic VTI:  0.19 m MV Peak grad:  2.7 mmHg    Systemic Diam: 2.30 cm MV Mean grad:  1.0 mmHg MV Vmax:       0.82 m/s MV Vmean:      56.1 cm/s MV Decel Time: 214 msec MV E velocity: 75.00 cm/s MV A velocity: 82.30 cm/s MV E/A ratio:  0.91 Julien Nordmann MD Electronically signed by Julien Nordmann MD Signature Date/Time: 03/26/2022/12:20:47 PM    Final    ECHO TEE  Result Date: 03/30/2022    TRANSESOPHOGEAL ECHO REPORT   Patient Name:   OLUFUNKE BLICKENSTAFF Lawal Date of Exam: 03/30/2022 Medical Rec #:  [id number]        Height:       65.0 in Accession #:    [id number]       Weight:       145.1 lb Date of Birth:  1946/06/28       BSA:          1.726 m Patient Age:    31 years  BP:           106/65 mmHg Patient Gender: F                HR:           80 bpm. Exam Location:  [facility] Procedure: Transesophageal Echo, Color Doppler and Cardiac Doppler Indications:     Not listed on TEE check-in sheet.  History:         Patient has prior history of Echocardiogram examinations, most                  recent 03/26/2022. Previous Myocardial Infarction and Acute MI.                  CAD.  Sonographer:     Cristela Blue  Referring Phys:  [id number] [facility] K[facility] Diagnosing Phys: Adrian Blackwater PROCEDURE: The transesophogeal probe was passed without difficulty through the esophogus of the patient. Sedation performed by performing physician. Patients was under conscious sedation during this procedure. Anesthetic administered: of Fentanyl, 1.0mg  of Versed. The patient's vital signs; including heart rate, blood pressure, and oxygen saturation; remained stable throughout the procedure. The patient developed no complications during the procedure. IMPRESSIONS  1. Left ventricular ejection fraction, by estimation, is 60 to 65%. The left ventricle has normal function. The left ventricle has no regional wall motion abnormalities.  2. Right ventricular systolic function is normal. The right ventricular size is normal.  3. Left atrial size was mildly dilated. No left atrial/left atrial appendage thrombus was detected.  4. Right atrial size was mildly dilated.  5. The mitral valve is normal in structure. No evidence of mitral valve regurgitation. No evidence of mitral stenosis.  6. The aortic valve is normal in structure. Aortic valve regurgitation is not visualized. No aortic stenosis is present.  7. The inferior vena cava is normal in size with greater than 50% respiratory variability, suggesting right atrial pressure of 3 mmHg. Conclusion(s)/Recommendation(s): No evidence of vegetation/infective endocarditis on this transesophageael echocardiogram. FINDINGS  Left Ventricle: Left ventricular ejection fraction, by estimation, is 60 to 65%. The left ventricle has normal function. The left ventricle has no regional wall motion abnormalities. The left ventricular internal cavity size was normal in size. There is  no left ventricular hypertrophy. Right Ventricle: The right ventricular size is normal. No increase in right ventricular wall thickness. Right ventricular systolic function is normal. Left Atrium: Left atrial size was mildly dilated. No  left atrial/left atrial appendage thrombus was detected. Right Atrium: Right atrial size was mildly dilated. Pericardium: There is no evidence of pericardial effusion. Mitral Valve: The mitral valve is normal in structure. No evidence of mitral valve regurgitation. No evidence of mitral valve stenosis. Tricuspid Valve: The tricuspid valve is normal in structure. Tricuspid valve regurgitation is not demonstrated. No evidence of tricuspid stenosis. Aortic Valve: The aortic valve is normal in structure. Aortic valve regurgitation is not visualized. No aortic stenosis is present. Pulmonic Valve: The pulmonic valve was normal in structure. Pulmonic valve regurgitation is not visualized. No evidence of pulmonic stenosis. Aorta: The aortic root is normal in size and structure. Venous: The inferior vena cava is normal in size with greater than 50% respiratory variability, suggesting right atrial pressure of 3 mmHg. IAS/Shunts: No atrial level shunt detected by color flow Doppler. Adrian Blackwater Electronically signed by Adrian Blackwater Signature Date/Time: 03/30/2022/9:11:25 AM    Final       ASSESSMENT & PLAN:  1. Small cell lung cancer (  HCC)   2. Encounter for antineoplastic immunotherapy   3. 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 Other acute pulmonary embolism without acute cor pulmonale (HCC)   5. Transaminitis   6. Cerebrovascular accident (CVA), unspecified mechanism (HCC)    #Extensive small cell lung cancer, S/p 4 cycles of Carboplatin, Etoposide and Tecentriq.   Currently on Tecentriq maintenance. Labs reviewed and discussed with patient.  Proceed with Tecentriq today.  #Transaminitis, possibly secondary to increased dose of statin.  Count has improved.  # CVA, follow up with neurology.  Continue aspirin 81 mg daily. #Acute pulmonary embolism, likely provoked due to bacteremia/immobility secondary to weakness.  Patient has been off anticoagulation since her discharge on 03/31/2022.  Recommend patient  to take Eliquis 10 mg twice daily for 1 week followed by Eliquis 5 mg twice daily.  Prescription was sent to pharmacy.  Patient has never gotten her Xarelto and I discontinued the Xarelto prescription.  # History bone metastasis and Osteopenia  zometa every 4-6 weeks. .  Continue calcium supplementation..  Proceed with Zometa today.  Follow up in 3 weeks for next cycle of Tecentriq   Rickard Patience, MD, PhD

## 2022-04-28 DIAGNOSIS — I25118 Atherosclerotic heart disease of native coronary artery with other forms of angina pectoris: Secondary | ICD-10-CM | POA: Diagnosis not present

## 2022-04-28 DIAGNOSIS — I959 Hypotension, unspecified: Secondary | ICD-10-CM | POA: Diagnosis not present

## 2022-04-28 DIAGNOSIS — I119 Hypertensive heart disease without heart failure: Secondary | ICD-10-CM | POA: Diagnosis not present

## 2022-04-28 DIAGNOSIS — R7881 Bacteremia: Secondary | ICD-10-CM | POA: Diagnosis not present

## 2022-04-28 DIAGNOSIS — I252 Old myocardial infarction: Secondary | ICD-10-CM | POA: Diagnosis not present

## 2022-04-28 DIAGNOSIS — E876 Hypokalemia: Secondary | ICD-10-CM | POA: Diagnosis not present

## 2022-04-29 DIAGNOSIS — I119 Hypertensive heart disease without heart failure: Secondary | ICD-10-CM | POA: Diagnosis not present

## 2022-04-29 DIAGNOSIS — I252 Old myocardial infarction: Secondary | ICD-10-CM | POA: Diagnosis not present

## 2022-04-29 DIAGNOSIS — I25118 Atherosclerotic heart disease of native coronary artery with other forms of angina pectoris: Secondary | ICD-10-CM | POA: Diagnosis not present

## 2022-04-29 DIAGNOSIS — R7881 Bacteremia: Secondary | ICD-10-CM | POA: Diagnosis not present

## 2022-04-29 DIAGNOSIS — E876 Hypokalemia: Secondary | ICD-10-CM | POA: Diagnosis not present

## 2022-04-29 DIAGNOSIS — I959 Hypotension, unspecified: Secondary | ICD-10-CM | POA: Diagnosis not present

## 2022-04-30 DIAGNOSIS — I119 Hypertensive heart disease without heart failure: Secondary | ICD-10-CM | POA: Diagnosis not present

## 2022-04-30 DIAGNOSIS — R7881 Bacteremia: Secondary | ICD-10-CM | POA: Diagnosis not present

## 2022-04-30 DIAGNOSIS — I25118 Atherosclerotic heart disease of native coronary artery with other forms of angina pectoris: Secondary | ICD-10-CM | POA: Diagnosis not present

## 2022-04-30 DIAGNOSIS — E876 Hypokalemia: Secondary | ICD-10-CM | POA: Diagnosis not present

## 2022-04-30 DIAGNOSIS — I252 Old myocardial infarction: Secondary | ICD-10-CM | POA: Diagnosis not present

## 2022-04-30 DIAGNOSIS — I959 Hypotension, unspecified: Secondary | ICD-10-CM | POA: Diagnosis not present

## 2022-05-03 DIAGNOSIS — C3402 Malignant neoplasm of left main bronchus: Secondary | ICD-10-CM | POA: Diagnosis not present

## 2022-05-03 DIAGNOSIS — E876 Hypokalemia: Secondary | ICD-10-CM | POA: Diagnosis not present

## 2022-05-03 DIAGNOSIS — K589 Irritable bowel syndrome without diarrhea: Secondary | ICD-10-CM | POA: Diagnosis not present

## 2022-05-03 DIAGNOSIS — R7303 Prediabetes: Secondary | ICD-10-CM | POA: Diagnosis not present

## 2022-05-03 DIAGNOSIS — M5030 Other cervical disc degeneration, unspecified cervical region: Secondary | ICD-10-CM | POA: Diagnosis not present

## 2022-05-03 DIAGNOSIS — G8929 Other chronic pain: Secondary | ICD-10-CM | POA: Diagnosis not present

## 2022-05-03 DIAGNOSIS — C7931 Secondary malignant neoplasm of brain: Secondary | ICD-10-CM | POA: Diagnosis not present

## 2022-05-03 DIAGNOSIS — I252 Old myocardial infarction: Secondary | ICD-10-CM | POA: Diagnosis not present

## 2022-05-03 DIAGNOSIS — D630 Anemia in neoplastic disease: Secondary | ICD-10-CM | POA: Diagnosis not present

## 2022-05-03 DIAGNOSIS — E559 Vitamin D deficiency, unspecified: Secondary | ICD-10-CM | POA: Diagnosis not present

## 2022-05-03 DIAGNOSIS — M1711 Unilateral primary osteoarthritis, right knee: Secondary | ICD-10-CM | POA: Diagnosis not present

## 2022-05-03 DIAGNOSIS — E7800 Pure hypercholesterolemia, unspecified: Secondary | ICD-10-CM | POA: Diagnosis not present

## 2022-05-03 DIAGNOSIS — I700 Atherosclerosis of aorta: Secondary | ICD-10-CM | POA: Diagnosis not present

## 2022-05-03 DIAGNOSIS — K5900 Constipation, unspecified: Secondary | ICD-10-CM | POA: Diagnosis not present

## 2022-05-03 DIAGNOSIS — Z9181 History of falling: Secondary | ICD-10-CM | POA: Diagnosis not present

## 2022-05-03 DIAGNOSIS — I959 Hypotension, unspecified: Secondary | ICD-10-CM | POA: Diagnosis not present

## 2022-05-03 DIAGNOSIS — I25118 Atherosclerotic heart disease of native coronary artery with other forms of angina pectoris: Secondary | ICD-10-CM | POA: Diagnosis not present

## 2022-05-03 DIAGNOSIS — K219 Gastro-esophageal reflux disease without esophagitis: Secondary | ICD-10-CM | POA: Diagnosis not present

## 2022-05-03 DIAGNOSIS — I2699 Other pulmonary embolism without acute cor pulmonale: Secondary | ICD-10-CM | POA: Diagnosis not present

## 2022-05-03 DIAGNOSIS — R1310 Dysphagia, unspecified: Secondary | ICD-10-CM | POA: Diagnosis not present

## 2022-05-03 DIAGNOSIS — I119 Hypertensive heart disease without heart failure: Secondary | ICD-10-CM | POA: Diagnosis not present

## 2022-05-03 DIAGNOSIS — F40240 Claustrophobia: Secondary | ICD-10-CM | POA: Diagnosis not present

## 2022-05-03 DIAGNOSIS — G3184 Mild cognitive impairment, so stated: Secondary | ICD-10-CM | POA: Diagnosis not present

## 2022-05-03 DIAGNOSIS — M8580 Other specified disorders of bone density and structure, unspecified site: Secondary | ICD-10-CM | POA: Diagnosis not present

## 2022-05-03 DIAGNOSIS — R7881 Bacteremia: Secondary | ICD-10-CM | POA: Diagnosis not present

## 2022-05-07 ENCOUNTER — Telehealth: Payer: Self-pay

## 2022-05-07 NOTE — Telephone Encounter (Signed)
Last office visit printed and faxed.  KP

## 2022-05-07 NOTE — Telephone Encounter (Signed)
Copied from [facility] [phone]. Topic: General - Other >> May 06, 2022  4:29 PM Alanda Slim E wrote: Reason for CRM: Tiffany from [facility] / needs a copy of last OV notes faxed to [phone] advise

## 2022-05-07 NOTE — Telephone Encounter (Signed)
Please call pt for an appt for cholesterol follow up.  KP

## 2022-05-12 ENCOUNTER — Telehealth: Payer: Self-pay | Admitting: Oncology

## 2022-05-12 ENCOUNTER — Other Ambulatory Visit (INDEPENDENT_AMBULATORY_CARE_PROVIDER_SITE_OTHER): Payer: Medicare Other | Admitting: Internal Medicine

## 2022-05-12 DIAGNOSIS — R296 Repeated falls: Secondary | ICD-10-CM

## 2022-05-12 DIAGNOSIS — R7881 Bacteremia: Secondary | ICD-10-CM

## 2022-05-12 DIAGNOSIS — E785 Hyperlipidemia, unspecified: Secondary | ICD-10-CM

## 2022-05-12 DIAGNOSIS — C7931 Secondary malignant neoplasm of brain: Secondary | ICD-10-CM | POA: Insufficient documentation

## 2022-05-12 DIAGNOSIS — E876 Hypokalemia: Secondary | ICD-10-CM

## 2022-05-12 DIAGNOSIS — I2510 Atherosclerotic heart disease of native coronary artery without angina pectoris: Secondary | ICD-10-CM

## 2022-05-12 DIAGNOSIS — I700 Atherosclerosis of aorta: Secondary | ICD-10-CM

## 2022-05-12 DIAGNOSIS — I959 Hypotension, unspecified: Secondary | ICD-10-CM

## 2022-05-12 DIAGNOSIS — G3184 Mild cognitive impairment, so stated: Secondary | ICD-10-CM

## 2022-05-12 DIAGNOSIS — B955 Unspecified streptococcus as the cause of diseases classified elsewhere: Secondary | ICD-10-CM | POA: Diagnosis not present

## 2022-05-12 DIAGNOSIS — C3402 Malignant neoplasm of left main bronchus: Secondary | ICD-10-CM

## 2022-05-12 DIAGNOSIS — I2699 Other pulmonary embolism without acute cor pulmonale: Secondary | ICD-10-CM

## 2022-05-12 DIAGNOSIS — I5032 Chronic diastolic (congestive) heart failure: Secondary | ICD-10-CM

## 2022-05-12 DIAGNOSIS — Z8589 Personal history of malignant neoplasm of other organs and systems: Secondary | ICD-10-CM

## 2022-05-12 DIAGNOSIS — K5900 Constipation, unspecified: Secondary | ICD-10-CM

## 2022-05-12 DIAGNOSIS — K219 Gastro-esophageal reflux disease without esophagitis: Secondary | ICD-10-CM

## 2022-05-12 NOTE — Progress Notes (Signed)
Received home health orders orders from [facility]. Start of care 04/03/22.   Certification and orders from 04/03/22 through 06/01/22 are reviewed, signed and faxed back to home health company.  Need of intermittent skilled services at home: home bound  The home health care plan has been established by me and will be reviewed and updated as needed to maximize patient recovery.  I certify that all home health services have been and will be furnished to the patient while under my care.  Face-to-face encounter in which the need for home health services was established: 06/19/21.  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

## 2022-05-12 NOTE — Telephone Encounter (Signed)
Please go ahead and r/s appt and leave new appt details on VM. Pt usually does not answer but will listen to the VM.

## 2022-05-12 NOTE — Telephone Encounter (Signed)
Please r/s patient for portlabs/MD/tecentriq next available week of 5/30 or when patient gets back to town.

## 2022-05-12 NOTE — Telephone Encounter (Signed)
Good morning, Patients husband called to let us know they are out of town for a family emergency and wont return until 5/30. They wont be here for the appointment on 5.30 and wanted to reschedule. Please advise. Thank you

## 2022-05-12 NOTE — Telephone Encounter (Signed)
Looks like pt will be back to town on 5/30, so r/s to later in the week.

## 2022-05-19 ENCOUNTER — Ambulatory Visit: Payer: Medicare Other | Admitting: Oncology

## 2022-05-19 ENCOUNTER — Other Ambulatory Visit: Payer: Medicare Other

## 2022-05-19 ENCOUNTER — Ambulatory Visit: Payer: Medicare Other

## 2022-05-21 ENCOUNTER — Inpatient Hospital Stay: Payer: Medicare Other | Admitting: Oncology

## 2022-05-21 ENCOUNTER — Other Ambulatory Visit: Payer: Self-pay | Admitting: *Deleted

## 2022-05-21 ENCOUNTER — Encounter: Payer: Self-pay | Admitting: Oncology

## 2022-05-21 ENCOUNTER — Inpatient Hospital Stay: Payer: Medicare Other

## 2022-05-21 ENCOUNTER — Inpatient Hospital Stay: Payer: Medicare Other | Attending: Oncology

## 2022-05-21 DIAGNOSIS — R197 Diarrhea, unspecified: Secondary | ICD-10-CM | POA: Insufficient documentation

## 2022-05-21 DIAGNOSIS — Z8673 Personal history of transient ischemic attack (TIA), and cerebral infarction without residual deficits: Secondary | ICD-10-CM | POA: Insufficient documentation

## 2022-05-21 DIAGNOSIS — M8580 Other specified disorders of bone density and structure, unspecified site: Secondary | ICD-10-CM | POA: Insufficient documentation

## 2022-05-21 DIAGNOSIS — Z7982 Long term (current) use of aspirin: Secondary | ICD-10-CM | POA: Insufficient documentation

## 2022-05-21 DIAGNOSIS — Z87891 Personal history of nicotine dependence: Secondary | ICD-10-CM | POA: Insufficient documentation

## 2022-05-21 DIAGNOSIS — C3490 Malignant neoplasm of unspecified part of unspecified bronchus or lung: Secondary | ICD-10-CM | POA: Insufficient documentation

## 2022-05-21 DIAGNOSIS — Z85830 Personal history of malignant neoplasm of bone: Secondary | ICD-10-CM | POA: Insufficient documentation

## 2022-05-21 DIAGNOSIS — Z86711 Personal history of pulmonary embolism: Secondary | ICD-10-CM | POA: Insufficient documentation

## 2022-05-21 DIAGNOSIS — Z79899 Other long term (current) drug therapy: Secondary | ICD-10-CM | POA: Insufficient documentation

## 2022-05-21 DIAGNOSIS — Z8589 Personal history of malignant neoplasm of other organs and systems: Secondary | ICD-10-CM | POA: Insufficient documentation

## 2022-05-21 MED ORDER — SERTRALINE HCL 100 MG PO TABS: 100.0000 mg | ORAL_TABLET | Freq: Every day | ORAL | 3 refills | Status: DC

## 2022-05-28 DIAGNOSIS — I959 Hypotension, unspecified: Secondary | ICD-10-CM | POA: Diagnosis not present

## 2022-05-28 DIAGNOSIS — I252 Old myocardial infarction: Secondary | ICD-10-CM | POA: Diagnosis not present

## 2022-05-28 DIAGNOSIS — I25118 Atherosclerotic heart disease of native coronary artery with other forms of angina pectoris: Secondary | ICD-10-CM | POA: Diagnosis not present

## 2022-05-28 DIAGNOSIS — R7881 Bacteremia: Secondary | ICD-10-CM | POA: Diagnosis not present

## 2022-05-28 DIAGNOSIS — I119 Hypertensive heart disease without heart failure: Secondary | ICD-10-CM | POA: Diagnosis not present

## 2022-05-28 DIAGNOSIS — E876 Hypokalemia: Secondary | ICD-10-CM | POA: Diagnosis not present

## 2022-06-01 ENCOUNTER — Inpatient Hospital Stay (HOSPITAL_BASED_OUTPATIENT_CLINIC_OR_DEPARTMENT_OTHER): Payer: Medicare Other | Admitting: Oncology

## 2022-06-01 ENCOUNTER — Inpatient Hospital Stay: Payer: Medicare Other

## 2022-06-01 ENCOUNTER — Encounter: Payer: Self-pay | Admitting: Oncology

## 2022-06-01 VITALS — BP 95/70 | HR 9 | Temp 97.6°F | Resp 18 | Wt 133.3 lb

## 2022-06-01 DIAGNOSIS — R197 Diarrhea, unspecified: Secondary | ICD-10-CM

## 2022-06-01 DIAGNOSIS — Z87891 Personal history of nicotine dependence: Secondary | ICD-10-CM | POA: Diagnosis not present

## 2022-06-01 DIAGNOSIS — I639 Cerebral infarction, unspecified: Secondary | ICD-10-CM | POA: Diagnosis not present

## 2022-06-01 DIAGNOSIS — M8580 Other specified disorders of bone density and structure, unspecified site: Secondary | ICD-10-CM | POA: Diagnosis not present

## 2022-06-01 DIAGNOSIS — Z86711 Personal history of pulmonary embolism: Secondary | ICD-10-CM | POA: Diagnosis not present

## 2022-06-01 DIAGNOSIS — Z8589 Personal history of malignant neoplasm of other organs and systems: Secondary | ICD-10-CM | POA: Diagnosis not present

## 2022-06-01 DIAGNOSIS — C3490 Malignant neoplasm of unspecified part of unspecified bronchus or lung: Secondary | ICD-10-CM

## 2022-06-01 DIAGNOSIS — Z8673 Personal history of transient ischemic attack (TIA), and cerebral infarction without residual deficits: Secondary | ICD-10-CM | POA: Diagnosis not present

## 2022-06-01 DIAGNOSIS — Z85830 Personal history of malignant neoplasm of bone: Secondary | ICD-10-CM | POA: Diagnosis not present

## 2022-06-01 DIAGNOSIS — Z79899 Other long term (current) drug therapy: Secondary | ICD-10-CM | POA: Diagnosis not present

## 2022-06-01 DIAGNOSIS — Z7982 Long term (current) use of aspirin: Secondary | ICD-10-CM | POA: Diagnosis not present

## 2022-06-01 LAB — CBC WITH DIFFERENTIAL/PLATELET
Abs Immature Granulocytes: 0.02 10*3/uL (ref 0.00–0.07)
Basophils Absolute: 0.1 10*3/uL (ref 0.0–0.1)
Basophils Relative: 1 %
Eosinophils Absolute: 0.3 10*3/uL (ref 0.0–0.5)
Eosinophils Relative: 5 %
HCT: 36.2 % (ref 36.0–46.0)
Hemoglobin: 11.6 g/dL — ABNORMAL LOW (ref 12.0–15.0)
Immature Granulocytes: 0 %
Lymphocytes Relative: 26 %
Lymphs Abs: 1.7 10*3/uL (ref 0.7–4.0)
MCH: 28.4 pg (ref 26.0–34.0)
MCHC: 32 g/dL (ref 30.0–36.0)
MCV: 88.5 fL (ref 80.0–100.0)
Monocytes Absolute: 1 10*3/uL (ref 0.1–1.0)
Monocytes Relative: 15 %
Neutro Abs: 3.4 10*3/uL (ref 1.7–7.7)
Neutrophils Relative %: 53 %
Platelets: 200 10*3/uL (ref 150–400)
RBC: 4.09 MIL/uL (ref 3.87–5.11)
RDW: 13.7 % (ref 11.5–15.5)
WBC: 6.4 10*3/uL (ref 4.0–10.5)
nRBC: 0 % (ref 0.0–0.2)

## 2022-06-01 LAB — COMPREHENSIVE METABOLIC PANEL
ALT: 14 U/L (ref 0–44)
AST: 25 U/L (ref 15–41)
Albumin: 3.9 g/dL (ref 3.5–5.0)
Alkaline Phosphatase: 59 U/L (ref 38–126)
Anion gap: 9 (ref 5–15)
BUN: 17 mg/dL (ref 8–23)
CO2: 22 mmol/L (ref 22–32)
Calcium: 9 mg/dL (ref 8.9–10.3)
Chloride: 105 mmol/L (ref 98–111)
Creatinine, Ser: 0.9 mg/dL (ref 0.44–1.00)
GFR, Estimated: >60 mL/min (ref >60)
Glucose, Bld: 75 mg/dL (ref 70–99)
Potassium: 3.6 mmol/L (ref 3.5–5.1)
Sodium: 136 mmol/L (ref 135–145)
Total Bilirubin: 0.5 mg/dL (ref 0.3–1.2)
Total Protein: 7.6 g/dL (ref 6.5–8.1)

## 2022-06-01 MED ORDER — HEPARIN SOD (PORK) LOCK FLUSH 100 UNIT/ML IV SOLN
500.0000 [IU] | Freq: Once | INTRAVENOUS | Status: AC
  Administered 2022-06-01: 500 [IU] via INTRAVENOUS
  Filled 2022-06-01: qty 5

## 2022-06-01 NOTE — Progress Notes (Signed)
Hematology/Oncology Progress note [phone] [phone]      Patient Care Team: Glean Hess, MD as PCP - General (Internal Medicine) Charolette Forward, MD as Consulting Physician (Cardiology) Telford Nab, RN as Registered Nurse [name], MD as Radiation Oncologist (Radiation Oncology)   REASON FOR VISIT:  Follow-up for small cell lung cancer,   HISTORY OF PRESENTING ILLNESS:  Kristi [name] is a  76 y.o.  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  10/20/2018 CT chest with contrast showed large mediastinal mass involving both hilar, left greater than right, consistent with lung carcinoma, The mass causes narrowing of the left mainstem bronchus with resultant volume loss on the left and a mediastinal shift to the left.  Moderate size left pleural effusion. 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  # Initial MRI brain negative.  # Nov 2019- Jan 2020 s/p 4 cycles of Carbo/Etoposide/Tecentriq #  01/24/2019 interim CT scan done which showed continued positive response to therapy with continued reduction in mediastinal adenopathy.  No residual measurable left lung mass. CT findings of acute emphysematous cystitis. Urology did not feel that patient has pyelonephritis and recommend treatment with antibiotics because patient's immunocompromise. Patient finished treatment.   # 11/02/2018-01/09/2019 Chemotherapy carboplatin + Etoposide + tecentriq # Consolidation chest radiation and whole brain radiation finished in May 2020 # 04/25/2019 resume Tecentriq every 3 weeks. # Patient had a fall from her stairs on 01/19/2020. In the emergency room she had CT chest abdomen pelvis CT, CT head without contrast, CT cervical spine without contrast. No CT evidence for acute intracranial abnormality, degenerative changes of cervical spine..   No CT evidence of acute thoracic abdomen injury.  Severe compression fracture of T7, subacute.  # kyphoplasty procedure on 03/01/2020.  T7 biopsy negative for cancer.  11/20/2020 Surveillance CT scan  Reduced size and prominence of right lateral lung base subpleural nodular density. No findings of active malignancy.  MRI brain showed stable post treatment brain. No metastatic disease.   # 10/29/2021 MRI brain w and wo contrast showed two definite new enhancing lesions in cerebellum, with associated restricted diffusion and increased T2 signal, concerning for new foci of metastatic disease. 2. A third lesion in the right cerebellar hemisphere was likely present on the prior exam but continues to show limited contrast enhancement on SPGR; this lesion is associated with diffusion restriction and increased T2 signal, but, on coronal and sagittal T1 sequences, it correlates with an area affected by pulsation artifact. This is also felt to represent a new focus of metastatic disease.  11/20/2021 PET scan - no FDG avid disease in neck chest abdomen or pelvis.  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 No enhancing metastatic disease seen today, chronic small vessel ischemia.   12/19/2021 was seen by neurology Dr.Vaslow. increase ASA to 325mg  daily. Atorvastatin.  40 mg  03/13/2022 - 03/18/2022, patient was hospitalized due to lower extremity weakness CT head did not show any abnormality.  MRI brain confirmed no evidence of new CVA.  Patient was seen by neurologist and weakness was felt to be multifactorial. CT chest angiogram protocol showed bilateral pulmonary emboli, patient was started on heparin drip and transition to Xarelto at discharge. For 03/25/2022-4 /10/2022, patient was readmitted due to positive blood culture which was done during her  previous admission.  Blood culture was positive  for Streptococcus species in both bottles.  Patient had soft blood pressures initially and was given supportive care and IV antibiotics.  She completed antibiotic course.  At discharge, she was recommended to continue anticoagulation.  Both patient and husband are not aware about diagnosis of pulmonary embolism and not aware about new prescription of Xarelto. Xarelto appeared to be prescribed and prescription was printed out.  Patient and husband both not aware about the new prescription.  Patient has been off anticoagulation presumably since 03/31/2022.  Occasionally she has shortness of breath.  No chest pain.  She reports feeling 100% better today.   INTERVAL HISTORY Kristi [name] is a 76 y.o. female who has above history reviewed by me today presents for evaluation prior to immunotherapy for treatment of extensive small cell lung cancer  Patient has rescheduled appointment due to trip to [place].  She was accompanied by husband.  He reports feeling well except intermittent diarrhea.  Appetite is fair.  She has lost 3 pounds since 4 weeks ago. No other new complaints.  Review of Systems  Constitutional:  Positive for fatigue. Negative for appetite change, chills and fever.  HENT:   Negative for hearing loss and voice change.   Eyes:  Negative for eye problems.  Respiratory:  Positive for shortness of breath. Negative for chest tightness and cough.   Cardiovascular:  Negative for chest pain.  Gastrointestinal:  Negative for abdominal distention, abdominal pain, blood in stool, diarrhea and nausea.  Endocrine: Negative for hot flashes.  Genitourinary:  Negative for difficulty urinating and frequency.   Musculoskeletal:  Negative for arthralgias and back pain.       Lower extremity weakness  Skin:  Negative for itching and rash.  Neurological:  Negative for extremity weakness, headaches and light-headedness.  Hematological:  Negative for adenopathy.  Psychiatric/Behavioral:  Negative for  confusion and depression. The patient is not nervous/anxious.        Forgetful     MEDICAL HISTORY:  Past Medical History:  Diagnosis Date   Acute MI, inferoposterior wall ([place]) 09/30/2014   1 stent   Claustrophobia    Coronary artery disease    GERD (gastroesophageal reflux disease)    Hypercholesteremia    MI, old    Pneumonia    Small cell lung cancer ([place])    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 ([place]) 10/27/2018    SURGICAL HISTORY: Past Surgical History:  Procedure Laterality Date   APPENDECTOMY     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   BLADDER NECK SUSPENSION     CHOLECYSTECTOMY N/A 08/14/2018   Procedure: LAPAROSCOPIC CHOLECYSTECTOMY;  Surgeon: Jules Husbands, MD;  Location: [facility] ORS;  Service: General;  Laterality: N/A;   CHOLECYSTECTOMY  11/2018   CORONARY ANGIOPLASTY  09/29/2014   CORONARY STENT PLACEMENT     ENDOBRONCHIAL ULTRASOUND N/A 10/21/2018   Procedure: ENDOBRONCHIAL ULTRASOUND;  Surgeon: Laverle Hobby, MD;  Location: [facility] ORS;  Service: Pulmonary;  Laterality: N/A;   HERNIA REPAIR  2012   IR FLUORO GUIDE CV LINE RIGHT  12/19/2018   KYPHOPLASTY N/A 03/01/2020   Procedure: T7 KYPHOPLASTY;  Surgeon: Hessie Knows, MD;  Location: [facility] ORS;  Service: Orthopedics;  Laterality: N/A;   LAPAROTOMY     LEFT HEART CATHETERIZATION WITH CORONARY ANGIOGRAM N/A 09/29/2014   Procedure: LEFT HEART CATHETERIZATION WITH CORONARY ANGIOGRAM;  Surgeon: Clent Demark, MD;  Location: [facility];  Service: Cardiovascular;  Laterality: N/A;   PORTA  CATH INSERTION N/A 01/02/2019   Procedure: PORTA CATH INSERTION;  Surgeon: Algernon Huxley, MD;  Location: [facility];  Service: Cardiovascular;  Laterality: N/A;   PORTACATH PLACEMENT Right 10/28/2018   Procedure: INSERTION PORT-A-CATH;  Surgeon: Jules Husbands, MD;  Location: [facility];  Service: General;  Laterality: Right;   TEE WITHOUT CARDIOVERSION Right 03/30/2022   Procedure: TRANSESOPHAGEAL ECHOCARDIOGRAM (TEE);  Surgeon: Dionisio David, MD;  Location: [facility];  Service: Cardiovascular;  Laterality: Right;   XI ROBOTIC ASSISTED VENTRAL HERNIA N/A 02/27/2021   Procedure: XI ROBOTIC ASSISTED VENTRAL HERNIA (incisional) WITH MESH;  Surgeon: Olean Ree, MD;  Location: [facility] OR[facility];  Service: General;  Laterality: N/A;    SOCIAL HISTORY: Social History   Socioeconomic History   Marital status: Married    Spouse name: [name]    Number of children: 3   Years of education: Not on file   Highest education level: Not on file  Occupational History   Occupation: Retired    Comment: Chief Technology Officer   Tobacco Use   Smoking status: Former    Packs/day: 1.00    Years: 39.00    Total pack years: 39.00    Types: Cigarettes    Start date: 12/21/1978    Quit date: 10/16/2018    Years since quitting: 3.6   Smokeless tobacco: Never  Vaping Use   Vaping Use: Never used  Substance and Sexual Activity   Alcohol use: Yes    Alcohol/week: 0.0 standard drinks of alcohol    Comment: occassional - approx 1 every 2 weeks    Drug use: No   Sexual activity: Yes    Birth control/protection: None  Other Topics Concern   Not on file  Social History Narrative   Not on file   Social Determinants of Health   Financial Resource Strain: Low Risk  (01/02/2019)   Overall Financial Resource Strain (CARDIA)    Difficulty of Paying Living Expenses: Not very hard  Food Insecurity: Not on file  Transportation Needs: No Transportation Needs (01/02/2019)   PRAPARE - Hydrologist (Medical): No    Lack of Transportation (Non-Medical): No  Physical Activity: Unknown (01/02/2019)   Exercise Vital Sign    Days of Exercise per Week: 0 days    Minutes of Exercise per Session: Not on file  Stress: Stress Concern Present (01/02/2019)   [place]    Feeling of Stress : To some extent  Social Connections: Unknown (01/02/2019)   Social Connection and Isolation  Panel [NHANES]    Frequency of Communication with Friends and Family: Three times a week    Frequency of Social Gatherings with Friends and Family: Once a week    Attends Religious Services: 1 to 4 times per year    Active Member of [employer] or Organizations: Not on file    Attends Archivist Meetings: Not on file    Marital Status: Married  Intimate Partner Violence: Not At Risk (01/02/2019)   Humiliation, Afraid, Rape, and Kick questionnaire    Fear of Current or Ex-Partner: No    Emotionally Abused: No    Physically Abused: No    Sexually Abused: No    FAMILY HISTORY: Family History  Problem Relation Age of Onset   Alzheimer's disease Mother    Colon cancer Father    Breast cancer Neg Hx     ALLERGIES:  has No Known Allergies.  MEDICATIONS:  Current Outpatient Medications  Medication Sig Dispense Refill   acetaminophen (TYLENOL) 500 MG tablet Take 2 tablets (1,000 mg total) by mouth every 6 (six) hours as needed for mild pain or headache.     alum & mag hydroxide-simeth (MAALOX/MYLANTA) 200-200-20 MG/5ML suspension Take 30 mLs by mouth every 6 (six) hours as needed for indigestion or heartburn. 355 mL 0   APIXABAN (ELIQUIS) VTE STARTER PACK (10MG  AND 5MG ) Take as directed on package: start with two-5mg  tablets twice daily for 7 days. On day 8, switch to one-5mg  tablet twice daily. 1 each 0   aspirin 325 MG tablet Take 325 mg by mouth daily.     atorvastatin (LIPITOR) 40 MG tablet Take 1 tablet (40 mg total) by mouth daily. 30 tablet 3   Calcium 600-200 MG-UNIT tablet Take 1 tablet by mouth 2 (two) times daily.     Cholecalciferol (DIALYVITE VITAMIN D 5000) 125 MCG (5000 UT) capsule Take 5,000 Units by mouth daily.     diphenhydrAMINE (BENADRYL) 25 mg capsule Take 1 capsule (25 mg total) by mouth at bedtime as needed (if no sleep even with trazodone). 30 capsule 0   lidocaine-prilocaine (EMLA) cream Apply 1 application topically daily as needed (port access). 30 g 2    Magnesium 250 MG TABS Take 250 mg by mouth daily.     midodrine (PROAMATINE) 5 MG tablet Take 1 tablet (5 mg total) by mouth 3 (three) times daily with meals. 90 tablet 1   pantoprazole (PROTONIX) 40 MG tablet Take 1 tablet (40 mg total) by mouth 2 (two) times daily. (Patient taking differently: Take 40 mg by mouth 2 (two) times daily as needed.) 60 tablet 0   prochlorperazine (COMPAZINE) 10 MG tablet Take 10 mg by mouth every 6 (six) hours as needed for nausea or vomiting.     sertraline (ZOLOFT) 100 MG tablet Take 1 tablet (100 mg total) by mouth daily. 90 tablet 3   sucralfate (CARAFATE) 1 g tablet Take 1 g by mouth 3 (three) times daily as needed (heartburn).     traZODone (DESYREL) 50 MG tablet Take 1 tablet (50 mg total) by mouth at bedtime as needed for sleep. 30 tablet 2   vitamin B-12 (CYANOCOBALAMIN) 1000 MCG tablet Take 1,000 mcg by mouth daily.     lidocaine (LIDODERM) 5 % Place 1 patch onto the skin daily. Remove & Discard patch within 12 hours or as directed by MD (Patient not taking: Reported on 04/27/2022) 30 patch 0   polyethylene glycol (MIRALAX / GLYCOLAX) 17 g packet Take 17 g by mouth daily as needed for mild constipation. (Patient not taking: Reported on 06/01/2022) 14 each 0   No current facility-administered medications for this visit.   Facility-Administered Medications Ordered in Other Visits  Medication Dose Route Frequency Provider Last Rate Last Admin   heparin lock flush 100 unit/mL  500 Units Intravenous Once Earlie Server, MD       heparin lock flush 100 unit/mL  500 Units Intravenous Once Earlie Server, MD       sodium chloride flush (NS) 0.9 % injection 10 mL  10 mL Intravenous PRN Earlie Server, MD   10 mL at 01/09/19 0820   sodium chloride flush (NS) 0.9 % injection 10 mL  10 mL Intravenous PRN Earlie Server, MD   10 mL at 01/10/19 1400     PHYSICAL EXAMINATION: ECOG PERFORMANCE STATUS: 1 - Symptomatic but completely ambulatory Vitals:   06/01/22 1344  BP: 95/70  Pulse: (!) 9   Resp: 18  Temp: 97.6 F (36.4 C)  SpO2: 100%   Filed Weights   06/01/22 1344  Weight: 133 lb 4.8 oz (60.5 kg)   Physical Examination Today's Vitals   06/01/22 1344  BP: 95/70  Pulse: (!) 9  Resp: 18  Temp: 97.6 F (36.4 C)  SpO2: 100%  Weight: 133 lb 4.8 oz (60.5 kg)  PainSc: 4    Body mass index is 22.18 kg/m.   Physical Exam Constitutional:      General: She is not in acute distress.    Appearance: She is not diaphoretic.  HENT:     Head: Normocephalic and atraumatic.     Nose: Nose normal.     Mouth/Throat:     Pharynx: No oropharyngeal exudate.  Eyes:     General: No scleral icterus.    Pupils: Pupils are equal, round, and reactive to light.  Cardiovascular:     Rate and Rhythm: Normal rate and regular rhythm.     Heart sounds: No murmur heard. Pulmonary:     Effort: Pulmonary effort is normal. No respiratory distress.     Breath sounds: No wheezing or rales.     Comments: Decreased breath sounds bilaterally.  Abdominal:     General: There is no distension.     Palpations: Abdomen is soft.     Tenderness: There is no abdominal tenderness.  Musculoskeletal:        General: Normal range of motion.     Cervical back: Normal range of motion and neck supple.  Skin:    General: Skin is warm and dry.     Findings: No erythema.  Neurological:     Mental Status: She is alert and oriented to person, place, and time.     Cranial Nerves: No cranial nerve deficit.     Motor: No abnormal muscle tone.     Coordination: Coordination normal.  Psychiatric:        Mood and Affect: Affect normal.        LABORATORY DATA:  I have reviewed the data as listed Lab Results  Component Value Date   WBC 6.4 06/01/2022   HGB 11.6 (L) 06/01/2022   HCT 36.2 06/01/2022   MCV 88.5 06/01/2022   PLT 200 06/01/2022   Recent Labs    03/24/22 2151 03/26/22 0718 03/28/22 0449 04/27/22 1335 06/01/22 1312  NA 140   < > 138 137 136  K 3.4*   < > 3.6 3.2* 3.6  CL 109    < > 110 103 105  CO2 23   < > 21* 23 22  GLUCOSE 119*   < > 104* 101* 75  BUN 11   < > 8 13 17   CREATININE 1.10*   < > 0.96 0.92 0.90  CALCIUM 9.4   < > 8.9 9.3 9.0  GFRNONAA 52*   < > >60 >60 >60  PROT 7.3  --   --  7.5 7.6  ALBUMIN 3.4*  --   --  3.5 3.9  AST 75*  --   --  45* 25  ALT 55*  --   --  30 14  ALKPHOS 97  --   --  85 59  BILITOT 0.5  --   --  0.8 0.5   < > = values in this interval not displayed.     RADIOGRAPHIC STUDIES: I have personally reviewed the radiological images as listed and agreed with the findings in  the report.  ECHO TEE  Result Date: 03/30/2022    TRANSESOPHOGEAL ECHO REPORT   Patient Name:   DAMYIAH MOXLEY [facility] Date of Exam: 03/30/2022 Medical Rec #:  [id number]        Height:       65.0 in Accession #:    [id number]       Weight:       145.1 lb Date of Birth:  [birth year]       BSA:          1.726 m Patient Age:    80 years         BP:           106/65 mmHg Patient Gender: F                HR:           80 bpm. Exam Location:  [facility] Procedure: Transesophageal Echo, Color Doppler and Cardiac Doppler Indications:     Not listed on TEE check-in sheet.  History:         Patient has prior history of Echocardiogram examinations, most                  recent 03/26/2022. Previous Myocardial Infarction and Acute MI.                  CAD.  Sonographer:     Sherrie Sport Referring Phys:  [place] Diagnosing Phys: [place]: The transesophogeal probe was passed without difficulty through the esophogus of the patient. Sedation performed by performing physician. Patients was under conscious sedation during this procedure. Anesthetic administered: 48mcg of Fentanyl, 1.0mg  of Versed. The patient's vital signs; including heart rate, blood pressure, and oxygen saturation; remained stable throughout the procedure. The patient developed no complications during the procedure. IMPRESSIONS  1. Left ventricular ejection fraction, by estimation, is 60 to 65%. The left  ventricle has normal function. The left ventricle has no regional wall motion abnormalities.  2. Right ventricular systolic function is normal. The right ventricular size is normal.  3. Left atrial size was mildly dilated. No left atrial/left atrial appendage thrombus was detected.  4. Right atrial size was mildly dilated.  5. The mitral valve is normal in structure. No evidence of mitral valve regurgitation. No evidence of mitral stenosis.  6. The aortic valve is normal in structure. Aortic valve regurgitation is not visualized. No aortic stenosis is present.  7. The inferior vena cava is normal in size with greater than 50% respiratory variability, suggesting right atrial pressure of 3 mmHg. Conclusion(s)/Recommendation(s): No evidence of vegetation/infective endocarditis on this transesophageael echocardiogram. FINDINGS  Left Ventricle: Left ventricular ejection fraction, by estimation, is 60 to 65%. The left ventricle has normal function. The left ventricle has no regional wall motion abnormalities. The left ventricular internal cavity size was normal in size. There is  no left ventricular hypertrophy. Right Ventricle: The right ventricular size is normal. No increase in right ventricular wall thickness. Right ventricular systolic function is normal. Left Atrium: Left atrial size was mildly dilated. No left atrial/left atrial appendage thrombus was detected. Right Atrium: Right atrial size was mildly dilated. Pericardium: There is no evidence of pericardial effusion. Mitral Valve: The mitral valve is normal in structure. No evidence of mitral valve regurgitation. No evidence of mitral valve stenosis. Tricuspid Valve: The tricuspid valve is normal in structure. Tricuspid valve regurgitation is not demonstrated. No evidence of tricuspid stenosis. Aortic Valve: The aortic valve  is normal in structure. Aortic valve regurgitation is not visualized. No aortic stenosis is present. Pulmonic Valve: The pulmonic valve was  normal in structure. Pulmonic valve regurgitation is not visualized. No evidence of pulmonic stenosis. Aorta: The aortic root is normal in size and structure. Venous: The inferior vena cava is normal in size with greater than 50% respiratory variability, suggesting right atrial pressure of 3 mmHg. IAS/Shunts: No atrial level shunt detected by color flow Doppler. Neoma Laming Electronically signed by Neoma Laming Signature Date/Time: 03/30/2022/9:11:25 AM    Final    DG Chest Port 1 View  Result Date: 03/29/2022 CLINICAL DATA:  Dyspnea EXAM: PORTABLE CHEST 1 VIEW COMPARISON:  03/24/2022 FINDINGS: Rotated AP portable examination. Right chest port catheter. The heart size and mediastinal contours are within normal limits. Both lungs are clear. The visualized skeletal structures are unremarkable. IMPRESSION: Rotated AP portable examination without acute abnormality of the lungs. Electronically Signed   By: Delanna Ahmadi M.D.   On: 03/29/2022 11:29   MR THORACIC SPINE W WO CONTRAST  Result Date: 03/27/2022 CLINICAL DATA:  Osteomyelitis, thoracic. Small cell lung carcinoma. Bacteremia. EXAM: MRI THORACIC WITHOUT AND WITH CONTRAST TECHNIQUE: Multiplanar and multiecho pulse sequences of the thoracic spine were obtained without and with intravenous contrast. CONTRAST:  77mL GADAVIST GADOBUTROL 1 MMOL/ML IV SOLN COMPARISON:  None. FINDINGS: Alignment:  Mild left convex asymmetry with apex at T8 Vertebrae: Chronic compression deformity of T7 with 60% height loss. Mild T4 compression deformity without edema. No abnormal contrast enhancement. Cord:  Normal signal and morphology. Paraspinal and other soft tissues: Negative Disc levels: T7-8: Small central disc protrusion without spinal canal stenosis. T8-9: Small central disc protrusion without spinal canal stenosis. The other disc levels are unremarkable. IMPRESSION: 1. No discitis-osteomyelitis or epidural abscess. No thoracic metastatic disease. 2. Chronic T4 and T7  compression deformities. Electronically Signed   By: Ulyses Jarred M.D.   On: 03/27/2022 19:35   ECHOCARDIOGRAM COMPLETE  Result Date: 03/26/2022    ECHOCARDIOGRAM REPORT   Patient Name:   MAREENA CAVAN [place] Date of Exam: 03/26/2022 Medical Rec #:  [id number]        Height:       65.0 in Accession #:    [id number]       Weight:       145.0 lb Date of Birth:  [birth year]       BSA:          1.725 m Patient Age:    82 years         BP:           116/80 mmHg Patient Gender: F                HR:           72 bpm. Exam Location:  [facility] Procedure: 2D Echo, Color Doppler and Cardiac Doppler Indications:     R78.81 Bacteremia  History:         Patient has prior history of Echocardiogram examinations, most                  recent 01/15/2022. CAD; Risk Factors:HCL.  Sonographer:     Charmayne Sheer Referring Phys:  [id number] Tsosie Billing Diagnosing Phys: Ida Rogue MD  Sonographer Comments: Suboptimal parasternal window. IMPRESSIONS  1. Left ventricular ejection fraction, by estimation, is 60 to 65%. The left ventricle has normal function. The left ventricle has no regional wall motion abnormalities. Left ventricular diastolic parameters are consistent with  Grade I diastolic dysfunction (impaired relaxation).  2. Right ventricular systolic function is normal. The right ventricular size is normal.  3. The mitral valve is normal in structure. Mild mitral valve regurgitation. No evidence of mitral stenosis.  4. The aortic valve is normal in structure. There is mild calcification of the aortic valve. Aortic valve regurgitation is not visualized. Aortic valve sclerosis is present, with no evidence of aortic valve stenosis.  5. The inferior vena cava is normal in size with greater than 50% respiratory variability, suggesting right atrial pressure of 3 mmHg.  6. No valve vegetation noted. FINDINGS  Left Ventricle: Left ventricular ejection fraction, by estimation, is 60 to 65%. The left ventricle has normal function. The left  ventricle has no regional wall motion abnormalities. The left ventricular internal cavity size was normal in size. There is  no left ventricular hypertrophy. Left ventricular diastolic parameters are consistent with Grade I diastolic dysfunction (impaired relaxation). Right Ventricle: The right ventricular size is normal. No increase in right ventricular wall thickness. Right ventricular systolic function is normal. Left Atrium: Left atrial size was normal in size. Right Atrium: Right atrial size was normal in size. Pericardium: There is no evidence of pericardial effusion. Mitral Valve: The mitral valve is normal in structure. Mild mitral valve regurgitation. No evidence of mitral valve stenosis. MV peak gradient, 2.7 mmHg. The mean mitral valve gradient is 1.0 mmHg. Tricuspid Valve: The tricuspid valve is normal in structure. Tricuspid valve regurgitation is not demonstrated. No evidence of tricuspid stenosis. Aortic Valve: The aortic valve is normal in structure. There is mild calcification of the aortic valve. Aortic valve regurgitation is not visualized. Aortic valve sclerosis is present, with no evidence of aortic valve stenosis. Aortic valve mean gradient  measures 8.0 mmHg. Aortic valve peak gradient measures 15.7 mmHg. Aortic valve area, by VTI measures 1.92 cm. Pulmonic Valve: The pulmonic valve was normal in structure. Pulmonic valve regurgitation is not visualized. No evidence of pulmonic stenosis. Aorta: The aortic root is normal in size and structure. Venous: The inferior vena cava is normal in size with greater than 50% respiratory variability, suggesting right atrial pressure of 3 mmHg. IAS/Shunts: No atrial level shunt detected by color flow Doppler.  LEFT VENTRICLE PLAX 2D LVIDd:         3.79 cm   Diastology LVIDs:         2.25 cm   LV e' medial:    6.53 cm/s LV PW:         0.65 cm   LV E/e' medial:  11.5 LV IVS:        0.57 cm   LV e' lateral:   8.05 cm/s LVOT diam:     2.30 cm   LV E/e' lateral:  9.3 LV SV:         78 LV SV Index:   45 LVOT Area:     4.15 cm  RIGHT VENTRICLE RV Basal diam:  2.55 cm LEFT ATRIUM             Index LA diam:        3.20 cm 1.85 cm/m LA Vol (A2C):   32.9 ml 19.07 ml/m LA Vol (A4C):   20.7 ml 12.00 ml/m LA Biplane Vol: 26.9 ml 15.59 ml/m  AORTIC VALVE                     PULMONIC VALVE AV Area (Vmax):    1.93 cm  PV Vmax:       0.82 m/s AV Area (Vmean):   1.70 cm      PV Vmean:      62.600 cm/s AV Area (VTI):     1.92 cm      PV VTI:        0.178 m AV Vmax:           198.00 cm/s   PV Peak grad:  2.7 mmHg AV Vmean:          137.000 cm/s  PV Mean grad:  2.0 mmHg AV VTI:            0.405 m AV Peak Grad:      15.7 mmHg AV Mean Grad:      8.0 mmHg LVOT Vmax:         92.00 cm/s LVOT Vmean:        55.900 cm/s LVOT VTI:          0.187 m LVOT/AV VTI ratio: 0.46  AORTA Ao Root diam: 3.20 cm MITRAL VALVE MV Area (PHT): 3.54 cm    SHUNTS MV Area VTI:   3.35 cm    Systemic VTI:  0.19 m MV Peak grad:  2.7 mmHg    Systemic Diam: 2.30 cm MV Mean grad:  1.0 mmHg MV Vmax:       0.82 m/s MV Vmean:      56.1 cm/s MV Decel Time: 214 msec MV E velocity: 75.00 cm/s MV A velocity: 82.30 cm/s MV E/A ratio:  0.91 Ida Rogue MD Electronically signed by Ida Rogue MD Signature Date/Time: 03/26/2022/12:20:47 PM    Final    CT ABDOMEN PELVIS W CONTRAST  Result Date: 03/24/2022 CLINICAL DATA:  Acute nonlocalized abdominal pain. Weakness, fatigue, and hypotension. EXAM: CT ABDOMEN AND PELVIS WITH CONTRAST TECHNIQUE: Multidetector CT imaging of the abdomen and pelvis was performed using the standard protocol following bolus administration of intravenous contrast. RADIATION DOSE REDUCTION: This exam was performed according to the departmental dose-optimization program which includes automated exposure control, adjustment of the mA and/or kV according to patient size and/or use of iterative reconstruction technique. CONTRAST:  162mL OMNIPAQUE IOHEXOL 300 MG/ML  SOLN COMPARISON:  04/29/2020  FINDINGS: Lower chest: Scarring in the lung bases. Small esophageal hiatal hernia. Hepatobiliary: No focal liver abnormality is seen. Status post cholecystectomy. No biliary dilatation. Pancreas: Unremarkable. No pancreatic ductal dilatation or surrounding inflammatory changes. Spleen: Normal in size without focal abnormality. Adrenals/Urinary Tract: Adrenal glands are unremarkable. 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 Kidneys are otherwise normal, without renal calculi, focal lesion, or hydronephrosis. Bladder is unremarkable. Stomach/Bowel: Stomach, small bowel, and colon are not abnormally distended. Scattered colonic diverticula without evidence of acute diverticulitis. No wall thickening or inflammatory changes. Appendix is not identified. Vascular/Lymphatic: Aortic atherosclerosis. No enlarged abdominal or pelvic lymph nodes. Reproductive: Uterus and bilateral adnexa are unremarkable. Other: No abdominal wall hernia or abnormality. No abdominopelvic ascites. Mesh hernia repair of the anterior abdominal wall. Musculoskeletal: No acute or significant osseous findings. IMPRESSION: 1. No evidence of bowel obstruction or inflammation. 2. Small esophageal hiatal hernia. 3. Aortic atherosclerosis. Electronically Signed   By: Lucienne Capers M.D.   On: 03/24/2022 23:25   DG Chest Portable 1 View  Result Date: 03/24/2022 CLINICAL DATA:  Dyspnea EXAM: PORTABLE CHEST 1 VIEW COMPARISON:  03/24/2022 FINDINGS: Lungs are clear. No pneumothorax or pleural effusion. Right internal jugular chest port tip is seen at the superior cavoatrial junction. Cardiac size within normal limits.  Pulmonary vascularity is normal. IMPRESSION: No active disease. Electronically Signed   By: Fidela Salisbury M.D.   On: 03/24/2022 23:02   DG Chest Port 1 View  Result Date: 03/24/2022 CLINICAL DATA:  Weakness EXAM: PORTABLE CHEST 1 VIEW COMPARISON:  07/06/2019 FINDINGS: The heart size and mediastinal contours are within normal  limits. Bibasilar atelectasis. The visualized skeletal structures are unremarkable. Right chest wall Port-A-Cath tip at the cavoatrial junction. IMPRESSION: Bibasilar atelectasis. Electronically Signed   By: Ulyses Jarred M.D.   On: 03/24/2022 00:50   MR CERVICAL SPINE W WO CONTRAST  Result Date: 03/14/2022 CLINICAL DATA:  Ataxia EXAM: MRI CERVICAL SPINE WITHOUT AND WITH CONTRAST TECHNIQUE: Multiplanar and multiecho pulse sequences of the cervical spine, to include the craniocervical junction and cervicothoracic junction, were obtained without and with intravenous contrast. CONTRAST:  57mL GADAVIST GADOBUTROL 1 MMOL/ML IV SOLN COMPARISON:  None. FINDINGS: Alignment: Physiologic. Vertebrae: No fracture, evidence of discitis, or bone lesion. Cord: Normal signal and morphology. Posterior Fossa, vertebral arteries, paraspinal tissues: Negative. Disc levels: C1-2: Unremarkable. C2-3: Normal disc space and facet joints. There is no spinal canal stenosis. No neural foraminal stenosis. C3-4: Normal disc space and facet joints. There is no spinal canal stenosis. No neural foraminal stenosis. C4-5: Disc space narrowing with small disc osteophyte complex. There is no spinal canal stenosis. No neural foraminal stenosis. C5-6: Large right uncovertebral osteophyte superimposed on small disc bulge. There is no spinal canal stenosis. Severe right neural foraminal stenosis. C6-7: Normal disc space and facet joints. There is no spinal canal stenosis. No neural foraminal stenosis. C7-T1: Normal disc space and facet joints. There is no spinal canal stenosis. No neural foraminal stenosis. IMPRESSION: 1. No acute abnormality of the cervical spine. 2. Severe right C5-6 neural foraminal stenosis secondary to large right uncovertebral osteophyte. 3. Mild cervical degenerative disc disease without spinal canal stenosis. Electronically Signed   By: Ulyses Jarred M.D.   On: 03/14/2022 23:14   US Venous Img Lower Bilateral (DVT)  Result  Date: 03/14/2022 CLINICAL DATA:  Pulmonary embolism. EXAM: BILATERAL LOWER EXTREMITY VENOUS DOPPLER ULTRASOUND TECHNIQUE: Gray-scale sonography with graded compression, as well as color Doppler and duplex ultrasound were performed to evaluate the lower extremity deep venous systems from the level of the common femoral vein and including the common femoral, femoral, profunda femoral, popliteal and calf veins including the posterior tibial, peroneal and gastrocnemius veins when visible. The superficial great saphenous vein was also interrogated. Spectral Doppler was utilized to evaluate flow at rest and with distal augmentation maneuvers in the common femoral, femoral and popliteal veins. COMPARISON:  None. FINDINGS: RIGHT LOWER EXTREMITY Common Femoral Vein: No evidence of thrombus. Normal compressibility, respiratory phasicity and response to augmentation. Saphenofemoral Junction: No evidence of thrombus. Normal compressibility and flow on color Doppler imaging. Profunda Femoral Vein: No evidence of thrombus. Normal compressibility and flow on color Doppler imaging. Femoral Vein: No evidence of thrombus. Normal compressibility, respiratory phasicity and response to augmentation. Popliteal Vein: No evidence of thrombus. Normal compressibility, respiratory phasicity and response to augmentation. Calf Veins: No evidence of thrombus. Normal compressibility and flow on color Doppler imaging. Superficial Great Saphenous Vein: No evidence of thrombus. Normal compressibility. Venous Reflux:  None. Other Findings: No evidence of superficial thrombophlebitis or abnormal fluid collection. LEFT LOWER EXTREMITY Common Femoral Vein: No evidence of thrombus. Normal compressibility, respiratory phasicity and response to augmentation. Saphenofemoral Junction: No evidence of thrombus. Normal compressibility and flow on color Doppler imaging. Profunda Femoral Vein: No evidence of thrombus.  Normal compressibility and flow on color  Doppler imaging. Femoral Vein: No evidence of thrombus. Normal compressibility, respiratory phasicity and response to augmentation. Popliteal Vein: No evidence of thrombus. Normal compressibility, respiratory phasicity and response to augmentation. Calf Veins: No evidence of thrombus. Normal compressibility and flow on color Doppler imaging. Superficial Great Saphenous Vein: No evidence of thrombus. Normal compressibility. Venous Reflux:  None. Other Findings: No evidence of superficial thrombophlebitis or abnormal fluid collection. IMPRESSION: No evidence of deep venous thrombosis in either lower extremity. Electronically Signed   By: Aletta Edouard M.D.   On: 03/14/2022 11:50   MR LUMBAR SPINE W WO CONTRAST  Result Date: 03/13/2022 CLINICAL DATA:  Acute myelopathy EXAM: MRI THORACIC AND LUMBAR SPINE WITHOUT AND WITH CONTRAST TECHNIQUE: Multiplanar and multiecho pulse sequences of the thoracic and lumbar spine were obtained without and with intravenous contrast. CONTRAST:  79mL GADAVIST GADOBUTROL 1 MMOL/ML IV SOLN COMPARISON:  None. FINDINGS: MRI THORACIC SPINE FINDINGS Alignment:  Physiologic. Vertebrae: There are old compression fractures of T4 and T7, unchanged. No acute fracture. Cord:  Normal signal and morphology. Paraspinal and other soft tissues: Negative. Disc levels: Mild midthoracic degenerative disease without spinal canal stenosis. There is a small central disc protrusion at T7-8 and a small left subarticular disc protrusion at T8-9 MRI LUMBAR SPINE FINDINGS Segmentation:  Standard. Alignment:  Physiologic. Vertebrae:  No fracture, evidence of discitis, or bone lesion. Conus medullaris: Extends to the L1 level and appears normal. Paraspinal and other soft tissues: Negative. Disc levels: IMPRESSION: 1. No acute abnormality of the thoracic or lumbar spine. 2. Unchanged mild thoracic degenerative disc disease without spinal canal or neural foraminal stenosis. 3. Unchanged old compression fractures of  T4 and T7. Electronically Signed   By: Ulyses Jarred M.D.   On: 03/13/2022 22:18   MR THORACIC SPINE W WO CONTRAST  Result Date: 03/13/2022 CLINICAL DATA:  Acute myelopathy EXAM: MRI THORACIC AND LUMBAR SPINE WITHOUT AND WITH CONTRAST TECHNIQUE: Multiplanar and multiecho pulse sequences of the thoracic and lumbar spine were obtained without and with intravenous contrast. CONTRAST:  50mL GADAVIST GADOBUTROL 1 MMOL/ML IV SOLN COMPARISON:  None. FINDINGS: MRI THORACIC SPINE FINDINGS Alignment:  Physiologic. Vertebrae: There are old compression fractures of T4 and T7, unchanged. No acute fracture. Cord:  Normal signal and morphology. Paraspinal and other soft tissues: Negative. Disc levels: Mild midthoracic degenerative disease without spinal canal stenosis. There is a small central disc protrusion at T7-8 and a small left subarticular disc protrusion at T8-9 MRI LUMBAR SPINE FINDINGS Segmentation:  Standard. Alignment:  Physiologic. Vertebrae:  No fracture, evidence of discitis, or bone lesion. Conus medullaris: Extends to the L1 level and appears normal. Paraspinal and other soft tissues: Negative. Disc levels: IMPRESSION: 1. No acute abnormality of the thoracic or lumbar spine. 2. Unchanged mild thoracic degenerative disc disease without spinal canal or neural foraminal stenosis. 3. Unchanged old compression fractures of T4 and T7. Electronically Signed   By: Ulyses Jarred M.D.   On: 03/13/2022 22:18   CT Angio Chest Pulmonary Embolism (PE) W or WO Contrast  Addendum Date: 03/13/2022   ADDENDUM REPORT: 03/13/2022 03:38 ADDENDUM: Results discussed over the phone with Dr. Dolly Rias at 3:20 a.m., 03/13/2022, with acknowledgement of findings. Electronically Signed   By: Telford Nab M.D.   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 EXAM: CT ANGIOGRAPHY CHEST WITH CONTRAST TECHNIQUE: Multidetector  CT imaging  of the chest was performed using the standard protocol during bolus administration of intravenous contrast. Multiplanar CT image reconstructions and MIPs were obtained to evaluate the vascular anatomy. RADIATION DOSE REDUCTION: This exam was performed according to the departmental dose-optimization program which includes automated exposure control, adjustment of the mA and/or kV according to patient size and/or use of iterative reconstruction technique. CONTRAST:  21mL OMNIPAQUE IOHEXOL 350 MG/ML SOLN COMPARISON:  CT studies of chest, abdomen and pelvis with contrast 10/17/2021 and 06/16/2021. FINDINGS: Cardiovascular: Right chest port with IJ approach catheter, again terminating about the superior cavoatrial junction. The cardiac size is normal. There is moderate to heavy atherosclerosis in the aorta and great vessels, heavy three-vessel coronary artery atherosclerosis. There is no pericardial effusion. The pulmonary veins are decompressed. There is no aortic aneurysm or dissection. 70% mixed plaque stenosis in the proximal left subclavian artery is unchanged. Although the pulmonary arteries are within normal caliber limits, there is a linear thrombus within the right main pulmonary artery which is nonoccluding, with occluding thrombus extending from this into the right upper lobe main artery and the posterior and apical right upper lobe segmental arteries, and into the anterior segmental artery with nonoccluding thrombus. There are multiple subsegmental right upper lobe apical and posterior segment arteries showing lack of opacification which is probably due to thrombus but the and vessels are very small difficult to assess at the subsegmental level. There is additional non occluding thrombus in the interlobar pulmonary artery, in the right middle lobe medial and lateral segmental arteries, and in the right lower lobe main and basal segmental and multiple subsegmental arteries. On the left nonoccluding  embolus is seen in the lingular main and lateral and medial segmental arteries. The remainder of the left lung arteries free of thrombus. There are no findings of acute right heart strain at this time. Mediastinum/Nodes: No enlarged mediastinal, hilar, or axillary lymph nodes. Thyroid gland, trachea, and esophagus demonstrate no significant findings. There is a small hiatal hernia. There are calcified subcarinal lymph nodes. Lungs/Pleura: The lungs are mildly emphysematous with centrilobular and paraseptal emphysematous changes predominating in the upper lobes. There are increased posterior basal opacities in the right lower lobe which are suspected due to pulmonary infarctions. There is increased band atelectasis in the anterior basal right lower lobe segment. Scattered calcified granulomas again noted in the right. There is no pleural effusion, thickening or pneumothorax. Mild central bronchial thickening continues to be seen in the lower lobes, apparently chronic, without mucous plugging. There is a chronic atelectasis or fibrotic consolidation in the lingular base. The lungs are otherwise generally clear. No noncalcified nodule is seen. There are few linear scar-like opacities in both bases. Upper Abdomen: No acute abnormality. Musculoskeletal: Chronic severe T7 and moderate T4 compression fractures are redemonstrated, with kyphoplasty cement at T7. There is osteopenia and degenerative change, thoracic kyphodextroscoliosis without new abnormality. There is no destructive osseous or lytic lesion. there is a chronic sternal fracture of the Superior body of the sternum. Review of the MIP images confirms the above findings. IMPRESSION: 1. Bilateral arterial emboli right-greater-than-left with overall moderate clot burden. No findings of acute right heart strain at this time. 2. Opacities in the posterior basal right lower lobe most likely due to evolving infarctions. 3. COPD.  No suspicious nodule.  Old  granulomatous calcifications. 4. Aortic, great vessel and coronary artery atherosclerosis. No aneurysm or dissection. 5. 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 Electronically Signed: By: Telford Nab M.D. On:  03/13/2022 03:17   MR BRAIN W WO CONTRAST  Result Date: 03/13/2022 CLINICAL DATA:  Acute neurologic deficit EXAM: MRI HEAD WITHOUT AND WITH CONTRAST TECHNIQUE: Multiplanar, multiecho pulse sequences of the brain and surrounding structures were obtained without and with intravenous contrast. CONTRAST:  70mL GADAVIST GADOBUTROL 1 MMOL/ML IV SOLN COMPARISON:  None. FINDINGS: Brain: No acute infarct, mass effect or extra-axial collection. No acute or chronic hemorrhage. Confluent hyperintense T2-weighted white matter signal. Generalized volume loss without a clear lobar predilection. Old bilateral cerebellar infarcts. The midline structures are normal. There is no abnormal contrast enhancement. Vascular: Major flow voids are preserved. Skull and upper cervical spine: Normal calvarium and skull base. Visualized upper cervical spine and soft tissues are normal. Sinuses/Orbits:No paranasal sinus fluid levels or advanced mucosal thickening. No mastoid or middle ear effusion. Normal orbits. IMPRESSION: 1. No acute intracranial abnormality. 2. 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 Electronically Signed   By: Ulyses Jarred M.D.   On: 03/13/2022 02:52   CT ANGIO HEAD NECK W WO CM  Result Date: 03/13/2022 CLINICAL DATA:  Acute neurologic deficit EXAM: CT ANGIOGRAPHY HEAD AND NECK TECHNIQUE: Multidetector CT imaging of the head and neck was performed using the standard protocol during bolus administration of intravenous contrast. Multiplanar CT image reconstructions and MIPs were obtained to evaluate the vascular anatomy. Carotid stenosis measurements (when applicable) are obtained utilizing NASCET criteria, using the distal internal  carotid diameter as the denominator. RADIATION DOSE REDUCTION: This exam was performed according to the departmental dose-optimization program which includes automated exposure control, adjustment of the mA and/or kV according to patient size and/or use of iterative reconstruction technique. CONTRAST:  67mL OMNIPAQUE IOHEXOL 350 MG/ML SOLN COMPARISON:  None. FINDINGS: CTA NECK FINDINGS SKELETON: There is no bony spinal canal stenosis. No lytic or blastic lesion. OTHER NECK: Normal pharynx, larynx and major salivary glands. No cervical lymphadenopathy. Unremarkable thyroid gland. UPPER CHEST: There is a pulmonary embolus within the main right pulmonary artery, extending into the right upper lobar branch. There are small emboli visible in the lingula. AORTIC ARCH: There is calcific atherosclerosis of the aortic arch. There is no aneurysm, dissection or hemodynamically significant stenosis of the visualized portion of the aorta. Conventional 3 vessel aortic branching pattern. 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 RIGHT CAROTID SYSTEM: No dissection, occlusion or aneurysm. There is mixed density atherosclerosis extending into the proximal ICA, resulting in less than 50% stenosis. LEFT CAROTID SYSTEM: No dissection, occlusion or aneurysm. There is calcified atherosclerosis extending into the proximal ICA, resulting in less than 50% stenosis. VERTEBRAL ARTERIES: Right dominant configuration. The left V1 and proximal V2 segments are occluded. The right vertebral artery is normal. There is opacification of the distal left vertebral artery beginning at approximately the C2 level. CTA HEAD FINDINGS POSTERIOR CIRCULATION: --Vertebral arteries: Normal V4 segments. --Inferior cerebellar arteries: Normal. --Basilar artery: Normal. --Superior cerebellar arteries: Normal. --Posterior cerebral arteries (PCA): Normal. ANTERIOR CIRCULATION: --Intracranial internal carotid arteries: Normal. --Anterior cerebral  arteries (ACA): Normal. Both A1 segments are present. Patent anterior communicating artery (a-comm). --Middle cerebral arteries (MCA): Normal. VENOUS SINUSES: As permitted by contrast timing, patent. ANATOMIC VARIANTS: Fetal origins of both posterior cerebral arteries. Review of the MIP images confirms the above findings. IMPRESSION: 1. No intracranial arterial occlusion or high-grade stenosis. 2. 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 3. 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 4. Pulmonary emboli within the  main right pulmonary artery, extending into the right upper lobar branch. Small emboli in the lingula. Aortic Atherosclerosis (ICD10-I70.0). Critical Value/emergent results were called by telephone at the time of interpretation on 03/13/2022 at 2:15 am to provider [facility] , who verbally acknowledged these results. Electronically Signed   By: Ulyses Jarred M.D.   On: 03/13/2022 02:16   CT Head Wo Contrast  Result Date: 03/12/2022 CLINICAL DATA:  Decreased mobility EXAM: CT HEAD WITHOUT CONTRAST TECHNIQUE: Contiguous axial images were obtained from the base of the skull through the vertex without intravenous contrast. RADIATION DOSE REDUCTION: This exam was performed according to the departmental dose-optimization program which includes automated exposure control, adjustment of the mA and/or kV according to patient size and/or use of iterative reconstruction technique. COMPARISON:  MRI 11/21/2021, CT brain 01/19/2020 FINDINGS: Brain: No acute territorial infarction, hemorrhage or intracranial mass. Extensive white matter hypodensity as before corresponding to history of prior brain radiation. 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 Atrophy. Stable ventricle size. Vascular: No hyperdense vessels.  Carotid vascular calcification Skull: Normal. Negative for fracture or focal lesion.  Sinuses/Orbits: No acute finding. Other: None IMPRESSION: 1. No definite CT evidence for acute intracranial abnormality 2. Multiple small chronic infarcts in the cerebellum bilaterally 3. Similar appearance of atrophy and extensive white matter hypodensity. Electronically Signed   By: Donavan Foil M.D.   On: 03/12/2022 21:20      ASSESSMENT & PLAN:  1. Diarrhea, unspecified type   2. Small cell lung cancer ([place])   3. Cerebrovascular accident (CVA), unspecified mechanism ([place])   4. History of pulmonary embolism   5. 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   Currently on Tecentriq maintenance. Labs reviewed and discussed with patient Hold off Tecentriq today. Check CT chest abdomen pelvis with IV contrast only in the end of June.  #Intermittent diarrhea, check C. difficile and GI panel. If negative, recommend patient to utilize Imodium as needed.  # CVA, follow up with neurology.  Continue aspirin 81 mg daily. #Acute pulmonary embolism, likely provoked due to bacteremia/immobility secondary to weakness.  Continue Eliquis 5 mg twice daily.  . # History bone metastasis and Osteopenia  zometa every 4-6 weeks. .  Continue calcium supplementation..  Hold Zometa today.  Follow up TBD, depending on stool studies.  Earlie Server, MD, PhD

## 2022-06-02 DIAGNOSIS — K219 Gastro-esophageal reflux disease without esophagitis: Secondary | ICD-10-CM | POA: Diagnosis not present

## 2022-06-02 DIAGNOSIS — C3402 Malignant neoplasm of left main bronchus: Secondary | ICD-10-CM | POA: Diagnosis not present

## 2022-06-02 DIAGNOSIS — F40240 Claustrophobia: Secondary | ICD-10-CM | POA: Diagnosis not present

## 2022-06-02 DIAGNOSIS — I959 Hypotension, unspecified: Secondary | ICD-10-CM | POA: Diagnosis not present

## 2022-06-02 DIAGNOSIS — K5900 Constipation, unspecified: Secondary | ICD-10-CM | POA: Diagnosis not present

## 2022-06-02 DIAGNOSIS — I25118 Atherosclerotic heart disease of native coronary artery with other forms of angina pectoris: Secondary | ICD-10-CM | POA: Diagnosis not present

## 2022-06-02 DIAGNOSIS — E7800 Pure hypercholesterolemia, unspecified: Secondary | ICD-10-CM | POA: Diagnosis not present

## 2022-06-02 DIAGNOSIS — M8580 Other specified disorders of bone density and structure, unspecified site: Secondary | ICD-10-CM | POA: Diagnosis not present

## 2022-06-02 DIAGNOSIS — R1310 Dysphagia, unspecified: Secondary | ICD-10-CM | POA: Diagnosis not present

## 2022-06-02 DIAGNOSIS — C7931 Secondary malignant neoplasm of brain: Secondary | ICD-10-CM | POA: Diagnosis not present

## 2022-06-02 DIAGNOSIS — G8929 Other chronic pain: Secondary | ICD-10-CM | POA: Diagnosis not present

## 2022-06-02 DIAGNOSIS — I4891 Unspecified atrial fibrillation: Secondary | ICD-10-CM | POA: Diagnosis not present

## 2022-06-02 DIAGNOSIS — D630 Anemia in neoplastic disease: Secondary | ICD-10-CM | POA: Diagnosis not present

## 2022-06-02 DIAGNOSIS — I252 Old myocardial infarction: Secondary | ICD-10-CM | POA: Diagnosis not present

## 2022-06-02 DIAGNOSIS — M8448XD Pathological fracture, other site, subsequent encounter for fracture with routine healing: Secondary | ICD-10-CM | POA: Diagnosis not present

## 2022-06-02 DIAGNOSIS — I700 Atherosclerosis of aorta: Secondary | ICD-10-CM | POA: Diagnosis not present

## 2022-06-02 DIAGNOSIS — R7303 Prediabetes: Secondary | ICD-10-CM | POA: Diagnosis not present

## 2022-06-02 DIAGNOSIS — K589 Irritable bowel syndrome without diarrhea: Secondary | ICD-10-CM | POA: Diagnosis not present

## 2022-06-02 DIAGNOSIS — E559 Vitamin D deficiency, unspecified: Secondary | ICD-10-CM | POA: Diagnosis not present

## 2022-06-02 DIAGNOSIS — I119 Hypertensive heart disease without heart failure: Secondary | ICD-10-CM | POA: Diagnosis not present

## 2022-06-02 DIAGNOSIS — E876 Hypokalemia: Secondary | ICD-10-CM | POA: Diagnosis not present

## 2022-06-02 DIAGNOSIS — M5030 Other cervical disc degeneration, unspecified cervical region: Secondary | ICD-10-CM | POA: Diagnosis not present

## 2022-06-02 DIAGNOSIS — M1711 Unilateral primary osteoarthritis, right knee: Secondary | ICD-10-CM | POA: Diagnosis not present

## 2022-06-02 DIAGNOSIS — I2699 Other pulmonary embolism without acute cor pulmonale: Secondary | ICD-10-CM | POA: Diagnosis not present

## 2022-06-02 DIAGNOSIS — G3184 Mild cognitive impairment, so stated: Secondary | ICD-10-CM | POA: Diagnosis not present

## 2022-06-03 DIAGNOSIS — I119 Hypertensive heart disease without heart failure: Secondary | ICD-10-CM | POA: Diagnosis not present

## 2022-06-03 DIAGNOSIS — I959 Hypotension, unspecified: Secondary | ICD-10-CM | POA: Diagnosis not present

## 2022-06-03 DIAGNOSIS — I25118 Atherosclerotic heart disease of native coronary artery with other forms of angina pectoris: Secondary | ICD-10-CM | POA: Diagnosis not present

## 2022-06-03 DIAGNOSIS — E7800 Pure hypercholesterolemia, unspecified: Secondary | ICD-10-CM | POA: Diagnosis not present

## 2022-06-03 DIAGNOSIS — E876 Hypokalemia: Secondary | ICD-10-CM | POA: Diagnosis not present

## 2022-06-03 DIAGNOSIS — I252 Old myocardial infarction: Secondary | ICD-10-CM | POA: Diagnosis not present

## 2022-06-04 ENCOUNTER — Other Ambulatory Visit: Payer: Self-pay | Admitting: Oncology

## 2022-06-04 MED ORDER — APIXABAN 5 MG PO TABS: 5.0000 mg | ORAL_TABLET | Freq: Two times a day (BID) | ORAL | 1 refills | Status: DC

## 2022-06-09 ENCOUNTER — Telehealth: Payer: Self-pay | Admitting: Internal Medicine

## 2022-06-09 NOTE — Telephone Encounter (Signed)
Copied from [place] [id number]. Topic: Quick Communication - Home Health Verbal Orders >> Jun 09, 2022  3:21 PM Leilani Able wrote: Caller/Agency: [facility]/ [facility] Number: [phone] Requesting PT for 1 time a wk for 5 wk and then once every other week for 4 wk

## 2022-06-09 NOTE — Telephone Encounter (Signed)
Left voice mail for patient to call back to set up appointment

## 2022-06-10 DIAGNOSIS — I959 Hypotension, unspecified: Secondary | ICD-10-CM | POA: Diagnosis not present

## 2022-06-10 DIAGNOSIS — I119 Hypertensive heart disease without heart failure: Secondary | ICD-10-CM | POA: Diagnosis not present

## 2022-06-10 DIAGNOSIS — I25118 Atherosclerotic heart disease of native coronary artery with other forms of angina pectoris: Secondary | ICD-10-CM | POA: Diagnosis not present

## 2022-06-10 DIAGNOSIS — I252 Old myocardial infarction: Secondary | ICD-10-CM | POA: Diagnosis not present

## 2022-06-10 DIAGNOSIS — E7800 Pure hypercholesterolemia, unspecified: Secondary | ICD-10-CM | POA: Diagnosis not present

## 2022-06-10 DIAGNOSIS — E876 Hypokalemia: Secondary | ICD-10-CM | POA: Diagnosis not present

## 2022-06-15 ENCOUNTER — Ambulatory Visit
Admission: RE | Admit: 2022-06-15 | Discharge: 2022-06-15 | Disposition: A | Discharge status: Home / Self Care | Payer: Medicare Other | Source: Ambulatory Visit | Attending: Oncology | Admitting: Oncology

## 2022-06-15 DIAGNOSIS — J432 Centrilobular emphysema: Secondary | ICD-10-CM | POA: Diagnosis not present

## 2022-06-15 DIAGNOSIS — S2222XA Fracture of body of sternum, initial encounter for closed fracture: Secondary | ICD-10-CM | POA: Diagnosis not present

## 2022-06-15 DIAGNOSIS — I700 Atherosclerosis of aorta: Secondary | ICD-10-CM | POA: Diagnosis not present

## 2022-06-15 DIAGNOSIS — C3490 Malignant neoplasm of unspecified part of unspecified bronchus or lung: Secondary | ICD-10-CM | POA: Diagnosis not present

## 2022-06-15 DIAGNOSIS — I2510 Atherosclerotic heart disease of native coronary artery without angina pectoris: Secondary | ICD-10-CM | POA: Diagnosis not present

## 2022-06-15 DIAGNOSIS — C7931 Secondary malignant neoplasm of brain: Secondary | ICD-10-CM | POA: Diagnosis not present

## 2022-06-15 MED ORDER — IOHEXOL 300 MG/ML  SOLN
100.0000 mL | Freq: Once | INTRAMUSCULAR | Status: AC | PRN
  Administered 2022-06-15: 100 mL via INTRAVENOUS

## 2022-06-16 ENCOUNTER — Telehealth: Payer: Self-pay

## 2022-06-17 DIAGNOSIS — E876 Hypokalemia: Secondary | ICD-10-CM | POA: Diagnosis not present

## 2022-06-17 DIAGNOSIS — I119 Hypertensive heart disease without heart failure: Secondary | ICD-10-CM | POA: Diagnosis not present

## 2022-06-17 DIAGNOSIS — I959 Hypotension, unspecified: Secondary | ICD-10-CM | POA: Diagnosis not present

## 2022-06-17 DIAGNOSIS — I252 Old myocardial infarction: Secondary | ICD-10-CM | POA: Diagnosis not present

## 2022-06-17 DIAGNOSIS — I25118 Atherosclerotic heart disease of native coronary artery with other forms of angina pectoris: Secondary | ICD-10-CM | POA: Diagnosis not present

## 2022-06-17 DIAGNOSIS — E7800 Pure hypercholesterolemia, unspecified: Secondary | ICD-10-CM | POA: Diagnosis not present

## 2022-06-18 ENCOUNTER — Encounter: Payer: Self-pay | Admitting: Oncology

## 2022-06-18 NOTE — Telephone Encounter (Signed)
Kristi [name], please schedule patient for lab/MD/tecentriq week of 7/24. Please notify patient of appt. Thanks.

## 2022-06-18 NOTE — Telephone Encounter (Signed)
Called patient and she states that her diarrhea subsided 2 days ago. Informed her we will call her back to schedule her follow up. Patient verbalized understanding

## 2022-06-21 ENCOUNTER — Other Ambulatory Visit: Payer: Self-pay

## 2022-06-21 ENCOUNTER — Emergency Department
Admission: EM | Admit: 2022-06-21 | Discharge: 2022-06-21 | Disposition: A | Discharge status: Home / Self Care | Payer: Medicare Other | Attending: Emergency Medicine | Admitting: Emergency Medicine

## 2022-06-21 DIAGNOSIS — R7989 Other specified abnormal findings of blood chemistry: Secondary | ICD-10-CM | POA: Diagnosis not present

## 2022-06-21 DIAGNOSIS — N390 Urinary tract infection, site not specified: Secondary | ICD-10-CM | POA: Insufficient documentation

## 2022-06-21 DIAGNOSIS — R531 Weakness: Secondary | ICD-10-CM | POA: Diagnosis not present

## 2022-06-21 DIAGNOSIS — R000 Tachycardia, unspecified: Secondary | ICD-10-CM | POA: Diagnosis not present

## 2022-06-21 LAB — CBC WITH DIFFERENTIAL/PLATELET
Abs Immature Granulocytes: 0.04 10*3/uL (ref 0.00–0.07)
Basophils Absolute: 0.1 10*3/uL (ref 0.0–0.1)
Basophils Relative: 1 %
Eosinophils Absolute: 0.3 10*3/uL (ref 0.0–0.5)
Eosinophils Relative: 4 %
HCT: 39 % (ref 36.0–46.0)
Hemoglobin: 12.5 g/dL (ref 12.0–15.0)
Immature Granulocytes: 1 %
Lymphocytes Relative: 21 %
Lymphs Abs: 1.6 10*3/uL (ref 0.7–4.0)
MCH: 28.1 pg (ref 26.0–34.0)
MCHC: 32.1 g/dL (ref 30.0–36.0)
MCV: 87.6 fL (ref 80.0–100.0)
Monocytes Absolute: 1.1 10*3/uL — ABNORMAL HIGH (ref 0.1–1.0)
Monocytes Relative: 14 %
Neutro Abs: 4.7 10*3/uL (ref 1.7–7.7)
Neutrophils Relative %: 59 %
Platelets: 214 10*3/uL (ref 150–400)
RBC: 4.45 MIL/uL (ref 3.87–5.11)
RDW: 13.4 % (ref 11.5–15.5)
WBC: 7.9 10*3/uL (ref 4.0–10.5)
nRBC: 0 % (ref 0.0–0.2)

## 2022-06-21 LAB — URINALYSIS, ROUTINE W REFLEX MICROSCOPIC
Bilirubin Urine: NEGATIVE
Glucose, UA: NEGATIVE mg/dL
Ketones, ur: NEGATIVE mg/dL
Leukocytes,Ua: NEGATIVE
Nitrite: NEGATIVE
Protein, ur: 30 mg/dL — AB
Specific Gravity, Urine: 1.015 (ref 1.005–1.030)
pH: 8 (ref 5.0–8.0)

## 2022-06-21 LAB — COMPREHENSIVE METABOLIC PANEL
ALT: 12 U/L (ref 0–44)
AST: 15 U/L (ref 15–41)
Albumin: 3.9 g/dL (ref 3.5–5.0)
Alkaline Phosphatase: 74 U/L (ref 38–126)
Anion gap: 12 (ref 5–15)
BUN: 20 mg/dL (ref 8–23)
CO2: 22 mmol/L (ref 22–32)
Calcium: 9.4 mg/dL (ref 8.9–10.3)
Chloride: 104 mmol/L (ref 98–111)
Creatinine, Ser: 1.1 mg/dL — ABNORMAL HIGH (ref 0.44–1.00)
GFR, Estimated: 52 mL/min — ABNORMAL LOW (ref >60)
Glucose, Bld: 108 mg/dL — ABNORMAL HIGH (ref 70–99)
Potassium: 3.9 mmol/L (ref 3.5–5.1)
Sodium: 138 mmol/L (ref 135–145)
Total Bilirubin: 1 mg/dL (ref 0.3–1.2)
Total Protein: 7.9 g/dL (ref 6.5–8.1)

## 2022-06-21 LAB — TROPONIN I (HIGH SENSITIVITY): Troponin I (High Sensitivity): 4 ng/L (ref <18)

## 2022-06-21 LAB — TSH: TSH: 1.674 u[IU]/mL (ref 0.350–4.500)

## 2022-06-21 LAB — LACTIC ACID, PLASMA: Lactic Acid, Venous: 1.2 mmol/L (ref 0.5–1.9)

## 2022-06-21 MED ORDER — CEPHALEXIN 500 MG PO CAPS: 500.0000 mg | ORAL_CAPSULE | Freq: Four times a day (QID) | ORAL | 0 refills | Status: AC

## 2022-06-21 MED ORDER — SODIUM CHLORIDE 0.9 % IV SOLN
1.0000 g | Freq: Once | INTRAVENOUS | Status: AC
  Administered 2022-06-21: 1 g via INTRAVENOUS
  Filled 2022-06-21: qty 10

## 2022-06-21 NOTE — ED Provider Notes (Signed)
[facility] Provider Note    Event Date/Time   First MD Initiated Contact with Patient 06/21/22 2012     (approximate)   History   Weakness   HPI  Kristi [name] is a 76 y.o. female who presents to the emergency department today because of concerns for weakness.  Patient is unable to give good history so some history is obtained from family at bedside.  They state that the patient has been getting progressively weak.  It does somewhat come and go although today she was having a hard time even getting out of chair.  Weakness is primarily in the lower extremities.  The patient has not had any fevers.  Family states that she does not have very good oral intake and has not drinking as much has been recommended.  Physical Exam   Triage Vital Signs: ED Triage Vitals  Enc Vitals Group     BP 06/21/22 1922 96/80     Pulse Rate 06/21/22 1922 (!) 101     Resp 06/21/22 1922 18     Temp 06/21/22 1922 (!) 97.5 F (36.4 C)     Temp Source 06/21/22 1922 Oral     SpO2 06/21/22 1922 95 %     Weight 06/21/22 1922 133 lb (60.3 kg)     Height 06/21/22 1922 5\' 7"  (1.702 m)     Head Circumference --      Peak Flow --      Pain Score 06/21/22 1929 0     Pain Loc --      Pain Edu? --      Excl. in [place]? --     Most recent vital signs: Vitals:   06/21/22 1922  BP: 96/80  Pulse: (!) 101  Resp: 18  Temp: (!) 97.5 F (36.4 C)  SpO2: 95%   General: Awake, alert, not completely oriented. CV:  Good peripheral perfusion. Regular rate and rhythm. Resp:  Normal effort.  Abd:  No distention.    ED Results / Procedures / Treatments   Labs (all labs ordered are listed, but only abnormal results are displayed) Labs Reviewed  CBC WITH DIFFERENTIAL/PLATELET - Abnormal; Notable for the following components:      Result Value   Monocytes Absolute 1.1 (*)    All other components within normal limits  COMPREHENSIVE METABOLIC PANEL - Abnormal; Notable for the following  components:   Glucose, Bld 108 (*)    Creatinine, Ser 1.10 (*)    GFR, Estimated 52 (*)    All other components within normal limits  LACTIC ACID, PLASMA  URINALYSIS, ROUTINE W REFLEX MICROSCOPIC  LACTIC ACID, PLASMA  TROPONIN I (HIGH SENSITIVITY)     EKG  I, Nance Pear, attending physician, personally viewed and interpreted this EKG  EKG Time: 1928 Rate: 103 Rhythm: sinus tachycardia Axis: normal Intervals: qtc 445 QRS: narrow ST changes: no st elevation Impression: abnormal ekg  RADIOLOGY None   PROCEDURES:  Critical Care performed: No  Procedures   MEDICATIONS ORDERED IN ED: Medications - No data to display   IMPRESSION / MDM / [place] / ED COURSE  I reviewed the triage vital signs and the nursing notes.                              Differential diagnosis includes, but is not limited to, anemia, electrolyte abnormality, infection.  Patient's presentation is most consistent with acute presentation  with potential threat to life or bodily function.  Patient presented to the emergency department today because of concerns for increasing weakness.  Work-up here without any concerning leukocytosis or anemia.  Lactic acid level was normal.  Creatinine was very minimally elevated however not significantly over previous.  Urine is concerning for possible infection. Discussed with patient and family. Will give dose of antibiotics here in the emergency department and plan on discharging with further antibiotics.   FINAL CLINICAL IMPRESSION(S) / ED DIAGNOSES   Final diagnoses:  Weakness  Lower urinary tract infectious disease     Note:  This document was prepared using Dragon voice recognition software and may include unintentional dictation errors.    Nance Pear, MD 06/21/22 [phone]

## 2022-06-21 NOTE — ED Triage Notes (Signed)
Pt c/o weakness and being more off balance x 6-8 months. Pt husband reports sx exacerbated over past 2 weeks. 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 Pt husband also reports increase in confusion. Pt has chemo card with instructions for ED staff.

## 2022-06-21 NOTE — Discharge Instructions (Signed)
Please seek medical attention for any high fevers, chest pain, shortness of breath, change in behavior, persistent vomiting, bloody stool or any other new or concerning symptoms.  

## 2022-06-21 NOTE — ED Triage Notes (Signed)
FIRST NURSE NOTE:  Pt arrived via [facility] from home, pt here with progressive weakness over the past week, normally ambulates with a rolling walker, pt is baseline confused and is progressive per EMS.  BP 107/69 P-95 98%RA 98.3 temp

## 2022-07-13 ENCOUNTER — Inpatient Hospital Stay (HOSPITAL_BASED_OUTPATIENT_CLINIC_OR_DEPARTMENT_OTHER): Payer: Medicare Other | Admitting: Oncology

## 2022-07-13 ENCOUNTER — Encounter: Payer: Self-pay | Admitting: Oncology

## 2022-07-13 ENCOUNTER — Inpatient Hospital Stay: Payer: Medicare Other | Attending: Oncology

## 2022-07-13 ENCOUNTER — Inpatient Hospital Stay: Payer: Medicare Other

## 2022-07-13 VITALS — BP 105/71 | HR 82 | Temp 98.0°F | Wt 130.4 lb

## 2022-07-13 DIAGNOSIS — I639 Cerebral infarction, unspecified: Secondary | ICD-10-CM | POA: Diagnosis not present

## 2022-07-13 DIAGNOSIS — R197 Diarrhea, unspecified: Secondary | ICD-10-CM

## 2022-07-13 DIAGNOSIS — C3490 Malignant neoplasm of unspecified part of unspecified bronchus or lung: Secondary | ICD-10-CM | POA: Insufficient documentation

## 2022-07-13 DIAGNOSIS — Z79899 Other long term (current) drug therapy: Secondary | ICD-10-CM | POA: Insufficient documentation

## 2022-07-13 DIAGNOSIS — Z8673 Personal history of transient ischemic attack (TIA), and cerebral infarction without residual deficits: Secondary | ICD-10-CM | POA: Diagnosis not present

## 2022-07-13 DIAGNOSIS — Z8589 Personal history of malignant neoplasm of other organs and systems: Secondary | ICD-10-CM

## 2022-07-13 DIAGNOSIS — Z86711 Personal history of pulmonary embolism: Secondary | ICD-10-CM | POA: Insufficient documentation

## 2022-07-13 DIAGNOSIS — Z85830 Personal history of malignant neoplasm of bone: Secondary | ICD-10-CM | POA: Insufficient documentation

## 2022-07-13 DIAGNOSIS — Z87891 Personal history of nicotine dependence: Secondary | ICD-10-CM | POA: Insufficient documentation

## 2022-07-13 DIAGNOSIS — Z7982 Long term (current) use of aspirin: Secondary | ICD-10-CM | POA: Insufficient documentation

## 2022-07-13 DIAGNOSIS — Z7901 Long term (current) use of anticoagulants: Secondary | ICD-10-CM | POA: Insufficient documentation

## 2022-07-13 DIAGNOSIS — M8580 Other specified disorders of bone density and structure, unspecified site: Secondary | ICD-10-CM | POA: Diagnosis not present

## 2022-07-13 LAB — CBC WITH DIFFERENTIAL/PLATELET
Abs Immature Granulocytes: 0.04 10*3/uL (ref 0.00–0.07)
Basophils Absolute: 0.1 10*3/uL (ref 0.0–0.1)
Basophils Relative: 1 %
Eosinophils Absolute: 0.3 10*3/uL (ref 0.0–0.5)
Eosinophils Relative: 6 %
HCT: 35.8 % — ABNORMAL LOW (ref 36.0–46.0)
Hemoglobin: 11.8 g/dL — ABNORMAL LOW (ref 12.0–15.0)
Immature Granulocytes: 1 %
Lymphocytes Relative: 26 %
Lymphs Abs: 1.6 10*3/uL (ref 0.7–4.0)
MCH: 28.2 pg (ref 26.0–34.0)
MCHC: 33 g/dL (ref 30.0–36.0)
MCV: 85.6 fL (ref 80.0–100.0)
Monocytes Absolute: 0.7 10*3/uL (ref 0.1–1.0)
Monocytes Relative: 11 %
Neutro Abs: 3.3 10*3/uL (ref 1.7–7.7)
Neutrophils Relative %: 55 %
Platelets: 185 10*3/uL (ref 150–400)
RBC: 4.18 MIL/uL (ref 3.87–5.11)
RDW: 13.5 % (ref 11.5–15.5)
WBC: 6 10*3/uL (ref 4.0–10.5)
nRBC: 0 % (ref 0.0–0.2)

## 2022-07-13 LAB — COMPREHENSIVE METABOLIC PANEL
ALT: 8 U/L (ref 0–44)
AST: 25 U/L (ref 15–41)
Albumin: 3.7 g/dL (ref 3.5–5.0)
Alkaline Phosphatase: 58 U/L (ref 38–126)
Anion gap: 10 (ref 5–15)
BUN: 16 mg/dL (ref 8–23)
CO2: 21 mmol/L — ABNORMAL LOW (ref 22–32)
Calcium: 9.3 mg/dL (ref 8.9–10.3)
Chloride: 105 mmol/L (ref 98–111)
Creatinine, Ser: 1.03 mg/dL — ABNORMAL HIGH (ref 0.44–1.00)
GFR, Estimated: 57 mL/min — ABNORMAL LOW (ref >60)
Glucose, Bld: 79 mg/dL (ref 70–99)
Potassium: 3.9 mmol/L (ref 3.5–5.1)
Sodium: 136 mmol/L (ref 135–145)
Total Bilirubin: 0.5 mg/dL (ref 0.3–1.2)
Total Protein: 7.4 g/dL (ref 6.5–8.1)

## 2022-07-13 MED ORDER — OXYCODONE HCL 5 MG PO TABS: 5.0000 mg | ORAL_TABLET | Freq: Two times a day (BID) | ORAL | 0 refills | Status: DC | PRN

## 2022-07-13 MED ORDER — HEPARIN SOD (PORK) LOCK FLUSH 100 UNIT/ML IV SOLN
500.0000 [IU] | Freq: Once | INTRAVENOUS | Status: AC
  Administered 2022-07-13: 500 [IU] via INTRAVENOUS
  Filled 2022-07-13: qty 5

## 2022-07-13 MED ORDER — SODIUM CHLORIDE 0.9% FLUSH
10.0000 mL | INTRAVENOUS | Status: DC | PRN
  Administered 2022-07-13: 10 mL via INTRAVENOUS
  Filled 2022-07-13: qty 10

## 2022-07-13 NOTE — Progress Notes (Signed)
Hematology/Oncology Progress note [phone] [phone]      Patient Care Team: Glean Hess, MD as PCP - General (Internal Medicine) Charolette Forward, MD as Consulting Physician (Cardiology) Telford Nab, RN as Registered Nurse [name], MD as Radiation Oncologist (Radiation Oncology)   REASON FOR VISIT:  Follow-up for small cell lung cancer,   HISTORY OF PRESENTING ILLNESS:  Kristi [name] is a  76 y.o.  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  10/20/2018 CT chest with contrast showed large mediastinal mass involving both hilar, left greater than right, consistent with lung carcinoma, The mass causes narrowing of the left mainstem bronchus with resultant volume loss on the left and a mediastinal shift to the left.  Moderate size left pleural effusion. 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  # Initial MRI brain negative.  # Nov 2019- Jan 2020 s/p 4 cycles of Carbo/Etoposide/Tecentriq #  01/24/2019 interim CT scan done which showed continued positive response to therapy with continued reduction in mediastinal adenopathy.  No residual measurable left lung mass. CT findings of acute emphysematous cystitis. Urology did not feel that patient has pyelonephritis and recommend treatment with antibiotics because patient's immunocompromise. Patient finished treatment.   # 11/02/2018-01/09/2019 Chemotherapy carboplatin + Etoposide + tecentriq # Consolidation chest radiation and whole brain radiation finished in May 2020 # 04/25/2019 resume Tecentriq every 3 weeks. # Patient had a fall from her stairs on 01/19/2020. In the emergency room she had CT chest abdomen pelvis CT, CT head without contrast, CT cervical spine without contrast. No CT evidence for acute intracranial abnormality, degenerative changes of cervical spine..   No CT evidence of acute thoracic abdomen injury.  Severe compression fracture of T7, subacute.  # kyphoplasty procedure on 03/01/2020.  T7 biopsy negative for cancer.  11/20/2020 Surveillance CT scan  Reduced size and prominence of right lateral lung base subpleural nodular density. No findings of active malignancy.  MRI brain showed stable post treatment brain. No metastatic disease.   # 10/29/2021 MRI brain w and wo contrast showed two definite new enhancing lesions in cerebellum, with associated restricted diffusion and increased T2 signal, concerning for new foci of metastatic disease. 2. A third lesion in the right cerebellar hemisphere was likely present on the prior exam but continues to show limited contrast enhancement on SPGR; this lesion is associated with diffusion restriction and increased T2 signal, but, on coronal and sagittal T1 sequences, it correlates with an area affected by pulsation artifact. This is also felt to represent a new focus of metastatic disease.  11/20/2021 PET scan - no FDG avid disease in neck chest abdomen or pelvis.  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 No enhancing metastatic disease seen today, chronic small vessel ischemia.   12/19/2021 was seen by neurology Dr.Vaslow. increase ASA to 325mg  daily. Atorvastatin.  40 mg  03/13/2022 - 03/18/2022, patient was hospitalized due to lower extremity weakness CT head did not show any abnormality.  MRI brain confirmed no evidence of new CVA.  Patient was seen by neurologist and weakness was felt to be multifactorial. CT chest angiogram protocol showed bilateral pulmonary emboli, patient was started on heparin drip and transition to Xarelto at discharge. For 03/25/2022-4 /10/2022, patient was readmitted due to positive blood culture which was done during her  previous admission.  Blood culture was positive  for Streptococcus species in both bottles.  Patient had soft blood pressures initially and was given supportive care and IV antibiotics.  She completed antibiotic course.  At discharge, she was recommended to continue anticoagulation.  Both patient and husband are not aware about diagnosis of pulmonary embolism and not aware about new prescription of Xarelto. Xarelto appeared to be prescribed and prescription was printed out.  Patient and husband both not aware about the new prescription.  Patient has been off anticoagulation presumably since 03/31/2022.  Occasionally she has shortness of breath.  No chest pain.  She reports feeling 100% better today.   INTERVAL HISTORY Kristi [name] is a 76 y.o. female who has above history reviewed by me today presents for evaluation prior to immunotherapy for treatment of extensive small cell lung cancer  Diarrhea has improved.  Stool is formed, soft.  No loose bowel movements. Patient has home health physical therapy for mobility issue.  Appetite is fair.  Patient has lost 3 pounds since last visit .  Review of Systems  Constitutional:  Positive for fatigue. Negative for appetite change, chills and fever.  HENT:   Negative for hearing loss and voice change.   Eyes:  Negative for eye problems.  Respiratory:  Positive for shortness of breath. Negative for chest tightness and cough.   Cardiovascular:  Negative for chest pain.  Gastrointestinal:  Negative for abdominal distention, abdominal pain, blood in stool, diarrhea and nausea.  Endocrine: Negative for hot flashes.  Genitourinary:  Negative for difficulty urinating and frequency.   Musculoskeletal:  Negative for arthralgias and back pain.       Lower extremity weakness  Skin:  Negative for itching and rash.  Neurological:  Negative for extremity weakness, headaches and light-headedness.  Hematological:  Negative for adenopathy.  Psychiatric/Behavioral:  Negative for confusion and depression. The  patient is not nervous/anxious.        Forgetful     MEDICAL HISTORY:  Past Medical History:  Diagnosis Date   Acute MI, inferoposterior wall ([place]) 09/30/2014   1 stent   Claustrophobia    Coronary artery disease    GERD (gastroesophageal reflux disease)    Hypercholesteremia    MI, old    Pneumonia    Small cell lung cancer ([place])    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 ([place]) 10/27/2018    SURGICAL HISTORY: Past Surgical History:  Procedure Laterality Date   APPENDECTOMY     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   BLADDER NECK SUSPENSION     CHOLECYSTECTOMY N/A 08/14/2018   Procedure: LAPAROSCOPIC CHOLECYSTECTOMY;  Surgeon: Jules Husbands, MD;  Location: [facility] ORS;  Service: General;  Laterality: N/A;   CHOLECYSTECTOMY  11/2018   CORONARY ANGIOPLASTY  09/29/2014   CORONARY STENT PLACEMENT     ENDOBRONCHIAL ULTRASOUND N/A 10/21/2018   Procedure: ENDOBRONCHIAL ULTRASOUND;  Surgeon: Laverle Hobby, MD;  Location: [facility] ORS;  Service: Pulmonary;  Laterality: N/A;   HERNIA REPAIR  2012   IR FLUORO GUIDE CV LINE RIGHT  12/19/2018   KYPHOPLASTY N/A 03/01/2020   Procedure: T7 KYPHOPLASTY;  Surgeon: Hessie Knows, MD;  Location: [facility] ORS;  Service: Orthopedics;  Laterality: N/A;   LAPAROTOMY     LEFT HEART CATHETERIZATION WITH CORONARY ANGIOGRAM N/A 09/29/2014   Procedure: LEFT HEART CATHETERIZATION WITH CORONARY ANGIOGRAM;  Surgeon: Clent Demark, MD;  Location: [place] CATH LAB;  Service: Cardiovascular;  Laterality: N/A;   PORTA CATH INSERTION N/A 01/02/2019  Procedure: PORTA CATH INSERTION;  Surgeon: Algernon Huxley, MD;  Location: [place] CV LAB;  Service: Cardiovascular;  Laterality: N/A;   PORTACATH PLACEMENT Right 10/28/2018   Procedure: INSERTION PORT-A-CATH;  Surgeon: Jules Husbands, MD;  Location: [facility] ORS;  Service: General;  Laterality: Right;   TEE WITHOUT CARDIOVERSION Right 03/30/2022   Procedure: TRANSESOPHAGEAL ECHOCARDIOGRAM (TEE);  Surgeon: Dionisio David, MD;  Location: [facility]  ORS;  Service: Cardiovascular;  Laterality: Right;   XI ROBOTIC ASSISTED VENTRAL HERNIA N/A 02/27/2021   Procedure: XI ROBOTIC ASSISTED VENTRAL HERNIA (incisional) WITH MESH;  Surgeon: Olean Ree, MD;  Location: [facility] ORS;  Service: General;  Laterality: N/A;    SOCIAL HISTORY: Social History   Socioeconomic History   Marital status: Married    Spouse name: [name]    Number of children: 3   Years of education: Not on file   Highest education level: Not on file  Occupational History   Occupation: Retired    Comment: Chief Technology Officer   Tobacco Use   Smoking status: Former    Packs/day: 1.00    Years: 39.00    Total pack years: 39.00    Types: Cigarettes    Start date: 12/21/1978    Quit date: 10/16/2018    Years since quitting: 3.7   Smokeless tobacco: Never  Vaping Use   Vaping Use: Never used  Substance and Sexual Activity   Alcohol use: Yes    Alcohol/week: 0.0 standard drinks of alcohol    Comment: occassional - approx 1 every 2 weeks    Drug use: No   Sexual activity: Yes    Birth control/protection: None  Other Topics Concern   Not on file  Social History Narrative   Not on file   Social Determinants of Health   Financial Resource Strain: Low Risk  (01/02/2019)   Overall Financial Resource Strain (CARDIA)    Difficulty of Paying Living Expenses: Not very hard  Food Insecurity: Not on file  Transportation Needs: No Transportation Needs (01/02/2019)   PRAPARE - Hydrologist (Medical): No    Lack of Transportation (Non-Medical): No  Physical Activity: Unknown (01/02/2019)   Exercise Vital Sign    Days of Exercise per Week: 0 days    Minutes of Exercise per Session: Not on file  Stress: Stress Concern Present (01/02/2019)   [place]    Feeling of Stress : To some extent  Social Connections: Unknown (01/02/2019)   Social Connection and Isolation Panel [NHANES]    Frequency  of Communication with Friends and Family: Three times a week    Frequency of Social Gatherings with Friends and Family: Once a week    Attends Religious Services: 1 to 4 times per year    Active Member of [employer] or Organizations: Not on file    Attends Archivist Meetings: Not on file    Marital Status: Married  Intimate Partner Violence: Not At Risk (01/02/2019)   Humiliation, Afraid, Rape, and Kick questionnaire    Fear of Current or Ex-Partner: No    Emotionally Abused: No    Physically Abused: No    Sexually Abused: No    FAMILY HISTORY: Family History  Problem Relation Age of Onset   Alzheimer's disease Mother    Colon cancer Father    Breast cancer Neg Hx     ALLERGIES:  has No Known Allergies.  MEDICATIONS:  Current Outpatient Medications  Medication Sig Dispense Refill   acetaminophen (TYLENOL) 500 MG tablet Take 2 tablets (1,000 mg total) by mouth every 6 (six) hours as needed for mild pain or headache.     alum & mag hydroxide-simeth (MAALOX/MYLANTA) 200-200-20 MG/5ML suspension Take 30 mLs by mouth every 6 (six) hours as needed for indigestion or heartburn. 355 mL 0   apixaban (ELIQUIS) 5 MG TABS tablet Take 1 tablet (5 mg total) by mouth 2 (two) times daily. 60 tablet 1   aspirin 325 MG tablet Take 325 mg by mouth daily.     atorvastatin (LIPITOR) 40 MG tablet Take 1 tablet (40 mg total) by mouth daily. 30 tablet 3   Calcium 600-200 MG-UNIT tablet Take 1 tablet by mouth 2 (two) times daily.     Cholecalciferol (DIALYVITE VITAMIN D 5000) 125 MCG (5000 UT) capsule Take 5,000 Units by mouth daily.     diphenhydrAMINE (BENADRYL) 25 mg capsule Take 1 capsule (25 mg total) by mouth at bedtime as needed (if no sleep even with trazodone). 30 capsule 0   lidocaine-prilocaine (EMLA) cream Apply 1 application topically daily as needed (port access). 30 g 2   Magnesium 250 MG TABS Take 250 mg by mouth daily.     midodrine (PROAMATINE) 5 MG tablet Take 1 tablet (5 mg  total) by mouth 3 (three) times daily with meals. 90 tablet 1   oxyCODONE (OXY IR/ROXICODONE) 5 MG immediate release tablet Take 1 tablet (5 mg total) by mouth every 12 (twelve) hours as needed for severe pain or moderate pain. 30 tablet 0   pantoprazole (PROTONIX) 40 MG tablet Take 1 tablet (40 mg total) by mouth 2 (two) times daily. (Patient taking differently: Take 40 mg by mouth 2 (two) times daily as needed.) 60 tablet 0   prochlorperazine (COMPAZINE) 10 MG tablet Take 10 mg by mouth every 6 (six) hours as needed for nausea or vomiting.     sertraline (ZOLOFT) 100 MG tablet Take 1 tablet (100 mg total) by mouth daily. 90 tablet 3   sucralfate (CARAFATE) 1 g tablet Take 1 g by mouth 3 (three) times daily as needed (heartburn).     traZODone (DESYREL) 50 MG tablet Take 1 tablet (50 mg total) by mouth at bedtime as needed for sleep. 30 tablet 2   vitamin B-12 (CYANOCOBALAMIN) 1000 MCG tablet Take 1,000 mcg by mouth daily.     lidocaine (LIDODERM) 5 % Place 1 patch onto the skin daily. Remove & Discard patch within 12 hours or as directed by MD (Patient not taking: Reported on 04/27/2022) 30 patch 0   polyethylene glycol (MIRALAX / GLYCOLAX) 17 g packet Take 17 g by mouth daily as needed for mild constipation. (Patient not taking: Reported on 06/01/2022) 14 each 0   No current facility-administered medications for this visit.   Facility-Administered Medications Ordered in Other Visits  Medication Dose Route Frequency Provider Last Rate Last Admin   heparin lock flush 100 unit/mL  500 Units Intravenous Once Earlie Server, MD       heparin lock flush 100 unit/mL  500 Units Intravenous Once Earlie Server, MD       sodium chloride flush (NS) 0.9 % injection 10 mL  10 mL Intravenous PRN Earlie Server, MD   10 mL at 01/09/19 0820   sodium chloride flush (NS) 0.9 % injection 10 mL  10 mL Intravenous PRN Earlie Server, MD   10 mL at 01/10/19 1400     PHYSICAL EXAMINATION: ECOG PERFORMANCE STATUS:  1 - Symptomatic but  completely ambulatory Vitals:   07/13/22 0924  BP: 105/71  Pulse: 82  Temp: 98 F (36.7 C)   Filed Weights   07/13/22 0924  Weight: 130 lb 6.4 oz (59.1 kg)   Physical Examination Today's Vitals   07/13/22 0924  BP: 105/71  Pulse: 82  Temp: 98 F (36.7 C)  TempSrc: Oral  Weight: 130 lb 6.4 oz (59.1 kg)  PainSc: 0-No pain   Body mass index is 20.42 kg/m.   Physical Exam Constitutional:      General: She is not in acute distress.    Appearance: She is not diaphoretic.  HENT:     Head: Normocephalic and atraumatic.     Nose: Nose normal.     Mouth/Throat:     Pharynx: No oropharyngeal exudate.  Eyes:     General: No scleral icterus.    Pupils: Pupils are equal, round, and reactive to light.  Cardiovascular:     Rate and Rhythm: Normal rate and regular rhythm.     Heart sounds: No murmur heard. Pulmonary:     Effort: Pulmonary effort is normal. No respiratory distress.     Breath sounds: No wheezing or rales.     Comments: Decreased breath sounds bilaterally.  Abdominal:     General: There is no distension.     Palpations: Abdomen is soft.     Tenderness: There is no abdominal tenderness.  Musculoskeletal:        General: Normal range of motion.     Cervical back: Normal range of motion and neck supple.  Skin:    General: Skin is warm and dry.     Findings: No erythema.  Neurological:     Mental Status: She is alert and oriented to person, place, and time.     Cranial Nerves: No cranial nerve deficit.     Motor: No abnormal muscle tone.     Coordination: Coordination normal.  Psychiatric:        Mood and Affect: Affect normal.        LABORATORY DATA:  I have reviewed the data as listed Lab Results  Component Value Date   WBC 6.0 07/13/2022   HGB 11.8 (L) 07/13/2022   HCT 35.8 (L) 07/13/2022   MCV 85.6 07/13/2022   PLT 185 07/13/2022   Recent Labs    06/01/22 1312 06/21/22 1924 07/13/22 0912  NA 136 138 136  K 3.6 3.9 3.9  CL 105 104 105   CO2 22 22 21*  GLUCOSE 75 108* 79  BUN 17 20 16   CREATININE 0.90 1.10* 1.03*  CALCIUM 9.0 9.4 9.3  GFRNONAA >60 52* 57*  PROT 7.6 7.9 7.4  ALBUMIN 3.9 3.9 3.7  AST 25 15 25   ALT 14 12 8   ALKPHOS 59 74 58  BILITOT 0.5 1.0 0.5     RADIOGRAPHIC STUDIES: I have personally reviewed the radiological images as listed and agreed with the findings in the report.  CT CHEST ABDOMEN PELVIS W CONTRAST  Result Date: 06/15/2022 CLINICAL DATA:  Metastatic lung cancer restaging, brain metastases * Tracking Code: BO * EXAM: CT CHEST, ABDOMEN, AND PELVIS WITH CONTRAST TECHNIQUE: Multidetector CT imaging of the chest, abdomen and pelvis was performed following the standard protocol during bolus administration of intravenous contrast. RADIATION DOSE REDUCTION: This exam was performed according to the departmental dose-optimization program which includes automated exposure control, adjustment of the mA and/or kV according to patient size and/or use of iterative reconstruction technique.  CONTRAST:  168mL OMNIPAQUE IOHEXOL 300 MG/ML SOLN additional oral enteric contrast COMPARISON:  PET-CT, 11/21/2021, CT abdomen pelvis, 03/24/2022, CT chest angiogram, 03/13/2022 FINDINGS: CT CHEST FINDINGS Cardiovascular: Right chest port catheter. Aortic atherosclerosis. Normal heart size. Three-vessel coronary artery calcifications. No pericardial effusion. Mediastinum/Nodes: No enlarged mediastinal, hilar, or axillary lymph nodes. Thyroid gland, trachea, and esophagus demonstrate no significant findings. Lungs/Pleura: Mild centrilobular and paraseptal emphysema. Unchanged, bandlike scarring of the bilateral lower lobes (series 5, image 100). No pleural effusion or pneumothorax. Musculoskeletal: No chest wall mass or suspicious osseous lesions identified. Unchanged inferior endplate T4 and high-grade T7 wedge deformities status post T7 vertebral cement augmentation. Chronically displaced, sclerotic fracture of the superior sternal  body (series 7, image 82). CT ABDOMEN PELVIS FINDINGS Hepatobiliary: No focal liver abnormality is seen. Status post cholecystectomy. No biliary dilatation. Pancreas: Unremarkable. No pancreatic ductal dilatation or surrounding inflammatory changes. Spleen: Normal in size without significant abnormality. Adrenals/Urinary Tract: Adrenal glands are unremarkable. Kidneys are normal, without renal calculi, solid lesion, or hydronephrosis. Bladder is unremarkable. Stomach/Bowel: Stomach is within normal limits. Appendix is not clearly visualized. No evidence of bowel wall thickening, distention, or inflammatory changes. Vascular/Lymphatic: Aortic atherosclerosis. No enlarged abdominal or pelvic lymph nodes. Reproductive: No mass or other abnormality. Other: Ventral hernia mesh repair.  No ascites. Musculoskeletal: No acute osseous findings. IMPRESSION: 1. No CT evidence of recurrent or metastatic disease in the chest, abdomen, or pelvis. 2. Unchanged, bandlike scarring of the bilateral lower lobes. 3. Emphysema. 4. Unchanged inferior endplate T4 and high-grade T7 wedge deformities status post T7 vertebral cement augmentation. Chronically displaced, sclerotic fracture of the superior sternal body. 5. Coronary artery disease. Aortic Atherosclerosis (ICD10-I70.0). Electronically Signed   By: Delanna Ahmadi M.D.   On: 06/15/2022 16:31      ASSESSMENT & PLAN:  1. Small cell lung cancer ([place])   2. History of pulmonary embolism   3. 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 Diarrhea, unspecified type   5. Cerebrovascular accident (CVA), unspecified mechanism ([place])    #Extensive small cell lung cancer, S/p 4 cycles of Carboplatin, Etoposide and Tecentriq.   Previously Tecentriq maintenance. Hold off Tecentriq today. CT chest abdomen pelvis in June showed no recurrence. Check MRI brain  #Intermittent diarrhea, improved.  # CVA, follow up with neurology.  Continue aspirin 81 mg daily. #Acute pulmonary embolism,  likely provoked due to bacteremia/immobility secondary to weakness.  Continue Eliquis 5 mg twice daily.  . # History bone metastasis and Osteopenia previously on zometa every 4-6 weeks. .  Continue calcium supplementation.[name] Kitchen    #History of CVA, follow-up with neurology.  Follow up  MRI brain next available CT chest abdomen pelvis in mid September Prior to lab MD  Earlie Server, MD, PhD

## 2022-07-23 ENCOUNTER — Ambulatory Visit
Admission: RE | Admit: 2022-07-23 | Discharge: 2022-07-23 | Disposition: A | Discharge status: Home / Self Care | Payer: Medicare Other | Source: Ambulatory Visit | Attending: Oncology | Admitting: Oncology

## 2022-07-23 DIAGNOSIS — C3490 Malignant neoplasm of unspecified part of unspecified bronchus or lung: Secondary | ICD-10-CM | POA: Insufficient documentation

## 2022-07-23 DIAGNOSIS — R413 Other amnesia: Secondary | ICD-10-CM | POA: Diagnosis not present

## 2022-07-23 DIAGNOSIS — R2681 Unsteadiness on feet: Secondary | ICD-10-CM | POA: Diagnosis not present

## 2022-07-23 MED ORDER — GADOBUTROL 1 MMOL/ML IV SOLN
5.0000 mL | Freq: Once | INTRAVENOUS | Status: AC | PRN
  Administered 2022-07-23: 5 mL via INTRAVENOUS

## 2022-08-30 ENCOUNTER — Other Ambulatory Visit: Payer: Self-pay | Admitting: Oncology

## 2022-08-30 NOTE — Progress Notes (Signed)
ON PATHWAY REGIMEN - Small Cell Lung  No Change  Continue With Treatment as Ordered.  Original Decision Date/Time: 10/27/2018 10:36     Cycles 1 through 4, every 21 days:     Atezolizumab      Carboplatin      Etoposide    Cycles 5 and beyond, every 21 days:     Atezolizumab   **Always confirm dose/schedule in your pharmacy ordering system**  Patient Characteristics: Newly Diagnosed, Preoperative or Nonsurgical Candidate (Clinical Staging), First Line, Extensive Stage Therapeutic Status: Newly Diagnosed, Preoperative or Nonsurgical Candidate (Clinical Staging) AJCC T Category: cT4 AJCC N Category: cN3 AJCC M Category: cM1 AJCC 8 Stage Grouping: IV Stage Classification: Extensive Intent of Therapy: Non-Curative / Palliative Intent, Discussed with Patient

## 2022-09-02 ENCOUNTER — Ambulatory Visit
Admission: RE | Admit: 2022-09-02 | Discharge: 2022-09-02 | Disposition: A | Discharge status: Home / Self Care | Payer: Medicare Other | Source: Ambulatory Visit | Attending: Oncology | Admitting: Oncology

## 2022-09-02 DIAGNOSIS — J8410 Pulmonary fibrosis, unspecified: Secondary | ICD-10-CM | POA: Diagnosis not present

## 2022-09-02 DIAGNOSIS — K5730 Diverticulosis of large intestine without perforation or abscess without bleeding: Secondary | ICD-10-CM | POA: Diagnosis not present

## 2022-09-02 DIAGNOSIS — C3490 Malignant neoplasm of unspecified part of unspecified bronchus or lung: Secondary | ICD-10-CM | POA: Insufficient documentation

## 2022-09-02 LAB — POCT I-STAT CREATININE: Creatinine, Ser: 0.9 mg/dL (ref 0.44–1.00)

## 2022-09-02 MED ORDER — IOHEXOL 300 MG/ML  SOLN
100.0000 mL | Freq: Once | INTRAMUSCULAR | Status: AC | PRN
  Administered 2022-09-02: 100 mL via INTRAVENOUS

## 2022-09-02 MED ORDER — HEPARIN SOD (PORK) LOCK FLUSH 100 UNIT/ML IV SOLN
INTRAVENOUS | Status: AC
  Filled 2022-09-02: qty 5

## 2022-09-02 MED ORDER — HEPARIN SOD (PORK) LOCK FLUSH 100 UNIT/ML IV SOLN
500.0000 [IU] | Freq: Once | INTRAVENOUS | Status: AC
  Administered 2022-09-02: 500 [IU] via INTRAVENOUS

## 2022-09-03 ENCOUNTER — Other Ambulatory Visit: Payer: Self-pay | Admitting: Oncology

## 2022-09-03 ENCOUNTER — Telehealth: Payer: Self-pay

## 2022-09-03 DIAGNOSIS — Z86711 Personal history of pulmonary embolism: Secondary | ICD-10-CM

## 2022-09-03 MED ORDER — APIXABAN (ELIQUIS) VTE STARTER PACK (10MG AND 5MG): ORAL_TABLET | ORAL | 0 refills | Status: DC

## 2022-09-03 NOTE — Telephone Encounter (Signed)
-----   Message from Earlie Server, MD sent at 09/03/2022 10:56 AM EDT ----- Please let patient know that CT showed no recurrence of cancer, but she has blood clots in her lungs. Recommend her to be started on Eliquis 10mg  BID x 7 days followed by Eliquis 5mg  BID.  Please arrange her to get bilateral LE [place] - use PE as diagnosis.  Keep her follow up appt.

## 2022-09-03 NOTE — Telephone Encounter (Signed)
Spoke to pt's husband and informed him of plan and MD recommendations. He states that Kristi [name] was on Eliquis but she ran out of medication and stopped taking it. She has not taken Eliquis for over a month.   Informed Kristi [name] (husband) that Eliquis starter pack was sent to [facility] in [place].

## 2022-09-04 ENCOUNTER — Emergency Department
Admission: EM | Admit: 2022-09-04 | Discharge: 2022-09-05 | Disposition: A | Discharge status: Home / Self Care | Payer: Medicare Other | Attending: Emergency Medicine | Admitting: Emergency Medicine

## 2022-09-04 ENCOUNTER — Other Ambulatory Visit: Payer: Self-pay

## 2022-09-04 ENCOUNTER — Encounter: Payer: Self-pay | Admitting: Oncology

## 2022-09-04 ENCOUNTER — Emergency Department: Payer: Medicare Other

## 2022-09-04 DIAGNOSIS — C3490 Malignant neoplasm of unspecified part of unspecified bronchus or lung: Secondary | ICD-10-CM

## 2022-09-04 DIAGNOSIS — R531 Weakness: Secondary | ICD-10-CM | POA: Diagnosis not present

## 2022-09-04 DIAGNOSIS — I959 Hypotension, unspecified: Secondary | ICD-10-CM | POA: Diagnosis not present

## 2022-09-04 DIAGNOSIS — W19XXXA Unspecified fall, initial encounter: Secondary | ICD-10-CM | POA: Insufficient documentation

## 2022-09-04 LAB — BASIC METABOLIC PANEL
Anion gap: 9 (ref 5–15)
BUN: 17 mg/dL (ref 8–23)
CO2: 25 mmol/L (ref 22–32)
Calcium: 8.8 mg/dL — ABNORMAL LOW (ref 8.9–10.3)
Chloride: 104 mmol/L (ref 98–111)
Creatinine, Ser: 1 mg/dL (ref 0.44–1.00)
GFR, Estimated: 59 mL/min — ABNORMAL LOW (ref >60)
Glucose, Bld: 118 mg/dL — ABNORMAL HIGH (ref 70–99)
Potassium: 3.7 mmol/L (ref 3.5–5.1)
Sodium: 138 mmol/L (ref 135–145)

## 2022-09-04 LAB — CBC WITH DIFFERENTIAL/PLATELET
Abs Immature Granulocytes: 0.04 10*3/uL (ref 0.00–0.07)
Basophils Absolute: 0 10*3/uL (ref 0.0–0.1)
Basophils Relative: 0 %
Eosinophils Absolute: 0.2 10*3/uL (ref 0.0–0.5)
Eosinophils Relative: 2 %
HCT: 35.2 % — ABNORMAL LOW (ref 36.0–46.0)
Hemoglobin: 11.2 g/dL — ABNORMAL LOW (ref 12.0–15.0)
Immature Granulocytes: 0 %
Lymphocytes Relative: 12 %
Lymphs Abs: 1.1 10*3/uL (ref 0.7–4.0)
MCH: 27.8 pg (ref 26.0–34.0)
MCHC: 31.8 g/dL (ref 30.0–36.0)
MCV: 87.3 fL (ref 80.0–100.0)
Monocytes Absolute: 1.2 10*3/uL — ABNORMAL HIGH (ref 0.1–1.0)
Monocytes Relative: 14 %
Neutro Abs: 6.4 10*3/uL (ref 1.7–7.7)
Neutrophils Relative %: 72 %
Platelets: 155 10*3/uL (ref 150–400)
RBC: 4.03 MIL/uL (ref 3.87–5.11)
RDW: 14.7 % (ref 11.5–15.5)
WBC: 8.9 10*3/uL (ref 4.0–10.5)
nRBC: 0 % (ref 0.0–0.2)

## 2022-09-04 LAB — TROPONIN I (HIGH SENSITIVITY): Troponin I (High Sensitivity): 4 ng/L (ref <18)

## 2022-09-04 NOTE — ED Notes (Signed)
Pt voices need to void, unable to get up d/t weakness.  Pure Wick placed and pt positioned for comfort.  Spouse at bedside.

## 2022-09-04 NOTE — Telephone Encounter (Signed)
Pt's granddaughter [place] requested a call back for discussion of follow up. Called her back and she said that Mrs. Frasier asked to call and clarify plan so she could explain everything to her. Informed [place] of MD recommendations and about f/u and [place] appts.

## 2022-09-04 NOTE — ED Triage Notes (Signed)
Pt reports increased weakness to the point that family has to assist pt to stand/ambulate to bathroom.  Pt reports that today she has felt dizzy, denies syncope episode.  EMS reports that upon their arrival BP was 50/30  in which 974ml of IV fluid was given.   Upon arrival to [facility] pt denies dizziness, pain or discomfort.

## 2022-09-04 NOTE — ED Provider Notes (Signed)
[facility] Provider Note    Event Date/Time   First MD Initiated Contact with Patient 09/04/22 2147     (approximate)   History   Weakness   HPI  Kristi [name] is a 76 y.o. female  who presents to the emergency department today because of concern for weakness. The patient is unable to give great history, but states that she has felt increasingly weak today. Does state she had a fall but denies any traumatic injury from the fall. Denies any chest pain or headache. Denies any fevers. Apparently when EMS arrived on scene patient was found to be significantly hypotensive. Was given fluids during transport.       Physical Exam   Triage Vital Signs: ED Triage Vitals  Enc Vitals Group     BP 09/04/22 2137 121/69     Pulse Rate 09/04/22 2137 88     Resp 09/04/22 2137 19     Temp 09/04/22 2137 98.4 F (36.9 C)     Temp Source 09/04/22 2137 Oral     SpO2 09/04/22 2137 97 %     Weight 09/04/22 2139 137 lb (62.1 kg)     Height 09/04/22 2139 5\' 7"  (1.702 m)     Head Circumference --      Peak Flow --      Pain Score 09/04/22 2138 0     Pain Loc --      Pain Edu? --      Excl. in [place]? --     Most recent vital signs: Vitals:   09/04/22 2137  BP: 121/69  Pulse: 88  Resp: 19  Temp: 98.4 F (36.9 C)  SpO2: 97%    General: Awake, alert, not completely oriented. CV:  Good peripheral perfusion. Regular rate and rhythm Resp:  Normal effort. Lungs clear. Abd:  No distention. Non tender.    ED Results / Procedures / Treatments   Labs (all labs ordered are listed, but only abnormal results are displayed) Labs Reviewed  CBC WITH DIFFERENTIAL/PLATELET - Abnormal; Notable for the following components:      Result Value   Hemoglobin 11.2 (*)    HCT 35.2 (*)    Monocytes Absolute 1.2 (*)    All other components within normal limits  BASIC METABOLIC PANEL  URINALYSIS, ROUTINE W REFLEX MICROSCOPIC  TROPONIN I (HIGH SENSITIVITY)     EKG  I,  Nance Pear, attending physician, personally viewed and interpreted this EKG  EKG Time: 2142 Rate: 83 Rhythm: sinus rhythm Axis: normal Intervals: qtc 447 QRS: narrow, low voltage ST changes: no st elevation Impression: abnormal ekg   RADIOLOGY I independently interpreted and visualized the CXR. My interpretation: No pneumonia. Radiology interpretation:  IMPRESSION:  No acute abnormality noted.      PROCEDURES:  Critical Care performed: No  Procedures   MEDICATIONS ORDERED IN ED: Medications - No data to display   IMPRESSION / MDM / [place] / ED COURSE  I reviewed the triage vital signs and the nursing notes.                              Differential diagnosis includes, but is not limited to, dehydration, infection, medication effect.  Patient's presentation is most consistent with acute presentation with potential threat to life or bodily function.  Patient presents to the emergency department today because of concerns for generalized weakness.  When EMS arrived patient was  found to be quite hypotensive.  Initial blood pressure in the emergency department without significant hypotension.  However while here in the emergency department her blood pressure did elevate and decrease at times.  Given concern for infection at chest x-ray and urine was ordered.  Chest x-ray without any obvious pneumonia.  Awaiting urine at time of signout.  Troponin was negative.  Do think if urine is negative and patient's blood pressure is elevated and she feels strong to walk potentially she could be discharged with diagnosis of dehydration otherwise would have low threshold for admission.   FINAL CLINICAL IMPRESSION(S) / ED DIAGNOSES   Weakness Hypotension    Note:  This document was prepared using Dragon voice recognition software and may include unintentional dictation errors.    Nance Pear, MD 09/05/22 [phone]

## 2022-09-05 DIAGNOSIS — R531 Weakness: Secondary | ICD-10-CM | POA: Diagnosis not present

## 2022-09-05 LAB — URINALYSIS, ROUTINE W REFLEX MICROSCOPIC
Bilirubin Urine: NEGATIVE
Glucose, UA: NEGATIVE mg/dL
Ketones, ur: NEGATIVE mg/dL
Leukocytes,Ua: NEGATIVE
Nitrite: POSITIVE — AB
Protein, ur: NEGATIVE mg/dL
Specific Gravity, Urine: 1.014 (ref 1.005–1.030)
pH: 5 (ref 5.0–8.0)

## 2022-09-05 LAB — TROPONIN I (HIGH SENSITIVITY): Troponin I (High Sensitivity): 4 ng/L (ref <18)

## 2022-09-05 MED ORDER — SODIUM CHLORIDE 0.9 % IV BOLUS
500.0000 mL | Freq: Once | INTRAVENOUS | Status: AC
  Administered 2022-09-05: 500 mL via INTRAVENOUS

## 2022-09-05 NOTE — ED Notes (Signed)
Straight cath 53F using sterile technique to obtain urine sample, sent to lab for testing.

## 2022-09-05 NOTE — ED Provider Notes (Signed)
Patient signed out to me pending reassessment of vital signs UA and repeat troponin.  76 year old female presenting with generalized weakness.  Patient's blood pressure has improved after 500 cc bolus.  Repeat troponin is normal, and her UA does not suggest infection.  Patient would like to go home and I think this is reasonable.  Will discharge.   Rada Hay, MD 09/05/22 [phone]

## 2022-09-05 NOTE — ED Notes (Signed)
Assisted pt to and from bathroom.  Pt very unsteady, requiring intense assistance x1.  Pt unable to void, incontinent at most times as per pt.

## 2022-09-05 NOTE — ED Notes (Signed)
DC instructions given verbally and in writing... understanding voiced, signatrure obtained.  Pt taken via wheelchair to POV with spouse.

## 2022-09-05 NOTE — Discharge Instructions (Signed)
Your blood pressure improved with fluids.  Make sure you are drinking enough liquid at home.  Your blood work otherwise was reassuring.  Please follow-up with your primary care provider.

## 2022-09-07 ENCOUNTER — Inpatient Hospital Stay: Payer: Medicare Other | Attending: Oncology

## 2022-09-07 ENCOUNTER — Inpatient Hospital Stay (HOSPITAL_BASED_OUTPATIENT_CLINIC_OR_DEPARTMENT_OTHER): Payer: Medicare Other | Admitting: Oncology

## 2022-09-07 ENCOUNTER — Other Ambulatory Visit: Payer: Self-pay | Admitting: Oncology

## 2022-09-07 ENCOUNTER — Encounter: Payer: Self-pay | Admitting: Oncology

## 2022-09-07 VITALS — BP 91/73 | HR 74 | Temp 96.7°F | Ht 67.0 in | Wt 133.8 lb

## 2022-09-07 DIAGNOSIS — Z7901 Long term (current) use of anticoagulants: Secondary | ICD-10-CM | POA: Diagnosis not present

## 2022-09-07 DIAGNOSIS — I2699 Other pulmonary embolism without acute cor pulmonale: Secondary | ICD-10-CM | POA: Diagnosis not present

## 2022-09-07 DIAGNOSIS — M8580 Other specified disorders of bone density and structure, unspecified site: Secondary | ICD-10-CM | POA: Insufficient documentation

## 2022-09-07 DIAGNOSIS — Z79899 Other long term (current) drug therapy: Secondary | ICD-10-CM | POA: Diagnosis not present

## 2022-09-07 DIAGNOSIS — Z993 Dependence on wheelchair: Secondary | ICD-10-CM | POA: Diagnosis not present

## 2022-09-07 DIAGNOSIS — R531 Weakness: Secondary | ICD-10-CM

## 2022-09-07 DIAGNOSIS — Z87891 Personal history of nicotine dependence: Secondary | ICD-10-CM | POA: Insufficient documentation

## 2022-09-07 DIAGNOSIS — Z8589 Personal history of malignant neoplasm of other organs and systems: Secondary | ICD-10-CM | POA: Insufficient documentation

## 2022-09-07 DIAGNOSIS — C3490 Malignant neoplasm of unspecified part of unspecified bronchus or lung: Secondary | ICD-10-CM

## 2022-09-07 LAB — CBC WITH DIFFERENTIAL/PLATELET
Abs Immature Granulocytes: 0.07 10*3/uL (ref 0.00–0.07)
Basophils Absolute: 0.1 10*3/uL (ref 0.0–0.1)
Basophils Relative: 1 %
Eosinophils Absolute: 0.3 10*3/uL (ref 0.0–0.5)
Eosinophils Relative: 5 %
HCT: 36.2 % (ref 36.0–46.0)
Hemoglobin: 11.9 g/dL — ABNORMAL LOW (ref 12.0–15.0)
Immature Granulocytes: 1 %
Lymphocytes Relative: 21 %
Lymphs Abs: 1.2 10*3/uL (ref 0.7–4.0)
MCH: 28.3 pg (ref 26.0–34.0)
MCHC: 32.9 g/dL (ref 30.0–36.0)
MCV: 86 fL (ref 80.0–100.0)
Monocytes Absolute: 0.8 10*3/uL (ref 0.1–1.0)
Monocytes Relative: 14 %
Neutro Abs: 3.4 10*3/uL (ref 1.7–7.7)
Neutrophils Relative %: 58 %
Platelets: 199 10*3/uL (ref 150–400)
RBC: 4.21 MIL/uL (ref 3.87–5.11)
RDW: 14.9 % (ref 11.5–15.5)
WBC: 5.9 10*3/uL (ref 4.0–10.5)
nRBC: 0 % (ref 0.0–0.2)

## 2022-09-07 LAB — COMPREHENSIVE METABOLIC PANEL
ALT: 9 U/L (ref 0–44)
AST: 14 U/L — ABNORMAL LOW (ref 15–41)
Albumin: 4 g/dL (ref 3.5–5.0)
Alkaline Phosphatase: 62 U/L (ref 38–126)
Anion gap: 8 (ref 5–15)
BUN: 12 mg/dL (ref 8–23)
CO2: 25 mmol/L (ref 22–32)
Calcium: 9 mg/dL (ref 8.9–10.3)
Chloride: 102 mmol/L (ref 98–111)
Creatinine, Ser: 1.01 mg/dL — ABNORMAL HIGH (ref 0.44–1.00)
GFR, Estimated: 58 mL/min — ABNORMAL LOW (ref >60)
Glucose, Bld: 90 mg/dL (ref 70–99)
Potassium: 3.9 mmol/L (ref 3.5–5.1)
Sodium: 135 mmol/L (ref 135–145)
Total Bilirubin: 0.5 mg/dL (ref 0.3–1.2)
Total Protein: 7.9 g/dL (ref 6.5–8.1)

## 2022-09-07 LAB — CORTISOL: Cortisol, Plasma: 16.1 ug/dL

## 2022-09-07 LAB — TSH: TSH: 3.301 u[IU]/mL (ref 0.350–4.500)

## 2022-09-07 NOTE — Assessment & Plan Note (Signed)
#  Extensive small cell lung cancer, S/p 4 cycles of Carboplatin, Etoposide and Tecentriq.   Previously Tecentriq maintenance. 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  Diarrhea has completely resolved. CT chest abdomen pelvis imaging and MRI brain recently showed no evidence of cancer recurrence. Discussed with patient and husband.  Shared decision was made to restart Tecentriq in the next few weeks.

## 2022-09-07 NOTE — Progress Notes (Signed)
Hematology/Oncology Progress note [phone] [phone]      Patient Care Team: Glean Hess, MD as PCP - General (Internal Medicine) Charolette Forward, MD as Consulting Physician (Cardiology) Telford Nab, RN as Registered Nurse [name], MD as Radiation Oncologist (Radiation Oncology)   ASSESSMENT & PLAN:    Small cell lung cancer ([place]) #Extensive small cell lung cancer, S/p 4 cycles of Carboplatin, Etoposide and Tecentriq.   Previously Tecentriq maintenance. 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  Diarrhea has completely resolved. CT chest abdomen pelvis imaging and MRI brain recently showed no evidence of cancer recurrence. Discussed with patient and husband.  Shared decision was made to restart Tecentriq in the next few weeks.  Pulmonary embolus (HCC) #Acute pulmonary embolism, unprovoked, recurrent. Continue Eliquis for anticoagulation. 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 .  Continue calcium supplementation.  Plan to resume Zometa.  Orders Placed This Encounter  Procedures   CBC with Differential    Standing Status:   Future    Standing Expiration Date:   09/29/2023   Comprehensive metabolic panel    Standing Status:   Future    Standing Expiration Date:   09/29/2023   Follow-up in 2 to 3 weeks to start Tecentriq treatments. All questions were answered. The patient knows to call the clinic with any problems, questions or concerns.  Earlie Server, MD, PhD [facility] Hematology Oncology 09/07/2022      REASON FOR VISIT:  Follow-up for small cell lung cancer,   HISTORY OF PRESENTING ILLNESS:  Kristi [name] is a  76 y.o.  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  10/20/2018 CT chest with contrast showed large mediastinal mass involving both hilar, left greater  than right, consistent with lung carcinoma, The mass causes narrowing of the left mainstem bronchus with resultant volume loss on the left and a mediastinal shift to the left.  Moderate size left pleural effusion. 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  # Initial MRI brain negative.  # Nov 2019- Jan 2020 s/p 4 cycles of Carbo/Etoposide/Tecentriq #  01/24/2019 interim CT scan done which showed continued positive response to therapy with continued reduction in mediastinal adenopathy.  No residual measurable left lung mass. CT findings of acute emphysematous cystitis. Urology did not feel that patient has pyelonephritis and recommend treatment with antibiotics because patient's immunocompromise. Patient finished treatment.   # 11/02/2018-01/09/2019 Chemotherapy carboplatin + Etoposide + tecentriq # Consolidation chest radiation and whole brain radiation finished in May 2020 # 04/25/2019 resume Tecentriq every 3 weeks. # Patient had a fall from her stairs on 01/19/2020. In the emergency room she had CT chest abdomen pelvis CT, CT head without contrast, CT cervical spine without contrast. No CT evidence for acute intracranial abnormality, degenerative changes of cervical spine..  No CT evidence of acute thoracic abdomen injury.  Severe compression fracture of T7, subacute.  # kyphoplasty procedure on 03/01/2020.  T7 biopsy negative for cancer.  11/20/2020 Surveillance CT scan  Reduced size and prominence of right lateral lung base subpleural nodular density. No findings of active malignancy.  MRI brain showed stable post treatment brain. No metastatic disease.   # 10/29/2021 MRI brain w and wo contrast showed two definite new enhancing lesions in cerebellum, with associated restricted diffusion and increased T2  signal, concerning for new foci of metastatic disease. 2. A third lesion in the right cerebellar hemisphere was  likely present on the prior exam but continues to show limited contrast enhancement on SPGR; this lesion is associated with diffusion restriction and increased T2 signal, but, on coronal and sagittal T1 sequences, it correlates with an area affected by pulsation artifact. This is also felt to represent a new focus of metastatic disease.  11/20/2021 PET scan - no FDG avid disease in neck chest abdomen or pelvis.  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 No enhancing metastatic disease seen today, chronic small vessel ischemia.   12/19/2021 was seen by neurology Dr.Vaslow. increase ASA to 325mg  daily. Atorvastatin.  40 mg  03/13/2022 - 03/18/2022, patient was hospitalized due to lower extremity weakness CT head did not show any abnormality.  MRI brain confirmed no evidence of new CVA.  Patient was seen by neurologist and weakness was felt to be multifactorial. CT chest angiogram protocol showed bilateral pulmonary emboli, patient was started on heparin drip and transition to Xarelto at discharge. For 03/25/2022-4 /10/2022, patient was readmitted due to positive blood culture which was done during her previous admission.  Blood culture was positive for Streptococcus species in both bottles.  Patient had soft blood pressures initially and was given supportive care and IV antibiotics.  She completed antibiotic course.  At discharge, she was recommended to continue anticoagulation.  Both patient and husband are not aware about diagnosis of pulmonary embolism and not aware about new prescription of Xarelto. Xarelto appeared to be prescribed and prescription was printed out.  Patient and husband both not aware about the new prescription.  Patient has been off anticoagulation presumably since 03/31/2022.  Occasionally she has shortness of breath.  No chest pain.  She reports feeling 100% better  today.   INTERVAL HISTORY Kristi [name] is a 76 y.o. female who has above history reviewed by me today presents for evaluation prior to immunotherapy for treatment of extensive small cell lung cancer Patient continues to have occasional episodes of feeling confusion Arneta Cliche.  Recent ER visit. Patient was on Eliquis 5 mg twice daily.  She ran out of the medicine and thought she pneumogram needs anticoagulation and had been off for about a months.  CT scan showed no evidence of cancer recurrence but acute pulmonary embolism.  Patient was advised to restart Eliquis.  10 mg twice daily for 1 week followed by 5 mg twice daily. Patient denies shortness of breath, leg swelling. No diarrhea. Mobility has improved. .  Review of Systems  Constitutional:  Positive for fatigue. Negative for appetite change, chills and fever.  HENT:   Negative for hearing loss and voice change.   Eyes:  Negative for eye problems.  Respiratory:  Positive for shortness of breath. Negative for chest tightness and cough.   Cardiovascular:  Negative for chest pain.  Gastrointestinal:  Negative for abdominal distention, abdominal pain, blood in stool, diarrhea and nausea.  Endocrine: Negative for hot flashes.  Genitourinary:  Negative for difficulty urinating and frequency.   Musculoskeletal:  Negative for arthralgias and back pain.       Lower extremity weakness  Skin:  Negative for itching and rash.  Neurological:  Negative for extremity weakness, headaches and light-headedness.  Hematological:  Negative for adenopathy.  Psychiatric/Behavioral:  Negative for confusion and depression. The patient is not nervous/anxious.  Forgetful     MEDICAL HISTORY:  Past Medical History:  Diagnosis Date   Acute MI, inferoposterior wall ([place]) 09/30/2014   1 stent   Claustrophobia    Coronary artery disease    GERD (gastroesophageal reflux disease)    Hypercholesteremia    MI, old    Pneumonia    Small cell lung  cancer ([place])    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 ([place]) 10/27/2018    SURGICAL HISTORY: Past Surgical History:  Procedure Laterality Date   APPENDECTOMY     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   BLADDER NECK SUSPENSION     CHOLECYSTECTOMY N/A 08/14/2018   Procedure: LAPAROSCOPIC CHOLECYSTECTOMY;  Surgeon: Jules Husbands, MD;  Location: [facility] ORS;  Service: General;  Laterality: N/A;   CHOLECYSTECTOMY  11/2018   CORONARY ANGIOPLASTY  09/29/2014   CORONARY STENT PLACEMENT     ENDOBRONCHIAL ULTRASOUND N/A 10/21/2018   Procedure: ENDOBRONCHIAL ULTRASOUND;  Surgeon: Laverle Hobby, MD;  Location: [facility] ORS;  Service: Pulmonary;  Laterality: N/A;   HERNIA REPAIR  2012   IR FLUORO GUIDE CV LINE RIGHT  12/19/2018   KYPHOPLASTY N/A 03/01/2020   Procedure: T7 KYPHOPLASTY;  Surgeon: Hessie Knows, MD;  Location: [facility] ORS;  Service: Orthopedics;  Laterality: N/A;   LAPAROTOMY     LEFT HEART CATHETERIZATION WITH CORONARY ANGIOGRAM N/A 09/29/2014   Procedure: LEFT HEART CATHETERIZATION WITH CORONARY ANGIOGRAM;  Surgeon: Clent Demark, MD;  Location: [place] [facility];  Service: Cardiovascular;  Laterality: N/A;   PORTA CATH INSERTION N/A 01/02/2019   Procedure: PORTA CATH INSERTION;  Surgeon: Algernon Huxley, MD;  Location: [place] CV [facility];  Service: Cardiovascular;  Laterality: N/A;   PORTACATH PLACEMENT Right 10/28/2018   Procedure: INSERTION PORT-A-CATH;  Surgeon: Jules Husbands, MD;  Location: [facility] OR[facility];  Service: General;  Laterality: Right;   TEE WITHOUT CARDIOVERSION Right 03/30/2022   Procedure: TRANSESOPHAGEAL ECHOCARDIOGRAM (TEE);  Surgeon: Dionisio David, MD;  Location: [facility] OR[facility];  Service: Cardiovascular;  Laterality: Right;   XI ROBOTIC ASSISTED VENTRAL HERNIA N/A 02/27/2021   Procedure: XI ROBOTIC ASSISTED VENTRAL HERNIA (incisional) WITH MESH;  Surgeon: Olean Ree, MD;  Location: [facility] ORS;  Service: General;  Laterality: N/A;    SOCIAL HISTORY: Social History   Socioeconomic History    Marital status: Married    Spouse name: [name]    Number of children: 3   Years of education: Not on file   Highest education level: Not on file  Occupational History   Occupation: Retired    Comment: Chief Technology Officer   Tobacco Use   Smoking status: Former    Packs/day: 1.00    Years: 39.00    Total pack years: 39.00    Types: Cigarettes    Start date: 12/21/1978    Quit date: 10/16/2018    Years since quitting: 3.8   Smokeless tobacco: Never  Vaping Use   Vaping Use: Never used  Substance and Sexual Activity   Alcohol use: Yes    Alcohol/week: 0.0 standard drinks of alcohol    Comment: occassional - approx 1 every 2 weeks    Drug use: No   Sexual activity: Yes    Birth control/protection: None  Other Topics Concern   Not on file  Social History Narrative   Not on file   Social Determinants of Health   Financial Resource Strain: Low Risk  (01/02/2019)   Overall Financial Resource Strain (CARDIA)    Difficulty of Paying Living Expenses: Not very  hard  Food Insecurity: Not on file  Transportation Needs: No Transportation Needs (01/02/2019)   PRAPARE - Hydrologist (Medical): No    Lack of Transportation (Non-Medical): No  Physical Activity: Unknown (01/02/2019)   Exercise Vital Sign    Days of Exercise per Week: 0 days    Minutes of Exercise per Session: Not on file  Stress: Stress Concern Present (01/02/2019)   [place]    Feeling of Stress : To some extent  Social Connections: Unknown (01/02/2019)   Social Connection and Isolation Panel [NHANES]    Frequency of Communication with Friends and Family: Three times a week    Frequency of Social Gatherings with Friends and Family: Once a week    Attends Religious Services: 1 to 4 times per year    Active Member of [employer] or Organizations: Not on file    Attends Archivist Meetings: Not on file    Marital Status: Married   Intimate Partner Violence: Not At Risk (01/02/2019)   Humiliation, Afraid, Rape, and Kick questionnaire    Fear of Current or Ex-Partner: No    Emotionally Abused: No    Physically Abused: No    Sexually Abused: No    FAMILY HISTORY: Family History  Problem Relation Age of Onset   Alzheimer's disease Mother    Colon cancer Father    Breast cancer Neg Hx     ALLERGIES:  has No Known Allergies.  MEDICATIONS:  Current Outpatient Medications  Medication Sig Dispense Refill   acetaminophen (TYLENOL) 500 MG tablet Take 2 tablets (1,000 mg total) by mouth every 6 (six) hours as needed for mild pain or headache.     alum & mag hydroxide-simeth (MAALOX/MYLANTA) 200-200-20 MG/5ML suspension Take 30 mLs by mouth every 6 (six) hours as needed for indigestion or heartburn. 355 mL 0   APIXABAN (ELIQUIS) VTE STARTER PACK (10MG  AND 5MG ) Take as directed on package: start with two-5mg  tablets twice daily for 7 days. On day 8, switch to one-5mg  tablet twice daily. 1 each 0   atorvastatin (LIPITOR) 40 MG tablet Take 1 tablet (40 mg total) by mouth daily. 30 tablet 3   Calcium 600-200 MG-UNIT tablet Take 1 tablet by mouth 2 (two) times daily.     Cholecalciferol (DIALYVITE VITAMIN D 5000) 125 MCG (5000 UT) capsule Take 5,000 Units by mouth daily.     diphenhydrAMINE (BENADRYL) 25 mg capsule Take 1 capsule (25 mg total) by mouth at bedtime as needed (if no sleep even with trazodone). 30 capsule 0   Magnesium 250 MG TABS Take 250 mg by mouth daily.     midodrine (PROAMATINE) 5 MG tablet Take 1 tablet (5 mg total) by mouth 3 (three) times daily with meals. 90 tablet 1   oxyCODONE (OXY IR/ROXICODONE) 5 MG immediate release tablet Take 1 tablet (5 mg total) by mouth every 12 (twelve) hours as needed for severe pain or moderate pain. 30 tablet 0   pantoprazole (PROTONIX) 40 MG tablet Take 1 tablet (40 mg total) by mouth 2 (two) times daily. (Patient taking differently: Take 40 mg by mouth 2 (two) times daily  as needed.) 60 tablet 0   prochlorperazine (COMPAZINE) 10 MG tablet Take 10 mg by mouth every 6 (six) hours as needed for nausea or vomiting.     sertraline (ZOLOFT) 100 MG tablet Take 1 tablet (100 mg total) by mouth daily. 90 tablet 3  sucralfate (CARAFATE) 1 g tablet Take 1 g by mouth 3 (three) times daily as needed (heartburn).     traZODone (DESYREL) 50 MG tablet Take 1 tablet (50 mg total) by mouth at bedtime as needed for sleep. 30 tablet 2   vitamin B-12 (CYANOCOBALAMIN) 1000 MCG tablet Take 1,000 mcg by mouth daily.     lidocaine (LIDODERM) 5 % Place 1 patch onto the skin daily. Remove & Discard patch within 12 hours or as directed by MD (Patient not taking: Reported on 04/27/2022) 30 patch 0   polyethylene glycol (MIRALAX / GLYCOLAX) 17 g packet Take 17 g by mouth daily as needed for mild constipation. (Patient not taking: Reported on 06/01/2022) 14 each 0   No current facility-administered medications for this visit.   Facility-Administered Medications Ordered in Other Visits  Medication Dose Route Frequency Provider Last Rate Last Admin   heparin lock flush 100 unit/mL  500 Units Intravenous Once Earlie Server, MD       heparin lock flush 100 unit/mL  500 Units Intravenous Once Earlie Server, MD       sodium chloride flush (NS) 0.9 % injection 10 mL  10 mL Intravenous PRN Earlie Server, MD   10 mL at 01/09/19 0820   sodium chloride flush (NS) 0.9 % injection 10 mL  10 mL Intravenous PRN Earlie Server, MD   10 mL at 01/10/19 1400     PHYSICAL EXAMINATION: ECOG PERFORMANCE STATUS: 1 - Symptomatic but completely ambulatory Vitals:   09/07/22 1017  BP: 91/73  Pulse: 74  Temp: (!) 96.7 F (35.9 C)  SpO2: 100%   Filed Weights   09/07/22 1017  Weight: 133 lb 12.8 oz (60.7 kg)   Physical Examination Today's Vitals   09/07/22 1017  BP: 91/73  Pulse: 74  Temp: (!) 96.7 F (35.9 C)  TempSrc: Tympanic  SpO2: 100%  Weight: 133 lb 12.8 oz (60.7 kg)  Height: 5\' 7"  (1.702 m)  PainSc: 5   PainLoc:  Abdomen   Body mass index is 20.96 kg/m.   Physical Exam Constitutional:      General: She is not in acute distress.    Appearance: She is not diaphoretic.     Comments: Patient sits in the wheelchair Frail appearance  HENT:     Head: Normocephalic and atraumatic.     Nose: Nose normal.     Mouth/Throat:     Pharynx: No oropharyngeal exudate.  Eyes:     General: No scleral icterus.    Pupils: Pupils are equal, round, and reactive to light.  Cardiovascular:     Rate and Rhythm: Normal rate and regular rhythm.     Heart sounds: No murmur heard. Pulmonary:     Effort: Pulmonary effort is normal. No respiratory distress.     Breath sounds: No wheezing or rales.     Comments: Decreased breath sounds bilaterally.  Abdominal:     General: There is no distension.     Palpations: Abdomen is soft.     Tenderness: There is no abdominal tenderness.  Musculoskeletal:        General: Normal range of motion.     Cervical back: Normal range of motion and neck supple.  Skin:    General: Skin is warm and dry.     Findings: No erythema.  Neurological:     Mental Status: She is alert and oriented to person, place, and time.     Cranial Nerves: No cranial nerve deficit.  Motor: No abnormal muscle tone.     Coordination: Coordination normal.  Psychiatric:        Mood and Affect: Affect normal.        LABORATORY DATA:  I have reviewed the data as listed    Latest Ref Rng & Units 09/07/2022    9:52 AM 09/04/2022   10:07 PM 07/13/2022    9:12 AM  CBC  WBC 4.0 - 10.5 K/uL 5.9  8.9  6.0   Hemoglobin 12.0 - 15.0 g/dL 11.9  11.2  11.8   Hematocrit 36.0 - 46.0 % 36.2  35.2  35.8   Platelets 150 - 400 K/uL 199  155  185       Latest Ref Rng & Units 09/07/2022    9:52 AM 09/04/2022   10:07 PM 09/02/2022   10:02 AM  CMP  Glucose 70 - 99 mg/dL 90  118    BUN 8 - 23 mg/dL 12  17    Creatinine 0.44 - 1.00 mg/dL 1.01  1.00  0.90   Sodium 135 - 145 mmol/L 135  138    Potassium 3.5 -  5.1 mmol/L 3.9  3.7    Chloride 98 - 111 mmol/L 102  104    CO2 22 - 32 mmol/L 25  25    Calcium 8.9 - 10.3 mg/dL 9.0  8.8    Total Protein 6.5 - 8.1 g/dL 7.9     Total Bilirubin 0.3 - 1.2 mg/dL 0.5     Alkaline Phos 38 - 126 U/L 62     AST 15 - 41 U/L 14     ALT 0 - 44 U/L 9          RADIOGRAPHIC STUDIES: I have personally reviewed the radiological images as listed and agreed with the findings in the report.  DG Chest Portable 1 View  Result Date: 09/04/2022 CLINICAL DATA:  Weakness EXAM: PORTABLE CHEST 1 VIEW COMPARISON:  CT from 09/02/2022 FINDINGS: Cardiac shadow is within normal limits. Aortic calcifications are noted. Right-sided chest wall port is seen in satisfactory position. Lungs are clear bilaterally. Changes of prior vertebral augmentation are noted in the midthoracic spine. No acute bony abnormality is noted. IMPRESSION: No acute abnormality noted. Electronically Signed   By: Inez Catalina M.D.   On: 09/04/2022 22:59   CT CHEST ABDOMEN PELVIS W CONTRAST  Addendum Date: 09/03/2022   ADDENDUM REPORT: 09/03/2022 10:22 ADDENDUM: Critical Value/emergent results were called by telephone at the time of interpretation on 09/03/2022 at 10:22 am to provider [facility] , who verbally acknowledged these results. Electronically Signed   By: Kerby Moors M.D.   On: 09/03/2022 10:22   Result Date: 09/03/2022 CLINICAL DATA:  Restaging small cell lung cancer EXAM: CT CHEST, ABDOMEN, AND PELVIS WITH CONTRAST TECHNIQUE: Multidetector CT imaging of the chest, abdomen and pelvis was performed following the standard protocol during bolus administration of intravenous contrast. RADIATION DOSE REDUCTION: This exam was performed according to the departmental dose-optimization program which includes automated exposure control, adjustment of the mA and/or kV according to patient size and/or use of iterative reconstruction technique. body oncCONTRAST:  170mL OMNIPAQUE IOHEXOL 300 MG/ML  SOLN COMPARISON:   06/15/2022 FINDINGS: CT CHEST FINDINGS Cardiovascular: Heart size appears normal. Aortic atherosclerosis. Coronary artery calcifications. No pericardial effusion. Incidental Pulmonary embolus identified within posterior basal segmental branches of the right lower lobe pulmonary artery, image 69/6, image 75/5 and image 81/5. Mediastinum/Nodes: No enlarged mediastinal, hilar, or axillary lymph nodes. Thyroid gland,  trachea, and esophagus demonstrate no significant findings. Lungs/Pleura: No pleural effusion identified. Paraseptal and centrilobular emphysema identified. Bilateral peripheral and lower lung zone predominant subpleural scarring is noted, likely post infectious or inflammatory in etiology. Stable mild pleural thickening overlying the posterior left lower lobe. Calcified granuloma identified within the right upper lobe, image 34/4. Calcified granuloma in the right middle lobe is also noted, image 94/4. No suspicious lung nodules identified at this time. Musculoskeletal: Unchanged treated compression deformity involving the T7 vertebra. Stable inferior endplate deformity involving the T4 vertebra. Chronic displaced fracture involving the proximal body of sternum is unchanged. No acute or suspicious osseous abnormality. CT ABDOMEN PELVIS FINDINGS Hepatobiliary: No focal liver abnormality is seen. Status post cholecystectomy. No biliary dilatation. Pancreas: Unremarkable. No pancreatic ductal dilatation or surrounding inflammatory changes. Spleen: Calcified granuloma identified within the spleen. Spleen otherwise unremarkable. Adrenals/Urinary Tract: Adrenal glands are unremarkable. Kidneys are normal, without renal calculi, focal lesion, or hydronephrosis. Bladder is unremarkable. Stomach/Bowel: Stomach appears within normal limits. The appendix is not clearly visualized. No bowel wall thickening, inflammation, or distension. Sigmoid diverticulosis without signs of acute diverticulitis. Vascular/Lymphatic:  Aortic atherosclerosis. No signs of abdominopelvic adenopathy. Reproductive: Uterus and bilateral adnexa are unremarkable. Other: No free fluid or fluid collections. Previous ventral abdominal wall herniorrhaphy. Musculoskeletal: No acute or significant osseous findings. IMPRESSION: 1. Stable CT of the chest, abdomen and pelvis. No findings to suggest recurrent or metastatic disease. 2. Incidental acute pulmonary embolus identified within segmental branches of the right lower lobe pulmonary artery. 3. Stable compression deformities within the thoracic spine. 4. 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 Aortic Atherosclerosis (ICD10-I70.0) and Emphysema (ICD10-J43.9). Electronically Signed: By: Kerby Moors M.D. On: 09/03/2022 10:03   MR Brain W Wo Contrast  Result Date: 07/23/2022 CLINICAL DATA:  Unsteady gait and memory loss. History of lung cancer EXAM: MRI HEAD WITHOUT AND WITH CONTRAST TECHNIQUE: Multiplanar, multiecho pulse sequences of the brain and surrounding structures were obtained without and with intravenous contrast. CONTRAST:  45mL GADAVIST GADOBUTROL 1 MMOL/ML IV SOLN COMPARISON:  03/13/2022 FINDINGS: Brain: No enhancement or swelling to suggest metastatic disease. No acute or subacute infarction, hemorrhage, hydrocephalus, extra-axial collection or mass lesion. Chronic small vessel ischemia with confluent extensive white matter involvement, question prior whole-brain radiotherapy given small cell histology. 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 Vascular: Major flow voids and vascular enhancements are preserved Skull and upper cervical spine: Normal marrow signal Sinuses/Orbits: Negative IMPRESSION: 1. Negative for metastatic disease. 2. Severe chronic small vessel disease. Electronically Signed   By: Jorje Guild M.D.   On: 07/23/2022 16:20   CT CHEST ABDOMEN PELVIS W CONTRAST  Result Date:  06/15/2022 CLINICAL DATA:  Metastatic lung cancer restaging, brain metastases * Tracking Code: BO * EXAM: CT CHEST, ABDOMEN, AND PELVIS WITH CONTRAST TECHNIQUE: Multidetector CT imaging of the chest, abdomen and pelvis was performed following the standard protocol during bolus administration of intravenous contrast. RADIATION DOSE REDUCTION: This exam was performed according to the departmental dose-optimization program which includes automated exposure control, adjustment of the mA and/or kV according to patient size and/or use of iterative reconstruction technique. CONTRAST:  181mL OMNIPAQUE IOHEXOL 300 MG/ML SOLN additional oral enteric contrast COMPARISON:  PET-CT, 11/21/2021, CT abdomen pelvis, 03/24/2022, CT chest angiogram, 03/13/2022 FINDINGS: CT CHEST FINDINGS Cardiovascular: Right chest port catheter. Aortic atherosclerosis. Normal heart size. Three-vessel coronary artery calcifications. No pericardial effusion. Mediastinum/Nodes: No enlarged mediastinal, hilar, or axillary lymph nodes. Thyroid gland, trachea,  and esophagus demonstrate no significant findings. Lungs/Pleura: Mild centrilobular and paraseptal emphysema. Unchanged, bandlike scarring of the bilateral lower lobes (series 5, image 100). No pleural effusion or pneumothorax. Musculoskeletal: No chest wall mass or suspicious osseous lesions identified. Unchanged inferior endplate T4 and high-grade T7 wedge deformities status post T7 vertebral cement augmentation. Chronically displaced, sclerotic fracture of the superior sternal body (series 7, image 82). CT ABDOMEN PELVIS FINDINGS Hepatobiliary: No focal liver abnormality is seen. Status post cholecystectomy. No biliary dilatation. Pancreas: Unremarkable. No pancreatic ductal dilatation or surrounding inflammatory changes. Spleen: Normal in size without significant abnormality. Adrenals/Urinary Tract: Adrenal glands are unremarkable. Kidneys are normal, without renal calculi, solid lesion, or  hydronephrosis. Bladder is unremarkable. Stomach/Bowel: Stomach is within normal limits. Appendix is not clearly visualized. No evidence of bowel wall thickening, distention, or inflammatory changes. Vascular/Lymphatic: Aortic atherosclerosis. No enlarged abdominal or pelvic lymph nodes. Reproductive: No mass or other abnormality. Other: Ventral hernia mesh repair.  No ascites. Musculoskeletal: No acute osseous findings. IMPRESSION: 1. No CT evidence of recurrent or metastatic disease in the chest, abdomen, or pelvis. 2. Unchanged, bandlike scarring of the bilateral lower lobes. 3. Emphysema. 4. Unchanged inferior endplate T4 and high-grade T7 wedge deformities status post T7 vertebral cement augmentation. Chronically displaced, sclerotic fracture of the superior sternal body. 5. Coronary artery disease. Aortic Atherosclerosis (ICD10-I70.0). Electronically Signed   By: Delanna Ahmadi M.D.   On: 06/15/2022 16:31

## 2022-09-07 NOTE — Progress Notes (Signed)
Noticing SOB in the evenings.  CT results.  Was in the ED 2 09/05/22.

## 2022-09-07 NOTE — Assessment & Plan Note (Signed)
bone metastasis and Osteopenia previously on zometa every 4-6 weeks. .  Continue calcium supplementation.  Plan to resume Zometa.

## 2022-09-07 NOTE — Assessment & Plan Note (Addendum)
#  Acute pulmonary embolism, unprovoked, recurrent. Continue Eliquis for anticoagulation. Stop aspirin 325 mg daily

## 2022-09-09 ENCOUNTER — Ambulatory Visit
Admission: RE | Admit: 2022-09-09 | Discharge: 2022-09-09 | Disposition: A | Discharge status: Home / Self Care | Payer: Medicare Other | Source: Ambulatory Visit | Attending: Oncology | Admitting: Oncology

## 2022-09-09 DIAGNOSIS — Z86711 Personal history of pulmonary embolism: Secondary | ICD-10-CM | POA: Diagnosis not present

## 2022-09-09 DIAGNOSIS — M7989 Other specified soft tissue disorders: Secondary | ICD-10-CM | POA: Diagnosis not present

## 2022-09-16 ENCOUNTER — Telehealth: Payer: Self-pay

## 2022-09-16 NOTE — Patient Outreach (Signed)
  Care Coordination   09/16/2022 Name: DIM MEISINGER MRN: [id number] DOB: 1946/01/27   Care Coordination Outreach Attempts:  An unsuccessful telephone outreach was attempted today to offer the patient information about available care coordination services as a benefit of their health plan.   Follow Up Plan:  Additional outreach attempts will be made to offer the patient care coordination information and services.   Encounter Outcome:  No Answer  Care Coordination Interventions Activated:  No   Care Coordination Interventions:  No, not indicated   Noreene Larsson RN, MSN, [facility]  Mobile: [phone]

## 2022-09-17 ENCOUNTER — Other Ambulatory Visit: Payer: Self-pay

## 2022-09-17 ENCOUNTER — Telehealth: Payer: Self-pay

## 2022-09-17 DIAGNOSIS — R531 Weakness: Secondary | ICD-10-CM

## 2022-09-17 DIAGNOSIS — C3490 Malignant neoplasm of unspecified part of unspecified bronchus or lung: Secondary | ICD-10-CM

## 2022-09-17 NOTE — Telephone Encounter (Signed)
[facility] orders for Nursing and PT entered in Epic.

## 2022-09-29 ENCOUNTER — Inpatient Hospital Stay: Payer: Medicare Other

## 2022-09-29 ENCOUNTER — Encounter: Payer: Self-pay | Admitting: Oncology

## 2022-09-29 ENCOUNTER — Inpatient Hospital Stay: Payer: Medicare Other | Attending: Oncology | Admitting: Oncology

## 2022-09-29 VITALS — BP 106/75 | HR 73 | Resp 18 | Ht 67.0 in | Wt 137.0 lb

## 2022-09-29 DIAGNOSIS — C3490 Malignant neoplasm of unspecified part of unspecified bronchus or lung: Secondary | ICD-10-CM | POA: Diagnosis not present

## 2022-09-29 DIAGNOSIS — M8580 Other specified disorders of bone density and structure, unspecified site: Secondary | ICD-10-CM | POA: Insufficient documentation

## 2022-09-29 DIAGNOSIS — Z79899 Other long term (current) drug therapy: Secondary | ICD-10-CM | POA: Diagnosis not present

## 2022-09-29 DIAGNOSIS — I2699 Other pulmonary embolism without acute cor pulmonale: Secondary | ICD-10-CM

## 2022-09-29 DIAGNOSIS — Z5112 Encounter for antineoplastic immunotherapy: Secondary | ICD-10-CM | POA: Insufficient documentation

## 2022-09-29 DIAGNOSIS — Z8589 Personal history of malignant neoplasm of other organs and systems: Secondary | ICD-10-CM | POA: Diagnosis not present

## 2022-09-29 DIAGNOSIS — E860 Dehydration: Secondary | ICD-10-CM

## 2022-09-29 LAB — COMPREHENSIVE METABOLIC PANEL
ALT: 10 U/L (ref 0–44)
AST: 21 U/L (ref 15–41)
Albumin: 3.6 g/dL (ref 3.5–5.0)
Alkaline Phosphatase: 47 U/L (ref 38–126)
Anion gap: 6 (ref 5–15)
BUN: 12 mg/dL (ref 8–23)
CO2: 22 mmol/L (ref 22–32)
Calcium: 8.9 mg/dL (ref 8.9–10.3)
Chloride: 109 mmol/L (ref 98–111)
Creatinine, Ser: 0.75 mg/dL (ref 0.44–1.00)
GFR, Estimated: >60 mL/min (ref >60)
Glucose, Bld: 103 mg/dL — ABNORMAL HIGH (ref 70–99)
Potassium: 3.8 mmol/L (ref 3.5–5.1)
Sodium: 137 mmol/L (ref 135–145)
Total Bilirubin: 0.6 mg/dL (ref 0.3–1.2)
Total Protein: 7 g/dL (ref 6.5–8.1)

## 2022-09-29 LAB — CBC WITH DIFFERENTIAL/PLATELET
Abs Immature Granulocytes: 0.06 10*3/uL (ref 0.00–0.07)
Basophils Absolute: 0.1 10*3/uL (ref 0.0–0.1)
Basophils Relative: 1 %
Eosinophils Absolute: 0.3 10*3/uL (ref 0.0–0.5)
Eosinophils Relative: 6 %
HCT: 35.5 % — ABNORMAL LOW (ref 36.0–46.0)
Hemoglobin: 11.6 g/dL — ABNORMAL LOW (ref 12.0–15.0)
Immature Granulocytes: 1 %
Lymphocytes Relative: 21 %
Lymphs Abs: 1.3 10*3/uL (ref 0.7–4.0)
MCH: 27.9 pg (ref 26.0–34.0)
MCHC: 32.7 g/dL (ref 30.0–36.0)
MCV: 85.3 fL (ref 80.0–100.0)
Monocytes Absolute: 0.8 10*3/uL (ref 0.1–1.0)
Monocytes Relative: 13 %
Neutro Abs: 3.6 10*3/uL (ref 1.7–7.7)
Neutrophils Relative %: 58 %
Platelets: 171 10*3/uL (ref 150–400)
RBC: 4.16 MIL/uL (ref 3.87–5.11)
RDW: 15.6 % — ABNORMAL HIGH (ref 11.5–15.5)
WBC: 6.1 10*3/uL (ref 4.0–10.5)
nRBC: 0 % (ref 0.0–0.2)

## 2022-09-29 MED ORDER — HEPARIN SOD (PORK) LOCK FLUSH 100 UNIT/ML IV SOLN
INTRAVENOUS | Status: AC
  Filled 2022-09-29: qty 5

## 2022-09-29 MED ORDER — ZOLEDRONIC ACID 4 MG/5ML IV CONC
3.3000 mg | Freq: Once | INTRAVENOUS | Status: AC
  Administered 2022-09-29: 3.3 mg via INTRAVENOUS
  Filled 2022-09-29: qty 4.13

## 2022-09-29 MED ORDER — SODIUM CHLORIDE 0.9 % IV SOLN
1200.0000 mg | Freq: Once | INTRAVENOUS | Status: AC
  Administered 2022-09-29: 1200 mg via INTRAVENOUS
  Filled 2022-09-29: qty 20

## 2022-09-29 MED ORDER — SODIUM CHLORIDE 0.9 % IV SOLN
Freq: Once | INTRAVENOUS | Status: AC
  Filled 2022-09-29: qty 250

## 2022-09-29 MED ORDER — APIXABAN 5 MG PO TABS: 5.0000 mg | ORAL_TABLET | Freq: Two times a day (BID) | ORAL | 5 refills | Status: DC

## 2022-09-29 MED ORDER — HEPARIN SOD (PORK) LOCK FLUSH 100 UNIT/ML IV SOLN
500.0000 [IU] | Freq: Once | INTRAVENOUS | Status: AC | PRN
  Administered 2022-09-29: 500 [IU]
  Filled 2022-09-29: qty 5

## 2022-09-29 NOTE — Assessment & Plan Note (Addendum)
#  Acute pulmonary embolism, unprovoked, recurrent. Continue Eliquis 5 mg twice daily for anticoagulation. Stop aspirin 325 mg daily

## 2022-09-29 NOTE — Patient Instructions (Signed)
[facility]  Discharge Instructions: Thank you for choosing [facility] to provide your oncology and hematology care.  If you have a lab appointment with the [facility], please go directly to the [facility] and check in at the registration area.  Wear comfortable clothing and clothing appropriate for easy access to any Portacath or PICC line.   We strive to give you quality time with your provider. You may need to reschedule your appointment if you arrive late (15 or more minutes).  Arriving late affects you and other patients whose appointments are after yours.  Also, if you miss three or more appointments without notifying the office, you may be dismissed from the clinic at the provider's discretion.      For prescription refill requests, have your pharmacy contact our office and allow 72 hours for refills to be completed.    Today you received the following chemotherapy and/or immunotherapy agents Tecentriq, zometa   To help prevent nausea and vomiting after your treatment, we encourage you to take your nausea medication as directed.  BELOW ARE SYMPTOMS THAT SHOULD BE REPORTED IMMEDIATELY: *FEVER GREATER THAN 100.4 F (38 C) OR HIGHER *CHILLS OR SWEATING *NAUSEA AND VOMITING THAT IS NOT CONTROLLED WITH YOUR NAUSEA MEDICATION *UNUSUAL SHORTNESS OF BREATH *UNUSUAL BRUISING OR BLEEDING *URINARY PROBLEMS (pain or burning when urinating, or frequent urination) *BOWEL PROBLEMS (unusual diarrhea, constipation, pain near the anus) TENDERNESS IN MOUTH AND THROAT WITH OR WITHOUT PRESENCE OF ULCERS (sore throat, sores in mouth, or a toothache) UNUSUAL RASH, SWELLING OR PAIN  UNUSUAL VAGINAL DISCHARGE OR ITCHING   Items with * indicate a potential emergency and should be followed up as soon as possible or go to the Emergency Department if any problems should occur.  Please show the CHEMOTHERAPY ALERT CARD or IMMUNOTHERAPY ALERT CARD at check-in  to the Emergency Department and triage nurse.  Should you have questions after your visit or need to cancel or reschedule your appointment, please contact [facility] [place] [facility] [place]  [phone] and follow the prompts.  Office hours are 8:00 a.m. to 4:30 p.m. Monday - Friday. Please note that voicemails left after 4:00 p.m. may not be returned until the following business day.  We are closed weekends and major holidays. You have access to a nurse at all times for urgent questions. Please call the main number to the clinic [phone] and follow the prompts.  For any non-urgent questions, you may also contact your provider using MyChart. We now offer e-Visits for anyone 6 and older to request care online for non-urgent symptoms. For details visit [email or web address].   Also download the MyChart app! Go to the app store, search "MyChart", open the app, select [facility], and log in with your MyChart username and password.  Masks are optional in the cancer centers. If you would like for your care team to wear a mask while they are taking care of you, please let them know. For doctor visits, patients may have with them one support person who is at least 76 years old. At this time, visitors are not allowed in the infusion area.

## 2022-09-29 NOTE — Progress Notes (Signed)
Patient reports having a lot of gas and occasional nausea - meds help.

## 2022-09-29 NOTE — Assessment & Plan Note (Addendum)
#  Extensive small cell lung cancer, S/p 4 cycles of Carboplatin, Etoposide and Tecentriq.   Previously Tecentriq maintenance. 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  Diarrhea has completely resolved. CT chest abdomen pelvis imaging and MRI brain recently showed no evidence of cancer recurrence. Labs reviewed and discussed with patient, proceed with Tecentriq today.

## 2022-09-29 NOTE — Progress Notes (Signed)
Hematology/Oncology Progress note [phone] [phone]      Patient Care Team: Glean Hess, MD as PCP - General (Internal Medicine) Charolette Forward, MD as Consulting Physician (Cardiology) Telford Nab, RN as Registered Nurse [name], MD as Radiation Oncologist (Radiation Oncology)   ASSESSMENT & PLAN:     Small cell lung cancer ([place]) #Extensive small cell lung cancer, S/p 4 cycles of Carboplatin, Etoposide and Tecentriq.   Previously Tecentriq maintenance. 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  Diarrhea has completely resolved. CT chest abdomen pelvis imaging and MRI brain recently showed no evidence of cancer recurrence. Labs reviewed and discussed with patient, proceed with Tecentriq today.  Pulmonary embolus ([facility]) #Acute pulmonary embolism, unprovoked, recurrent. Continue Eliquis for anticoagulation. 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 .  Continue calcium supplementation.    Encounter for antineoplastic immunotherapy Immunotherapy treatment as listed above.  Constipation, recommend patient to increase dietary fiber.  She may also utilize stool softener as needed.  Orders Placed This Encounter  Procedures   TSH    Standing Status:   Future    Standing Expiration Date:   11/11/2023   CBC with Differential    Standing Status:   Future    Standing Expiration Date:   11/11/2023   Comprehensive metabolic panel    Standing Status:   Future    Standing Expiration Date:   11/11/2023   Follow-up in 3 weeks to Lab MD Tecentriq treatments. All questions were answered. The patient knows to call the clinic with any problems, questions or concerns.  Earlie Server, MD, PhD [facility] Hematology Oncology 09/29/2022      REASON FOR VISIT:  Follow-up for small cell lung cancer,   HISTORY OF PRESENTING ILLNESS:  Kristi [name] is a  76 y.o.  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  10/20/2018 CT chest with contrast showed large mediastinal mass involving both hilar, left greater than right, consistent with lung carcinoma, The mass causes narrowing of the left mainstem bronchus with resultant volume loss on the left and a mediastinal shift to the left.  Moderate size left pleural effusion. 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  # Initial MRI brain negative.  # Nov 2019- Jan 2020 s/p 4 cycles of Carbo/Etoposide/Tecentriq #  01/24/2019 interim CT scan done which showed continued positive response to therapy with continued reduction in mediastinal adenopathy.  No residual measurable left lung mass. CT findings of acute emphysematous cystitis. Urology did not feel that patient has pyelonephritis and recommend treatment with antibiotics because patient's immunocompromise. Patient finished treatment.   # 11/02/2018-01/09/2019 Chemotherapy carboplatin + Etoposide + tecentriq # Consolidation chest radiation and whole brain radiation finished in May 2020 # 04/25/2019 resume Tecentriq every 3 weeks. # Patient had a fall from her stairs on 01/19/2020. In the emergency room she had CT chest abdomen pelvis CT, CT head without contrast, CT cervical spine without contrast. No CT evidence for acute intracranial abnormality, degenerative changes of cervical spine..  No CT evidence of acute thoracic abdomen injury.  Severe compression fracture of T7, subacute.  # kyphoplasty procedure on 03/01/2020.  T7 biopsy negative for cancer.  11/20/2020 Surveillance CT scan  Reduced size and prominence of right lateral lung base subpleural nodular density. No findings of active malignancy.  MRI brain showed stable post treatment brain. No metastatic disease.   # 10/29/2021 MRI brain w and wo contrast showed  two definite new enhancing lesions in cerebellum, with associated restricted diffusion and increased T2 signal, concerning for new foci of metastatic disease. 2. A third lesion in the right cerebellar hemisphere was likely present on the prior exam but continues to show limited contrast enhancement on SPGR; this lesion is associated with diffusion restriction and increased T2 signal, but, on coronal and sagittal T1 sequences, it correlates with an area affected by pulsation artifact. This is also felt to represent a new focus of metastatic disease.  11/20/2021 PET scan - no FDG avid disease in neck chest abdomen or pelvis.  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 No enhancing metastatic disease seen today, chronic small vessel ischemia.   12/19/2021 was seen by neurology Dr.Vaslow. increase ASA to 325mg  daily. Atorvastatin.  40 mg  03/13/2022 - 03/18/2022, patient was hospitalized due to lower extremity weakness CT head did not show any abnormality.  MRI brain confirmed no evidence of new CVA.  Patient was seen by neurologist and weakness was felt to be multifactorial. CT chest angiogram protocol showed bilateral pulmonary emboli, patient was started on heparin drip and transition to Xarelto at discharge. For 03/25/2022-4 /10/2022, patient was readmitted due to positive blood culture which was done during her previous admission.  Blood culture was positive for Streptococcus species in both bottles.  Patient had soft blood pressures initially and was given supportive care and IV antibiotics.  She completed antibiotic course.  At discharge, she was recommended to continue anticoagulation.  Both patient and husband are not aware about diagnosis of pulmonary embolism and not aware about new prescription of Xarelto. Xarelto appeared to be prescribed and prescription was printed out.  Patient and  husband both not aware about the new prescription.  Patient has been off anticoagulation presumably since 03/31/2022.  Occasionally she has shortness of breath.  No chest pain.  She reports feeling 100% better today.  #09/03/2022 recurrent pulmonary embolism due to patient ran out of Eliquis.  Restarted on Eliquis  INTERVAL HISTORY Kristi [name] is a 76 y.o. female who has above history reviewed by me today presents for evaluation prior to immunotherapy for treatment of extensive small cell lung cancer Patient was on Eliquis 5 mg twice daily.  She tolerates well.  She reports feeling well.  Diarrhea has completely resolved. She feels gassy, + occasional nausea.  No vomiting or diarrhea.  She is constipated, bowel movement every 2 to 3 days.  Review of Systems  Constitutional:  Positive for fatigue. Negative for appetite change, chills and fever.  HENT:   Negative for hearing loss and voice change.   Eyes:  Negative for eye problems.  Respiratory:  Positive for shortness of breath. Negative for chest tightness and cough.   Cardiovascular:  Negative for chest pain.  Gastrointestinal:  Negative for abdominal distention, abdominal pain, blood in stool, diarrhea and nausea.  Endocrine: Negative for hot flashes.  Genitourinary:  Negative for difficulty urinating and frequency.   Musculoskeletal:  Negative for arthralgias and back pain.       Lower extremity weakness  Skin:  Negative for itching and rash.  Neurological:  Negative for extremity weakness, headaches and light-headedness.  Hematological:  Negative for adenopathy.  Psychiatric/Behavioral:  Negative for confusion and depression. The patient is not nervous/anxious.  Forgetful     MEDICAL HISTORY:  Past Medical History:  Diagnosis Date   Acute MI, inferoposterior wall ([place]) 09/30/2014   1 stent   Claustrophobia    Coronary artery disease    GERD (gastroesophageal reflux disease)    Hypercholesteremia    MI, old     Pneumonia    Small cell lung cancer ([place])    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 ([place]) 10/27/2018    SURGICAL HISTORY: Past Surgical History:  Procedure Laterality Date   APPENDECTOMY     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   BLADDER NECK SUSPENSION     CHOLECYSTECTOMY N/A 08/14/2018   Procedure: LAPAROSCOPIC CHOLECYSTECTOMY;  Surgeon: Jules Husbands, MD;  Location: [facility] ORS;  Service: General;  Laterality: N/A;   CHOLECYSTECTOMY  11/2018   CORONARY ANGIOPLASTY  09/29/2014   CORONARY STENT PLACEMENT     ENDOBRONCHIAL ULTRASOUND N/A 10/21/2018   Procedure: ENDOBRONCHIAL ULTRASOUND;  Surgeon: Laverle Hobby, MD;  Location: [facility] ORS;  Service: Pulmonary;  Laterality: N/A;   HERNIA REPAIR  2012   IR FLUORO GUIDE CV LINE RIGHT  12/19/2018   KYPHOPLASTY N/A 03/01/2020   Procedure: T7 KYPHOPLASTY;  Surgeon: Hessie Knows, MD;  Location: [facility] ORS;  Service: Orthopedics;  Laterality: N/A;   LAPAROTOMY     LEFT HEART CATHETERIZATION WITH CORONARY ANGIOGRAM N/A 09/29/2014   Procedure: LEFT HEART CATHETERIZATION WITH CORONARY ANGIOGRAM;  Surgeon: Clent Demark, MD;  Location: [place] CATH LAB;  Service: Cardiovascular;  Laterality: N/A;   PORTA CATH INSERTION N/A 01/02/2019   Procedure: PORTA CATH INSERTION;  Surgeon: Algernon Huxley, MD;  Location: [place] [facility];  Service: Cardiovascular;  Laterality: N/A;   PORTACATH PLACEMENT Right 10/28/2018   Procedure: INSERTION PORT-A-CATH;  Surgeon: Jules Husbands, MD;  Location: [facility] ORS;  Service: General;  Laterality: Right;   TEE WITHOUT CARDIOVERSION Right 03/30/2022   Procedure: TRANSESOPHAGEAL ECHOCARDIOGRAM (TEE);  Surgeon: Dionisio David, MD;  Location: [facility] ORS;  Service: Cardiovascular;  Laterality: Right;   XI ROBOTIC ASSISTED VENTRAL HERNIA N/A 02/27/2021   Procedure: XI ROBOTIC ASSISTED VENTRAL HERNIA (incisional) WITH MESH;  Surgeon: Olean Ree, MD;  Location: [facility] ORS;  Service: General;  Laterality: N/A;    SOCIAL HISTORY: Social History    Socioeconomic History   Marital status: Married    Spouse name: [name]    Number of children: 3   Years of education: Not on file   Highest education level: Not on file  Occupational History   Occupation: Retired    Comment: Chief Technology Officer   Tobacco Use   Smoking status: Former    Packs/day: 1.00    Years: 39.00    Total pack years: 39.00    Types: Cigarettes    Start date: 12/21/1978    Quit date: 10/16/2018    Years since quitting: 3.9   Smokeless tobacco: Never  Vaping Use   Vaping Use: Never used  Substance and Sexual Activity   Alcohol use: Yes    Alcohol/week: 0.0 standard drinks of alcohol    Comment: occassional - approx 1 every 2 weeks    Drug use: No   Sexual activity: Yes    Birth control/protection: None  Other Topics Concern   Not on file  Social History Narrative   Not on file   Social Determinants of Health   Financial Resource Strain: Low Risk  (01/02/2019)   Overall Financial Resource Strain (CARDIA)    Difficulty of Paying Living Expenses: Not very  hard  Food Insecurity: Not on file  Transportation Needs: No Transportation Needs (01/02/2019)   PRAPARE - Hydrologist (Medical): No    Lack of Transportation (Non-Medical): No  Physical Activity: Unknown (01/02/2019)   Exercise Vital Sign    Days of Exercise per Week: 0 days    Minutes of Exercise per Session: Not on file  Stress: Stress Concern Present (01/02/2019)   [place]    Feeling of Stress : To some extent  Social Connections: Unknown (01/02/2019)   Social Connection and Isolation Panel [NHANES]    Frequency of Communication with Friends and Family: Three times a week    Frequency of Social Gatherings with Friends and Family: Once a week    Attends Religious Services: 1 to 4 times per year    Active Member of [employer] or Organizations: Not on file    Attends Archivist Meetings: Not on file     Marital Status: Married  Intimate Partner Violence: Not At Risk (01/02/2019)   Humiliation, Afraid, Rape, and Kick questionnaire    Fear of Current or Ex-Partner: No    Emotionally Abused: No    Physically Abused: No    Sexually Abused: No    FAMILY HISTORY: Family History  Problem Relation Age of Onset   Alzheimer's disease Mother    Colon cancer Father    Breast cancer Neg Hx     ALLERGIES:  has No Known Allergies.  MEDICATIONS:  Current Outpatient Medications  Medication Sig Dispense Refill   acetaminophen (TYLENOL) 500 MG tablet Take 2 tablets (1,000 mg total) by mouth every 6 (six) hours as needed for mild pain or headache.     alum & mag hydroxide-simeth (MAALOX/MYLANTA) 200-200-20 MG/5ML suspension Take 30 mLs by mouth every 6 (six) hours as needed for indigestion or heartburn. 355 mL 0   apixaban (ELIQUIS) 5 MG TABS tablet Take 1 tablet (5 mg total) by mouth 2 (two) times daily. 60 tablet 5   atorvastatin (LIPITOR) 40 MG tablet Take 1 tablet (40 mg total) by mouth daily. 30 tablet 3   Calcium 600-200 MG-UNIT tablet Take 1 tablet by mouth 2 (two) times daily.     Cholecalciferol (DIALYVITE VITAMIN D 5000) 125 MCG (5000 UT) capsule Take 5,000 Units by mouth daily.     diphenhydrAMINE (BENADRYL) 25 mg capsule Take 1 capsule (25 mg total) by mouth at bedtime as needed (if no sleep even with trazodone). 30 capsule 0   Magnesium 250 MG TABS Take 250 mg by mouth daily.     midodrine (PROAMATINE) 5 MG tablet Take 1 tablet (5 mg total) by mouth 3 (three) times daily with meals. 90 tablet 1   oxyCODONE (OXY IR/ROXICODONE) 5 MG immediate release tablet Take 1 tablet (5 mg total) by mouth every 12 (twelve) hours as needed for severe pain or moderate pain. 30 tablet 0   pantoprazole (PROTONIX) 40 MG tablet Take 1 tablet (40 mg total) by mouth 2 (two) times daily. (Patient taking differently: Take 40 mg by mouth 2 (two) times daily as needed.) 60 tablet 0   sertraline (ZOLOFT) 100 MG tablet  Take 1 tablet (100 mg total) by mouth daily. 90 tablet 3   vitamin B-12 (CYANOCOBALAMIN) 1000 MCG tablet Take 1,000 mcg by mouth daily.     lidocaine (LIDODERM) 5 % Place 1 patch onto the skin daily. Remove & Discard patch within 12 hours or as directed  by MD (Patient not taking: Reported on 04/27/2022) 30 patch 0   polyethylene glycol (MIRALAX / GLYCOLAX) 17 g packet Take 17 g by mouth daily as needed for mild constipation. (Patient not taking: Reported on 06/01/2022) 14 each 0   prochlorperazine (COMPAZINE) 10 MG tablet Take 10 mg by mouth every 6 (six) hours as needed for nausea or vomiting. (Patient not taking: Reported on 09/29/2022)     sucralfate (CARAFATE) 1 g tablet Take 1 g by mouth 3 (three) times daily as needed (heartburn). (Patient not taking: Reported on 09/29/2022)     traZODone (DESYREL) 50 MG tablet Take 1 tablet (50 mg total) by mouth at bedtime as needed for sleep. (Patient not taking: Reported on 09/29/2022) 30 tablet 2   No current facility-administered medications for this visit.   Facility-Administered Medications Ordered in Other Visits  Medication Dose Route Frequency Provider Last Rate Last Admin   heparin lock flush 100 unit/mL  500 Units Intravenous Once Earlie Server, MD       heparin lock flush 100 unit/mL  500 Units Intravenous Once Earlie Server, MD       sodium chloride flush (NS) 0.9 % injection 10 mL  10 mL Intravenous PRN Earlie Server, MD   10 mL at 01/09/19 0820   sodium chloride flush (NS) 0.9 % injection 10 mL  10 mL Intravenous PRN Earlie Server, MD   10 mL at 01/10/19 1400     PHYSICAL EXAMINATION: ECOG PERFORMANCE STATUS: 1 - Symptomatic but completely ambulatory Vitals:   09/29/22 1008  BP: 106/75  Pulse: 73  Resp: 18  SpO2: 98%   Filed Weights   09/29/22 1003  Weight: 137 lb (62.1 kg)   Physical Examination Today's Vitals   09/29/22 1003 09/29/22 1005 09/29/22 1008  BP:   106/75  Pulse:   73  Resp:   18  SpO2:   98%  Weight: 137 lb (62.1 kg)    Height:    5\' 7"  (1.702 m)  PainSc:  0-No pain    Body mass index is 21.46 kg/m.   Physical Exam Constitutional:      General: She is not in acute distress.    Appearance: She is not diaphoretic.     Comments: Patient sits in the wheelchair Frail appearance  HENT:     Head: Normocephalic and atraumatic.     Nose: Nose normal.     Mouth/Throat:     Pharynx: No oropharyngeal exudate.  Eyes:     General: No scleral icterus.    Pupils: Pupils are equal, round, and reactive to light.  Cardiovascular:     Rate and Rhythm: Normal rate and regular rhythm.     Heart sounds: No murmur heard. Pulmonary:     Effort: Pulmonary effort is normal. No respiratory distress.     Breath sounds: No wheezing or rales.     Comments: Decreased breath sounds bilaterally.  Abdominal:     General: There is no distension.     Palpations: Abdomen is soft.     Tenderness: There is no abdominal tenderness.  Musculoskeletal:        General: Normal range of motion.     Cervical back: Normal range of motion and neck supple.  Skin:    General: Skin is warm and dry.     Findings: No erythema.  Neurological:     Mental Status: She is alert and oriented to person, place, and time.     Cranial Nerves: No cranial  nerve deficit.     Motor: No abnormal muscle tone.     Coordination: Coordination normal.  Psychiatric:        Mood and Affect: Affect normal.        LABORATORY DATA:  I have reviewed the data as listed    Latest Ref Rng & Units 09/29/2022    9:46 AM 09/07/2022    9:52 AM 09/04/2022   10:07 PM  CBC  WBC 4.0 - 10.5 K/uL 6.1  5.9  8.9   Hemoglobin 12.0 - 15.0 g/dL 11.6  11.9  11.2   Hematocrit 36.0 - 46.0 % 35.5  36.2  35.2   Platelets 150 - 400 K/uL 171  199  155       Latest Ref Rng & Units 09/29/2022    9:46 AM 09/07/2022    9:52 AM 09/04/2022   10:07 PM  CMP  Glucose 70 - 99 mg/dL 103  90  118   BUN 8 - 23 mg/dL 12  12  17    Creatinine 0.44 - 1.00 mg/dL 0.75  1.01  1.00   Sodium 135 -  145 mmol/L 137  135  138   Potassium 3.5 - 5.1 mmol/L 3.8  3.9  3.7   Chloride 98 - 111 mmol/L 109  102  104   CO2 22 - 32 mmol/L 22  25  25    Calcium 8.9 - 10.3 mg/dL 8.9  9.0  8.8   Total Protein 6.5 - 8.1 g/dL 7.0  7.9    Total Bilirubin 0.3 - 1.2 mg/dL 0.6  0.5    Alkaline Phos 38 - 126 U/L 47  62    AST 15 - 41 U/L 21  14    ALT 0 - 44 U/L 10  9         RADIOGRAPHIC STUDIES: I have personally reviewed the radiological images as listed and agreed with the findings in the report.  DG Chest Portable 1 View  Result Date: 09/04/2022 CLINICAL DATA:  Weakness EXAM: PORTABLE CHEST 1 VIEW COMPARISON:  CT from 09/02/2022 FINDINGS: Cardiac shadow is within normal limits. Aortic calcifications are noted. Right-sided chest wall port is seen in satisfactory position. Lungs are clear bilaterally. Changes of prior vertebral augmentation are noted in the midthoracic spine. No acute bony abnormality is noted. IMPRESSION: No acute abnormality noted. Electronically Signed   By: Inez Catalina M.D.   On: 09/04/2022 22:59   CT CHEST ABDOMEN PELVIS W CONTRAST  Addendum Date: 09/03/2022   ADDENDUM REPORT: 09/03/2022 10:22 ADDENDUM: Critical Value/emergent results were called by telephone at the time of interpretation on 09/03/2022 at 10:22 am to provider [facility] , who verbally acknowledged these results. Electronically Signed   By: Kerby Moors M.D.   On: 09/03/2022 10:22   Result Date: 09/03/2022 CLINICAL DATA:  Restaging small cell lung cancer EXAM: CT CHEST, ABDOMEN, AND PELVIS WITH CONTRAST TECHNIQUE: Multidetector CT imaging of the chest, abdomen and pelvis was performed following the standard protocol during bolus administration of intravenous contrast. RADIATION DOSE REDUCTION: This exam was performed according to the departmental dose-optimization program which includes automated exposure control, adjustment of the mA and/or kV according to patient size and/or use of iterative reconstruction technique.  body oncCONTRAST:  146mL OMNIPAQUE IOHEXOL 300 MG/ML  SOLN COMPARISON:  06/15/2022 FINDINGS: CT CHEST FINDINGS Cardiovascular: Heart size appears normal. Aortic atherosclerosis. Coronary artery calcifications. No pericardial effusion. Incidental Pulmonary embolus identified within posterior basal segmental branches of the right lower lobe pulmonary  artery, image 69/6, image 75/5 and image 81/5. Mediastinum/Nodes: No enlarged mediastinal, hilar, or axillary lymph nodes. Thyroid gland, trachea, and esophagus demonstrate no significant findings. Lungs/Pleura: No pleural effusion identified. Paraseptal and centrilobular emphysema identified. Bilateral peripheral and lower lung zone predominant subpleural scarring is noted, likely post infectious or inflammatory in etiology. Stable mild pleural thickening overlying the posterior left lower lobe. Calcified granuloma identified within the right upper lobe, image 34/4. Calcified granuloma in the right middle lobe is also noted, image 94/4. No suspicious lung nodules identified at this time. Musculoskeletal: Unchanged treated compression deformity involving the T7 vertebra. Stable inferior endplate deformity involving the T4 vertebra. Chronic displaced fracture involving the proximal body of sternum is unchanged. No acute or suspicious osseous abnormality. CT ABDOMEN PELVIS FINDINGS Hepatobiliary: No focal liver abnormality is seen. Status post cholecystectomy. No biliary dilatation. Pancreas: Unremarkable. No pancreatic ductal dilatation or surrounding inflammatory changes. Spleen: Calcified granuloma identified within the spleen. Spleen otherwise unremarkable. Adrenals/Urinary Tract: Adrenal glands are unremarkable. Kidneys are normal, without renal calculi, focal lesion, or hydronephrosis. Bladder is unremarkable. Stomach/Bowel: Stomach appears within normal limits. The appendix is not clearly visualized. No bowel wall thickening, inflammation, or distension. Sigmoid  diverticulosis without signs of acute diverticulitis. Vascular/Lymphatic: Aortic atherosclerosis. No signs of abdominopelvic adenopathy. Reproductive: Uterus and bilateral adnexa are unremarkable. Other: No free fluid or fluid collections. Previous ventral abdominal wall herniorrhaphy. Musculoskeletal: No acute or significant osseous findings. IMPRESSION: 1. Stable CT of the chest, abdomen and pelvis. No findings to suggest recurrent or metastatic disease. 2. Incidental acute pulmonary embolus identified within segmental branches of the right lower lobe pulmonary artery. 3. Stable compression deformities within the thoracic spine. 4. 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 Aortic Atherosclerosis (ICD10-I70.0) and Emphysema (ICD10-J43.9). Electronically Signed: By: Kerby Moors M.D. On: 09/03/2022 10:03   MR Brain W Wo Contrast  Result Date: 07/23/2022 CLINICAL DATA:  Unsteady gait and memory loss. History of lung cancer EXAM: MRI HEAD WITHOUT AND WITH CONTRAST TECHNIQUE: Multiplanar, multiecho pulse sequences of the brain and surrounding structures were obtained without and with intravenous contrast. CONTRAST:  33mL GADAVIST GADOBUTROL 1 MMOL/ML IV SOLN COMPARISON:  03/13/2022 FINDINGS: Brain: No enhancement or swelling to suggest metastatic disease. No acute or subacute infarction, hemorrhage, hydrocephalus, extra-axial collection or mass lesion. Chronic small vessel ischemia with confluent extensive white matter involvement, question prior whole-brain radiotherapy given small cell histology. 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 Vascular: Major flow voids and vascular enhancements are preserved Skull and upper cervical spine: Normal marrow signal Sinuses/Orbits: Negative IMPRESSION: 1. Negative for metastatic disease. 2. Severe chronic small vessel disease. Electronically Signed   By: Jorje Guild M.D.   On:  07/23/2022 16:20   CT CHEST ABDOMEN PELVIS W CONTRAST  Result Date: 06/15/2022 CLINICAL DATA:  Metastatic lung cancer restaging, brain metastases * Tracking Code: BO * EXAM: CT CHEST, ABDOMEN, AND PELVIS WITH CONTRAST TECHNIQUE: Multidetector CT imaging of the chest, abdomen and pelvis was performed following the standard protocol during bolus administration of intravenous contrast. RADIATION DOSE REDUCTION: This exam was performed according to the departmental dose-optimization program which includes automated exposure control, adjustment of the mA and/or kV according to patient size and/or use of iterative reconstruction technique. CONTRAST:  165mL OMNIPAQUE IOHEXOL 300 MG/ML SOLN additional oral enteric contrast COMPARISON:  PET-CT, 11/21/2021, CT abdomen pelvis, 03/24/2022, CT chest angiogram, 03/13/2022 FINDINGS: CT CHEST FINDINGS Cardiovascular: Right chest port catheter. Aortic atherosclerosis. Normal heart size.  Three-vessel coronary artery calcifications. No pericardial effusion. Mediastinum/Nodes: No enlarged mediastinal, hilar, or axillary lymph nodes. Thyroid gland, trachea, and esophagus demonstrate no significant findings. Lungs/Pleura: Mild centrilobular and paraseptal emphysema. Unchanged, bandlike scarring of the bilateral lower lobes (series 5, image 100). No pleural effusion or pneumothorax. Musculoskeletal: No chest wall mass or suspicious osseous lesions identified. Unchanged inferior endplate T4 and high-grade T7 wedge deformities status post T7 vertebral cement augmentation. Chronically displaced, sclerotic fracture of the superior sternal body (series 7, image 82). CT ABDOMEN PELVIS FINDINGS Hepatobiliary: No focal liver abnormality is seen. Status post cholecystectomy. No biliary dilatation. Pancreas: Unremarkable. No pancreatic ductal dilatation or surrounding inflammatory changes. Spleen: Normal in size without significant abnormality. Adrenals/Urinary Tract: Adrenal glands are  unremarkable. Kidneys are normal, without renal calculi, solid lesion, or hydronephrosis. Bladder is unremarkable. Stomach/Bowel: Stomach is within normal limits. Appendix is not clearly visualized. No evidence of bowel wall thickening, distention, or inflammatory changes. Vascular/Lymphatic: Aortic atherosclerosis. No enlarged abdominal or pelvic lymph nodes. Reproductive: No mass or other abnormality. Other: Ventral hernia mesh repair.  No ascites. Musculoskeletal: No acute osseous findings. IMPRESSION: 1. No CT evidence of recurrent or metastatic disease in the chest, abdomen, or pelvis. 2. Unchanged, bandlike scarring of the bilateral lower lobes. 3. Emphysema. 4. Unchanged inferior endplate T4 and high-grade T7 wedge deformities status post T7 vertebral cement augmentation. Chronically displaced, sclerotic fracture of the superior sternal body. 5. Coronary artery disease. Aortic Atherosclerosis (ICD10-I70.0). Electronically Signed   By: Delanna Ahmadi M.D.   On: 06/15/2022 16:31

## 2022-09-29 NOTE — Assessment & Plan Note (Signed)
Immunotherapy treatment as listed above.

## 2022-09-29 NOTE — Assessment & Plan Note (Signed)
bone metastasis and Osteopenia Continue zometa every 4-6 weeks. .  Continue calcium supplementation.

## 2022-10-20 ENCOUNTER — Inpatient Hospital Stay: Payer: Medicare Other

## 2022-10-20 ENCOUNTER — Telehealth: Payer: Self-pay | Admitting: Oncology

## 2022-10-20 ENCOUNTER — Inpatient Hospital Stay (HOSPITAL_BASED_OUTPATIENT_CLINIC_OR_DEPARTMENT_OTHER): Payer: Medicare Other | Admitting: Oncology

## 2022-10-20 ENCOUNTER — Encounter: Payer: Self-pay | Admitting: Oncology

## 2022-10-20 VITALS — BP 103/79 | HR 80 | Temp 97.8°F | Wt 136.4 lb

## 2022-10-20 DIAGNOSIS — R531 Weakness: Secondary | ICD-10-CM | POA: Diagnosis not present

## 2022-10-20 DIAGNOSIS — C3490 Malignant neoplasm of unspecified part of unspecified bronchus or lung: Secondary | ICD-10-CM | POA: Diagnosis not present

## 2022-10-20 DIAGNOSIS — Z5112 Encounter for antineoplastic immunotherapy: Secondary | ICD-10-CM | POA: Diagnosis not present

## 2022-10-20 DIAGNOSIS — Z79899 Other long term (current) drug therapy: Secondary | ICD-10-CM | POA: Diagnosis not present

## 2022-10-20 DIAGNOSIS — I2699 Other pulmonary embolism without acute cor pulmonale: Secondary | ICD-10-CM | POA: Diagnosis not present

## 2022-10-20 DIAGNOSIS — M8580 Other specified disorders of bone density and structure, unspecified site: Secondary | ICD-10-CM | POA: Diagnosis not present

## 2022-10-20 LAB — COMPREHENSIVE METABOLIC PANEL
ALT: 9 U/L (ref 0–44)
AST: 13 U/L — ABNORMAL LOW (ref 15–41)
Albumin: 3.9 g/dL (ref 3.5–5.0)
Alkaline Phosphatase: 57 U/L (ref 38–126)
Anion gap: 6 (ref 5–15)
BUN: 13 mg/dL (ref 8–23)
CO2: 22 mmol/L (ref 22–32)
Calcium: 8.8 mg/dL — ABNORMAL LOW (ref 8.9–10.3)
Chloride: 107 mmol/L (ref 98–111)
Creatinine, Ser: 0.78 mg/dL (ref 0.44–1.00)
GFR, Estimated: >60 mL/min (ref >60)
Glucose, Bld: 102 mg/dL — ABNORMAL HIGH (ref 70–99)
Potassium: 3.7 mmol/L (ref 3.5–5.1)
Sodium: 135 mmol/L (ref 135–145)
Total Bilirubin: 0.5 mg/dL (ref 0.3–1.2)
Total Protein: 7.6 g/dL (ref 6.5–8.1)

## 2022-10-20 LAB — CBC WITH DIFFERENTIAL/PLATELET
Abs Immature Granulocytes: 0.04 10*3/uL (ref 0.00–0.07)
Basophils Absolute: 0.1 10*3/uL (ref 0.0–0.1)
Basophils Relative: 1 %
Eosinophils Absolute: 0.3 10*3/uL (ref 0.0–0.5)
Eosinophils Relative: 6 %
HCT: 35.6 % — ABNORMAL LOW (ref 36.0–46.0)
Hemoglobin: 11.8 g/dL — ABNORMAL LOW (ref 12.0–15.0)
Immature Granulocytes: 1 %
Lymphocytes Relative: 24 %
Lymphs Abs: 1.3 10*3/uL (ref 0.7–4.0)
MCH: 28.6 pg (ref 26.0–34.0)
MCHC: 33.1 g/dL (ref 30.0–36.0)
MCV: 86.4 fL (ref 80.0–100.0)
Monocytes Absolute: 0.8 10*3/uL (ref 0.1–1.0)
Monocytes Relative: 15 %
Neutro Abs: 3 10*3/uL (ref 1.7–7.7)
Neutrophils Relative %: 53 %
Platelets: 202 10*3/uL (ref 150–400)
RBC: 4.12 MIL/uL (ref 3.87–5.11)
RDW: 15.1 % (ref 11.5–15.5)
WBC: 5.5 10*3/uL (ref 4.0–10.5)
nRBC: 0 % (ref 0.0–0.2)

## 2022-10-20 MED ORDER — HEPARIN SOD (PORK) LOCK FLUSH 100 UNIT/ML IV SOLN
500.0000 [IU] | Freq: Once | INTRAVENOUS | Status: AC
  Administered 2022-10-20: 500 [IU] via INTRAVENOUS
  Filled 2022-10-20: qty 5

## 2022-10-20 NOTE — Assessment & Plan Note (Addendum)
#  Extensive small cell lung cancer, S/p 4 cycles of Carboplatin, Etoposide and Tecentriq.   Previously Tecentriq maintenance. 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  Diarrhea has completely resolved. CT chest abdomen pelvis imaging and MRI brain recently showed no evidence of cancer recurrence. Shared decision was made to stop Tecentriq today.  Continue surveillance.

## 2022-10-20 NOTE — Progress Notes (Signed)
Hematology/Oncology Progress note [phone] [phone]      Patient Care Team: Glean Hess, MD as PCP - General (Internal Medicine) Charolette Forward, MD as Consulting Physician (Cardiology) Telford Nab, RN as Registered Nurse [name], MD as Radiation Oncologist (Radiation Oncology)   ASSESSMENT & PLAN:     Small cell lung cancer ([place]) #Extensive small cell lung cancer, S/p 4 cycles of Carboplatin, Etoposide and Tecentriq.   Previously Tecentriq maintenance. 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  Diarrhea has completely resolved. CT chest abdomen pelvis imaging and MRI brain recently showed no evidence of cancer recurrence. Shared decision was made to stop Tecentriq today.  Continue surveillance.   Pulmonary embolus ([facility]) #Acute pulmonary embolism, unprovoked, recurrent. Continue Eliquis 5 mg twice daily for anticoagulation.    Orders Placed This Encounter  Procedures   CT CHEST ABDOMEN PELVIS W CONTRAST    Standing Status:   Future    Standing Expiration Date:   10/21/2023    Order Specific Question:   Preferred imaging location?    Answer:   [facility]    Order Specific Question:   Is Oral Contrast requested for this exam?    Answer:   Yes, Per Radiology protocol   CBC with Differential/Platelet    Standing Status:   Future    Standing Expiration Date:   10/21/2023   Comprehensive metabolic panel    Standing Status:   Future    Standing Expiration Date:   10/20/2023   Lactate dehydrogenase    Standing Status:   Future    Standing Expiration Date:   10/21/2023   TSH    Standing Status:   Future    Standing Expiration Date:   10/21/2023   Follow-up in   Repeat CT scan in Dec 2023 Follow up after CT  All questions were answered. The patient knows to call the clinic with any problems, questions or concerns.  Earlie Server, MD, PhD [facility] Hematology Oncology 10/20/2022      REASON FOR  VISIT:  Follow-up for small cell lung cancer,   HISTORY OF PRESENTING ILLNESS:  Kristi [name] is a  76 y.o.  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  10/20/2018 CT chest with contrast showed large mediastinal mass involving both hilar, left greater than right, consistent with lung carcinoma, The mass causes narrowing of the left mainstem bronchus with resultant volume loss on the left and a mediastinal shift to the left.  Moderate size left pleural effusion. 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  # Initial MRI brain negative.  # Nov 2019- Jan 2020 s/p 4 cycles of Carbo/Etoposide/Tecentriq #  01/24/2019 interim CT scan done which showed continued positive response to therapy with continued reduction in mediastinal adenopathy.  No residual measurable left lung mass. CT findings of acute emphysematous cystitis. Urology did not feel that patient has pyelonephritis and recommend treatment with antibiotics because patient's immunocompromise. Patient finished treatment.   # 11/02/2018-01/09/2019 Chemotherapy carboplatin + Etoposide + tecentriq # Consolidation chest radiation and whole brain radiation finished in May 2020 # 04/25/2019 resume Tecentriq every 3 weeks. # Patient had a fall from her stairs on 01/19/2020. In the emergency room she had CT chest abdomen pelvis CT, CT head without contrast, CT cervical spine without contrast. No CT evidence for acute intracranial abnormality, degenerative changes of cervical spine..  No CT  evidence of acute thoracic abdomen injury.  Severe compression fracture of T7, subacute.  # kyphoplasty procedure on 03/01/2020.  T7 biopsy negative for cancer.  11/20/2020 Surveillance CT scan  Reduced size and prominence of right lateral lung base subpleural nodular density. No findings of active malignancy.  MRI brain showed stable  post treatment brain. No metastatic disease.   # 10/29/2021 MRI brain w and wo contrast showed two definite new enhancing lesions in cerebellum, with associated restricted diffusion and increased T2 signal, concerning for new foci of metastatic disease. 2. A third lesion in the right cerebellar hemisphere was likely present on the prior exam but continues to show limited contrast enhancement on SPGR; this lesion is associated with diffusion restriction and increased T2 signal, but, on coronal and sagittal T1 sequences, it correlates with an area affected by pulsation artifact. This is also felt to represent a new focus of metastatic disease.  11/20/2021 PET scan - no FDG avid disease in neck chest abdomen or pelvis.  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 No enhancing metastatic disease seen today, chronic small vessel ischemia.   12/19/2021 was seen by neurology Dr.Vaslow. increase ASA to 325mg  daily. Atorvastatin.  40 mg  03/13/2022 - 03/18/2022, patient was hospitalized due to lower extremity weakness CT head did not show any abnormality.  MRI brain confirmed no evidence of new CVA.  Patient was seen by neurologist and weakness was felt to be multifactorial. CT chest angiogram protocol showed bilateral pulmonary emboli, patient was started on heparin drip and transition to Xarelto at discharge. For 03/25/2022-4 /10/2022, patient was readmitted due to positive blood culture which was done during her previous admission.  Blood culture was positive for Streptococcus species in both bottles.  Patient had soft blood pressures initially and was given supportive care and IV antibiotics.  She completed antibiotic course.  At discharge, she was recommended to continue anticoagulation.  Both patient and husband are not aware about diagnosis of pulmonary embolism and not aware about new  prescription of Xarelto. Xarelto appeared to be prescribed and prescription was printed out.  Patient and husband both not aware about the new prescription.  Patient has been off anticoagulation presumably since 03/31/2022.  Occasionally she has shortness of breath.  No chest pain.  She reports feeling 100% better today.  #09/03/2022 recurrent pulmonary embolism due to patient ran out of Eliquis.  Restarted on Eliquis  INTERVAL HISTORY Kristi [name] is a 76 y.o. female who has above history reviewed by me today presents for evaluation prior to immunotherapy for treatment of extensive small cell lung cancer Patient was on Eliquis 5 mg twice daily.  She tolerates well.  She has felt more fatigue, and decrease mobility since resume of Tecentriq.    Review of Systems  Constitutional:  Positive for fatigue. Negative for appetite change, chills and fever.  HENT:   Negative for hearing loss and voice change.   Eyes:  Negative for eye problems.  Respiratory:  Positive for shortness of breath. Negative for chest tightness and cough.   Cardiovascular:  Negative for chest pain.  Gastrointestinal:  Negative for abdominal distention, abdominal pain, blood in stool, diarrhea and nausea.  Endocrine: Negative for hot flashes.  Genitourinary:  Negative for difficulty urinating and frequency.   Musculoskeletal:  Negative for arthralgias and back pain.       Lower extremity weakness  Skin:  Negative for  itching and rash.  Neurological:  Negative for extremity weakness, headaches and light-headedness.  Hematological:  Negative for adenopathy.  Psychiatric/Behavioral:  Negative for confusion and depression. The patient is not nervous/anxious.        Forgetful     MEDICAL HISTORY:  Past Medical History:  Diagnosis Date   Acute MI, inferoposterior wall ([place]) 09/30/2014   1 stent   Claustrophobia    Coronary artery disease    GERD (gastroesophageal reflux disease)    Hypercholesteremia    MI, old     Pneumonia    Small cell lung cancer ([place])    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 ([place]) 10/27/2018    SURGICAL HISTORY: Past Surgical History:  Procedure Laterality Date   APPENDECTOMY     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   BLADDER NECK SUSPENSION     CHOLECYSTECTOMY N/A 08/14/2018   Procedure: LAPAROSCOPIC CHOLECYSTECTOMY;  Surgeon: Jules Husbands, MD;  Location: [facility] ORS;  Service: General;  Laterality: N/A;   CHOLECYSTECTOMY  11/2018   CORONARY ANGIOPLASTY  09/29/2014   CORONARY STENT PLACEMENT     ENDOBRONCHIAL ULTRASOUND N/A 10/21/2018   Procedure: ENDOBRONCHIAL ULTRASOUND;  Surgeon: Laverle Hobby, MD;  Location: [facility] ORS;  Service: Pulmonary;  Laterality: N/A;   HERNIA REPAIR  2012   IR FLUORO GUIDE CV LINE RIGHT  12/19/2018   KYPHOPLASTY N/A 03/01/2020   Procedure: T7 KYPHOPLASTY;  Surgeon: Hessie Knows, MD;  Location: [facility] ORS;  Service: Orthopedics;  Laterality: N/A;   LAPAROTOMY     LEFT HEART CATHETERIZATION WITH CORONARY ANGIOGRAM N/A 09/29/2014   Procedure: LEFT HEART CATHETERIZATION WITH CORONARY ANGIOGRAM;  Surgeon: Clent Demark, MD;  Location: [facility];  Service: Cardiovascular;  Laterality: N/A;   PORTA CATH INSERTION N/A 01/02/2019   Procedure: PORTA CATH INSERTION;  Surgeon: Algernon Huxley, MD;  Location: [facility];  Service: Cardiovascular;  Laterality: N/A;   PORTACATH PLACEMENT Right 10/28/2018   Procedure: INSERTION PORT-A-CATH;  Surgeon: Jules Husbands, MD;  Location: [facility];  Service: General;  Laterality: Right;   TEE WITHOUT CARDIOVERSION Right 03/30/2022   Procedure: TRANSESOPHAGEAL ECHOCARDIOGRAM (TEE);  Surgeon: Dionisio David, MD;  Location: [facility];  Service: Cardiovascular;  Laterality: Right;   XI ROBOTIC ASSISTED VENTRAL HERNIA N/A 02/27/2021   Procedure: XI ROBOTIC ASSISTED VENTRAL HERNIA (incisional) WITH MESH;  Surgeon: Olean Ree, MD;  Location: [facility] ORS;  Service: General;  Laterality: N/A;    SOCIAL HISTORY: Social History    Socioeconomic History   Marital status: Married    Spouse name: [name]    Number of children: 3   Years of education: Not on file   Highest education level: Not on file  Occupational History   Occupation: Retired    Comment: Chief Technology Officer   Tobacco Use   Smoking status: Former    Packs/day: 1.00    Years: 39.00    Total pack years: 39.00    Types: Cigarettes    Start date: 12/21/1978    Quit date: 10/16/2018    Years since quitting: 4.0   Smokeless tobacco: Never  Vaping Use   Vaping Use: Never used  Substance and Sexual Activity   Alcohol use: Yes    Alcohol/week: 0.0 standard drinks of alcohol    Comment: occassional - approx 1 every 2 weeks    Drug use: No   Sexual activity: Yes    Birth control/protection: None  Other Topics Concern   Not on file  Social  History Narrative   Not on file   Social Determinants of Health   Financial Resource Strain: Low Risk  (01/02/2019)   Overall Financial Resource Strain (CARDIA)    Difficulty of Paying Living Expenses: Not very hard  Food Insecurity: Not on file  Transportation Needs: No Transportation Needs (01/02/2019)   PRAPARE - Hydrologist (Medical): No    Lack of Transportation (Non-Medical): No  Physical Activity: Unknown (01/02/2019)   Exercise Vital Sign    Days of Exercise per Week: 0 days    Minutes of Exercise per Session: Not on file  Stress: Stress Concern Present (01/02/2019)   [place]    Feeling of Stress : To some extent  Social Connections: Unknown (01/02/2019)   Social Connection and Isolation Panel [NHANES]    Frequency of Communication with Friends and Family: Three times a week    Frequency of Social Gatherings with Friends and Family: Once a week    Attends Religious Services: 1 to 4 times per year    Active Member of [employer] or Organizations: Not on file    Attends Archivist Meetings: Not on file     Marital Status: Married  Intimate Partner Violence: Not At Risk (01/02/2019)   Humiliation, Afraid, Rape, and Kick questionnaire    Fear of Current or Ex-Partner: No    Emotionally Abused: No    Physically Abused: No    Sexually Abused: No    FAMILY HISTORY: Family History  Problem Relation Age of Onset   Alzheimer's disease Mother    Colon cancer Father    Breast cancer Neg Hx     ALLERGIES:  has No Known Allergies.  MEDICATIONS:  Current Outpatient Medications  Medication Sig Dispense Refill   acetaminophen (TYLENOL) 500 MG tablet Take 2 tablets (1,000 mg total) by mouth every 6 (six) hours as needed for mild pain or headache.     alum & mag hydroxide-simeth (MAALOX/MYLANTA) 200-200-20 MG/5ML suspension Take 30 mLs by mouth every 6 (six) hours as needed for indigestion or heartburn. 355 mL 0   apixaban (ELIQUIS) 5 MG TABS tablet Take 1 tablet (5 mg total) by mouth 2 (two) times daily. 60 tablet 5   atorvastatin (LIPITOR) 40 MG tablet Take 1 tablet (40 mg total) by mouth daily. 30 tablet 3   Calcium 600-200 MG-UNIT tablet Take 1 tablet by mouth 2 (two) times daily.     Cholecalciferol (DIALYVITE VITAMIN D 5000) 125 MCG (5000 UT) capsule Take 5,000 Units by mouth daily.     diphenhydrAMINE (BENADRYL) 25 mg capsule Take 1 capsule (25 mg total) by mouth at bedtime as needed (if no sleep even with trazodone). 30 capsule 0   Magnesium 250 MG TABS Take 250 mg by mouth daily.     midodrine (PROAMATINE) 5 MG tablet Take 1 tablet (5 mg total) by mouth 3 (three) times daily with meals. 90 tablet 1   oxyCODONE (OXY IR/ROXICODONE) 5 MG immediate release tablet Take 1 tablet (5 mg total) by mouth every 12 (twelve) hours as needed for severe pain or moderate pain. 30 tablet 0   pantoprazole (PROTONIX) 40 MG tablet Take 1 tablet (40 mg total) by mouth 2 (two) times daily. (Patient taking differently: Take 40 mg by mouth 2 (two) times daily as needed.) 60 tablet 0   prochlorperazine (COMPAZINE) 10  MG tablet Take 10 mg by mouth every 6 (six) hours as needed for  nausea or vomiting.     sertraline (ZOLOFT) 100 MG tablet Take 1 tablet (100 mg total) by mouth daily. 90 tablet 3   sucralfate (CARAFATE) 1 g tablet Take 1 g by mouth 3 (three) times daily as needed (heartburn).     vitamin B-12 (CYANOCOBALAMIN) 1000 MCG tablet Take 1,000 mcg by mouth daily.     lidocaine (LIDODERM) 5 % Place 1 patch onto the skin daily. Remove & Discard patch within 12 hours or as directed by MD (Patient not taking: Reported on 04/27/2022) 30 patch 0   polyethylene glycol (MIRALAX / GLYCOLAX) 17 g packet Take 17 g by mouth daily as needed for mild constipation. (Patient not taking: Reported on 06/01/2022) 14 each 0   traZODone (DESYREL) 50 MG tablet Take 1 tablet (50 mg total) by mouth at bedtime as needed for sleep. (Patient not taking: Reported on 09/29/2022) 30 tablet 2   No current facility-administered medications for this visit.   Facility-Administered Medications Ordered in Other Visits  Medication Dose Route Frequency Provider Last Rate Last Admin   heparin lock flush 100 unit/mL  500 Units Intravenous Once Earlie Server, MD       heparin lock flush 100 unit/mL  500 Units Intravenous Once Earlie Server, MD       sodium chloride flush (NS) 0.9 % injection 10 mL  10 mL Intravenous PRN Earlie Server, MD   10 mL at 01/09/19 0820   sodium chloride flush (NS) 0.9 % injection 10 mL  10 mL Intravenous PRN Earlie Server, MD   10 mL at 01/10/19 1400     PHYSICAL EXAMINATION: ECOG PERFORMANCE STATUS: 1 - Symptomatic but completely ambulatory Vitals:   10/20/22 1040  BP: 103/79  Pulse: 80  Temp: 97.8 F (36.6 C)  SpO2: 100%   Filed Weights   10/20/22 1040  Weight: 136 lb 6.4 oz (61.9 kg)   Physical Examination Today's Vitals   10/20/22 1040  BP: 103/79  Pulse: 80  Temp: 97.8 F (36.6 C)  TempSrc: Tympanic  SpO2: 100%  Weight: 136 lb 6.4 oz (61.9 kg)  PainSc: 0-No pain   Body mass index is 21.36 kg/m.   Physical  Exam Constitutional:      General: She is not in acute distress.    Appearance: She is not diaphoretic.     Comments: Patient sits in the wheelchair Frail appearance  HENT:     Head: Normocephalic and atraumatic.     Nose: Nose normal.     Mouth/Throat:     Pharynx: No oropharyngeal exudate.  Eyes:     General: No scleral icterus.    Pupils: Pupils are equal, round, and reactive to light.  Cardiovascular:     Rate and Rhythm: Normal rate and regular rhythm.     Heart sounds: No murmur heard. Pulmonary:     Effort: Pulmonary effort is normal. No respiratory distress.     Breath sounds: No wheezing or rales.     Comments: Decreased breath sounds bilaterally.  Abdominal:     General: There is no distension.     Palpations: Abdomen is soft.     Tenderness: There is no abdominal tenderness.  Musculoskeletal:        General: Normal range of motion.     Cervical back: Normal range of motion and neck supple.  Skin:    General: Skin is warm and dry.     Findings: No erythema.  Neurological:     Mental Status: She is  alert and oriented to person, place, and time.     Cranial Nerves: No cranial nerve deficit.     Motor: No abnormal muscle tone.     Coordination: Coordination normal.  Psychiatric:        Mood and Affect: Affect normal.        LABORATORY DATA:  I have reviewed the data as listed    Latest Ref Rng & Units 10/20/2022   10:09 AM 09/29/2022    9:46 AM 09/07/2022    9:52 AM  CBC  WBC 4.0 - 10.5 K/uL 5.5  6.1  5.9   Hemoglobin 12.0 - 15.0 g/dL 11.8  11.6  11.9   Hematocrit 36.0 - 46.0 % 35.6  35.5  36.2   Platelets 150 - 400 K/uL 202  171  199       Latest Ref Rng & Units 10/20/2022   10:09 AM 09/29/2022    9:46 AM 09/07/2022    9:52 AM  CMP  Glucose 70 - 99 mg/dL 102  103  90   BUN 8 - 23 mg/dL 13  12  12    Creatinine 0.44 - 1.00 mg/dL 0.78  0.75  1.01   Sodium 135 - 145 mmol/L 135  137  135   Potassium 3.5 - 5.1 mmol/L 3.7  3.8  3.9   Chloride 98 -  111 mmol/L 107  109  102   CO2 22 - 32 mmol/L 22  22  25    Calcium 8.9 - 10.3 mg/dL 8.8  8.9  9.0   Total Protein 6.5 - 8.1 g/dL 7.6  7.0  7.9   Total Bilirubin 0.3 - 1.2 mg/dL 0.5  0.6  0.5   Alkaline Phos 38 - 126 U/L 57  47  62   AST 15 - 41 U/L 13  21  14    ALT 0 - 44 U/L 9  10  9         RADIOGRAPHIC STUDIES: I have personally reviewed the radiological images as listed and agreed with the findings in the report. US Venous Img Lower Bilateral  Result Date: 09/09/2022 CLINICAL DATA:  76 year old female with leg swelling and pulmonary embolism EXAM: BILATERAL LOWER EXTREMITY VENOUS DOPPLER ULTRASOUND TECHNIQUE: Gray-scale sonography with graded compression, as well as color Doppler and duplex ultrasound were performed to evaluate the lower extremity deep venous systems from the level of the common femoral vein and including the common femoral, femoral, profunda femoral, popliteal and calf veins including the posterior tibial, peroneal and gastrocnemius veins when visible. The superficial great saphenous vein was also interrogated. Spectral Doppler was utilized to evaluate flow at rest and with distal augmentation maneuvers in the common femoral, femoral and popliteal veins. COMPARISON:  None Available. FINDINGS: RIGHT LOWER EXTREMITY Common Femoral Vein: No evidence of thrombus. Normal compressibility, respiratory phasicity and response to augmentation. Saphenofemoral Junction: No evidence of thrombus. Normal compressibility and flow on color Doppler imaging. Profunda Femoral Vein: No evidence of thrombus. Normal compressibility and flow on color Doppler imaging. Femoral Vein: No evidence of thrombus. Normal compressibility, respiratory phasicity and response to augmentation. Popliteal Vein: No evidence of thrombus. Normal compressibility, respiratory phasicity and response to augmentation. Calf Veins: No evidence of thrombus. Normal compressibility and flow on color Doppler imaging. Superficial  Great Saphenous Vein: No evidence of thrombus. Normal compressibility and flow on color Doppler imaging. Other Findings:  None. LEFT LOWER EXTREMITY Common Femoral Vein: No evidence of thrombus. Normal compressibility, respiratory phasicity and response to augmentation. Saphenofemoral Junction:  No evidence of thrombus. Normal compressibility and flow on color Doppler imaging. Profunda Femoral Vein: No evidence of thrombus. Normal compressibility and flow on color Doppler imaging. Femoral Vein: No evidence of thrombus. Normal compressibility, respiratory phasicity and response to augmentation. Popliteal Vein: No evidence of thrombus. Normal compressibility, respiratory phasicity and response to augmentation. Calf Veins: No evidence of thrombus. Normal compressibility and flow on color Doppler imaging. Superficial Great Saphenous Vein: No evidence of thrombus. Normal compressibility and flow on color Doppler imaging. Other Findings:  None. IMPRESSION: Directed duplex of the bilateral lower extremity negative for DVT Signed, Dulcy Fanny. Nadene Rubins, RPVI [facility] Electronically Signed   By: Corrie Mckusick D.O.   On: 09/09/2022 13:34   DG Chest Portable 1 View  Result Date: 09/04/2022 CLINICAL DATA:  Weakness EXAM: PORTABLE CHEST 1 VIEW COMPARISON:  CT from 09/02/2022 FINDINGS: Cardiac shadow is within normal limits. Aortic calcifications are noted. Right-sided chest wall port is seen in satisfactory position. Lungs are clear bilaterally. Changes of prior vertebral augmentation are noted in the midthoracic spine. No acute bony abnormality is noted. IMPRESSION: No acute abnormality noted. Electronically Signed   By: Inez Catalina M.D.   On: 09/04/2022 22:59   CT CHEST ABDOMEN PELVIS W CONTRAST  Addendum Date: 09/03/2022   ADDENDUM REPORT: 09/03/2022 10:22 ADDENDUM: Critical Value/emergent results were called by telephone at the time of interpretation  on 09/03/2022 at 10:22 am to provider [facility] , who verbally acknowledged these results. Electronically Signed   By: Kerby Moors M.D.   On: 09/03/2022 10:22   Result Date: 09/03/2022 CLINICAL DATA:  Restaging small cell lung cancer EXAM: CT CHEST, ABDOMEN, AND PELVIS WITH CONTRAST TECHNIQUE: Multidetector CT imaging of the chest, abdomen and pelvis was performed following the standard protocol during bolus administration of intravenous contrast. RADIATION DOSE REDUCTION: This exam was performed according to the departmental dose-optimization program which includes automated exposure control, adjustment of the mA and/or kV according to patient size and/or use of iterative reconstruction technique. body onc CONTRAST:  17mL OMNIPAQUE IOHEXOL 300 MG/ML  SOLN COMPARISON:  06/15/2022 FINDINGS: CT CHEST FINDINGS Cardiovascular: Heart size appears normal. Aortic atherosclerosis. Coronary artery calcifications. No pericardial effusion. Incidental Pulmonary embolus identified within posterior basal segmental branches of the right lower lobe pulmonary artery, image 69/6, image 75/5 and image 81/5. Mediastinum/Nodes: No enlarged mediastinal, hilar, or axillary lymph nodes. Thyroid gland, trachea, and esophagus demonstrate no significant findings. Lungs/Pleura: No pleural effusion identified. Paraseptal and centrilobular emphysema identified. Bilateral peripheral and lower lung zone predominant subpleural scarring is noted, likely post infectious or inflammatory in etiology. Stable mild pleural thickening overlying the posterior left lower lobe. Calcified granuloma identified within the right upper lobe, image 34/4. Calcified granuloma in the right middle lobe is also noted, image 94/4. No suspicious lung nodules identified at this time. Musculoskeletal: Unchanged treated compression deformity involving the T7 vertebra. Stable inferior endplate deformity involving the T4 vertebra. Chronic displaced fracture involving the  proximal body of sternum is unchanged. No acute or suspicious osseous abnormality. CT ABDOMEN PELVIS FINDINGS Hepatobiliary: No focal liver abnormality is seen. Status post cholecystectomy. No biliary dilatation. Pancreas: Unremarkable. No pancreatic ductal dilatation or surrounding inflammatory changes. Spleen: Calcified granuloma identified within the spleen. Spleen otherwise unremarkable. Adrenals/Urinary Tract: Adrenal glands are unremarkable. Kidneys are normal, without renal calculi, focal lesion, or hydronephrosis. Bladder is unremarkable. Stomach/Bowel: Stomach appears within normal limits. The appendix is not clearly visualized. No bowel wall thickening, inflammation, or  distension. Sigmoid diverticulosis without signs of acute diverticulitis. Vascular/Lymphatic: Aortic atherosclerosis. No signs of abdominopelvic adenopathy. Reproductive: Uterus and bilateral adnexa are unremarkable. Other: No free fluid or fluid collections. Previous ventral abdominal wall herniorrhaphy. Musculoskeletal: No acute or significant osseous findings. IMPRESSION: 1. Stable CT of the chest, abdomen and pelvis. No findings to suggest recurrent or metastatic disease. 2. Incidental acute pulmonary embolus identified within segmental branches of the right lower lobe pulmonary artery. 3. Stable compression deformities within the thoracic spine. 4. 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 Aortic Atherosclerosis (ICD10-I70.0) and Emphysema (ICD10-J43.9). Electronically Signed: By: Kerby Moors M.D. On: 09/03/2022 10:03   MR Brain W Wo Contrast  Result Date: 07/23/2022 CLINICAL DATA:  Unsteady gait and memory loss. History of lung cancer EXAM: MRI HEAD WITHOUT AND WITH CONTRAST TECHNIQUE: Multiplanar, multiecho pulse sequences of the brain and surrounding structures were obtained without and with intravenous contrast. CONTRAST:  75mL GADAVIST GADOBUTROL 1 MMOL/ML IV SOLN COMPARISON:  03/13/2022  FINDINGS: Brain: No enhancement or swelling to suggest metastatic disease. No acute or subacute infarction, hemorrhage, hydrocephalus, extra-axial collection or mass lesion. Chronic small vessel ischemia with confluent extensive white matter involvement, question prior whole-brain radiotherapy given small cell histology. 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 Vascular: Major flow voids and vascular enhancements are preserved Skull and upper cervical spine: Normal marrow signal Sinuses/Orbits: Negative IMPRESSION: 1. Negative for metastatic disease. 2. Severe chronic small vessel disease. Electronically Signed   By: Jorje Guild M.D.   On: 07/23/2022 16:20

## 2022-10-20 NOTE — Assessment & Plan Note (Signed)
#  Acute pulmonary embolism, unprovoked, recurrent. Continue Eliquis 5 mg twice daily for anticoagulation.

## 2022-10-20 NOTE — Telephone Encounter (Signed)
Per [facility] : CT [place] W, Called pt to schedule. No answer and mailbox full. Will try again later.

## 2022-11-10 ENCOUNTER — Ambulatory Visit: Payer: Medicare Other

## 2022-11-10 ENCOUNTER — Other Ambulatory Visit: Payer: Medicare Other

## 2022-11-10 ENCOUNTER — Ambulatory Visit: Payer: Medicare Other | Admitting: Oncology

## 2022-12-04 ENCOUNTER — Ambulatory Visit
Admission: RE | Admit: 2022-12-04 | Discharge: 2022-12-04 | Disposition: A | Discharge status: Home / Self Care | Payer: Medicare Other | Source: Ambulatory Visit | Attending: Oncology | Admitting: Oncology

## 2022-12-04 DIAGNOSIS — J8410 Pulmonary fibrosis, unspecified: Secondary | ICD-10-CM | POA: Diagnosis not present

## 2022-12-04 DIAGNOSIS — C3490 Malignant neoplasm of unspecified part of unspecified bronchus or lung: Secondary | ICD-10-CM | POA: Insufficient documentation

## 2022-12-04 DIAGNOSIS — Z85118 Personal history of other malignant neoplasm of bronchus and lung: Secondary | ICD-10-CM | POA: Diagnosis not present

## 2022-12-04 DIAGNOSIS — K5730 Diverticulosis of large intestine without perforation or abscess without bleeding: Secondary | ICD-10-CM | POA: Diagnosis not present

## 2022-12-04 DIAGNOSIS — D7389 Other diseases of spleen: Secondary | ICD-10-CM | POA: Diagnosis not present

## 2022-12-04 DIAGNOSIS — J432 Centrilobular emphysema: Secondary | ICD-10-CM | POA: Diagnosis not present

## 2022-12-04 DIAGNOSIS — K449 Diaphragmatic hernia without obstruction or gangrene: Secondary | ICD-10-CM | POA: Diagnosis not present

## 2022-12-04 DIAGNOSIS — N8110 Cystocele, unspecified: Secondary | ICD-10-CM | POA: Diagnosis not present

## 2022-12-04 LAB — POCT I-STAT CREATININE: Creatinine, Ser: 1.1 mg/dL — ABNORMAL HIGH (ref 0.44–1.00)

## 2022-12-04 MED ORDER — IOHEXOL 300 MG/ML  SOLN
100.0000 mL | Freq: Once | INTRAMUSCULAR | Status: AC | PRN
  Administered 2022-12-04: 100 mL via INTRAVENOUS

## 2022-12-09 ENCOUNTER — Inpatient Hospital Stay (HOSPITAL_BASED_OUTPATIENT_CLINIC_OR_DEPARTMENT_OTHER): Payer: Medicare Other | Admitting: Oncology

## 2022-12-09 ENCOUNTER — Inpatient Hospital Stay: Payer: Medicare Other | Attending: Oncology

## 2022-12-09 ENCOUNTER — Encounter: Payer: Self-pay | Admitting: Oncology

## 2022-12-09 ENCOUNTER — Inpatient Hospital Stay: Payer: Medicare Other

## 2022-12-09 VITALS — BP 93/76 | HR 80 | Temp 97.5°F | Wt 140.0 lb

## 2022-12-09 DIAGNOSIS — C3490 Malignant neoplasm of unspecified part of unspecified bronchus or lung: Secondary | ICD-10-CM | POA: Diagnosis not present

## 2022-12-09 DIAGNOSIS — E860 Dehydration: Secondary | ICD-10-CM

## 2022-12-09 DIAGNOSIS — Z8589 Personal history of malignant neoplasm of other organs and systems: Secondary | ICD-10-CM

## 2022-12-09 DIAGNOSIS — I2699 Other pulmonary embolism without acute cor pulmonale: Secondary | ICD-10-CM

## 2022-12-09 DIAGNOSIS — M8580 Other specified disorders of bone density and structure, unspecified site: Secondary | ICD-10-CM | POA: Diagnosis not present

## 2022-12-09 DIAGNOSIS — Z79899 Other long term (current) drug therapy: Secondary | ICD-10-CM | POA: Diagnosis not present

## 2022-12-09 DIAGNOSIS — Z95828 Presence of other vascular implants and grafts: Secondary | ICD-10-CM

## 2022-12-09 DIAGNOSIS — R531 Weakness: Secondary | ICD-10-CM

## 2022-12-09 LAB — TSH: TSH: 1.906 u[IU]/mL (ref 0.350–4.500)

## 2022-12-09 LAB — CBC WITH DIFFERENTIAL/PLATELET
Abs Immature Granulocytes: 0.03 10*3/uL (ref 0.00–0.07)
Basophils Absolute: 0.1 10*3/uL (ref 0.0–0.1)
Basophils Relative: 1 %
Eosinophils Absolute: 0.3 10*3/uL (ref 0.0–0.5)
Eosinophils Relative: 5 %
HCT: 35 % — ABNORMAL LOW (ref 36.0–46.0)
Hemoglobin: 11.8 g/dL — ABNORMAL LOW (ref 12.0–15.0)
Immature Granulocytes: 1 %
Lymphocytes Relative: 26 %
Lymphs Abs: 1.6 10*3/uL (ref 0.7–4.0)
MCH: 28.9 pg (ref 26.0–34.0)
MCHC: 33.7 g/dL (ref 30.0–36.0)
MCV: 85.8 fL (ref 80.0–100.0)
Monocytes Absolute: 0.8 10*3/uL (ref 0.1–1.0)
Monocytes Relative: 13 %
Neutro Abs: 3.3 10*3/uL (ref 1.7–7.7)
Neutrophils Relative %: 54 %
Platelets: 208 10*3/uL (ref 150–400)
RBC: 4.08 MIL/uL (ref 3.87–5.11)
RDW: 13.6 % (ref 11.5–15.5)
WBC: 6.1 10*3/uL (ref 4.0–10.5)
nRBC: 0 % (ref 0.0–0.2)

## 2022-12-09 LAB — LACTATE DEHYDROGENASE: LDH: 111 U/L (ref 98–192)

## 2022-12-09 LAB — COMPREHENSIVE METABOLIC PANEL
ALT: 9 U/L (ref 0–44)
AST: 15 U/L (ref 15–41)
Albumin: 3.8 g/dL (ref 3.5–5.0)
Alkaline Phosphatase: 49 U/L (ref 38–126)
Anion gap: 6 (ref 5–15)
BUN: 15 mg/dL (ref 8–23)
CO2: 23 mmol/L (ref 22–32)
Calcium: 8.7 mg/dL — ABNORMAL LOW (ref 8.9–10.3)
Chloride: 106 mmol/L (ref 98–111)
Creatinine, Ser: 0.86 mg/dL (ref 0.44–1.00)
GFR, Estimated: >60 mL/min (ref >60)
Glucose, Bld: 108 mg/dL — ABNORMAL HIGH (ref 70–99)
Potassium: 3.7 mmol/L (ref 3.5–5.1)
Sodium: 135 mmol/L (ref 135–145)
Total Bilirubin: 0.5 mg/dL (ref 0.3–1.2)
Total Protein: 7.1 g/dL (ref 6.5–8.1)

## 2022-12-09 MED ORDER — SODIUM CHLORIDE 0.9% FLUSH
10.0000 mL | Freq: Once | INTRAVENOUS | Status: AC
  Administered 2022-12-09: 10 mL via INTRAVENOUS
  Filled 2022-12-09: qty 10

## 2022-12-09 MED ORDER — SODIUM CHLORIDE 0.9 % IV SOLN
Freq: Once | INTRAVENOUS | Status: AC
  Filled 2022-12-09: qty 250

## 2022-12-09 MED ORDER — ZOLEDRONIC ACID 4 MG/5ML IV CONC
3.3000 mg | Freq: Once | INTRAVENOUS | Status: AC
  Administered 2022-12-09: 3.3 mg via INTRAVENOUS
  Filled 2022-12-09: qty 4.13

## 2022-12-09 MED ORDER — HEPARIN SOD (PORK) LOCK FLUSH 100 UNIT/ML IV SOLN
500.0000 [IU] | Freq: Once | INTRAVENOUS | Status: AC | PRN
  Administered 2022-12-09: 500 [IU]
  Filled 2022-12-09: qty 5

## 2022-12-09 MED ORDER — APIXABAN 2.5 MG PO TABS: 2.5000 mg | ORAL_TABLET | Freq: Two times a day (BID) | ORAL | 5 refills | Status: DC

## 2022-12-09 NOTE — Assessment & Plan Note (Signed)
#  Extensive small cell lung cancer, S/p 4 cycles of Carboplatin, Etoposide and Tecentriq.   Previously Tecentriq maintenance. 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  Diarrhea has completely resolved. CT chest abdomen pelvis imaging and MRI brain recently showed no evidence of cancer recurrence. Shared decision was made to stop Tecentriq today.  Continue surveillance.

## 2022-12-09 NOTE — Patient Instructions (Signed)

## 2022-12-10 ENCOUNTER — Encounter: Payer: Self-pay | Admitting: Oncology

## 2022-12-10 DIAGNOSIS — Z95828 Presence of other vascular implants and grafts: Secondary | ICD-10-CM | POA: Insufficient documentation

## 2022-12-10 NOTE — Assessment & Plan Note (Signed)
bone metastasis and Osteopenia Continue zometa, increase interval to Q3-4 months.  Continue calcium supplementation.

## 2022-12-10 NOTE — Assessment & Plan Note (Signed)
#  Acute pulmonary embolism, unprovoked, recurrent. Completed 3 months of  Eliquis 5 mg twice daily  CT showed resolution. Recommend decrease to Eliquis 2.5mg  BID for prophylaxis. Rx sent.

## 2022-12-10 NOTE — Assessment & Plan Note (Signed)
Continue port flush every 6-8 weeks.

## 2022-12-10 NOTE — Progress Notes (Signed)
Hematology/Oncology Progress note [phone] [phone]      Patient Care Team: Glean Hess, MD as PCP - General (Internal Medicine) Charolette Forward, MD as Consulting Physician (Cardiology) Telford Nab, RN as Registered Nurse [name], MD as Radiation Oncologist (Radiation Oncology)   ASSESSMENT & PLAN:     Small cell lung cancer ([place]) #Extensive small cell lung cancer, S/p 4 cycles of Carboplatin, Etoposide and Tecentriq.   Previously Tecentriq maintenance, discontinued Tecentriq since Oct 2023. Clinically doing very well.  CT chest abdomen pelvis imaging and MRI brain recently showed no evidence of cancer recurrence. Continue surveillance.   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  Continue calcium supplementation.    Pulmonary embolus (HCC) #Acute pulmonary embolism, unprovoked, recurrent. Completed 3 months of  Eliquis 5 mg twice daily  CT showed resolution. Recommend decrease to Eliquis 2.5mg  BID for prophylaxis. Rx sent.   Port-A-Cath in place Continue port flush every 6-8 weeks.     Orders Placed This Encounter  Procedures   MR Brain W Wo Contrast    Standing Status:   Future    Standing Expiration Date:   12/09/2023    Order Specific Question:   If indicated for the ordered procedure, I authorize the administration of contrast media per Radiology protocol    Answer:   Yes    Order Specific Question:   What is the patient's sedation requirement?    Answer:   No Sedation    Order Specific Question:   Does the patient have a pacemaker or implanted devices?    Answer:   No    Order Specific Question:   Use SRS Protocol?    Answer:   No    Order Specific Question:   Preferred imaging location?    Answer:   [facility] (table limit - 550lbs)   CT CHEST ABDOMEN PELVIS W CONTRAST    To be done on 4/25 after labs at cancer center    Standing  Status:   Future    Standing Expiration Date:   12/10/2023    Order Specific Question:   If indicated for the ordered procedure, I authorize the administration of contrast media per Radiology protocol    Answer:   Yes    Order Specific Question:   Does the patient have a contrast media/X-ray dye allergy?    Answer:   No    Order Specific Question:   Preferred imaging location?    Answer:   [facility]    Order Specific Question:   Is Oral Contrast requested for this exam?    Answer:   Yes, Per Radiology protocol   CBC with Differential/Platelet    Standing Status:   Future    Standing Expiration Date:   12/10/2023   Comprehensive metabolic panel    Standing Status:   Future    Standing Expiration Date:   12/09/2023   Lactate dehydrogenase    Standing Status:   Future    Standing Expiration Date:   12/10/2023   Follow-up in  3 -4 months.   All questions were answered. The patient knows to call the clinic with any problems, questions or concerns.  Earlie Server, MD, PhD [facility] Hematology Oncology 12/09/2022      REASON FOR VISIT:  Follow-up for small cell lung cancer,   HISTORY OF PRESENTING ILLNESS:  Kristi [name] is a  76 y.o.  female with PMH  listed below who was referred to me for evaluation of small cell lung cancer.  10/20/2018 CT chest with contrast showed large mediastinal mass involving both hilar, left greater than right, consistent with lung carcinoma, The mass causes narrowing of the left mainstem bronchus with resultant volume loss on the left and a mediastinal shift to the left.  Moderate size left pleural effusion. 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  # Initial MRI brain negative.  # Nov 2019- Jan 2020 s/p 4 cycles of Carbo/Etoposide/Tecentriq #  01/24/2019 interim CT scan done which showed continued positive response to therapy with continued reduction in  mediastinal adenopathy.  No residual measurable left lung mass. CT findings of acute emphysematous cystitis. Urology did not feel that patient has pyelonephritis and recommend treatment with antibiotics because patient's immunocompromise. Patient finished treatment.   # 11/02/2018-01/09/2019 Chemotherapy carboplatin + Etoposide + tecentriq # Consolidation chest radiation and whole brain radiation finished in May 2020 # 04/25/2019 resume Tecentriq every 3 weeks. # Patient had a fall from her stairs on 01/19/2020. In the emergency room she had CT chest abdomen pelvis CT, CT head without contrast, CT cervical spine without contrast. No CT evidence for acute intracranial abnormality, degenerative changes of cervical spine..  No CT evidence of acute thoracic abdomen injury.  Severe compression fracture of T7, subacute.  # kyphoplasty procedure on 03/01/2020.  T7 biopsy negative for cancer.  11/20/2020 Surveillance CT scan  Reduced size and prominence of right lateral lung base subpleural nodular density. No findings of active malignancy.  MRI brain showed stable post treatment brain. No metastatic disease.   # 10/29/2021 MRI brain w and wo contrast showed two definite new enhancing lesions in cerebellum, with associated restricted diffusion and increased T2 signal, concerning for new foci of metastatic disease. 2. A third lesion in the right cerebellar hemisphere was likely present on the prior exam but continues to show limited contrast enhancement on SPGR; this lesion is associated with diffusion restriction and increased T2 signal, but, on coronal and sagittal T1 sequences, it correlates with an area affected by pulsation artifact. This is also felt to represent a new focus of metastatic disease.  11/20/2021 PET scan - no FDG avid disease in neck chest abdomen or pelvis.  Patient was sent to Dr.Chrystal and 11/21/2021 MRI brain was repeated and showed resolved sites of cerebellar enhancement best  attributed to maturing infarcts. No enhancing metastatic disease seen today, chronic small vessel ischemia.   12/19/2021 was seen by neurology Dr.Vaslow. increase ASA to 325mg  daily. Atorvastatin.  40 mg  03/13/2022 - 03/18/2022, patient was hospitalized due to lower extremity weakness CT head did not show any abnormality.  MRI brain confirmed no evidence of new CVA.  Patient was seen by neurologist and weakness was felt to be multifactorial. CT chest angiogram protocol showed bilateral pulmonary emboli, patient was started on heparin drip and transition to Xarelto at discharge. For 03/25/2022-4 /10/2022, patient was readmitted due to positive blood culture which was done during her previous admission.  Blood culture was positive for Streptococcus species in both bottles.  Patient had soft blood pressures initially and was given supportive care and IV antibiotics.  She completed antibiotic course.  At discharge, she was recommended to continue anticoagulation.  Both patient and husband are not aware about diagnosis of pulmonary embolism and not aware about new prescription of Xarelto. Xarelto appeared to be prescribed and prescription was printed out.  Patient and  husband both not aware about the new prescription.  Patient has been off anticoagulation presumably since 03/31/2022.  Occasionally she has shortness of breath.  No chest pain.  She reports feeling 100% better today.  #09/03/2022 recurrent pulmonary embolism due to patient ran out of Eliquis.  Restarted on Eliquis  INTERVAL HISTORY Kristi [name] is a 76 y.o. female who has above history reviewed by me today presents for evaluation prior to immunotherapy for treatment of extensive small cell lung cancer Patient was on Eliquis 5 mg twice daily.  She tolerates well.  No bleeding.  + forgetful She has gain weight. Appetite is fair.    Review of Systems  Constitutional:  Positive for fatigue. Negative for appetite change, chills and fever.   HENT:   Negative for hearing loss and voice change.   Eyes:  Negative for eye problems.  Respiratory:  Positive for shortness of breath. Negative for chest tightness and cough.   Cardiovascular:  Negative for chest pain.  Gastrointestinal:  Negative for abdominal distention, abdominal pain, blood in stool, diarrhea and nausea.  Endocrine: Negative for hot flashes.  Genitourinary:  Negative for difficulty urinating and frequency.   Musculoskeletal:  Negative for arthralgias and back pain.       Lower extremity weakness  Skin:  Negative for itching and rash.  Neurological:  Negative for extremity weakness, headaches and light-headedness.  Hematological:  Negative for adenopathy.  Psychiatric/Behavioral:  Negative for confusion and depression. The patient is not nervous/anxious.        Forgetful     MEDICAL HISTORY:  Past Medical History:  Diagnosis Date   Acute MI, inferoposterior wall ([place]) 09/30/2014   1 stent   Claustrophobia    Coronary artery disease    GERD (gastroesophageal reflux disease)    Hypercholesteremia    MI, old    Pneumonia    Small cell lung cancer ([place])    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 ([place]) 10/27/2018    SURGICAL HISTORY: Past Surgical History:  Procedure Laterality Date   APPENDECTOMY     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   BLADDER NECK SUSPENSION     CHOLECYSTECTOMY N/A 08/14/2018   Procedure: LAPAROSCOPIC CHOLECYSTECTOMY;  Surgeon: Jules Husbands, MD;  Location: [facility] ORS;  Service: General;  Laterality: N/A;   CHOLECYSTECTOMY  11/2018   CORONARY ANGIOPLASTY  09/29/2014   CORONARY STENT PLACEMENT     ENDOBRONCHIAL ULTRASOUND N/A 10/21/2018   Procedure: ENDOBRONCHIAL ULTRASOUND;  Surgeon: Laverle Hobby, MD;  Location: [facility] ORS;  Service: Pulmonary;  Laterality: N/A;   HERNIA REPAIR  2012   IR FLUORO GUIDE CV LINE RIGHT  12/19/2018   KYPHOPLASTY N/A 03/01/2020   Procedure: T7 KYPHOPLASTY;  Surgeon: Hessie Knows, MD;  Location: [facility] ORS;  Service:  Orthopedics;  Laterality: N/A;   LAPAROTOMY     LEFT HEART CATHETERIZATION WITH CORONARY ANGIOGRAM N/A 09/29/2014   Procedure: LEFT HEART CATHETERIZATION WITH CORONARY ANGIOGRAM;  Surgeon: Clent Demark, MD;  Location: [place] [facility];  Service: Cardiovascular;  Laterality: N/A;   PORTA CATH INSERTION N/A 01/02/2019   Procedure: PORTA CATH INSERTION;  Surgeon: Algernon Huxley, MD;  Location: [place] [facility];  Service: Cardiovascular;  Laterality: N/A;   PORTACATH PLACEMENT Right 10/28/2018   Procedure: INSERTION PORT-A-CATH;  Surgeon: Jules Husbands, MD;  Location: [facility] ORS;  Service: General;  Laterality: Right;   TEE WITHOUT CARDIOVERSION Right 03/30/2022   Procedure: TRANSESOPHAGEAL ECHOCARDIOGRAM (TEE);  Surgeon: Dionisio David, MD;  Location:  [facility] ORS;  Service: Cardiovascular;  Laterality: Right;   XI ROBOTIC ASSISTED VENTRAL HERNIA N/A 02/27/2021   Procedure: XI ROBOTIC ASSISTED VENTRAL HERNIA (incisional) WITH MESH;  Surgeon: Olean Ree, MD;  Location: [facility] ORS;  Service: General;  Laterality: N/A;    SOCIAL HISTORY: Social History   Socioeconomic History   Marital status: Married    Spouse name: [name]    Number of children: 3   Years of education: Not on file   Highest education level: Not on file  Occupational History   Occupation: Retired    Comment: Chief Technology Officer   Tobacco Use   Smoking status: Former    Packs/day: 1.00    Years: 39.00    Total pack years: 39.00    Types: Cigarettes    Start date: 12/21/1978    Quit date: 10/16/2018    Years since quitting: 4.1   Smokeless tobacco: Never  Vaping Use   Vaping Use: Never used  Substance and Sexual Activity   Alcohol use: Yes    Alcohol/week: 0.0 standard drinks of alcohol    Comment: occassional - approx 1 every 2 weeks    Drug use: No   Sexual activity: Yes    Birth control/protection: None  Other Topics Concern   Not on file  Social History Narrative   Not on file   Social Determinants of Health    Financial Resource Strain: Low Risk  (01/02/2019)   Overall Financial Resource Strain (CARDIA)    Difficulty of Paying Living Expenses: Not very hard  Food Insecurity: Not on file  Transportation Needs: No Transportation Needs (01/02/2019)   PRAPARE - Hydrologist (Medical): No    Lack of Transportation (Non-Medical): No  Physical Activity: Unknown (01/02/2019)   Exercise Vital Sign    Days of Exercise per Week: 0 days    Minutes of Exercise per Session: Not on file  Stress: Stress Concern Present (01/02/2019)   [place]    Feeling of Stress : To some extent  Social Connections: Unknown (01/02/2019)   Social Connection and Isolation Panel [NHANES]    Frequency of Communication with Friends and Family: Three times a week    Frequency of Social Gatherings with Friends and Family: Once a week    Attends Religious Services: 1 to 4 times per year    Active Member of [employer] or Organizations: Not on file    Attends Archivist Meetings: Not on file    Marital Status: Married  Intimate Partner Violence: Not At Risk (01/02/2019)   Humiliation, Afraid, Rape, and Kick questionnaire    Fear of Current or Ex-Partner: No    Emotionally Abused: No    Physically Abused: No    Sexually Abused: No    FAMILY HISTORY: Family History  Problem Relation Age of Onset   Alzheimer's disease Mother    Colon cancer Father    Breast cancer Neg Hx     ALLERGIES:  has No Known Allergies.  MEDICATIONS:  Current Outpatient Medications  Medication Sig Dispense Refill   acetaminophen (TYLENOL) 500 MG tablet Take 2 tablets (1,000 mg total) by mouth every 6 (six) hours as needed for mild pain or headache.     alum & mag hydroxide-simeth (MAALOX/MYLANTA) 200-200-20 MG/5ML suspension Take 30 mLs by mouth every 6 (six) hours as needed for indigestion or heartburn. 355 mL 0   apixaban (ELIQUIS) 2.5 MG TABS  tablet Take 1  tablet (2.5 mg total) by mouth 2 (two) times daily. 60 tablet 5   atorvastatin (LIPITOR) 40 MG tablet Take 1 tablet (40 mg total) by mouth daily. 30 tablet 3   Calcium 600-200 MG-UNIT tablet Take 1 tablet by mouth 2 (two) times daily.     Magnesium 250 MG TABS Take 250 mg by mouth daily.     midodrine (PROAMATINE) 5 MG tablet Take 1 tablet (5 mg total) by mouth 3 (three) times daily with meals. 90 tablet 1   oxyCODONE (OXY IR/ROXICODONE) 5 MG immediate release tablet Take 1 tablet (5 mg total) by mouth every 12 (twelve) hours as needed for severe pain or moderate pain. 30 tablet 0   pantoprazole (PROTONIX) 40 MG tablet Take 1 tablet (40 mg total) by mouth 2 (two) times daily. (Patient taking differently: Take 40 mg by mouth 2 (two) times daily as needed.) 60 tablet 0   polyethylene glycol (MIRALAX / GLYCOLAX) 17 g packet Take 17 g by mouth daily as needed for mild constipation. 14 each 0   prochlorperazine (COMPAZINE) 10 MG tablet Take 10 mg by mouth every 6 (six) hours as needed for nausea or vomiting.     sertraline (ZOLOFT) 100 MG tablet Take 1 tablet (100 mg total) by mouth daily. 90 tablet 3   vitamin B-12 (CYANOCOBALAMIN) 1000 MCG tablet Take 1,000 mcg by mouth daily.     Cholecalciferol (DIALYVITE VITAMIN D 5000) 125 MCG (5000 UT) capsule Take 5,000 Units by mouth daily. (Patient not taking: Reported on 12/09/2022)     diphenhydrAMINE (BENADRYL) 25 mg capsule Take 1 capsule (25 mg total) by mouth at bedtime as needed (if no sleep even with trazodone). (Patient not taking: Reported on 12/09/2022) 30 capsule 0   lidocaine (LIDODERM) 5 % Place 1 patch onto the skin daily. Remove & Discard patch within 12 hours or as directed by MD (Patient not taking: Reported on 04/27/2022) 30 patch 0   sucralfate (CARAFATE) 1 g tablet Take 1 g by mouth 3 (three) times daily as needed (heartburn). (Patient not taking: Reported on 12/09/2022)     traZODone (DESYREL) 50 MG tablet Take 1 tablet (50 mg  total) by mouth at bedtime as needed for sleep. (Patient not taking: Reported on 12/09/2022) 30 tablet 2   No current facility-administered medications for this visit.   Facility-Administered Medications Ordered in Other Visits  Medication Dose Route Frequency Provider Last Rate Last Admin   heparin lock flush 100 unit/mL  500 Units Intravenous Once Earlie Server, MD       heparin lock flush 100 unit/mL  500 Units Intravenous Once Earlie Server, MD       sodium chloride flush (NS) 0.9 % injection 10 mL  10 mL Intravenous PRN Earlie Server, MD   10 mL at 01/09/19 0820   sodium chloride flush (NS) 0.9 % injection 10 mL  10 mL Intravenous PRN Earlie Server, MD   10 mL at 01/10/19 1400     PHYSICAL EXAMINATION: ECOG PERFORMANCE STATUS: 1 - Symptomatic but completely ambulatory Vitals:   12/09/22 0930  BP: 93/76  Pulse: 80  Temp: (!) 97.5 F (36.4 C)  SpO2: 99%   Filed Weights   12/09/22 0930  Weight: 140 lb (63.5 kg)   Physical Examination Today's Vitals   12/09/22 0930  BP: 93/76  Pulse: 80  Temp: (!) 97.5 F (36.4 C)  TempSrc: Tympanic  SpO2: 99%  Weight: 140 lb (63.5 kg)  PainSc: 0-No pain   Body  mass index is 21.93 kg/m.   Physical Exam Constitutional:      General: She is not in acute distress.    Appearance: She is not diaphoretic.     Comments: Patient sits in the wheelchair Frail appearance  HENT:     Head: Normocephalic and atraumatic.     Nose: Nose normal.     Mouth/Throat:     Pharynx: No oropharyngeal exudate.  Eyes:     General: No scleral icterus.    Pupils: Pupils are equal, round, and reactive to light.  Cardiovascular:     Rate and Rhythm: Normal rate and regular rhythm.     Heart sounds: No murmur heard. Pulmonary:     Effort: Pulmonary effort is normal. No respiratory distress.     Breath sounds: No wheezing or rales.     Comments: Decreased breath sounds bilaterally.  Abdominal:     General: There is no distension.     Palpations: Abdomen is soft.      Tenderness: There is no abdominal tenderness.  Musculoskeletal:        General: Normal range of motion.     Cervical back: Normal range of motion and neck supple.  Skin:    General: Skin is warm and dry.     Findings: No erythema.  Neurological:     Mental Status: She is alert and oriented to person, place, and time.     Cranial Nerves: No cranial nerve deficit.     Motor: No abnormal muscle tone.     Coordination: Coordination normal.  Psychiatric:        Mood and Affect: Affect normal.        LABORATORY DATA:  I have reviewed the data as listed    Latest Ref Rng & Units 12/09/2022    9:12 AM 10/20/2022   10:09 AM 09/29/2022    9:46 AM  CBC  WBC 4.0 - 10.5 K/uL 6.1  5.5  6.1   Hemoglobin 12.0 - 15.0 g/dL 11.8  11.8  11.6   Hematocrit 36.0 - 46.0 % 35.0  35.6  35.5   Platelets 150 - 400 K/uL 208  202  171       Latest Ref Rng & Units 12/09/2022    9:12 AM 12/04/2022    9:21 AM 10/20/2022   10:09 AM  CMP  Glucose 70 - 99 mg/dL 108   102   BUN 8 - 23 mg/dL 15   13   Creatinine 0.44 - 1.00 mg/dL 0.86  1.10  0.78   Sodium 135 - 145 mmol/L 135   135   Potassium 3.5 - 5.1 mmol/L 3.7   3.7   Chloride 98 - 111 mmol/L 106   107   CO2 22 - 32 mmol/L 23   22   Calcium 8.9 - 10.3 mg/dL 8.7   8.8   Total Protein 6.5 - 8.1 g/dL 7.1   7.6   Total Bilirubin 0.3 - 1.2 mg/dL 0.5   0.5   Alkaline Phos 38 - 126 U/L 49   57   AST 15 - 41 U/L 15   13   ALT 0 - 44 U/L 9   9        RADIOGRAPHIC STUDIES: I have personally reviewed the radiological images as listed and agreed with the findings in the report. US Venous Img Lower Bilateral  Result Date: 09/09/2022 CLINICAL DATA:  76 year old female with leg swelling and pulmonary embolism EXAM: BILATERAL LOWER EXTREMITY VENOUS  DOPPLER ULTRASOUND TECHNIQUE: Gray-scale sonography with graded compression, as well as color Doppler and duplex ultrasound were performed to evaluate the lower extremity deep venous systems from the level of the  common femoral vein and including the common femoral, femoral, profunda femoral, popliteal and calf veins including the posterior tibial, peroneal and gastrocnemius veins when visible. The superficial great saphenous vein was also interrogated. Spectral Doppler was utilized to evaluate flow at rest and with distal augmentation maneuvers in the common femoral, femoral and popliteal veins. COMPARISON:  None Available. FINDINGS: RIGHT LOWER EXTREMITY Common Femoral Vein: No evidence of thrombus. Normal compressibility, respiratory phasicity and response to augmentation. Saphenofemoral Junction: No evidence of thrombus. Normal compressibility and flow on color Doppler imaging. Profunda Femoral Vein: No evidence of thrombus. Normal compressibility and flow on color Doppler imaging. Femoral Vein: No evidence of thrombus. Normal compressibility, respiratory phasicity and response to augmentation. Popliteal Vein: No evidence of thrombus. Normal compressibility, respiratory phasicity and response to augmentation. Calf Veins: No evidence of thrombus. Normal compressibility and flow on color Doppler imaging. Superficial Great Saphenous Vein: No evidence of thrombus. Normal compressibility and flow on color Doppler imaging. Other Findings:  None. LEFT LOWER EXTREMITY Common Femoral Vein: No evidence of thrombus. Normal compressibility, respiratory phasicity and response to augmentation. Saphenofemoral Junction: No evidence of thrombus. Normal compressibility and flow on color Doppler imaging. Profunda Femoral Vein: No evidence of thrombus. Normal compressibility and flow on color Doppler imaging. Femoral Vein: No evidence of thrombus. Normal compressibility, respiratory phasicity and response to augmentation. Popliteal Vein: No evidence of thrombus. Normal compressibility, respiratory phasicity and response to augmentation. Calf Veins: No evidence of thrombus. Normal compressibility and flow on color Doppler imaging.  Superficial Great Saphenous Vein: No evidence of thrombus. Normal compressibility and flow on color Doppler imaging. Other Findings:  None. IMPRESSION: Directed duplex of the bilateral lower extremity negative for DVT Signed, Dulcy Fanny. Nadene Rubins, RPVI [facility] Electronically Signed   By: Corrie Mckusick D.O.   On: 09/09/2022 13:34   DG Chest Portable 1 View  Result Date: 09/04/2022 CLINICAL DATA:  Weakness EXAM: PORTABLE CHEST 1 VIEW COMPARISON:  CT from 09/02/2022 FINDINGS: Cardiac shadow is within normal limits. Aortic calcifications are noted. Right-sided chest wall port is seen in satisfactory position. Lungs are clear bilaterally. Changes of prior vertebral augmentation are noted in the midthoracic spine. No acute bony abnormality is noted. IMPRESSION: No acute abnormality noted. Electronically Signed   By: Inez Catalina M.D.   On: 09/04/2022 22:59   CT CHEST ABDOMEN PELVIS W CONTRAST  Addendum Date: 09/03/2022   ADDENDUM REPORT: 09/03/2022 10:22 ADDENDUM: Critical Value/emergent results were called by telephone at the time of interpretation on 09/03/2022 at 10:22 am to provider [facility] , who verbally acknowledged these results. Electronically Signed   By: Kerby Moors M.D.   On: 09/03/2022 10:22   Result Date: 09/03/2022 CLINICAL DATA:  Restaging small cell lung cancer EXAM: CT CHEST, ABDOMEN, AND PELVIS WITH CONTRAST TECHNIQUE: Multidetector CT imaging of the chest, abdomen and pelvis was performed following the standard protocol during bolus administration of intravenous contrast. RADIATION DOSE REDUCTION: This exam was performed according to the departmental dose-optimization program which includes automated exposure control, adjustment of the mA and/or kV according to patient size and/or use of iterative reconstruction technique. body onc CONTRAST:  123mL OMNIPAQUE IOHEXOL 300 MG/ML  SOLN COMPARISON:  06/15/2022 FINDINGS: CT CHEST  FINDINGS Cardiovascular: Heart size appears normal. Aortic atherosclerosis. Coronary artery  calcifications. No pericardial effusion. Incidental Pulmonary embolus identified within posterior basal segmental branches of the right lower lobe pulmonary artery, image 69/6, image 75/5 and image 81/5. Mediastinum/Nodes: No enlarged mediastinal, hilar, or axillary lymph nodes. Thyroid gland, trachea, and esophagus demonstrate no significant findings. Lungs/Pleura: No pleural effusion identified. Paraseptal and centrilobular emphysema identified. Bilateral peripheral and lower lung zone predominant subpleural scarring is noted, likely post infectious or inflammatory in etiology. Stable mild pleural thickening overlying the posterior left lower lobe. Calcified granuloma identified within the right upper lobe, image 34/4. Calcified granuloma in the right middle lobe is also noted, image 94/4. No suspicious lung nodules identified at this time. Musculoskeletal: Unchanged treated compression deformity involving the T7 vertebra. Stable inferior endplate deformity involving the T4 vertebra. Chronic displaced fracture involving the proximal body of sternum is unchanged. No acute or suspicious osseous abnormality. CT ABDOMEN PELVIS FINDINGS Hepatobiliary: No focal liver abnormality is seen. Status post cholecystectomy. No biliary dilatation. Pancreas: Unremarkable. No pancreatic ductal dilatation or surrounding inflammatory changes. Spleen: Calcified granuloma identified within the spleen. Spleen otherwise unremarkable. Adrenals/Urinary Tract: Adrenal glands are unremarkable. Kidneys are normal, without renal calculi, focal lesion, or hydronephrosis. Bladder is unremarkable. Stomach/Bowel: Stomach appears within normal limits. The appendix is not clearly visualized. No bowel wall thickening, inflammation, or distension. Sigmoid diverticulosis without signs of acute diverticulitis. Vascular/Lymphatic: Aortic atherosclerosis. No signs  of abdominopelvic adenopathy. Reproductive: Uterus and bilateral adnexa are unremarkable. Other: No free fluid or fluid collections. Previous ventral abdominal wall herniorrhaphy. Musculoskeletal: No acute or significant osseous findings. IMPRESSION: 1. Stable CT of the chest, abdomen and pelvis. No findings to suggest recurrent or metastatic disease. 2. Incidental acute pulmonary embolus identified within segmental branches of the right lower lobe pulmonary artery. 3. Stable compression deformities within the thoracic spine. 4. 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 Aortic Atherosclerosis (ICD10-I70.0) and Emphysema (ICD10-J43.9). Electronically Signed: By: Kerby Moors M.D. On: 09/03/2022 10:03   MR Brain W Wo Contrast  Result Date: 07/23/2022 CLINICAL DATA:  Unsteady gait and memory loss. History of lung cancer EXAM: MRI HEAD WITHOUT AND WITH CONTRAST TECHNIQUE: Multiplanar, multiecho pulse sequences of the brain and surrounding structures were obtained without and with intravenous contrast. CONTRAST:  75mL GADAVIST GADOBUTROL 1 MMOL/ML IV SOLN COMPARISON:  03/13/2022 FINDINGS: Brain: No enhancement or swelling to suggest metastatic disease. No acute or subacute infarction, hemorrhage, hydrocephalus, extra-axial collection or mass lesion. Chronic small vessel ischemia with confluent extensive white matter involvement, question prior whole-brain radiotherapy given small cell histology. 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 Vascular: Major flow voids and vascular enhancements are preserved Skull and upper cervical spine: Normal marrow signal Sinuses/Orbits: Negative IMPRESSION: 1. Negative for metastatic disease. 2. Severe chronic small vessel disease. Electronically Signed   By: Jorje Guild M.D.   On: 07/23/2022 16:20

## 2023-02-03 ENCOUNTER — Inpatient Hospital Stay: Payer: Medicare Other | Attending: Oncology

## 2023-02-03 DIAGNOSIS — Z452 Encounter for adjustment and management of vascular access device: Secondary | ICD-10-CM | POA: Insufficient documentation

## 2023-02-03 DIAGNOSIS — Z87891 Personal history of nicotine dependence: Secondary | ICD-10-CM | POA: Insufficient documentation

## 2023-02-03 DIAGNOSIS — Z95828 Presence of other vascular implants and grafts: Secondary | ICD-10-CM

## 2023-02-03 DIAGNOSIS — C3490 Malignant neoplasm of unspecified part of unspecified bronchus or lung: Secondary | ICD-10-CM | POA: Diagnosis not present

## 2023-02-03 MED ORDER — HEPARIN SOD (PORK) LOCK FLUSH 100 UNIT/ML IV SOLN
500.0000 [IU] | Freq: Once | INTRAVENOUS | Status: AC
  Administered 2023-02-03: 500 [IU] via INTRAVENOUS
  Filled 2023-02-03: qty 5

## 2023-03-04 ENCOUNTER — Other Ambulatory Visit: Payer: Self-pay | Admitting: *Deleted

## 2023-03-04 NOTE — Telephone Encounter (Signed)
Patient in need of refills for pian medicine that she takes infrquently for pain and heartburn, Oxycodone and Sucrafate.

## 2023-03-05 ENCOUNTER — Other Ambulatory Visit: Payer: Self-pay

## 2023-03-05 ENCOUNTER — Encounter: Payer: Self-pay | Admitting: Oncology

## 2023-03-05 MED ORDER — OXYCODONE HCL 5 MG PO TABS: 5.0000 mg | ORAL_TABLET | Freq: Two times a day (BID) | ORAL | 0 refills | Status: DC | PRN

## 2023-03-05 MED ORDER — SUCRALFATE 1 G PO TABS: 1.0000 g | ORAL_TABLET | Freq: Three times a day (TID) | ORAL | 1 refills | Status: AC | PRN

## 2023-03-08 ENCOUNTER — Encounter: Payer: Self-pay | Admitting: Oncology

## 2023-03-10 ENCOUNTER — Ambulatory Visit
Admission: RE | Admit: 2023-03-10 | Discharge: 2023-03-10 | Disposition: A | Discharge status: Home / Self Care | Payer: Medicare Other | Source: Ambulatory Visit | Attending: Oncology | Admitting: Oncology

## 2023-03-10 DIAGNOSIS — C3490 Malignant neoplasm of unspecified part of unspecified bronchus or lung: Secondary | ICD-10-CM | POA: Diagnosis not present

## 2023-03-10 MED ORDER — GADOBUTROL 1 MMOL/ML IV SOLN
6.0000 mL | Freq: Once | INTRAVENOUS | Status: AC | PRN
  Administered 2023-03-10: 6 mL via INTRAVENOUS

## 2023-03-26 ENCOUNTER — Ambulatory Visit (INDEPENDENT_AMBULATORY_CARE_PROVIDER_SITE_OTHER): Payer: Medicare Other

## 2023-03-26 ENCOUNTER — Ambulatory Visit (INDEPENDENT_AMBULATORY_CARE_PROVIDER_SITE_OTHER): Payer: Medicare Other | Admitting: Internal Medicine

## 2023-03-26 ENCOUNTER — Encounter: Payer: Self-pay | Admitting: Internal Medicine

## 2023-03-26 VITALS — Ht 67.0 in | Wt 143.0 lb

## 2023-03-26 VITALS — BP 110/62 | HR 78 | Ht 67.0 in | Wt 143.0 lb

## 2023-03-26 DIAGNOSIS — I25118 Atherosclerotic heart disease of native coronary artery with other forms of angina pectoris: Secondary | ICD-10-CM

## 2023-03-26 DIAGNOSIS — Z0000 Encounter for general adult medical examination without abnormal findings: Secondary | ICD-10-CM

## 2023-03-26 DIAGNOSIS — E559 Vitamin D deficiency, unspecified: Secondary | ICD-10-CM | POA: Diagnosis not present

## 2023-03-26 DIAGNOSIS — E785 Hyperlipidemia, unspecified: Secondary | ICD-10-CM | POA: Diagnosis not present

## 2023-03-26 DIAGNOSIS — C3490 Malignant neoplasm of unspecified part of unspecified bronchus or lung: Secondary | ICD-10-CM

## 2023-03-26 DIAGNOSIS — I700 Atherosclerosis of aorta: Secondary | ICD-10-CM | POA: Diagnosis not present

## 2023-03-26 DIAGNOSIS — R7303 Prediabetes: Secondary | ICD-10-CM

## 2023-03-26 DIAGNOSIS — R29898 Other symptoms and signs involving the musculoskeletal system: Secondary | ICD-10-CM | POA: Diagnosis not present

## 2023-03-26 NOTE — Progress Notes (Signed)
Subjective:   Kristi [name] is a 77 y.o. female who presents for Medicare Annual (Subsequent) preventive examination.  I connected with  Maud Scarbro [facility] on 03/26/23 by an in person visit and verified that I am speaking with the correct person using two identifiers.  Patient Location: Other:  In Office  Provider Location: Office/Clinic  I discussed the limitations of evaluation and management by telemedicine. The patient expressed understanding and agreed to proceed.   Review of Systems    Defer to PCP Cardiac Risk Factors include: advanced age (>94men, >59 women)     Objective:    Today's Vitals   03/26/23 1553  Weight: 143 lb (64.9 kg)  Height: 5\' 7"  (1.702 m)  PainSc: 0-No pain   Body mass index is 22.4 kg/m.     03/26/2023    3:55 PM 12/09/2022    9:00 AM 09/29/2022   10:07 AM 09/07/2022   10:17 AM 07/13/2022    9:20 AM 06/01/2022    1:42 PM 04/27/2022    1:35 PM  Advanced Directives  Does Patient Have a Medical Advance Directive? Yes Yes Yes  Yes Yes Yes  Type of Advance Directive Living will  Healthcare Power of [place];Living will Healthcare Power of [place];Living will  Living will;Healthcare Power of Attorney   Does patient want to make changes to medical advance directive?  No - Patient declined No - Patient declined      Copy of Healthcare Power of Attorney in Chart?   No - copy requested      Would patient like information on creating a medical advance directive?  No - Patient declined No - Patient declined        Current Medications (verified) Outpatient Encounter Medications as of 03/26/2023  Medication Sig   acetaminophen (TYLENOL) 500 MG tablet Take 2 tablets (1,000 mg total) by mouth every 6 (six) hours as needed for mild pain or headache.   alum & mag hydroxide-simeth (MAALOX/MYLANTA) 200-200-20 MG/5ML suspension Take 30 mLs by mouth every 6 (six) hours as needed for indigestion or heartburn.   apixaban (ELIQUIS) 2.5 MG TABS tablet Take 1 tablet (2.5  mg total) by mouth 2 (two) times daily.   atorvastatin (LIPITOR) 40 MG tablet Take 1 tablet (40 mg total) by mouth daily.   Calcium 600-200 MG-UNIT tablet Take 1 tablet by mouth 2 (two) times daily.   Cholecalciferol (DIALYVITE VITAMIN D 5000) 125 MCG (5000 UT) capsule Take 5,000 Units by mouth daily.   diphenhydrAMINE (BENADRYL) 25 mg capsule Take 1 capsule (25 mg total) by mouth at bedtime as needed (if no sleep even with trazodone).   lidocaine (LIDODERM) 5 % Place 1 patch onto the skin daily. Remove & Discard patch within 12 hours or as directed by MD   Magnesium 250 MG TABS Take 250 mg by mouth daily.   midodrine (PROAMATINE) 5 MG tablet Take 1 tablet (5 mg total) by mouth 3 (three) times daily with meals.   oxyCODONE (OXY IR/ROXICODONE) 5 MG immediate release tablet Take 1 tablet (5 mg total) by mouth every 12 (twelve) hours as needed for severe pain or moderate pain.   pantoprazole (PROTONIX) 40 MG tablet Take 1 tablet (40 mg total) by mouth 2 (two) times daily. (Patient taking differently: Take 40 mg by mouth 2 (two) times daily as needed.)   polyethylene glycol (MIRALAX / GLYCOLAX) 17 g packet Take 17 g by mouth daily as needed for mild constipation.   prochlorperazine (COMPAZINE) 10 MG tablet  Take 10 mg by mouth every 6 (six) hours as needed for nausea or vomiting.   sertraline (ZOLOFT) 100 MG tablet Take 1 tablet (100 mg total) by mouth daily.   sucralfate (CARAFATE) 1 g tablet Take 1 tablet (1 g total) by mouth 3 (three) times daily as needed (heartburn).   traZODone (DESYREL) 50 MG tablet Take 1 tablet (50 mg total) by mouth at bedtime as needed for sleep.   vitamin B-12 (CYANOCOBALAMIN) 1000 MCG tablet Take 1,000 mcg by mouth daily.   Facility-Administered Encounter Medications as of 03/26/2023  Medication   heparin lock flush 100 unit/mL   heparin lock flush 100 unit/mL   sodium chloride flush (NS) 0.9 % injection 10 mL   sodium chloride flush (NS) 0.9 % injection 10 mL     Allergies (verified) Patient has no known allergies.   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   1 stent   Claustrophobia    Coronary artery disease    GERD (gastroesophageal reflux disease)    Hypercholesteremia    MI, old    Pneumonia    Small cell lung cancer    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 10/27/2018   Streptococcal bacteremia 03/25/2022   Past Surgical History:  Procedure Laterality Date   APPENDECTOMY     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   BLADDER NECK SUSPENSION     CHOLECYSTECTOMY N/A 08/14/2018   Procedure: LAPAROSCOPIC CHOLECYSTECTOMY;  Surgeon: Leafy RoPabon, Diego F, MD;  Location: [facility] ORS;  Service: General;  Laterality: N/A;   CHOLECYSTECTOMY  11/2018   CORONARY ANGIOPLASTY  09/29/2014   CORONARY STENT PLACEMENT     ENDOBRONCHIAL ULTRASOUND N/A 10/21/2018   Procedure: ENDOBRONCHIAL ULTRASOUND;  Surgeon: Shane Crutchamachandran, Pradeep, MD;  Location: [facility] ORS;  Service: Pulmonary;  Laterality: N/A;   HERNIA REPAIR  2012   IR FLUORO GUIDE CV LINE RIGHT  12/19/2018   KYPHOPLASTY N/A 03/01/2020   Procedure: T7 KYPHOPLASTY;  Surgeon: Kennedy BuckerMenz, Michael, MD;  Location: [facility] ORS;  Service: Orthopedics;  Laterality: N/A;   LAPAROTOMY     LEFT HEART CATHETERIZATION WITH CORONARY ANGIOGRAM N/A 09/29/2014   Procedure: LEFT HEART CATHETERIZATION WITH CORONARY ANGIOGRAM;  Surgeon: Robynn PaneMohan N Harwani, MD;  Location: [facility] CATH [facility];  Service: Cardiovascular;  Laterality: N/A;   PORTA CATH INSERTION N/A 01/02/2019   Procedure: PORTA CATH INSERTION;  Surgeon: Annice Needyew, Jason S, MD;  Location: [facility] INVASIVE CV LAB;  Service: Cardiovascular;  Laterality: N/A;   PORTACATH PLACEMENT Right 10/28/2018   Procedure: INSERTION PORT-A-CATH;  Surgeon: Leafy RoPabon, Diego F, MD;  Location: [facility] ORS;  Service: General;  Laterality: Right;   TEE WITHOUT CARDIOVERSION Right 03/30/2022   Procedure: TRANSESOPHAGEAL ECHOCARDIOGRAM (TEE);  Surgeon: Laurier NancyKhan, Shaukat A, MD;  Location: [facility]  ORS;  Service: Cardiovascular;  Laterality: Right;   XI ROBOTIC ASSISTED VENTRAL HERNIA N/A 02/27/2021   Procedure: XI ROBOTIC ASSISTED VENTRAL HERNIA (incisional) WITH MESH;  Surgeon: Henrene DodgePiscoya, Jose, MD;  Location: [facility] ORS;  Service: General;  Laterality: N/A;   Family History  Problem Relation Age of Onset   Alzheimer's disease Mother    Colon cancer Father    Breast cancer Neg Hx    Social History   Socioeconomic History   Marital status: Married    Spouse name: [name]    Number of children: 3   Years of education: Not on file   Highest education level: Not on file  Occupational History   Occupation: Retired  Comment: Magazine features editor   Tobacco Use   Smoking status: Former    Packs/day: 1.00    Years: 39.00    Additional pack years: 0.00    Total pack years: 39.00    Types: Cigarettes    Start date: 12/21/1978    Quit date: 10/16/2018    Years since quitting: 4.4   Smokeless tobacco: Never  Vaping Use   Vaping Use: Never used  Substance and Sexual Activity   Alcohol use: Yes    Alcohol/week: 0.0 standard drinks of alcohol    Comment: occassional - approx 1 every 2 weeks    Drug use: No   Sexual activity: Yes    Birth control/protection: None  Other Topics Concern   Not on file  Social History Narrative   Not on file   Social Determinants of Health   Financial Resource Strain: Low Risk  (03/26/2023)   Overall Financial Resource Strain (CARDIA)    Difficulty of Paying Living Expenses: Not hard at all  Food Insecurity: No Food Insecurity (03/26/2023)   Hunger Vital Sign    Worried About Running Out of Food in the Last Year: Never true    Ran Out of Food in the Last Year: Never true  Transportation Needs: No Transportation Needs (03/26/2023)   PRAPARE - Administrator, Civil Service (Medical): No    Lack of Transportation (Non-Medical): No  Physical Activity: Inactive (03/26/2023)   Exercise Vital Sign    Days of Exercise per Week: 0 days    Minutes of  Exercise per Session: 0 min  Stress: No Stress Concern Present (03/26/2023)   [employer] of Occupational Health - Occupational Stress Questionnaire    Feeling of Stress : Only a little  Social Connections: Unknown (01/02/2019)   Social Connection and Isolation Panel [NHANES]    Frequency of Communication with Friends and Family: Three times a week    Frequency of Social Gatherings with Friends and Family: Once a week    Attends Religious Services: 1 to 4 times per year    Active Member of [employer] or Organizations: Not on file    Attends Engineer, structural: Not on file    Marital Status: Married    Tobacco Counseling Counseling given: Not Answered   Clinical Intake:  Pre-visit preparation completed: Yes  Pain : No/denies pain Pain Score: 0-No pain     BMI - recorded: 22.4 Nutritional Status: BMI of 19-24  Normal Nutritional Risks: None Diabetes: No  How often do you need to have someone help you when you read instructions, pamphlets, or other written materials from your doctor or pharmacy?: 1 - Never  Diabetic? No.  Interpreter Needed?: No  Information entered by :: Margaretha Sheffield, CMA   Activities of Daily Living    03/26/2023    3:56 PM  In your present state of health, do you have any difficulty performing the following activities:  Hearing? 0  Vision? 0  Difficulty concentrating or making decisions? 1  Walking or climbing stairs? 1  Dressing or bathing? 1  Doing errands, shopping? 1  Preparing Food and eating ? Y  Using the Toilet? Y  In the past six months, have you accidently leaked urine? N  Do you have problems with loss of bowel control? N  Managing your Medications? Y  Managing your Finances? Y  Housekeeping or managing your Housekeeping? Y    Patient Care Team: Reubin Milan, MD as PCP - General (Internal Medicine)  Rinaldo Cloud, MD as Consulting Physician (Cardiology) Glory Buff, RN as Registered Nurse Carmina Miller, MD  as Radiation Oncologist (Radiation Oncology)  Indicate any recent Medical Services you may have received from other than [facility] providers in the past year (date may be approximate).     Assessment:   This is a routine wellness examination for Candra.  Hearing/Vision screen Hearing Screening - Comments:: No concerns. Vision Screening - Comments:: No concerns.  Dietary issues and exercise activities discussed: Current Exercise Habits: The patient does not participate in regular exercise at present   Goals Addressed   None   Depression Screen    03/26/2023    3:48 PM 06/19/2021   11:27 AM 02/12/2021   11:09 AM 09/19/2018    9:51 AM 02/08/2018    9:36 AM 01/02/2016    3:20 PM  PHQ 2/9 Scores  PHQ - 2 Score 3 2 2  0 0 0  PHQ- 9 Score 15 5 4        Fall Risk    03/26/2023    3:48 PM 06/19/2021   11:27 AM 02/12/2021   11:09 AM 09/02/2020    3:00 PM 05/06/2020   11:00 AM  Fall Risk   Falls in the past year? 1 0 1 0 0  Number falls in past yr: 1  1    Injury with Fall? 1  0    Risk for fall due to : History of fall(s)  Impaired balance/gait;History of fall(s)    Follow up Falls evaluation completed Falls evaluation completed Falls evaluation completed      FALL RISK PREVENTION PERTAINING TO THE HOME:  Any stairs in or around the home? No  If so, are there any without handrails? No  Home free of loose throw rugs in walkways, pet beds, electrical cords, etc? Yes  Adequate lighting in your home to reduce risk of falls? Yes   ASSISTIVE DEVICES UTILIZED TO PREVENT FALLS:  Life alert? No  Use of a cane, walker or w/c? Yes   TIMED UP AND GO:  Was the test performed? Yes .  Gait unsteady with use of assistive device, provider informed and education provided.   Cognitive Function:        06/19/2021   11:34 AM 09/19/2018    9:52 AM  6CIT Screen  What Year? 0 points 0 points  What month? 0 points 0 points  What time? 0 points 0 points  Count back from 20 0 points 0 points   Months in reverse 0 points 0 points  Repeat phrase 4 points 4 points  Total Score 4 points 4 points    Immunizations Immunization History  Administered Date(s) Administered   Fluad Quad(high Dose 65+) 10/20/2021   Influenza,inj,Quad PF,6+ Mos 01/02/2016   PFIZER Comirnaty(Gray Top)Covid-19 Tri-Sucrose Vaccine 04/20/2020, 05/10/2020   Pneumococcal Conjugate-13 01/02/2016   Pneumococcal Polysaccharide-23 09/30/2014   Tdap 01/19/2020    TDAP status: Up to date  Flu Vaccine status: Up to date  Pneumococcal vaccine status: Up to date  Covid-19 vaccine status: Completed vaccines  Qualifies for Shingles Vaccine? Yes   Zostavax completed No   Shingrix Completed?: No.    Education has been provided regarding the importance of this vaccine. Patient has been advised to call insurance company to determine out of pocket expense if they have not yet received this vaccine. Advised may also receive vaccine at local pharmacy or Health Dept. Verbalized acceptance and understanding.  Screening Tests Health Maintenance  Topic Date Due  Zoster Vaccines- Shingrix (1 of 2) Never done   COLONOSCOPY (Pts 45-63yrs Insurance coverage will need to be confirmed)  08/21/2017   MAMMOGRAM  06/09/2019   COVID-19 Vaccine (3 - Pfizer risk series) 06/07/2020   INFLUENZA VACCINE  07/22/2023   Medicare Annual Wellness (AWV)  03/25/2024   DTaP/Tdap/Td (2 - Td or Tdap) 01/18/2030   Pneumonia Vaccine 28+ Years old  Completed   DEXA SCAN  Completed   Hepatitis C Screening  Completed   HPV VACCINES  Aged Out    Health Maintenance  Health Maintenance Due  Topic Date Due   Zoster Vaccines- Shingrix (1 of 2) Never done   COLONOSCOPY (Pts 45-26yrs Insurance coverage will need to be confirmed)  08/21/2017   MAMMOGRAM  06/09/2019   COVID-19 Vaccine (3 - Pfizer risk series) 06/07/2020    Colorectal cancer screening: No longer required.   Mammogram status: No longer required due to age.  Lung Cancer  Screening: (Low Dose CT Chest recommended if Age 73-80 years, 30 pack-year currently smoking OR have quit w/in 15years.) does not qualify.   Additional Screening:  Hepatitis C Screening: does qualify; Completed 02/18/2018  Vision Screening: Recommended annual ophthalmology exams for early detection of glaucoma and other disorders of the eye. Is the patient up to date with their annual eye exam?  Yes  Who is the provider or what is the name of the office in which the patient attends annual eye exams? Dr Clearance Coots at [facility] [place]  Dental Screening: Recommended annual dental exams for proper oral hygiene  Community Resource Referral / Chronic Care Management: CRR required this visit?  No   CCM required this visit?  No      Plan:     I have personally reviewed and noted the following in the patient's chart:   Medical and social history Use of alcohol, tobacco or illicit drugs  Current medications and supplements including opioid prescriptions. Patient is currently taking opioid prescriptions. Information provided to patient regarding non-opioid alternatives. Patient advised to discuss non-opioid treatment plan with their provider. Functional ability and status Nutritional status Physical activity Advanced directives List of other physicians Hospitalizations, surgeries, and ER visits in previous 12 months Vitals Screenings to include cognitive, depression, and falls Referrals and appointments  In addition, I have reviewed and discussed with patient certain preventive protocols, quality metrics, and best practice recommendations. A written personalized care plan for preventive services as well as general preventive health recommendations were provided to patient.     Mariel Sleet, CMA   03/26/2023   Nurse Notes: None.

## 2023-03-26 NOTE — Assessment & Plan Note (Addendum)
Of uncertain cause Likely partially disuse but did not improve with PT Encourage more regular home exercise

## 2023-03-26 NOTE — Assessment & Plan Note (Signed)
Stable without chest pain Very sedentary On appropriate statin therapy

## 2023-03-26 NOTE — Assessment & Plan Note (Addendum)
Tolerating statin medications without concerns - Lipitor 80 mg. LDL is  Lab Results  Component Value Date   LDLCALC 76 03/13/2022   with a goal of < 70. Current dose will be adjusted if needed.

## 2023-03-26 NOTE — Assessment & Plan Note (Signed)
On vitamin D supplement °

## 2023-03-26 NOTE — Assessment & Plan Note (Signed)
Managed with diet. Lab Results  Component Value Date   HGBA1C 4.8 03/14/2022

## 2023-03-26 NOTE — Assessment & Plan Note (Signed)
On statin therapy Check lipids

## 2023-03-26 NOTE — Progress Notes (Signed)
Date:  03/26/2023   Name:  Natalin Bible [facility]   DOB:  [birth year]   MRN:  [id number]   Chief Complaint: No chief complaint on file.  Hyperlipidemia This is a chronic problem. The problem is controlled. Pertinent negatives include no chest pain or shortness of breath. Current antihyperlipidemic treatment includes statins (atorvastatin 80 mg). The current treatment provides significant improvement of lipids.  Diabetes She presents for her follow-up diabetic visit. Diabetes type: prediabetes. Her disease course has been stable. Pertinent negatives for hypoglycemia include no dizziness, headaches or nervousness/anxiousness. Associated symptoms include fatigue. Pertinent negatives for diabetes include no chest pain and no weakness.  Generalized weakness - very limited walking due to leg weakness.  She tried PTx but did not benefit.  She depends on her husband for all ADLs and ambulation.  She uses a walker but only very short distances in their apartment.   Lab Results  Component Value Date   NA 135 12/09/2022   K 3.7 12/09/2022   CO2 23 12/09/2022   GLUCOSE 108 (H) 12/09/2022   BUN 15 12/09/2022   CREATININE 0.86 12/09/2022   CALCIUM 8.7 (L) 12/09/2022   GFRNONAA >60 12/09/2022   Lab Results  Component Value Date   CHOL 153 03/13/2022   HDL 66 03/13/2022   LDLCALC 76 03/13/2022   TRIG 57 03/13/2022   CHOLHDL 2.3 03/13/2022   Lab Results  Component Value Date   TSH 1.906 12/09/2022   Lab Results  Component Value Date   HGBA1C 4.8 03/14/2022   Lab Results  Component Value Date   WBC 6.1 12/09/2022   HGB 11.8 (L) 12/09/2022   HCT 35.0 (L) 12/09/2022   MCV 85.8 12/09/2022   PLT 208 12/09/2022   Lab Results  Component Value Date   ALT 9 12/09/2022   AST 15 12/09/2022   ALKPHOS 49 12/09/2022   BILITOT 0.5 12/09/2022   Lab Results  Component Value Date   VD25OH 32.5 01/06/2018     Review of Systems  Constitutional:  Positive for fatigue. Negative for appetite  change, chills, fever and unexpected weight change.  HENT:  Negative for nosebleeds.   Eyes:  Negative for visual disturbance.  Respiratory:  Negative for cough, chest tightness, shortness of breath and wheezing.   Cardiovascular:  Negative for chest pain, palpitations and leg swelling.  Gastrointestinal:  Negative for abdominal pain, constipation and diarrhea.  Musculoskeletal:  Positive for arthralgias and gait problem.  Neurological:  Negative for dizziness, weakness, light-headedness and headaches.  Psychiatric/Behavioral:  Negative for dysphoric mood. The patient is not nervous/anxious.     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   Diarrhea 12/05/2020   Ventral hernia without obstruction or gangrene 08/12/2020   Frequent falls 01/22/2020   Encounter for antineoplastic immunotherapy 09/14/2019   Memory loss 09/14/2019   Dehydration 01/30/2019   Anemia due to antineoplastic chemotherapy 01/09/2019   Encounter for antineoplastic chemotherapy 11/02/2018   Dysphagia 10/28/2018   Decrease in appetite 10/28/2018   Goals of care, counseling/discussion  10/28/2018   Small cell lung cancer 10/27/2018   Grade I diastolic dysfunction 09/19/2018   Localized edema 08/29/2018   Osteopenia determined by x-ray 05/19/2018   Prediabetes 02/08/2018   Hx of fracture of radius 08/18/2016   Vitamin D deficiency 01/07/2016   Chronic pain 01/02/2016   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   IBS (irritable bowel syndrome) 01/02/2016   DDD (degenerative disc disease), cervical 01/02/2016   Primary osteoarthritis of left knee 01/02/2016   Tobacco use disorder 01/02/2016    No Known Allergies  Past Surgical History:  Procedure Laterality Date   APPENDECTOMY     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   BLADDER NECK SUSPENSION     CHOLECYSTECTOMY N/A 08/14/2018   Procedure: LAPAROSCOPIC CHOLECYSTECTOMY;  Surgeon: Leafy Ro, MD;  Location: [facility] ORS;  Service: General;  Laterality: N/A;   CHOLECYSTECTOMY  11/2018   CORONARY ANGIOPLASTY  09/29/2014   CORONARY STENT PLACEMENT     ENDOBRONCHIAL ULTRASOUND N/A 10/21/2018   Procedure: ENDOBRONCHIAL ULTRASOUND;  Surgeon: Shane Crutch, MD;  Location: [facility] ORS;  Service: Pulmonary;  Laterality: N/A;   HERNIA REPAIR  2012   IR FLUORO GUIDE CV LINE RIGHT  12/19/2018   KYPHOPLASTY N/A 03/01/2020   Procedure: T7 KYPHOPLASTY;  Surgeon: Kennedy Bucker, MD;  Location: [facility] OR[facility];  Service: Orthopedics;  Laterality: N/A;   LAPAROTOMY     LEFT HEART CATHETERIZATION WITH CORONARY ANGIOGRAM N/A 09/29/2014   Procedure: LEFT HEART CATHETERIZATION WITH CORONARY ANGIOGRAM;  Surgeon: Robynn Pane, MD;  Location: [facility];  Service: Cardiovascular;  Laterality: N/A;   PORTA CATH INSERTION N/A 01/02/2019   Procedure: PORTA CATH INSERTION;  Surgeon: Annice Needy, MD;  Location: [facility] INVASIVE CV [facility];  Service: Cardiovascular;  Laterality: N/A;   PORTACATH PLACEMENT Right 10/28/2018   Procedure: INSERTION PORT-A-CATH;  Surgeon: Leafy Ro, MD;  Location: [facility] OR[facility];  Service: General;  Laterality: Right;   TEE WITHOUT CARDIOVERSION Right 03/30/2022   Procedure: TRANSESOPHAGEAL ECHOCARDIOGRAM (TEE);  Surgeon: Laurier Nancy, MD;  Location: [facility] OR[facility];  Service: Cardiovascular;  Laterality: Right;   XI ROBOTIC ASSISTED VENTRAL HERNIA N/A 02/27/2021   Procedure: XI ROBOTIC ASSISTED VENTRAL HERNIA (incisional) WITH MESH;  Surgeon:  Henrene Dodge, MD;  Location: [facility] OR[facility];  Service: General;  Laterality: N/A;    Social History   Tobacco Use   Smoking status: Former    Packs/day: 1.00    Years: 39.00    Additional pack years: 0.00    Total pack years: 39.00    Types: Cigarettes    Start date: 12/21/1978    Quit date: 10/16/2018    Years since quitting: 4.4   Smokeless tobacco: Never  Vaping Use   Vaping Use: Never used  Substance Use Topics   Alcohol use: Yes    Alcohol/week: 0.0 standard drinks of alcohol    Comment: occassional - approx 1 every 2 weeks    Drug use: No     Medication list has been reviewed and updated.  Current Meds  Medication Sig   acetaminophen (TYLENOL) 500 MG tablet Take 2 tablets (1,000 mg total) by mouth every 6 (six) hours as needed for mild pain or headache.   alum & mag hydroxide-simeth (MAALOX/MYLANTA) 200-200-20 MG/5ML suspension Take 30 mLs by mouth every 6 (six) hours as needed for indigestion or heartburn.   apixaban (ELIQUIS) 2.5 MG TABS tablet Take 1 tablet (2.5 mg total) by mouth 2 (two) times daily.   atorvastatin (  LIPITOR) 40 MG tablet Take 1 tablet (40 mg total) by mouth daily.   Calcium 600-200 MG-UNIT tablet Take 1 tablet by mouth 2 (two) times daily.   Cholecalciferol (DIALYVITE VITAMIN D 5000) 125 MCG (5000 UT) capsule Take 5,000 Units by mouth daily.   diphenhydrAMINE (BENADRYL) 25 mg capsule Take 1 capsule (25 mg total) by mouth at bedtime as needed (if no sleep even with trazodone).   lidocaine (LIDODERM) 5 % Place 1 patch onto the skin daily. Remove & Discard patch within 12 hours or as directed by MD   Magnesium 250 MG TABS Take 250 mg by mouth daily.   midodrine (PROAMATINE) 5 MG tablet Take 1 tablet (5 mg total) by mouth 3 (three) times daily with meals.   oxyCODONE (OXY IR/ROXICODONE) 5 MG immediate release tablet Take 1 tablet (5 mg total) by mouth every 12 (twelve) hours as needed for severe pain or moderate pain.   pantoprazole (PROTONIX) 40 MG tablet  Take 1 tablet (40 mg total) by mouth 2 (two) times daily. (Patient taking differently: Take 40 mg by mouth 2 (two) times daily as needed.)   polyethylene glycol (MIRALAX / GLYCOLAX) 17 g packet Take 17 g by mouth daily as needed for mild constipation.   prochlorperazine (COMPAZINE) 10 MG tablet Take 10 mg by mouth every 6 (six) hours as needed for nausea or vomiting.   sertraline (ZOLOFT) 100 MG tablet Take 1 tablet (100 mg total) by mouth daily.   sucralfate (CARAFATE) 1 g tablet Take 1 tablet (1 g total) by mouth 3 (three) times daily as needed (heartburn).   traZODone (DESYREL) 50 MG tablet Take 1 tablet (50 mg total) by mouth at bedtime as needed for sleep.   vitamin B-12 (CYANOCOBALAMIN) 1000 MCG tablet Take 1,000 mcg by mouth daily.       03/26/2023    3:48 PM 06/19/2021   11:27 AM 02/12/2021   11:09 AM  GAD 7 : Generalized Anxiety Score  Nervous, Anxious, on Edge 2 1 0  Control/stop worrying 3 1 0  Worry too much - different things 3 1 0  Trouble relaxing 1 0 0  Restless 0 0 0  Easily annoyed or irritable 2 1 0  Afraid - awful might happen 1 0 0  Total GAD 7 Score 12 4 0  Anxiety Difficulty Somewhat difficult  Not difficult at all       03/26/2023    3:48 PM 06/19/2021   11:27 AM 02/12/2021   11:09 AM  Depression screen PHQ 2/9  Decreased Interest 0 1 1  Down, Depressed, Hopeless 3 1 1   PHQ - 2 Score 3 2 2   Altered sleeping 1 0 1  Tired, decreased energy 2 3 1   Change in appetite 1 0 0  Feeling bad or failure about yourself  2 0 0  Trouble concentrating 3 0 0  Moving slowly or fidgety/restless 2 0 0  Suicidal thoughts 1 0 0  PHQ-9 Score 15 5 4   Difficult doing work/chores Not difficult at all  Not difficult at all    BP Readings from Last 3 Encounters:  03/26/23 110/62  12/09/22 93/76  10/20/22 103/79    Physical Exam Vitals and nursing note reviewed.  Constitutional:      General: She is not in acute distress.    Appearance: Normal appearance. She is  well-developed.  HENT:     Head: Normocephalic and atraumatic.  Cardiovascular:     Rate and Rhythm: Normal rate and  regular rhythm.  Pulmonary:     Effort: Pulmonary effort is normal. No respiratory distress.     Breath sounds: No wheezing or rhonchi.  Musculoskeletal:     Cervical back: Normal range of motion.  Lymphadenopathy:     Cervical: No cervical adenopathy.  Skin:    General: Skin is warm and dry.     Capillary Refill: Capillary refill takes less than 2 seconds.     Findings: No rash.  Neurological:     General: No focal deficit present.     Mental Status: She is alert and oriented to person, place, and time.     Gait: Gait abnormal.  Psychiatric:        Mood and Affect: Mood normal.        Behavior: Behavior normal.     Wt Readings from Last 3 Encounters:  03/26/23 143 lb (64.9 kg)  03/26/23 143 lb (64.9 kg)  12/09/22 140 lb (63.5 kg)    BP 110/62   Pulse 78   Ht  (1.702 m)   Wt 143 lb (64.9 kg)   SpO2 97%   BMI 22.40 kg/m   Assessment and Plan:  Problem List Items Addressed This Visit       Cardiovascular and Mediastinum   Aortic atherosclerosis    On statin therapy Check lipids      Coronary artery disease with stable angina pectoris    Stable without chest pain Very sedentary On appropriate statin therapy        Respiratory   Small cell lung cancer (Chronic)    Followed by Oncology - treatment on hold for 6 months Recent MRI brain showed no change CT chest/abd/pelvis pending later this month        Other   Dyslipidemia    Tolerating statin medications without concerns - Lipitor 80 mg. LDL is  Lab Results  Component Value Date   LDLCALC 76 03/13/2022  with a goal of < 70. Current dose will be adjusted if needed.       Relevant Orders   Lipid panel   Leg weakness, bilateral    Of uncertain cause Likely partially disuse but did not improve with PT Encourage more regular home exercise      Prediabetes - Primary     Managed with diet. Lab Results  Component Value Date   HGBA1C 4.8 03/14/2022        Relevant Orders   Hemoglobin A1c   Vitamin D deficiency    On vitamin D supplement      Relevant Orders   VITAMIN D 25 Hydroxy (Vit-D Deficiency, Fractures)    No follow-ups on file.   MAW completed by CMA.  Partially dictated using Dragon software, any errors are not intentional.  Reubin Milan, MD [facility] [place], [place]

## 2023-03-26 NOTE — Assessment & Plan Note (Signed)
Followed by Oncology - treatment on hold for 6 months Recent MRI brain showed no change CT chest/abd/pelvis pending later this month

## 2023-03-27 LAB — LIPID PANEL
Chol/HDL Ratio: 4.3 ratio (ref 0.0–4.4)
Cholesterol, Total: 282 mg/dL — ABNORMAL HIGH (ref 100–199)
HDL: 65 mg/dL (ref >39)
LDL Chol Calc (NIH): 196 mg/dL — ABNORMAL HIGH (ref 0–99)
Triglycerides: 119 mg/dL (ref 0–149)
VLDL Cholesterol Cal: 21 mg/dL (ref 5–40)

## 2023-03-27 LAB — HEMOGLOBIN A1C
Est. average glucose Bld gHb Est-mCnc: 111 mg/dL
Hgb A1c MFr Bld: 5.5 % (ref 4.8–5.6)

## 2023-03-27 LAB — VITAMIN D 25 HYDROXY (VIT D DEFICIENCY, FRACTURES): Vit D, 25-Hydroxy: 51.3 ng/mL (ref 30.0–100.0)

## 2023-03-30 ENCOUNTER — Other Ambulatory Visit: Payer: Self-pay | Admitting: *Deleted

## 2023-03-30 DIAGNOSIS — I700 Atherosclerosis of aorta: Secondary | ICD-10-CM

## 2023-03-31 NOTE — Telephone Encounter (Signed)
Please review.  KP

## 2023-03-31 NOTE — Telephone Encounter (Signed)
Requested medication (s) are due for refill today: yes  Requested medication (s) are on the active medication list: yes  Last refill:  12/19/21  Future visit scheduled: yes  Notes to clinic:  Unable to refill per protocol, last refill by another provider not at this practice, routing for approval.     Requested Prescriptions  Pending Prescriptions Disp Refills   atorvastatin (LIPITOR) 40 MG tablet 30 tablet 3    Sig: Take 1 tablet (40 mg total) by mouth daily.     Cardiovascular:  Antilipid - Statins Failed - 03/30/2023  9:24 AM      Failed - Lipid Panel in normal range within the last 12 months    Cholesterol, Total  Date Value Ref Range Status  03/26/2023 282 (H) 100 - 199 mg/dL Final   LDL Chol Calc (NIH)  Date Value Ref Range Status  03/26/2023 196 (H) 0 - 99 mg/dL Final   HDL  Date Value Ref Range Status  03/26/2023 65 >39 mg/dL Final   Triglycerides  Date Value Ref Range Status  03/26/2023 119 0 - 149 mg/dL Final         Passed - Patient is not pregnant      Passed - Valid encounter within last 12 months    Recent Outpatient Visits           5 days ago Prediabetes   [facility] [facility], Nyoka Cowden, MD   1 year ago Gait disturbance   [place] [facility], Nyoka Cowden, MD   2 years ago Atherosclerosis of native coronary artery of native heart with angina pectoris [facility])   [place] [facility] at [facility], Nyoka Cowden, MD   4 years ago Medicare annual wellness visit, subsequent   [facility] at [facility], Nyoka Cowden, MD   4 years ago Localized edema   [facility] at [facility], Nyoka Cowden, MD

## 2023-04-02 ENCOUNTER — Other Ambulatory Visit: Payer: Self-pay | Admitting: Internal Medicine

## 2023-04-02 DIAGNOSIS — I700 Atherosclerosis of aorta: Secondary | ICD-10-CM

## 2023-04-02 MED ORDER — ATORVASTATIN CALCIUM 40 MG PO TABS: 40.0000 mg | ORAL_TABLET | Freq: Every day | ORAL | 3 refills | Status: DC

## 2023-04-09 ENCOUNTER — Telehealth: Payer: Self-pay | Admitting: Oncology

## 2023-04-09 NOTE — Telephone Encounter (Signed)
Patients husband called and she has been called out of town and will not be able to make her appointments 4/25 and 4/30. He would like a call to him to r/s to closer to the end of the month. Please advise on scheduling.   Please call Riley Lam at [phone]. Thank you

## 2023-04-13 ENCOUNTER — Ambulatory Visit: Admission: RE | Admit: 2023-04-13 | Payer: Medicare Other | Source: Ambulatory Visit

## 2023-04-15 ENCOUNTER — Other Ambulatory Visit: Payer: Medicare Other

## 2023-04-20 ENCOUNTER — Ambulatory Visit: Payer: Medicare Other | Admitting: Oncology

## 2023-04-20 ENCOUNTER — Ambulatory Visit: Payer: Medicare Other

## 2023-04-29 ENCOUNTER — Inpatient Hospital Stay: Payer: Medicare Other | Attending: Oncology

## 2023-04-29 ENCOUNTER — Telehealth: Payer: Self-pay

## 2023-04-29 DIAGNOSIS — C3490 Malignant neoplasm of unspecified part of unspecified bronchus or lung: Secondary | ICD-10-CM | POA: Diagnosis not present

## 2023-04-29 DIAGNOSIS — Z87891 Personal history of nicotine dependence: Secondary | ICD-10-CM | POA: Insufficient documentation

## 2023-04-29 DIAGNOSIS — Z79899 Other long term (current) drug therapy: Secondary | ICD-10-CM | POA: Insufficient documentation

## 2023-04-29 DIAGNOSIS — M8580 Other specified disorders of bone density and structure, unspecified site: Secondary | ICD-10-CM | POA: Diagnosis not present

## 2023-04-29 LAB — CBC WITH DIFFERENTIAL/PLATELET
Abs Immature Granulocytes: 0.08 10*3/uL — ABNORMAL HIGH (ref 0.00–0.07)
Basophils Absolute: 0.1 10*3/uL (ref 0.0–0.1)
Basophils Relative: 1 %
Eosinophils Absolute: 0.2 10*3/uL (ref 0.0–0.5)
Eosinophils Relative: 3 %
HCT: 36.1 % (ref 36.0–46.0)
Hemoglobin: 12 g/dL (ref 12.0–15.0)
Immature Granulocytes: 1 %
Lymphocytes Relative: 22 %
Lymphs Abs: 1.6 10*3/uL (ref 0.7–4.0)
MCH: 28.9 pg (ref 26.0–34.0)
MCHC: 33.2 g/dL (ref 30.0–36.0)
MCV: 87 fL (ref 80.0–100.0)
Monocytes Absolute: 0.9 10*3/uL (ref 0.1–1.0)
Monocytes Relative: 13 %
Neutro Abs: 4.4 10*3/uL (ref 1.7–7.7)
Neutrophils Relative %: 60 %
Platelets: 212 10*3/uL (ref 150–400)
RBC: 4.15 MIL/uL (ref 3.87–5.11)
RDW: 13.8 % (ref 11.5–15.5)
WBC: 7.3 10*3/uL (ref 4.0–10.5)
nRBC: 0 % (ref 0.0–0.2)

## 2023-04-29 LAB — LACTATE DEHYDROGENASE: LDH: 100 U/L (ref 98–192)

## 2023-04-29 LAB — COMPREHENSIVE METABOLIC PANEL
ALT: 8 U/L (ref 0–44)
AST: 12 U/L — ABNORMAL LOW (ref 15–41)
Albumin: 3.8 g/dL (ref 3.5–5.0)
Alkaline Phosphatase: 48 U/L (ref 38–126)
Anion gap: 9 (ref 5–15)
BUN: 19 mg/dL (ref 8–23)
CO2: 23 mmol/L (ref 22–32)
Calcium: 9.4 mg/dL (ref 8.9–10.3)
Chloride: 103 mmol/L (ref 98–111)
Creatinine, Ser: 0.88 mg/dL (ref 0.44–1.00)
GFR, Estimated: >60 mL/min (ref >60)
Glucose, Bld: 117 mg/dL — ABNORMAL HIGH (ref 70–99)
Potassium: 3.4 mmol/L — ABNORMAL LOW (ref 3.5–5.1)
Sodium: 135 mmol/L (ref 135–145)
Total Bilirubin: 0.4 mg/dL (ref 0.3–1.2)
Total Protein: 7.6 g/dL (ref 6.5–8.1)

## 2023-04-29 MED ORDER — HEPARIN SOD (PORK) LOCK FLUSH 100 UNIT/ML IV SOLN
500.0000 [IU] | Freq: Once | INTRAVENOUS | Status: AC
  Administered 2023-04-29: 500 [IU] via INTRAVENOUS
  Filled 2023-04-29: qty 5

## 2023-04-29 MED ORDER — SODIUM CHLORIDE 0.9% FLUSH
10.0000 mL | INTRAVENOUS | Status: DC | PRN
  Administered 2023-04-29: 10 mL via INTRAVENOUS
  Filled 2023-04-29: qty 10

## 2023-04-29 NOTE — Telephone Encounter (Signed)
Looks like pt did not get CT done. Please r/s CT asap and move MD/ zometa to be 2-3 days after labs.   (No to all CT questions, no need for oral contrast, Just IV)

## 2023-05-03 ENCOUNTER — Telehealth: Payer: Self-pay | Admitting: Oncology

## 2023-05-03 NOTE — Telephone Encounter (Signed)
Vm left with patient to make her aware that the CT department will be calling her today to schedule CT.

## 2023-05-04 ENCOUNTER — Other Ambulatory Visit: Payer: Self-pay

## 2023-05-04 ENCOUNTER — Encounter: Payer: Self-pay | Admitting: Oncology

## 2023-05-04 ENCOUNTER — Inpatient Hospital Stay: Payer: Medicare Other

## 2023-05-04 ENCOUNTER — Inpatient Hospital Stay: Payer: Medicare Other | Admitting: Oncology

## 2023-05-04 ENCOUNTER — Telehealth: Payer: Self-pay | Admitting: Oncology

## 2023-05-04 DIAGNOSIS — C3490 Malignant neoplasm of unspecified part of unspecified bronchus or lung: Secondary | ICD-10-CM

## 2023-05-04 NOTE — Telephone Encounter (Signed)
Thank you. Appt reminders mailed as well.

## 2023-05-04 NOTE — Telephone Encounter (Signed)
VM left with patient to remind her of CT scan scheduled on 5/15 and her rescheduled appointments here at the cancer center on 5/20.

## 2023-05-05 ENCOUNTER — Ambulatory Visit
Admission: RE | Admit: 2023-05-05 | Discharge: 2023-05-05 | Disposition: A | Discharge status: Home / Self Care | Payer: Medicare Other | Source: Ambulatory Visit | Attending: Oncology | Admitting: Oncology

## 2023-05-05 DIAGNOSIS — J439 Emphysema, unspecified: Secondary | ICD-10-CM | POA: Diagnosis not present

## 2023-05-05 DIAGNOSIS — C3490 Malignant neoplasm of unspecified part of unspecified bronchus or lung: Secondary | ICD-10-CM | POA: Insufficient documentation

## 2023-05-05 DIAGNOSIS — K439 Ventral hernia without obstruction or gangrene: Secondary | ICD-10-CM | POA: Diagnosis not present

## 2023-05-05 MED ORDER — IOHEXOL 300 MG/ML  SOLN
100.0000 mL | Freq: Once | INTRAMUSCULAR | Status: AC | PRN
  Administered 2023-05-05: 100 mL via INTRAVENOUS

## 2023-05-08 ENCOUNTER — Encounter: Payer: Self-pay | Admitting: Oncology

## 2023-05-10 ENCOUNTER — Inpatient Hospital Stay: Payer: Medicare Other

## 2023-05-10 ENCOUNTER — Inpatient Hospital Stay (HOSPITAL_BASED_OUTPATIENT_CLINIC_OR_DEPARTMENT_OTHER): Payer: Medicare Other | Admitting: Oncology

## 2023-05-10 ENCOUNTER — Encounter: Payer: Self-pay | Admitting: Oncology

## 2023-05-10 VITALS — BP 94/76 | HR 91 | Temp 95.5°F | Resp 18 | Wt 133.0 lb

## 2023-05-10 DIAGNOSIS — E860 Dehydration: Secondary | ICD-10-CM

## 2023-05-10 DIAGNOSIS — C3490 Malignant neoplasm of unspecified part of unspecified bronchus or lung: Secondary | ICD-10-CM | POA: Diagnosis not present

## 2023-05-10 DIAGNOSIS — R634 Abnormal weight loss: Secondary | ICD-10-CM | POA: Insufficient documentation

## 2023-05-10 DIAGNOSIS — Z8589 Personal history of malignant neoplasm of other organs and systems: Secondary | ICD-10-CM | POA: Diagnosis not present

## 2023-05-10 DIAGNOSIS — Z95828 Presence of other vascular implants and grafts: Secondary | ICD-10-CM

## 2023-05-10 DIAGNOSIS — I2699 Other pulmonary embolism without acute cor pulmonale: Secondary | ICD-10-CM

## 2023-05-10 DIAGNOSIS — M8580 Other specified disorders of bone density and structure, unspecified site: Secondary | ICD-10-CM | POA: Diagnosis not present

## 2023-05-10 DIAGNOSIS — Z79899 Other long term (current) drug therapy: Secondary | ICD-10-CM | POA: Diagnosis not present

## 2023-05-10 DIAGNOSIS — Z87891 Personal history of nicotine dependence: Secondary | ICD-10-CM | POA: Diagnosis not present

## 2023-05-10 LAB — BASIC METABOLIC PANEL - CANCER CENTER ONLY
Anion gap: 9 (ref 5–15)
BUN: 19 mg/dL (ref 8–23)
CO2: 22 mmol/L (ref 22–32)
Calcium: 9.2 mg/dL (ref 8.9–10.3)
Chloride: 105 mmol/L (ref 98–111)
Creatinine: 1.02 mg/dL — ABNORMAL HIGH (ref 0.44–1.00)
GFR, Estimated: 57 mL/min — ABNORMAL LOW (ref >60)
Glucose, Bld: 108 mg/dL — ABNORMAL HIGH (ref 70–99)
Potassium: 3.7 mmol/L (ref 3.5–5.1)
Sodium: 136 mmol/L (ref 135–145)

## 2023-05-10 MED ORDER — SODIUM CHLORIDE 0.9 % IV SOLN
Freq: Once | INTRAVENOUS | Status: AC
  Filled 2023-05-10: qty 250

## 2023-05-10 MED ORDER — ZOLEDRONIC ACID 4 MG/5ML IV CONC
3.3000 mg | Freq: Once | INTRAVENOUS | Status: AC
  Administered 2023-05-10: 3.3 mg via INTRAVENOUS
  Filled 2023-05-10: qty 4.13

## 2023-05-10 MED ORDER — HEPARIN SOD (PORK) LOCK FLUSH 100 UNIT/ML IV SOLN
500.0000 [IU] | Freq: Once | INTRAVENOUS | Status: AC | PRN
  Administered 2023-05-10: 500 [IU]
  Filled 2023-05-10: qty 5

## 2023-05-10 MED ORDER — APIXABAN 2.5 MG PO TABS: 2.5000 mg | ORAL_TABLET | Freq: Two times a day (BID) | ORAL | 5 refills | Status: AC

## 2023-05-10 NOTE — Assessment & Plan Note (Addendum)
#  Extensive small cell lung cancer, S/p 4 cycles of Carboplatin, Etoposide and Tecentriq.   Previously Tecentriq maintenance, discontinued Tecentriq since Oct 2023. Clinically doing very well.  CT chest abdomen pelvis imaging and MRI brain recently showed no evidence of cancer recurrence. Continue surveillance., repeat imaging in 6 months.

## 2023-05-10 NOTE — Progress Notes (Signed)
Hematology/Oncology Progress note [phone] [phone]      Patient Care Team: Reubin Milan, MD as PCP - General (Internal Medicine) Rinaldo Cloud, MD as Consulting Physician (Cardiology) Glory Buff, RN as Registered Nurse Carmina Miller, MD as Radiation Oncologist (Radiation Oncology)  REASON FOR VISIT:  Follow-up for small cell lung cancer,   ASSESSMENT & PLAN:     Small cell lung cancer (HCC) #Extensive small cell lung cancer, S/p 4 cycles of Carboplatin, Etoposide and Tecentriq.   Previously Tecentriq maintenance, discontinued Tecentriq since Oct 2023. Clinically doing very well.  CT chest abdomen pelvis imaging and MRI brain recently showed no evidence of cancer recurrence. Continue surveillance., repeat imaging in 6 months.   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  Continue calcium supplementation.    Port-A-Cath in place Continue port flush every 6-8 weeks.   Weight loss Recommend nutritionist evaluation.  She declined.  Recommend nutrition supplements  Pulmonary embolus (HCC) #Acute pulmonary embolism, unprovoked, recurrent. Continue Eliquis 2.5mg  BID for prophylaxis. Rx sent.     Orders Placed This Encounter  Procedures   MR Brain W Wo Contrast    Standing Status:   Future    Standing Expiration Date:   05/09/2024    Order Specific Question:   If indicated for the ordered procedure, I authorize the administration of contrast media per Radiology protocol    Answer:   Yes    Order Specific Question:   What is the patient's sedation requirement?    Answer:   No Sedation    Order Specific Question:   Does the patient have a pacemaker or implanted devices?    Answer:   No    Order Specific Question:   Use SRS Protocol?    Answer:   No    Order Specific Question:   Preferred imaging location?    Answer:   [facility] (table limit - 550lbs)    CT CHEST ABDOMEN PELVIS W CONTRAST    Standing Status:   Future    Standing Expiration Date:   05/09/2024    Order Specific Question:   If indicated for the ordered procedure, I authorize the administration of contrast media per Radiology protocol    Answer:   Yes    Order Specific Question:   Does the patient have a contrast media/X-ray dye allergy?    Answer:   No    Order Specific Question:   Preferred imaging location?    Answer:   [facility]    Order Specific Question:   If indicated for the ordered procedure, I authorize the administration of oral contrast media per Radiology protocol    Answer:   Yes   Follow-up in  6 months.   All questions were answered. The patient knows to call the clinic with any problems, questions or concerns.  Rickard Patience, MD, PhD [facility] Hematology Oncology 05/10/2023        HISTORY OF PRESENTING ILLNESS:  Kristi [name] is a  77 y.o.  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  10/20/2018 CT chest with contrast showed large mediastinal mass involving both hilar, left greater than right, consistent with lung carcinoma, The mass causes narrowing of the left mainstem bronchus with resultant volume loss on the left and a mediastinal shift to the left.  Moderate size left pleural effusion. Patient underwent E bus bronchoscopy  10/21/2018 Left mainstem bronchus transbronchial forcep biopsy showed small cell carcinoma.  # Initial MRI brain negative.  # Nov 2019- Jan 2020 s/p 4 cycles of Carbo/Etoposide/Tecentriq #  01/24/2019 interim CT scan done which showed continued positive response to therapy with continued reduction in mediastinal adenopathy.  No residual measurable left lung mass. CT findings of acute emphysematous cystitis. Urology did not feel that patient has pyelonephritis and recommend treatment with antibiotics because patient's immunocompromise. Patient finished treatment.   #  11/02/2018-01/09/2019 Chemotherapy carboplatin + Etoposide + tecentriq # Consolidation chest radiation and whole brain radiation finished in May 2020 # 04/25/2019 resume Tecentriq every 3 weeks. # Patient had a fall from her stairs on 01/19/2020. In the emergency room she had CT chest abdomen pelvis CT, CT head without contrast, CT cervical spine without contrast. No CT evidence for acute intracranial abnormality, degenerative changes of cervical spine..  No CT evidence of acute thoracic abdomen injury.  Severe compression fracture of T7, subacute.  # kyphoplasty procedure on 03/01/2020.  T7 biopsy negative for cancer.  11/20/2020 Surveillance CT scan  Reduced size and prominence of right lateral lung base subpleural nodular density. No findings of active malignancy.  MRI brain showed stable post treatment brain. No metastatic disease.   # 10/29/2021 MRI brain w and wo contrast showed two definite new enhancing lesions in cerebellum, with associated restricted diffusion and increased T2 signal, concerning for new foci of metastatic disease. 2. A third lesion in the right cerebellar hemisphere was likely present on the prior exam but continues to show limited contrast enhancement on SPGR; this lesion is associated with diffusion restriction and increased T2 signal, but, on coronal and sagittal T1 sequences, it correlates with an area affected by pulsation artifact. This is also felt to represent a new focus of metastatic disease.  11/20/2021 PET scan - no FDG avid disease in neck chest abdomen or pelvis.  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 No enhancing metastatic disease seen today, chronic small vessel ischemia.   12/19/2021 was seen by neurology Dr.Vaslow. increase ASA to 325mg  daily. Atorvastatin.  40 mg  03/13/2022 - 03/18/2022, patient was hospitalized due to lower extremity  weakness CT head did not show any abnormality.  MRI brain confirmed no evidence of new CVA.  Patient was seen by neurologist and weakness was felt to be multifactorial. CT chest angiogram protocol showed bilateral pulmonary emboli, patient was started on heparin drip and transition to Xarelto at discharge. For 03/25/2022-4 /10/2022, patient was readmitted due to positive blood culture which was done during her previous admission.  Blood culture was positive for Streptococcus species in both bottles.  Patient had soft blood pressures initially and was given supportive care and IV antibiotics.  She completed antibiotic course.  At discharge, she was recommended to continue anticoagulation.  Both patient and husband are not aware about diagnosis of pulmonary embolism and not aware about new prescription of Xarelto. Xarelto appeared to be prescribed and prescription was printed out.  Patient and husband both not aware about the new prescription.  Patient has been off anticoagulation presumably since 03/31/2022.  Occasionally she has shortness of breath.  No chest pain.  She reports feeling 100% better today.  #09/03/2022 recurrent pulmonary embolism due to patient ran out of Eliquis.  Restarted on Eliquis  INTERVAL HISTORY Kristi [name] is a 77 y.o. female who has above history reviewed by me  today presents for evaluation prior to immunotherapy for treatment of extensive small cell lung cancer Patient was on Eliquis 2.5 mg twice daily.  She tolerates well.  No bleeding.  + forgetful Appetite is fair. She has lost weight.    Review of Systems  Constitutional:  Positive for fatigue and unexpected weight change. Negative for appetite change, chills and fever.  HENT:   Negative for hearing loss and voice change.   Eyes:  Negative for eye problems.  Respiratory:  Negative for chest tightness, cough and shortness of breath.   Cardiovascular:  Negative for chest pain.  Gastrointestinal:  Negative for  abdominal distention, abdominal pain, blood in stool, diarrhea and nausea.  Endocrine: Negative for hot flashes.  Genitourinary:  Negative for difficulty urinating and frequency.   Musculoskeletal:  Positive for gait problem. Negative for arthralgias and back pain.       Lower extremity weakness  Skin:  Negative for itching and rash.  Neurological:  Positive for gait problem. Negative for extremity weakness, headaches and light-headedness.  Hematological:  Negative for adenopathy.  Psychiatric/Behavioral:  Negative for confusion and depression. The patient is not nervous/anxious.        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   1 stent   Claustrophobia    Coronary artery disease    GERD (gastroesophageal reflux disease)    Hypercholesteremia    MI, old    Pneumonia    Small cell lung cancer (HCC)    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 (HCC) 10/27/2018   Streptococcal bacteremia 03/25/2022    SURGICAL HISTORY: Past Surgical History:  Procedure Laterality Date   APPENDECTOMY     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   BLADDER NECK SUSPENSION     CHOLECYSTECTOMY N/A 08/14/2018   Procedure: LAPAROSCOPIC CHOLECYSTECTOMY;  Surgeon: Leafy Ro, MD;  Location: [facility] ORS;  Service: General;  Laterality: N/A;   CHOLECYSTECTOMY  11/2018   CORONARY ANGIOPLASTY  09/29/2014   CORONARY STENT PLACEMENT     ENDOBRONCHIAL ULTRASOUND N/A 10/21/2018   Procedure: ENDOBRONCHIAL ULTRASOUND;  Surgeon: Shane Crutch, MD;  Location: [facility] ORS;  Service: Pulmonary;  Laterality: N/A;   HERNIA REPAIR  2012   IR FLUORO GUIDE CV LINE RIGHT  12/19/2018   KYPHOPLASTY N/A 03/01/2020   Procedure: T7 KYPHOPLASTY;  Surgeon: Kennedy Bucker, MD;  Location: [facility] ORS;  Service: Orthopedics;  Laterality: N/A;   LAPAROTOMY     LEFT HEART CATHETERIZATION WITH CORONARY ANGIOGRAM N/A 09/29/2014   Procedure: LEFT HEART CATHETERIZATION WITH  CORONARY ANGIOGRAM;  Surgeon: Robynn Pane, MD;  Location: [facility];  Service: Cardiovascular;  Laterality: N/A;   PORTA CATH INSERTION N/A 01/02/2019   Procedure: PORTA CATH INSERTION;  Surgeon: Annice Needy, MD;  Location: [facility] INVASIVE CV LAB;  Service: Cardiovascular;  Laterality: N/A;   PORTACATH PLACEMENT Right 10/28/2018   Procedure: INSERTION PORT-A-CATH;  Surgeon: Leafy Ro, MD;  Location: [facility] ORS;  Service: General;  Laterality: Right;   TEE WITHOUT CARDIOVERSION Right 03/30/2022   Procedure: TRANSESOPHAGEAL ECHOCARDIOGRAM (TEE);  Surgeon: Laurier Nancy, MD;  Location: [facility] ORS;  Service: Cardiovascular;  Laterality: Right;   XI ROBOTIC ASSISTED VENTRAL HERNIA N/A 02/27/2021   Procedure: XI ROBOTIC ASSISTED VENTRAL HERNIA (incisional) WITH MESH;  Surgeon: Henrene Dodge, MD;  Location: [facility] ORS;  Service: General;  Laterality: N/A;    SOCIAL HISTORY: Social History   Socioeconomic History  Marital status: Married    Spouse name: [name]    Number of children: 3   Years of education: Not on file   Highest education level: Not on file  Occupational History   Occupation: Retired    Comment: Magazine features editor   Tobacco Use   Smoking status: Former    Packs/day: 1.00    Years: 39.00    Additional pack years: 0.00    Total pack years: 39.00    Types: Cigarettes    Start date: 12/21/1978    Quit date: 10/16/2018    Years since quitting: 4.5   Smokeless tobacco: Never  Vaping Use   Vaping Use: Never used  Substance and Sexual Activity   Alcohol use: Yes    Alcohol/week: 0.0 standard drinks of alcohol    Comment: occassional - approx 1 every 2 weeks    Drug use: No   Sexual activity: Yes    Birth control/protection: None  Other Topics Concern   Not on file  Social History Narrative   Not on file   Social Determinants of Health   Financial Resource Strain: Low Risk  (03/26/2023)   Overall Financial Resource Strain (CARDIA)    Difficulty of Paying Living Expenses:  Not hard at all  Food Insecurity: No Food Insecurity (03/26/2023)   Hunger Vital Sign    Worried About Running Out of Food in the Last Year: Never true    Ran Out of Food in the Last Year: Never true  Transportation Needs: No Transportation Needs (03/26/2023)   PRAPARE - Administrator, Civil Service (Medical): No    Lack of Transportation (Non-Medical): No  Physical Activity: Inactive (03/26/2023)   Exercise Vital Sign    Days of Exercise per Week: 0 days    Minutes of Exercise per Session: 0 min  Stress: No Stress Concern Present (03/26/2023)   [employer] of Occupational Health - Occupational Stress Questionnaire    Feeling of Stress : Only a little  Social Connections: Unknown (01/02/2019)   Social Connection and Isolation Panel [NHANES]    Frequency of Communication with Friends and Family: Three times a week    Frequency of Social Gatherings with Friends and Family: Once a week    Attends Religious Services: 1 to 4 times per year    Active Member of [employer] or Organizations: Not on file    Attends Banker Meetings: Not on file    Marital Status: Married  Intimate Partner Violence: Not At Risk (03/26/2023)   Humiliation, Afraid, Rape, and Kick questionnaire    Fear of Current or Ex-Partner: No    Emotionally Abused: No    Physically Abused: No    Sexually Abused: No    FAMILY HISTORY: Family History  Problem Relation Age of Onset   Alzheimer's disease Mother    Colon cancer Father    Breast cancer Neg Hx     ALLERGIES:  has No Known Allergies.  MEDICATIONS:  Current Outpatient Medications  Medication Sig Dispense Refill   acetaminophen (TYLENOL) 500 MG tablet Take 2 tablets (1,000 mg total) by mouth every 6 (six) hours as needed for mild pain or headache.     alum & mag hydroxide-simeth (MAALOX/MYLANTA) 200-200-20 MG/5ML suspension Take 30 mLs by mouth every 6 (six) hours as needed for indigestion or heartburn. 355 mL 0   atorvastatin (LIPITOR) 40  MG tablet Take 1 tablet (40 mg total) by mouth daily. 90 tablet 3   Calcium 600-200 MG-UNIT  tablet Take 1 tablet by mouth 2 (two) times daily.     Cholecalciferol (DIALYVITE VITAMIN D 5000) 125 MCG (5000 UT) capsule Take 5,000 Units by mouth daily.     diphenhydrAMINE (BENADRYL) 25 mg capsule Take 1 capsule (25 mg total) by mouth at bedtime as needed (if no sleep even with trazodone). 30 capsule 0   lidocaine (LIDODERM) 5 % Place 1 patch onto the skin daily. Remove & Discard patch within 12 hours or as directed by MD 30 patch 0   Magnesium 250 MG TABS Take 250 mg by mouth daily.     midodrine (PROAMATINE) 5 MG tablet Take 1 tablet (5 mg total) by mouth 3 (three) times daily with meals. 90 tablet 1   oxyCODONE (OXY IR/ROXICODONE) 5 MG immediate release tablet Take 1 tablet (5 mg total) by mouth every 12 (twelve) hours as needed for severe pain or moderate pain. 30 tablet 0   pantoprazole (PROTONIX) 40 MG tablet Take 1 tablet (40 mg total) by mouth 2 (two) times daily. (Patient taking differently: Take 40 mg by mouth 2 (two) times daily as needed.) 60 tablet 0   polyethylene glycol (MIRALAX / GLYCOLAX) 17 g packet Take 17 g by mouth daily as needed for mild constipation. 14 each 0   prochlorperazine (COMPAZINE) 10 MG tablet Take 10 mg by mouth every 6 (six) hours as needed for nausea or vomiting.     sertraline (ZOLOFT) 100 MG tablet Take 1 tablet (100 mg total) by mouth daily. 90 tablet 3   sucralfate (CARAFATE) 1 g tablet Take 1 tablet (1 g total) by mouth 3 (three) times daily as needed (heartburn). 90 tablet 1   traZODone (DESYREL) 50 MG tablet Take 1 tablet (50 mg total) by mouth at bedtime as needed for sleep. 30 tablet 2   vitamin B-12 (CYANOCOBALAMIN) 1000 MCG tablet Take 1,000 mcg by mouth daily.     apixaban (ELIQUIS) 2.5 MG TABS tablet Take 1 tablet (2.5 mg total) by mouth 2 (two) times daily. 60 tablet 5   No current facility-administered medications for this visit.    Facility-Administered Medications Ordered in Other Visits  Medication Dose Route Frequency Provider Last Rate Last Admin   heparin lock flush 100 unit/mL  500 Units Intravenous Once Rickard Patience, MD       heparin lock flush 100 unit/mL  500 Units Intravenous Once Rickard Patience, MD       sodium chloride flush (NS) 0.9 % injection 10 mL  10 mL Intravenous PRN Rickard Patience, MD   10 mL at 01/09/19 0820   sodium chloride flush (NS) 0.9 % injection 10 mL  10 mL Intravenous PRN Rickard Patience, MD   10 mL at 01/10/19 1400     PHYSICAL EXAMINATION: ECOG PERFORMANCE STATUS: 1 - Symptomatic but completely ambulatory Vitals:   05/10/23 0848  BP: 94/76  Pulse: 91  Resp: 18  Temp: (!) 95.5 F (35.3 C)  SpO2: 100%   Filed Weights   05/10/23 0848  Weight: 133 lb (60.3 kg)   Physical Examination Today's Vitals   05/10/23 0848  BP: 94/76  Pulse: 91  Resp: 18  Temp: (!) 95.5 F (35.3 C)  TempSrc: Tympanic  SpO2: 100%  Weight: 133 lb (60.3 kg)  PainSc: 0-No pain   Body mass index is 20.83 kg/m.   Physical Exam Constitutional:      General: She is not in acute distress.    Appearance: She is not diaphoretic.  Comments: Patient sits in the wheelchair Frail appearance  HENT:     Head: Normocephalic and atraumatic.     Nose: Nose normal.     Mouth/Throat:     Pharynx: No oropharyngeal exudate.  Eyes:     General: No scleral icterus.    Pupils: Pupils are equal, round, and reactive to light.  Cardiovascular:     Rate and Rhythm: Normal rate and regular rhythm.     Heart sounds: No murmur heard. Pulmonary:     Effort: Pulmonary effort is normal. No respiratory distress.     Breath sounds: No wheezing or rales.     Comments: Decreased breath sounds bilaterally.  Abdominal:     General: There is no distension.     Palpations: Abdomen is soft.     Tenderness: There is no abdominal tenderness.  Musculoskeletal:        General: Normal range of motion.     Cervical back: Normal range of  motion and neck supple.  Skin:    General: Skin is warm and dry.     Findings: No erythema.  Neurological:     Mental Status: She is alert and oriented to person, place, and time.     Cranial Nerves: No cranial nerve deficit.     Motor: No abnormal muscle tone.     Coordination: Coordination normal.  Psychiatric:        Mood and Affect: Affect normal.        LABORATORY DATA:  I have reviewed the data as listed    Latest Ref Rng & Units 04/29/2023   10:53 AM 12/09/2022    9:12 AM 10/20/2022   10:09 AM  CBC  WBC 4.0 - 10.5 K/uL 7.3  6.1  5.5   Hemoglobin 12.0 - 15.0 g/dL 95.2  84.1  32.4   Hematocrit 36.0 - 46.0 % 36.1  35.0  35.6   Platelets 150 - 400 K/uL 212  208  202       Latest Ref Rng & Units 05/10/2023    8:43 AM 04/29/2023   10:53 AM 12/09/2022    9:12 AM  CMP  Glucose 70 - 99 mg/dL 401  027  253   BUN 8 - 23 mg/dL 19  19  15    Creatinine 0.44 - 1.00 mg/dL 6.64  4.03  4.74   Sodium 135 - 145 mmol/L 136  135  135   Potassium 3.5 - 5.1 mmol/L 3.7  3.4  3.7   Chloride 98 - 111 mmol/L 105  103  106   CO2 22 - 32 mmol/L 22  23  23    Calcium 8.9 - 10.3 mg/dL 9.2  9.4  8.7   Total Protein 6.5 - 8.1 g/dL  7.6  7.1   Total Bilirubin 0.3 - 1.2 mg/dL  0.4  0.5   Alkaline Phos 38 - 126 U/L  48  49   AST 15 - 41 U/L  12  15   ALT 0 - 44 U/L  8  9        RADIOGRAPHIC STUDIES: I have personally reviewed the radiological images as listed and agreed with the findings in the report. CT CHEST ABDOMEN PELVIS W CONTRAST  Result Date: 05/06/2023 CLINICAL DATA:  Lung cancer restaging * Tracking Code: BO * EXAM: CT CHEST, ABDOMEN, AND PELVIS WITH CONTRAST TECHNIQUE: Multidetector CT imaging of the chest, abdomen and pelvis was performed following the standard protocol during bolus administration of intravenous contrast. RADIATION DOSE  REDUCTION: This exam was performed according to the departmental dose-optimization program which includes automated exposure control, adjustment of  the mA and/or kV according to patient size and/or use of iterative reconstruction technique. CONTRAST:  OMNIPAQUE IOHEXOL 300 MG/ML  SOLN COMPARISON:  12/04/2022 FINDINGS: CT CHEST FINDINGS Cardiovascular: Right chest port catheter. Aortic atherosclerosis. Aortic valve calcifications. Normal heart size. Three-vessel coronary artery calcifications. No pericardial effusion. Mediastinum/Nodes: No enlarged mediastinal, hilar, or axillary lymph nodes. Small hiatal hernia. Thyroid gland, trachea, and esophagus demonstrate no significant findings. Lungs/Pleura: Mild centrilobular and paraseptal emphysema. Bandlike scarring of the bilateral lung bases. No pleural effusion or pneumothorax. Musculoskeletal: No chest wall abnormality. No acute osseous findings. Unchanged inferior endplate deformity of T4. Unchanged high-grade wedge deformity of T7 status post vertebral cement augmentation (series 6, image 113). CT ABDOMEN PELVIS FINDINGS Hepatobiliary: No focal liver abnormality is seen. Status post cholecystectomy. No biliary dilatation. Pancreas: Unremarkable. No pancreatic ductal dilatation or surrounding inflammatory changes. Spleen: Normal in size without significant abnormality. Adrenals/Urinary Tract: Adrenal glands are unremarkable. Kidneys are normal, without renal calculi, solid lesion, or hydronephrosis. Bladder is unremarkable. Stomach/Bowel: Stomach is within normal limits. Appendix not clearly visualized and may be surgically absent. No evidence of bowel wall thickening, distention, or inflammatory changes. Vascular/Lymphatic: Aortic atherosclerosis. No enlarged abdominal or pelvic lymph nodes. Reproductive: No mass or other abnormality. Other: Midline ventral mesh hernia repair (series 2, image 59). No ascites. Pelvic floor laxity with cystocele (series 6, image 101). Musculoskeletal: No acute osseous findings. IMPRESSION: 1. No evidence of recurrent or metastatic disease in the chest, abdomen, or pelvis.  2. Emphysema. 3. Coronary artery disease. 4. Aortic valve calcifications. Correlate for echocardiographic evidence of aortic valve dysfunction. 5. Pelvic floor laxity with cystocele. Aortic Atherosclerosis (ICD10-I70.0) and Emphysema (ICD10-J43.9). Electronically Signed   By: Jearld Lesch M.D.   On: 05/06/2023 17:35   MR Brain W Wo Contrast  Result Date: 03/13/2023 CLINICAL DATA:  Small cell lung cancer.  Staging. EXAM: MRI HEAD WITHOUT AND WITH CONTRAST TECHNIQUE: Multiplanar, multiecho pulse sequences of the brain and surrounding structures were obtained without and with intravenous contrast. CONTRAST:  6mL GADAVIST GADOBUTROL 1 MMOL/ML IV SOLN COMPARISON:  07/23/2022 FINDINGS: Brain: Diffusion imaging does not show any acute or subacute infarction or other cause of restricted diffusion. There are advanced chronic T2 and FLAIR changes throughout the cerebral hemispheric white matter consistent with previous cranial radiation. Small-vessel ischemic changes are noted within the pons. Few old small vessel cerebellar infarctions. No large vessel territory stroke. Mild chronic widespread dural thickening and enhancement is stable. No sign active brain metastasis. No hemorrhage, hydrocephalus or extra-axial collection. Vascular: Major vessels at the base of the brain show flow. Skull and upper cervical spine: Negative Sinuses/Orbits: Clear/normal Other: None IMPRESSION: 1. No change since the study of 07/23/2022. No evidence of active brain metastasis. 2. Extensive chronic T2 and FLAIR changes throughout the cerebral hemispheric white matter consistent with previous cranial radiation. 3. Mild chronic dural thickening and enhancement, unchanged. 4. Chronic small-vessel ischemic changes of the pons and cerebellum. Electronically Signed   By: Paulina Fusi M.D.   On: 03/13/2023 11:55

## 2023-05-10 NOTE — Assessment & Plan Note (Signed)
#  Acute pulmonary embolism, unprovoked, recurrent. Continue Eliquis 2.5mg  BID for prophylaxis. Rx sent.

## 2023-05-10 NOTE — Assessment & Plan Note (Signed)
bone metastasis and Osteopenia Continue zometa, increase interval to Q6 months.  Continue calcium supplementation.

## 2023-05-10 NOTE — Assessment & Plan Note (Signed)
Continue port flush every 6-8 weeks.  

## 2023-05-10 NOTE — Assessment & Plan Note (Signed)
Recommend nutritionist evaluation.  She declined.  Recommend nutrition supplements

## 2023-07-05 ENCOUNTER — Inpatient Hospital Stay: Payer: Medicare Other | Attending: Oncology

## 2023-07-12 ENCOUNTER — Other Ambulatory Visit: Payer: Self-pay

## 2023-07-12 ENCOUNTER — Emergency Department: Payer: Medicare Other

## 2023-07-12 ENCOUNTER — Inpatient Hospital Stay
Admission: EM | Admit: 2023-07-12 | Discharge: 2023-07-14 | DRG: 690 | Disposition: A | Discharge status: Home / Self Care | Payer: Medicare Other | Attending: Internal Medicine | Admitting: Internal Medicine

## 2023-07-12 DIAGNOSIS — Z87891 Personal history of nicotine dependence: Secondary | ICD-10-CM | POA: Diagnosis not present

## 2023-07-12 DIAGNOSIS — Z79899 Other long term (current) drug therapy: Secondary | ICD-10-CM | POA: Diagnosis not present

## 2023-07-12 DIAGNOSIS — F32A Depression, unspecified: Secondary | ICD-10-CM | POA: Diagnosis present

## 2023-07-12 DIAGNOSIS — R531 Weakness: Secondary | ICD-10-CM

## 2023-07-12 DIAGNOSIS — Z6822 Body mass index (BMI) 22.0-22.9, adult: Secondary | ICD-10-CM | POA: Diagnosis not present

## 2023-07-12 DIAGNOSIS — I2510 Atherosclerotic heart disease of native coronary artery without angina pectoris: Secondary | ICD-10-CM | POA: Diagnosis present

## 2023-07-12 DIAGNOSIS — I4510 Unspecified right bundle-branch block: Secondary | ICD-10-CM | POA: Diagnosis present

## 2023-07-12 DIAGNOSIS — E7800 Pure hypercholesterolemia, unspecified: Secondary | ICD-10-CM | POA: Diagnosis present

## 2023-07-12 DIAGNOSIS — I6782 Cerebral ischemia: Secondary | ICD-10-CM | POA: Diagnosis not present

## 2023-07-12 DIAGNOSIS — Z85118 Personal history of other malignant neoplasm of bronchus and lung: Secondary | ICD-10-CM

## 2023-07-12 DIAGNOSIS — Z800 Family history of malignant neoplasm of digestive organs: Secondary | ICD-10-CM | POA: Diagnosis not present

## 2023-07-12 DIAGNOSIS — Z820 Family history of epilepsy and other diseases of the nervous system: Secondary | ICD-10-CM

## 2023-07-12 DIAGNOSIS — Z1152 Encounter for screening for COVID-19: Secondary | ICD-10-CM | POA: Diagnosis not present

## 2023-07-12 DIAGNOSIS — E860 Dehydration: Secondary | ICD-10-CM | POA: Diagnosis not present

## 2023-07-12 DIAGNOSIS — K219 Gastro-esophageal reflux disease without esophagitis: Secondary | ICD-10-CM

## 2023-07-12 DIAGNOSIS — Z66 Do not resuscitate: Secondary | ICD-10-CM | POA: Diagnosis present

## 2023-07-12 DIAGNOSIS — R627 Adult failure to thrive: Secondary | ICD-10-CM

## 2023-07-12 DIAGNOSIS — N390 Urinary tract infection, site not specified: Principal | ICD-10-CM | POA: Diagnosis present

## 2023-07-12 DIAGNOSIS — E785 Hyperlipidemia, unspecified: Secondary | ICD-10-CM | POA: Diagnosis not present

## 2023-07-12 DIAGNOSIS — R8271 Bacteriuria: Secondary | ICD-10-CM | POA: Diagnosis not present

## 2023-07-12 DIAGNOSIS — Z86711 Personal history of pulmonary embolism: Secondary | ICD-10-CM | POA: Diagnosis not present

## 2023-07-12 DIAGNOSIS — Z0389 Encounter for observation for other suspected diseases and conditions ruled out: Secondary | ICD-10-CM | POA: Diagnosis not present

## 2023-07-12 DIAGNOSIS — Z9221 Personal history of antineoplastic chemotherapy: Secondary | ICD-10-CM | POA: Diagnosis not present

## 2023-07-12 DIAGNOSIS — R410 Disorientation, unspecified: Secondary | ICD-10-CM | POA: Diagnosis not present

## 2023-07-12 DIAGNOSIS — R519 Headache, unspecified: Secondary | ICD-10-CM | POA: Diagnosis not present

## 2023-07-12 DIAGNOSIS — B9620 Unspecified Escherichia coli [E. coli] as the cause of diseases classified elsewhere: Secondary | ICD-10-CM | POA: Diagnosis present

## 2023-07-12 DIAGNOSIS — R0689 Other abnormalities of breathing: Secondary | ICD-10-CM | POA: Diagnosis not present

## 2023-07-12 DIAGNOSIS — Z7901 Long term (current) use of anticoagulants: Secondary | ICD-10-CM

## 2023-07-12 DIAGNOSIS — I252 Old myocardial infarction: Secondary | ICD-10-CM

## 2023-07-12 DIAGNOSIS — Z955 Presence of coronary angioplasty implant and graft: Secondary | ICD-10-CM

## 2023-07-12 LAB — T4, FREE: Free T4: 1.2 ng/dL — ABNORMAL HIGH (ref 0.61–1.12)

## 2023-07-12 LAB — CBC WITH DIFFERENTIAL/PLATELET
Abs Immature Granulocytes: 0.03 K/uL (ref 0.00–0.07)
Basophils Absolute: 0 K/uL (ref 0.0–0.1)
Basophils Relative: 1 %
Eosinophils Absolute: 0.2 K/uL (ref 0.0–0.5)
Eosinophils Relative: 3 %
HCT: 35.6 % — ABNORMAL LOW (ref 36.0–46.0)
Hemoglobin: 11.8 g/dL — ABNORMAL LOW (ref 12.0–15.0)
Immature Granulocytes: 1 %
Lymphocytes Relative: 20 %
Lymphs Abs: 1.2 K/uL (ref 0.7–4.0)
MCH: 28.6 pg (ref 26.0–34.0)
MCHC: 33.1 g/dL (ref 30.0–36.0)
MCV: 86.4 fL (ref 80.0–100.0)
Monocytes Absolute: 0.8 K/uL (ref 0.1–1.0)
Monocytes Relative: 14 %
Neutro Abs: 3.8 K/uL (ref 1.7–7.7)
Neutrophils Relative %: 61 %
Platelets: 183 K/uL (ref 150–400)
RBC: 4.12 MIL/uL (ref 3.87–5.11)
RDW: 13.6 % (ref 11.5–15.5)
WBC: 6.1 K/uL (ref 4.0–10.5)
nRBC: 0 % (ref 0.0–0.2)

## 2023-07-12 LAB — URINALYSIS, W/ REFLEX TO CULTURE (INFECTION SUSPECTED)
Bilirubin Urine: NEGATIVE
Glucose, UA: NEGATIVE mg/dL
Ketones, ur: NEGATIVE mg/dL
Nitrite: NEGATIVE
Protein, ur: NEGATIVE mg/dL
Specific Gravity, Urine: 1.008 (ref 1.005–1.030)
WBC, UA: NONE SEEN WBC/hpf (ref 0–5)
pH: 8 (ref 5.0–8.0)

## 2023-07-12 LAB — TSH: TSH: 0.732 u[IU]/mL (ref 0.350–4.500)

## 2023-07-12 LAB — COMPREHENSIVE METABOLIC PANEL
ALT: 9 U/L (ref 0–44)
AST: 15 U/L (ref 15–41)
Albumin: 3.6 g/dL (ref 3.5–5.0)
Alkaline Phosphatase: 39 U/L (ref 38–126)
Anion gap: 7 (ref 5–15)
BUN: 9 mg/dL (ref 8–23)
CO2: 22 mmol/L (ref 22–32)
Calcium: 8.6 mg/dL — ABNORMAL LOW (ref 8.9–10.3)
Chloride: 107 mmol/L (ref 98–111)
Creatinine, Ser: 0.87 mg/dL (ref 0.44–1.00)
GFR, Estimated: >60 mL/min (ref >60)
Glucose, Bld: 95 mg/dL (ref 70–99)
Potassium: 3.8 mmol/L (ref 3.5–5.1)
Sodium: 136 mmol/L (ref 135–145)
Total Bilirubin: 0.6 mg/dL (ref 0.3–1.2)
Total Protein: 6.6 g/dL (ref 6.5–8.1)

## 2023-07-12 LAB — MAGNESIUM: Magnesium: 2 mg/dL (ref 1.7–2.4)

## 2023-07-12 LAB — RESP PANEL BY RT-PCR (RSV, FLU A&B, COVID)  RVPGX2
Influenza A by PCR: NEGATIVE
Influenza B by PCR: NEGATIVE
Resp Syncytial Virus by PCR: NEGATIVE
SARS Coronavirus 2 by RT PCR: NEGATIVE

## 2023-07-12 LAB — TROPONIN I (HIGH SENSITIVITY)
Troponin I (High Sensitivity): 3 ng/L (ref <18)
Troponin I (High Sensitivity): 4 ng/L (ref <18)

## 2023-07-12 LAB — LACTIC ACID, PLASMA
Lactic Acid, Venous: 1.5 mmol/L (ref 0.5–1.9)
Lactic Acid, Venous: 1.6 mmol/L (ref 0.5–1.9)

## 2023-07-12 MED ORDER — SERTRALINE HCL 50 MG PO TABS
100.0000 mg | ORAL_TABLET | Freq: Every day | ORAL | Status: DC
  Administered 2023-07-13 – 2023-07-14 (×2): 100 mg via ORAL
  Filled 2023-07-12 (×2): qty 2

## 2023-07-12 MED ORDER — ONDANSETRON HCL 4 MG/2ML IJ SOLN: 4.0000 mg | Freq: Four times a day (QID) | INTRAMUSCULAR | Status: DC | PRN

## 2023-07-12 MED ORDER — MIDODRINE HCL 5 MG PO TABS
5.0000 mg | ORAL_TABLET | Freq: Three times a day (TID) | ORAL | Status: DC
  Administered 2023-07-13: 5 mg via ORAL
  Filled 2023-07-12: qty 1

## 2023-07-12 MED ORDER — CYANOCOBALAMIN 500 MCG PO TABS
1000.0000 ug | ORAL_TABLET | Freq: Every day | ORAL | Status: DC
  Administered 2023-07-13 – 2023-07-14 (×2): 1000 ug via ORAL
  Filled 2023-07-12 (×2): qty 2

## 2023-07-12 MED ORDER — SODIUM CHLORIDE 0.9 % IV SOLN
1.0000 g | Freq: Once | INTRAVENOUS | Status: AC
  Administered 2023-07-12: 1 g via INTRAVENOUS
  Filled 2023-07-12: qty 10

## 2023-07-12 MED ORDER — ALUM & MAG HYDROXIDE-SIMETH 200-200-20 MG/5ML PO SUSP: 30.0000 mL | Freq: Four times a day (QID) | ORAL | Status: DC | PRN

## 2023-07-12 MED ORDER — SODIUM CHLORIDE 0.9 % IV SOLN: INTRAVENOUS | Status: DC

## 2023-07-12 MED ORDER — ACETAMINOPHEN 650 MG RE SUPP: 650.0000 mg | Freq: Four times a day (QID) | RECTAL | Status: DC | PRN

## 2023-07-12 MED ORDER — DIPHENHYDRAMINE HCL 25 MG PO CAPS: 25.0000 mg | ORAL_CAPSULE | Freq: Every evening | ORAL | Status: DC | PRN

## 2023-07-12 MED ORDER — POLYETHYLENE GLYCOL 3350 17 G PO PACK: 17.0000 g | PACK | Freq: Every day | ORAL | Status: DC | PRN

## 2023-07-12 MED ORDER — ONDANSETRON HCL 4 MG PO TABS: 4.0000 mg | ORAL_TABLET | Freq: Four times a day (QID) | ORAL | Status: DC | PRN

## 2023-07-12 MED ORDER — ENSURE ENLIVE PO LIQD
237.0000 mL | Freq: Two times a day (BID) | ORAL | Status: DC
  Administered 2023-07-13 – 2023-07-14 (×2): 237 mL via ORAL

## 2023-07-12 MED ORDER — VITAMIN D 25 MCG (1000 UNIT) PO TABS
2000.0000 [IU] | ORAL_TABLET | Freq: Every day | ORAL | Status: DC
  Administered 2023-07-13 – 2023-07-14 (×2): 2000 [IU] via ORAL
  Filled 2023-07-12 (×4): qty 2

## 2023-07-12 MED ORDER — OYSTER SHELL CALCIUM/D3 500-5 MG-MCG PO TABS
1.0000 | ORAL_TABLET | Freq: Two times a day (BID) | ORAL | Status: DC
  Administered 2023-07-13 – 2023-07-14 (×2): 1 via ORAL
  Filled 2023-07-12 (×2): qty 1

## 2023-07-12 MED ORDER — OXYCODONE HCL 5 MG PO TABS: 5.0000 mg | ORAL_TABLET | Freq: Two times a day (BID) | ORAL | Status: DC | PRN

## 2023-07-12 MED ORDER — MAGNESIUM HYDROXIDE 400 MG/5ML PO SUSP: 30.0000 mL | Freq: Every day | ORAL | Status: DC | PRN

## 2023-07-12 MED ORDER — PROCHLORPERAZINE MALEATE 10 MG PO TABS: 10.0000 mg | ORAL_TABLET | Freq: Four times a day (QID) | ORAL | Status: DC | PRN

## 2023-07-12 MED ORDER — MAGNESIUM OXIDE -MG SUPPLEMENT 400 (240 MG) MG PO TABS
200.0000 mg | ORAL_TABLET | Freq: Every day | ORAL | Status: DC
  Administered 2023-07-13 – 2023-07-14 (×2): 200 mg via ORAL
  Filled 2023-07-12 (×2): qty 1

## 2023-07-12 MED ORDER — SODIUM CHLORIDE 0.9 % IV SOLN
1.0000 g | INTRAVENOUS | Status: AC
  Administered 2023-07-13: 1 g via INTRAVENOUS
  Filled 2023-07-12: qty 10

## 2023-07-12 MED ORDER — TRAZODONE HCL 50 MG PO TABS
25.0000 mg | ORAL_TABLET | Freq: Every evening | ORAL | Status: DC | PRN
  Administered 2023-07-12 – 2023-07-14 (×2): 25 mg via ORAL
  Filled 2023-07-12 (×2): qty 1

## 2023-07-12 MED ORDER — SODIUM CHLORIDE 0.9 % IV BOLUS
1000.0000 mL | Freq: Once | INTRAVENOUS | Status: AC
  Administered 2023-07-12: 1000 mL via INTRAVENOUS

## 2023-07-12 MED ORDER — TRAZODONE HCL 50 MG PO TABS: 50.0000 mg | ORAL_TABLET | Freq: Every evening | ORAL | Status: DC | PRN

## 2023-07-12 MED ORDER — APIXABAN 2.5 MG PO TABS
2.5000 mg | ORAL_TABLET | Freq: Two times a day (BID) | ORAL | Status: DC
  Administered 2023-07-12 – 2023-07-14 (×4): 2.5 mg via ORAL
  Filled 2023-07-12 (×4): qty 1

## 2023-07-12 MED ORDER — LIDOCAINE 5 % EX PTCH
1.0000 | MEDICATED_PATCH | CUTANEOUS | Status: DC
  Administered 2023-07-13 – 2023-07-14 (×2): 1 via TRANSDERMAL
  Filled 2023-07-12 (×2): qty 1

## 2023-07-12 MED ORDER — PANTOPRAZOLE SODIUM 40 MG PO TBEC: 40.0000 mg | DELAYED_RELEASE_TABLET | Freq: Two times a day (BID) | ORAL | Status: DC | PRN

## 2023-07-12 MED ORDER — ATORVASTATIN CALCIUM 20 MG PO TABS
40.0000 mg | ORAL_TABLET | Freq: Every day | ORAL | Status: DC
  Administered 2023-07-13: 40 mg via ORAL
  Filled 2023-07-12: qty 2

## 2023-07-12 MED ORDER — ACETAMINOPHEN 325 MG PO TABS
650.0000 mg | ORAL_TABLET | Freq: Four times a day (QID) | ORAL | Status: DC | PRN
  Administered 2023-07-12: 650 mg via ORAL
  Filled 2023-07-12: qty 2

## 2023-07-12 MED ORDER — SUCRALFATE 1 G PO TABS: 1.0000 g | ORAL_TABLET | Freq: Three times a day (TID) | ORAL | Status: DC | PRN

## 2023-07-12 NOTE — Assessment & Plan Note (Signed)
-   We will continue statin therapy. 

## 2023-07-12 NOTE — ED Provider Notes (Signed)
[facility] Provider Note    Event Date/Time   First MD Initiated Contact with Patient 07/12/23 1337     (approximate)   History   Altered Mental Status   HPI  Kristi [name] is a 77 y.o. female   Past medical history of lung cancer in remission no longer on chemotherapy for several months, PE on eliquis, CAD, HLD, who presents with 3 days of generalized weakness, fatigue, poor p.o. intake loss of appetite and nausea.    No respiratory infectious symptoms, abdominal pain, urinary symptoms.    No known sick contacts.    No falls.    Compliant w eliquis  Independent Historian contributed to assessment above: husband at bedside corroborates info above  External Medical Documents Reviewed: oncology note from May 2024 documenting cancer history in remission, hx PE on eliquis      Physical Exam   Triage Vital Signs: ED Triage Vitals  Encounter Vitals Group     BP      Systolic BP Percentile      Diastolic BP Percentile      Pulse      Resp      Temp      Temp src      SpO2      Weight      Height      Head Circumference      Peak Flow      Pain Score      Pain Loc      Pain Education      Exclude from Growth Chart     Most recent vital signs: Vitals:   07/12/23 1339 07/12/23 1430  BP: 139/87 (!) 146/86  Pulse: 80 76  Resp: 14 (!) 21  Temp: 97.8 F (36.6 C)   SpO2: 100% 97%    General: Awake, no distress.  CV:  Good peripheral perfusion.  Resp:  Normal effort.  Abd:  No distention.  Other:  Frail chronically ill appearing, dehydrated appearing with dry mucus membranes. Soft non tender abd and clear lungs. No fever.  She has global weakness throughout but no facial asymmetry or no unilateral weakness or sensory deficit.   ED Results / Procedures / Treatments   Labs (all labs ordered are listed, but only abnormal results are displayed) Labs Reviewed  COMPREHENSIVE METABOLIC PANEL - Abnormal; Notable for the following  components:      Result Value   Calcium 8.6 (*)    All other components within normal limits  CBC WITH DIFFERENTIAL/PLATELET - Abnormal; Notable for the following components:   Hemoglobin 11.8 (*)    HCT 35.6 (*)    All other components within normal limits  URINALYSIS, W/ REFLEX TO CULTURE (INFECTION SUSPECTED) - Abnormal; Notable for the following components:   Color, Urine YELLOW (*)    APPearance CLOUDY (*)    Hgb urine dipstick SMALL (*)    Leukocytes,Ua TRACE (*)    Bacteria, UA MANY (*)    All other components within normal limits  T4, FREE - Abnormal; Notable for the following components:   Free T4 1.20 (*)    All other components within normal limits  RESP PANEL BY RT-PCR (RSV, FLU A&B, COVID)  [id number]  URINE CULTURE  LACTIC ACID, PLASMA  TSH  MAGNESIUM  LACTIC ACID, PLASMA  TROPONIN I (HIGH SENSITIVITY)     I ordered and reviewed the above labs they are notable for many bacteria in urine. WBC nl  EKG  ED ECG REPORT I, Pilar Jarvis, the attending physician, personally viewed and interpreted this ECG.   Date: 07/12/2023  EKG Time: 1339  Rate: 79  Rhythm: sinus vs AF (more likely sinus reg rate w tremors altering baseline)   Axis: nl  Intervals:none  ST&T Change: no stemi    RADIOLOGY I independently reviewed and interpreted chest x-ray and see no obvious focalities or pneumothorax I also reviewed radiologist's formal read.   PROCEDURES:  Critical Care performed: No  Procedures   MEDICATIONS ORDERED IN ED: Medications  cefTRIAXone (ROCEPHIN) 1 g in sodium chloride 0.9 % 100 mL IVPB (1 g Intravenous New Bag/Given 07/12/23 1459)  sodium chloride 0.9 % bolus 1,000 mL (1,000 mLs Intravenous New Bag/Given 07/12/23 1406)    External physician / consultants:  I spoke with hospitalist for admission and regarding care plan for this patient.   IMPRESSION / MDM / ASSESSMENT AND PLAN / ED COURSE  I reviewed the triage vital signs and the nursing notes.                                 Patient's presentation is most consistent with acute presentation with potential threat to life or bodily function.  Differential diagnosis includes, but is not limited to, [place] tract infection, dehydration electrolyte disturbance, respiratory infection, viral URI, ACS, sepsis   The patient is on the cardiac monitor to evaluate for evidence of arrhythmia and/or significant heart rate changes.  MDM: This is a patient with very vague symptoms of generalized weakness and fatigue, intermittent nausea, concern for infection or metabolic derangement to account for her fatigue.  Many bacteria in the urine without inflammatory changes, covered with IV Rocephin for urinary tract infection and culture was sent.    She denies respiratory infectious symptoms and has a clear chest x-ray doubt bacterial pneumonia.  She is on Eliquis, did not fall, does have a mild headache, will check CT head rule out ICH  Considered ACS, PE, other cardiopulmonary emergencies but given no shortness of breath, chest pain and compliance with Eliquis I doubt.  Doubt CVA given no focal neurologic findings.  Her labs are otherwise unremarkable, and her vital signs are normal, doubt sepsis no signs of sepsis.  Given signs of UTI and profound fatigue, admission.       FINAL CLINICAL IMPRESSION(S) / ED DIAGNOSES   Final diagnoses:  Dehydration  Generalized weakness  Lower urinary tract infectious disease     Rx / DC Orders   ED Discharge Orders     None        Note:  This document was prepared using Dragon voice recognition software and may include unintentional dictation errors.    Pilar Jarvis, MD 07/12/23 [phone]

## 2023-07-12 NOTE — Assessment & Plan Note (Signed)
-   We will continue Eliquis. 

## 2023-07-12 NOTE — Assessment & Plan Note (Signed)
-   We will continue PPI therapy as well as Carafate.

## 2023-07-12 NOTE — Assessment & Plan Note (Signed)
-   We will continue IV Rocephin and follow urine culture and sensitivity. ?

## 2023-07-12 NOTE — H&P (Signed)
[place]   PATIENT NAME: Kristi [name]    MR#:  [id number]  DATE OF BIRTH:  19-Jul-1946  DATE OF ADMISSION:  07/12/2023  PRIMARY CARE PHYSICIAN: Reubin Milan, MD   Patient is coming from: Home  REQUESTING/REFERRING PHYSICIAN: Pilar Jarvis, MD  CHIEF COMPLAINT:   Chief Complaint  Patient presents with   Altered Mental Status    HISTORY OF PRESENT ILLNESS:  Kristi [name] is a 76 y.o. 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  She admitted to urinary urgency without frequency or dysuria or hematuria or flank pain.  No nausea or vomiting or abdominal pain.  No cough or wheezing or dyspnea.  No chest pain or palpitations.  No focal muscle weakness or paresthesias.  No bleeding diathesis.  ED Course: When the patient came to the ER vital signs were within normal.  Labs revealed normal CMP and mild anemia on her CBC.  Lactic acid was 1.5 and high sensitive troponin I was 3 and later 4.  Respiratory antigens came back negative with influenza A, B, RSV and COVID PCR.  Urinalysis was cloudy and a trace leukocytes with many bacteria and 0-5 WBCs.  EKG as reviewed by me : EKG showed likely normal sinus rhythm with a rate of 79 with right bundle branch block.  EKG was suboptimal though. Imaging: Portable chest x-ray revealed no acute cardiopulmonary disease.  The patient was given a gram of IV Rocephin and 1 L bolus of IV normal saline.  She will be admitted to a medical observation bed for further evaluation and management. 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   1 stent   Claustrophobia    Coronary artery disease    GERD (gastroesophageal reflux disease)     Hypercholesteremia    MI, old    Pneumonia    Small cell lung cancer (HCC)    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 (HCC) 10/27/2018   Streptococcal bacteremia 03/25/2022    PAST SURGICAL HISTORY:   Past Surgical History:  Procedure Laterality Date   APPENDECTOMY     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   BLADDER NECK SUSPENSION     CHOLECYSTECTOMY N/A 08/14/2018   Procedure: LAPAROSCOPIC CHOLECYSTECTOMY;  Surgeon: Leafy Ro, MD;  Location: [facility] ORS;  Service: General;  Laterality: N/A;   CHOLECYSTECTOMY  11/2018   CORONARY ANGIOPLASTY  09/29/2014   CORONARY STENT PLACEMENT     ENDOBRONCHIAL ULTRASOUND N/A 10/21/2018   Procedure: ENDOBRONCHIAL ULTRASOUND;  Surgeon: Shane Crutch, MD;  Location: [facility] ORS;  Service: Pulmonary;  Laterality: N/A;   HERNIA REPAIR  2012   IR FLUORO GUIDE CV LINE RIGHT  12/19/2018   KYPHOPLASTY N/A 03/01/2020   Procedure: T7 KYPHOPLASTY;  Surgeon: Kennedy Bucker, MD;  Location: [facility] ORS;  Service: Orthopedics;  Laterality: N/A;   LAPAROTOMY     LEFT HEART CATHETERIZATION WITH CORONARY ANGIOGRAM N/A 09/29/2014   Procedure: LEFT HEART CATHETERIZATION WITH CORONARY ANGIOGRAM;  Surgeon: Robynn Pane, MD;  Location: [facility] CATH LAB;  Service: Cardiovascular;  Laterality: N/A;   PORTA CATH INSERTION N/A 01/02/2019   Procedure: PORTA CATH INSERTION;  Surgeon: Annice Needy, MD;  Location: [facility] INVASIVE CV LAB;  Service: Cardiovascular;  Laterality: N/A;   PORTACATH PLACEMENT Right 10/28/2018   Procedure: INSERTION PORT-A-CATH;  Surgeon: Leafy Ro, MD;  Location: [facility] ORS;  Service: General;  Laterality: Right;   TEE WITHOUT CARDIOVERSION Right 03/30/2022   Procedure: TRANSESOPHAGEAL ECHOCARDIOGRAM (TEE);  Surgeon: Laurier Nancy, MD;  Location: [facility] ORS;  Service: Cardiovascular;  Laterality: Right;   XI ROBOTIC ASSISTED VENTRAL HERNIA N/A 02/27/2021   Procedure: XI ROBOTIC ASSISTED VENTRAL HERNIA (incisional) WITH MESH;  Surgeon: Henrene Dodge, MD;  Location: [facility]  ORS;  Service: General;  Laterality: N/A;    SOCIAL HISTORY:   Social History   Tobacco Use   Smoking status: Former    Current packs/day: 0.00    Average packs/day: 1 pack/day for 39.8 years (39.8 ttl pk-yrs)    Types: Cigarettes    Start date: 12/21/1978    Quit date: 10/16/2018    Years since quitting: 4.7   Smokeless tobacco: Never  Substance Use Topics   Alcohol use: Yes    Alcohol/week: 0.0 standard drinks of alcohol    Comment: occassional - approx 1 every 2 weeks     FAMILY HISTORY:   Family History  Problem Relation Age of Onset   Alzheimer's disease Mother    Colon cancer Father    Breast cancer Neg Hx     DRUG ALLERGIES:  No Known Allergies  REVIEW OF SYSTEMS:   ROS As per history of present illness. All pertinent systems were reviewed above. Constitutional, HEENT, cardiovascular, respiratory, GI, GU, musculoskeletal, neuro, psychiatric, endocrine, integumentary and hematologic systems were reviewed and are otherwise negative/unremarkable except for positive findings mentioned above in the HPI.   MEDICATIONS AT HOME:   Prior to Admission medications   Medication Sig Start Date End Date Taking? Authorizing Provider  acetaminophen (TYLENOL) 500 MG tablet Take 2 tablets (1,000 mg total) by mouth every 6 (six) hours as needed for mild pain or headache. 02/27/21   Henrene Dodge, MD  alum & mag hydroxide-simeth (MAALOX/MYLANTA) 200-200-20 MG/5ML suspension Take 30 mLs by mouth every 6 (six) hours as needed for indigestion or heartburn. 03/17/22   Hollice Espy, MD  apixaban (ELIQUIS) 2.5 MG TABS tablet Take 1 tablet (2.5 mg total) by mouth 2 (two) times daily. 05/10/23   Rickard Patience, MD  atorvastatin (LIPITOR) 40 MG tablet Take 1 tablet (40 mg total) by mouth daily. 04/02/23   Reubin Milan, MD  Calcium 600-200 MG-UNIT tablet Take 1 tablet by mouth 2 (two) times daily.    [provider]  Cholecalciferol (DIALYVITE VITAMIN D 5000) 125 MCG (5000 UT)  capsule Take 5,000 Units by mouth daily.    [provider]  diphenhydrAMINE (BENADRYL) 25 mg capsule Take 1 capsule (25 mg total) by mouth at bedtime as needed (if no sleep even with trazodone). 03/17/22   Hollice Espy, MD  lidocaine (LIDODERM) 5 % Place 1 patch onto the skin daily. Remove & Discard patch within 12 hours or as directed by MD 03/18/22   Hollice Espy, MD  Magnesium 250 MG TABS Take 250 mg by mouth daily.    [provider]  midodrine (PROAMATINE) 5 MG tablet Take 1 tablet (5 mg total) by mouth 3 (three) times daily with meals. 03/31/22   Hollice Espy, MD  oxyCODONE (OXY IR/ROXICODONE) 5 MG immediate release tablet Take 1 tablet (5 mg total) by mouth every 12 (twelve) hours as needed for severe pain or moderate pain. 03/05/23   Rickard Patience, MD  pantoprazole (PROTONIX)  40 MG tablet Take 1 tablet (40 mg total) by mouth 2 (two) times daily. Patient taking differently: Take 40 mg by mouth 2 (two) times daily as needed. 04/02/21   Rickard Patience, MD  polyethylene glycol (MIRALAX / GLYCOLAX) 17 g packet Take 17 g by mouth daily as needed for mild constipation. 03/17/22   Hollice Espy, MD  prochlorperazine (COMPAZINE) 10 MG tablet Take 10 mg by mouth every 6 (six) hours as needed for nausea or vomiting.    [provider]  sertraline (ZOLOFT) 100 MG tablet Take 1 tablet (100 mg total) by mouth daily. 05/21/22   Borders, Daryl Eastern, NP  sucralfate (CARAFATE) 1 g tablet Take 1 tablet (1 g total) by mouth 3 (three) times daily as needed (heartburn). 03/05/23   Rickard Patience, MD  traZODone (DESYREL) 50 MG tablet Take 1 tablet (50 mg total) by mouth at bedtime as needed for sleep. 03/31/21   Borders, Daryl Eastern, NP  vitamin B-12 (CYANOCOBALAMIN) 1000 MCG tablet Take 1,000 mcg by mouth daily.    [provider]      VITAL SIGNS:  Blood pressure 117/77, pulse 82, temperature 97.8 F (36.6 C), temperature source Oral, resp. rate 20, height 5\' 7"  (1.702 m), weight  63.8 kg, SpO2 99%.  PHYSICAL EXAMINATION:  Physical Exam  GENERAL:  77 y.o.-year-old Caucasian female patient lying in the bed with no acute distress.  EYES: Pupils equal, round, reactive to light and accommodation. No scleral icterus. Extraocular muscles intact.  HEENT: Head atraumatic, normocephalic. Oropharynx and nasopharynx clear.  NECK:  Supple, no jugular venous distention. No thyroid enlargement, no tenderness.  LUNGS: Normal breath sounds bilaterally, no wheezing, rales,rhonchi or crepitation. No use of accessory muscles of respiration.  CARDIOVASCULAR: Regular rate and rhythm, S1, S2 normal. No murmurs, rubs, or gallops.  ABDOMEN: Soft, nondistended, nontender. Bowel sounds present. No organomegaly or mass.  EXTREMITIES: No pedal edema, cyanosis, or clubbing.  NEUROLOGIC: Cranial nerves II through XII are intact. Muscle strength 5/5 in all extremities. Sensation intact. Gait not checked.  PSYCHIATRIC: The patient is alert and oriented x 3.  Normal affect and good eye contact. SKIN: No obvious rash, lesion, or ulcer.   LABORATORY PANEL:   CBC Recent Labs  Lab 07/12/23 1343  WBC 6.1  HGB 11.8*  HCT 35.6*  PLT 183   ------------------------------------------------------------------------------------------------------------------  Chemistries  Recent Labs  Lab 07/12/23 1343  NA 136  K 3.8  CL 107  CO2 22  GLUCOSE 95  BUN 9  CREATININE 0.87  CALCIUM 8.6*  MG 2.0  AST 15  ALT 9  ALKPHOS 39  BILITOT 0.6   ------------------------------------------------------------------------------------------------------------------  Cardiac Enzymes No results for input(s): "TROPONINI" in the last 168 hours. ------------------------------------------------------------------------------------------------------------------  RADIOLOGY:  DG Chest Port 1 View  Result Date: 07/12/2023 CLINICAL DATA:  Possible sepsis. EXAM: PORTABLE CHEST 1 VIEW COMPARISON:  09/05/2022; chest  CT-05/05/2023 FINDINGS: Unchanged cardiac silhouette and mediastinal contours. Stable positioning of support apparatus. No focal airspace opacities. Redemonstrated punctate granuloma overlying the right lower lung. No pleural effusion or pneumothorax. No evidence of edema. No acute osseous abnormalities. IMPRESSION: No acute cardiopulmonary disease. Specifically, no evidence of pneumonia. Electronically Signed   By: Simonne Come M.D.   On: 07/12/2023 15:28   CT Head Wo Contrast  Result Date: 07/12/2023 CLINICAL DATA:  Provided history: Mental status change, unknown cause. Headache, new onset. Lethargy. Confusion. Weakness. EXAM: CT HEAD WITHOUT CONTRAST TECHNIQUE: Contiguous axial images were obtained from the base of the skull  through the vertex without intravenous contrast. RADIATION DOSE REDUCTION: This exam was performed according to the departmental dose-optimization program which includes automated exposure control, adjustment of the mA and/or kV according to patient size and/or use of iterative reconstruction technique. COMPARISON:  Brain MRI 03/10/2023.  Non-contrast head CT 03/12/2022. FINDINGS: Brain: Moderate generalized cerebral atrophy. Advanced patchy and confluent hypoattenuation within the cerebral white matter, likely reflecting a combination of chronic small vessel ischemia and sequelae of prior radiation. Chronic small vessel ischemic disease within the bilateral deep gray nuclei and within the pons. 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 There is no acute intracranial hemorrhage. No demarcated cortical infarct. No extra-axial fluid collection. No evidence of an intracranial mass. No midline shift. Vascular: No hyperdense vessel.  Atherosclerotic calcifications. Skull: No calvarial fracture or aggressive osseous lesion. Sinuses/Orbits: No mass or acute finding within the imaged orbits. Minimal mucosal thickening scattered within bilateral ethmoid air  cells. Mild mucosal thickening within the right sphenoid sinus. Minimal mucosal thickening within the left sphenoid sinus. IMPRESSION: 1. No evidence of an acute intracranial abnormality. 2. Advanced cerebral white matter disease, likely reflecting a combination of chronic small vessel ischemia and sequelae of prior radiation. 3. Chronic small ischemic changes within the bilateral deep gray nuclei and within the pons. 4. 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 5. Generalized cerebral atrophy. 6. Paranasal sinus disease at the imaged levels. Electronically Signed   By: Jackey Loge D.O.   On: 07/12/2023 15:05      IMPRESSION AND PLAN:  Assessment and Plan: * Failure to thrive in adult - The patient will be admitted to a medical observation bed. - This associated with generalized weakness and fatigue. - We will continue hydration with IV normal saline. - We will obtain a physical therapy consult. - We will check 2 blood cultures as she had similar presentation with similar symptoms in April of last year and was bacteremic..  Bacteriuria - We will continue IV Rocephin and follow urine culture and sensitivity.  Dyslipidemia - We will continue statin therapy.  GERD without esophagitis - We will continue PPI therapy as well as Carafate.  Depression - We will continue Zoloft.  History of pulmonary embolism - We will continue Eliquis.   DVT prophylaxis: Lovenox.  Advanced Care Planning:  Code Status: The patient is DNR and DNI. Family Communication:  The plan of care was discussed in details with the patient (and family). I answered all questions. The patient agreed to proceed with the above mentioned plan. Further management will depend upon hospital course. Disposition Plan: Back to previous home environment Consults called: none.  All the records are reviewed and case discussed with ED provider.  Status is: Observation  I certify that at  the time of admission, it is my clinical judgment that the patient will require hospital care extending less than 2 midnights.                            Dispo: The patient is from: Home              Anticipated d/c is to: Home              Patient currently is not medically stable to d/c.              Difficult to place patient: No  Hannah Beat M.D on 07/12/2023 at 5:57 PM  [facility]  From 7 PM-7 AM, contact night-coverage [email or web address]  CC: Primary care physician; Reubin Milan, MD

## 2023-07-12 NOTE — Assessment & Plan Note (Addendum)
-   The patient will be admitted to a medical observation bed. - This associated with generalized weakness and fatigue. - We will continue hydration with IV normal saline. - We will obtain a physical therapy consult. - We will check 2 blood cultures as she had similar presentation with similar symptoms in April of last year and was bacteremic.[name] Kitchen

## 2023-07-12 NOTE — ED Triage Notes (Signed)
Patient to ED via [facility] for altered mental status. Patient has had increased lethargy, confusion, and weakness since yesterdat. LKW was 10 PM yesterday. Patient is A&Ox4;.

## 2023-07-12 NOTE — Assessment & Plan Note (Signed)
-   We will continue Zoloft 

## 2023-07-13 ENCOUNTER — Inpatient Hospital Stay: Payer: Medicare Other

## 2023-07-13 DIAGNOSIS — B9620 Unspecified Escherichia coli [E. coli] as the cause of diseases classified elsewhere: Secondary | ICD-10-CM | POA: Diagnosis present

## 2023-07-13 DIAGNOSIS — Z66 Do not resuscitate: Secondary | ICD-10-CM | POA: Diagnosis present

## 2023-07-13 DIAGNOSIS — Z9221 Personal history of antineoplastic chemotherapy: Secondary | ICD-10-CM | POA: Diagnosis not present

## 2023-07-13 DIAGNOSIS — I4510 Unspecified right bundle-branch block: Secondary | ICD-10-CM | POA: Diagnosis present

## 2023-07-13 DIAGNOSIS — K219 Gastro-esophageal reflux disease without esophagitis: Secondary | ICD-10-CM | POA: Diagnosis present

## 2023-07-13 DIAGNOSIS — E7800 Pure hypercholesterolemia, unspecified: Secondary | ICD-10-CM | POA: Diagnosis present

## 2023-07-13 DIAGNOSIS — R627 Adult failure to thrive: Secondary | ICD-10-CM | POA: Diagnosis present

## 2023-07-13 DIAGNOSIS — Z79899 Other long term (current) drug therapy: Secondary | ICD-10-CM | POA: Diagnosis not present

## 2023-07-13 DIAGNOSIS — E860 Dehydration: Secondary | ICD-10-CM | POA: Diagnosis present

## 2023-07-13 DIAGNOSIS — Z86711 Personal history of pulmonary embolism: Secondary | ICD-10-CM | POA: Diagnosis not present

## 2023-07-13 DIAGNOSIS — I252 Old myocardial infarction: Secondary | ICD-10-CM | POA: Diagnosis not present

## 2023-07-13 DIAGNOSIS — Z85118 Personal history of other malignant neoplasm of bronchus and lung: Secondary | ICD-10-CM | POA: Diagnosis not present

## 2023-07-13 DIAGNOSIS — Z6822 Body mass index (BMI) 22.0-22.9, adult: Secondary | ICD-10-CM | POA: Diagnosis not present

## 2023-07-13 DIAGNOSIS — N390 Urinary tract infection, site not specified: Principal | ICD-10-CM

## 2023-07-13 DIAGNOSIS — Z7901 Long term (current) use of anticoagulants: Secondary | ICD-10-CM | POA: Diagnosis not present

## 2023-07-13 DIAGNOSIS — I2510 Atherosclerotic heart disease of native coronary artery without angina pectoris: Secondary | ICD-10-CM | POA: Diagnosis present

## 2023-07-13 DIAGNOSIS — Z87891 Personal history of nicotine dependence: Secondary | ICD-10-CM | POA: Diagnosis not present

## 2023-07-13 DIAGNOSIS — Z955 Presence of coronary angioplasty implant and graft: Secondary | ICD-10-CM | POA: Diagnosis not present

## 2023-07-13 DIAGNOSIS — F32A Depression, unspecified: Secondary | ICD-10-CM | POA: Diagnosis present

## 2023-07-13 DIAGNOSIS — Z800 Family history of malignant neoplasm of digestive organs: Secondary | ICD-10-CM | POA: Diagnosis not present

## 2023-07-13 DIAGNOSIS — Z1152 Encounter for screening for COVID-19: Secondary | ICD-10-CM | POA: Diagnosis not present

## 2023-07-13 DIAGNOSIS — Z820 Family history of epilepsy and other diseases of the nervous system: Secondary | ICD-10-CM | POA: Diagnosis not present

## 2023-07-13 DIAGNOSIS — R531 Weakness: Secondary | ICD-10-CM | POA: Diagnosis not present

## 2023-07-13 LAB — CBC
HCT: 36 % (ref 36.0–46.0)
Hemoglobin: 12.4 g/dL (ref 12.0–15.0)
MCH: 29.4 pg (ref 26.0–34.0)
MCHC: 34.4 g/dL (ref 30.0–36.0)
MCV: 85.3 fL (ref 80.0–100.0)
Platelets: 179 10*3/uL (ref 150–400)
RBC: 4.22 MIL/uL (ref 3.87–5.11)
RDW: 13.8 % (ref 11.5–15.5)
WBC: 5.8 10*3/uL (ref 4.0–10.5)
nRBC: 0 % (ref 0.0–0.2)

## 2023-07-13 LAB — BASIC METABOLIC PANEL
Anion gap: 8 (ref 5–15)
BUN: 9 mg/dL (ref 8–23)
CO2: 21 mmol/L — ABNORMAL LOW (ref 22–32)
Calcium: 8.7 mg/dL — ABNORMAL LOW (ref 8.9–10.3)
Chloride: 109 mmol/L (ref 98–111)
Creatinine, Ser: 0.68 mg/dL (ref 0.44–1.00)
GFR, Estimated: >60 mL/min (ref >60)
Glucose, Bld: 92 mg/dL (ref 70–99)
Potassium: 3.6 mmol/L (ref 3.5–5.1)
Sodium: 138 mmol/L (ref 135–145)

## 2023-07-13 LAB — CULTURE, BLOOD (ROUTINE X 2): Culture: NO GROWTH

## 2023-07-13 LAB — URINE CULTURE: Culture: >=100000 — AB

## 2023-07-13 MED ORDER — CHLORHEXIDINE GLUCONATE CLOTH 2 % EX PADS
6.0000 | MEDICATED_PAD | Freq: Every day | CUTANEOUS | Status: DC
  Administered 2023-07-13 – 2023-07-14 (×2): 6 via TOPICAL

## 2023-07-13 MED ORDER — SODIUM CHLORIDE 0.9% FLUSH: 10.0000 mL | INTRAVENOUS | Status: DC | PRN

## 2023-07-13 MED ORDER — CEPHALEXIN 500 MG PO CAPS
500.0000 mg | ORAL_CAPSULE | Freq: Two times a day (BID) | ORAL | Status: DC
  Administered 2023-07-14: 500 mg via ORAL
  Filled 2023-07-13: qty 1

## 2023-07-13 MED ORDER — SODIUM CHLORIDE 0.9% FLUSH
10.0000 mL | Freq: Two times a day (BID) | INTRAVENOUS | Status: DC
  Administered 2023-07-13 – 2023-07-14 (×2): 10 mL

## 2023-07-13 NOTE — Plan of Care (Signed)

## 2023-07-13 NOTE — Progress Notes (Addendum)
Progress Note    Kristi [name] [facility]  ZOX:[id number] DOB: 03/23/1946  DOA: 07/12/2023 PCP: Reubin Milan, MD      Brief Narrative:    Medical records reviewed and are as summarized below:  Kristi [name] is a 77 y.o. 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  She also complained of unsteady gait, general weakness, increasing urinary urgency but has no frequency of micturition, dysuria, abdominal pain or hematuria.  Urinalysis was abnormal (bacteriuria) concerning for acute UTI.  She was started on IV ceftriaxone for probable acute UTI.    Assessment/Plan:   Principal Problem:   E. coli UTI Active Problems:   Failure to thrive in adult   Bacteriuria   Dyslipidemia   GERD without esophagitis   History of pulmonary embolism   Depression   Generalized weakness   FTT (failure to thrive) in adult    Body mass index is 22.03 kg/m.   Acute UTI: Continue IV ceftriaxone.  Urine culture showed E. coli.  Follow-up urine culture sensitivity report.  No growth on blood cultures thus far.  Discontinue IV fluids.   Unsteady gait, general weakness: PT and OT evaluation   Other comorbidities include CAD, dyslipidemia, GERD, depression, history of pulmonary embolism on Eliquis   Diet Order             Diet Heart Room service appropriate? Yes; Fluid consistency: Thin  Diet effective now                            Consultants: None  Procedures: None                                                          Medications:    apixaban  2.5 mg Oral BID   calcium-vitamin D  1 tablet Oral BID WC   [START ON 07/14/2023] cephALEXin  500 mg Oral Q12H   Chlorhexidine Gluconate Cloth  6 each Topical Daily   cholecalciferol  2,000 Units Oral Daily   cyanocobalamin  1,000 mcg  Oral Daily   feeding supplement  237 mL Oral BID BM   lidocaine  1 patch Transdermal Q24H   magnesium oxide  200 mg Oral Daily   sertraline  100 mg Oral Daily   sodium chloride flush  10-40 mL Intracatheter Q12H   Continuous Infusions:  cefTRIAXone (ROCEPHIN)  IV       Anti-infectives (From admission, onward)    Start     Dose/Rate Route Frequency Ordered Stop   07/14/23 1000  cephALEXin (KEFLEX) capsule 500 mg        500 mg Oral Every 12 hours 07/13/23 1211 07/17/23 0959   07/13/23 1400  cefTRIAXone (ROCEPHIN) 1 g in sodium chloride 0.9 % 100 mL IVPB        1 g 200 mL/hr over 30 Minutes Intravenous Every 24 hours 07/12/23 1759 07/13/23 2359   07/12/23 1500  cefTRIAXone (ROCEPHIN) 1 g in sodium chloride 0.9 % 100 mL IVPB        1 g 200 mL/hr over 30 Minutes Intravenous  Once 07/12/23 1449 07/12/23  1529              Family Communication/Anticipated D/C date and plan/Code Status   DVT prophylaxis: apixaban (ELIQUIS) tablet 2.5 mg Start: 07/12/23 2215 apixaban (ELIQUIS) tablet 2.5 mg     Code Status: DNR  Family Communication: Plan discussed with her husband at the bedside Disposition Plan: Plan to discharge home with home health therapy   Status is: Observation The patient will require care spanning > 2 midnights and should be moved to inpatient because: Acute E. coli UTI on IV antibiotics.  Follow-up culture results       Subjective:   Interval events noted.  She complains of urinary urgency.  She feels a little stronger this morning.  No fever, abdominal pain, vomiting, increased frequency of micturition.  Her husband was at the bedside.  Objective:    Vitals:   07/12/23 1814 07/12/23 1947 07/13/23 0432 07/13/23 0847  BP: 91/63 120/75 121/88 109/81  Pulse: 77 76 76 75  Resp: 18 18 18 18   Temp: 97.9 F (36.6 C) 97.7 F (36.5 C) 97.8 F (36.6 C) (!) 97.5 F (36.4 C)  TempSrc: Oral Oral Oral   SpO2: 98% 99% 96% 100%  Weight:      Height:        No data found.   Intake/Output Summary (Last 24 hours) at 07/13/2023 1316 Last data filed at 07/12/2023 1529 Gross per 24 hour  Intake 1000 ml  Output --  Net 1000 ml   Filed Weights   07/12/23 1459  Weight: 63.8 kg    Exam:  GEN: NAD SKIN: No rash EYES: EOMI ENT: MMM CV: RRR PULM: CTA B ABD: soft, ND, NT, +BS CNS: AAO x  2 (person and place), non focal EXT: No edema or tenderness        Data Reviewed:   I have personally reviewed following labs and imaging studies:  Labs: Labs show the following:   Basic Metabolic Panel: Recent Labs  Lab 07/12/23 1343 07/13/23 0452  NA 136 138  K 3.8 3.6  CL 107 109  CO2 22 21*  GLUCOSE 95 92  BUN 9 9  CREATININE 0.87 0.68  CALCIUM 8.6* 8.7*  MG 2.0  --    GFR Estimated Creatinine Clearance: 58.2 mL/min (by C-G formula based on SCr of 0.68 mg/dL). Liver Function Tests: Recent Labs  Lab 07/12/23 1343  AST 15  ALT 9  ALKPHOS 39  BILITOT 0.6  PROT 6.6  ALBUMIN 3.6   No results for input(s): "LIPASE", "AMYLASE" in the last 168 hours. No results for input(s): "AMMONIA" in the last 168 hours. Coagulation profile No results for input(s): "INR", "PROTIME" in the last 168 hours.  CBC: Recent Labs  Lab 07/12/23 1343 07/13/23 0452  WBC 6.1 5.8  NEUTROABS 3.8  --   HGB 11.8* 12.4  HCT 35.6* 36.0  MCV 86.4 85.3  PLT 183 179   Cardiac Enzymes: No results for input(s): "CKTOTAL", "CKMB", "CKMBINDEX", "TROPONINI" in the last 168 hours. BNP (last 3 results) No results for input(s): "PROBNP" in the last 8760 hours. CBG: No results for input(s): "GLUCAP" in the last 168 hours. D-Dimer: No results for input(s): "DDIMER" in the last 72 hours. Hgb A1c: No results for input(s): "HGBA1C" in the last 72 hours. Lipid Profile: No results for input(s): "CHOL", "HDL", "LDLCALC", "TRIG", "CHOLHDL", "LDLDIRECT" in the last 72 hours. Thyroid function studies: Recent Labs    07/12/23 1343  TSH 0.732   Anemia  work up:  No results for input(s): "VITAMINB12", "FOLATE", "FERRITIN", "TIBC", "IRON", "RETICCTPCT" in the last 72 hours. Sepsis Labs: Recent Labs  Lab 07/12/23 1343 07/12/23 1403 07/12/23 1957 07/13/23 0452  WBC 6.1  --   --  5.8  LATICACIDVEN  --  1.5 1.6  --     Microbiology Recent Results (from the past 240 hour(s))  Resp panel by RT-PCR (RSV, Flu A&B, Covid) Anterior Nasal Swab     Status: None   Collection Time: 07/12/23  1:44 PM   Specimen: Anterior Nasal Swab  Result Value Ref Range Status   SARS Coronavirus 2 by RT PCR NEGATIVE NEGATIVE Final    Comment: (NOTE) SARS-CoV-2 target nucleic acids are NOT DETECTED.  The SARS-CoV-2 RNA is generally detectable in upper respiratory specimens during the acute phase of infection. The lowest concentration of SARS-CoV-2 viral copies this assay can detect is 138 copies/mL. A negative result does not preclude SARS-Cov-2 infection and should not be used as the sole basis for treatment or other patient management decisions. A negative result may occur with  improper specimen collection/handling, submission of specimen other than nasopharyngeal swab, presence of viral mutation(s) within the areas targeted by this assay, and inadequate number of viral copies(<138 copies/mL). A negative result must be combined with clinical observations, patient history, and epidemiological information. The expected result is Negative.  Fact Sheet for Patients:  BloggerCourse.com  Fact Sheet for Healthcare Providers:  SeriousBroker.it  This test is no t yet approved or cleared by the [place] FDA and  has been authorized for detection and/or diagnosis of SARS-CoV-2 by FDA under an Emergency Use Authorization (EUA). This EUA will remain  in effect (meaning this test can be used) for the duration of the COVID-19 declaration under Section 564(b)(1) of the Act, 21 U.S.C.section 360bbb-3(b)(1),  unless the authorization is terminated  or revoked sooner.       Influenza A by PCR NEGATIVE NEGATIVE Final   Influenza B by PCR NEGATIVE NEGATIVE Final    Comment: (NOTE) The Xpert Xpress SARS-CoV-2/FLU/RSV plus assay is intended as an aid in the diagnosis of influenza from Nasopharyngeal swab specimens and should not be used as a sole basis for treatment. Nasal washings and aspirates are unacceptable for Xpert Xpress SARS-CoV-2/FLU/RSV testing.  Fact Sheet for Patients: BloggerCourse.com  Fact Sheet for Healthcare Providers: SeriousBroker.it  This test is not yet approved or cleared by the [place] FDA and has been authorized for detection and/or diagnosis of SARS-CoV-2 by FDA under an Emergency Use Authorization (EUA). This EUA will remain in effect (meaning this test can be used) for the duration of the COVID-19 declaration under Section 564(b)(1) of the Act, 21 U.S.C. section 360bbb-3(b)(1), unless the authorization is terminated or revoked.     Resp Syncytial Virus by PCR NEGATIVE NEGATIVE Final    Comment: (NOTE) Fact Sheet for Patients: BloggerCourse.com  Fact Sheet for Healthcare Providers: SeriousBroker.it  This test is not yet approved or cleared by the [place] FDA and has been authorized for detection and/or diagnosis of SARS-CoV-2 by FDA under an Emergency Use Authorization (EUA). This EUA will remain in effect (meaning this test can be used) for the duration of the COVID-19 declaration under Section 564(b)(1) of the Act, 21 U.S.C. section 360bbb-3(b)(1), unless the authorization is terminated or revoked.  Performed at [facility], [address], [place], [place] [address]   Urine Culture (for pregnant, neutropenic or urologic patients or patients with an indwelling urinary catheter)     Status:  Abnormal (Preliminary result)    Collection Time: 07/12/23  2:03 PM   Specimen: Urine, Clean Catch  Result Value Ref Range Status   Specimen Description   Final    URINE, CLEAN CATCH Performed at [facility], [address], [place], [place] [address]    Special Requests   Final    NONE Performed at [facility], [address] [place], [place] [address]    Culture >=100,000 COLONIES/mL ESCHERICHIA COLI (A)  Final   Report Status PENDING  Incomplete  Culture, blood (Routine X 2) w Reflex to ID Panel     Status: None (Preliminary result)   Collection Time: 07/12/23  7:43 PM   Specimen: BLOOD  Result Value Ref Range Status   Specimen Description BLOOD BLOOD LEFT HAND  Final   Special Requests   Final    BOTTLES DRAWN AEROBIC AND ANAEROBIC Blood Culture results may not be optimal due to an inadequate volume of blood received in culture bottles   Culture   Final    NO GROWTH < 12 HOURS Performed at [facility], [address], [place], [place] [address]    Report Status PENDING  Incomplete  Culture, blood (Routine X 2) w Reflex to ID Panel     Status: None (Preliminary result)   Collection Time: 07/12/23  8:03 PM   Specimen: BLOOD  Result Value Ref Range Status   Specimen Description BLOOD RIGHT ANTECUBITAL  Final   Special Requests   Final    BOTTLES DRAWN AEROBIC AND ANAEROBIC Blood Culture results may not be optimal due to an inadequate volume of blood received in culture bottles   Culture   Final    NO GROWTH < 12 HOURS Performed at [facility], [address], [place], [place] [address]    Report Status PENDING  Incomplete    Procedures and diagnostic studies:  DG Chest Port 1 View  Result Date: 07/12/2023 CLINICAL DATA:  Possible sepsis. EXAM: PORTABLE CHEST 1 VIEW COMPARISON:  09/05/2022; chest CT-05/05/2023 FINDINGS: Unchanged cardiac silhouette and mediastinal contours. Stable positioning of support apparatus. No focal airspace opacities. Redemonstrated  punctate granuloma overlying the right lower lung. No pleural effusion or pneumothorax. No evidence of edema. No acute osseous abnormalities. IMPRESSION: No acute cardiopulmonary disease. Specifically, no evidence of pneumonia. Electronically Signed   By: Simonne Come M.D.   On: 07/12/2023 15:28   CT Head Wo Contrast  Result Date: 07/12/2023 CLINICAL DATA:  Provided history: Mental status change, unknown cause. Headache, new onset. Lethargy. Confusion. Weakness. EXAM: CT HEAD WITHOUT CONTRAST TECHNIQUE: Contiguous axial images were obtained from the base of the skull through the vertex without intravenous contrast. RADIATION DOSE REDUCTION: This exam was performed according to the departmental dose-optimization program which includes automated exposure control, adjustment of the mA and/or kV according to patient size and/or use of iterative reconstruction technique. COMPARISON:  Brain MRI 03/10/2023.  Non-contrast head CT 03/12/2022. FINDINGS: Brain: Moderate generalized cerebral atrophy. Advanced patchy and confluent hypoattenuation within the cerebral white matter, likely reflecting a combination of chronic small vessel ischemia and sequelae of prior radiation. Chronic small vessel ischemic disease within the bilateral deep gray nuclei and within the pons. 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 There is no acute intracranial hemorrhage. No demarcated cortical infarct. No extra-axial fluid collection. No evidence of an intracranial mass. No midline shift. Vascular: No hyperdense vessel.  Atherosclerotic calcifications. Skull: No calvarial fracture or aggressive osseous lesion. Sinuses/Orbits: No mass  or acute finding within the imaged orbits. Minimal mucosal thickening scattered within bilateral ethmoid air cells. Mild mucosal thickening within the right sphenoid sinus. Minimal mucosal thickening within the left sphenoid sinus. IMPRESSION: 1. No evidence of an acute  intracranial abnormality. 2. Advanced cerebral white matter disease, likely reflecting a combination of chronic small vessel ischemia and sequelae of prior radiation. 3. Chronic small ischemic changes within the bilateral deep gray nuclei and within the pons. 4. 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 5. Generalized cerebral atrophy. 6. Paranasal sinus disease at the imaged levels. Electronically Signed   By: Jackey Loge D.O.   On: 07/12/2023 15:05               LOS: 0 days   Marshel Golubski  [facility]   Pager on [email or web address]. If 7PM-7AM, please contact night-coverage at [email or web address]     07/13/2023, 1:16 PM

## 2023-07-13 NOTE — Evaluation (Addendum)
Physical Therapy Evaluation Patient Details Name: Kristi [name] MRN: [id number] DOB: December 07, 1946 Today's Date: 07/13/2023  History of Present Illness  Kristi [name] is a 76yoF who [facility] c AMS. PMH: falls, CAD, small cell lungCA s/p chemo (in remission). Per family pt with generalized fatigue over a couple weeks. Pt admitted under PTT observations status.  Clinical Impression  Pt in bed on entry, agreeable to session. Pt is not especially good historian despite orientation status, struggles to give good detail on home setup, functional baseline, level of assistance from husband. Pt requires minA to get to EOB despite heavy effort, minA to stand from EOB and modA to rise from commode. Pt struggles with problem solving and variable use of RW when high effort is needed for transfers. AMB does not appear especially safe, but is made less safe by questionable judgment in use of RW, which she perceived to be more trouble than worth. AMB is limited to in-room distances today, albeit appears not far from predicted baseline based off her comfortable appearance and confidence in >10 minutes activity. Will try to obtain more details from family via telephone, but likely great benefit in extensive rehab services prior to DC.         Assistance Recommended at Discharge Intermittent Supervision/Assistance  If plan is discharge home, recommend the following:  Can travel by private vehicle  A little help with walking and/or transfers;A little help with bathing/dressing/bathroom;Help with stairs or ramp for entrance;Assist for transportation;Assistance with cooking/housework   No    Equipment Recommendations None recommended by PT  Recommendations for Other Services       Functional Status Assessment Patient has had a recent decline in their functional status and demonstrates the ability to make significant improvements in function in a reasonable and predictable amount of time.      Precautions / Restrictions Precautions Precautions: Fall Restrictions Weight Bearing Restrictions: No      Mobility  Bed Mobility Overal bed mobility: Needs Assistance Bed Mobility: Supine to Sit, Sit to Supine     Supine to sit: Min assist Sit to supine: Mod assist        Transfers Overall transfer level: Needs assistance Equipment used: Rolling walker (2 wheels) Transfers: Sit to/from Stand Sit to Stand: Min assist           General transfer comment: poor problem solving best technique    Ambulation/Gait Ambulation/Gait assistance: Min guard Gait Distance (Feet): 60 Feet Assistive device: Rolling walker (2 wheels)   Gait velocity: 2 minutes to walk from EOB to doorway and back.     General Gait Details: distracted at times, stops frequently, questionable safe use of RW, need cues to attend to use of RW due to finding it easier to hold onto fixed items in room.  Stairs            Wheelchair Mobility     Tilt Bed    Modified Rankin (Stroke Patients Only)       Balance Overall balance assessment: Needs assistance   Sitting balance-Leahy Scale: Fair       Standing balance-Leahy Scale: Fair                               Pertinent Vitals/Pain Pain Assessment Pain Assessment: No/denies pain    Home Living Family/patient expects to be discharged to:: Private residence Living Arrangements: Spouse/significant other Available Help at Discharge: Family;Other (Comment);Available 24  hours/day (husband unable to phsyically get her off floor, they call other family to help.) Type of Home: Apartment Home Access: Level entry       Home Layout: One level Home Equipment: Rollator (4 wheels);Grab bars - toilet;Wheelchair - Manufacturing systems engineer;Toilet riser      Prior Function Prior Level of Function : Needs assist;Independent/Modified Independent             Mobility Comments: still falling regularly, but       Hand Dominance         Extremity/Trunk Assessment                Communication      Cognition Arousal/Alertness: Awake/alert Behavior During Therapy: WFL for tasks assessed/performed Overall Cognitive Status: Within Functional Limits for tasks assessed                                          General Comments      Exercises Other Exercises Other Exercises: AMB to BR, toilet transfers, self pericare, balance/RW management during hand hygiene   Assessment/Plan    PT Assessment Patient needs continued PT services  PT Problem List Decreased strength;Decreased cognition;Decreased activity tolerance;Decreased balance;Decreased mobility;Decreased knowledge of use of DME       PT Treatment Interventions DME instruction;Gait training;Stair training;Functional mobility training;Therapeutic exercise;Therapeutic activities;Balance training;Cognitive remediation;Patient/family education;Neuromuscular re-education    PT Goals (Current goals can be found in the Care Plan section)  Acute Rehab PT Goals Patient Stated Goal: wants to go home PT Goal Formulation: With patient Time For Goal Achievement: 07/27/23 Potential to Achieve Goals: Fair    Frequency Min 1X/week     Co-evaluation               AM-PAC PT "6 Clicks" Mobility  Outcome Measure Help needed turning from your back to your side while in a flat bed without using bedrails?: A Lot Help needed moving from lying on your back to sitting on the side of a flat bed without using bedrails?: A Lot Help needed moving to and from a bed to a chair (including a wheelchair)?: A Lot Help needed standing up from a chair using your arms (e.g., wheelchair or bedside chair)?: A Lot Help needed to walk in hospital room?: A Lot Help needed climbing 3-5 steps with a railing? : A Lot 6 Click Score: 12    End of Session Equipment Utilized During Treatment: Gait belt Activity Tolerance: Patient tolerated treatment well;No  increased pain Patient left: in bed;with call bell/phone within reach;with bed alarm set   PT Visit Diagnosis: Difficulty in walking, not elsewhere classified (R26.2);Unsteadiness on feet (R26.81);Other abnormalities of gait and mobility (R26.89);Muscle weakness (generalized) (M62.81)    Time: 4098-1191 PT Time Calculation (min) (ACUTE ONLY): 26 min   Charges:   PT Evaluation $PT Eval Moderate Complexity: 1 Mod PT Treatments $Therapeutic Activity: 8-22 mins PT General Charges $$ ACUTE PT VISIT: 1 Visit        2:26 PM, 07/13/23 Rosamaria Lints, PT, DPT Physical Therapist - [facility]  [phone] (ASCOM)    Wanell Lorenzi C 07/13/2023, 2:26 PM

## 2023-07-13 NOTE — TOC Progression Note (Signed)
Transition of Care [facility]) - Progression Note    Patient Details  Name: Kristi [name] MRN: [id number] Date of Birth: 1946-04-20  Transition [facility]) CM/SW Contact  Garret Reddish, RN Phone Number: 07/13/2023, 3:26 PM  Clinical Narrative:   Chart reviewed.  Noted that patient was admitted with Failure to Thrive and E. coli UTI.  Patient is currently on IV Ceftriaxone.    I have meet with Mrs. Doke and her husband at bedside today.  Mr. Cantrelle reports that prior to admission patient lived at home with him.  Mr. Theodis Blaze reports prior to admission he assist patient with bathing and dressing. Mr. Rooks reports that patient was able to get around with a walker in the home. Mr. Gin reports that patient has had home health before but he did not feel like they did a lot.  Mr. Rottman also reports that patient has been to [facility] before.  He reports that their was not very good support at night at the facility and he would prefer to take his wife home on discharge. I did make Mrs. Boursiquot and Mr. [facility] aware that patient PT and OT will evaluation patient and make recommendation.    I have spoken to Mrs. And Mr. Romulus about SNF v/s home health.  Mr. Ealey did not have a home heallth preference. Mr. Rockwood reports that he would take Mrs. Coppock to appointments.    TOC will continue to follow for discharge planning.      Expected Discharge Plan:  (Pending Medical work up) Barriers to Discharge: No Barriers Identified  Expected Discharge Plan and Services   Discharge Planning Services: CM Consult Post Acute Care Choice: Home Health (Home health v/s SNF) Living arrangements for the past 2 months: Single Family Home                                       Social Determinants of Health (SDOH) Interventions SDOH Screenings   Food Insecurity: Patient Declined (07/12/2023)  Housing: Patient Declined (07/12/2023)  Transportation Needs: Patient Declined (07/12/2023)   Utilities: Not At Risk (07/12/2023)  Alcohol Screen: Low Risk  (03/26/2023)  Depression (PHQ2-9): High Risk (03/26/2023)  Financial Resource Strain: Low Risk  (03/26/2023)  Physical Activity: Inactive (03/26/2023)  Social Connections: Unknown (01/02/2019)  Stress: No Stress Concern Present (03/26/2023)  Tobacco Use: Medium Risk (07/12/2023)    Readmission Risk Interventions    03/27/2022    2:30 PM  Readmission Risk Prevention Plan  Transportation Screening Complete  PCP or Specialist Appt within 3-5 Days Complete  Social Work Consult for Recovery Care Planning/Counseling Complete  Palliative Care Screening Not Applicable  Medication Review Oceanographer) Complete

## 2023-07-13 NOTE — Evaluation (Signed)
Occupational Therapy Evaluation Patient Details Name: Kristi [name] MRN: [id number] DOB: [birth year] Today's Date: 07/13/2023   History of Present Illness Kristi [name] is a 76yoF who comes to [facility] on 07/12/23 c AMS. PMH: falls, CAD, small cell lungCA s/p chemo (in remission). Per family pt with generalized fatigue over a couple weeks. Pt admitted under observations status.   Clinical Impression   Pt was seen for OT evaluation this date. Prior to hospital admission, pt was requiring CGA for mobility with rollator. MIN A for transfers. Pt lives husband in a one level home that is accessible with RW. Pt presents to acute OT demonstrating impaired ADL performance and functional mobility 2/2 generalized weakness and unsteadiness (See OT problem list for additional functional deficits). Pt currently requires MIN A for transfers with RW. MIN A for ambulation with VC for sequencing. MIN A for ADL tasks at sink side. MAX A to return to bed demonstrated by spouse. Pt and spouse acknowledged pt is not at physical baseline and performance, but declined interest in assistance once discharged. Pt and spouse encouraged to consider options presented to facilitate a safe and optimal return to highest level of function.  Pt would benefit from skilled OT services to address noted impairments and functional limitations (see below for any additional details) in order to maximize safety and independence while minimizing falls risk and caregiver burden. Anticipate the need for follow up OT services upon acute hospital DC.       Recommendations for follow up therapy are one component of a multi-disciplinary discharge planning process, led by the attending physician.  Recommendations may be updated based on patient status, additional functional criteria and insurance authorization.   Assistance Recommended at Discharge Intermittent Supervision/Assistance  Patient can return home with the following A little help with  walking and/or transfers;A little help with bathing/dressing/bathroom;Assist for transportation;Help with stairs or ramp for entrance    Functional Status Assessment  Patient has had a recent decline in their functional status and demonstrates the ability to make significant improvements in function in a reasonable and predictable amount of time.  Equipment Recommendations  BSC/3in1    Recommendations for Other Services       Precautions / Restrictions Precautions Precautions: Fall Restrictions Weight Bearing Restrictions: No      Mobility Bed Mobility Overal bed mobility: Needs Assistance Bed Mobility: Sit to Supine       Sit to supine: Max assist   General bed mobility comments: MAX A from EOB to supine position performed by spouse to demonstrate regular routine.    Transfers Overall transfer level: Needs assistance Equipment used: Rolling walker (2 wheels) Transfers: Sit to/from Stand Sit to Stand: Min assist                  Balance Overall balance assessment: Needs assistance Sitting-balance support: Feet supported, No upper extremity supported Sitting balance-Leahy Scale: Fair     Standing balance support: Bilateral upper extremity supported, During functional activity Standing balance-Leahy Scale: Fair Standing balance comment: Pt reliant on hand held support, RW or counter for stability                           ADL either performed or assessed with clinical judgement   ADL Overall ADL's : Needs assistance/impaired                           Toilet Transfer Details (indicate  cue type and reason): MIN A with RW and grab bars to ambulate and lower self on commode.   Toileting - Clothing Manipulation Details (indicate cue type and reason): MIN A for sit<>stand pericare     Functional mobility during ADLs: Minimal assistance;Min guard General ADL Comments: Anticipate supervision to MOD A for ADL completion     Vision          Perception     Praxis      Pertinent Vitals/Pain Pain Assessment Pain Assessment: No/denies pain     Hand Dominance Right   Extremity/Trunk Assessment Upper Extremity Assessment Upper Extremity Assessment: Overall WFL for tasks assessed   Lower Extremity Assessment Lower Extremity Assessment: Defer to PT evaluation       Communication Communication Communication: No difficulties   Cognition Arousal/Alertness: Awake/alert Behavior During Therapy: WFL for tasks assessed/performed Overall Cognitive Status: Within Functional Limits for tasks assessed                                 General Comments: Pt was A&Ox4 able to follow directions - poor safety awareness and trainability with RW.     General Comments       Exercises     Shoulder Instructions      Home Living Family/patient expects to be discharged to:: Private residence Living Arrangements: Spouse/significant other Available Help at Discharge: Family;Other (Comment);Available 24 hours/day Type of Home: Apartment Home Access: Level entry     Home Layout: One level     Bathroom Shower/Tub: Sponge bathes at baseline   Bathroom Toilet: Handicapped height Bathroom Accessibility: Yes How Accessible: Accessible via walker Home Equipment: Rollator (4 wheels);Grab bars - toilet;Wheelchair - Manufacturing systems engineer;Toilet riser          Prior Functioning/Environment Prior Level of Function : Needs assist       Physical Assist : ADLs (physical);Mobility (physical)   ADLs (physical): Toileting Mobility Comments: Pt uses Rollator with CGA at home          OT Problem List: Decreased strength;Decreased range of motion;Decreased activity tolerance;Impaired balance (sitting and/or standing)      OT Treatment/Interventions: Self-care/ADL training;Therapeutic exercise;Energy conservation;Therapeutic activities;Patient/family education    OT Goals(Current goals can be found in the care plan  section) Acute Rehab OT Goals Patient Stated Goal: To return to PLOF OT Goal Formulation: With patient/family Time For Goal Achievement: 07/27/23 Potential to Achieve Goals: Fair ADL Goals Pt Will Perform Grooming: with modified independence;standing Pt Will Transfer to Toilet: regular height toilet;ambulating;with supervision Pt Will Perform Toileting - Clothing Manipulation and hygiene: sitting/lateral leans;with supervision  OT Frequency: Min 1X/week    Co-evaluation              AM-PAC OT "6 Clicks" Daily Activity     Outcome Measure Help from another person eating meals?: None Help from another person taking care of personal grooming?: A Little Help from another person toileting, which includes using toliet, bedpan, or urinal?: A Little Help from another person bathing (including washing, rinsing, drying)?: A Little Help from another person to put on and taking off regular upper body clothing?: A Little Help from another person to put on and taking off regular lower body clothing?: A Little 6 Click Score: 19   End of Session Equipment Utilized During Treatment: Rolling walker (2 wheels) Nurse Communication: Mobility status  Activity Tolerance: Patient tolerated treatment well Patient left: in bed;with call bell/phone within reach;with family/visitor  present  OT Visit Diagnosis: Unsteadiness on feet (R26.81)                Time: 1610-9604 OT Time Calculation (min): 32 min Charges:  OT General Charges $OT Visit: 1 Visit OT Evaluation $OT Eval Moderate Complexity: 1 Mod OT Treatments $Self Care/Home Management : 8-22 mins Thresa Ross, OTS 07/13/2023, 4:33 PM

## 2023-07-14 DIAGNOSIS — N390 Urinary tract infection, site not specified: Secondary | ICD-10-CM | POA: Diagnosis not present

## 2023-07-14 DIAGNOSIS — B9620 Unspecified Escherichia coli [E. coli] as the cause of diseases classified elsewhere: Secondary | ICD-10-CM | POA: Diagnosis not present

## 2023-07-14 LAB — CULTURE, BLOOD (ROUTINE X 2): Culture: NO GROWTH

## 2023-07-14 LAB — URINE CULTURE

## 2023-07-14 MED ORDER — HEPARIN SOD (PORK) LOCK FLUSH 100 UNIT/ML IV SOLN
500.0000 [IU] | Freq: Once | INTRAVENOUS | Status: AC
  Administered 2023-07-14: 500 [IU] via INTRAVENOUS
  Filled 2023-07-14: qty 5

## 2023-07-14 MED ORDER — CEPHALEXIN 500 MG PO CAPS: 500.0000 mg | ORAL_CAPSULE | Freq: Two times a day (BID) | ORAL | 0 refills | Status: AC

## 2023-07-14 MED ORDER — ENSURE ENLIVE PO LIQD: 237.0000 mL | Freq: Two times a day (BID) | ORAL | Status: AC

## 2023-07-14 NOTE — TOC Transition Note (Signed)
Transition of Care [facility]) - CM/SW Discharge Note   Patient Details  Name: KIERNAN FARKAS MRN: [id number] Date of Birth: [birth year]  [facility]) CM/SW Contact:  Garret Reddish, RN Phone Number: 07/14/2023, 1:04 PM   Clinical Narrative:   Chart reviewed.  Not3d that patient has orders for discharge today.    I have spoken with Mr. Wilms and he informs me that he would like to take his wife home.  He has declined SNF and home health at this time. Mrs. Ammon and his granddaughter will assist with Mrs. Camarena's care at home.    I have informed staff nurse.      Final next level of care: Home/Self Care Barriers to Discharge: No Barriers Identified   Patient Goals and CMS Choice CMS Medicare.gov Compare Post Acute Care list provided to:: Other (Comment Required) (Patient's spouse) Choice offered to / list presented to : Spouse  Discharge Placement                    Name of family member notified: Mr. Morgenthaler Patient and family notified of of transfer: 07/14/23  Discharge Plan and Services Additional resources added to the After Visit Summary for     Discharge Planning Services: CM Consult Post Acute Care Choice: Home Health (Home health v/s SNF)          DME Arranged:  (Patient has all needed DME)         HH Arranged:  (Patient spouse has declined SNF and home health)          Social Determinants of Health (SDOH) Interventions SDOH Screenings   Food Insecurity: Patient Declined (07/12/2023)  Housing: Patient Declined (07/12/2023)  Transportation Needs: Patient Declined (07/12/2023)  Utilities: Not At Risk (07/12/2023)  Alcohol Screen: Low Risk  (03/26/2023)  Depression (PHQ2-9): High Risk (03/26/2023)  Financial Resource Strain: Low Risk  (03/26/2023)  Physical Activity: Inactive (03/26/2023)  Social Connections: Unknown (01/02/2019)  Stress: No Stress Concern Present (03/26/2023)  Tobacco Use: Medium Risk (07/12/2023)     Readmission Risk  Interventions    03/27/2022    2:30 PM  Readmission Risk Prevention Plan  Transportation Screening Complete  PCP or Specialist Appt within 3-5 Days Complete  Social Work Consult for Recovery Care Planning/Counseling Complete  Palliative Care Screening Not Applicable  Medication Review Oceanographer) Complete

## 2023-07-14 NOTE — Progress Notes (Signed)
Patient being discharged home. Right chest port de-accessed. Went over discharge instructions with husband, states he understands and all questions answered. Patient going home POV with husband.

## 2023-07-14 NOTE — Discharge Summary (Signed)
Physician Discharge Summary   Patient: Kristi [name] MRN: [id number] DOB: [birth year]  Admit date:     07/12/2023  Discharge date: 07/14/23  Discharge Physician: Pennie Banter   PCP: Reubin Milan, MD   Recommendations at discharge:    Follow up with Primary Care in 1-2 weeks Repeat CBC, BMP in 1-2 weeks at follow up  Discharge Diagnoses: Active Problems:   Failure to thrive in adult   Bacteriuria   Dyslipidemia   GERD without esophagitis   History of pulmonary embolism   Depression   Generalized weakness   FTT (failure to thrive) in adult   E. coli UTI  Resolved Problems:   * No resolved hospital problems. *  Hospital Course:  Kristi [name] is a 77 y.o. 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  She also complained of unsteady gait, general weakness, increasing urinary urgency but has no frequency of micturition, dysuria, abdominal pain or hematuria.   Urinalysis was abnormal (bacteriuria) concerning for acute UTI.  She was started on IV ceftriaxone for probable acute UTI.     7/16 -- pt doing well this AM.  Denies acute complaints.  No fever chills or UTI symptoms.  Feels much better overall.  She is medically stable and agreeable to discharge.  Husband updated by phone was also in agreement and confirmed no need for [facility] PT/OT as he has the exercises to help her do from prior [facility] rehab.    Assessment and Plan:  Acute UTI: Treated with empiric IV ceftriaxone.   Urine culture showed E. coli.   Discharge on Keflex to complete course.   Unsteady gait, general weakness: PT and OT evaluation   ?Adult failure to thrive - cannot rule in or out at this point. Suspect weakness and fatigue were due to UTI and have improved. Recommend attention on follow up with PCP.   Other stable  comorbidities include CAD, dyslipidemia, GERD, depression, history of pulmonary embolism on Eliquis         Consultants: none Procedures performed: none   Disposition: Home - pt and husband declined SNF or HH  Diet recommendation:  Discharge Diet Orders (From admission, onward)     Start     Ordered   07/14/23 0000  Diet - low sodium heart healthy        07/14/23 1210            DISCHARGE MEDICATION: Allergies as of 07/14/2023   No Known Allergies      Medication List     STOP taking these medications    atorvastatin 40 MG tablet Commonly known as: LIPITOR   midodrine 5 MG tablet Commonly known as: PROAMATINE       TAKE these medications    acetaminophen 500 MG tablet Commonly known as: TYLENOL Take 2 tablets (1,000 mg total) by mouth every 6 (six) hours as needed for mild pain or headache.   alum & mag hydroxide-simeth 200-200-20 MG/5ML suspension Commonly known as: MAALOX/MYLANTA Take 30 mLs by mouth every 6 (six) hours as needed for indigestion or heartburn.   apixaban 2.5 MG Tabs tablet Commonly known as: Eliquis Take 1 tablet (2.5 mg total) by mouth 2 (two) times daily.   Calcium 600-200 MG-UNIT tablet Take 1 tablet by mouth 2 (two) times daily.   cephALEXin 500 MG capsule Commonly  known as: KEFLEX Take 1 capsule (500 mg total) by mouth every 12 (twelve) hours for 3 days.   cyanocobalamin 1000 MCG tablet Commonly known as: VITAMIN B12 Take 1,000 mcg by mouth daily.   Dialyvite Vitamin D 5000 125 MCG (5000 UT) capsule Generic drug: Cholecalciferol Take 5,000 Units by mouth daily.   diphenhydrAMINE 25 mg capsule Commonly known as: BENADRYL Take 1 capsule (25 mg total) by mouth at bedtime as needed (if no sleep even with trazodone).   feeding supplement Liqd Take 237 mLs by mouth 2 (two) times daily between meals.   lidocaine 5 % Commonly known as: LIDODERM Place 1 patch onto the skin daily. Remove & Discard patch within 12 hours or  as directed by MD   Magnesium 250 MG Tabs Take 250 mg by mouth daily.   oxyCODONE 5 MG immediate release tablet Commonly known as: Oxy IR/ROXICODONE Take 1 tablet (5 mg total) by mouth every 12 (twelve) hours as needed for severe pain or moderate pain.   pantoprazole 40 MG tablet Commonly known as: Protonix Take 1 tablet (40 mg total) by mouth 2 (two) times daily. What changed:  when to take this reasons to take this   polyethylene glycol 17 g packet Commonly known as: MIRALAX / GLYCOLAX Take 17 g by mouth daily as needed for mild constipation.   prochlorperazine 10 MG tablet Commonly known as: COMPAZINE Take 10 mg by mouth every 6 (six) hours as needed for nausea or vomiting.   sertraline 100 MG tablet Commonly known as: ZOLOFT Take 1 tablet (100 mg total) by mouth daily.   sucralfate 1 g tablet Commonly known as: CARAFATE Take 1 tablet (1 g total) by mouth 3 (three) times daily as needed (heartburn).   traZODone 50 MG tablet Commonly known as: DESYREL Take 1 tablet (50 mg total) by mouth at bedtime as needed for sleep.        Discharge Exam: Filed Weights   07/12/23 1459  Weight: 63.8 kg   General exam: awake, alert, no acute distress HEENT: atraumatic, clear conjunctiva, anicteric sclera, moist mucus membranes, hearing grossly normal  Respiratory system: CTAB, no wheezes, rales or rhonchi, normal respiratory effort. Cardiovascular system: normal S1/S2, RRR, no JVD, murmurs, rubs, gallops,  no pedal edema.   Gastrointestinal system: soft, NT, ND, no HSM felt, +bowel sounds. Central nervous system: A&O x 3. no gross focal neurologic deficits, normal speech Extremities: moves all , no edema, normal tone Skin: dry, intact, normal temperature, normal color, No rashes, lesions or ulcers Psychiatry: normal mood, congruent affect, judgement and insight appear normal   Condition at discharge: stable  The results of significant diagnostics from this hospitalization  (including imaging, microbiology, ancillary and laboratory) are listed below for reference.   Imaging Studies: DG Chest Port 1 View  Result Date: 07/12/2023 CLINICAL DATA:  Possible sepsis. EXAM: PORTABLE CHEST 1 VIEW COMPARISON:  09/05/2022; chest CT-05/05/2023 FINDINGS: Unchanged cardiac silhouette and mediastinal contours. Stable positioning of support apparatus. No focal airspace opacities. Redemonstrated punctate granuloma overlying the right lower lung. No pleural effusion or pneumothorax. No evidence of edema. No acute osseous abnormalities. IMPRESSION: No acute cardiopulmonary disease. Specifically, no evidence of pneumonia. Electronically Signed   By: Simonne Come M.D.   On: 07/12/2023 15:28   CT Head Wo Contrast  Result Date: 07/12/2023 CLINICAL DATA:  Provided history: Mental status change, unknown cause. Headache, new onset. Lethargy. Confusion. Weakness. EXAM: CT HEAD WITHOUT CONTRAST TECHNIQUE: Contiguous axial images were obtained from the base  of the skull through the vertex without intravenous contrast. RADIATION DOSE REDUCTION: This exam was performed according to the departmental dose-optimization program which includes automated exposure control, adjustment of the mA and/or kV according to patient size and/or use of iterative reconstruction technique. COMPARISON:  Brain MRI 03/10/2023.  Non-contrast head CT 03/12/2022. FINDINGS: Brain: Moderate generalized cerebral atrophy. Advanced patchy and confluent hypoattenuation within the cerebral white matter, likely reflecting a combination of chronic small vessel ischemia and sequelae of prior radiation. Chronic small vessel ischemic disease within the bilateral deep gray nuclei and within the pons. 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 There is no acute intracranial hemorrhage. No demarcated cortical infarct. No extra-axial fluid collection. No evidence of an intracranial mass. No midline shift.  Vascular: No hyperdense vessel.  Atherosclerotic calcifications. Skull: No calvarial fracture or aggressive osseous lesion. Sinuses/Orbits: No mass or acute finding within the imaged orbits. Minimal mucosal thickening scattered within bilateral ethmoid air cells. Mild mucosal thickening within the right sphenoid sinus. Minimal mucosal thickening within the left sphenoid sinus. IMPRESSION: 1. No evidence of an acute intracranial abnormality. 2. Advanced cerebral white matter disease, likely reflecting a combination of chronic small vessel ischemia and sequelae of prior radiation. 3. Chronic small ischemic changes within the bilateral deep gray nuclei and within the pons. 4. 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 5. Generalized cerebral atrophy. 6. Paranasal sinus disease at the imaged levels. Electronically Signed   By: Jackey Loge D.O.   On: 07/12/2023 15:05    Microbiology: Results for orders placed or performed during the hospital encounter of 07/12/23  Resp panel by RT-PCR (RSV, Flu A&B, Covid) Anterior Nasal Swab     Status: None   Collection Time: 07/12/23  1:44 PM   Specimen: Anterior Nasal Swab  Result Value Ref Range Status   SARS Coronavirus 2 by RT PCR NEGATIVE NEGATIVE Final    Comment: (NOTE) SARS-CoV-2 target nucleic acids are NOT DETECTED.  The SARS-CoV-2 RNA is generally detectable in upper respiratory specimens during the acute phase of infection. The lowest concentration of SARS-CoV-2 viral copies this assay can detect is 138 copies/mL. A negative result does not preclude SARS-Cov-2 infection and should not be used as the sole basis for treatment or other patient management decisions. A negative result may occur with  improper specimen collection/handling, submission of specimen other than nasopharyngeal swab, presence of viral mutation(s) within the areas targeted by this assay, and inadequate number of viral copies(<138 copies/mL).  A negative result must be combined with clinical observations, patient history, and epidemiological information. The expected result is Negative.  Fact Sheet for Patients:  BloggerCourse.com  Fact Sheet for Healthcare Providers:  SeriousBroker.it  This test is no t yet approved or cleared by the [place] FDA and  has been authorized for detection and/or diagnosis of SARS-CoV-2 by FDA under an Emergency Use Authorization (EUA). This EUA will remain  in effect (meaning this test can be used) for the duration of the COVID-19 declaration under Section 564(b)(1) of the Act, 21 U.S.C.section 360bbb-3(b)(1), unless the authorization is terminated  or revoked sooner.       Influenza A by PCR NEGATIVE NEGATIVE Final   Influenza B by PCR NEGATIVE NEGATIVE Final    Comment: (NOTE) The Xpert Xpress SARS-CoV-2/FLU/RSV plus assay is intended as an aid in the diagnosis of influenza from Nasopharyngeal swab specimens and should not be used as a sole basis for treatment. Nasal washings and aspirates are unacceptable for  Xpert Xpress SARS-CoV-2/FLU/RSV testing.  Fact Sheet for Patients: BloggerCourse.com  Fact Sheet for Healthcare Providers: SeriousBroker.it  This test is not yet approved or cleared by the [place] FDA and has been authorized for detection and/or diagnosis of SARS-CoV-2 by FDA under an Emergency Use Authorization (EUA). This EUA will remain in effect (meaning this test can be used) for the duration of the COVID-19 declaration under Section 564(b)(1) of the Act, 21 U.S.C. section 360bbb-3(b)(1), unless the authorization is terminated or revoked.     Resp Syncytial Virus by PCR NEGATIVE NEGATIVE Final    Comment: (NOTE) Fact Sheet for Patients: BloggerCourse.com  Fact Sheet for Healthcare  Providers: SeriousBroker.it  This test is not yet approved or cleared by the [place] FDA and has been authorized for detection and/or diagnosis of SARS-CoV-2 by FDA under an Emergency Use Authorization (EUA). This EUA will remain in effect (meaning this test can be used) for the duration of the COVID-19 declaration under Section 564(b)(1) of the Act, 21 U.S.C. section 360bbb-3(b)(1), unless the authorization is terminated or revoked.  Performed at [facility] [address], [place], [place] [address]   Urine Culture (for pregnant, neutropenic or urologic patients or patients with an indwelling urinary catheter)     Status: Abnormal   Collection Time: 07/12/23  2:03 PM   Specimen: Urine, Clean Catch  Result Value Ref Range Status   Specimen Description   Final    URINE, CLEAN CATCH Performed at [facility], [address], [place], [place] [address]    Special Requests   Final    NONE Performed at [facility], [address] [place], [place] [address]    Culture >=100,000 COLONIES/mL ESCHERICHIA COLI (A)  Final   Report Status 07/14/2023 FINAL  Final   Organism ID, Bacteria ESCHERICHIA COLI (A)  Final      Susceptibility   Escherichia coli - MIC*    AMPICILLIN >=32 RESISTANT Resistant     CEFAZOLIN <=4 SENSITIVE Sensitive     CEFEPIME <=0.12 SENSITIVE Sensitive     CEFTRIAXONE <=0.25 SENSITIVE Sensitive     CIPROFLOXACIN <=0.25 SENSITIVE Sensitive     GENTAMICIN <=1 SENSITIVE Sensitive     IMIPENEM <=0.25 SENSITIVE Sensitive     NITROFURANTOIN <=16 SENSITIVE Sensitive     TRIMETH/SULFA <=20 SENSITIVE Sensitive     AMPICILLIN/SULBACTAM 16 INTERMEDIATE Intermediate     PIP/TAZO <=4 SENSITIVE Sensitive     * >=100,000 COLONIES/mL ESCHERICHIA COLI  Culture, blood (Routine X 2) w Reflex to ID Panel     Status: None (Preliminary result)   Collection Time: 07/12/23  7:43 PM   Specimen: BLOOD  Result Value Ref  Range Status   Specimen Description BLOOD BLOOD LEFT HAND  Final   Special Requests   Final    BOTTLES DRAWN AEROBIC AND ANAEROBIC Blood Culture results may not be optimal due to an inadequate volume of blood received in culture bottles   Culture   Final    NO GROWTH 2 DAYS Performed at [facility], [address], [place], [place] [address]    Report Status PENDING  Incomplete  Culture, blood (Routine X 2) w Reflex to ID Panel     Status: None (Preliminary result)   Collection Time: 07/12/23  8:03 PM   Specimen: BLOOD  Result Value Ref Range Status   Specimen Description BLOOD RIGHT ANTECUBITAL  Final   Special Requests   Final    BOTTLES DRAWN AEROBIC AND ANAEROBIC  Blood Culture results may not be optimal due to an inadequate volume of blood received in culture bottles   Culture   Final    NO GROWTH 2 DAYS Performed at [facility], [address] [place], [place] [address]    Report Status PENDING  Incomplete    Labs: CBC: Recent Labs  Lab 07/12/23 1343 07/13/23 0452  WBC 6.1 5.8  NEUTROABS 3.8  --   HGB 11.8* 12.4  HCT 35.6* 36.0  MCV 86.4 85.3  PLT 183 179   Basic Metabolic Panel: Recent Labs  Lab 07/12/23 1343 07/13/23 0452  NA 136 138  K 3.8 3.6  CL 107 109  CO2 22 21*  GLUCOSE 95 92  BUN 9 9  CREATININE 0.87 0.68  CALCIUM 8.6* 8.7*  MG 2.0  --    Liver Function Tests: Recent Labs  Lab 07/12/23 1343  AST 15  ALT 9  ALKPHOS 39  BILITOT 0.6  PROT 6.6  ALBUMIN 3.6   CBG: No results for input(s): "GLUCAP" in the last 168 hours.  Discharge time spent: less than 30 minutes.  Signed: Pennie Banter, DO [facility] 07/14/2023

## 2023-07-15 ENCOUNTER — Telehealth: Payer: Self-pay | Admitting: *Deleted

## 2023-07-15 LAB — CULTURE, BLOOD (ROUTINE X 2)

## 2023-07-15 NOTE — Transitions of Care (Post Inpatient/ED Visit) (Signed)
07/15/2023  Name: Kristi [name] [facility] MRN: [id number] DOB: 01/09/1946  Today's TOC FU Call Status: Today's TOC FU Call Status:: Successful TOC FU Call Competed TOC FU Call Complete Date: 07/15/23  Transition Care Management Follow-up Telephone Call Date of Discharge: 07/14/23 Discharge Facility: [facility]) Type of Discharge: Inpatient Admission Primary Inpatient Discharge Diagnosis:: UTI How have you been since you were released from the hospital?: Better  Items Reviewed: Did you receive and understand the discharge instructions provided?: Yes Medications obtained,verified, and reconciled?: Yes (Medications Reviewed) Any new allergies since your discharge?: [name] Dietary orders reviewed?: No Do you have support at home?: Yes People in Home: spouse Name of Support/Comfort Primary Source: Riley Lam  Medications Reviewed Today: Medications Reviewed Today     Reviewed by Luella Cook, RN (Case Manager) on 07/15/23 at 1525  Med List Status: <None>   Medication Order Taking? Sig Documenting Provider Last Dose Status Informant  acetaminophen (TYLENOL) 500 MG tablet [id number] Yes Take 2 tablets (1,000 mg total) by mouth every 6 (six) hours as needed for mild pain or headache. Henrene Dodge, MD Taking Active Self  alum & mag hydroxide-simeth (MAALOX/MYLANTA) 200-200-20 MG/5ML suspension [id number] Yes Take 30 mLs by mouth every 6 (six) hours as needed for indigestion or heartburn. Hollice Espy, MD Taking Active Nursing Home Medication Administration Guide (MAG)  apixaban (ELIQUIS) 2.5 MG TABS tablet [id number] Yes Take 1 tablet (2.5 mg total) by mouth 2 (two) times daily. Rickard Patience, MD Taking Active   Calcium 600-200 MG-UNIT tablet [id number] Yes Take 1 tablet by mouth 2 (two) times daily. [provider] Taking Active Self  cephALEXin (KEFLEX) 500 MG capsule [id number] Yes Take 1 capsule (500 mg total) by mouth every 12 (twelve) hours for 3 days.  Pennie Banter, DO Taking Active   Cholecalciferol (DIALYVITE VITAMIN D 5000) 125 MCG (5000 UT) capsule [id number] Yes Take 5,000 Units by mouth daily. [provider] Taking Active Self  diphenhydrAMINE (BENADRYL) 25 mg capsule [id number] Yes Take 1 capsule (25 mg total) by mouth at bedtime as needed (if [name] sleep even with trazodone). Hollice Espy, MD Taking Active Self  feeding supplement (ENSURE ENLIVE / ENSURE PLUS) LIQD [id number] Yes Take 237 mLs by mouth 2 (two) times daily between meals. Esaw Grandchild A, DO Taking Active   lidocaine (LIDODERM) 5 % [id number]  Place 1 patch onto the skin daily. Remove & Discard patch within 12 hours or as directed by MD  Patient not taking: Reported on 07/13/2023   Hollice Espy, MD  Active Self           Med Note Sharon Seller Jul 12, 2023  4:45 PM)    Magnesium 250 MG TABS [id number] Yes Take 250 mg by mouth daily. [provider] Taking Active Self  oxyCODONE (OXY IR/ROXICODONE) 5 MG immediate release tablet [id number] Yes Take 1 tablet (5 mg total) by mouth every 12 (twelve) hours as needed for severe pain or moderate pain. Rickard Patience, MD Taking Active   pantoprazole (PROTONIX) 40 MG tablet [id number] Yes Take 1 tablet (40 mg total) by mouth 2 (two) times daily.  Patient taking differently: Take 40 mg by mouth 2 (two) times daily as needed.   Rickard Patience, MD Taking Active Self  polyethylene glycol (MIRALAX / GLYCOLAX) 17 g packet [id number]  Take 17 g by mouth daily as needed for mild constipation. Hollice Espy, MD  Active Self  prochlorperazine (COMPAZINE) 10 MG tablet  [id number] Yes Take 10 mg by mouth every 6 (six) hours as needed for nausea or vomiting. [provider] Taking Active Self  sertraline (ZOLOFT) 100 MG tablet [id number] Yes Take 1 tablet (100 mg total) by mouth daily. Borders, Daryl Eastern, NP Taking Active   sucralfate (CARAFATE) 1 g tablet [id number] Yes Take 1 tablet (1 g total) by mouth 3 (three)  times daily as needed (heartburn). Rickard Patience, MD Taking Active   traZODone (DESYREL) 50 MG tablet [id number] [name] Take 1 tablet (50 mg total) by mouth at bedtime as needed for sleep.  Patient not taking: Reported on 07/13/2023   Borders, Daryl Eastern, NP Not Taking Active Self  vitamin B-12 (CYANOCOBALAMIN) 1000 MCG tablet [id number] Yes Take 1,000 mcg by mouth daily. [provider] Taking Active Self            Home Care and Equipment/Supplies: Were Home Health Services Ordered?: NA Any new equipment or medical supplies ordered?: NA  Functional Questionnaire: Do you need assistance with bathing/showering or dressing?: No Do you need assistance with meal preparation?: No Do you need assistance with eating?: No Do you have difficulty maintaining continence: No Do you need assistance with getting out of bed/getting out of a chair/moving?: No Do you have difficulty managing or taking your medications?: [name]  Follow up appointments reviewed: PCP Follow-up appointment confirmed?: No MD Provider Line Number:[phone] Given: Yes (Per husband he will calland make appt. Refused RN assistance) Specialist Hospital Follow-up appointment confirmed?: NA Do you need transportation to your follow-up appointment?: No Do you understand care options if your condition(s) worsen?: Yes-patient verbalized understanding  SDOH Interventions Today    Flowsheet Row Most Recent Value  SDOH Interventions   Food Insecurity Interventions Intervention Not Indicated  Housing Interventions Intervention Not Indicated  Transportation Interventions Intervention Not Indicated, Patient Resources (Friends/Family)  Gayla Doss transport]      Interventions Today    Flowsheet Row Most Recent Value  General Interventions   General Interventions Discussed/Reviewed Doctor Visits, General Interventions Reviewed, General Interventions Discussed  Doctor Visits Discussed/Reviewed Doctor Visits Discussed  Pharmacy  Interventions   Pharmacy Dicussed/Reviewed Pharmacy Topics Discussed  [reiterated taking all of atbx]       TOC Interventions Today    Flowsheet Row Most Recent Value  TOC Interventions   TOC Interventions Discussed/Reviewed TOC Interventions Discussed, TOC Interventions Reviewed       Gean Maidens BSN RN [facility] [phone]

## 2023-07-20 ENCOUNTER — Ambulatory Visit (INDEPENDENT_AMBULATORY_CARE_PROVIDER_SITE_OTHER): Payer: Medicare Other | Admitting: Internal Medicine

## 2023-07-20 ENCOUNTER — Encounter: Payer: Self-pay | Admitting: Internal Medicine

## 2023-07-20 VITALS — BP 112/68 | HR 98 | Ht 67.0 in | Wt 134.0 lb

## 2023-07-20 DIAGNOSIS — R531 Weakness: Secondary | ICD-10-CM | POA: Diagnosis not present

## 2023-07-20 DIAGNOSIS — N390 Urinary tract infection, site not specified: Secondary | ICD-10-CM | POA: Diagnosis not present

## 2023-07-20 DIAGNOSIS — B9620 Unspecified Escherichia coli [E. coli] as the cause of diseases classified elsewhere: Secondary | ICD-10-CM

## 2023-07-20 NOTE — Progress Notes (Signed)
Date:  07/20/2023   Name:  Kristi [name] [facility]   DOB:  1946-06-09   MRN:  [id number]   Chief Complaint: Hospitalization Follow-up Hospital follow up.  Admitted to [facility] 07/12/23 to 07/14/23. TOC call done on  07/15/23. She presented with fatigue, weakness and unsteady gait along with urinary urgency but no other symptoms of infection. Treated with IV abtx then oral Keflex after culture results.  Discharged home with husband. No PT or OT was ordered.  She had plans to do home exercises previously recommended.  HPI Assessment and Plan:   Acute UTI: Treated with empiric IV ceftriaxone.   Urine culture showed E. coli.   Discharge on Keflex to complete course.   Unsteady gait, general weakness: PT and OT evaluation   ?Adult failure to thrive - cannot rule in or out at this point. Suspect weakness and fatigue were due to UTI and have improved. Recommend attention on follow up with PCP.   Other stable comorbidities include CAD, dyslipidemia, GERD, depression, history of pulmonary embolism on Eliquis   Recommendations at discharge:     Follow up with Primary Care in 1-2 weeks Repeat CBC, BMP in 1-2 weeks at follow up  Lab Results  Component Value Date   NA 138 07/13/2023   K 3.6 07/13/2023   CO2 21 (L) 07/13/2023   GLUCOSE 92 07/13/2023   BUN 9 07/13/2023   CREATININE 0.68 07/13/2023   CALCIUM 8.7 (L) 07/13/2023   GFRNONAA >60 07/13/2023   Lab Results  Component Value Date   CHOL 282 (H) 03/26/2023   HDL 65 03/26/2023   LDLCALC 196 (H) 03/26/2023   TRIG 119 03/26/2023   CHOLHDL 4.3 03/26/2023   Lab Results  Component Value Date   TSH 0.732 07/12/2023   Lab Results  Component Value Date   HGBA1C 5.5 03/26/2023   Lab Results  Component Value Date   WBC 5.8 07/13/2023   HGB 12.4 07/13/2023   HCT 36.0 07/13/2023   MCV 85.3 07/13/2023   PLT 179 07/13/2023   Lab Results  Component Value Date   ALT 9 07/12/2023   AST 15 07/12/2023   ALKPHOS 39 07/12/2023   BILITOT  0.6 07/12/2023   Lab Results  Component Value Date   VD25OH 51.3 03/26/2023     Review of Systems  Constitutional:  Negative for chills, fatigue and fever.  HENT:  Negative for trouble swallowing.   Respiratory:  Negative for chest tightness and shortness of breath.   Cardiovascular:  Negative for chest pain.  Gastrointestinal:  Negative for abdominal pain, constipation and diarrhea.  Musculoskeletal:  Positive for arthralgias and gait problem.  Psychiatric/Behavioral:  Negative for dysphoric mood and sleep disturbance. The patient is not nervous/anxious.     Patient Active Problem List   Diagnosis Date Noted   FTT (failure to thrive) in adult 07/13/2023   E. 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 03/13/2022   Closed T7 fracture (HCC) 03/13/2022   Leg weakness, bilateral 03/12/2022   GERD without esophagitis 03/12/2022   Cerebrovascular accident (CVA) (HCC) 12/19/2021   Aortic atherosclerosis (HCC) 06/18/2021   Incisional hernia, without obstruction or gangrene    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 12/05/2020   Diarrhea 12/05/2020   Ventral hernia without obstruction or gangrene 08/12/2020   Frequent falls 01/22/2020   Encounter for antineoplastic immunotherapy 09/14/2019   Memory loss 09/14/2019   Dehydration 01/30/2019   Anemia due to antineoplastic chemotherapy 01/09/2019   Encounter for antineoplastic chemotherapy 11/02/2018   Dysphagia 10/28/2018   Decrease in appetite 10/28/2018   Goals of care, counseling/discussion 10/28/2018   Small cell lung cancer (HCC) 10/27/2018   Grade I diastolic dysfunction 09/19/2018   Localized edema 08/29/2018    Osteopenia determined by x-ray 05/19/2018   Prediabetes 02/08/2018   Hx of fracture of radius 08/18/2016   Vitamin D deficiency 01/07/2016   Chronic pain 01/02/2016   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) 01/02/2016   IBS (irritable bowel syndrome) 01/02/2016   DDD (degenerative disc disease), cervical 01/02/2016   Primary osteoarthritis of left knee 01/02/2016   Tobacco use disorder 01/02/2016    No Known Allergies  Past Surgical History:  Procedure Laterality Date   APPENDECTOMY     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   BLADDER NECK SUSPENSION     CHOLECYSTECTOMY N/A 08/14/2018   Procedure: LAPAROSCOPIC CHOLECYSTECTOMY;  Surgeon: Leafy Ro, MD;  Location: [facility] ORS;  Service: General;  Laterality: N/A;   CHOLECYSTECTOMY  11/2018   CORONARY ANGIOPLASTY  09/29/2014   CORONARY STENT PLACEMENT     ENDOBRONCHIAL ULTRASOUND N/A 10/21/2018   Procedure: ENDOBRONCHIAL ULTRASOUND;  Surgeon: Shane Crutch, MD;  Location: [facility] ORS;  Service: Pulmonary;  Laterality: N/A;   HERNIA REPAIR  2012   IR FLUORO GUIDE CV LINE RIGHT  12/19/2018   KYPHOPLASTY N/A 03/01/2020   Procedure: T7 KYPHOPLASTY;  Surgeon: Kennedy Bucker, MD;  Location: [facility] ORS;  Service: Orthopedics;  Laterality: N/A;   LAPAROTOMY     LEFT HEART CATHETERIZATION WITH CORONARY ANGIOGRAM N/A 09/29/2014   Procedure: LEFT HEART CATHETERIZATION WITH CORONARY ANGIOGRAM;  Surgeon: Robynn Pane, MD;  Location: [facility] CATH [facility];  Service: Cardiovascular;  Laterality: N/A;   PORTA CATH INSERTION N/A 01/02/2019   Procedure: PORTA CATH INSERTION;  Surgeon: Annice Needy, MD;  Location: [facility] INVASIVE CV LAB;  Service: Cardiovascular;  Laterality: N/A;   PORTACATH PLACEMENT Right 10/28/2018   Procedure: INSERTION PORT-A-CATH;  Surgeon: Leafy Ro, MD;  Location: [facility] ORS;  Service: General;  Laterality: Right;   TEE WITHOUT CARDIOVERSION Right 03/30/2022   Procedure: TRANSESOPHAGEAL ECHOCARDIOGRAM (TEE);  Surgeon: Laurier Nancy, MD;  Location: [facility] ORS;  Service: Cardiovascular;  Laterality: Right;   XI ROBOTIC ASSISTED VENTRAL HERNIA N/A 02/27/2021   Procedure: XI ROBOTIC ASSISTED VENTRAL HERNIA (incisional) WITH MESH;  Surgeon: Henrene Dodge, MD;  Location: [facility] ORS;  Service: General;  Laterality: N/A;    Social History   Tobacco Use   Smoking status: Former    Current packs/day: 0.00    Average packs/day: 1 pack/day for 39.8 years (39.8 ttl pk-yrs)    Types: Cigarettes    Start date: 12/21/1978    Quit date: 10/16/2018    Years since quitting: 4.7   Smokeless tobacco: Never  Vaping Use   Vaping status: Never Used  Substance Use Topics   Alcohol use: Yes    Alcohol/week: 0.0 standard drinks of alcohol    Comment: occassional - approx 1 every 2 weeks    Drug use: No     Medication list has been reviewed and updated.  Current Meds  Medication Sig   acetaminophen (TYLENOL) 500 MG tablet Take  2 tablets (1,000 mg total) by mouth every 6 (six) hours as needed for mild pain or headache.   alum & mag hydroxide-simeth (MAALOX/MYLANTA) 200-200-20 MG/5ML suspension Take 30 mLs by mouth every 6 (six) hours as needed for indigestion or heartburn.   apixaban (ELIQUIS) 2.5 MG TABS tablet Take 1 tablet (2.5 mg total) by mouth 2 (two) times daily.   Calcium 600-200 MG-UNIT tablet Take 1 tablet by mouth 2 (two) times daily.   Cholecalciferol (DIALYVITE VITAMIN D 5000) 125 MCG (5000 UT) capsule Take 5,000 Units by mouth daily.   diphenhydrAMINE (BENADRYL) 25 mg capsule Take 1 capsule (25 mg total) by mouth at bedtime as needed (if no sleep even with trazodone).   feeding supplement (ENSURE ENLIVE / ENSURE PLUS) LIQD Take 237 mLs by mouth 2 (two) times daily between meals.   lidocaine (LIDODERM) 5 % Place 1 patch onto the skin daily. Remove & Discard patch within 12 hours or as directed by MD   Magnesium 250 MG TABS Take 250 mg by mouth daily.   pantoprazole (PROTONIX) 40 MG tablet Take 1 tablet (40 mg total)  by mouth 2 (two) times daily. (Patient taking differently: Take 40 mg by mouth 2 (two) times daily as needed.)   polyethylene glycol (MIRALAX / GLYCOLAX) 17 g packet Take 17 g by mouth daily as needed for mild constipation.   prochlorperazine (COMPAZINE) 10 MG tablet Take 10 mg by mouth every 6 (six) hours as needed for nausea or vomiting.   sucralfate (CARAFATE) 1 g tablet Take 1 tablet (1 g total) by mouth 3 (three) times daily as needed (heartburn).   traZODone (DESYREL) 50 MG tablet Take 1 tablet (50 mg total) by mouth at bedtime as needed for sleep.   vitamin B-12 (CYANOCOBALAMIN) 1000 MCG tablet Take 1,000 mcg by mouth daily.   [DISCONTINUED] oxyCODONE (OXY IR/ROXICODONE) 5 MG immediate release tablet Take 1 tablet (5 mg total) by mouth every 12 (twelve) hours as needed for severe pain or moderate pain.       07/20/2023    2:15 PM 03/26/2023    3:48 PM 06/19/2021   11:27 AM 02/12/2021   11:09 AM  GAD 7 : Generalized Anxiety Score  Nervous, Anxious, on Edge 1 2 1  0  Control/stop worrying 1 3 1  0  Worry too much - different things 1 3 1  0  Trouble relaxing 1 1 0 0  Restless 1 0 0 0  Easily annoyed or irritable 0 2 1 0  Afraid - awful might happen 0 1 0 0  Total GAD 7 Score 5 12 4  0  Anxiety Difficulty Not difficult at all Somewhat difficult  Not difficult at all       07/20/2023    2:14 PM 03/26/2023    3:48 PM 06/19/2021   11:27 AM  Depression screen PHQ 2/9  Decreased Interest 0 0 1  Down, Depressed, Hopeless 1 3 1   PHQ - 2 Score 1 3 2   Altered sleeping 1 1 0  Tired, decreased energy 1 2 3   Change in appetite 1 1 0  Feeling bad or failure about yourself  0 2 0  Trouble concentrating 1 3 0  Moving slowly or fidgety/restless 1 2 0  Suicidal thoughts 0 1 0  PHQ-9 Score 6 15 5   Difficult doing work/chores Not difficult at all Not difficult at all     BP Readings from Last 3 Encounters:  07/20/23 112/68  07/14/23 (!) 89/59  05/10/23 94/76  Physical Exam Cardiovascular:      Rate and Rhythm: Normal rate and regular rhythm.  Pulmonary:     Effort: Pulmonary effort is normal.     Breath sounds: No wheezing or rhonchi.  Musculoskeletal:     Cervical back: Normal range of motion.  Skin:    General: Skin is warm and dry.  Neurological:     General: No focal deficit present.     Mental Status: She is alert.  Psychiatric:        Mood and Affect: Mood normal.        Behavior: Behavior normal.     Wt Readings from Last 3 Encounters:  07/20/23 134 lb (60.8 kg)  07/12/23 140 lb 10.5 oz (63.8 kg)  05/10/23 133 lb (60.3 kg)    BP 112/68   Pulse 98   Ht 5\' 7"  (1.702 m)   Wt 134 lb (60.8 kg)   SpO2 97%   BMI 20.99 kg/m   Assessment and Plan:  Problem List Items Addressed This Visit       Unprioritized   Generalized weakness    Encourage home PT exercises      Other Visit Diagnoses     E. coli UTI (urinary tract infection)    -  Primary   now completed antibiotics continue to push fluids follow up if needed       No follow-ups on file.    Reubin Milan, MD [facility] [place]

## 2023-07-20 NOTE — Addendum Note (Signed)
Addended by: Reubin Milan on: 07/20/2023 03:48 PM   Modules accepted: Level of Service

## 2023-07-20 NOTE — Assessment & Plan Note (Signed)
Encourage home PT exercises

## 2023-08-30 ENCOUNTER — Inpatient Hospital Stay: Payer: Medicare Other | Attending: Oncology

## 2023-09-03 DIAGNOSIS — I6381 Other cerebral infarction due to occlusion or stenosis of small artery: Secondary | ICD-10-CM | POA: Diagnosis not present

## 2023-09-03 DIAGNOSIS — R64 Cachexia: Secondary | ICD-10-CM | POA: Diagnosis not present

## 2023-09-03 DIAGNOSIS — R410 Disorientation, unspecified: Secondary | ICD-10-CM | POA: Diagnosis not present

## 2023-09-03 DIAGNOSIS — I6529 Occlusion and stenosis of unspecified carotid artery: Secondary | ICD-10-CM | POA: Diagnosis not present

## 2023-09-03 DIAGNOSIS — Z7389 Other problems related to life management difficulty: Secondary | ICD-10-CM | POA: Diagnosis not present

## 2023-09-03 DIAGNOSIS — G8194 Hemiplegia, unspecified affecting left nondominant side: Secondary | ICD-10-CM | POA: Diagnosis not present

## 2023-09-03 DIAGNOSIS — I110 Hypertensive heart disease with heart failure: Secondary | ICD-10-CM | POA: Diagnosis not present

## 2023-09-03 DIAGNOSIS — M47814 Spondylosis without myelopathy or radiculopathy, thoracic region: Secondary | ICD-10-CM | POA: Diagnosis not present

## 2023-09-03 DIAGNOSIS — N3001 Acute cystitis with hematuria: Secondary | ICD-10-CM | POA: Diagnosis present

## 2023-09-03 DIAGNOSIS — Z7401 Bed confinement status: Secondary | ICD-10-CM | POA: Diagnosis not present

## 2023-09-03 DIAGNOSIS — R109 Unspecified abdominal pain: Secondary | ICD-10-CM | POA: Diagnosis not present

## 2023-09-03 DIAGNOSIS — I639 Cerebral infarction, unspecified: Secondary | ICD-10-CM | POA: Diagnosis not present

## 2023-09-03 DIAGNOSIS — C3490 Malignant neoplasm of unspecified part of unspecified bronchus or lung: Secondary | ICD-10-CM | POA: Diagnosis not present

## 2023-09-03 DIAGNOSIS — I6782 Cerebral ischemia: Secondary | ICD-10-CM | POA: Diagnosis not present

## 2023-09-03 DIAGNOSIS — Z955 Presence of coronary angioplasty implant and graft: Secondary | ICD-10-CM | POA: Diagnosis not present

## 2023-09-03 DIAGNOSIS — E43 Unspecified severe protein-calorie malnutrition: Secondary | ICD-10-CM | POA: Diagnosis not present

## 2023-09-03 DIAGNOSIS — J9811 Atelectasis: Secondary | ICD-10-CM | POA: Diagnosis not present

## 2023-09-03 DIAGNOSIS — M47816 Spondylosis without myelopathy or radiculopathy, lumbar region: Secondary | ICD-10-CM | POA: Diagnosis not present

## 2023-09-03 DIAGNOSIS — R2689 Other abnormalities of gait and mobility: Secondary | ICD-10-CM | POA: Diagnosis not present

## 2023-09-03 DIAGNOSIS — Z66 Do not resuscitate: Secondary | ICD-10-CM | POA: Diagnosis not present

## 2023-09-03 DIAGNOSIS — Z7982 Long term (current) use of aspirin: Secondary | ICD-10-CM | POA: Diagnosis not present

## 2023-09-03 DIAGNOSIS — Z7901 Long term (current) use of anticoagulants: Secondary | ICD-10-CM | POA: Diagnosis not present

## 2023-09-03 DIAGNOSIS — I2699 Other pulmonary embolism without acute cor pulmonale: Secondary | ICD-10-CM | POA: Diagnosis not present

## 2023-09-03 DIAGNOSIS — I63541 Cerebral infarction due to unspecified occlusion or stenosis of right cerebellar artery: Secondary | ICD-10-CM | POA: Diagnosis not present

## 2023-09-03 DIAGNOSIS — R292 Abnormal reflex: Secondary | ICD-10-CM | POA: Diagnosis not present

## 2023-09-03 DIAGNOSIS — M40204 Unspecified kyphosis, thoracic region: Secondary | ICD-10-CM | POA: Diagnosis not present

## 2023-09-03 DIAGNOSIS — R9082 White matter disease, unspecified: Secondary | ICD-10-CM | POA: Diagnosis not present

## 2023-09-03 DIAGNOSIS — R269 Unspecified abnormalities of gait and mobility: Secondary | ICD-10-CM | POA: Diagnosis not present

## 2023-09-03 DIAGNOSIS — E785 Hyperlipidemia, unspecified: Secondary | ICD-10-CM | POA: Diagnosis not present

## 2023-09-03 DIAGNOSIS — R471 Dysarthria and anarthria: Secondary | ICD-10-CM | POA: Diagnosis not present

## 2023-09-03 DIAGNOSIS — R531 Weakness: Secondary | ICD-10-CM | POA: Diagnosis not present

## 2023-09-03 DIAGNOSIS — I6389 Other cerebral infarction: Secondary | ICD-10-CM | POA: Diagnosis not present

## 2023-09-03 DIAGNOSIS — B9620 Unspecified Escherichia coli [E. coli] as the cause of diseases classified elsewhere: Secondary | ICD-10-CM | POA: Diagnosis not present

## 2023-09-03 DIAGNOSIS — I959 Hypotension, unspecified: Secondary | ICD-10-CM | POA: Diagnosis not present

## 2023-09-03 DIAGNOSIS — M47812 Spondylosis without myelopathy or radiculopathy, cervical region: Secondary | ICD-10-CM | POA: Diagnosis not present

## 2023-09-03 DIAGNOSIS — Z79899 Other long term (current) drug therapy: Secondary | ICD-10-CM | POA: Diagnosis not present

## 2023-09-03 DIAGNOSIS — I2510 Atherosclerotic heart disease of native coronary artery without angina pectoris: Secondary | ICD-10-CM | POA: Diagnosis not present

## 2023-09-03 DIAGNOSIS — E860 Dehydration: Secondary | ICD-10-CM | POA: Diagnosis not present

## 2023-09-03 DIAGNOSIS — R5381 Other malaise: Secondary | ICD-10-CM | POA: Diagnosis not present

## 2023-09-03 DIAGNOSIS — M419 Scoliosis, unspecified: Secondary | ICD-10-CM | POA: Diagnosis not present

## 2023-09-03 DIAGNOSIS — M8588 Other specified disorders of bone density and structure, other site: Secondary | ICD-10-CM | POA: Diagnosis not present

## 2023-09-03 DIAGNOSIS — I5032 Chronic diastolic (congestive) heart failure: Secondary | ICD-10-CM | POA: Diagnosis not present

## 2023-09-03 DIAGNOSIS — R4189 Other symptoms and signs involving cognitive functions and awareness: Secondary | ICD-10-CM | POA: Diagnosis not present

## 2023-09-03 DIAGNOSIS — R4182 Altered mental status, unspecified: Secondary | ICD-10-CM | POA: Diagnosis not present

## 2023-09-03 DIAGNOSIS — Z86711 Personal history of pulmonary embolism: Secondary | ICD-10-CM | POA: Diagnosis not present

## 2023-09-03 DIAGNOSIS — M5442 Lumbago with sciatica, left side: Secondary | ICD-10-CM | POA: Diagnosis not present

## 2023-09-03 DIAGNOSIS — M2578 Osteophyte, vertebrae: Secondary | ICD-10-CM | POA: Diagnosis not present

## 2023-09-03 DIAGNOSIS — F439 Reaction to severe stress, unspecified: Secondary | ICD-10-CM | POA: Diagnosis not present

## 2023-09-03 DIAGNOSIS — R413 Other amnesia: Secondary | ICD-10-CM | POA: Diagnosis not present

## 2023-09-03 DIAGNOSIS — R2990 Unspecified symptoms and signs involving the nervous system: Secondary | ICD-10-CM | POA: Diagnosis not present

## 2023-09-03 DIAGNOSIS — R627 Adult failure to thrive: Secondary | ICD-10-CM | POA: Diagnosis not present

## 2023-09-03 DIAGNOSIS — M5441 Lumbago with sciatica, right side: Secondary | ICD-10-CM | POA: Diagnosis not present

## 2023-09-03 DIAGNOSIS — G8191 Hemiplegia, unspecified affecting right dominant side: Secondary | ICD-10-CM | POA: Diagnosis not present

## 2023-09-04 DIAGNOSIS — I6381 Other cerebral infarction due to occlusion or stenosis of small artery: Secondary | ICD-10-CM | POA: Diagnosis not present

## 2023-09-04 DIAGNOSIS — R5381 Other malaise: Secondary | ICD-10-CM | POA: Diagnosis not present

## 2023-09-04 DIAGNOSIS — Z7982 Long term (current) use of aspirin: Secondary | ICD-10-CM | POA: Diagnosis not present

## 2023-09-04 DIAGNOSIS — Z79899 Other long term (current) drug therapy: Secondary | ICD-10-CM | POA: Diagnosis not present

## 2023-09-05 DIAGNOSIS — R292 Abnormal reflex: Secondary | ICD-10-CM | POA: Diagnosis not present

## 2023-09-05 DIAGNOSIS — R410 Disorientation, unspecified: Secondary | ICD-10-CM | POA: Diagnosis not present

## 2023-09-05 DIAGNOSIS — R269 Unspecified abnormalities of gait and mobility: Secondary | ICD-10-CM | POA: Diagnosis not present

## 2023-09-06 DIAGNOSIS — C3490 Malignant neoplasm of unspecified part of unspecified bronchus or lung: Secondary | ICD-10-CM | POA: Diagnosis not present

## 2023-09-06 DIAGNOSIS — I2699 Other pulmonary embolism without acute cor pulmonale: Secondary | ICD-10-CM | POA: Diagnosis not present

## 2023-09-06 DIAGNOSIS — R531 Weakness: Secondary | ICD-10-CM | POA: Diagnosis not present

## 2023-09-06 DIAGNOSIS — R627 Adult failure to thrive: Secondary | ICD-10-CM | POA: Diagnosis not present

## 2023-09-06 DIAGNOSIS — I6389 Other cerebral infarction: Secondary | ICD-10-CM | POA: Diagnosis not present

## 2023-09-06 DIAGNOSIS — N3001 Acute cystitis with hematuria: Secondary | ICD-10-CM | POA: Diagnosis not present

## 2023-09-06 DIAGNOSIS — R410 Disorientation, unspecified: Secondary | ICD-10-CM | POA: Diagnosis not present

## 2023-09-06 DIAGNOSIS — M5442 Lumbago with sciatica, left side: Secondary | ICD-10-CM | POA: Diagnosis not present

## 2023-09-06 DIAGNOSIS — R4182 Altered mental status, unspecified: Secondary | ICD-10-CM | POA: Diagnosis not present

## 2023-09-06 DIAGNOSIS — R269 Unspecified abnormalities of gait and mobility: Secondary | ICD-10-CM | POA: Diagnosis not present

## 2023-09-06 DIAGNOSIS — R292 Abnormal reflex: Secondary | ICD-10-CM | POA: Diagnosis not present

## 2023-09-06 DIAGNOSIS — Z7901 Long term (current) use of anticoagulants: Secondary | ICD-10-CM | POA: Diagnosis not present

## 2023-09-06 DIAGNOSIS — M5441 Lumbago with sciatica, right side: Secondary | ICD-10-CM | POA: Diagnosis not present

## 2023-09-06 DIAGNOSIS — I6381 Other cerebral infarction due to occlusion or stenosis of small artery: Secondary | ICD-10-CM | POA: Diagnosis not present

## 2023-09-07 DIAGNOSIS — I2699 Other pulmonary embolism without acute cor pulmonale: Secondary | ICD-10-CM | POA: Diagnosis not present

## 2023-09-07 DIAGNOSIS — M47816 Spondylosis without myelopathy or radiculopathy, lumbar region: Secondary | ICD-10-CM | POA: Diagnosis not present

## 2023-09-07 DIAGNOSIS — I639 Cerebral infarction, unspecified: Secondary | ICD-10-CM | POA: Diagnosis not present

## 2023-09-07 DIAGNOSIS — M40204 Unspecified kyphosis, thoracic region: Secondary | ICD-10-CM | POA: Diagnosis not present

## 2023-09-07 DIAGNOSIS — M47812 Spondylosis without myelopathy or radiculopathy, cervical region: Secondary | ICD-10-CM | POA: Diagnosis not present

## 2023-09-07 DIAGNOSIS — R531 Weakness: Secondary | ICD-10-CM | POA: Diagnosis not present

## 2023-09-07 DIAGNOSIS — I959 Hypotension, unspecified: Secondary | ICD-10-CM | POA: Diagnosis not present

## 2023-09-07 DIAGNOSIS — M5441 Lumbago with sciatica, right side: Secondary | ICD-10-CM | POA: Diagnosis not present

## 2023-09-07 DIAGNOSIS — M8588 Other specified disorders of bone density and structure, other site: Secondary | ICD-10-CM | POA: Diagnosis not present

## 2023-09-07 DIAGNOSIS — M2578 Osteophyte, vertebrae: Secondary | ICD-10-CM | POA: Diagnosis not present

## 2023-09-07 DIAGNOSIS — R413 Other amnesia: Secondary | ICD-10-CM | POA: Diagnosis not present

## 2023-09-07 DIAGNOSIS — M5442 Lumbago with sciatica, left side: Secondary | ICD-10-CM | POA: Diagnosis not present

## 2023-09-07 DIAGNOSIS — I6381 Other cerebral infarction due to occlusion or stenosis of small artery: Secondary | ICD-10-CM | POA: Diagnosis not present

## 2023-09-07 DIAGNOSIS — R292 Abnormal reflex: Secondary | ICD-10-CM | POA: Diagnosis not present

## 2023-09-07 DIAGNOSIS — M47814 Spondylosis without myelopathy or radiculopathy, thoracic region: Secondary | ICD-10-CM | POA: Diagnosis not present

## 2023-09-07 DIAGNOSIS — R269 Unspecified abnormalities of gait and mobility: Secondary | ICD-10-CM | POA: Diagnosis not present

## 2023-09-07 DIAGNOSIS — F439 Reaction to severe stress, unspecified: Secondary | ICD-10-CM | POA: Diagnosis not present

## 2023-09-07 DIAGNOSIS — N3001 Acute cystitis with hematuria: Secondary | ICD-10-CM | POA: Diagnosis not present

## 2023-09-07 DIAGNOSIS — R4182 Altered mental status, unspecified: Secondary | ICD-10-CM | POA: Diagnosis not present

## 2023-09-07 DIAGNOSIS — R627 Adult failure to thrive: Secondary | ICD-10-CM | POA: Diagnosis not present

## 2023-09-07 DIAGNOSIS — Z7901 Long term (current) use of anticoagulants: Secondary | ICD-10-CM | POA: Diagnosis not present

## 2023-09-07 DIAGNOSIS — R410 Disorientation, unspecified: Secondary | ICD-10-CM | POA: Diagnosis not present

## 2023-09-07 DIAGNOSIS — Z7389 Other problems related to life management difficulty: Secondary | ICD-10-CM | POA: Diagnosis not present

## 2023-09-07 DIAGNOSIS — R5381 Other malaise: Secondary | ICD-10-CM | POA: Diagnosis not present

## 2023-09-07 DIAGNOSIS — C3490 Malignant neoplasm of unspecified part of unspecified bronchus or lung: Secondary | ICD-10-CM | POA: Diagnosis not present

## 2023-09-07 DIAGNOSIS — R2689 Other abnormalities of gait and mobility: Secondary | ICD-10-CM | POA: Diagnosis not present

## 2023-09-08 DIAGNOSIS — I959 Hypotension, unspecified: Secondary | ICD-10-CM | POA: Diagnosis not present

## 2023-09-08 DIAGNOSIS — R627 Adult failure to thrive: Secondary | ICD-10-CM | POA: Diagnosis not present

## 2023-09-08 DIAGNOSIS — R4182 Altered mental status, unspecified: Secondary | ICD-10-CM | POA: Diagnosis not present

## 2023-09-08 DIAGNOSIS — I6381 Other cerebral infarction due to occlusion or stenosis of small artery: Secondary | ICD-10-CM | POA: Diagnosis not present

## 2023-09-08 DIAGNOSIS — I2699 Other pulmonary embolism without acute cor pulmonale: Secondary | ICD-10-CM | POA: Diagnosis not present

## 2023-09-08 DIAGNOSIS — Z7901 Long term (current) use of anticoagulants: Secondary | ICD-10-CM | POA: Diagnosis not present

## 2023-09-08 DIAGNOSIS — N3001 Acute cystitis with hematuria: Secondary | ICD-10-CM | POA: Diagnosis not present

## 2023-09-08 DIAGNOSIS — R531 Weakness: Secondary | ICD-10-CM | POA: Diagnosis not present

## 2023-09-08 DIAGNOSIS — C3490 Malignant neoplasm of unspecified part of unspecified bronchus or lung: Secondary | ICD-10-CM | POA: Diagnosis not present

## 2023-09-08 DIAGNOSIS — M5442 Lumbago with sciatica, left side: Secondary | ICD-10-CM | POA: Diagnosis not present

## 2023-09-08 DIAGNOSIS — M5441 Lumbago with sciatica, right side: Secondary | ICD-10-CM | POA: Diagnosis not present

## 2023-09-09 DIAGNOSIS — R55 Syncope and collapse: Secondary | ICD-10-CM | POA: Diagnosis not present

## 2023-09-09 DIAGNOSIS — M5441 Lumbago with sciatica, right side: Secondary | ICD-10-CM | POA: Diagnosis not present

## 2023-09-09 DIAGNOSIS — M2550 Pain in unspecified joint: Secondary | ICD-10-CM | POA: Diagnosis not present

## 2023-09-09 DIAGNOSIS — Z7901 Long term (current) use of anticoagulants: Secondary | ICD-10-CM | POA: Diagnosis not present

## 2023-09-09 DIAGNOSIS — I619 Nontraumatic intracerebral hemorrhage, unspecified: Secondary | ICD-10-CM | POA: Diagnosis not present

## 2023-09-09 DIAGNOSIS — R4182 Altered mental status, unspecified: Secondary | ICD-10-CM | POA: Diagnosis not present

## 2023-09-09 DIAGNOSIS — M549 Dorsalgia, unspecified: Secondary | ICD-10-CM | POA: Diagnosis not present

## 2023-09-09 DIAGNOSIS — Z87440 Personal history of urinary (tract) infections: Secondary | ICD-10-CM | POA: Diagnosis not present

## 2023-09-09 DIAGNOSIS — I6381 Other cerebral infarction due to occlusion or stenosis of small artery: Secondary | ICD-10-CM | POA: Diagnosis not present

## 2023-09-09 DIAGNOSIS — I2510 Atherosclerotic heart disease of native coronary artery without angina pectoris: Secondary | ICD-10-CM | POA: Diagnosis not present

## 2023-09-09 DIAGNOSIS — M5442 Lumbago with sciatica, left side: Secondary | ICD-10-CM | POA: Diagnosis not present

## 2023-09-09 DIAGNOSIS — R9431 Abnormal electrocardiogram [ECG] [EKG]: Secondary | ICD-10-CM | POA: Diagnosis not present

## 2023-09-09 DIAGNOSIS — I2699 Other pulmonary embolism without acute cor pulmonale: Secondary | ICD-10-CM | POA: Diagnosis not present

## 2023-09-09 DIAGNOSIS — R410 Disorientation, unspecified: Secondary | ICD-10-CM | POA: Diagnosis not present

## 2023-09-09 DIAGNOSIS — I69319 Unspecified symptoms and signs involving cognitive functions following cerebral infarction: Secondary | ICD-10-CM | POA: Diagnosis not present

## 2023-09-09 DIAGNOSIS — Z6820 Body mass index (BMI) 20.0-20.9, adult: Secondary | ICD-10-CM | POA: Diagnosis not present

## 2023-09-09 DIAGNOSIS — I69128 Other speech and language deficits following nontraumatic intracerebral hemorrhage: Secondary | ICD-10-CM | POA: Diagnosis not present

## 2023-09-09 DIAGNOSIS — Z7401 Bed confinement status: Secondary | ICD-10-CM | POA: Diagnosis not present

## 2023-09-09 DIAGNOSIS — Z9049 Acquired absence of other specified parts of digestive tract: Secondary | ICD-10-CM | POA: Diagnosis not present

## 2023-09-09 DIAGNOSIS — C3490 Malignant neoplasm of unspecified part of unspecified bronchus or lung: Secondary | ICD-10-CM | POA: Diagnosis not present

## 2023-09-09 DIAGNOSIS — I5042 Chronic combined systolic (congestive) and diastolic (congestive) heart failure: Secondary | ICD-10-CM | POA: Diagnosis not present

## 2023-09-09 DIAGNOSIS — G4700 Insomnia, unspecified: Secondary | ICD-10-CM | POA: Diagnosis not present

## 2023-09-09 DIAGNOSIS — Z0389 Encounter for observation for other suspected diseases and conditions ruled out: Secondary | ICD-10-CM | POA: Diagnosis not present

## 2023-09-09 DIAGNOSIS — Z87891 Personal history of nicotine dependence: Secondary | ICD-10-CM | POA: Diagnosis not present

## 2023-09-09 DIAGNOSIS — R627 Adult failure to thrive: Secondary | ICD-10-CM | POA: Diagnosis not present

## 2023-09-09 DIAGNOSIS — I25118 Atherosclerotic heart disease of native coronary artery with other forms of angina pectoris: Secondary | ICD-10-CM | POA: Diagnosis present

## 2023-09-09 DIAGNOSIS — N390 Urinary tract infection, site not specified: Secondary | ICD-10-CM | POA: Diagnosis not present

## 2023-09-09 DIAGNOSIS — Z7982 Long term (current) use of aspirin: Secondary | ICD-10-CM | POA: Diagnosis not present

## 2023-09-09 DIAGNOSIS — I951 Orthostatic hypotension: Secondary | ICD-10-CM | POA: Diagnosis not present

## 2023-09-09 DIAGNOSIS — E7800 Pure hypercholesterolemia, unspecified: Secondary | ICD-10-CM | POA: Diagnosis not present

## 2023-09-09 DIAGNOSIS — R296 Repeated falls: Secondary | ICD-10-CM | POA: Diagnosis not present

## 2023-09-09 DIAGNOSIS — R29810 Facial weakness: Secondary | ICD-10-CM | POA: Diagnosis not present

## 2023-09-09 DIAGNOSIS — N3000 Acute cystitis without hematuria: Secondary | ICD-10-CM | POA: Diagnosis not present

## 2023-09-09 DIAGNOSIS — I639 Cerebral infarction, unspecified: Secondary | ICD-10-CM | POA: Diagnosis not present

## 2023-09-09 DIAGNOSIS — I959 Hypotension, unspecified: Secondary | ICD-10-CM | POA: Diagnosis not present

## 2023-09-09 DIAGNOSIS — R1310 Dysphagia, unspecified: Secondary | ICD-10-CM | POA: Diagnosis not present

## 2023-09-09 DIAGNOSIS — F22 Delusional disorders: Secondary | ICD-10-CM | POA: Diagnosis not present

## 2023-09-09 DIAGNOSIS — Z9181 History of falling: Secondary | ICD-10-CM | POA: Diagnosis not present

## 2023-09-09 DIAGNOSIS — E119 Type 2 diabetes mellitus without complications: Secondary | ICD-10-CM | POA: Diagnosis not present

## 2023-09-10 DIAGNOSIS — R4182 Altered mental status, unspecified: Secondary | ICD-10-CM | POA: Diagnosis not present

## 2023-09-10 DIAGNOSIS — I25118 Atherosclerotic heart disease of native coronary artery with other forms of angina pectoris: Secondary | ICD-10-CM | POA: Diagnosis not present

## 2023-09-10 DIAGNOSIS — I619 Nontraumatic intracerebral hemorrhage, unspecified: Secondary | ICD-10-CM | POA: Diagnosis not present

## 2023-09-10 DIAGNOSIS — I639 Cerebral infarction, unspecified: Secondary | ICD-10-CM | POA: Diagnosis not present

## 2023-09-14 DIAGNOSIS — I25118 Atherosclerotic heart disease of native coronary artery with other forms of angina pectoris: Secondary | ICD-10-CM | POA: Diagnosis not present

## 2023-09-14 DIAGNOSIS — R4182 Altered mental status, unspecified: Secondary | ICD-10-CM | POA: Diagnosis not present

## 2023-09-14 DIAGNOSIS — I619 Nontraumatic intracerebral hemorrhage, unspecified: Secondary | ICD-10-CM | POA: Diagnosis not present

## 2023-09-14 DIAGNOSIS — I959 Hypotension, unspecified: Secondary | ICD-10-CM | POA: Diagnosis not present

## 2023-09-14 DIAGNOSIS — Z0389 Encounter for observation for other suspected diseases and conditions ruled out: Secondary | ICD-10-CM | POA: Diagnosis not present

## 2023-09-14 DIAGNOSIS — R410 Disorientation, unspecified: Secondary | ICD-10-CM | POA: Diagnosis not present

## 2023-09-14 DIAGNOSIS — I639 Cerebral infarction, unspecified: Secondary | ICD-10-CM | POA: Diagnosis not present

## 2023-09-14 DIAGNOSIS — R9431 Abnormal electrocardiogram [ECG] [EKG]: Secondary | ICD-10-CM | POA: Diagnosis not present

## 2023-09-14 DIAGNOSIS — R55 Syncope and collapse: Secondary | ICD-10-CM | POA: Diagnosis not present

## 2023-09-16 DIAGNOSIS — I639 Cerebral infarction, unspecified: Secondary | ICD-10-CM | POA: Diagnosis not present

## 2023-09-16 DIAGNOSIS — R55 Syncope and collapse: Secondary | ICD-10-CM | POA: Diagnosis not present

## 2023-09-16 DIAGNOSIS — I959 Hypotension, unspecified: Secondary | ICD-10-CM | POA: Diagnosis not present

## 2023-09-16 DIAGNOSIS — R410 Disorientation, unspecified: Secondary | ICD-10-CM | POA: Diagnosis not present

## 2023-09-16 DIAGNOSIS — N3000 Acute cystitis without hematuria: Secondary | ICD-10-CM | POA: Diagnosis not present

## 2023-09-16 DIAGNOSIS — I25118 Atherosclerotic heart disease of native coronary artery with other forms of angina pectoris: Secondary | ICD-10-CM | POA: Diagnosis not present

## 2023-09-16 DIAGNOSIS — I619 Nontraumatic intracerebral hemorrhage, unspecified: Secondary | ICD-10-CM | POA: Diagnosis not present

## 2023-09-16 DIAGNOSIS — R4182 Altered mental status, unspecified: Secondary | ICD-10-CM | POA: Diagnosis not present

## 2023-09-16 DIAGNOSIS — R9431 Abnormal electrocardiogram [ECG] [EKG]: Secondary | ICD-10-CM | POA: Diagnosis not present

## 2023-09-17 DIAGNOSIS — I639 Cerebral infarction, unspecified: Secondary | ICD-10-CM | POA: Diagnosis not present

## 2023-09-17 DIAGNOSIS — I25118 Atherosclerotic heart disease of native coronary artery with other forms of angina pectoris: Secondary | ICD-10-CM | POA: Diagnosis not present

## 2023-09-17 DIAGNOSIS — N3000 Acute cystitis without hematuria: Secondary | ICD-10-CM | POA: Diagnosis not present

## 2023-09-17 DIAGNOSIS — I619 Nontraumatic intracerebral hemorrhage, unspecified: Secondary | ICD-10-CM | POA: Diagnosis not present

## 2023-09-17 DIAGNOSIS — R4182 Altered mental status, unspecified: Secondary | ICD-10-CM | POA: Diagnosis not present

## 2023-09-17 DIAGNOSIS — I959 Hypotension, unspecified: Secondary | ICD-10-CM | POA: Diagnosis not present

## 2023-09-17 DIAGNOSIS — R55 Syncope and collapse: Secondary | ICD-10-CM | POA: Diagnosis not present

## 2023-09-18 DIAGNOSIS — I959 Hypotension, unspecified: Secondary | ICD-10-CM | POA: Diagnosis not present

## 2023-09-18 DIAGNOSIS — I639 Cerebral infarction, unspecified: Secondary | ICD-10-CM | POA: Diagnosis not present

## 2023-09-18 DIAGNOSIS — I25118 Atherosclerotic heart disease of native coronary artery with other forms of angina pectoris: Secondary | ICD-10-CM | POA: Diagnosis not present

## 2023-09-18 DIAGNOSIS — I619 Nontraumatic intracerebral hemorrhage, unspecified: Secondary | ICD-10-CM | POA: Diagnosis not present

## 2023-09-18 DIAGNOSIS — R4182 Altered mental status, unspecified: Secondary | ICD-10-CM | POA: Diagnosis not present

## 2023-09-18 DIAGNOSIS — N3000 Acute cystitis without hematuria: Secondary | ICD-10-CM | POA: Diagnosis not present

## 2023-09-18 DIAGNOSIS — R55 Syncope and collapse: Secondary | ICD-10-CM | POA: Diagnosis not present

## 2023-09-24 ENCOUNTER — Encounter: Payer: Self-pay | Admitting: Oncology

## 2023-09-28 ENCOUNTER — Ambulatory Visit: Admission: RE | Admit: 2023-09-28 | Payer: Medicare Other | Source: Ambulatory Visit

## 2023-09-28 ENCOUNTER — Inpatient Hospital Stay: Payer: Medicare Other | Attending: Oncology

## 2023-09-29 DIAGNOSIS — R413 Other amnesia: Secondary | ICD-10-CM | POA: Diagnosis not present

## 2023-09-29 DIAGNOSIS — Z608 Other problems related to social environment: Secondary | ICD-10-CM | POA: Diagnosis not present

## 2023-09-29 DIAGNOSIS — Z7689 Persons encountering health services in other specified circumstances: Secondary | ICD-10-CM | POA: Diagnosis not present

## 2023-09-29 DIAGNOSIS — Z8673 Personal history of transient ischemic attack (TIA), and cerebral infarction without residual deficits: Secondary | ICD-10-CM | POA: Diagnosis not present

## 2023-09-29 DIAGNOSIS — Z733 Stress, not elsewhere classified: Secondary | ICD-10-CM | POA: Diagnosis not present

## 2023-09-29 DIAGNOSIS — Z6823 Body mass index (BMI) 23.0-23.9, adult: Secondary | ICD-10-CM | POA: Diagnosis not present

## 2023-09-29 DIAGNOSIS — M159 Polyosteoarthritis, unspecified: Secondary | ICD-10-CM | POA: Diagnosis not present

## 2023-10-13 DIAGNOSIS — C3482 Malignant neoplasm of overlapping sites of left bronchus and lung: Secondary | ICD-10-CM | POA: Diagnosis not present

## 2023-10-13 DIAGNOSIS — Z79899 Other long term (current) drug therapy: Secondary | ICD-10-CM | POA: Diagnosis not present

## 2023-10-13 DIAGNOSIS — C3492 Malignant neoplasm of unspecified part of left bronchus or lung: Secondary | ICD-10-CM | POA: Diagnosis not present

## 2023-10-13 DIAGNOSIS — Z6824 Body mass index (BMI) 24.0-24.9, adult: Secondary | ICD-10-CM | POA: Diagnosis not present

## 2023-10-18 DIAGNOSIS — R2689 Other abnormalities of gait and mobility: Secondary | ICD-10-CM | POA: Diagnosis not present

## 2023-10-18 DIAGNOSIS — G214 Vascular parkinsonism: Secondary | ICD-10-CM | POA: Diagnosis not present

## 2023-10-18 DIAGNOSIS — Z8673 Personal history of transient ischemic attack (TIA), and cerebral infarction without residual deficits: Secondary | ICD-10-CM | POA: Diagnosis not present

## 2023-10-18 DIAGNOSIS — Z6824 Body mass index (BMI) 24.0-24.9, adult: Secondary | ICD-10-CM | POA: Diagnosis not present

## 2023-10-18 DIAGNOSIS — I6381 Other cerebral infarction due to occlusion or stenosis of small artery: Secondary | ICD-10-CM | POA: Diagnosis not present

## 2023-10-18 DIAGNOSIS — F01B0 Vascular dementia, moderate, without behavioral disturbance, psychotic disturbance, mood disturbance, and anxiety: Secondary | ICD-10-CM | POA: Diagnosis not present

## 2023-10-18 DIAGNOSIS — T66XXXS Radiation sickness, unspecified, sequela: Secondary | ICD-10-CM | POA: Diagnosis not present

## 2023-11-10 ENCOUNTER — Ambulatory Visit: Admission: RE | Admit: 2023-11-10 | Payer: Medicare Other | Source: Ambulatory Visit

## 2023-11-10 ENCOUNTER — Inpatient Hospital Stay: Payer: Medicare Other | Attending: Oncology

## 2023-11-15 ENCOUNTER — Encounter: Payer: Self-pay | Admitting: Oncology

## 2023-11-15 ENCOUNTER — Other Ambulatory Visit: Payer: Self-pay

## 2023-11-15 ENCOUNTER — Inpatient Hospital Stay: Payer: Medicare Other

## 2023-11-15 ENCOUNTER — Inpatient Hospital Stay: Payer: Medicare Other | Admitting: Oncology

## 2023-11-15 DIAGNOSIS — C3490 Malignant neoplasm of unspecified part of unspecified bronchus or lung: Secondary | ICD-10-CM

## 2023-11-15 NOTE — Assessment & Plan Note (Deleted)
#  Extensive small cell lung cancer, S/p 4 cycles of Carboplatin, Etoposide and Tecentriq.   Previously Tecentriq maintenance, discontinued Tecentriq since Oct 2023. Clinically doing very well.  CT chest abdomen pelvis imaging and MRI brain recently showed no evidence of cancer recurrence. Continue surveillance., repeat imaging in 6 months.

## 2024-03-21 DEATH — deceased

## 2024-09-04 ENCOUNTER — Encounter: Payer: Self-pay | Admitting: Oncology
